# Patient Record
Sex: Male | Born: 1945 | Race: White | Hispanic: No | Marital: Married | Smoking: Former smoker
Health system: Southern US, Community
[De-identification: ages and names within clinical notes are randomized; demographics above are authoritative.]

## PROBLEM LIST (undated history)

## (undated) DIAGNOSIS — Z86010 Personal history of colonic polyps: Secondary | ICD-10-CM

## (undated) DIAGNOSIS — N289 Disorder of kidney and ureter, unspecified: Secondary | ICD-10-CM

## (undated) DIAGNOSIS — E785 Hyperlipidemia, unspecified: Secondary | ICD-10-CM

## (undated) DIAGNOSIS — Z992 Dependence on renal dialysis: Secondary | ICD-10-CM

## (undated) DIAGNOSIS — Z87442 Personal history of urinary calculi: Secondary | ICD-10-CM

## (undated) DIAGNOSIS — M1990 Unspecified osteoarthritis, unspecified site: Secondary | ICD-10-CM

## (undated) DIAGNOSIS — M14671 Charcot's joint, right ankle and foot: Secondary | ICD-10-CM

## (undated) DIAGNOSIS — N186 End stage renal disease: Secondary | ICD-10-CM

## (undated) DIAGNOSIS — H3581 Retinal edema: Secondary | ICD-10-CM

## (undated) DIAGNOSIS — Z9989 Dependence on other enabling machines and devices: Secondary | ICD-10-CM

## (undated) DIAGNOSIS — H3530 Unspecified macular degeneration: Secondary | ICD-10-CM

## (undated) DIAGNOSIS — G4733 Obstructive sleep apnea (adult) (pediatric): Secondary | ICD-10-CM

## (undated) DIAGNOSIS — H269 Unspecified cataract: Secondary | ICD-10-CM

## (undated) DIAGNOSIS — I480 Paroxysmal atrial fibrillation: Secondary | ICD-10-CM

## (undated) DIAGNOSIS — I509 Heart failure, unspecified: Secondary | ICD-10-CM

## (undated) DIAGNOSIS — IMO0001 Reserved for inherently not codable concepts without codable children: Secondary | ICD-10-CM

## (undated) DIAGNOSIS — R55 Syncope and collapse: Secondary | ICD-10-CM

## (undated) DIAGNOSIS — I5030 Unspecified diastolic (congestive) heart failure: Secondary | ICD-10-CM

## (undated) DIAGNOSIS — Q780 Osteogenesis imperfecta: Secondary | ICD-10-CM

## (undated) DIAGNOSIS — I2510 Atherosclerotic heart disease of native coronary artery without angina pectoris: Secondary | ICD-10-CM

## (undated) DIAGNOSIS — I10 Essential (primary) hypertension: Secondary | ICD-10-CM

## (undated) DIAGNOSIS — C801 Malignant (primary) neoplasm, unspecified: Secondary | ICD-10-CM

## (undated) HISTORY — DX: Heart failure, unspecified: I50.9

## (undated) HISTORY — PX: COLONOSCOPY: SHX174

## (undated) HISTORY — DX: Essential (primary) hypertension: I10

## (undated) HISTORY — DX: Personal history of colonic polyps: Z86.010

## (undated) HISTORY — DX: Osteogenesis imperfecta: Q78.0

## (undated) HISTORY — DX: Disorder of kidney and ureter, unspecified: N28.9

## (undated) HISTORY — DX: Charcot's joint, right ankle and foot: M14.671

## (undated) HISTORY — DX: Obstructive sleep apnea (adult) (pediatric): G47.33

## (undated) HISTORY — DX: Unspecified cataract: H26.9

## (undated) HISTORY — DX: Dependence on other enabling machines and devices: Z99.89

## (undated) HISTORY — PX: PILONIDAL CYST EXCISION: SHX744

## (undated) HISTORY — DX: Retinal edema: H35.81

## (undated) HISTORY — PX: CARPAL TUNNEL RELEASE: SHX101

## (undated) HISTORY — DX: Atherosclerotic heart disease of native coronary artery without angina pectoris: I25.10

## (undated) HISTORY — DX: Hyperlipidemia, unspecified: E78.5

## (undated) HISTORY — DX: Paroxysmal atrial fibrillation: I48.0

---

## 2003-01-28 ENCOUNTER — Encounter: Admission: RE | Admit: 2003-01-28 | Discharge: 2003-04-28 | Payer: Self-pay | Admitting: *Deleted

## 2005-08-29 ENCOUNTER — Ambulatory Visit: Payer: Self-pay | Admitting: Family Medicine

## 2005-10-16 ENCOUNTER — Ambulatory Visit: Payer: Self-pay | Admitting: Family Medicine

## 2005-11-23 ENCOUNTER — Encounter: Payer: Self-pay | Admitting: Family Medicine

## 2005-11-24 ENCOUNTER — Encounter: Payer: Self-pay | Admitting: Family Medicine

## 2005-11-24 LAB — CONVERTED CEMR LAB
HDL: 41 mg/dL
LDL Cholesterol: 48 mg/dL

## 2005-12-05 ENCOUNTER — Ambulatory Visit: Payer: Self-pay | Admitting: Family Medicine

## 2006-01-16 ENCOUNTER — Ambulatory Visit: Payer: Self-pay | Admitting: Family Medicine

## 2006-01-23 ENCOUNTER — Encounter: Payer: Self-pay | Admitting: Family Medicine

## 2006-01-23 LAB — CONVERTED CEMR LAB
RBC count: 4.89 10*6/uL
WBC, blood: 6.2 10*3/uL

## 2006-03-08 ENCOUNTER — Ambulatory Visit: Payer: Self-pay | Admitting: Family Medicine

## 2006-05-17 ENCOUNTER — Ambulatory Visit: Payer: Self-pay | Admitting: Family Medicine

## 2006-08-10 ENCOUNTER — Ambulatory Visit: Payer: Self-pay | Admitting: Family Medicine

## 2006-08-28 DIAGNOSIS — N184 Chronic kidney disease, stage 4 (severe): Secondary | ICD-10-CM

## 2006-08-28 DIAGNOSIS — I10 Essential (primary) hypertension: Secondary | ICD-10-CM | POA: Insufficient documentation

## 2006-08-28 DIAGNOSIS — E1122 Type 2 diabetes mellitus with diabetic chronic kidney disease: Secondary | ICD-10-CM | POA: Insufficient documentation

## 2006-09-13 ENCOUNTER — Ambulatory Visit: Payer: Self-pay | Admitting: Family Medicine

## 2006-09-19 ENCOUNTER — Encounter: Payer: Self-pay | Admitting: Family Medicine

## 2006-09-19 LAB — CONVERTED CEMR LAB
Creatinine, Ser: 1.58 mg/dL — ABNORMAL HIGH (ref 0.40–1.50)
Sodium, Ur: 104 meq/L
Sodium: 134 meq/L — ABNORMAL LOW (ref 135–145)

## 2006-10-02 ENCOUNTER — Encounter: Payer: Self-pay | Admitting: Family Medicine

## 2006-10-05 ENCOUNTER — Encounter: Payer: Self-pay | Admitting: Family Medicine

## 2006-10-05 DIAGNOSIS — G4733 Obstructive sleep apnea (adult) (pediatric): Secondary | ICD-10-CM | POA: Insufficient documentation

## 2006-10-08 ENCOUNTER — Encounter: Payer: Self-pay | Admitting: Family Medicine

## 2006-10-09 ENCOUNTER — Telehealth: Payer: Self-pay | Admitting: Family Medicine

## 2006-10-22 ENCOUNTER — Telehealth: Payer: Self-pay | Admitting: Family Medicine

## 2006-10-23 ENCOUNTER — Encounter: Payer: Self-pay | Admitting: Family Medicine

## 2006-10-23 DIAGNOSIS — R809 Proteinuria, unspecified: Secondary | ICD-10-CM | POA: Insufficient documentation

## 2006-10-23 DIAGNOSIS — N186 End stage renal disease: Secondary | ICD-10-CM | POA: Insufficient documentation

## 2007-02-20 ENCOUNTER — Ambulatory Visit: Payer: Self-pay | Admitting: Family Medicine

## 2007-02-20 LAB — CONVERTED CEMR LAB

## 2007-02-22 ENCOUNTER — Encounter: Payer: Self-pay | Admitting: Family Medicine

## 2007-02-22 LAB — CONVERTED CEMR LAB
CO2: 18 meq/L — ABNORMAL LOW (ref 19–32)
Calcium: 9.4 mg/dL (ref 8.4–10.5)
Cholesterol: 107 mg/dL (ref 0–200)
Glucose, Bld: 106 mg/dL — ABNORMAL HIGH (ref 70–99)
Sodium: 140 meq/L (ref 135–145)
Total Bilirubin: 2 mg/dL — ABNORMAL HIGH (ref 0.3–1.2)
Total Protein: 7.2 g/dL (ref 6.0–8.3)
Triglycerides: 81 mg/dL (ref <150)
VLDL: 16 mg/dL (ref 0–40)

## 2007-02-25 ENCOUNTER — Encounter: Payer: Self-pay | Admitting: Family Medicine

## 2007-03-28 ENCOUNTER — Encounter: Payer: Self-pay | Admitting: Family Medicine

## 2007-03-28 ENCOUNTER — Encounter: Admission: RE | Admit: 2007-03-28 | Discharge: 2007-06-26 | Payer: Self-pay | Admitting: Family Medicine

## 2007-04-30 ENCOUNTER — Telehealth: Payer: Self-pay | Admitting: Family Medicine

## 2007-05-28 ENCOUNTER — Encounter: Payer: Self-pay | Admitting: Family Medicine

## 2007-06-06 ENCOUNTER — Ambulatory Visit: Payer: Self-pay | Admitting: Family Medicine

## 2007-06-06 LAB — CONVERTED CEMR LAB: Hgb A1c MFr Bld: 8.4 %

## 2007-07-18 ENCOUNTER — Ambulatory Visit: Payer: Self-pay | Admitting: Family Medicine

## 2007-07-18 LAB — CONVERTED CEMR LAB: Blood Glucose, Fingerstick: 119

## 2007-09-19 ENCOUNTER — Ambulatory Visit: Payer: Self-pay | Admitting: Family Medicine

## 2007-09-19 LAB — CONVERTED CEMR LAB: Hgb A1c MFr Bld: 9 %

## 2007-11-25 ENCOUNTER — Telehealth: Payer: Self-pay | Admitting: Family Medicine

## 2007-11-29 ENCOUNTER — Telehealth: Payer: Self-pay | Admitting: Family Medicine

## 2008-04-23 ENCOUNTER — Ambulatory Visit: Payer: Self-pay | Admitting: Family Medicine

## 2008-04-23 LAB — CONVERTED CEMR LAB
Glucose, Bld: 186 mg/dL
Hgb A1c MFr Bld: 8.9 %

## 2008-05-21 ENCOUNTER — Ambulatory Visit: Payer: Self-pay | Admitting: Family Medicine

## 2008-05-25 LAB — CONVERTED CEMR LAB
BUN: 39 mg/dL — ABNORMAL HIGH (ref 6–23)
CO2: 21 meq/L (ref 19–32)
Cholesterol: 129 mg/dL (ref 0–200)
Creatinine, Ser: 1.69 mg/dL — ABNORMAL HIGH (ref 0.40–1.50)
Glucose, Bld: 138 mg/dL — ABNORMAL HIGH (ref 70–99)
Total Bilirubin: 1.1 mg/dL (ref 0.3–1.2)
Total CHOL/HDL Ratio: 3.7
Triglycerides: 114 mg/dL (ref <150)
VLDL: 23 mg/dL (ref 0–40)

## 2008-05-28 ENCOUNTER — Encounter: Payer: Self-pay | Admitting: Family Medicine

## 2008-06-08 ENCOUNTER — Telehealth: Payer: Self-pay | Admitting: Family Medicine

## 2008-06-12 ENCOUNTER — Encounter: Payer: Self-pay | Admitting: Family Medicine

## 2008-06-30 ENCOUNTER — Encounter: Payer: Self-pay | Admitting: Family Medicine

## 2008-12-28 ENCOUNTER — Telehealth: Payer: Self-pay | Admitting: Family Medicine

## 2009-01-05 ENCOUNTER — Telehealth: Payer: Self-pay | Admitting: Family Medicine

## 2009-01-22 ENCOUNTER — Ambulatory Visit: Payer: Self-pay | Admitting: Family Medicine

## 2009-10-20 ENCOUNTER — Ambulatory Visit: Payer: Self-pay | Admitting: Family Medicine

## 2009-10-20 LAB — CONVERTED CEMR LAB
Creatinine,U: 100 mg/dL
Hgb A1c MFr Bld: 8.5 %

## 2009-10-21 LAB — CONVERTED CEMR LAB
ALT: 13 units/L (ref 0–53)
AST: 16 units/L (ref 0–37)
Albumin: 4.1 g/dL (ref 3.5–5.2)
Alkaline Phosphatase: 69 units/L (ref 39–117)
Calcium: 9.1 mg/dL (ref 8.4–10.5)
Chloride: 105 meq/L (ref 96–112)
Creatinine, Ser: 1.45 mg/dL (ref 0.40–1.50)
LDL Cholesterol: 117 mg/dL — ABNORMAL HIGH (ref 0–99)
Potassium: 4.1 meq/L (ref 3.5–5.3)
Total CHOL/HDL Ratio: 5.6

## 2009-10-27 ENCOUNTER — Encounter: Payer: Self-pay | Admitting: Family Medicine

## 2009-12-01 ENCOUNTER — Ambulatory Visit: Payer: Self-pay | Admitting: Family Medicine

## 2010-02-16 ENCOUNTER — Ambulatory Visit: Payer: Self-pay | Admitting: Family Medicine

## 2010-02-16 LAB — CONVERTED CEMR LAB: Hgb A1c MFr Bld: 8.6 %

## 2010-03-18 ENCOUNTER — Ambulatory Visit: Payer: Self-pay | Admitting: Family Medicine

## 2010-08-24 ENCOUNTER — Ambulatory Visit: Payer: Self-pay | Admitting: Family Medicine

## 2010-08-24 LAB — CONVERTED CEMR LAB: Hgb A1c MFr Bld: 8.8 %

## 2010-12-02 ENCOUNTER — Telehealth: Payer: Self-pay | Admitting: Family Medicine
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
## 2010-12-22 NOTE — Progress Notes (Signed)
Summary: Pharmacy CHANGE and Need Novolog called in- jr  Phone Note Refill Request Message from:  Patient on December 02, 2010 10:34 AM  Refills Requested: Medication #1:  NOVOLOG 100 UNIT/ML SOLN 10 units Subcutaneously with each meal   Dosage confirmed as above?Dosage Confirmed Patient has "switched" his pharmacy to [facility] here in [place] and needs his Novolog called in today because he is out.   Initial call taken by: Otho Ket,  December 02, 2010 10:35 AM  Follow-up for Phone Call        called pt and per his wife needs to [facility] Follow-up by: Isaias Cowman CMA, Deborra Medina),  December 05, 2010 9:08 AM    Prescriptions: NOVOLOG 100 UNIT/ML SOLN (INSULIN ASPART) 10 units Subcutaneously with each meal  #30 x 1   Entered by:   Isaias Cowman CMA, ([place])   Authorized by:   [name] MD   Signed by:   Isaias Cowman CMA, Deborra Medina) on 12/06/2010   Method used:   Electronically to        [facility] (retail)       [address] [place], [place]  [address]       Ph: [phone]       Fax: [phone]   RxID:   [id number]
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
## 2010-12-30 ENCOUNTER — Telehealth: Payer: Self-pay | Admitting: Family Medicine

## 2011-01-06 ENCOUNTER — Telehealth: Payer: Self-pay | Admitting: Family Medicine

## 2011-01-11 NOTE — Progress Notes (Signed)
Summary: refill request  Phone Note Refill Request Message from:  Patient on December 30, 2010 9:28 AM  Refills Requested: Medication #1:  LEVEMIR 100 UNIT/ML  SOLN 40  units SQ  nightly.   Dosage confirmed as above?Dosage Confirmed Patient has SWITCHED his pharmacy to [facility] [address] in [place] and needs this med called in.Jennifer Rich  December 30, 2010 9:29 AM   Initial call taken by: Otho Ket,  December 30, 2010 9:29 AM    Prescriptions: LEVEMIR 100 UNIT/ML  SOLN (INSULIN DETEMIR) 40  units SQ  nightly.  #20 [place] x 1   Entered by:   Isaias Cowman CMA, ([place])   Authorized by:   [name] MD   Signed by:   Isaias Cowman CMA, ([place]) on 01/03/2011   Method used:   Electronically to        [facility] [phone]* (retail)       [place], [place]  [address]       Ph: [phone] or [phone]       Fax: [phone]   RxID:   [id number]

## 2011-01-17 NOTE — Progress Notes (Signed)
Summary: Referral request  Phone Note Call from Patient Call back at Home Phone [phone]   Caller: Spouse Reason for Call: Referral Summary of Call: Pt would like referral with Endrocronolgist Dr Lonna Duval [phone], needs appt for a Wednesday or Friday  Initial call taken by: Titus Dubin,  January 06, 2011 9:35 AM

## 2011-02-01 ENCOUNTER — Ambulatory Visit (INDEPENDENT_AMBULATORY_CARE_PROVIDER_SITE_OTHER): Payer: Medicare Other | Admitting: Family Medicine

## 2011-02-01 ENCOUNTER — Encounter: Payer: Self-pay | Admitting: Family Medicine

## 2011-02-01 DIAGNOSIS — I4510 Unspecified right bundle-branch block: Secondary | ICD-10-CM | POA: Insufficient documentation

## 2011-02-01 DIAGNOSIS — Z0000 Encounter for general adult medical examination without abnormal findings: Secondary | ICD-10-CM

## 2011-02-01 DIAGNOSIS — I10 Essential (primary) hypertension: Secondary | ICD-10-CM

## 2011-02-01 DIAGNOSIS — E119 Type 2 diabetes mellitus without complications: Secondary | ICD-10-CM

## 2011-02-02 LAB — CONVERTED CEMR LAB
Albumin: 4.2 g/dL (ref 3.5–5.2)
BUN: 36 mg/dL — ABNORMAL HIGH (ref 6–23)
CO2: 18 meq/L — ABNORMAL LOW (ref 19–32)
Calcium: 9.3 mg/dL (ref 8.4–10.5)
Chloride: 106 meq/L (ref 96–112)
Cholesterol: 241 mg/dL — ABNORMAL HIGH (ref 0–200)
Glucose, Bld: 74 mg/dL (ref 70–99)
HDL: 43 mg/dL (ref >39)
Potassium: 4.4 meq/L (ref 3.5–5.3)
TSH: 1.558 microintl units/mL (ref 0.350–4.500)
Triglycerides: 176 mg/dL — ABNORMAL HIGH (ref <150)

## 2011-02-07 ENCOUNTER — Encounter: Payer: Self-pay | Admitting: Cardiology
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
## 2011-02-08 ENCOUNTER — Encounter: Payer: Self-pay | Admitting: Cardiology

## 2011-02-08 ENCOUNTER — Ambulatory Visit: Payer: Medicare Other | Admitting: Cardiology

## 2011-02-08 ENCOUNTER — Ambulatory Visit (INDEPENDENT_AMBULATORY_CARE_PROVIDER_SITE_OTHER): Payer: Medicare Other | Admitting: Cardiology

## 2011-02-08 ENCOUNTER — Ambulatory Visit (INDEPENDENT_AMBULATORY_CARE_PROVIDER_SITE_OTHER): Payer: Medicare Other | Admitting: Family Medicine

## 2011-02-08 VITALS — BP 114/63 | HR 50

## 2011-02-08 DIAGNOSIS — M79606 Pain in leg, unspecified: Secondary | ICD-10-CM

## 2011-02-08 DIAGNOSIS — R9431 Abnormal electrocardiogram [ECG] [EKG]: Secondary | ICD-10-CM | POA: Insufficient documentation

## 2011-02-08 DIAGNOSIS — M79609 Pain in unspecified limb: Secondary | ICD-10-CM

## 2011-02-08 DIAGNOSIS — I498 Other specified cardiac arrhythmias: Secondary | ICD-10-CM

## 2011-02-08 DIAGNOSIS — M25561 Pain in right knee: Secondary | ICD-10-CM | POA: Insufficient documentation

## 2011-02-08 DIAGNOSIS — I10 Essential (primary) hypertension: Secondary | ICD-10-CM

## 2011-02-08 DIAGNOSIS — R001 Bradycardia, unspecified: Secondary | ICD-10-CM | POA: Insufficient documentation

## 2011-02-08 DIAGNOSIS — E785 Hyperlipidemia, unspecified: Secondary | ICD-10-CM | POA: Insufficient documentation

## 2011-02-08 DIAGNOSIS — E119 Type 2 diabetes mellitus without complications: Secondary | ICD-10-CM | POA: Insufficient documentation

## 2011-02-08 MED ORDER — METOPROLOL SUCCINATE ER 100 MG PO TB24: ORAL_TABLET | ORAL | Status: DC

## 2011-02-08 NOTE — Progress Notes (Signed)
HPI: 65 year old male with no prior cardiac history for evaluation of abnormal electrocardiogram. Recently noted to have an abnormal electrocardiogram during routine physical. The patient denies dyspnea on exertion, orthopnea, PND, palpitations, history of syncope or chest pain. He does have mild pedal edema which is a chronic issue. There is no family history of sudden death. He does note fatigue.  Current Outpatient Prescriptions  Medication Sig Dispense Refill  . aspirin 81 MG tablet Take 81 mg by mouth daily.        . cloNIDine (CATAPRES) 0.3 MG tablet Take 0.3 mg by mouth 2 (two) times daily.        . insulin aspart (NOVOLOG) 100 UNIT/ML injection Inject into the skin 3 (three) times daily before meals.        . insulin detemir (LEVEMIR) 100 UNIT/ML injection Inject into the skin at bedtime.        . metFORMIN (GLUCOPHAGE) 1000 MG tablet Take 1,000 mg by mouth 2 (two) times daily with a meal.        . metoprolol (TOPROL-XL) 200 MG 24 hr tablet Take 200 mg by mouth daily.        . Olmesartan-Amlodipine-HCTZ (TRIBENZOR) 40-5-25 MG TABS Take by mouth.        . quinapril (ACCUPRIL) 40 MG tablet Take 40 mg by mouth at bedtime.        . rosuvastatin (CRESTOR) 40 MG tablet Take 40 mg by mouth daily.        . vardenafil (LEVITRA) 10 MG tablet Take 10 mg by mouth daily as needed.        Marland Kitchen DISCONTD: rosuvastatin (CRESTOR) 40 MG tablet Take 40 mg by mouth daily.          No Known Allergies  Past Medical History  Diagnosis Date  . Diabetes mellitus   . Hypertension   . Hyperlipidemia   . OSA on CPAP   . Renal insufficiency     Past Surgical History  Procedure Date  . Pilonidal cyst excision     History   Social History  . Marital Status: Married    Spouse Name: N/A    Number of Children: 92  . Years of Education: N/A   Occupational History  . Warehouse    Social History Main Topics  . Smoking status: Former Smoker -- 1.0 packs/day for 20 years    Types: Cigarettes  . Smokeless  tobacco: Not on file  . Alcohol Use: No  . Drug Use: No  . Sexually Active: Not on file   Other Topics Concern  . Not on file   Social History Narrative  . No narrative on file    Family History  Problem Relation Age of Onset  . Diabetes Mother   . Coronary artery disease Father 25    CABG  . Diabetes Father     ROS: Fatigue and arthralgias but no fevers or chills, productive cough, hemoptysis, dysphasia, odynophagia, melena, hematochezia, dysuria, hematuria, rash, seizure activity, orthopnea, PND, claudication. Remaining systems are negative.  Physical Exam: General:  Well developed/obese in NAD Skin warm/dry Patient not depressed No peripheral clubbing Back-normal HEENT-normal/normal eyelids Neck supple/normal carotid upstroke bilaterally; no bruits; no JVD; no thyromegaly chest - CTA/ normal expansion CV - RRR/normal S1 and S2; no murmurs, rubs or gallops;  PMI nondisplaced Abdomen -NT/ND, no HSM, no mass, + bowel sounds, no bruit 2+ femoral pulses, no bruits Ext-1+ edema, no chords, 1+ DP Neuro-grossly nonfocal  ECG Marked sinus bradycardia at  a rate of 48. Inferolateral T-wave inversion.

## 2011-02-08 NOTE — Assessment & Plan Note (Signed)
Heart rate mildly decreased. Decreased Toprol to 150 mg daily.

## 2011-02-08 NOTE — Assessment & Plan Note (Signed)
Continue statin. Lipids and liver monitored by primary care. 

## 2011-02-08 NOTE — Assessment & Plan Note (Signed)
Patient has marked sinus bradycardia with marked inferior lateral T-wave inversion. He is not having significant symptoms other than fatigue. However he has multiple risk factors including 25 years of diabetes mellitus. Plan stress Myoview to exclude ischemia. His electrocardiogram could also be consistent with hypertrophic cardiomyopathy. I will proceed with an echocardiogram to further evaluate.

## 2011-02-08 NOTE — Patient Instructions (Signed)
Your physician has requested that you have a lower extremity arterial duplex. This test is an ultrasound of the arteries in the legs or arms. It looks at arterial blood flow in the legs and arms. Allow one hour for Lower and Upper Arterial scans. There are no restrictions or special instructions Your physician has requested that you have an echocardiogram. Echocardiography is a painless test that uses sound waves to create images of your heart. It provides your doctor with information about the size and shape of your heart and how well your heart's chambers and valves are working. This procedure takes approximately one hour. There are no restrictions for this procedure.  Your physician has requested that you have a lexiscan myoview. For further information please visit [email or web address]. Please follow instruction sheet, as given.  Your physician has recommended you make the following change in your medication: DECREASE METOPROLOL TO 100MG  TAKE ONE AND ONE HALF TABLETS ONCE DAILY Your physician recommends that you schedule a follow-up appointment in: 6 WEEKS

## 2011-02-08 NOTE — Assessment & Plan Note (Signed)
Blood pressure controlled. Continue present medications. 

## 2011-02-08 NOTE — Assessment & Plan Note (Signed)
Sounds to be arthralgias. However risk factors for vascular disease and diminished pulses distally. Schedule ABI.

## 2011-02-14 ENCOUNTER — Other Ambulatory Visit: Payer: Self-pay | Admitting: Family Medicine

## 2011-02-16 NOTE — Progress Notes (Signed)
  Subjective:    Patient ID: Louis [name], male    DOB: [birth year], 65 y.o.   MRN: [id number]  HPI Here for BP check.    Review of Systems     Objective:   Physical Exam        Assessment & Plan:  HTN- BP looks great today. No changes in the regimen. F/U in 3 months.  Notified pt's wife and she will let pt know to f/u in 3 months

## 2011-02-17 ENCOUNTER — Telehealth: Payer: Self-pay | Admitting: Family Medicine

## 2011-02-17 ENCOUNTER — Encounter: Payer: Self-pay | Admitting: Family Medicine

## 2011-02-17 MED ORDER — INSULIN ASPART 100 UNIT/ML ~~LOC~~ SOLN: SUBCUTANEOUS | Status: DC

## 2011-02-17 MED ORDER — QUINAPRIL HCL 40 MG PO TABS: 40.0000 mg | ORAL_TABLET | Freq: Every day | ORAL | Status: DC

## 2011-02-17 NOTE — Telephone Encounter (Signed)
Pt would like all refills to be sent to [facility], pt needs quinapril and novolog filled now

## 2011-02-22 ENCOUNTER — Ambulatory Visit (HOSPITAL_BASED_OUTPATIENT_CLINIC_OR_DEPARTMENT_OTHER): Payer: Medicare Other | Admitting: Radiology

## 2011-02-22 ENCOUNTER — Ambulatory Visit (HOSPITAL_COMMUNITY): Payer: Medicare Other | Attending: Cardiology | Admitting: Radiology

## 2011-02-22 ENCOUNTER — Encounter (INDEPENDENT_AMBULATORY_CARE_PROVIDER_SITE_OTHER): Payer: Medicare Other | Admitting: Cardiology

## 2011-02-22 DIAGNOSIS — R9431 Abnormal electrocardiogram [ECG] [EKG]: Secondary | ICD-10-CM

## 2011-02-22 DIAGNOSIS — E1159 Type 2 diabetes mellitus with other circulatory complications: Secondary | ICD-10-CM

## 2011-02-22 DIAGNOSIS — M79606 Pain in leg, unspecified: Secondary | ICD-10-CM

## 2011-02-22 DIAGNOSIS — E109 Type 1 diabetes mellitus without complications: Secondary | ICD-10-CM

## 2011-02-22 MED ORDER — TECHNETIUM TC 99M TETROFOSMIN IV KIT
10.9000 | PACK | Freq: Once | INTRAVENOUS | Status: AC | PRN
  Administered 2011-02-22: 10.9 via INTRAVENOUS

## 2011-02-22 MED ORDER — REGADENOSON 0.4 MG/5ML IV SOLN
0.4000 mg | Freq: Once | INTRAVENOUS | Status: AC
  Administered 2011-02-22: 0.4 mg via INTRAVENOUS

## 2011-02-22 MED ORDER — TECHNETIUM TC 99M TETROFOSMIN IV KIT
33.0000 | PACK | Freq: Once | INTRAVENOUS | Status: AC | PRN
  Administered 2011-02-22: 33 via INTRAVENOUS

## 2011-02-22 NOTE — Progress Notes (Signed)
ABNORMAL NUCLEAR REPORT ROUTED TO DR. CRENSHAW AND DEBRA MATHIS. Breianna Delfino, Tarri Glenn

## 2011-02-22 NOTE — Progress Notes (Signed)
[place] [address] [phone]  Cardiology Nuclear Med Study  Louis [name] is a 65 y.o. male [id number] 1946/05/11   Nuclear Med Background Indication for Stress Test:  Evaluation for Ischemia and Abnormal EKG History:  15-20 yrs. Ago GXT:OK per patient Cardiac Risk Factors: Family History - CAD, History of Smoking, Hypertension, IDDM Type 2, Lipids and Obesity  Symptoms:  Fatigue   Nuclear Pre-Procedure Caffeine/Decaff Intake:  None NPO After: 10:30pm   Lungs:  Clear.  O2 sat 98% on RA IV 0.9% NS with Angio Cath:  20g  IV Site: R Antecubital  IV Started by:  Eliezer Lofts, EMT-P  Chest Size (in):  50 Cup Size: n/a  BMI:  Body mass index is 38.01 kg/(m^2). Tech Comments:  CBG= 197 @ 7 am, Metoprolol held x 24 hours, per patient.    Nuclear Med Study 1 or 2 day study: 1 day  Stress Test Type:  Lexiscan  Reading MD: Dola Argyle, MD  Order Authorizing Provider:  [name], MD  Resting Radionuclide: Technetium 73m Tetrofosmin  Resting Radionuclide Dose: 10.9 mCi   Stress Radionuclide:  Technetium 87m Tetrofosmin  Stress Radionuclide Dose: 33 mCi           Stress Protocol Rest HR: 61 Stress HR: 85  Rest BP: 198/115 Stress BP: 198/99  Exercise Time (min): n/a METS: n/a   Predicted Max HR: 155 bpm % Max HR: 54.84 bpm Rate Pressure Product: 16830   Dose of Adenosine (mg):  n/a Dose of Lexiscan: 0.4 mg  Dose of Atropine (mg): n/a Dose of Dobutamine: n/a mcg/kg/min (at max HR)  Stress Test Technologist: Valetta Fuller, CMA-N  Nuclear Technologist:  Charlton Amor, CNMT     Rest Procedure:  Myocardial perfusion imaging was performed at rest 45 minutes following the intravenous administration of Technetium 53m Tetrofosmin. Rest ECG: Nonspecific T-wave changes.  Stress Procedure:  The patient received IV Lexiscan 0.4 mg over 15-seconds.  Technetium 59m Tetrofosmin injected at 30-seconds.  There were no  significant changes with Lexiscan, other than occasional PVC's and rare PAC's.  Quantitative spect images were obtained after a 45 minute delay. Stress ECG: No significant change from baseline ECG  QPS Raw Data Images:  Normal; no motion artifact; normal heart/lung ratio. Stress Images:  Moderate decreased activity at the apex Rest Images:  Normal homogeneous uptake in all areas of the myocardium. Subtraction (SDS):  There is complete reversibility at the apex Transient Ischemic Dilatation (Normal <1.22):  1.14 Lung/Heart Ratio (Normal <0.45):  0.20  Quantitative Gated Spect Images QGS EDV:  118 ml QGS ESV:  48 ml QGS cine images:  hypokinesis at the apex QGS EF: 59%  Impression Exercise Capacity:  Lexiscan with no exercise. BP Response:  Hypertensive blood pressure response. Clinical Symptoms:  Felt  "juiced up" ECG Impression:  No significant ST segment change suggestive of ischemia. Comparison with Prior Nuclear Study: No previous nuclear study performed  Overall Impression:  There is decrease activity at the apex that is more marked than we see with apical thinining. This area reverses. There is some hypokinesis at the apex. This data suggests ischemia at the apex.      Dola Argyle

## 2011-02-23 ENCOUNTER — Encounter (HOSPITAL_COMMUNITY): Payer: Self-pay | Admitting: Cardiology

## 2011-02-23 ENCOUNTER — Telehealth: Payer: Self-pay | Admitting: *Deleted

## 2011-02-23 ENCOUNTER — Encounter: Payer: Self-pay | Admitting: Cardiology

## 2011-02-23 NOTE — Telephone Encounter (Signed)
Message copied by Fredia Beets on Thu Feb 23, 2011 11:30 AM ------      Message from: [name]      Created: Wed Feb 22, 2011  8:04 PM       ok

## 2011-02-23 NOTE — Telephone Encounter (Signed)
pt aware of echo results Louis [name]

## 2011-02-23 NOTE — Progress Notes (Signed)
pt aware of results, appt scheduled Fredia Beets

## 2011-02-27 ENCOUNTER — Encounter: Payer: Self-pay | Admitting: Cardiology

## 2011-03-01 ENCOUNTER — Encounter: Payer: Self-pay | Admitting: Cardiology

## 2011-03-01 ENCOUNTER — Ambulatory Visit (INDEPENDENT_AMBULATORY_CARE_PROVIDER_SITE_OTHER): Payer: Medicare Other | Admitting: Cardiology

## 2011-03-01 DIAGNOSIS — Z6832 Body mass index (BMI) 32.0-32.9, adult: Secondary | ICD-10-CM | POA: Insufficient documentation

## 2011-03-01 DIAGNOSIS — E785 Hyperlipidemia, unspecified: Secondary | ICD-10-CM

## 2011-03-01 DIAGNOSIS — N183 Chronic kidney disease, stage 3 unspecified: Secondary | ICD-10-CM

## 2011-03-01 DIAGNOSIS — R9430 Abnormal result of cardiovascular function study, unspecified: Secondary | ICD-10-CM

## 2011-03-01 DIAGNOSIS — E119 Type 2 diabetes mellitus without complications: Secondary | ICD-10-CM

## 2011-03-01 DIAGNOSIS — I10 Essential (primary) hypertension: Secondary | ICD-10-CM

## 2011-03-01 NOTE — Assessment & Plan Note (Signed)
Patient will most likely need to be followed by nephrology in the future.

## 2011-03-01 NOTE — Assessment & Plan Note (Signed)
Management per primary care. 

## 2011-03-01 NOTE — Progress Notes (Signed)
HPI: 65 year old male I saw him recently for an abnormal electrocardiogram. Myoview performed in April 2012 revealed apical ischemia and an ejection fraction of 56%. Echocardiogram in April of 2012 showed normal LV function and mild left ventricular hypertrophy. ABIs were normal. Since I last saw him, he has dyspnea with more extreme activities but not with routine activities. There is no orthopnea or PND but there is occasional pedal edema. There is no chest pain or syncope.  Current Outpatient Prescriptions  Medication Sig Dispense Refill  . aspirin 81 MG tablet Take 81 mg by mouth daily.        . cloNIDine (CATAPRES) 0.3 MG tablet Take 0.3 mg by mouth 2 (two) times daily.        . insulin aspart (NOVOLOG) 100 UNIT/ML injection Inject per SSI with each meal.  20 mL  6  . insulin detemir (LEVEMIR) 100 UNIT/ML injection Inject into the skin at bedtime.        . metFORMIN (GLUCOPHAGE) 1000 MG tablet Take 1,000 mg by mouth 2 (two) times daily with a meal.        . metoprolol (TOPROL-XL) 100 MG 24 hr tablet Take one and one half tablet once daily  45 tablet  12  . Olmesartan-Amlodipine-HCTZ (TRIBENZOR) 40-5-25 MG TABS Take by mouth.        . quinapril (ACCUPRIL) 40 MG tablet Take 1 tablet (40 mg total) by mouth daily.  30 tablet  6  . rosuvastatin (CRESTOR) 40 MG tablet Take 40 mg by mouth daily.        . vardenafil (LEVITRA) 10 MG tablet Take 10 mg by mouth daily as needed.           Past Medical History  Diagnosis Date  . Diabetes mellitus   . Hypertension   . Hyperlipidemia   . OSA on CPAP   . Renal insufficiency     Past Surgical History  Procedure Date  . Pilonidal cyst excision     History   Social History  . Marital Status: Married    Spouse Name: N/A    Number of Children: 32  . Years of Education: N/A   Occupational History  . Warehouse    Social History Main Topics  . Smoking status: Former Smoker -- 1.0 packs/day for 20 years    Types: Cigarettes  . Smokeless  tobacco: Not on file  . Alcohol Use: No  . Drug Use: No  . Sexually Active: Not on file   Other Topics Concern  . Not on file   Social History Narrative  . No narrative on file    ROS: Arthralgias but no fevers or chills, productive cough, hemoptysis, dysphasia, odynophagia, melena, hematochezia, dysuria, hematuria, rash, seizure activity, orthopnea, PND, claudication. Remaining systems are negative.  Physical Exam: Well-developed obese in no acute distress.  Skin is warm and dry.  HEENT is normal.  Neck is supple. No thyromegaly.  Chest is clear to auscultation with normal expansion.  Cardiovascular exam is regular rate and rhythm.  Abdominal exam nontender or distended. No masses palpated. Extremities show trace edema. neuro grossly intact

## 2011-03-01 NOTE — Assessment & Plan Note (Signed)
Statin recently initiated.

## 2011-03-01 NOTE — Assessment & Plan Note (Signed)
Blood pressure controlled. Continue present medications. 

## 2011-03-01 NOTE — Assessment & Plan Note (Signed)
Patient's Myoview shows apical ischemia. He has multiple risk factors including 25 years of diabetes mellitus. He has some dyspnea on exertion. I think definitive evaluation is warranted. We will proceed with a cardiac catheterization. The risks and benefits have been discussed and the patient agrees to proceed. He does have renal insufficiency. I will admit him tonight prior to the procedure and hydrate with saline. We will plan coronaries only and no V. Gram. He will need close followup of his renal insufficiency after the procedure.

## 2011-03-06 ENCOUNTER — Telehealth: Payer: Self-pay | Admitting: Cardiology

## 2011-03-06 NOTE — Telephone Encounter (Signed)
Pt Signed ROI,faxed to me I faxed Cardiac records over to [facility] @ [id number] 03/06/11/KM

## 2011-03-06 NOTE — Telephone Encounter (Signed)
[name], needs to see if pt allergic to dye prior to cath on Wednesday

## 2011-03-07 ENCOUNTER — Inpatient Hospital Stay (HOSPITAL_COMMUNITY)
Admission: AD | Admit: 2011-03-07 | Discharge: 2011-03-09 | DRG: 287 | Disposition: A | Discharge status: Home / Self Care | Payer: Medicare Other | Source: Ambulatory Visit | Attending: Cardiology | Admitting: Cardiology

## 2011-03-07 DIAGNOSIS — I2582 Chronic total occlusion of coronary artery: Secondary | ICD-10-CM | POA: Diagnosis present

## 2011-03-07 DIAGNOSIS — I5032 Chronic diastolic (congestive) heart failure: Secondary | ICD-10-CM | POA: Diagnosis present

## 2011-03-07 DIAGNOSIS — I509 Heart failure, unspecified: Secondary | ICD-10-CM | POA: Diagnosis present

## 2011-03-07 DIAGNOSIS — E119 Type 2 diabetes mellitus without complications: Secondary | ICD-10-CM | POA: Diagnosis present

## 2011-03-07 DIAGNOSIS — I2510 Atherosclerotic heart disease of native coronary artery without angina pectoris: Principal | ICD-10-CM | POA: Diagnosis present

## 2011-03-07 DIAGNOSIS — G4733 Obstructive sleep apnea (adult) (pediatric): Secondary | ICD-10-CM | POA: Diagnosis present

## 2011-03-07 DIAGNOSIS — N183 Chronic kidney disease, stage 3 unspecified: Secondary | ICD-10-CM | POA: Diagnosis present

## 2011-03-07 DIAGNOSIS — N289 Disorder of kidney and ureter, unspecified: Secondary | ICD-10-CM | POA: Diagnosis present

## 2011-03-07 DIAGNOSIS — E785 Hyperlipidemia, unspecified: Secondary | ICD-10-CM | POA: Diagnosis present

## 2011-03-07 DIAGNOSIS — Z7982 Long term (current) use of aspirin: Secondary | ICD-10-CM

## 2011-03-07 DIAGNOSIS — I129 Hypertensive chronic kidney disease with stage 1 through stage 4 chronic kidney disease, or unspecified chronic kidney disease: Secondary | ICD-10-CM | POA: Diagnosis present

## 2011-03-07 DIAGNOSIS — Z794 Long term (current) use of insulin: Secondary | ICD-10-CM

## 2011-03-07 LAB — GLUCOSE, CAPILLARY

## 2011-03-07 LAB — BASIC METABOLIC PANEL
BUN: 34 mg/dL — ABNORMAL HIGH (ref 6–23)
CO2: 25 mEq/L (ref 19–32)
GFR calc Af Amer: 46 mL/min — ABNORMAL LOW (ref >60)
Glucose, Bld: 150 mg/dL — ABNORMAL HIGH (ref 70–99)
Potassium: 4.4 mEq/L (ref 3.5–5.1)
Sodium: 134 mEq/L — ABNORMAL LOW (ref 135–145)

## 2011-03-07 LAB — PROTIME-INR
INR: 0.92 (ref 0.00–1.49)
Prothrombin Time: 12.6 seconds (ref 11.6–15.2)

## 2011-03-07 LAB — CBC
HCT: 41 % (ref 39.0–52.0)
Platelets: 217 10*3/uL (ref 150–400)
RDW: 13.1 % (ref 11.5–15.5)
WBC: 8.1 10*3/uL (ref 4.0–10.5)

## 2011-03-08 ENCOUNTER — Ambulatory Visit (HOSPITAL_COMMUNITY): Admission: RE | Admit: 2011-03-08 | Payer: Medicare Other | Source: Ambulatory Visit | Admitting: Cardiology

## 2011-03-08 DIAGNOSIS — N289 Disorder of kidney and ureter, unspecified: Secondary | ICD-10-CM

## 2011-03-08 DIAGNOSIS — I2510 Atherosclerotic heart disease of native coronary artery without angina pectoris: Secondary | ICD-10-CM

## 2011-03-08 LAB — BASIC METABOLIC PANEL
BUN: 32 mg/dL — ABNORMAL HIGH (ref 6–23)
CO2: 27 mEq/L (ref 19–32)
Chloride: 104 mEq/L (ref 96–112)
Creatinine, Ser: 1.84 mg/dL — ABNORMAL HIGH (ref 0.4–1.5)
Glucose, Bld: 184 mg/dL — ABNORMAL HIGH (ref 70–99)
Potassium: 4.5 mEq/L (ref 3.5–5.1)

## 2011-03-08 LAB — GLUCOSE, CAPILLARY
Glucose-Capillary: 230 mg/dL — ABNORMAL HIGH (ref 70–99)
Glucose-Capillary: 186 mg/dL — ABNORMAL HIGH (ref 70–99)

## 2011-03-09 LAB — GLUCOSE, CAPILLARY
Glucose-Capillary: 207 mg/dL — ABNORMAL HIGH (ref 70–99)
Glucose-Capillary: 133 mg/dL — ABNORMAL HIGH (ref 70–99)

## 2011-03-09 LAB — BASIC METABOLIC PANEL
BUN: 30 mg/dL — ABNORMAL HIGH (ref 6–23)
CO2: 25 mEq/L (ref 19–32)
Chloride: 105 mEq/L (ref 96–112)
Glucose, Bld: 212 mg/dL — ABNORMAL HIGH (ref 70–99)
Potassium: 4.3 mEq/L (ref 3.5–5.1)

## 2011-03-10 ENCOUNTER — Other Ambulatory Visit (INDEPENDENT_AMBULATORY_CARE_PROVIDER_SITE_OTHER): Payer: Medicare Other | Admitting: *Deleted

## 2011-03-10 DIAGNOSIS — N183 Chronic kidney disease, stage 3 unspecified: Secondary | ICD-10-CM

## 2011-03-10 LAB — BASIC METABOLIC PANEL
Chloride: 102 mEq/L (ref 96–112)
GFR: 38.9 mL/min — ABNORMAL LOW (ref >60.00)
Glucose, Bld: 170 mg/dL — ABNORMAL HIGH (ref 70–99)
Potassium: 4.8 mEq/L (ref 3.5–5.1)
Sodium: 135 mEq/L (ref 135–145)

## 2011-03-14 ENCOUNTER — Telehealth: Payer: Self-pay | Admitting: Cardiology

## 2011-03-14 NOTE — Telephone Encounter (Signed)
Referral paperwork faxed to [place] [facility], pt wife aware Louis [name]

## 2011-03-14 NOTE — Telephone Encounter (Signed)
Pt rtn call 

## 2011-03-20 NOTE — Discharge Summary (Signed)
NAME:  Louis [name], Louis [name] NO.:  [id number]  MEDICAL RECORD NO.:  [id number]           PATIENT TYPE:  I  LOCATION:  [id number]                         FACILITY:  [facility]  PHYSICIAN:  Denice Bors. Stanford Breed, MD, FACCDATE OF BIRTH:  05-13-1946  DATE OF ADMISSION:  03/07/2011 DATE OF DISCHARGE:  03/09/2011                              DISCHARGE SUMMARY   DISCHARGE DIAGNOSES: 1. Coronary artery disease.     a.     Cardiac catheterization March 08, 2011:  Severe single-      vessel coronary artery disease with 100% occlusion of mid left      anterior descending which fills through very faint collaterals.      Moderate left main disease.  Severely elevated left ventricular      end-diastolic pressures.  Not suitable for percutaneous coronary      intervention or coronary artery bypass graft.  Medical therapy      recommended. 2. Acute-on-chronic renal insufficiency (CKD stage III).     a.     Creatinine on admission 1.79, peak creatinine 1.84, last      check BUN 30, creatinine 1.70.  BMET scheduled for April 20 at      [facility].     b.     [employer].  Phone number given to the      patient with instructions to call for an appointment in next 1-4      weeks.  SECONDARY DIAGNOSES: 1. Insulin-dependent diabetes mellitus. 2. Hypertension. 3. Hyperlipidemia. 4. Obstructive sleep apnea, on CPAP.  ALLERGIES:  NKDA.  PROCEDURES:  Cardiac catheterization March 08, 2011:  Obtuse marginal normal in size with mild calcification and 50% distal stenosis. left anterior descending, normal in size, moderately calcified throughout its course with mid diffuse 20% disease proximally and 100% occlusion in mid segment after second diagonal and septal branch, fills through very faint collaterals, appears to be diffusely diseased.  D1 small size branch was 60% ostial stenosis, D2 relatively large size branch with 40%- 50% proximal disease, D3 to have 2  branches and subtotally occluded. Left circumflex artery normal in size, nondominant, diffuse 40% disease in mid segment for take off of OM-3.  OM-1 and 2 are very small branches overall.  OM-3 free of significant disease.  RCA large dominant vessel with 30% proximal discrete stenosis, 20% mid stenosis and 30% distal stenosis before bifurcation.  PDA normal in size, free of significant disease.  Large posterolateral branch also free of significant disease. No LV gram secondary to renal insufficiency.  HISTORY OF PRESENT ILLNESS:  Louis [name] is a 65 year old gentleman with the above-noted complex medical history who had an abnormal nuclear stress test showing evidence of apical ischemia and set up for diagnostic cardiac catheterization.  The patient was admitted on day prior to procedure for IV hydration secondary to known chronic kidney disease, stage III.  HOSPITAL COURSE:  The patient was admitted and underwent procedures as described above.  He tolerated them well without any significant complications.  The patient was kept overnight after his procedure and seen by his primary cardiologist.  He was deemed stable for discharge with meds as outlined in the discharge med section and followup as outlined in the followup section.  At the time of his discharge, the patient received his new medication list, prescriptions, followup instructions, and postcath instructions.  All questions and concerns were addressed prior to leaving the hospital..  DISCHARGE LABORATORY FINDINGS:  WBC is 8.1, HGB 14.4, HCT 41.0, PLT count is 217.  Protime 12.6, INR 0.2.  Sodium 134, potassium 4.3, chloride 105, bicarb 25, BUN 30, creatinine 1.70, calcium 9.0.  FOLLOWUP PLANS AND APPOINTMENTS: 1. Basic metabolic panel [facility] March 10, 2011, at 10:30 a.m. 2. [place] [facility] Apr 12, 2011, at 2:15 p.m. 3. [facility] 1-4 weeks (the patient to contact to     setup appointment).  DISCHARGE MEDICATIONS: 1. Amlodipine 5 mg p.o. daily. 2. Sublingual nitroglycerin 0.4 mg every 5 minutes up to 3 doses     p.r.n. for chest discomfort. 3. Metoprolol succinate 100 mg 1.5 tablets p.o. daily. 4. Enteric-coated aspirin 81 mg p.o. daily. 5. Clonidine 0.3 mg 1 tablet p.o. b.i.d. 6. Crestor 40 mg 1 tablet p.o. daily. 7. CoQ10 1 capsule p.o. daily. 8. Fish oil 1 capsule daily. 9. Glucosamine 1 capsule daily. 10.Levemir 50 units subcu injection nightly. 11.Metformin 1 g p.o. b.i.d. (resume on March 11, 2011). 12.Multivitamin 1 tablet daily. 13.NovoLog 15 units subcu injection t.i.d. with meals. 14.Quinapril 40 mg 1 tablet p.o. daily. 15.Tribenzor 40/5/25 mg 1 tablet p.o. daily.  DURATION OF DISCHARGE ENCOUNTER INCLUDING PHYSICIAN TIME:  35 minutes.     Guss Bunde, PAC   ______________________________ Denice Bors. Stanford Breed, MD, [facility]    MS/MEDQ  D:  03/09/2011  T:  03/10/2011  Job:  [id number]  Electronically Signed by Guss Bunde PAC on 03/17/2011 02:14:00 PM Electronically Signed by [name] MD [facility] on 03/20/2011 07:24:44 PM

## 2011-03-22 ENCOUNTER — Telehealth: Payer: Self-pay | Admitting: Cardiology

## 2011-03-22 NOTE — Cardiovascular Report (Signed)
NAME:  LEITH, SEARCH NO.:  [id number]  MEDICAL RECORD NO.:  [id number]           PATIENT TYPE:  I  LOCATION:  [id number]                         FACILITY:  [facility]  PHYSICIAN:  Kathlyn Sacramento, MD     DATE OF BIRTH:  [birth year]  DATE OF PROCEDURE: DATE OF DISCHARGE:                           CARDIAC CATHETERIZATION   PROCEDURES PERFORMED: 1. Left heart catheterization. 2. Coronary angiography.  INDICATIONS AND CLINICAL HISTORY:  This is a 65 year old gentleman with no previous cardiac history.  He has risk factors include hypertension, type 2 diabetes, and chronic kidney disease.  He was evaluated for dyspnea.  He had a nuclear stress test which showed evidence of apical ischemia with normal ejection fraction.  Due to his symptoms and abnormal stress test, cardiac catheterization was recommended.  Due to his significant risk of contrast-induced nephropathy, the patient was admitted for IV hydration.  Left ventricular angiography was not planned due to this reason.  ACCESS:  Right femoral artery.  CONTRAST USED:  40 mL.  STUDY DETAILS:  Standard informed consent was obtained.  The right femoral area was prepped in a sterile fashion.  It was anesthetized with 1% lidocaine.  A 5-French sheath was placed in the right femoral artery after an anterior puncture.  Coronary angiography was performed with a JL-4, JR-4, and pigtail catheter.  All catheter exchanges were done over the wire.  STUDY FINDINGS:  Hemodynamic findings:  Left ventricular pressure was 187/21 with left ventricular end-diastolic pressure of 34 mmHg.  Aortic pressure was 180/86 with a mean pressure of 119 mmHg.  No significant gradient was noted across the aortic valve.  Left ventricular angiography was not performed due to chronic kidney disease.  CORONARY ANGIOGRAPHY:  Left main coronary artery:  The vessel was normal in size and mildly calcified.  There is a 50% distal stenosis. Left anterior  descending artery:  The vessel was normal in size and moderately calcified throughout its course.  It has mild diffuse 20% disease proximally.  The LAD is occluded in the mid segment after the second diagonal and septal branch.  It fills through the very faint collaterals and overall appears to be diffusely diseased.  First diagonal is a small-sized branch with a 60% ostial stenosis.  Second diagonal is a relatively large size branch with 40-50% proximal disease. The third diagonal appears to have 2 branches and subtotally occluded. Left circumflex artery:  The vessel was normal in size and nondominant. There is a diffuse 40% disease in the mid segment before the takeoff of OM-3.  OM-1 and OM-2 are very small branches overall.  OM-3 is free of significant disease. Right coronary artery:  The vessel was large size and dominant.  There is a 30% proximal discrete stenosis.  There is also 20% mid stenosis. There is a 30% distal stenosis before the bifurcation.  The right PDA is normal in size and free of significant disease.  There is a large posterolateral branch which is also free of significant disease.  CATH CONCLUSIONS: 1. Severe single-vessel coronary artery disease with an occluded mid     left anterior descending artery.  2. Moderate left main stenosis. 3. Severely elevated left ventricular end-diastolic pressure.  RECOMMENDATIONS:  The LAD appears to be diffusely diseased and does not seem to be a good target for grafting.  The left main stenosis does not seem to be hemodynamically significant yet.  Thus, the benefits of coronary artery bypass graft surgery would probably be limited.  Thus, I recommend aggressive medical therapy for coronary artery disease as well as what seems to be diastolic heart failure with significantly elevated left ventricular end-diastolic pressure.     Kathlyn Sacramento, MD     MA/MEDQ  D:  03/08/2011  T:  03/08/2011  Job:  [id number]  Electronically  Signed by Kathlyn Sacramento MD on 03/22/2011 03:37:13 PM

## 2011-03-22 NOTE — Telephone Encounter (Signed)
Status of appt with kidney specialist.

## 2011-03-23 ENCOUNTER — Telehealth: Payer: Self-pay | Admitting: *Deleted

## 2011-03-23 NOTE — Telephone Encounter (Signed)
Forwarded to [employer]

## 2011-03-24 ENCOUNTER — Telehealth: Payer: Self-pay | Admitting: Cardiology

## 2011-03-24 NOTE — Telephone Encounter (Signed)
Pt trying to get an appt with [place] [facility] and has been unable to make an appt, a person doesn't answer the phone so she has to leave a message, wants to know if anything we can do to help?

## 2011-03-24 NOTE — Telephone Encounter (Signed)
Spoke with pt wife, i have spoke with [facility] and they have the pts paperwork and have determined it is not urgent that he be seen and will call the pt in the next couple weeks to schedule Louis [name]

## 2011-03-29 ENCOUNTER — Ambulatory Visit: Payer: Medicare Other | Admitting: Cardiology

## 2011-04-07 NOTE — Telephone Encounter (Signed)
[name] AWARE HAS APPT WITH DR MATTINGLY ON 04/10/11 AT 10:30 AM./CY

## 2011-04-12 ENCOUNTER — Encounter: Payer: Medicare Other | Admitting: Cardiology

## 2011-04-27 ENCOUNTER — Other Ambulatory Visit: Payer: Self-pay | Admitting: Family Medicine

## 2011-05-04 ENCOUNTER — Other Ambulatory Visit: Payer: Self-pay | Admitting: Family Medicine

## 2011-05-10 ENCOUNTER — Encounter: Payer: Medicare Other | Admitting: Cardiology

## 2011-05-17 ENCOUNTER — Other Ambulatory Visit: Payer: Self-pay | Admitting: *Deleted

## 2011-05-17 ENCOUNTER — Ambulatory Visit (INDEPENDENT_AMBULATORY_CARE_PROVIDER_SITE_OTHER): Payer: Medicare Other | Admitting: Cardiology

## 2011-05-17 ENCOUNTER — Encounter: Payer: Self-pay | Admitting: Cardiology

## 2011-05-17 DIAGNOSIS — I25119 Atherosclerotic heart disease of native coronary artery with unspecified angina pectoris: Secondary | ICD-10-CM | POA: Insufficient documentation

## 2011-05-17 DIAGNOSIS — I2510 Atherosclerotic heart disease of native coronary artery without angina pectoris: Secondary | ICD-10-CM

## 2011-05-17 DIAGNOSIS — E7800 Pure hypercholesterolemia, unspecified: Secondary | ICD-10-CM

## 2011-05-17 DIAGNOSIS — Z79899 Other long term (current) drug therapy: Secondary | ICD-10-CM

## 2011-05-17 DIAGNOSIS — I10 Essential (primary) hypertension: Secondary | ICD-10-CM

## 2011-05-17 NOTE — Patient Instructions (Signed)
Your physician wants you to follow-up in: [place] will receive a reminder letter in the mail two months in advance. If you don't receive a letter, please call our office to schedule the follow-up appointment.   Your physician recommends that you return for lab work in: [place]

## 2011-05-17 NOTE — Assessment & Plan Note (Signed)
Now followed by nephrology.

## 2011-05-17 NOTE — Assessment & Plan Note (Signed)
Continue aspirin, statin, beta blocker and ACE inhibitor. Discussed risk factor modification. Refer to nutritionist.

## 2011-05-17 NOTE — Assessment & Plan Note (Signed)
Management per primary care. 

## 2011-05-17 NOTE — Assessment & Plan Note (Signed)
Blood pressure controlled. Continue present medications. 

## 2011-05-17 NOTE — Assessment & Plan Note (Signed)
Continue statin. Check lipids and liver. 

## 2011-05-17 NOTE — Progress Notes (Signed)
HPI:65 year old male I saw him previously for an abnormal electrocardiogram. Myoview performed in April 2012 revealed apical ischemia and an ejection fraction of 56%. Echocardiogram in April of 2012 showed normal LV function and mild left ventricular hypertrophy. ABIs were normal. Patient had cardiac catheterization in April of 2012 that revealed 50% distal LM stenosis. LAD - mild diffuse 20% disease proximally. The LAD is occluded in the mid segment after the second diagonal and septal branch.  It fills through the very faint collaterals and overall appears to be diffusely diseased. First diagonal is a small-sized branch with a 60% ostial stenosis.  Second diagonal is a relatively large size branch with 40-50% proximal disease. The third diagonal appears to have 2 branches and subtotally occluded. LCX with diffuse 40% disease in the mid segment before the takeoff of OM-3. RCA with 30% proximal discrete stenosis.  There is also 20% mid stenosis. There is a 30% distal stenosis before the bifurcation. Medical therapy recommended. Since he was last seen, the patient denies any dyspnea on exertion, orthopnea, PND, palpitations, syncope or chest pain. He had been seen by nephrology and now on diuretics. Pedal edema has improved.    Current Outpatient Prescriptions  Medication Sig Dispense Refill  . amLODipine (NORVASC) 10 MG tablet Take 10 mg by mouth daily.        Marland Kitchen aspirin 81 MG tablet Take 81 mg by mouth daily.        . cloNIDine (CATAPRES) 0.3 MG tablet TAKE 1 TABLET TWICE A DAY  60 tablet  1  . CRESTOR 40 MG tablet TAKE 1 TABLET BY MOUTH ONCE A DAY AT BEDTIME  30 tablet  1  . furosemide (LASIX) 40 MG tablet Take 40 mg by mouth 2 (two) times daily.        . insulin aspart (NOVOLOG) 100 UNIT/ML injection Inject per SSI with each meal.  20 mL  6  . insulin detemir (LEVEMIR) 100 UNIT/ML injection Inject into the skin at bedtime.        . metoprolol (TOPROL-XL) 100 MG 24 hr tablet Take one and one half  tablet once daily  45 tablet  12  . nitroGLYCERIN (NITROSTAT) 0.4 MG SL tablet Place 0.4 mg under the tongue every 5 (five) minutes as needed.        Marland Kitchen olmesartan (BENICAR) 40 MG tablet Take 40 mg by mouth daily.        . quinapril (ACCUPRIL) 40 MG tablet Take 1 tablet (40 mg total) by mouth daily.  30 tablet  6  . vardenafil (LEVITRA) 10 MG tablet Take 10 mg by mouth daily as needed.        Marland Kitchen DISCONTD: metFORMIN (GLUCOPHAGE) 1000 MG tablet Take 1,000 mg by mouth 2 (two) times daily with a meal.        . DISCONTD: Olmesartan-Amlodipine-HCTZ (TRIBENZOR) 40-5-25 MG TABS Take by mouth.           Past Medical History  Diagnosis Date  . Diabetes mellitus   . Hypertension   . Hyperlipidemia   . OSA on CPAP   . Renal insufficiency   . CAD (coronary artery disease)     Past Surgical History  Procedure Date  . Pilonidal cyst excision     History   Social History  . Marital Status: Married    Spouse Name: N/A    Number of Children: 34  . Years of Education: N/A   Occupational History  . Warehouse    Social History  Main Topics  . Smoking status: Former Smoker -- 1.0 packs/day for 20 years    Types: Cigarettes  . Smokeless tobacco: Not on file  . Alcohol Use: No  . Drug Use: No  . Sexually Active: Not on file   Other Topics Concern  . Not on file   Social History Narrative  . No narrative on file    ROS: no fevers or chills, productive cough, hemoptysis, dysphasia, odynophagia, melena, hematochezia, dysuria, hematuria, rash, seizure activity, orthopnea, PND, pedal edema, claudication. Remaining systems are negative.  Physical Exam: Well-developed obese in no acute distress.  Skin is warm and dry.  HEENT is normal.  Neck is supple. No thyromegaly.  Chest is clear to auscultation with normal expansion.  Cardiovascular exam is regular rate and rhythm.  Abdominal exam nontender or distended. No masses palpated. Extremities show trace edema. neuro grossly intact  ECG  Sinus bradycardia at a rate of 51. Lateral T-wave inversion.

## 2011-05-17 NOTE — Progress Notes (Signed)
Addended by: Cristopher Estimable on: 05/17/2011 02:32 PM   Modules accepted: Orders

## 2011-05-22 ENCOUNTER — Other Ambulatory Visit: Payer: Self-pay | Admitting: Family Medicine

## 2011-05-25 ENCOUNTER — Other Ambulatory Visit: Payer: Self-pay | Admitting: Cardiology

## 2011-05-26 ENCOUNTER — Encounter: Payer: Self-pay | Admitting: Cardiology

## 2011-05-26 LAB — HEPATIC FUNCTION PANEL
ALT: 15 U/L (ref 0–53)
AST: 22 U/L (ref 0–37)
Albumin: 4.1 g/dL (ref 3.5–5.2)
Total Bilirubin: 1 mg/dL (ref 0.3–1.2)

## 2011-05-26 LAB — LIPID PANEL
Cholesterol: 111 mg/dL (ref 0–200)
HDL: 32 mg/dL — ABNORMAL LOW (ref >39)
Total CHOL/HDL Ratio: 3.5 Ratio

## 2011-05-29 ENCOUNTER — Encounter: Payer: Self-pay | Admitting: *Deleted

## 2011-06-22 ENCOUNTER — Other Ambulatory Visit: Payer: Self-pay | Admitting: Family Medicine

## 2011-06-30 ENCOUNTER — Encounter: Payer: Medicare Other | Attending: Cardiology | Admitting: Dietician

## 2011-06-30 DIAGNOSIS — E785 Hyperlipidemia, unspecified: Secondary | ICD-10-CM | POA: Insufficient documentation

## 2011-06-30 DIAGNOSIS — Z713 Dietary counseling and surveillance: Secondary | ICD-10-CM | POA: Insufficient documentation

## 2011-06-30 DIAGNOSIS — I120 Hypertensive chronic kidney disease with stage 5 chronic kidney disease or end stage renal disease: Secondary | ICD-10-CM | POA: Insufficient documentation

## 2011-06-30 DIAGNOSIS — N186 End stage renal disease: Secondary | ICD-10-CM | POA: Insufficient documentation

## 2011-06-30 DIAGNOSIS — E119 Type 2 diabetes mellitus without complications: Secondary | ICD-10-CM | POA: Insufficient documentation

## 2011-06-30 NOTE — Patient Instructions (Signed)
   Get back to the walking, but go low and slow, don't hurt yourself.   Avoid dried beans and peas for potassium content.  Use some home prepared poultry or fish at the lunch the next day.  Use the plain light bread.  USDA Nutrition Levels for potassium content  Try leaching the potatoes, beets, mushrooms.

## 2011-07-02 NOTE — Progress Notes (Signed)
  Medical Nutrition Therapy:  Appt start time: 0830 end time:  0930.   Assessment:  Primary concerns today.  Concerned as what to eat.  Needs to follow a diet for restricting carbohydrates. Needs to eat a diet that limits fat and cholesterol intake.  Needs a diet that will increase fiber and intake of whole grains.  Advised that he needs to limit sodium intake and now his renal physician has limited the vegetables and fruits that contain high potassium levels.  Now he is forced to deal with the diagnosis of ESRD and has had an episode of hyperkalemia that was treated with Lactulose with success. Reports that HgA1C on 06/26/2011 was 6.9%.  He is to fax me the most recent copy of his medications. Wife is present and supportive.  Concerned regarding his fat/cholesterol intake.  Cardiology has recommended 3 oz of animal protein per day. He notes CAD with 100% clogged artery and 50% obstruction of another.  Both are identified as inoperable. His goal is to maintain as much kidney function as possible. Checking blood glucose fasting and before meals.  Currently on Novolog and Levemir.  Weight is staying stable at 253.4 lbs Wants to loose weight for assistance with decreasing his glucose.  MEDICATIONS: To fax his most recent list.   DIETARY INTAKE:  Usual eating pattern includes 3 meals and 2 snacks per day. 24-hr recall:  B (6:30-7:00 AM): Oatmea, with cinnamon and 1/4-1/2 cup of fruit.( 22 gm CHO) Snk ( Mid-morning AM): Package of Lance Crackers  (22 gm CHO) L (12:30-2:00 PM):  Salad with lettuce, carrot, onion and 2 tablespoon of cheese.  Adds 1/2 cup of pinto, great Northern beans, or lima beans.  Use a lite Cesar dressing and drinks water. Snk ( PM): Usually none. D (6:00PM): Country style steak (3-4 oz. Meat, 1/4 cup of gravy), (7 gm CHO)  turnip greens 1/2 cup (5 gm CHO), Fried okra 1/4 cup 7gm CHO), corn bread, 1 small piece (15 gm CHO). Snk ( PM): 1/4 cup unsalted peanuts.  Usual physical  activity: Working in his garden.  Has had limited planned exercise.  Encouraged him to look to establish a regular pattern of exercise on a daily basis.  Estimated energy needs: 1600-1750 calories 185-199 g carbohydrates 125-130 g protein 45-47 g fat  Progress Towards Goal(s):  In progress.   Nutritional Diagnosis:  [place]-2.1 Inpaired nutrition utilization As related to glucose, potasium, fat, and sodium.  As evidenced by diagnosis of diabetes type 2, CAD, ESRD, and episode htperkelemia.    Intervention:  Nutrition Aims to alter nutrient intake to balance glucose levels, lower cholesterol and maintain safe potassium levels.  Review of the foods that are high in potassium.  Provided handout with the levels of potassium.  Review of the need to limit the whole grains as they are higher in potassium. Review and handout for leaching vegetables.   Monitoring/Evaluation:  Dietary intake, exercise, blood glucose monitoring, and body weight to follow -up in 8-12 week.  To call or e-mail with questions or concerns.

## 2011-07-25 ENCOUNTER — Other Ambulatory Visit: Payer: Self-pay | Admitting: Family Medicine

## 2011-09-02 ENCOUNTER — Other Ambulatory Visit: Payer: Self-pay | Admitting: Family Medicine

## 2011-10-08 ENCOUNTER — Other Ambulatory Visit: Payer: Self-pay | Admitting: Family Medicine

## 2011-10-20 ENCOUNTER — Encounter: Payer: Self-pay | Admitting: Family Medicine

## 2011-10-20 ENCOUNTER — Telehealth: Payer: Self-pay | Admitting: Family Medicine

## 2011-10-20 ENCOUNTER — Ambulatory Visit (INDEPENDENT_AMBULATORY_CARE_PROVIDER_SITE_OTHER): Payer: Medicare Other | Admitting: Family Medicine

## 2011-10-20 VITALS — BP 121/61 | HR 53 | Wt 262.0 lb

## 2011-10-20 DIAGNOSIS — L081 Erythrasma: Secondary | ICD-10-CM

## 2011-10-20 DIAGNOSIS — A4289 Other forms of actinomycosis: Secondary | ICD-10-CM

## 2011-10-20 MED ORDER — CEPHALEXIN 500 MG PO CAPS: 500.0000 mg | ORAL_CAPSULE | Freq: Three times a day (TID) | ORAL | Status: AC

## 2011-10-20 MED ORDER — NYSTATIN 100000 UNIT/GM EX CREA: TOPICAL_CREAM | Freq: Two times a day (BID) | CUTANEOUS | Status: DC

## 2011-10-20 NOTE — Patient Instructions (Signed)
Call if not better in 6 weeks.

## 2011-10-20 NOTE — Progress Notes (Signed)
  Subjective:    Patient ID: Louis [name], male    DOB: 1946-08-21, 65 y.o.   MRN: [id number]  HPI RAsh started 3 months ago. Tried bismuth and burbon mixture which is an old home remedy.  Also tried a barrier cream.  Started after went out [place] on a trip.  He's never had this before. His wife is concerned because she thinks the area section getting larger. He's had some weeping of the area as well. Is very tender. No fever. No pus.   Review of Systems     Objective:   Physical Exam Between his buttock cheeks around his rectum he has an erythematous patch that is approximately 12 x 8 cm in size. There is some dry scaling skin superficially but no active drainage or weeping. No nodules or pustules. No satellite lesions. I did examine the rash with a Wood's lamp and felt that I could appreciate some red fluorescence.       Assessment & Plan:    Erythrasma-most likely diagnosis based on exam. I will treat him with keflex and topical nystatin. If not improving in the next 2 weeks then will treat with topical clindamycin cream.

## 2011-10-20 NOTE — Telephone Encounter (Signed)
Please ask him if he got his flu shot this year. If he has not he is welcome to get here. Also I wanted to see if his colonoscopy was up-to-date.

## 2011-10-23 NOTE — Telephone Encounter (Signed)
No answer and No machine

## 2011-10-31 ENCOUNTER — Other Ambulatory Visit: Payer: Self-pay | Admitting: Family Medicine

## 2011-10-31 MED ORDER — AMBULATORY NON FORMULARY MEDICATION: Status: DC

## 2011-11-07 ENCOUNTER — Telehealth: Payer: Self-pay | Admitting: *Deleted

## 2011-11-07 MED ORDER — FLUCONAZOLE 150 MG PO TABS: 150.0000 mg | ORAL_TABLET | Freq: Every day | ORAL | Status: AC

## 2011-11-07 MED ORDER — CLINDAMYCIN PHOSPHATE 1 % EX GEL: CUTANEOUS | Status: DC

## 2011-11-07 NOTE — Telephone Encounter (Signed)
Will send over rx for topical clindmycin. If not some bettetr in one week then call back and will refer to dermatology.

## 2011-11-07 NOTE — Telephone Encounter (Signed)
Pt notifeid. KJ LPN

## 2011-11-07 NOTE — Telephone Encounter (Signed)
Pt wife was wondering about using Diflucan for it and wanted to know if could try this as she is having surgery on Dec. 28 and would like to have this taken care of before then. I believe they are wanting the pill Diflucan

## 2011-11-07 NOTE — Telephone Encounter (Signed)
Pt treated for fungal inf. On buttocks. Wife states its as big as your hand and the antibitoic and topical cream did not help much and wonders what she needs to do next. ? Appointment or different med

## 2011-11-07 NOTE — Telephone Encounter (Signed)
That is OK but this is more than a yeast infection. I want him to do the clinda gel too.

## 2011-11-08 NOTE — Telephone Encounter (Signed)
Pt wife notified of MD instructions and med sent

## 2011-11-18 ENCOUNTER — Other Ambulatory Visit: Payer: Self-pay | Admitting: Family Medicine

## 2011-11-23 ENCOUNTER — Telehealth: Payer: Self-pay | Admitting: Family Medicine

## 2011-11-23 DIAGNOSIS — N2581 Secondary hyperparathyroidism of renal origin: Secondary | ICD-10-CM | POA: Diagnosis not present

## 2011-11-23 DIAGNOSIS — N183 Chronic kidney disease, stage 3 unspecified: Secondary | ICD-10-CM | POA: Diagnosis not present

## 2011-11-23 NOTE — Telephone Encounter (Signed)
Call pt. Is his rash any better?

## 2011-11-24 ENCOUNTER — Telehealth: Payer: Self-pay | Admitting: *Deleted

## 2011-11-24 MED ORDER — CLINDAMYCIN PHOSPHATE 1 % EX GEL: CUTANEOUS | Status: DC

## 2011-11-24 NOTE — Telephone Encounter (Signed)
Will send over refill on clindagel. Will send over larger amt this time.

## 2011-11-24 NOTE — Telephone Encounter (Signed)
LMOM to call if no better

## 2011-11-24 NOTE — Telephone Encounter (Signed)
Pt called and states his rash is not getting any better.states it seemed to start to clear up and then it flared again and he is out of abx but still has cream

## 2011-11-24 NOTE — Telephone Encounter (Signed)
Pt.notified

## 2011-12-07 ENCOUNTER — Other Ambulatory Visit: Payer: Self-pay | Admitting: Family Medicine

## 2011-12-07 DIAGNOSIS — L259 Unspecified contact dermatitis, unspecified cause: Secondary | ICD-10-CM | POA: Diagnosis not present

## 2011-12-18 ENCOUNTER — Telehealth: Payer: Self-pay | Admitting: *Deleted

## 2011-12-18 NOTE — Telephone Encounter (Signed)
Med update

## 2011-12-28 DIAGNOSIS — L259 Unspecified contact dermatitis, unspecified cause: Secondary | ICD-10-CM | POA: Diagnosis not present

## 2012-01-05 DIAGNOSIS — N2581 Secondary hyperparathyroidism of renal origin: Secondary | ICD-10-CM | POA: Diagnosis not present

## 2012-01-05 DIAGNOSIS — N183 Chronic kidney disease, stage 3 unspecified: Secondary | ICD-10-CM | POA: Diagnosis not present

## 2012-01-11 DIAGNOSIS — H524 Presbyopia: Secondary | ICD-10-CM | POA: Insufficient documentation

## 2012-01-11 DIAGNOSIS — H52209 Unspecified astigmatism, unspecified eye: Secondary | ICD-10-CM | POA: Diagnosis not present

## 2012-01-11 DIAGNOSIS — E1139 Type 2 diabetes mellitus with other diabetic ophthalmic complication: Secondary | ICD-10-CM | POA: Diagnosis not present

## 2012-01-11 DIAGNOSIS — E11329 Type 2 diabetes mellitus with mild nonproliferative diabetic retinopathy without macular edema: Secondary | ICD-10-CM | POA: Diagnosis not present

## 2012-01-11 DIAGNOSIS — E119 Type 2 diabetes mellitus without complications: Secondary | ICD-10-CM | POA: Diagnosis not present

## 2012-01-29 DIAGNOSIS — E119 Type 2 diabetes mellitus without complications: Secondary | ICD-10-CM | POA: Diagnosis not present

## 2012-03-04 ENCOUNTER — Encounter: Payer: Self-pay | Admitting: *Deleted

## 2012-03-06 ENCOUNTER — Encounter: Payer: Self-pay | Admitting: Cardiology

## 2012-03-06 ENCOUNTER — Ambulatory Visit (INDEPENDENT_AMBULATORY_CARE_PROVIDER_SITE_OTHER): Payer: Medicare Other | Admitting: Cardiology

## 2012-03-06 ENCOUNTER — Other Ambulatory Visit: Payer: Self-pay | Admitting: Cardiology

## 2012-03-06 VITALS — BP 140/76 | HR 52 | Ht 68.0 in | Wt 260.0 lb

## 2012-03-06 DIAGNOSIS — E7800 Pure hypercholesterolemia, unspecified: Secondary | ICD-10-CM | POA: Diagnosis not present

## 2012-03-06 DIAGNOSIS — E785 Hyperlipidemia, unspecified: Secondary | ICD-10-CM

## 2012-03-06 DIAGNOSIS — R5381 Other malaise: Secondary | ICD-10-CM | POA: Diagnosis not present

## 2012-03-06 DIAGNOSIS — R5383 Other fatigue: Secondary | ICD-10-CM

## 2012-03-06 DIAGNOSIS — I2510 Atherosclerotic heart disease of native coronary artery without angina pectoris: Secondary | ICD-10-CM

## 2012-03-06 DIAGNOSIS — I10 Essential (primary) hypertension: Secondary | ICD-10-CM

## 2012-03-06 DIAGNOSIS — N183 Chronic kidney disease, stage 3 unspecified: Secondary | ICD-10-CM

## 2012-03-06 NOTE — Assessment & Plan Note (Signed)
Followed by nephrology. 

## 2012-03-06 NOTE — Patient Instructions (Signed)
Your physician wants you to follow-up in: [place] will receive a reminder letter in the mail two months in advance. If you don't receive a letter, please call our office to schedule the follow-up appointment.

## 2012-03-06 NOTE — Assessment & Plan Note (Signed)
Check TSH and hemoglobin. There may be a contribution from either clonidine or Toprol. We discussed reducing the doses of these medications but he would prefer to continue at present. We will consider this in the future and adding a medicine such as Norvasc if his symptoms persist. Note he does use CPAP for his sleep apnea. We are so discussed the importance of exercise and diet for weight loss.

## 2012-03-06 NOTE — Assessment & Plan Note (Signed)
Continue present medications. 

## 2012-03-06 NOTE — Assessment & Plan Note (Signed)
Continue aspirin and statin. 

## 2012-03-06 NOTE — Assessment & Plan Note (Signed)
Continue statin. Check lipids and liver. 

## 2012-03-06 NOTE — Progress Notes (Signed)
HPI: Pleasant male for FU of CAD. I saw him previously for an abnormal electrocardiogram. Myoview performed in April 2012 revealed apical ischemia and an ejection fraction of 56%. Echocardiogram in April of 2012 showed normal LV function and mild left ventricular hypertrophy. ABIs were normal. Patient had cardiac catheterization in April of 2012 that revealed 50% distal LM stenosis. LAD - mild diffuse 20% disease proximally. The LAD is occluded in the mid segment after the second diagonal and septal branch. It fills through the very faint collaterals and overall appears to be diffusely diseased. First diagonal is a small-sized branch with a 60% ostial stenosis. Second diagonal is a relatively large size branch with 40-50% proximal disease. The third diagonal appears to have 2 branches and subtotally occluded. LCX with diffuse 40% disease in the mid segment before the takeoff of OM-3. RCA with 30% proximal discrete stenosis. There is also 20% mid stenosis. There is a 30% distal stenosis before the bifurcation. Medical therapy recommended. Since he was last seen in June of 2012, the patient has dyspnea with more extreme activities but not with routine activities. It is relieved with rest. It is not associated with chest pain. There is no orthopnea, PND or pedal edema. There is no syncope or palpitations. There is no exertional chest pain. Patient does complain of fatigue.    Current Outpatient Prescriptions  Medication Sig Dispense Refill  . AMBULATORY NON FORMULARY MEDICATION Medication Name: diabeic shoes. Dx 250.00  1 Units  0  . aspirin 81 MG tablet Take 81 mg by mouth daily.        . Cholecalciferol (VITAMIN D-3 PO) Take 1 tablet by mouth daily.      . cloNIDine (CATAPRES) 0.3 MG tablet Take 0.3 mg by mouth 3 (three) times daily.      . Coenzyme Q10 (COQ10) 50 MG CAPS Take 50 mg by mouth daily.      . CRESTOR 40 MG tablet TAKE 1 TABLET BY MOUTH ONCE A DAY AT BEDTIME  30 tablet  3  . furosemide  (LASIX) 80 MG tablet Take 80 mg by mouth 2 (two) times daily.      Marland Kitchen glucosamine-chondroitin 500-400 MG tablet Take 1 tablet by mouth 3 (three) times daily.      . insulin detemir (LEVEMIR) 100 UNIT/ML injection Inject into the skin at bedtime.        Marland Kitchen LEVITRA 10 MG tablet TAKE 1 TABLET EVERY DAY AS NEEDED  12 tablet  1  . metoprolol succinate (TOPROL-XL) 100 MG 24 hr tablet TAKE 1& 1/2 TABLET BY MOUTH EVERY DAY  45 tablet  3  . Multiple Vitamin (MULTIVITAMIN) tablet Take 1 tablet by mouth daily.      . nitroGLYCERIN (NITROSTAT) 0.4 MG SL tablet Place 0.4 mg under the tongue every 5 (five) minutes as needed.        Marland Kitchen NOVOLOG 100 UNIT/ML injection INJECT PER SSI WITH EACH MEAL.  20 mL  6  . olmesartan (BENICAR) 40 MG tablet Take 40 mg by mouth daily.        Marland Kitchen Specialty Vitamins Products (VARISAN VITALITY PO) Take 2 tablets by mouth daily.      Marland Kitchen Specialty Vitamins Products TABS Take by mouth 2 (two) times daily. Calcium, magnesium, and vit d         Past Medical History  Diagnosis Date  . Diabetes mellitus   . Hypertension   . Hyperlipidemia   . OSA on CPAP   . Renal  insufficiency   . CAD (coronary artery disease)     Past Surgical History  Procedure Date  . Pilonidal cyst excision     History   Social History  . Marital Status: Married    Spouse Name: N/A    Number of Children: 69  . Years of Education: N/A   Occupational History  . Warehouse    Social History Main Topics  . Smoking status: Former Smoker -- 1.0 packs/day for 20 years    Types: Cigarettes    Quit date: 03/06/1977  . Smokeless tobacco: Not on file  . Alcohol Use: No  . Drug Use: No  . Sexually Active: Not on file   Other Topics Concern  . Not on file   Social History Narrative  . No narrative on file    ROS: fatigue but no fevers or chills, productive cough, hemoptysis, dysphasia, odynophagia, melena, hematochezia, dysuria, hematuria, rash, seizure activity, orthopnea, PND, pedal edema,  claudication. Remaining systems are negative.  Physical Exam: Well-developed obese in no acute distress.  Skin is warm and dry.  HEENT is normal.  Neck is supple. No thyromegaly.  Chest is clear to auscultation with normal expansion.  Cardiovascular exam is regular rate and rhythm.  Abdominal exam nontender or distended. No masses palpated. Extremities show trace edema. neuro grossly intact  ECG sinus rhythm at a rate of 52. Anterolateral T-wave changes. No change compared to previous.

## 2012-03-07 LAB — LIPID PANEL
LDL Cholesterol: 57 mg/dL (ref 0–99)
Triglycerides: 195 mg/dL — ABNORMAL HIGH (ref <150)
VLDL: 39 mg/dL (ref 0–40)

## 2012-03-07 LAB — HEPATIC FUNCTION PANEL
ALT: 15 U/L (ref 0–53)
AST: 19 U/L (ref 0–37)
Total Protein: 7.1 g/dL (ref 6.0–8.3)

## 2012-03-07 LAB — TSH: TSH: 1.669 u[IU]/mL (ref 0.350–4.500)

## 2012-03-08 LAB — CBC WITH DIFFERENTIAL/PLATELET
Basophils Absolute: 0 10*3/uL (ref 0.0–0.1)
Basophils Relative: 0 % (ref 0–1)
Eosinophils Absolute: 0.2 10*3/uL (ref 0.0–0.7)
Eosinophils Relative: 4 % (ref 0–5)
HCT: 43.8 % (ref 39.0–52.0)
Hemoglobin: 14.3 g/dL (ref 13.0–17.0)
MCH: 29.3 pg (ref 26.0–34.0)
MCHC: 32.6 g/dL (ref 30.0–36.0)
MCV: 89.8 fL (ref 78.0–100.0)
Monocytes Absolute: 0.6 10*3/uL (ref 0.1–1.0)
Monocytes Relative: 9 % (ref 3–12)
Neutro Abs: 3.9 10*3/uL (ref 1.7–7.7)
RDW: 14 % (ref 11.5–15.5)

## 2012-03-12 ENCOUNTER — Encounter: Payer: Self-pay | Admitting: *Deleted

## 2012-03-21 ENCOUNTER — Telehealth: Payer: Self-pay | Admitting: Cardiology

## 2012-03-21 NOTE — Telephone Encounter (Signed)
New Problem:     Called in needing a diagnosis code that would cover a lipid profile for the patient.  Please call back.

## 2012-03-21 NOTE — Telephone Encounter (Signed)
Spoke with lab, dx 272.0 and 414.01 given.

## 2012-03-25 NOTE — Progress Notes (Signed)
Addended by: Concepcion Living D on: 03/25/2012 03:44 PM   Modules accepted: Orders

## 2012-03-27 ENCOUNTER — Ambulatory Visit (INDEPENDENT_AMBULATORY_CARE_PROVIDER_SITE_OTHER): Payer: Medicare Other | Admitting: Physician Assistant

## 2012-03-27 ENCOUNTER — Encounter: Payer: Self-pay | Admitting: Physician Assistant

## 2012-03-27 VITALS — BP 110/58 | HR 56 | Ht 68.0 in | Wt 256.0 lb

## 2012-03-27 DIAGNOSIS — R200 Anesthesia of skin: Secondary | ICD-10-CM

## 2012-03-27 DIAGNOSIS — Z8639 Personal history of other endocrine, nutritional and metabolic disease: Secondary | ICD-10-CM

## 2012-03-27 DIAGNOSIS — R209 Unspecified disturbances of skin sensation: Secondary | ICD-10-CM | POA: Diagnosis not present

## 2012-03-27 DIAGNOSIS — E119 Type 2 diabetes mellitus without complications: Secondary | ICD-10-CM | POA: Diagnosis not present

## 2012-03-27 DIAGNOSIS — Z862 Personal history of diseases of the blood and blood-forming organs and certain disorders involving the immune mechanism: Secondary | ICD-10-CM | POA: Diagnosis not present

## 2012-03-27 NOTE — Progress Notes (Signed)
  Subjective:    Patient ID: Louis [name], male    DOB: January 08, 1946, 66 y.o.   MRN: [id number]  HPI Patient presents to the clinic today because of numbness and tingling of both hands and feet. Last time he had the symptoms he was hyperkalemic. Patient has been juicing for 10 days in hopes of losing weight and getting help year. He's also noticed does have some muscle cramps in both legs. He denies any chest pain or shortness of breath. He is on Crestor and Benicar for CAD. One of his arteries this plaque 100% and the other one is 50% that he is not a surgical candidate until he gets to 60% the second artery. Patient is a diabetic and he uses long-acting inform as well as short acting insulin at mealtime. He uses the sliding scale. He does check his sugars frequently and has stated that they have been great for the past couple weeks. Endocrinology is managing his diabetes as well as a has a dressing his chronic kidney disease.    Review of Systems     Objective:   Physical Exam  Constitutional: He is oriented to person, place, and time. He appears well-developed and well-nourished.  HENT:  Head: Normocephalic and atraumatic.  Cardiovascular: Normal rate, regular rhythm and normal heart sounds.   Pulmonary/Chest: Effort normal and breath sounds normal. He has no wheezes.  Musculoskeletal:       Bilateral handsand legs bilaterally 5/5 strength. Normal range of motion of both hands and legs.no edema present on the examination today in hands or feet. Pedal pulses palpated 2+ bilaterally, radial pulses palpated 2+ bilaterally.  Neurological: He is alert and oriented to person, place, and time.  Skin: Skin is warm and dry.  Psychiatric: He has a normal mood and affect. His behavior is normal.          Assessment & Plan:  History of hyperkalemia/bilateral numbness and tingling of both hands and legs/diabetes type 2-cannot completely rule out numbness and tingling is from diabetic neuropathy.  I did make patient aware of this. I divided metatarsal medications that can help control this nerve pain; however, we would want to run that by endocrinology before starting him on any other medications. As well as we would need to make sure that this will not worsen his kidney function. A CMP which he FR we obtained today patient will be called with results. Patient was given a handout of foods a lot of potassium in them. He was told to avoid and limit these foods at least until we got lab results back. If he is chest pains, worsening cramps he was to call our office or go to urgent care. Patient was educated that high levels of potassium can cause heart problems.patient did not have short acting insulin and asked for samples today I did give him one vial.  Discussed need for health maintenance issues such as colonoscopy and scheduled that same. Patient wants to wait on these things but states he will schedule appointment to come back to talk about these items.

## 2012-03-27 NOTE — Patient Instructions (Addendum)
These are food you want to limit or avoid. Will get labs and call with results. Make sure you are not taking too much insulin this can also cause your potassium levels to be too high. Call or go to ER for chest pains or palpitations.  Foods Rich in Potassium Food / Potassium (mg)  Apricots, dried,  cup / 378 mg   Apricots, raw, 1 cup halves / 401 mg   Avocado,  / 487 mg   Banana, 1 large / 487 mg   Beef, lean, round, 3 oz / 202 mg   Cantaloupe, 1 cup cubes / 427 mg   Dates, medjool, 5 whole / 835 mg   Ham, cured, 3 oz / 212 mg   Lentils, dried,  cup / 458 mg   Lima beans, frozen,  cup / 258 mg   Orange, 1 large / 333 mg   Orange juice, 1 cup / 443 mg   Peaches, dried,  cup / 398 mg   Peas, split, cooked,  cup / 355 mg   Potato, boiled, 1 medium / 515 mg   Prunes, dried, uncooked,  cup / 318 mg   Raisins,  cup / 309 mg   Salmon, pink, raw, 3 oz / 275 mg   Sardines, canned , 3 oz / 338 mg   Tomato, raw, 1 medium / 292 mg   Tomato juice, 6 oz / 417 mg   [place], 3 oz / 349 mg  Document Released: 11/06/2005 Document Revised: 07/19/2011 Document Reviewed: 03/22/2009 [facility] Patient Information 2012 [place], [place].Hyperkalemia Hyperkalemia is when you have too much potassium in your blood. This can be a life-threatening condition. Potassium is normally removed (excreted) from the body by the kidneys. CAUSES  The potassium level in your body can become too high for the following reasons:  You take in too much potassium. You can do this by:   Using salt substitutes. They contain large amounts of potassium.   Taking potassium supplements from your caregiver. The dose may be too high for you.   Eating foods or taking nutritional products with potassium.   You excrete too little potassium. This can happen if:   Your kidneys are not functioning properly. Kidney (renal) disease is a very common cause of hyperkalemia.   You are taking medicines that lower  your excretion of potassium, such as certain diuretic medicines.   You have an adrenal gland disease called Addison's disease.   You have a urinary tract obstruction, such as kidney stones.   You are on treatment to mechanically clean your blood (dialysis) and you skip a treatment.   You release a high amount of potassium from your cells into your blood. You may have a condition that causes potassium to move from your cells to your bloodstream. This can happen with:   Injury to muscles or other tissues. Most potassium is stored in the muscles.   Severe burns or infections.   Acidic blood plasma (acidosis). Acidosis can result from many diseases, such as uncontrolled diabetes.  SYMPTOMS  Usually, there are no symptoms unless the potassium is dangerously high or has risen very quickly. Symptoms may include:  Irregular or very slow heartbeat.   Feeling sick to your stomach (nauseous).   Tiredness (fatigue).   Nerve problems such as tingling of the skin, numbness of the hands or feet, weakness, or paralysis.  DIAGNOSIS  A simple blood test can measure the amount of potassium in your body. An electrocardiogram test of  the heart can also help make the diagnosis. The heart may beat dangerously fast or slow down and stop beating with severe hyperkalemia.  TREATMENT  Treatment depends on how bad the condition is and on the underlying cause.  If the hyperkalemia is an emergency (causing heart problems or paralysis), many different medicines can be used alone or together to lower the potassium level briefly. This may include an insulin injection even if you are not diabetic. Emergency dialysis may be needed to remove potassium from the body.   If the hyperkalemia is less severe or dangerous, the underlying cause is treated. This can include taking medicines if needed. Your prescription medicines may be changed. You may also need to take a medicine to help your body get rid of potassium. You may  need to eat a diet low in potassium.  HOME CARE INSTRUCTIONS   Take medicines and supplements as directed by your caregiver.   Do not take any over-the-counter medicines, supplements, natural products, herbs, or vitamins without reviewing them with your caregiver. Certain supplements and natural food products can have high amounts of potassium. Other products (such as ibuprofen) can damage weak kidneys and raise your potassium.   You may be asked to do repeat lab tests. Be sure to follow these directions.   If you have kidney disease, you may need to follow a low potassium diet.  SEEK MEDICAL CARE IF:   You notice an irregular or very slow heartbeat.   You feel lightheaded.   You develop weakness that is unusual for you.  SEEK IMMEDIATE MEDICAL CARE IF:   You have shortness of breath.   You have chest discomfort.   You pass out (faint).  MAKE SURE YOU:   Understand these instructions.   Will watch your condition.   Will get help right away if you are not doing well or get worse.  Document Released: 10/27/2002 Document Revised: 10/26/2011 Document Reviewed: 04/13/2011 [facility] Patient Information 2012 [place], [place].

## 2012-03-28 LAB — COMPLETE METABOLIC PANEL WITH GFR
AST: 19 U/L (ref 0–37)
Albumin: 4.2 g/dL (ref 3.5–5.2)
Alkaline Phosphatase: 99 U/L (ref 39–117)
GFR, Est Non African American: 29 mL/min — ABNORMAL LOW
Potassium: 4.9 mEq/L (ref 3.5–5.3)
Sodium: 139 mEq/L (ref 135–145)
Total Bilirubin: 0.9 mg/dL (ref 0.3–1.2)
Total Protein: 7 g/dL (ref 6.0–8.3)

## 2012-04-04 ENCOUNTER — Other Ambulatory Visit: Payer: Self-pay | Admitting: *Deleted

## 2012-04-04 DIAGNOSIS — N183 Chronic kidney disease, stage 3 unspecified: Secondary | ICD-10-CM | POA: Diagnosis not present

## 2012-04-04 DIAGNOSIS — E119 Type 2 diabetes mellitus without complications: Secondary | ICD-10-CM | POA: Diagnosis not present

## 2012-04-04 DIAGNOSIS — I129 Hypertensive chronic kidney disease with stage 1 through stage 4 chronic kidney disease, or unspecified chronic kidney disease: Secondary | ICD-10-CM | POA: Diagnosis not present

## 2012-04-04 DIAGNOSIS — I10 Essential (primary) hypertension: Secondary | ICD-10-CM | POA: Diagnosis not present

## 2012-04-04 MED ORDER — METOPROLOL SUCCINATE ER 100 MG PO TB24: 150.0000 mg | ORAL_TABLET | Freq: Every day | ORAL | Status: DC

## 2012-05-10 DIAGNOSIS — E1139 Type 2 diabetes mellitus with other diabetic ophthalmic complication: Secondary | ICD-10-CM | POA: Diagnosis not present

## 2012-05-10 DIAGNOSIS — E11329 Type 2 diabetes mellitus with mild nonproliferative diabetic retinopathy without macular edema: Secondary | ICD-10-CM | POA: Diagnosis not present

## 2012-05-10 DIAGNOSIS — E119 Type 2 diabetes mellitus without complications: Secondary | ICD-10-CM | POA: Diagnosis not present

## 2012-05-10 DIAGNOSIS — H579 Unspecified disorder of eye and adnexa: Secondary | ICD-10-CM | POA: Diagnosis not present

## 2012-07-11 DIAGNOSIS — N183 Chronic kidney disease, stage 3 unspecified: Secondary | ICD-10-CM | POA: Diagnosis not present

## 2012-07-11 DIAGNOSIS — E119 Type 2 diabetes mellitus without complications: Secondary | ICD-10-CM | POA: Diagnosis not present

## 2012-07-11 LAB — GENERAL HEALTH PANEL
Calcium: 9.8 mg/dL
Chloride: 95 mmol/L
GFR, Est Non African American: 32
Glucose: 133
Potassium: 4.5 mmol/L

## 2012-07-15 ENCOUNTER — Other Ambulatory Visit: Payer: Self-pay | Admitting: Family Medicine

## 2012-07-28 ENCOUNTER — Other Ambulatory Visit: Payer: Self-pay | Admitting: Family Medicine

## 2012-08-17 ENCOUNTER — Other Ambulatory Visit: Payer: Self-pay | Admitting: Family Medicine

## 2012-08-20 ENCOUNTER — Encounter: Payer: Self-pay | Admitting: Family Medicine

## 2012-08-20 ENCOUNTER — Other Ambulatory Visit: Payer: Self-pay | Admitting: Family Medicine

## 2012-08-20 ENCOUNTER — Ambulatory Visit (HOSPITAL_BASED_OUTPATIENT_CLINIC_OR_DEPARTMENT_OTHER): Payer: Medicare Other

## 2012-08-20 ENCOUNTER — Ambulatory Visit (INDEPENDENT_AMBULATORY_CARE_PROVIDER_SITE_OTHER): Payer: Medicare Other | Admitting: Family Medicine

## 2012-08-20 ENCOUNTER — Ambulatory Visit (HOSPITAL_BASED_OUTPATIENT_CLINIC_OR_DEPARTMENT_OTHER)
Admission: RE | Admit: 2012-08-20 | Discharge: 2012-08-20 | Disposition: A | Discharge status: Home / Self Care | Payer: Medicare Other | Source: Ambulatory Visit | Attending: Family Medicine | Admitting: Family Medicine

## 2012-08-20 VITALS — BP 132/71 | HR 52 | Ht 67.0 in | Wt 253.0 lb

## 2012-08-20 DIAGNOSIS — M7989 Other specified soft tissue disorders: Secondary | ICD-10-CM | POA: Diagnosis not present

## 2012-08-20 DIAGNOSIS — I87309 Chronic venous hypertension (idiopathic) without complications of unspecified lower extremity: Secondary | ICD-10-CM | POA: Diagnosis not present

## 2012-08-20 DIAGNOSIS — M79609 Pain in unspecified limb: Secondary | ICD-10-CM | POA: Diagnosis not present

## 2012-08-20 DIAGNOSIS — L988 Other specified disorders of the skin and subcutaneous tissue: Secondary | ICD-10-CM | POA: Insufficient documentation

## 2012-08-20 DIAGNOSIS — K529 Noninfective gastroenteritis and colitis, unspecified: Secondary | ICD-10-CM

## 2012-08-20 DIAGNOSIS — K5289 Other specified noninfective gastroenteritis and colitis: Secondary | ICD-10-CM

## 2012-08-20 DIAGNOSIS — M79606 Pain in leg, unspecified: Secondary | ICD-10-CM

## 2012-08-20 MED ORDER — CEPHALEXIN 500 MG PO CAPS: 500.0000 mg | ORAL_CAPSULE | Freq: Three times a day (TID) | ORAL | Status: DC

## 2012-08-20 NOTE — Progress Notes (Signed)
  Subjective:    Patient ID: Louis [name], male    DOB: 1946-09-02, 66 y.o.   MRN: [id number]  HPI Had gastroenteritis over the weekend with fever to 102.  Noticed right leg redness for 4 days. It has been hot to touch. Last fever was yesterday.  No trauma ot the leg.  He does have a history chronic venous stasis. He's been unable to wear his compression stockings and his wife had breast cancer because she's the one that typically helps him put him on. He has been taking Lasix twice a day per the recommendations of his nephrologist. He's also only about 50 ounces of fluid a day and he says he has been abiding by this. He says normally his left leg is more swollen than the right and so these last 4 days. He is also diabetic.   Review of Systems     Objective:   Physical Exam  Constitutional: He is oriented to person, place, and time. He appears well-developed and well-nourished.  HENT:  Head: Normocephalic and atraumatic.  Cardiovascular: Normal rate, regular rhythm and normal heart sounds.   Pulmonary/Chest: Effort normal and breath sounds normal.  Musculoskeletal:       1+ pitting edema on the left ankle. He has 2+ pitting edema on the right leg from the knee down. I was unable to palpate dorsal pedal or posterior tibial pulses I did get a Doppler ultrasound to confirm that he does have good pulses. He does have significant erythema that is not well demarcated over the right anterior shin. He is very tender with calf squeeze on the right. He's nontender pressing on the shin itself. No actual wounds or open drainage. Increased warmth compared to left leg.  Neurological: He is alert and oriented to person, place, and time.  Skin: Skin is warm and dry.  Psychiatric: He has a normal mood and affect. His behavior is normal.          Assessment & Plan:  Gastroenteritis-resolving. He is feeling better. He's had no more vomiting he has been afebrile for at least the last 12 hours.  Right  lower leg swelling and edema-very concerned he could have a DVT. We will get a stat d-dimer as well as go ahead and schedule him for a venous Doppler. We'll call him results as soon as possible. He does have a history of diabetes and high blood pressure and high cholesterol. He also has a history of coronary artery disease.  Diabetes-given samples of Levemir and NovoLog.  Chronic venous stasis-his wife is asking about pneumatic compression devices. I explained her that these are typically only used a hospital setting and not use in the home. Typically we would use compression stockings. Right now would not recommend using them until we get the venous Doppler but afterwards if everything is normal and negative then I do recommend he start using them on regular basis.

## 2012-08-29 ENCOUNTER — Ambulatory Visit (INDEPENDENT_AMBULATORY_CARE_PROVIDER_SITE_OTHER): Payer: Medicare Other | Admitting: Family Medicine

## 2012-08-29 ENCOUNTER — Encounter: Payer: Self-pay | Admitting: Family Medicine

## 2012-08-29 VITALS — BP 148/71 | HR 56 | Ht 68.0 in | Wt 255.0 lb

## 2012-08-29 DIAGNOSIS — IMO0001 Reserved for inherently not codable concepts without codable children: Secondary | ICD-10-CM

## 2012-08-29 DIAGNOSIS — Z23 Encounter for immunization: Secondary | ICD-10-CM | POA: Diagnosis not present

## 2012-08-29 DIAGNOSIS — R030 Elevated blood-pressure reading, without diagnosis of hypertension: Secondary | ICD-10-CM

## 2012-08-29 DIAGNOSIS — M7989 Other specified soft tissue disorders: Secondary | ICD-10-CM | POA: Diagnosis not present

## 2012-08-29 NOTE — Patient Instructions (Addendum)
Call if not better in 2-3 weeks.

## 2012-08-29 NOTE — Progress Notes (Signed)
  Subjective:    Patient ID: Louis [name], male    DOB: 10/23/1946, 66 y.o.   MRN: [id number]  HPI Follow up swelling and erythema of the right lower leg-it does seem to be improved. His wife reports that she feels it's no longer quite as red. It is still swelling. The he always has some trace to 1+ lower extremity edema chronically.. Takes Lasix 80 mg twice a day. He does have CKD 3. He says he thinks he may have actually been bitten by a black widow spider initially. He said he been chopping wood the day before he got sick with vomiting and diarrhea and had killed several black widow spiders. He said he never actually fell of bike but then when he went online and read about it he thinks that that may have caused it. He acutally denied any wound or abscess or lesion.  His d-dimer was elevated at that time we sent him for a venous ultrasound which was negative for DVT. He says he has been trying to fluid restrict about 62 ounces per day.  He has been wearing his compression stockings   Review of Systems     Objective:   Physical Exam  Constitutional: He appears well-developed and well-nourished.  HENT:  Head: Normocephalic and atraumatic.  Eyes: Conjunctivae normal are normal. Pupils are equal, round, and reactive to light.  Musculoskeletal:       Right leg with 2+ pitting edema from the knee down. Some slight dusky appearance over the lower leg near the ankle. No lesions or wounds or sign of abscess or infection. No cellulitis.   Skin: Skin is warm and dry.  Psychiatric: He has a normal mood and affect.          Assessment & Plan:  LE swelling - No localized reaction suspicous for highly venomous insect bite.  Continue to work on fluid restricting.  Continue to wear his compression stockings. I think this will slowly continue to get better. The erythema started improving. I do not recommend antibiotics at this time.  DVT ruled out.   Elevated BP - Work on fluid restriction. Recheck at  f/u.

## 2012-09-26 DIAGNOSIS — E785 Hyperlipidemia, unspecified: Secondary | ICD-10-CM | POA: Diagnosis not present

## 2012-09-26 DIAGNOSIS — I2510 Atherosclerotic heart disease of native coronary artery without angina pectoris: Secondary | ICD-10-CM | POA: Diagnosis not present

## 2012-09-26 DIAGNOSIS — E1139 Type 2 diabetes mellitus with other diabetic ophthalmic complication: Secondary | ICD-10-CM | POA: Diagnosis not present

## 2012-09-26 LAB — LIPID PANEL
Cholesterol: 175 mg/dL (ref 0–200)
HDL: 24 mg/dL — AB (ref 35–70)

## 2012-10-01 ENCOUNTER — Encounter: Payer: Self-pay | Admitting: *Deleted

## 2012-10-06 ENCOUNTER — Other Ambulatory Visit: Payer: Self-pay | Admitting: Family Medicine

## 2012-10-20 ENCOUNTER — Other Ambulatory Visit: Payer: Self-pay | Admitting: Family Medicine

## 2012-10-31 DIAGNOSIS — I129 Hypertensive chronic kidney disease with stage 1 through stage 4 chronic kidney disease, or unspecified chronic kidney disease: Secondary | ICD-10-CM | POA: Diagnosis not present

## 2012-10-31 DIAGNOSIS — E119 Type 2 diabetes mellitus without complications: Secondary | ICD-10-CM | POA: Diagnosis not present

## 2012-10-31 DIAGNOSIS — N183 Chronic kidney disease, stage 3 unspecified: Secondary | ICD-10-CM | POA: Diagnosis not present

## 2012-11-07 DIAGNOSIS — E11311 Type 2 diabetes mellitus with unspecified diabetic retinopathy with macular edema: Secondary | ICD-10-CM | POA: Diagnosis not present

## 2012-11-07 DIAGNOSIS — E119 Type 2 diabetes mellitus without complications: Secondary | ICD-10-CM | POA: Diagnosis not present

## 2012-11-07 DIAGNOSIS — E11329 Type 2 diabetes mellitus with mild nonproliferative diabetic retinopathy without macular edema: Secondary | ICD-10-CM | POA: Diagnosis not present

## 2012-11-07 DIAGNOSIS — E1139 Type 2 diabetes mellitus with other diabetic ophthalmic complication: Secondary | ICD-10-CM | POA: Diagnosis not present

## 2012-11-20 DIAGNOSIS — H3581 Retinal edema: Secondary | ICD-10-CM

## 2012-11-20 HISTORY — DX: Retinal edema: H35.81

## 2012-12-05 ENCOUNTER — Other Ambulatory Visit: Payer: Self-pay | Admitting: *Deleted

## 2012-12-05 MED ORDER — AMBULATORY NON FORMULARY MEDICATION: Status: DC

## 2012-12-08 ENCOUNTER — Other Ambulatory Visit: Payer: Self-pay | Admitting: Family Medicine

## 2012-12-17 DIAGNOSIS — Z794 Long term (current) use of insulin: Secondary | ICD-10-CM | POA: Diagnosis not present

## 2012-12-17 DIAGNOSIS — E119 Type 2 diabetes mellitus without complications: Secondary | ICD-10-CM | POA: Diagnosis not present

## 2012-12-17 DIAGNOSIS — E11311 Type 2 diabetes mellitus with unspecified diabetic retinopathy with macular edema: Secondary | ICD-10-CM | POA: Diagnosis not present

## 2012-12-17 DIAGNOSIS — E11329 Type 2 diabetes mellitus with mild nonproliferative diabetic retinopathy without macular edema: Secondary | ICD-10-CM | POA: Diagnosis not present

## 2012-12-17 DIAGNOSIS — E11319 Type 2 diabetes mellitus with unspecified diabetic retinopathy without macular edema: Secondary | ICD-10-CM | POA: Diagnosis not present

## 2012-12-17 DIAGNOSIS — E1139 Type 2 diabetes mellitus with other diabetic ophthalmic complication: Secondary | ICD-10-CM | POA: Diagnosis not present

## 2012-12-25 ENCOUNTER — Other Ambulatory Visit: Payer: Self-pay | Admitting: Family Medicine

## 2012-12-25 MED ORDER — LOSARTAN POTASSIUM 50 MG PO TABS: 50.0000 mg | ORAL_TABLET | Freq: Every day | ORAL | Status: DC

## 2012-12-25 NOTE — Progress Notes (Signed)
  Subjective:    Patient ID: Louis [name], male    DOB: Oct 31, 1946, 67 y.o.   MRN: [id number]  HPI Please call patient and let him know received a letter from his insurance company saying that he is not on an ACE or an R. These are very important for diabetics to help protect her kidneys especially since he R. he has chronic kidney disease. Per her records it looks like there was a historical 1 and her but we have never actually filled a prescription for him. Thus I will send over a new prescription for losartan which is taken once a day and his generic. Please keep scheduled followup.   Review of Systems     Objective:   Physical Exam        Assessment & Plan:

## 2012-12-25 NOTE — Progress Notes (Signed)
Called pt and spoke with his wife [name] and she stated that pt is being monitored by Dr. Marcha Solders for CKD and Dr. Vesta Mixer for DM. He has and appt with Dr. Marcha Solders tomorrow and I told her to ask about the current meds and whether or not pt is on losartan for his BP. She stated that she will discuss this with him and call back.Audelia Hives [place]

## 2012-12-26 DIAGNOSIS — N183 Chronic kidney disease, stage 3 unspecified: Secondary | ICD-10-CM | POA: Diagnosis not present

## 2012-12-26 DIAGNOSIS — N2581 Secondary hyperparathyroidism of renal origin: Secondary | ICD-10-CM | POA: Diagnosis not present

## 2012-12-26 NOTE — Progress Notes (Signed)
Spoke with Juanda Crumble wife Edd Fabian and per her had visit with kidney doc today and he is taking the Benicar- kidney doc states that this was the most effective drug for him.

## 2012-12-26 NOTE — Progress Notes (Signed)
Perfect,  i am not sure why got the letter form insurance then. I just wanted to make sure he was on one so this is perfect.

## 2013-01-09 ENCOUNTER — Other Ambulatory Visit: Payer: Self-pay | Admitting: Family Medicine

## 2013-01-09 DIAGNOSIS — L259 Unspecified contact dermatitis, unspecified cause: Secondary | ICD-10-CM | POA: Diagnosis not present

## 2013-02-21 DIAGNOSIS — E1139 Type 2 diabetes mellitus with other diabetic ophthalmic complication: Secondary | ICD-10-CM | POA: Diagnosis not present

## 2013-02-21 DIAGNOSIS — E11311 Type 2 diabetes mellitus with unspecified diabetic retinopathy with macular edema: Secondary | ICD-10-CM | POA: Diagnosis not present

## 2013-02-21 DIAGNOSIS — H5789 Other specified disorders of eye and adnexa: Secondary | ICD-10-CM | POA: Diagnosis not present

## 2013-02-21 DIAGNOSIS — Z87891 Personal history of nicotine dependence: Secondary | ICD-10-CM | POA: Diagnosis not present

## 2013-02-21 DIAGNOSIS — E11329 Type 2 diabetes mellitus with mild nonproliferative diabetic retinopathy without macular edema: Secondary | ICD-10-CM | POA: Diagnosis not present

## 2013-03-01 ENCOUNTER — Other Ambulatory Visit: Payer: Self-pay | Admitting: Family Medicine

## 2013-03-03 NOTE — Telephone Encounter (Signed)
Must make appointment 

## 2013-04-03 DIAGNOSIS — D649 Anemia, unspecified: Secondary | ICD-10-CM | POA: Diagnosis not present

## 2013-04-03 DIAGNOSIS — N183 Chronic kidney disease, stage 3 unspecified: Secondary | ICD-10-CM | POA: Diagnosis not present

## 2013-04-18 ENCOUNTER — Encounter: Payer: Self-pay | Admitting: Family Medicine

## 2013-04-25 DIAGNOSIS — E785 Hyperlipidemia, unspecified: Secondary | ICD-10-CM | POA: Diagnosis not present

## 2013-04-25 DIAGNOSIS — E1142 Type 2 diabetes mellitus with diabetic polyneuropathy: Secondary | ICD-10-CM | POA: Diagnosis not present

## 2013-04-25 DIAGNOSIS — N183 Chronic kidney disease, stage 3 unspecified: Secondary | ICD-10-CM | POA: Diagnosis not present

## 2013-04-25 DIAGNOSIS — E1149 Type 2 diabetes mellitus with other diabetic neurological complication: Secondary | ICD-10-CM | POA: Diagnosis not present

## 2013-04-25 DIAGNOSIS — I2510 Atherosclerotic heart disease of native coronary artery without angina pectoris: Secondary | ICD-10-CM | POA: Diagnosis not present

## 2013-04-25 DIAGNOSIS — E1139 Type 2 diabetes mellitus with other diabetic ophthalmic complication: Secondary | ICD-10-CM | POA: Diagnosis not present

## 2013-04-25 DIAGNOSIS — I10 Essential (primary) hypertension: Secondary | ICD-10-CM | POA: Diagnosis not present

## 2013-04-25 DIAGNOSIS — E11319 Type 2 diabetes mellitus with unspecified diabetic retinopathy without macular edema: Secondary | ICD-10-CM | POA: Diagnosis not present

## 2013-04-25 DIAGNOSIS — E1129 Type 2 diabetes mellitus with other diabetic kidney complication: Secondary | ICD-10-CM | POA: Diagnosis not present

## 2013-04-25 LAB — LIPID PANEL
CHOLESTEROL: 112 mg/dL (ref 0–200)
HDL: 29 mg/dL — AB (ref 35–70)
LDL Cholesterol: 54 mg/dL
Triglycerides: 144 mg/dL (ref 40–160)

## 2013-05-07 ENCOUNTER — Other Ambulatory Visit: Payer: Self-pay | Admitting: Family Medicine

## 2013-05-10 ENCOUNTER — Other Ambulatory Visit: Payer: Self-pay | Admitting: Family Medicine

## 2013-06-18 ENCOUNTER — Ambulatory Visit (INDEPENDENT_AMBULATORY_CARE_PROVIDER_SITE_OTHER): Payer: Medicare Other | Admitting: Cardiology

## 2013-06-18 ENCOUNTER — Encounter: Payer: Self-pay | Admitting: Cardiology

## 2013-06-18 VITALS — BP 140/80 | HR 52 | Wt 259.0 lb

## 2013-06-18 DIAGNOSIS — I2510 Atherosclerotic heart disease of native coronary artery without angina pectoris: Secondary | ICD-10-CM

## 2013-06-18 DIAGNOSIS — E785 Hyperlipidemia, unspecified: Secondary | ICD-10-CM | POA: Diagnosis not present

## 2013-06-18 DIAGNOSIS — I498 Other specified cardiac arrhythmias: Secondary | ICD-10-CM | POA: Diagnosis not present

## 2013-06-18 DIAGNOSIS — R001 Bradycardia, unspecified: Secondary | ICD-10-CM

## 2013-06-18 DIAGNOSIS — I10 Essential (primary) hypertension: Secondary | ICD-10-CM | POA: Diagnosis not present

## 2013-06-18 MED ORDER — ATORVASTATIN CALCIUM 80 MG PO TABS: 80.0000 mg | ORAL_TABLET | Freq: Every day | ORAL | Status: DC

## 2013-06-18 MED ORDER — HYDRALAZINE HCL 25 MG PO TABS: 25.0000 mg | ORAL_TABLET | Freq: Three times a day (TID) | ORAL | Status: DC

## 2013-06-18 MED ORDER — METOPROLOL SUCCINATE ER 25 MG PO TB24: ORAL_TABLET | ORAL | Status: DC

## 2013-06-18 NOTE — Assessment & Plan Note (Signed)
Decreased metoprolol as described. Because of decreased dose his blood pressure will likely increase. I will therefore increase hydralazine to 25 3 times a day. Follow blood pressure and increase medications as needed.

## 2013-06-18 NOTE — Assessment & Plan Note (Signed)
Continue aspirin. Resume statin. Begin Lipitor 80 mg daily. Check lipids and liver in 6 weeks.

## 2013-06-18 NOTE — Progress Notes (Signed)
HPI: Pleasant male for FU of CAD. Myoview performed in April 2012 revealed apical ischemia and an ejection fraction of 56%. Echocardiogram in April of 2012 showed normal LV function and mild left ventricular hypertrophy. ABIs were normal. Patient had cardiac catheterization in April of 2012 that revealed 50% distal LM stenosis. LAD - mild diffuse 20% disease proximally. The LAD is occluded in the mid segment after the second diagonal and septal branch. It fills through the very faint collaterals and overall appears to be diffusely diseased. First diagonal is a small-sized branch with a 60% ostial stenosis. Second diagonal is a relatively large size branch with 40-50% proximal disease. The third diagonal appears to have 2 branches and subtotally occluded. LCX with diffuse 40% disease in the mid segment before the takeoff of OM-3. RCA with 30% proximal discrete stenosis. There is also 20% mid stenosis. There is a 30% distal stenosis before the bifurcation. Medical therapy recommended. Since he was last seen in April 2013, the patient has dyspnea with more extreme activities but not with routine activities. It is relieved with rest. It is not associated with chest pain. There is no orthopnea, PND. There is no syncope or palpitations. There is no exertional chest pain. Some fatigue. Mild pedal edema.   Current Outpatient Prescriptions  Medication Sig Dispense Refill  . AMBULATORY NON FORMULARY MEDICATION Medication Name: diabeic shoes. Dx 250.00  1 Units  0  . AMBULATORY NON FORMULARY MEDICATION BD Ultra-Fine Pen Needles ( Nano)  64mm Gauge 32. Diagnosis: Diabetes.  Pt testing 2 times a day  100 each  5  . aspirin 81 MG tablet Take 81 mg by mouth daily.        Marland Kitchen BENICAR 40 MG tablet TAKE 1 TABLET EVERY DAY  30 tablet  5  . Cholecalciferol (VITAMIN D-3 PO) Take 1 tablet by mouth daily.      . cloNIDine (CATAPRES) 0.3 MG tablet Take 0.3 mg by mouth 3 (three) times daily.      . furosemide (LASIX) 80 MG  tablet Take 80 mg by mouth 2 (two) times daily.      . hydrALAZINE (APRESOLINE) 25 MG tablet 1/2 tab po bid      . insulin detemir (LEVEMIR) 100 UNIT/ML injection Inject into the skin at bedtime.        Marland Kitchen LEVITRA 10 MG tablet TAKE 1 TABLET EVERY DAY AS NEEDED  12 tablet  1  . metoprolol succinate (TOPROL-XL) 100 MG 24 hr tablet TAKE 1 AND 1/2 TABLETS BY MOUTH EVERY DAY  135 tablet  0  . nitroGLYCERIN (NITROSTAT) 0.4 MG SL tablet Place 0.4 mg under the tongue every 5 (five) minutes as needed.        Marland Kitchen NOVOLOG 100 UNIT/ML injection INJECT PER SSI WITH EACH MEAL.  20 mL  0  . NOVOLOG 100 UNIT/ML injection INJECT PER SSI WITH EACH MEAL.  20 mL  6   No current facility-administered medications for this visit.     Past Medical History  Diagnosis Date  . Diabetes mellitus   . Hypertension   . Hyperlipidemia   . OSA on CPAP   . Renal insufficiency   . CAD (coronary artery disease)     Past Surgical History  Procedure Laterality Date  . Pilonidal cyst excision      History   Social History  . Marital Status: Married    Spouse Name: N/A    Number of Children: 40  . Years of Education: N/A  Occupational History  . Warehouse    Social History Main Topics  . Smoking status: Former Smoker -- 1.00 packs/day for 20 years    Types: Cigarettes    Quit date: 03/06/1977  . Smokeless tobacco: Not on file  . Alcohol Use: No  . Drug Use: No  . Sexually Active: Not on file   Other Topics Concern  . Not on file   Social History Narrative  . No narrative on file    ROS: no fevers or chills, productive cough, hemoptysis, dysphasia, odynophagia, melena, hematochezia, dysuria, hematuria, rash, seizure activity, orthopnea, PND, pedal edema, claudication. Remaining systems are negative.  Physical Exam: Well-developed obese in no acute distress.  Skin is warm and dry.  HEENT is normal.  Neck is supple.  Chest is clear to auscultation with normal expansion.  Cardiovascular exam is  regular rate and rhythm.  Abdominal exam nontender or distended. No masses palpated. Extremities show trace edema. neuro grossly intact  ECG sinus bradycardia at a rate of 52. Lateral T-wave inversion. No change compared to 03/29/2012.

## 2013-06-18 NOTE — Assessment & Plan Note (Signed)
Patient was mild bradycardia and some fatigue. Decrease Toprol to 75 mg daily. Hopefully symptoms will improve.

## 2013-06-18 NOTE — Assessment & Plan Note (Signed)
Resume statin as outlined under coronary artery disease.

## 2013-06-18 NOTE — Patient Instructions (Signed)
Your physician wants you to follow-up in: [name] will receive a reminder letter in the mail two months in advance. If you don't receive a letter, please call our office to schedule the follow-up appointment.   CHANGE METOPROLOL TO 25 MG TABLETS THREE TABLETS ONCE DAILY  CHANGE HYDRALAZINE TO 25 MG THREE TIMES DAILY  START LIPITOR 77 MG ONCE DAILY  Your physician recommends that you return for lab work in: 6 WEEKS=DO NOT EAT PRIOR TO LAB WORK.

## 2013-06-20 DIAGNOSIS — Z87891 Personal history of nicotine dependence: Secondary | ICD-10-CM | POA: Diagnosis not present

## 2013-06-20 DIAGNOSIS — E1139 Type 2 diabetes mellitus with other diabetic ophthalmic complication: Secondary | ICD-10-CM | POA: Diagnosis not present

## 2013-06-20 DIAGNOSIS — E11311 Type 2 diabetes mellitus with unspecified diabetic retinopathy with macular edema: Secondary | ICD-10-CM | POA: Diagnosis not present

## 2013-06-20 DIAGNOSIS — E11329 Type 2 diabetes mellitus with mild nonproliferative diabetic retinopathy without macular edema: Secondary | ICD-10-CM | POA: Diagnosis not present

## 2013-07-31 ENCOUNTER — Encounter: Payer: Self-pay | Admitting: Family Medicine

## 2013-07-31 ENCOUNTER — Ambulatory Visit (INDEPENDENT_AMBULATORY_CARE_PROVIDER_SITE_OTHER): Payer: Medicare Other | Admitting: Family Medicine

## 2013-07-31 VITALS — BP 152/60 | HR 59 | Wt 260.0 lb

## 2013-07-31 DIAGNOSIS — N6310 Unspecified lump in the right breast, unspecified quadrant: Secondary | ICD-10-CM

## 2013-07-31 DIAGNOSIS — N63 Unspecified lump in unspecified breast: Secondary | ICD-10-CM

## 2013-07-31 DIAGNOSIS — Z23 Encounter for immunization: Secondary | ICD-10-CM

## 2013-07-31 DIAGNOSIS — N644 Mastodynia: Secondary | ICD-10-CM

## 2013-07-31 DIAGNOSIS — E669 Obesity, unspecified: Secondary | ICD-10-CM | POA: Insufficient documentation

## 2013-07-31 NOTE — Progress Notes (Signed)
  Subjective:    Patient ID: Louis [name], male    DOB: 09-25-1946, 67 y.o.   MRN: [id number]  HPI Here for right-sided chest pain- has noticed a knot under the right nipple. Only painful when hits it or touches it.  Says pain is sharp. Started a month ago.  Seat belt bothers it. No redness or rash.  No other bumps. No breast cancer in the family   Review of Systems     Objective:   Physical Exam  Constitutional: He is oriented to person, place, and time. He appears well-developed and well-nourished.  Neurological: He is alert and oriented to person, place, and time.  Skin: Skin is warm and dry.  Psychiatric: He has a normal mood and affect. His behavior is normal.     Under the left nipple he does have a slight ridge of tissue just at the upper border of the area low. It is tender. It is not mobile. No rash or redness around the area. No other palpable lesions in the breast or axilla. No palpable lesions under the left nipple.     Assessment & Plan:  Knot under nipple, on right- unclear etiology. At first I thought he was describing a breast but. Certain medications can cause this. It is definitely feels irregular. Though I do not feel a discrete mass. I recommend referral to the breast Center for ultrasound and imaging. For now just avoid any trauma to the area and please call me if there is any change in skin color or develops any rash.  Flu vaccine given.

## 2013-08-04 ENCOUNTER — Ambulatory Visit
Admission: RE | Admit: 2013-08-04 | Discharge: 2013-08-04 | Disposition: A | Discharge status: Home / Self Care | Payer: Medicare Other | Source: Ambulatory Visit | Attending: Family Medicine | Admitting: Family Medicine

## 2013-08-04 DIAGNOSIS — N644 Mastodynia: Secondary | ICD-10-CM

## 2013-08-04 DIAGNOSIS — N6310 Unspecified lump in the right breast, unspecified quadrant: Secondary | ICD-10-CM

## 2013-08-04 DIAGNOSIS — N62 Hypertrophy of breast: Secondary | ICD-10-CM | POA: Diagnosis not present

## 2013-08-14 DIAGNOSIS — N2581 Secondary hyperparathyroidism of renal origin: Secondary | ICD-10-CM | POA: Diagnosis not present

## 2013-08-14 DIAGNOSIS — Z8349 Family history of other endocrine, nutritional and metabolic diseases: Secondary | ICD-10-CM | POA: Diagnosis not present

## 2013-08-14 DIAGNOSIS — D649 Anemia, unspecified: Secondary | ICD-10-CM | POA: Diagnosis not present

## 2013-08-14 DIAGNOSIS — R5381 Other malaise: Secondary | ICD-10-CM | POA: Diagnosis not present

## 2013-08-14 DIAGNOSIS — N183 Chronic kidney disease, stage 3 unspecified: Secondary | ICD-10-CM | POA: Diagnosis not present

## 2013-08-22 ENCOUNTER — Encounter: Payer: Self-pay | Admitting: *Deleted

## 2013-08-22 DIAGNOSIS — E785 Hyperlipidemia, unspecified: Secondary | ICD-10-CM | POA: Diagnosis not present

## 2013-08-22 DIAGNOSIS — I10 Essential (primary) hypertension: Secondary | ICD-10-CM | POA: Diagnosis not present

## 2013-08-22 DIAGNOSIS — I2510 Atherosclerotic heart disease of native coronary artery without angina pectoris: Secondary | ICD-10-CM | POA: Diagnosis not present

## 2013-08-22 DIAGNOSIS — I498 Other specified cardiac arrhythmias: Secondary | ICD-10-CM | POA: Diagnosis not present

## 2013-08-22 LAB — LIPID PANEL
Cholesterol: 113 mg/dL (ref 0–200)
LDL Cholesterol: 48 mg/dL (ref 0–99)
Total CHOL/HDL Ratio: 4.3 Ratio
VLDL: 39 mg/dL (ref 0–40)

## 2013-08-22 LAB — HEPATIC FUNCTION PANEL
AST: 12 U/L (ref 0–37)
Albumin: 3.7 g/dL (ref 3.5–5.2)
Alkaline Phosphatase: 105 U/L (ref 39–117)
Total Protein: 6.6 g/dL (ref 6.0–8.3)

## 2013-09-25 ENCOUNTER — Other Ambulatory Visit: Payer: Self-pay

## 2013-10-23 DIAGNOSIS — E11319 Type 2 diabetes mellitus with unspecified diabetic retinopathy without macular edema: Secondary | ICD-10-CM | POA: Diagnosis not present

## 2013-10-23 DIAGNOSIS — I2510 Atherosclerotic heart disease of native coronary artery without angina pectoris: Secondary | ICD-10-CM | POA: Diagnosis not present

## 2013-10-23 DIAGNOSIS — E1149 Type 2 diabetes mellitus with other diabetic neurological complication: Secondary | ICD-10-CM | POA: Diagnosis not present

## 2013-10-23 DIAGNOSIS — E1129 Type 2 diabetes mellitus with other diabetic kidney complication: Secondary | ICD-10-CM | POA: Diagnosis not present

## 2013-10-23 DIAGNOSIS — E1142 Type 2 diabetes mellitus with diabetic polyneuropathy: Secondary | ICD-10-CM | POA: Diagnosis not present

## 2013-10-23 DIAGNOSIS — E1139 Type 2 diabetes mellitus with other diabetic ophthalmic complication: Secondary | ICD-10-CM | POA: Diagnosis not present

## 2013-10-23 DIAGNOSIS — E785 Hyperlipidemia, unspecified: Secondary | ICD-10-CM | POA: Diagnosis not present

## 2013-10-23 DIAGNOSIS — N183 Chronic kidney disease, stage 3 unspecified: Secondary | ICD-10-CM | POA: Diagnosis not present

## 2013-10-23 DIAGNOSIS — I10 Essential (primary) hypertension: Secondary | ICD-10-CM | POA: Diagnosis not present

## 2013-10-23 LAB — HEPATIC FUNCTION PANEL
ALT: 11 U/L (ref 10–40)
AST: 16 U/L (ref 14–40)

## 2013-10-23 LAB — HEMOGLOBIN A1C: HEMOGLOBIN A1C: 7.8 % — AB (ref 4.0–6.0)

## 2013-10-31 ENCOUNTER — Telehealth: Payer: Self-pay | Admitting: Family Medicine

## 2013-10-31 ENCOUNTER — Encounter: Payer: Self-pay | Admitting: Family Medicine

## 2013-10-31 ENCOUNTER — Ambulatory Visit (INDEPENDENT_AMBULATORY_CARE_PROVIDER_SITE_OTHER): Payer: Medicare Other | Admitting: Family Medicine

## 2013-10-31 ENCOUNTER — Other Ambulatory Visit: Payer: Self-pay | Admitting: *Deleted

## 2013-10-31 VITALS — BP 126/55 | HR 74 | Temp 97.0°F | Ht 68.0 in | Wt 266.0 lb

## 2013-10-31 DIAGNOSIS — G5600 Carpal tunnel syndrome, unspecified upper limb: Secondary | ICD-10-CM

## 2013-10-31 DIAGNOSIS — E119 Type 2 diabetes mellitus without complications: Secondary | ICD-10-CM

## 2013-10-31 DIAGNOSIS — Z1211 Encounter for screening for malignant neoplasm of colon: Secondary | ICD-10-CM

## 2013-10-31 DIAGNOSIS — E11311 Type 2 diabetes mellitus with unspecified diabetic retinopathy with macular edema: Secondary | ICD-10-CM | POA: Insufficient documentation

## 2013-10-31 DIAGNOSIS — E11319 Type 2 diabetes mellitus with unspecified diabetic retinopathy without macular edema: Secondary | ICD-10-CM | POA: Insufficient documentation

## 2013-10-31 MED ORDER — MELOXICAM 15 MG PO TABS: 15.0000 mg | ORAL_TABLET | Freq: Every day | ORAL | Status: DC

## 2013-10-31 NOTE — Progress Notes (Signed)
Subjective:    Patient ID: Louis [name], male    DOB: Oct 11, 1946, 66 y.o.   MRN: [id number]  HPI Having bilat hand pain. Thought it was from his DM but spoke with his endocrin and they said maybe carpal tunnel. Hands will fall asleep at night.  Started about 3 years ago.  Finger tips feel like they are on fire except his 5th digit. They feel cold but when touches htem to his face they are warm. Will go numb/tingling when driving.   DM- Sees Dr. Tod Persia. Eye exam is up to date. Just has exam done last week. Says his A1c was down to 7.7 which is fantastic. It was down from 7.9.   Review of Systems  BP 126/55  Pulse 74  Temp(Src) 97 F (36.1 C)  Ht 5\' 8"  (1.727 m)  Wt 266 lb (120.657 kg)  BMI 40.45 kg/m2    No Known Allergies  Past Medical History  Diagnosis Date  . Diabetes mellitus   . Hypertension   . Hyperlipidemia   . OSA on CPAP   . Renal insufficiency   . CAD (coronary artery disease)     Past Surgical History  Procedure Laterality Date  . Pilonidal cyst excision      History   Social History  . Marital Status: Married    Spouse Name: N/A    Number of Children: 59  . Years of Education: N/A   Occupational History  . Warehouse    Social History Main Topics  . Smoking status: Former Smoker -- 1.00 packs/day for 20 years    Types: Cigarettes    Quit date: 03/06/1977  . Smokeless tobacco: Not on file  . Alcohol Use: No  . Drug Use: No  . Sexual Activity: Not on file   Other Topics Concern  . Not on file   Social History Narrative  . No narrative on file    Family History  Problem Relation Age of Onset  . Diabetes Mother   . Coronary artery disease Father 16    CABG  . Diabetes Father     Outpatient Encounter Prescriptions as of 10/31/2013  Medication Sig  . AMBULATORY NON FORMULARY MEDICATION Medication Name: diabeic shoes. Dx 250.00  . AMBULATORY NON FORMULARY MEDICATION BD Ultra-Fine Pen Needles ( Nano)  32mm Gauge 32. Diagnosis:  Diabetes.  Pt testing 2 times a day  . aspirin 81 MG tablet Take 81 mg by mouth daily.    Marland Kitchen atorvastatin (LIPITOR) 80 MG tablet Take 1 tablet (80 mg total) by mouth daily.  Marland Kitchen BENICAR 40 MG tablet TAKE 1 TABLET EVERY DAY  . Cholecalciferol (VITAMIN D-3 PO) Take 1 tablet by mouth daily.  . cloNIDine (CATAPRES) 0.3 MG tablet Take 0.3 mg by mouth 3 (three) times daily.  . furosemide (LASIX) 80 MG tablet Take 80 mg by mouth 2 (two) times daily.  . hydrALAZINE (APRESOLINE) 25 MG tablet Take 1 tablet (25 mg total) by mouth 3 (three) times daily.  . insulin detemir (LEVEMIR) 100 UNIT/ML injection Inject into the skin at bedtime.    Marland Kitchen LEVITRA 10 MG tablet TAKE 1 TABLET EVERY DAY AS NEEDED  . metoprolol succinate (TOPROL-XL) 25 MG 24 hr tablet TAKE THREE TABLETS ONCE DAILY Take with or immediately following a meal.  . nitroGLYCERIN (NITROSTAT) 0.4 MG SL tablet Place 0.4 mg under the tongue every 5 (five) minutes as needed.    Marland Kitchen NOVOLOG 100 UNIT/ML injection INJECT PER SSI WITH EACH MEAL.  Marland Kitchen  meloxicam (MOBIC) 15 MG tablet Take 1 tablet (15 mg total) by mouth daily.          Objective:   Physical Exam  Constitutional: He is oriented to person, place, and time. He appears well-developed and well-nourished.  HENT:  Head: Normocephalic and atraumatic.  Musculoskeletal:  Wrist with fairly normal range of motion though slightly decreased flexion bilaterally. Negative Tinel's and Phalen's sign. Strength at the wrist and fingers is 5 out of 5 bilaterally. Radial pulse 2+ bilaterally.  Neurological: He is alert and oriented to person, place, and time.  Skin: Skin is warm and dry.  Psychiatric: He has a normal mood and affect. His behavior is normal.          Assessment & Plan:  Carpal tunnel syndrome bilaterally-start with bilateral cockup splints at night. Followup in 3-4 weeks if not improving. We'll also put him on anti-inflammatory for the next 2 weeks. Stop immediately if any GI upset or  irritation.  Diabetes-given samples of NovoLog 7030.   Will call [facility] for last colonoscopy report. He believes Dr. Shanda Bumps did it.  We'll also call [facility] for last diabetic eye exam.

## 2013-10-31 NOTE — Telephone Encounter (Signed)
[name] informed and she stated to send pt to [place] to dr.gessner. She will inform him of this.Audelia Hives [place]

## 2013-10-31 NOTE — Patient Instructions (Signed)
Carpal Tunnel Syndrome The carpal tunnel is a narrow area located on the palm side of your wrist. The tunnel is formed by the wrist bones and ligaments. Nerves, blood vessels, and tendons pass through the carpal tunnel. Repeated wrist motion or certain diseases may cause swelling within the tunnel. This swelling pinches the main nerve in the wrist (median nerve) and causes the painful hand and arm condition called carpal tunnel syndrome. CAUSES   Repeated wrist motions.  Wrist injuries.  Certain diseases like arthritis, diabetes, alcoholism, hyperthyroidism, and kidney failure.  Obesity.  Pregnancy. SYMPTOMS   A "pins and needles" feeling in your fingers or hand.  Tingling or numbness in your fingers or hand.  An aching feeling in your entire arm.  Wrist pain that goes up your arm to your shoulder.  Pain that goes down into your palm or fingers.  A weak feeling in your hands. DIAGNOSIS  Your caregiver will take your history and perform a physical exam. An electromyography test may be needed. This test measures electrical signals sent out by the muscles. The electrical signals are usually slowed by carpal tunnel syndrome. You may also need X-rays. TREATMENT  Carpal tunnel syndrome may clear up by itself. Your caregiver may recommend a wrist splint or medicine such as a nonsteroidal anti-inflammatory medicine. Cortisone injections may help. Sometimes, surgery may be needed to free the pinched nerve.  HOME CARE INSTRUCTIONS   Take all medicine as directed by your caregiver. Only take over-the-counter or prescription medicines for pain, discomfort, or fever as directed by your caregiver.  If you were given a splint to keep your wrist from bending, wear it as directed. It is important to wear the splint at night. Wear the splint for as long as you have pain or numbness in your hand, arm, or wrist. This may take 1 to 2 months.  Rest your wrist from any activity that may be causing your  pain. If your symptoms are work-related, you may need to talk to your employer about changing to a job that does not require using your wrist.  Put ice on your wrist after long periods of wrist activity.  Put ice in a plastic bag.  Place a towel between your skin and the bag.  Leave the ice on for 15-20 minutes, 03-04 times a day.  Keep all follow-up visits as directed by your caregiver. This includes any orthopedic referrals, physical therapy, and rehabilitation. Any delay in getting necessary care could result in a delay or failure of your condition to heal. SEEK IMMEDIATE MEDICAL CARE IF:   You have new, unexplained symptoms.  Your symptoms get worse and are not helped or controlled with medicines. MAKE SURE YOU:   Understand these instructions.  Will watch your condition.  Will get help right away if you are not doing well or get worse. Document Released: 11/03/2000 Document Revised: 01/29/2012 Document Reviewed: 09/22/2011 [employer] Patient Information 2014 [employer].  

## 2013-10-31 NOTE — Telephone Encounter (Signed)
Please call patient: We were able to find his last day. It was done in 2004 which means it has been 10 years exactly. He needs an appointment for screening colonoscopy. If he has a preference for where he would like to go then please let me know.

## 2013-11-02 NOTE — Telephone Encounter (Signed)
Order placed. Please let Anderson Malta know to specifically request Dr. Carlean Purl

## 2013-11-03 NOTE — Telephone Encounter (Signed)
Anderson Malta informed. Placed copy of this on her desk.Louis [name]

## 2013-11-20 DIAGNOSIS — I509 Heart failure, unspecified: Secondary | ICD-10-CM

## 2013-11-20 HISTORY — DX: Heart failure, unspecified: I50.9

## 2013-12-04 ENCOUNTER — Other Ambulatory Visit: Payer: Self-pay | Admitting: Family Medicine

## 2013-12-18 DIAGNOSIS — N183 Chronic kidney disease, stage 3 unspecified: Secondary | ICD-10-CM | POA: Diagnosis not present

## 2013-12-18 DIAGNOSIS — E1129 Type 2 diabetes mellitus with other diabetic kidney complication: Secondary | ICD-10-CM | POA: Diagnosis not present

## 2013-12-18 DIAGNOSIS — R809 Proteinuria, unspecified: Secondary | ICD-10-CM | POA: Diagnosis not present

## 2013-12-18 DIAGNOSIS — I129 Hypertensive chronic kidney disease with stage 1 through stage 4 chronic kidney disease, or unspecified chronic kidney disease: Secondary | ICD-10-CM | POA: Diagnosis not present

## 2014-01-08 ENCOUNTER — Ambulatory Visit (AMBULATORY_SURGERY_CENTER): Payer: Self-pay

## 2014-01-08 VITALS — Ht 68.0 in | Wt 260.0 lb

## 2014-01-08 DIAGNOSIS — Z1211 Encounter for screening for malignant neoplasm of colon: Secondary | ICD-10-CM

## 2014-01-08 MED ORDER — SUPREP BOWEL PREP KIT 17.5-3.13-1.6 GM/177ML PO SOLN: 1.0000 | Freq: Once | ORAL | Status: DC

## 2014-01-09 ENCOUNTER — Ambulatory Visit (INDEPENDENT_AMBULATORY_CARE_PROVIDER_SITE_OTHER): Payer: Medicare Other | Admitting: Sports Medicine

## 2014-01-09 ENCOUNTER — Encounter: Payer: Self-pay | Admitting: Sports Medicine

## 2014-01-09 VITALS — BP 136/65 | HR 55 | Ht 68.0 in | Wt 263.0 lb

## 2014-01-09 DIAGNOSIS — G5600 Carpal tunnel syndrome, unspecified upper limb: Secondary | ICD-10-CM | POA: Diagnosis not present

## 2014-01-09 DIAGNOSIS — G5602 Carpal tunnel syndrome, left upper limb: Secondary | ICD-10-CM

## 2014-01-09 DIAGNOSIS — G5603 Carpal tunnel syndrome, bilateral upper limbs: Secondary | ICD-10-CM | POA: Insufficient documentation

## 2014-01-09 NOTE — Progress Notes (Signed)
   Subjective:    I'm seeing this patient as a consultation for: Dr. Madilyn Fireman  CC: Wrist pain  HPI:  Patient is a pleasant 68 yo male with a history of diabetes and chronic kidney disease presents complaining of bilateral wrist pain and numbness and tingling in the 2nd, 3rd and 4th fingers. His symptoms are more prominent on the left than the right. The numbness and tingling has been going on for a few years. He feels his thumbs have not been affected. The wrist pain is described as a intermittent shooting pain that started 3 months ago. 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 He has tried a muscle relaxant that has helped with his pain. He has not tried NSAIDS due to his renal insufficiency. He has also tried wearing a splint at night which has not improved his symptoms. Has never been seen before or had any imaging for these symptoms.  Past medical history, Surgical history, Family history not pertinant except as noted below, Social history, Allergies, and medications have been entered into the medical record, reviewed, and no changes needed.   Review of Systems: No headache, visual changes, nausea, vomiting, diarrhea, constipation, dizziness, abdominal pain, skin rash, fevers, chills, night sweats, weight loss, swollen lymph nodes, body aches, joint swelling, muscle aches, chest pain, shortness of breath, mood changes, visual or auditory hallucinations.   Objective:   General: Well Developed, well nourished, and in no acute distress.  Neuro/Psych: Alert and oriented x3, extra-ocular muscles intact, able to move all 4 extremities, sensation grossly intact. Skin: Warm and dry, no rashes noted.  Respiratory: Not using accessory muscles, speaking in full sentences, trachea midline.  Cardiovascular: Pulses palpable, no extremity edema. Abdomen: Does not appear distended. Wrist/Hand: Decreased range of motion on the left. Pain with  wrist extension and flexion. Positive Phalen and Tinel sign.   Procedure: Real-time Ultrasound Guided hydrodissection of carpal tunnel left. Device: GE Logiq E  Verbal informed consent obtained.  Time-out conducted.  Noted no overlying erythema, induration, or other signs of local infection.  Skin prepped in a sterile fashion.  Local anesthesia: Topical Ethyl chloride.  With sterile technique and under real time ultrasound guidance:  Needle was advanced into the carpal tunnel taking care to avoid the radial artery and its dorsal branch, I then used approximately 1 cc Kenalog 40, 4 cc lidocaine to injected and dissect the median nerve from the surrounding structures. No paresthesias were felt during the procedure, and appropriate numbness in the median nerve distribution was noted afterwards. Completed without difficulty  Pain immediately resolved suggesting accurate placement of the medication.  Advised to call if fevers/chills, erythema, induration, drainage, or persistent bleeding.  Images permanently stored and available for review in the ultrasound unit.  Impression: Technically successful ultrasound guided injection.  Impression and Recommendations:   This case required medical decision making of moderate complexity.

## 2014-01-09 NOTE — Assessment & Plan Note (Signed)
Unfortunately has failed conservative measures in the form of analgesics and nighttime splinting. Median nerve hydrodissection performed today. Return to see me in 3 weeks to reevaluate symptoms, continue nighttime splinting.

## 2014-01-22 ENCOUNTER — Encounter: Payer: Self-pay | Admitting: Internal Medicine

## 2014-01-22 ENCOUNTER — Ambulatory Visit (AMBULATORY_SURGERY_CENTER): Payer: Medicare Other | Admitting: Internal Medicine

## 2014-01-22 VITALS — BP 167/76 | HR 54 | Temp 96.6°F | Resp 28 | Ht 68.0 in | Wt 260.0 lb

## 2014-01-22 DIAGNOSIS — D126 Benign neoplasm of colon, unspecified: Secondary | ICD-10-CM

## 2014-01-22 DIAGNOSIS — E119 Type 2 diabetes mellitus without complications: Secondary | ICD-10-CM | POA: Diagnosis not present

## 2014-01-22 DIAGNOSIS — Z1211 Encounter for screening for malignant neoplasm of colon: Secondary | ICD-10-CM

## 2014-01-22 DIAGNOSIS — K5730 Diverticulosis of large intestine without perforation or abscess without bleeding: Secondary | ICD-10-CM | POA: Diagnosis not present

## 2014-01-22 DIAGNOSIS — G4733 Obstructive sleep apnea (adult) (pediatric): Secondary | ICD-10-CM | POA: Diagnosis not present

## 2014-01-22 DIAGNOSIS — I2510 Atherosclerotic heart disease of native coronary artery without angina pectoris: Secondary | ICD-10-CM | POA: Diagnosis not present

## 2014-01-22 LAB — GLUCOSE, CAPILLARY
GLUCOSE-CAPILLARY: 196 mg/dL — AB (ref 70–99)
Glucose-Capillary: 192 mg/dL — ABNORMAL HIGH (ref 70–99)

## 2014-01-22 MED ORDER — SODIUM CHLORIDE 0.9 % IV SOLN: 500.0000 mL | INTRAVENOUS | Status: DC

## 2014-01-22 NOTE — Progress Notes (Signed)
A/ox3 pleased with MAC, report to Suzanne RN 

## 2014-01-22 NOTE — Progress Notes (Signed)
Called to room to assist during endoscopic procedure.  Patient ID and intended procedure confirmed with present staff. Received instructions for my participation in the procedure from the performing physician.  

## 2014-01-22 NOTE — Patient Instructions (Addendum)
I found and removed 7 polyps - 6 were recovered but 1 tiny polyp was not picked up.  You also have a condition called diverticulosis - common and not usually a problem. Please read the handout provided.  I will let you know pathology results and when to have another routine colonoscopy by mail.  I appreciate the opportunity to care for you. Gatha Mayer, MD, FACG YOU HAD AN ENDOSCOPIC PROCEDURE TODAY AT [facility]: Refer to the procedure report that was given to you for any specific questions about what was found during the examination.  If the procedure report does not answer your questions, please call your gastroenterologist to clarify.  If you requested that your care partner not be given the details of your procedure findings, then the procedure report has been included in a sealed envelope for you to review at your convenience later.  YOU SHOULD EXPECT: Some feelings of bloating in the abdomen. Passage of more gas than usual.  Walking can help get rid of the air that was put into your GI tract during the procedure and reduce the bloating. If you had a lower endoscopy (such as a colonoscopy or flexible sigmoidoscopy) you may notice spotting of blood in your stool or on the toilet paper. If you underwent a bowel prep for your procedure, then you may not have a normal bowel movement for a few days.  DIET: Your first meal following the procedure should be a light meal and then it is ok to progress to your normal diet.  A half-sandwich or bowl of soup is an example of a good first meal.  Heavy or fried foods are harder to digest and may make you feel nauseous or bloated.  Likewise meals heavy in dairy and vegetables can cause extra gas to form and this can also increase the bloating.  Drink plenty of fluids but you should avoid alcoholic beverages for 24 hours.  ACTIVITY: Your care partner should take you home directly after the procedure.  You should plan to take it easy, moving  slowly for the rest of the day.  You can resume normal activity the day after the procedure however you should NOT DRIVE or use heavy machinery for 24 hours (because of the sedation medicines used during the test).    SYMPTOMS TO REPORT IMMEDIATELY: A gastroenterologist can be reached at any hour.  During normal business hours, 8:30 AM to 5:00 PM Monday through Friday, call [phone].  After hours and on weekends, please call the GI answering service at [phone] who will take a message and have the physician on call contact you.   Following lower endoscopy (colonoscopy or flexible sigmoidoscopy):  Excessive amounts of blood in the stool  Significant tenderness or worsening of abdominal pains  Swelling of the abdomen that is new, acute  Fever of 100F or higher  FOLLOW UP: If any biopsies were taken you will be contacted by phone or by letter within the next 1-3 weeks.  Call your gastroenterologist if you have not heard about the biopsies in 3 weeks.  Our staff will call the home number listed on your records the next business day following your procedure to check on you and address any questions or concerns that you may have at that time regarding the information given to you following your procedure. This is a courtesy call and so if there is no answer at the home number and we have not heard from you through  the emergency physician on call, we will assume that you have returned to your regular daily activities without incident.  SIGNATURES/CONFIDENTIALITY: You and/or your care partner have signed paperwork which will be entered into your electronic medical record.  These signatures attest to the fact that that the information above on your After Visit Summary has been reviewed and is understood.  Full responsibility of the confidentiality of this discharge information lies with you and/or your care-partner.

## 2014-01-22 NOTE — Op Note (Addendum)
[facility]  [address] [place], [id number]   COLONOSCOPY PROCEDURE REPORT  PATIENT: Louis [name], Louis [name]  MR#: [id number] BIRTHDATE: 1946/06/19 , 67  yrs. old GENDER: Male ENDOSCOPIST: Gatha Mayer, MD, [facility] REFERRED [id number] Madilyn Fireman, M.D. PROCEDURE DATE:  01/22/2014 PROCEDURE:   Colonoscopy with snare polypectomy First Screening Colonoscopy - Avg.  risk and is 50 yrs.  old or older Yes.  Prior Negative Screening - Now for repeat screening. N/A  History of Adenoma - Now for follow-up colonoscopy & has been > or = to 3 yrs.  N/A  Polyps Removed Today? Yes. ASA CLASS:   Class III INDICATIONS:average risk screening and first colonoscopy. MEDICATIONS: propofol (Diprivan) 250mg  IV, MAC sedation, administered by CRNA, and These medications were titrated to patient response per physician's verbal order  DESCRIPTION OF PROCEDURE:   After the risks benefits and alternatives of the procedure were thoroughly explained, informed consent was obtained.  A digital rectal exam revealed no abnormalities of the rectum, A digital rectal exam revealed no prostatic nodules, and A digital rectal exam revealed the prostate was not enlarged.   The LB [id number]  endoscope was introduced through the anus and advanced to the cecum, which was identified by both the appendix and ileocecal valve. No adverse events experienced.   The quality of the prep was Suprep good  The instrument was then slowly withdrawn as the colon was fully examined.      COLON FINDINGS: Seven sessile polyps measuring 3-8 mm in size were found at the cecum, in the ascending colon, transverse colon, and descending colon.  A polypectomy was performed with a cold snare. The resection was complete and the polyp tissue was partially retrieved.   Moderate diverticulosis was noted in the sigmoid colon.   The colon mucosa was otherwise normal.   A right colon retroflexion was performed.  Retroflexed views  revealed no abnormalities. The time to cecum=3 minutes 42 seconds.  Withdrawal time=15 minutes 08 seconds.  The scope was withdrawn and the procedure completed. COMPLICATIONS: There were no complications.  ENDOSCOPIC IMPRESSION: 1.   Seven sessile polyps measuring 3-8 mm in size were found at the cecum, in the ascending colon, transverse colon, and descending colon; polypectomy was performed with a cold snare 6 of 7 polyps recovered (one 3-4 mm poly not retrieved) 2.   Moderate diverticulosis was noted in the sigmoid colon 3.   The colon mucosa was otherwise normal - good prep  RECOMMENDATIONS: Timing of repeat colonoscopy will be determined by pathology findings.   eSigned:  Gatha Mayer, MD, [facility] 01/22/2014 12:28 PM Revised: 01/22/2014 12:28 PM  cc: [name], MD and The Patient   PATIENT NAME:  Ardarius, Haasch MR#: [id number]

## 2014-01-23 ENCOUNTER — Telehealth: Payer: Self-pay | Admitting: *Deleted

## 2014-01-23 ENCOUNTER — Ambulatory Visit: Payer: Medicare Other | Admitting: Family Medicine

## 2014-01-23 NOTE — Telephone Encounter (Signed)
  Follow up Call-  Call back number 01/22/2014  Post procedure Call Back phone  # [phone]  Permission to leave phone message Yes     Patient questions:  Do you have a fever, pain , or abdominal swelling? no Pain Score  0 *  Have you tolerated food without any problems? yes  Have you been able to return to your normal activities? yes  Do you have any questions about your discharge instructions: Diet   no Medications  no Follow up visit  no  Do you have questions or concerns about your Care? no  Actions: * If pain score is 4 or above: No action needed, pain <4.

## 2014-01-28 ENCOUNTER — Encounter: Payer: Self-pay | Admitting: Internal Medicine

## 2014-01-28 DIAGNOSIS — Z860100 Personal history of colon polyps, unspecified: Secondary | ICD-10-CM

## 2014-01-28 HISTORY — DX: Personal history of colon polyps, unspecified: Z86.0100

## 2014-01-28 HISTORY — DX: Personal history of colonic polyps: Z86.010

## 2014-01-28 NOTE — Progress Notes (Signed)
Quick Note:  5 tubular adenomas - max 8 mm - repeat colonoscopy 2018 ______

## 2014-01-30 ENCOUNTER — Encounter: Payer: Self-pay | Admitting: Sports Medicine

## 2014-01-30 ENCOUNTER — Ambulatory Visit (INDEPENDENT_AMBULATORY_CARE_PROVIDER_SITE_OTHER): Payer: Medicare Other | Admitting: Sports Medicine

## 2014-01-30 VITALS — BP 125/62 | HR 54 | Ht 68.0 in | Wt 261.0 lb

## 2014-01-30 DIAGNOSIS — G5603 Carpal tunnel syndrome, bilateral upper limbs: Secondary | ICD-10-CM

## 2014-01-30 DIAGNOSIS — G5600 Carpal tunnel syndrome, unspecified upper limb: Secondary | ICD-10-CM | POA: Diagnosis not present

## 2014-01-30 MED ORDER — GABAPENTIN 300 MG PO CAPS: ORAL_CAPSULE | ORAL | Status: DC

## 2014-01-30 NOTE — Progress Notes (Signed)
  Subjective:    CC: Recheck carpal tunnel  HPI: This is a pleasant 68 year old male, he has bilateral carpal tunnel symptoms. At the last visit I performed a left-sided median nerve hydrodissection under guidance, he had a transient response for 5 days but unfortunately pain came back. We have not yet treated the right side. Symptoms are moderate, persistent.  Past medical history, Surgical history, Family history not pertinant except as noted below, Social history, Allergies, and medications have been entered into the medical record, reviewed, and no changes needed.   Review of Systems: No fevers, chills, night sweats, weight loss, chest pain, or shortness of breath.   Objective:    General: Well Developed, well nourished, and in no acute distress.  Neuro: Alert and oriented x3, extra-ocular muscles intact, sensation grossly intact.  HEENT: Normocephalic, atraumatic, pupils equal round reactive to light, neck supple, no masses, no lymphadenopathy, thyroid nonpalpable.  Skin: Warm and dry, no rashes. Cardiac: Regular rate and rhythm, no murmurs rubs or gallops, no lower extremity edema.  Respiratory: Clear to auscultation bilaterally. Not using accessory muscles, speaking in full sentences.  Procedure: Real-time Ultrasound Guided Injection of right carpal tunnel Device: GE Logiq E  Verbal informed consent obtained.  Time-out conducted.  Noted no overlying erythema, induration, or other signs of local infection.  Skin prepped in a sterile fashion.  Local anesthesia: Topical Ethyl chloride.  With sterile technique and under real time ultrasound guidance:  Total of 1cc kenalog 40, 4cc lidocaine injected easily into the carpal tunnel, both superficial to, and deep to the median nerve. Completed without difficulty  Pain immediately resolved suggesting accurate placement of the medication.  Advised to call if fevers/chills, erythema, induration, drainage, or persistent bleeding.  Images  permanently stored and available for review in the ultrasound unit.  Impression: Technically successful ultrasound guided injection.  Impression and Recommendations:

## 2014-01-30 NOTE — Assessment & Plan Note (Signed)
Temporary response to a left-sided median nerve hydrodissection. Right-sided median nerve hydrodissection as above. Referral to Dr. Grandville Silos with hand surgery for consideration of the left-sided carpal tunnel release. He does desire to defer surgery until the fall if needed. In the meantime I am going to add gabapentin for symptom relief.

## 2014-02-12 DIAGNOSIS — G5600 Carpal tunnel syndrome, unspecified upper limb: Secondary | ICD-10-CM | POA: Diagnosis not present

## 2014-02-13 DIAGNOSIS — I129 Hypertensive chronic kidney disease with stage 1 through stage 4 chronic kidney disease, or unspecified chronic kidney disease: Secondary | ICD-10-CM | POA: Diagnosis not present

## 2014-02-13 DIAGNOSIS — R809 Proteinuria, unspecified: Secondary | ICD-10-CM | POA: Diagnosis not present

## 2014-02-13 DIAGNOSIS — N183 Chronic kidney disease, stage 3 unspecified: Secondary | ICD-10-CM | POA: Diagnosis not present

## 2014-02-13 DIAGNOSIS — E1129 Type 2 diabetes mellitus with other diabetic kidney complication: Secondary | ICD-10-CM | POA: Diagnosis not present

## 2014-02-13 DIAGNOSIS — G5600 Carpal tunnel syndrome, unspecified upper limb: Secondary | ICD-10-CM | POA: Diagnosis not present

## 2014-02-27 ENCOUNTER — Ambulatory Visit: Payer: Medicare Other | Admitting: Sports Medicine

## 2014-03-06 DIAGNOSIS — M25539 Pain in unspecified wrist: Secondary | ICD-10-CM | POA: Diagnosis not present

## 2014-03-06 DIAGNOSIS — L02519 Cutaneous abscess of unspecified hand: Secondary | ICD-10-CM | POA: Diagnosis not present

## 2014-03-06 DIAGNOSIS — L03119 Cellulitis of unspecified part of limb: Secondary | ICD-10-CM | POA: Diagnosis not present

## 2014-03-27 DIAGNOSIS — I129 Hypertensive chronic kidney disease with stage 1 through stage 4 chronic kidney disease, or unspecified chronic kidney disease: Secondary | ICD-10-CM | POA: Diagnosis not present

## 2014-03-27 DIAGNOSIS — E1129 Type 2 diabetes mellitus with other diabetic kidney complication: Secondary | ICD-10-CM | POA: Diagnosis not present

## 2014-03-27 DIAGNOSIS — R809 Proteinuria, unspecified: Secondary | ICD-10-CM | POA: Diagnosis not present

## 2014-03-27 DIAGNOSIS — N183 Chronic kidney disease, stage 3 unspecified: Secondary | ICD-10-CM | POA: Diagnosis not present

## 2014-05-01 DIAGNOSIS — E1142 Type 2 diabetes mellitus with diabetic polyneuropathy: Secondary | ICD-10-CM | POA: Diagnosis not present

## 2014-05-01 DIAGNOSIS — E1139 Type 2 diabetes mellitus with other diabetic ophthalmic complication: Secondary | ICD-10-CM | POA: Diagnosis not present

## 2014-05-01 DIAGNOSIS — E1129 Type 2 diabetes mellitus with other diabetic kidney complication: Secondary | ICD-10-CM | POA: Diagnosis not present

## 2014-05-01 DIAGNOSIS — E1149 Type 2 diabetes mellitus with other diabetic neurological complication: Secondary | ICD-10-CM | POA: Diagnosis not present

## 2014-05-01 DIAGNOSIS — I2510 Atherosclerotic heart disease of native coronary artery without angina pectoris: Secondary | ICD-10-CM | POA: Diagnosis not present

## 2014-05-01 DIAGNOSIS — I2583 Coronary atherosclerosis due to lipid rich plaque: Secondary | ICD-10-CM | POA: Diagnosis not present

## 2014-05-11 ENCOUNTER — Encounter: Payer: Self-pay | Admitting: Family Medicine

## 2014-05-28 DIAGNOSIS — N183 Chronic kidney disease, stage 3 unspecified: Secondary | ICD-10-CM | POA: Diagnosis not present

## 2014-05-28 DIAGNOSIS — E1129 Type 2 diabetes mellitus with other diabetic kidney complication: Secondary | ICD-10-CM | POA: Diagnosis not present

## 2014-05-28 DIAGNOSIS — I129 Hypertensive chronic kidney disease with stage 1 through stage 4 chronic kidney disease, or unspecified chronic kidney disease: Secondary | ICD-10-CM | POA: Diagnosis not present

## 2014-05-28 DIAGNOSIS — R809 Proteinuria, unspecified: Secondary | ICD-10-CM | POA: Diagnosis not present

## 2014-07-14 DIAGNOSIS — S336XXA Sprain of sacroiliac joint, initial encounter: Secondary | ICD-10-CM | POA: Diagnosis not present

## 2014-07-14 DIAGNOSIS — M999 Biomechanical lesion, unspecified: Secondary | ICD-10-CM | POA: Diagnosis not present

## 2014-07-27 ENCOUNTER — Emergency Department (HOSPITAL_BASED_OUTPATIENT_CLINIC_OR_DEPARTMENT_OTHER): Payer: Medicare Other

## 2014-07-27 ENCOUNTER — Encounter (HOSPITAL_BASED_OUTPATIENT_CLINIC_OR_DEPARTMENT_OTHER): Payer: Self-pay | Admitting: Emergency Medicine

## 2014-07-27 ENCOUNTER — Emergency Department (HOSPITAL_BASED_OUTPATIENT_CLINIC_OR_DEPARTMENT_OTHER)
Admission: EM | Admit: 2014-07-27 | Discharge: 2014-07-27 | Disposition: A | Discharge status: Home / Self Care | Payer: Medicare Other | Attending: Emergency Medicine | Admitting: Emergency Medicine

## 2014-07-27 DIAGNOSIS — Z860100 Personal history of colon polyps, unspecified: Secondary | ICD-10-CM | POA: Insufficient documentation

## 2014-07-27 DIAGNOSIS — I2510 Atherosclerotic heart disease of native coronary artery without angina pectoris: Secondary | ICD-10-CM | POA: Insufficient documentation

## 2014-07-27 DIAGNOSIS — E119 Type 2 diabetes mellitus without complications: Secondary | ICD-10-CM | POA: Diagnosis not present

## 2014-07-27 DIAGNOSIS — Z7982 Long term (current) use of aspirin: Secondary | ICD-10-CM | POA: Diagnosis not present

## 2014-07-27 DIAGNOSIS — I10 Essential (primary) hypertension: Secondary | ICD-10-CM | POA: Insufficient documentation

## 2014-07-27 DIAGNOSIS — Z794 Long term (current) use of insulin: Secondary | ICD-10-CM | POA: Diagnosis not present

## 2014-07-27 DIAGNOSIS — E785 Hyperlipidemia, unspecified: Secondary | ICD-10-CM | POA: Insufficient documentation

## 2014-07-27 DIAGNOSIS — Z79899 Other long term (current) drug therapy: Secondary | ICD-10-CM | POA: Insufficient documentation

## 2014-07-27 DIAGNOSIS — Z87891 Personal history of nicotine dependence: Secondary | ICD-10-CM | POA: Insufficient documentation

## 2014-07-27 DIAGNOSIS — M25519 Pain in unspecified shoulder: Secondary | ICD-10-CM | POA: Diagnosis not present

## 2014-07-27 DIAGNOSIS — G4733 Obstructive sleep apnea (adult) (pediatric): Secondary | ICD-10-CM | POA: Diagnosis not present

## 2014-07-27 DIAGNOSIS — M25512 Pain in left shoulder: Secondary | ICD-10-CM

## 2014-07-27 DIAGNOSIS — M19019 Primary osteoarthritis, unspecified shoulder: Secondary | ICD-10-CM | POA: Diagnosis not present

## 2014-07-27 MED ORDER — OXYCODONE-ACETAMINOPHEN 5-325 MG PO TABS
2.0000 | ORAL_TABLET | ORAL | Status: DC | PRN
Start: 2014-07-27 — End: 2014-10-07

## 2014-07-27 MED ORDER — HYDROMORPHONE HCL PF 1 MG/ML IJ SOLN
2.0000 mg | Freq: Once | INTRAMUSCULAR | Status: AC
  Administered 2014-07-27: 2 mg via INTRAMUSCULAR
  Filled 2014-07-27: qty 2

## 2014-07-27 NOTE — Discharge Instructions (Signed)
Rest.  Where a shoulder sling as applied.  Percocet as prescribed as needed for pain.  Followup with your primary doctor or your orthopedic surgeon in the next week if not improving.   Shoulder Pain The shoulder is the joint that connects your arms to your body. The bones that form the shoulder joint include the upper arm bone (humerus), the shoulder blade (scapula), and the collarbone (clavicle). The top of the humerus is shaped like a ball and fits into a rather flat socket on the scapula (glenoid cavity). A combination of muscles and strong, fibrous tissues that connect muscles to bones (tendons) support your shoulder joint and hold the ball in the socket. Small, fluid-filled sacs (bursae) are located in different areas of the joint. They act as cushions between the bones and the overlying soft tissues and help reduce friction between the gliding tendons and the bone as you move your arm. Your shoulder joint allows a wide range of motion in your arm. This range of motion allows you to do things like scratch your back or throw a ball. However, this range of motion also makes your shoulder more prone to pain from overuse and injury. Causes of shoulder pain can originate from both injury and overuse and usually can be grouped in the following four categories:  Redness, swelling, and pain (inflammation) of the tendon (tendinitis) or the bursae (bursitis).  Instability, such as a dislocation of the joint.  Inflammation of the joint (arthritis).  Broken bone (fracture). HOME CARE INSTRUCTIONS   Apply ice to the sore area.  Put ice in a plastic bag.  Place a towel between your skin and the bag.  Leave the ice on for 15-20 minutes, 3-4 times per day for the first 2 days, or as directed by your health care provider.  Stop using cold packs if they do not help with the pain.  If you have a shoulder sling or immobilizer, wear it as long as your caregiver instructs. Only remove it to shower or  bathe. Move your arm as little as possible, but keep your hand moving to prevent swelling.  Squeeze a soft ball or foam pad as much as possible to help prevent swelling.  Only take over-the-counter or prescription medicines for pain, discomfort, or fever as directed by your caregiver. SEEK MEDICAL CARE IF:   Your shoulder pain increases, or new pain develops in your arm, hand, or fingers.  Your hand or fingers become cold and numb.  Your pain is not relieved with medicines. SEEK IMMEDIATE MEDICAL CARE IF:   Your arm, hand, or fingers are numb or tingling.  Your arm, hand, or fingers are significantly swollen or turn white or blue. MAKE SURE YOU:   Understand these instructions.  Will watch your condition.  Will get help right away if you are not doing well or get worse. Document Released: 08/16/2005 Document Revised: 03/23/2014 Document Reviewed: 10/21/2011 [facility] Patient Information 2015 [place], [place]. This information is not intended to replace advice given to you by your health care provider. Make sure you discuss any questions you have with your health care provider.

## 2014-07-27 NOTE — ED Provider Notes (Signed)
CSN: [id number]     Arrival date & time 07/27/14  [id number] History   First MD Initiated Contact with Patient 07/27/14 [phone]     Chief Complaint  Patient presents with  . Shoulder Pain     (Consider location/radiation/quality/duration/timing/severity/associated sxs/prior Treatment) HPI Comments: Patient is a 68 year old male who presents with complaints of severe left shoulder pain. It started gradually 5 days ago and worsened after he attempted to spread March. His pain is now severe and is located in the top of the shoulder. He has pain with any range of motion. He denies any numbness, tingling, or weakness.  Patient is a 68 y.o. male presenting with shoulder pain. The history is provided by the patient.  Shoulder Pain This is a new problem. Episode onset: 5 days ago. The problem occurs constantly. The problem has been rapidly worsening. Pertinent negatives include no chest pain and no shortness of breath. Exacerbated by: Movement, position, palpation. Nothing relieves the symptoms. Treatments tried: Robaxin. The treatment provided no relief.    Past Medical History  Diagnosis Date  . Diabetes mellitus   . Hypertension   . Hyperlipidemia   . OSA on CPAP   . Renal insufficiency   . CAD (coronary artery disease)   . Personal history of colonic polyps - adenomas 01/28/2014   Past Surgical History  Procedure Laterality Date  . Pilonidal cyst excision    . Carpal tunnel release     Family History  Problem Relation Age of Onset  . Diabetes Mother   . Coronary artery disease Father 38    CABG  . Diabetes Father   . Colon cancer Neg Hx    History  Substance Use Topics  . Smoking status: Former Smoker -- 1.00 packs/day for 20 years    Types: Cigarettes    Quit date: 03/06/1977  . Smokeless tobacco: Never Used  . Alcohol Use: No    Review of Systems  Respiratory: Negative for shortness of breath.   Cardiovascular: Negative for chest pain.  All other systems reviewed and are  negative.     Allergies  Review of patient's allergies indicates no known allergies.  Home Medications   Prior to Admission medications   Medication Sig Start Date End Date Taking? Authorizing Provider  metolazone (ZAROXOLYN) 2.5 MG tablet Take 2.5 mg by mouth daily.   Yes Historical Provider, MD  metoprolol (LOPRESSOR) 100 MG tablet Take 100 mg by mouth 2 (two) times daily.   Yes Historical Provider, MD  AMBULATORY NON FORMULARY MEDICATION Medication Name: diabeic shoes. Dx 250.00 10/31/11   [name], MD  AMBULATORY NON FORMULARY MEDICATION BD Ultra-Fine Pen Needles ( Nano)  9mm Gauge 32. Diagnosis: Diabetes.  Pt testing 2 times a day 12/05/12   [name], MD  aspirin 81 MG tablet Take 81 mg by mouth daily.      Historical Provider, MD  atorvastatin (LIPITOR) 80 MG tablet Take 1 tablet (80 mg total) by mouth daily. 06/18/13   Lelon Perla, MD  BENICAR 40 MG tablet TAKE 1 TABLET EVERY DAY 12/04/13   [name], MD  Cholecalciferol (VITAMIN D-3 PO) Take 1 tablet by mouth daily.    Historical Provider, MD  cloNIDine (CATAPRES) 0.3 MG tablet Take 0.3 mg by mouth 3 (three) times daily.    Historical Provider, MD  furosemide (LASIX) 80 MG tablet Take 80 mg by mouth 2 (two) times daily.    Historical Provider, MD  gabapentin (NEURONTIN) 300 MG capsule  One tab PO qHS for a week, then BID for a week, then TID. May double weekly to a max of 3,600mg /day 01/30/14   Silverio Decamp, MD  hydrALAZINE (APRESOLINE) 25 MG tablet Take 1 tablet (25 mg total) by mouth 3 (three) times daily. 06/18/13   Lelon Perla, MD  insulin detemir (LEVEMIR) 100 UNIT/ML injection Inject into the skin at bedtime.      Historical Provider, MD  LEVITRA 10 MG tablet TAKE 1 TABLET EVERY DAY AS NEEDED 09/02/11   [name], MD  meloxicam (MOBIC) 15 MG tablet Take 1 tablet (15 mg total) by mouth daily. 10/31/13   [name], MD  nitroGLYCERIN (NITROSTAT) 0.4 MG SL  tablet Place 0.4 mg under the tongue every 5 (five) minutes as needed.      Historical Provider, MD  NOVOLOG 100 UNIT/ML injection INJECT PER SSI WITH EACH MEAL. 07/28/12   [name], MD   BP 154/65  Pulse 60  Temp(Src) 97.6 F (36.4 C) (Oral)  Ht 5\' 8"  (1.727 m)  Wt 245 lb (111.131 kg)  BMI 37.26 kg/m2  SpO2 97% Physical Exam  Nursing note and vitals reviewed. Constitutional: He is oriented to person, place, and time. He appears well-developed and well-nourished. No distress.  HENT:  Head: Normocephalic and atraumatic.  Neck: Normal range of motion. Neck supple.  Musculoskeletal:  The left shoulder appears grossly normal. There is tenderness to palpation over the superior aspect in the area of the a.c. joint. There is pain with any range of motion. Distal or and radial pulses are easily palpable. Strength is 5 out of 5 in the fingers and hand. Sensation is intact throughout the hand distally.  Neurological: He is alert and oriented to person, place, and time.  Skin: Skin is warm and dry. He is not diaphoretic.    ED Course  Procedures (including critical care time) Labs Review Labs Reviewed - No data to display  Imaging Review No results found.   EKG Interpretation None      MDM   Final diagnoses:  None    Patient appears to have an a.c. joint sprain. This will be treated with pain medication rest with an arm sling, and time. He has a history of both diabetes and chronic renal insufficiency and I have elected not to treat with anti-inflammatories due to the risk of driving his sugars upward and worsening his renal function.    Veryl Speak, MD 07/27/14 [phone]

## 2014-07-27 NOTE — ED Notes (Signed)
Pt c/o left shoulder pain without injury x several days aggravated by spreading mulch on Sat. Pt took methocarbamol at 4:30 to day without relief.

## 2014-07-28 DIAGNOSIS — M259 Joint disorder, unspecified: Secondary | ICD-10-CM | POA: Diagnosis not present

## 2014-07-30 DIAGNOSIS — D631 Anemia in chronic kidney disease: Secondary | ICD-10-CM | POA: Diagnosis not present

## 2014-07-30 DIAGNOSIS — I129 Hypertensive chronic kidney disease with stage 1 through stage 4 chronic kidney disease, or unspecified chronic kidney disease: Secondary | ICD-10-CM | POA: Diagnosis not present

## 2014-07-30 DIAGNOSIS — N183 Chronic kidney disease, stage 3 unspecified: Secondary | ICD-10-CM | POA: Diagnosis not present

## 2014-07-30 DIAGNOSIS — R809 Proteinuria, unspecified: Secondary | ICD-10-CM | POA: Diagnosis not present

## 2014-07-30 DIAGNOSIS — N039 Chronic nephritic syndrome with unspecified morphologic changes: Secondary | ICD-10-CM | POA: Diagnosis not present

## 2014-08-06 DIAGNOSIS — M25519 Pain in unspecified shoulder: Secondary | ICD-10-CM | POA: Diagnosis not present

## 2014-08-07 DIAGNOSIS — I2510 Atherosclerotic heart disease of native coronary artery without angina pectoris: Secondary | ICD-10-CM | POA: Diagnosis not present

## 2014-08-07 DIAGNOSIS — I10 Essential (primary) hypertension: Secondary | ICD-10-CM | POA: Diagnosis not present

## 2014-08-07 DIAGNOSIS — E1139 Type 2 diabetes mellitus with other diabetic ophthalmic complication: Secondary | ICD-10-CM | POA: Diagnosis not present

## 2014-08-07 DIAGNOSIS — I2583 Coronary atherosclerosis due to lipid rich plaque: Secondary | ICD-10-CM | POA: Diagnosis not present

## 2014-08-07 DIAGNOSIS — E1129 Type 2 diabetes mellitus with other diabetic kidney complication: Secondary | ICD-10-CM | POA: Diagnosis not present

## 2014-08-07 DIAGNOSIS — E11319 Type 2 diabetes mellitus with unspecified diabetic retinopathy without macular edema: Secondary | ICD-10-CM | POA: Diagnosis not present

## 2014-08-07 DIAGNOSIS — E785 Hyperlipidemia, unspecified: Secondary | ICD-10-CM | POA: Diagnosis not present

## 2014-08-11 LAB — HEMOGLOBIN A1C: Hgb A1c MFr Bld: 7.9 % — AB (ref 4.0–6.0)

## 2014-08-18 DIAGNOSIS — M5126 Other intervertebral disc displacement, lumbar region: Secondary | ICD-10-CM | POA: Diagnosis not present

## 2014-08-18 DIAGNOSIS — M999 Biomechanical lesion, unspecified: Secondary | ICD-10-CM | POA: Diagnosis not present

## 2014-08-19 ENCOUNTER — Ambulatory Visit: Payer: Medicare Other

## 2014-08-20 DIAGNOSIS — M9903 Segmental and somatic dysfunction of lumbar region: Secondary | ICD-10-CM | POA: Diagnosis not present

## 2014-08-20 DIAGNOSIS — M5126 Other intervertebral disc displacement, lumbar region: Secondary | ICD-10-CM | POA: Diagnosis not present

## 2014-08-21 DIAGNOSIS — E11321 Type 2 diabetes mellitus with mild nonproliferative diabetic retinopathy with macular edema: Secondary | ICD-10-CM | POA: Diagnosis not present

## 2014-08-21 DIAGNOSIS — Z87891 Personal history of nicotine dependence: Secondary | ICD-10-CM | POA: Diagnosis not present

## 2014-08-21 DIAGNOSIS — Z9889 Other specified postprocedural states: Secondary | ICD-10-CM | POA: Diagnosis not present

## 2014-08-25 DIAGNOSIS — M5126 Other intervertebral disc displacement, lumbar region: Secondary | ICD-10-CM | POA: Diagnosis not present

## 2014-08-25 DIAGNOSIS — M9903 Segmental and somatic dysfunction of lumbar region: Secondary | ICD-10-CM | POA: Diagnosis not present

## 2014-08-27 DIAGNOSIS — M5126 Other intervertebral disc displacement, lumbar region: Secondary | ICD-10-CM | POA: Diagnosis not present

## 2014-08-27 DIAGNOSIS — M9903 Segmental and somatic dysfunction of lumbar region: Secondary | ICD-10-CM | POA: Diagnosis not present

## 2014-08-27 DIAGNOSIS — M19012 Primary osteoarthritis, left shoulder: Secondary | ICD-10-CM | POA: Diagnosis not present

## 2014-08-29 DIAGNOSIS — M9903 Segmental and somatic dysfunction of lumbar region: Secondary | ICD-10-CM | POA: Diagnosis not present

## 2014-08-29 DIAGNOSIS — M5126 Other intervertebral disc displacement, lumbar region: Secondary | ICD-10-CM | POA: Diagnosis not present

## 2014-09-01 DIAGNOSIS — M9903 Segmental and somatic dysfunction of lumbar region: Secondary | ICD-10-CM | POA: Diagnosis not present

## 2014-09-01 DIAGNOSIS — M5126 Other intervertebral disc displacement, lumbar region: Secondary | ICD-10-CM | POA: Diagnosis not present

## 2014-09-03 DIAGNOSIS — M9903 Segmental and somatic dysfunction of lumbar region: Secondary | ICD-10-CM | POA: Diagnosis not present

## 2014-09-03 DIAGNOSIS — M5126 Other intervertebral disc displacement, lumbar region: Secondary | ICD-10-CM | POA: Diagnosis not present

## 2014-09-04 ENCOUNTER — Other Ambulatory Visit: Payer: Self-pay

## 2014-09-08 DIAGNOSIS — M9903 Segmental and somatic dysfunction of lumbar region: Secondary | ICD-10-CM | POA: Diagnosis not present

## 2014-09-08 DIAGNOSIS — M5126 Other intervertebral disc displacement, lumbar region: Secondary | ICD-10-CM | POA: Diagnosis not present

## 2014-09-10 DIAGNOSIS — M5126 Other intervertebral disc displacement, lumbar region: Secondary | ICD-10-CM | POA: Diagnosis not present

## 2014-09-10 DIAGNOSIS — M9903 Segmental and somatic dysfunction of lumbar region: Secondary | ICD-10-CM | POA: Diagnosis not present

## 2014-09-15 DIAGNOSIS — M5126 Other intervertebral disc displacement, lumbar region: Secondary | ICD-10-CM | POA: Diagnosis not present

## 2014-09-15 DIAGNOSIS — M9903 Segmental and somatic dysfunction of lumbar region: Secondary | ICD-10-CM | POA: Diagnosis not present

## 2014-09-17 DIAGNOSIS — M5126 Other intervertebral disc displacement, lumbar region: Secondary | ICD-10-CM | POA: Diagnosis not present

## 2014-09-17 DIAGNOSIS — M9903 Segmental and somatic dysfunction of lumbar region: Secondary | ICD-10-CM | POA: Diagnosis not present

## 2014-09-22 DIAGNOSIS — M5126 Other intervertebral disc displacement, lumbar region: Secondary | ICD-10-CM | POA: Diagnosis not present

## 2014-09-22 DIAGNOSIS — M9903 Segmental and somatic dysfunction of lumbar region: Secondary | ICD-10-CM | POA: Diagnosis not present

## 2014-09-25 DIAGNOSIS — E11329 Type 2 diabetes mellitus with mild nonproliferative diabetic retinopathy without macular edema: Secondary | ICD-10-CM | POA: Diagnosis not present

## 2014-09-25 DIAGNOSIS — E11321 Type 2 diabetes mellitus with mild nonproliferative diabetic retinopathy with macular edema: Secondary | ICD-10-CM | POA: Diagnosis not present

## 2014-09-25 DIAGNOSIS — Z87891 Personal history of nicotine dependence: Secondary | ICD-10-CM | POA: Diagnosis not present

## 2014-09-26 DIAGNOSIS — M9903 Segmental and somatic dysfunction of lumbar region: Secondary | ICD-10-CM | POA: Diagnosis not present

## 2014-09-26 DIAGNOSIS — M5126 Other intervertebral disc displacement, lumbar region: Secondary | ICD-10-CM | POA: Diagnosis not present

## 2014-09-29 DIAGNOSIS — M5126 Other intervertebral disc displacement, lumbar region: Secondary | ICD-10-CM | POA: Diagnosis not present

## 2014-09-29 DIAGNOSIS — M9903 Segmental and somatic dysfunction of lumbar region: Secondary | ICD-10-CM | POA: Diagnosis not present

## 2014-10-03 DIAGNOSIS — M9903 Segmental and somatic dysfunction of lumbar region: Secondary | ICD-10-CM | POA: Diagnosis not present

## 2014-10-03 DIAGNOSIS — M5126 Other intervertebral disc displacement, lumbar region: Secondary | ICD-10-CM | POA: Diagnosis not present

## 2014-10-05 ENCOUNTER — Inpatient Hospital Stay (HOSPITAL_COMMUNITY)
Admission: EM | Admit: 2014-10-05 | Discharge: 2014-10-07 | DRG: 292 | Disposition: A | Discharge status: Home / Self Care | Payer: Medicare Other | Attending: Cardiology | Admitting: Cardiology

## 2014-10-05 ENCOUNTER — Emergency Department (HOSPITAL_COMMUNITY): Payer: Medicare Other

## 2014-10-05 ENCOUNTER — Encounter (HOSPITAL_COMMUNITY): Payer: Self-pay | Admitting: *Deleted

## 2014-10-05 DIAGNOSIS — Z794 Long term (current) use of insulin: Secondary | ICD-10-CM

## 2014-10-05 DIAGNOSIS — Z8249 Family history of ischemic heart disease and other diseases of the circulatory system: Secondary | ICD-10-CM | POA: Diagnosis not present

## 2014-10-05 DIAGNOSIS — D631 Anemia in chronic kidney disease: Secondary | ICD-10-CM | POA: Diagnosis present

## 2014-10-05 DIAGNOSIS — Z7982 Long term (current) use of aspirin: Secondary | ICD-10-CM

## 2014-10-05 DIAGNOSIS — R52 Pain, unspecified: Secondary | ICD-10-CM

## 2014-10-05 DIAGNOSIS — I129 Hypertensive chronic kidney disease with stage 1 through stage 4 chronic kidney disease, or unspecified chronic kidney disease: Secondary | ICD-10-CM | POA: Diagnosis present

## 2014-10-05 DIAGNOSIS — M25619 Stiffness of unspecified shoulder, not elsewhere classified: Secondary | ICD-10-CM | POA: Diagnosis not present

## 2014-10-05 DIAGNOSIS — R293 Abnormal posture: Secondary | ICD-10-CM | POA: Diagnosis not present

## 2014-10-05 DIAGNOSIS — M6281 Muscle weakness (generalized): Secondary | ICD-10-CM | POA: Diagnosis not present

## 2014-10-05 DIAGNOSIS — R001 Bradycardia, unspecified: Secondary | ICD-10-CM | POA: Diagnosis not present

## 2014-10-05 DIAGNOSIS — E11319 Type 2 diabetes mellitus with unspecified diabetic retinopathy without macular edema: Secondary | ICD-10-CM | POA: Diagnosis present

## 2014-10-05 DIAGNOSIS — N184 Chronic kidney disease, stage 4 (severe): Secondary | ICD-10-CM

## 2014-10-05 DIAGNOSIS — E669 Obesity, unspecified: Secondary | ICD-10-CM | POA: Diagnosis present

## 2014-10-05 DIAGNOSIS — E1121 Type 2 diabetes mellitus with diabetic nephropathy: Secondary | ICD-10-CM | POA: Diagnosis present

## 2014-10-05 DIAGNOSIS — I200 Unstable angina: Secondary | ICD-10-CM

## 2014-10-05 DIAGNOSIS — E1322 Other specified diabetes mellitus with diabetic chronic kidney disease: Secondary | ICD-10-CM

## 2014-10-05 DIAGNOSIS — N183 Chronic kidney disease, stage 3 (moderate): Secondary | ICD-10-CM | POA: Diagnosis present

## 2014-10-05 DIAGNOSIS — E1122 Type 2 diabetes mellitus with diabetic chronic kidney disease: Secondary | ICD-10-CM | POA: Diagnosis present

## 2014-10-05 DIAGNOSIS — Z87891 Personal history of nicotine dependence: Secondary | ICD-10-CM | POA: Diagnosis not present

## 2014-10-05 DIAGNOSIS — I5031 Acute diastolic (congestive) heart failure: Secondary | ICD-10-CM | POA: Diagnosis not present

## 2014-10-05 DIAGNOSIS — I059 Rheumatic mitral valve disease, unspecified: Secondary | ICD-10-CM | POA: Diagnosis not present

## 2014-10-05 DIAGNOSIS — I5033 Acute on chronic diastolic (congestive) heart failure: Secondary | ICD-10-CM | POA: Diagnosis not present

## 2014-10-05 DIAGNOSIS — I25119 Atherosclerotic heart disease of native coronary artery with unspecified angina pectoris: Secondary | ICD-10-CM | POA: Diagnosis present

## 2014-10-05 DIAGNOSIS — I509 Heart failure, unspecified: Secondary | ICD-10-CM

## 2014-10-05 DIAGNOSIS — Z9114 Patient's other noncompliance with medication regimen: Secondary | ICD-10-CM | POA: Diagnosis present

## 2014-10-05 DIAGNOSIS — G4733 Obstructive sleep apnea (adult) (pediatric): Secondary | ICD-10-CM

## 2014-10-05 DIAGNOSIS — R0789 Other chest pain: Secondary | ICD-10-CM | POA: Diagnosis not present

## 2014-10-05 DIAGNOSIS — E785 Hyperlipidemia, unspecified: Secondary | ICD-10-CM | POA: Diagnosis present

## 2014-10-05 DIAGNOSIS — M25512 Pain in left shoulder: Secondary | ICD-10-CM | POA: Diagnosis present

## 2014-10-05 DIAGNOSIS — R0602 Shortness of breath: Secondary | ICD-10-CM | POA: Diagnosis not present

## 2014-10-05 DIAGNOSIS — Z833 Family history of diabetes mellitus: Secondary | ICD-10-CM | POA: Diagnosis not present

## 2014-10-05 DIAGNOSIS — D369 Benign neoplasm, unspecified site: Secondary | ICD-10-CM | POA: Diagnosis present

## 2014-10-05 DIAGNOSIS — Z6838 Body mass index (BMI) 38.0-38.9, adult: Secondary | ICD-10-CM | POA: Diagnosis not present

## 2014-10-05 DIAGNOSIS — I25110 Atherosclerotic heart disease of native coronary artery with unstable angina pectoris: Secondary | ICD-10-CM | POA: Diagnosis not present

## 2014-10-05 DIAGNOSIS — N185 Chronic kidney disease, stage 5: Secondary | ICD-10-CM | POA: Diagnosis present

## 2014-10-05 DIAGNOSIS — R809 Proteinuria, unspecified: Secondary | ICD-10-CM | POA: Diagnosis present

## 2014-10-05 DIAGNOSIS — I2510 Atherosclerotic heart disease of native coronary artery without angina pectoris: Secondary | ICD-10-CM | POA: Diagnosis present

## 2014-10-05 DIAGNOSIS — I120 Hypertensive chronic kidney disease with stage 5 chronic kidney disease or end stage renal disease: Secondary | ICD-10-CM | POA: Diagnosis not present

## 2014-10-05 DIAGNOSIS — R079 Chest pain, unspecified: Secondary | ICD-10-CM | POA: Diagnosis not present

## 2014-10-05 HISTORY — DX: Reserved for inherently not codable concepts without codable children: IMO0001

## 2014-10-05 HISTORY — DX: Unspecified diastolic (congestive) heart failure: I50.30

## 2014-10-05 LAB — CBC WITH DIFFERENTIAL/PLATELET
BASOS ABS: 0 10*3/uL (ref 0.0–0.1)
Basophils Relative: 0 % (ref 0–1)
EOS PCT: 3 % (ref 0–5)
Eosinophils Absolute: 0.2 10*3/uL (ref 0.0–0.7)
HEMATOCRIT: 36 % — AB (ref 39.0–52.0)
Hemoglobin: 11.4 g/dL — ABNORMAL LOW (ref 13.0–17.0)
LYMPHS ABS: 1 10*3/uL (ref 0.7–4.0)
LYMPHS PCT: 14 % (ref 12–46)
MCH: 28 pg (ref 26.0–34.0)
MCHC: 31.7 g/dL (ref 30.0–36.0)
MCV: 88.5 fL (ref 78.0–100.0)
Monocytes Absolute: 0.6 10*3/uL (ref 0.1–1.0)
Monocytes Relative: 8 % (ref 3–12)
NEUTROS ABS: 5.4 10*3/uL (ref 1.7–7.7)
Neutrophils Relative %: 75 % (ref 43–77)
Platelets: 210 10*3/uL (ref 150–400)
RBC: 4.07 MIL/uL — AB (ref 4.22–5.81)
RDW: 15.2 % (ref 11.5–15.5)
WBC: 7.1 10*3/uL (ref 4.0–10.5)

## 2014-10-05 LAB — GLUCOSE, CAPILLARY
GLUCOSE-CAPILLARY: 120 mg/dL — AB (ref 70–99)
Glucose-Capillary: 172 mg/dL — ABNORMAL HIGH (ref 70–99)
Glucose-Capillary: 247 mg/dL — ABNORMAL HIGH (ref 70–99)

## 2014-10-05 LAB — TROPONIN I
Troponin I: <0.3 ng/mL (ref <0.30)
Troponin I: <0.3 ng/mL (ref <0.30)
Troponin I: <0.3 ng/mL (ref <0.30)

## 2014-10-05 LAB — I-STAT CHEM 8, ED
BUN: 45 mg/dL — AB (ref 6–23)
CHLORIDE: 102 meq/L (ref 96–112)
CREATININE: 2.4 mg/dL — AB (ref 0.50–1.35)
Calcium, Ion: 1.12 mmol/L — ABNORMAL LOW (ref 1.13–1.30)
Glucose, Bld: 139 mg/dL — ABNORMAL HIGH (ref 70–99)
HCT: 40 % (ref 39.0–52.0)
Hemoglobin: 13.6 g/dL (ref 13.0–17.0)
Potassium: 4.1 mEq/L (ref 3.7–5.3)
Sodium: 137 mEq/L (ref 137–147)
TCO2: 24 mmol/L (ref 0–100)

## 2014-10-05 LAB — PROTEIN / CREATININE RATIO, URINE
Creatinine, Urine: 19.46 mg/dL
Protein Creatinine Ratio: 1.16 — ABNORMAL HIGH (ref 0.00–0.15)
TOTAL PROTEIN, URINE: 22.6 mg/dL

## 2014-10-05 LAB — I-STAT TROPONIN, ED: TROPONIN I, POC: 0 ng/mL (ref 0.00–0.08)

## 2014-10-05 LAB — HEMOGLOBIN A1C
Hgb A1c MFr Bld: 7.1 % — ABNORMAL HIGH (ref <5.7)
Mean Plasma Glucose: 157 mg/dL — ABNORMAL HIGH (ref <117)

## 2014-10-05 LAB — PRO B NATRIURETIC PEPTIDE: Pro B Natriuretic peptide (BNP): 1703 pg/mL — ABNORMAL HIGH (ref 0–125)

## 2014-10-05 MED ORDER — FUROSEMIDE 10 MG/ML IJ SOLN
80.0000 mg | Freq: Two times a day (BID) | INTRAMUSCULAR | Status: DC
  Administered 2014-10-05 – 2014-10-07 (×5): 80 mg via INTRAVENOUS
  Filled 2014-10-05 (×8): qty 8

## 2014-10-05 MED ORDER — ATORVASTATIN CALCIUM 80 MG PO TABS
80.0000 mg | ORAL_TABLET | Freq: Every day | ORAL | Status: DC
  Administered 2014-10-06: 80 mg via ORAL
  Filled 2014-10-05 (×2): qty 1

## 2014-10-05 MED ORDER — IRBESARTAN 300 MG PO TABS
300.0000 mg | ORAL_TABLET | Freq: Every day | ORAL | Status: DC
  Administered 2014-10-06 – 2014-10-07 (×2): 300 mg via ORAL
  Filled 2014-10-05 (×2): qty 1

## 2014-10-05 MED ORDER — ASPIRIN 81 MG PO CHEW
324.0000 mg | CHEWABLE_TABLET | Freq: Once | ORAL | Status: AC
  Administered 2014-10-05: 324 mg via ORAL
  Filled 2014-10-05: qty 4

## 2014-10-05 MED ORDER — HEPARIN SODIUM (PORCINE) 5000 UNIT/ML IJ SOLN
5000.0000 [IU] | Freq: Three times a day (TID) | INTRAMUSCULAR | Status: DC
Start: 2014-10-05 — End: 2014-10-07
  Administered 2014-10-05 – 2014-10-07 (×6): 5000 [IU] via SUBCUTANEOUS
  Filled 2014-10-05 (×9): qty 1

## 2014-10-05 MED ORDER — CLONIDINE HCL 0.2 MG PO TABS
0.2000 mg | ORAL_TABLET | Freq: Three times a day (TID) | ORAL | Status: DC
  Administered 2014-10-05 – 2014-10-07 (×6): 0.2 mg via ORAL
  Filled 2014-10-05 (×8): qty 1

## 2014-10-05 MED ORDER — INSULIN DETEMIR 100 UNIT/ML ~~LOC~~ SOLN
120.0000 [IU] | Freq: Every day | SUBCUTANEOUS | Status: DC
  Administered 2014-10-05 – 2014-10-06 (×2): 120 [IU] via SUBCUTANEOUS
  Filled 2014-10-05 (×3): qty 1.2

## 2014-10-05 MED ORDER — ASPIRIN 81 MG PO TABS: 81.0000 mg | ORAL_TABLET | Freq: Every day | ORAL | Status: DC

## 2014-10-05 MED ORDER — INSULIN ASPART 100 UNIT/ML ~~LOC~~ SOLN
0.0000 [IU] | Freq: Three times a day (TID) | SUBCUTANEOUS | Status: DC
  Administered 2014-10-05 – 2014-10-06 (×2): 4 [IU] via SUBCUTANEOUS
  Administered 2014-10-06: 7 [IU] via SUBCUTANEOUS
  Administered 2014-10-06: 4 [IU] via SUBCUTANEOUS
  Administered 2014-10-07: 3 [IU] via SUBCUTANEOUS
  Administered 2014-10-07: 7 [IU] via SUBCUTANEOUS

## 2014-10-05 MED ORDER — ASPIRIN EC 81 MG PO TBEC
81.0000 mg | DELAYED_RELEASE_TABLET | Freq: Every day | ORAL | Status: DC
  Administered 2014-10-06 – 2014-10-07 (×2): 81 mg via ORAL
  Filled 2014-10-05 (×2): qty 1

## 2014-10-05 MED ORDER — PERFLUTREN LIPID MICROSPHERE
1.0000 mL | INTRAVENOUS | Status: AC | PRN
  Administered 2014-10-05: 2 mL via INTRAVENOUS
  Filled 2014-10-05: qty 10

## 2014-10-05 MED ORDER — METOLAZONE 2.5 MG PO TABS
2.5000 mg | ORAL_TABLET | Freq: Every day | ORAL | Status: DC
  Administered 2014-10-06 – 2014-10-07 (×2): 2.5 mg via ORAL
  Filled 2014-10-05 (×2): qty 1

## 2014-10-05 MED ORDER — ISOSORBIDE DINITRATE 20 MG PO TABS
20.0000 mg | ORAL_TABLET | Freq: Three times a day (TID) | ORAL | Status: DC
  Administered 2014-10-05 – 2014-10-07 (×7): 20 mg via ORAL
  Filled 2014-10-05 (×9): qty 1

## 2014-10-05 MED ORDER — METOPROLOL TARTRATE 100 MG PO TABS
100.0000 mg | ORAL_TABLET | Freq: Two times a day (BID) | ORAL | Status: DC
  Administered 2014-10-05: 100 mg via ORAL
  Filled 2014-10-05: qty 1

## 2014-10-05 MED ORDER — HYDRALAZINE HCL 25 MG PO TABS
25.0000 mg | ORAL_TABLET | Freq: Three times a day (TID) | ORAL | Status: DC
  Administered 2014-10-05 – 2014-10-07 (×6): 25 mg via ORAL
  Filled 2014-10-05 (×8): qty 1

## 2014-10-05 MED ORDER — ACETAMINOPHEN 325 MG PO TABS
650.0000 mg | ORAL_TABLET | ORAL | Status: DC | PRN
  Administered 2014-10-05: 650 mg via ORAL
  Filled 2014-10-05: qty 2

## 2014-10-05 MED ORDER — NITROGLYCERIN 0.4 MG SL SUBL: 0.4000 mg | SUBLINGUAL_TABLET | SUBLINGUAL | Status: DC | PRN

## 2014-10-05 MED ORDER — METOPROLOL TARTRATE 100 MG PO TABS
100.0000 mg | ORAL_TABLET | Freq: Two times a day (BID) | ORAL | Status: DC
  Administered 2014-10-06 (×2): 100 mg via ORAL
  Filled 2014-10-05 (×4): qty 1

## 2014-10-05 NOTE — Consult Note (Signed)
Reason for Consult: Chronic kidney disease stage III-anticipated contrast exposure Referring Physician: Sanda Klein MD (Cardiology)  HPI:  68 year old Caucasian man with past medical history significant for diabetes and hypertension with consequent chronic kidney disease stage III-IV (baseline creatinine between 2.0-2.3). Presents with a 4 month history of gradually worsening shortness of breath and a 1 month history of progressively worsening pedal edema. Overnight, reports some indigestion and some substernal chest tightness earlier this morning that prompted him to present to the emergency room. He has been followed closely to try and optimize his diuretic therapy and reports good compliance with metolazone and furosemide. Denies any recent dietary sodium indiscretion. 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  Given his worsening exertional dyspnea/edema and prior history-there is need for coronary angiography for further management in the near future. Currently admitted with congestive heart failure and undergoing diuresis/optimization of hemodynamics status prior to contrast exposure. Previously on Benicar-switched to Avapro in the hospital and will be discontinued when definitive plans for coronary angiogram in place.  Past Medical History  Diagnosis Date  . Diabetes mellitus   . Hypertension   . Hyperlipidemia   . OSA on CPAP   . Renal insufficiency   . CAD (coronary artery disease)   . Personal history of colonic polyps - adenomas 01/28/2014  . Heart failure, diastolic   . Shortness of breath dyspnea     Past Surgical History  Procedure Laterality Date  . Pilonidal cyst excision    . Carpal tunnel release      Family History  Problem Relation Age of Onset  . Diabetes Mother   . Coronary artery disease Father 32    CABG  . Diabetes Father   . Colon cancer Neg  Hx     Social History:  reports that he quit smoking about 37 years ago. His smoking use included Cigarettes. He has a 20 pack-year smoking history. He has never used smokeless tobacco. He reports that he does not drink alcohol or use illicit drugs.  Allergies: No Known Allergies  Medications:  Scheduled: . [START ON 10/06/2014] aspirin EC  81 mg Oral Daily  . [START ON 10/06/2014] atorvastatin  80 mg Oral q1800  . cloNIDine  0.2 mg Oral TID  . furosemide  80 mg Intravenous Q12H  . heparin subcutaneous  5,000 Units Subcutaneous 3 times per day  . hydrALAZINE  25 mg Oral TID  . insulin aspart  0-20 Units Subcutaneous TID WC  . insulin detemir  120 Units Subcutaneous QHS  . [START ON 10/06/2014] irbesartan  300 mg Oral Daily  . isosorbide dinitrate  20 mg Oral TID  . [START ON 10/06/2014] metolazone  2.5 mg Oral Daily  . metoprolol  100 mg Oral BID    Results for orders placed or performed during the hospital encounter of 10/05/14 (from the past 48 hour(s))  CBC with Differential     Status: Abnormal   Collection Time: 10/05/14  6:50 AM  Result Value Ref Range   WBC 7.1 4.0 - 10.5 K/uL   RBC 4.07 (L) 4.22 - 5.81 MIL/uL   Hemoglobin 11.4 (L) 13.0 - 17.0 g/dL   HCT 36.0 (L) 39.0 - 52.0 %   MCV 88.5 78.0 - 100.0 fL   MCH 28.0 26.0 - 34.0 pg   MCHC 31.7 30.0 - 36.0 g/dL   RDW 15.2 11.5 - 15.5 %   Platelets 210  150 - 400 K/uL   Neutrophils Relative % 75 43 - 77 %   Neutro Abs 5.4 1.7 - 7.7 K/uL   Lymphocytes Relative 14 12 - 46 %   Lymphs Abs 1.0 0.7 - 4.0 K/uL   Monocytes Relative 8 3 - 12 %   Monocytes Absolute 0.6 0.1 - 1.0 K/uL   Eosinophils Relative 3 0 - 5 %   Eosinophils Absolute 0.2 0.0 - 0.7 K/uL   Basophils Relative 0 0 - 1 %   Basophils Absolute 0.0 0.0 - 0.1 K/uL  Pro b natriuretic peptide     Status: Abnormal   Collection Time: 10/05/14  7:30 AM  Result Value Ref Range   Pro B Natriuretic peptide (BNP) 1703.0 (H) 0 - 125 pg/mL  Troponin I     Status: None    Collection Time: 10/05/14  7:30 AM  Result Value Ref Range   Troponin I <0.30 <0.30 ng/mL    Comment:        Due to the release kinetics of cTnI, a negative result within the first hours of the onset of symptoms does not rule out myocardial infarction with certainty. If myocardial infarction is still suspected, repeat the test at appropriate intervals.   I-stat chem 8, ed     Status: Abnormal   Collection Time: 10/05/14  8:10 AM  Result Value Ref Range   Sodium 137 137 - 147 mEq/L   Potassium 4.1 3.7 - 5.3 mEq/L   Chloride 102 96 - 112 mEq/L   BUN 45 (H) 6 - 23 mg/dL   Creatinine, Ser 2.40 (H) 0.50 - 1.35 mg/dL   Glucose, Bld 139 (H) 70 - 99 mg/dL   Calcium, Ion 1.12 (L) 1.13 - 1.30 mmol/L   TCO2 24 0 - 100 mmol/L   Hemoglobin 13.6 13.0 - 17.0 g/dL   HCT 40.0 39.0 - 52.0 %  I-stat troponin, ED     Status: None   Collection Time: 10/05/14  8:20 AM  Result Value Ref Range   Troponin i, poc 0.00 0.00 - 0.08 ng/mL   Comment 3            Comment: Due to the release kinetics of cTnI, a negative result within the first hours of the onset of symptoms does not rule out myocardial infarction with certainty. If myocardial infarction is still suspected, repeat the test at appropriate intervals.   Glucose, capillary     Status: Abnormal   Collection Time: 10/05/14 11:10 AM  Result Value Ref Range   Glucose-Capillary 120 (H) 70 - 99 mg/dL   Comment 1 Notify RN   Troponin I     Status: None   Collection Time: 10/05/14 11:32 AM  Result Value Ref Range   Troponin I <0.30 <0.30 ng/mL    Comment:        Due to the release kinetics of cTnI, a negative result within the first hours of the onset of symptoms does not rule out myocardial infarction with certainty. If myocardial infarction is still suspected, repeat the test at appropriate intervals.     Dg Chest 2 View  10/05/2014   CLINICAL DATA:  Chest pain and short breath  EXAM: CHEST  2 VIEW  COMPARISON:  None.  FINDINGS:  Cardiac silhouette is enlarged. The aorta is ectatic. Mild bronchitic change centrally. No effusion, infiltrate, or pneumothorax. Degenerative osteophytosis of the thoracic spine.  IMPRESSION: Chronic bronchitic markings.  No acute findings.   Electronically Signed   By:  Suzy Bouchard M.D.   On: 10/05/2014 07:25    Review of Systems  Constitutional: Negative for fever, chills, weight loss and diaphoresis.  HENT: Negative.   Eyes: Negative.   Respiratory: Positive for shortness of breath. Negative for cough, sputum production and wheezing.   Cardiovascular: Positive for chest pain, orthopnea and leg swelling. Negative for palpitations, claudication and PND.  Gastrointestinal: Positive for heartburn. Negative for nausea, vomiting, abdominal pain and diarrhea.  Genitourinary: Negative.   Musculoskeletal: Negative.   Skin: Negative.   Neurological: Negative.   Endo/Heme/Allergies: Negative.   Psychiatric/Behavioral: Negative.    Blood pressure 157/70, pulse 51, temperature 97.2 F (36.2 C), temperature source Oral, resp. rate 18, height 5\' 8"  (1.727 m), weight 116.348 kg (256 lb 8 oz), SpO2 98 %. Physical Exam  Nursing note and vitals reviewed. Constitutional: He is oriented to person, place, and time. He appears well-developed. No distress.  Obese middle aged 54 man  HENT:  Head: Normocephalic and atraumatic.  Nose: Nose normal.  Mouth/Throat: No oropharyngeal exudate.  Eyes: Conjunctivae are normal. Pupils are equal, round, and reactive to light. No scleral icterus.  Neck: Normal range of motion. Neck supple. JVD present. No tracheal deviation present.  7-8cm JVD  Cardiovascular: Regular rhythm and normal heart sounds.  Exam reveals no friction rub.   No murmur heard. Respiratory: Effort normal. No respiratory distress. He has no wheezes. He has rales.  Fine bibasal rales  GI: Soft. Bowel sounds are normal. He exhibits distension. There is no tenderness. There is no rebound  and no guarding.  Musculoskeletal: Normal range of motion. He exhibits edema.  3+ LE edema- pitting  Neurological: He is alert and oriented to person, place, and time. No cranial nerve deficit. Coordination normal.  Skin: Skin is warm and dry. No rash noted. No erythema.  Psychiatric: He has a normal mood and affect.    Assessment/Plan: 1. Chronic kidney disease stage III-IV with anticipated contrast exposure (coronary angiogram). Unfortunately, high-risk for acute renal insufficiency (contrast-induced nephropathy) with underlying diabetes, chronic kidney disease and now congestive heart failure. I discussed at length with the patient the possible risk for acute renal insufficiency following contrast exposure that also may entail dialysis if needed for management. I explained to him that optimization of his hemodynamic status and minimization of contrast load will be key in reducing his chances for contrast nephropathy. Using the Mehran prediction model (JACC 2004) he has a 26% chance of contrast nephropathy and a 2-5% risk for dialysis. I agree with holding his ARB when definite plans in place for coronary angiogram. 2. Exacerbation of congestive heart failure: Switch from oral diuretic therapy currently to intravenous diuretic therapy to try and improve volume unloading. Continue to monitor closely for electrolyte wasting/renal dysfunction. 3. Anemia of chronic kidney disease: Hemoglobin greater than 11, no indications for ESA therapy at this time. 4. Hypertension: Blood pressure is fairly controlled at this time, continue to monitor with optimization of diuretic therapy anticipating further improvement as he becomes closer to euvolemic. 5. Unstable angina: Ongoing serial cardiac enzymes and started on long-acting nitrates.  Wafa Martes K. 10/05/2014, 1:39 PM

## 2014-10-05 NOTE — Progress Notes (Signed)
Patient states his wife is bringing his home CPAP unit for him to use QHS while here. Patient also says he does not need any help setting the machine up and he can go on and off the machine as needed. BI[employer] will inspect. Patient is aware to call at any time if he does need any assistance with the machine. RT will continue to assist as needed.

## 2014-10-05 NOTE — Progress Notes (Signed)
UR completed Gayle Martinez K. Deandre Stansel, RN, BSN, MSHL, CCM  10/05/2014 1:28 PM

## 2014-10-05 NOTE — ED Notes (Signed)
Patient presents via EMS.  Stated about 420 he started with chest discomfort.  He got up to do his morning routine and became SOB.  States thi SOB has been getting worse over the past 3 months.  His MD increased his Lasix to 4 times a day.  Has been told he has 100% and 75% blockage in his heart but they told him there were small capillaries that were providing blood to his heart.

## 2014-10-05 NOTE — H&P (Signed)
Cardiologist: Stanford Breed  Chief Complaint:  Chest pain and severe exertional dyspnea  HPI:  This is a 68 y.o. 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  Presents with several months of worsening exertional dyspnea, now class IIIb, progressive edema and (unquantified) weight gain with prolonged "indigestion" all night long, improved after SL NTG this AM. ECG is chronically abnormal (deep inverted T waves laterally), but without new changes and POC troponin was undetectable. ProBNP markedly elevated, no baseline available. XCreatinine 2.4, which he believes is concordant with last labs at Dr. Etheleen Nicks office. Now asymptomatic at rest as long as sitting up.   PMHx:  Past Medical History  Diagnosis Date  . Diabetes mellitus   . Hypertension   . Hyperlipidemia   . OSA on CPAP   . Renal insufficiency   . CAD (coronary artery disease)   . Personal history of colonic polyps - adenomas 01/28/2014    Past Surgical History  Procedure Laterality Date  . Pilonidal cyst excision    . Carpal tunnel release      FAMHx:  Family History  Problem Relation Age of Onset  . Diabetes Mother   . Coronary artery disease Father 72    CABG  . Diabetes Father   . Colon cancer Neg Hx     SOCHx:   reports that he quit smoking about 37 years ago. His smoking use included Cigarettes. He has a 20 pack-year smoking history. He has never used smokeless tobacco. He reports that he does not drink alcohol or use illicit drugs.  ALLERGIES:  No Known Allergies  ROS: Pertinent items are noted in HPI.  The patient specifically denies paroxysmal nocturnal dyspnea, syncope, palpitations, focal neurological  deficits, intermittent claudication, cough, hemoptysis or wheezing.  The patient also denies abdominal pain, nausea, vomiting, dysphagia, diarrhea, constipation, polyuria, polydipsia, dysuria, hematuria, frequency, urgency, abnormal bleeding or bruising, fever, chills, unexpected weight changes, mood swings, change in skin or hair texture, change in voice quality, auditory or visual problems, allergic reactions or rashes, new musculoskeletal complaints other than usual "aches and pains".   HOME MEDS:  No current facility-administered medications on file prior to encounter.   Current Outpatient Prescriptions on File Prior to Encounter  Medication Sig Dispense Refill  . AMBULATORY NON FORMULARY MEDICATION Medication Name: diabeic shoes. Dx 250.00 1 Units 0  . AMBULATORY NON FORMULARY MEDICATION BD Ultra-Fine Pen Needles ( Nano)  60mm Gauge 32. Diagnosis: Diabetes.  Pt testing 2 times a day 100 each 5  . aspirin 81 MG tablet Take 81 mg by mouth daily.      Marland Kitchen atorvastatin (LIPITOR) 80 MG tablet Take 1 tablet (80 mg total) by mouth daily. 30 tablet 12  . BENICAR 40 MG tablet TAKE 1 TABLET EVERY DAY 30 tablet 5  . Cholecalciferol (VITAMIN D-3 PO) Take 1 tablet by mouth daily.    . cloNIDine (CATAPRES) 0.3 MG tablet Take 0.3 mg by mouth 3 (three) times daily.    . furosemide (LASIX) 80 MG tablet Take 80 mg by mouth 2 (two) times daily.    . hydrALAZINE (APRESOLINE) 25 MG tablet Take 1 tablet (25 mg total) by mouth 3 (three) times daily. 270 tablet 4  . insulin detemir (LEVEMIR) 100  UNIT/ML injection Inject 120 Units into the skin at bedtime.     . meloxicam (MOBIC) 15 MG tablet Take 1 tablet (15 mg total) by mouth daily. 30 tablet 1  . metolazone (ZAROXOLYN) 2.5 MG tablet Take 2.5 mg by mouth daily.    . metoprolol (LOPRESSOR) 100 MG tablet Take 100 mg by mouth 2 (two) times daily.    . nitroGLYCERIN (NITROSTAT) 0.4 MG SL tablet Place 0.4 mg under the tongue every 5 (five) minutes as needed for chest  pain.       LABS/IMAGING: Results for orders placed or performed during the hospital encounter of 10/05/14 (from the past 48 hour(s))  CBC with Differential     Status: Abnormal   Collection Time: 10/05/14  6:50 AM  Result Value Ref Range   WBC 7.1 4.0 - 10.5 K/uL   RBC 4.07 (L) 4.22 - 5.81 MIL/uL   Hemoglobin 11.4 (L) 13.0 - 17.0 g/dL   HCT 36.0 (L) 39.0 - 52.0 %   MCV 88.5 78.0 - 100.0 fL   MCH 28.0 26.0 - 34.0 pg   MCHC 31.7 30.0 - 36.0 g/dL   RDW 15.2 11.5 - 15.5 %   Platelets 210 150 - 400 K/uL   Neutrophils Relative % 75 43 - 77 %   Neutro Abs 5.4 1.7 - 7.7 K/uL   Lymphocytes Relative 14 12 - 46 %   Lymphs Abs 1.0 0.7 - 4.0 K/uL   Monocytes Relative 8 3 - 12 %   Monocytes Absolute 0.6 0.1 - 1.0 K/uL   Eosinophils Relative 3 0 - 5 %   Eosinophils Absolute 0.2 0.0 - 0.7 K/uL   Basophils Relative 0 0 - 1 %   Basophils Absolute 0.0 0.0 - 0.1 K/uL  Pro b natriuretic peptide     Status: Abnormal   Collection Time: 10/05/14  7:30 AM  Result Value Ref Range   Pro B Natriuretic peptide (BNP) 1703.0 (H) 0 - 125 pg/mL  Troponin I     Status: None   Collection Time: 10/05/14  7:30 AM  Result Value Ref Range   Troponin I <0.30 <0.30 ng/mL    Comment:        Due to the release kinetics of cTnI, a negative result within the first hours of the onset of symptoms does not rule out myocardial infarction with certainty. If myocardial infarction is still suspected, repeat the test at appropriate intervals.   I-stat chem 8, ed     Status: Abnormal   Collection Time: 10/05/14  8:10 AM  Result Value Ref Range   Sodium 137 137 - 147 mEq/L   Potassium 4.1 3.7 - 5.3 mEq/L   Chloride 102 96 - 112 mEq/L   BUN 45 (H) 6 - 23 mg/dL   Creatinine, Ser 2.40 (H) 0.50 - 1.35 mg/dL   Glucose, Bld 139 (H) 70 - 99 mg/dL   Calcium, Ion 1.12 (L) 1.13 - 1.30 mmol/L   TCO2 24 0 - 100 mmol/L   Hemoglobin 13.6 13.0 - 17.0 g/dL   HCT 40.0 39.0 - 52.0 %  I-stat troponin, ED     Status: None    Collection Time: 10/05/14  8:20 AM  Result Value Ref Range   Troponin i, poc 0.00 0.00 - 0.08 ng/mL   Comment 3            Comment: Due to the release kinetics of cTnI, a negative result within the first hours of the onset of symptoms does  not rule out myocardial infarction with certainty. If myocardial infarction is still suspected, repeat the test at appropriate intervals.    Dg Chest 2 View  10/05/2014   CLINICAL DATA:  Chest pain and short breath  EXAM: CHEST  2 VIEW  COMPARISON:  None.  FINDINGS: Cardiac silhouette is enlarged. The aorta is ectatic. Mild bronchitic change centrally. No effusion, infiltrate, or pneumothorax. Degenerative osteophytosis of the thoracic spine.  IMPRESSION: Chronic bronchitic markings.  No acute findings.   Electronically Signed   By: Suzy Bouchard M.D.   On: 10/05/2014 07:25    VITALS: Blood pressure 130/61, pulse 49, temperature 97.9 F (36.6 C), temperature source Oral, resp. rate 10, height 5\' 8"  (1.727 m), weight 260 lb (117.935 kg), SpO2 98 %.  EXAM:  General: Alert, oriented x3, no distress. Severe obesity limits the exam Head: no evidence of trauma, PERRL, EOMI, no exophtalmos or lid lag, no myxedema, no xanthelasma; normal ears, nose and oropharynx Neck: cannot see jugular venous pulsations and no hepatojugular reflux; brisk carotid pulses without delay and no carotid bruits Chest: clear to auscultation, no signs of consolidation by percussion or palpation, normal fremitus, symmetrical and full respiratory excursions Cardiovascular: normal position and quality of the apical impulse, regular rhythm, normal first heart sound and normal second heart sounds, no rubs or gallops, no murmur Abdomen: no tenderness or distention, no masses by palpation, no abnormal pulsatility or arterial bruits, normal bowel sounds, no hepatosplenomegaly Extremities: no clubbing, cyanosis; 2-3+ soft pitting symmetrical pedal and pretibial edema; 2+ radial, ulnar and  brachial pulses bilaterally; 2+ right femoral, posterior tibial and dorsalis pedis pulses; 2+ left femoral, posterior tibial and dorsalis pedis pulses; no subclavian or femoral bruits Neurological: grossly nonfocal   IMPRESSION: 1. Acute heart failure with both right and left heart failure features, despite home therapy with combined thiazide and loop diuretics.  2. Unstable angina pectoris with symptoms at rest, in a patient with known multivessel CAD including left main stenosis. Unclear if high filling pressures led to decompensation of coronary insufficiency or vice versa. Low threshold to repeat heart cath, but first needs rx of CHF and need careful consideration of renal complications. 3. Moderate to severe diabetes related CKD with proteinuria - may worsen with Rx of acute HF and will be a significant problem with anticipated need for coronary angiography. 4. Obesity and OSA 5. Bradycardia - Dr Stanford Breed had recommended reducing dose of metoprolol to 75 mg BID last year, but he is also receiving high dose clonidine  PLAN: Admit for IV diuretics Echo If EF is even slightly reduced or there are regional abnormalities, strongly recommend cardiac cath via radial approach. Nuclear study is unlikely to be sufficient to guide evaluation. Nephrology consultation - discussed with Dr. Posey Pronto Reduce clonidine dose Add long acting nitrates Need to hold ARB before cath, when we schedule it  Sanda Klein, MD, [facility] [phone] office [phone] pager  10/05/2014, 9:44 AM

## 2014-10-05 NOTE — Plan of Care (Signed)
Problem: Phase I Progression Outcomes Goal: Dyspnea controlled at rest (HF) Outcome: Completed/Met Date Met:  10/05/14 Goal: Pain controlled with appropriate interventions Outcome: Completed/Met Date Met:  10/05/14 Goal: Up in chair, BRP Outcome: Completed/Met Date Met:  10/05/14 Goal: Voiding-avoid urinary catheter unless indicated Outcome: Completed/Met Date Met:  10/05/14

## 2014-10-05 NOTE — ED Notes (Signed)
Patient transported to X-ray 

## 2014-10-05 NOTE — ED Notes (Signed)
EMS reported CBG 272, BP 154/67, adm 324mg  ASA, patient took 1 NTG at home

## 2014-10-05 NOTE — Progress Notes (Signed)
  Echocardiogram 2D Echocardiogram has been performed.  Louis [name] M 10/05/2014, 4:37 PM

## 2014-10-05 NOTE — ED Provider Notes (Signed)
CSN: [id number]     Arrival date & time 10/05/14  [id number] History   First MD Initiated Contact with Patient 10/05/14 [phone]     Chief Complaint  Patient presents with  . Shortness of Breath     (Consider location/radiation/quality/duration/timing/severity/associated sxs/prior Treatment) Patient is a 68 y.o. male presenting with shortness of breath. The history is provided by the patient.  Shortness of Breath Associated symptoms: chest pain   Associated symptoms: no abdominal pain, no fever, no headaches, no neck pain, no rash, no sore throat and no vomiting   pt with hx cad, chf, c/o progressive sob and intermittent chest discomfort in the past 2-3 months. Sob slowly progressive. Now notes increased fatigue and dyspnea w minimal exertion, having to stop several times when going up flight of steps. +orthopnea. No pnd. +increased leg edema, unsure of wt gain. Compliant w normal meds, no recent change in meds except had been advised to take an extra lasix tablet the past couple days.  Also w recent periods indigestion/heartburn sensation. Occurs at rest. Lasts several minutes. This morning also experienced a mid chest discomfort, dull pain, when getting ready for day. Lasted a few minutes, now resolved. No associated nv or diaphoresis. No pleuritic pain.  Pt states a week ago got over uri, where he had upper resp congestion, sinus congestion, non prod cough.      Past Medical History  Diagnosis Date  . Diabetes mellitus   . Hypertension   . Hyperlipidemia   . OSA on CPAP   . Renal insufficiency   . CAD (coronary artery disease)   . Personal history of colonic polyps - adenomas 01/28/2014   Past Surgical History  Procedure Laterality Date  . Pilonidal cyst excision    . Carpal tunnel release     Family History  Problem Relation Age of Onset  . Diabetes Mother   . Coronary artery disease Father 50    CABG  . Diabetes Father   . Colon cancer Neg Hx    History  Substance Use Topics  .  Smoking status: Former Smoker -- 1.00 packs/day for 20 years    Types: Cigarettes    Quit date: 03/06/1977  . Smokeless tobacco: Never Used  . Alcohol Use: No    Review of Systems  Constitutional: Negative for fever and chills.  HENT: Negative for sore throat.   Eyes: Negative for visual disturbance.  Respiratory: Positive for shortness of breath.   Cardiovascular: Positive for chest pain and leg swelling. Negative for palpitations.  Gastrointestinal: Negative for vomiting and abdominal pain.  Genitourinary: Negative for flank pain.  Musculoskeletal: Negative for back pain and neck pain.  Skin: Negative for rash.  Neurological: Negative for headaches.  Hematological: Does not bruise/bleed easily.  Psychiatric/Behavioral: Negative for confusion.      Allergies  Review of patient's allergies indicates no known allergies.  Home Medications   Prior to Admission medications   Medication Sig Start Date End Date Taking? Authorizing Provider  AMBULATORY NON FORMULARY MEDICATION Medication Name: diabeic shoes. Dx 250.00 10/31/11  Yes [name], MD  AMBULATORY NON FORMULARY MEDICATION BD Ultra-Fine Pen Needles ( Nano)  42mm Gauge 32. Diagnosis: Diabetes.  Pt testing 2 times a day 12/05/12  Yes [name], MD  aspirin 81 MG tablet Take 81 mg by mouth daily.     Yes Historical Provider, MD  atorvastatin (LIPITOR) 80 MG tablet Take 1 tablet (80 mg total) by mouth daily. 06/18/13  Yes Denice Bors  Crenshaw, MD  BENICAR 40 MG tablet TAKE 1 TABLET EVERY DAY 12/04/13  Yes [name], MD  Cholecalciferol (VITAMIN D-3 PO) Take 1 tablet by mouth daily.   Yes Historical Provider, MD  cloNIDine (CATAPRES) 0.3 MG tablet Take 0.3 mg by mouth 3 (three) times daily.   Yes Historical Provider, MD  furosemide (LASIX) 80 MG tablet Take 80 mg by mouth 2 (two) times daily.   Yes Historical Provider, MD  hydrALAZINE (APRESOLINE) 25 MG tablet Take 1 tablet (25 mg total) by mouth 3 (three)  times daily. 06/18/13  Yes Lelon Perla, MD  insulin aspart protamine- aspart (NOVOLOG MIX 70/30) (70-30) 100 UNIT/ML injection Inject 15-40 Units into the skin 3 (three) times daily as needed (diabetes).   Yes Historical Provider, MD  insulin detemir (LEVEMIR) 100 UNIT/ML injection Inject 120 Units into the skin at bedtime.    Yes Historical Provider, MD  meloxicam (MOBIC) 15 MG tablet Take 1 tablet (15 mg total) by mouth daily. 10/31/13  Yes [name], MD  metolazone (ZAROXOLYN) 2.5 MG tablet Take 2.5 mg by mouth daily.   Yes Historical Provider, MD  metoprolol (LOPRESSOR) 100 MG tablet Take 100 mg by mouth 2 (two) times daily.   Yes Historical Provider, MD  nitroGLYCERIN (NITROSTAT) 0.4 MG SL tablet Place 0.4 mg under the tongue every 5 (five) minutes as needed for chest pain.    Yes Historical Provider, MD  vardenafil (LEVITRA) 10 MG tablet Take 10 mg by mouth daily as needed for erectile dysfunction.   Yes Historical Provider, MD  gabapentin (NEURONTIN) 300 MG capsule One tab PO qHS for a week, then BID for a week, then TID. May double weekly to a max of 3,600mg /day Patient not taking: Reported on 10/05/2014 01/30/14   Silverio Decamp, MD  LEVITRA 10 MG tablet TAKE 1 TABLET EVERY DAY AS NEEDED Patient not taking: Reported on 10/05/2014 09/02/11   [name], MD  NOVOLOG 100 UNIT/ML injection INJECT PER SSI WITH EACH MEAL. Patient not taking: Reported on 10/05/2014 07/28/12   [name], MD  oxyCODONE-acetaminophen (PERCOCET) 5-325 MG per tablet Take 2 tablets by mouth every 4 (four) hours as needed. Patient not taking: Reported on 10/05/2014 07/27/14   Veryl Speak, MD   BP 143/63 mmHg  Pulse 56  Temp(Src) 97.9 F (36.6 C) (Oral)  Resp 15  Ht 5\' 8"  (1.727 m)  Wt 260 lb (117.935 kg)  BMI 39.54 kg/m2  SpO2 97% Physical Exam  Constitutional: He is oriented to person, place, and time. He appears well-developed and well-nourished. No distress.  HENT:   Head: Atraumatic.  Mouth/Throat: Oropharynx is clear and moist.  Eyes: Conjunctivae are normal. No scleral icterus.  Neck: Neck supple. No JVD present. No tracheal deviation present.  Cardiovascular: Normal rate, regular rhythm, normal heart sounds and intact distal pulses.  Exam reveals no gallop and no friction rub.   No murmur heard. Pulmonary/Chest: Effort normal and breath sounds normal. No accessory muscle usage. No respiratory distress.  Abdominal: Soft. Bowel sounds are normal. He exhibits no distension and no mass. There is no tenderness. There is no rebound and no guarding.  Genitourinary:  No cva tenderness  Musculoskeletal: Normal range of motion. He exhibits edema. He exhibits no tenderness.  Moderate bil lower leg edema, symmetric. No leg or calf pain or tenderness.  Neurological: He is alert and oriented to person, place, and time.  Skin: Skin is warm and dry. No rash noted. He  is not diaphoretic.  Psychiatric: He has a normal mood and affect.  Nursing note and vitals reviewed.   ED Course  Procedures (including critical care time) Labs Review   Results for orders placed or performed during the hospital encounter of 10/05/14  CBC with Differential  Result Value Ref Range   WBC 7.1 4.0 - 10.5 K/uL   RBC 4.07 (L) 4.22 - 5.81 MIL/uL   Hemoglobin 11.4 (L) 13.0 - 17.0 g/dL   HCT 36.0 (L) 39.0 - 52.0 %   MCV 88.5 78.0 - 100.0 fL   MCH 28.0 26.0 - 34.0 pg   MCHC 31.7 30.0 - 36.0 g/dL   RDW 15.2 11.5 - 15.5 %   Platelets 210 150 - 400 K/uL   Neutrophils Relative % 75 43 - 77 %   Neutro Abs 5.4 1.7 - 7.7 K/uL   Lymphocytes Relative 14 12 - 46 %   Lymphs Abs 1.0 0.7 - 4.0 K/uL   Monocytes Relative 8 3 - 12 %   Monocytes Absolute 0.6 0.1 - 1.0 K/uL   Eosinophils Relative 3 0 - 5 %   Eosinophils Absolute 0.2 0.0 - 0.7 K/uL   Basophils Relative 0 0 - 1 %   Basophils Absolute 0.0 0.0 - 0.1 K/uL  Pro b natriuretic peptide  Result Value Ref Range   Pro B Natriuretic  peptide (BNP) 1703.0 (H) 0 - 125 pg/mL  I-stat chem 8, ed  Result Value Ref Range   Sodium 137 137 - 147 mEq/L   Potassium 4.1 3.7 - 5.3 mEq/L   Chloride 102 96 - 112 mEq/L   BUN 45 (H) 6 - 23 mg/dL   Creatinine, Ser 2.40 (H) 0.50 - 1.35 mg/dL   Glucose, Bld 139 (H) 70 - 99 mg/dL   Calcium, Ion 1.12 (L) 1.13 - 1.30 mmol/L   TCO2 24 0 - 100 mmol/L   Hemoglobin 13.6 13.0 - 17.0 g/dL   HCT 40.0 39.0 - 52.0 %  I-stat troponin, ED  Result Value Ref Range   Troponin i, poc 0.00 0.00 - 0.08 ng/mL   Comment 3           Dg Chest 2 View  10/05/2014   CLINICAL DATA:  Chest pain and short breath  EXAM: CHEST  2 VIEW  COMPARISON:  None.  FINDINGS: Cardiac silhouette is enlarged. The aorta is ectatic. Mild bronchitic change centrally. No effusion, infiltrate, or pneumothorax. Degenerative osteophytosis of the thoracic spine.  IMPRESSION: Chronic bronchitic markings.  No acute findings.   Electronically Signed   By: Suzy Bouchard M.D.   On: 10/05/2014 07:25       EKG Interpretation   Date/Time:  Monday October 05 2014 06:18:43 EST Ventricular Rate:  60 PR Interval:  192 QRS Duration: 97 QT Interval:  483 QTC Calculation: 483 R Axis:   1 Text Interpretation:  Sinus rhythm Non-specific ST-t changes unchanged  Confirmed by [facility]  MD, APRIL ([id number]) on 10/05/2014 6:25:04 AM      MDM  Iv ns. Labs. Monitor. Ecg.   Reviewed nursing notes and prior charts for additional history.   Chewable asas po.   No cp.   Hx cath 2012, several mild-mod blockages noted.   Given chest discomfort, and worsening fatigue/dyspnea, pts cardiology service called to see/admit.  Recheck no cp. No new c/o as compared to prior.   Of note, pts hr as low as 45 in ED. No syncope, no cp. ?whether bblocker dose may need to be  decreased, re not only bradycardia but also symptom/fatigue improvement.   Also mild increased creatinine from prior.  Pt did recently increase lasix dose and take mobic prn.   Would d/c all nsaid use.   Cardiology eval pending.  Disposition per cardiology service.          Mirna Mires, MD 10/05/14 [phone]

## 2014-10-06 ENCOUNTER — Encounter (HOSPITAL_COMMUNITY)
Admission: EM | Disposition: A | Discharge status: Home / Self Care | Payer: Self-pay | Source: Home / Self Care | Attending: Cardiology

## 2014-10-06 LAB — GLUCOSE, CAPILLARY
Glucose-Capillary: 201 mg/dL — ABNORMAL HIGH (ref 70–99)
Glucose-Capillary: 172 mg/dL — ABNORMAL HIGH (ref 70–99)
Glucose-Capillary: 188 mg/dL — ABNORMAL HIGH (ref 70–99)
Glucose-Capillary: 265 mg/dL — ABNORMAL HIGH (ref 70–99)

## 2014-10-06 LAB — BASIC METABOLIC PANEL
ANION GAP: 15 (ref 5–15)
BUN: 53 mg/dL — ABNORMAL HIGH (ref 6–23)
CALCIUM: 9 mg/dL (ref 8.4–10.5)
CO2: 26 meq/L (ref 19–32)
CREATININE: 2.38 mg/dL — AB (ref 0.50–1.35)
Chloride: 96 mEq/L (ref 96–112)
GFR calc non Af Amer: 26 mL/min — ABNORMAL LOW (ref >90)
GFR, EST AFRICAN AMERICAN: 31 mL/min — AB (ref >90)
Glucose, Bld: 197 mg/dL — ABNORMAL HIGH (ref 70–99)
Potassium: 4.1 mEq/L (ref 3.7–5.3)
Sodium: 137 mEq/L (ref 137–147)

## 2014-10-06 LAB — PRO B NATRIURETIC PEPTIDE: Pro B Natriuretic peptide (BNP): 1375 pg/mL — ABNORMAL HIGH (ref 0–125)

## 2014-10-06 SURGERY — LEFT HEART CATHETERIZATION WITH CORONARY ANGIOGRAM
Anesthesia: LOCAL

## 2014-10-06 NOTE — Progress Notes (Signed)
Patient ID: Louis [name], male   DOB: 08/12/1946, 68 y.o.   MRN: [id number]   [facility] Progress Note    Assessment/ Plan:   1. Chronic kidney disease stage III-IV with anticipated contrast exposure (coronary angiogram). Negative cardiac enzymes and 2-D echocardiogram did not show any distinct wall motion abnormality, he has preserved ejection fraction with grade 2 diastolic dysfunction. Plans noted to postpone coronary angiogram and to optimize his aggressive medical management/ diuresis with possible discharge tomorrow if still stable. Renal function unchanged overnight, electrolytes stable. 2. Exacerbation of congestive heart failure: Switch from oral diuretic therapy currently to intravenous diuretic therapy to try and improve volume unloading. Good urine output noted overnight with symptomatic improvement-we'll continue to monitor labs 3. Anemia of chronic kidney disease: Hemoglobin greater than 11, no indications for ESA therapy at this time. 4. Hypertension: Blood pressure is fairly controlled at this time, continue to monitor with optimization of diuretic therapy anticipating further improvement as he becomes closer to euvolemic. 5. Unstable angina: negative serial cardiac enzymes-optimizing aggressive medical management per cardiology.  Subjective:   Reports to be feeling fair-no further chest pain overnight, shortness of breath improved.   Objective:   BP 120/60 mmHg  Pulse 47  Temp(Src) 97.8 F (36.6 C) (Oral)  Resp 18  Ht 5\' 8"  (1.727 m)  Wt 115 kg (253 lb 8.5 oz)  BMI 38.56 kg/m2  SpO2 98%  Intake/Output Summary (Last 24 hours) at 10/06/14 0915 Last data filed at 10/06/14 [id number]  Gross per 24 hour  Intake    920 ml  Output   2175 ml  Net  -1255 ml   Weight change: -1.588 kg (-3 lb 8 oz)  Physical Exam: [id number] sitting up in his chair [id number] regular bradycardia, S1 and S2 normal Resp:fine rales left base otherwise clear to  auscultation [id number], obese, nontender, bowel sounds normal Ext:2+-3+ lower extremity edema  Imaging: Dg Chest 2 View  10/05/2014   CLINICAL DATA:  Chest pain and short breath  EXAM: CHEST  2 VIEW  COMPARISON:  None.  FINDINGS: Cardiac silhouette is enlarged. The aorta is ectatic. Mild bronchitic change centrally. No effusion, infiltrate, or pneumothorax. Degenerative osteophytosis of the thoracic spine.  IMPRESSION: Chronic bronchitic markings.  No acute findings.   Electronically Signed   By: Suzy Bouchard M.D.   On: 10/05/2014 07:25    Labs: BMET  Recent Labs Lab 10/05/14 0810 10/06/14 0427  NA 137 137  K 4.1 4.1  CL 102 96  CO2  --  26  GLUCOSE 139* 197*  BUN 45* 53*  CREATININE 2.40* 2.38*  CALCIUM  --  9.0   CBC  Recent Labs Lab 10/05/14 0650 10/05/14 0810  WBC 7.1  --   NEUTROABS 5.4  --   HGB 11.4* 13.6  HCT 36.0* 40.0  MCV 88.5  --   PLT 210  --    Medications:    . aspirin EC  81 mg Oral Daily  . atorvastatin  80 mg Oral q1800  . cloNIDine  0.2 mg Oral TID  . furosemide  80 mg Intravenous Q12H  . heparin subcutaneous  5,000 Units Subcutaneous 3 times per day  . hydrALAZINE  25 mg Oral TID  . insulin aspart  0-20 Units Subcutaneous TID WC  . insulin detemir  120 Units Subcutaneous QHS  . irbesartan  300 mg Oral Daily  . isosorbide dinitrate  20 mg Oral TID  . metolazone  2.5 mg Oral Daily  . metoprolol  100 mg Oral BID   Elmarie Shiley, MD 10/06/2014, 9:15 AM

## 2014-10-06 NOTE — Progress Notes (Signed)
Patient Name: Louis [name] Date of Encounter: 10/06/2014     Principal Problem:   Acute decompensated heart failure Active Problems:   Diabetes mellitus with stage 3 chronic kidney disease   Obstructive sleep apnea   Proteinuria   Bradycardia   CAD (coronary artery disease), native coronary artery   Unstable angina pectoris   Acute diastolic heart failure    SUBJECTIVE  The patient has diuresed well overnight.  He has had no further chest discomfort.  His orthopnea is improved.  Rhythm remains normal sinus rhythm.  CURRENT MEDS . aspirin EC  81 mg Oral Daily  . atorvastatin  80 mg Oral q1800  . cloNIDine  0.2 mg Oral TID  . furosemide  80 mg Intravenous Q12H  . heparin subcutaneous  5,000 Units Subcutaneous 3 times per day  . hydrALAZINE  25 mg Oral TID  . insulin aspart  0-20 Units Subcutaneous TID WC  . insulin detemir  120 Units Subcutaneous QHS  . irbesartan  300 mg Oral Daily  . isosorbide dinitrate  20 mg Oral TID  . metolazone  2.5 mg Oral Daily  . metoprolol  100 mg Oral BID    OBJECTIVE  Filed Vitals:   10/05/14 1106 10/05/14 1509 10/05/14 2049 10/06/14 0542  BP: 157/70 119/58 119/55 120/60  Pulse: 51 52 52 47  Temp: 97.2 F (36.2 C) 97.6 F (36.4 C) 97.8 F (36.6 C) 97.8 F (36.6 C)  TempSrc: Oral Oral Oral Oral  Resp: 18 18 18 18   Height:      Weight:    253 lb 8.5 oz (115 kg)  SpO2: 98% 95% 96% 98%    Intake/Output Summary (Last 24 hours) at 10/06/14 0809 Last data filed at 10/06/14 0547  Gross per 24 hour  Intake    820 ml  Output   2175 ml  Net  -1355 ml   Filed Weights   10/05/14 [id number] 10/05/14 1057 10/06/14 0542  Weight: 260 lb (117.935 kg) 256 lb 8 oz (116.348 kg) 253 lb 8.5 oz (115 kg)    PHYSICAL EXAM  General: Pleasant, NAD. Neuro: Alert and oriented X 3. Moves all extremities spontaneously. Psych: Normal affect. HEENT:  Normal  Neck: Supple without bruits or JVD. Lungs:  Resp regular and unlabored, CTA. Heart: RRR no  s3, s4, or murmurs. Abdomen: Soft, non-tender, non-distended, BS + x 4.  Extremities: No clubbing, cyanosis. 2+ pretibial edema is present. DP/PT/Radials 2+ and equal bilaterally.  Accessory Clinical Findings  CBC  Recent Labs  10/05/14 0650 10/05/14 0810  WBC 7.1  --   NEUTROABS 5.4  --   HGB 11.4* 13.6  HCT 36.0* 40.0  MCV 88.5  --   PLT 210  --    Basic Metabolic Panel  Recent Labs  10/05/14 0810 10/06/14 0427  NA 137 137  K 4.1 4.1  CL 102 96  CO2  --  26  GLUCOSE 139* 197*  BUN 45* 53*  CREATININE 2.40* 2.38*  CALCIUM  --  9.0   Liver Function Tests No results for input(s): AST, ALT, ALKPHOS, BILITOT, PROT, ALBUMIN in the last 72 hours. No results for input(s): LIPASE, AMYLASE in the last 72 hours. Cardiac Enzymes  Recent Labs  10/05/14 1132 10/05/14 1537 10/05/14 2258  TROPONINI <0.30 <0.30 <0.30   BNP Invalid input(s): POCBNP D-Dimer No results for input(s): DDIMER in the last 72 hours. Hemoglobin A1C  Recent Labs  10/05/14 1132  HGBA1C 7.1*   Fasting Lipid Panel  No results for input(s): CHOL, HDL, LDLCALC, TRIG, CHOLHDL, LDLDIRECT in the last 72 hours. Thyroid Function Tests No results for input(s): TSH, T4TOTAL, T3FREE, THYROIDAB in the last 72 hours.  Invalid input(s): FREET3  TELE  Normal sinus rhythm  ECG Chronic T-wave inversions without acute change  2-D echocardiogram: - Left ventricle: The cavity size was normal. There was mild focal basal and mild concentric hypertrophy of the septum. Systolic function was vigorous. The estimated ejection fraction was in the range of 65% to 70%. Wall motion was normal; there were no regional wall motion abnormalities. Features are consistent with a pseudonormal left ventricular filling pattern, with concomitant abnormal relaxation and increased filling pressure (grade 2 diastolic dysfunction). - Mitral valve: There was mild regurgitation. - Left atrium: The atrium was  moderately dilated. - Atrial septum: No defect or patent foramen ovale was identified. Radiology/Studies  Dg Chest 2 View  10/05/2014   CLINICAL DATA:  Chest pain and short breath  EXAM: CHEST  2 VIEW  COMPARISON:  None.  FINDINGS: Cardiac silhouette is enlarged. The aorta is ectatic. Mild bronchitic change centrally. No effusion, infiltrate, or pneumothorax. Degenerative osteophytosis of the thoracic spine.  IMPRESSION: Chronic bronchitic markings.  No acute findings.   Electronically Signed   By: Suzy Bouchard M.D.   On: 10/05/2014 07:25    ASSESSMENT AND PLAN  11. Acute heart failure Improved on IV Lasix.   Acute on chronic diastolic heart failure. Weight is down 7 pounds  2. Unstable angina pectoris with symptoms at rest, in a patient with known multivessel CAD including left main stenosis. Unclear if high filling pressures led to decompensation of coronary insufficiency or vice versa. His echocardiogram shows vigorous left ventricular contraction with no segmental wall motion abnormalities.  Ejection fraction is 65-70%. 3. Moderate to severe diabetes related CKD with proteinuria -serum creatinine stable overnight 4. Obesity and OSA   Plan: Discussed with Dr. Sallyanne Kuster. His cardiac enzymes are normal 3.  His EKG was nonacute.  His echocardiogram shows no wall motion abnormalities or depression of ejection fraction.  Therefore in view of his renal insufficiency it is reasonable to continue with aggressive medical therapy of his diastolic heart failure.  We will not plan cardiac catheterization at this time.  Continue ARB.  Continue IV Lasix today.  Anticipate possible home tomorrow.  Signed, Darlin Coco MD

## 2014-10-07 LAB — BASIC METABOLIC PANEL
ANION GAP: 13 (ref 5–15)
BUN: 59 mg/dL — ABNORMAL HIGH (ref 6–23)
CO2: 26 meq/L (ref 19–32)
CREATININE: 2.48 mg/dL — AB (ref 0.50–1.35)
Calcium: 9.4 mg/dL (ref 8.4–10.5)
Chloride: 96 mEq/L (ref 96–112)
GFR calc Af Amer: 29 mL/min — ABNORMAL LOW (ref >90)
GFR calc non Af Amer: 25 mL/min — ABNORMAL LOW (ref >90)
Glucose, Bld: 192 mg/dL — ABNORMAL HIGH (ref 70–99)
Potassium: 4.1 mEq/L (ref 3.7–5.3)
Sodium: 135 mEq/L — ABNORMAL LOW (ref 137–147)

## 2014-10-07 LAB — GLUCOSE, CAPILLARY
Glucose-Capillary: 228 mg/dL — ABNORMAL HIGH (ref 70–99)
Glucose-Capillary: 143 mg/dL — ABNORMAL HIGH (ref 70–99)

## 2014-10-07 MED ORDER — CLONIDINE HCL 0.2 MG PO TABS: 0.2000 mg | ORAL_TABLET | Freq: Three times a day (TID) | ORAL | Status: DC

## 2014-10-07 MED ORDER — ISOSORBIDE DINITRATE 20 MG PO TABS: 20.0000 mg | ORAL_TABLET | Freq: Three times a day (TID) | ORAL | Status: DC

## 2014-10-07 MED ORDER — FUROSEMIDE 80 MG PO TABS
80.0000 mg | ORAL_TABLET | Freq: Two times a day (BID) | ORAL | Status: DC
  Filled 2014-10-07 (×3): qty 1

## 2014-10-07 MED ORDER — NITROGLYCERIN 0.4 MG SL SUBL: 0.4000 mg | SUBLINGUAL_TABLET | SUBLINGUAL | Status: DC | PRN

## 2014-10-07 NOTE — Plan of Care (Signed)
Problem: Discharge Progression Outcomes Goal: Able to perform self care activities Outcome: Completed/Met Date Met:  10/07/14

## 2014-10-07 NOTE — Plan of Care (Signed)
Problem: Discharge Progression Outcomes Goal: Pain controlled with appropriate interventions Outcome: Not Applicable Date Met:  80/22/33

## 2014-10-07 NOTE — Progress Notes (Signed)
At 1300 pt d/c home after receiving d/c instructions and verbalized understanding.  Ambulated  Off floor with wife at his side.  Karie Kirks, [place].

## 2014-10-07 NOTE — Plan of Care (Signed)
Problem: Discharge Progression Outcomes Goal: Complications resolved/controlled Outcome: Completed/Met Date Met:  10/07/14     

## 2014-10-07 NOTE — Plan of Care (Signed)
Problem: Discharge Progression Outcomes Goal: Barriers To Progression Addressed/Resolved Outcome: Completed/Met Date Met:  10/07/14     

## 2014-10-07 NOTE — Plan of Care (Signed)
Problem: Discharge Progression Outcomes Goal: Tolerating diet Outcome: Completed/Met Date Met:  10/07/14

## 2014-10-07 NOTE — Progress Notes (Signed)
Patient Name: Louis [name] Date of Encounter: 10/07/2014     Principal Problem:   Acute decompensated heart failure Active Problems:   Diabetes mellitus with stage 3 chronic kidney disease   Obstructive sleep apnea   Proteinuria   Bradycardia   CAD (coronary artery disease), native coronary artery   Unstable angina pectoris   Acute diastolic heart failure    SUBJECTIVE  The patient feels better.  He has had no further chest tightness.  He walked in the hall without difficulty yesterday.  His weight is down another 3 pounds today.  CURRENT MEDS . aspirin EC  81 mg Oral Daily  . atorvastatin  80 mg Oral q1800  . cloNIDine  0.2 mg Oral TID  . furosemide  80 mg Intravenous Q12H  . heparin subcutaneous  5,000 Units Subcutaneous 3 times per day  . hydrALAZINE  25 mg Oral TID  . insulin aspart  0-20 Units Subcutaneous TID WC  . insulin detemir  120 Units Subcutaneous QHS  . irbesartan  300 mg Oral Daily  . isosorbide dinitrate  20 mg Oral TID  . metolazone  2.5 mg Oral Daily  . metoprolol  100 mg Oral BID    OBJECTIVE  Filed Vitals:   10/07/14 0000 10/07/14 0129 10/07/14 0518 10/07/14 0845  BP:  130/59 132/64 138/61  Pulse: 60 48 46 51  Temp:  98.3 F (36.8 C) 98 F (36.7 C)   TempSrc:  Oral Oral   Resp: 18 20 18    Height:      Weight:   250 lb 8 oz (113.626 kg)   SpO2: 97% 98% 97%     Intake/Output Summary (Last 24 hours) at 10/07/14 0906 Last data filed at 10/07/14 0840  Gross per 24 hour  Intake   1080 ml  Output   2075 ml  Net   -995 ml   Filed Weights   10/05/14 1057 10/06/14 0542 10/07/14 0518  Weight: 256 lb 8 oz (116.348 kg) 253 lb 8.5 oz (115 kg) 250 lb 8 oz (113.626 kg)    PHYSICAL EXAM  General: Pleasant, NAD. Neuro: Alert and oriented X 3. Moves all extremities spontaneously. Psych: Normal affect. HEENT:  Normal  Neck: Supple without bruits or JVD. Lungs:  Resp regular and unlabored, CTA. Heart: RRR no s3, s4, or murmurs. Abdomen:  Soft, non-tender, non-distended, BS + x 4.  Extremities: No clubbing, cyanosis.there is 1-2+ pretibial edema. DP/PT/Radials 2+ and equal bilaterally.  Accessory Clinical Findings  CBC  Recent Labs  10/05/14 0650 10/05/14 0810  WBC 7.1  --   NEUTROABS 5.4  --   HGB 11.4* 13.6  HCT 36.0* 40.0  MCV 88.5  --   PLT 210  --    Basic Metabolic Panel  Recent Labs  10/06/14 0427 10/07/14 0459  NA 137 135*  K 4.1 4.1  CL 96 96  CO2 26 26  GLUCOSE 197* 192*  BUN 53* 59*  CREATININE 2.38* 2.48*  CALCIUM 9.0 9.4   Liver Function Tests No results for input(s): AST, ALT, ALKPHOS, BILITOT, PROT, ALBUMIN in the last 72 hours. No results for input(s): LIPASE, AMYLASE in the last 72 hours. Cardiac Enzymes  Recent Labs  10/05/14 1132 10/05/14 1537 10/05/14 2258  TROPONINI <0.30 <0.30 <0.30   BNP Invalid input(s): POCBNP D-Dimer No results for input(s): DDIMER in the last 72 hours. Hemoglobin A1C  Recent Labs  10/05/14 1132  HGBA1C 7.1*   Fasting Lipid Panel No results for input(s):  CHOL, HDL, LDLCALC, TRIG, CHOLHDL, LDLDIRECT in the last 72 hours. Thyroid Function Tests No results for input(s): TSH, T4TOTAL, T3FREE, THYROIDAB in the last 72 hours.  Invalid input(s): FREET3  TELE  Sinus bradycardia   ECG    Radiology/Studies  Dg Chest 2 View  10/05/2014   CLINICAL DATA:  Chest pain and short breath  EXAM: CHEST  2 VIEW  COMPARISON:  None.  FINDINGS: Cardiac silhouette is enlarged. The aorta is ectatic. Mild bronchitic change centrally. No effusion, infiltrate, or pneumothorax. Degenerative osteophytosis of the thoracic spine.  IMPRESSION: Chronic bronchitic markings.  No acute findings.   Electronically Signed   By: Suzy Bouchard M.D.   On: 10/05/2014 07:25    ASSESSMENT AND PLAN  1. Acute heart failure Improved on IV Lasix. Acute on chronic diastolic heart failure.  2. Unstable angina pectoris with symptoms at rest, in a patient with known multivessel  CAD including left main stenosis. Unclear if high filling pressures led to decompensation of coronary insufficiency or vice versa. His echocardiogram shows vigorous left ventricular contraction with no segmental wall motion abnormalities. Ejection fraction is 65-70%. 3. Moderate to severe diabetes related CKD with proteinuria -serum creatinine stable overnight 4. Obesity and OSA  Plan: Discussed with Dr. Sallyanne Kuster. His cardiac enzymes are normal 3. His EKG was nonacute. His echocardiogram shows no wall motion abnormalities or depression of ejection fraction. Therefore in view of his renal insufficiency it is reasonable to continue with aggressive medical therapy of his diastolic heart failure. We will not plan cardiac catheterization at this time. Continue ARB. He has not been weighing himself daily at home.  I have encouraged him to do that.   He states that he has not been compliant with taking his metolazone at home. The patient is stable for discharge today.  Follow-up with Dr. Stanford Breed or with nurse practitioner/PA in about one week for transition of care.  Signed, Darlin Coco MD

## 2014-10-07 NOTE — Plan of Care (Signed)
Problem: Discharge Progression Outcomes Goal: Hemodynamically stable Outcome: Completed/Met Date Met:  10/07/14     

## 2014-10-07 NOTE — Discharge Summary (Signed)
Discharge Summary   Patient ID: Louis [name],  MRN: [id number], DOB/AGE: 07/26/1946 68 y.o.  Admit date: 10/05/2014 Discharge date: 10/07/2014  Primary Care Provider: METHENEY,CATHERINE Primary Cardiologist: Dr. Stanford Breed  Discharge Diagnoses Principal Problem:   Acute decompensated heart failure Active Problems:   Diabetes mellitus with stage 3 chronic kidney disease   Obstructive sleep apnea   Proteinuria   Bradycardia   CAD (coronary artery disease), native coronary artery   Unstable angina pectoris   Acute diastolic heart failure   Allergies No Known Allergies  Procedures  Echocardiogram 10/05/2014 LV EF: 65% -  70%  ------------------------------------------------------------------- Indications:   CHF - 428.0.  ------------------------------------------------------------------- History:  PMH: Bradycardia. Right Bundle Branch Block. Coronary artery disease. Risk factors: Hypertension. Diabetes mellitus. Dyslipidemia.  ------------------------------------------------------------------- Study Conclusions  - Left ventricle: The cavity size was normal. There was mild focal basal and mild concentric hypertrophy of the septum. Systolic function was vigorous. The estimated ejection fraction was in the range of 65% to 70%. Wall motion was normal; there were no regional wall motion abnormalities. Features are consistent with a pseudonormal left ventricular filling pattern, with concomitant abnormal relaxation and increased filling pressure (grade 2 diastolic dysfunction). - Mitral valve: There was mild regurgitation. - Left atrium: The atrium was moderately dilated. - Atrial septum: No defect or patent foramen ovale was identified.    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 The patient was seen by Dr.  Sallyanne Kuster on 10/05/2014 with several months of worsening exertional dyspnea, now NYHA class IIIb, progressive edema and weight gain. Patient also complained of prolonged indigestion improved with sublingual nitroglycerin. His EKG is chronically abnormal with deep inverted T waves laterally, however no acute changes. ProBNP was markedly elevated at 1700. The patient was admitted to cardiology service for IV diuresis. Serial troponin were negative. Patient was originally scheduled to undergo cardiac catheterization on 11/17, however cardiac catheterization was canceled given no change in the EKG, flat troponin, and poor renal function. Echocardiogram was obtained on 11/17 which showed EF 65-70%, mild MR, moderately dilated LA, grade 2 diastolic dysfunction. Dr. Mare Ferrari had a discussion with Dr. Sallyanne Kuster and felt the patient should have intensified medical management for now given preserved EF and lack of wall motion abnormality.   Patient was seen in the morning of 10/07/2014, at which time he was ambulating without significant shortness of breath. He has no further chest tightness. His weight is down 3 pounds to 250lbs. It appears the patient has not been compliant with metolazone and daily weight, emphasis has been placed on compliance with medication and daily evaluation of his weight. Patient has been advised to contact cardiology if his weight increased by more than 3 pounds overnight or 5 pounds in a single week. He has also been advised to be compliant with salt and fluid restriction.  Of note, he does have some bradycardia with metoprolol, the dose should be decreased if he is symptomatic with bradycardia. At this time, he is asymptomatic. His clonidine was decreased to allow blood pressure room for addition of long acting nitrate. Patient has been advised not to take Levitra within 24 hours of taking long-acting nitrate as the combination of the two can cause severe hypotension. An transition of care  followup has been scheduled with Dr. Jacalyn Lefevre office.    Discharge Vitals Blood pressure 136/61, pulse 53, temperature 98 F (36.7 C), temperature  source Oral, resp. rate 18, height 5\' 8"  (1.727 m), weight 250 lb 8 oz (113.626 kg), SpO2 97 %.  Filed Weights   10/05/14 1057 10/06/14 0542 10/07/14 0518  Weight: 256 lb 8 oz (116.348 kg) 253 lb 8.5 oz (115 kg) 250 lb 8 oz (113.626 kg)    Labs  CBC  Recent Labs  10/05/14 0650 10/05/14 0810  WBC 7.1  --   NEUTROABS 5.4  --   HGB 11.4* 13.6  HCT 36.0* 40.0  MCV 88.5  --   PLT 210  --    Basic Metabolic Panel  Recent Labs  10/06/14 0427 10/07/14 0459  NA 137 135*  K 4.1 4.1  CL 96 96  CO2 26 26  GLUCOSE 197* 192*  BUN 53* 59*  CREATININE 2.38* 2.48*  CALCIUM 9.0 9.4   Cardiac Enzymes  Recent Labs  10/05/14 1132 10/05/14 1537 10/05/14 2258  TROPONINI <0.30 <0.30 <0.30   Hemoglobin A1C  Recent Labs  10/05/14 1132  HGBA1C 7.1*    Disposition  Pt is being discharged home today in good condition.  Follow-up Plans & Appointments      Follow-up Information    Follow up with [facility], NP On 10/19/2014.   Specialty:  Cardiology   Why:  8am   Contact information:   [address] [place]  [address] [phone]       Discharge Medications    Medication List    STOP taking these medications        oxyCODONE-acetaminophen 5-325 MG per tablet  Commonly known as:  PERCOCET      TAKE these medications        AMBULATORY NON FORMULARY MEDICATION  Medication Name: diabeic shoes. Dx 250.00     AMBULATORY NON FORMULARY MEDICATION  BD Ultra-Fine Pen Needles ( Nano)  25mm Gauge 32. Diagnosis: Diabetes.  Pt testing 2 times a day     aspirin 81 MG tablet  Take 81 mg by mouth daily.     atorvastatin 80 MG tablet  Commonly known as:  LIPITOR  Take 1 tablet (80 mg total) by mouth daily.     BENICAR 40 MG tablet  Generic drug:  olmesartan  TAKE 1 TABLET EVERY DAY      cloNIDine 0.2 MG tablet  Commonly known as:  CATAPRES  Take 1 tablet (0.2 mg total) by mouth 3 (three) times daily.     furosemide 80 MG tablet  Commonly known as:  LASIX  Take 80 mg by mouth 2 (two) times daily.     gabapentin 300 MG capsule  Commonly known as:  NEURONTIN  One tab PO qHS for a week, then BID for a week, then TID. May double weekly to a max of 3,600mg /day     hydrALAZINE 25 MG tablet  Commonly known as:  APRESOLINE  Take 1 tablet (25 mg total) by mouth 3 (three) times daily.     insulin aspart protamine- aspart (70-30) 100 UNIT/ML injection  Commonly known as:  NOVOLOG MIX 70/30  Inject 15-40 Units into the skin 3 (three) times daily as needed (diabetes).     insulin detemir 100 UNIT/ML injection  Commonly known as:  LEVEMIR  Inject 120 Units into the skin at bedtime.     isosorbide dinitrate 20 MG tablet  Commonly known as:  ISORDIL  Take 1 tablet (20 mg total) by mouth 3 (three) times daily.     LEVITRA 10 MG tablet  Generic drug:  vardenafil  TAKE 1 TABLET EVERY DAY AS NEEDED     meloxicam 15 MG tablet  Commonly known as:  MOBIC  Take 1 tablet (15 mg total) by mouth daily.     metolazone 2.5 MG tablet  Commonly known as:  ZAROXOLYN  Take 2.5 mg by mouth daily.     metoprolol 100 MG tablet  Commonly known as:  LOPRESSOR  Take 100 mg by mouth 2 (two) times daily.     nitroGLYCERIN 0.4 MG SL tablet  Commonly known as:  NITROSTAT  Place 1 tablet (0.4 mg total) under the tongue every 5 (five) minutes as needed for chest pain.     NOVOLOG 100 UNIT/ML injection  Generic drug:  insulin aspart  INJECT PER SSI WITH EACH MEAL.     VITAMIN D-3 PO  Take 1 tablet by mouth daily.        Duration of Discharge Encounter   Greater than 30 minutes including physician time.  Hilbert Corrigan PA-C Pager: [phone] 10/07/2014, 10:43 AM

## 2014-10-07 NOTE — Progress Notes (Signed)
Patient ID: Louis [name], male   DOB: 1946-01-26, 68 y.o.   MRN: [id number]  [facility] Progress Note    Assessment/ Plan:   1. Chronic kidney disease stage III-IV with anticipated contrast exposure (coronary angiogram). Coronary angiography postponed after negative cardiac enzymes/2-D echocardiogram findings. Slight rise of creatinine noted with aggressive diuresis-will need to follow closely upon discharge with labs. 2. Exacerbation of congestive heart failure: Clinically better with increased diuretics/intravenous therapy. Anticipated discharge later on today on oral diuretic therapy with education regarding compliance. 3. Anemia of chronic kidney disease: Hemoglobin greater than 11, no indications for ESA therapy at this time. 4. Hypertension: Blood pressure is fairly controlled at this time, anticipating further improvement as diuretic therapy becomes optimized. 5. Unstable angina: negative serial cardiac enzymes-aggressive medical management per cardiology.  Subjective:   Reports continued improvement of shortness of breath-will improve his metolazone compliance   Objective:   BP 138/61 mmHg  Pulse 51  Temp(Src) 98 F (36.7 C) (Oral)  Resp 18  Ht 5\' 8"  (1.727 m)  Wt 113.626 kg (250 lb 8 oz)  BMI 38.10 kg/m2  SpO2 97%  Intake/Output Summary (Last 24 hours) at 10/07/14 0919 Last data filed at 10/07/14 0840  Gross per 24 hour  Intake    600 ml  Output   2075 ml  Net  -1475 ml   Weight change: -2.722 kg (-6 lb)  Physical Exam: [id number] resting up in his recliner, wife at bedside [id number] regular bradycardia, S1 and S2 normal Resp:fine left base rales otherwise clear to auscultation [id number], obese, nontender Ext:2+ lower extremity edema  Imaging: No results found.  Labs: BMET  Recent Labs Lab 10/05/14 0810 10/06/14 0427 10/07/14 0459  NA 137 137 135*  K 4.1 4.1 4.1  CL 102 96 96  CO2  --  26 26  GLUCOSE 139* 197* 192*  BUN 45* 53*  59*  CREATININE 2.40* 2.38* 2.48*  CALCIUM  --  9.0 9.4   CBC  Recent Labs Lab 10/05/14 0650 10/05/14 0810  WBC 7.1  --   NEUTROABS 5.4  --   HGB 11.4* 13.6  HCT 36.0* 40.0  MCV 88.5  --   PLT 210  --     Medications:    . aspirin EC  81 mg Oral Daily  . atorvastatin  80 mg Oral q1800  . cloNIDine  0.2 mg Oral TID  . furosemide  80 mg Intravenous Q12H  . heparin subcutaneous  5,000 Units Subcutaneous 3 times per day  . hydrALAZINE  25 mg Oral TID  . insulin aspart  0-20 Units Subcutaneous TID WC  . insulin detemir  120 Units Subcutaneous QHS  . irbesartan  300 mg Oral Daily  . isosorbide dinitrate  20 mg Oral TID  . metolazone  2.5 mg Oral Daily  . metoprolol  100 mg Oral BID   Elmarie Shiley, MD 10/07/2014, 9:19 AM

## 2014-10-07 NOTE — Discharge Instructions (Signed)

## 2014-10-07 NOTE — Plan of Care (Signed)
Problem: Discharge Progression Outcomes Goal: Activity appropriate for discharge plan Outcome: Completed/Met Date Met:  10/07/14

## 2014-10-07 NOTE — Plan of Care (Signed)
Problem: Discharge Progression Outcomes Goal: Discharge plan in place and appropriate Outcome: Completed/Met Date Met:  10/07/14     

## 2014-10-08 DIAGNOSIS — D631 Anemia in chronic kidney disease: Secondary | ICD-10-CM | POA: Diagnosis not present

## 2014-10-08 DIAGNOSIS — N183 Chronic kidney disease, stage 3 (moderate): Secondary | ICD-10-CM | POA: Diagnosis not present

## 2014-10-08 DIAGNOSIS — R809 Proteinuria, unspecified: Secondary | ICD-10-CM | POA: Diagnosis not present

## 2014-10-08 DIAGNOSIS — I129 Hypertensive chronic kidney disease with stage 1 through stage 4 chronic kidney disease, or unspecified chronic kidney disease: Secondary | ICD-10-CM | POA: Diagnosis not present

## 2014-10-10 DIAGNOSIS — M5126 Other intervertebral disc displacement, lumbar region: Secondary | ICD-10-CM | POA: Diagnosis not present

## 2014-10-10 DIAGNOSIS — M9903 Segmental and somatic dysfunction of lumbar region: Secondary | ICD-10-CM | POA: Diagnosis not present

## 2014-10-12 ENCOUNTER — Telehealth: Payer: Self-pay | Admitting: Cardiology

## 2014-10-12 ENCOUNTER — Other Ambulatory Visit: Payer: Self-pay | Admitting: *Deleted

## 2014-10-12 DIAGNOSIS — E785 Hyperlipidemia, unspecified: Secondary | ICD-10-CM

## 2014-10-12 DIAGNOSIS — R001 Bradycardia, unspecified: Secondary | ICD-10-CM

## 2014-10-12 DIAGNOSIS — I10 Essential (primary) hypertension: Secondary | ICD-10-CM

## 2014-10-12 MED ORDER — ATORVASTATIN CALCIUM 80 MG PO TABS: 80.0000 mg | ORAL_TABLET | Freq: Every day | ORAL | Status: DC

## 2014-10-12 NOTE — Telephone Encounter (Signed)
Received records from [facility] (Dr Mercy Moore) for appointment with Cecilie Kicks, NP on 10/19/14.  Records given to [employer] (medical records) for Laura's schedule on 10/19/14.  lp

## 2014-10-13 ENCOUNTER — Ambulatory Visit (INDEPENDENT_AMBULATORY_CARE_PROVIDER_SITE_OTHER): Payer: Medicare Other | Admitting: Family Medicine

## 2014-10-13 VITALS — Temp 97.8°F

## 2014-10-13 DIAGNOSIS — M5126 Other intervertebral disc displacement, lumbar region: Secondary | ICD-10-CM | POA: Diagnosis not present

## 2014-10-13 DIAGNOSIS — M9903 Segmental and somatic dysfunction of lumbar region: Secondary | ICD-10-CM | POA: Diagnosis not present

## 2014-10-13 DIAGNOSIS — Z23 Encounter for immunization: Secondary | ICD-10-CM

## 2014-10-13 NOTE — Progress Notes (Signed)
   Subjective:    Patient ID: Louis [name], male    DOB: 04/17/1946, 68 y.o.   MRN: [id number]  HPI  Louis [name] reported for his flu shot. He was counseled on immunization and possible side effects. He denies recent illness or fever. Louis [name], CMA    Review of Systems     Objective:   Physical Exam        Assessment & Plan:

## 2014-10-19 ENCOUNTER — Encounter: Payer: Self-pay | Admitting: Cardiology

## 2014-10-19 ENCOUNTER — Ambulatory Visit (INDEPENDENT_AMBULATORY_CARE_PROVIDER_SITE_OTHER): Payer: Medicare Other | Admitting: Cardiology

## 2014-10-19 VITALS — BP 107/58 | HR 64 | Ht 68.0 in | Wt 247.8 lb

## 2014-10-19 DIAGNOSIS — I5031 Acute diastolic (congestive) heart failure: Secondary | ICD-10-CM

## 2014-10-19 DIAGNOSIS — E1322 Other specified diabetes mellitus with diabetic chronic kidney disease: Secondary | ICD-10-CM | POA: Diagnosis not present

## 2014-10-19 DIAGNOSIS — N183 Chronic kidney disease, stage 3 unspecified: Secondary | ICD-10-CM

## 2014-10-19 DIAGNOSIS — R252 Cramp and spasm: Secondary | ICD-10-CM

## 2014-10-19 DIAGNOSIS — I2510 Atherosclerotic heart disease of native coronary artery without angina pectoris: Secondary | ICD-10-CM | POA: Diagnosis not present

## 2014-10-19 DIAGNOSIS — E1122 Type 2 diabetes mellitus with diabetic chronic kidney disease: Secondary | ICD-10-CM

## 2014-10-19 DIAGNOSIS — R001 Bradycardia, unspecified: Secondary | ICD-10-CM | POA: Diagnosis not present

## 2014-10-19 LAB — BASIC METABOLIC PANEL
BUN: 115 mg/dL — AB (ref 6–23)
CALCIUM: 9.8 mg/dL (ref 8.4–10.5)
CO2: 26 mEq/L (ref 19–32)
Chloride: 91 mEq/L — ABNORMAL LOW (ref 96–112)
Creat: 3.79 mg/dL — ABNORMAL HIGH (ref 0.50–1.35)
Glucose, Bld: 131 mg/dL — ABNORMAL HIGH (ref 70–99)
Potassium: 4.5 mEq/L (ref 3.5–5.3)
SODIUM: 131 meq/L — AB (ref 135–145)

## 2014-10-19 LAB — MAGNESIUM: MAGNESIUM: 2.1 mg/dL (ref 1.5–2.5)

## 2014-10-19 NOTE — Assessment & Plan Note (Signed)
Stable diabetes, follow with nephrology for renal disease.

## 2014-10-19 NOTE — Progress Notes (Signed)
10/19/2014   PCP: [name], MD   Chief Complaint  Patient presents with  . Follow-up    post hospital discharge 11/16, dyspnea has improved, no chest pain.     Primary Cardiologist: Dr. Thresa Ross   HPI:  68 year old  Male here today for hospital follow up for diastolic HF.  EF 65-70%.    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 The patient was seen by Dr. Sallyanne Kuster on 10/05/2014 with several months of worsening exertional dyspnea, now NYHA class IIIb, progressive edema and weight gain. Patient also complained of prolonged indigestion improved with sublingual nitroglycerin. His EKG is chronically abnormal with deep inverted T waves laterally, however no acute changes. ProBNP was markedly elevated at 1700. He was diuresed without complications.  His Cr at discharge was 2.38.  Weight down 3 lbs.    Today no chest pain.  He does have leg cramps with the diuresing.  He has seen Dr. Ihor Austin who agreed with diuresis and continue current diuretics unless increased renal insuf.  Pt's energy is decreased but he was able to use saw to trim tree limbs.  He did increase a Lt shoulder injury.  I have asked to follow up with ortho.   His wt is stable at 243.  No SOB.    No Known Allergies  Current Outpatient Prescriptions  Medication Sig Dispense Refill  . aspirin 81 MG tablet Take 81 mg by mouth daily.      Marland Kitchen atorvastatin (LIPITOR) 80 MG tablet Take 1 tablet (80 mg total) by mouth daily. 30 tablet 0  . BENICAR 40 MG tablet TAKE 1 TABLET EVERY DAY 30 tablet 5  . Cholecalciferol (VITAMIN D-3 PO) Take 1 tablet by mouth daily.    . cloNIDine (CATAPRES) 0.2 MG tablet Take 1 tablet (0.2 mg total) by mouth 3 (three) times daily. 90 tablet 3  . furosemide (LASIX) 80 MG tablet Take 80 mg by mouth 2 (two) times daily.    . hydrALAZINE (APRESOLINE) 25 MG tablet Take 1 tablet (25 mg total) by mouth  3 (three) times daily. 270 tablet 4  . insulin aspart protamine- aspart (NOVOLOG MIX 70/30) (70-30) 100 UNIT/ML injection Inject 15-40 Units into the skin 3 (three) times daily as needed (diabetes).    . insulin detemir (LEVEMIR) 100 UNIT/ML injection Inject 120 Units into the skin at bedtime.     . metolazone (ZAROXOLYN) 2.5 MG tablet Take 2.5 mg by mouth daily.    . metoprolol (LOPRESSOR) 100 MG tablet Take 100 mg by mouth 2 (two) times daily.    . nitroGLYCERIN (NITROSTAT) 0.4 MG SL tablet Place 1 tablet (0.4 mg total) under the tongue every 5 (five) minutes as needed for chest pain. 25 tablet 0   No current facility-administered medications for this visit.    Past Medical History  Diagnosis Date  . Diabetes mellitus   . Hypertension   . Hyperlipidemia   . OSA on CPAP   . Renal insufficiency   . CAD (coronary artery disease)   . Personal history of colonic polyps - adenomas 01/28/2014  . Heart failure, diastolic   . Shortness of breath dyspnea     Past Surgical History  Procedure Laterality Date  . Pilonidal cyst excision    . Carpal tunnel release      [id number] colds or fevers, weight Stable Skin:no rashes or ulcers HEENT:no  blurred vision, no congestion CV:see HPI PUL:see HPI GI:no diarrhea constipation or melena, no indigestion GU:no hematuria, no dysuria MS:+ Lt shoulder pain, no claudication Neuro:no syncope, no lightheadedness Endo:+ diabetes stable, no thyroid disease  Wt Readings from Last 3 Encounters:  10/19/14 247 lb 12.8 oz (112.401 kg)  10/07/14 250 lb 8 oz (113.626 kg)  07/27/14 245 lb (111.131 kg)  at home wt is 243  PHYSICAL EXAM BP 107/58 mmHg  Pulse 64  Ht 5\' 8"  (1.727 m)  Wt 247 lb 12.8 oz (112.401 kg)  BMI 37.69 kg/m2 General:Pleasant affect, NAD Skin:Warm and dry, brisk capillary refill HEENT:normocephalic, sclera clear, mucus membranes moist Neck:supple, no JVD, no bruits  Heart:S1S2 RRR without murmur, gallup, rub or  click Lungs:clear without rales, rhonchi, or wheezes [id number], non tender, + BS, do not palpate liver spleen or masses Ext:no lower ext edema, 2+ pedal pulses, 2+ radial pulses, + lt shoulder pain to palpation. Neuro:alert and oriented X 3, MAE, follows commands, + facial symmetry   ASSESSMENT AND PLAN Acute diastolic heart failure Now with diuresing improved, energy is still low.   Weight is stable, no significant SOB  Diabetes mellitus with stage 3 chronic kidney disease Stable diabetes, follow with nephrology for renal disease.  Bradycardia No bradycardia today  CAD (coronary artery disease), native coronary artery No chest pain

## 2014-10-19 NOTE — Patient Instructions (Signed)
Your physician recommends that you return for lab work   Your physician recommends that you schedule a follow-up appointment with Dr.Crenshaw in [place] in [place]

## 2014-10-19 NOTE — Assessment & Plan Note (Signed)
Now with diuresing improved, energy is still low.   Weight is stable, no significant SOB

## 2014-10-19 NOTE — Assessment & Plan Note (Signed)
No chest pain

## 2014-10-19 NOTE — Assessment & Plan Note (Signed)
No bradycardia today.

## 2014-10-20 ENCOUNTER — Other Ambulatory Visit: Payer: Self-pay | Admitting: *Deleted

## 2014-10-20 ENCOUNTER — Ambulatory Visit: Payer: Medicare Other | Admitting: Sports Medicine

## 2014-10-20 DIAGNOSIS — M9903 Segmental and somatic dysfunction of lumbar region: Secondary | ICD-10-CM | POA: Diagnosis not present

## 2014-10-20 DIAGNOSIS — N183 Chronic kidney disease, stage 3 unspecified: Secondary | ICD-10-CM

## 2014-10-20 DIAGNOSIS — M5126 Other intervertebral disc displacement, lumbar region: Secondary | ICD-10-CM | POA: Diagnosis not present

## 2014-10-22 DIAGNOSIS — M9903 Segmental and somatic dysfunction of lumbar region: Secondary | ICD-10-CM | POA: Diagnosis not present

## 2014-10-22 DIAGNOSIS — M5126 Other intervertebral disc displacement, lumbar region: Secondary | ICD-10-CM | POA: Diagnosis not present

## 2014-10-23 DIAGNOSIS — N183 Chronic kidney disease, stage 3 (moderate): Secondary | ICD-10-CM | POA: Diagnosis not present

## 2014-10-24 ENCOUNTER — Telehealth: Payer: Self-pay | Admitting: Physician Assistant

## 2014-10-24 DIAGNOSIS — N189 Chronic kidney disease, unspecified: Secondary | ICD-10-CM

## 2014-10-24 LAB — BASIC METABOLIC PANEL WITH GFR
BUN: 109 mg/dL — ABNORMAL HIGH (ref 6–23)
CALCIUM: 9.5 mg/dL (ref 8.4–10.5)
CHLORIDE: 94 meq/L — AB (ref 96–112)
CO2: 27 mEq/L (ref 19–32)
CREATININE: 3.03 mg/dL — AB (ref 0.50–1.35)
GFR, EST AFRICAN AMERICAN: 23 mL/min — AB
GFR, Est Non African American: 20 mL/min — ABNORMAL LOW
Glucose, Bld: 128 mg/dL — ABNORMAL HIGH (ref 70–99)
Potassium: 4.7 mEq/L (ref 3.5–5.3)
Sodium: 132 mEq/L — ABNORMAL LOW (ref 135–145)

## 2014-10-24 NOTE — Telephone Encounter (Signed)
I spoke to Doristine Section, the patient's wife, She wrote down the instructions for lasix which are:  Hold today and tomorrow.  Start 40mg  BID on Monday.  Take and additional 40mg  daily iof weight increases 2 pounds in 24 hrs of 5 pounds in a week.  BMET Tuesday.  Xadrian Craighead, PAC

## 2014-10-27 ENCOUNTER — Telehealth: Payer: Self-pay | Admitting: Cardiology

## 2014-10-27 ENCOUNTER — Other Ambulatory Visit: Payer: Self-pay | Admitting: *Deleted

## 2014-10-27 DIAGNOSIS — N183 Chronic kidney disease, stage 3 unspecified: Secondary | ICD-10-CM

## 2014-10-27 DIAGNOSIS — M9903 Segmental and somatic dysfunction of lumbar region: Secondary | ICD-10-CM | POA: Diagnosis not present

## 2014-10-27 DIAGNOSIS — N189 Chronic kidney disease, unspecified: Secondary | ICD-10-CM | POA: Diagnosis not present

## 2014-10-27 DIAGNOSIS — M5126 Other intervertebral disc displacement, lumbar region: Secondary | ICD-10-CM | POA: Diagnosis not present

## 2014-10-27 NOTE — Telephone Encounter (Signed)
Pt is at [facility] in [place] talked to [place].

## 2014-10-28 LAB — BASIC METABOLIC PANEL WITH GFR
BUN: 86 mg/dL — ABNORMAL HIGH (ref 6–23)
CALCIUM: 9.7 mg/dL (ref 8.4–10.5)
CO2: 24 mEq/L (ref 19–32)
Chloride: 100 mEq/L (ref 96–112)
Creat: 2.55 mg/dL — ABNORMAL HIGH (ref 0.50–1.35)
GFR, EST AFRICAN AMERICAN: 29 mL/min — AB
GFR, EST NON AFRICAN AMERICAN: 25 mL/min — AB
Glucose, Bld: 179 mg/dL — ABNORMAL HIGH (ref 70–99)
Potassium: 4.8 mEq/L (ref 3.5–5.3)
SODIUM: 133 meq/L — AB (ref 135–145)

## 2014-10-29 ENCOUNTER — Other Ambulatory Visit: Payer: Self-pay | Admitting: *Deleted

## 2014-10-29 ENCOUNTER — Encounter: Payer: Self-pay | Admitting: *Deleted

## 2014-10-29 DIAGNOSIS — N183 Chronic kidney disease, stage 3 unspecified: Secondary | ICD-10-CM

## 2014-10-29 MED ORDER — FUROSEMIDE 40 MG PO TABS: 60.0000 mg | ORAL_TABLET | Freq: Two times a day (BID) | ORAL | Status: DC

## 2014-10-29 NOTE — Progress Notes (Signed)
This encounter was created in error - please disregard.

## 2014-10-31 DIAGNOSIS — M9903 Segmental and somatic dysfunction of lumbar region: Secondary | ICD-10-CM | POA: Diagnosis not present

## 2014-10-31 DIAGNOSIS — M5126 Other intervertebral disc displacement, lumbar region: Secondary | ICD-10-CM | POA: Diagnosis not present

## 2014-11-03 DIAGNOSIS — M9903 Segmental and somatic dysfunction of lumbar region: Secondary | ICD-10-CM | POA: Diagnosis not present

## 2014-11-03 DIAGNOSIS — M5126 Other intervertebral disc displacement, lumbar region: Secondary | ICD-10-CM | POA: Diagnosis not present

## 2014-11-05 DIAGNOSIS — D631 Anemia in chronic kidney disease: Secondary | ICD-10-CM | POA: Diagnosis not present

## 2014-11-05 DIAGNOSIS — N183 Chronic kidney disease, stage 3 (moderate): Secondary | ICD-10-CM | POA: Diagnosis not present

## 2014-11-05 DIAGNOSIS — I129 Hypertensive chronic kidney disease with stage 1 through stage 4 chronic kidney disease, or unspecified chronic kidney disease: Secondary | ICD-10-CM | POA: Diagnosis not present

## 2014-11-05 DIAGNOSIS — N184 Chronic kidney disease, stage 4 (severe): Secondary | ICD-10-CM | POA: Diagnosis not present

## 2014-11-05 DIAGNOSIS — N2581 Secondary hyperparathyroidism of renal origin: Secondary | ICD-10-CM | POA: Diagnosis not present

## 2014-11-05 DIAGNOSIS — R809 Proteinuria, unspecified: Secondary | ICD-10-CM | POA: Diagnosis not present

## 2014-11-06 DIAGNOSIS — E11321 Type 2 diabetes mellitus with mild nonproliferative diabetic retinopathy with macular edema: Secondary | ICD-10-CM | POA: Diagnosis not present

## 2014-11-06 DIAGNOSIS — Z87891 Personal history of nicotine dependence: Secondary | ICD-10-CM | POA: Diagnosis not present

## 2014-11-06 DIAGNOSIS — Z9889 Other specified postprocedural states: Secondary | ICD-10-CM | POA: Diagnosis not present

## 2014-11-09 DIAGNOSIS — M9903 Segmental and somatic dysfunction of lumbar region: Secondary | ICD-10-CM | POA: Diagnosis not present

## 2014-11-09 DIAGNOSIS — M5126 Other intervertebral disc displacement, lumbar region: Secondary | ICD-10-CM | POA: Diagnosis not present

## 2014-11-10 ENCOUNTER — Encounter: Payer: Self-pay | Admitting: Cardiology

## 2014-11-15 ENCOUNTER — Other Ambulatory Visit: Payer: Self-pay | Admitting: Cardiology

## 2014-11-16 DIAGNOSIS — M5126 Other intervertebral disc displacement, lumbar region: Secondary | ICD-10-CM | POA: Diagnosis not present

## 2014-11-16 DIAGNOSIS — M9903 Segmental and somatic dysfunction of lumbar region: Secondary | ICD-10-CM | POA: Diagnosis not present

## 2014-11-16 NOTE — Telephone Encounter (Signed)
Rx(s) sent to pharmacy electronically.  

## 2014-11-17 ENCOUNTER — Ambulatory Visit (INDEPENDENT_AMBULATORY_CARE_PROVIDER_SITE_OTHER): Payer: Medicare Other | Admitting: Sports Medicine

## 2014-11-17 ENCOUNTER — Encounter: Payer: Self-pay | Admitting: Sports Medicine

## 2014-11-17 VITALS — BP 137/58 | HR 93 | Ht 68.0 in | Wt 244.0 lb

## 2014-11-17 DIAGNOSIS — I2510 Atherosclerotic heart disease of native coronary artery without angina pectoris: Secondary | ICD-10-CM | POA: Diagnosis not present

## 2014-11-17 DIAGNOSIS — G5601 Carpal tunnel syndrome, right upper limb: Secondary | ICD-10-CM | POA: Diagnosis not present

## 2014-11-17 DIAGNOSIS — G5602 Carpal tunnel syndrome, left upper limb: Secondary | ICD-10-CM | POA: Diagnosis not present

## 2014-11-17 DIAGNOSIS — G5603 Carpal tunnel syndrome, bilateral upper limbs: Secondary | ICD-10-CM

## 2014-11-17 DIAGNOSIS — M25512 Pain in left shoulder: Secondary | ICD-10-CM | POA: Diagnosis not present

## 2014-11-17 DIAGNOSIS — S42302A Unspecified fracture of shaft of humerus, left arm, initial encounter for closed fracture: Secondary | ICD-10-CM | POA: Insufficient documentation

## 2014-11-17 NOTE — Assessment & Plan Note (Signed)
Right side continues to be pain and numbness free since median nerve hydrodissection in March. He is post-carpal tunnel release on the left side with persistent symptoms, we will do a median nerve hydrodissection on the left side today.

## 2014-11-17 NOTE — Progress Notes (Signed)
  Subjective:    CC: Left shoulder pain  HPI: This is a pleasant 68 year old male, approximately 6 weeks ago he was cutting some trees, afterwards the next day he developed severe pain over the top of his left shoulder, pain was so severe he was unable to move it.  pain is localized over the acromioclavicular joint without radiation, severe, persistent.  Carpal tunnel syndrome: Median nerve hydrodissection on the right side 9 months ago continues to be effective, on the left side he had a carpal tunnel release but unfortunately continues to have numbness and tingling since the surgery without any improvement.  Past medical history, Surgical history, Family history not pertinant except as noted below, Social history, Allergies, and medications have been entered into the medical record, reviewed, and no changes needed.   Review of Systems: No fevers, chills, night sweats, weight loss, chest pain, or shortness of breath.   Objective:    General: Well Developed, well nourished, and in no acute distress.  Neuro: Alert and oriented x3, extra-ocular muscles intact, sensation grossly intact.  HEENT: Normocephalic, atraumatic, pupils equal round reactive to light, neck supple, no masses, no lymphadenopathy, thyroid nonpalpable.  Skin: Warm and dry, no rashes. Cardiac: Regular rate and rhythm, no murmurs rubs or gallops, no lower extremity edema.  Respiratory: Clear to auscultation bilaterally. Not using accessory muscles, speaking in full sentences. Left Shoulder: Inspection reveals no abnormalities, atrophy or asymmetry. Exquisite tenderness to palpation over the acromioclavicular joint. Range of motion is very limited with only 5 of external rotation. Rotator cuff strength normal throughout. No signs of impingement with negative Neer and Hawkin's tests, empty can. Speeds and Yergason's tests normal. No labral pathology noted with negative Obrien's, negative crank, negative clunk, and good  stability. Normal scapular function observed. No painful arc and no drop arm sign. No apprehension sign  Procedure: Real-time Ultrasound Guided Injection of left acromioclavicular joint Device: GE Logiq E  Verbal informed consent obtained.  Time-out conducted.  Noted no overlying erythema, induration, or other signs of local infection.  Skin prepped in a sterile fashion.  Local anesthesia: Topical Ethyl chloride.  With sterile technique and under real time ultrasound guidance:  0.5 mL kenalog 40, 0.5 mL lidocaine injected easily into a clearly effused acromioclavicular joint Completed without difficulty  Pain immediately resolved suggesting accurate placement of the medication.  Advised to call if fevers/chills, erythema, induration, drainage, or persistent bleeding.  Images permanently stored and available for review in the ultrasound unit.  Impression: Technically successful ultrasound guided injection.  Procedure: Real-time Ultrasound Guided Injection of left median nerve hydrodissection Device: GE Logiq E  Verbal informed consent obtained.  Time-out conducted.  Noted no overlying erythema, induration, or other signs of local infection.  Skin prepped in a sterile fashion.  Local anesthesia: Topical Ethyl chloride.  With sterile technique and under real time ultrasound guidance:  Using a 25-gauge needle I injected a total of 1 mL kenalog 40, 4 mL lidocaine both superficial to and deep to the median nerve in the carpal tunnel freeing it from surrounding structures. Completed without difficulty  Pain immediately resolved suggesting accurate placement of the medication.  Advised to call if fevers/chills, erythema, induration, drainage, or persistent bleeding.  Images permanently stored and available for review in the ultrasound unit.  Impression: Technically successful ultrasound guided injection.  Impression and Recommendations:

## 2014-11-17 NOTE — Assessment & Plan Note (Signed)
Pain is referable to the left acromioclavicular joint. He also has an element of adhesive capsulitis. Acromial clavicular injection as above. Formal physical therapy. Return in one month.

## 2014-11-18 DIAGNOSIS — N183 Chronic kidney disease, stage 3 (moderate): Secondary | ICD-10-CM | POA: Diagnosis not present

## 2014-11-18 LAB — BASIC METABOLIC PANEL WITH GFR
BUN: 73 mg/dL — ABNORMAL HIGH (ref 6–23)
CHLORIDE: 89 meq/L — AB (ref 96–112)
CO2: 23 meq/L (ref 19–32)
Calcium: 9.2 mg/dL (ref 8.4–10.5)
Creat: 2.6 mg/dL — ABNORMAL HIGH (ref 0.50–1.35)
GFR, EST NON AFRICAN AMERICAN: 24 mL/min — AB
GFR, Est African American: 28 mL/min — ABNORMAL LOW
Glucose, Bld: 436 mg/dL — ABNORMAL HIGH (ref 70–99)
POTASSIUM: 4.4 meq/L (ref 3.5–5.3)
Sodium: 129 mEq/L — ABNORMAL LOW (ref 135–145)

## 2014-11-23 ENCOUNTER — Other Ambulatory Visit: Payer: Self-pay | Admitting: *Deleted

## 2014-11-23 DIAGNOSIS — N183 Chronic kidney disease, stage 3 unspecified: Secondary | ICD-10-CM

## 2014-11-23 MED ORDER — METOLAZONE 2.5 MG PO TABS: 2.5000 mg | ORAL_TABLET | Freq: Every day | ORAL | Status: DC | PRN

## 2014-11-26 ENCOUNTER — Ambulatory Visit (INDEPENDENT_AMBULATORY_CARE_PROVIDER_SITE_OTHER): Payer: Medicare Other | Admitting: Physical Therapy

## 2014-11-26 DIAGNOSIS — M25512 Pain in left shoulder: Secondary | ICD-10-CM

## 2014-11-26 DIAGNOSIS — R293 Abnormal posture: Secondary | ICD-10-CM

## 2014-11-26 DIAGNOSIS — N183 Chronic kidney disease, stage 3 (moderate): Secondary | ICD-10-CM | POA: Diagnosis not present

## 2014-11-26 DIAGNOSIS — M6281 Muscle weakness (generalized): Secondary | ICD-10-CM

## 2014-11-26 DIAGNOSIS — M25619 Stiffness of unspecified shoulder, not elsewhere classified: Secondary | ICD-10-CM

## 2014-11-27 LAB — BASIC METABOLIC PANEL WITH GFR
BUN: 69 mg/dL — AB (ref 6–23)
CALCIUM: 9.5 mg/dL (ref 8.4–10.5)
CO2: 27 meq/L (ref 19–32)
Chloride: 97 mEq/L (ref 96–112)
Creat: 2.65 mg/dL — ABNORMAL HIGH (ref 0.50–1.35)
GFR, EST AFRICAN AMERICAN: 27 mL/min — AB
GFR, Est Non African American: 24 mL/min — ABNORMAL LOW
Glucose, Bld: 244 mg/dL — ABNORMAL HIGH (ref 70–99)
POTASSIUM: 4.5 meq/L (ref 3.5–5.3)
SODIUM: 135 meq/L (ref 135–145)

## 2014-12-02 ENCOUNTER — Encounter: Payer: Self-pay | Admitting: Cardiology

## 2014-12-02 ENCOUNTER — Ambulatory Visit (INDEPENDENT_AMBULATORY_CARE_PROVIDER_SITE_OTHER): Payer: Medicare Other | Admitting: Cardiology

## 2014-12-02 VITALS — BP 126/68 | HR 74 | Ht 68.0 in | Wt 245.0 lb

## 2014-12-02 DIAGNOSIS — E785 Hyperlipidemia, unspecified: Secondary | ICD-10-CM

## 2014-12-02 DIAGNOSIS — E669 Obesity, unspecified: Secondary | ICD-10-CM

## 2014-12-02 DIAGNOSIS — N183 Chronic kidney disease, stage 3 unspecified: Secondary | ICD-10-CM

## 2014-12-02 DIAGNOSIS — I10 Essential (primary) hypertension: Secondary | ICD-10-CM

## 2014-12-02 DIAGNOSIS — I2510 Atherosclerotic heart disease of native coronary artery without angina pectoris: Secondary | ICD-10-CM

## 2014-12-02 DIAGNOSIS — I5031 Acute diastolic (congestive) heart failure: Secondary | ICD-10-CM | POA: Diagnosis not present

## 2014-12-02 NOTE — Assessment & Plan Note (Signed)
Patient is now euvolemic on examination.continue present dose of diuretics. He takes 80 mg twice daily with an additional 80 mg if he gains 2-3 pounds. We discussed low sodium diet and fluid restriction.

## 2014-12-02 NOTE — Progress Notes (Signed)
HPI: CAD and diastolic CHF. Myoview performed in April 2012 revealed apical ischemia and an ejection fraction of 56%. ABIs were normal. Patient had cardiac catheterization in April of 2012 that revealed 50% distal LM stenosis. LAD - mild diffuse 20% disease proximally. The LAD is occluded in the mid segment after the second diagonal and septal branch. It fills through the very faint collaterals and overall appears to be diffusely diseased. First diagonal is a small-sized branch with a 60% ostial stenosis. Second diagonal is a relatively large size branch with 40-50% proximal disease. The third diagonal appears to have 2 branches and subtotally occluded. LCX with diffuse 40% disease in the mid segment before the takeoff of OM-3. RCA with 30% proximal discrete stenosis. There is also 20% mid stenosis. There is a 30% distal stenosis before the bifurcation. Medical therapy recommended. Admitted in November 2015 with acute diastolic heart failure. Patient was diuresed. Echocardiogram November 2015 showed normal LV function, grade 2 diastolic dysfunction, moderate left atrial enlargement and mild mitral regurgitation. He developed significant renal insufficiency with diuresis. Since last seen, he denies dyspnea, chest pain, palpitations or syncopal.  Current Outpatient Prescriptions  Medication Sig Dispense Refill  . aspirin 81 MG tablet Take 81 mg by mouth daily.      Marland Kitchen atorvastatin (LIPITOR) 80 MG tablet TAKE 1 TABLET (80 MG TOTAL) BY MOUTH DAILY. 30 tablet 5  . BENICAR 40 MG tablet TAKE 1 TABLET EVERY DAY 30 tablet 5  . Cholecalciferol (VITAMIN D-3 PO) Take 2,000 Units by mouth daily.     . cloNIDine (CATAPRES) 0.2 MG tablet Take 1 tablet (0.2 mg total) by mouth 3 (three) times daily. 90 tablet 3  . furosemide (LASIX) 80 MG tablet Take 1 tablet by mouth 2 (two) times daily.     . hydrALAZINE (APRESOLINE) 25 MG tablet Take 1 tablet (25 mg total) by mouth 3 (three) times daily. 270 tablet 4  .  insulin aspart (NOVOLOG) 100 UNIT/ML FlexPen Inject into the skin 3 (three) times daily with meals. Sliding scale    . insulin detemir (LEVEMIR) 100 UNIT/ML injection Inject 110-120 Units into the skin at bedtime.     . isosorbide mononitrate (ISMO,MONOKET) 20 MG tablet Take 20 mg by mouth 3 (three) times daily.    . metoprolol succinate (TOPROL-XL) 100 MG 24 hr tablet Take 100 mg by mouth daily.  6  . nitroGLYCERIN (NITROSTAT) 0.4 MG SL tablet Place 1 tablet (0.4 mg total) under the tongue every 5 (five) minutes as needed for chest pain. 25 tablet 0   No current facility-administered medications for this visit.     Past Medical History  Diagnosis Date  . Diabetes mellitus   . Hypertension   . Hyperlipidemia   . OSA on CPAP   . Renal insufficiency   . CAD (coronary artery disease)   . Personal history of colonic polyps - adenomas 01/28/2014  . Heart failure, diastolic   . Shortness of breath dyspnea     Past Surgical History  Procedure Laterality Date  . Pilonidal cyst excision    . Carpal tunnel release      History   Social History  . Marital Status: Married    Spouse Name: N/A    Number of Children: 63  . Years of Education: N/A   Occupational History  . Warehouse    Social History Main Topics  . Smoking status: Former Smoker -- 1.00 packs/day for 20 years    Types:  Cigarettes    Quit date: 03/06/1977  . Smokeless tobacco: Never Used  . Alcohol Use: No  . Drug Use: No  . Sexual Activity: Not on file   Other Topics Concern  . Not on file   Social History Narrative    ROS: no fevers or chills, productive cough, hemoptysis, dysphasia, odynophagia, melena, hematochezia, dysuria, hematuria, rash, seizure activity, orthopnea, PND, pedal edema, claudication. Remaining systems are negative.  Physical Exam: Well-developed obese in no acute distress.  Skin is warm and dry.  HEENT is normal.  Neck is supple.  Chest is clear to auscultation with normal expansion.    Cardiovascular exam is regular rate and rhythm.  Abdominal exam nontender or distended. No masses palpated. Extremities show trace edema. neuro grossly intact

## 2014-12-02 NOTE — Patient Instructions (Signed)
Your physician recommends that you schedule a follow-up appointment in: 3 MONTHS WITH DR CRENSHAW  

## 2014-12-02 NOTE — Assessment & Plan Note (Signed)
We discussed weight loss.

## 2014-12-02 NOTE — Assessment & Plan Note (Signed)
Continue statin. 

## 2014-12-02 NOTE — Assessment & Plan Note (Signed)
Followed by nephrology. 

## 2014-12-02 NOTE — Assessment & Plan Note (Signed)
Continue aspirin and statin. 

## 2014-12-02 NOTE — Assessment & Plan Note (Signed)
Blood pressure controlled. Continue present medications. 

## 2014-12-03 ENCOUNTER — Encounter: Payer: Medicare Other | Admitting: Physical Therapy

## 2014-12-03 ENCOUNTER — Encounter: Payer: Self-pay | Admitting: Sports Medicine

## 2014-12-03 ENCOUNTER — Ambulatory Visit (INDEPENDENT_AMBULATORY_CARE_PROVIDER_SITE_OTHER): Payer: Medicare Other | Admitting: Sports Medicine

## 2014-12-03 VITALS — BP 145/64 | HR 64 | Wt 249.0 lb

## 2014-12-03 DIAGNOSIS — M19011 Primary osteoarthritis, right shoulder: Secondary | ICD-10-CM | POA: Insufficient documentation

## 2014-12-03 DIAGNOSIS — M19012 Primary osteoarthritis, left shoulder: Secondary | ICD-10-CM | POA: Diagnosis not present

## 2014-12-03 DIAGNOSIS — I2510 Atherosclerotic heart disease of native coronary artery without angina pectoris: Secondary | ICD-10-CM | POA: Diagnosis not present

## 2014-12-03 NOTE — Progress Notes (Signed)
  Subjective:    CC: Right shoulder pain  HPI: I saw Louis [name] not too long ago for severe left shoulder pain, we localized the pain to his acromioclavicular joint, and performed an injection, he did extremely well and is completely pain free with regards to his left shoulder, unfortunately he has started to have worsening pain over the right shoulder, in the exact same distribution, severe, persistent and localized over the acromioclavicular joint, worse with reaching across his chest. No radiation.  Past medical history, Surgical history, Family history not pertinant except as noted below, Social history, Allergies, and medications have been entered into the medical record, reviewed, and no changes needed.   Review of Systems: No fevers, chills, night sweats, weight loss, chest pain, or shortness of breath.   Objective:    General: Well Developed, well nourished, and in no acute distress.  Neuro: Alert and oriented x3, extra-ocular muscles intact, sensation grossly intact.  HEENT: Normocephalic, atraumatic, pupils equal round reactive to light, neck supple, no masses, no lymphadenopathy, thyroid nonpalpable.  Skin: Warm and dry, no rashes. Cardiac: Regular rate and rhythm, no murmurs rubs or gallops, no lower extremity edema.  Respiratory: Clear to auscultation bilaterally. Not using accessory muscles, speaking in full sentences. Right Shoulder: Inspection reveals no abnormalities, atrophy or asymmetry. Exquisite tenderness to palpation over the acromioclavicular joint ROM is full in all planes. Rotator cuff strength normal throughout. No signs of impingement with negative Neer and Hawkin's tests, empty can. Speeds and Yergason's tests normal. No labral pathology noted with negative Obrien's, negative crank, negative clunk, and good stability. Normal scapular function observed. No painful arc and no drop arm sign. No apprehension sign  Procedure: Real-time Ultrasound Guided Injection  of right acromioclavicular joint Device: GE Logiq E  Verbal informed consent obtained.  Time-out conducted.  Noted no overlying erythema, induration, or other signs of local infection.  Skin prepped in a sterile fashion.  Local anesthesia: Topical Ethyl chloride.  With sterile technique and under real time ultrasound guidance:  Noted acromioclavicular effusion, 25-gauge needle advanced into joint, 1 mL kenalog 40, 1 mL lidocaine injected easily. Completed without difficulty  Pain immediately resolved suggesting accurate placement of the medication.  Advised to call if fevers/chills, erythema, induration, drainage, or persistent bleeding.  Images permanently stored and available for review in the ultrasound unit.  Impression: Technically successful ultrasound guided injection.  Impression and Recommendations:

## 2014-12-03 NOTE — Assessment & Plan Note (Signed)
Excellent response to left acromial clavicular joint injection at the last visit, completely pain-free.  Repeat injection on the right side this time. Return in one month.

## 2014-12-08 DIAGNOSIS — E11321 Type 2 diabetes mellitus with mild nonproliferative diabetic retinopathy with macular edema: Secondary | ICD-10-CM | POA: Diagnosis not present

## 2014-12-08 DIAGNOSIS — Z87891 Personal history of nicotine dependence: Secondary | ICD-10-CM | POA: Diagnosis not present

## 2014-12-10 ENCOUNTER — Encounter: Payer: Medicare Other | Admitting: Physical Therapy

## 2014-12-15 DIAGNOSIS — M5126 Other intervertebral disc displacement, lumbar region: Secondary | ICD-10-CM | POA: Diagnosis not present

## 2014-12-15 DIAGNOSIS — M9903 Segmental and somatic dysfunction of lumbar region: Secondary | ICD-10-CM | POA: Diagnosis not present

## 2014-12-17 ENCOUNTER — Encounter: Payer: Medicare Other | Admitting: Physical Therapy

## 2014-12-18 ENCOUNTER — Ambulatory Visit: Payer: Medicare Other | Admitting: Sports Medicine

## 2014-12-19 DIAGNOSIS — M9903 Segmental and somatic dysfunction of lumbar region: Secondary | ICD-10-CM | POA: Diagnosis not present

## 2014-12-19 DIAGNOSIS — M5126 Other intervertebral disc displacement, lumbar region: Secondary | ICD-10-CM | POA: Diagnosis not present

## 2014-12-24 DIAGNOSIS — M5126 Other intervertebral disc displacement, lumbar region: Secondary | ICD-10-CM | POA: Diagnosis not present

## 2014-12-24 DIAGNOSIS — M9903 Segmental and somatic dysfunction of lumbar region: Secondary | ICD-10-CM | POA: Diagnosis not present

## 2014-12-31 ENCOUNTER — Encounter: Payer: Self-pay | Admitting: Sports Medicine

## 2014-12-31 ENCOUNTER — Ambulatory Visit (INDEPENDENT_AMBULATORY_CARE_PROVIDER_SITE_OTHER): Payer: Medicare Other | Admitting: Sports Medicine

## 2014-12-31 VITALS — BP 158/60 | HR 63 | Ht 68.0 in | Wt 249.0 lb

## 2014-12-31 DIAGNOSIS — I2510 Atherosclerotic heart disease of native coronary artery without angina pectoris: Secondary | ICD-10-CM | POA: Diagnosis not present

## 2014-12-31 DIAGNOSIS — M19011 Primary osteoarthritis, right shoulder: Secondary | ICD-10-CM | POA: Diagnosis not present

## 2014-12-31 DIAGNOSIS — M5126 Other intervertebral disc displacement, lumbar region: Secondary | ICD-10-CM | POA: Diagnosis not present

## 2014-12-31 DIAGNOSIS — M19012 Primary osteoarthritis, left shoulder: Secondary | ICD-10-CM

## 2014-12-31 DIAGNOSIS — M9903 Segmental and somatic dysfunction of lumbar region: Secondary | ICD-10-CM | POA: Diagnosis not present

## 2014-12-31 NOTE — Progress Notes (Signed)
  Subjective:    CC: Follow-up  HPI: Acromioclavicular osteoarthritis: Pain is now completely gone in both shoulders after bilateral acromioclavicular injections.  Past medical history, Surgical history, Family history not pertinant except as noted below, Social history, Allergies, and medications have been entered into the medical record, reviewed, and no changes needed.   Review of Systems: No fevers, chills, night sweats, weight loss, chest pain, or shortness of breath.   Objective:    General: Well Developed, well nourished, and in no acute distress.  Neuro: Alert and oriented x3, extra-ocular muscles intact, sensation grossly intact.  HEENT: Normocephalic, atraumatic, pupils equal round reactive to light, neck supple, no masses, no lymphadenopathy, thyroid nonpalpable.  Skin: Warm and dry, no rashes. Cardiac: Regular rate and rhythm, no murmurs rubs or gallops, no lower extremity edema.  Respiratory: Clear to auscultation bilaterally. Not using accessory muscles, speaking in full sentences. Bilateral Shoulder: Inspection reveals no abnormalities, atrophy or asymmetry. Palpation is normal with no tenderness over AC joint or bicipital groove. ROM is full in all planes. Rotator cuff strength normal throughout. No signs of impingement with negative Neer and Hawkin's tests, empty can. Speeds and Yergason's tests normal. No labral pathology noted with negative Obrien's, negative crank, negative clunk, and good stability. Normal scapular function observed. No painful arc and no drop arm sign. No apprehension sign  Impression and Recommendations:

## 2014-12-31 NOTE — Assessment & Plan Note (Signed)
Completely resolved after bilateral injections.

## 2015-01-07 DIAGNOSIS — M5126 Other intervertebral disc displacement, lumbar region: Secondary | ICD-10-CM | POA: Diagnosis not present

## 2015-01-07 DIAGNOSIS — M9903 Segmental and somatic dysfunction of lumbar region: Secondary | ICD-10-CM | POA: Diagnosis not present

## 2015-01-08 DIAGNOSIS — N183 Chronic kidney disease, stage 3 (moderate): Secondary | ICD-10-CM | POA: Diagnosis not present

## 2015-01-08 DIAGNOSIS — R809 Proteinuria, unspecified: Secondary | ICD-10-CM | POA: Diagnosis not present

## 2015-01-08 DIAGNOSIS — D631 Anemia in chronic kidney disease: Secondary | ICD-10-CM | POA: Diagnosis not present

## 2015-01-08 DIAGNOSIS — N184 Chronic kidney disease, stage 4 (severe): Secondary | ICD-10-CM | POA: Diagnosis not present

## 2015-01-08 DIAGNOSIS — I129 Hypertensive chronic kidney disease with stage 1 through stage 4 chronic kidney disease, or unspecified chronic kidney disease: Secondary | ICD-10-CM | POA: Diagnosis not present

## 2015-01-08 DIAGNOSIS — N2581 Secondary hyperparathyroidism of renal origin: Secondary | ICD-10-CM | POA: Diagnosis not present

## 2015-01-13 ENCOUNTER — Telehealth: Payer: Self-pay | Admitting: Cardiology

## 2015-01-13 NOTE — Telephone Encounter (Signed)
Received records from [facility] for appointment with Dr Stanford Breed on 03/03/15 at [facility] in [place].  Records given to [facility] (medical records) for Dr Jacalyn Lefevre schedule on 03/03/15.

## 2015-01-14 DIAGNOSIS — M5126 Other intervertebral disc displacement, lumbar region: Secondary | ICD-10-CM | POA: Diagnosis not present

## 2015-01-14 DIAGNOSIS — M9903 Segmental and somatic dysfunction of lumbar region: Secondary | ICD-10-CM | POA: Diagnosis not present

## 2015-01-21 DIAGNOSIS — M5126 Other intervertebral disc displacement, lumbar region: Secondary | ICD-10-CM | POA: Diagnosis not present

## 2015-01-21 DIAGNOSIS — M9903 Segmental and somatic dysfunction of lumbar region: Secondary | ICD-10-CM | POA: Diagnosis not present

## 2015-01-22 DIAGNOSIS — E11321 Type 2 diabetes mellitus with mild nonproliferative diabetic retinopathy with macular edema: Secondary | ICD-10-CM | POA: Diagnosis not present

## 2015-01-22 DIAGNOSIS — Z87891 Personal history of nicotine dependence: Secondary | ICD-10-CM | POA: Diagnosis not present

## 2015-01-27 ENCOUNTER — Other Ambulatory Visit: Payer: Self-pay

## 2015-01-27 MED ORDER — ATORVASTATIN CALCIUM 80 MG PO TABS: ORAL_TABLET | ORAL | Status: DC

## 2015-01-28 DIAGNOSIS — M9903 Segmental and somatic dysfunction of lumbar region: Secondary | ICD-10-CM | POA: Diagnosis not present

## 2015-01-28 DIAGNOSIS — M5126 Other intervertebral disc displacement, lumbar region: Secondary | ICD-10-CM | POA: Diagnosis not present

## 2015-02-02 ENCOUNTER — Encounter: Payer: Self-pay | Admitting: Family Medicine

## 2015-02-10 ENCOUNTER — Other Ambulatory Visit: Payer: Self-pay | Admitting: Physician Assistant

## 2015-02-10 NOTE — Telephone Encounter (Signed)
Review for refill. 

## 2015-02-11 DIAGNOSIS — E1142 Type 2 diabetes mellitus with diabetic polyneuropathy: Secondary | ICD-10-CM | POA: Diagnosis not present

## 2015-02-11 DIAGNOSIS — E11319 Type 2 diabetes mellitus with unspecified diabetic retinopathy without macular edema: Secondary | ICD-10-CM | POA: Diagnosis not present

## 2015-02-11 DIAGNOSIS — E1122 Type 2 diabetes mellitus with diabetic chronic kidney disease: Secondary | ICD-10-CM | POA: Diagnosis not present

## 2015-02-11 DIAGNOSIS — M9903 Segmental and somatic dysfunction of lumbar region: Secondary | ICD-10-CM | POA: Diagnosis not present

## 2015-02-11 DIAGNOSIS — M5126 Other intervertebral disc displacement, lumbar region: Secondary | ICD-10-CM | POA: Diagnosis not present

## 2015-02-11 DIAGNOSIS — N183 Chronic kidney disease, stage 3 (moderate): Secondary | ICD-10-CM | POA: Diagnosis not present

## 2015-02-11 LAB — LIPID PANEL
Cholesterol: 129 mg/dL (ref 0–200)
HDL: 32 mg/dL — AB (ref 35–70)
LDL CALC: 68 mg/dL
Triglycerides: 145 mg/dL (ref 40–160)

## 2015-02-11 LAB — HEMOGLOBIN A1C: HEMOGLOBIN A1C: 6.6

## 2015-02-15 ENCOUNTER — Other Ambulatory Visit: Payer: Self-pay | Admitting: Family Medicine

## 2015-02-17 ENCOUNTER — Other Ambulatory Visit: Payer: Self-pay | Admitting: Cardiology

## 2015-02-19 DIAGNOSIS — E11321 Type 2 diabetes mellitus with mild nonproliferative diabetic retinopathy with macular edema: Secondary | ICD-10-CM | POA: Diagnosis not present

## 2015-03-02 ENCOUNTER — Encounter: Payer: Self-pay | Admitting: Sports Medicine

## 2015-03-02 ENCOUNTER — Ambulatory Visit (INDEPENDENT_AMBULATORY_CARE_PROVIDER_SITE_OTHER): Payer: Medicare Other | Admitting: Sports Medicine

## 2015-03-02 VITALS — BP 137/64 | HR 60 | Wt 244.0 lb

## 2015-03-02 DIAGNOSIS — M19012 Primary osteoarthritis, left shoulder: Secondary | ICD-10-CM | POA: Diagnosis not present

## 2015-03-02 DIAGNOSIS — M19011 Primary osteoarthritis, right shoulder: Secondary | ICD-10-CM | POA: Diagnosis not present

## 2015-03-02 NOTE — Progress Notes (Signed)
  Subjective:    CC:  Shoulder pain  HPI: Bilateral acromioclavicular osteoarthritis: previous injection, bilateral was in December 2015, we injected his right side again in January of this year, he had 3 months of response and now has a recurrence of pain. Severe, persistent, localized over the acromioclavicular joint bilaterally. Radiates into the neck.  Past medical history, Surgical history, Family history not pertinant except as noted below, Social history, Allergies, and medications have been entered into the medical record, reviewed, and no changes needed.   Review of Systems: No fevers, chills, night sweats, weight loss, chest pain, or shortness of breath.   Objective:    General: Well Developed, well nourished, and in no acute distress.  Neuro: Alert and oriented x3, extra-ocular muscles intact, sensation grossly intact.  HEENT: Normocephalic, atraumatic, pupils equal round reactive to light, neck supple, no masses, no lymphadenopathy, thyroid nonpalpable.  Skin: Warm and dry, no rashes. Cardiac: Regular rate and rhythm, no murmurs rubs or gallops, no lower extremity edema.  Respiratory: Clear to auscultation bilaterally. Not using accessory muscles, speaking in full sentences.  Procedure: Real-time Ultrasound Guided Injection of right acromioclavicular joint Device: GE Logiq E  Verbal informed consent obtained.  Time-out conducted.  Noted no overlying erythema, induration, or other signs of local infection.  Skin prepped in a sterile fashion.  Local anesthesia: Topical Ethyl chloride.  With sterile technique and under real time ultrasound guidance: 1 mL kenalog 40, 1 mL lidocaine injected easily.  Completed without difficulty  Pain immediately resolved suggesting accurate placement of the medication.  Advised to call if fevers/chills, erythema, induration, drainage, or persistent bleeding.  Images permanently stored and available for review in the ultrasound unit.    Impression: Technically successful ultrasound guided injection.   Procedure: Real-time Ultrasound Guided Injection of left acromioclavicular joint Device: GE Logiq E  Verbal informed consent obtained.  Time-out conducted.  Noted no overlying erythema, induration, or other signs of local infection.  Skin prepped in a sterile fashion.  Local anesthesia: Topical Ethyl chloride.  With sterile technique and under real time ultrasound guidance: 1 mL kenalog 40, 1 mL lidocaine injected easily.  Completed without difficulty  Pain immediately resolved suggesting accurate placement of the medication.  Advised to call if fevers/chills, erythema, induration, drainage, or persistent bleeding.  Images permanently stored and available for review in the ultrasound unit.  Impression: Technically successful ultrasound guided injection.  Impression and Recommendations:

## 2015-03-02 NOTE — Assessment & Plan Note (Signed)
Three-month response to injections, repeat today. If pain recurs within 3 months he would be a candidate for distal clavicular excision.

## 2015-03-03 ENCOUNTER — Encounter: Payer: Self-pay | Admitting: Cardiology

## 2015-03-03 ENCOUNTER — Ambulatory Visit (INDEPENDENT_AMBULATORY_CARE_PROVIDER_SITE_OTHER): Payer: Medicare Other | Admitting: Cardiology

## 2015-03-03 ENCOUNTER — Ambulatory Visit: Payer: Medicare Other | Admitting: Sports Medicine

## 2015-03-03 VITALS — BP 156/70 | HR 68 | Ht 68.0 in | Wt 245.1 lb

## 2015-03-03 DIAGNOSIS — R001 Bradycardia, unspecified: Secondary | ICD-10-CM | POA: Diagnosis not present

## 2015-03-03 DIAGNOSIS — N183 Chronic kidney disease, stage 3 unspecified: Secondary | ICD-10-CM

## 2015-03-03 DIAGNOSIS — E785 Hyperlipidemia, unspecified: Secondary | ICD-10-CM

## 2015-03-03 DIAGNOSIS — I2510 Atherosclerotic heart disease of native coronary artery without angina pectoris: Secondary | ICD-10-CM | POA: Diagnosis not present

## 2015-03-03 DIAGNOSIS — I5031 Acute diastolic (congestive) heart failure: Secondary | ICD-10-CM

## 2015-03-03 DIAGNOSIS — I10 Essential (primary) hypertension: Secondary | ICD-10-CM | POA: Diagnosis not present

## 2015-03-03 MED ORDER — HYDRALAZINE HCL 50 MG PO TABS: 50.0000 mg | ORAL_TABLET | Freq: Three times a day (TID) | ORAL | Status: DC

## 2015-03-03 NOTE — Assessment & Plan Note (Signed)
Patient euvolemic on examination. Continue present dose of diuretics.

## 2015-03-03 NOTE — Assessment & Plan Note (Signed)
Continue aspirin and statin. 

## 2015-03-03 NOTE — Patient Instructions (Signed)
Your physician wants you to follow-up in: [place] will receive a reminder letter in the mail two months in advance. If you don't receive a letter, please call our office to schedule the follow-up appointment.   INCREASE HYDRALAZINE TO 50 MG THREE TIMES DAILY= 2 OF THE 25 MG TABLETS THREE TIMES DAILY

## 2015-03-03 NOTE — Assessment & Plan Note (Signed)
Continue statin. 

## 2015-03-03 NOTE — Assessment & Plan Note (Signed)
Blood pressure elevated. Increase hydralazine to 50 mg by mouth 3 times a day.

## 2015-03-03 NOTE — Progress Notes (Signed)
HPI: FU CAD and diastolic CHF. Myoview performed in April 2012 revealed apical ischemia and an ejection fraction of 56%. ABIs were normal. Patient had cardiac catheterization in April of 2012 that revealed 50% distal LM stenosis. LAD - mild diffuse 20% disease proximally. The LAD is occluded in the mid segment after the second diagonal and septal branch. It fills through the very faint collaterals and overall appears to be diffusely diseased. First diagonal is a small-sized branch with a 60% ostial stenosis. Second diagonal is a relatively large size branch with 40-50% proximal disease. The third diagonal appears to have 2 branches and subtotally occluded. LCX with diffuse 40% disease in the mid segment before the takeoff of OM-3. RCA with 30% proximal discrete stenosis. There is also 20% mid stenosis. There is a 30% distal stenosis before the bifurcation. Medical therapy recommended. Admitted in November 2015 with acute diastolic heart failure. Patient was diuresed. Echocardiogram November 2015 showed normal LV function, grade 2 diastolic dysfunction, moderate left atrial enlargement and mild mitral regurgitation. He developed significant renal insufficiency with diuresis. Since last seen, he denies dyspnea, chest pain, palpitations or syncopal.  Current Outpatient Prescriptions  Medication Sig Dispense Refill  . aspirin 81 MG tablet Take 81 mg by mouth daily.      Marland Kitchen atorvastatin (LIPITOR) 80 MG tablet TAKE 1 TABLET (80 MG TOTAL) BY MOUTH DAILY. 90 tablet 1  . BENICAR 40 MG tablet TAKE 1 TABLET EVERY DAY 30 tablet 5  . Cholecalciferol (VITAMIN D-3 PO) Take 2,000 Units by mouth daily.     . cloNIDine (CATAPRES) 0.2 MG tablet TAKE 1 TABLET (0.2 MG TOTAL) BY MOUTH 3 (THREE) TIMES DAILY. 90 tablet 3  . furosemide (LASIX) 80 MG tablet Take 1 tablet by mouth 2 (two) times daily.     . hydrALAZINE (APRESOLINE) 50 MG tablet Take 1 tablet (50 mg total) by mouth 3 (three) times daily. 90 tablet 12  .  insulin aspart (NOVOLOG) 100 UNIT/ML FlexPen Inject into the skin 3 (three) times daily with meals. Sliding scale    . insulin detemir (LEVEMIR) 100 UNIT/ML injection Inject 110-120 Units into the skin at bedtime.     . isosorbide dinitrate (ISORDIL) 20 MG tablet TAKE 1 TABLET (20 MG TOTAL) BY MOUTH 3 (THREE) TIMES DAILY. 90 tablet 3  . isosorbide mononitrate (ISMO,MONOKET) 20 MG tablet Take 20 mg by mouth 3 (three) times daily.    . metoprolol succinate (TOPROL-XL) 100 MG 24 hr tablet Take 100 mg by mouth daily.  6  . nitroGLYCERIN (NITROSTAT) 0.4 MG SL tablet Place 1 tablet (0.4 mg total) under the tongue every 5 (five) minutes as needed for chest pain. 25 tablet 0   No current facility-administered medications for this visit.     Past Medical History  Diagnosis Date  . Diabetes mellitus   . Hypertension   . Hyperlipidemia   . OSA on CPAP   . Renal insufficiency   . CAD (coronary artery disease)   . Personal history of colonic polyps - adenomas 01/28/2014  . Heart failure, diastolic   . Shortness of breath dyspnea     Past Surgical History  Procedure Laterality Date  . Pilonidal cyst excision    . Carpal tunnel release      History   Social History  . Marital Status: Married    Spouse Name: N/A  . Number of Children: 5  . Years of Education: N/A   Occupational History  . Warehouse  Social History Main Topics  . Smoking status: Former Smoker -- 1.00 packs/day for 20 years    Types: Cigarettes    Quit date: 03/06/1977  . Smokeless tobacco: Never Used  . Alcohol Use: No  . Drug Use: No  . Sexual Activity: Not on file   Other Topics Concern  . Not on file   Social History Narrative    ROS: no fevers or chills, productive cough, hemoptysis, dysphasia, odynophagia, melena, hematochezia, dysuria, hematuria, rash, seizure activity, orthopnea, PND, pedal edema, claudication. Remaining systems are negative.  Physical Exam: Well-developed obese in no acute distress.   Skin is warm and dry.  HEENT is normal.  Neck is supple.  Chest is clear to auscultation with normal expansion.  Cardiovascular exam is regular rate and rhythm.  Abdominal exam nontender or distended. No masses palpated. Extremities show no edema. neuro grossly intact  ECG sinus rhythm at a rate of 68. Inferior lateral T-wave inversion. Left ventricular hypertrophy.

## 2015-03-03 NOTE — Assessment & Plan Note (Signed)
Followed by nephrology. 

## 2015-03-11 ENCOUNTER — Telehealth: Payer: Self-pay | Admitting: Cardiology

## 2015-03-11 DIAGNOSIS — I2510 Atherosclerotic heart disease of native coronary artery without angina pectoris: Secondary | ICD-10-CM

## 2015-03-11 DIAGNOSIS — I129 Hypertensive chronic kidney disease with stage 1 through stage 4 chronic kidney disease, or unspecified chronic kidney disease: Secondary | ICD-10-CM | POA: Diagnosis not present

## 2015-03-11 DIAGNOSIS — D631 Anemia in chronic kidney disease: Secondary | ICD-10-CM | POA: Diagnosis not present

## 2015-03-11 DIAGNOSIS — R809 Proteinuria, unspecified: Secondary | ICD-10-CM | POA: Diagnosis not present

## 2015-03-11 DIAGNOSIS — N183 Chronic kidney disease, stage 3 (moderate): Secondary | ICD-10-CM | POA: Diagnosis not present

## 2015-03-11 DIAGNOSIS — N184 Chronic kidney disease, stage 4 (severe): Secondary | ICD-10-CM | POA: Diagnosis not present

## 2015-03-11 DIAGNOSIS — E785 Hyperlipidemia, unspecified: Secondary | ICD-10-CM

## 2015-03-11 DIAGNOSIS — R001 Bradycardia, unspecified: Secondary | ICD-10-CM

## 2015-03-11 DIAGNOSIS — I10 Essential (primary) hypertension: Secondary | ICD-10-CM

## 2015-03-11 NOTE — Telephone Encounter (Signed)
Pt 's wife called in stating that the dosage for the pt's Hydralazine is 100mg  tid per Dr. Lucianne Lei. She asked that you call back if there were any questions.   Thanks

## 2015-03-11 NOTE — Telephone Encounter (Signed)
Patient medication list has been updated

## 2015-03-23 DIAGNOSIS — E11311 Type 2 diabetes mellitus with unspecified diabetic retinopathy with macular edema: Secondary | ICD-10-CM | POA: Diagnosis not present

## 2015-03-23 DIAGNOSIS — M5126 Other intervertebral disc displacement, lumbar region: Secondary | ICD-10-CM | POA: Diagnosis not present

## 2015-03-23 DIAGNOSIS — E11321 Type 2 diabetes mellitus with mild nonproliferative diabetic retinopathy with macular edema: Secondary | ICD-10-CM | POA: Diagnosis not present

## 2015-03-23 DIAGNOSIS — E11329 Type 2 diabetes mellitus with mild nonproliferative diabetic retinopathy without macular edema: Secondary | ICD-10-CM | POA: Diagnosis not present

## 2015-03-23 DIAGNOSIS — M9903 Segmental and somatic dysfunction of lumbar region: Secondary | ICD-10-CM | POA: Diagnosis not present

## 2015-04-20 ENCOUNTER — Ambulatory Visit (INDEPENDENT_AMBULATORY_CARE_PROVIDER_SITE_OTHER): Payer: Medicare Other | Admitting: Sports Medicine

## 2015-04-20 ENCOUNTER — Encounter: Payer: Self-pay | Admitting: Sports Medicine

## 2015-04-20 ENCOUNTER — Ambulatory Visit (INDEPENDENT_AMBULATORY_CARE_PROVIDER_SITE_OTHER): Payer: Medicare Other

## 2015-04-20 VITALS — BP 139/64 | HR 66 | Ht 65.0 in | Wt 246.0 lb

## 2015-04-20 DIAGNOSIS — M7989 Other specified soft tissue disorders: Secondary | ICD-10-CM | POA: Diagnosis not present

## 2015-04-20 DIAGNOSIS — M25432 Effusion, left wrist: Secondary | ICD-10-CM

## 2015-04-20 DIAGNOSIS — M10031 Idiopathic gout, right wrist: Secondary | ICD-10-CM

## 2015-04-20 DIAGNOSIS — M109 Gout, unspecified: Secondary | ICD-10-CM | POA: Insufficient documentation

## 2015-04-20 DIAGNOSIS — M25532 Pain in left wrist: Secondary | ICD-10-CM | POA: Diagnosis not present

## 2015-04-20 DIAGNOSIS — M85832 Other specified disorders of bone density and structure, left forearm: Secondary | ICD-10-CM | POA: Diagnosis not present

## 2015-04-20 MED ORDER — PREDNISONE 50 MG PO TABS: ORAL_TABLET | ORAL | Status: DC

## 2015-04-20 NOTE — Progress Notes (Signed)
  Subjective:    CC: left wrist swelling  HPI: For the past 2 days this pleasant 69 year old male has noted increasing swelling and pain that occurred rapidly without trauma in his left wrist. It is warm, red, no fevers or chills, no night sweats. Pain is severe, persistent. He did have an increasing amount of meat over the weekends but no alcohol. No history of gout. I also don't see if we ever checked his uric acid levels.  Past medical history, Surgical history, Family history not pertinant except as noted below, Social history, Allergies, and medications have been entered into the medical record, reviewed, and no changes needed.   Review of Systems: No fevers, chills, night sweats, weight loss, chest pain, or shortness of breath.   Objective:    General: Well Developed, well nourished, and in no acute distress.  Neuro: Alert and oriented x3, extra-ocular muscles intact, sensation grossly intact.  HEENT: Normocephalic, atraumatic, pupils equal round reactive to light, neck supple, no masses, no lymphadenopathy, thyroid nonpalpable.  Skin: Warm and dry, no rashes. Cardiac: Regular rate and rhythm, no murmurs rubs or gallops, no lower extremity edema.  Respiratory: Clear to auscultation bilaterally. Not using accessory muscles, speaking in full sentences. Left Wrist: Visibly swollen, warm, slightly erythematous with tenderness over the dorsum and the volar aspect of the radiocarpal joint. Range of motion severely limited by pain.  Procedure: Real-time Ultrasound Guided aspiration/Injection of left wrist joint Device: GE Logiq E  Verbal informed consent obtained.  Time-out conducted.  Noted no overlying erythema, induration, or other signs of local infection.  Skin prepped in a sterile fashion.  Local anesthesia: Topical Ethyl chloride.  With sterile technique and under real time ultrasound guidance:  22-gauge needle advanced into the mid carpal joint, I was able to aspirate  approximately 1/2 mL of cloudy, straw-colored fluid, syringe switched and a total of 1 mL kenalog 40, 1 mL lidocaine spread out between the metacarpal joint as well as the radiocarpal joint, I was also able to inject a small amount of medication into the fourth extensor compartment. Completed without difficulty  Pain immediately resolved suggesting accurate placement of the medication.  Advised to call if fevers/chills, erythema, induration, drainage, or persistent bleeding.  Images permanently stored and available for review in the ultrasound unit.  Impression: Technically successful ultrasound guided injection.  I then strapped the left wrist with compressive dressing.  With the exception of soft tissue swelling I do not see any abnormalities on the wrist x-ray with the exception of calcification of some of the volar arteries.  Impression and Recommendations:    I spent 40 minutes with this patient, greater than 50% was face-to-face time counseling regarding the above diagnoses

## 2015-04-20 NOTE — Assessment & Plan Note (Addendum)
With moderate to severe synovitis and wrist joint effusion. I was able to aspirate approximately 1 mL of synovial fluid, I then injected medication both into the midcarpal joints as well as the radiocarpal joint. Fluid will be sent off for crystal analysis. I would also like patient to go downstairs for serum uric acid levels. 5 days of prednisone. X-rays. Wrist was strapped with compressive dressing. We did have to do a ring removal procedure. Return to see me in 2 weeks.  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 Recheck uric acid levels in one month.

## 2015-04-21 ENCOUNTER — Telehealth: Payer: Self-pay

## 2015-04-21 LAB — URIC ACID: Uric Acid, Serum: 13.3 mg/dL — ABNORMAL HIGH (ref 4.0–7.8)

## 2015-04-21 LAB — CBC WITH DIFFERENTIAL/PLATELET
Basophils Absolute: 0 10*3/uL (ref 0.0–0.1)
Basophils Relative: 0 % (ref 0–1)
Eosinophils Absolute: 0.2 K/uL (ref 0.0–0.7)
Eosinophils Relative: 2 % (ref 0–5)
HCT: 37.6 % — ABNORMAL LOW (ref 39.0–52.0)
Hemoglobin: 12.5 g/dL — ABNORMAL LOW (ref 13.0–17.0)
Lymphocytes Relative: 13 % (ref 12–46)
Lymphs Abs: 1.1 10*3/uL (ref 0.7–4.0)
MCH: 27.6 pg (ref 26.0–34.0)
MCHC: 33.2 g/dL (ref 30.0–36.0)
MCV: 83 fL (ref 78.0–100.0)
MPV: 9.9 fL (ref 8.6–12.4)
Monocytes Absolute: 0.7 K/uL (ref 0.1–1.0)
Monocytes Relative: 9 % (ref 3–12)
Neutro Abs: 6.3 K/uL (ref 1.7–7.7)
Neutrophils Relative %: 76 % (ref 43–77)
Platelets: 306 10*3/uL (ref 150–400)
RBC: 4.53 MIL/uL (ref 4.22–5.81)
RDW: 15.9 % — ABNORMAL HIGH (ref 11.5–15.5)
WBC: 8.3 10*3/uL (ref 4.0–10.5)

## 2015-04-21 LAB — COMPREHENSIVE METABOLIC PANEL
ALT: 11 U/L (ref 0–53)
AST: 15 U/L (ref 0–37)
Albumin: 3.9 g/dL (ref 3.5–5.2)
Alkaline Phosphatase: 88 U/L (ref 39–117)
BUN: 42 mg/dL — ABNORMAL HIGH (ref 6–23)
Calcium: 9.2 mg/dL (ref 8.4–10.5)
Creat: 2.2 mg/dL — ABNORMAL HIGH (ref 0.50–1.35)
Potassium: 3.6 mEq/L (ref 3.5–5.3)
Sodium: 137 mEq/L (ref 135–145)
Total Bilirubin: 0.6 mg/dL (ref 0.2–1.2)

## 2015-04-21 LAB — SYNOVIAL CELL COUNT + DIFF, W/ CRYSTALS
Eosinophils-Synovial: 0 % (ref 0–1)
Lymphocytes-Synovial Fld: 21 % — ABNORMAL HIGH (ref 0–20)
Monocyte/Macrophage: 21 % — ABNORMAL LOW (ref 50–90)
Neutrophil, Synovial: 58 % — ABNORMAL HIGH (ref 0–25)
WBC, Synovial: 5250 uL — ABNORMAL HIGH (ref 0–200)

## 2015-04-21 LAB — SEDIMENTATION RATE: Sed Rate: 71 mm/h — ABNORMAL HIGH (ref 0–20)

## 2015-04-21 LAB — COMPREHENSIVE METABOLIC PANEL WITH GFR
CO2: 29 meq/L (ref 19–32)
Chloride: 97 meq/L (ref 96–112)
Glucose, Bld: 61 mg/dL — ABNORMAL LOW (ref 70–99)
Total Protein: 7 g/dL (ref 6.0–8.3)

## 2015-04-21 MED ORDER — ALLOPURINOL 100 MG PO TABS: 100.0000 mg | ORAL_TABLET | Freq: Two times a day (BID) | ORAL | Status: DC

## 2015-04-21 NOTE — Telephone Encounter (Signed)
Noted  

## 2015-04-21 NOTE — Addendum Note (Signed)
Addended by: Silverio Decamp on: 04/21/2015 03:11 PM   Modules accepted: Orders

## 2015-04-21 NOTE — Telephone Encounter (Signed)
Anderson Malta from [place] [facility] called stated that patient uric acid level is 13.3 she stated that the labs were ran twice and verified twice. Dreon Pineda,CMA

## 2015-04-22 ENCOUNTER — Telehealth: Payer: Self-pay | Admitting: *Deleted

## 2015-04-22 DIAGNOSIS — M10031 Idiopathic gout, right wrist: Secondary | ICD-10-CM

## 2015-04-22 MED ORDER — COLCHICINE 0.6 MG PO CAPS: 1.0000 | ORAL_CAPSULE | Freq: Every day | ORAL | Status: DC

## 2015-04-22 NOTE — Telephone Encounter (Signed)
[name] left vm late yesterday afternoon stating that they were in contact with the pt's kidney dr yesterday and he (Dr.) wanted to know if you could rx colcrys for Rontavious instead of the prednisone.  Please advise.

## 2015-04-22 NOTE — Telephone Encounter (Signed)
Colcrys is now too expensive most likely, I am going to use mitigare.

## 2015-04-24 LAB — BODY FLUID CULTURE
Gram Stain: NONE SEEN
Organism ID, Bacteria: NO GROWTH

## 2015-04-27 ENCOUNTER — Encounter: Payer: Self-pay | Admitting: Family Medicine

## 2015-04-27 ENCOUNTER — Other Ambulatory Visit: Payer: Self-pay | Admitting: Family Medicine

## 2015-04-27 DIAGNOSIS — Z23 Encounter for immunization: Secondary | ICD-10-CM

## 2015-04-27 NOTE — Progress Notes (Unsigned)
Pt received an Prevnar 13 injection today during his wife's office visit.

## 2015-04-29 DIAGNOSIS — E11311 Type 2 diabetes mellitus with unspecified diabetic retinopathy with macular edema: Secondary | ICD-10-CM | POA: Diagnosis not present

## 2015-04-29 DIAGNOSIS — E11329 Type 2 diabetes mellitus with mild nonproliferative diabetic retinopathy without macular edema: Secondary | ICD-10-CM | POA: Diagnosis not present

## 2015-05-04 ENCOUNTER — Ambulatory Visit (INDEPENDENT_AMBULATORY_CARE_PROVIDER_SITE_OTHER): Payer: Medicare Other | Admitting: Sports Medicine

## 2015-05-04 ENCOUNTER — Encounter: Payer: Self-pay | Admitting: Sports Medicine

## 2015-05-04 VITALS — BP 136/63 | HR 67 | Wt 248.0 lb

## 2015-05-04 DIAGNOSIS — I2510 Atherosclerotic heart disease of native coronary artery without angina pectoris: Secondary | ICD-10-CM | POA: Diagnosis not present

## 2015-05-04 DIAGNOSIS — M10031 Idiopathic gout, right wrist: Secondary | ICD-10-CM

## 2015-05-04 MED ORDER — COLCHICINE 0.6 MG PO TABS: ORAL_TABLET | ORAL | Status: DC

## 2015-05-04 NOTE — Assessment & Plan Note (Addendum)
Clinical diagnosis of gout is made based on elevated serum uric acid levels and monosodium uric acid crystals in arthrocentesis and polarized microscopy. Uric acid level was 13. Continue allopurinol, rechecking uric acid in 2 weeks. Adding colchicine tablets, switching from capsules for flares. If these prove to be too expensive, and since we cannot use indomethacin due to chronic renal insufficiency, I can certainly simply perform injections for monoarticular gout flares. Ultimately we would like to dose his allopurinol to keep uric acid levels below 5.

## 2015-05-04 NOTE — Progress Notes (Signed)
  Subjective:    CC: Follow-up  HPI: This is a pleasant 69 year old male with renal insufficiency, he came in at a previous visit with severe left wrist swelling, I was able to aspirate some articular fluid which ended up positive for monosodium urate crystals, subsequently his serum uric acid levels were over 13. I injected the joint, and his pain and severe swelling resolved immediately. He returns today extremely happy with how things of gone, he is taking his allopurinol. Colchicine was too expensive.  Past medical history, Surgical history, Family history not pertinant except as noted below, Social history, Allergies, and medications have been entered into the medical record, reviewed, and no changes needed.   Review of Systems: No fevers, chills, night sweats, weight loss, chest pain, or shortness of breath.   Objective:    General: Well Developed, well nourished, and in no acute distress.  Neuro: Alert and oriented x3, extra-ocular muscles intact, sensation grossly intact.  HEENT: Normocephalic, atraumatic, pupils equal round reactive to light, neck supple, no masses, no lymphadenopathy, thyroid nonpalpable.  Skin: Warm and dry, no rashes. Cardiac: Regular rate and rhythm, no murmurs rubs or gallops, no lower extremity edema.  Respiratory: Clear to auscultation bilaterally. Not using accessory muscles, speaking in full sentences. Left Wrist: Inspection normal with no visible erythema or swelling. ROM smooth and normal with good flexion and extension and ulnar/radial deviation that is symmetrical with opposite wrist. Palpation is normal over metacarpals, navicular, lunate, and TFCC; tendons without tenderness/ swelling No snuffbox tenderness. No tenderness over Canal of Guyon. Strength 5/5 in all directions without pain. Negative Finkelstein, tinel's and phalens. Negative Watson's test.  Impression and Recommendations:

## 2015-05-04 NOTE — Patient Instructions (Signed)

## 2015-05-13 ENCOUNTER — Encounter: Payer: Self-pay | Admitting: Sports Medicine

## 2015-05-13 ENCOUNTER — Ambulatory Visit (INDEPENDENT_AMBULATORY_CARE_PROVIDER_SITE_OTHER): Payer: Medicare Other | Admitting: Sports Medicine

## 2015-05-13 DIAGNOSIS — M1000 Idiopathic gout, unspecified site: Secondary | ICD-10-CM | POA: Diagnosis not present

## 2015-05-13 DIAGNOSIS — M109 Gout, unspecified: Secondary | ICD-10-CM | POA: Diagnosis not present

## 2015-05-13 NOTE — Assessment & Plan Note (Signed)
Currently on low-dose allopurinol considering renal insufficiency. Has not yet checked uric acid levels, that will be next week. Currently having a flare, monoarticular in his right elbow, aspiration and injection as above. Crystal now cyst. Return in one month. Unable to obtain colchicine.

## 2015-05-13 NOTE — Progress Notes (Signed)
  Subjective:    CC:   Right elbow pain  HPI:  Louis [name] returns, he has gout diagnosis is visit, we aspirated and injected his wrist, found uric acid crystals, as well as a very high serum level of uric acid, injection resolved all his wrist pain, restarted allopurinol. He is unable to afford colchicine, and we must avoid indomethacin due to his renal insufficiency. Unfortunately over the past several days he is noted increasing pain and swelling in his right elbow, localized at the joint line with greater decreases in his range of motion. He did spend a good amount of time working in the yard recently. Symptoms are severe, persistent.  Past medical history, Surgical history, Family history not pertinant except as noted below, Social history, Allergies, and medications have been entered into the medical record, reviewed, and no changes needed.   Review of Systems: No fevers, chills, night sweats, weight loss, chest pain, or shortness of breath.   Objective:    General: Well Developed, well nourished, and in no acute distress.  Neuro: Alert and oriented x3, extra-ocular muscles intact, sensation grossly intact.  HEENT: Normocephalic, atraumatic, pupils equal round reactive to light, neck supple, no masses, no lymphadenopathy, thyroid nonpalpable.  Skin: Warm and dry, no rashes. Cardiac: Regular rate and rhythm, no murmurs rubs or gallops, no lower extremity edema.  Respiratory: Clear to auscultation bilaterally. Not using accessory muscles, speaking in full sentences. Right Elbow: Visibly swollen, significant loss of range of motion with about 20 of flexion lag and 10 of extension lag Strength is full to all of the above directions Stable to varus, valgus stress. Negative moving valgus stress test. No discrete areas of tenderness to palpation. Ulnar nerve does not sublux. Negative cubital tunnel Tinel's.  Procedure: Real-time Ultrasound Guided aspiration/Injection of right elbow  joint Device: GE Logiq E  Verbal informed consent obtained.  Time-out conducted.  Noted no overlying erythema, induration, or other signs of local infection.  Skin prepped in a sterile fashion.  Local anesthesia: Topical Ethyl chloride.  With sterile technique and under real time ultrasound guidance:  Noted distended joint capsule, 10 mL of straw-colored fluid aspirated, syringe switched and 1 mL kenalog 40, 2 mL lidocaine injected easily. Completed without difficulty  Pain immediately resolved suggesting accurate placement of the medication.  Advised to call if fevers/chills, erythema, induration, drainage, or persistent bleeding.  Images permanently stored and available for review in the ultrasound unit.  Impression: Technically successful ultrasound guided injection.  Impression and Recommendations:

## 2015-05-14 DIAGNOSIS — D631 Anemia in chronic kidney disease: Secondary | ICD-10-CM | POA: Diagnosis not present

## 2015-05-14 DIAGNOSIS — E0822 Diabetes mellitus due to underlying condition with diabetic chronic kidney disease: Secondary | ICD-10-CM | POA: Diagnosis not present

## 2015-05-14 DIAGNOSIS — N184 Chronic kidney disease, stage 4 (severe): Secondary | ICD-10-CM | POA: Diagnosis not present

## 2015-05-14 DIAGNOSIS — I129 Hypertensive chronic kidney disease with stage 1 through stage 4 chronic kidney disease, or unspecified chronic kidney disease: Secondary | ICD-10-CM | POA: Diagnosis not present

## 2015-05-14 LAB — SYNOVIAL CELL COUNT + DIFF, W/ CRYSTALS
Crystals, Fluid: NONE SEEN
Eosinophils-Synovial: 0 % (ref 0–1)
Lymphocytes-Synovial Fld: 39 % — ABNORMAL HIGH (ref 0–20)
Monocyte/Macrophage: 13 % — ABNORMAL LOW (ref 50–90)
Neutrophil, Synovial: 48 % — ABNORMAL HIGH (ref 0–25)
WBC, Synovial: 445 uL — ABNORMAL HIGH (ref 0–200)

## 2015-05-17 ENCOUNTER — Other Ambulatory Visit: Payer: Self-pay

## 2015-05-18 ENCOUNTER — Other Ambulatory Visit: Payer: Self-pay

## 2015-05-18 DIAGNOSIS — M10031 Idiopathic gout, right wrist: Secondary | ICD-10-CM | POA: Diagnosis not present

## 2015-05-18 DIAGNOSIS — M25432 Effusion, left wrist: Secondary | ICD-10-CM

## 2015-05-19 LAB — URIC ACID: Uric Acid, Serum: 7.9 mg/dL — ABNORMAL HIGH (ref 4.0–7.8)

## 2015-05-19 MED ORDER — ALLOPURINOL 300 MG PO TABS: 300.0000 mg | ORAL_TABLET | Freq: Every day | ORAL | Status: DC

## 2015-05-19 NOTE — Assessment & Plan Note (Signed)
Increasing allopurinol to 300 mg once daily, uric acid needs to be below 5, recheck in one month. We are getting close to the max renal dose.

## 2015-06-10 ENCOUNTER — Ambulatory Visit: Payer: Medicare Other | Admitting: Sports Medicine

## 2015-06-10 DIAGNOSIS — E11329 Type 2 diabetes mellitus with mild nonproliferative diabetic retinopathy without macular edema: Secondary | ICD-10-CM | POA: Diagnosis not present

## 2015-06-10 DIAGNOSIS — E11311 Type 2 diabetes mellitus with unspecified diabetic retinopathy with macular edema: Secondary | ICD-10-CM | POA: Diagnosis not present

## 2015-06-10 DIAGNOSIS — E11321 Type 2 diabetes mellitus with mild nonproliferative diabetic retinopathy with macular edema: Secondary | ICD-10-CM | POA: Diagnosis not present

## 2015-06-17 ENCOUNTER — Encounter: Payer: Self-pay | Admitting: Sports Medicine

## 2015-06-17 ENCOUNTER — Ambulatory Visit (INDEPENDENT_AMBULATORY_CARE_PROVIDER_SITE_OTHER): Payer: Medicare Other | Admitting: Sports Medicine

## 2015-06-17 VITALS — BP 121/60 | HR 55 | Wt 250.0 lb

## 2015-06-17 DIAGNOSIS — D0460 Carcinoma in situ of skin of unspecified upper limb, including shoulder: Secondary | ICD-10-CM | POA: Insufficient documentation

## 2015-06-17 DIAGNOSIS — I2510 Atherosclerotic heart disease of native coronary artery without angina pectoris: Secondary | ICD-10-CM | POA: Diagnosis not present

## 2015-06-17 DIAGNOSIS — M1000 Idiopathic gout, unspecified site: Secondary | ICD-10-CM

## 2015-06-17 DIAGNOSIS — M10031 Idiopathic gout, right wrist: Secondary | ICD-10-CM

## 2015-06-17 DIAGNOSIS — L989 Disorder of the skin and subcutaneous tissue, unspecified: Secondary | ICD-10-CM

## 2015-06-17 LAB — URIC ACID: Uric Acid, Serum: 6.9 mg/dL (ref 4.0–7.8)

## 2015-06-17 MED ORDER — TRIAMCINOLONE ACETONIDE 0.5 % EX OINT: 1.0000 "application " | TOPICAL_OINTMENT | Freq: Two times a day (BID) | CUTANEOUS | Status: DC

## 2015-06-17 NOTE — Progress Notes (Signed)
  Subjective:    CC: Follow-up  HPI: Gout: Moderate to high volume aspiration and injection of his right elbow at the last visit, pain-free, we also increased his allopurinol to 300 mg, uric acid level had dropped from 13-7, goal is less than 5. He was able to obtain some colchicine samples from his nephrologist which has also helped with a flare in his left acromioclavicular joint. Overall happy with how things are going.  Skin lesion: Left forearm, circular, pruritic. Present for years  Past medical history, Surgical history, Family history not pertinant except as noted below, Social history, Allergies, and medications have been entered into the medical record, reviewed, and no changes needed.   Review of Systems: No fevers, chills, night sweats, weight loss, chest pain, or shortness of breath.   Objective:    General: Well Developed, well nourished, and in no acute distress.  Neuro: Alert and oriented x3, extra-ocular muscles intact, sensation grossly intact.  HEENT: Normocephalic, atraumatic, pupils equal round reactive to light, neck supple, no masses, no lymphadenopathy, thyroid nonpalpable.  Skin: Warm and dry, there is a small, quarter-sized minimally excoriated scaling lesion on the left dorsal mid forearm. Cardiac: Regular rate and rhythm, no murmurs rubs or gallops, no lower extremity edema.  Respiratory: Clear to auscultation bilaterally. Not using accessory muscles, speaking in full sentences. Right Elbow: Unremarkable to inspection. Range of motion full pronation, supination, flexion, extension. Strength is full to all of the above directions Stable to varus, valgus stress. Negative moving valgus stress test. No discrete areas of tenderness to palpation. Ulnar nerve does not sublux. Negative cubital tunnel Tinel's.  Impression and Recommendations:    I spent 25 minutes with this patient, greater than 50% was face-to-face time counseling regarding the above diagnoses

## 2015-06-17 NOTE — Assessment & Plan Note (Addendum)
Adding triamcinolone, return in one month, excisional biopsy of no better. 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

## 2015-06-17 NOTE — Assessment & Plan Note (Addendum)
Elbow pain is completely resolved after aspiration and injection. We went up on allopurinol to 300 mg, previously the level was in the sevens, goal is less than 5, recheck uric acid today.  Uric acid levels are creeping down however not at goal, we are going to increase to 3 mg twice a day,We have been increasing gingerly considering renal insufficiency. Recheck renal function as well as uric acid in one month.

## 2015-06-18 MED ORDER — ALLOPURINOL 300 MG PO TABS: 300.0000 mg | ORAL_TABLET | Freq: Two times a day (BID) | ORAL | Status: DC

## 2015-06-18 NOTE — Addendum Note (Signed)
Addended by: Silverio Decamp on: 06/18/2015 12:07 PM   Modules accepted: Orders

## 2015-06-22 DIAGNOSIS — E11311 Type 2 diabetes mellitus with unspecified diabetic retinopathy with macular edema: Secondary | ICD-10-CM | POA: Diagnosis not present

## 2015-06-22 DIAGNOSIS — E11321 Type 2 diabetes mellitus with mild nonproliferative diabetic retinopathy with macular edema: Secondary | ICD-10-CM | POA: Diagnosis not present

## 2015-06-27 ENCOUNTER — Other Ambulatory Visit: Payer: Self-pay | Admitting: Cardiology

## 2015-07-13 ENCOUNTER — Other Ambulatory Visit: Payer: Self-pay | Admitting: *Deleted

## 2015-07-13 MED ORDER — ISOSORBIDE MONONITRATE 20 MG PO TABS: 20.0000 mg | ORAL_TABLET | Freq: Three times a day (TID) | ORAL | Status: DC

## 2015-07-15 ENCOUNTER — Encounter: Payer: Self-pay | Admitting: Sports Medicine

## 2015-07-15 ENCOUNTER — Ambulatory Visit (INDEPENDENT_AMBULATORY_CARE_PROVIDER_SITE_OTHER): Payer: Medicare Other | Admitting: Sports Medicine

## 2015-07-15 VITALS — BP 126/63 | HR 57 | Wt 248.0 lb

## 2015-07-15 DIAGNOSIS — D0462 Carcinoma in situ of skin of left upper limb, including shoulder: Secondary | ICD-10-CM | POA: Diagnosis not present

## 2015-07-15 DIAGNOSIS — M1000 Idiopathic gout, unspecified site: Secondary | ICD-10-CM

## 2015-07-15 DIAGNOSIS — I2510 Atherosclerotic heart disease of native coronary artery without angina pectoris: Secondary | ICD-10-CM

## 2015-07-15 DIAGNOSIS — M109 Gout, unspecified: Secondary | ICD-10-CM | POA: Diagnosis not present

## 2015-07-15 DIAGNOSIS — L989 Disorder of the skin and subcutaneous tissue, unspecified: Secondary | ICD-10-CM | POA: Diagnosis not present

## 2015-07-15 MED ORDER — OXYCODONE-ACETAMINOPHEN 5-325 MG PO TABS: 1.0000 | ORAL_TABLET | Freq: Three times a day (TID) | ORAL | Status: DC | PRN

## 2015-07-15 NOTE — Assessment & Plan Note (Signed)
Rechecking uric acid levels today, renal function.

## 2015-07-15 NOTE — Assessment & Plan Note (Addendum)
Surgical excision of 4.1cm skin lesion on the left arm with closure. Return in 10 days for wound inspection and likely suture removal. Oxycodone for pain.  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

## 2015-07-15 NOTE — Progress Notes (Signed)
  Subjective:    CC: Follow-up  HPI: Skin lesion: Left-sided, minimally pruritic, forearm, present for years and starting to grow, we try topical triamcinolone without any improvement, we did discuss surgical excision for dermatopathologic analysis.  Gout: Has been well-controlled recently, we did increase his allopurinol, and he is due for recheck of the uric acid.  Past medical history, Surgical history, Family history not pertinant except as noted below, Social history, Allergies, and medications have been entered into the medical record, reviewed, and no changes needed.   Review of Systems: No fevers, chills, night sweats, weight loss, chest pain, or shortness of breath.   Objective:    General: Well Developed, well nourished, and in no acute distress.  Neuro: Alert and oriented x3, extra-ocular muscles intact, sensation grossly intact.  HEENT: Normocephalic, atraumatic, pupils equal round reactive to light, neck supple, no masses, no lymphadenopathy, thyroid nonpalpable.  Skin: Warm and dry, no rashes. There is a 4.1 cm circular, scaly lesion on the left dorsal forearm. Cardiac: Regular rate and rhythm, no murmurs rubs or gallops, no lower extremity edema.  Respiratory: Clear to auscultation bilaterally. Not using accessory muscles, speaking in full sentences.  Procedure:  Excisional biopsy of left 4.1 cm skin lesion, likely benign Risks, benefits, and alternatives explained and consent obtained. Time out conducted. Surface prepped with alcohol. 5cc lidocaine with epinephine infiltrated in a field block. Adequate anesthesia ensured. Area prepped and draped in a sterile fashion. Excision performed with: Using a #15 blade and made an elliptical incision following langhans skin lines, then using both sharp and blunt dissection I was able to separate the lesion completely and it was sent off for analysis. I then used scissors to undermine the edges of the incision to reduce skin  tension, I then placed two 3-0 Ethilon horizontal mattress sutures to close the incision, and then Steri-Strips over the top to reinforce. The wound was then draped in bandage. Hemostasis achieved. Pt stable. Blood loss was minimal.  Impression and Recommendations:

## 2015-07-15 NOTE — Addendum Note (Signed)
Addended by: Doree Albee on: 07/15/2015 01:34 PM   Modules accepted: Orders

## 2015-07-16 ENCOUNTER — Telehealth: Payer: Self-pay

## 2015-07-16 LAB — BASIC METABOLIC PANEL
CO2: 28 mmol/L (ref 20–31)
Calcium: 9.1 mg/dL (ref 8.6–10.3)
Creat: 2.61 mg/dL — ABNORMAL HIGH (ref 0.70–1.25)
Glucose, Bld: 347 mg/dL — ABNORMAL HIGH (ref 65–99)

## 2015-07-16 LAB — BASIC METABOLIC PANEL WITH GFR
BUN: 67 mg/dL — ABNORMAL HIGH (ref 7–25)
Chloride: 92 mmol/L — ABNORMAL LOW (ref 98–110)
Potassium: 3.7 mmol/L (ref 3.5–5.3)
Sodium: 133 mmol/L — ABNORMAL LOW (ref 135–146)

## 2015-07-16 LAB — URIC ACID: Uric Acid, Serum: 4.2 mg/dL (ref 4.0–7.8)

## 2015-07-16 NOTE — Telephone Encounter (Signed)
Patient's wife called and wanted to confirm with Dr Dianah Field that it is ok to take allopurinol 300 mg bid.

## 2015-07-16 NOTE — Telephone Encounter (Signed)
Yes, loosen the bandage, change the dressing. Itching is normal, post surgery.

## 2015-07-16 NOTE — Telephone Encounter (Signed)
Patient's wife advised

## 2015-07-16 NOTE — Telephone Encounter (Signed)
Louis [name] wife, called. She reports he has itching under the bandage, swelling in his hand and numbness and tingling in his fingers. I advised him his bandage was to tight. I advised the itching under the bandage was normal.

## 2015-07-16 NOTE — Telephone Encounter (Signed)
Left message advising of recommendations.  

## 2015-07-16 NOTE — Telephone Encounter (Signed)
Continue to take it at the current dose, we did dose it for his kidney function previously, but the dose was not high enough, things are going well at the current dose, and renal function is steady.

## 2015-07-19 NOTE — Telephone Encounter (Signed)
Patient's wife advised

## 2015-07-20 NOTE — Progress Notes (Signed)
Ok noted. thanks

## 2015-07-22 DIAGNOSIS — E11321 Type 2 diabetes mellitus with mild nonproliferative diabetic retinopathy with macular edema: Secondary | ICD-10-CM | POA: Diagnosis not present

## 2015-07-27 ENCOUNTER — Ambulatory Visit: Payer: Medicare Other | Admitting: Sports Medicine

## 2015-07-27 ENCOUNTER — Encounter: Payer: Self-pay | Admitting: Sports Medicine

## 2015-07-27 VITALS — BP 129/64 | HR 63 | Ht 65.0 in | Wt 247.0 lb

## 2015-07-27 DIAGNOSIS — D0462 Carcinoma in situ of skin of left upper limb, including shoulder: Secondary | ICD-10-CM

## 2015-07-27 NOTE — Assessment & Plan Note (Signed)
Lesion excision was complete, the wound looks good, return to see me in 2 weeks for a final recheck wound.

## 2015-07-27 NOTE — Progress Notes (Signed)
  Subjective: 10 days postsurgical excision of a left forearm lesion that ended up being squama cell carcinoma. Doing well.   Objective: General: Well-developed, well-nourished, and in no acute distress. Left forearm: Skin is flaking but otherwise looks good, no signs of bacterial superinfection, sutures were removed today. I debrided devitalized tissue and cleaned the wound.  Assessment/plan:

## 2015-08-10 ENCOUNTER — Ambulatory Visit: Payer: Medicare Other | Admitting: Sports Medicine

## 2015-08-12 ENCOUNTER — Ambulatory Visit (INDEPENDENT_AMBULATORY_CARE_PROVIDER_SITE_OTHER): Payer: Medicare Other | Admitting: Sports Medicine

## 2015-08-12 ENCOUNTER — Encounter: Payer: Self-pay | Admitting: Sports Medicine

## 2015-08-12 VITALS — BP 132/61 | HR 60 | Wt 246.0 lb

## 2015-08-12 DIAGNOSIS — Z23 Encounter for immunization: Secondary | ICD-10-CM | POA: Diagnosis not present

## 2015-08-12 DIAGNOSIS — D0462 Carcinoma in situ of skin of left upper limb, including shoulder: Secondary | ICD-10-CM | POA: Diagnosis not present

## 2015-08-12 DIAGNOSIS — I2510 Atherosclerotic heart disease of native coronary artery without angina pectoris: Secondary | ICD-10-CM | POA: Diagnosis not present

## 2015-08-12 NOTE — Progress Notes (Signed)
  Subjective:    CC: Follow-up  HPI: Skin cancer: Continues to do well after surgical excision, clear margins, healing well.  Past medical history, Surgical history, Family history not pertinant except as noted below, Social history, Allergies, and medications have been entered into the medical record, reviewed, and no changes needed.   Review of Systems: No fevers, chills, night sweats, weight loss, chest pain, or shortness of breath.   Objective:    General: Well Developed, well nourished, and in no acute distress.  Neuro: Alert and oriented x3, extra-ocular muscles intact, sensation grossly intact.  HEENT: Normocephalic, atraumatic, pupils equal round reactive to light, neck supple, no masses, no lymphadenopathy, thyroid nonpalpable.  Skin: Warm and dry, no rashes. Incision appears well-healed. Cardiac: Regular rate and rhythm, no murmurs rubs or gallops, no lower extremity edema.  Respiratory: Clear to auscultation bilaterally. Not using accessory muscles, speaking in full sentences.  Impression and Recommendations:

## 2015-08-12 NOTE — Assessment & Plan Note (Signed)
Doing extremely well, return as needed.

## 2015-08-19 DIAGNOSIS — E1129 Type 2 diabetes mellitus with other diabetic kidney complication: Secondary | ICD-10-CM | POA: Diagnosis not present

## 2015-08-19 DIAGNOSIS — E1165 Type 2 diabetes mellitus with hyperglycemia: Secondary | ICD-10-CM | POA: Diagnosis not present

## 2015-08-19 LAB — HEPATIC FUNCTION PANEL
ALK PHOS: 99 U/L (ref 25–125)
ALT: 15 U/L (ref 10–40)
AST: 18 U/L (ref 14–40)
Bilirubin, Total: 0.7 mg/dL

## 2015-08-19 LAB — BASIC METABOLIC PANEL
BUN: 58 mg/dL — AB (ref 4–21)
Creatinine: 2.3 mg/dL — AB (ref <=1.3)
GLUCOSE: 170 mg/dL
Potassium: 3.2 mmol/L — AB (ref 3.4–5.3)
SODIUM: 137 mmol/L (ref 137–147)

## 2015-08-19 LAB — COMPREHENSIVE METABOLIC PANEL
Albumin Serum: 4.1
BUN / CREAT RATIO: 25
CALCIUM: 9.7 mg/dL
CO2: 27 mmol/L
Chloride: 92 mmol/L
EGFR (African American): 32
EGFR (Non-African Amer.): 28
Globulin, Total: 2.8
TOTAL PROTEIN: 6.9 g/dL

## 2015-09-02 DIAGNOSIS — E113213 Type 2 diabetes mellitus with mild nonproliferative diabetic retinopathy with macular edema, bilateral: Secondary | ICD-10-CM | POA: Diagnosis not present

## 2015-09-09 DIAGNOSIS — E113213 Type 2 diabetes mellitus with mild nonproliferative diabetic retinopathy with macular edema, bilateral: Secondary | ICD-10-CM | POA: Diagnosis not present

## 2015-09-28 DIAGNOSIS — D631 Anemia in chronic kidney disease: Secondary | ICD-10-CM | POA: Diagnosis not present

## 2015-09-28 DIAGNOSIS — M109 Gout, unspecified: Secondary | ICD-10-CM | POA: Diagnosis not present

## 2015-09-28 DIAGNOSIS — R200 Anesthesia of skin: Secondary | ICD-10-CM | POA: Diagnosis not present

## 2015-09-28 DIAGNOSIS — N184 Chronic kidney disease, stage 4 (severe): Secondary | ICD-10-CM | POA: Diagnosis not present

## 2015-09-28 DIAGNOSIS — R809 Proteinuria, unspecified: Secondary | ICD-10-CM | POA: Diagnosis not present

## 2015-09-28 DIAGNOSIS — M9901 Segmental and somatic dysfunction of cervical region: Secondary | ICD-10-CM | POA: Diagnosis not present

## 2015-09-28 DIAGNOSIS — E0822 Diabetes mellitus due to underlying condition with diabetic chronic kidney disease: Secondary | ICD-10-CM | POA: Diagnosis not present

## 2015-09-28 DIAGNOSIS — M9904 Segmental and somatic dysfunction of sacral region: Secondary | ICD-10-CM | POA: Diagnosis not present

## 2015-09-28 DIAGNOSIS — I129 Hypertensive chronic kidney disease with stage 1 through stage 4 chronic kidney disease, or unspecified chronic kidney disease: Secondary | ICD-10-CM | POA: Diagnosis not present

## 2015-09-28 DIAGNOSIS — N2581 Secondary hyperparathyroidism of renal origin: Secondary | ICD-10-CM | POA: Diagnosis not present

## 2015-09-28 DIAGNOSIS — M9903 Segmental and somatic dysfunction of lumbar region: Secondary | ICD-10-CM | POA: Diagnosis not present

## 2015-09-30 DIAGNOSIS — M9903 Segmental and somatic dysfunction of lumbar region: Secondary | ICD-10-CM | POA: Diagnosis not present

## 2015-09-30 DIAGNOSIS — M9904 Segmental and somatic dysfunction of sacral region: Secondary | ICD-10-CM | POA: Diagnosis not present

## 2015-09-30 DIAGNOSIS — R200 Anesthesia of skin: Secondary | ICD-10-CM | POA: Diagnosis not present

## 2015-09-30 DIAGNOSIS — M9901 Segmental and somatic dysfunction of cervical region: Secondary | ICD-10-CM | POA: Diagnosis not present

## 2015-10-02 DIAGNOSIS — M9901 Segmental and somatic dysfunction of cervical region: Secondary | ICD-10-CM | POA: Diagnosis not present

## 2015-10-02 DIAGNOSIS — R200 Anesthesia of skin: Secondary | ICD-10-CM | POA: Diagnosis not present

## 2015-10-02 DIAGNOSIS — M9903 Segmental and somatic dysfunction of lumbar region: Secondary | ICD-10-CM | POA: Diagnosis not present

## 2015-10-02 DIAGNOSIS — M9904 Segmental and somatic dysfunction of sacral region: Secondary | ICD-10-CM | POA: Diagnosis not present

## 2015-10-05 DIAGNOSIS — R200 Anesthesia of skin: Secondary | ICD-10-CM | POA: Diagnosis not present

## 2015-10-05 DIAGNOSIS — M9901 Segmental and somatic dysfunction of cervical region: Secondary | ICD-10-CM | POA: Diagnosis not present

## 2015-10-05 DIAGNOSIS — M9903 Segmental and somatic dysfunction of lumbar region: Secondary | ICD-10-CM | POA: Diagnosis not present

## 2015-10-05 DIAGNOSIS — M9904 Segmental and somatic dysfunction of sacral region: Secondary | ICD-10-CM | POA: Diagnosis not present

## 2015-10-06 DIAGNOSIS — M9903 Segmental and somatic dysfunction of lumbar region: Secondary | ICD-10-CM | POA: Diagnosis not present

## 2015-10-06 DIAGNOSIS — R200 Anesthesia of skin: Secondary | ICD-10-CM | POA: Diagnosis not present

## 2015-10-06 DIAGNOSIS — M9904 Segmental and somatic dysfunction of sacral region: Secondary | ICD-10-CM | POA: Diagnosis not present

## 2015-10-06 DIAGNOSIS — M9901 Segmental and somatic dysfunction of cervical region: Secondary | ICD-10-CM | POA: Diagnosis not present

## 2015-10-12 ENCOUNTER — Ambulatory Visit (INDEPENDENT_AMBULATORY_CARE_PROVIDER_SITE_OTHER): Payer: Medicare Other | Admitting: Family Medicine

## 2015-10-12 ENCOUNTER — Encounter: Payer: Self-pay | Admitting: Family Medicine

## 2015-10-12 VITALS — BP 129/48 | HR 78 | Temp 97.9°F | Resp 18 | Wt 249.2 lb

## 2015-10-12 DIAGNOSIS — E876 Hypokalemia: Secondary | ICD-10-CM | POA: Diagnosis not present

## 2015-10-12 DIAGNOSIS — M109 Gout, unspecified: Secondary | ICD-10-CM | POA: Diagnosis not present

## 2015-10-12 DIAGNOSIS — N183 Chronic kidney disease, stage 3 unspecified: Secondary | ICD-10-CM

## 2015-10-12 DIAGNOSIS — I2510 Atherosclerotic heart disease of native coronary artery without angina pectoris: Secondary | ICD-10-CM

## 2015-10-12 DIAGNOSIS — J209 Acute bronchitis, unspecified: Secondary | ICD-10-CM | POA: Diagnosis not present

## 2015-10-12 DIAGNOSIS — E113219 Type 2 diabetes mellitus with mild nonproliferative diabetic retinopathy with macular edema, unspecified eye: Secondary | ICD-10-CM

## 2015-10-12 LAB — HM DIABETES EYE EXAM

## 2015-10-12 LAB — URIC ACID: Uric Acid, Serum: 4.6 mg/dL (ref 4.0–7.8)

## 2015-10-12 NOTE — Progress Notes (Signed)
   Subjective:    Patient ID: Louis [name], male    DOB: June 22, 1946, 69 y.o.   MRN: [id number]  HPI Gout - no recent flares.  Has had some stiffness.  Woke up very stiff this AM.    CKD-3 - Sees Dr. Mercy Moore is monitoring.  Creatinine was 2.5 at last lab.  He is now on 600mg  of allopurinol n  Hypertension- Pt denies chest pain, SOB, dizziness, or heart palpitations.  Taking meds as directed w/o problems.  Denies medication side effects.    DM- last A1C is 8.8.  Follow with endocrinology.    Cough x 3 weeks, productive with green.  No SOB or wheezing.  No fever. No nasal congestion.  Trying to use warm compresses and concentrated oils.  Cough is waking up.  No swelling or extra fluid.   Review of Systems     Objective:   Physical Exam  Constitutional: He is oriented to person, place, and time. He appears well-developed and well-nourished.  HENT:  Head: Normocephalic and atraumatic.  Right Ear: External ear normal.  Left Ear: External ear normal.  Nose: Nose normal.  Mouth/Throat: Oropharynx is clear and moist.  TMs and canals are clear.   Eyes: Conjunctivae and EOM are normal. Pupils are equal, round, and reactive to light.  Neck: Neck supple. No thyromegaly present.  Cardiovascular: Normal rate and normal heart sounds.   Pulmonary/Chest: Effort normal and breath sounds normal.  Lymphadenopathy:    He has no cervical adenopathy.  Neurological: He is alert and oriented to person, place, and time.  Skin: Skin is warm and dry.  Psychiatric: He has a normal mood and affect.          Assessment & Plan:  Gout -  He is now 60 mg of allopurinol. If his uric acid level looks great we might try considering reducing hi dose to 450 mg and hen rechecking his level period a wnt to be cautious especially with his kidney function.  CKD-3 -  Due to recheck kidney function.  DM  With retinopathy-  His last A1c was elevated because he had reeived several steroid injections but normally  his A1c is under 7.  We'll get a copy of latest eye exam. 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  Acute bronchitis -  Likely viral. Continue symptom Medicare. If he is not better after one week or he feels like he suddenly getting worse or become short of breath or develops a fever then call us back sooner. Lung exam was clear today.

## 2015-10-13 ENCOUNTER — Other Ambulatory Visit: Payer: Self-pay | Admitting: Family Medicine

## 2015-10-13 DIAGNOSIS — M10031 Idiopathic gout, right wrist: Secondary | ICD-10-CM

## 2015-10-13 DIAGNOSIS — M1000 Idiopathic gout, unspecified site: Secondary | ICD-10-CM

## 2015-10-13 MED ORDER — COLCHICINE 0.6 MG PO TABS: 0.6000 mg | ORAL_TABLET | Freq: Every day | ORAL | Status: DC | PRN

## 2015-10-13 MED ORDER — ALLOPURINOL 300 MG PO TABS: 450.0000 mg | ORAL_TABLET | Freq: Two times a day (BID) | ORAL | Status: DC

## 2015-10-19 ENCOUNTER — Encounter: Payer: Self-pay | Admitting: Family Medicine

## 2015-10-21 DIAGNOSIS — E113213 Type 2 diabetes mellitus with mild nonproliferative diabetic retinopathy with macular edema, bilateral: Secondary | ICD-10-CM | POA: Diagnosis not present

## 2015-10-28 DIAGNOSIS — H0011 Chalazion right upper eyelid: Secondary | ICD-10-CM | POA: Insufficient documentation

## 2015-10-28 DIAGNOSIS — E113213 Type 2 diabetes mellitus with mild nonproliferative diabetic retinopathy with macular edema, bilateral: Secondary | ICD-10-CM | POA: Diagnosis not present

## 2015-11-02 DIAGNOSIS — L300 Nummular dermatitis: Secondary | ICD-10-CM | POA: Diagnosis not present

## 2015-11-09 DIAGNOSIS — M65871 Other synovitis and tenosynovitis, right ankle and foot: Secondary | ICD-10-CM | POA: Diagnosis not present

## 2015-11-09 DIAGNOSIS — S92331A Displaced fracture of third metatarsal bone, right foot, initial encounter for closed fracture: Secondary | ICD-10-CM | POA: Diagnosis not present

## 2015-11-16 ENCOUNTER — Other Ambulatory Visit: Payer: Self-pay | Admitting: Cardiology

## 2015-11-25 DIAGNOSIS — E113213 Type 2 diabetes mellitus with mild nonproliferative diabetic retinopathy with macular edema, bilateral: Secondary | ICD-10-CM | POA: Diagnosis not present

## 2015-11-30 DIAGNOSIS — I739 Peripheral vascular disease, unspecified: Secondary | ICD-10-CM | POA: Diagnosis not present

## 2015-11-30 DIAGNOSIS — E1151 Type 2 diabetes mellitus with diabetic peripheral angiopathy without gangrene: Secondary | ICD-10-CM | POA: Diagnosis not present

## 2015-11-30 DIAGNOSIS — L603 Nail dystrophy: Secondary | ICD-10-CM | POA: Diagnosis not present

## 2015-11-30 DIAGNOSIS — S92331G Displaced fracture of third metatarsal bone, right foot, subsequent encounter for fracture with delayed healing: Secondary | ICD-10-CM | POA: Diagnosis not present

## 2015-11-30 DIAGNOSIS — S92341G Displaced fracture of fourth metatarsal bone, right foot, subsequent encounter for fracture with delayed healing: Secondary | ICD-10-CM | POA: Diagnosis not present

## 2015-11-30 DIAGNOSIS — S92021A Displaced fracture of anterior process of right calcaneus, initial encounter for closed fracture: Secondary | ICD-10-CM | POA: Diagnosis not present

## 2015-11-30 DIAGNOSIS — S92351G Displaced fracture of fifth metatarsal bone, right foot, subsequent encounter for fracture with delayed healing: Secondary | ICD-10-CM | POA: Diagnosis not present

## 2015-12-02 DIAGNOSIS — E113213 Type 2 diabetes mellitus with mild nonproliferative diabetic retinopathy with macular edema, bilateral: Secondary | ICD-10-CM | POA: Diagnosis not present

## 2015-12-11 ENCOUNTER — Other Ambulatory Visit: Payer: Self-pay | Admitting: Cardiology

## 2015-12-13 NOTE — Telephone Encounter (Signed)
REFILL 

## 2015-12-16 DIAGNOSIS — E1165 Type 2 diabetes mellitus with hyperglycemia: Secondary | ICD-10-CM | POA: Diagnosis not present

## 2015-12-16 DIAGNOSIS — Z794 Long term (current) use of insulin: Secondary | ICD-10-CM | POA: Diagnosis not present

## 2015-12-16 DIAGNOSIS — E1122 Type 2 diabetes mellitus with diabetic chronic kidney disease: Secondary | ICD-10-CM | POA: Diagnosis not present

## 2015-12-16 DIAGNOSIS — N183 Chronic kidney disease, stage 3 (moderate): Secondary | ICD-10-CM | POA: Diagnosis not present

## 2015-12-23 DIAGNOSIS — N2581 Secondary hyperparathyroidism of renal origin: Secondary | ICD-10-CM | POA: Diagnosis not present

## 2015-12-23 DIAGNOSIS — E0822 Diabetes mellitus due to underlying condition with diabetic chronic kidney disease: Secondary | ICD-10-CM | POA: Diagnosis not present

## 2015-12-23 DIAGNOSIS — R809 Proteinuria, unspecified: Secondary | ICD-10-CM | POA: Diagnosis not present

## 2015-12-23 DIAGNOSIS — M109 Gout, unspecified: Secondary | ICD-10-CM | POA: Diagnosis not present

## 2015-12-23 DIAGNOSIS — D631 Anemia in chronic kidney disease: Secondary | ICD-10-CM | POA: Diagnosis not present

## 2015-12-23 DIAGNOSIS — I129 Hypertensive chronic kidney disease with stage 1 through stage 4 chronic kidney disease, or unspecified chronic kidney disease: Secondary | ICD-10-CM | POA: Diagnosis not present

## 2015-12-23 DIAGNOSIS — N184 Chronic kidney disease, stage 4 (severe): Secondary | ICD-10-CM | POA: Diagnosis not present

## 2015-12-29 DIAGNOSIS — N184 Chronic kidney disease, stage 4 (severe): Secondary | ICD-10-CM | POA: Diagnosis not present

## 2016-01-04 DIAGNOSIS — L300 Nummular dermatitis: Secondary | ICD-10-CM | POA: Diagnosis not present

## 2016-01-04 DIAGNOSIS — D485 Neoplasm of uncertain behavior of skin: Secondary | ICD-10-CM | POA: Diagnosis not present

## 2016-01-04 DIAGNOSIS — L308 Other specified dermatitis: Secondary | ICD-10-CM | POA: Diagnosis not present

## 2016-01-05 DIAGNOSIS — N184 Chronic kidney disease, stage 4 (severe): Secondary | ICD-10-CM | POA: Diagnosis not present

## 2016-01-06 DIAGNOSIS — E113213 Type 2 diabetes mellitus with mild nonproliferative diabetic retinopathy with macular edema, bilateral: Secondary | ICD-10-CM | POA: Diagnosis not present

## 2016-01-11 DIAGNOSIS — S92331G Displaced fracture of third metatarsal bone, right foot, subsequent encounter for fracture with delayed healing: Secondary | ICD-10-CM | POA: Diagnosis not present

## 2016-01-11 DIAGNOSIS — S92351G Displaced fracture of fifth metatarsal bone, right foot, subsequent encounter for fracture with delayed healing: Secondary | ICD-10-CM | POA: Diagnosis not present

## 2016-01-11 DIAGNOSIS — S92341G Displaced fracture of fourth metatarsal bone, right foot, subsequent encounter for fracture with delayed healing: Secondary | ICD-10-CM | POA: Diagnosis not present

## 2016-01-11 DIAGNOSIS — S92021G Displaced fracture of anterior process of right calcaneus, subsequent encounter for fracture with delayed healing: Secondary | ICD-10-CM | POA: Diagnosis not present

## 2016-01-14 DIAGNOSIS — N184 Chronic kidney disease, stage 4 (severe): Secondary | ICD-10-CM | POA: Diagnosis not present

## 2016-01-19 ENCOUNTER — Other Ambulatory Visit: Payer: Self-pay | Admitting: Cardiology

## 2016-01-19 NOTE — Telephone Encounter (Signed)
REFILL 

## 2016-01-26 ENCOUNTER — Other Ambulatory Visit: Payer: Self-pay | Admitting: Cardiology

## 2016-01-28 ENCOUNTER — Ambulatory Visit (INDEPENDENT_AMBULATORY_CARE_PROVIDER_SITE_OTHER): Payer: Medicare Other | Admitting: Family Medicine

## 2016-01-28 ENCOUNTER — Encounter: Payer: Self-pay | Admitting: Family Medicine

## 2016-01-28 VITALS — BP 126/52 | HR 58 | Wt 234.0 lb

## 2016-01-28 DIAGNOSIS — R509 Fever, unspecified: Secondary | ICD-10-CM

## 2016-01-28 DIAGNOSIS — E1122 Type 2 diabetes mellitus with diabetic chronic kidney disease: Secondary | ICD-10-CM

## 2016-01-28 DIAGNOSIS — R300 Dysuria: Secondary | ICD-10-CM

## 2016-01-28 DIAGNOSIS — N183 Chronic kidney disease, stage 3 unspecified: Secondary | ICD-10-CM

## 2016-01-28 DIAGNOSIS — E1129 Type 2 diabetes mellitus with other diabetic kidney complication: Secondary | ICD-10-CM | POA: Diagnosis not present

## 2016-01-28 DIAGNOSIS — B370 Candidal stomatitis: Secondary | ICD-10-CM | POA: Diagnosis not present

## 2016-01-28 LAB — CBC WITH DIFFERENTIAL/PLATELET
BASOS ABS: 0 10*3/uL (ref 0.0–0.1)
Basophils Relative: 0 % (ref 0–1)
EOS ABS: 0.2 10*3/uL (ref 0.0–0.7)
EOS PCT: 1 % (ref 0–5)
HEMATOCRIT: 39.2 % (ref 39.0–52.0)
Hemoglobin: 12.9 g/dL — ABNORMAL LOW (ref 13.0–17.0)
LYMPHS ABS: 1.4 10*3/uL (ref 0.7–4.0)
LYMPHS PCT: 9 % — AB (ref 12–46)
MCH: 28.9 pg (ref 26.0–34.0)
MCHC: 32.9 g/dL (ref 30.0–36.0)
MCV: 87.9 fL (ref 78.0–100.0)
MPV: 11.3 fL (ref 8.6–12.4)
Monocytes Absolute: 1.2 10*3/uL — ABNORMAL HIGH (ref 0.1–1.0)
Monocytes Relative: 8 % (ref 3–12)
NEUTROS PCT: 82 % — AB (ref 43–77)
Neutro Abs: 12.6 10*3/uL — ABNORMAL HIGH (ref 1.7–7.7)
PLATELETS: 282 10*3/uL (ref 150–400)
RBC: 4.46 MIL/uL (ref 4.22–5.81)
RDW: 16.3 % — AB (ref 11.5–15.5)
WBC: 15.4 10*3/uL — AB (ref 4.0–10.5)

## 2016-01-28 LAB — POCT URINALYSIS DIPSTICK
Bilirubin, UA: NEGATIVE
Blood, UA: NEGATIVE
GLUCOSE UA: NEGATIVE
KETONES UA: NEGATIVE
LEUKOCYTES UA: NEGATIVE
Nitrite, UA: NEGATIVE
PROTEIN UA: 100
SPEC GRAV UA: 1.015
Urobilinogen, UA: 0.2
pH, UA: 5.5

## 2016-01-28 LAB — POCT UA - MICROALBUMIN
Albumin/Creatinine Ratio, Urine, POC: >300
Creatinine, POC: 200 mg/dL
Microalbumin Ur, POC: 150 mg/L

## 2016-01-28 LAB — GLUCOSE, POCT (MANUAL RESULT ENTRY): POC Glucose: 143 mg/dl — AB (ref 70–99)

## 2016-01-28 LAB — POCT GLYCOSYLATED HEMOGLOBIN (HGB A1C): Hemoglobin A1C: 5.6

## 2016-01-28 MED ORDER — CIPROFLOXACIN HCL 500 MG PO TABS: 500.0000 mg | ORAL_TABLET | Freq: Two times a day (BID) | ORAL | Status: DC

## 2016-01-28 NOTE — Progress Notes (Signed)
Subjective:    Patient ID: Louis [name], male    DOB: 05-Oct-1946, 70 y.o.   MRN: [id number]  HPI 70 year old male with a history of insulin-dependent diabetes comes in today with 4 days of burning with urination. He denies any change in odor. He has had a some increase in urinary frequency. No pelvic pain or back pain with it. He did run a low-grade temperature last night around 100.   Diabetes-normally really well controlled. He is happily blood sugars that have been over 115 the last couple days which is very unusual for him.   Also noticed some white spots on his tongue-he says it's actually little better than it was but is worried that he may have thrush. He denies any worsening or alleviating factors.  Review of Systems  BP 126/52 mmHg  Pulse 58  Wt 234 lb (106.142 kg)  SpO2 97%    Allergies  Allergen Reactions  . Hydrocodone Nausea And Vomiting  . Oxycodone Nausea And Vomiting    Past Medical History  Diagnosis Date  . Diabetes mellitus   . Hypertension   . Hyperlipidemia   . OSA on CPAP   . Renal insufficiency   . CAD (coronary artery disease)   . Personal history of colonic polyps - adenomas 01/28/2014  . Heart failure, diastolic ([place])   . Shortness of breath dyspnea     Past Surgical History  Procedure Laterality Date  . Pilonidal cyst excision    . Carpal tunnel release      Social History   Social History  . Marital Status: Married    Spouse Name: N/A  . Number of Children: 5  . Years of Education: N/A   Occupational History  . Warehouse    Social History Main Topics  . Smoking status: Former Smoker -- 1.00 packs/day for 20 years    Types: Cigarettes    Quit date: 03/06/1977  . Smokeless tobacco: Never Used  . Alcohol Use: No  . Drug Use: No  . Sexual Activity: Not on file   Other Topics Concern  . Not on file   Social History Narrative    Family History  Problem Relation Age of Onset  . Diabetes Mother   . Coronary artery  disease Father 10    CABG  . Diabetes Father   . Colon cancer Neg Hx     Outpatient Encounter Prescriptions as of 01/28/2016  Medication Sig  . allopurinol (ZYLOPRIM) 300 MG tablet Take 1.5 tablets (450 mg total) by mouth 2 (two) times daily.  Marland Kitchen aspirin 81 MG tablet Take 81 mg by mouth daily.    Marland Kitchen BENICAR 40 MG tablet TAKE 1 TABLET EVERY DAY  . Cholecalciferol (VITAMIN D-3 PO) Take 2,000 Units by mouth daily.   . ciprofloxacin (CIPRO) 500 MG tablet Take 1 tablet (500 mg total) by mouth 2 (two) times daily.  . cloNIDine (CATAPRES) 0.2 MG tablet TAKE 1 TABLET (0.2 MG TOTAL) BY MOUTH 3 (THREE) TIMES DAILY.  Marland Kitchen colchicine 0.6 MG tablet Take 1 tablet (0.6 mg total) by mouth daily as needed. For Gout Flare.  Can substitute generic or brand, which ever is cheaper.  . furosemide (LASIX) 80 MG tablet Take 1 tablet by mouth 2 (two) times daily.   . hydrALAZINE (APRESOLINE) 100 MG tablet Take 1 tablet (100 mg total) by mouth 3 (three) times daily.  . insulin aspart (NOVOLOG) 100 UNIT/ML FlexPen Inject into the skin 3 (three) times daily with meals. Sliding  scale  . insulin detemir (LEVEMIR) 100 UNIT/ML injection Inject 110-120 Units into the skin at bedtime.   . isosorbide mononitrate (ISMO,MONOKET) 20 MG tablet Take 1 tablet (20 mg total) by mouth 3 (three) times daily. NEED OV.  . metoprolol succinate (TOPROL-XL) 100 MG 24 hr tablet Take 100 mg by mouth daily.  . nitroGLYCERIN (NITROSTAT) 0.4 MG SL tablet Place 1 tablet (0.4 mg total) under the tongue every 5 (five) minutes as needed for chest pain.  Marland Kitchen triamcinolone ointment (KENALOG) 0.5 % Apply 1 application topically 2 (two) times daily. To affected area, avoid eyes and face  . [DISCONTINUED] atorvastatin (LIPITOR) 80 MG tablet TAKE 1 TABLET (80 MG TOTAL) BY MOUTH DAILY.   No facility-administered encounter medications on file as of 01/28/2016.     '     Objective:   Physical Exam  Physical Exam  Constitutional: He is oriented to person,  place, and time. He appears well-developed and well-nourished.  HENT:  Head: Normocephalic and atraumatic.  Right Ear: External ear normal.  Left Ear: External ear normal.  Nose: Nose normal.  Mouth/Throat: Oropharynx is clear and moist.  TMs and canals are clear. White patches on his tongue.  Eyes: Conjunctivae and EOM are normal. Pupils are equal, round, and reactive to light.  Neck: Neck supple. No thyromegaly present.  Cardiovascular: Normal rate and normal heart sounds.  Pulmonary/Chest: Effort normal and breath sounds normal.  Abdominal: Soft. Bowel sounds are normal. He exhibits no distension and no mass. There is no tenderness. There is no rebound and no guarding.  Lymphadenopathy:  He has no cervical adenopathy.  Neurological: He is alert and oriented to person, place, and time.  Skin: Skin is warm and dry.  Psychiatric: He has a normal mood and affect.        Assessment & Plan:  Dysuria-urinalysis is technically negative but on the go ahead and put him on Cipro for the weekend since her head into the weekend. I definitely think he is battling some type of infection with not feeling well, low-grade temperature and increase in blood sugars. Will call with urine culture once available. Will check CBC today as well.  Diabetes-well-controlled. A1c of 5.6 today which looks absolutely fantastic.  White spots in mouth-KOH specimen collected today. Possible thrush. Will call for results once available.

## 2016-01-29 LAB — COMPLETE METABOLIC PANEL WITH GFR
ALT: 13 U/L (ref 9–46)
AST: 18 U/L (ref 10–35)
Albumin: 3.7 g/dL (ref 3.6–5.1)
Alkaline Phosphatase: 63 U/L (ref 40–115)
BILIRUBIN TOTAL: 1.2 mg/dL (ref 0.2–1.2)
BUN: 87 mg/dL — AB (ref 7–25)
CHLORIDE: 90 mmol/L — AB (ref 98–110)
CO2: 25 mmol/L (ref 20–31)
CREATININE: 2.84 mg/dL — AB (ref 0.70–1.18)
Calcium: 9.5 mg/dL (ref 8.6–10.3)
GFR, EST AFRICAN AMERICAN: 25 mL/min — AB (ref >=60)
GFR, Est Non African American: 21 mL/min — ABNORMAL LOW (ref >=60)
Glucose, Bld: 115 mg/dL — ABNORMAL HIGH (ref 65–99)
Potassium: 3.4 mmol/L — ABNORMAL LOW (ref 3.5–5.3)
Sodium: 133 mmol/L — ABNORMAL LOW (ref 135–146)
TOTAL PROTEIN: 6.8 g/dL (ref 6.1–8.1)

## 2016-01-31 LAB — URINE CULTURE

## 2016-01-31 LAB — FUNGAL STAIN

## 2016-02-09 ENCOUNTER — Other Ambulatory Visit: Payer: Self-pay | Admitting: Cardiology

## 2016-02-09 NOTE — Telephone Encounter (Signed)
Rx request sent to pharmacy.  

## 2016-02-10 DIAGNOSIS — E113212 Type 2 diabetes mellitus with mild nonproliferative diabetic retinopathy with macular edema, left eye: Secondary | ICD-10-CM | POA: Diagnosis not present

## 2016-02-17 ENCOUNTER — Ambulatory Visit (INDEPENDENT_AMBULATORY_CARE_PROVIDER_SITE_OTHER): Payer: Medicare Other | Admitting: Family Medicine

## 2016-02-17 ENCOUNTER — Encounter: Payer: Self-pay | Admitting: Family Medicine

## 2016-02-17 ENCOUNTER — Telehealth: Payer: Self-pay | Admitting: Family Medicine

## 2016-02-17 VITALS — BP 126/59 | HR 51 | Wt 229.0 lb

## 2016-02-17 DIAGNOSIS — M2550 Pain in unspecified joint: Secondary | ICD-10-CM

## 2016-02-17 DIAGNOSIS — M1A39X1 Chronic gout due to renal impairment, multiple sites, with tophus (tophi): Secondary | ICD-10-CM

## 2016-02-17 DIAGNOSIS — M25511 Pain in right shoulder: Secondary | ICD-10-CM | POA: Diagnosis not present

## 2016-02-17 DIAGNOSIS — M25512 Pain in left shoulder: Secondary | ICD-10-CM

## 2016-02-17 DIAGNOSIS — Z23 Encounter for immunization: Secondary | ICD-10-CM | POA: Diagnosis not present

## 2016-02-17 DIAGNOSIS — M25519 Pain in unspecified shoulder: Secondary | ICD-10-CM | POA: Diagnosis not present

## 2016-02-17 LAB — C-REACTIVE PROTEIN: CRP: 7 mg/dL — AB (ref <0.60)

## 2016-02-17 LAB — RHEUMATOID FACTOR: Rhuematoid fact SerPl-aCnc: <10 IU/mL (ref <=14)

## 2016-02-17 LAB — TSH: TSH: 1.66 mIU/L (ref 0.40–4.50)

## 2016-02-17 LAB — URIC ACID: URIC ACID, SERUM: 7 mg/dL (ref 4.0–7.8)

## 2016-02-17 MED ORDER — AMBULATORY NON FORMULARY MEDICATION: Status: DC

## 2016-02-17 NOTE — Progress Notes (Addendum)
Subjective:    Patient ID: Louis [name], male    DOB: Nov 14, 1946, 70 y.o.   MRN: [id number]  HPI 70 year old male patient with a history of gout, hypertension, diabetes, coronary artery disease who comes in today complaining of upper body joint pain.He says the pain is actually been coming and going for about a year. It predominantly affects the shoulders elbows wrists and fingers. He's actually been seeing a chiropractor for his back. Recommend that he get some extra blood work tested as well. His last uric acid level in May was 4.6. So we decided to try decreasing his albuterol to 450 mg.He's says that the left side is worse than his right. And also if he falls asleep on his shoulder he says the whole arm will go numb. He says most the time his hand particularly pain in the PIPs is what bothers him the most it even wakes him up at night. He did try to tumeric. He says it brings him about 3-4 hours of relief. And occasionally will take a Tylenol.     Review of Systems   BP 126/59 mmHg  Pulse 51  Wt 229 lb (103.874 kg)  SpO2 100%    Allergies  Allergen Reactions  . Hydrocodone Nausea And Vomiting  . Oxycodone Nausea And Vomiting    Past Medical History  Diagnosis Date  . Diabetes mellitus   . Hypertension   . Hyperlipidemia   . OSA on CPAP   . Renal insufficiency   . CAD (coronary artery disease)   . Personal history of colonic polyps - adenomas 01/28/2014  . Heart failure, diastolic ([place])   . Shortness of breath dyspnea     Past Surgical History  Procedure Laterality Date  . Pilonidal cyst excision    . Carpal tunnel release      Social History   Social History  . Marital Status: Married    Spouse Name: N/A  . Number of Children: 5  . Years of Education: N/A   Occupational History  . Warehouse    Social History Main Topics  . Smoking status: Former Smoker -- 1.00 packs/day for 20 years    Types: Cigarettes    Quit date: 03/06/1977  . Smokeless tobacco:  Never Used  . Alcohol Use: No  . Drug Use: No  . Sexual Activity: Not on file   Other Topics Concern  . Not on file   Social History Narrative    Family History  Problem Relation Age of Onset  . Diabetes Mother   . Coronary artery disease Father 36    CABG  . Diabetes Father   . Colon cancer Neg Hx     Outpatient Encounter Prescriptions as of 02/17/2016  Medication Sig  . allopurinol (ZYLOPRIM) 300 MG tablet Take 1.5 tablets (450 mg total) by mouth 2 (two) times daily.  . AMBULATORY NON FORMULARY MEDICATION Medication Name: Tdap IM injection x 1  . AMBULATORY NON FORMULARY MEDICATION Take 3 tablets by mouth daily. Medication Name: Curamin  . aspirin 81 MG tablet Take 81 mg by mouth daily.    . ciprofloxacin (CIPRO) 500 MG tablet Take 1 tablet (500 mg total) by mouth 2 (two) times daily.  . cloNIDine (CATAPRES) 0.2 MG tablet Take 1 tablet (0.2 mg total) by mouth daily. Please schedule appointment for refills.  . colchicine 0.6 MG tablet Take 1 tablet (0.6 mg total) by mouth daily as needed. For Gout Flare.  Can substitute generic or brand, which  ever is cheaper.  . furosemide (LASIX) 80 MG tablet Take 1 tablet by mouth 2 (two) times daily.   . hydrALAZINE (APRESOLINE) 100 MG tablet Take 1 tablet (100 mg total) by mouth 3 (three) times daily.  . isosorbide mononitrate (ISMO,MONOKET) 20 MG tablet Take 1 tablet (20 mg total) by mouth 3 (three) times daily. NEED OV.  . metoprolol succinate (TOPROL-XL) 100 MG 24 hr tablet Take 100 mg by mouth daily.  . nitroGLYCERIN (NITROSTAT) 0.4 MG SL tablet Place 1 tablet (0.4 mg total) under the tongue every 5 (five) minutes as needed for chest pain.  . NON FORMULARY   . NOVOLIN N 100 UNIT/ML injection INJECT 70 UNITS EVERY MORNING AND AND 25 UNITS AT EVERY EVENING  . NOVOLIN R 100 UNIT/ML injection INJECT 50 UNITS WITH EACH MEAL  . triamcinolone ointment (KENALOG) 0.5 % Apply 1 application topically 2 (two) times daily. To affected area, avoid  eyes and face  . [DISCONTINUED] BENICAR 40 MG tablet TAKE 1 TABLET EVERY DAY  . [DISCONTINUED] Cholecalciferol (VITAMIN D-3 PO) Take 2,000 Units by mouth daily.   . [DISCONTINUED] insulin aspart (NOVOLOG) 100 UNIT/ML FlexPen Inject into the skin 3 (three) times daily with meals. Sliding scale  . [DISCONTINUED] insulin detemir (LEVEMIR) 100 UNIT/ML injection Inject 110-120 Units into the skin at bedtime.    No facility-administered encounter medications on file as of 02/17/2016.          Objective:   Physical Exam  Constitutional: He is oriented to person, place, and time. He appears well-developed and well-nourished.  HENT:  Head: Normocephalic and atraumatic.  Cardiovascular: Normal rate, regular rhythm and normal heart sounds.   Pulmonary/Chest: Effort normal and breath sounds normal.  Musculoskeletal:  Right shoulder with fairly normal range of motion slight decreased extension compared. He is tender over the before meals joint. Nontender over the right elbow wrist or fingers today. No distinct swelling or erythema of the joints. Left shoulder with significantly decreased internal and external rotation. It caused pain over the anterior shoulder with these motions. He is unable to reach to his low spine with the left arm. Extension is about 175.   Neurological: He is alert and oriented to person, place, and time.  Skin: Skin is warm and dry.  Psychiatric: He has a normal mood and affect. His behavior is normal.       Assessment & Plan:  Gout - Consider that his increase flare in pain could be that his uric at less than levels are back up. We actually decreased his allopurinol down to 450 mg back in November because his levels look so great and I'll trying to minimize exposure to his kidneys because he does have CKD 3. We'll recheck uric acid level today.  Left Shoulder pain - he is definitely starting to get some limited range of motion that left shoulder consistent with possible  early frozen shoulder. Recommend an x-ray for further evaluation. He said he remained pretty had an MRI done maybe about 2 years ago in [place]. I'll try to pull that record first before we move forward with x-ray.  Polyarthralgia-consider that he may also have some type of other underlying autoimmune disorder especially with his history of diabetes. He may have more than just gout so we'll do some additional rheumatologic workup including sedimentation rate and CRP. Interestingly he did have an elevated sedimentation rate last summer.

## 2016-02-17 NOTE — Telephone Encounter (Signed)
Please call [facility] and see if they have a copy of an MRI that was done probably around 2015 for his shoulder and neck. Please see if they can fax it to our office.

## 2016-02-18 LAB — HOMOCYSTEINE: HOMOCYSTEINE: 22.4 umol/L — AB (ref <11.4)

## 2016-02-18 LAB — ANA: Anti Nuclear Antibody(ANA): POSITIVE — AB

## 2016-02-18 LAB — ANTI-NUCLEAR AB-TITER (ANA TITER): ANA Titer 1: 1:160 {titer} — ABNORMAL HIGH

## 2016-02-18 LAB — CYCLIC CITRUL PEPTIDE ANTIBODY, IGG

## 2016-02-18 LAB — VITAMIN D 25 HYDROXY (VIT D DEFICIENCY, FRACTURES): Vit D, 25-Hydroxy: 55 ng/mL (ref 30–100)

## 2016-02-18 LAB — SEDIMENTATION RATE: SED RATE: 81 mm/h — AB (ref 0–20)

## 2016-02-21 ENCOUNTER — Other Ambulatory Visit: Payer: Self-pay | Admitting: Family Medicine

## 2016-02-21 DIAGNOSIS — M1000 Idiopathic gout, unspecified site: Secondary | ICD-10-CM

## 2016-02-21 DIAGNOSIS — M10031 Idiopathic gout, right wrist: Secondary | ICD-10-CM

## 2016-02-21 DIAGNOSIS — R768 Other specified abnormal immunological findings in serum: Secondary | ICD-10-CM

## 2016-02-21 DIAGNOSIS — M2550 Pain in unspecified joint: Secondary | ICD-10-CM

## 2016-02-21 MED ORDER — ALLOPURINOL 300 MG PO TABS: 600.0000 mg | ORAL_TABLET | Freq: Two times a day (BID) | ORAL | Status: DC

## 2016-02-21 NOTE — Telephone Encounter (Signed)
lvm for MR w/information to fax over mri results.Louis [name] Louis [name]

## 2016-02-22 ENCOUNTER — Ambulatory Visit: Payer: Medicare Other | Admitting: Family Medicine

## 2016-02-22 DIAGNOSIS — S92021K Displaced fracture of anterior process of right calcaneus, subsequent encounter for fracture with nonunion: Secondary | ICD-10-CM | POA: Diagnosis not present

## 2016-02-22 DIAGNOSIS — S92344K Nondisplaced fracture of fourth metatarsal bone, right foot, subsequent encounter for fracture with nonunion: Secondary | ICD-10-CM | POA: Diagnosis not present

## 2016-02-22 DIAGNOSIS — S92334K Nondisplaced fracture of third metatarsal bone, right foot, subsequent encounter for fracture with nonunion: Secondary | ICD-10-CM | POA: Diagnosis not present

## 2016-02-22 DIAGNOSIS — S92351K Displaced fracture of fifth metatarsal bone, right foot, subsequent encounter for fracture with nonunion: Secondary | ICD-10-CM | POA: Diagnosis not present

## 2016-02-24 DIAGNOSIS — E113211 Type 2 diabetes mellitus with mild nonproliferative diabetic retinopathy with macular edema, right eye: Secondary | ICD-10-CM | POA: Diagnosis not present

## 2016-02-28 ENCOUNTER — Other Ambulatory Visit: Payer: Self-pay | Admitting: *Deleted

## 2016-02-29 ENCOUNTER — Other Ambulatory Visit: Payer: Self-pay | Admitting: *Deleted

## 2016-03-01 ENCOUNTER — Other Ambulatory Visit: Payer: Self-pay

## 2016-03-01 MED ORDER — ISOSORBIDE MONONITRATE 20 MG PO TABS: 20.0000 mg | ORAL_TABLET | Freq: Three times a day (TID) | ORAL | Status: DC

## 2016-03-02 ENCOUNTER — Telehealth: Payer: Self-pay | Admitting: *Deleted

## 2016-03-02 NOTE — Telephone Encounter (Signed)
Spoke w/pt and he is ok with seeing Dr. Vijay Durflinger Boards.Louis [name] [place]

## 2016-03-02 NOTE — Telephone Encounter (Signed)
Pt called and lvm stating that he wants to be seen by Dr. Franki Monte. . I called pt back and lvm informing him that an appt has already been made for him with Dr. Brittlyn Cloe Boards for 03/14/16 @ 1015 AM Asked that he call back if he would like for this to be changed.Audelia Hives [place]

## 2016-03-14 DIAGNOSIS — M150 Primary generalized (osteo)arthritis: Secondary | ICD-10-CM | POA: Diagnosis not present

## 2016-03-14 DIAGNOSIS — M1009 Idiopathic gout, multiple sites: Secondary | ICD-10-CM | POA: Diagnosis not present

## 2016-03-16 DIAGNOSIS — E1129 Type 2 diabetes mellitus with other diabetic kidney complication: Secondary | ICD-10-CM | POA: Diagnosis not present

## 2016-03-16 DIAGNOSIS — E1142 Type 2 diabetes mellitus with diabetic polyneuropathy: Secondary | ICD-10-CM | POA: Diagnosis not present

## 2016-03-16 DIAGNOSIS — E113299 Type 2 diabetes mellitus with mild nonproliferative diabetic retinopathy without macular edema, unspecified eye: Secondary | ICD-10-CM | POA: Diagnosis not present

## 2016-03-16 LAB — HEPATIC FUNCTION PANEL
ALT: 16 U/L (ref 10–40)
AST: 16 U/L (ref 14–40)
Alkaline Phosphatase: 94 U/L (ref 25–125)
BILIRUBIN, TOTAL: 0.6 mg/dL

## 2016-03-16 LAB — CBC AND DIFFERENTIAL
HEMATOCRIT: 43 % (ref 41–53)
Hemoglobin: 14.2 g/dL (ref 13.5–17.5)
NEUTROS ABS: 6 /uL
PLATELETS: 271 10*3/uL (ref 150–399)
WBC: 8.1 10*3/mL

## 2016-03-16 LAB — BASIC METABOLIC PANEL
BUN: 30 mg/dL — AB (ref 4–21)
Creatinine: 2.6 mg/dL — AB (ref 0.6–1.3)
GLUCOSE: 61 mg/dL
Potassium: 3.5 mmol/L (ref 3.4–5.3)
Sodium: 139 mmol/L (ref 137–147)

## 2016-03-23 DIAGNOSIS — I129 Hypertensive chronic kidney disease with stage 1 through stage 4 chronic kidney disease, or unspecified chronic kidney disease: Secondary | ICD-10-CM | POA: Diagnosis not present

## 2016-03-23 DIAGNOSIS — R809 Proteinuria, unspecified: Secondary | ICD-10-CM | POA: Diagnosis not present

## 2016-03-23 DIAGNOSIS — M109 Gout, unspecified: Secondary | ICD-10-CM | POA: Diagnosis not present

## 2016-03-23 DIAGNOSIS — E0822 Diabetes mellitus due to underlying condition with diabetic chronic kidney disease: Secondary | ICD-10-CM | POA: Diagnosis not present

## 2016-03-23 DIAGNOSIS — D631 Anemia in chronic kidney disease: Secondary | ICD-10-CM | POA: Diagnosis not present

## 2016-03-23 DIAGNOSIS — N2581 Secondary hyperparathyroidism of renal origin: Secondary | ICD-10-CM | POA: Diagnosis not present

## 2016-03-23 DIAGNOSIS — N184 Chronic kidney disease, stage 4 (severe): Secondary | ICD-10-CM | POA: Diagnosis not present

## 2016-03-28 ENCOUNTER — Other Ambulatory Visit: Payer: Self-pay | Admitting: Cardiology

## 2016-03-29 ENCOUNTER — Other Ambulatory Visit: Payer: Self-pay | Admitting: Cardiology

## 2016-03-29 NOTE — Telephone Encounter (Signed)
Rx has been sent to the pharmacy electronically. ° °

## 2016-03-30 DIAGNOSIS — E113211 Type 2 diabetes mellitus with mild nonproliferative diabetic retinopathy with macular edema, right eye: Secondary | ICD-10-CM | POA: Diagnosis not present

## 2016-04-05 ENCOUNTER — Ambulatory Visit (INDEPENDENT_AMBULATORY_CARE_PROVIDER_SITE_OTHER): Payer: Medicare Other | Admitting: Cardiology

## 2016-04-05 ENCOUNTER — Encounter: Payer: Self-pay | Admitting: Cardiology

## 2016-04-05 VITALS — BP 151/80 | HR 68 | Ht 65.0 in | Wt 228.0 lb

## 2016-04-05 DIAGNOSIS — I10 Essential (primary) hypertension: Secondary | ICD-10-CM

## 2016-04-05 DIAGNOSIS — I5031 Acute diastolic (congestive) heart failure: Secondary | ICD-10-CM | POA: Diagnosis not present

## 2016-04-05 DIAGNOSIS — I2510 Atherosclerotic heart disease of native coronary artery without angina pectoris: Secondary | ICD-10-CM | POA: Diagnosis not present

## 2016-04-05 DIAGNOSIS — E785 Hyperlipidemia, unspecified: Secondary | ICD-10-CM

## 2016-04-05 MED ORDER — ATORVASTATIN CALCIUM 80 MG PO TABS: 80.0000 mg | ORAL_TABLET | Freq: Every day | ORAL | Status: DC

## 2016-04-05 NOTE — Assessment & Plan Note (Signed)
Blood pressure is mildly elevated. However he follows this closely at home and it is typically controlled. Continue present medications.

## 2016-04-05 NOTE — Progress Notes (Signed)
HPI: FU CAD and diastolic CHF. Myoview performed in April 2012 revealed apical ischemia and an ejection fraction of 56%. ABIs were normal. Patient had cardiac catheterization in April of 2012 that revealed 50% distal LM stenosis. LAD - mild diffuse 20% disease proximally. The LAD is occluded in the mid segment after the second diagonal and septal branch. It fills through the very faint collaterals and overall appears to be diffusely diseased. First diagonal is a small-sized branch with a 60% ostial stenosis. Second diagonal is a relatively large size branch with 40-50% proximal disease. The third diagonal appears to have 2 branches and subtotally occluded. LCX with diffuse 40% disease in the mid segment before the takeoff of OM-3. RCA with 30% proximal discrete stenosis. There is also 20% mid stenosis. There is a 30% distal stenosis before the bifurcation. Medical therapy recommended. Admitted in November 2015 with acute diastolic heart failure. Patient was diuresed. Echocardiogram November 2015 showed normal LV function, grade 2 diastolic dysfunction, moderate left atrial enlargement and mild mitral regurgitation. He developed significant renal insufficiency with diuresis. Now followed by nephrology. Since last seen, Patient denies dyspnea on exertion, orthopnea, PND, pedal edema, chest pain, palpitations or syncope. He apparently will be considered for renal transplant.  Current Outpatient Prescriptions  Medication Sig Dispense Refill  . allopurinol (ZYLOPRIM) 300 MG tablet Take 2 tablets (600 mg total) by mouth 2 (two) times daily. 60 tablet 11  . AMBULATORY NON FORMULARY MEDICATION Medication Name: Tdap IM injection x 1 1 vial 0  . aspirin 81 MG tablet Take 81 mg by mouth daily.      . ciprofloxacin (CIPRO) 500 MG tablet Take 1 tablet (500 mg total) by mouth 2 (two) times daily. 14 tablet 0  . cloNIDine (CATAPRES) 0.2 MG tablet TAKE 1 TABLET (0.2 MG TOTAL) BY MOUTH DAILY. PLEASE SCHEDULE  APPOINTMENT FOR REFILLS. 30 tablet 0  . colchicine 0.6 MG tablet Take 1 tablet (0.6 mg total) by mouth daily as needed. For Gout Flare.  Can substitute generic or brand, which ever is cheaper. 30 tablet 2  . furosemide (LASIX) 80 MG tablet Take 1 tablet by mouth 2 (two) times daily.     . hydrALAZINE (APRESOLINE) 100 MG tablet Take 1 tablet (100 mg total) by mouth 3 (three) times daily.    . isosorbide mononitrate (ISMO,MONOKET) 20 MG tablet Take 1 tablet (20 mg total) by mouth 3 (three) times daily. NEED OV. 90 tablet 0  . metoprolol succinate (TOPROL-XL) 100 MG 24 hr tablet Take 100 mg by mouth daily.  6  . nitroGLYCERIN (NITROSTAT) 0.4 MG SL tablet Place 1 tablet (0.4 mg total) under the tongue every 5 (five) minutes as needed for chest pain. 25 tablet 0  . NON FORMULARY     . NOVOLIN N 100 UNIT/ML injection INJECT 70 UNITS EVERY MORNING AND AND 80 UNITS AT EVERY EVENING  5  . NOVOLIN R 100 UNIT/ML injection INJECT 50 UNITS WITH EACH MEAL  5  . triamcinolone ointment (KENALOG) 0.5 % Apply 1 application topically 2 (two) times daily. To affected area, avoid eyes and face 30 g 3  . atorvastatin (LIPITOR) 80 MG tablet Take 1 tablet (80 mg total) by mouth daily. 30 tablet 12   No current facility-administered medications for this visit.     Past Medical History  Diagnosis Date  . Diabetes mellitus   . Hypertension   . Hyperlipidemia   . OSA on CPAP   . Renal insufficiency   .  CAD (coronary artery disease)   . Personal history of colonic polyps - adenomas 01/28/2014  . Heart failure, diastolic ([place])   . Shortness of breath dyspnea     Past Surgical History  Procedure Laterality Date  . Pilonidal cyst excision    . Carpal tunnel release      Social History   Social History  . Marital Status: Married    Spouse Name: N/A  . Number of Children: 5  . Years of Education: N/A   Occupational History  . Warehouse    Social History Main Topics  . Smoking status: Former Smoker --  1.00 packs/day for 20 years    Types: Cigarettes    Quit date: 03/06/1977  . Smokeless tobacco: Never Used  . Alcohol Use: No  . Drug Use: No  . Sexual Activity: Not on file   Other Topics Concern  . Not on file   Social History Narrative    Family History  Problem Relation Age of Onset  . Diabetes Mother   . Coronary artery disease Father 26    CABG  . Diabetes Father   . Colon cancer Neg Hx     ROS: no fevers or chills, productive cough, hemoptysis, dysphasia, odynophagia, melena, hematochezia, dysuria, hematuria, rash, seizure activity, orthopnea, PND, pedal edema, claudication. Remaining systems are negative.  Physical Exam: Well-developed obese in no acute distress.  Skin is warm and dry.  HEENT is normal.  Neck is supple.  Chest is clear to auscultation with normal expansion.  Cardiovascular exam is regular rate and rhythm.  Abdominal exam nontender or distended. No masses palpated. Extremities show no edema. neuro grossly intact  ECG Sinus rhythm at a rate of 68. First-degree AV block. Left ventricular hypertrophy with repolarization abnormality.

## 2016-04-05 NOTE — Assessment & Plan Note (Signed)
Patient is euvolemic. Continue present dose of Lasix.Potassium and renal function monitored by primary care.

## 2016-04-05 NOTE — Assessment & Plan Note (Signed)
Resume Lipitor 80 mg daily. Check lipids and liver in 4 weeks.

## 2016-04-05 NOTE — Patient Instructions (Signed)
Medication Instructions:   START ATORVASTATIN 80 MG ONCE DAILY  Labwork:  Your physician recommends that you return for lab work in: 4 WEEKS= DO NOT EAT PRIOR TO LAB WORK  Follow-Up:  Your physician wants you to follow-up in: [place] will receive a reminder letter in the mail two months in advance. If you don't receive a letter, please call our office to schedule the follow-up appointment.   If you need a refill on your cardiac medications before your next appointment, please call your pharmacy.

## 2016-04-05 NOTE — Assessment & Plan Note (Addendum)
Continue aspirin. Resume statin. Patient will be considered for renal transplant. Have asked him to contact us if he requires cardiac testing prior to being listed. We would then arrange an echo and a nuclear study.

## 2016-04-11 ENCOUNTER — Ambulatory Visit (INDEPENDENT_AMBULATORY_CARE_PROVIDER_SITE_OTHER): Payer: Medicare Other | Admitting: Family Medicine

## 2016-04-11 VITALS — BP 126/52 | HR 79 | Wt 234.0 lb

## 2016-04-11 DIAGNOSIS — R768 Other specified abnormal immunological findings in serum: Secondary | ICD-10-CM

## 2016-04-11 DIAGNOSIS — E1122 Type 2 diabetes mellitus with diabetic chronic kidney disease: Secondary | ICD-10-CM | POA: Diagnosis not present

## 2016-04-11 DIAGNOSIS — M1000 Idiopathic gout, unspecified site: Secondary | ICD-10-CM

## 2016-04-11 DIAGNOSIS — N184 Chronic kidney disease, stage 4 (severe): Secondary | ICD-10-CM

## 2016-04-11 DIAGNOSIS — I2510 Atherosclerotic heart disease of native coronary artery without angina pectoris: Secondary | ICD-10-CM | POA: Diagnosis not present

## 2016-04-11 DIAGNOSIS — I10 Essential (primary) hypertension: Secondary | ICD-10-CM

## 2016-04-11 NOTE — Progress Notes (Signed)
Subjective:    Patient ID: Louis [name], male    DOB: October 28, 1946, 70 y.o.   MRN: [id number]  HPI Gout - He is taking Allopurinol.   He is now taking 600 mg twice a day. He is also started some over-the-counter herbal medications from [employer]. He started dose about 3 weeks ago and says he has noticed a big difference in his pain with the addition of the over-the-counter. In the past he was taking 4 ounces of cherry juice a day and that significantly improved his pain levels but it was affecting his blood sugars for his diabetes so he had to stop it. Next  He did go see rheumatology. I referred him because his ANA was elevated. At that time they felt like his pain was mostly from his gout and osteoporosis and rotator cuff issues with his shoulders. They did do some additional blood work.  They're now talking about possible kidney transplant for his renal failure. He did see [name] and was approved for surgery.  Review of Systems  BP 126/52 mmHg  Pulse 79  Wt 234 lb (106.142 kg)  SpO2 97%    Allergies  Allergen Reactions  . Hydrocodone Nausea And Vomiting  . Oxycodone Nausea And Vomiting    Past Medical History  Diagnosis Date  . Diabetes mellitus   . Hypertension   . Hyperlipidemia   . OSA on CPAP   . Renal insufficiency   . CAD (coronary artery disease)   . Personal history of colonic polyps - adenomas 01/28/2014  . Heart failure, diastolic ([place])   . Shortness of breath dyspnea     Past Surgical History  Procedure Laterality Date  . Pilonidal cyst excision    . Carpal tunnel release      Social History   Social History  . Marital Status: Married    Spouse Name: N/A  . Number of Children: 5  . Years of Education: N/A   Occupational History  . Warehouse    Social History Main Topics  . Smoking status: Former Smoker -- 1.00 packs/day for 20 years    Types: Cigarettes    Quit date: 03/06/1977  . Smokeless tobacco: Never Used  . Alcohol Use:  No  . Drug Use: No  . Sexual Activity: Not on file   Other Topics Concern  . Not on file   Social History Narrative    Family History  Problem Relation Age of Onset  . Diabetes Mother   . Coronary artery disease Father 64    CABG  . Diabetes Father   . Colon cancer Neg Hx     Outpatient Encounter Prescriptions as of 04/11/2016  Medication Sig  . allopurinol (ZYLOPRIM) 300 MG tablet Take 2 tablets (600 mg total) by mouth 2 (two) times daily.  Marland Kitchen aspirin 81 MG tablet Take 81 mg by mouth daily.    Marland Kitchen atorvastatin (LIPITOR) 80 MG tablet Take 1 tablet (80 mg total) by mouth daily.  . cloNIDine (CATAPRES) 0.2 MG tablet TAKE 1 TABLET (0.2 MG TOTAL) BY MOUTH DAILY. PLEASE SCHEDULE APPOINTMENT FOR REFILLS.  . furosemide (LASIX) 80 MG tablet Take 1 tablet by mouth 2 (two) times daily.   . hydrALAZINE (APRESOLINE) 100 MG tablet Take 1 tablet (100 mg total) by mouth 3 (three) times daily.  . isosorbide mononitrate (ISMO,MONOKET) 20 MG tablet Take 1 tablet (20 mg total) by mouth 3 (three) times daily. NEED OV.  . metoprolol succinate (TOPROL-XL) 100 MG  24 hr tablet Take 100 mg by mouth daily.  . nitroGLYCERIN (NITROSTAT) 0.4 MG SL tablet Place 1 tablet (0.4 mg total) under the tongue every 5 (five) minutes as needed for chest pain.  Marland Kitchen NOVOLIN N 100 UNIT/ML injection INJECT 70 UNITS EVERY MORNING AND AND 74 UNITS AT EVERY EVENING  . NOVOLIN R 100 UNIT/ML injection INJECT 50 UNITS WITH EACH MEAL  . triamcinolone ointment (KENALOG) 0.5 % Apply 1 application topically 2 (two) times daily. To affected area, avoid eyes and face  . [DISCONTINUED] AMBULATORY NON FORMULARY MEDICATION Medication Name: Tdap IM injection x 1  . [DISCONTINUED] ciprofloxacin (CIPRO) 500 MG tablet Take 1 tablet (500 mg total) by mouth 2 (two) times daily.  . [DISCONTINUED] colchicine 0.6 MG tablet Take 1 tablet (0.6 mg total) by mouth daily as needed. For Gout Flare.  Can substitute generic or brand, which ever is cheaper.  .  [DISCONTINUED] NON FORMULARY    No facility-administered encounter medications on file as of 04/11/2016.          Objective:   Physical Exam  Constitutional: He is oriented to person, place, and time. He appears well-developed and well-nourished.  HENT:  Head: Normocephalic and atraumatic.  Cardiovascular: Normal rate, regular rhythm and normal heart sounds.   Pulmonary/Chest: Effort normal and breath sounds normal.  Neurological: He is alert and oriented to person, place, and time.  Skin: Skin is warm and dry.  Psychiatric: He has a normal mood and affect. His behavior is normal.        Assessment & Plan:  Gout -we discussed several options. He might be a good candidate for the addition of probenecid. There is a new medication on the market called Zurampic.  We could also consider switching to you lower. We had tried this in the past but unfortunately was not covered by his insurance plan but we could consider asking for a tear extension. We will start today just by rechecking his level and then making a decision from there but we certainly discussed several options that would be available for future.  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    Elevated ANA - will await notes from Dr. Barkley Boards. Rheumatology.

## 2016-04-11 NOTE — Patient Instructions (Signed)
Can see if insurance will cover Zurampic or even Uloric.

## 2016-04-12 LAB — LIPID PANEL
CHOLESTEROL: 143 mg/dL (ref 125–200)
HDL: 37 mg/dL — AB (ref >=40)
LDL Cholesterol: 78 mg/dL (ref <130)
TRIGLYCERIDES: 140 mg/dL (ref <150)
Total CHOL/HDL Ratio: 3.9 Ratio (ref <=5.0)
VLDL: 28 mg/dL (ref <30)

## 2016-04-12 LAB — URIC ACID: URIC ACID, SERUM: 6 mg/dL (ref 4.0–7.8)

## 2016-04-13 DIAGNOSIS — E113212 Type 2 diabetes mellitus with mild nonproliferative diabetic retinopathy with macular edema, left eye: Secondary | ICD-10-CM | POA: Diagnosis not present

## 2016-04-21 ENCOUNTER — Encounter: Payer: Self-pay | Admitting: Family Medicine

## 2016-04-25 DIAGNOSIS — M150 Primary generalized (osteo)arthritis: Secondary | ICD-10-CM | POA: Diagnosis not present

## 2016-04-25 DIAGNOSIS — M1009 Idiopathic gout, multiple sites: Secondary | ICD-10-CM | POA: Diagnosis not present

## 2016-05-08 ENCOUNTER — Other Ambulatory Visit: Payer: Self-pay | Admitting: Cardiology

## 2016-05-08 NOTE — Telephone Encounter (Signed)
Rx(s) sent to pharmacy electronically.  

## 2016-05-12 ENCOUNTER — Ambulatory Visit (INDEPENDENT_AMBULATORY_CARE_PROVIDER_SITE_OTHER): Payer: Medicare Other | Admitting: Sports Medicine

## 2016-05-12 ENCOUNTER — Encounter: Payer: Self-pay | Admitting: Sports Medicine

## 2016-05-12 ENCOUNTER — Ambulatory Visit (INDEPENDENT_AMBULATORY_CARE_PROVIDER_SITE_OTHER): Payer: Medicare Other

## 2016-05-12 VITALS — BP 123/68 | HR 59 | Resp 16 | Wt 233.7 lb

## 2016-05-12 DIAGNOSIS — M5032 Other cervical disc degeneration, mid-cervical region, unspecified level: Secondary | ICD-10-CM

## 2016-05-12 DIAGNOSIS — M5412 Radiculopathy, cervical region: Secondary | ICD-10-CM

## 2016-05-12 DIAGNOSIS — I2510 Atherosclerotic heart disease of native coronary artery without angina pectoris: Secondary | ICD-10-CM

## 2016-05-12 DIAGNOSIS — M4802 Spinal stenosis, cervical region: Secondary | ICD-10-CM

## 2016-05-12 DIAGNOSIS — M50322 Other cervical disc degeneration at C5-C6 level: Secondary | ICD-10-CM | POA: Diagnosis not present

## 2016-05-12 MED ORDER — GABAPENTIN 300 MG PO CAPS: 300.0000 mg | ORAL_CAPSULE | Freq: Every day | ORAL | Status: DC

## 2016-05-12 MED ORDER — KETOROLAC TROMETHAMINE 30 MG/ML IJ SOLN
30.0000 mg | Freq: Once | INTRAMUSCULAR | Status: AC
  Administered 2016-05-12: 30 mg via INTRAMUSCULAR

## 2016-05-12 MED ORDER — TRAMADOL HCL 50 MG PO TABS: ORAL_TABLET | ORAL | Status: DC

## 2016-05-12 MED ORDER — PREDNISONE 50 MG PO TABS: ORAL_TABLET | ORAL | Status: DC

## 2016-05-12 NOTE — Progress Notes (Signed)
  Subjective:    CC: Right forearm and hand pain  HPI: For several weeks this pleasant 70 year old male has had severe pain that he localizes along the medial elbow, radiating to the second, third, and fourth fingers. Worse at night with numbing, and a sensation of stabbing. Symptoms are severe, persistent, makes it difficult for him to sleep. Minimal neck pain, does have a history of carpal tunnel syndrome, this feels different.  Past medical history, Surgical history, Family history not pertinant except as noted below, Social history, Allergies, and medications have been entered into the medical record, reviewed, and no changes needed.   Review of Systems: No fevers, chills, night sweats, weight loss, chest pain, or shortness of breath.   Objective:    General: Well Developed, well nourished, and in no acute distress.  Neuro: Alert and oriented x3, extra-ocular muscles intact, sensation grossly intact.  HEENT: Normocephalic, atraumatic, pupils equal round reactive to light, neck supple, no masses, no lymphadenopathy, thyroid nonpalpable.  Skin: Warm and dry, no rashes. Cardiac: Regular rate and rhythm, no murmurs rubs or gallops, no lower extremity edema.  Respiratory: Clear to auscultation bilaterally. Not using accessory muscles, speaking in full sentences. Right Wrist: Inspection normal with no visible erythema or swelling. ROM smooth and normal with good flexion and extension and ulnar/radial deviation that is symmetrical with opposite wrist. Palpation is normal over metacarpals, navicular, lunate, and TFCC; tendons without tenderness/ swelling No snuffbox tenderness. No tenderness over Canal of [place]. Strength 5/5 in all directions without pain. Negative Finkelstein, tinel's and phalens. Negative Watson's test.  Impression and Recommendations:    I spent 25 minutes with this patient, greater than 50% was face-to-face time counseling regarding the above diagnoses

## 2016-05-12 NOTE — Addendum Note (Signed)
Addended by: Elizabeth Sauer on: 05/12/2016 03:24 PM   Modules accepted: Orders

## 2016-05-12 NOTE — Assessment & Plan Note (Signed)
Seems to be right C7 distribution. Continue with chiropractic care, adding prednisone, Toradol 30 intramuscular, x-rays, gabapentin at bedtime. Return to see me in one month, MRI for intervention if no better. Wrist exam was unremarkable, I don't think this is a manifestation of his carpal tunnel syndrome.

## 2016-05-18 DIAGNOSIS — H2511 Age-related nuclear cataract, right eye: Secondary | ICD-10-CM | POA: Diagnosis not present

## 2016-05-18 DIAGNOSIS — E113213 Type 2 diabetes mellitus with mild nonproliferative diabetic retinopathy with macular edema, bilateral: Secondary | ICD-10-CM | POA: Diagnosis not present

## 2016-05-18 DIAGNOSIS — H269 Unspecified cataract: Secondary | ICD-10-CM | POA: Insufficient documentation

## 2016-05-22 ENCOUNTER — Telehealth: Payer: Self-pay

## 2016-05-22 MED ORDER — BLOOD GLUCOSE METER KIT: PACK | Status: DC

## 2016-05-22 NOTE — Telephone Encounter (Signed)
Sent in generic glucometer to [facility] [address]. Patient states he has a One Touch Ultra meter. I wrote on bottom of prescription.

## 2016-05-30 DIAGNOSIS — S92334K Nondisplaced fracture of third metatarsal bone, right foot, subsequent encounter for fracture with nonunion: Secondary | ICD-10-CM | POA: Diagnosis not present

## 2016-05-30 DIAGNOSIS — S92351K Displaced fracture of fifth metatarsal bone, right foot, subsequent encounter for fracture with nonunion: Secondary | ICD-10-CM | POA: Diagnosis not present

## 2016-05-30 DIAGNOSIS — S92021K Displaced fracture of anterior process of right calcaneus, subsequent encounter for fracture with nonunion: Secondary | ICD-10-CM | POA: Diagnosis not present

## 2016-05-30 DIAGNOSIS — S92344K Nondisplaced fracture of fourth metatarsal bone, right foot, subsequent encounter for fracture with nonunion: Secondary | ICD-10-CM | POA: Diagnosis not present

## 2016-06-01 DIAGNOSIS — I129 Hypertensive chronic kidney disease with stage 1 through stage 4 chronic kidney disease, or unspecified chronic kidney disease: Secondary | ICD-10-CM | POA: Diagnosis not present

## 2016-06-01 DIAGNOSIS — M109 Gout, unspecified: Secondary | ICD-10-CM | POA: Diagnosis not present

## 2016-06-01 DIAGNOSIS — D631 Anemia in chronic kidney disease: Secondary | ICD-10-CM | POA: Diagnosis not present

## 2016-06-01 DIAGNOSIS — N2581 Secondary hyperparathyroidism of renal origin: Secondary | ICD-10-CM | POA: Diagnosis not present

## 2016-06-01 DIAGNOSIS — E669 Obesity, unspecified: Secondary | ICD-10-CM | POA: Diagnosis not present

## 2016-06-01 DIAGNOSIS — E0822 Diabetes mellitus due to underlying condition with diabetic chronic kidney disease: Secondary | ICD-10-CM | POA: Diagnosis not present

## 2016-06-01 DIAGNOSIS — R809 Proteinuria, unspecified: Secondary | ICD-10-CM | POA: Diagnosis not present

## 2016-06-01 DIAGNOSIS — N184 Chronic kidney disease, stage 4 (severe): Secondary | ICD-10-CM | POA: Diagnosis not present

## 2016-06-08 ENCOUNTER — Encounter: Payer: Self-pay | Admitting: Family Medicine

## 2016-06-08 ENCOUNTER — Encounter: Payer: Self-pay | Admitting: Sports Medicine

## 2016-06-08 ENCOUNTER — Ambulatory Visit (INDEPENDENT_AMBULATORY_CARE_PROVIDER_SITE_OTHER): Payer: Medicare Other | Admitting: Family Medicine

## 2016-06-08 ENCOUNTER — Ambulatory Visit (INDEPENDENT_AMBULATORY_CARE_PROVIDER_SITE_OTHER): Payer: Medicare Other | Admitting: Sports Medicine

## 2016-06-08 VITALS — BP 113/53 | HR 52 | Wt 231.0 lb

## 2016-06-08 VITALS — BP 113/53 | HR 52 | Resp 18 | Wt 231.0 lb

## 2016-06-08 DIAGNOSIS — E1122 Type 2 diabetes mellitus with diabetic chronic kidney disease: Secondary | ICD-10-CM

## 2016-06-08 DIAGNOSIS — N184 Chronic kidney disease, stage 4 (severe): Secondary | ICD-10-CM

## 2016-06-08 DIAGNOSIS — I2510 Atherosclerotic heart disease of native coronary artery without angina pectoris: Secondary | ICD-10-CM | POA: Diagnosis not present

## 2016-06-08 DIAGNOSIS — M5412 Radiculopathy, cervical region: Secondary | ICD-10-CM | POA: Diagnosis not present

## 2016-06-08 DIAGNOSIS — N5203 Combined arterial insufficiency and corporo-venous occlusive erectile dysfunction: Secondary | ICD-10-CM | POA: Diagnosis not present

## 2016-06-08 DIAGNOSIS — I10 Essential (primary) hypertension: Secondary | ICD-10-CM

## 2016-06-08 DIAGNOSIS — M1A09X0 Idiopathic chronic gout, multiple sites, without tophus (tophi): Secondary | ICD-10-CM | POA: Diagnosis not present

## 2016-06-08 LAB — URIC ACID: URIC ACID, SERUM: 12.9 mg/dL — AB (ref 4.0–8.0)

## 2016-06-08 NOTE — Progress Notes (Signed)
  Subjective:    CC: Follow-up  HPI: Right cervical radiculopathy: Resolved with conservative measures.  Past medical history, Surgical history, Family history not pertinant except as noted below, Social history, Allergies, and medications have been entered into the medical record, reviewed, and no changes needed.   Review of Systems: No fevers, chills, night sweats, weight loss, chest pain, or shortness of breath.   Objective:    General: Well Developed, well nourished, and in no acute distress.  Neuro: Alert and oriented x3, extra-ocular muscles intact, sensation grossly intact.  HEENT: Normocephalic, atraumatic, pupils equal round reactive to light, neck supple, no masses, no lymphadenopathy, thyroid nonpalpable.  Skin: Warm and dry, no rashes. Cardiac: Regular rate and rhythm, no murmurs rubs or gallops, no lower extremity edema.  Respiratory: Clear to auscultation bilaterally. Not using accessory muscles, speaking in full sentences.  Impression and Recommendations:

## 2016-06-08 NOTE — Assessment & Plan Note (Signed)
Resolved with chiropractic manipulation, prednisone, Toradol. Gabapentin. He will continue this, and we can add more prednisone or Toradol if he has a flare. Not hurting enough to consider MRI/epidural.

## 2016-06-08 NOTE — Progress Notes (Addendum)
Subjective:    CC: gout  HPI:  HTN - his Nephrologist stopped hi clonidine bc BPs were running low.    Gout - last uric acid was down to 6.  Had discussed previously possibly starting probenecid. He actually decided on his own to stop his allopurinol and started taking 3 over-the-counter supplements. One is called gout out, the second is saflower, and finally Uvaurisi.  He said he did mention these to his nephrologist. He has not had any flares since I last saw him and feels like his gout is under good control on the supplements.  ED-  He does complain of erectile dysfunction. He is here today with his wife. He wanted to know if he would be a candidate for any of the pills. He said he had mentioned it to his nephrologist who thought that he probably wasn't going to be candidate for those. He did fill out an EDD questionnaire for me today.He has difficulty initiating and maintaining erection.  Diabetes-overall he is actually doing much better and feeling much better. He came to find out that his glucometer was off by about 40-50 points. He tried to get in touch with Dr. Tod Persia who is his endocrinologist but had poor success in getting someone from their office to send in a new meter. He called here and we took care of it for him. He was able to get new supplies and feels like he is back on track.  Past medical history, Surgical history, Family history not pertinant except as noted below, Social history, Allergies, and medications have been entered into the medical record, reviewed, and corrections made.   Review of Systems: No fevers, chills, night sweats, weight loss, chest pain, or shortness of breath.   Objective:    General: Well Developed, well nourished, and in no acute distress.  Neuro: Alert and oriented x3, extra-ocular muscles intact, sensation grossly intact.  HEENT: Normocephalic, atraumatic  Skin: Warm and dry, no rashes. Cardiac: Regular rate and rhythm, no murmurs rubs or  gallops, no lower extremity edema.  Respiratory: Clear to auscultation bilaterally. Not using accessory muscles, speaking in full sentences.   Impression and Recommendations:   Hypertension-blood pressure looks fantastic today. In fact it's a little bit on the lower side.   Gout - will recheck his uric acid level now that he is off of his allopurinol.  Diabetes-follows with Dr. Tod Persia. He does have up-to-date supplies. We do have his last A1c of 5.6 on file from March.  Erectile dysfunction-we did discuss today that he is not a candidate for the viagra type products.  We discussed that this is contraindicated with his isosorbide mononitrate. Explained how this can precipitously drop blood pressure and cause harm. We did discuss a penis home/vacuum erection system versus penile injections. I did give him a handout with some additional information. Certainly if he wants to do the injections were is not satisfied with the palm I would be happy to refer him to urology to discuss further treatment options.ED score of 8 today which is significant. Also had him do a low testosterone questionnaire and he scored positive for 7 out of 10 questions.

## 2016-06-15 ENCOUNTER — Ambulatory Visit (INDEPENDENT_AMBULATORY_CARE_PROVIDER_SITE_OTHER): Payer: Medicare Other | Admitting: Sports Medicine

## 2016-06-15 ENCOUNTER — Encounter: Payer: Self-pay | Admitting: Sports Medicine

## 2016-06-15 DIAGNOSIS — M5412 Radiculopathy, cervical region: Secondary | ICD-10-CM | POA: Diagnosis not present

## 2016-06-15 DIAGNOSIS — M1000 Idiopathic gout, unspecified site: Secondary | ICD-10-CM

## 2016-06-15 DIAGNOSIS — I2510 Atherosclerotic heart disease of native coronary artery without angina pectoris: Secondary | ICD-10-CM

## 2016-06-15 MED ORDER — GABAPENTIN 300 MG PO CAPS: 300.0000 mg | ORAL_CAPSULE | Freq: Two times a day (BID) | ORAL | 3 refills | Status: DC

## 2016-06-15 MED ORDER — COLCHICINE 0.6 MG PO TABS: 0.6000 mg | ORAL_TABLET | Freq: Two times a day (BID) | ORAL | 3 refills | Status: DC

## 2016-06-15 MED ORDER — TRAMADOL HCL 50 MG PO TABS: ORAL_TABLET | ORAL | 0 refills | Status: DC

## 2016-06-15 NOTE — Progress Notes (Signed)
  Subjective:    CC: Gout flare  HPI: [name], he recently went off of his allopurinol and uric acid levels increased to 12, now having of widespread flare, worst in the left wrist. Symptoms are severe, persistent. No constitutional symptoms.  Past medical history, Surgical history, Family history not pertinant except as noted below, Social history, Allergies, and medications have been entered into the medical record, reviewed, and no changes needed.   Review of Systems: No fevers, chills, night sweats, weight loss, chest pain, or shortness of breath.   Objective:    General: Well Developed, well nourished, and in no acute distress.  Neuro: Alert and oriented x3, extra-ocular muscles intact, sensation grossly intact.  HEENT: Normocephalic, atraumatic, pupils equal round reactive to light, neck supple, no masses, no lymphadenopathy, thyroid nonpalpable.  Skin: Warm and dry, no rashes. Cardiac: Regular rate and rhythm, no murmurs rubs or gallops, no lower extremity edema.  Respiratory: Clear to auscultation bilaterally. Not using accessory muscles, speaking in full sentences. Left wrist: Swollen, slightly red, tender to palpation and warm.  Procedure: Real-time Ultrasound Guided Injection of left radiocarpal joint Device: GE Logiq E  Verbal informed consent obtained.  Time-out conducted.  Noted no overlying erythema, induration, or other signs of local infection.  Skin prepped in a sterile fashion.  Local anesthesia: Topical Ethyl chloride.  With sterile technique and under real time ultrasound guidance:  25-gauge needle advanced into joint and 1 mL kenalog 40, 1 mL lidocaine, 1 mL Marcaine injected easily. Completed without difficulty  Pain immediately resolved suggesting accurate placement of the medication.  Advised to call if fevers/chills, erythema, induration, drainage, or persistent bleeding.  Images permanently stored and available for review in the  ultrasound unit.  Impression: Technically successful ultrasound guided injection.  Impression and Recommendations:

## 2016-06-15 NOTE — Assessment & Plan Note (Signed)
Recently came off of allopurinol, uric acid levels rose to 12. He is back on it but in acute flare, predominantly in the left wrist which was injected today. Adding colchicine, return to see me in one month. We will probably discontinue colchicine at the one-month follow-up.

## 2016-06-29 DIAGNOSIS — E113212 Type 2 diabetes mellitus with mild nonproliferative diabetic retinopathy with macular edema, left eye: Secondary | ICD-10-CM | POA: Diagnosis not present

## 2016-07-03 ENCOUNTER — Other Ambulatory Visit: Payer: Self-pay | Admitting: Sports Medicine

## 2016-07-03 DIAGNOSIS — M1000 Idiopathic gout, unspecified site: Secondary | ICD-10-CM | POA: Diagnosis not present

## 2016-07-03 LAB — URIC ACID: Uric Acid, Serum: 2.4 mg/dL — ABNORMAL LOW (ref 4.0–8.0)

## 2016-07-05 ENCOUNTER — Ambulatory Visit (INDEPENDENT_AMBULATORY_CARE_PROVIDER_SITE_OTHER): Payer: Medicare Other | Admitting: Sports Medicine

## 2016-07-05 ENCOUNTER — Ambulatory Visit (INDEPENDENT_AMBULATORY_CARE_PROVIDER_SITE_OTHER): Payer: Medicare Other

## 2016-07-05 ENCOUNTER — Encounter: Payer: Self-pay | Admitting: Sports Medicine

## 2016-07-05 DIAGNOSIS — M1000 Idiopathic gout, unspecified site: Secondary | ICD-10-CM

## 2016-07-05 DIAGNOSIS — I2510 Atherosclerotic heart disease of native coronary artery without angina pectoris: Secondary | ICD-10-CM

## 2016-07-05 DIAGNOSIS — M25561 Pain in right knee: Secondary | ICD-10-CM | POA: Diagnosis not present

## 2016-07-05 DIAGNOSIS — M25562 Pain in left knee: Secondary | ICD-10-CM | POA: Diagnosis not present

## 2016-07-05 DIAGNOSIS — M11261 Other chondrocalcinosis, right knee: Secondary | ICD-10-CM

## 2016-07-05 DIAGNOSIS — M179 Osteoarthritis of knee, unspecified: Secondary | ICD-10-CM | POA: Diagnosis not present

## 2016-07-05 DIAGNOSIS — M11262 Other chondrocalcinosis, left knee: Secondary | ICD-10-CM | POA: Diagnosis not present

## 2016-07-05 NOTE — Assessment & Plan Note (Signed)
Flare in the left wrist has resolved after injection 3 weeks ago. Unfortunately having bilateral knee effusions, unclear as to whether this represents a gout flare not. Aspiration and injection of both knees, return in one month. Fluid sent off for crystal analysis.

## 2016-07-05 NOTE — Progress Notes (Signed)
  Subjective:    CC: Bilateral knee pain  HPI: [name] with gout, we injected his wrist approximately 3 weeks ago needed extremely well, unfortunately for the past several days has had increasing pain and swelling in both knees with warmth. Pain is severe, worsening. Swelling is moderate. No mechanical symptoms, no trauma, no constitutional symptoms.  Past medical history, Surgical history, Family history not pertinant except as noted below, Social history, Allergies, and medications have been entered into the medical record, reviewed, and no changes needed.   Review of Systems: No fevers, chills, night sweats, weight loss, chest pain, or shortness of breath.   Objective:    General: Well Developed, well nourished, and in no acute distress.  Neuro: Alert and oriented x3, extra-ocular muscles intact, sensation grossly intact.  HEENT: Normocephalic, atraumatic, pupils equal round reactive to light, neck supple, no masses, no lymphadenopathy, thyroid nonpalpable.  Skin: Warm and dry, no rashes. Cardiac: Regular rate and rhythm, no murmurs rubs or gallops, no lower extremity edema.  Respiratory: Clear to auscultation bilaterally. Not using accessory muscles, speaking in full sentences. Knees: Tender to palpation at the joint lines, visibly swollen with a palpable effusion and fluid wave, good range of motion but somewhat limited by swelling. Good strength.  Procedure: Real-time Ultrasound Guided aspiration/injection of right knee Device: GE Logiq E  Verbal informed consent obtained.  Time-out conducted.  Noted no overlying erythema, induration, or other signs of local infection.  Skin prepped in a sterile fashion.  Local anesthesia: Topical Ethyl chloride.  With sterile technique and under real time ultrasound guidance:  Aspirated 24 mL of straw-colored cloudy fluid, syringe switched and 1 mL kenalog 40, 2 mL lidocaine, 2 mL Marcaine injected easily. Completed  without difficulty  Pain immediately resolved suggesting accurate placement of the medication.  Advised to call if fevers/chills, erythema, induration, drainage, or persistent bleeding.  Images permanently stored and available for review in the ultrasound unit.  Impression: Technically successful ultrasound guided injection.  Procedure: Real-time Ultrasound Guided aspiration/injection of left knee Device: GE Logiq E  Verbal informed consent obtained.  Time-out conducted.  Noted no overlying erythema, induration, or other signs of local infection.  Skin prepped in a sterile fashion.  Local anesthesia: Topical Ethyl chloride.  With sterile technique and under real time ultrasound guidance:  Aspirated 33 mL of straw-colored cloudy fluid, syringe switched and 1 mL kenalog 40, 2 mL lidocaine, 2 mL Marcaine injected easily. Completed without difficulty  Pain immediately resolved suggesting accurate placement of the medication.  Advised to call if fevers/chills, erythema, induration, drainage, or persistent bleeding.  Images permanently stored and available for review in the ultrasound unit.  Impression: Technically successful ultrasound guided injection.  Impression and Recommendations:    Gout Flare in the left wrist has resolved after injection 3 weeks ago. Unfortunately having bilateral knee effusions, unclear as to whether this represents a gout flare not. Aspiration and injection of both knees, return in one month. Fluid sent off for crystal analysis.

## 2016-07-06 DIAGNOSIS — E113211 Type 2 diabetes mellitus with mild nonproliferative diabetic retinopathy with macular edema, right eye: Secondary | ICD-10-CM | POA: Diagnosis not present

## 2016-07-06 LAB — SYNOVIAL CELL COUNT + DIFF, W/ CRYSTALS
Basophils, %: 0 %
Eosinophils-Synovial: 0 % (ref 0–2)
Lymphocytes-Synovial Fld: 2 % (ref 0–74)
Monocyte/Macrophage: 29 % (ref 0–69)
Neutrophil, Synovial: 68 % — ABNORMAL HIGH (ref 0–24)
Synoviocytes, %: 1 % (ref 0–15)
WBC, Synovial: 2930 cells/uL — ABNORMAL HIGH (ref <150)

## 2016-07-13 ENCOUNTER — Ambulatory Visit: Payer: Medicare Other | Admitting: Sports Medicine

## 2016-07-31 ENCOUNTER — Encounter: Payer: Self-pay | Admitting: Sports Medicine

## 2016-07-31 ENCOUNTER — Ambulatory Visit (INDEPENDENT_AMBULATORY_CARE_PROVIDER_SITE_OTHER): Payer: Medicare Other | Admitting: Sports Medicine

## 2016-07-31 DIAGNOSIS — M25561 Pain in right knee: Secondary | ICD-10-CM | POA: Diagnosis not present

## 2016-07-31 DIAGNOSIS — I2510 Atherosclerotic heart disease of native coronary artery without angina pectoris: Secondary | ICD-10-CM | POA: Diagnosis not present

## 2016-07-31 NOTE — Assessment & Plan Note (Signed)
Known right knee osteoarthritis, persistent pain with mechanical symptoms, failed injection previously, suspect meniscal tear, he will need an arthroscopy at this point since he is failed rehabilitation and injections, ordering an MRI. Also referral to orthopedics After arthroscopy we will consider proceeding with viscosupplementation.

## 2016-07-31 NOTE — Progress Notes (Signed)
  Subjective:    CC: Right knee pain  HPI: [name], we injected his knees about 3 weeks ago, he did well but unfortunately started to have a recurrence of pain along the medial joint line with catching and locking. Minimal effusion today. We did get some uric acid crystals from his arthrocentesis at the last visit. He does have known osteoarthritis on x-rays.  Past medical history, Surgical history, Family history not pertinant except as noted below, Social history, Allergies, and medications have been entered into the medical record, reviewed, and no changes needed.   Review of Systems: No fevers, chills, night sweats, weight loss, chest pain, or shortness of breath.   Objective:    General: Well Developed, well nourished, and in no acute distress.  Neuro: Alert and oriented x3, extra-ocular muscles intact, sensation grossly intact.  HEENT: Normocephalic, atraumatic, pupils equal round reactive to light, neck supple, no masses, no lymphadenopathy, thyroid nonpalpable.  Skin: Warm and dry, no rashes. Cardiac: Regular rate and rhythm, no murmurs rubs or gallops, no lower extremity edema.  Respiratory: Clear to auscultation bilaterally. Not using accessory muscles, speaking in full sentences. Right Knee: Normal to inspection with no erythema or effusion or obvious bony abnormalities. Tender to palpation at the medial joint line with reproduction of pain with flexion past 90. ROM normal in flexion and extension and lower leg rotation. Ligaments with solid consistent endpoints including ACL, PCL, LCL, MCL. Negative Mcmurray's and provocative meniscal tests. Non painful patellar compression. Patellar and quadriceps tendons unremarkable. Hamstring and quadriceps strength is normal.  Impression and Recommendations:    Right knee pain Known right knee osteoarthritis, persistent pain with mechanical symptoms, failed injection previously, suspect meniscal tear, he  will need an arthroscopy at this point since he is failed rehabilitation and injections, ordering an MRI. Also referral to orthopedics After arthroscopy we will consider proceeding with viscosupplementation.  I spent 25 minutes with this patient, greater than 50% was face-to-face time counseling regarding the above diagnoses

## 2016-08-03 DIAGNOSIS — R809 Proteinuria, unspecified: Secondary | ICD-10-CM | POA: Diagnosis not present

## 2016-08-03 DIAGNOSIS — N2581 Secondary hyperparathyroidism of renal origin: Secondary | ICD-10-CM | POA: Diagnosis not present

## 2016-08-03 DIAGNOSIS — I129 Hypertensive chronic kidney disease with stage 1 through stage 4 chronic kidney disease, or unspecified chronic kidney disease: Secondary | ICD-10-CM | POA: Diagnosis not present

## 2016-08-03 DIAGNOSIS — E0822 Diabetes mellitus due to underlying condition with diabetic chronic kidney disease: Secondary | ICD-10-CM | POA: Diagnosis not present

## 2016-08-03 DIAGNOSIS — E669 Obesity, unspecified: Secondary | ICD-10-CM | POA: Diagnosis not present

## 2016-08-03 DIAGNOSIS — M109 Gout, unspecified: Secondary | ICD-10-CM | POA: Diagnosis not present

## 2016-08-03 DIAGNOSIS — N184 Chronic kidney disease, stage 4 (severe): Secondary | ICD-10-CM | POA: Diagnosis not present

## 2016-08-03 DIAGNOSIS — D631 Anemia in chronic kidney disease: Secondary | ICD-10-CM | POA: Diagnosis not present

## 2016-08-04 ENCOUNTER — Ambulatory Visit: Payer: Medicare Other | Admitting: Sports Medicine

## 2016-08-07 ENCOUNTER — Ambulatory Visit (INDEPENDENT_AMBULATORY_CARE_PROVIDER_SITE_OTHER): Payer: Medicare Other

## 2016-08-07 DIAGNOSIS — M25561 Pain in right knee: Secondary | ICD-10-CM

## 2016-08-07 DIAGNOSIS — M23221 Derangement of posterior horn of medial meniscus due to old tear or injury, right knee: Secondary | ICD-10-CM

## 2016-08-07 DIAGNOSIS — M1711 Unilateral primary osteoarthritis, right knee: Secondary | ICD-10-CM | POA: Diagnosis not present

## 2016-08-08 ENCOUNTER — Other Ambulatory Visit: Payer: Self-pay | Admitting: Cardiology

## 2016-08-08 NOTE — Telephone Encounter (Signed)
Rx request sent to pharmacy.  

## 2016-08-10 DIAGNOSIS — E113212 Type 2 diabetes mellitus with mild nonproliferative diabetic retinopathy with macular edema, left eye: Secondary | ICD-10-CM | POA: Diagnosis not present

## 2016-08-17 DIAGNOSIS — S83206A Unspecified tear of unspecified meniscus, current injury, right knee, initial encounter: Secondary | ICD-10-CM | POA: Diagnosis not present

## 2016-08-17 DIAGNOSIS — M25561 Pain in right knee: Secondary | ICD-10-CM | POA: Diagnosis not present

## 2016-08-21 ENCOUNTER — Telehealth: Payer: Self-pay | Admitting: *Deleted

## 2016-08-21 NOTE — Telephone Encounter (Signed)
Okay for right knee arthroscopy and continue present medications pre and post op faxed to the number provided

## 2016-08-29 ENCOUNTER — Encounter: Payer: Self-pay | Admitting: Family Medicine

## 2016-08-29 DIAGNOSIS — B353 Tinea pedis: Secondary | ICD-10-CM | POA: Diagnosis not present

## 2016-08-31 DIAGNOSIS — E113211 Type 2 diabetes mellitus with mild nonproliferative diabetic retinopathy with macular edema, right eye: Secondary | ICD-10-CM | POA: Diagnosis not present

## 2016-09-05 DIAGNOSIS — E1129 Type 2 diabetes mellitus with other diabetic kidney complication: Secondary | ICD-10-CM | POA: Diagnosis not present

## 2016-09-05 DIAGNOSIS — Z794 Long term (current) use of insulin: Secondary | ICD-10-CM | POA: Diagnosis not present

## 2016-09-05 DIAGNOSIS — I10 Essential (primary) hypertension: Secondary | ICD-10-CM | POA: Diagnosis not present

## 2016-09-05 DIAGNOSIS — E1142 Type 2 diabetes mellitus with diabetic polyneuropathy: Secondary | ICD-10-CM | POA: Diagnosis not present

## 2016-09-05 DIAGNOSIS — E113299 Type 2 diabetes mellitus with mild nonproliferative diabetic retinopathy without macular edema, unspecified eye: Secondary | ICD-10-CM | POA: Diagnosis not present

## 2016-09-05 LAB — MICROALBUMIN, URINE: MICROALB UR: 330

## 2016-09-05 LAB — LIPID PANEL
CHOLESTEROL: 204 — AB (ref 0–200)
HDL: 29 — AB (ref 35–70)
LDL CALC: 128
Triglycerides: 235 — AB (ref 40–160)

## 2016-09-13 DIAGNOSIS — M23241 Derangement of anterior horn of lateral meniscus due to old tear or injury, right knee: Secondary | ICD-10-CM | POA: Diagnosis not present

## 2016-09-13 DIAGNOSIS — M2241 Chondromalacia patellae, right knee: Secondary | ICD-10-CM | POA: Diagnosis not present

## 2016-09-13 DIAGNOSIS — M6751 Plica syndrome, right knee: Secondary | ICD-10-CM | POA: Diagnosis not present

## 2016-09-13 DIAGNOSIS — M23221 Derangement of posterior horn of medial meniscus due to old tear or injury, right knee: Secondary | ICD-10-CM | POA: Diagnosis not present

## 2016-09-13 DIAGNOSIS — M94261 Chondromalacia, right knee: Secondary | ICD-10-CM | POA: Diagnosis not present

## 2016-09-13 HISTORY — PX: KNEE ARTHROSCOPY: SUR90

## 2016-09-20 ENCOUNTER — Encounter: Payer: Self-pay | Admitting: Physical Therapy

## 2016-09-20 ENCOUNTER — Ambulatory Visit (INDEPENDENT_AMBULATORY_CARE_PROVIDER_SITE_OTHER): Payer: Medicare Other | Admitting: Physical Therapy

## 2016-09-20 DIAGNOSIS — M25661 Stiffness of right knee, not elsewhere classified: Secondary | ICD-10-CM | POA: Diagnosis present

## 2016-09-20 DIAGNOSIS — R2689 Other abnormalities of gait and mobility: Secondary | ICD-10-CM

## 2016-09-20 DIAGNOSIS — M6281 Muscle weakness (generalized): Secondary | ICD-10-CM

## 2016-09-20 DIAGNOSIS — M25561 Pain in right knee: Secondary | ICD-10-CM | POA: Diagnosis not present

## 2016-09-20 DIAGNOSIS — M25461 Effusion, right knee: Secondary | ICD-10-CM

## 2016-09-20 NOTE — Therapy (Signed)
[facility] [place], [place], [address] Phone: [phone]   Fax:  [phone]  Physical Therapy Evaluation  Patient Details  Name: Louis [name] MRN: [id number] Date of Birth: 1946/02/09 Referring Provider: Dr Dorna Leitz  Encounter Date: 09/20/2016      PT End of Session - 09/20/16 1558    Visit Number 1   Number of Visits 12   Date for PT Re-Evaluation 11/01/16   PT Start Time 1558   PT Stop Time 1701   PT Time Calculation (min) 63 min   Activity Tolerance Patient tolerated treatment well      Past Medical History:  Diagnosis Date  . CAD (coronary artery disease)   . Diabetes mellitus   . Heart failure, diastolic ([place])   . Hyperlipidemia   . Hypertension   . OSA on CPAP   . Personal history of colonic polyps - adenomas 01/28/2014  . Renal insufficiency   . Shortness of breath dyspnea     Past Surgical History:  Procedure Laterality Date  . CARPAL TUNNEL RELEASE    . PILONIDAL CYST EXCISION      There were no vitals filed for this visit.       Subjective Assessment - 09/20/16 1558    Subjective Pt had an elective Rt knee scope a week ago, after 3 yrs of knee getting worse.  He was having falls and decided it was time for surgery.  Was using a walker at home up until today, then weaned himself to a cane and walked into the clinic without any assistive device. He was been walking around the house and trying to bend his knee.    Pertinent History Rt foot surgery last fall from fx. bilat LE neuropathy   How long can you sit comfortably? 3 hrs   How long can you stand comfortably? 5'   How long can you walk comfortably? not limited on level surfaces with assistive device.    Diagnostic tests x-ray,    Patient Stated Goals walk normal, decrease pain, bend the knee better.    Currently in Pain? Yes   Pain Score 3    Pain Location Knee   Pain Orientation Right   Pain Descriptors /  Indicators Dull;Throbbing   Pain Type Surgical pain   Pain Onset 1 to 4 weeks ago   Pain Frequency Constant   Aggravating Factors  trying to squat,             [facility] PT Assessment - 09/20/16 0001      Assessment   Medical Diagnosis Rt knee scope    Referring Provider Dr Dorna Leitz   Onset Date/Surgical Date 09/13/16   Hand Dominance Right   Next MD Visit 09/21/16   Prior Therapy none     Precautions   Precautions None   Required Braces or Orthoses --  none     Balance Screen   Has the patient fallen in the past 6 months Yes   How many times? 4  Rt knee would give out on him   Has the patient had a decrease in activity level because of a fear of falling?  No  only sports   Is the patient reluctant to leave their home because of a fear of falling?  No     Home Environment   Living Environment Private residence   Living Arrangements Spouse/significant other   [place] One level  has basement, 17 steps  [place] - 2 wheels;Cane - single point     Prior Function   Level of Independence --  assist with socks/shoes   Leisure visit grandkids , play carda     Observation/Other Assessments   Focus on Therapeutic Outcomes (FOTO)  70% limited     Observation/Other Assessments-Edema    Edema Circumferential     Circumferential Edema   Circumferential - Right 44.8 cm, knee   Circumferential - Left  43.5 cm, knee     Functional Tests   Functional tests Squat;Single leg stance     Single Leg Stance   Comments Rt unable, Lt 3 sec     Posture/Postural Control   Posture/Postural Control --  bilat LE varus     ROM / Strength   AROM / PROM / Strength AROM;PROM;Strength     AROM   AROM Assessment Site Knee   Right/Left Knee Right  Lt 0-126   Right Knee Extension -13   Right Knee Flexion 92     PROM   PROM Assessment Site Knee   Right/Left Knee Right   Right Knee Extension -8   Right Knee Flexion 98     Strength   Strength Assessment Site  Hip;Knee;Ankle   Right/Left Hip Right  Lt WNL   Right Hip Flexion 4+/5   Right Hip Extension 4+/5   Right Hip ABduction --  5-/5   Right/Left Knee Right  Lt WNL   Right Knee Flexion 4+/5   Right Knee Extension 4/5  with pain   Right/Left Ankle --  bilat WNL     Flexibility   Soft Tissue Assessment /Muscle Length yes   Quadriceps prone knee flexion Lt 115, Rt 83     Palpation   Palpation comment tightness in Rt gastroc, tender and swollen anterior Rt knee, especially around the patellar tendon.      Ambulation/Gait   Ambulation Distance (Feet) --  observed in the gym   Assistive device None   Gait Pattern Decreased stance time - right;Decreased step length - right;Wide base of support;Antalgic  knee varus,                    [facility] Adult PT Treatment/Exercise - 09/20/16 0001      Exercises   Exercises Knee/Hip     Knee/Hip Exercises: Stretches   Quad Stretch Both;30 seconds  passive, prone with strap   Gastroc Stretch Right;30 seconds  at the wall     Knee/Hip Exercises: Seated   Long Arc Quad Strengthening;Right;10 reps   Heel Slides AROM;Right;10 reps     Knee/Hip Exercises: Supine   Quad Sets Strengthening;Right;5 sets  10 sec holds, VC for form   Heel Slides AROM;Right;5 sets  VC for form     Knee/Hip Exercises: Prone   Hamstring Curl 10 reps  Rt     Modalities   Modalities Electrical Stimulation;Vasopneumatic     Electrical Stimulation   Electrical Stimulation Location Rt knee   Electrical Stimulation Action ion repelling   Electrical Stimulation Parameters to tolerance   Electrical Stimulation Goals Pain;Edema     Vasopneumatic   Number Minutes Vasopneumatic  15 minutes   Vasopnuematic Location  Knee  Rt   Vasopneumatic Pressure Medium   Vasopneumatic Temperature  3*                PT Education - 09/20/16 1649    Education provided Yes   Education Details HEP   Person(s) Educated Patient  Methods  Explanation;Demonstration;Handout   Comprehension Returned demonstration;Verbalized understanding             PT Long Term Goals - 09/20/16 1656      PT LONG TERM GOAL #1   Title I with advanced HEP ( 11/01/16)    Time 6   Period Weeks   Status New     PT LONG TERM GOAL #2   Title reports overall reduction in Rt knee pain =/> 75% with walking ( 11/01/16)    Time 6   Period Weeks   Status New     PT LONG TERM GOAL #3   Title improve Rt knee motion -2 extension to 120 flexion to assist with climbing stairs at home( 11/01/16)    Time 6   Period Weeks   Status New     PT LONG TERM GOAL #4   Title improve Rt hip and knee strength =/> 5-/5 to allow safe ambulation on even/uneven surfaces ( 11/01/16)    Time 6   Period Weeks   Status New     PT LONG TERM GOAL #5   Title demo knee edema measurements within 0.50cm of each other ( 11/01/16)    Time 6   Period Weeks   Status New     Additional Long Term Goals   Additional Long Term Goals Yes     PT LONG TERM GOAL #6   Title walk without obvious gait abnormalities ( 11/01/16)    Time 6   Period Weeks   Status New               Plan - 09/20/16 1654    Clinical Impression Statement 70 yo male presents one week s/p Rt knee scope.  He is weaning himself off assistive devices with walking at home.  He presents with decreased knee ROM, decreased Rt LE strength, impaired proprioception, swelling and pain.    Rehab Potential Excellent   PT Frequency 2x / week   PT Duration 6 weeks   PT Treatment/Interventions Moist Heat;Ultrasound;Therapeutic exercise;Dry needling;Taping;Manual techniques;Vasopneumatic Device;Balance training;Neuromuscular re-education;Cryotherapy;Electrical Stimulation;Gait training;Stair training;Passive range of motion;Scar mobilization   PT Next Visit Plan bike for ROM, knee motion and LE strengthening   Consulted and Agree with Plan of Care Patient      Patient will benefit from skilled  therapeutic intervention in order to improve the following deficits and impairments:  Increased edema, Decreased strength, Pain, Impaired flexibility, Obesity, Difficulty walking, Decreased range of motion  Visit Diagnosis: Stiffness of right knee, not elsewhere classified - Plan: PT plan of care cert/re-cert  Acute pain of right knee - Plan: PT plan of care cert/re-cert  Muscle weakness (generalized) - Plan: PT plan of care cert/re-cert  Other abnormalities of gait and mobility - Plan: PT plan of care cert/re-cert  Swelling of joint, knee, right - Plan: PT plan of care cert/re-cert     Problem List Patient Active Problem List   Diagnosis Date Noted  . Right cervical radiculopathy 05/12/2016  . 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 Gout 04/20/2015  . Osteoarthritis of both acromioclavicular joints 12/03/2014  . Left shoulder pain 11/17/2014  . Acute decompensated heart failure ([place]) 10/05/2014  . Unstable angina pectoris ([place]) 10/05/2014  . Acute diastolic heart failure ([place]) 10/05/2014  . Personal history of colonic polyps - adenomas 01/28/2014  . Bilateral carpal tunnel  syndrome 01/09/2014  . Diabetic retinopathy ([place]) 10/31/2013  . Macular edema, diabetic ([place]) 10/31/2013  . Obesity (BMI 30-39.9) 07/31/2013  . Fatigue 03/06/2012  . CAD (coronary artery disease), native coronary artery 05/17/2011  . Nonspecific abnormal unspecified cardiovascular function study 03/01/2011  . Bradycardia 02/08/2011  . Right knee pain 02/08/2011  . Hyperlipidemia   . RBBB 02/01/2011  . Chronic kidney disease, stage 4, severely decreased GFR ([facility]) 10/23/2006  . Proteinuria 10/23/2006  . Obstructive sleep apnea 10/05/2006  . Diabetes mellitus with stage 4 chronic kidney disease ([place]) 08/28/2006  . HYPERTENSION, BENIGN SYSTEMIC 08/28/2006     Jeral Pinch PT 09/20/2016, 5:05 PM  [facility] [place], [place], [address] Phone: [phone]   Fax:  [phone]  Name: CLAUS SILVESTRO MRN: [id number] Date of Birth: 1946/02/22

## 2016-09-20 NOTE — Patient Instructions (Addendum)
Quad Set    With left leg bent, foot flat, slowly tighten muscles on right thigh of straight leg while counting out loud to __10 sec__. Repeat __10__ times. Do _3-4___ sessions per day.  Bracing With Heel Slides (Supine)    With neutral spine, tighten pelvic floor and abdominals and hold. Slide right heel to bottom. Repeat _10__ times. Do 3-4___ times a day.  Chair Knee Flexion    Keeping feet on floor, slide foot of operated leg back, bending knee. Hold __5-10__ seconds. Repeat _10___ times. Do __3-4__ sessions a day.  Prone Knee Flexion    Bend right knee, bringing heel toward buttocks. Hold __1-2__ seconds, then straighten. Can use the non-operated leg to push the operated leg. Repeat _10-20___ times. Do __3-4__ sessions per day.  Long [employer]    Straighten operated leg and try to hold it __5-10__ seconds. Use __0_ lbs on ankle. Repeat ___10_ times. Do _3-4___ sessions a day.   Quads / HF, Prone    Lie face down, knees together. Grasp one ankle with same-side hand. Use towel if needed to reach. Gently pull foot toward buttock. Hold _30__ seconds. Repeat _1__ times per session. Do _3-4__ sessions per day.  Gastroc, Standing    Stand, right foot behind, heel on floor and turned slightly out, leg straight, forward leg bent. Keeping arms straight, push pelvis forward until stretch is felt in calf. Hold _30__ seconds. Repeat _1__ times per session. Do _3-4__ sessions per day.  Copyright  VHI. All rights reserved.

## 2016-09-21 DIAGNOSIS — M25461 Effusion, right knee: Secondary | ICD-10-CM | POA: Diagnosis not present

## 2016-09-25 ENCOUNTER — Ambulatory Visit (INDEPENDENT_AMBULATORY_CARE_PROVIDER_SITE_OTHER): Payer: Medicare Other | Admitting: Physical Therapy

## 2016-09-25 DIAGNOSIS — R2689 Other abnormalities of gait and mobility: Secondary | ICD-10-CM

## 2016-09-25 DIAGNOSIS — M25661 Stiffness of right knee, not elsewhere classified: Secondary | ICD-10-CM

## 2016-09-25 DIAGNOSIS — M6281 Muscle weakness (generalized): Secondary | ICD-10-CM

## 2016-09-25 DIAGNOSIS — M25461 Effusion, right knee: Secondary | ICD-10-CM

## 2016-09-25 DIAGNOSIS — M25561 Pain in right knee: Secondary | ICD-10-CM | POA: Diagnosis not present

## 2016-09-25 NOTE — Therapy (Signed)
[place], [place], [address] Phone: [phone]   Fax:  [phone]  Physical Therapy Treatment  Patient Details  Name: Louis [name] MRN: [id number] Date of Birth: 06/08/1946 Referring Provider: Dr. Dorna Leitz  Encounter Date: 09/25/2016      PT End of Session - 09/25/16 0854    Visit Number 2   Number of Visits 12   Date for PT Re-Evaluation 11/01/16   PT Start Time 0849   PT Stop Time 0945   PT Time Calculation (min) 56 min   Activity Tolerance Patient limited by pain;Patient limited by fatigue      Past Medical History:  Diagnosis Date  . CAD (coronary artery disease)   . Diabetes mellitus   . Heart failure, diastolic ([place])   . Hyperlipidemia   . Hypertension   . OSA on CPAP   . Personal history of colonic polyps - adenomas 01/28/2014  . Renal insufficiency   . Shortness of breath dyspnea     Past Surgical History:  Procedure Laterality Date  . CARPAL TUNNEL RELEASE    . PILONIDAL CYST EXCISION      There were no vitals filed for this visit.      Subjective Assessment - 09/25/16 0855    Subjective Pt sat at football games all day Saturday, so Sunday he had increased Rt knee pain.  Pt ambulates into therapy with walking stick in Rt hand.     Patient Stated Goals walk normal, decrease pain, bend the knee better.  Get back into clogging/ dancing.   Currently in Pain? Yes   Pain Score 5    Pain Location Knee   Pain Orientation Right   Pain Descriptors / Indicators Dull;Throbbing   Aggravating Factors  prolonged sitting    Pain Relieving Factors medicine, resting             [facility] PT Assessment - 09/25/16 0001      Assessment   Medical Diagnosis Rt knee scope    Referring Provider Dr. Dorna Leitz   Onset Date/Surgical Date 09/13/16   Hand Dominance Right   Next MD Visit 10/05/16     AROM   Right Knee Extension -12   Right Knee Flexion 90          [facility]  Adult PT Treatment/Exercise - 09/25/16 0001      Knee/Hip Exercises: Stretches   Gastroc Stretch Right;30 seconds;3 reps  at the wall     Knee/Hip Exercises: Aerobic   Nustep L3: 5.5 min    Other Aerobic 2 laps around gym with cane in Lt hand.       Knee/Hip Exercises: Standing   Other Standing Knee Exercises Rt TKE with red band resistance x 5 sec hold x 8 reps (challenging)      Knee/Hip Exercises: Seated   Heel Slides AROM;Right;10 reps     Knee/Hip Exercises: Supine   Quad Sets Strengthening;Right;1 set;10 reps  5 sec holds   Heel Slides AROM;Right;1 set;5 reps  10 sec hold in flexion    Straight Leg Raises Strengthening;Right;1 set;10 reps     Knee/Hip Exercises: Prone   Hamstring Curl 5 reps;1 set  RLE   Prone Knee Hang --  30 sec x 5 reps     Modalities   Modalities Electrical Stimulation;Vasopneumatic     Electrical Stimulation   Electrical Stimulation Location Rt knee   Electrical Stimulation Action ion repelling    Electrical Stimulation Parameters  to tolerance    Electrical Stimulation Goals Pain;Edema     Vasopneumatic   Number Minutes Vasopneumatic  15 minutes   Vasopnuematic Location  Knee   Vasopneumatic Pressure Medium   Vasopneumatic Temperature  3*           PT Long Term Goals - 09/25/16 1421      PT LONG TERM GOAL #1   Title I with advanced HEP ( 11/01/16)    Time 6   Period Weeks   Status On-going     PT LONG TERM GOAL #2   Title reports overall reduction in Rt knee pain =/> 75% with walking ( 11/01/16)    Time 6   Period Weeks   Status On-going     PT LONG TERM GOAL #3   Title improve Rt knee motion -2 extension to 120 flexion to assist with climbing stairs at home( 11/01/16)    Time 6   Period Weeks   Status On-going     PT LONG TERM GOAL #4   Title improve Rt hip and knee strength =/> 5-/5 to allow safe ambulation on even/uneven surfaces ( 11/01/16)    Time 6   Period Weeks   Status On-going     PT LONG TERM GOAL #5    Title demo knee edema measurements within 0.50cm of each other ( 11/01/16)    Time 6   Period Weeks   Status On-going     PT LONG TERM GOAL #6   Title walk without obvious gait abnormalities ( 11/01/16)    Time 6   Period Weeks   Status On-going               Plan - 09/25/16 1015    Clinical Impression Statement Pt demonstrated Rt knee ROM similar to eval 5 days ago.  Pt had some increased pain in Rt knee with ther ex; reduced with use of vaso/estim at end of session.     Rehab Potential Excellent   PT Frequency 2x / week   PT Duration 6 weeks   PT Treatment/Interventions Moist Heat;Ultrasound;Therapeutic exercise;Dry needling;Taping;Manual techniques;Vasopneumatic Device;Balance training;Neuromuscular re-education;Cryotherapy;Electrical Stimulation;Gait training;Stair training;Passive range of motion;Scar mobilization   PT Next Visit Plan Continue progressive ROM/ strengthening for Rt knee (focus on extension)    PT Home Exercise Plan added prone hang to HEP.    Consulted and Agree with Plan of Care Patient      Patient will benefit from skilled therapeutic intervention in order to improve the following deficits and impairments:  Increased edema, Decreased strength, Pain, Impaired flexibility, Obesity, Difficulty walking, Decreased range of motion  Visit Diagnosis: Stiffness of right knee, not elsewhere classified  Acute pain of right knee  Muscle weakness (generalized)  Other abnormalities of gait and mobility  Swelling of joint, knee, right     Problem List Patient Active Problem List   Diagnosis Date Noted  . Right cervical radiculopathy 05/12/2016  . 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 Gout 04/20/2015  . Osteoarthritis of both acromioclavicular joints 12/03/2014  . Left shoulder pain 11/17/2014  . Acute  decompensated heart failure ([place]) 10/05/2014  . Unstable angina pectoris ([place]) 10/05/2014  . Acute diastolic heart failure ([place]) 10/05/2014  . Personal history of colonic polyps - adenomas 01/28/2014  . Bilateral carpal tunnel syndrome 01/09/2014  . Diabetic retinopathy ([place]) 10/31/2013  . Macular  edema, diabetic ([place]) 10/31/2013  . Obesity (BMI 30-39.9) 07/31/2013  . Fatigue 03/06/2012  . CAD (coronary artery disease), native coronary artery 05/17/2011  . Nonspecific abnormal unspecified cardiovascular function study 03/01/2011  . Bradycardia 02/08/2011  . Right knee pain 02/08/2011  . Hyperlipidemia   . RBBB 02/01/2011  . Chronic kidney disease, stage 4, severely decreased GFR ([facility]) 10/23/2006  . Proteinuria 10/23/2006  . Obstructive sleep apnea 10/05/2006  . Diabetes mellitus with stage 4 chronic kidney disease ([place]) 08/28/2006  . HYPERTENSION, BENIGN SYSTEMIC 08/28/2006   Kerin Perna, PTA 09/25/16 2:23 PM  [place], [place], [address] Phone: [phone]   Fax:  [phone]  Name: Louis [name] MRN: [id number] Date of Birth: Aug 10, 1946

## 2016-09-28 ENCOUNTER — Ambulatory Visit (INDEPENDENT_AMBULATORY_CARE_PROVIDER_SITE_OTHER): Payer: Medicare Other | Admitting: Physical Therapy

## 2016-09-28 ENCOUNTER — Encounter: Payer: Self-pay | Admitting: Physical Therapy

## 2016-09-28 DIAGNOSIS — M6281 Muscle weakness (generalized): Secondary | ICD-10-CM | POA: Diagnosis not present

## 2016-09-28 DIAGNOSIS — M25561 Pain in right knee: Secondary | ICD-10-CM

## 2016-09-28 DIAGNOSIS — E113212 Type 2 diabetes mellitus with mild nonproliferative diabetic retinopathy with macular edema, left eye: Secondary | ICD-10-CM | POA: Diagnosis not present

## 2016-09-28 DIAGNOSIS — M25661 Stiffness of right knee, not elsewhere classified: Secondary | ICD-10-CM | POA: Diagnosis present

## 2016-09-28 NOTE — Therapy (Signed)
[facility] [place] [place], [place], [address] Phone: [phone]   Fax:  [phone]  Physical Therapy Treatment  Patient Details  Name: Louis [name] MRN: [id number] Date of Birth: April 27, 1946 Referring Provider: Dr. Dorna Leitz   Encounter Date: 09/28/2016      PT End of Session - 09/28/16 1541    Visit Number 3   Number of Visits 12   Date for PT Re-Evaluation 11/01/16   PT Start Time 7741   PT Stop Time 2878   PT Time Calculation (min) 56 min   Activity Tolerance Patient tolerated treatment well      Past Medical History:  Diagnosis Date  . CAD (coronary artery disease)   . Diabetes mellitus   . Heart failure, diastolic ([place])   . Hyperlipidemia   . Hypertension   . OSA on CPAP   . Personal history of colonic polyps - adenomas 01/28/2014  . Renal insufficiency   . Shortness of breath dyspnea     Past Surgical History:  Procedure Laterality Date  . CARPAL TUNNEL RELEASE    . PILONIDAL CYST EXCISION      There were no vitals filed for this visit.      Subjective Assessment - 09/28/16 1542    Subjective Pt reports he is recovering from stomach virus. He felt sore after last session. He has been practicing ambulating with cane in Lt hand, but prefers to have it in [place].   Currently in Pain? Yes   Pain Score 5    Pain Location Knee   Pain Orientation Right   Pain Descriptors / Indicators Simonne Martinet PT Assessment - 09/28/16 0001      Assessment   Medical Diagnosis Rt knee scope    Referring Provider Dr. Dorna Leitz    Onset Date/Surgical Date 09/13/16   Hand Dominance Right   Next MD Visit 10/05/16     AROM   Right Knee Flexion 100         [facility] Adult PT Treatment/Exercise - 09/28/16 0001      Knee/Hip Exercises: Stretches   Gastroc Stretch Right;30 seconds;3 reps  at the wall     Knee/Hip Exercises: Aerobic   Nustep L4: 6 min (arms and legs)     Knee/Hip  Exercises: Standing   Forward Step Up Right;1 set;10 reps;Hand Hold: 2  3" step, backwards step down with Lt foot.      Knee/Hip Exercises: Seated   Long Arc Quad Strengthening;Right;1 set;10 reps  5 sec in ext   Other Seated Knee/Hip Exercises seated scoots at edge of bed x 10 reps      Knee/Hip Exercises: Prone   Hamstring Curl 5 reps   Prone Knee Hang 1 minute  2 reps     Modalities   Modalities Electrical Stimulation;Vasopneumatic     Electrical Stimulation   Electrical Stimulation Location Rt knee    Electrical Stimulation Action ion repelling    Electrical Stimulation Parameters to tolerance   Electrical Stimulation Goals Edema;Pain     Vasopneumatic   Number Minutes Vasopneumatic  15 minutes   Vasopnuematic Location  Knee   Vasopneumatic Pressure Medium   Vasopneumatic Temperature  3*           PT Long Term Goals - 09/25/16 1421      PT LONG TERM GOAL #1   Title I with advanced HEP ( 11/01/16)  Time 6   Period Weeks   Status On-going     PT LONG TERM GOAL #2   Title reports overall reduction in Rt knee pain =/> 75% with walking ( 11/01/16)    Time 6   Period Weeks   Status On-going     PT LONG TERM GOAL #3   Title improve Rt knee motion -2 extension to 120 flexion to assist with climbing stairs at home( 11/01/16)    Time 6   Period Weeks   Status On-going     PT LONG TERM GOAL #4   Title improve Rt hip and knee strength =/> 5-/5 to allow safe ambulation on even/uneven surfaces ( 11/01/16)    Time 6   Period Weeks   Status On-going     PT LONG TERM GOAL #5   Title demo knee edema measurements within 0.50cm of each other ( 11/01/16)    Time 6   Period Weeks   Status On-going     PT LONG TERM GOAL #6   Title walk without obvious gait abnormalities ( 11/01/16)    Time 6   Period Weeks   Status On-going               Plan - 09/28/16 1726    Clinical Impression Statement Pt demonstrated improved Rt knee flexion this visit.  He did  have limited exercise tolerance today, most likely due to him recovering from virus earlier in week. Pt reported decreased pain level after use of vaso/ estim.     Rehab Potential Excellent   PT Frequency 2x / week   PT Duration 6 weeks   PT Treatment/Interventions Moist Heat;Ultrasound;Therapeutic exercise;Dry needling;Taping;Manual techniques;Vasopneumatic Device;Balance training;Neuromuscular re-education;Cryotherapy;Electrical Stimulation;Gait training;Stair training;Passive range of motion;Scar mobilization   PT Next Visit Plan Continue progressive ROM/ strengthening for Rt knee (focus on extension)    Consulted and Agree with Plan of Care Patient      Patient will benefit from skilled therapeutic intervention in order to improve the following deficits and impairments:  Increased edema, Decreased strength, Pain, Impaired flexibility, Obesity, Difficulty walking, Decreased range of motion  Visit Diagnosis: Stiffness of right knee, not elsewhere classified  Acute pain of right knee  Muscle weakness (generalized)     Problem List Patient Active Problem List   Diagnosis Date Noted  . Right cervical radiculopathy 05/12/2016  . 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 Gout 04/20/2015  . Osteoarthritis of both acromioclavicular joints 12/03/2014  . Left shoulder pain 11/17/2014  . Acute decompensated heart failure ([place]) 10/05/2014  . Unstable angina pectoris ([place]) 10/05/2014  . Acute diastolic heart failure ([place]) 10/05/2014  . Personal history of colonic polyps - adenomas 01/28/2014  . Bilateral carpal tunnel syndrome 01/09/2014  . Diabetic retinopathy ([place]) 10/31/2013  . Macular edema, diabetic ([place]) 10/31/2013  . Obesity (BMI 30-39.9) 07/31/2013  . Fatigue 03/06/2012  . CAD (coronary artery disease), native coronary artery  05/17/2011  . Nonspecific abnormal unspecified cardiovascular function study 03/01/2011  . Bradycardia 02/08/2011  . Right knee pain 02/08/2011  . Hyperlipidemia   . RBBB 02/01/2011  . Chronic kidney disease, stage 4, severely decreased GFR (HCC) 10/23/2006  . Proteinuria 10/23/2006  . Obstructive sleep apnea 10/05/2006  . Diabetes mellitus with stage 4 chronic kidney disease ([place]) 08/28/2006  . HYPERTENSION, BENIGN SYSTEMIC 08/28/2006   Kerin Perna, PTA 09/28/16 5:37  [facility] [place], [place], [address] Phone: [phone]   Fax:  [phone]  Name: Louis [name] MRN: [id number] Date of Birth: [birth year]

## 2016-10-02 ENCOUNTER — Encounter: Payer: Self-pay | Admitting: Physical Therapy

## 2016-10-02 ENCOUNTER — Ambulatory Visit (INDEPENDENT_AMBULATORY_CARE_PROVIDER_SITE_OTHER): Payer: Medicare Other | Admitting: Physical Therapy

## 2016-10-02 DIAGNOSIS — M25461 Effusion, right knee: Secondary | ICD-10-CM

## 2016-10-02 DIAGNOSIS — R2689 Other abnormalities of gait and mobility: Secondary | ICD-10-CM | POA: Diagnosis not present

## 2016-10-02 DIAGNOSIS — M6281 Muscle weakness (generalized): Secondary | ICD-10-CM | POA: Diagnosis not present

## 2016-10-02 DIAGNOSIS — M25561 Pain in right knee: Secondary | ICD-10-CM | POA: Diagnosis not present

## 2016-10-02 DIAGNOSIS — M25661 Stiffness of right knee, not elsewhere classified: Secondary | ICD-10-CM | POA: Diagnosis present

## 2016-10-02 NOTE — Therapy (Signed)
[facility] [place], [place], [address] Phone: [phone]   Fax:  [phone]  Physical Therapy Treatment  Patient Details  Name: Louis [name] MRN: [id number] Date of Birth: 31-May-1946 Referring Provider: Dr Dorna Leitz  Encounter Date: 10/02/2016      PT End of Session - 10/02/16 1101    Visit Number 4   Number of Visits 12   Date for PT Re-Evaluation 11/01/16   PT Start Time 1018   PT Stop Time 1115   PT Time Calculation (min) 57 min   Activity Tolerance Patient tolerated treatment well      Past Medical History:  Diagnosis Date  . CAD (coronary artery disease)   . Diabetes mellitus   . Heart failure, diastolic ([place])   . Hyperlipidemia   . Hypertension   . OSA on CPAP   . Personal history of colonic polyps - adenomas 01/28/2014  . Renal insufficiency   . Shortness of breath dyspnea     Past Surgical History:  Procedure Laterality Date  . CARPAL TUNNEL RELEASE    . PILONIDAL CYST EXCISION      There were no vitals filed for this visit.      Subjective Assessment - 10/02/16 1021    Subjective Pt reports he has been looking forward to this all weekend.    Patient Stated Goals walk normal, decrease pain, bend the knee better.  Get back into clogging/ dancing.   Currently in Pain? No/denies            [facility] PT Assessment - 10/02/16 0001      Assessment   Medical Diagnosis Rt knee scope    Referring Provider Dr Dorna Leitz   Onset Date/Surgical Date 09/13/16   Hand Dominance Right   Next MD Visit 10/05/16     ROM / Strength   AROM / PROM / Strength AROM;Strength     AROM   Right/Left Knee Right   Right Knee Extension -7   Right Knee Flexion 103                     [facility] Adult PT Treatment/Exercise - 10/02/16 0001      Ambulation/Gait   Ambulation/Gait Yes   Ambulation/Gait Assistance 7: Independent   Assistive device None   Gait Pattern --  VC to keep  toes facing forward.      Knee/Hip Exercises: Stretches   Passive Hamstring Stretch Right;3 reps;30 seconds  with strap   Gastroc Stretch Both;1 rep;30 seconds     Knee/Hip Exercises: Aerobic   Nustep L5x5', arms and legs     Knee/Hip Exercises: Standing   Heel Raises Both;10 reps;2 sets     Knee/Hip Exercises: Seated   Long Arc Quad Strengthening;Right;20 reps;Weights   Long Arc Quad Weight 4 lbs.   Heel Slides Strengthening;20 reps  red band     Knee/Hip Exercises: Supine   Heel Slides AROM;Right  3reps, self mobs     Modalities   Modalities Passenger transport manager Location Rt knee    Building services engineer Parameters to tolerance   Electrical Stimulation Goals Edema;Pain     Vasopneumatic   Number Minutes Vasopneumatic  15 minutes   Vasopnuematic Location  Knee   Vasopneumatic Pressure Medium   Vasopneumatic Temperature  3*     Manual Therapy   Manual Therapy Joint mobilization  Joint Mobilization Rt knee extension, grade III mobs with foot propped on bolster.                      PT Long Term Goals - 10/02/16 1023      PT LONG TERM GOAL #1   Title I with advanced HEP ( 11/01/16)    Status On-going     PT LONG TERM GOAL #2   Title reports overall reduction in Rt knee pain =/> 75% with walking ( 11/01/16)    Status On-going  improving     PT LONG TERM GOAL #3   Title improve Rt knee motion -2 extension to 120 flexion to assist with climbing stairs at home( 11/01/16)    Status On-going     PT LONG TERM GOAL #4   Title improve Rt hip and knee strength =/> 5-/5 to allow safe ambulation on even/uneven surfaces ( 11/01/16)    Status On-going     PT LONG TERM GOAL #5   Title demo knee edema measurements within 0.50cm of each other ( 11/01/16)    Status On-going     PT LONG TERM GOAL #6   Title walk without obvious gait  abnormalities ( 11/01/16)    Status On-going               Plan - 10/02/16 1302    Clinical Impression Statement Louis [name]'s mobility is improving.  He still fatigues quickly and has limited knee ROM and LE strength. Edema in the knee is also decreasing.  No goals met, progressing to them.    Rehab Potential Excellent   PT Frequency 2x / week   PT Duration 6 weeks   PT Treatment/Interventions Moist Heat;Ultrasound;Therapeutic exercise;Dry needling;Taping;Manual techniques;Vasopneumatic Device;Balance training;Neuromuscular re-education;Cryotherapy;Electrical Stimulation;Gait training;Stair training;Passive range of motion;Scar mobilization   PT Next Visit Plan begin stair training , step ups.    Consulted and Agree with Plan of Care Patient      Patient will benefit from skilled therapeutic intervention in order to improve the following deficits and impairments:  Increased edema, Decreased strength, Pain, Impaired flexibility, Obesity, Difficulty walking, Decreased range of motion  Visit Diagnosis: Stiffness of right knee, not elsewhere classified  Acute pain of right knee  Muscle weakness (generalized)  Other abnormalities of gait and mobility  Swelling of joint, knee, right     Problem List Patient Active Problem List   Diagnosis Date Noted  . Right cervical radiculopathy 05/12/2016  . 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 Gout 04/20/2015  . Osteoarthritis of both acromioclavicular joints 12/03/2014  . Left shoulder pain 11/17/2014  . Acute decompensated heart failure ([place]) 10/05/2014  . Unstable angina pectoris ([place]) 10/05/2014  . Acute diastolic heart failure ([place]) 10/05/2014  . Personal history of colonic polyps - adenomas 01/28/2014  . Bilateral carpal tunnel syndrome 01/09/2014  . Diabetic retinopathy ([place])  10/31/2013  . Macular edema, diabetic ([place]) 10/31/2013  . Obesity (BMI 30-39.9) 07/31/2013  . Fatigue 03/06/2012  . CAD (coronary artery disease), native coronary artery 05/17/2011  . Nonspecific abnormal unspecified cardiovascular function study 03/01/2011  . Bradycardia 02/08/2011  . Right knee pain 02/08/2011  . Hyperlipidemia   . RBBB 02/01/2011  . Chronic kidney disease, stage 4, severely decreased GFR (HCC) 10/23/2006  . Proteinuria 10/23/2006  . Obstructive sleep apnea 10/05/2006  . Diabetes mellitus with stage  4 chronic kidney disease ([place]) 08/28/2006  . HYPERTENSION, BENIGN SYSTEMIC 08/28/2006    Boneta Lucks rPT  10/02/2016, 1:05 PM  [facility] [place], [place], [address] Phone: [phone]   Fax:  [phone]  Name: MCCRAE SPECIALE MRN: [id number] Date of Birth: [birth year]

## 2016-10-03 DIAGNOSIS — E0822 Diabetes mellitus due to underlying condition with diabetic chronic kidney disease: Secondary | ICD-10-CM | POA: Diagnosis not present

## 2016-10-03 DIAGNOSIS — I129 Hypertensive chronic kidney disease with stage 1 through stage 4 chronic kidney disease, or unspecified chronic kidney disease: Secondary | ICD-10-CM | POA: Diagnosis not present

## 2016-10-03 DIAGNOSIS — M109 Gout, unspecified: Secondary | ICD-10-CM | POA: Diagnosis not present

## 2016-10-03 DIAGNOSIS — N184 Chronic kidney disease, stage 4 (severe): Secondary | ICD-10-CM | POA: Diagnosis not present

## 2016-10-03 DIAGNOSIS — D631 Anemia in chronic kidney disease: Secondary | ICD-10-CM | POA: Diagnosis not present

## 2016-10-03 DIAGNOSIS — E669 Obesity, unspecified: Secondary | ICD-10-CM | POA: Diagnosis not present

## 2016-10-03 DIAGNOSIS — R809 Proteinuria, unspecified: Secondary | ICD-10-CM | POA: Diagnosis not present

## 2016-10-05 ENCOUNTER — Ambulatory Visit (INDEPENDENT_AMBULATORY_CARE_PROVIDER_SITE_OTHER): Payer: Medicare Other | Admitting: Physical Therapy

## 2016-10-05 DIAGNOSIS — Z9889 Other specified postprocedural states: Secondary | ICD-10-CM | POA: Diagnosis not present

## 2016-10-05 DIAGNOSIS — M6281 Muscle weakness (generalized): Secondary | ICD-10-CM

## 2016-10-05 DIAGNOSIS — M25661 Stiffness of right knee, not elsewhere classified: Secondary | ICD-10-CM | POA: Diagnosis present

## 2016-10-05 DIAGNOSIS — M25561 Pain in right knee: Secondary | ICD-10-CM

## 2016-10-05 NOTE — Therapy (Signed)
Dictation #1 [id number]  [id number] [place] [address] [place], [place], [address] Phone: [phone]   Fax:  [phone]  Physical Therapy Treatment  Patient Details  Name: Louis [name] MRN: [id number] Date of Birth: Mar 21, 1946 Referring Provider: Dr. Dorna Leitz   Encounter Date: 10/05/2016      PT End of Session - 10/05/16 1416    Visit Number 5   Number of Visits 12   Date for PT Re-Evaluation 11/01/16   PT Start Time 2297   PT Stop Time 1509   PT Time Calculation (min) 58 min   Activity Tolerance Patient tolerated treatment well      Past Medical History:  Diagnosis Date  . CAD (coronary artery disease)   . Diabetes mellitus   . Heart failure, diastolic ([place])   . Hyperlipidemia   . Hypertension   . OSA on CPAP   . Personal history of colonic polyps - adenomas 01/28/2014  . Renal insufficiency   . Shortness of breath dyspnea     Past Surgical History:  Procedure Laterality Date  . CARPAL TUNNEL RELEASE    . PILONIDAL CYST EXCISION      There were no vitals filed for this visit.      Subjective Assessment - 10/05/16 1416    Subjective Pt reports he went to MD and he got a good report.  He's feeling pretty good today.  "I think I'm stepping better, not using cane last 2 days"    Patient Stated Goals walk normal, decrease pain, bend the knee better.  Get back into clogging/ dancing.   Currently in Pain? Yes   Pain Score 3    Pain Location Knee   Pain Orientation Right   Pain Descriptors / Indicators Dull;Aching   Aggravating Factors  prolonged sitting    Pain Relieving Factors sleep with pillows supporting leg.             [facility] PT Assessment - 10/05/16 0001      Assessment   Medical Diagnosis Rt knee scope    Referring Provider Dr. Dorna Leitz    Onset Date/Surgical Date 09/13/16   Hand Dominance Right   Next MD Visit 10/26/16     AROM   Right/Left Knee Right   Right  Knee Flexion 107          [facility] Adult PT Treatment/Exercise - 10/05/16 0001      Knee/Hip Exercises: Stretches   Passive Hamstring Stretch 2 reps;30 seconds;Right;Left   Gastroc Stretch Right;5 reps;20 seconds  Lt 3 reps      Knee/Hip Exercises: Aerobic   Nustep L4 x 7', arms and legs     Knee/Hip Exercises: Standing   Heel Raises Both;10 reps;2 sets   Lateral Step Up Right;1 set;10 reps;Hand Hold: 1;Step Height: 2"   Forward Step Up Right;1 set;10 reps;Hand Hold: 2;Step Height: 2"     Knee/Hip Exercises: Seated   Long Arc Quad Strengthening;Right;Weights;10 reps;2 sets   [employer] Weight 4 lbs.   Heel Slides Strengthening;20 reps  red band   Other Seated Knee/Hip Exercises seated scoots at edge of bed x 5 reps      Modalities   Modalities Electrical Stimulation     Electrical Stimulation   Electrical Stimulation Location Rt knee    Electrical Stimulation Action ion repelling    Electrical Stimulation Parameters to tolerance    Electrical Stimulation Goals Edema;Pain     Vasopneumatic   Number Minutes  Vasopneumatic  15 minutes   Vasopnuematic Location  Knee   Vasopneumatic Pressure Medium   Vasopneumatic Temperature  3*      Manual Therapy   Manual Therapy Joint mobilization   Joint Mobilization Rt knee extension, grade III mobs with foot propped on bolster.                      PT Long Term Goals - 10/02/16 1023      PT LONG TERM GOAL #1   Title I with advanced HEP ( 11/01/16)    Status On-going     PT LONG TERM GOAL #2   Title reports overall reduction in Rt knee pain =/> 75% with walking ( 11/01/16)    Status On-going  improving     PT LONG TERM GOAL #3   Title improve Rt knee motion -2 extension to 120 flexion to assist with climbing stairs at home( 11/01/16)    Status On-going     PT LONG TERM GOAL #4   Title improve Rt hip and knee strength =/> 5-/5 to allow safe ambulation on even/uneven surfaces ( 11/01/16)    Status On-going      PT LONG TERM GOAL #5   Title demo knee edema measurements within 0.50cm of each other ( 11/01/16)    Status On-going     PT LONG TERM GOAL #6   Title walk without obvious gait abnormalities ( 11/01/16)    Status On-going               Plan - 10/05/16 1458    Rehab Potential Excellent   PT Frequency 2x / week   PT Duration 6 weeks   PT Treatment/Interventions Moist Heat;Ultrasound;Therapeutic exercise;Dry needling;Taping;Manual techniques;Vasopneumatic Device;Balance training;Neuromuscular re-education;Cryotherapy;Electrical Stimulation;Gait training;Stair training;Passive range of motion;Scar mobilization   PT Next Visit Plan Continue progressive Rt knee ROM/ strengthening.       Patient will benefit from skilled therapeutic intervention in order to improve the following deficits and impairments:  Increased edema, Decreased strength, Pain, Impaired flexibility, Obesity, Difficulty walking, Decreased range of motion  Visit Diagnosis: Stiffness of right knee, not elsewhere classified  Acute pain of right knee  Muscle weakness (generalized)     Problem List Patient Active Problem List   Diagnosis Date Noted  . Right cervical radiculopathy 05/12/2016  . 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 Gout 04/20/2015  . Osteoarthritis of both acromioclavicular joints 12/03/2014  . Left shoulder pain 11/17/2014  . Acute decompensated heart failure ([place]) 10/05/2014  . Unstable angina pectoris ([place]) 10/05/2014  . Acute diastolic heart failure ([place]) 10/05/2014  . Personal history of colonic polyps - adenomas 01/28/2014  . Bilateral carpal tunnel syndrome 01/09/2014  . Diabetic retinopathy ([place]) 10/31/2013  . Macular edema, diabetic ([place]) 10/31/2013  . Obesity (BMI 30-39.9) 07/31/2013  . Fatigue 03/06/2012  . CAD (coronary  artery disease), native coronary artery 05/17/2011  . Nonspecific abnormal unspecified cardiovascular function study 03/01/2011  . Bradycardia 02/08/2011  . Right knee pain 02/08/2011  . Hyperlipidemia   . RBBB 02/01/2011  . Chronic kidney disease, stage 4, severely decreased GFR (HCC) 10/23/2006  . Proteinuria 10/23/2006  . Obstructive sleep apnea 10/05/2006  . Diabetes mellitus with stage 4 chronic kidney disease ([place]) 08/28/2006  . HYPERTENSION, BENIGN SYSTEMIC 08/28/2006   Kerin Perna, PTA 10/05/16 2:59 PM  [facility] [place], [place], [address] Phone: [phone]   Fax:  [phone]  Name: Louis [name] MRN: [id number] Date of Birth: 25-Jan-1946

## 2016-10-09 ENCOUNTER — Ambulatory Visit (INDEPENDENT_AMBULATORY_CARE_PROVIDER_SITE_OTHER): Payer: Medicare Other | Admitting: Physical Therapy

## 2016-10-09 DIAGNOSIS — M25461 Effusion, right knee: Secondary | ICD-10-CM

## 2016-10-09 DIAGNOSIS — R2689 Other abnormalities of gait and mobility: Secondary | ICD-10-CM | POA: Diagnosis not present

## 2016-10-09 DIAGNOSIS — M25661 Stiffness of right knee, not elsewhere classified: Secondary | ICD-10-CM | POA: Diagnosis present

## 2016-10-09 DIAGNOSIS — M6281 Muscle weakness (generalized): Secondary | ICD-10-CM | POA: Diagnosis not present

## 2016-10-09 DIAGNOSIS — M25561 Pain in right knee: Secondary | ICD-10-CM

## 2016-10-09 NOTE — Therapy (Signed)
[place], [place], [address] Phone: [phone]   Fax:  [phone]  Physical Therapy Treatment  Patient Details  Name: Louis [name] MRN: [id number] Date of Birth: 03-22-1946 Referring Provider: Dr. Dorna Leitz   Encounter Date: 10/09/2016      PT End of Session - 10/09/16 1027    Visit Number 6   Number of Visits 12   Date for PT Re-Evaluation 11/01/16   PT Start Time 1019   PT Stop Time 1118   PT Time Calculation (min) 59 min   Activity Tolerance Patient tolerated treatment well   Behavior During Therapy [facility] for tasks assessed/performed      Past Medical History:  Diagnosis Date  . CAD (coronary artery disease)   . Diabetes mellitus   . Heart failure, diastolic ([place])   . Hyperlipidemia   . Hypertension   . OSA on CPAP   . Personal history of colonic polyps - adenomas 01/28/2014  . Renal insufficiency   . Shortness of breath dyspnea     Past Surgical History:  Procedure Laterality Date  . CARPAL TUNNEL RELEASE    . PILONIDAL CYST EXCISION      There were no vitals filed for this visit.      Subjective Assessment - 10/09/16 1027    Subjective Pt reports his Rt calf and shin has been painful for a couple weeks now. He contributes it to walking too much.    Currently in Pain? Yes   Pain Score 2    Pain Location Leg   Pain Orientation Right   Pain Descriptors / Indicators Aching   Aggravating Factors  prolonged sitting and walking    Pain Relieving Factors sleep with pillows supporting leg.             [facility] PT Assessment - 10/09/16 0001      Assessment   Medical Diagnosis Rt knee scope    Referring Provider Dr. Dorna Leitz    Onset Date/Surgical Date 09/13/16   Hand Dominance Right   Next MD Visit 10/26/16     Circumferential Edema   Circumferential - Right 44cm   Circumferential - Left  43cm     AROM   Right/Left Knee Right   Right Knee Extension -6   Right Knee Flexion 115         [facility] Adult PT Treatment/Exercise - 10/09/16 0001      Knee/Hip Exercises: Stretches   Passive Hamstring Stretch 2 reps;30 seconds;Right;Left   Gastroc Stretch Right;5 reps;20 seconds  Lt 3 reps      Knee/Hip Exercises: Aerobic   Tread Mill 2.5 min @ 1.64mph   Nustep L4 x 6', arms and legs     Knee/Hip Exercises: Standing   Heel Raises Both;10 reps;2 sets   Forward Step Up Right;1 set;15 reps;Hand Hold: 2;Step Height: 6"   Other Standing Knee Exercises semi-tandem stance x 15 sec x 2 reps each foot forward. Two step with UE support x 40 ft, with VC.       Knee/Hip Exercises: Seated   Other Seated Knee/Hip Exercises seated scoots at edge of bed x 10 reps;   Rt knee flex/ext with foot on fitter (2 blue cords ) x 20 reps     Vasopneumatic   Number Minutes Vasopneumatic  15 minutes   Vasopnuematic Location  Knee   Vasopneumatic Pressure Low, 32 deg     Manual Therapy   Manual Therapy Joint mobilization  Joint Mobilization Rt knee extension, grade III mobs with foot propped on bolster.            PT Long Term Goals - 10/09/16 1038      PT LONG TERM GOAL #1   Title I with advanced HEP ( 11/01/16)    Time 6   Period Weeks   Status On-going     PT LONG TERM GOAL #2   Title reports overall reduction in Rt knee pain =/> 75% with walking ( 11/01/16)    Time 6   Period Weeks   Status On-going  25% improvement     PT LONG TERM GOAL #3   Title improve Rt knee motion -2 extension to 120 flexion to assist with climbing stairs at home( 11/01/16)    Time 6   Period Weeks   Status On-going     PT LONG TERM GOAL #4   Title improve Rt hip and knee strength =/> 5-/5 to allow safe ambulation on even/uneven surfaces ( 11/01/16)    Time 6   Period Weeks   Status On-going     PT LONG TERM GOAL #5   Title demo knee edema measurements within 0.50cm of each other ( 11/01/16)    Time 6   Period Weeks   Status On-going  improving     PT LONG  TERM GOAL #6   Title walk without obvious gait abnormalities ( 11/01/16)    Time 6   Period Weeks   Status On-going  improving               Plan - 10/09/16 1158    Clinical Impression Statement Pt demonstrated improved Rt knee flexion, nearing LTG #3.  Edema slight reduced.  Exercise tolerance improving each visit.  Pt is progressing towards established goals.    Rehab Potential Excellent   PT Frequency 2x / week   PT Duration 6 weeks   PT Treatment/Interventions Moist Heat;Ultrasound;Therapeutic exercise;Dry needling;Taping;Manual techniques;Vasopneumatic Device;Balance training;Neuromuscular re-education;Cryotherapy;Electrical Stimulation;Gait training;Stair training;Passive range of motion;Scar mobilization   PT Next Visit Plan Continue progressive Rt knee ROM/ strengthening.    Consulted and Agree with Plan of Care Patient      Patient will benefit from skilled therapeutic intervention in order to improve the following deficits and impairments:  Increased edema, Decreased strength, Pain, Impaired flexibility, Obesity, Difficulty walking, Decreased range of motion  Visit Diagnosis: Stiffness of right knee, not elsewhere classified  Acute pain of right knee  Muscle weakness (generalized)  Other abnormalities of gait and mobility  Swelling of joint, knee, right     Problem List Patient Active Problem List   Diagnosis Date Noted  . Right cervical radiculopathy 05/12/2016  . 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 Gout 04/20/2015  . Osteoarthritis of both acromioclavicular joints 12/03/2014  . Left shoulder pain 11/17/2014  . Acute decompensated heart failure ([place]) 10/05/2014  . Unstable angina pectoris ([place]) 10/05/2014  . Acute diastolic heart failure ([place]) 10/05/2014  . Personal history of colonic polyps - adenomas  01/28/2014  . Bilateral carpal tunnel syndrome 01/09/2014  . Diabetic retinopathy ([place]) 10/31/2013  . Macular edema, diabetic ([place]) 10/31/2013  . Obesity (BMI 30-39.9) 07/31/2013  . Fatigue 03/06/2012  . CAD (coronary artery disease), native coronary artery 05/17/2011  . Nonspecific abnormal unspecified cardiovascular function study 03/01/2011  . Bradycardia 02/08/2011  . Right knee  pain 02/08/2011  . Hyperlipidemia   . RBBB 02/01/2011  . Chronic kidney disease, stage 4, severely decreased GFR (HCC) 10/23/2006  . Proteinuria 10/23/2006  . Obstructive sleep apnea 10/05/2006  . Diabetes mellitus with stage 4 chronic kidney disease ([place]) 08/28/2006  . HYPERTENSION, BENIGN SYSTEMIC 08/28/2006   Kerin Perna, PTA 10/09/16 12:03 PM  [place], [place], [address] Phone: [phone]   Fax:  [phone]  Name: Louis [name] MRN: [id number] Date of Birth: [birth year]

## 2016-10-10 ENCOUNTER — Ambulatory Visit (INDEPENDENT_AMBULATORY_CARE_PROVIDER_SITE_OTHER): Payer: Medicare Other | Admitting: Family Medicine

## 2016-10-10 ENCOUNTER — Encounter: Payer: Self-pay | Admitting: Family Medicine

## 2016-10-10 VITALS — BP 118/59 | HR 48 | Temp 97.5°F

## 2016-10-10 DIAGNOSIS — B370 Candidal stomatitis: Secondary | ICD-10-CM | POA: Diagnosis not present

## 2016-10-10 DIAGNOSIS — I2510 Atherosclerotic heart disease of native coronary artery without angina pectoris: Secondary | ICD-10-CM

## 2016-10-10 MED ORDER — NYSTATIN 100000 UNIT/ML MT SUSP: 5.0000 mL | Freq: Four times a day (QID) | OROMUCOSAL | 0 refills | Status: DC

## 2016-10-10 NOTE — Patient Instructions (Signed)
Please return if not better by Monday.

## 2016-10-10 NOTE — Progress Notes (Signed)
Subjective:    Patient ID: Louis [name], male    DOB: Sep 10, 1946, 70 y.o.   MRN: [id number]  HPI 70 year old male who is diabetic comes in today with about 4 days of feeling like his tongue is swollen and sore. Now the back of his throat hurts as well and he went for the headache this morning. No fevers chills or sweats. Last night he had eat soft mushy foods because it was uncomfortable. No GI symptoms. No worsening or alleviating symptoms.  No recent antibiotic yesterday.  Last A1c was 7.8.   Review of Systems  BP (!) 118/59   Pulse (!) 48   Temp 97.5 F (36.4 C)   SpO2 99%     Allergies  Allergen Reactions  . Hydrocodone Nausea And Vomiting  . Oxycodone Nausea And Vomiting    Past Medical History:  Diagnosis Date  . CAD (coronary artery disease)   . Diabetes mellitus   . Heart failure, diastolic ([place])   . Hyperlipidemia   . Hypertension   . OSA on CPAP   . Personal history of colonic polyps - adenomas 01/28/2014  . Renal insufficiency   . Shortness of breath dyspnea     Past Surgical History:  Procedure Laterality Date  . CARPAL TUNNEL RELEASE    . PILONIDAL CYST EXCISION      Social History   Social History  . Marital status: Married    Spouse name: N/A  . Number of children: 5  . Years of education: N/A   Occupational History  . Warehouse    Social History Main Topics  . Smoking status: Former Smoker    Packs/day: 1.00    Years: 20.00    Types: Cigarettes    Quit date: 03/06/1977  . Smokeless tobacco: Never Used  . Alcohol use No  . Drug use: No  . Sexual activity: Not on file   Other Topics Concern  . Not on file   Social History Narrative  . No narrative on file    Family History  Problem Relation Age of Onset  . Diabetes Mother   . Coronary artery disease Father 64    CABG  . Diabetes Father   . Colon cancer Neg Hx     Outpatient Encounter Prescriptions as of 10/10/2016  Medication Sig  . aspirin 81 MG tablet Take 81 mg by  mouth daily.    Marland Kitchen atorvastatin (LIPITOR) 80 MG tablet Take 1 tablet (80 mg total) by mouth daily.  . blood glucose meter kit and supplies Dispense based on patient and insurance preference. Test blood sugar three times daily. Diagnosis Diabetes ICD - 10 E 11.9  . cloNIDine (CATAPRES) 0.2 MG tablet Take 1 tablet (0.2 mg total) by mouth daily.  . colchicine 0.6 MG tablet Take 1 tablet (0.6 mg total) by mouth 2 (two) times daily.  . furosemide (LASIX) 80 MG tablet Take 1 tablet by mouth 2 (two) times daily.   Marland Kitchen gabapentin (NEURONTIN) 300 MG capsule Take 1 capsule (300 mg total) by mouth 2 (two) times daily.  . hydrALAZINE (APRESOLINE) 100 MG tablet Take 1 tablet (100 mg total) by mouth 3 (three) times daily.  . isosorbide mononitrate (ISMO,MONOKET) 20 MG tablet Take 1 tablet (20 mg total) by mouth 2 (two) times daily at 10 AM and 5 PM.  . metolazone (ZAROXOLYN) 5 MG tablet   . metoprolol succinate (TOPROL-XL) 100 MG 24 hr tablet Take 100 mg by mouth daily.  . nitroGLYCERIN (NITROSTAT)  0.4 MG SL tablet Place 1 tablet (0.4 mg total) under the tongue every 5 (five) minutes as needed for chest pain.  Marland Kitchen NOVOLIN R 100 UNIT/ML injection INJECT 50 UNITS WITH EACH MEAL  . nystatin (MYCOSTATIN) 100000 UNIT/ML suspension Take 5 mLs (500,000 Units total) by mouth 4 (four) times daily. X 1 week. Swish and hold in mouth for at least 2 minutes and then swallow.  . traMADol (ULTRAM) 50 MG tablet 1-2 tabs by mouth Q8 hours, maximum 6 tabs per day.  . triamcinolone ointment (KENALOG) 0.5 % Apply 1 application topically 2 (two) times daily. To affected area, avoid eyes and face   No facility-administered encounter medications on file as of 10/10/2016.          Objective:   Physical Exam  Constitutional: He is oriented to person, place, and time. He appears well-developed and well-nourished.  HENT:  Head: Normocephalic and atraumatic.  Right Ear: External ear normal.  Left Ear: External ear normal.  Nose:  Nose normal.  Tongue is mildly swollen with ridges along the edge. Body white coating on the surface. He has had a small vesicle on the roof of the mouth was the right side. Approximately 3 mm in size.  Eyes: Conjunctivae are normal.  Neck: Neck supple. No thyromegaly present.  Cardiovascular: Normal rate, regular rhythm and normal heart sounds.   Pulmonary/Chest: Effort normal and breath sounds normal.  Lymphadenopathy:    He has no cervical adenopathy.  Neurological: He is alert and oriented to person, place, and time.  Skin: Skin is warm and dry.  Psychiatric: He has a normal mood and affect. His behavior is normal.        Assessment & Plan:  Thrush-diagnosis most consistent with thrush. We'll treat with nystatin swish and swallow. KOH sample collected and will send the lab for further review. If not better by Monday then please call us back.

## 2016-10-11 ENCOUNTER — Ambulatory Visit (INDEPENDENT_AMBULATORY_CARE_PROVIDER_SITE_OTHER): Payer: Medicare Other | Admitting: Physical Therapy

## 2016-10-11 DIAGNOSIS — M25461 Effusion, right knee: Secondary | ICD-10-CM

## 2016-10-11 DIAGNOSIS — M6281 Muscle weakness (generalized): Secondary | ICD-10-CM | POA: Diagnosis not present

## 2016-10-11 DIAGNOSIS — M25661 Stiffness of right knee, not elsewhere classified: Secondary | ICD-10-CM

## 2016-10-11 DIAGNOSIS — M25561 Pain in right knee: Secondary | ICD-10-CM

## 2016-10-11 DIAGNOSIS — R2689 Other abnormalities of gait and mobility: Secondary | ICD-10-CM

## 2016-10-11 NOTE — Therapy (Signed)
[place], [place], [address] Phone: [phone]   Fax:  [phone]  Physical Therapy Treatment  Patient Details  Name: Louis [name] MRN: [id number] Date of Birth: [birth year] Referring Provider: Dr. Dorna Leitz   Encounter Date: 10/11/2016      PT End of Session - 10/11/16 1019    Visit Number 7   Number of Visits 12   Date for PT Re-Evaluation 11/01/16   PT Start Time 1019   PT Stop Time 1109   PT Time Calculation (min) 50 min   Activity Tolerance Patient tolerated treatment well      Past Medical History:  Diagnosis Date  . CAD (coronary artery disease)   . Diabetes mellitus   . Heart failure, diastolic ([place])   . Hyperlipidemia   . Hypertension   . OSA on CPAP   . Personal history of colonic polyps - adenomas 01/28/2014  . Renal insufficiency   . Shortness of breath dyspnea     Past Surgical History:  Procedure Laterality Date  . CARPAL TUNNEL RELEASE    . PILONIDAL CYST EXCISION      There were no vitals filed for this visit.      Subjective Assessment - 10/11/16 1022    Subjective Charlie reports his knee feels good today, did a lot of walking yesterday. Also slept well last night   Patient Stated Goals walk normal, decrease pain, bend the knee better.  Get back into clogging/ dancing.   Currently in Pain? No/denies                         [facility] Adult PT Treatment/Exercise - 10/11/16 0001      Knee/Hip Exercises: Aerobic   Nustep L5x6' legs and arms     Knee/Hip Exercises: Standing   Other Standing Knee Exercises TKE's 10x5sec hold with med sized ball.    Other Standing Knee Exercises BWD walking with focus on Rt knee ext, side stepping, marches & butt kicks  length of gym x 2     Modalities   Modalities Ultrasound;Vasopneumatic     Ultrasound   Ultrasound Location posterior Rt knee   Ultrasound Parameters 100%, 1.32mHz, 1.5 w/cm2   Ultrasound  Goals Pain     Vasopneumatic   Number Minutes Vasopneumatic  15 minutes   Vasopnuematic Location  Knee   Vasopneumatic Pressure Low   Vasopneumatic Temperature  3*                      PT Long Term Goals - 10/09/16 1038      PT LONG TERM GOAL #1   Title I with advanced HEP ( 11/01/16)    Time 6   Period Weeks   Status On-going     PT LONG TERM GOAL #2   Title reports overall reduction in Rt knee pain =/> 75% with walking ( 11/01/16)    Time 6   Period Weeks   Status On-going  25% improvement     PT LONG TERM GOAL #3   Title improve Rt knee motion -2 extension to 120 flexion to assist with climbing stairs at home( 11/01/16)    Time 6   Period Weeks   Status On-going     PT LONG TERM GOAL #4   Title improve Rt hip and knee strength =/> 5-/5 to allow safe ambulation on even/uneven surfaces ( 11/01/16)    Time  6   Period Weeks   Status On-going     PT LONG TERM GOAL #5   Title demo knee edema measurements within 0.50cm of each other ( 11/01/16)    Time 6   Period Weeks   Status On-going  improving     PT LONG TERM GOAL #6   Title walk without obvious gait abnormalities ( 11/01/16)    Time 6   Period Weeks   Status On-going  improving               Plan - 10/11/16 1054    Clinical Impression Statement Charlie did well with different ex today. He did  find walking backwards a challenge.  Had some relief. after the ultrasound to decrease posterior knee pain   Rehab Potential Excellent   PT Frequency 2x / week   PT Duration 6 weeks   PT Treatment/Interventions Moist Heat;Ultrasound;Therapeutic exercise;Dry needling;Taping;Manual techniques;Vasopneumatic Device;Balance training;Neuromuscular re-education;Cryotherapy;Electrical Stimulation;Gait training;Stair training;Passive range of motion;Scar mobilization   Consulted and Agree with Plan of Care Patient      Patient will benefit from skilled therapeutic intervention in order to improve the  following deficits and impairments:  Increased edema, Decreased strength, Pain, Impaired flexibility, Obesity, Difficulty walking, Decreased range of motion  Visit Diagnosis: Stiffness of right knee, not elsewhere classified  Acute pain of right knee  Muscle weakness (generalized)  Other abnormalities of gait and mobility  Swelling of joint, knee, right     Problem List Patient Active Problem List   Diagnosis Date Noted  . Right cervical radiculopathy 05/12/2016  . 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 Gout 04/20/2015  . Osteoarthritis of both acromioclavicular joints 12/03/2014  . Left shoulder pain 11/17/2014  . Acute decompensated heart failure ([place]) 10/05/2014  . Unstable angina pectoris ([place]) 10/05/2014  . Acute diastolic heart failure ([place]) 10/05/2014  . Personal history of colonic polyps - adenomas 01/28/2014  . Bilateral carpal tunnel syndrome 01/09/2014  . Diabetic retinopathy ([place]) 10/31/2013  . Macular edema, diabetic ([place]) 10/31/2013  . Obesity (BMI 30-39.9) 07/31/2013  . Fatigue 03/06/2012  . CAD (coronary artery disease), native coronary artery 05/17/2011  . Nonspecific abnormal unspecified cardiovascular function study 03/01/2011  . Bradycardia 02/08/2011  . Right knee pain 02/08/2011  . Hyperlipidemia   . RBBB 02/01/2011  . Chronic kidney disease, stage 4, severely decreased GFR (HCC) 10/23/2006  . Proteinuria 10/23/2006  . Obstructive sleep apnea 10/05/2006  . Diabetes mellitus with stage 4 chronic kidney disease ([place]) 08/28/2006  . HYPERTENSION, BENIGN SYSTEMIC 08/28/2006    Jeral Pinch PT 10/11/2016, 10:57 AM  [facility] [place], [place], [address] Phone: [phone]   Fax:  [phone]  Name: GAUTHAM HEWINS MRN:  [id number] Date of Birth: 26-Nov-1945

## 2016-10-13 LAB — FUNGAL STAIN

## 2016-10-16 ENCOUNTER — Ambulatory Visit (INDEPENDENT_AMBULATORY_CARE_PROVIDER_SITE_OTHER): Payer: Medicare Other | Admitting: Physical Therapy

## 2016-10-16 DIAGNOSIS — M25661 Stiffness of right knee, not elsewhere classified: Secondary | ICD-10-CM

## 2016-10-16 DIAGNOSIS — R2689 Other abnormalities of gait and mobility: Secondary | ICD-10-CM

## 2016-10-16 DIAGNOSIS — M25561 Pain in right knee: Secondary | ICD-10-CM | POA: Diagnosis not present

## 2016-10-16 DIAGNOSIS — M6281 Muscle weakness (generalized): Secondary | ICD-10-CM | POA: Diagnosis not present

## 2016-10-16 NOTE — Therapy (Signed)
[place], [place], [address] Phone: [phone]   Fax:  [phone]  Physical Therapy Treatment  Patient Details  Name: Louis [name] MRN: [id number] Date of Birth: 1946-07-03 Referring Provider: Dr. Dorna Leitz   Encounter Date: 10/16/2016      PT End of Session - 10/16/16 1101    Visit Number 8   Number of Visits 12   Date for PT Re-Evaluation 11/01/16   PT Start Time 1016   PT Stop Time 1101   PT Time Calculation (min) 45 min   Activity Tolerance Patient tolerated treatment well      Past Medical History:  Diagnosis Date  . CAD (coronary artery disease)   . Diabetes mellitus   . Heart failure, diastolic ([place])   . Hyperlipidemia   . Hypertension   . OSA on CPAP   . Personal history of colonic polyps - adenomas 01/28/2014  . Renal insufficiency   . Shortness of breath dyspnea     Past Surgical History:  Procedure Laterality Date  . CARPAL TUNNEL RELEASE    . PILONIDAL CYST EXCISION      There were no vitals filed for this visit.      Subjective Assessment - 10/16/16 1025    Subjective "I feel like my leg and knee is getting stronger.  I haven't walked with a cane in over a week".    Patient Stated Goals walk normal, decrease pain, bend the knee better.  Get back into clogging/ dancing.   Currently in Pain? Yes   Pain Score 1    Pain Location Knee   Pain Orientation Right;Posterior   Pain Descriptors / Indicators Aching   Aggravating Factors  prolonged sitting and walking    Pain Relieving Factors sleep with pillows supporting legs.             [facility] PT Assessment - 10/16/16 0001      Assessment   Medical Diagnosis Rt knee scope    Referring Provider Dr. Dorna Leitz    Onset Date/Surgical Date 09/13/16   Hand Dominance Right   Next MD Visit 10/26/16     AROM   Right/Left Knee Right   Right Knee Extension -3  after passive stretch   Right Knee Flexion 117   during seated scoot          [facility] Adult PT Treatment/Exercise - 10/16/16 0001      Knee/Hip Exercises: Stretches   Passive Hamstring Stretch Right;30 seconds;3 reps  over pressure with opp hand   Gastroc Stretch Right;Left;2 reps;30 seconds     Knee/Hip Exercises: Aerobic   Nustep L5x6' legs and arms     Knee/Hip Exercises: Standing   Heel Raises Both;1 set;10 reps   Lateral Step Up Right;1 set;10 reps;Hand Hold: 2;Step Height: 6"   Forward Step Up Right;1 set;Step Height: 6";10 reps;Hand Hold: 1   Other Standing Knee Exercises Rt TKE's 10x5sec hold with med sized ball.    Other Standing Knee Exercises (All with UE support) BWD walking with focus on Rt knee ext, side stepping 4 lengths of gym; braiding x 40 ft (challenging)      Knee/Hip Exercises: Seated   Other Seated Knee/Hip Exercises seated scoots at edge of bed x 10 reps     Knee/Hip Exercises: Prone   Prone Knee Hang 2 minutes     Modalities   Modalities Ultrasound     Ultrasound   Ultrasound Location posterior Rt knee  Ultrasound Parameters 100%, 1.1 w/cm2, 8 min    Ultrasound Goals Pain     Manual Therapy   Joint Mobilization Rt knee extension, grade III mobs with foot propped on bolster.                      PT Long Term Goals - 10/16/16 1044      PT LONG TERM GOAL #1   Title I with advanced HEP ( 11/01/16)    Period Weeks   Status On-going     PT LONG TERM GOAL #2   Title reports overall reduction in Rt knee pain =/> 75% with walking ( 11/01/16)    Time 6   Period Weeks   Status On-going  50% reduction, reported 10/16/16     PT LONG TERM GOAL #3   Title improve Rt knee motion -2 extension to 120 flexion to assist with climbing stairs at home( 11/01/16)    Time 6   Period Weeks   Status On-going     PT LONG TERM GOAL #4   Title improve Rt hip and knee strength =/> 5-/5 to allow safe ambulation on even/uneven surfaces ( 11/01/16)    Time 6   Period Weeks   Status On-going      PT LONG TERM GOAL #5   Title demo knee edema measurements within 0.50cm of each other ( 11/01/16)    Time 6   Period Weeks   Status On-going     PT LONG TERM GOAL #6   Title walk without obvious gait abnormalities ( 11/01/16)    Time 6   Period Weeks   Status On-going  improving               Plan - 10/16/16 1246    Clinical Impression Statement Pt demonstrating slight improvement with Rt knee ROM.  Walking backwards was not as challenging today, but braiding was very challenging.  Pt progressing towards established goals.    Rehab Potential Excellent   PT Frequency 2x / week   PT Duration 6 weeks   PT Treatment/Interventions Moist Heat;Ultrasound;Therapeutic exercise;Dry needling;Taping;Manual techniques;Vasopneumatic Device;Balance training;Neuromuscular re-education;Cryotherapy;Electrical Stimulation;Gait training;Stair training;Passive range of motion;Scar mobilization   PT Next Visit Plan Continue progressive Rt knee ROM/ strengthening.    Consulted and Agree with Plan of Care Patient      Patient will benefit from skilled therapeutic intervention in order to improve the following deficits and impairments:  Increased edema, Decreased strength, Pain, Impaired flexibility, Obesity, Difficulty walking, Decreased range of motion  Visit Diagnosis: Stiffness of right knee, not elsewhere classified  Acute pain of right knee  Muscle weakness (generalized)  Other abnormalities of gait and mobility     Problem List Patient Active Problem List   Diagnosis Date Noted  . Right cervical radiculopathy 05/12/2016  . 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 Gout 04/20/2015  . Osteoarthritis of both acromioclavicular joints 12/03/2014  . Left shoulder pain 11/17/2014  . Acute decompensated heart failure ([place]) 10/05/2014  .  Unstable angina pectoris ([place]) 10/05/2014  . Acute diastolic heart failure ([place]) 10/05/2014  . Personal history of colonic polyps - adenomas 01/28/2014  . Bilateral carpal tunnel syndrome 01/09/2014  . Diabetic retinopathy ([place]) 10/31/2013  . Macular edema, diabetic ([place]) 10/31/2013  . Obesity (BMI 30-39.9) 07/31/2013  . Fatigue 03/06/2012  . CAD (coronary artery  disease), native coronary artery 05/17/2011  . Nonspecific abnormal unspecified cardiovascular function study 03/01/2011  . Bradycardia 02/08/2011  . Right knee pain 02/08/2011  . Hyperlipidemia   . RBBB 02/01/2011  . Chronic kidney disease, stage 4, severely decreased GFR (HCC) 10/23/2006  . Proteinuria 10/23/2006  . Obstructive sleep apnea 10/05/2006  . Diabetes mellitus with stage 4 chronic kidney disease ([place]) 08/28/2006  . HYPERTENSION, BENIGN SYSTEMIC 08/28/2006   Kerin Perna, PTA 10/16/16 12:52 PM  [place], [place], [address] Phone: [phone]   Fax:  [phone]  Name: Louis [name] MRN: [id number] Date of Birth: 1945-12-01

## 2016-10-19 ENCOUNTER — Ambulatory Visit (INDEPENDENT_AMBULATORY_CARE_PROVIDER_SITE_OTHER): Payer: Medicare Other | Admitting: Physical Therapy

## 2016-10-19 DIAGNOSIS — M25661 Stiffness of right knee, not elsewhere classified: Secondary | ICD-10-CM

## 2016-10-19 DIAGNOSIS — M6281 Muscle weakness (generalized): Secondary | ICD-10-CM | POA: Diagnosis not present

## 2016-10-19 DIAGNOSIS — H25813 Combined forms of age-related cataract, bilateral: Secondary | ICD-10-CM | POA: Diagnosis not present

## 2016-10-19 DIAGNOSIS — M25561 Pain in right knee: Secondary | ICD-10-CM | POA: Diagnosis not present

## 2016-10-19 DIAGNOSIS — E113211 Type 2 diabetes mellitus with mild nonproliferative diabetic retinopathy with macular edema, right eye: Secondary | ICD-10-CM | POA: Diagnosis not present

## 2016-10-19 DIAGNOSIS — R2689 Other abnormalities of gait and mobility: Secondary | ICD-10-CM | POA: Diagnosis not present

## 2016-10-19 NOTE — Therapy (Signed)
[place], [place], [address] Phone: [phone]   Fax:  [phone]  Physical Therapy Treatment  Patient Details  Name: Louis [name] MRN: [id number] Date of Birth: [birth year] Referring Provider: Dr. Dorna Leitz   Encounter Date: 10/19/2016      PT End of Session - 10/19/16 1628    Visit Number 9   Number of Visits 12   Date for PT Re-Evaluation 11/01/16   PT Start Time 9233   PT Stop Time 1718   PT Time Calculation (min) 63 min   Activity Tolerance Patient tolerated treatment well;No increased pain   Behavior During Therapy WFL for tasks assessed/performed      Past Medical History:  Diagnosis Date  . CAD (coronary artery disease)   . Diabetes mellitus   . Heart failure, diastolic ([place])   . Hyperlipidemia   . Hypertension   . OSA on CPAP   . Personal history of colonic polyps - adenomas 01/28/2014  . Renal insufficiency   . Shortness of breath dyspnea     Past Surgical History:  Procedure Laterality Date  . CARPAL TUNNEL RELEASE    . PILONIDAL CYST EXCISION      There were no vitals filed for this visit.      Subjective Assessment - 10/19/16 1628    Subjective "I think my knee is moving really good".   Pt had shot in eyes today,(eyes dialated) so pt requested to do most exercise seated/supine.     Currently in Pain? Yes   Pain Score 1    Pain Location Knee   Pain Orientation Right;Posterior  calf.    Pain Descriptors / Indicators Aching   Aggravating Factors  prolonged sitting and walking    Pain Relieving Factors rest with pillows supporting legs.             [facility] PT Assessment - 10/19/16 0001      Assessment   Medical Diagnosis Rt knee scope    Referring Provider Dr. Dorna Leitz    Onset Date/Surgical Date 09/13/16   Hand Dominance Right   Next MD Visit 10/26/16          [facility] Adult PT Treatment/Exercise - 10/19/16 0001      Knee/Hip Exercises:  Stretches   Passive Hamstring Stretch Right;30 seconds;3 reps  over pressure with opp hand   Quad Stretch Right;3 reps;30 seconds   Gastroc Stretch Right;Left;30 seconds;3 reps     Knee/Hip Exercises: Aerobic   Nustep L5x6' legs and arms     Knee/Hip Exercises: Standing   Heel Raises Both;1 set;15 reps   Forward Step Up Right;1 set;10 reps;Hand Hold: 2;Step Height: 6"     Knee/Hip Exercises: Seated   Long Arc Quad Strengthening;Right;1 set;15 reps   Long Arc Quad Weight 5 lbs.   Other Seated Knee/Hip Exercises seated scoots at edge of bed x 10 reps;  Rt knee ext with foot on fitter with black/blue band x 12 reps   Marching Limitations seated marching x 15    Marching Weights 5 lbs.     Knee/Hip Exercises: Supine   Bridges Limitations 5 reps x 2 sets     Knee/Hip Exercises: Prone   Hamstring Curl 10 reps   Prone Knee Hang 1 minute  2 reps      Vasopneumatic   Number Minutes Vasopneumatic  15 minutes   Vasopnuematic Location  Knee   Vasopneumatic Pressure Low   Vasopneumatic Temperature  3*  PT Long Term Goals - 10/16/16 1044      PT LONG TERM GOAL #1   Title I with advanced HEP ( 11/01/16)    Period Weeks   Status On-going     PT LONG TERM GOAL #2   Title reports overall reduction in Rt knee pain =/> 75% with walking ( 11/01/16)    Time 6   Period Weeks   Status On-going  50% reduction, reported 10/16/16     PT LONG TERM GOAL #3   Title improve Rt knee motion -2 extension to 120 flexion to assist with climbing stairs at home( 11/01/16)    Time 6   Period Weeks   Status On-going     PT LONG TERM GOAL #4   Title improve Rt hip and knee strength =/> 5-/5 to allow safe ambulation on even/uneven surfaces ( 11/01/16)    Time 6   Period Weeks   Status On-going     PT LONG TERM GOAL #5   Title demo knee edema measurements within 0.50cm of each other ( 11/01/16)    Time 6   Period Weeks   Status On-going     PT LONG TERM GOAL  #6   Title walk without obvious gait abnormalities ( 11/01/16)    Time 6   Period Weeks   Status On-going  improving               Plan - 10/19/16 1709    Rehab Potential Excellent   PT Frequency 2x / week   PT Duration 6 weeks   PT Treatment/Interventions Moist Heat;Ultrasound;Therapeutic exercise;Dry needling;Taping;Manual techniques;Vasopneumatic Device;Balance training;Neuromuscular re-education;Cryotherapy;Electrical Stimulation;Gait training;Stair training;Passive range of motion;Scar mobilization   PT Next Visit Plan 10th visit assessment, Gcode.    Consulted and Agree with Plan of Care Patient      Patient will benefit from skilled therapeutic intervention in order to improve the following deficits and impairments:  Increased edema, Decreased strength, Pain, Impaired flexibility, Obesity, Difficulty walking, Decreased range of motion  Visit Diagnosis: Stiffness of right knee, not elsewhere classified  Acute pain of right knee  Muscle weakness (generalized)  Other abnormalities of gait and mobility     Problem List Patient Active Problem List   Diagnosis Date Noted  . Right cervical radiculopathy 05/12/2016  . 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 Gout 04/20/2015  . Osteoarthritis of both acromioclavicular joints 12/03/2014  . Left shoulder pain 11/17/2014  . Acute decompensated heart failure ([place]) 10/05/2014  . Unstable angina pectoris ([place]) 10/05/2014  . Acute diastolic heart failure ([place]) 10/05/2014  . Personal history of colonic polyps - adenomas 01/28/2014  . Bilateral carpal tunnel syndrome 01/09/2014  . Diabetic retinopathy ([place]) 10/31/2013  . Macular edema, diabetic ([place]) 10/31/2013  . Obesity (BMI 30-39.9) 07/31/2013  . Fatigue 03/06/2012  . CAD (coronary artery disease), native coronary artery  05/17/2011  . Nonspecific abnormal unspecified cardiovascular function study 03/01/2011  . Bradycardia 02/08/2011  . Right knee pain 02/08/2011  . Hyperlipidemia   . RBBB 02/01/2011  . Chronic kidney disease, stage 4, severely decreased GFR (HCC) 10/23/2006  . Proteinuria 10/23/2006  . Obstructive sleep apnea 10/05/2006  . Diabetes mellitus with stage 4 chronic kidney disease ([place]) 08/28/2006  . HYPERTENSION, BENIGN SYSTEMIC 08/28/2006   Kerin Perna, PTA 10/19/16 5:13 PM  [place] [address]  [place], [place], [address] Phone: [phone]   Fax:  [phone]  Name: ALIF PETRAK MRN: [id number] Date of Birth: 1946/04/11

## 2016-10-20 ENCOUNTER — Other Ambulatory Visit: Payer: Self-pay

## 2016-10-20 MED ORDER — ISOSORBIDE MONONITRATE 20 MG PO TABS: 20.0000 mg | ORAL_TABLET | Freq: Two times a day (BID) | ORAL | 3 refills | Status: DC

## 2016-10-23 ENCOUNTER — Ambulatory Visit (INDEPENDENT_AMBULATORY_CARE_PROVIDER_SITE_OTHER): Payer: Medicare Other | Admitting: Physical Therapy

## 2016-10-23 DIAGNOSIS — M25561 Pain in right knee: Secondary | ICD-10-CM | POA: Diagnosis not present

## 2016-10-23 DIAGNOSIS — M6281 Muscle weakness (generalized): Secondary | ICD-10-CM | POA: Diagnosis not present

## 2016-10-23 DIAGNOSIS — M25661 Stiffness of right knee, not elsewhere classified: Secondary | ICD-10-CM

## 2016-10-23 NOTE — Therapy (Signed)
[place], [place], [address] Phone: [phone]   Fax:  [phone]  Physical Therapy Treatment  Patient Details  Name: Louis [name] MRN: [id number] Date of Birth: 12-29-1945 Referring Provider: Dr. Dorna Leitz   Encounter Date: 10/23/2016      PT End of Session - 10/23/16 1025    Visit Number 10   Number of Visits 12   Date for PT Re-Evaluation 11/01/16   PT Start Time 1017   PT Stop Time 1107   PT Time Calculation (min) 50 min   Activity Tolerance Patient tolerated treatment well;No increased pain   Behavior During Therapy WFL for tasks assessed/performed      Past Medical History:  Diagnosis Date  . CAD (coronary artery disease)   . Diabetes mellitus   . Heart failure, diastolic ([place])   . Hyperlipidemia   . Hypertension   . OSA on CPAP   . Personal history of colonic polyps - adenomas 01/28/2014  . Renal insufficiency   . Shortness of breath dyspnea     Past Surgical History:  Procedure Laterality Date  . CARPAL TUNNEL RELEASE    . PILONIDAL CYST EXCISION      There were no vitals filed for this visit.          [facility] PT Assessment - 10/23/16 0001      Assessment   Medical Diagnosis Rt knee scope    Referring Provider Dr. Dorna Leitz    Onset Date/Surgical Date 09/13/16   Hand Dominance Right   Next MD Visit 10/26/16     Observation/Other Assessments   Focus on Therapeutic Outcomes (FOTO)  50% limited (70% at intake, goal of 49%)      AROM   Right/Left Knee Right   Right Knee Extension -5   Right Knee Flexion 121     Strength   Strength Assessment Site Hip;Knee   Right Hip Flexion 5/5   Right Hip Extension 4+/5   Right Hip ABduction --  5-/5   Right Knee Flexion --  5-/5   Right Knee Extension 5/5   Right/Left Ankle --  weak PF bilat          [facility] Adult PT Treatment/Exercise - 10/23/16 0001      Knee/Hip Exercises: Aerobic   Nustep L5x6' legs  and arms     Knee/Hip Exercises: Standing   Forward Step Up Right;1 set;15 reps;Hand Hold: 2;Step Height: 6"   Other Standing Knee Exercises reciprocal patterns on 3-6" steps x 4 reps (3- 3", 2- 6")  some buckling of Rt knee with Lt step down.    Other Standing Knee Exercises semi-tandem stance x 15 sec x 2 reps each side.   Toe taps to 6" step with light to no UE support x 8 reps each leg.      Knee/Hip Exercises: Seated   Other Seated Knee/Hip Exercises seated scoots at edge of bed x 10 reps     Knee/Hip Exercises: Supine   Bridges Limitations 5 reps x 2 sets     Knee/Hip Exercises: Prone   Hamstring Curl --  12 reps; 3# at ankle   Prone Knee Hang 1 minute  2 reps      Modalities   Modalities Cryotherapy;Moist Heat     Moist Heat Therapy   Number Minutes Moist Heat 12 Minutes   Moist Heat Location Knee  Rt posterior      Cryotherapy   Number Minutes Cryotherapy  12 Minutes   Cryotherapy Location Knee  Rt anterior   Type of Cryotherapy Ice pack     Manual Therapy   Manual Therapy Joint mobilization   Joint Mobilization Rt knee extension, grade III mobs with foot propped on bolster.                      PT Long Term Goals - 10/23/16 1120      PT LONG TERM GOAL #1   Title I with advanced HEP ( 11/01/16)    Time 6   Period Weeks   Status On-going     PT LONG TERM GOAL #2   Title reports overall reduction in Rt knee pain =/> 75% with walking ( 11/01/16)    Time 6   Status On-going  50% reduction of pain      PT LONG TERM GOAL #3   Title improve Rt knee motion -2 extension to 120 flexion to assist with climbing stairs at home( 11/01/16)    Time 6   Period Weeks   Status Partially Met     PT LONG TERM GOAL #4   Title improve Rt hip and knee strength =/> 5-/5 to allow safe ambulation on even/uneven surfaces ( 11/01/16)    Time 6   Period Weeks   Status Partially Met     PT LONG TERM GOAL #5   Title demo knee edema measurements within 0.50cm of  each other ( 11/01/16)    Time 6   Period Weeks   Status --  not tested     PT LONG TERM GOAL #6   Title walk without obvious gait abnormalities ( 11/01/16)    Time 6   Period Weeks   Status Achieved               Plan - 10/23/16 1124    Clinical Impression Statement Pt demonstrated improved Rt knee flexion ROM, but decreased Rt knee extension ROM; has partially met LTG 3.  RLE strength has improved; has partially met LTG #4.  His gait has improved; has met LTG #6.   Pt continues with decreased functional weakness of Rt quads, affecting his ability to perform reciprocal pattern on stairs.  His balance is also decreased.  Pt will benefit from continued PT intervention to maximize safety and functional mobility.    Rehab Potential Excellent   PT Frequency 2x / week   PT Duration 6 weeks   PT Treatment/Interventions Moist Heat;Ultrasound;Therapeutic exercise;Dry needling;Taping;Manual techniques;Vasopneumatic Device;Balance training;Neuromuscular re-education;Cryotherapy;Electrical Stimulation;Gait training;Stair training;Passive range of motion;Scar mobilization   PT Next Visit Plan Continue progressive ROM/balance/ strengthening for RLE.    Consulted and Agree with Plan of Care Patient      Patient will benefit from skilled therapeutic intervention in order to improve the following deficits and impairments:  Increased edema, Decreased strength, Pain, Impaired flexibility, Obesity, Difficulty walking, Decreased range of motion  Visit Diagnosis: Stiffness of right knee, not elsewhere classified  Acute pain of right knee  Muscle weakness (generalized)     Problem List Patient Active Problem List   Diagnosis Date Noted  . Right cervical radiculopathy 05/12/2016  . 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 Gout 04/20/2015  .  Osteoarthritis of both acromioclavicular joints 12/03/2014  . Left shoulder pain 11/17/2014  . Acute decompensated heart failure ([place]) 10/05/2014  . Unstable  angina pectoris ([place]) 10/05/2014  . Acute diastolic heart failure ([place]) 10/05/2014  . Personal history of colonic polyps - adenomas 01/28/2014  . Bilateral carpal tunnel syndrome 01/09/2014  . Diabetic retinopathy ([place]) 10/31/2013  . Macular edema, diabetic ([place]) 10/31/2013  . Obesity (BMI 30-39.9) 07/31/2013  . Fatigue 03/06/2012  . CAD (coronary artery disease), native coronary artery 05/17/2011  . Nonspecific abnormal unspecified cardiovascular function study 03/01/2011  . Bradycardia 02/08/2011  . Right knee pain 02/08/2011  . Hyperlipidemia   . RBBB 02/01/2011  . Chronic kidney disease, stage 4, severely decreased GFR (HCC) 10/23/2006  . Proteinuria 10/23/2006  . Obstructive sleep apnea 10/05/2006  . Diabetes mellitus with stage 4 chronic kidney disease ([place]) 08/28/2006  . HYPERTENSION, BENIGN SYSTEMIC 08/28/2006   Kerin Perna, PTA 10/23/16 11:28 AM  Chart reviewed and G-codes updated.  Cassell Clement, PT, CSCS  [facility] [place], [place], [address] Phone: [phone]   Fax:  [phone]  Name: Louis [name] MRN: [id number] Date of Birth: Mar 26, 1946

## 2016-10-26 ENCOUNTER — Ambulatory Visit (INDEPENDENT_AMBULATORY_CARE_PROVIDER_SITE_OTHER): Payer: Medicare Other | Admitting: Physical Therapy

## 2016-10-26 DIAGNOSIS — M25561 Pain in right knee: Secondary | ICD-10-CM

## 2016-10-26 DIAGNOSIS — R0781 Pleurodynia: Secondary | ICD-10-CM | POA: Diagnosis not present

## 2016-10-26 DIAGNOSIS — M25661 Stiffness of right knee, not elsewhere classified: Secondary | ICD-10-CM

## 2016-10-26 DIAGNOSIS — M6281 Muscle weakness (generalized): Secondary | ICD-10-CM

## 2016-10-26 NOTE — Therapy (Signed)
[place], [place], [address] Phone: [phone]   Fax:  [phone]  Physical Therapy Treatment  Patient Details  Name: Louis [name] MRN: [id number] Date of Birth: 20-Jan-1946 Referring Provider: Dr. Dorna Leitz   Encounter Date: 10/26/2016      PT End of Session - 10/26/16 1019    Visit Number 11   Number of Visits 12   Date for PT Re-Evaluation 11/01/16   Authorization Time Period G-codes @ 20th visit (10 visit codes completed)    PT Start Time 1015   PT Stop Time 1114   PT Time Calculation (min) 59 min   Activity Tolerance Patient tolerated treatment well;No increased pain      Past Medical History:  Diagnosis Date  . CAD (coronary artery disease)   . Diabetes mellitus   . Heart failure, diastolic ([place])   . Hyperlipidemia   . Hypertension   . OSA on CPAP   . Personal history of colonic polyps - adenomas 01/28/2014  . Renal insufficiency   . Shortness of breath dyspnea     Past Surgical History:  Procedure Laterality Date  . CARPAL TUNNEL RELEASE    . PILONIDAL CYST EXCISION      There were no vitals filed for this visit.      Subjective Assessment - 10/26/16 1019    Subjective "My knee is feeling more limber". I'm getting close to graduating from here.    Currently in Pain? No/denies   Pain Score 0-No pain   Pain Location Knee            [facility] PT Assessment - 10/26/16 0001      Assessment   Medical Diagnosis Rt knee scope    Referring Provider Dr. Dorna Leitz    Onset Date/Surgical Date 09/13/16   Hand Dominance Right   Next MD Visit 10/26/16     Circumferential Edema   Circumferential - Right 43.1   Circumferential - Left  42.8     AROM   Right/Left Knee Right   Right Knee Extension -3   Right Knee Flexion 121                     [facility] Adult PT Treatment/Exercise - 10/26/16 0001      Knee/Hip Exercises: Stretches   Passive Hamstring  Stretch Right;2 reps;30 seconds   Quad Stretch --   Press photographer Right;2 reps;30 seconds     Knee/Hip Exercises: Aerobic   Nustep L4 x 6' legs and arms     Knee/Hip Exercises: Standing   Forward Step Up Right;1 set;15 reps;Hand Hold: 2;Step Height: 6"   Other Standing Knee Exercises reciprocal patterns on 3-6" steps x 6 reps (3- 3", 2- 6")  some buckling of Rt knee with Lt step down.    Other Standing Knee Exercises semi-tandem stance x 15 sec x 2 reps each side.   Toe taps to 6" step with  no UE support x 12 reps each leg.   tandem walk with Light UE support (forward / backward) x 12 ft.  Two-step 30 ft.   Braiding x 8 ft (challenging)      Knee/Hip Exercises: Seated   Sit to Sand 10 reps  VC for increased wt into RLE.      Knee/Hip Exercises: Supine   Heel Slides AROM;Right;5 reps  (for ROM measurement)     Modalities   Modalities Cryotherapy;Moist Heat     Moist  Heat Therapy   Number Minutes Moist Heat 12 Minutes   Moist Heat Location Knee  Rt posterior      Cryotherapy   Number Minutes Cryotherapy 12 Minutes   Cryotherapy Location Knee  Rt anterior   Type of Cryotherapy Ice pack                     PT Long Term Goals - 10/26/16 1053      PT LONG TERM GOAL #1   Title I with advanced HEP ( 11/01/16)    Time 6   Period Weeks   Status On-going     PT LONG TERM GOAL #2   Title reports overall reduction in Rt knee pain =/> 75% with walking ( 11/01/16)    Time 6   Period Weeks   Status Achieved  reported 75%      PT LONG TERM GOAL #3   Title improve Rt knee motion -2 extension to 120 flexion to assist with climbing stairs at home( 11/01/16)    Time 6   Period Weeks   Status Partially Met     PT LONG TERM GOAL #4   Title improve Rt hip and knee strength =/> 5-/5 to allow safe ambulation on even/uneven surfaces ( 11/01/16)    Time 6   Period Weeks   Status Partially Met     PT LONG TERM GOAL #5   Title demo knee edema measurements within  0.50cm of each other ( 11/01/16)    Time 6   Period Weeks   Status Achieved     PT LONG TERM GOAL #6   Title walk without obvious gait abnormalities ( 11/01/16)    Time 6   Period Weeks   Status Achieved               Plan - 10/26/16 1059    Clinical Impression Statement Pt now has minimal swelling in Rt knee; he has met LTG #5. Pain with walking has reduced 75%: met LTG #2.  Pt was able to complete several exercises before needing a seated rest break. His exercise tolerance has improved.  He is near meeting all his goals.    Rehab Potential Excellent   PT Frequency 2x / week   PT Duration 6 weeks   PT Treatment/Interventions Moist Heat;Ultrasound;Therapeutic exercise;Dry needling;Taping;Manual techniques;Vasopneumatic Device;Balance training;Neuromuscular re-education;Cryotherapy;Electrical Stimulation;Gait training;Stair training;Passive range of motion;Scar mobilization   PT Next Visit Plan Continue progressive ROM/balance/ strengthening for RLE.  Assess readiness for d/c to HEP.    Consulted and Agree with Plan of Care Patient      Patient will benefit from skilled therapeutic intervention in order to improve the following deficits and impairments:  Increased edema, Decreased strength, Pain, Impaired flexibility, Obesity, Difficulty walking, Decreased range of motion  Visit Diagnosis: Stiffness of right knee, not elsewhere classified  Acute pain of right knee  Muscle weakness (generalized)     Problem List Patient Active Problem List   Diagnosis Date Noted  . Right cervical radiculopathy 05/12/2016  . 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 Gout 04/20/2015  . Osteoarthritis of both acromioclavicular joints 12/03/2014  . Left shoulder pain 11/17/2014  . Acute decompensated heart failure ([place]) 10/05/2014  . Unstable  angina pectoris ([place]) 10/05/2014  . Acute diastolic heart failure ([place]) 10/05/2014  . Personal history of colonic polyps -  adenomas 01/28/2014  . Bilateral carpal tunnel syndrome 01/09/2014  . Diabetic retinopathy ([place]) 10/31/2013  . Macular edema, diabetic ([place]) 10/31/2013  . Obesity (BMI 30-39.9) 07/31/2013  . Fatigue 03/06/2012  . CAD (coronary artery disease), native coronary artery 05/17/2011  . Nonspecific abnormal unspecified cardiovascular function study 03/01/2011  . Bradycardia 02/08/2011  . Right knee pain 02/08/2011  . Hyperlipidemia   . RBBB 02/01/2011  . Chronic kidney disease, stage 4, severely decreased GFR (HCC) 10/23/2006  . Proteinuria 10/23/2006  . Obstructive sleep apnea 10/05/2006  . Diabetes mellitus with stage 4 chronic kidney disease ([place]) 08/28/2006  . HYPERTENSION, BENIGN SYSTEMIC 08/28/2006   Kerin Perna, PTA 10/26/16 11:08 AM  [place], [place], [address] Phone: [phone]   Fax:  [phone]  Name: Louis [name] MRN: [id number] Date of Birth: 02-17-1946

## 2016-10-30 ENCOUNTER — Ambulatory Visit (INDEPENDENT_AMBULATORY_CARE_PROVIDER_SITE_OTHER): Payer: Medicare Other | Admitting: Physical Therapy

## 2016-10-30 DIAGNOSIS — M25561 Pain in right knee: Secondary | ICD-10-CM

## 2016-10-30 DIAGNOSIS — M25661 Stiffness of right knee, not elsewhere classified: Secondary | ICD-10-CM

## 2016-10-30 DIAGNOSIS — M6281 Muscle weakness (generalized): Secondary | ICD-10-CM | POA: Diagnosis not present

## 2016-10-30 NOTE — Therapy (Signed)
[place], [place], [address] Phone: [phone]   Fax:  [phone]  Physical Therapy Treatment  Patient Details  Name: Louis [name] MRN: [id number] Date of Birth: 1946/09/20 Referring Provider: Dr. Dorna Leitz   Encounter Date: 10/30/2016      PT End of Session - 10/30/16 1014    Visit Number 12   Number of Visits 12   Date for PT Re-Evaluation 11/01/16   PT Start Time 1020   PT Stop Time 1110   PT Time Calculation (min) 50 min   Activity Tolerance Patient tolerated treatment well   Behavior During Therapy [facility] for tasks assessed/performed     Dictation #1 [id number]  [id number]  Past Medical History:  Diagnosis Date  . CAD (coronary artery disease)   . Diabetes mellitus   . Heart failure, diastolic ([place])   . Hyperlipidemia   . Hypertension   . OSA on CPAP   . Personal history of colonic polyps - adenomas 01/28/2014  . Renal insufficiency   . Shortness of breath dyspnea     Past Surgical History:  Procedure Laterality Date  . CARPAL TUNNEL RELEASE    . PILONIDAL CYST EXCISION      There were no vitals filed for this visit.      Subjective Assessment - 10/30/16 1029    Subjective Pt was released by MD during last visit. "My knee is feeling great".    Currently in Pain? No/denies   Pain Score 0-No pain   Pain Location Knee            [facility] PT Assessment - 10/30/16 0001      Assessment   Medical Diagnosis Rt knee scope    Referring Provider Dr. Dorna Leitz    Onset Date/Surgical Date 09/13/16   Hand Dominance Right   Next MD Visit PRN     Observation/Other Assessments   Focus on Therapeutic Outcomes (FOTO)  42% limited      AROM   AROM Assessment Site Knee   Right/Left Knee Right   Right Knee Extension -2   Right Knee Flexion 117     Strength   Strength Assessment Site Hip;Knee   Right Hip Flexion 5/5   Right Hip Extension --  5-/5   Right/Left  Knee Right   Right Knee Flexion --  5-/5   Right Knee Extension 5/5                     [facility] Adult PT Treatment/Exercise - 10/30/16 0001      Knee/Hip Exercises: Stretches   Passive Hamstring Stretch Right;2 reps;30 seconds   Gastroc Stretch Right;2 reps;30 seconds     Knee/Hip Exercises: Aerobic   Recumbent Bike Trial: very difficult to get full revolutions.    Nustep L4 x 5' legs and arms     Knee/Hip Exercises: Standing   Heel Raises Both;2 sets;10 reps   Forward Step Up Right;1 set;15 reps;Hand Hold: 2;Step Height: 6"   Other Standing Knee Exercises reciprocal patterns on 3-6" steps x 8 reps (3- 3", 2- 6")     Knee/Hip Exercises: Seated   Sit to Sand 10 reps  VC for increased wt into RLE.      Knee/Hip Exercises: Supine   Quad Sets 5 reps;Right  (for ROM measurement)   Heel Slides AROM;Right;5 reps  (for ROM measurement)   Bridges Limitations 5 reps x 2 sets     Knee/Hip Exercises:  Prone   Hamstring Curl 2 sets;10 reps   Prone Knee Hang 1 minute  2 reps      Modalities   Modalities Cryotherapy;Moist Heat     Moist Heat Therapy   Number Minutes Moist Heat 12 Minutes   Moist Heat Location Knee  Rt posterior      Cryotherapy   Number Minutes Cryotherapy 12 Minutes   Cryotherapy Location Knee  Rt anterior   Type of Cryotherapy Ice pack                     PT Long Term Goals - 10/30/16 1038      PT LONG TERM GOAL #1   Title I with advanced HEP ( 11/01/16)    Time 6   Period Weeks   Status Achieved     PT LONG TERM GOAL #2   Title reports overall reduction in Rt knee pain =/> 75% with walking ( 11/01/16)    Time 6   Period Weeks   Status Achieved     PT LONG TERM GOAL #3   Title improve Rt knee motion -2 extension to 120 flexion to assist with climbing stairs at home( 11/01/16)    Time 6   Period Weeks   Status Partially Met     PT LONG TERM GOAL #4   Title improve Rt hip and knee strength =/> 5-/5 to allow safe  ambulation on even/uneven surfaces ( 11/01/16)    Time 6   Period Weeks   Status Achieved     PT LONG TERM GOAL #5   Title demo knee edema measurements within 0.50cm of each other ( 11/01/16)    Time 6   Period Weeks   Status Achieved     PT LONG TERM GOAL #6   Title walk without obvious gait abnormalities ( 11/01/16)    Time 6   Period Weeks   Status Achieved               Plan - 10/30/16 1059    Clinical Impression Statement Pt tolerated all exercises well, with minimal pain.  He is now able to perform reciprocal steps without RLE buckling.  He has partially met his goals but reports he is satisfied with current level of function; pt requests to d/c to HEP at this time.    Rehab Potential Excellent   PT Frequency 2x / week   PT Duration 6 weeks   PT Treatment/Interventions Moist Heat;Ultrasound;Therapeutic exercise;Dry needling;Taping;Manual techniques;Vasopneumatic Device;Balance training;Neuromuscular re-education;Cryotherapy;Electrical Stimulation;Gait training;Stair training;Passive range of motion;Scar mobilization   PT Next Visit Plan Spoke to supervising PT; will d/c per pt request.    Consulted and Agree with Plan of Care Patient      Patient will benefit from skilled therapeutic intervention in order to improve the following deficits and impairments:  Increased edema, Decreased strength, Pain, Impaired flexibility, Obesity, Difficulty walking, Decreased range of motion  Visit Diagnosis: Stiffness of right knee, not elsewhere classified  Acute pain of right knee  Muscle weakness (generalized)     Problem List Patient Active Problem List   Diagnosis Date Noted  . Right cervical radiculopathy 05/12/2016  . 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 Gout 04/20/2015  . Osteoarthritis of both acromioclavicular  joints 12/03/2014  . Left shoulder pain 11/17/2014  . Acute decompensated heart failure ([place]) 10/05/2014  .  Unstable angina pectoris ([place]) 10/05/2014  . Acute diastolic heart failure ([place]) 10/05/2014  . Personal history of colonic polyps - adenomas 01/28/2014  . Bilateral carpal tunnel syndrome 01/09/2014  . Diabetic retinopathy ([place]) 10/31/2013  . Macular edema, diabetic ([place]) 10/31/2013  . Obesity (BMI 30-39.9) 07/31/2013  . Fatigue 03/06/2012  . CAD (coronary artery disease), native coronary artery 05/17/2011  . Nonspecific abnormal unspecified cardiovascular function study 03/01/2011  . Bradycardia 02/08/2011  . Right knee pain 02/08/2011  . Hyperlipidemia   . RBBB 02/01/2011  . Chronic kidney disease, stage 4, severely decreased GFR (H[facility]) 10/23/2006  . Proteinuria 10/23/2006  . Obstructive sleep apnea 10/05/2006  . Diabetes mellitus with stage 4 chronic kidney disease ([place]) 08/28/2006  . HYPERTENSION, BENIGN SYSTEMIC 08/28/2006   Kerin Perna, PTA 10/30/16 1:18 PM [address] [place], [place], [address] Phone: [phone]   Fax:  [phone]  Name: Louis [name] MRN: [id number] Date of Birth: 26-Feb-1946  PHYSICAL THERAPY DISCHARGE SUMMARY  Visits from Start of Care: 12  Current functional level related to goals / functional outcomes: See above for current function   Remaining deficits: Slight limitation in knee ROM   Education / Equipment: HEP Plan: Patient agrees to discharge.  Patient goals were partially met. Patient is being discharged due to being pleased with the current functional level.  ?????    Jeral Pinch, PT 10/30/16 1:20 PM

## 2016-10-31 ENCOUNTER — Encounter: Payer: Self-pay | Admitting: Family Medicine

## 2016-11-02 ENCOUNTER — Encounter: Payer: Medicare Other | Admitting: Physical Therapy

## 2016-11-23 ENCOUNTER — Encounter: Payer: Medicare Other | Admitting: Family Medicine

## 2016-11-23 DIAGNOSIS — E113212 Type 2 diabetes mellitus with mild nonproliferative diabetic retinopathy with macular edema, left eye: Secondary | ICD-10-CM | POA: Diagnosis not present

## 2016-11-27 ENCOUNTER — Encounter: Payer: Self-pay | Admitting: Family Medicine

## 2016-11-27 ENCOUNTER — Ambulatory Visit (INDEPENDENT_AMBULATORY_CARE_PROVIDER_SITE_OTHER): Payer: Medicare Other | Admitting: Family Medicine

## 2016-11-27 VITALS — BP 126/56 | HR 57 | Ht 65.0 in | Wt 232.0 lb

## 2016-11-27 DIAGNOSIS — L853 Xerosis cutis: Secondary | ICD-10-CM | POA: Diagnosis not present

## 2016-11-27 DIAGNOSIS — R42 Dizziness and giddiness: Secondary | ICD-10-CM

## 2016-11-27 DIAGNOSIS — K5901 Slow transit constipation: Secondary | ICD-10-CM | POA: Diagnosis not present

## 2016-11-27 DIAGNOSIS — L299 Pruritus, unspecified: Secondary | ICD-10-CM | POA: Diagnosis not present

## 2016-11-27 NOTE — Progress Notes (Signed)
Subjective:    Patient ID: Louis [name], male    DOB: Nov 15, 1946, 71 y.o.   MRN: [id number]  HPI Patient says he is battling with constipation for about 2 weeks. He's tried multiple products including Dulcolax, magnesium liquid, and even an enema with no significant results. Last night he took smooth move tea and did use 1 dose of MiraLAX. He did have a bowel movement about 3 days ago and says that was solid and felt like a baseball. It was brown in color. No sign of blood. Says his abdomen feels tight and bloated  He has also complained of very dry skin. He says it's been itchy to the point that he's actually been bruising himself. He also has some balance issues since he had his knee surgery. He says it comes and goes. He says sometimes more when he stands up and moves quickly. Sometimes it can happen with him just seen there without moving. He says it's not related to head position. He denies any ear pain or problems. He has had some vision problems and has a consultation for possible cataract surgery this week.  Review of Systems  BP (!) 126/56   Pulse (!) 57   Ht 5' 5"  (1.651 m)   Wt 232 lb (105.2 kg)   SpO2 100%   BMI 38.61 kg/m     Allergies  Allergen Reactions  . Hydrocodone Nausea And Vomiting  . Oxycodone Nausea And Vomiting    Past Medical History:  Diagnosis Date  . CAD (coronary artery disease)   . Diabetes mellitus   . Heart failure, diastolic ([place])   . Hyperlipidemia   . Hypertension   . OSA on CPAP   . Personal history of colonic polyps - adenomas 01/28/2014  . Renal insufficiency   . Shortness of breath dyspnea     Past Surgical History:  Procedure Laterality Date  . CARPAL TUNNEL RELEASE    . KNEE ARTHROSCOPY Right 09/13/2016   [facility], Dr. Dorna Leitz  . PILONIDAL CYST EXCISION      Social History   Social History  . Marital status: Married    Spouse name: N/A  . Number of children: 5  . Years of education: N/A   Occupational  History  . Warehouse    Social History Main Topics  . Smoking status: Former Smoker    Packs/day: 1.00    Years: 20.00    Types: Cigarettes    Quit date: 03/06/1977  . Smokeless tobacco: Never Used  . Alcohol use No  . Drug use: No  . Sexual activity: Not on file   Other Topics Concern  . Not on file   Social History Narrative  . No narrative on file    Family History  Problem Relation Age of Onset  . Diabetes Mother   . Coronary artery disease Father 37    CABG  . Diabetes Father   . Colon cancer Neg Hx     Outpatient Encounter Prescriptions as of 11/27/2016  Medication Sig  . aspirin 81 MG tablet Take 81 mg by mouth daily.    Marland Kitchen atorvastatin (LIPITOR) 80 MG tablet Take 1 tablet (80 mg total) by mouth daily.  . blood glucose meter kit and supplies Dispense based on patient and insurance preference. Test blood sugar three times daily. Diagnosis Diabetes ICD - 10 E 11.9  . cloNIDine (CATAPRES) 0.2 MG tablet Take 1 tablet (0.2 mg total) by mouth daily.  . colchicine 0.6 MG  tablet Take 1 tablet (0.6 mg total) by mouth 2 (two) times daily.  . furosemide (LASIX) 80 MG tablet Take 1 tablet by mouth 2 (two) times daily.   Marland Kitchen gabapentin (NEURONTIN) 300 MG capsule Take 1 capsule (300 mg total) by mouth 2 (two) times daily.  . hydrALAZINE (APRESOLINE) 100 MG tablet Take 1 tablet (100 mg total) by mouth 3 (three) times daily.  . isosorbide mononitrate (ISMO,MONOKET) 20 MG tablet Take 1 tablet (20 mg total) by mouth 2 (two) times daily at 10 AM and 5 PM.  . metolazone (ZAROXOLYN) 5 MG tablet   . metoprolol succinate (TOPROL-XL) 100 MG 24 hr tablet Take 100 mg by mouth daily.  . nitroGLYCERIN (NITROSTAT) 0.4 MG SL tablet Place 1 tablet (0.4 mg total) under the tongue every 5 (five) minutes as needed for chest pain.  Marland Kitchen NOVOLIN R 100 UNIT/ML injection INJECT 50 UNITS WITH EACH MEAL  . triamcinolone ointment (KENALOG) 0.5 % Apply 1 application topically 2 (two) times daily. To affected area,  avoid eyes and face  . [DISCONTINUED] nystatin (MYCOSTATIN) 100000 UNIT/ML suspension Take 5 mLs (500,000 Units total) by mouth 4 (four) times daily. X 1 week. Swish and hold in mouth for at least 2 minutes and then swallow.  . [DISCONTINUED] traMADol (ULTRAM) 50 MG tablet 1-2 tabs by mouth Q8 hours, maximum 6 tabs per day.   No facility-administered encounter medications on file as of 11/27/2016.          Objective:   Physical Exam  Constitutional: He is oriented to person, place, and time. He appears well-developed and well-nourished.  HENT:  Head: Normocephalic and atraumatic.  Cardiovascular: Normal rate, regular rhythm and normal heart sounds.   Pulmonary/Chest: Effort normal and breath sounds normal.  Abdominal: Soft. Bowel sounds are normal. He exhibits distension. He exhibits no mass. There is tenderness. There is no rebound and no guarding.  Tender in the left lower quadrant. Increased tympany.  Neurological: He is alert and oriented to person, place, and time.  Skin: Skin is warm and dry.  Psychiatric: He has a normal mood and affect. His behavior is normal.        Assessment & Plan:  Constipation-recommend adding one bottle of MiraLAX to a large bottle of Gatorade. He can drink 8 ounces every 15 minutes until this is completely benign. After that starting tomorrow he can take 1 capful of MiraLAX mixed with 16 ounces of fluid twice a day until he has soft bowel movements.  Recommend that we check the thyroid since he has had recent onset of constipation and very dry skin.  Itching-we'll check liver enzymes.  Cataracts-has consultation for surgery later this week.  Dizziness-unclear etiology. He says he has checked his blood pressure when it happens and his blood pressures are normal. It does not mean seem to be positional per se though it does happen often when he stands up quickly and then tries to move.

## 2016-11-28 ENCOUNTER — Ambulatory Visit: Payer: Medicare Other | Admitting: Family Medicine

## 2016-11-28 LAB — CBC WITH DIFFERENTIAL/PLATELET
Basophils Absolute: 0 cells/uL (ref 0–200)
Basophils Relative: 0 %
EOS PCT: 3 %
Eosinophils Absolute: 288 cells/uL (ref 15–500)
HCT: 38.4 % — ABNORMAL LOW (ref 38.5–50.0)
Hemoglobin: 12.5 g/dL — ABNORMAL LOW (ref 13.2–17.1)
LYMPHS ABS: 1344 {cells}/uL (ref 850–3900)
LYMPHS PCT: 14 %
MCH: 29.5 pg (ref 27.0–33.0)
MCHC: 32.6 g/dL (ref 32.0–36.0)
MCV: 90.6 fL (ref 80.0–100.0)
MPV: 11.2 fL (ref 7.5–12.5)
Monocytes Absolute: 768 cells/uL (ref 200–950)
Monocytes Relative: 8 %
NEUTROS PCT: 75 %
Neutro Abs: 7200 cells/uL (ref 1500–7800)
Platelets: 254 10*3/uL (ref 140–400)
RBC: 4.24 MIL/uL (ref 4.20–5.80)
RDW: 17.2 % — AB (ref 11.0–15.0)
WBC: 9.6 10*3/uL (ref 3.8–10.8)

## 2016-11-28 LAB — COMPLETE METABOLIC PANEL WITH GFR
ALT: 11 U/L (ref 9–46)
AST: 23 U/L (ref 10–35)
Albumin: 3.6 g/dL (ref 3.6–5.1)
Alkaline Phosphatase: 84 U/L (ref 40–115)
BUN: 77 mg/dL — AB (ref 7–25)
CHLORIDE: 93 mmol/L — AB (ref 98–110)
CO2: 27 mmol/L (ref 20–31)
Calcium: 9.3 mg/dL (ref 8.6–10.3)
Creat: 2.65 mg/dL — ABNORMAL HIGH (ref 0.70–1.18)
GFR, Est African American: 27 mL/min — ABNORMAL LOW (ref >=60)
GFR, Est Non African American: 23 mL/min — ABNORMAL LOW (ref >=60)
GLUCOSE: 125 mg/dL — AB (ref 65–99)
POTASSIUM: 3.4 mmol/L — AB (ref 3.5–5.3)
SODIUM: 135 mmol/L (ref 135–146)
Total Bilirubin: 0.6 mg/dL (ref 0.2–1.2)
Total Protein: 6.4 g/dL (ref 6.1–8.1)

## 2016-11-28 LAB — TSH: TSH: 1.85 m[IU]/L (ref 0.40–4.50)

## 2016-11-29 DIAGNOSIS — E113213 Type 2 diabetes mellitus with mild nonproliferative diabetic retinopathy with macular edema, bilateral: Secondary | ICD-10-CM | POA: Diagnosis not present

## 2016-11-29 DIAGNOSIS — H2513 Age-related nuclear cataract, bilateral: Secondary | ICD-10-CM | POA: Diagnosis not present

## 2016-11-30 DIAGNOSIS — E113213 Type 2 diabetes mellitus with mild nonproliferative diabetic retinopathy with macular edema, bilateral: Secondary | ICD-10-CM | POA: Diagnosis not present

## 2016-12-05 ENCOUNTER — Other Ambulatory Visit: Payer: Self-pay | Admitting: Family Medicine

## 2016-12-05 DIAGNOSIS — M1000 Idiopathic gout, unspecified site: Secondary | ICD-10-CM

## 2016-12-05 DIAGNOSIS — M10031 Idiopathic gout, right wrist: Secondary | ICD-10-CM

## 2016-12-08 ENCOUNTER — Other Ambulatory Visit: Payer: Self-pay | Admitting: Family Medicine

## 2016-12-08 DIAGNOSIS — M10031 Idiopathic gout, right wrist: Secondary | ICD-10-CM

## 2016-12-08 DIAGNOSIS — M1000 Idiopathic gout, unspecified site: Secondary | ICD-10-CM

## 2016-12-11 ENCOUNTER — Ambulatory Visit: Payer: Medicare Other | Admitting: Family Medicine

## 2016-12-12 ENCOUNTER — Ambulatory Visit: Payer: Medicare Other | Admitting: Family Medicine

## 2016-12-12 ENCOUNTER — Encounter: Payer: Self-pay | Admitting: Family Medicine

## 2016-12-12 ENCOUNTER — Ambulatory Visit (INDEPENDENT_AMBULATORY_CARE_PROVIDER_SITE_OTHER): Payer: Medicare Other | Admitting: Family Medicine

## 2016-12-12 VITALS — BP 119/61 | HR 48

## 2016-12-12 VITALS — BP 100/70 | HR 46 | Temp 98.7°F | Resp 18 | Ht 68.0 in | Wt 232.1 lb

## 2016-12-12 DIAGNOSIS — Z1331 Encounter for screening for depression: Secondary | ICD-10-CM

## 2016-12-12 DIAGNOSIS — Z23 Encounter for immunization: Secondary | ICD-10-CM

## 2016-12-12 DIAGNOSIS — K5909 Other constipation: Secondary | ICD-10-CM | POA: Diagnosis not present

## 2016-12-12 DIAGNOSIS — M10031 Idiopathic gout, right wrist: Secondary | ICD-10-CM | POA: Diagnosis not present

## 2016-12-12 DIAGNOSIS — Z0001 Encounter for general adult medical examination with abnormal findings: Secondary | ICD-10-CM | POA: Diagnosis not present

## 2016-12-12 DIAGNOSIS — M1000 Idiopathic gout, unspecified site: Secondary | ICD-10-CM | POA: Diagnosis not present

## 2016-12-12 DIAGNOSIS — H259 Unspecified age-related cataract: Secondary | ICD-10-CM | POA: Diagnosis not present

## 2016-12-12 DIAGNOSIS — Z6835 Body mass index (BMI) 35.0-35.9, adult: Secondary | ICD-10-CM

## 2016-12-12 DIAGNOSIS — Z0000 Encounter for general adult medical examination without abnormal findings: Secondary | ICD-10-CM

## 2016-12-12 LAB — HEMOGLOBIN A1C: Hemoglobin A1C: 6.2

## 2016-12-12 LAB — HM DIABETES EYE EXAM

## 2016-12-12 MED ORDER — ALLOPURINOL 300 MG PO TABS: 300.0000 mg | ORAL_TABLET | Freq: Two times a day (BID) | ORAL | 3 refills | Status: DC

## 2016-12-12 NOTE — Progress Notes (Signed)
Subjective:   Louis [name] is a 71 y.o. male who presents for Medicare Annual/Subsequent preventive examination.  Review of Systems:   Cardiac Risk Factors include: advanced age (>48mn, >>42women);diabetes mellitus;dyslipidemia;family history of premature cardiovascular disease;hypertension;male gender;obesity (BMI >30kg/m2);sedentary lifestyle     Objective:    Vitals: BP 100/70   Pulse (!) 46   Temp 98.7 F (37.1 C) (Oral)   Resp 18   Ht _0  (1.727 m)   Wt 232 lb 1.6 oz (105.3 kg)   SpO2 98%   BMI 35.29 kg/m   Body mass index is 35.29 kg/m.  Tobacco History  Smoking Status  . Former Smoker  . Packs/day: 0.50  . Years: 20.00  . Types: Cigarettes  . Quit date: 03/06/1977  Smokeless Tobacco  . Never Used     Counseling given: Not Answered   Past Medical History:  Diagnosis Date  . CAD (coronary artery disease)   . Diabetes mellitus   . Heart failure, diastolic ([place]   . Hyperlipidemia   . Hypertension   . Macular edema 2014  . OSA on CPAP   . Personal history of colonic polyps - adenomas 01/28/2014  . Renal insufficiency   . Shortness of breath dyspnea    Past Surgical History:  Procedure Laterality Date  . CARPAL TUNNEL RELEASE    . KNEE ARTHROSCOPY Right 09/13/2016   [facility], Dr. JDorna Leitz . PILONIDAL CYST EXCISION     Family History  Problem Relation Age of Onset  . Diabetes Mother   . Kidney disease Mother   . Diabetes Father   . Coronary artery disease Father   . Diabetes Brother   . Diabetes Sister   . Cancer Maternal Uncle   . Diabetes Maternal Grandmother   . Cancer Paternal Grandmother   . Heart attack Paternal Grandfather   . Colon cancer Neg Hx    History  Sexual Activity  . Sexual activity: No    Outpatient Encounter Prescriptions as of 12/12/2016  Medication Sig  . allopurinol (ZYLOPRIM) 300 MG tablet Take 1 tablet (300 mg total) by mouth 2 (two) times daily.  .Marland Kitchenaspirin 81 MG tablet Take 81 mg by mouth  daily.    .Marland Kitchenatorvastatin (LIPITOR) 80 MG tablet Take 1 tablet (80 mg total) by mouth daily.  . blood glucose meter kit and supplies Dispense based on patient and insurance preference. Test blood sugar three times daily. Diagnosis Diabetes ICD - 10 E 11.9  . cloNIDine (CATAPRES) 0.2 MG tablet Take 1 tablet (0.2 mg total) by mouth daily.  . colchicine 0.6 MG tablet Take 1 tablet (0.6 mg total) by mouth 2 (two) times daily.  . furosemide (LASIX) 80 MG tablet Take 1 tablet by mouth 2 (two) times daily.   .Marland Kitchengabapentin (NEURONTIN) 300 MG capsule Take 1 capsule (300 mg total) by mouth 2 (two) times daily.  . hydrALAZINE (APRESOLINE) 100 MG tablet Take 1 tablet (100 mg total) by mouth 3 (three) times daily.  . isosorbide mononitrate (ISMO,MONOKET) 20 MG tablet Take 1 tablet (20 mg total) by mouth 2 (two) times daily at 10 AM and 5 PM.  . metolazone (ZAROXOLYN) 5 MG tablet   . metoprolol succinate (TOPROL-XL) 100 MG 24 hr tablet Take 100 mg by mouth daily.  .Marland KitchenNOVOLIN R 100 UNIT/ML injection TID with meals using a sliding scale  . [DISCONTINUED] allopurinol (ZYLOPRIM) 300 MG tablet TAKE 2 TABLETS (600 MG TOTAL) BY MOUTH 2 (TWO)  TIMES DAILY.  . nitroGLYCERIN (NITROSTAT) 0.4 MG SL tablet Place 1 tablet (0.4 mg total) under the tongue every 5 (five) minutes as needed for chest pain.  Marland Kitchen triamcinolone ointment (KENALOG) 0.5 % Apply 1 application topically 2 (two) times daily. To affected area, avoid eyes and face   No facility-administered encounter medications on file as of 12/12/2016.     Activities of Daily Living In your present state of health, do you have any difficulty performing the following activities: 12/12/2016 12/12/2016  Hearing? N N  Vision? Y Y  Difficulty concentrating or making decisions? N N  Walking or climbing stairs? N N  Dressing or bathing? N N  Doing errands, shopping? N N  Preparing Food and eating ? N N  Using the Toilet? N N  In the past six months, have you accidently leaked  urine? Y Y  Do you have problems with loss of bowel control? N N  Managing your Medications? N N  Managing your Finances? N N  Housekeeping or managing your Housekeeping? N N  Some recent data might be hidden    Patient Care Team: [name], MD as PCP - General Lonna Duval, MD as Referring Physician (Endocrinology) Fleet Contras, MD as Consulting Physician (Nephrology) Gerarda Fraction, MD as Referring Physician (Ophthalmology) Lyndee Hensen (Chiropractic Medicine)   Assessment:     Exercise Activities and Dietary recommendations Current Exercise Habits: The patient does not participate in regular exercise at present, Exercise limited by: cardiac condition(s)  Goals      Patient Stated   . Eat more fruits and vegetables (pt-stated)      Fall Risk Fall Risk  12/12/2016 12/12/2016 02/17/2016  Falls in the past year? No No No  Risk for fall due to : Impaired vision - -  Risk for fall due to (comments): Pt wears OTC reading glasses and has bilateral cataracts anduntil he has those removed in March, 2018, he states he will likelly continue to have diff. seeing. - -   Depression Screen PHQ 2/9 Scores 12/12/2016 12/12/2016 02/17/2016  PHQ - 2 Score 0 0 0    Cognitive Function     6CIT Screen 12/12/2016 12/12/2016  What Year? 0 points 0 points  What month? 0 points 0 points  What time? 0 points 0 points  Count back from 20 0 points 0 points  Months in reverse 0 points 0 points  Repeat phrase 0 points 0 points  Total Score 0 0    Immunization History  Administered Date(s) Administered  . Influenza Split 08/29/2012  . Influenza Whole 10/16/2005, 09/19/2007  . Influenza,inj,Quad PF,36+ Mos 07/31/2013, 10/13/2014, 08/12/2015  . Pneumococcal Conjugate-13 04/27/2015  . Pneumococcal Polysaccharide-23 08/10/2006, 12/12/2016  . Td 10/16/2005  . Tdap 02/17/2016   Screening Tests Health Maintenance  Topic Date Due  . Hepatitis C Screening  [birth year]  . ZOSTAVAX   10/27/2005  . PNA vac Low Risk Adult (2 of 2 - PPSV23) 04/26/2016  . HEMOGLOBIN A1C  07/30/2016  . OPHTHALMOLOGY EXAM  10/11/2016  . INFLUENZA VACCINE  02/18/2024 (Originally 06/20/2016)  . COLONOSCOPY  01/22/2017  . URINE MICROALBUMIN  01/27/2017  . FOOT EXAM  04/11/2017  . TETANUS/TDAP  02/16/2026      Plan:    During the course of the visit the patient was educated and counseled about the following appropriate screening and preventive services:   Vaccines to include Pneumoccal, Influenza, Hepatitis B, Td, Zostavax, HCV  Electrocardiogram  Cardiovascular Disease  Colorectal cancer screening  Diabetes screening  Prostate Cancer Screening  Glaucoma screening  Nutrition counseling   Smoking cessation counseling  Patient Instructions (the written plan) was given to the patient.    Nestor Lewandowsky, RN  12/12/2016

## 2016-12-12 NOTE — Patient Instructions (Signed)
Benign Prostatic Hypertrophy The prostate gland is part of the reproductive system of men. A normal prostate is about the size and shape of a walnut. The prostate gland produces a fluid that is mixed with sperm to make semen. This gland surrounds the urethra and is located in front of the rectum and just below the bladder. The bladder is where urine is stored. The urethra is the tube through which urine passes from the bladder to get out of the body. The prostate grows as a man ages. An enlarged prostate not caused by cancer is called benign prostatic hypertrophy (BPH). An enlarged prostate can press on the urethra. This can make it harder to pass urine. In the early stages of enlargement, the bladder can get by with a narrowed urethra by forcing the urine through. If the problem gets worse, medical or surgical treatment may be required. This condition should be followed by your health care provider. The accumulation of urine in the bladder can cause infection. Back pressure and infection can progress to bladder damage and kidney (renal) failure. If needed, your health care provider may refer you to a specialist in kidney and prostate disease (urologist). What are the causes? BPH is a common health problem in men older than 50 years. This condition is a normal part of aging. However, not all men will develop problems from this condition. If the enlargement grows away from the urethra, then there will not be any compression of the urethra and resistance to urine flow.If the growth is toward the urethra and compresses it, you will experience difficulty urinating. What are the signs or symptoms?  Not able to completely empty your bladder.  Getting up often during the night to urinate.  Need to urinate frequently during the day.  Difficultly starting urine flow.  Decrease in size and strength of your urine stream.  Dribbling after urination.  Pain on urination (more common with  infection).  Inability to pass urine. This needs immediate treatment.  The development of a urinary tract infection. How is this diagnosed? These tests will help your health care provider understand your problem:  A thorough history and physical examination.  A urination history, with the number of times you urinate, the amounts of urine, the strength of the urine stream, and the feeling of emptiness or fullness after urinating.  A postvoid bladder scan that measures any amount of urine that may remain in your bladder after you finish urinating.  Digital rectal exam. In a rectal exam, your health care provider checks your prostate by putting a gloved, lubricated finger into your rectum to feel the back of your prostate gland. This exam detects the size of your gland and abnormal lumps or growths.  Exam of your urine (urinalysis).  Prostate specific antigen (PSA) screening. This is a blood test used to screen for prostate cancer.  Rectal ultrasonography. This test uses sound waves to electronically produce a picture of your prostate gland. How is this treated? Once symptoms begin, your health care provider will monitor your condition. Of the men with this condition, one third will have symptoms that stabilize, one third will have symptoms that improve, and one third will have symptoms that progress in the first year. Mild symptoms may not need treatment. Simple observation and yearly exams may be all that is required. Medicines and surgery are options for more severe problems. Your health care provider can help you make an informed decision for what is best. Two classes of medicines  are available for relief of prostate symptoms:  Medicines that shrink the prostate. This helps relieve symptoms. These medicines take time to work, and it may be months before any improvement is seen.  Uncommon side effects include problems with sexual function.  Medicines to relax the muscle of the prostate.  This also relieves the obstruction by reducing any compression on the urethra.This group of medicines work much faster than those that reduce the size of the prostate gland. Usually, one can experience improvement in days to weeks..  Side effects can include dizziness, fatigue, lightheadedness, and retrograde ejaculation (diminished volume of ejaculate). Several types of surgical treatments are available for relief of prostate symptoms:  Transurethral resection of the prostate (TURP)-In this treatment, an instrument is inserted through opening at the tip of the penis. It is used to cut away pieces of the inner core of the prostate. The pieces are removed through the same opening of the penis. This removes the obstruction and helps get rid of the symptoms.  Transurethral incision (TUIP)-In this procedure, small cuts are made in the prostate. This lessens the prostates pressure on the urethra.  Transurethral microwave thermotherapy (TUMT)-This procedure uses microwaves to create heat. The heat destroys and removes a small amount of prostate tissue.  Transurethral needle ablation (TUNA)-This is a procedure that uses radio frequencies to do the same as TUMT.  Interstitial laser coagulation (ILC)-This is a procedure that uses a laser to do the same as TUMT and TUNA.  Transurethral electrovaporization (TUVP)-This is a procedure that uses electrodes to do the same as the procedures listed above. Contact a health care provider if:  You develop a fever.  There is unexplained back pain.  Symptoms are not helped by medicines prescribed.  You develop side effects from the medicine you are taking.  Your urine becomes very dark or has a bad smell.  Your lower abdomen becomes distended and you have difficulty passing your urine. Get help right away if:  You are suddenly unable to urinate. This is an emergency. You should be seen immediately.  There are large amounts of blood or clots in the  urine.  Your urinary problems become unmanageable.  You develop lightheadedness, severe dizziness, or you feel faint.  You develop moderate to severe low back or flank pain.  You develop chills or fever. This information is not intended to replace advice given to you by your health care provider. Make sure you discuss any questions you have with your health care provider. Document Released: 11/06/2005 Document Revised: 04/19/2016 Document Reviewed: 05/22/2013 Elsevier Interactive Patient Education  2017 [employer]  Fat and Cholesterol Restricted Diet High levels of fat and cholesterol in your blood may lead to various health problems, such as diseases of the heart, blood vessels, gallbladder, liver, and pancreas. Fats are concentrated sources of energy that come in various forms. Certain types of fat, including saturated fat, may be harmful in excess. Cholesterol is a substance needed by your body in small amounts. Your body makes all the cholesterol it needs. Excess cholesterol comes from the food you eat. When you have high levels of cholesterol and saturated fat in your blood, health problems can develop because the excess fat and cholesterol will gather along the walls of your blood vessels, causing them to narrow. Choosing the right foods will help you control your intake of fat and cholesterol. This will help keep the levels of these substances in your blood within normal limits and reduce your risk of disease.  What is my plan? Your health care provider recommends that you:  Limit your fat intake to ______% or less of your total calories per day.  Limit the amount of cholesterol in your diet to less than _________mg per day.  Eat 20-30 grams of fiber each day. What types of fat should I choose?  Choose healthy fats more often. Choose monounsaturated and polyunsaturated fats, such as olive and canola oil, flaxseeds, walnuts, almonds, and seeds.  Eat more omega-3 fats. Good  choices include salmon, mackerel, sardines, tuna, flaxseed oil, and ground flaxseeds. Aim to eat fish at least two times a week.  Limit saturated fats. Saturated fats are primarily found in animal products, such as meats, butter, and cream. Plant sources of saturated fats include palm oil, palm kernel oil, and coconut oil.  Avoid foods with partially hydrogenated oils in them. These contain trans fats. Examples of foods that contain trans fats are stick margarine, some tub margarines, cookies, crackers, and other baked goods. What general guidelines do I need to follow? These guidelines for healthy eating will help you control your intake of fat and cholesterol:  Check food labels carefully to identify foods with trans fats or high amounts of saturated fat.  Fill one half of your plate with vegetables and green salads.  Fill one fourth of your plate with whole grains. Look for the word "whole" as the first word in the ingredient list.  Fill one fourth of your plate with lean protein foods.  Limit fruit to two servings a day. Choose fruit instead of juice.  Eat more foods that contain fiber, such as apples, broccoli, carrots, beans, peas, and barley.  Eat more home-cooked food and less restaurant, buffet, and fast food.  Limit or avoid alcohol.  Limit foods high in starch and sugar.  Limit fried foods.  Cook foods using methods other than frying. Baking, boiling, grilling, and broiling are all great options.  Lose weight if you are overweight. Losing just 5-10% of your initial body weight can help your overall health and prevent diseases such as diabetes and heart disease. What foods can I eat? Grains  Whole grains, such as whole wheat or whole grain breads, crackers, cereals, and pasta. Unsweetened oatmeal, bulgur, barley, quinoa, or brown rice. Corn or whole wheat flour tortillas. Vegetables  Fresh or frozen vegetables (raw, steamed, roasted, or grilled). Green  salads. Fruits  All fresh, canned (in natural juice), or frozen fruits. Meats and other protein foods  Ground beef (85% or leaner), grass-fed beef, or beef trimmed of fat. Skinless chicken or [place]. Ground chicken or [place]. Pork trimmed of fat. All fish and seafood. Eggs. Dried beans, peas, or lentils. Unsalted nuts or seeds. Unsalted canned or dry beans. Dairy  Low-fat dairy products, such as skim or 1% milk, 2% or reduced-fat cheeses, low-fat ricotta or cottage cheese, or plain low-fat yo Fats and oils  Tub margarines without trans fats. Light or reduced-fat mayonnaise and salad dressings. Avocado. Olive, canola, sesame, or safflower oils. Natural peanut or almond butter (choose ones without added sugar and oil). The items listed above may not be a complete list of recommended foods or beverages. Contact your dietitian for more options.  Foods to avoid Grains  White bread. White pasta. White rice. Cornbread. Bagels, pastries, and croissants. Crackers that contain trans fat. Vegetables  White potatoes. Corn. Creamed or fried vegetables. Vegetables in a cheese sauce. Fruits  Dried fruits. Canned fruit in light or heavy syrup. Fruit juice. Meats and  other protein foods  Fatty cuts of meat. Ribs, chicken wings, bacon, sausage, bologna, salami, chitterlings, fatback, hot dogs, bratwurst, and packaged luncheon meats. Liver and organ meats. Dairy  Whole or 2% milk, cream, half-and-half, and cream cheese. Whole milk cheeses. Whole-fat or sweetened yogurt. Full-fat cheeses. Nondairy creamers and whipped toppings. Processed cheese, cheese spreads, or cheese curds. Beverages  Alcohol. Sweetened drinks (such as sodas, lemonade, and fruit drinks or punches). Fats and oils  Butter, stick margarine, lard, shortening, ghee, or bacon fat. Coconut, palm kernel, or palm oils. Sweets and desserts  Corn syrup, sugars, honey, and molasses. Candy. Jam and jelly. Syrup. Sweetened cereals. Cookies,  pies, cakes, donuts, muffins, and ice cream. The items listed above may not be a complete list of foods and beverages to avoid. Contact your dietitian for more information.  This information is not intended to replace advice given to you by your health care provider. Make sure you discuss any questions you have with your health care provider. Document Released: 11/06/2005 Document Revised: 11/27/2014 Document Reviewed: 02/04/2014 Elsevier Interactive Patient Education  2017 [place] for Type 2 Diabetes A screening test for type 2 diabetes (type 2 diabetes mellitus) is a blood test to measure your blood sugar (glucose) level. This test is done to check for early signs of diabetes, before you develop symptoms. Type 2 diabetes is a long-term (chronic) disease that occurs when the pancreas does not make enough of a hormone called insulin. This results in high blood glucose levels, which can cause many complications. You may be screened for type 2 diabetes as part of your regular health care, especially if you have a high risk for diabetes. Screening can help identify type 2 diabetes at its early stage (prediabetes). Identifying and treating prediabetes may delay or prevent development of type 2 diabetes. What are the risk factors for type 2 diabetes? The following factors may make you more likely to develop type 2 diabetes:  Having a parent or sibling (first-degree relative) who has diabetes.  Being overweight or obese.  Being of American-Indian, [place], Hispanic, Latino, Asian, or African-American descent.  Not getting enough exercise.  Being older than 87.  Having a history of diabetes during pregnancy (gestational diabetes).  Having low levels of good cholesterol (HDL-C) or high levels of blood fats (triglycerides).  Having high blood glucose in a previous blood test.  Having high blood pressure.  Having certain diseases or conditions,  including:  Acanthosis nigricans. This is a condition that causes dark skin on the neck, armpits, and groin.  Polycystic ovary syndrome (PCOS).  Heart disease.  Having delivered a baby who weighed more than 9 lb (4.1 kg). Who should be screened for type 2 diabetes? Adults  Adults age 5 and older. These adults should be screened at least once every three years.  Adults who are younger than 13, overweight, and have at least one other risk factor. These adults should be screened at least once every three years.  Adults who have normal blood glucose levels and two or more risk factors. These adults may be screened once every year (annually).  Women who have had gestational diabetes in the past. These women should be screened at least once every three years.  Pregnant women who have risk factors. These women should be screened at their first prenatal visit.  Pregnant women with no risk factors. These women should be screened between weeks 24 and 28 of pregnancy. Children and adolescents  Children and  adolescents should be screened for type 2 diabetes if they are overweight and have 2 of the following risk factors:  A family history of type 2 diabetes.  Being a member of a high risk race or ethnic group.  Signs of insulin resistance or conditions associated with insulin resistance.  A mother who had gestational diabetes while pregnant with him or her.  Screening should be done at least once every three years, starting at age 67. Your health care provider or your child's health care provider may recommend having a screening more or less often. What happens during screening? During screening, your health care provider may ask questions about:  Your health and your risk factors, including your activity level and any medical conditions that you have.  The health of your first-degree relatives.  Past pregnancies, if this applies. Your health care provider will also do a physical  exam, including a blood pressure measurement and blood tests. There are four blood tests that can be used to screen for type 2 diabetes. You may have one or more of the following:  A fasting plasma glucose test (FBG). You will not be allowed to eat for at least eight hours before a blood sample is taken.  A random blood glucose test. This test checks your blood glucose at any time of the day regardless of when you ate.  An oral glucose tolerance test (OGTT). This test measures your blood glucose at two times:  After you have not eaten (have fasted) overnight.  Two hours after you drink a glucose-containing beverage. A diagnosis can be made if the level is greater than 200 mg/dL (11.1 mmol/L).  An A1c test. This test provides information about blood glucose control over the previous three months. What do the results mean? Your test results are a measurement of how much glucose is in your blood. Normal blood glucose levels mean that you do not have diabetes or prediabetes. High blood glucose levels may mean that you have prediabetes or diabetes. Depending on the results, other tests may be needed to confirm the diagnosis. This information is not intended to replace advice given to you by your health care provider. Make sure you discuss any questions you have with your health care provider. Document Released: 09/02/2009 Document Revised: 04/13/2016 Document Reviewed: 09/03/2015 Elsevier Interactive Patient Education  2017 [place].   Diabetes and Foot Care Diabetes may cause you to have problems because of poor blood supply (circulation) to your feet and legs. This may cause the skin on your feet to become thinner, break easier, and heal more slowly. Your skin may become dry, and the skin may peel and crack. You may also have nerve damage in your legs and feet causing decreased feeling in them. You may not notice minor injuries to your feet that could lead to infections or more serious  problems. Taking care of your feet is one of the most important things you can do for yourself. Follow these instructions at home:  Wear shoes at all times, even in the house. Do not go barefoot. Bare feet are easily injured.  Check your feet daily for blisters, cuts, and redness. If you cannot see the bottom of your feet, use a mirror or ask someone for help.  Wash your feet with warm water (do not use hot water) and mild soap. Then pat your feet and the areas between your toes until they are completely dry. Do not soak your feet as this can dry your skin.  Apply a moisturizing lotion or petroleum jelly (that does not contain alcohol and is unscented) to the skin on your feet and to dry, brittle toenails. Do not apply lotion between your toes.  Trim your toenails straight across. Do not dig under them or around the cuticle. File the edges of your nails with an emery board or nail file.  Do not cut corns or calluses or try to remove them with medicine.  Wear clean socks or stockings every day. Make sure they are not too tight. Do not wear knee-high stockings since they may decrease blood flow to your legs.  Wear shoes that fit properly and have enough cushioning. To break in new shoes, wear them for just a few hours a day. This prevents you from injuring your feet. Always look in your shoes before you put them on to be sure there are no objects inside.  Do not cross your legs. This may decrease the blood flow to your feet.  If you find a minor scrape, cut, or break in the skin on your feet, keep it and the skin around it clean and dry. These areas may be cleansed with mild soap and water. Do not cleanse the area with peroxide, alcohol, or iodine.  When you remove an adhesive bandage, be sure not to damage the skin around it.  If you have a wound, look at it several times a day to make sure it is healing.  Do not use heating pads or hot water bottles. They may burn your skin. If you have  lost feeling in your feet or legs, you may not know it is happening until it is too late.  Make sure your health care provider performs a complete foot exam at least annually or more often if you have foot problems. Report any cuts, sores, or bruises to your health care provider immediately. Contact a health care provider if:  You have an injury that is not healing.  You have cuts or breaks in the skin.  You have an ingrown nail.  You notice redness on your legs or feet.  You feel burning or tingling in your legs or feet.  You have pain or cramps in your legs and feet.  Your legs or feet are numb.  Your feet always feel cold. Get help right away if:  There is increasing redness, swelling, or pain in or around a wound.  There is a red line that goes up your leg.  Pus is coming from a wound.  You develop a fever or as directed by your health care provider.  You notice a bad smell coming from an ulcer or wound. This information is not intended to replace advice given to you by your health care provider. Make sure you discuss any questions you have with your health care provider. Document Released: 11/03/2000 Document Revised: 04/13/2016 Document Reviewed: 04/15/2013 Elsevier Interactive Patient Education  2017 [place] Maintenance, Male A healthy lifestyle and preventative care can promote health and wellness.  Maintain regular health, dental, and eye exams.  Eat a healthy diet. Foods like vegetables, fruits, whole grains, low-fat dairy products, and lean protein foods contain the nutrients you need and are low in calories. Decrease your intake of foods high in solid fats, added sugars, and salt. Get information about a proper diet from your health care provider, if necessary.  Regular physical exercise is one of the most important things you can do for your health. Most adults should  get at least 150 minutes of moderate-intensity exercise (any activity that  increases your heart rate and causes you to sweat) each week. In addition, most adults need muscle-strengthening exercises on 2 or more days a week.   Maintain a healthy weight. The body mass index (BMI) is a screening tool to identify possible weight problems. It provides an estimate of body fat based on height and weight. Your health care provider can find your BMI and can help you achieve or maintain a healthy weight. For males 20 years and older:  A BMI below 18.5 is considered underweight.  A BMI of 18.5 to 24.9 is normal.  A BMI of 25 to 29.9 is considered overweight.  A BMI of 30 and above is considered obese.  Maintain normal blood lipids and cholesterol by exercising and minimizing your intake of saturated fat. Eat a balanced diet with plenty of fruits and vegetables. Blood tests for lipids and cholesterol should begin at age 38 and be repeated every 5 years. If your lipid or cholesterol levels are high, you are over age 9, or you are at high risk for heart disease, you may need your cholesterol levels checked more frequently.Ongoing high lipid and cholesterol levels should be treated with medicines if diet and exercise are not working.  If you smoke, find out from your health care provider how to quit. If you do not use tobacco, do not start.  Lung cancer screening is recommended for adults aged 17-80 years who are at high risk for developing lung cancer because of a history of smoking. A yearly low-dose CT scan of the lungs is recommended for people who have at least a 30-pack-year history of smoking and are current smokers or have quit within the past 15 years. A pack year of smoking is smoking an average of 1 pack of cigarettes a day for 1 year (for example, a 30-pack-year history of smoking could mean smoking 1 pack a day for 30 years or 2 packs a day for 15 years). Yearly screening should continue until the smoker has stopped smoking for at least 15 years. Yearly screening should be  stopped for people who develop a health problem that would prevent them from having lung cancer treatment.  If you choose to drink alcohol, do not have more than 2 drinks per day. One drink is considered to be 12 oz (360 mL) of beer, 5 oz (150 mL) of wine, or 1.5 oz (45 mL) of liquor.  Avoid the use of street drugs. Do not share needles with anyone. Ask for help if you need support or instructions about stopping the use of drugs.  High blood pressure causes heart disease and increases the risk of stroke. High blood pressure is more likely to develop in:  People who have blood pressure in the end of the normal range (100-139/85-89 mm Hg).  People who are overweight or obese.  People who are African American.  If you are 85-8 years of age, have your blood pressure checked every 3-5 years. If you are 64 years of age or older, have your blood pressure checked every year. You should have your blood pressure measured twice-once when you are at a hospital or clinic, and once when you are not at a hospital or clinic. Record the average of the two measurements. To check your blood pressure when you are not at a hospital or clinic, you can use:  An automated blood pressure machine at a pharmacy.  A home blood  pressure monitor.  If you are 85-58 years old, ask your health care provider if you should take aspirin to prevent heart disease.  Diabetes screening involves taking a blood sample to check your fasting blood sugar level. This should be done once every 3 years after age 38 if you are at a normal weight and without risk factors for diabetes. Testing should be considered at a younger age or be carried out more frequently if you are overweight and have at least 1 risk factor for diabetes.  Colorectal cancer can be detected and often prevented. Most routine colorectal cancer screening begins at the age of 2 and continues through age 43. However, your health care provider may recommend screening at  an earlier age if you have risk factors for colon cancer. On a yearly basis, your health care provider may provide home test kits to check for hidden blood in the stool. A small camera at the end of a tube may be used to directly examine the colon (sigmoidoscopy or colonoscopy) to detect the earliest forms of colorectal cancer. Talk to your health care provider about this at age 35 when routine screening begins. A direct exam of the colon should be repeated every 5-10 years through age 45, unless early forms of precancerous polyps or small growths are found.  People who are at an increased risk for hepatitis B should be screened for this virus. You are considered at high risk for hepatitis B if:  You were born in a country where hepatitis B occurs often. Talk with your health care provider about which countries are considered high risk.  Your parents were born in a high-risk country and you have not received a shot to protect against hepatitis B (hepatitis B vaccine).  You have HIV or AIDS.  You use needles to inject street drugs.  You live with, or have sex with, someone who has hepatitis B.  You are a man who has sex with other men (MSM).  You get hemodialysis treatment.  You take certain medicines for conditions like cancer, organ transplantation, and autoimmune conditions.  Hepatitis C blood testing is recommended for all people born from [birth year] through 1966 and any individual with known risk factors for hepatitis C.  Healthy men should no longer receive prostate-specific antigen (PSA) blood tests as part of routine cancer screening. Talk to your health care provider about prostate cancer screening.  Testicular cancer screening is not recommended for adolescents or adult males who have no symptoms. Screening includes self-exam, a health care provider exam, and other screening tests. Consult with your health care provider about any symptoms you have or any concerns you have about testicular  cancer.  Practice safe sex. Use condoms and avoid high-risk sexual practices to reduce the spread of sexually transmitted infections (STIs).  You should be screened for STIs, including gonorrhea and chlamydia if:  You are sexually active and are younger than 24 years.  You are older than 24 years, and your health care provider tells you that you are at risk for this type of infection.  Your sexual activity has changed since you were last screened, and you are at an increased risk for chlamydia or gonorrhea. Ask your health care provider if you are at risk.  If you are at risk of being infected with HIV, it is recommended that you take a prescription medicine daily to prevent HIV infection. This is called pre-exposure prophylaxis (PrEP). You are considered at risk if:  You are a  man who has sex with other men (MSM).  You are a heterosexual man who is sexually active with multiple partners.  You take drugs by injection.  You are sexually active with a partner who has HIV.  Talk with your health care provider about whether you are at high risk of being infected with HIV. If you choose to begin PrEP, you should first be tested for HIV. You should then be tested every 3 months for as long as you are taking PrEP.  Use sunscreen. Apply sunscreen liberally and repeatedly throughout the day. You should seek shade when your shadow is shorter than you. Protect yourself by wearing long sleeves, pants, a wide-brimmed hat, and sunglasses year round whenever you are outdoors.  Tell your health care provider of new moles or changes in moles, especially if there is a change in shape or color. Also, tell your health care provider if a mole is larger than the size of a pencil eraser.  A one-time screening for abdominal aortic aneurysm (AAA) and surgical repair of large AAAs by ultrasound is recommended for men aged 14-75 years who are current or former smokers.  Stay current with your vaccines  (immunizations). This information is not intended to replace advice given to you by your health care provider. Make sure you discuss any questions you have with your health care provider. Document Released: 05/04/2008 Document Revised: 11/27/2014 Document Reviewed: 08/10/2015 Elsevier Interactive Patient Education  2017 [employer].

## 2016-12-12 NOTE — Progress Notes (Signed)
Subjective:    CC: gout  HPI: Gout- Here to follow-up on gout. He is currently taking allopurinol 300 mg twice a day. He does have some renal impairment and his neurologist is aware of his current dosing. A prescription for colchicine that he uses as needed. He is on Lasix which certainly could exacerbate his symptoms. This is felt to be necessary because of his heart failure.  He actually has not had any flares in about 4-5 months he's been very happy about this.  Constipation-he was able to use the MiraLAX and get some relief but now he feels like he is starting to get more constipated and has not had a bowel movement in 3 days. He is on Lasix daily.  Cataracts-he says that they are severe enough that he actually has not been driving for the last 2-3 months and is actually planning on having surgery in March.  Past medical history, Surgical history, Family history not pertinant except as noted below, Social history, Allergies, and medications have been entered into the medical record, reviewed, and corrections made.    Objective:    General: Well Developed, well nourished, and in no acute distress.  Neuro: Alert and oriented x3, extra-ocular muscles intact, sensation grossly intact.  HEENT: Normocephalic, atraumatic  Skin: Warm and dry, no rashes. Cardiac: Regular rate and rhythm, no murmurs rubs or gallops, no lower extremity edema.  Respiratory: Clear to auscultation bilaterally. Not using accessory muscles, speaking in full sentences.   Impression and Recommendations:    Gout - Stable. Doing well. No flares in 4-5 months. Refilled medication for 1 year today. Next  Constipation, chronic-discussed getting back on the MiraLAX regularly to help keep his bowels moving smoothly. Normally we could consider decreasing the Lasix but his nephrologist actually wants him on this dose. In fact his nephrologist wants him on it 3 times a day instead of just twice a day.  Cataracts-he is  scheduled for surgery in March for both eyes. Will get eye exam are entered into the chart.  Declined flu vaccine today but is willing to go ahead and update his Pneumovax 23 today.

## 2016-12-14 ENCOUNTER — Telehealth: Payer: Self-pay | Admitting: Family Medicine

## 2016-12-14 DIAGNOSIS — E669 Obesity, unspecified: Secondary | ICD-10-CM | POA: Diagnosis not present

## 2016-12-14 DIAGNOSIS — M109 Gout, unspecified: Secondary | ICD-10-CM | POA: Diagnosis not present

## 2016-12-14 DIAGNOSIS — I129 Hypertensive chronic kidney disease with stage 1 through stage 4 chronic kidney disease, or unspecified chronic kidney disease: Secondary | ICD-10-CM | POA: Diagnosis not present

## 2016-12-14 DIAGNOSIS — N184 Chronic kidney disease, stage 4 (severe): Secondary | ICD-10-CM | POA: Diagnosis not present

## 2016-12-14 DIAGNOSIS — D631 Anemia in chronic kidney disease: Secondary | ICD-10-CM | POA: Diagnosis not present

## 2016-12-14 DIAGNOSIS — R809 Proteinuria, unspecified: Secondary | ICD-10-CM | POA: Diagnosis not present

## 2016-12-14 DIAGNOSIS — E0822 Diabetes mellitus due to underlying condition with diabetic chronic kidney disease: Secondary | ICD-10-CM | POA: Diagnosis not present

## 2016-12-14 DIAGNOSIS — N2581 Secondary hyperparathyroidism of renal origin: Secondary | ICD-10-CM | POA: Diagnosis not present

## 2016-12-14 NOTE — Telephone Encounter (Signed)
Please call pt and let him know Dr. Mercy Moore sound we might be able ot reduce his allopurinol. He wants [place] to recheck uric acid and if still looks really good then we can decrease dose.

## 2016-12-14 NOTE — Telephone Encounter (Signed)
-----   Message from Fleet Contras, MD sent at 12/13/2016  9:43 AM EST ----- The last uric acid was 6 months so I would recheck and if still that low then decrease the dose ----- Message ----- From: [name], MD Sent: 12/12/2016  11:29 AM To: Fleet Contras, MD  Hi Dr. Mercy Moore,   Mr Antony' uric acid is amazing! Could we consider decreasing his allopurinol to one a day instead of 2 a day?  He has cut out all beef which I think has made a big difference.    Thank you,  [name] MD

## 2016-12-15 NOTE — Telephone Encounter (Signed)
Called and spoke with Pt, Dr. Mercy Moore drew new uric acid levels yesterday. His office should fax results. Based on labs, PCP will decide if Rx changes are to be made.

## 2016-12-15 NOTE — Progress Notes (Signed)
Note above reviewed personally by me. Will make sure we get up-to-date eye exam and work on getting hepatitis screening at next blood draw.  [name], MD

## 2016-12-20 LAB — URIC ACID: Uric Acid, Serum: 5.9

## 2016-12-21 DIAGNOSIS — M79671 Pain in right foot: Secondary | ICD-10-CM | POA: Diagnosis not present

## 2016-12-25 ENCOUNTER — Encounter (INDEPENDENT_AMBULATORY_CARE_PROVIDER_SITE_OTHER): Payer: Self-pay | Admitting: Orthopedic Surgery

## 2016-12-25 ENCOUNTER — Encounter: Payer: Self-pay | Admitting: Family Medicine

## 2016-12-25 ENCOUNTER — Ambulatory Visit (INDEPENDENT_AMBULATORY_CARE_PROVIDER_SITE_OTHER): Payer: Medicare Other | Admitting: Orthopedic Surgery

## 2016-12-25 VITALS — Ht 68.0 in | Wt 232.0 lb

## 2016-12-25 DIAGNOSIS — M1A09X0 Idiopathic chronic gout, multiple sites, without tophus (tophi): Secondary | ICD-10-CM

## 2016-12-25 DIAGNOSIS — E1142 Type 2 diabetes mellitus with diabetic polyneuropathy: Secondary | ICD-10-CM | POA: Diagnosis not present

## 2016-12-25 DIAGNOSIS — I87323 Chronic venous hypertension (idiopathic) with inflammation of bilateral lower extremity: Secondary | ICD-10-CM | POA: Diagnosis not present

## 2016-12-25 NOTE — Progress Notes (Signed)
Office Visit Note   Patient: Louis [name]           Date of Birth: October 22, 1946           MRN: [id number] Visit Date: 12/25/2016              Requested by: [name], [address] [place], [place] [address] PCP: [name], MD  Chief Complaint  Patient presents with  . Right Ankle - Pain    HPI: Patient is here today for right ankle and foot pain. Pt states that last year he had stubbed his foot on his bed and "foot was broken" he was placed in a fracture boot which he is still wearing now. He is non weight bearing today in a wheelchair. The pt has several disc with x rays of the foot and ankle one from a year ago and one from last week. Patient has pain with weight bearing and complains of sharp shooting pain that comes through the medical side of his ankle  Does complain of swelling and the foot and ankle are quite puffy though he is wearing a single layer compression sock. Autumn L Forrest, RMA  Patient does have a history of type 2 diabetes currently taking insulin and 50 units twice a day as well as a sliding scale insulin. His most recent hemoglobin A1c is 6.2 patient also takes 600 mg of allopurinol a day for his gout. Most recent uric acid 2 weeks ago was 5.9.  Assessment & Plan: Visit Diagnoses:  1. Diabetic polyneuropathy associated with type 2 diabetes mellitus ([place])   2. Idiopathic chronic gout of multiple sites without tophus   3. 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  #5 Charcot collapse midfoot  Plan: We'll obtain a CT scan of the right foot. Follow-up after the CT is obtained. Patient may require internal fixation of the Charcot collapse or internal fixation of the nonunion base of the fifth metatarsal.  Follow-Up Instructions: Return in about 2 weeks (around 01/08/2017).   Ortho Exam Examination patient is alert oriented The  well-dressed normal affect normal respiratory effort is significant venous stasis swelling in the right foot and leg. He has a faintly palpable pulse he has good capillary refill his foot is warm. He is tender to palpation across the midfoot. The ankle is also tender to palpation. Patient is tender to palpation of the base of the fifth metatarsal. Radiographs reviewed which shows destructive changes through the midfoot as well as a nonunion base of fifth metatarsal. There is periarticular cystic changes to the midfoot.  Imaging: No results found.  Orders:  Orders Placed This Encounter  Procedures  . CT FOOT RIGHT WO CONTRAST   No orders of the defined types were placed in this encounter.    Procedures: No procedures performed  Clinical Data: No additional findings.  Subjective: Review of Systems  Objective: Vital Signs: Ht 5\' 8"  (1.727 m)   Wt 232 lb (105.2 kg)   BMI 35.28 kg/m   Specialty Comments:  No specialty comments available.  PMFS History: Patient Active Problem List   Diagnosis Date Noted  . Diabetic polyneuropathy associated with type 2 diabetes mellitus ([place]) 12/25/2016  . Idiopathic chronic venous hypertension of both lower extremities with inflammation 12/25/2016  . Cataract, right 05/18/2016  . Right cervical radiculopathy 05/12/2016  . Mild nonproliferative diabetic retinopathy with macular edema associated  with type 2 diabetes mellitus ([place]) 10/12/2015  . 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 Gout 04/20/2015  . Osteoarthritis of both acromioclavicular joints 12/03/2014  . Left shoulder pain 11/17/2014  . Unstable angina pectoris ([place]) 10/05/2014  . Acute diastolic heart failure ([place]) 10/05/2014  . Personal history of colonic polyps - adenomas 01/28/2014  . Bilateral carpal tunnel syndrome 01/09/2014  . Diabetic retinopathy ([place]) 10/31/2013  . Macular edema, diabetic ([place]) 10/31/2013  . Obesity (BMI 30-39.9) 07/31/2013  .  Fatigue 03/06/2012  . Astigmatism 01/11/2012  . CAD (coronary artery disease), native coronary artery 05/17/2011  . Nonspecific abnormal unspecified cardiovascular function study 03/01/2011  . Bradycardia 02/08/2011  . Right knee pain 02/08/2011  . Hyperlipidemia   . RBBB 02/01/2011  . Chronic kidney disease, stage 4, severely decreased GFR (HCC) 10/23/2006  . Proteinuria 10/23/2006  . Obstructive sleep apnea 10/05/2006  . Diabetes mellitus with stage 4 chronic kidney disease ([place]) 08/28/2006  . HYPERTENSION, BENIGN SYSTEMIC 08/28/2006   Past Medical History:  Diagnosis Date  . CAD (coronary artery disease)   . Diabetes mellitus   . Heart failure, diastolic ([place])   . Hyperlipidemia   . Hypertension   . Macular edema 2014  . OSA on CPAP   . Personal history of colonic polyps - adenomas 01/28/2014  . Renal insufficiency   . Shortness of breath dyspnea     Family History  Problem Relation Age of Onset  . Diabetes Mother   . Kidney disease Mother   . Diabetes Father   . Coronary artery disease Father   . Diabetes Brother   . Diabetes Sister   . Cancer Maternal Uncle   . Diabetes Maternal Grandmother   . Cancer Paternal Grandmother   . Heart attack Paternal Grandfather   . Colon cancer Neg Hx     Past Surgical History:  Procedure Laterality Date  . CARPAL TUNNEL RELEASE    . KNEE ARTHROSCOPY Right 09/13/2016   [facility], Dr. Dorna Leitz  . PILONIDAL CYST EXCISION     Social History   Occupational History  . Warehouse    Social History Main Topics  . Smoking status: Former Smoker    Packs/day: 0.50    Years: 20.00    Types: Cigarettes    Quit date: 03/06/1977  . Smokeless tobacco: Never Used  . Alcohol use No  . Drug use: No  . Sexual activity: No

## 2016-12-26 ENCOUNTER — Telehealth (INDEPENDENT_AMBULATORY_CARE_PROVIDER_SITE_OTHER): Payer: Self-pay | Admitting: Orthopedic Surgery

## 2016-12-26 NOTE — Telephone Encounter (Signed)
PATIENTS WIFE CALLED CONCERNING HER HUSBAND ABOUT A CT SCAN, WHEN HE HAS KIDNEY DISEASE. IF ITS SAFE FOR HIM TO HAVE THE CT SCAN? ALSO CAN HE BEAR ANY WEIGHT ON HIS FOOT. CB # [id number]

## 2016-12-26 NOTE — Telephone Encounter (Signed)
Yes he is okay for a CT scan no contraindications with kidney disease

## 2016-12-26 NOTE — Telephone Encounter (Signed)
You saw this pt yesterday

## 2016-12-27 NOTE — Telephone Encounter (Signed)
I called and left voicemail to advise he is ok to have CT scan, and non weightbearing or weightbear through heel for transfers, minimizing weight as much as possible.

## 2016-12-28 ENCOUNTER — Telehealth (INDEPENDENT_AMBULATORY_CARE_PROVIDER_SITE_OTHER): Payer: Self-pay | Admitting: *Deleted

## 2016-12-28 DIAGNOSIS — Z794 Long term (current) use of insulin: Secondary | ICD-10-CM | POA: Diagnosis not present

## 2016-12-28 DIAGNOSIS — E113213 Type 2 diabetes mellitus with mild nonproliferative diabetic retinopathy with macular edema, bilateral: Secondary | ICD-10-CM | POA: Diagnosis not present

## 2016-12-28 NOTE — Telephone Encounter (Signed)
Call patient we are doing a CT scan without contrast.

## 2016-12-28 NOTE — Telephone Encounter (Signed)
Pt wife came in stating she was soncerned about the contrast for the CT. That he cannot have the contrast because of his kidney disease. Pt wife asking if an MRI would be better.

## 2016-12-28 NOTE — Telephone Encounter (Signed)
I called left voicemail, CT scan was ordered without contrast and should have no problems with kidneys.

## 2017-01-02 ENCOUNTER — Encounter: Payer: Self-pay | Admitting: Family Medicine

## 2017-01-03 ENCOUNTER — Ambulatory Visit (INDEPENDENT_AMBULATORY_CARE_PROVIDER_SITE_OTHER): Payer: Medicare Other

## 2017-01-03 DIAGNOSIS — I87323 Chronic venous hypertension (idiopathic) with inflammation of bilateral lower extremity: Secondary | ICD-10-CM

## 2017-01-03 DIAGNOSIS — M1A09X0 Idiopathic chronic gout, multiple sites, without tophus (tophi): Secondary | ICD-10-CM

## 2017-01-03 DIAGNOSIS — A5216 Charcot's arthropathy (tabetic): Secondary | ICD-10-CM

## 2017-01-03 DIAGNOSIS — M7989 Other specified soft tissue disorders: Secondary | ICD-10-CM | POA: Diagnosis not present

## 2017-01-03 DIAGNOSIS — E1142 Type 2 diabetes mellitus with diabetic polyneuropathy: Secondary | ICD-10-CM

## 2017-01-08 ENCOUNTER — Ambulatory Visit (INDEPENDENT_AMBULATORY_CARE_PROVIDER_SITE_OTHER): Payer: Medicare Other | Admitting: Orthopedic Surgery

## 2017-01-08 ENCOUNTER — Encounter (INDEPENDENT_AMBULATORY_CARE_PROVIDER_SITE_OTHER): Payer: Self-pay | Admitting: Orthopedic Surgery

## 2017-01-08 VITALS — Ht 68.0 in | Wt 232.0 lb

## 2017-01-08 DIAGNOSIS — E1142 Type 2 diabetes mellitus with diabetic polyneuropathy: Secondary | ICD-10-CM

## 2017-01-08 DIAGNOSIS — E11610 Type 2 diabetes mellitus with diabetic neuropathic arthropathy: Secondary | ICD-10-CM | POA: Diagnosis not present

## 2017-01-08 NOTE — Progress Notes (Signed)
Office Visit Note   Patient: Louis [name]           Date of Birth: 01-Dec-1945           MRN: [id number] Visit Date: 01/08/2017              Requested by: [name], MD [place] [address] [place], [place] [address] PCP: [name], MD  Chief Complaint  Patient presents with  . Right Foot - Follow-up     Patient may require internal fixation of the Charcot collapse or internal fixation of the nonunion base of the fifth metatarsal.    HPI: Review  CT scan of right foot.   Patient states that his foot is feeling better with using the fracture boot and he states the swelling has been decreasing with elevation and wearing the medical compression stocking.  Patient states his father had a transtibial amputation due to his diabetes.  Assessment & Plan: Visit Diagnoses:  1. Diabetic polyneuropathy associated with type 2 diabetes mellitus ([place])   2. 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 Discussed risks of internal fixation and since he is getting better we will hold on surgical intervention. Patient will go to the medical supply store [facility] to obtain extra depth shoes and multi-density inserts. Prescription was provided.  Plan for repeat 3 view radiographs of the right foot at follow-up.  Follow-Up Instructions: Return in about 4 weeks (around 02/05/2017).   Ortho Exam Examination patient is alert oriented no adenopathy well-dressed normal affect normal respiratory effort he does have an antalgic gait he has a palpable dorsalis pedis pulse his foot is warm there is no redness no cellulitis. There is no tenderness to palpation his swelling has decreased. Review of the CT scan shows advance degenerative Charcot collapse throughout the [place] region no evidence of a fracture of the base of the fifth metatarsal.  Imaging: No results found.  Orders:  No orders of the defined types  were placed in this encounter.  No orders of the defined types were placed in this encounter.    Procedures: No procedures performed  Clinical Data: No additional findings.  Subjective: Review of Systems  Objective: Vital Signs: Ht 5\' 8"  (1.727 m)   Wt 232 lb (105.2 kg)   BMI 35.28 kg/m   Specialty Comments:  No specialty comments available.  PMFS History: Patient Active Problem List   Diagnosis Date Noted  . 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 Diabetic polyneuropathy associated with type 2 diabetes mellitus ([place]) 12/25/2016  . Idiopathic chronic venous hypertension of both lower extremities with inflammation 12/25/2016  . Cataract, right 05/18/2016  . Right cervical radiculopathy 05/12/2016  . 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 ([place]) 10/12/2015  . 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 Gout 04/20/2015  . Osteoarthritis of both acromioclavicular joints 12/03/2014  . Left shoulder pain 11/17/2014  . Unstable angina pectoris ([place]) 10/05/2014  . Acute diastolic heart failure ([place]) 10/05/2014  . Personal history of colonic polyps - adenomas 01/28/2014  . Bilateral carpal tunnel syndrome 01/09/2014  . Diabetic retinopathy ([place]) 10/31/2013  . Macular edema, diabetic ([place]) 10/31/2013  . Obesity (BMI 30-39.9) 07/31/2013  . Fatigue 03/06/2012  . Astigmatism 01/11/2012  . CAD (coronary artery disease), native coronary artery 05/17/2011  . Nonspecific abnormal unspecified  cardiovascular function study 03/01/2011  . Bradycardia 02/08/2011  . Right knee pain 02/08/2011  . Hyperlipidemia   . RBBB 02/01/2011  . Chronic kidney disease, stage 4, severely decreased GFR (HCC) 10/23/2006  . Proteinuria 10/23/2006  . Obstructive sleep apnea 10/05/2006  . Diabetes mellitus with stage 4 chronic kidney disease ([place]) 08/28/2006  . HYPERTENSION, BENIGN  SYSTEMIC 08/28/2006   Past Medical History:  Diagnosis Date  . CAD (coronary artery disease)   . Diabetes mellitus   . Heart failure, diastolic ([place])   . Hyperlipidemia   . Hypertension   . Macular edema 2014  . OSA on CPAP   . Personal history of colonic polyps - adenomas 01/28/2014  . Renal insufficiency   . Shortness of breath dyspnea     Family History  Problem Relation Age of Onset  . Diabetes Mother   . Kidney disease Mother   . Diabetes Father   . Coronary artery disease Father   . Diabetes Brother   . Diabetes Sister   . Cancer Maternal Uncle   . Diabetes Maternal Grandmother   . Cancer Paternal Grandmother   . Heart attack Paternal Grandfather   . Colon cancer Neg Hx     Past Surgical History:  Procedure Laterality Date  . CARPAL TUNNEL RELEASE    . KNEE ARTHROSCOPY Right 09/13/2016   [facility], Dr. Dorna Leitz  . PILONIDAL CYST EXCISION     Social History   Occupational History  . Warehouse    Social History Main Topics  . Smoking status: Former Smoker    Packs/day: 0.50    Years: 20.00    Types: Cigarettes    Quit date: 03/06/1977  . Smokeless tobacco: Never Used  . Alcohol use No  . Drug use: No  . Sexual activity: No

## 2017-01-09 ENCOUNTER — Ambulatory Visit (AMBULATORY_SURGERY_CENTER): Payer: Self-pay

## 2017-01-09 VITALS — Ht 68.0 in | Wt 236.2 lb

## 2017-01-09 DIAGNOSIS — Z86010 Personal history of colonic polyps: Secondary | ICD-10-CM

## 2017-01-09 NOTE — Progress Notes (Signed)
Per pt, no allergies to soy or egg products.Pt not taking any weight loss meds or using  O2 at home. 

## 2017-01-11 DIAGNOSIS — Z87891 Personal history of nicotine dependence: Secondary | ICD-10-CM | POA: Diagnosis not present

## 2017-01-11 DIAGNOSIS — H2511 Age-related nuclear cataract, right eye: Secondary | ICD-10-CM | POA: Diagnosis not present

## 2017-01-11 DIAGNOSIS — Z885 Allergy status to narcotic agent status: Secondary | ICD-10-CM | POA: Diagnosis not present

## 2017-01-11 DIAGNOSIS — E1136 Type 2 diabetes mellitus with diabetic cataract: Secondary | ICD-10-CM | POA: Diagnosis not present

## 2017-01-11 DIAGNOSIS — I2510 Atherosclerotic heart disease of native coronary artery without angina pectoris: Secondary | ICD-10-CM | POA: Diagnosis not present

## 2017-01-11 DIAGNOSIS — I10 Essential (primary) hypertension: Secondary | ICD-10-CM | POA: Diagnosis not present

## 2017-01-11 DIAGNOSIS — I129 Hypertensive chronic kidney disease with stage 1 through stage 4 chronic kidney disease, or unspecified chronic kidney disease: Secondary | ICD-10-CM | POA: Diagnosis not present

## 2017-01-11 DIAGNOSIS — Z794 Long term (current) use of insulin: Secondary | ICD-10-CM | POA: Diagnosis not present

## 2017-01-11 DIAGNOSIS — E113213 Type 2 diabetes mellitus with mild nonproliferative diabetic retinopathy with macular edema, bilateral: Secondary | ICD-10-CM | POA: Diagnosis not present

## 2017-01-11 DIAGNOSIS — N184 Chronic kidney disease, stage 4 (severe): Secondary | ICD-10-CM | POA: Diagnosis not present

## 2017-01-11 DIAGNOSIS — Z79899 Other long term (current) drug therapy: Secondary | ICD-10-CM | POA: Diagnosis not present

## 2017-01-11 DIAGNOSIS — H2513 Age-related nuclear cataract, bilateral: Secondary | ICD-10-CM | POA: Diagnosis not present

## 2017-01-11 DIAGNOSIS — Z7982 Long term (current) use of aspirin: Secondary | ICD-10-CM | POA: Diagnosis not present

## 2017-01-11 DIAGNOSIS — E113211 Type 2 diabetes mellitus with mild nonproliferative diabetic retinopathy with macular edema, right eye: Secondary | ICD-10-CM | POA: Diagnosis not present

## 2017-01-18 DIAGNOSIS — Z87891 Personal history of nicotine dependence: Secondary | ICD-10-CM | POA: Diagnosis not present

## 2017-01-18 DIAGNOSIS — H25813 Combined forms of age-related cataract, bilateral: Secondary | ICD-10-CM | POA: Diagnosis not present

## 2017-01-18 DIAGNOSIS — E785 Hyperlipidemia, unspecified: Secondary | ICD-10-CM | POA: Diagnosis not present

## 2017-01-18 DIAGNOSIS — Z794 Long term (current) use of insulin: Secondary | ICD-10-CM | POA: Diagnosis not present

## 2017-01-18 DIAGNOSIS — H25811 Combined forms of age-related cataract, right eye: Secondary | ICD-10-CM | POA: Diagnosis not present

## 2017-01-18 DIAGNOSIS — I2510 Atherosclerotic heart disease of native coronary artery without angina pectoris: Secondary | ICD-10-CM | POA: Diagnosis not present

## 2017-01-18 DIAGNOSIS — I509 Heart failure, unspecified: Secondary | ICD-10-CM | POA: Diagnosis not present

## 2017-01-18 DIAGNOSIS — N184 Chronic kidney disease, stage 4 (severe): Secondary | ICD-10-CM | POA: Diagnosis not present

## 2017-01-18 DIAGNOSIS — G4733 Obstructive sleep apnea (adult) (pediatric): Secondary | ICD-10-CM | POA: Diagnosis not present

## 2017-01-18 DIAGNOSIS — I130 Hypertensive heart and chronic kidney disease with heart failure and stage 1 through stage 4 chronic kidney disease, or unspecified chronic kidney disease: Secondary | ICD-10-CM | POA: Diagnosis not present

## 2017-01-18 DIAGNOSIS — E1142 Type 2 diabetes mellitus with diabetic polyneuropathy: Secondary | ICD-10-CM | POA: Diagnosis not present

## 2017-01-22 ENCOUNTER — Other Ambulatory Visit: Payer: Self-pay

## 2017-01-22 MED ORDER — ISOSORBIDE MONONITRATE 20 MG PO TABS: 20.0000 mg | ORAL_TABLET | Freq: Two times a day (BID) | ORAL | 3 refills | Status: DC

## 2017-01-25 ENCOUNTER — Ambulatory Visit (AMBULATORY_SURGERY_CENTER): Payer: Medicare Other | Admitting: Internal Medicine

## 2017-01-25 ENCOUNTER — Encounter: Payer: Self-pay | Admitting: Internal Medicine

## 2017-01-25 VITALS — BP 115/61 | HR 49 | Temp 96.2°F | Resp 12 | Ht 68.0 in | Wt 236.0 lb

## 2017-01-25 DIAGNOSIS — D122 Benign neoplasm of ascending colon: Secondary | ICD-10-CM | POA: Diagnosis not present

## 2017-01-25 DIAGNOSIS — D128 Benign neoplasm of rectum: Secondary | ICD-10-CM | POA: Diagnosis not present

## 2017-01-25 DIAGNOSIS — D124 Benign neoplasm of descending colon: Secondary | ICD-10-CM

## 2017-01-25 DIAGNOSIS — I509 Heart failure, unspecified: Secondary | ICD-10-CM | POA: Diagnosis not present

## 2017-01-25 DIAGNOSIS — I10 Essential (primary) hypertension: Secondary | ICD-10-CM | POA: Diagnosis not present

## 2017-01-25 DIAGNOSIS — E119 Type 2 diabetes mellitus without complications: Secondary | ICD-10-CM | POA: Diagnosis not present

## 2017-01-25 DIAGNOSIS — D129 Benign neoplasm of anus and anal canal: Secondary | ICD-10-CM | POA: Diagnosis not present

## 2017-01-25 DIAGNOSIS — D123 Benign neoplasm of transverse colon: Secondary | ICD-10-CM | POA: Diagnosis not present

## 2017-01-25 DIAGNOSIS — G4733 Obstructive sleep apnea (adult) (pediatric): Secondary | ICD-10-CM | POA: Diagnosis not present

## 2017-01-25 DIAGNOSIS — Z86010 Personal history of colonic polyps: Secondary | ICD-10-CM

## 2017-01-25 MED ORDER — SODIUM CHLORIDE 0.9 % IV SOLN: 500.0000 mL | INTRAVENOUS | Status: DC

## 2017-01-25 NOTE — Progress Notes (Signed)
Report given to PACU, vss 

## 2017-01-25 NOTE — Progress Notes (Signed)
Patient reports no change in his health history except for cataract surgery on 01/18/17. SM Patient reports stool is cloudy and brown. Dr. Carlean Purl made aware and will proceed with procedure. SM

## 2017-01-25 NOTE — Patient Instructions (Addendum)
   I found and removed 3 polyps today - all look benign. I will let you know pathology results and when to have another routine colonoscopy by mail.  You also have a condition called diverticulosis - common and not usually a problem. Please read the handout provided.  I appreciate the opportunity to care for you. Gatha Mayer, MD, [facility]  Handouts given on Diverticulosis and Polyps  YOU HAD AN ENDOSCOPIC PROCEDURE TODAY: Refer to the procedure report and other information in the discharge instructions given to you for any specific questions about what was found during the examination. If this information does not answer your questions, please call [place] office at [phone] to clarify.   YOU SHOULD EXPECT: Some feelings of bloating in the abdomen. Passage of more gas than usual. Walking can help get rid of the air that was put into your GI tract during the procedure and reduce the bloating. If you had a lower endoscopy (such as a colonoscopy or flexible sigmoidoscopy) you may notice spotting of blood in your stool or on the toilet paper. Some abdominal soreness may be present for a day or two, also.  DIET: Your first meal following the procedure should be a light meal and then it is ok to progress to your normal diet. A half-sandwich or bowl of soup is an example of a good first meal. Heavy or fried foods are harder to digest and may make you feel nauseous or bloated. Drink plenty of fluids but you should avoid alcoholic beverages for 24 hours. If you had a esophageal dilation, please see attached instructions for diet.    ACTIVITY: Your care partner should take you home directly after the procedure. You should plan to take it easy, moving slowly for the rest of the day. You can resume normal activity the day after the procedure however YOU SHOULD NOT DRIVE, use power tools, machinery or perform tasks that involve climbing or major physical exertion for 24 hours (because of the sedation  medicines used during the test).   SYMPTOMS TO REPORT IMMEDIATELY: A gastroenterologist can be reached at any hour. Please call [phone]  for any of the following symptoms:  Following lower endoscopy (colonoscopy, flexible sigmoidoscopy) Excessive amounts of blood in the stool  Significant tenderness, worsening of abdominal pains  Swelling of the abdomen that is new, acute  Fever of 100 or higher    FOLLOW UP:  If any biopsies were taken you will be contacted by phone or by letter within the next 1-3 weeks. Call [phone]  if you have not heard about the biopsies in 3 weeks.  Please also call with any specific questions about appointments or follow up tests.

## 2017-01-25 NOTE — Progress Notes (Signed)
Called to room to assist during endoscopic procedure.  Patient ID and intended procedure confirmed with present staff. Received instructions for my participation in the procedure from the performing physician.  

## 2017-01-25 NOTE — Op Note (Signed)
[facility] Patient Name: Louis [name] Procedure Date: 01/25/2017 8:40 AM MRN: [id number] Endoscopist: Gatha Mayer , MD Age: 71 Referring MD:  Date of Birth: [birth year] Gender: Male Account #: [id number] Procedure:                Colonoscopy Indications:              Surveillance: Personal history of adenomatous                            polyps on last colonoscopy 3 years ago Medicines:                Propofol per Anesthesia, Monitored Anesthesia Care Procedure:                Pre-Anesthesia Assessment:                           - Prior to the procedure, a History and Physical                            was performed, and patient medications and                            allergies were reviewed. The patient's tolerance of                            previous anesthesia was also reviewed. The risks                            and benefits of the procedure and the sedation                            options and risks were discussed with the patient.                            All questions were answered, and informed consent                            was obtained. Prior Anticoagulants: The patient                            last took aspirin 1 day prior to the procedure. ASA                            Grade Assessment: III - A patient with severe                            systemic disease. After reviewing the risks and                            benefits, the patient was deemed in satisfactory                            condition to undergo the procedure.  After obtaining informed consent, the colonoscope                            was passed under direct vision. Throughout the                            procedure, the patient's blood pressure, pulse, and                            oxygen saturations were monitored continuously. The                            Colonoscope was introduced through the anus and                            advanced to  the the cecum, identified by                            appendiceal orifice and ileocecal valve. The                            colonoscopy was performed without difficulty. The                            patient tolerated the procedure well. The quality                            of the bowel preparation was good. The bowel                            preparation used was Miralax. The ileocecal valve,                            appendiceal orifice, and rectum were photographed. Scope In: 8:54:55 AM Scope Out: 9:11:54 AM Scope Withdrawal Time: 0 hours 13 minutes 9 seconds  Total Procedure Duration: 0 hours 16 minutes 59 seconds  Findings:                 The perianal and digital rectal examinations were                            normal. Pertinent negatives include normal prostate                            (size, shape, and consistency).                           Two sessile polyps were found in the descending                            colon, transverse colon and distal transverse                            colon. The polyps were 5 to 6 mm in size. These  polyps were removed with a cold snare. Resection                            and retrieval were complete. Verification of                            patient identification for the specimen was done.                            Estimated blood loss was minimal.                           A 10 mm polyp was found in the rectum. The polyp                            was sessile. The polyp was removed with a hot                            snare. Resection and retrieval were complete.                            Verification of patient identification for the                            specimen was done. Estimated blood loss: none.                           Multiple small and large-mouthed diverticula were                            found in the sigmoid colon.                           The exam was otherwise without  abnormality on                            direct and retroflexion views. Complications:            No immediate complications. Estimated Blood Loss:     Estimated blood loss was minimal. Impression:               - Two 5 to 6 mm polyps in the descending colon, in                            the transverse colon and in the distal transverse                            colon, removed with a cold snare. Resected and                            retrieved.                           - One 10 mm polyp in the rectum, removed with a hot  snare. Resected and retrieved.                           - Diverticulosis in the sigmoid colon.                           - The examination was otherwise normal on direct                            and retroflexion views. 3 POLYPS TOTAL MAX 10 MM                            SIZE                           - Personal history of colonic polyps. 5 ADENOMAS                            2015 Recommendation:           - Patient has a contact number available for                            emergencies. The signs and symptoms of potential                            delayed complications were discussed with the                            patient. Return to normal activities tomorrow.                            Written discharge instructions were provided to the                            patient.                           - Resume previous diet.                           - Continue present medications.                           - No aspirin, ibuprofen, naproxen, or other                            non-steroidal anti-inflammatory drugs for 2 weeks                            after polyp removal.                           - Repeat colonoscopy is recommended for                            surveillance. The colonoscopy date will be  determined after pathology results from today's                            exam become available for  review. Gatha Mayer, MD 01/25/2017 9:18:13 AM This report has been signed electronically.

## 2017-01-26 ENCOUNTER — Telehealth: Payer: Self-pay

## 2017-01-26 NOTE — Telephone Encounter (Signed)
  Follow up Call-  Call back number 01/25/2017  Post procedure Call Back phone  # [phone]  Permission to leave phone message Yes  Some recent data might be hidden     Patient questions:  Do you have a fever, pain , or abdominal swelling? No. Pain Score  0 *  Have you tolerated food without any problems? Yes.    Have you been able to return to your normal activities? Yes.    Do you have any questions about your discharge instructions: Diet   No. Medications  No. Follow up visit  No.  Do you have questions or concerns about your Care? No.  Actions: * If pain score is 4 or above: No action needed, pain <4.

## 2017-01-31 ENCOUNTER — Encounter: Payer: Self-pay | Admitting: Internal Medicine

## 2017-01-31 NOTE — Progress Notes (Signed)
3 adenomas max 10 mm Recall 2021 My Chart letter

## 2017-02-01 ENCOUNTER — Telehealth (INDEPENDENT_AMBULATORY_CARE_PROVIDER_SITE_OTHER): Payer: Self-pay | Admitting: Orthopedic Surgery

## 2017-02-01 NOTE — Telephone Encounter (Signed)
Called and lm on vm to advise that he needs to continue with his fracture boot. He can minimize his activities and use whichever OTC anti inflam that he would use normally and we can evaluate his hip at his appt on Monday. To call back with questions.

## 2017-02-01 NOTE — Telephone Encounter (Signed)
Pt wife called to say that pt is in a boot and pt hip is beginning to pain him from walking with the boot heavily, she wanted to see what you would advise about the pain.  [name]- [phone]

## 2017-02-02 ENCOUNTER — Telehealth: Payer: Self-pay

## 2017-02-02 ENCOUNTER — Emergency Department (HOSPITAL_COMMUNITY): Payer: Medicare Other

## 2017-02-02 ENCOUNTER — Observation Stay (HOSPITAL_COMMUNITY)
Admission: EM | Admit: 2017-02-02 | Discharge: 2017-02-03 | Disposition: A | Discharge status: Home / Self Care | Payer: Medicare Other | Attending: Internal Medicine | Admitting: Internal Medicine

## 2017-02-02 ENCOUNTER — Encounter (HOSPITAL_COMMUNITY): Payer: Self-pay

## 2017-02-02 DIAGNOSIS — E11319 Type 2 diabetes mellitus with unspecified diabetic retinopathy without macular edema: Secondary | ICD-10-CM | POA: Diagnosis not present

## 2017-02-02 DIAGNOSIS — Z87891 Personal history of nicotine dependence: Secondary | ICD-10-CM | POA: Insufficient documentation

## 2017-02-02 DIAGNOSIS — E1142 Type 2 diabetes mellitus with diabetic polyneuropathy: Secondary | ICD-10-CM | POA: Insufficient documentation

## 2017-02-02 DIAGNOSIS — Z79899 Other long term (current) drug therapy: Secondary | ICD-10-CM | POA: Diagnosis not present

## 2017-02-02 DIAGNOSIS — I517 Cardiomegaly: Secondary | ICD-10-CM | POA: Diagnosis not present

## 2017-02-02 DIAGNOSIS — R51 Headache: Secondary | ICD-10-CM | POA: Diagnosis not present

## 2017-02-02 DIAGNOSIS — R55 Syncope and collapse: Secondary | ICD-10-CM | POA: Diagnosis not present

## 2017-02-02 DIAGNOSIS — M109 Gout, unspecified: Secondary | ICD-10-CM | POA: Diagnosis present

## 2017-02-02 DIAGNOSIS — M1460 Charcot's joint, unspecified site: Secondary | ICD-10-CM | POA: Diagnosis not present

## 2017-02-02 DIAGNOSIS — E669 Obesity, unspecified: Secondary | ICD-10-CM | POA: Diagnosis present

## 2017-02-02 DIAGNOSIS — I5031 Acute diastolic (congestive) heart failure: Secondary | ICD-10-CM | POA: Insufficient documentation

## 2017-02-02 DIAGNOSIS — E11610 Type 2 diabetes mellitus with diabetic neuropathic arthropathy: Secondary | ICD-10-CM | POA: Diagnosis present

## 2017-02-02 DIAGNOSIS — R260 Ataxic gait: Secondary | ICD-10-CM | POA: Insufficient documentation

## 2017-02-02 DIAGNOSIS — I25119 Atherosclerotic heart disease of native coronary artery with unspecified angina pectoris: Secondary | ICD-10-CM | POA: Diagnosis present

## 2017-02-02 DIAGNOSIS — R42 Dizziness and giddiness: Secondary | ICD-10-CM | POA: Diagnosis not present

## 2017-02-02 DIAGNOSIS — I2510 Atherosclerotic heart disease of native coronary artery without angina pectoris: Secondary | ICD-10-CM | POA: Diagnosis not present

## 2017-02-02 DIAGNOSIS — N184 Chronic kidney disease, stage 4 (severe): Secondary | ICD-10-CM | POA: Insufficient documentation

## 2017-02-02 DIAGNOSIS — I130 Hypertensive heart and chronic kidney disease with heart failure and stage 1 through stage 4 chronic kidney disease, or unspecified chronic kidney disease: Secondary | ICD-10-CM | POA: Insufficient documentation

## 2017-02-02 DIAGNOSIS — Z7982 Long term (current) use of aspirin: Secondary | ICD-10-CM | POA: Diagnosis not present

## 2017-02-02 DIAGNOSIS — I509 Heart failure, unspecified: Secondary | ICD-10-CM

## 2017-02-02 DIAGNOSIS — I10 Essential (primary) hypertension: Secondary | ICD-10-CM | POA: Diagnosis present

## 2017-02-02 DIAGNOSIS — R9431 Abnormal electrocardiogram [ECG] [EKG]: Secondary | ICD-10-CM | POA: Diagnosis present

## 2017-02-02 DIAGNOSIS — Z794 Long term (current) use of insulin: Secondary | ICD-10-CM | POA: Insufficient documentation

## 2017-02-02 DIAGNOSIS — I4510 Unspecified right bundle-branch block: Secondary | ICD-10-CM | POA: Diagnosis present

## 2017-02-02 DIAGNOSIS — R404 Transient alteration of awareness: Secondary | ICD-10-CM | POA: Diagnosis not present

## 2017-02-02 DIAGNOSIS — R001 Bradycardia, unspecified: Secondary | ICD-10-CM | POA: Diagnosis present

## 2017-02-02 DIAGNOSIS — Z86010 Personal history of colonic polyps: Secondary | ICD-10-CM

## 2017-02-02 DIAGNOSIS — E785 Hyperlipidemia, unspecified: Secondary | ICD-10-CM | POA: Diagnosis present

## 2017-02-02 DIAGNOSIS — G4733 Obstructive sleep apnea (adult) (pediatric): Secondary | ICD-10-CM | POA: Diagnosis present

## 2017-02-02 DIAGNOSIS — N186 End stage renal disease: Secondary | ICD-10-CM | POA: Diagnosis present

## 2017-02-02 DIAGNOSIS — I5030 Unspecified diastolic (congestive) heart failure: Secondary | ICD-10-CM

## 2017-02-02 DIAGNOSIS — E11311 Type 2 diabetes mellitus with unspecified diabetic retinopathy with macular edema: Secondary | ICD-10-CM | POA: Diagnosis present

## 2017-02-02 HISTORY — DX: Syncope and collapse: R55

## 2017-02-02 LAB — TROPONIN I: TROPONIN I: 0.04 ng/mL — AB (ref <0.03)

## 2017-02-02 LAB — CBC
HCT: 40.6 % (ref 39.0–52.0)
HEMOGLOBIN: 13 g/dL (ref 13.0–17.0)
MCH: 29.3 pg (ref 26.0–34.0)
MCHC: 32 g/dL (ref 30.0–36.0)
MCV: 91.4 fL (ref 78.0–100.0)
PLATELETS: 226 10*3/uL (ref 150–400)
RBC: 4.44 MIL/uL (ref 4.22–5.81)
RDW: 16 % — AB (ref 11.5–15.5)
WBC: 13.5 10*3/uL — ABNORMAL HIGH (ref 4.0–10.5)

## 2017-02-02 LAB — PHOSPHORUS: Phosphorus: 3.8 mg/dL (ref 2.5–4.6)

## 2017-02-02 LAB — I-STAT TROPONIN, ED: TROPONIN I, POC: 0.07 ng/mL (ref 0.00–0.08)

## 2017-02-02 LAB — URINALYSIS, ROUTINE W REFLEX MICROSCOPIC
Bacteria, UA: NONE SEEN
Bilirubin Urine: NEGATIVE
GLUCOSE, UA: NEGATIVE mg/dL
Hgb urine dipstick: NEGATIVE
Ketones, ur: NEGATIVE mg/dL
Leukocytes, UA: NEGATIVE
Nitrite: NEGATIVE
PH: 5 (ref 5.0–8.0)
Protein, ur: 100 mg/dL — AB
SQUAMOUS EPITHELIAL / LPF: NONE SEEN
Specific Gravity, Urine: 1.011 (ref 1.005–1.030)

## 2017-02-02 LAB — BRAIN NATRIURETIC PEPTIDE: B NATRIURETIC PEPTIDE 5: 249 pg/mL — AB (ref 0.0–100.0)

## 2017-02-02 LAB — BASIC METABOLIC PANEL
Anion gap: 14 (ref 5–15)
BUN: 80 mg/dL — ABNORMAL HIGH (ref 6–20)
CHLORIDE: 96 mmol/L — AB (ref 101–111)
CO2: 25 mmol/L (ref 22–32)
Calcium: 9.5 mg/dL (ref 8.9–10.3)
Creatinine, Ser: 2.52 mg/dL — ABNORMAL HIGH (ref 0.61–1.24)
GFR calc Af Amer: 28 mL/min — ABNORMAL LOW (ref >60)
GFR, EST NON AFRICAN AMERICAN: 24 mL/min — AB (ref >60)
GLUCOSE: 192 mg/dL — AB (ref 65–99)
POTASSIUM: 3.1 mmol/L — AB (ref 3.5–5.1)
SODIUM: 135 mmol/L (ref 135–145)

## 2017-02-02 LAB — MAGNESIUM: MAGNESIUM: 2 mg/dL (ref 1.7–2.4)

## 2017-02-02 LAB — GLUCOSE, CAPILLARY
GLUCOSE-CAPILLARY: 406 mg/dL — AB (ref 65–99)
Glucose-Capillary: 245 mg/dL — ABNORMAL HIGH (ref 65–99)

## 2017-02-02 LAB — CBG MONITORING, ED: Glucose-Capillary: 191 mg/dL — ABNORMAL HIGH (ref 65–99)

## 2017-02-02 LAB — TSH: TSH: 2.391 u[IU]/mL (ref 0.350–4.500)

## 2017-02-02 MED ORDER — SODIUM CHLORIDE 0.9 % IV SOLN
INTRAVENOUS | Status: DC
Start: 2017-02-02 — End: 2017-02-02
  Administered 2017-02-02: 13:00:00 via INTRAVENOUS

## 2017-02-02 MED ORDER — ENOXAPARIN SODIUM 40 MG/0.4ML ~~LOC~~ SOLN
40.0000 mg | SUBCUTANEOUS | Status: DC
  Administered 2017-02-02: 40 mg via SUBCUTANEOUS
  Filled 2017-02-02: qty 0.4

## 2017-02-02 MED ORDER — POTASSIUM CHLORIDE CRYS ER 20 MEQ PO TBCR
40.0000 meq | EXTENDED_RELEASE_TABLET | Freq: Two times a day (BID) | ORAL | Status: AC
  Administered 2017-02-02 (×2): 40 meq via ORAL
  Filled 2017-02-02 (×2): qty 2

## 2017-02-02 MED ORDER — INSULIN NPH (HUMAN) (ISOPHANE) 100 UNIT/ML ~~LOC~~ SUSP
50.0000 [IU] | Freq: Two times a day (BID) | SUBCUTANEOUS | Status: DC
  Administered 2017-02-02 – 2017-02-03 (×2): 50 [IU] via SUBCUTANEOUS
  Filled 2017-02-02: qty 10

## 2017-02-02 MED ORDER — HYDRALAZINE HCL 25 MG PO TABS
100.0000 mg | ORAL_TABLET | Freq: Two times a day (BID) | ORAL | Status: DC
  Administered 2017-02-02 – 2017-02-03 (×2): 100 mg via ORAL
  Filled 2017-02-02 (×2): qty 4

## 2017-02-02 MED ORDER — SODIUM CHLORIDE 0.9% FLUSH
3.0000 mL | Freq: Two times a day (BID) | INTRAVENOUS | Status: DC
  Administered 2017-02-02 – 2017-02-03 (×2): 3 mL via INTRAVENOUS

## 2017-02-02 MED ORDER — ACETAMINOPHEN 650 MG RE SUPP: 650.0000 mg | Freq: Four times a day (QID) | RECTAL | Status: DC | PRN

## 2017-02-02 MED ORDER — INSULIN ASPART 100 UNIT/ML ~~LOC~~ SOLN
8.0000 [IU] | Freq: Once | SUBCUTANEOUS | Status: AC
  Administered 2017-02-02: 8 [IU] via SUBCUTANEOUS

## 2017-02-02 MED ORDER — ONDANSETRON HCL 4 MG PO TABS: 4.0000 mg | ORAL_TABLET | Freq: Four times a day (QID) | ORAL | Status: DC | PRN

## 2017-02-02 MED ORDER — ALLOPURINOL 300 MG PO TABS
300.0000 mg | ORAL_TABLET | Freq: Two times a day (BID) | ORAL | Status: DC
  Administered 2017-02-02 – 2017-02-03 (×2): 300 mg via ORAL
  Filled 2017-02-02 (×2): qty 1

## 2017-02-02 MED ORDER — ATORVASTATIN CALCIUM 80 MG PO TABS
80.0000 mg | ORAL_TABLET | Freq: Every day | ORAL | Status: DC
  Administered 2017-02-02 – 2017-02-03 (×2): 80 mg via ORAL
  Filled 2017-02-02 (×2): qty 1

## 2017-02-02 MED ORDER — METOCLOPRAMIDE HCL 5 MG/ML IJ SOLN
10.0000 mg | Freq: Two times a day (BID) | INTRAMUSCULAR | Status: AC
  Filled 2017-02-02: qty 2

## 2017-02-02 MED ORDER — MORPHINE SULFATE (PF) 2 MG/ML IV SOLN: 1.0000 mg | Freq: Once | INTRAVENOUS | Status: DC

## 2017-02-02 MED ORDER — ASPIRIN EC 325 MG PO TBEC
325.0000 mg | DELAYED_RELEASE_TABLET | Freq: Every day | ORAL | Status: DC
  Administered 2017-02-03: 325 mg via ORAL
  Filled 2017-02-02: qty 1

## 2017-02-02 MED ORDER — INSULIN ASPART 100 UNIT/ML ~~LOC~~ SOLN
0.0000 [IU] | Freq: Three times a day (TID) | SUBCUTANEOUS | Status: DC
  Administered 2017-02-02 – 2017-02-03 (×3): 3 [IU] via SUBCUTANEOUS

## 2017-02-02 MED ORDER — ACETAMINOPHEN 325 MG PO TABS: 650.0000 mg | ORAL_TABLET | Freq: Four times a day (QID) | ORAL | Status: DC | PRN

## 2017-02-02 MED ORDER — ONDANSETRON HCL 4 MG/2ML IJ SOLN: 4.0000 mg | Freq: Four times a day (QID) | INTRAMUSCULAR | Status: DC | PRN

## 2017-02-02 MED ORDER — ASPIRIN 81 MG PO CHEW
243.0000 mg | CHEWABLE_TABLET | Freq: Once | ORAL | Status: DC
Start: 2017-02-02 — End: 2017-02-03

## 2017-02-02 MED ORDER — ONDANSETRON HCL 4 MG/2ML IJ SOLN
4.0000 mg | Freq: Once | INTRAMUSCULAR | Status: AC
Start: 2017-02-02 — End: 2017-02-02
  Administered 2017-02-02: 4 mg via INTRAVENOUS
  Filled 2017-02-02: qty 2

## 2017-02-02 MED ORDER — SENNOSIDES-DOCUSATE SODIUM 8.6-50 MG PO TABS
1.0000 | ORAL_TABLET | Freq: Two times a day (BID) | ORAL | Status: DC
  Administered 2017-02-02 – 2017-02-03 (×2): 1 via ORAL
  Filled 2017-02-02 (×2): qty 1

## 2017-02-02 MED ORDER — INSULIN ASPART 100 UNIT/ML ~~LOC~~ SOLN: 0.0000 [IU] | Freq: Every day | SUBCUTANEOUS | Status: DC

## 2017-02-02 MED ORDER — KETOROLAC TROMETHAMINE 0.5 % OP SOLN: 1.0000 [drp] | Freq: Four times a day (QID) | OPHTHALMIC | Status: DC

## 2017-02-02 MED ORDER — SODIUM CHLORIDE 0.9 % IV SOLN: INTRAVENOUS | Status: AC

## 2017-02-02 MED ORDER — ISOSORBIDE MONONITRATE 20 MG PO TABS
20.0000 mg | ORAL_TABLET | Freq: Two times a day (BID) | ORAL | Status: DC
  Administered 2017-02-02 – 2017-02-03 (×2): 20 mg via ORAL
  Filled 2017-02-02 (×3): qty 1

## 2017-02-02 MED ORDER — COLCHICINE 0.6 MG PO TABS: 0.3000 mg | ORAL_TABLET | Freq: Every day | ORAL | Status: DC

## 2017-02-02 MED ORDER — GABAPENTIN 300 MG PO CAPS
300.0000 mg | ORAL_CAPSULE | Freq: Two times a day (BID) | ORAL | Status: DC
  Administered 2017-02-02 – 2017-02-03 (×2): 300 mg via ORAL
  Filled 2017-02-02 (×2): qty 1

## 2017-02-02 MED ORDER — CLONIDINE HCL 0.2 MG PO TABS
0.2000 mg | ORAL_TABLET | Freq: Every day | ORAL | Status: DC
  Administered 2017-02-03: 0.2 mg via ORAL
  Filled 2017-02-02: qty 1

## 2017-02-02 MED ORDER — TRIAMCINOLONE ACETONIDE 0.5 % EX OINT
1.0000 "application " | TOPICAL_OINTMENT | Freq: Two times a day (BID) | CUTANEOUS | Status: DC
  Administered 2017-02-02 – 2017-02-03 (×2): 1 via TOPICAL
  Filled 2017-02-02: qty 15

## 2017-02-02 MED ORDER — ASPIRIN EC 81 MG PO TBEC: 81.0000 mg | DELAYED_RELEASE_TABLET | Freq: Every day | ORAL | Status: DC

## 2017-02-02 MED ORDER — FUROSEMIDE 20 MG PO TABS
80.0000 mg | ORAL_TABLET | Freq: Two times a day (BID) | ORAL | Status: DC
  Administered 2017-02-03: 80 mg via ORAL
  Filled 2017-02-02: qty 4

## 2017-02-02 MED ORDER — SODIUM CHLORIDE 0.9 % IV SOLN
500.0000 mL | INTRAVENOUS | Status: DC
Start: 2017-02-02 — End: 2017-02-03

## 2017-02-02 NOTE — Telephone Encounter (Signed)
[name] called and stated that husband passed out this morning on the commode.  BG was 150.  Now is having a headache, nauseated, and weak.  Bp 78/48.  [name] told to take to the ER for care.

## 2017-02-02 NOTE — Progress Notes (Signed)
CRITICAL VALUE ALERT  Critical value received:  Troponin 0.04  Date of notification:  02/02/2017  Time of notification:  4436  Critical value read back:Yes.    Nurse who received alert:  Edward Qualia RN  MD notified (1st page):  Dr Aggie Moats  Time of first page:  67  Responding MD:  Dr Aggie Moats  Time MD responded: 1630

## 2017-02-02 NOTE — H&P (Signed)
History and Physical    LISLE SKILLMAN KDX:[id number] DOB: November 06, 1946 DOA: 02/02/2017  PCP: [name], MD Patient coming from: home  Chief Complaint: syncope and collapse  HPI: EZIAH NEGRO is a very pleasant 71 y.o. male with medical history significant of diabetes, hypertension, chronic kidney disease, diastolic heart failure, CAD, chronic diastolic heart failure, diabetic retinopathy presents to the emergency Department chief complaint of syncope and collapse. Initial evaluation reveals mild bradycardia otherwise unremarkable.  Information is obtained from the patient and his wife who is at the bedside. Patient reports he was in his usual state of health until this morning he got up began his morning routine of brushing his teeth taking his medicines have a bowel movement when after his bowel movement he had a syncopal event fell off the commode. Wife states she heard him fall off the commode and that he made a loud "moaning noise" and then she heard him fall onto the floor. He denies headache dizziness chest pain palpitations shortness of breath diaphoresis nausea vomiting. The wife states she immediately.perhaps his blood sugar was too low she checked it it was 156. She reports she tried to arouse him for "a little while" and he would not wake up. He was breathing and she felt a pulse she called EMS and by the time they arrived he was alert pale diaphoretic and complaining of a headache. He was transported to the emergency department when he arrived he had one episode of non-coffee ground emesis. She denies any abdominal pain constipation diarrhea melena bright red blood per rectum. He denies any fever chills cough lower extremity edema dysuria hematuria frequency or urgency. He reports he weighs every day and if his weight goes up 2 pounds a 5 day. He'll take an extra Lasix or when necessary ZAROXOLYN wife reports his weight has been steady of late.  ED Course: In the emergency  department he is afebrile hemodynamically stable with a heart rate at the low end of normal he is not hypoxic.  Review of Systems: As per HPI otherwise 10 point review of systems negative.   Ambulatory Status: He ambulates with a cane wears a boot on his right foot for charcot's joint. no Recent falls  Past Medical History:  Diagnosis Date  . Brittle bones    per pt, has soft bones in right foot/wears boot cast!  . CAD (coronary artery disease)   . Cataract    Bil/ surg scheduled for right eye 01/18/17/ left eye 02/08/17  . CHF (congestive heart failure) ([place]) 2015  . Diabetes mellitus   . Heart failure, diastolic ([place])   . Hyperlipidemia   . Hypertension   . Macular edema 2014  . OSA on CPAP   . Personal history of colonic polyps - adenomas 01/28/2014  . Renal insufficiency   . Shortness of breath dyspnea   . Syncope and collapse     Past Surgical History:  Procedure Laterality Date  . CARPAL TUNNEL RELEASE     left hand  . COLONOSCOPY    . KNEE ARTHROSCOPY Right 09/13/2016   [facility], Dr. Dorna Leitz  . PILONIDAL CYST EXCISION      Social History   Social History  . Marital status: Married    Spouse name: N/A  . Number of children: 5  . Years of education: N/A   Occupational History  . Warehouse    Social History Main Topics  . Smoking status: Former Smoker    Packs/day: 0.50  Years: 20.00    Types: Cigarettes    Quit date: 03/06/1977  . Smokeless tobacco: Never Used  . Alcohol use No  . Drug use: No  . Sexual activity: No   Other Topics Concern  . Not on file   Social History Narrative  . No narrative on file    Allergies  Allergen Reactions  . Hydrocodone Nausea And Vomiting  . Oxycodone Nausea And Vomiting    Family History  Problem Relation Age of Onset  . Diabetes Mother   . Kidney disease Mother   . Diabetes Father   . Coronary artery disease Father   . Diabetes Brother   . Diabetes Sister   . Cancer Maternal Uncle   .  Diabetes Maternal Grandmother   . Cancer Paternal Grandmother   . Heart attack Paternal Grandfather   . Colon cancer Neg Hx     Prior to Admission medications   Medication Sig Start Date End Date Taking? Authorizing Provider  allopurinol (ZYLOPRIM) 300 MG tablet Take 1 tablet (300 mg total) by mouth 2 (two) times daily. 12/12/16  Yes [name], MD  aspirin 81 MG tablet Take 81 mg by mouth daily.     Yes Historical Provider, MD  atorvastatin (LIPITOR) 80 MG tablet Take 1 tablet (80 mg total) by mouth daily. 04/05/16  Yes Lelon Perla, MD  cloNIDine (CATAPRES) 0.2 MG tablet Take 1 tablet (0.2 mg total) by mouth daily. 08/08/16  Yes Lelon Perla, MD  colchicine 0.6 MG tablet Take 1 tablet (0.6 mg total) by mouth 2 (two) times daily. 06/15/16  Yes Silverio Decamp, MD  furosemide (LASIX) 80 MG tablet Take 1 tablet by mouth 2 (two) times daily.  10/08/14  Yes Historical Provider, MD  gabapentin (NEURONTIN) 300 MG capsule Take 1 capsule (300 mg total) by mouth 2 (two) times daily. 06/15/16  Yes Silverio Decamp, MD  hydrALAZINE (APRESOLINE) 100 MG tablet Take 100 mg by mouth 2 (two) times daily.  03/11/15  Yes Lelon Perla, MD  isosorbide mononitrate (ISMO,MONOKET) 20 MG tablet Take 1 tablet (20 mg total) by mouth 2 (two) times daily at 10 AM and 5 PM. 01/22/17  Yes Lelon Perla, MD  metolazone (ZAROXOLYN) 5 MG tablet Take 5 mg by mouth daily as needed (swelling).  03/24/16  Yes Historical Provider, MD  metoprolol succinate (TOPROL-XL) 100 MG 24 hr tablet Take 100 mg by mouth daily. 11/04/14  Yes Historical Provider, MD  nitroGLYCERIN (NITROSTAT) 0.4 MG SL tablet Place 1 tablet (0.4 mg total) under the tongue every 5 (five) minutes as needed for chest pain. 10/07/14  Yes Almyra Deforest, PA  NON FORMULARY Pt is on two types of eye drops that were given to him by Dr. Does not know the names. Family brining from home.   Yes Historical Provider, MD  NOVOLIN N 100 UNIT/ML injection  Novolin 50 units twice a day 12/05/16  Yes Historical Provider, MD  NOVOLIN R 100 UNIT/ML injection 15-20 units TID with meals using a sliding scale 02/10/16  Yes Historical Provider, MD  triamcinolone ointment (KENALOG) 0.5 % Apply 1 application topically 2 (two) times daily. To affected area, avoid eyes and face 06/17/15  Yes Silverio Decamp, MD  blood glucose meter kit and supplies Dispense based on patient and insurance preference. Test blood sugar three times daily. Diagnosis Diabetes ICD - 10 E 11.9 05/22/16   [name], MD  ketorolac (ACULAR) 0.5 % ophthalmic solution Place  1 drop into the right eye 4 (four) times daily. For 10 days 01/19/17   Historical Provider, MD    Physical Exam: Vitals:   02/02/17 1400 02/02/17 1415 02/02/17 1430 02/02/17 1445  BP: (!) 152/72 132/62 (!) 135/58 (!) 143/70  Pulse: 63 61    Resp: (!) _0 Temp:      TempSrc:      SpO2: 97% 99%    Weight:      Height:         General:  Appears calm and comfortable, slightly lethargic and flushed Eyes:  PERRL, EOMI, normal lids, iris ENT:  grossly normal hearing, lips & tongue, Mucous membranes of his mouth are pink but dry Neck:  no LAD, masses or thyromegaly Cardiovascular:  RRR, no m/r/g. No LE edema.  Respiratory:  CTA bilaterally, no w/r/r. Normal respiratory effort. Abdomen:  soft, ntnd, obese. Sluggish bowel sounds no guarding or rebounding Skin:  no rash or induration seen on limited exam flushed face  Musculoskeletal:  grossly normal tone BUE/BLE, good ROM, no bony abnormality right foot with swelling/tenderness. No erythema or heat PPP Psychiatric:  grossly normal mood and affect, speech fluent and appropriate, AOx3 Neurologic:  CN 2-12 grossly intact, moves all extremities in coordinated fashion, sensation intact  Labs on Admission: I have personally reviewed following labs and imaging studies  CBC:  Recent Labs Lab 02/02/17 1145  WBC 13.5*  HGB 13.0  HCT 40.6  MCV 91.4    PLT 426   Basic Metabolic Panel:  Recent Labs Lab 02/02/17 1145  NA 135  K 3.1*  CL 96*  CO2 25  GLUCOSE 192*  BUN 80*  CREATININE 2.52*  CALCIUM 9.5   GFR: Estimated Creatinine Clearance: 31.1 mL/min (A) (by C-G formula based on SCr of 2.52 mg/dL (H)). Liver Function Tests: No results for input(s): AST, ALT, ALKPHOS, BILITOT, PROT, ALBUMIN in the last 168 hours. No results for input(s): LIPASE, AMYLASE in the last 168 hours. No results for input(s): AMMONIA in the last 168 hours. Coagulation Profile: No results for input(s): INR, PROTIME in the last 168 hours. Cardiac Enzymes: No results for input(s): CKTOTAL, CKMB, CKMBINDEX, TROPONINI in the last 168 hours. BNP (last 3 results) No results for input(s): PROBNP in the last 8760 hours. HbA1C: No results for input(s): HGBA1C in the last 72 hours. CBG:  Recent Labs Lab 02/02/17 1204  GLUCAP 191*   Lipid Profile: No results for input(s): CHOL, HDL, LDLCALC, TRIG, CHOLHDL, LDLDIRECT in the last 72 hours. Thyroid Function Tests: No results for input(s): TSH, T4TOTAL, FREET4, T3FREE, THYROIDAB in the last 72 hours. Anemia Panel: No results for input(s): VITAMINB12, FOLATE, FERRITIN, TIBC, IRON, RETICCTPCT in the last 72 hours. Urine analysis:    Component Value Date/Time   COLORURINE YELLOW 02/02/2017 1146   APPEARANCEUR CLEAR 02/02/2017 1146   LABSPEC 1.011 02/02/2017 1146   PHURINE 5.0 02/02/2017 1146   GLUCOSEU NEGATIVE 02/02/2017 1146   HGBUR NEGATIVE 02/02/2017 1146   BILIRUBINUR NEGATIVE 02/02/2017 1146   BILIRUBINUR neg 01/28/2016 [place] 02/02/2017 1146   PROTEINUR 100 (A) 02/02/2017 1146   UROBILINOGEN 0.2 01/28/2016 1159   NITRITE NEGATIVE 02/02/2017 1146   LEUKOCYTESUR NEGATIVE 02/02/2017 1146    Creatinine Clearance: Estimated Creatinine Clearance: 31.1 mL/min (A) (by C-G formula based on SCr of 2.52 mg/dL (H)).  Sepsis Labs: _1 (procalcitonin:4,lacticidven:4) )No  results found for this or any previous visit (from the past 240 hour(s)).   Radiological Exams on  Admission: Dg Chest 2 View  Result Date: 02/02/2017 CLINICAL DATA:  Patient with syncopal episode. Headache. Nausea and vomiting. EXAM: CHEST  2 VIEW COMPARISON:  Chest radiograph 10/05/2014. FINDINGS: Cardiomegaly. Tortuosity of the thoracic aorta. No consolidative pulmonary opacities. No pleural effusion or pneumothorax. Mid thoracic spine degenerative changes. IMPRESSION: Cardiomegaly.  No acute cardiopulmonary process. Electronically Signed   By: Lovey Newcomer M.D.   On: 02/02/2017 12:21   Ct Head Wo Contrast  Result Date: 02/02/2017 CLINICAL DATA:  Headache and syncope.  Nausea.  Dizziness. EXAM: CT HEAD WITHOUT CONTRAST TECHNIQUE: Contiguous axial images were obtained from the base of the skull through the vertex without intravenous contrast. COMPARISON:  None. FINDINGS: Brain: There is moderate diffuse atrophy. There is no intracranial mass, hemorrhage, extra-axial fluid collection, midline shift. There is patchy small vessel disease throughout the centra semiovale bilaterally. There is evidence of a prior infarct in the anterior left corona radiata. There is evidence of a prior small lacunar infarct in each thalamus. There is no acute appearing infarct evident. Vascular: There is no hyperdense vessel. There is calcification in each carotid siphon as well as in the distal left vertebral artery. Skull: The bony calvarium appears intact. Sinuses/Orbits: There is extensive mucosal thickening in the left maxillary antrum. There is opacification in several ethmoid sinuses bilaterally. Visualized paranasal sinuses elsewhere clear. Orbits appear symmetric bilaterally except for prior removal of cataract on the right. Other: Mastoid air cells which are visualized are clear. IMPRESSION: Atrophy with supratentorial small vessel disease. Prior small infarcts as noted above. No acute infarct. No mass, hemorrhage, or  extra-axial fluid collection. Foci of arterial vascular calcification noted. Paranasal sinus disease noted most severe in the left maxillary antrum. Electronically Signed   By: Lowella Grip III M.D.   On: 02/02/2017 13:07    EKG: Independently reviewed. Sinus rhythm Prolonged PR interval Nonspecific intraventricular conduction delay Inferior infarct, old  Assessment/Plan Principal Problem:   Syncope and collapse Active Problems:   Obstructive sleep apnea   HYPERTENSION, BENIGN SYSTEMIC   Chronic kidney disease, stage 4, severely decreased GFR (HCC)   RBBB   Hyperlipidemia   Bradycardia   CAD (coronary artery disease), native coronary artery   Obesity (BMI 30-39.9)   Diabetic retinopathy (HCC)   Macular edema, diabetic (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 Syncope and collapse. Shortly after having bowel movement. hx  Bradycardia in the setting of beta blocker. CT of the head without acute abnormalities. EKG as noted above. Initial troponin negative. No s/sx infection. No metabolic derangement. Recent echo with 60-65%. Of note patient with tendency for constipation -Admit to telemetry -Obtain orthostatic vital signs -Beta blocker for now -Gentle IV fluids -hold lasix for now -colace  #2. bradycardia. Patient with history of same. Home medications include metoprolol. EKG as noted above initial troponin negative. No chest pain recent echo with an EF of 60-65%.  -Hold beta blocker for now -obtain tsh -colace  3. Hypertension. Fair control in the emergency department. Home meds include clonidine, Lasix, hydralazine, imdur, Toprol -continue clonidine, apresaline, imdur. -hold BB -resume lasix tomorrow  #4. CAD. No chest pain. EKG as noted above. Initial troponin negative. Chart review indicates IV and April 2012 revealing apical ischemia and an ejection fraction of  56%. Underwent cardiac cath. Medical therapy recommended. 2015 hospitalized with acute diastolic heart failure he was diuresed. Echo  at that time revealed EF of 55% and grade 2 diastolic dysfunction -Continue aspirin -Continue statin -Cycle troponin -continue imdur  #5. Chronic diastolic heart failure. Echo 2015 reveals normal LV function grade 2 diastolic dysfunction. Home meds incluide BB and lasix and prn zaroxolyn. -Echocardiogram -Hold beta blocker for now -Hold Lasix for now -Daily weights -Monitor intake and output  #6. Chronic kidney disease stage IV. Chart review indicates patient developed significant renal insufficiency with diuresis required November 2015. Followed by nephrology. Was being considered for transplant. Creatinine 2.52 on admission chart review indicates this is close to baseline -Hold nephrotoxins -Gentle IV fluids -Monitor urine output -Recheck in the morning  #7. Diabetes. Serum glucose and 192 on admission -Hold oral agents for now -Obtain hemoglobin A1c -sliding scale insulin for optimal control  #8. Hypokalemia. Potassium 3.1. Likely related to Lasix -replete -recheck   DVT prophylaxis: heparin  Code Status: full  Family Communication: wife and daughters at baseline  Disposition Plan: home  Consults called: none  Admission status: obs    Radene Gunning MD [facility]  If 7PM-7AM, please contact night-coverage [email or web address] Password [facility]  02/02/2017, 3:13 PM

## 2017-02-02 NOTE — ED Notes (Signed)
Pt reporting HA and nausea. Dr. Rogene Houston informed.

## 2017-02-02 NOTE — ED Notes (Signed)
Dr. Zackowski at bedside  

## 2017-02-02 NOTE — ED Provider Notes (Signed)
[facility] DEPT Provider Note   CSN: [id number] Arrival date & time: 02/02/17  1134     History   Chief Complaint Chief Complaint  Patient presents with  . Abnormal ECG    HPI Louis [name] is a 71 y.o. male.  Patient brought in by EMS. Patient had a syncopal episode that occurred at home witnessed by his wife. Patient was found on the floor the bedroom. He was unconscious at least for 2 minutes. No report of any seizure activity. When he woke up he was pale and diaphoretic and complaining of a headache. EKG was done by EMS and they were concerned about a possible inferior wall STEMI. That EKG was referred on to cardiology. That was unchanged from his previous one so STEMI was canceled. EMS gave patient Zofran as well as 324 mg of aspirin. Blood pressure was 141/68. Patient was started on 4 L nasal cannula oxygen. And continued on that. Patient denied any chest pain or any shortness of breath. Patient without a history of similar problems. Patient does have a history of diastolic heart failure hyperlipidemia hypertension coronary artery disease.      Past Medical History:  Diagnosis Date  . Brittle bones    per pt, has soft bones in right foot/wears boot cast!  . CAD (coronary artery disease)   . Cataract    Bil/ surg scheduled for right eye 01/18/17/ left eye 02/08/17  . CHF (congestive heart failure) ([place]) 2015  . Diabetes mellitus   . Heart failure, diastolic ([place])   . Hyperlipidemia   . Hypertension   . Macular edema 2014  . OSA on CPAP   . Personal history of colonic polyps - adenomas 01/28/2014  . Renal insufficiency   . Shortness of breath dyspnea     Patient Active Problem List   Diagnosis Date Noted  . 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 Diabetic polyneuropathy associated with type 2 diabetes mellitus ([place]) 12/25/2016  . Idiopathic chronic venous hypertension of both lower extremities with inflammation 12/25/2016    . Cataract, right 05/18/2016  . Right cervical radiculopathy 05/12/2016  . 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 ([place]) 10/12/2015  . 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 Gout 04/20/2015  . Osteoarthritis of both acromioclavicular joints 12/03/2014  . Left shoulder pain 11/17/2014  . Unstable angina pectoris ([place]) 10/05/2014  . Acute diastolic heart failure ([place]) 10/05/2014  . History of colonic polyps 01/28/2014  . Bilateral carpal tunnel syndrome 01/09/2014  . Diabetic retinopathy ([place]) 10/31/2013  . Macular edema, diabetic ([place]) 10/31/2013  . Obesity (BMI 30-39.9) 07/31/2013  . Fatigue 03/06/2012  . Astigmatism 01/11/2012  . CAD (coronary artery disease), native coronary artery 05/17/2011  . Nonspecific abnormal unspecified cardiovascular function study 03/01/2011  . Bradycardia 02/08/2011  . Right knee pain 02/08/2011  . Hyperlipidemia   . RBBB 02/01/2011  . Chronic kidney disease, stage 4, severely decreased GFR (HCC) 10/23/2006  . Proteinuria 10/23/2006  . Obstructive sleep apnea 10/05/2006  . Diabetes mellitus with stage 4 chronic kidney disease ([place]) 08/28/2006  . HYPERTENSION, BENIGN SYSTEMIC 08/28/2006    Past Surgical History:  Procedure Laterality Date  . CARPAL TUNNEL RELEASE     left hand  . COLONOSCOPY    . KNEE ARTHROSCOPY Right 09/13/2016   [facility], Dr. Dorna Leitz  . [place]  Medications    Prior to Admission medications   Medication Sig Start Date End Date Taking? Authorizing Provider  allopurinol (ZYLOPRIM) 300 MG tablet Take 1 tablet (300 mg total) by mouth 2 (two) times daily. 12/12/16  Yes [name], MD  aspirin 81 MG tablet Take 81 mg by mouth daily.     Yes Historical Provider, MD  atorvastatin (LIPITOR) 80 MG tablet Take 1 tablet (80 mg total) by mouth daily. 04/05/16  Yes Lelon Perla, MD   cloNIDine (CATAPRES) 0.2 MG tablet Take 1 tablet (0.2 mg total) by mouth daily. 08/08/16  Yes Lelon Perla, MD  colchicine 0.6 MG tablet Take 1 tablet (0.6 mg total) by mouth 2 (two) times daily. 06/15/16  Yes Silverio Decamp, MD  furosemide (LASIX) 80 MG tablet Take 1 tablet by mouth 2 (two) times daily.  10/08/14  Yes Historical Provider, MD  gabapentin (NEURONTIN) 300 MG capsule Take 1 capsule (300 mg total) by mouth 2 (two) times daily. 06/15/16  Yes Silverio Decamp, MD  hydrALAZINE (APRESOLINE) 100 MG tablet Take 100 mg by mouth 2 (two) times daily.  03/11/15  Yes Lelon Perla, MD  isosorbide mononitrate (ISMO,MONOKET) 20 MG tablet Take 1 tablet (20 mg total) by mouth 2 (two) times daily at 10 AM and 5 PM. 01/22/17  Yes Lelon Perla, MD  metolazone (ZAROXOLYN) 5 MG tablet Take 5 mg by mouth daily as needed (swelling).  03/24/16  Yes Historical Provider, MD  metoprolol succinate (TOPROL-XL) 100 MG 24 hr tablet Take 100 mg by mouth daily. 11/04/14  Yes Historical Provider, MD  nitroGLYCERIN (NITROSTAT) 0.4 MG SL tablet Place 1 tablet (0.4 mg total) under the tongue every 5 (five) minutes as needed for chest pain. 10/07/14  Yes Almyra Deforest, PA  NON FORMULARY Pt is on two types of eye drops that were given to him by Dr. Does not know the names. Family brining from home.   Yes Historical Provider, MD  NOVOLIN N 100 UNIT/ML injection Novolin 50 units twice a day 12/05/16  Yes Historical Provider, MD  NOVOLIN R 100 UNIT/ML injection 15-20 units TID with meals using a sliding scale 02/10/16  Yes Historical Provider, MD  triamcinolone ointment (KENALOG) 0.5 % Apply 1 application topically 2 (two) times daily. To affected area, avoid eyes and face 06/17/15  Yes Silverio Decamp, MD  blood glucose meter kit and supplies Dispense based on patient and insurance preference. Test blood sugar three times daily. Diagnosis Diabetes ICD - 10 E 11.9 05/22/16   [name], MD  ketorolac  (ACULAR) 0.5 % ophthalmic solution Place 1 drop into the right eye 4 (four) times daily. For 10 days 01/19/17   Historical Provider, MD    Family History Family History  Problem Relation Age of Onset  . Diabetes Mother   . Kidney disease Mother   . Diabetes Father   . Coronary artery disease Father   . Diabetes Brother   . Diabetes Sister   . Cancer Maternal Uncle   . Diabetes Maternal Grandmother   . Cancer Paternal Grandmother   . Heart attack Paternal Grandfather   . Colon cancer Neg Hx     Social History Social History  Substance Use Topics  . Smoking status: Former Smoker    Packs/day: 0.50    Years: 20.00    Types: Cigarettes    Quit date: 03/06/1977  . Smokeless tobacco: Never Used  . Alcohol use No  Allergies   Hydrocodone and Oxycodone   Review of Systems Review of Systems  Constitutional: Positive for diaphoresis. Negative for fever.  HENT: Negative for congestion.   Eyes: Negative for visual disturbance.  Respiratory: Negative for shortness of breath.   Cardiovascular: Negative for chest pain.  Gastrointestinal: Negative for abdominal pain, nausea and vomiting.  Genitourinary: Negative for dysuria.  Musculoskeletal: Negative for back pain and neck pain.  Neurological: Positive for syncope and headaches.  Hematological: Does not bruise/bleed easily.  Psychiatric/Behavioral: Negative for confusion.     Physical Exam Updated Vital Signs BP (!) 128/57   Pulse (!) 55   Temp 98.5 F (36.9 C) (Oral)   Resp 11   Ht 5' 8"  (1.727 m)   Wt 102.1 kg   SpO2 97%   BMI 34.21 kg/m   Physical Exam  Constitutional: He is oriented to person, place, and time. He appears well-developed and well-nourished. No distress.  HENT:  Head: Normocephalic and atraumatic.  Mouth/Throat: Oropharynx is clear and moist.  Eyes: EOM are normal. Pupils are equal, round, and reactive to light.  Neck: Normal range of motion. Neck supple.  Cardiovascular: Normal rate, regular  rhythm and intact distal pulses.   Pulmonary/Chest: Effort normal and breath sounds normal. No respiratory distress.  Abdominal: Soft. Bowel sounds are normal. There is no tenderness.  Musculoskeletal: Normal range of motion.  Neurological: He is alert and oriented to person, place, and time. No cranial nerve deficit or sensory deficit. He exhibits normal muscle tone. Coordination normal.  Skin: Skin is warm.  Nursing note and vitals reviewed.    ED Treatments / Results  Labs (all labs ordered are listed, but only abnormal results are displayed) Labs Reviewed  BASIC METABOLIC PANEL - Abnormal; Notable for the following:       Result Value   Potassium 3.1 (*)    Chloride 96 (*)    Glucose, Bld 192 (*)    BUN 80 (*)    Creatinine, Ser 2.52 (*)    GFR calc non Af Amer 24 (*)    GFR calc Af Amer 28 (*)    All other components within normal limits  CBC - Abnormal; Notable for the following:    WBC 13.5 (*)    RDW 16.0 (*)    All other components within normal limits  URINALYSIS, ROUTINE W REFLEX MICROSCOPIC - Abnormal; Notable for the following:    Protein, ur 100 (*)    All other components within normal limits  CBG MONITORING, ED - Abnormal; Notable for the following:    Glucose-Capillary 191 (*)    All other components within normal limits  BRAIN NATRIURETIC PEPTIDE  I-STAT TROPOININ, ED    EKG  EKG Interpretation  Date/Time:  Friday February 02 2017 11:37:16 EDT Ventricular Rate:  58 PR Interval:    QRS Duration: 118 QT Interval:  500 QTC Calculation: 492 R Axis:   -20 Text Interpretation:  Sinus rhythm Prolonged PR interval Nonspecific intraventricular conduction delay Inferior infarct, old Lateral leads are also involved Confirmed by Rogene Houston  MD, Edita Weyenberg [phone]) on 02/02/2017 11:42:32 AM Also confirmed by Rogene Houston  MD, Khyron Garno [phone]), editor Drema Pry [phone])  on 02/02/2017 11:53:35 AM       Radiology Dg Chest 2 View  Result Date: 02/02/2017 CLINICAL  DATA:  Patient with syncopal episode. Headache. Nausea and vomiting. EXAM: CHEST  2 VIEW COMPARISON:  Chest radiograph 10/05/2014. FINDINGS: Cardiomegaly. Tortuosity of the thoracic aorta. No consolidative pulmonary opacities. No pleural effusion  or pneumothorax. Mid thoracic spine degenerative changes. IMPRESSION: Cardiomegaly.  No acute cardiopulmonary process. Electronically Signed   By: Lovey Newcomer M.D.   On: 02/02/2017 12:21   Ct Head Wo Contrast  Result Date: 02/02/2017 CLINICAL DATA:  Headache and syncope.  Nausea.  Dizziness. EXAM: CT HEAD WITHOUT CONTRAST TECHNIQUE: Contiguous axial images were obtained from the base of the skull through the vertex without intravenous contrast. COMPARISON:  None. FINDINGS: Brain: There is moderate diffuse atrophy. There is no intracranial mass, hemorrhage, extra-axial fluid collection, midline shift. There is patchy small vessel disease throughout the centra semiovale bilaterally. There is evidence of a prior infarct in the anterior left corona radiata. There is evidence of a prior small lacunar infarct in each thalamus. There is no acute appearing infarct evident. Vascular: There is no hyperdense vessel. There is calcification in each carotid siphon as well as in the distal left vertebral artery. Skull: The bony calvarium appears intact. Sinuses/Orbits: There is extensive mucosal thickening in the left maxillary antrum. There is opacification in several ethmoid sinuses bilaterally. Visualized paranasal sinuses elsewhere clear. Orbits appear symmetric bilaterally except for prior removal of cataract on the right. Other: Mastoid air cells which are visualized are clear. IMPRESSION: Atrophy with supratentorial small vessel disease. Prior small infarcts as noted above. No acute infarct. No mass, hemorrhage, or extra-axial fluid collection. Foci of arterial vascular calcification noted. Paranasal sinus disease noted most severe in the left maxillary antrum. Electronically  Signed   By: Lowella Grip III M.D.   On: 02/02/2017 13:07    Procedures Procedures (including critical care time)  Medications Ordered in ED Medications  0.9 %  sodium chloride infusion ( Intravenous New Bag/Given 02/02/17 1324)  ondansetron (ZOFRAN) injection 4 mg (4 mg Intravenous Given 02/02/17 1310)     Initial Impression / Assessment and Plan / ED Course  I have reviewed the triage vital signs and the nursing notes.  Pertinent labs & imaging results that were available during my care of the patient were reviewed by me and considered in my medical decision making (see chart for details).    Patient was syncopal episode at home unexplained. Witnessed by wife. Patient was unconscious for 2 minutes. No evidence of any seizure type disorder. GERD on the bedroom floor. Patient was diaphoretic when EMS arrived. EMS was concerned about possible inferior wall STEMI. EKG was referred in but patient wasn't having any chest pain. Reviewed by cardiology and STEMI was canceled. Patient asymptomatic here no complaints. Workup without any acute findings.  Troponin was negative. CT head was negative chest x-ray negative other labs without significant abnormalities.  Due to the syncopal episode patient requires cardiac monitoring. Contacted admitting service and they will admit to telemetry.   Final Clinical Impressions(s) / ED Diagnoses   Final diagnoses:  Syncope and collapse    New Prescriptions New Prescriptions   No medications on file     Fredia Sorrow, MD 02/02/17 1615

## 2017-02-02 NOTE — Progress Notes (Signed)
Office Visit Note   Patient: Louis [name]           Date of Birth: 10/25/1946           MRN: [id number] Visit Date: 02/05/2017              Requested by: [name], [address] [place], [place] [address] PCP: [name], MD  Chief Complaint  Patient presents with  . Right Foot - Follow-up    HPI: Patient is a 71 y.o male who presents today for right foot follow up charcot collapse. He is pending brace wear fabrication. He is also wearing a Vive compression sock. He states that wearing the fracture boot makes his foot feel better but is "causing me hip pain" the pt sates that it is lateral side hip pain not groin. He states that he is not able to take anti inflammatories due to his kidney disease. Pamella Pert, RMA      Assessment & Plan: Visit Diagnoses:  1. 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  Follow-Up Instructions: Return in about 4 weeks (around 03/05/2017).   Ortho Exam Patient is alert oriented no adenopathy well-dressed normal affect normal respiratory effort he does have an antalgic gait. Examination is good dorsalis pedis pulse he does have venous stasis swelling in the leg as well as Charcot swelling in the foot there is no redness cellulitis there is no ulceration no callus. ROS: Complete review of systems negative except as mentioned in history of present illness. Imaging: No results found.  Labs: Lab Results  Component Value Date   HGBA1C 6.9 (H) 02/02/2017   HGBA1C 6.2 09/05/2016   HGBA1C 5.6 01/28/2016   ESRSEDRATE 81 (H) 02/17/2016   ESRSEDRATE 71 (H) 04/20/2015   CRP 7.0 (H) 02/17/2016   LABURIC 5.9 12/20/2016   LABURIC 2.4 (L) 07/03/2016   LABURIC 12.9 (H) 06/08/2016   GRAMSTAIN Few 04/20/2015   GRAMSTAIN WBC present-both PMN and  Mononuclear 04/20/2015   GRAMSTAIN No Organisms Seen 04/20/2015   LABORGA ENTEROCOCCUS SPECIES 01/28/2016    Orders:  No orders of the defined types were placed in this encounter.  No orders of the defined types were placed in this encounter.    Procedures: No procedures performed  Clinical Data: No additional findings.  Subjective: Review of Systems  Objective: Vital Signs: Ht 5\' 8"  (1.727 m)   Wt 225 lb (102.1 kg)   BMI 34.21 kg/m   Specialty Comments:  No specialty comments available.  PMFS History: Patient Active Problem List   Diagnosis Date Noted  . Syncope and collapse 02/02/2017  . Charcot gait 02/02/2017  . Charcot's joint 02/02/2017  . Heart failure, diastolic ([place])   . 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 Diabetic polyneuropathy associated with type 2 diabetes mellitus ([place]) 12/25/2016  . Idiopathic chronic venous hypertension of both lower extremities with inflammation 12/25/2016  . Cataract, right 05/18/2016  . Right cervical radiculopathy 05/12/2016  . 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 ([place]) 10/12/2015  . 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 Gout 04/20/2015  . Osteoarthritis of both acromioclavicular joints 12/03/2014  . Left shoulder pain 11/17/2014  . Chronic CHF (congestive heart failure) (  [place]) 10/05/2014  . Unstable angina pectoris ([place]) 10/05/2014  . Acute diastolic heart failure ([place]) 10/05/2014  . History of colonic polyps 01/28/2014  . Bilateral carpal tunnel syndrome 01/09/2014  . Diabetic retinopathy ([place]) 10/31/2013  . Macular edema, diabetic ([place]) 10/31/2013  . Obesity (BMI 30-39.9) 07/31/2013  . Fatigue 03/06/2012  . Astigmatism 01/11/2012  . CAD (coronary artery disease), native coronary artery 05/17/2011  . Nonspecific abnormal unspecified cardiovascular function study 03/01/2011  . Bradycardia  02/08/2011  . Right knee pain 02/08/2011  . Hyperlipidemia   . RBBB 02/01/2011  . Chronic kidney disease, stage 4, severely decreased GFR (HCC) 10/23/2006  . Proteinuria 10/23/2006  . Obstructive sleep apnea 10/05/2006  . Diabetes mellitus with stage 4 chronic kidney disease ([place]) 08/28/2006  . HYPERTENSION, BENIGN SYSTEMIC 08/28/2006   Past Medical History:  Diagnosis Date  . Brittle bones    per pt, has soft bones in right foot/wears boot cast!  . CAD (coronary artery disease)   . Cataract    Bil/ surg scheduled for right eye 01/18/17/ left eye 02/08/17  . CHF (congestive heart failure) ([place]) 2015  . Diabetes mellitus   . Heart failure, diastolic ([place])   . Hyperlipidemia   . Hypertension   . Macular edema 2014  . OSA on CPAP   . Personal history of colonic polyps - adenomas 01/28/2014  . Renal insufficiency   . Shortness of breath dyspnea   . Syncope and collapse     Family History  Problem Relation Age of Onset  . Diabetes Mother   . Kidney disease Mother   . Diabetes Father   . Coronary artery disease Father   . Diabetes Brother   . Diabetes Sister   . Cancer Maternal Uncle   . Diabetes Maternal Grandmother   . Cancer Paternal Grandmother   . Heart attack Paternal Grandfather   . Colon cancer Neg Hx     Past Surgical History:  Procedure Laterality Date  . CARPAL TUNNEL RELEASE     left hand  . COLONOSCOPY    . KNEE ARTHROSCOPY Right 09/13/2016   [facility], Dr. Dorna Leitz  . PILONIDAL CYST EXCISION     Social History   Occupational History  . Warehouse    Social History Main Topics  . Smoking status: Former Smoker    Packs/day: 0.50    Years: 20.00    Types: Cigarettes    Quit date: 03/06/1977  . Smokeless tobacco: Never Used  . Alcohol use No  . Drug use: No  . Sexual activity: No

## 2017-02-02 NOTE — ED Notes (Signed)
Pt transported to xray at this time

## 2017-02-02 NOTE — ED Triage Notes (Signed)
PER [facility]: pt had 2 min unwitnessed syncopal episode, his wife heard pt fall to the ground and found him unconscious on bedroom floor. EMS arrived to find alert, pale, diaphoretic and complaining of HA. EKG done and showed possible inferior wall STEMI. Pt became nauseous on arrival and EMS adm 4mg  of Zofran as well as 324 aspirin. BP- 141/68, HR-52, 98% 4L [place]. Pt denies CP/SOB.

## 2017-02-03 ENCOUNTER — Ambulatory Visit (HOSPITAL_BASED_OUTPATIENT_CLINIC_OR_DEPARTMENT_OTHER): Payer: Medicare Other

## 2017-02-03 DIAGNOSIS — R55 Syncope and collapse: Secondary | ICD-10-CM

## 2017-02-03 LAB — HEMOGLOBIN A1C
Hgb A1c MFr Bld: 6.9 % — ABNORMAL HIGH (ref 4.8–5.6)
Mean Plasma Glucose: 151 mg/dL

## 2017-02-03 LAB — ECHOCARDIOGRAM COMPLETE
CHL CUP DOP CALC LVOT VTI: 35.3 cm
E/e' ratio: 13.09
FS: 46 % — AB (ref 28–44)
Height: 68 in
IVS/LV PW RATIO, ED: 1
LA diam end sys: 50 mm
LA diam index: 2.33 cm/m2
LA vol A4C: 86.4 ml
LA vol index: 32.9 mL/m2
LA vol: 70.7 mL
LASIZE: 50 mm
LV E/e'average: 13.09
LV PW d: 12 mm — AB (ref 0.6–1.1)
LV TDI E'LATERAL: 7.29
LV e' LATERAL: 7.29 cm/s
LVEEMED: 13.09
LVOT area: 2.84 cm2
LVOT diameter: 19 mm
LVOT peak grad rest: 8 mmHg
LVOT peak vel: 142 cm/s
LVOTSV: 100 mL
Lateral S' vel: 12.9 cm/s
MV pk E vel: 95.4 m/s
MVPG: 4 mmHg
MVPKAVEL: 80.5 m/s
RV TAPSE: 23.4 mm
TDI e' medial: 5.55
WEIGHTICAEL: 3600 [oz_av]

## 2017-02-03 LAB — BASIC METABOLIC PANEL
ANION GAP: 12 (ref 5–15)
BUN: 77 mg/dL — ABNORMAL HIGH (ref 6–20)
CO2: 30 mmol/L (ref 22–32)
Calcium: 9.4 mg/dL (ref 8.9–10.3)
Chloride: 96 mmol/L — ABNORMAL LOW (ref 101–111)
Creatinine, Ser: 2.52 mg/dL — ABNORMAL HIGH (ref 0.61–1.24)
GFR calc Af Amer: 28 mL/min — ABNORMAL LOW (ref >60)
GFR calc non Af Amer: 24 mL/min — ABNORMAL LOW (ref >60)
GLUCOSE: 254 mg/dL — AB (ref 65–99)
POTASSIUM: 3.5 mmol/L (ref 3.5–5.1)
Sodium: 138 mmol/L (ref 135–145)

## 2017-02-03 LAB — TROPONIN I: Troponin I: 0.04 ng/mL (ref <0.03)

## 2017-02-03 LAB — GLUCOSE, CAPILLARY
Glucose-Capillary: 230 mg/dL — ABNORMAL HIGH (ref 65–99)
Glucose-Capillary: 221 mg/dL — ABNORMAL HIGH (ref 65–99)

## 2017-02-03 MED ORDER — METOPROLOL SUCCINATE ER 50 MG PO TB24: 50.0000 mg | ORAL_TABLET | Freq: Every day | ORAL | 0 refills | Status: DC

## 2017-02-03 NOTE — Discharge Summary (Signed)
Physician Discharge Summary  Louis [name]:[id number] DOB: [birth year] DOA: 02/02/2017  PCP: [name], MD  Admit date: 02/02/2017 Discharge date: 02/03/2017  Admitted From: Home Disposition:  Home  Recommendations for Outpatient Follow-up:  1. Follow up with PCP in 1 week 2. Follow up with Dr. Stanford Breed in 1 week  3. Please obtain BMP/CBC in 1 week   Home Health: No  Equipment/Devices: None   Discharge Condition: Stable CODE STATUS: Full  Diet recommendation: Heart healthy   Brief/Interim Summary: From H&P: Louis [name] is a very pleasant 71 y.o. male with medical history significant of diabetes, hypertension, chronic kidney disease, diastolic heart failure, CAD,  diabetic retinopathy presents to the emergency department chief complaint of syncope and collapse. Initial evaluation reveals mild bradycardia otherwise unremarkable.  Information is obtained from the patient and his wife who is at the bedside. Patient reports he was in his usual state of health until this morning he got up began his morning routine of brushing his teeth taking his medicines have a bowel movement when after his bowel movement he had a syncopal event. Wife states she heard him fall off the commode and that he made a loud "moaning noise" and then she heard him fall onto the floor. He denies headache dizziness chest pain palpitations shortness of breath diaphoresis nausea vomiting prior to the episode. The wife states she immediately checked his blood sugar; it was 156. She reports she tried to arouse him for "a little while" and he would not wake up. He was breathing and she felt a pulse she called EMS and by the time they arrived he was alert pale diaphoretic and complaining of a headache. He was transported to the emergency department when he arrived he had one episode of non-coffee ground emesis. He denies any abdominal pain constipation diarrhea melena bright red blood per rectum. He denies any  fever chills cough lower extremity edema dysuria hematuria frequency or urgency. He reports he weighs every day and if his weight goes up 2 pounds a 5 day. He'll take an extra Lasix or when necessary zaroxolyn. Wife reports his weight has been steady of late.  Interim: Work up in the ED was largely unremarkable. Cr was stable, troponin minimally elevated at 0.04, CT head negative, CXR unremarkable, EKG without acute change from previous. Orthostatic vital sign was obtained which was normal. Toprol was held due to bradycardia and patient underwent echocardiogram with EF 60-65%, no wall motion abnormalities or aortic stenosis. Patient had no further episodes during hospitalization. Toprol was restarted at a lower dose.    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)   Charcot's joint  Vasovagal syncope -Syncopal episode described during/shortly after bowel movement -CT head without acute abnormalities -Echo showed normal EF, no wall motion abnormalities, no aortic stenosis -Troponin flat 0.04, 0.04  -Orthostatic VS negative   Bradycardia -TSH normal  -Cut toprol dose in half to 50mg  daily  -Follow up with cardiology  HTN -Contine catapres, imdur, hydralazine, lasix, toprol at lower dose -  Stable while in hospital  CAD -No chest pain -Continue aspirin, statin, imdur   Chronic diastolic heart failure -Continue lasix   CKD stage 4 -Cr stable   DM -Ha1c 6.9   Discharge Instructions  Discharge Instructions    Call MD  for:  difficulty breathing, headache or visual disturbances    Complete by:  As directed    Call MD for:  extreme fatigue    Complete by:  As directed    Call MD for:  persistant dizziness or light-headedness    Complete by:  As directed    Diet - low sodium heart healthy    Complete by:  As directed    Increase activity slowly    Complete by:  As directed      Allergies as of 02/03/2017      Reactions   Hydrocodone Nausea And Vomiting   Oxycodone Nausea And Vomiting      Medication List    TAKE these medications   allopurinol 300 MG tablet Commonly known as:  ZYLOPRIM Take 1 tablet (300 mg total) by mouth 2 (two) times daily.   aspirin 81 MG tablet Take 81 mg by mouth daily.   atorvastatin 80 MG tablet Commonly known as:  LIPITOR Take 1 tablet (80 mg total) by mouth daily.   cloNIDine 0.2 MG tablet Commonly known as:  CATAPRES Take 1 tablet (0.2 mg total) by mouth daily.   colchicine 0.6 MG tablet Take 1 tablet (0.6 mg total) by mouth 2 (two) times daily. What changed:  when to take this  reasons to take this   furosemide 80 MG tablet Commonly known as:  LASIX Take 80 mg by mouth 2 (two) times daily.   gabapentin 300 MG capsule Commonly known as:  NEURONTIN Take 1 capsule (300 mg total) by mouth 2 (two) times daily.   hydrALAZINE 100 MG tablet Commonly known as:  APRESOLINE Take 100 mg by mouth 2 (two) times daily.   isosorbide mononitrate 20 MG tablet Commonly known as:  ISMO,MONOKET Take 1 tablet (20 mg total) by mouth 2 (two) times daily at 10 AM and 5 PM.   ketorolac 0.5 % ophthalmic solution Commonly known as:  ACULAR Place 1 drop into the right eye 4 (four) times daily. For 10 days   metolazone 5 MG tablet Commonly known as:  ZAROXOLYN Take 5 mg by mouth daily as needed (swelling).   metoprolol succinate 50 MG 24 hr tablet Commonly known as:  TOPROL-XL Take 1 tablet (50 mg total) by mouth daily. What changed:  medication strength  how  much to take   nitroGLYCERIN 0.4 MG SL tablet Commonly known as:  NITROSTAT Place 1 tablet (0.4 mg total) under the tongue every 5 (five) minutes as needed for chest pain.   NOVOLIN N 100 UNIT/ML injection Generic drug:  insulin NPH Human Inject 50 Units into the skin 2 (two) times daily before a meal. Novolin 50 units twice a day   NOVOLIN R 250 units/2.32mL (100 units/mL) injection Generic drug:  insulin regular Injects 15-20 units TID with meals using a sliding scale   triamcinolone ointment 0.5 % Commonly known as:  KENALOG Apply 1 application topically 2 (two) times daily. To affected area, avoid eyes and face      Follow-up Information    METHENEY,CATHERINE, MD. Schedule an appointment as soon as possible for a visit in 1 week(s).   Specialty:  Family Medicine Contact information: [place]  [place] [address] [phone]        [name], MD. Schedule an appointment as soon as possible for a visit in 1 week(s).   Specialty:  Cardiology Contact information: [address] [place]  [address] [phone]          Allergies  Allergen Reactions  . Hydrocodone Nausea And Vomiting  . Oxycodone Nausea And Vomiting    Consultations:  None    Procedures/Studies: Dg Chest 2 View  Result Date: 02/02/2017 CLINICAL DATA:  Patient with syncopal episode. Headache. Nausea and vomiting. EXAM: CHEST  2 VIEW COMPARISON:  Chest radiograph 10/05/2014. FINDINGS: Cardiomegaly. Tortuosity of the thoracic aorta. No consolidative pulmonary opacities. No pleural effusion or pneumothorax. Mid thoracic spine degenerative changes. IMPRESSION: Cardiomegaly.  No acute cardiopulmonary process. Electronically Signed   By: Lovey Newcomer M.D.   On: 02/02/2017 12:21   Ct Head Wo Contrast  Result Date: 02/02/2017 CLINICAL DATA:  Headache and syncope.  Nausea.  Dizziness. EXAM: CT HEAD WITHOUT CONTRAST TECHNIQUE: Contiguous axial images were  obtained from the base of the skull through the vertex without intravenous contrast. COMPARISON:  None. FINDINGS: Brain: There is moderate diffuse atrophy. There is no intracranial mass, hemorrhage, extra-axial fluid collection, midline shift. There is patchy small vessel disease throughout the centra semiovale bilaterally. There is evidence of a prior infarct in the anterior left corona radiata. There is evidence of a prior small lacunar infarct in each thalamus. There is no acute appearing infarct evident. Vascular: There is no hyperdense vessel. There is calcification in each carotid siphon as well as in the distal left vertebral artery. Skull: The bony calvarium appears intact. Sinuses/Orbits: There is extensive mucosal thickening in the left maxillary antrum. There is opacification in several ethmoid sinuses bilaterally. Visualized paranasal sinuses elsewhere clear. Orbits appear symmetric bilaterally except for prior removal of cataract on the right. Other: Mastoid air cells which are visualized are clear. IMPRESSION: Atrophy with supratentorial small vessel disease. Prior small infarcts as noted above. No acute infarct. No mass, hemorrhage, or extra-axial fluid collection. Foci of arterial vascular calcification noted. Paranasal sinus disease noted most severe in the left maxillary antrum. Electronically Signed   By: Lowella Grip III M.D.   On: 02/02/2017 13:07    Echo Study Conclusions  - Left ventricle: The cavity size was normal. There was moderate   concentric hypertrophy. Systolic function was normal. The   estimated ejection fraction was in the range of 60% to 65%. Wall   motion was normal; there were no regional wall motion   abnormalities. Features are consistent with a pseudonormal left   ventricular filling pattern, with concomitant abnormal relaxation   and increased filling pressure (grade 2 diastolic dysfunction).   Doppler parameters are consistent with high ventricular  filling   pressure. - Aortic valve: Trileaflet; mildly thickened, mildly calcified   leaflets. - Mitral valve: There was mild regurgitation. - Left atrium: Anterior-posterior dimension: 50 mm. - Atrial septum: There was increased thickness of the septum,   consistent with lipomatous hypertrophy. - Pulmonic valve: There was trivial regurgitation. - Pulmonary arteries: Systolic pressure could not be accurately   estimated.   Discharge Exam: Vitals:   02/03/17 0847 02/03/17 1240  BP: (!) 139/53 (!) 127/55  Pulse:    Resp:    Temp: 97.5 F (36.4 C) 97.3 F (36.3 C)   Vitals:   02/02/17 2100 02/03/17 0552 02/03/17 0847 02/03/17 1240  BP:  (!) 141/62 (!) 139/53 (!) 127/55  Pulse: 62 Marland Kitchen)  59    Resp: 15 12    Temp:  97.9 F (36.6 C) 97.5 F (36.4 C) 97.3 F (36.3 C)  TempSrc:  Oral Oral Oral  SpO2: 96% 100%    Weight:      Height:         General: Pt is alert, awake, not in acute distress Cardiovascular: bradycardia, rate 58-60, S1/S2 +, no rubs, no gallops Respiratory: CTA bilaterally, no wheezing, no rhonchi Abdominal: Soft, NT, ND, bowel sounds + Extremities: no edema, no cyanosis    The results of significant diagnostics from this hospitalization (including imaging, microbiology, ancillary and laboratory) are listed below for reference.     Microbiology: No results found for this or any previous visit (from the past 240 hour(s)).   Labs: BNP (last 3 results)  Recent Labs  02/02/17 1353  BNP 494.4*   Basic Metabolic Panel:  Recent Labs Lab 02/02/17 1145 02/02/17 1521 02/03/17 0722  NA 135  --  138  K 3.1*  --  3.5  CL 96*  --  96*  CO2 25  --  30  GLUCOSE 192*  --  254*  BUN 80*  --  77*  CREATININE 2.52*  --  2.52*  CALCIUM 9.5  --  9.4  MG  --  2.0  --   PHOS  --  3.8  --    Liver Function Tests: No results for input(s): AST, ALT, ALKPHOS, BILITOT, PROT, ALBUMIN in the last 168 hours. No results for input(s): LIPASE, AMYLASE in the last  168 hours. No results for input(s): AMMONIA in the last 168 hours. CBC:  Recent Labs Lab 02/02/17 1145  WBC 13.5*  HGB 13.0  HCT 40.6  MCV 91.4  PLT 226   Cardiac Enzymes:  Recent Labs Lab 02/02/17 1521 02/03/17 0722  TROPONINI 0.04* 0.04*   BNP: Invalid input(s): POCBNP CBG:  Recent Labs Lab 02/02/17 1204 02/02/17 1620 02/02/17 2102 02/03/17 0753 02/03/17 1221  GLUCAP 191* 245* 406* 230* 221*   D-Dimer No results for input(s): DDIMER in the last 72 hours. Hgb A1c  Recent Labs  02/02/17 1521  HGBA1C 6.9*   Lipid Profile No results for input(s): CHOL, HDL, LDLCALC, TRIG, CHOLHDL, LDLDIRECT in the last 72 hours. Thyroid function studies  Recent Labs  02/02/17 1521  TSH 2.391   Anemia work up No results for input(s): VITAMINB12, FOLATE, FERRITIN, TIBC, IRON, RETICCTPCT in the last 72 hours. Urinalysis    Component Value Date/Time   COLORURINE YELLOW 02/02/2017 1146   APPEARANCEUR CLEAR 02/02/2017 1146   LABSPEC 1.011 02/02/2017 1146   PHURINE 5.0 02/02/2017 1146   GLUCOSEU NEGATIVE 02/02/2017 1146   HGBUR NEGATIVE 02/02/2017 [place] 02/02/2017 1146   BILIRUBINUR neg 01/28/2016 [place] 02/02/2017 1146   PROTEINUR 100 (A) 02/02/2017 1146   UROBILINOGEN 0.2 01/28/2016 1159   NITRITE NEGATIVE 02/02/2017 1146   LEUKOCYTESUR NEGATIVE 02/02/2017 1146   Sepsis Labs Invalid input(s): PROCALCITONIN,  WBC,  LACTICIDVEN Microbiology No results found for this or any previous visit (from the past 240 hour(s)).   Time coordinating discharge: 45 minutes  SIGNED:  Dessa Phi, DO [facility] Pager [phone]  If 7PM-7AM, please contact night-coverage [email or web address] Password [email or web address] 02/03/2017, 2:03 PM

## 2017-02-03 NOTE — Progress Notes (Signed)
  Echocardiogram 2D Echocardiogram has been performed.  Louis [name] 02/03/2017, 11:25 AM

## 2017-02-03 NOTE — Discharge Instructions (Signed)
Syncope Syncope is when you lose temporarily pass out (faint). Signs that you may be about to pass out include:  Feeling dizzy or light-headed.  Feeling sick to your stomach (nauseous).  Seeing all white or all black.  Having cold, clammy skin. If you passed out, get help right away. Call your local emergency services (911 in the [place] Do not drive yourself to the hospital. Follow these instructions at home: Pay attention to any changes in your symptoms. Take these actions to help with your condition:  Have someone stay with you until you feel stable.  Do not drive, use machinery, or play sports until your doctor says it is okay.  Keep all follow-up visits as told by your doctor. This is important.  If you start to feel like you might pass out, lie down right away and raise (elevate) your feet above the level of your heart. Breathe deeply and steadily. Wait until all of the symptoms are gone.  Drink enough fluid to keep your pee (urine) clear or pale yellow.  If you are taking blood pressure or heart medicine, get up slowly and spend many minutes getting ready to sit and then stand. This can help with dizziness.  Take over-the-counter and prescription medicines only as told by your doctor. Get help right away if:  You have a very bad headache.  You have unusual pain in your chest, tummy, or back.  You are bleeding from your mouth or rectum.  You have black or tarry poop (stool).  You have a very fast or uneven heartbeat (palpitations).  It hurts to breathe.  You pass out once or more than once.  You have jerky movements that you cannot control (seizure).  You are confused.  You have trouble walking.  You are very weak.  You have vision problems. These symptoms may be an emergency. Do not wait to see if the symptoms will go away. Get medical help right away. Call your local emergency services (911 in the [place] Do not drive yourself to the hospital. This  information is not intended to replace advice given to you by your health care provider. Make sure you discuss any questions you have with your health care provider. Document Released: 04/24/2008 Document Revised: 04/13/2016 Document Reviewed: 07/21/2015 Elsevier Interactive Patient Education  2017 [employer].

## 2017-02-05 ENCOUNTER — Ambulatory Visit (INDEPENDENT_AMBULATORY_CARE_PROVIDER_SITE_OTHER): Payer: Self-pay

## 2017-02-05 ENCOUNTER — Encounter (INDEPENDENT_AMBULATORY_CARE_PROVIDER_SITE_OTHER): Payer: Self-pay | Admitting: Orthopedic Surgery

## 2017-02-05 ENCOUNTER — Ambulatory Visit (INDEPENDENT_AMBULATORY_CARE_PROVIDER_SITE_OTHER): Payer: Medicare Other | Admitting: Orthopedic Surgery

## 2017-02-05 VITALS — Ht 68.0 in | Wt 225.0 lb

## 2017-02-05 DIAGNOSIS — E11610 Type 2 diabetes mellitus with diabetic neuropathic arthropathy: Secondary | ICD-10-CM | POA: Diagnosis not present

## 2017-02-05 NOTE — Addendum Note (Signed)
Addended byLaurann Montana on: 02/05/2017 10:03 AM   Modules accepted: Orders

## 2017-02-07 DIAGNOSIS — E0822 Diabetes mellitus due to underlying condition with diabetic chronic kidney disease: Secondary | ICD-10-CM | POA: Diagnosis not present

## 2017-02-07 DIAGNOSIS — N184 Chronic kidney disease, stage 4 (severe): Secondary | ICD-10-CM | POA: Diagnosis not present

## 2017-02-07 DIAGNOSIS — M109 Gout, unspecified: Secondary | ICD-10-CM | POA: Diagnosis not present

## 2017-02-07 DIAGNOSIS — D631 Anemia in chronic kidney disease: Secondary | ICD-10-CM | POA: Diagnosis not present

## 2017-02-07 DIAGNOSIS — E669 Obesity, unspecified: Secondary | ICD-10-CM | POA: Diagnosis not present

## 2017-02-07 DIAGNOSIS — I129 Hypertensive chronic kidney disease with stage 1 through stage 4 chronic kidney disease, or unspecified chronic kidney disease: Secondary | ICD-10-CM | POA: Diagnosis not present

## 2017-02-07 DIAGNOSIS — R809 Proteinuria, unspecified: Secondary | ICD-10-CM | POA: Diagnosis not present

## 2017-02-08 ENCOUNTER — Telehealth: Payer: Self-pay | Admitting: Cardiology

## 2017-02-08 DIAGNOSIS — G4733 Obstructive sleep apnea (adult) (pediatric): Secondary | ICD-10-CM | POA: Diagnosis not present

## 2017-02-08 DIAGNOSIS — E11311 Type 2 diabetes mellitus with unspecified diabetic retinopathy with macular edema: Secondary | ICD-10-CM | POA: Diagnosis not present

## 2017-02-08 DIAGNOSIS — I509 Heart failure, unspecified: Secondary | ICD-10-CM | POA: Diagnosis not present

## 2017-02-08 DIAGNOSIS — E785 Hyperlipidemia, unspecified: Secondary | ICD-10-CM | POA: Diagnosis not present

## 2017-02-08 DIAGNOSIS — I2510 Atherosclerotic heart disease of native coronary artery without angina pectoris: Secondary | ICD-10-CM | POA: Diagnosis not present

## 2017-02-08 DIAGNOSIS — N184 Chronic kidney disease, stage 4 (severe): Secondary | ICD-10-CM | POA: Diagnosis not present

## 2017-02-08 DIAGNOSIS — H25812 Combined forms of age-related cataract, left eye: Secondary | ICD-10-CM | POA: Diagnosis not present

## 2017-02-08 DIAGNOSIS — Z87891 Personal history of nicotine dependence: Secondary | ICD-10-CM | POA: Diagnosis not present

## 2017-02-08 DIAGNOSIS — I130 Hypertensive heart and chronic kidney disease with heart failure and stage 1 through stage 4 chronic kidney disease, or unspecified chronic kidney disease: Secondary | ICD-10-CM | POA: Diagnosis not present

## 2017-02-08 DIAGNOSIS — Z794 Long term (current) use of insulin: Secondary | ICD-10-CM | POA: Diagnosis not present

## 2017-02-08 NOTE — Telephone Encounter (Signed)
Received records from [facility] for appointment on 02/12/17 with Dr Stanford Breed.  Records put with Dr Jacalyn Lefevre schedule for 02/12/17. lp

## 2017-02-09 NOTE — Progress Notes (Signed)
HPI: FU CAD and diastolic CHF. Myoview performed in April 2012 revealed apical ischemia and an ejection fraction of 56%. ABIs were normal. Patient had cardiac catheterization in April of 2012 that revealed 50% distal LM stenosis. LAD - mild diffuse 20% disease proximally. The LAD is occluded in the mid segment after the second diagonal and septal branch. It fills through the very faint collaterals and overall appears to be diffusely diseased. First diagonal is a small-sized branch with a 60% ostial stenosis. Second diagonal is a relatively large size branch with 40-50% proximal disease. The third diagonal appears to have 2 branches and subtotally occluded. LCX with diffuse 40% disease in the mid segment before the takeoff of OM-3. RCA with 30% proximal discrete stenosis. There is also 20% mid stenosis. There is a 30% distal stenosis before the bifurcation. Medical therapy recommended. Echocardiogram March 2018 showed normal LV systolic function, grade 2 diastolic dysfunction, mild mitral regurgitation. Patient admitted March 2018 with syncope. This apparently was felt to be defecation syncope. Toprol decreased due to bradycardia. Since last seen, he has had no recurrent syncope. He denies dyspnea, chest pain or palpitations.  Current Outpatient Prescriptions  Medication Sig Dispense Refill  . allopurinol (ZYLOPRIM) 300 MG tablet Take 1 tablet (300 mg total) by mouth 2 (two) times daily. 180 tablet 3  . aspirin 81 MG tablet Take 81 mg by mouth daily.      Marland Kitchen atorvastatin (LIPITOR) 80 MG tablet Take 1 tablet (80 mg total) by mouth daily. 30 tablet 12  . cloNIDine (CATAPRES) 0.2 MG tablet Take 1 tablet (0.2 mg total) by mouth daily. 30 tablet 11  . colchicine 0.6 MG tablet Take 1 tablet (0.6 mg total) by mouth 2 (two) times daily. (Patient taking differently: Take 0.6 mg by mouth daily as needed (flareups). ) 60 tablet 3  . furosemide (LASIX) 80 MG tablet Take 80 mg by mouth 2 (two) times daily.       Marland Kitchen gabapentin (NEURONTIN) 300 MG capsule Take 1 capsule (300 mg total) by mouth 2 (two) times daily. 180 capsule 3  . hydrALAZINE (APRESOLINE) 100 MG tablet Take 100 mg by mouth 2 (two) times daily.     . isosorbide mononitrate (ISMO,MONOKET) 20 MG tablet Take 1 tablet (20 mg total) by mouth 2 (two) times daily at 10 AM and 5 PM. 180 tablet 3  . ketorolac (ACULAR) 0.5 % ophthalmic solution Place 1 drop into the right eye 4 (four) times daily. For 10 days  2  . metolazone (ZAROXOLYN) 5 MG tablet Take 5 mg by mouth daily as needed (swelling).     . metoprolol succinate (TOPROL-XL) 50 MG 24 hr tablet Take 1 tablet (50 mg total) by mouth daily. 30 tablet 0  . nitroGLYCERIN (NITROSTAT) 0.4 MG SL tablet Place 1 tablet (0.4 mg total) under the tongue every 5 (five) minutes as needed for chest pain. 25 tablet 0  . NOVOLIN N 100 UNIT/ML injection Inject 50 Units into the skin 2 (two) times daily before a meal. Novolin 50 units twice a day    . NOVOLIN R 100 UNIT/ML injection Injects 15-20 units TID with meals using a sliding scale  5  . triamcinolone ointment (KENALOG) 0.5 % Apply 1 application topically 2 (two) times daily. To affected area, avoid eyes and face 30 g 3   Current Facility-Administered Medications  Medication Dose Route Frequency Provider Last Rate Last Dose  . 0.9 %  sodium chloride infusion  500  mL Intravenous Continuous Gatha Mayer, MD         Past Medical History:  Diagnosis Date  . Brittle bones    per pt, has soft bones in right foot/wears boot cast!  . CAD (coronary artery disease)   . Cataract    Bil/ surg scheduled for right eye 01/18/17/ left eye 02/08/17  . CHF (congestive heart failure) ([place]) 2015  . Diabetes mellitus   . Heart failure, diastolic ([place])   . Hyperlipidemia   . Hypertension   . Macular edema 2014  . OSA on CPAP   . Personal history of colonic polyps - adenomas 01/28/2014  . Renal insufficiency   . Shortness of breath dyspnea   . Syncope and collapse      Past Surgical History:  Procedure Laterality Date  . CARPAL TUNNEL RELEASE     left hand  . COLONOSCOPY    . KNEE ARTHROSCOPY Right 09/13/2016   [facility], Dr. Dorna Leitz  . PILONIDAL CYST EXCISION      Social History   Social History  . Marital status: Married    Spouse name: N/A  . Number of children: 5  . Years of education: N/A   Occupational History  . Warehouse    Social History Main Topics  . Smoking status: Former Smoker    Packs/day: 0.50    Years: 20.00    Types: Cigarettes    Quit date: 03/06/1977  . Smokeless tobacco: Never Used  . Alcohol use No  . Drug use: No  . Sexual activity: No   Other Topics Concern  . Not on file   Social History Narrative  . No narrative on file    Family History  Problem Relation Age of Onset  . Diabetes Mother   . Kidney disease Mother   . Diabetes Father   . Coronary artery disease Father   . Diabetes Brother   . Diabetes Sister   . Cancer Maternal Uncle   . Diabetes Maternal Grandmother   . Cancer Paternal Grandmother   . Heart attack Paternal Grandfather   . Colon cancer Neg Hx     ROS: no fevers or chills, productive cough, hemoptysis, dysphasia, odynophagia, melena, hematochezia, dysuria, hematuria, rash, seizure activity, orthopnea, PND, pedal edema, claudication. Remaining systems are negative.  Physical Exam: Well-developed well-nourished in no acute distress.  Skin is warm and dry.  HEENT is normal.  Neck is supple. No bruits Chest is clear to auscultation with normal expansion.  Cardiovascular exam is regular rate and rhythm.  Abdominal exam nontender or distended. No masses palpated. Extremities show no edema. neuro grossly intact  Electrocardiogram 02/02/2017 showed sinus rhythm, inferior infarct, lateral T-wave inversion.  A/P  1 coronary artery disease-continue aspirin and statin.  2 recent syncope-appears to have been defecation syncope. No recurrent episodes.  3 chronic  diastolic congestive heart failure-continue present dose of diuretics. Potassium and renal function monitored by nephrology.  4 hyperlipidemia-continue statin.  5 hypertension-blood pressure controlled. Continue present medications.  [name], MD

## 2017-02-12 ENCOUNTER — Encounter: Payer: Self-pay | Admitting: Cardiology

## 2017-02-12 ENCOUNTER — Ambulatory Visit (INDEPENDENT_AMBULATORY_CARE_PROVIDER_SITE_OTHER): Payer: Medicare Other | Admitting: Cardiology

## 2017-02-12 VITALS — BP 122/60 | HR 60 | Ht 68.0 in | Wt 235.0 lb

## 2017-02-12 DIAGNOSIS — R55 Syncope and collapse: Secondary | ICD-10-CM | POA: Diagnosis not present

## 2017-02-12 DIAGNOSIS — E7800 Pure hypercholesterolemia, unspecified: Secondary | ICD-10-CM | POA: Diagnosis not present

## 2017-02-12 DIAGNOSIS — I2510 Atherosclerotic heart disease of native coronary artery without angina pectoris: Secondary | ICD-10-CM | POA: Diagnosis not present

## 2017-02-12 DIAGNOSIS — I10 Essential (primary) hypertension: Secondary | ICD-10-CM

## 2017-02-12 NOTE — Patient Instructions (Signed)
Your physician recommends that you schedule a follow-up appointment in: [place]  If you need a refill on your cardiac medications before your next appointment, please call your pharmacy.

## 2017-02-13 ENCOUNTER — Telehealth (INDEPENDENT_AMBULATORY_CARE_PROVIDER_SITE_OTHER): Payer: Self-pay | Admitting: *Deleted

## 2017-02-13 ENCOUNTER — Other Ambulatory Visit (INDEPENDENT_AMBULATORY_CARE_PROVIDER_SITE_OTHER): Payer: Self-pay

## 2017-02-13 NOTE — Telephone Encounter (Signed)
Patient's wife called this morning in regards to needing another prescription for her husband to have orthotics from [facility] please. She stated she would be in this morning to pick it up. Thank you

## 2017-02-15 DIAGNOSIS — E113213 Type 2 diabetes mellitus with mild nonproliferative diabetic retinopathy with macular edema, bilateral: Secondary | ICD-10-CM | POA: Diagnosis not present

## 2017-02-22 DIAGNOSIS — Z794 Long term (current) use of insulin: Secondary | ICD-10-CM | POA: Diagnosis not present

## 2017-02-22 DIAGNOSIS — E113213 Type 2 diabetes mellitus with mild nonproliferative diabetic retinopathy with macular edema, bilateral: Secondary | ICD-10-CM | POA: Diagnosis not present

## 2017-03-05 ENCOUNTER — Ambulatory Visit (INDEPENDENT_AMBULATORY_CARE_PROVIDER_SITE_OTHER): Payer: Medicare Other | Admitting: Orthopedic Surgery

## 2017-03-08 ENCOUNTER — Encounter (INDEPENDENT_AMBULATORY_CARE_PROVIDER_SITE_OTHER): Payer: Self-pay | Admitting: Orthopedic Surgery

## 2017-03-08 ENCOUNTER — Ambulatory Visit (INDEPENDENT_AMBULATORY_CARE_PROVIDER_SITE_OTHER): Payer: Medicare Other | Admitting: Orthopedic Surgery

## 2017-03-08 VITALS — Ht 68.0 in | Wt 235.0 lb

## 2017-03-08 DIAGNOSIS — E11610 Type 2 diabetes mellitus with diabetic neuropathic arthropathy: Secondary | ICD-10-CM

## 2017-03-08 DIAGNOSIS — I2510 Atherosclerotic heart disease of native coronary artery without angina pectoris: Secondary | ICD-10-CM

## 2017-03-08 DIAGNOSIS — M1460 Charcot's joint, unspecified site: Secondary | ICD-10-CM | POA: Diagnosis not present

## 2017-03-08 NOTE — Progress Notes (Signed)
Office Visit Note   Patient: Louis [name]           Date of Birth: 12/15/1945           MRN: [id number] Visit Date: 03/08/2017              Requested by: [name], [address], [place] [address] PCP: [name], MD  Chief Complaint  Patient presents with  . Right Foot - Follow-up    4 week ROV charcot collapse      HPI: The patient is a 71 year old gentleman who presents today in follow-up for Charcot collapse right foot. He has had orthotics fabricated for the right foot does have some extra-depth shoes. States that has had his shoe split Right biotech however it continues to be too tight across the dorsum of his foot. He has wife have found some new shoes and are working on getting them covered by his insurance. Today is in 5 compression stockings bilaterally for lower extremity edema.  Assessment & Plan: Visit Diagnoses:  1. Charcot's joint   2. 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 We will proceed with new extra-depth shoe wear. We'll follow-up in office in 6 months. May call or return for any questions or concerns. Daily foot checks.   Follow-Up Instructions: Return in about 6 months (around 09/07/2017).   Ortho Exam  Patient is alert, oriented, no adenopathy, well-dressed, normal affect, normal respiratory effort. Charcot collapse right foot. There is no erythema no warmth no tenderness no plantar ulceration no impending breakdown does have pitting edema despite 2030 compression  Imaging: No results found.  Labs: Lab Results  Component Value Date   HGBA1C 6.9 (H) 02/02/2017   HGBA1C 6.2 09/05/2016   HGBA1C 5.6 01/28/2016   ESRSEDRATE 81 (H) 02/17/2016   ESRSEDRATE 71 (H) 04/20/2015   CRP 7.0 (H) 02/17/2016   LABURIC 5.9 12/20/2016   LABURIC 2.4 (L) 07/03/2016   LABURIC 12.9 (H) 06/08/2016   GRAMSTAIN Few 04/20/2015    GRAMSTAIN WBC present-both PMN and Mononuclear 04/20/2015   GRAMSTAIN No Organisms Seen 04/20/2015   LABORGA ENTEROCOCCUS SPECIES 01/28/2016    Orders:  No orders of the defined types were placed in this encounter.  No orders of the defined types were placed in this encounter.    Procedures: No procedures performed  Clinical Data: No additional findings.  ROS:  All other systems negative, except as noted in the HPI. Review of Systems  Constitutional: Negative for chills and fever.  Cardiovascular: Positive for leg swelling.  Musculoskeletal: Positive for arthralgias.    Objective: Vital Signs: Ht 5\' 8"  (1.727 m)   Wt 235 lb (106.6 kg)   BMI 35.73 kg/m   Specialty Comments:  No specialty comments available.  PMFS History: Patient Active Problem List   Diagnosis Date Noted  . Syncope and collapse 02/02/2017  . Charcot gait 02/02/2017  . Charcot's joint 02/02/2017  . Heart failure, diastolic ([place])   . 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 Diabetic polyneuropathy associated with type 2 diabetes mellitus ([place] [facility]) 12/25/2016  . Idiopathic chronic venous hypertension of both lower extremities with inflammation 12/25/2016  . Cataract, right 05/18/2016  . Right cervical radiculopathy 05/12/2016  . 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 ([place]) 10/12/2015  . 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 Gout 04/20/2015  . Osteoarthritis of both acromioclavicular joints 12/03/2014  . Left shoulder pain 11/17/2014  . Chronic CHF (congestive heart failure) ([facility]) 10/05/2014  . Unstable angina pectoris ([place]) 10/05/2014  . Acute diastolic heart failure ([place] [facility]) 10/05/2014  . History of colonic polyps 01/28/2014  . Bilateral carpal tunnel syndrome 01/09/2014  . Diabetic retinopathy ([place]) 10/31/2013  . Macular edema, diabetic ([place]) 10/31/2013  . Obesity (BMI  30-39.9) 07/31/2013  . Fatigue 03/06/2012  . Astigmatism 01/11/2012  . CAD (coronary artery disease), native coronary artery 05/17/2011  . Nonspecific abnormal unspecified cardiovascular function study 03/01/2011  . Bradycardia 02/08/2011  . Right knee pain 02/08/2011  . Hyperlipidemia   . RBBB 02/01/2011  . Chronic kidney disease, stage 4, severely decreased GFR (HCC) 10/23/2006  . Proteinuria 10/23/2006  . Obstructive sleep apnea 10/05/2006  . Diabetes mellitus with stage 4 chronic kidney disease ([facility]) 08/28/2006  . HYPERTENSION, BENIGN SYSTEMIC 08/28/2006   Past Medical History:  Diagnosis Date  . Brittle bones    per pt, has soft bones in right foot/wears boot cast!  . CAD (coronary artery disease)   . Cataract    Bil/ surg scheduled for right eye 01/18/17/ left eye 02/08/17  . CHF (congestive heart failure) ([facility]) 2015  . Diabetes mellitus   . Heart failure, diastolic ([facility])   . Hyperlipidemia   . Hypertension   . Macular edema 2014  . OSA on CPAP   . Personal history of colonic polyps - adenomas 01/28/2014  . Renal insufficiency   . Shortness of breath dyspnea   . Syncope and collapse     Family History  Problem Relation Age of Onset  . Diabetes Mother   . Kidney disease Mother   . Diabetes Father   . Coronary artery disease Father   . Diabetes Brother   . Diabetes Sister   . Cancer Maternal Uncle   . Diabetes Maternal Grandmother   . Cancer Paternal Grandmother   . Heart attack Paternal Grandfather   . Colon cancer Neg Hx     Past Surgical History:  Procedure Laterality Date  . CARPAL TUNNEL RELEASE     left hand  . COLONOSCOPY    . KNEE ARTHROSCOPY Right 09/13/2016   [facility], Dr. Dorna Leitz  . PILONIDAL CYST EXCISION     Social History   Occupational History  . Warehouse    Social History Main Topics  . Smoking status: Former Smoker    Packs/day: 0.50    Years: 20.00    Types: Cigarettes    Quit date: 03/06/1977  . Smokeless tobacco:  Never Used  . Alcohol use No  . Drug use: No  . Sexual activity: No

## 2017-03-13 DIAGNOSIS — Z794 Long term (current) use of insulin: Secondary | ICD-10-CM | POA: Diagnosis not present

## 2017-03-13 DIAGNOSIS — E113519 Type 2 diabetes mellitus with proliferative diabetic retinopathy with macular edema, unspecified eye: Secondary | ICD-10-CM | POA: Diagnosis not present

## 2017-03-13 DIAGNOSIS — N184 Chronic kidney disease, stage 4 (severe): Secondary | ICD-10-CM | POA: Diagnosis not present

## 2017-03-13 DIAGNOSIS — E11610 Type 2 diabetes mellitus with diabetic neuropathic arthropathy: Secondary | ICD-10-CM | POA: Diagnosis not present

## 2017-03-13 DIAGNOSIS — E1122 Type 2 diabetes mellitus with diabetic chronic kidney disease: Secondary | ICD-10-CM | POA: Diagnosis not present

## 2017-03-13 LAB — HEMOGLOBIN A1C: Hemoglobin A1C: 6.4

## 2017-03-14 ENCOUNTER — Encounter: Payer: Self-pay | Admitting: Cardiology

## 2017-03-14 ENCOUNTER — Ambulatory Visit (INDEPENDENT_AMBULATORY_CARE_PROVIDER_SITE_OTHER): Payer: Medicare Other | Admitting: Cardiology

## 2017-03-14 VITALS — BP 127/62 | HR 55 | Ht 68.0 in | Wt 233.1 lb

## 2017-03-14 DIAGNOSIS — I679 Cerebrovascular disease, unspecified: Secondary | ICD-10-CM

## 2017-03-14 DIAGNOSIS — I2510 Atherosclerotic heart disease of native coronary artery without angina pectoris: Secondary | ICD-10-CM

## 2017-03-14 DIAGNOSIS — I5032 Chronic diastolic (congestive) heart failure: Secondary | ICD-10-CM

## 2017-03-14 NOTE — Patient Instructions (Signed)
Medication Instructions:   NO CHANGE  Testing/Procedures:  Your physician has requested that you have a carotid duplex. This test is an ultrasound of the carotid arteries in your neck. It looks at blood flow through these arteries that supply the brain with blood. Allow one hour for this exam. There are no restrictions or special instructions.    Follow-Up:  Your physician wants you to follow-up in: 6 MONTHS WITH DR CRENSHAW You will receive a reminder letter in the mail two months in advance. If you don't receive a letter, please call our office to schedule the follow-up appointment.   If you need a refill on your cardiac medications before your next appointment, please call your pharmacy.    

## 2017-03-14 NOTE — Progress Notes (Signed)
HPI: FU CAD and diastolic CHF. Myoview performed in April 2012 revealed apical ischemia and an ejection fraction of 56%. ABIs were normal. Patient had cardiac catheterization in April of 2012 that revealed 50% distal LM stenosis. LAD - mild diffuse 20% disease proximally. The LAD is occluded in the mid segment after the second diagonal and septal branch. It fills through the very faint collaterals and overall appears to be diffusely diseased. First diagonal is a small-sized branch with a 60% ostial stenosis. Second diagonal is a relatively large size branch with 40-50% proximal disease. The third diagonal appears to have 2 branches and subtotally occluded. LCX with diffuse 40% disease in the mid segment before the takeoff of OM-3. RCA with 30% proximal discrete stenosis. There is also 20% mid stenosis. There is a 30% distal stenosis before the bifurcation. Medical therapy recommended. Echocardiogram March 2018 showed normal LV systolic function, grade 2 diastolic dysfunction, mild mitral regurgitation. Patient admitted March 2018 with syncope. This apparently was felt to be defecation syncope. Toprol decreased due to bradycardia. Since last seen, patient denies dyspnea, chest pain, palpitations or recurrent syncope.  Current Outpatient Prescriptions  Medication Sig Dispense Refill  . allopurinol (ZYLOPRIM) 300 MG tablet Take 1 tablet (300 mg total) by mouth 2 (two) times daily. 180 tablet 3  . aspirin 81 MG tablet Take 81 mg by mouth daily.      Marland Kitchen atorvastatin (LIPITOR) 80 MG tablet Take 1 tablet (80 mg total) by mouth daily. 30 tablet 12  . B-D INS SYR ULTRAFINE 1CC/31G 31G X 5/16" 1 ML MISC     . cloNIDine (CATAPRES) 0.2 MG tablet Take 1 tablet (0.2 mg total) by mouth daily. 30 tablet 11  . furosemide (LASIX) 80 MG tablet Take 80 mg by mouth 2 (two) times daily.     Marland Kitchen gabapentin (NEURONTIN) 300 MG capsule Take 1 capsule (300 mg total) by mouth 2 (two) times daily. 180 capsule 3  . hydrALAZINE  (APRESOLINE) 100 MG tablet Take 100 mg by mouth 2 (two) times daily.     . isosorbide mononitrate (ISMO,MONOKET) 20 MG tablet Take 1 tablet (20 mg total) by mouth 2 (two) times daily at 10 AM and 5 PM. 180 tablet 3  . ketorolac (ACULAR) 0.5 % ophthalmic solution Place 1 drop into the right eye 4 (four) times daily. For 10 days  2  . metolazone (ZAROXOLYN) 5 MG tablet Take 5 mg by mouth daily as needed (swelling).     . metoprolol succinate (TOPROL-XL) 50 MG 24 hr tablet Take 1 tablet (50 mg total) by mouth daily. 30 tablet 0  . nitroGLYCERIN (NITROSTAT) 0.4 MG SL tablet Place 1 tablet (0.4 mg total) under the tongue every 5 (five) minutes as needed for chest pain. 25 tablet 0  . NOVOLIN N 100 UNIT/ML injection Inject 50 Units into the skin 2 (two) times daily before a meal. Novolin 50 units twice a day    . NOVOLIN R 100 UNIT/ML injection Injects 15-20 units TID with meals using a sliding scale  5  . ONE TOUCH ULTRA TEST test strip USE TO CHECK BLOOD SUGAR 4 TIMES A DAY DX E11.65  1  . prednisoLONE acetate (PRED FORTE) 1 % ophthalmic suspension Place 1 drop into the left eye 3 times daily. QD OD, Qid OS    . triamcinolone ointment (KENALOG) 0.5 % Apply 1 application topically 2 (two) times daily. To affected area, avoid eyes and face 30 g 3  Current Facility-Administered Medications  Medication Dose Route Frequency Provider Last Rate Last Dose  . 0.9 %  sodium chloride infusion  500 mL Intravenous Continuous Gatha Mayer, MD         Past Medical History:  Diagnosis Date  . Brittle bones    per pt, has soft bones in right foot/wears boot cast!  . CAD (coronary artery disease)   . Cataract    Bil/ surg scheduled for right eye 01/18/17/ left eye 02/08/17  . CHF (congestive heart failure) ([place]) 2015  . Diabetes mellitus   . Heart failure, diastolic ([facility])   . Hyperlipidemia   . Hypertension   . Macular edema 2014  . OSA on CPAP   . Personal history of colonic polyps - adenomas 01/28/2014    . Renal insufficiency   . Shortness of breath dyspnea   . Syncope and collapse     Past Surgical History:  Procedure Laterality Date  . CARPAL TUNNEL RELEASE     left hand  . COLONOSCOPY    . KNEE ARTHROSCOPY Right 09/13/2016   [facility], Dr. Dorna Leitz  . PILONIDAL CYST EXCISION      Social History   Social History  . Marital status: Married    Spouse name: N/A  . Number of children: 5  . Years of education: N/A   Occupational History  . Warehouse    Social History Main Topics  . Smoking status: Former Smoker    Packs/day: 0.50    Years: 20.00    Types: Cigarettes    Quit date: 03/06/1977  . Smokeless tobacco: Never Used  . Alcohol use No  . Drug use: No  . Sexual activity: No   Other Topics Concern  . Not on file   Social History Narrative  . No narrative on file    Family History  Problem Relation Age of Onset  . Diabetes Mother   . Kidney disease Mother   . Diabetes Father   . Coronary artery disease Father   . Diabetes Brother   . Diabetes Sister   . Cancer Maternal Uncle   . Diabetes Maternal Grandmother   . Cancer Paternal Grandmother   . Heart attack Paternal Grandfather   . Colon cancer Neg Hx     ROS: no fevers or chills, productive cough, hemoptysis, dysphasia, odynophagia, melena, hematochezia, dysuria, hematuria, rash, seizure activity, orthopnea, PND, pedal edema, claudication. Remaining systems are negative.  Physical Exam: Well-developed obese in no acute distress.  Skin is warm and dry.  HEENT is normal.  Neck is supple. Right carotid bruit Chest is clear to auscultation with normal expansion.  Cardiovascular exam is regular rate and rhythm.  Abdominal exam nontender or distended. No masses palpated. Extremities show no edema. neuro grossly intact    A/P  1 Coronary artery disease-continue aspirin and statin.  2 syncope-he has had no further episodes. Previous episode felt to have been defecation syncope.  3  chronic diastolic congestive heart failure-continue present dose of diuretics. Patient is euvolemic.  4 hyperlipidemia-continue statin.  5 hypertension-blood pressure controlled. Continue present medications.  6 schedule -carotid Dopplers.  [name], MD

## 2017-03-15 ENCOUNTER — Ambulatory Visit (HOSPITAL_BASED_OUTPATIENT_CLINIC_OR_DEPARTMENT_OTHER)
Admission: RE | Admit: 2017-03-15 | Discharge: 2017-03-15 | Disposition: A | Discharge status: Home / Self Care | Payer: Medicare Other | Source: Ambulatory Visit | Attending: Cardiology | Admitting: Cardiology

## 2017-03-15 DIAGNOSIS — I679 Cerebrovascular disease, unspecified: Secondary | ICD-10-CM | POA: Insufficient documentation

## 2017-03-15 DIAGNOSIS — I6523 Occlusion and stenosis of bilateral carotid arteries: Secondary | ICD-10-CM | POA: Diagnosis not present

## 2017-03-16 ENCOUNTER — Other Ambulatory Visit (HOSPITAL_BASED_OUTPATIENT_CLINIC_OR_DEPARTMENT_OTHER): Payer: Medicare Other

## 2017-03-21 ENCOUNTER — Other Ambulatory Visit: Payer: Self-pay | Admitting: Internal Medicine

## 2017-03-29 DIAGNOSIS — E113213 Type 2 diabetes mellitus with mild nonproliferative diabetic retinopathy with macular edema, bilateral: Secondary | ICD-10-CM | POA: Diagnosis not present

## 2017-03-30 ENCOUNTER — Telehealth: Payer: Self-pay | Admitting: Cardiology

## 2017-03-30 NOTE — Telephone Encounter (Signed)
°  New Prob   Calling regarding most recent ultrasound results. Please call.

## 2017-03-30 NOTE — Telephone Encounter (Signed)
Spoke with the patient to inform him of his results. He verbalized his understanding.  Notes recorded by Lelon Perla, MD on 03/16/2017 at 9:03 AM EDT No significant obstruction Louis [name]

## 2017-04-04 DIAGNOSIS — Z0389 Encounter for observation for other suspected diseases and conditions ruled out: Secondary | ICD-10-CM | POA: Diagnosis not present

## 2017-04-05 DIAGNOSIS — E113213 Type 2 diabetes mellitus with mild nonproliferative diabetic retinopathy with macular edema, bilateral: Secondary | ICD-10-CM | POA: Diagnosis not present

## 2017-04-11 DIAGNOSIS — R809 Proteinuria, unspecified: Secondary | ICD-10-CM | POA: Diagnosis not present

## 2017-04-11 DIAGNOSIS — E0822 Diabetes mellitus due to underlying condition with diabetic chronic kidney disease: Secondary | ICD-10-CM | POA: Diagnosis not present

## 2017-04-11 DIAGNOSIS — D631 Anemia in chronic kidney disease: Secondary | ICD-10-CM | POA: Diagnosis not present

## 2017-04-11 DIAGNOSIS — N2581 Secondary hyperparathyroidism of renal origin: Secondary | ICD-10-CM | POA: Diagnosis not present

## 2017-04-11 DIAGNOSIS — N184 Chronic kidney disease, stage 4 (severe): Secondary | ICD-10-CM | POA: Diagnosis not present

## 2017-04-11 DIAGNOSIS — I129 Hypertensive chronic kidney disease with stage 1 through stage 4 chronic kidney disease, or unspecified chronic kidney disease: Secondary | ICD-10-CM | POA: Diagnosis not present

## 2017-04-11 DIAGNOSIS — M109 Gout, unspecified: Secondary | ICD-10-CM | POA: Diagnosis not present

## 2017-04-11 DIAGNOSIS — E669 Obesity, unspecified: Secondary | ICD-10-CM | POA: Diagnosis not present

## 2017-05-01 ENCOUNTER — Telehealth (INDEPENDENT_AMBULATORY_CARE_PROVIDER_SITE_OTHER): Payer: Self-pay | Admitting: Orthopedic Surgery

## 2017-05-01 NOTE — Telephone Encounter (Signed)
Patient's wife [name] ) called asked if her husband can get a ankle brace that will fit in his shoe. [name] said they were able to find some shoes online thru Dr. Gibson Ramp.The number to contact patient is [phone]

## 2017-05-01 NOTE — Telephone Encounter (Signed)
I called and left voicemail advised that we can either give patient ASO lace up brace for ankle.

## 2017-05-03 ENCOUNTER — Telehealth (INDEPENDENT_AMBULATORY_CARE_PROVIDER_SITE_OTHER): Payer: Self-pay | Admitting: Orthopedic Surgery

## 2017-05-03 DIAGNOSIS — E113212 Type 2 diabetes mellitus with mild nonproliferative diabetic retinopathy with macular edema, left eye: Secondary | ICD-10-CM | POA: Diagnosis not present

## 2017-05-03 NOTE — Telephone Encounter (Signed)
Patient request to get fitted for a knee brace.

## 2017-05-04 NOTE — Telephone Encounter (Signed)
I called and left voicemail for patient advised that he needs to be seen in our office for his knee first. Otherwise insurance would most likely not cover. There is nothing I can see. Dr. Sharol Given has seen the office several times for patients foot. Advised they call back and schedule an appointment for knee evaluation, if I am mistaken they can come in the office for a nurse only appointment for knee brace fitting but this too must be a schedule appointment, so we can make sure we have staff to help them.

## 2017-05-10 DIAGNOSIS — E113211 Type 2 diabetes mellitus with mild nonproliferative diabetic retinopathy with macular edema, right eye: Secondary | ICD-10-CM | POA: Diagnosis not present

## 2017-05-11 ENCOUNTER — Ambulatory Visit (INDEPENDENT_AMBULATORY_CARE_PROVIDER_SITE_OTHER): Payer: Medicare Other

## 2017-05-11 ENCOUNTER — Ambulatory Visit (INDEPENDENT_AMBULATORY_CARE_PROVIDER_SITE_OTHER): Payer: Medicare Other | Admitting: Orthopedic Surgery

## 2017-05-11 ENCOUNTER — Encounter (INDEPENDENT_AMBULATORY_CARE_PROVIDER_SITE_OTHER): Payer: Self-pay | Admitting: Orthopedic Surgery

## 2017-05-11 VITALS — Ht 68.0 in | Wt 233.0 lb

## 2017-05-11 DIAGNOSIS — M25571 Pain in right ankle and joints of right foot: Secondary | ICD-10-CM | POA: Diagnosis not present

## 2017-05-11 DIAGNOSIS — I2510 Atherosclerotic heart disease of native coronary artery without angina pectoris: Secondary | ICD-10-CM | POA: Diagnosis not present

## 2017-05-11 DIAGNOSIS — E11610 Type 2 diabetes mellitus with diabetic neuropathic arthropathy: Secondary | ICD-10-CM | POA: Diagnosis not present

## 2017-05-11 NOTE — Progress Notes (Signed)
Office Visit Note   Patient: Louis [name]           Date of Birth: 1946/06/11           MRN: [id number] Visit Date: 05/11/2017              Requested by: [name], [address], [place] [address] PCP: [name], MD  Chief Complaint  Patient presents with  . Right Foot - Pain    Charcot foot  . Right Ankle - Pain      HPI: Patient is a 71 year old gentleman diabetic insensate neuropathy with instability the right ankle. He has a Charcot collapse of the midfoot on the right he states that his ankle continues to roll over and he has episodes of instability due to the lateral ankle laxity.  Assessment & Plan: Visit Diagnoses:  1. Pain in right ankle and joints of right foot   2. 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 He does have custom orthotics for about 2 months old. Discussed that he needs the braces for safe ambulation.  Follow-Up Instructions: Return if symptoms worsen or fail to improve.   Ortho Exam  Patient is alert, oriented, no adenopathy, well-dressed, normal affect, normal respiratory effort. Examination patient has an antalgic gait he is a palpable dorsalis pedis pulses radiograph shows calcification of the blood vessels down to the calcaneus. There is no redness no cellulitis no evidence of any active Charcot process. Patient does have a slight rocker-bottom deformity there is no plantar calluses or ulcers. Patient does have lateral laxity with varus stress.  Imaging: Xr Ankle 2 Views Right  Result Date: 05/11/2017 Two-view radiographs of the right ankle shows a congruent joint there are Charcot arthropathy through the talonavicular and metatarsal joints. There is large osteophytic bone spurs with calcification at the insertion of the Achilles and plantar fascia.   Xr Foot 2  Views Right  Result Date: 05/11/2017 Two-view radiographs of the right foot shows Charcot arthropathy collapse through the midfoot with stable alignment without significant rocker-bottom deformity. There are no fractures through the metatarsals and mid foot Charcot collapse has consolidated well.   Labs: Lab Results  Component Value Date   HGBA1C 6.9 (H) 02/02/2017   HGBA1C 6.2 09/05/2016   HGBA1C 5.6 01/28/2016   ESRSEDRATE 81 (H) 02/17/2016   ESRSEDRATE 71 (H) 04/20/2015   CRP 7.0 (H) 02/17/2016   LABURIC 5.9 12/20/2016   LABURIC 2.4 (L) 07/03/2016   LABURIC 12.9 (H) 06/08/2016   GRAMSTAIN Few 04/20/2015   GRAMSTAIN WBC present-both PMN and Mononuclear 04/20/2015   GRAMSTAIN No Organisms Seen 04/20/2015   LABORGA ENTEROCOCCUS SPECIES 01/28/2016    Orders:  Orders Placed This Encounter  Procedures  . XR Foot 2 Views Right  . XR Ankle 2 Views Right   No orders of the defined types were placed in this encounter.    Procedures: No procedures performed  Clinical Data: No additional findings.  ROS:  All other systems negative, except as noted in the HPI. Review of Systems  Objective: Vital Signs: Ht 5\' 8"  (1.727 m)   Wt 233 lb (105.7 kg)   BMI 35.43 kg/m   Specialty Comments:  No specialty comments available.  PMFS History: Patient Active Problem List   Diagnosis Date Noted  . Syncope and collapse  02/02/2017  . Charcot gait 02/02/2017  . Charcot's joint 02/02/2017  . Heart failure, diastolic ([place])   . 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 Diabetic polyneuropathy associated with type 2 diabetes mellitus ([place]) 12/25/2016  . Idiopathic chronic venous hypertension of both lower extremities with inflammation 12/25/2016  . Cataract, right 05/18/2016  . Right cervical radiculopathy 05/12/2016  . 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 ([facility]) 10/12/2015  .  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 Gout 04/20/2015  . Osteoarthritis of both acromioclavicular joints 12/03/2014  . Left shoulder pain 11/17/2014  . Chronic CHF (congestive heart failure) ([facility] [facility]) 10/05/2014  . Unstable angina pectoris ([place]) 10/05/2014  . Acute diastolic heart failure ([facility]) 10/05/2014  . History of colonic polyps 01/28/2014  . Bilateral carpal tunnel syndrome 01/09/2014  . Diabetic retinopathy ([place]) 10/31/2013  . Macular edema, diabetic ([facility]) 10/31/2013  . Obesity (BMI 30-39.9) 07/31/2013  . Fatigue 03/06/2012  . Astigmatism 01/11/2012  . CAD (coronary artery disease), native coronary artery 05/17/2011  . Nonspecific abnormal unspecified cardiovascular function study 03/01/2011  . Bradycardia 02/08/2011  . Right knee pain 02/08/2011  . Hyperlipidemia   . RBBB 02/01/2011  . Chronic kidney disease, stage 4, severely decreased GFR (HCC) 10/23/2006  . Proteinuria 10/23/2006  . Obstructive sleep apnea 10/05/2006  . Diabetes mellitus with stage 4 chronic kidney disease ([place]) 08/28/2006  . HYPERTENSION, BENIGN SYSTEMIC 08/28/2006   Past Medical History:  Diagnosis Date  . Brittle bones    per pt, has soft bones in right foot/wears boot cast!  . CAD (coronary artery disease)   . Cataract    Bil/ surg scheduled for right eye 01/18/17/ left eye 02/08/17  . CHF (congestive heart failure) ([facility]) 2015  . Diabetes mellitus   . Heart failure, diastolic ([facility])   . Hyperlipidemia   . Hypertension   . Macular edema 2014  . OSA on CPAP   . Personal history of colonic polyps - adenomas 01/28/2014  . Renal insufficiency   . Shortness of breath dyspnea   . Syncope and collapse     Family History  Problem Relation Age of Onset  . Diabetes Mother   . Kidney disease Mother   . Diabetes Father   . Coronary artery disease Father   . Diabetes Brother   . Diabetes Sister   . Cancer Maternal Uncle   . Diabetes Maternal Grandmother   . Cancer Paternal  Grandmother   . Heart attack Paternal Grandfather   . Colon cancer Neg Hx     Past Surgical History:  Procedure Laterality Date  . CARPAL TUNNEL RELEASE     left hand  . COLONOSCOPY    . KNEE ARTHROSCOPY Right 09/13/2016   [facility], Dr. Dorna Leitz  . PILONIDAL CYST EXCISION     Social History   Occupational History  . Warehouse    Social History Main Topics  . Smoking status: Former Smoker    Packs/day: 0.50    Years: 20.00    Types: Cigarettes    Quit date: 03/06/1977  . Smokeless tobacco: Never Used  . Alcohol use No  . Drug use: No  . Sexual activity: No

## 2017-05-16 ENCOUNTER — Ambulatory Visit: Payer: Medicare Other | Admitting: Cardiology

## 2017-05-22 ENCOUNTER — Telehealth (INDEPENDENT_AMBULATORY_CARE_PROVIDER_SITE_OTHER): Payer: Self-pay | Admitting: Orthopedic Surgery

## 2017-05-22 NOTE — Telephone Encounter (Signed)
05/11/2017 OV NOTE FAXED TO Alison Stalling

## 2017-06-01 DIAGNOSIS — H26491 Other secondary cataract, right eye: Secondary | ICD-10-CM | POA: Diagnosis not present

## 2017-06-12 ENCOUNTER — Ambulatory Visit (INDEPENDENT_AMBULATORY_CARE_PROVIDER_SITE_OTHER): Payer: Medicare Other | Admitting: Family Medicine

## 2017-06-12 ENCOUNTER — Encounter: Payer: Self-pay | Admitting: Family Medicine

## 2017-06-12 VITALS — BP 107/52 | HR 45 | Ht 68.0 in | Wt 235.0 lb

## 2017-06-12 DIAGNOSIS — I10 Essential (primary) hypertension: Secondary | ICD-10-CM

## 2017-06-12 DIAGNOSIS — M1000 Idiopathic gout, unspecified site: Secondary | ICD-10-CM

## 2017-06-12 DIAGNOSIS — E1122 Type 2 diabetes mellitus with diabetic chronic kidney disease: Secondary | ICD-10-CM

## 2017-06-12 DIAGNOSIS — K5909 Other constipation: Secondary | ICD-10-CM | POA: Diagnosis not present

## 2017-06-12 DIAGNOSIS — E1142 Type 2 diabetes mellitus with diabetic polyneuropathy: Secondary | ICD-10-CM | POA: Diagnosis not present

## 2017-06-12 DIAGNOSIS — I2510 Atherosclerotic heart disease of native coronary artery without angina pectoris: Secondary | ICD-10-CM | POA: Diagnosis not present

## 2017-06-12 DIAGNOSIS — M1460 Charcot's joint, unspecified site: Secondary | ICD-10-CM

## 2017-06-12 DIAGNOSIS — N184 Chronic kidney disease, stage 4 (severe): Secondary | ICD-10-CM

## 2017-06-12 NOTE — Progress Notes (Signed)
Subjective:    CC: HTN, Gout  HPI: Hypertension- Pt denies chest pain, SOB, dizziness, or heart palpitations.  Taking meds as directed w/o problems.  Denies medication side effects.    Gout - NO recent gout flares.  They are hoping to back off on his medication.  He is on 300mg  BID of allopurinol.   DM- last A1C is 6.4. See endocrinology in [place].  E was also recently diagnosed with Charcot foot on the right. He is now wearing a special shoe with the brace. He does not have any skin breakdown.  He also has some questions about his gabapentin. He read it was actually for seizure disorder and wanted to make sure that he was actually supposed to be on this medication or not.  He also wanted to discuss his chronic constipation. He has used MiraLAX every day but does keep things regulated. He is wondering if it could be from his diabetes. He never had palms with this in the past.  Past medical history, Surgical history, Family history not pertinant except as noted below, Social history, Allergies, and medications have been entered into the medical record, reviewed, and corrections made.   Review of Systems: No fevers, chills, night sweats, weight loss, chest pain, or shortness of breath.   Objective:    General: Well Developed, well nourished, and in no acute distress.  Neuro: Alert and oriented x3, extra-ocular muscles intact, sensation grossly intact.  HEENT: Normocephalic, atraumatic  Skin: Warm and dry, no rashes. Cardiac: Regular rate and rhythm, no murmurs rubs or gallops, no lower extremity edema.  Respiratory: Clear to auscultation bilaterally. Not using accessory muscles, speaking in full sentences.   Impression and Recommendations:    HTN - Well controlled if anything blood pressures actually a little bit low today.  Gout - will recheck uric acid. If it looks good then we can always try decreasing his dose which I would prefer anyway because of his renal function.  Right  Charcot foot-unfortunately has developed discomfort location from his diabetes. Though his diabetes currently is well controlled. He is seeing Dr. Sharol Given for this.  Diabetes-A1c looks fantastic with endocrinology. The follow-up appointment this fall.  Chronic constipation-gave reassurance. Okay to continue to use MiraLAX long-term. He will not become dependent on it and it is safe to use.  Peripheral neuropathy-explained this gabapentin is actually helping with the nerve pain. He is not on it for seizure disorder. We discussed possibly reducing his dose to see if he would do just as well on a lower strength but he wants to continue it for now.

## 2017-06-13 DIAGNOSIS — D631 Anemia in chronic kidney disease: Secondary | ICD-10-CM | POA: Diagnosis not present

## 2017-06-13 DIAGNOSIS — R809 Proteinuria, unspecified: Secondary | ICD-10-CM | POA: Diagnosis not present

## 2017-06-13 DIAGNOSIS — Z6837 Body mass index (BMI) 37.0-37.9, adult: Secondary | ICD-10-CM | POA: Diagnosis not present

## 2017-06-13 DIAGNOSIS — N184 Chronic kidney disease, stage 4 (severe): Secondary | ICD-10-CM | POA: Diagnosis not present

## 2017-06-13 DIAGNOSIS — E669 Obesity, unspecified: Secondary | ICD-10-CM | POA: Diagnosis not present

## 2017-06-13 DIAGNOSIS — E0822 Diabetes mellitus due to underlying condition with diabetic chronic kidney disease: Secondary | ICD-10-CM | POA: Diagnosis not present

## 2017-06-13 DIAGNOSIS — I129 Hypertensive chronic kidney disease with stage 1 through stage 4 chronic kidney disease, or unspecified chronic kidney disease: Secondary | ICD-10-CM | POA: Diagnosis not present

## 2017-06-13 DIAGNOSIS — M109 Gout, unspecified: Secondary | ICD-10-CM | POA: Diagnosis not present

## 2017-06-13 DIAGNOSIS — N2581 Secondary hyperparathyroidism of renal origin: Secondary | ICD-10-CM | POA: Diagnosis not present

## 2017-06-13 LAB — URIC ACID: Uric Acid, Serum: 4.6 mg/dL (ref 4.0–8.0)

## 2017-06-21 DIAGNOSIS — E113213 Type 2 diabetes mellitus with mild nonproliferative diabetic retinopathy with macular edema, bilateral: Secondary | ICD-10-CM | POA: Diagnosis not present

## 2017-06-21 DIAGNOSIS — E113212 Type 2 diabetes mellitus with mild nonproliferative diabetic retinopathy with macular edema, left eye: Secondary | ICD-10-CM | POA: Diagnosis not present

## 2017-06-28 DIAGNOSIS — E113211 Type 2 diabetes mellitus with mild nonproliferative diabetic retinopathy with macular edema, right eye: Secondary | ICD-10-CM | POA: Diagnosis not present

## 2017-07-12 ENCOUNTER — Other Ambulatory Visit: Payer: Self-pay | Admitting: Sports Medicine

## 2017-07-13 DIAGNOSIS — H26492 Other secondary cataract, left eye: Secondary | ICD-10-CM | POA: Diagnosis not present

## 2017-07-16 ENCOUNTER — Other Ambulatory Visit: Payer: Self-pay | Admitting: Cardiology

## 2017-07-16 NOTE — Telephone Encounter (Signed)
Rx request sent to pharmacy.  

## 2017-07-25 ENCOUNTER — Ambulatory Visit: Payer: Medicare Other | Admitting: Family Medicine

## 2017-08-01 ENCOUNTER — Ambulatory Visit: Payer: Medicare Other | Admitting: Family Medicine

## 2017-08-09 DIAGNOSIS — E113213 Type 2 diabetes mellitus with mild nonproliferative diabetic retinopathy with macular edema, bilateral: Secondary | ICD-10-CM | POA: Diagnosis not present

## 2017-08-09 DIAGNOSIS — E113212 Type 2 diabetes mellitus with mild nonproliferative diabetic retinopathy with macular edema, left eye: Secondary | ICD-10-CM | POA: Diagnosis not present

## 2017-08-13 DIAGNOSIS — E669 Obesity, unspecified: Secondary | ICD-10-CM | POA: Diagnosis not present

## 2017-08-13 DIAGNOSIS — Z6837 Body mass index (BMI) 37.0-37.9, adult: Secondary | ICD-10-CM | POA: Diagnosis not present

## 2017-08-13 DIAGNOSIS — D631 Anemia in chronic kidney disease: Secondary | ICD-10-CM | POA: Diagnosis not present

## 2017-08-13 DIAGNOSIS — E0822 Diabetes mellitus due to underlying condition with diabetic chronic kidney disease: Secondary | ICD-10-CM | POA: Diagnosis not present

## 2017-08-13 DIAGNOSIS — M109 Gout, unspecified: Secondary | ICD-10-CM | POA: Diagnosis not present

## 2017-08-13 DIAGNOSIS — N184 Chronic kidney disease, stage 4 (severe): Secondary | ICD-10-CM | POA: Diagnosis not present

## 2017-08-13 DIAGNOSIS — N2581 Secondary hyperparathyroidism of renal origin: Secondary | ICD-10-CM | POA: Diagnosis not present

## 2017-08-13 DIAGNOSIS — R809 Proteinuria, unspecified: Secondary | ICD-10-CM | POA: Diagnosis not present

## 2017-08-13 DIAGNOSIS — I129 Hypertensive chronic kidney disease with stage 1 through stage 4 chronic kidney disease, or unspecified chronic kidney disease: Secondary | ICD-10-CM | POA: Diagnosis not present

## 2017-08-23 DIAGNOSIS — E113211 Type 2 diabetes mellitus with mild nonproliferative diabetic retinopathy with macular edema, right eye: Secondary | ICD-10-CM | POA: Diagnosis not present

## 2017-08-27 DIAGNOSIS — N184 Chronic kidney disease, stage 4 (severe): Secondary | ICD-10-CM | POA: Diagnosis not present

## 2017-09-06 ENCOUNTER — Ambulatory Visit (INDEPENDENT_AMBULATORY_CARE_PROVIDER_SITE_OTHER): Payer: Medicare Other | Admitting: Orthopedic Surgery

## 2017-09-06 DIAGNOSIS — E11610 Type 2 diabetes mellitus with diabetic neuropathic arthropathy: Secondary | ICD-10-CM | POA: Diagnosis not present

## 2017-09-06 DIAGNOSIS — I872 Venous insufficiency (chronic) (peripheral): Secondary | ICD-10-CM

## 2017-09-06 DIAGNOSIS — I2510 Atherosclerotic heart disease of native coronary artery without angina pectoris: Secondary | ICD-10-CM

## 2017-09-06 NOTE — Progress Notes (Signed)
Office Visit Note   Patient: Louis [name]           Date of Birth: [birth year]           MRN: [id number] Visit Date: 09/06/2017              Requested by: [name], [address] [place], [place] [id number] PCP: [name], MD  No chief complaint on file.     HPI: Patient is a 71 year old gentleman diabetic insensate neuropathy Charcot collapse in the right with extra-depth shoes custom orthotics and a double upright brace.patient is concern for possible Charcot complications.  Assessment & Plan: Visit Diagnoses:  1. 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 Venous insufficiency (chronic) (peripheral)     Plan: patient will continue with his diabetic shoe wear continue with the brace.  Follow-Up Instructions: Return in about 6 months (around 03/07/2018).   Ortho Exam  Patient is alert, oriented, no adenopathy, well-dressed, normal affect, normal respiratory effort. Examination patient has a stable Charcot rocker-bottom deformity to the right foot there is no callus no ulcers no blisters no redness no tenderness to palpation there is no signs of any active Charcot arthropathy. His foot is stable. He does have brawny skin color changes with venous edema in the right lower extremity is currently wearing 15-20 mm compression stockings. There are no open ulcers there is no drainage. His foot is plantar grade.  Imaging: No results found. No images are attached to the encounter.  Labs: Lab Results  Component Value Date   HGBA1C 6.4 03/13/2017   HGBA1C 6.9 (H) 02/02/2017   HGBA1C 6.2 09/05/2016   ESRSEDRATE 81 (H) 02/17/2016   ESRSEDRATE 71 (H) 04/20/2015   CRP 7.0 (H) 02/17/2016   LABURIC 4.6 06/12/2017   LABURIC 5.9 12/20/2016   LABURIC 2.4 (L) 07/03/2016   GRAMSTAIN Few 04/20/2015   GRAMSTAIN WBC present-both PMN and Mononuclear 04/20/2015   GRAMSTAIN No Organisms Seen 04/20/2015   LABORGA  ENTEROCOCCUS SPECIES 01/28/2016    Orders:  No orders of the defined types were placed in this encounter.  No orders of the defined types were placed in this encounter.    Procedures: No procedures performed  Clinical Data: No additional findings.  ROS:  All other systems negative, except as noted in the HPI. Review of Systems  Objective: Vital Signs: There were no vitals taken for this visit.  Specialty Comments:  No specialty comments available.  PMFS History: Patient Active Problem List   Diagnosis Date Noted  . Venous insufficiency (chronic) (peripheral) 09/06/2017  . Diabetic peripheral neuropathy ([place]) 06/12/2017  . Syncope and collapse 02/02/2017  . Charcot gait 02/02/2017  . Charcot's joint 02/02/2017  . Heart failure, diastolic ([place])   . 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 Diabetic polyneuropathy associated with type 2 diabetes mellitus ([place]) 12/25/2016  . Idiopathic chronic venous hypertension of both lower extremities with inflammation 12/25/2016  . Cataract, right 05/18/2016  . Right cervical radiculopathy 05/12/2016  . 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 ([place]) 10/12/2015  . 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 Gout 04/20/2015  . Osteoarthritis of both acromioclavicular joints 12/03/2014  . Left shoulder pain 11/17/2014  . Chronic CHF (congestive heart failure) ([place]) 10/05/2014  . Unstable angina pectoris ([place]) 10/05/2014  . Acute diastolic heart failure ([place]) 10/05/2014  .  History of colonic polyps 01/28/2014  . Bilateral carpal tunnel syndrome 01/09/2014  . Diabetic retinopathy ([place]) 10/31/2013  . Macular edema, diabetic ([place]) 10/31/2013  . Obesity (BMI 30-39.9) 07/31/2013  . Fatigue 03/06/2012  . Astigmatism 01/11/2012  . CAD (coronary artery disease), native coronary artery 05/17/2011  . Nonspecific abnormal  unspecified cardiovascular function study 03/01/2011  . Bradycardia 02/08/2011  . Right knee pain 02/08/2011  . Hyperlipidemia   . RBBB 02/01/2011  . Chronic kidney disease, stage 4, severely decreased GFR (HCC) 10/23/2006  . Proteinuria 10/23/2006  . Obstructive sleep apnea 10/05/2006  . Diabetes mellitus with stage 4 chronic kidney disease ([place]) 08/28/2006  . HYPERTENSION, BENIGN SYSTEMIC 08/28/2006   Past Medical History:  Diagnosis Date  . Brittle bones    per pt, has soft bones in right foot/wears boot cast!  . CAD (coronary artery disease)   . Cataract    Bil/ surg scheduled for right eye 01/18/17/ left eye 02/08/17  . CHF (congestive heart failure) ([place]) 2015  . Diabetes mellitus   . Heart failure, diastolic ([place])   . Hyperlipidemia   . Hypertension   . Macular edema 2014  . OSA on CPAP   . Personal history of colonic polyps - adenomas 01/28/2014  . Renal insufficiency   . Shortness of breath dyspnea   . Syncope and collapse     Family History  Problem Relation Age of Onset  . Diabetes Mother   . Kidney disease Mother   . Diabetes Father   . Coronary artery disease Father   . Diabetes Brother   . Diabetes Sister   . Cancer Maternal Uncle   . Diabetes Maternal Grandmother   . Cancer Paternal Grandmother   . Heart attack Paternal Grandfather   . Colon cancer Neg Hx     Past Surgical History:  Procedure Laterality Date  . CARPAL TUNNEL RELEASE     left hand  . COLONOSCOPY    . KNEE ARTHROSCOPY Right 09/13/2016   [facility], Dr. Dorna Leitz  . PILONIDAL CYST EXCISION     Social History   Occupational History  . Warehouse    Social History Main Topics  . Smoking status: Former Smoker    Packs/day: 0.50    Years: 20.00    Types: Cigarettes    Quit date: 03/06/1977  . Smokeless tobacco: Never Used  . Alcohol use No  . Drug use: No  . Sexual activity: No

## 2017-09-13 DIAGNOSIS — E1165 Type 2 diabetes mellitus with hyperglycemia: Secondary | ICD-10-CM | POA: Diagnosis not present

## 2017-09-13 DIAGNOSIS — Z794 Long term (current) use of insulin: Secondary | ICD-10-CM | POA: Diagnosis not present

## 2017-09-13 DIAGNOSIS — I10 Essential (primary) hypertension: Secondary | ICD-10-CM | POA: Diagnosis not present

## 2017-09-13 DIAGNOSIS — N183 Chronic kidney disease, stage 3 (moderate): Secondary | ICD-10-CM | POA: Diagnosis not present

## 2017-09-13 DIAGNOSIS — E113519 Type 2 diabetes mellitus with proliferative diabetic retinopathy with macular edema, unspecified eye: Secondary | ICD-10-CM | POA: Diagnosis not present

## 2017-09-13 DIAGNOSIS — E11610 Type 2 diabetes mellitus with diabetic neuropathic arthropathy: Secondary | ICD-10-CM | POA: Diagnosis not present

## 2017-09-13 DIAGNOSIS — E1122 Type 2 diabetes mellitus with diabetic chronic kidney disease: Secondary | ICD-10-CM | POA: Diagnosis not present

## 2017-09-20 DIAGNOSIS — E113212 Type 2 diabetes mellitus with mild nonproliferative diabetic retinopathy with macular edema, left eye: Secondary | ICD-10-CM | POA: Diagnosis not present

## 2017-09-20 DIAGNOSIS — E113211 Type 2 diabetes mellitus with mild nonproliferative diabetic retinopathy with macular edema, right eye: Secondary | ICD-10-CM | POA: Diagnosis not present

## 2017-09-25 DIAGNOSIS — D631 Anemia in chronic kidney disease: Secondary | ICD-10-CM | POA: Diagnosis not present

## 2017-09-25 DIAGNOSIS — I129 Hypertensive chronic kidney disease with stage 1 through stage 4 chronic kidney disease, or unspecified chronic kidney disease: Secondary | ICD-10-CM | POA: Diagnosis not present

## 2017-09-25 DIAGNOSIS — N2581 Secondary hyperparathyroidism of renal origin: Secondary | ICD-10-CM | POA: Diagnosis not present

## 2017-09-25 DIAGNOSIS — N184 Chronic kidney disease, stage 4 (severe): Secondary | ICD-10-CM | POA: Diagnosis not present

## 2017-09-25 DIAGNOSIS — E0822 Diabetes mellitus due to underlying condition with diabetic chronic kidney disease: Secondary | ICD-10-CM | POA: Diagnosis not present

## 2017-09-25 DIAGNOSIS — E669 Obesity, unspecified: Secondary | ICD-10-CM | POA: Diagnosis not present

## 2017-09-25 DIAGNOSIS — M109 Gout, unspecified: Secondary | ICD-10-CM | POA: Diagnosis not present

## 2017-09-25 DIAGNOSIS — Z6837 Body mass index (BMI) 37.0-37.9, adult: Secondary | ICD-10-CM | POA: Diagnosis not present

## 2017-09-25 DIAGNOSIS — R809 Proteinuria, unspecified: Secondary | ICD-10-CM | POA: Diagnosis not present

## 2017-10-25 DIAGNOSIS — E113213 Type 2 diabetes mellitus with mild nonproliferative diabetic retinopathy with macular edema, bilateral: Secondary | ICD-10-CM | POA: Diagnosis not present

## 2017-10-25 DIAGNOSIS — E113212 Type 2 diabetes mellitus with mild nonproliferative diabetic retinopathy with macular edema, left eye: Secondary | ICD-10-CM | POA: Diagnosis not present

## 2017-11-01 DIAGNOSIS — E113211 Type 2 diabetes mellitus with mild nonproliferative diabetic retinopathy with macular edema, right eye: Secondary | ICD-10-CM | POA: Diagnosis not present

## 2017-11-16 ENCOUNTER — Other Ambulatory Visit: Payer: Self-pay | Admitting: Family Medicine

## 2017-11-16 DIAGNOSIS — Z1159 Encounter for screening for other viral diseases: Secondary | ICD-10-CM

## 2017-11-16 NOTE — Progress Notes (Signed)
Pt is beginning the process to be placed on the kidney transplant list. They need proof that he has gotten the Hep B shots, or he will need to start the series. Lab order placed. Pt advised we would contact him with results and next steps.

## 2017-11-17 LAB — HEPATITIS B SURFACE ANTIBODY,QUALITATIVE: Hep B S Ab: NONREACTIVE

## 2017-11-20 DIAGNOSIS — M14671 Charcot's joint, right ankle and foot: Secondary | ICD-10-CM

## 2017-11-20 HISTORY — DX: Charcot's joint, right ankle and foot: M14.671

## 2017-11-21 ENCOUNTER — Ambulatory Visit (INDEPENDENT_AMBULATORY_CARE_PROVIDER_SITE_OTHER): Payer: Medicare Other | Admitting: Family Medicine

## 2017-11-21 VITALS — BP 125/57 | HR 57 | Temp 98.3°F | Resp 16 | Wt 237.0 lb

## 2017-11-21 DIAGNOSIS — Z7682 Awaiting organ transplant status: Secondary | ICD-10-CM | POA: Diagnosis not present

## 2017-11-21 DIAGNOSIS — N184 Chronic kidney disease, stage 4 (severe): Secondary | ICD-10-CM

## 2017-11-21 DIAGNOSIS — Z23 Encounter for immunization: Secondary | ICD-10-CM | POA: Diagnosis not present

## 2017-11-21 NOTE — Progress Notes (Signed)
Agree with documentation as above.   Catherine Metheney, MD  

## 2017-11-21 NOTE — Progress Notes (Signed)
Pt is here for his [employer] Hepatitis A/B immunization.   Pt tolerated vaccination well. Administered vaccine: Left deltoid  Pt advised next vaccination: 12/22/17

## 2017-11-21 NOTE — Patient Instructions (Signed)
Hepatitis B Vaccine: What You Need to Know  1. Why get vaccinated? Hepatitis B is a serious disease that affects the liver. It is caused by the hepatitis B virus. Hepatitis B can cause mild illness lasting a few weeks, or it can lead to a serious, lifelong illness. Hepatitis B virus infection can be either acute or chronic. Acute hepatitis B virus infection is a short-term illness that occurs within the first 6 months after someone is exposed to the hepatitis B virus. This can lead to:  fever, fatigue, loss of appetite, nausea, and/or vomiting  jaundice (yellow skin or eyes, dark urine, clay-colored bowel movements)  pain in muscles, joints, and stomach  Chronic hepatitis B virus infection is a long-term illness that occurs when the hepatitis B virus remains in a person's body. Most people who go on to develop chronic hepatitis B do not have symptoms, but it is still very serious and can lead to:  liver damage (cirrhosis)  liver cancer  death  Chronically-infected people can spread hepatitis B virus to others, even if they do not feel or look sick themselves. Up to 1.4 million people in the [place] may have chronic hepatitis B infection. About 90% of infants who get hepatitis B become chronically infected and about 1 out of 4 of them dies. Hepatitis B is spread when blood, semen, or other body fluid infected with the Hepatitis B virus enters the body of a person who is not infected. People can become infected with the virus through:  Birth (a baby whose mother is infected can be infected at or after birth)  Sharing items such as razors or toothbrushes with an infected person  Contact with the blood or open sores of an infected person  Sex with an infected partner  Sharing needles, syringes, or other drug-injection equipment  Exposure to blood from needlesticks or other sharp instruments  Each year about 2,000 people in the [place] die from hepatitis B-related liver  disease. Hepatitis B vaccine can prevent hepatitis B and its consequences, including liver cancer and cirrhosis. 2. Hepatitis B vaccine Hepatitis B vaccine is made from parts of the hepatitis B virus. It cannot cause hepatitis B infection. The vaccine is usually given as 3 or 4 shots over a 64-month period. Infants should get their first dose of hepatitis B vaccine at birth and will usually complete the series at 88 months of age. All children and adolescents younger than 73 years of age who have not yet gotten the vaccine should also be vaccinated. Hepatitis B vaccine is recommended for unvaccinated adults who are at risk for hepatitis B virus infection, including:  People whose sex partners have hepatitis B  Sexually active persons who are not in a long-term monogamous relationship  Persons seeking evaluation or treatment for a sexually transmitted disease  Men who have sexual contact with other men  People who share needles, syringes, or other drug-injection equipment  People who have household contact with someone infected with the hepatitis B virus  Health care and public safety workers at risk for exposure to blood or body fluids  Residents and staff of facilities for developmentally disabled persons  Persons in correctional facilities  Victims of sexual assault or abuse  Travelers to regions with increased rates of hepatitis B  People with chronic liver disease, kidney disease, HIV infection, or diabetes  Anyone who wants to be protected from hepatitis B  There are no known risks to getting hepatitis B vaccine at  the same time as other vaccines. 3. Some people should not get this vaccine Tell the person who is giving the vaccine:  If the person getting the vaccine has any severe, life-threatening allergies.  If you ever had a life-threatening allergic reaction after a dose of hepatitis B vaccine, or have a severe allergy to any part of this vaccine, you may be advised not  to get vaccinated. Ask your health care provider if you want information about vaccine components.  If the person getting the vaccine is not feeling well.  If you have a mild illness, such as a cold, you can probably get the vaccine today. If you are moderately or severely ill, you should probably wait until you recover. Your doctor can advise you.  4. Risks of a vaccine reaction With any medicine, including vaccines, there is a chance of side effects. These are usually mild and go away on their own, but serious reactions are also possible. Most people who get hepatitis B vaccine do not have any problems with it. Minor problems following hepatitis B vaccine include:  soreness where the shot was given  temperature of 99.61F or higher  If these problems occur, they usually begin soon after the shot and last 1 or 2 days. Your doctor can tell you more about these reactions. Other problems that could happen after this vaccine:  People sometimes faint after a medical procedure, including vaccination. Sitting or lying down for about 15 minutes can help prevent fainting and injuries caused by a fall. Tell your provider if you feel dizzy, or have vision changes or ringing in the ears.  Some people get shoulder pain that can be more severe and longer-lasting than the more routine soreness that can follow injections. This happens very rarely.  Any medication can cause a severe allergic reaction. Such reactions from a vaccine are very rare, estimated at about 1 in a million doses, and would happen within a few minutes to a few hours after the vaccination. As with any medicine, there is a very remote chance of a vaccine causing a serious injury or death. The safety of vaccines is always being monitored. For more information, visit: http://www.aguilar.org/ 5. What if there is a serious problem? What should I look for? Look for anything that concerns you, such as signs of a severe allergic reaction,  very high fever, or unusual behavior. Signs of a severe allergic reaction can include hives, swelling of the face and throat, difficulty breathing, a fast heartbeat, dizziness, and weakness. These would start a few minutes to a few hours after the vaccination. What should I do?  If you think it is a severe allergic reaction or other emergency that can't wait, call 9-1-1 or get to the nearest hospital. Otherwise, call your clinic.  Afterward, the reaction should be reported to the Vaccine Adverse Event Reporting System (VAERS). Your doctor should file this report, or you can do it yourself through the VAERS web site at www.vaers.SamedayNews.es, or by calling 8562312205. ? VAERS does not give medical advice. 6. The National Vaccine Injury Compensation Program The [employer] Vaccine Injury Compensation Program (VICP) is a federal program that was created to compensate people who may have been injured by certain vaccines. Persons who believe they may have been injured by a vaccine can learn about the program and about filing a claim by calling 951-509-0120 or visiting the [place] website at GoldCloset.com.ee. There is a time limit to file a claim for compensation. 7. How can I  learn more?  Ask your healthcare provider. He or she can give you the vaccine package insert or suggest other sources of information.  Call your local or state health department.  Contact the Centers for Disease Control and Prevention (CDC): ? Call 4695567081 (1-800-CDC-INFO) or ? Visit CDC's website at http://hunter.com/ CDC Hepatitis B VIS (06/09/2015) This information is not intended to replace advice given to you by your health care provider. Make sure you discuss any questions you have with your health care provider. Document Released: 08/31/2006 Document Revised: 07/27/2016 Document Reviewed: 07/27/2016 Elsevier Interactive Patient Education  2017 [place].                                                                         Hepatitis A Vaccine: What You Need to Know  1. Why get vaccinated? Hepatitis A is a serious liver disease. It is caused by the hepatitis A virus (HAV). HAV is spread from person to person through contact with the feces (stool) of people who are infected, which can easily happen if someone does not wash his or her hands properly. You can also get hepatitis A from food, water, or objects contaminated with HAV. Symptoms of hepatitis A can include:  fever, fatigue, loss of appetite, nausea, vomiting, and/or joint pain  severe stomach pains and diarrhea (mainly in children), or  jaundice (yellow skin or eyes, dark urine, clay-colored bowel movements).  These symptoms usually appear 2 to 6 weeks after exposure and usually last less than 2 months, although some people can be ill for as long as 6 months. If you have hepatitis A you may be too ill to work. Children often do not have symptoms, but most adults do. You can spread HAV without having symptoms. Hepatitis A can cause liver failure and death, although this is rare and occurs more commonly in persons 52 years of age or older and persons with other liver diseases, such as hepatitis B or C. Hepatitis A vaccine can prevent hepatitis A. Hepatitis A vaccines were recommended in the [place] beginning in 1996. Since then, the number of cases reported each year in the [place] has dropped from around 31,000 cases to fewer than 1,500 cases. 2. Hepatitis A vaccine Hepatitis A vaccine is an inactivated (killed) vaccine. You will need 2 doses for long-lasting protection. These doses should be given at least 6 months apart. Children are routinely vaccinated between their first and second birthdays (2 through 23 months of age). Older children and adolescents can get the vaccine after 23 months. Adults who have not been vaccinated previously and want to be protected against hepatitis A can also get the vaccine. You should get  hepatitis A vaccine if you:  are traveling to countries where hepatitis A is common,  are a man who has sex with other men,  use illegal drugs,  have a chronic liver disease such as hepatitis B or hepatitis C,  are being treated with clotting-factor concentrates,  work with hepatitis A-infected animals or in a hepatitis A research laboratory, or  expect to have close personal contact with an international adoptee from a country where hepatitis A is common  Ask your healthcare provider if you want more information about any  of these groups. There are no known risks to getting hepatitis A vaccine at the same time as other vaccines. 3. Some people should not get this vaccine Tell the person who is giving you the vaccine:  If you have any severe, life-threatening allergies. If you ever had a life-threatening allergic reaction after a dose of hepatitis A vaccine, or have a severe allergy to any part of this vaccine, you may be advised not to get vaccinated. Ask your health care provider if you want information about vaccine components.  If you are not feeling well. If you have a mild illness, such as a cold, you can probably get the vaccine today. If you are moderately or severely ill, you should probably wait until you recover. Your doctor can advise you.  4. Risks of a vaccine reaction With any medicine, including vaccines, there is a chance of side effects. These are usually mild and go away on their own, but serious reactions are also possible. Most people who get hepatitis A vaccine do not have any problems with it. Minor problems following hepatitis A vaccine include:  soreness or redness where the shot was given  low-grade fever  headache  tiredness  If these problems occur, they usually begin soon after the shot and last 1 or 2 days. Your doctor can tell you more about these reactions. Other problems that could happen after this vaccine:  People sometimes faint after a  medical procedure, including vaccination. Sitting or lying down for about 15 minutes can help prevent fainting, and injuries caused by a fall. Tell your provider if you feel dizzy, or have vision changes or ringing in the ears.  Some people get shoulder pain that can be more severe and longer lasting than the more routine soreness that can follow injections. This happens very rarely.  Any medication can cause a severe allergic reaction. Such reactions from a vaccine are very rare, estimated at about 1 in a million doses, and would happen within a few minutes to a few hours after the vaccination. As with any medicine, there is a very remote chance of a vaccine causing a serious injury or death. The safety of vaccines is always being monitored. For more information, visit: http://www.aguilar.org/ 5. What if there is a serious problem? What should I look for? Look for anything that concerns you, such as signs of a severe allergic reaction, very high fever, or unusual behavior. Signs of a severe allergic reaction can include hives, swelling of the face and throat, difficulty breathing, a fast heartbeat, dizziness, and weakness. These would start a few minutes to a few hours after the vaccination. What should I do?  If you think it is a severe allergic reaction or other emergency that can't wait, call 9-1-1 or get to the nearest hospital. Otherwise, call your clinic.  Afterward, the reaction should be reported to the Vaccine Adverse Event Reporting System (VAERS). Your doctor should file this report, or you can do it yourself through the VAERS web site at www.vaers.SamedayNews.es, or by calling 818 836 0490. ? VAERS does not give medical advice. 6. The National Vaccine Injury Compensation Program The [employer] Vaccine Injury Compensation Program (VICP) is a federal program that was created to compensate people who may have been injured by certain vaccines. Persons who believe they may have been injured by  a vaccine can learn about the program and about filing a claim by calling 219-708-3386 or visiting the [place] website at GoldCloset.com.ee. There is a time limit to  file a claim for compensation. 7. How can I learn more?  Ask your healthcare provider. He or she can give you the vaccine package insert or suggest other sources of information.  Call your local or state health department.  Contact the Centers for Disease Control and Prevention (CDC): ? Call (580) 498-0962 (1-800-CDC-INFO) or ? Visit CDC's website at http://hunter.com/ CDC Hepatitis A Vaccine VIS (06/09/2015) This information is not intended to replace advice given to you by your health care provider. Make sure you discuss any questions you have with your health care provider. Document Released: 08/31/2006 Document Revised: 07/27/2016 Document Reviewed: 07/27/2016 Elsevier Interactive Patient Education  2017 [employer].

## 2017-11-27 DIAGNOSIS — Z01818 Encounter for other preprocedural examination: Secondary | ICD-10-CM | POA: Insufficient documentation

## 2017-11-27 LAB — BASIC METABOLIC PANEL
BUN: 105 — AB (ref 4–21)
CO2: 26
Calcium: 10
Chloride: 95
Creatinine: 3 — AB (ref 0.6–1.3)
GLUCOSE: 48
PHOSPHORUS: 4.6 (ref 2.5–4.9)
POTASSIUM: 2.9 — AB (ref 3.4–5.3)
Sodium: 137 (ref 137–147)
URIC ACID: 6.5

## 2017-11-27 LAB — CBC AND DIFFERENTIAL
HCT: 40 — AB (ref 41–53)
Hemoglobin: 12.9 — AB (ref 13.5–17.5)
NEUTROS ABS: 6
PLATELETS: 216 (ref 150–399)
WBC: 8.3

## 2017-11-27 LAB — HEPATIC FUNCTION PANEL
ALT: 14 (ref 10–40)
AST: 25 (ref 14–40)
Alkaline Phosphatase: 98 (ref 25–125)
Bilirubin, Total: 1

## 2017-11-27 LAB — LIPID PANEL
CHOLESTEROL: 116 (ref 0–200)
HDL: 31 — AB (ref 35–70)
LDL Cholesterol: 66
Triglycerides: 135 (ref 40–160)

## 2017-11-27 LAB — HEMOGLOBIN A1C: Hemoglobin A1C: 6.5

## 2017-12-06 DIAGNOSIS — E113212 Type 2 diabetes mellitus with mild nonproliferative diabetic retinopathy with macular edema, left eye: Secondary | ICD-10-CM | POA: Diagnosis not present

## 2017-12-06 DIAGNOSIS — E113211 Type 2 diabetes mellitus with mild nonproliferative diabetic retinopathy with macular edema, right eye: Secondary | ICD-10-CM | POA: Diagnosis not present

## 2017-12-06 DIAGNOSIS — Z794 Long term (current) use of insulin: Secondary | ICD-10-CM | POA: Diagnosis not present

## 2017-12-07 DIAGNOSIS — E0822 Diabetes mellitus due to underlying condition with diabetic chronic kidney disease: Secondary | ICD-10-CM | POA: Diagnosis not present

## 2017-12-07 DIAGNOSIS — M109 Gout, unspecified: Secondary | ICD-10-CM | POA: Diagnosis not present

## 2017-12-07 DIAGNOSIS — R809 Proteinuria, unspecified: Secondary | ICD-10-CM | POA: Diagnosis not present

## 2017-12-07 DIAGNOSIS — N184 Chronic kidney disease, stage 4 (severe): Secondary | ICD-10-CM | POA: Diagnosis not present

## 2017-12-07 DIAGNOSIS — D631 Anemia in chronic kidney disease: Secondary | ICD-10-CM | POA: Diagnosis not present

## 2017-12-07 DIAGNOSIS — I129 Hypertensive chronic kidney disease with stage 1 through stage 4 chronic kidney disease, or unspecified chronic kidney disease: Secondary | ICD-10-CM | POA: Diagnosis not present

## 2017-12-07 DIAGNOSIS — N2581 Secondary hyperparathyroidism of renal origin: Secondary | ICD-10-CM | POA: Diagnosis not present

## 2017-12-10 ENCOUNTER — Encounter: Payer: Self-pay | Admitting: Family Medicine

## 2017-12-10 ENCOUNTER — Ambulatory Visit (INDEPENDENT_AMBULATORY_CARE_PROVIDER_SITE_OTHER): Payer: Medicare Other | Admitting: Family Medicine

## 2017-12-10 VITALS — BP 123/47 | HR 61 | Ht 68.0 in | Wt 228.0 lb

## 2017-12-10 DIAGNOSIS — M1000 Idiopathic gout, unspecified site: Secondary | ICD-10-CM | POA: Diagnosis not present

## 2017-12-10 DIAGNOSIS — E1122 Type 2 diabetes mellitus with diabetic chronic kidney disease: Secondary | ICD-10-CM

## 2017-12-10 DIAGNOSIS — Z1159 Encounter for screening for other viral diseases: Secondary | ICD-10-CM

## 2017-12-10 DIAGNOSIS — E113213 Type 2 diabetes mellitus with mild nonproliferative diabetic retinopathy with macular edema, bilateral: Secondary | ICD-10-CM | POA: Diagnosis not present

## 2017-12-10 DIAGNOSIS — I2510 Atherosclerotic heart disease of native coronary artery without angina pectoris: Secondary | ICD-10-CM | POA: Diagnosis not present

## 2017-12-10 DIAGNOSIS — I10 Essential (primary) hypertension: Secondary | ICD-10-CM | POA: Diagnosis not present

## 2017-12-10 DIAGNOSIS — N184 Chronic kidney disease, stage 4 (severe): Secondary | ICD-10-CM

## 2017-12-10 LAB — HEMOGLOBIN A1C: HEMOGLOBIN A1C: 6.4

## 2017-12-10 MED ORDER — ATORVASTATIN CALCIUM 80 MG PO TABS: 80.0000 mg | ORAL_TABLET | Freq: Every day | ORAL | 3 refills | Status: DC

## 2017-12-10 MED ORDER — METOPROLOL SUCCINATE ER 50 MG PO TB24: 50.0000 mg | ORAL_TABLET | Freq: Every day | ORAL | 3 refills | Status: DC

## 2017-12-10 NOTE — Progress Notes (Addendum)
Subjective:    CC: Gout ck  HPI:  Gout -his last uric acid was 4.6.  We discussed possibly decreasing his dose and seeing if his uric acid is still well controlled on less medication.  Hypertension- Pt denies chest pain, SOB, dizziness, or heart palpitations.  Taking meds as directed w/o problems.  Denies medication side effects.    OSA - he is using his CPAP nightly. He will be getting a new machine soon.  He is doing well on his CPAP and says he feel better when he uses it.   DM - Last eye exam for his retinopathy and macular degeneration as in December.   CKD 4 - he is being evaluated for possible transplant.  He actually has someone from his church who is going to be donated a kidney for him.  Right now he is CKD 4 so they are going to just continue to monitor him but when he gets to the point of CKD 5 and they will do a transplant.  Past medical history, Surgical history, Family history not pertinant except as noted below, Social history, Allergies, and medications have been entered into the medical record, reviewed, and corrections made.   Review of Systems: No fevers, chills, night sweats, weight loss, chest pain, or shortness of breath.   Objective:    General: Well Developed, well nourished, and in no acute distress.  Neuro: Alert and oriented x3, extra-ocular muscles intact, sensation grossly intact.  HEENT: Normocephalic, atraumatic  Skin: Warm and dry, no rashes. Cardiac: Regular rate and rhythm, no murmurs rubs or gallops, no lower extremity edema.  Respiratory: Clear to auscultation bilaterally. Not using accessory muscles, speaking in full sentences.   Impression and Recommendations:    Gout -   Doing well on allopurinol once a day.  Last uric acid level was 6.5 on December 07, 2017.  HTN - Well controlled. Continue current regimen. Follow up in  6 months.   OSA - will wait till he gets a new machine to get a download. He is using it consistantly and it is effective  in controlling his symptoms.    Diabetic retinopathy -following regularly with the eye doctor. A1C well controlled.    CKD 4  - evaluated for renal trasplant.  He has a living donor designated through his church.  DM- last A1C with endocrine was 6.4 in December.  He is not on ACE bc of renal function and he is not on statin.

## 2017-12-11 ENCOUNTER — Encounter: Payer: Self-pay | Admitting: Family Medicine

## 2017-12-11 LAB — BRAIN NATRIURETIC PEPTIDE: BRAIN NATRIURETIC PEPTIDE: 137

## 2017-12-11 LAB — RPR: RPR: REACTIVE

## 2017-12-11 LAB — LIPASE: LIPASE: 17

## 2017-12-11 LAB — HEPATITIS C ANTIBODY: Hep C Virus Ab: NONREACTIVE

## 2017-12-11 LAB — VARICELLA ZOSTER ABS, IGG/IGM: Varicella Zoster Ab IgM: POSITIVE

## 2017-12-11 LAB — HIV ANTIBODY: HIV 1 AB: NONREACTIVE

## 2017-12-20 DIAGNOSIS — E113213 Type 2 diabetes mellitus with mild nonproliferative diabetic retinopathy with macular edema, bilateral: Secondary | ICD-10-CM | POA: Diagnosis not present

## 2017-12-24 ENCOUNTER — Ambulatory Visit (INDEPENDENT_AMBULATORY_CARE_PROVIDER_SITE_OTHER): Payer: Medicare Other | Admitting: Physician Assistant

## 2017-12-24 VITALS — BP 111/51 | HR 55 | Temp 98.0°F | Resp 16 | Wt 231.8 lb

## 2017-12-24 DIAGNOSIS — Z23 Encounter for immunization: Secondary | ICD-10-CM

## 2017-12-24 DIAGNOSIS — N184 Chronic kidney disease, stage 4 (severe): Secondary | ICD-10-CM | POA: Diagnosis not present

## 2017-12-24 DIAGNOSIS — E1122 Type 2 diabetes mellitus with diabetic chronic kidney disease: Secondary | ICD-10-CM

## 2017-12-24 NOTE — Progress Notes (Signed)
HPI:  Patient is here for his 2nd Hepatitis A/B vaccination. First vaccination was 11/21/2017 - Left Deltoid.  Assessment and Plan: Patient tolerated 2nd vaccination - Right deltoid well without complications.

## 2018-01-08 ENCOUNTER — Other Ambulatory Visit: Payer: Self-pay

## 2018-01-08 MED ORDER — CLONIDINE HCL 0.2 MG PO TABS: 0.2000 mg | ORAL_TABLET | Freq: Every day | ORAL | 2 refills | Status: DC

## 2018-01-10 DIAGNOSIS — E113211 Type 2 diabetes mellitus with mild nonproliferative diabetic retinopathy with macular edema, right eye: Secondary | ICD-10-CM | POA: Diagnosis not present

## 2018-01-10 DIAGNOSIS — Z794 Long term (current) use of insulin: Secondary | ICD-10-CM | POA: Diagnosis not present

## 2018-01-10 DIAGNOSIS — E113212 Type 2 diabetes mellitus with mild nonproliferative diabetic retinopathy with macular edema, left eye: Secondary | ICD-10-CM | POA: Diagnosis not present

## 2018-01-13 ENCOUNTER — Other Ambulatory Visit: Payer: Self-pay | Admitting: Family Medicine

## 2018-01-13 DIAGNOSIS — M10031 Idiopathic gout, right wrist: Secondary | ICD-10-CM

## 2018-01-13 DIAGNOSIS — M1000 Idiopathic gout, unspecified site: Secondary | ICD-10-CM

## 2018-01-17 ENCOUNTER — Telehealth: Payer: Self-pay

## 2018-01-17 NOTE — Telephone Encounter (Signed)
Patient's wife, [name], called in requesting copy of sleep study report from 2007 for patient now that [facility] is no longer in business. She stated Medicare has approved patient for new cpap machine. Advised spouse copy will be up front for her to pick up. Advised to contact office if a more current sleep study is needed so one can be Rx. She stated she would.

## 2018-01-24 DIAGNOSIS — E113211 Type 2 diabetes mellitus with mild nonproliferative diabetic retinopathy with macular edema, right eye: Secondary | ICD-10-CM | POA: Diagnosis not present

## 2018-01-28 DIAGNOSIS — R972 Elevated prostate specific antigen [PSA]: Secondary | ICD-10-CM | POA: Diagnosis not present

## 2018-01-28 DIAGNOSIS — Z01818 Encounter for other preprocedural examination: Secondary | ICD-10-CM | POA: Diagnosis not present

## 2018-02-07 DIAGNOSIS — R972 Elevated prostate specific antigen [PSA]: Secondary | ICD-10-CM | POA: Diagnosis not present

## 2018-02-12 DIAGNOSIS — E113213 Type 2 diabetes mellitus with mild nonproliferative diabetic retinopathy with macular edema, bilateral: Secondary | ICD-10-CM | POA: Diagnosis not present

## 2018-02-12 DIAGNOSIS — E113212 Type 2 diabetes mellitus with mild nonproliferative diabetic retinopathy with macular edema, left eye: Secondary | ICD-10-CM | POA: Diagnosis not present

## 2018-02-18 ENCOUNTER — Telehealth: Payer: Self-pay | Admitting: Cardiology

## 2018-02-18 DIAGNOSIS — R809 Proteinuria, unspecified: Secondary | ICD-10-CM | POA: Diagnosis not present

## 2018-02-18 DIAGNOSIS — M109 Gout, unspecified: Secondary | ICD-10-CM | POA: Diagnosis not present

## 2018-02-18 DIAGNOSIS — E0822 Diabetes mellitus due to underlying condition with diabetic chronic kidney disease: Secondary | ICD-10-CM | POA: Diagnosis not present

## 2018-02-18 DIAGNOSIS — N2581 Secondary hyperparathyroidism of renal origin: Secondary | ICD-10-CM | POA: Diagnosis not present

## 2018-02-18 DIAGNOSIS — E669 Obesity, unspecified: Secondary | ICD-10-CM | POA: Diagnosis not present

## 2018-02-18 DIAGNOSIS — I129 Hypertensive chronic kidney disease with stage 1 through stage 4 chronic kidney disease, or unspecified chronic kidney disease: Secondary | ICD-10-CM | POA: Diagnosis not present

## 2018-02-18 DIAGNOSIS — D631 Anemia in chronic kidney disease: Secondary | ICD-10-CM | POA: Diagnosis not present

## 2018-02-18 DIAGNOSIS — N184 Chronic kidney disease, stage 4 (severe): Secondary | ICD-10-CM | POA: Diagnosis not present

## 2018-02-18 NOTE — Telephone Encounter (Signed)
New message    Spouse requesting call from nurse. Has questions about pending transplant. Spouse also advised to have surgical office to send/ call clearance form. Patient and spouse declined next appointment with APP.  Please call

## 2018-02-18 NOTE — Telephone Encounter (Signed)
Returned call to [name] patient's wife concerning patient of Dr. Stanford Breed. They are concerned that PA cannot clear patient for surgery. Explained that PA can clear patient pending his appointment and any workup/testing that needs to be completed just as the MD can. He is scheduled to see [place], [place] on 4/2 @ 0930  Per wife, stress test is to be faxed for appointment.

## 2018-02-19 ENCOUNTER — Ambulatory Visit (INDEPENDENT_AMBULATORY_CARE_PROVIDER_SITE_OTHER): Payer: Medicare Other | Admitting: Physician Assistant

## 2018-02-19 ENCOUNTER — Encounter: Payer: Self-pay | Admitting: Physician Assistant

## 2018-02-19 VITALS — BP 118/62 | HR 53 | Ht 68.0 in | Wt 233.4 lb

## 2018-02-19 DIAGNOSIS — G4733 Obstructive sleep apnea (adult) (pediatric): Secondary | ICD-10-CM

## 2018-02-19 DIAGNOSIS — Z01810 Encounter for preprocedural cardiovascular examination: Secondary | ICD-10-CM

## 2018-02-19 DIAGNOSIS — Z9989 Dependence on other enabling machines and devices: Secondary | ICD-10-CM

## 2018-02-19 DIAGNOSIS — E119 Type 2 diabetes mellitus without complications: Secondary | ICD-10-CM | POA: Diagnosis not present

## 2018-02-19 DIAGNOSIS — I5032 Chronic diastolic (congestive) heart failure: Secondary | ICD-10-CM

## 2018-02-19 DIAGNOSIS — I2510 Atherosclerotic heart disease of native coronary artery without angina pectoris: Secondary | ICD-10-CM

## 2018-02-19 DIAGNOSIS — I10 Essential (primary) hypertension: Secondary | ICD-10-CM

## 2018-02-19 DIAGNOSIS — I480 Paroxysmal atrial fibrillation: Secondary | ICD-10-CM

## 2018-02-19 DIAGNOSIS — E785 Hyperlipidemia, unspecified: Secondary | ICD-10-CM

## 2018-02-19 DIAGNOSIS — Z794 Long term (current) use of insulin: Secondary | ICD-10-CM

## 2018-02-19 NOTE — Progress Notes (Signed)
Cardiology Office Note    Date:  02/21/2018   ID:  Louis [name], DOB May 01, 1946, MRN [id number]  PCP:  [name], MD  Cardiologist:  Dr. Stanford Breed  Chief Complaint  Patient presents with  . Pre-op Exam    seen for Dr. Stanford Breed, requested by [facility] renal transplant service    History of Present Illness:  Louis [name] is a 72 y.o. male with PMH of HTN, HLD, DM II, OSA on CPAP, h/o syncope, diastolic CHF and CAD.  Myoview in 02/2011 showed apical ischemia, EF 56%.  Cardiac catheterization in 02/2011 showed 50% distal left main stenosis, mild diffuse 20% proximal LAD lesion.  LAD was occluded in the midsegment after second diagonal and septal branch.  There were very faint collaterals, however overall appears to be diffusely diseased.  60% proximal D1 stenosis, 40-50% proximal D2 stenosis, subtotally occluded D3, 40% disease in the mid left circumflex, 30% proximal RCA, 20% mid RCA segment.  Medical therapy was recommended.  Echocardiogram in March 2018 showed normal LV function grade 2 DD, mild MR.  Patient was admitted in March 2018 with syncope.  This was related to defecation at the time.  Metoprolol was decreased due to bradycardia.  Patient was last seen by Dr. Stanford Breed on 03/14/2017, which time he was doing well.  Last carotid ultrasound on 03/15/2017 showed less than 50% bilateral ICA stenosis.  Patient presents today for preoperative clearance requested by [facility] renal transplant service.  He denies any recent chest pain or shortness of breath.  His EKG shows chronic ST depression in lead I and aVL and chronic ST elevation in lead III, this is actually unchanged when compared to the previous EKG in 2018.  We have made a copy of the EKG for the patient to keep in his back pocket.  I recommended a 30-day event monitor to see if he has a recurrent atrial fibrillation before deciding on systemic anticoagulation.  I think his recent onset of atrial fibrillation is caused by  dobutamine during stress test.  It is unclear why he had no wall motion abnormality on stress echo when he had occluded mid LAD on previous cardiac catheterization.  I have discussed the case with DOD Dr. Ellyn Hack, we do not recommend any future dobutamine stress echo in this patient who has a known moderate left main disease since 2012.  In the future, if he does need a repeat stress test, we would recommend a Lexiscan Myoview.  Otherwise given the lack of symptom, he is cleared to proceed with surgery.  He is a moderate to high risk patient for a moderate risk surgery.  I do not think this is risk prohibitive for him to go through with the surgery.  He has no lower extremity edema, orthopnea or PND.  No additional study is needed prior to renal transplant surgery.   Past Medical History:  Diagnosis Date  . Brittle bones    per pt, has soft bones in right foot/wears boot cast!  . CAD (coronary artery disease)   . Cataract    Bil/ surg scheduled for right eye 01/18/17/ left eye 02/08/17  . CHF (congestive heart failure) ([place]) 2015  . Diabetes mellitus   . Heart failure, diastolic ([place])   . Hyperlipidemia   . Hypertension   . Macular edema 2014  . OSA on CPAP   . Personal history of colonic polyps - adenomas 01/28/2014  . Renal insufficiency   . Shortness of breath  dyspnea   . Syncope and collapse     Past Surgical History:  Procedure Laterality Date  . CARPAL TUNNEL RELEASE     left hand  . COLONOSCOPY    . KNEE ARTHROSCOPY Right 09/13/2016   [facility], Dr. Dorna Leitz  . PILONIDAL CYST EXCISION      Current Medications: Outpatient Medications Prior to Visit  Medication Sig Dispense Refill  . allopurinol (ZYLOPRIM) 100 MG tablet TAKE 3 TABLETS BY MOUTH 2 TIMES A DAY 540 tablet 1  . aspirin 81 MG tablet 81 mg daily.    Marland Kitchen atorvastatin (LIPITOR) 80 MG tablet Take 1 tablet (80 mg total) by mouth daily. 90 tablet 3  . B-D INS SYR ULTRAFINE 1CC/31G 31G X 5/16" 1 ML MISC     .  cloNIDine (CATAPRES) 0.2 MG tablet Take 1 tablet (0.2 mg total) by mouth daily. 90 tablet 2  . furosemide (LASIX) 80 MG tablet Take 80 mg by mouth 2 (two) times daily.     Marland Kitchen gabapentin (NEURONTIN) 300 MG capsule TAKE 1 CAPSULE (300 MG TOTAL) BY MOUTH 2 (TWO) TIMES DAILY. 180 capsule 3  . hydrALAZINE (APRESOLINE) 100 MG tablet Take 100 mg by mouth 2 (two) times daily.     . isosorbide mononitrate (ISMO,MONOKET) 20 MG tablet Take 1 tablet (20 mg total) by mouth 2 (two) times daily at 10 AM and 5 PM. 180 tablet 3  . metolazone (ZAROXOLYN) 5 MG tablet Take 5 mg by mouth daily as needed (swelling).     . metoprolol succinate (TOPROL-XL) 50 MG 24 hr tablet Take 1 tablet (50 mg total) by mouth daily. 90 tablet 3  . nitroGLYCERIN (NITROSTAT) 0.4 MG SL tablet Place 1 tablet (0.4 mg total) under the tongue every 5 (five) minutes as needed for chest pain. 25 tablet 0  . NOVOLIN N 100 UNIT/ML injection Inject 50 Units into the skin 2 (two) times daily before a meal. Novolin 50 units twice a day    . NOVOLIN R 100 UNIT/ML injection Injects 15-20 units TID with meals using a sliding scale  5  . ONE TOUCH ULTRA TEST test strip USE TO CHECK BLOOD SUGAR 4 TIMES A DAY DX E11.65  1  . prednisoLONE acetate (PRED FORTE) 1 % ophthalmic suspension Place 1 drop into the left eye 3 times daily. QD OD, Qid OS    . triamcinolone ointment (KENALOG) 0.5 % Apply 1 application topically 2 (two) times daily. To affected area, avoid eyes and face 30 g 3   Facility-Administered Medications Prior to Visit  Medication Dose Route Frequency Provider Last Rate Last Dose  . 0.9 %  sodium chloride infusion  500 mL Intravenous Continuous Gatha Mayer, MD         Allergies:   Hydrocodone and Oxycodone   Social History   Socioeconomic History  . Marital status: Married    Spouse name: Not on file  . Number of children: 5  . Years of education: Not on file  . Highest education level: Not on file  Occupational History  .  Occupation: Office manager  . Financial resource strain: Not on file  . Food insecurity:    Worry: Not on file    Inability: Not on file  . Transportation needs:    Medical: Not on file    Non-medical: Not on file  Tobacco Use  . Smoking status: Former Smoker    Packs/day: 0.50    Years: 20.00  Pack years: 10.00    Types: Cigarettes    Last attempt to quit: 03/06/1977    Years since quitting: 40.9  . Smokeless tobacco: Never Used  Substance and Sexual Activity  . Alcohol use: No  . Drug use: No  . Sexual activity: Never  Lifestyle  . Physical activity:    Days per week: Not on file    Minutes per session: Not on file  . Stress: Not on file  Relationships  . Social connections:    Talks on phone: Not on file    Gets together: Not on file    Attends religious service: Not on file    Active member of club or organization: Not on file    Attends meetings of clubs or organizations: Not on file    Relationship status: Not on file  Other Topics Concern  . Not on file  Social History Narrative  . Not on file     Family History:  The patient's family history includes Cancer in his maternal uncle and paternal grandmother; Coronary artery disease in his father; Diabetes in his brother, father, maternal grandmother, mother, and sister; Heart attack in his paternal grandfather; Kidney disease in his mother.   ROS:   Please see the history of present illness.    ROS All other systems reviewed and are negative.   PHYSICAL EXAM:   VS:  BP 118/62   Pulse (!) 53   Ht 5\' 8"  (1.727 m)   Wt 233 lb 6.4 oz (105.9 kg)   BMI 35.49 kg/m    GEN: Well nourished, well developed, in no acute distress  HEENT: normal  Neck: no JVD, carotid bruits, or masses Cardiac: RRR; no murmurs, rubs, or gallops,no edema  Respiratory:  clear to auscultation bilaterally, normal work of breathing GI: soft, nontender, nondistended, + BS MS: no deformity or atrophy  Skin: warm and dry, no  rash Neuro:  Alert and Oriented x 3, Strength and sensation are intact Psych: euthymic mood, full affect  Wt Readings from Last 3 Encounters:  02/19/18 233 lb 6.4 oz (105.9 kg)  12/24/17 231 lb 12.8 oz (105.1 kg)  12/10/17 228 lb (103.4 kg)      Studies/Labs Reviewed:   EKG:  EKG is ordered today.  The ekg ordered today demonstrates sinus rhythm, first-degree AV block, chronic ST depression in lead I and aVL, chronic ST elevation in lead III.  This is the same EKG compared to 2018.  Recent Labs: 11/27/2017: ALT 14; Brain Natriuretic Peptide 137; BUN 105; Creatinine 3.0; Hemoglobin 12.9; Platelets 216; Potassium 2.9; Sodium 137   Lipid Panel    Component Value Date/Time   CHOL 116 11/27/2017   TRIG 135 11/27/2017   HDL 31 (A) 11/27/2017   CHOLHDL 3.9 04/11/2016 1048   VLDL 28 04/11/2016 1048   LDLCALC 66 11/27/2017    Additional studies/ records that were reviewed today include:   Echo 02/03/2017 LV EF: 60% -   65%  Study Conclusions  - Left ventricle: The cavity size was normal. There was moderate   concentric hypertrophy. Systolic function was normal. The   estimated ejection fraction was in the range of 60% to 65%. Wall   motion was normal; there were no regional wall motion   abnormalities. Features are consistent with a pseudonormal left   ventricular filling pattern, with concomitant abnormal relaxation   and increased filling pressure (grade 2 diastolic dysfunction).   Doppler parameters are consistent with high ventricular filling  pressure. - Aortic valve: Trileaflet; mildly thickened, mildly calcified   leaflets. - Mitral valve: There was mild regurgitation. - Left atrium: Anterior-posterior dimension: 50 mm. - Atrial septum: There was increased thickness of the septum,   consistent with lipomatous hypertrophy. - Pulmonic valve: There was trivial regurgitation. - Pulmonary arteries: Systolic pressure could not be accurately   estimated.     Echo  01/07/2018 SUMMARY  The left ventricular size is normal.  Moderate left ventricular hypertrophy  LV ejection fraction = >70%.  The left ventricle is hyperdynamic.    Left ventricular filling pattern is prolonged relaxation.  The right ventricle is normal size.  The right ventricular systolic function is normal.  The left atrium is mildly dilated.  No significant stenosis or regurgitation seen  There was insufficient TR detected to calculate RV systolic pressure.  Estimated right atrial pressure is 5 mmHg.Marland Kitchen  There is no pericardial effusion.  There is no comparison study available.     Stress echo 01/07/2018 SUMMARY The patient had no chest pain during stress The patient achieved 98 % of maximum predicted heart rate. Negative stress ECG for inducible ischemia at target heart rate. Negative dobutamine echocardiography for inducible ischemia at target heart  rate. Patient's rhythm went into atrial fibrillation in recovery. - FINDINGS: - ECG REST Sinus Bradycardia. PACs. The baseline ECG displays diffuse abnormal ST  segments. The baseline ECG displays diffuse abnormal T waves. The patient was  in first degree heart block. - ECG STRESS No diagnostic ST segment changes were seen. Negative stress ECG for inducible  ischemia at target heart rate. The stress protocol, dobutamine perfusion  time, ECG changes, blood pressure and heart rate responses are shown in the  Cardiology Scan portion of this report in EPIC. PVC's. PAC's. The patient was  in atrial fibrillation. Triplets, Runs of NSVT. - REST ECHO Normal left ventricular function at rest. There was normal left ventricular  wall motion at rest. The estimated LV ejection fraction is 60-65% . - LOW DOSE There was normal augmentation of all left ventricular wall segments with  dobutamine. There were no segmental wall motion abnormalities with low dose  of dobutamine. - PEAK DOSE Normal left ventricular function  and global wall motion with stress. There  was normal augmentation of all left ventricular wall segments with  dobutamine. There were no segmental wall motion abnormalities with  dobutamine. The estimated LV ejection fraction is >70% with stress. Negative  dobutamine echocardiography for inducible ischemia at target heart rate. - - WALL MOTION - Stress Results Protocol: DobutamineMaximum Predicted HR: 148 bpm Target HR: 126 bpm% Maximum Predicted HR: 99 % StageDuration (mm:ss) Heart Rate (bpm) BPDose Comment 13:00 50 136/6510.00 23:00 61 183/7720.00 33:41 146 149/6830.00 1 mg Atropine 1R2:00 130 149/80 pvcs 22:00 114 146/77 310:40 74 137/65 triplet Stress Duration: 9:53 mm:ss *Recovery Time: 2:00 mm:ss Maximum Stress HR: 146 bpm * MMode/2D Measurements & Calculations IVSd: 1.5 cm LVIDd: 4.6 cm LVPWd: 0.87 cm LVIDs: 2.5 cm EDV(MOD-sp4): 114.0 ml ESV(MOD-sp4): 31.2 ml EDV(MOD-sp2): 81.6 ml ESV(MOD-sp2): 31.5 ml   ASSESSMENT:    1. Preop cardiovascular exam   2. PAF (paroxysmal atrial fibrillation) ([place])   3. Coronary artery disease involving native coronary artery of native heart without angina pectoris   4. Essential hypertension   5. Hyperlipidemia, unspecified hyperlipidemia type   6. Controlled type 2 diabetes mellitus without complication, with long-term current use of insulin ([place])   7. OSA on CPAP   8. Chronic diastolic heart failure ([place])  PLAN:  In order of problems listed above:  1. Preoperative clearance: Patient already had a dobutamine stress echocardiogram done at [facility] which was negative.  I have reviewed his stress echo with DOD Dr. Ellyn Hack, it is unclear to [place] why he had no wall motion abnormality on stress echo despite known occluded mid LAD since 2012.  Also we would not recommend any future dobutamine stress echo given his moderate left main disease.  In the future if any further stress test is needed, would consider  Lexiscan Myoview instead.  Otherwise given the negative test and the lack of symptom, no further workup is needed.  He would be a moderate to moderately high risk patient to proceed with a moderate risk surgery.  Although his risk is not prohibitive to proceed with surgery.  2. PAF: He had a brief episode of atrial fibrillation during the dobutamine stress echo, although I think it is likely his atrial fibrillation was related to the dobutamine.  I would recommend a 30-day event monitor and hold off on initiation of systemic anticoagulation therapy. CHA2DS2-Vasc score 5 (age, HTN, DM II, CHF and CAD)  3. Hypertension: Blood pressure stable.  4. Hyperlipidemia: Continue Lipitor 80 mg daily.  Last lipid panel obtained  the in January 2019 showed triglycerides 135, total cholesterol 116, HDL 31, LDL 66.  Continue on current therapy.  5. DM 2: Managed by primary care provider, on insulin  6. Obstructive sleep apnea on CPAP: Continue with CPAP machine.  7. Chronic diastolic heart failure: Euvolemic on physical exam.  Continue with Lasix 80 mg twice daily    Medication Adjustments/Labs and Tests Ordered: Current medicines are reviewed at length with the patient today.  Concerns regarding medicines are outlined above.  Medication changes, Labs and Tests ordered today are listed in the Patient Instructions below. Patient Instructions  Isaac Laud PA has recommended that you wear an event monitor for 30 days. This is placed @ [address]. Event monitors are medical devices that record the heart's electrical activity. Doctors most often [place] these monitors to diagnose arrhythmias. Arrhythmias are problems with the speed or rhythm of the heartbeat. The monitor is a small, portable device. You can wear one while you do your normal daily activities. This is usually used to diagnose what is causing palpitations/syncope (passing out).  Isaac Laud PA recommends that you schedule a follow-up appointment in:  [place] with Dr. Natividad Brood PA will clear you for your procedure     Signed, Almyra Deforest, [place]  02/21/2018 10:39 PM    [address], [place], [place]  [address] Phone: [phone]; Fax: [phone]

## 2018-02-19 NOTE — Patient Instructions (Signed)
Isaac Laud PA has recommended that you wear an event monitor for 30 days. This is placed @ [address]. Event monitors are medical devices that record the heart's electrical activity. Doctors most often [place] these monitors to diagnose arrhythmias. Arrhythmias are problems with the speed or rhythm of the heartbeat. The monitor is a small, portable device. You can wear one while you do your normal daily activities. This is usually used to diagnose what is causing palpitations/syncope (passing out).  Isaac Laud PA recommends that you schedule a follow-up appointment in: [place] with Dr. Natividad Brood PA will clear you for your procedure

## 2018-02-21 ENCOUNTER — Encounter: Payer: Self-pay | Admitting: Physician Assistant

## 2018-02-27 DIAGNOSIS — Z961 Presence of intraocular lens: Secondary | ICD-10-CM | POA: Insufficient documentation

## 2018-02-28 DIAGNOSIS — E113211 Type 2 diabetes mellitus with mild nonproliferative diabetic retinopathy with macular edema, right eye: Secondary | ICD-10-CM | POA: Diagnosis not present

## 2018-03-04 ENCOUNTER — Ambulatory Visit (INDEPENDENT_AMBULATORY_CARE_PROVIDER_SITE_OTHER): Payer: Medicare Other

## 2018-03-04 DIAGNOSIS — I480 Paroxysmal atrial fibrillation: Secondary | ICD-10-CM | POA: Diagnosis not present

## 2018-03-04 DIAGNOSIS — Z01810 Encounter for preprocedural cardiovascular examination: Secondary | ICD-10-CM

## 2018-03-07 ENCOUNTER — Ambulatory Visit (INDEPENDENT_AMBULATORY_CARE_PROVIDER_SITE_OTHER): Payer: Medicare Other | Admitting: Orthopedic Surgery

## 2018-03-07 ENCOUNTER — Encounter (INDEPENDENT_AMBULATORY_CARE_PROVIDER_SITE_OTHER): Payer: Self-pay | Admitting: Orthopedic Surgery

## 2018-03-07 DIAGNOSIS — E11610 Type 2 diabetes mellitus with diabetic neuropathic arthropathy: Secondary | ICD-10-CM | POA: Diagnosis not present

## 2018-03-07 DIAGNOSIS — I87323 Chronic venous hypertension (idiopathic) with inflammation of bilateral lower extremity: Secondary | ICD-10-CM | POA: Diagnosis not present

## 2018-03-07 DIAGNOSIS — I2510 Atherosclerotic heart disease of native coronary artery without angina pectoris: Secondary | ICD-10-CM

## 2018-03-07 NOTE — Progress Notes (Signed)
Office Visit Note   Patient: Louis [name]           Date of Birth: Oct 08, 1946           MRN: [id number] Visit Date: 03/07/2018              Requested by: [name], [place] [place], [place] [address] PCP: [name], MD  Chief Complaint  Patient presents with  . 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  Patient has end-stage renal disease as well and is being considered for renal transplant.  Assessment & Plan: Visit Diagnoses:  1. 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 Idiopathic chronic venous hypertension of both lower extremities with inflammation     Plan: Continue with the double upright braces continue with the compression stockings for the venous insufficiency do not use Mercurochrome or other toxic materials on his tibial wound.  Follow-up if there is any acute redness swelling or any changes in his lower extremities.  Follow-Up Instructions: Return if symptoms worsen or fail to improve.   Ortho Exam  Patient is alert, oriented, no adenopathy, well-dressed, normal affect, normal respiratory effort. Examination patient has pitting edema from venous insufficiency he has a very superficial venous ulcer on the right leg that is approximately 15 mm in diameter 0.1 mm deep there is no CureChrome over this discussed the importance of not using toxic material on these open wounds.  Recommend he continue with his compression stockings.  Patient's foot is plantigrade he does have a little bit of swelling but there is no redness no cellulitis no ulcers his foot is plantigrade there is no rocker-bottom deformity no signs of infection or active Charcot process.  Imaging: No results found. No images are attached to the  encounter.  Labs: Lab Results  Component Value Date   HGBA1C 6.5 11/27/2017   HGBA1C 6.4 09/12/2017   HGBA1C 6.4 03/13/2017   ESRSEDRATE 81 (H) 02/17/2016   ESRSEDRATE 71 (H) 04/20/2015   CRP 7.0 (H) 02/17/2016   LABURIC 4.6 06/12/2017   LABURIC 5.9 12/20/2016   LABURIC 2.4 (L) 07/03/2016   GRAMSTAIN Few 04/20/2015   GRAMSTAIN WBC present-both PMN and Mononuclear 04/20/2015   GRAMSTAIN No Organisms Seen 04/20/2015   LABORGA ENTEROCOCCUS SPECIES 01/28/2016    @LABSALLVALUES (HGBA1)@  There is no height or weight on file to calculate BMI.  Orders:  No orders of the defined types were placed in this encounter.  No orders of the defined types were placed in this encounter.    Procedures: No procedures performed  Clinical Data: No additional findings.  ROS:  All other systems negative, except as noted in the HPI. Review of Systems  Objective: Vital Signs: There were no vitals taken for this visit.  Specialty Comments:  No specialty comments available.  PMFS History: Patient Active Problem List   Diagnosis Date Noted  . Venous insufficiency (chronic) (peripheral) 09/06/2017  . Diabetic peripheral neuropathy ([place]) 06/12/2017  . Syncope and collapse 02/02/2017  . Charcot gait 02/02/2017  . Charcot's joint 02/02/2017  . Heart failure, diastolic ([place])   . 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 Diabetic polyneuropathy associated with type 2 diabetes mellitus ([place]) 12/25/2016  . Idiopathic  chronic venous hypertension of both lower extremities with inflammation 12/25/2016  . Cataract, right 05/18/2016  . Right cervical radiculopathy 05/12/2016  . 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 ([place]) 10/12/2015  . 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 Gout 04/20/2015  . Osteoarthritis of both acromioclavicular joints 12/03/2014  . Left shoulder  pain 11/17/2014  . Chronic CHF (congestive heart failure) ([place]) 10/05/2014  . Unstable angina pectoris ([place]) 10/05/2014  . Acute diastolic heart failure ([place]) 10/05/2014  . History of colonic polyps 01/28/2014  . Bilateral carpal tunnel syndrome 01/09/2014  . Macular edema, diabetic ([place]) 10/31/2013  . Obesity (BMI 30-39.9) 07/31/2013  . Fatigue 03/06/2012  . Astigmatism 01/11/2012  . CAD (coronary artery disease), native coronary artery 05/17/2011  . Nonspecific abnormal unspecified cardiovascular function study 03/01/2011  . Bradycardia 02/08/2011  . Right knee pain 02/08/2011  . Hyperlipidemia   . RBBB 02/01/2011  . Chronic kidney disease, stage 4, severely decreased GFR (H[facility]) 10/23/2006  . Proteinuria 10/23/2006  . Obstructive sleep apnea 10/05/2006  . Diabetes mellitus with stage 4 chronic kidney disease ([place]) 08/28/2006  . HYPERTENSION, BENIGN SYSTEMIC 08/28/2006   Past Medical History:  Diagnosis Date  . Brittle bones    per pt, has soft bones in right foot/wears boot cast!  . CAD (coronary artery disease)   . Cataract    Bil/ surg scheduled for right eye 01/18/17/ left eye 02/08/17  . CHF (congestive heart failure) ([place]) 2015  . Diabetes mellitus   . Heart failure, diastolic ([place])   . Hyperlipidemia   . Hypertension   . Macular edema 2014  . OSA on CPAP   . Personal history of colonic polyps - adenomas 01/28/2014  . Renal insufficiency   . Shortness of breath dyspnea   . Syncope and collapse     Family History  Problem Relation Age of Onset  . Diabetes Mother   . Kidney disease Mother   . Diabetes Father   . Coronary artery disease Father   . Diabetes Brother   . Diabetes Sister   . Cancer Maternal Uncle   . Diabetes Maternal Grandmother   . Cancer Paternal Grandmother   . Heart attack Paternal Grandfather   . Colon cancer Neg Hx     Past Surgical History:  Procedure Laterality Date  . CARPAL TUNNEL RELEASE     left hand  . COLONOSCOPY    . KNEE  ARTHROSCOPY Right 09/13/2016   [facility], Dr. Dorna Leitz  . PILONIDAL CYST EXCISION     Social History   Occupational History  . Occupation: Warehouse  Tobacco Use  . Smoking status: Former Smoker    Packs/day: 0.50    Years: 20.00    Pack years: 10.00    Types: Cigarettes    Last attempt to quit: 03/06/1977    Years since quitting: 41.0  . Smokeless tobacco: Never Used  Substance and Sexual Activity  . Alcohol use: No  . Drug use: No  . Sexual activity: Never

## 2018-03-14 DIAGNOSIS — E1122 Type 2 diabetes mellitus with diabetic chronic kidney disease: Secondary | ICD-10-CM | POA: Diagnosis not present

## 2018-03-14 DIAGNOSIS — E11610 Type 2 diabetes mellitus with diabetic neuropathic arthropathy: Secondary | ICD-10-CM | POA: Diagnosis not present

## 2018-03-14 DIAGNOSIS — I10 Essential (primary) hypertension: Secondary | ICD-10-CM | POA: Diagnosis not present

## 2018-03-14 DIAGNOSIS — E1165 Type 2 diabetes mellitus with hyperglycemia: Secondary | ICD-10-CM | POA: Diagnosis not present

## 2018-03-14 DIAGNOSIS — N184 Chronic kidney disease, stage 4 (severe): Secondary | ICD-10-CM | POA: Diagnosis not present

## 2018-03-14 DIAGNOSIS — E113519 Type 2 diabetes mellitus with proliferative diabetic retinopathy with macular edema, unspecified eye: Secondary | ICD-10-CM | POA: Diagnosis not present

## 2018-03-14 DIAGNOSIS — Z794 Long term (current) use of insulin: Secondary | ICD-10-CM | POA: Diagnosis not present

## 2018-03-21 DIAGNOSIS — E113212 Type 2 diabetes mellitus with mild nonproliferative diabetic retinopathy with macular edema, left eye: Secondary | ICD-10-CM | POA: Diagnosis not present

## 2018-03-21 DIAGNOSIS — E113211 Type 2 diabetes mellitus with mild nonproliferative diabetic retinopathy with macular edema, right eye: Secondary | ICD-10-CM | POA: Diagnosis not present

## 2018-03-24 ENCOUNTER — Telehealth: Payer: Self-pay | Admitting: Physician Assistant

## 2018-03-24 NOTE — Telephone Encounter (Signed)
[facility] called stating first occurrence of Afib recorded at 7:42 AM today with HR in the 70s. Pt not answering his phone. Given his kidney issues and upcoming renal transplant, will defer decisions about anticoagulation to Dr. Stanford Breed.

## 2018-03-25 ENCOUNTER — Telehealth: Payer: Self-pay | Admitting: *Deleted

## 2018-03-25 NOTE — Telephone Encounter (Signed)
Call placed to the patient. [employer] sent a EKG reading of atrial fibrillation from 5/5. The patient was not aware that he had this run of atrial fibrillation. The patient is asympotmatic. He is concerned that this may affect his upcoming kidney transplant. The readings have been placed in the provider's box. Message routed to the provider.

## 2018-03-26 ENCOUNTER — Encounter: Payer: Self-pay | Admitting: Cardiology

## 2018-03-26 NOTE — Progress Notes (Signed)
HPI: FU CAD and diastolic CHF and evaluate new atrial fibrillation. 4/12 ABIs were normal. Patient had cardiac catheterization in April of 2012 that revealed 50% distal LM stenosis. LAD - mild diffuse 20% disease proximally. The LAD is occluded in the mid segment after the second diagonal and septal branch. It fills through very faint collaterals and overall appears to be diffusely diseased. First diagonal is a small-sized branch with a 60% ostial stenosis. Second diagonal is a relatively large size branch with 40-50% proximal disease. The third diagonal appears to have 2 branches and subtotally occluded. LCX with diffuse 40% disease in the mid segment before the takeoff of OM-3. RCA with 30% proximal discrete stenosis. There is also 20% mid stenosis. There is a 30% distal stenosis before the bifurcation. Medical therapy recommended. Patient admitted March 2018 with syncope. This apparently was felt to be defecation syncope. Toprol decreased due to bradycardia. Carotid dopplers 4/18 showed less than 50 stenosis bilaterally. Echo 2/19 showed hyperdynamic LV function, moderate LVH, mild diastolic dysfunction, mild LAE. Pt had dobutamine echo 2/19 prior to renal transplant; no ischemia; developed atrial fibrillation in recovery. Then had recent outpt monitor that showed recurrent atrial fibrillation. Now for fu. Since last seen, the patient denies any dyspnea on exertion, orthopnea, PND, pedal edema, palpitations, syncope or chest pain.   Current Outpatient Medications  Medication Sig Dispense Refill  . allopurinol (ZYLOPRIM) 100 MG tablet TAKE 3 TABLETS BY MOUTH 2 TIMES A DAY 540 tablet 1  . aspirin 81 MG tablet 81 mg daily.    Marland Kitchen atorvastatin (LIPITOR) 80 MG tablet Take 1 tablet (80 mg total) by mouth daily. 90 tablet 3  . B-D INS SYR ULTRAFINE 1CC/31G 31G X 5/16" 1 ML MISC     . cloNIDine (CATAPRES) 0.2 MG tablet Take 1 tablet (0.2 mg total) by mouth daily. 90 tablet 2  . furosemide (LASIX) 80 MG  tablet Take 80 mg by mouth 2 (two) times daily.     Marland Kitchen gabapentin (NEURONTIN) 300 MG capsule TAKE 1 CAPSULE (300 MG TOTAL) BY MOUTH 2 (TWO) TIMES DAILY. 180 capsule 3  . hydrALAZINE (APRESOLINE) 100 MG tablet Take 100 mg by mouth 2 (two) times daily.     . isosorbide mononitrate (ISMO,MONOKET) 20 MG tablet Take 1 tablet (20 mg total) by mouth 2 (two) times daily at 10 AM and 5 PM. 180 tablet 3  . metolazone (ZAROXOLYN) 5 MG tablet Take 5 mg by mouth daily as needed (swelling).     . metoprolol succinate (TOPROL-XL) 50 MG 24 hr tablet Take 1 tablet (50 mg total) by mouth daily. 90 tablet 3  . nitroGLYCERIN (NITROSTAT) 0.4 MG SL tablet Place 1 tablet (0.4 mg total) under the tongue every 5 (five) minutes as needed for chest pain. 25 tablet 0  . NOVOLIN N 100 UNIT/ML injection Inject 50 Units into the skin 2 (two) times daily before a meal. Novolin 50 units twice a day    . NOVOLIN R 100 UNIT/ML injection Injects 15-20 units TID with meals using a sliding scale  5  . ONE TOUCH ULTRA TEST test strip USE TO CHECK BLOOD SUGAR 4 TIMES A DAY DX E11.65  1   Current Facility-Administered Medications  Medication Dose Route Frequency Provider Last Rate Last Dose  . 0.9 %  sodium chloride infusion  500 mL Intravenous Continuous Gatha Mayer, MD         Past Medical History:  Diagnosis Date  . Brittle  bones    per pt, has soft bones in right foot/wears boot cast!  . CAD (coronary artery disease)   . Cataract    Bil/ surg scheduled for right eye 01/18/17/ left eye 02/08/17  . CHF (congestive heart failure) ([place]) 2015  . Diabetes mellitus   . Heart failure, diastolic ([place])   . Hyperlipidemia   . Hypertension   . Macular edema 2014  . OSA on CPAP   . Personal history of colonic polyps - adenomas 01/28/2014  . Renal insufficiency   . Shortness of breath dyspnea   . Syncope and collapse     Past Surgical History:  Procedure Laterality Date  . CARPAL TUNNEL RELEASE     left hand  . COLONOSCOPY     . KNEE ARTHROSCOPY Right 09/13/2016   [facility], Dr. Dorna Leitz  . PILONIDAL CYST EXCISION      Social History   Socioeconomic History  . Marital status: Married    Spouse name: Not on file  . Number of children: 5  . Years of education: Not on file  . Highest education level: Not on file  Occupational History  . Occupation: Office manager  . Financial resource strain: Not on file  . Food insecurity:    Worry: Not on file    Inability: Not on file  . Transportation needs:    Medical: Not on file    Non-medical: Not on file  Tobacco Use  . Smoking status: Former Smoker    Packs/day: 0.50    Years: 20.00    Pack years: 10.00    Types: Cigarettes    Last attempt to quit: 03/06/1977    Years since quitting: 41.0  . Smokeless tobacco: Never Used  Substance and Sexual Activity  . Alcohol use: No  . Drug use: No  . Sexual activity: Never  Lifestyle  . Physical activity:    Days per week: Not on file    Minutes per session: Not on file  . Stress: Not on file  Relationships  . Social connections:    Talks on phone: Not on file    Gets together: Not on file    Attends religious service: Not on file    Active member of club or organization: Not on file    Attends meetings of clubs or organizations: Not on file    Relationship status: Not on file  . Intimate partner violence:    Fear of current or ex partner: Not on file    Emotionally abused: Not on file    Physically abused: Not on file    Forced sexual activity: Not on file  Other Topics Concern  . Not on file  Social History Narrative  . Not on file    Family History  Problem Relation Age of Onset  . Diabetes Mother   . Kidney disease Mother   . Diabetes Father   . Coronary artery disease Father   . Diabetes Brother   . Diabetes Sister   . Cancer Maternal Uncle   . Diabetes Maternal Grandmother   . Cancer Paternal Grandmother   . Heart attack Paternal Grandfather   . Colon cancer  Neg Hx     ROS: no fevers or chills, productive cough, hemoptysis, dysphasia, odynophagia, melena, hematochezia, dysuria, hematuria, rash, seizure activity, orthopnea, PND, pedal edema, claudication. Remaining systems are negative.  Physical Exam: Well-developed well-nourished in no acute distress.  Skin is warm and dry.  HEENT is normal.  Neck is supple.  Chest is clear to auscultation with normal expansion.  Cardiovascular exam is regular rate and rhythm.  Abdominal exam nontender or distended. No masses palpated. Extremities show trace edema. neuro grossly intact  ECG-sinus rhythm with first-degree AV block.  Lateral T wave inversion.  Personally reviewed  A/P  1 newly diagnosed atrial fibrillation-Check TSH. CHADSvasc 5.  Begin apixaban 5 mg BID. Continue Toprol for rate control.  No symptoms with his atrial fibrillation and we will therefore plan rate control and anticoagulation long term.  2 coronary artery disease-recent dobutamine echocardiogram negative.  Given need for anticoagulation will discontinue aspirin.  Continue statin.  3 chronic diastolic congestive heart failure-continue present dose of diuretics.  4 hypertension-blood pressure is controlled.  Continue present medications.  5 hyperlipidemia-continue statin.  [name], MD

## 2018-03-26 NOTE — Telephone Encounter (Signed)
Follow up scheduled

## 2018-03-27 ENCOUNTER — Ambulatory Visit (INDEPENDENT_AMBULATORY_CARE_PROVIDER_SITE_OTHER): Payer: Medicare Other | Admitting: Cardiology

## 2018-03-27 ENCOUNTER — Encounter: Payer: Self-pay | Admitting: Cardiology

## 2018-03-27 VITALS — BP 130/70 | HR 65 | Ht 68.0 in | Wt 232.8 lb

## 2018-03-27 DIAGNOSIS — E7800 Pure hypercholesterolemia, unspecified: Secondary | ICD-10-CM | POA: Diagnosis not present

## 2018-03-27 DIAGNOSIS — I2510 Atherosclerotic heart disease of native coronary artery without angina pectoris: Secondary | ICD-10-CM | POA: Diagnosis not present

## 2018-03-27 DIAGNOSIS — I10 Essential (primary) hypertension: Secondary | ICD-10-CM | POA: Diagnosis not present

## 2018-03-27 DIAGNOSIS — I480 Paroxysmal atrial fibrillation: Secondary | ICD-10-CM

## 2018-03-27 MED ORDER — APIXABAN 5 MG PO TABS: 5.0000 mg | ORAL_TABLET | Freq: Two times a day (BID) | ORAL | 6 refills | Status: DC

## 2018-03-27 NOTE — Patient Instructions (Signed)
Medication Instructions:   STOP ASPIRIN  START ELIQUIS 5 MG TWICE DAILY  Labwork:  Your physician recommends that you return for lab work in: [place]:  Your physician recommends that you schedule a follow-up appointment in: [place]   If you need a refill on your cardiac medications before your next appointment, please call your pharmacy.

## 2018-03-28 DIAGNOSIS — E113211 Type 2 diabetes mellitus with mild nonproliferative diabetic retinopathy with macular edema, right eye: Secondary | ICD-10-CM | POA: Diagnosis not present

## 2018-04-11 ENCOUNTER — Other Ambulatory Visit: Payer: Self-pay | Admitting: Cardiology

## 2018-04-11 ENCOUNTER — Telehealth: Payer: Self-pay | Admitting: Cardiology

## 2018-04-11 NOTE — Telephone Encounter (Signed)
Spoke with pt wife, Will forward for dr Stanford Breed review

## 2018-04-11 NOTE — Telephone Encounter (Signed)
Ok for surgery; monitor results show sinus with PAF and atrial flutter [name]

## 2018-04-11 NOTE — Telephone Encounter (Signed)
Spoke with pt wife, Aware of dr Jacalyn Lefevre recommendations. Spoke with priscilla jackson-holmes at the transplant office. I will fax this note to her @ [phone].

## 2018-04-11 NOTE — Telephone Encounter (Signed)
New Message:    [name] called and said pt pt I scheduled to have a kidney transplant. He needs from Dr Stanford Breed, a statement saying that he is okay to have this surgery and also the results of his event monitor.

## 2018-04-16 DIAGNOSIS — N184 Chronic kidney disease, stage 4 (severe): Secondary | ICD-10-CM | POA: Diagnosis not present

## 2018-04-16 DIAGNOSIS — D631 Anemia in chronic kidney disease: Secondary | ICD-10-CM | POA: Diagnosis not present

## 2018-04-16 DIAGNOSIS — I129 Hypertensive chronic kidney disease with stage 1 through stage 4 chronic kidney disease, or unspecified chronic kidney disease: Secondary | ICD-10-CM | POA: Diagnosis not present

## 2018-04-16 DIAGNOSIS — E669 Obesity, unspecified: Secondary | ICD-10-CM | POA: Diagnosis not present

## 2018-04-16 DIAGNOSIS — N2581 Secondary hyperparathyroidism of renal origin: Secondary | ICD-10-CM | POA: Diagnosis not present

## 2018-04-16 DIAGNOSIS — M109 Gout, unspecified: Secondary | ICD-10-CM | POA: Diagnosis not present

## 2018-04-16 DIAGNOSIS — R809 Proteinuria, unspecified: Secondary | ICD-10-CM | POA: Diagnosis not present

## 2018-04-16 DIAGNOSIS — E0822 Diabetes mellitus due to underlying condition with diabetic chronic kidney disease: Secondary | ICD-10-CM | POA: Diagnosis not present

## 2018-04-28 ENCOUNTER — Encounter: Payer: Self-pay | Admitting: Cardiology

## 2018-04-29 ENCOUNTER — Encounter: Payer: Self-pay | Admitting: Cardiology

## 2018-04-29 ENCOUNTER — Telehealth: Payer: Self-pay | Admitting: Cardiology

## 2018-04-29 DIAGNOSIS — I480 Paroxysmal atrial fibrillation: Secondary | ICD-10-CM | POA: Diagnosis not present

## 2018-04-29 NOTE — Telephone Encounter (Signed)
New message    Pt c/o medication issue:  1. Name of Medication: apixaban (ELIQUIS) 5 MG TABS tablet  2. How are you currently taking this medication (dosage and times per day)?   3. Are you having a reaction (difficulty breathing--STAT)? No  4. What is your medication issue? Too costly, $395 after insurance/ 30 days. Also advised spouse that a MyChart message had been sent

## 2018-04-29 NOTE — Telephone Encounter (Signed)
Left message for wife, I sent through my chart the patient assistance number for them to see if he would qualify. If they can not get assistance from the company they need to let me know.

## 2018-04-30 ENCOUNTER — Telehealth: Payer: Self-pay | Admitting: *Deleted

## 2018-04-30 DIAGNOSIS — I480 Paroxysmal atrial fibrillation: Secondary | ICD-10-CM

## 2018-04-30 DIAGNOSIS — I5032 Chronic diastolic (congestive) heart failure: Secondary | ICD-10-CM

## 2018-04-30 LAB — BASIC METABOLIC PANEL
BUN/Creatinine Ratio: 28 (calc) — ABNORMAL HIGH (ref 6–22)
BUN: 88 mg/dL — ABNORMAL HIGH (ref 7–25)
CALCIUM: 9.4 mg/dL (ref 8.6–10.3)
CHLORIDE: 94 mmol/L — AB (ref 98–110)
CO2: 29 mmol/L (ref 20–32)
CREATININE: 3.17 mg/dL — AB (ref 0.70–1.18)
Glucose, Bld: 166 mg/dL — ABNORMAL HIGH (ref 65–99)
POTASSIUM: 3.3 mmol/L — AB (ref 3.5–5.3)
Sodium: 136 mmol/L (ref 135–146)

## 2018-04-30 LAB — CBC
HEMATOCRIT: 37.1 % — AB (ref 38.5–50.0)
Hemoglobin: 13 g/dL — ABNORMAL LOW (ref 13.2–17.1)
MCH: 30.3 pg (ref 27.0–33.0)
MCHC: 35 g/dL (ref 32.0–36.0)
MCV: 86.5 fL (ref 80.0–100.0)
MPV: 11.8 fL (ref 7.5–12.5)
Platelets: 222 10*3/uL (ref 140–400)
RBC: 4.29 10*6/uL (ref 4.20–5.80)
RDW: 15.3 % — AB (ref 11.0–15.0)
WBC: 8.2 10*3/uL (ref 3.8–10.8)

## 2018-04-30 MED ORDER — APIXABAN 2.5 MG PO TABS: 2.5000 mg | ORAL_TABLET | Freq: Two times a day (BID) | ORAL | Status: DC

## 2018-04-30 MED ORDER — POTASSIUM CHLORIDE CRYS ER 20 MEQ PO TBCR: 20.0000 meq | EXTENDED_RELEASE_TABLET | Freq: Every day | ORAL | 3 refills | Status: DC

## 2018-04-30 NOTE — Telephone Encounter (Signed)
Labs reviewed with dr berry and pharm md, due to elevated renal function the patient was instructed to decrease eliquis to 2.5 mg twice daily, he will start potassium chloride 20 meq once daily with a bmp in one week. When talking to the patient he reports he take the metolazone about every other day due to weight gain of 1.5 lbs. Advised the patient to take the metolazone for weight gain of 3 lbs instead. Patient denies any edema or SOB.

## 2018-05-02 DIAGNOSIS — E113213 Type 2 diabetes mellitus with mild nonproliferative diabetic retinopathy with macular edema, bilateral: Secondary | ICD-10-CM | POA: Diagnosis not present

## 2018-05-02 DIAGNOSIS — E113211 Type 2 diabetes mellitus with mild nonproliferative diabetic retinopathy with macular edema, right eye: Secondary | ICD-10-CM | POA: Diagnosis not present

## 2018-05-07 ENCOUNTER — Telehealth: Payer: Self-pay

## 2018-05-07 ENCOUNTER — Other Ambulatory Visit: Payer: Self-pay | Admitting: Cardiology

## 2018-05-07 DIAGNOSIS — I5032 Chronic diastolic (congestive) heart failure: Secondary | ICD-10-CM | POA: Diagnosis not present

## 2018-05-07 NOTE — Telephone Encounter (Signed)
[name] called and states Louis [name] needs a letter to get out of jury duty. Date is 05/20/18 and juror # [phone]. Please advise.

## 2018-05-07 NOTE — Telephone Encounter (Signed)
Okay for jury duty letter.

## 2018-05-08 LAB — BASIC METABOLIC PANEL WITH GFR
BUN/Creatinine Ratio: 29 (calc) — ABNORMAL HIGH (ref 6–22)
BUN: 75 mg/dL — AB (ref 7–25)
CALCIUM: 9.4 mg/dL (ref 8.6–10.3)
CHLORIDE: 101 mmol/L (ref 98–110)
CO2: 29 mmol/L (ref 20–32)
CREATININE: 2.58 mg/dL — AB (ref 0.70–1.18)
GFR, EST AFRICAN AMERICAN: 28 mL/min/{1.73_m2} — AB (ref >=60)
GFR, Est Non African American: 24 mL/min/{1.73_m2} — ABNORMAL LOW (ref >=60)
Glucose, Bld: 120 mg/dL — ABNORMAL HIGH (ref 65–99)
Potassium: 3.8 mmol/L (ref 3.5–5.3)
Sodium: 139 mmol/L (ref 135–146)

## 2018-05-08 NOTE — Telephone Encounter (Signed)
Patient's wife advised

## 2018-05-09 DIAGNOSIS — E113212 Type 2 diabetes mellitus with mild nonproliferative diabetic retinopathy with macular edema, left eye: Secondary | ICD-10-CM | POA: Diagnosis not present

## 2018-05-10 ENCOUNTER — Telehealth: Payer: Self-pay | Admitting: Cardiology

## 2018-05-10 NOTE — Telephone Encounter (Signed)
New message:       [name] is calling to see if lab results are back from [place] for his eliquis and creatinine.

## 2018-05-10 NOTE — Telephone Encounter (Signed)
Spoke with Pt 's wife and advised of result and Dr. Jacalyn Lefevre recommendation. Verbalized understanding with no further questions.      Result Notes for BASIC METABOLIC PANEL WITH GFR   Notes recorded by Cristopher Estimable, RN on 05/08/2018 at 2:45 PM EDT Results released to my chart ------  Notes recorded by Lelon Perla, MD on 05/08/2018 at 9:13 AM EDT No change in meds [name]

## 2018-05-13 NOTE — Progress Notes (Signed)
HPI: FU CAD and diastolic CHF and evaluate new atrial fibrillation. 4/12 ABIs were normal. Patient had cardiac catheterization in April of 2012 that revealed 50% distal LM stenosis. LAD - mild diffuse 20% disease proximally. The LAD is occluded in the mid segment after the second diagonal and septal branch. It fills through very faint collaterals and overall appears to be diffusely diseased. First diagonal is a small-sized branch with a 60% ostial stenosis. Second diagonal is a relatively large size branch with 40-50% proximal disease. The third diagonal appears to have 2 branches and subtotally occluded. LCX with diffuse 40% disease in the mid segment before the takeoff of OM-3. RCA with 30% proximal discrete stenosis. There is also 20% mid stenosis. There is a 30% distal stenosis before the bifurcation. Medical therapy recommended. Patient admitted March 2018 with syncope. This apparently was felt to be defecation syncope. Toprol decreased due to bradycardia. Carotid dopplers 4/18 showed less than 50 stenosis bilaterally. Echo 2/19 showed hyperdynamic LV function, moderate LVH, mild diastolic dysfunction, mild LAE. Pt had dobutamine echo 2/19 prior to renal transplant; no ischemia; developed atrial fibrillation in recovery. Monitor 4/19 showed recurrent atrial fibrillation. Since last seen,the patient denies any dyspnea on exertion, orthopnea, PND, pedal edema, palpitations, syncope or chest pain.   Current Outpatient Medications  Medication Sig Dispense Refill  . allopurinol (ZYLOPRIM) 100 MG tablet TAKE 3 TABLETS BY MOUTH 2 TIMES A DAY 540 tablet 1  . AMBULATORY NON FORMULARY MEDICATION Rollator walker with seat. Dx: M25.561, M54.12, M10.00 1 each 0  . apixaban (ELIQUIS) 5 MG TABS tablet Take 5 mg by mouth 2 (two) times daily.    Marland Kitchen atorvastatin (LIPITOR) 80 MG tablet Take 1 tablet (80 mg total) by mouth daily. 90 tablet 3  . B-D INS SYR ULTRAFINE 1CC/31G 31G X 5/16" 1 ML MISC     . cloNIDine  (CATAPRES) 0.2 MG tablet Take 1 tablet (0.2 mg total) by mouth daily. 90 tablet 2  . furosemide (LASIX) 80 MG tablet Take 80 mg by mouth 2 (two) times daily.     Marland Kitchen gabapentin (NEURONTIN) 300 MG capsule TAKE 1 CAPSULE (300 MG TOTAL) BY MOUTH 2 (TWO) TIMES DAILY. 180 capsule 3  . hydrALAZINE (APRESOLINE) 100 MG tablet Take 100 mg by mouth 2 (two) times daily.     . isosorbide mononitrate (ISMO,MONOKET) 20 MG tablet TAKE 1 TABLET (20 MG TOTAL) BY MOUTH 2 (TWO) TIMES DAILY AT 10 AM AND 5 PM. 180 tablet 2  . metolazone (ZAROXOLYN) 5 MG tablet Take 5 mg by mouth daily as needed (swelling).     . metoprolol succinate (TOPROL-XL) 50 MG 24 hr tablet Take 1 tablet (50 mg total) by mouth daily. 90 tablet 3  . nitroGLYCERIN (NITROSTAT) 0.4 MG SL tablet Place 1 tablet (0.4 mg total) under the tongue every 5 (five) minutes as needed for chest pain. 25 tablet 0  . NOVOLIN N 100 UNIT/ML injection Inject 50 Units into the skin 2 (two) times daily before a meal. Novolin 50 units twice a day    . NOVOLIN R 100 UNIT/ML injection Injects 15-20 units TID with meals using a sliding scale  5  . ONE TOUCH ULTRA TEST test strip USE TO CHECK BLOOD SUGAR 4 TIMES A DAY DX E11.65  1  . potassium chloride SA (K-DUR,KLOR-CON) 20 MEQ tablet Take 1 tablet (20 mEq total) by mouth daily. 90 tablet 3   Current Facility-Administered Medications  Medication Dose Route Frequency Provider  Last Rate Last Dose  . 0.9 %  sodium chloride infusion  500 mL Intravenous Continuous Gatha Mayer, MD         Past Medical History:  Diagnosis Date  . Brittle bones    per pt, has soft bones in right foot/wears boot cast!  . CAD (coronary artery disease)   . Cataract    Bil/ surg scheduled for right eye 01/18/17/ left eye 02/08/17  . CHF (congestive heart failure) ([place]) 2015  . Diabetes mellitus   . Heart failure, diastolic ([place])   . Hyperlipidemia   . Hypertension   . Macular edema 2014  . OSA on CPAP   . Personal history of colonic  polyps - adenomas 01/28/2014  . Renal insufficiency   . Shortness of breath dyspnea   . Syncope and collapse     Past Surgical History:  Procedure Laterality Date  . CARPAL TUNNEL RELEASE     left hand  . COLONOSCOPY    . KNEE ARTHROSCOPY Right 09/13/2016   [facility], Dr. Dorna Leitz  . PILONIDAL CYST EXCISION      Social History   Socioeconomic History  . Marital status: Married    Spouse name: Not on file  . Number of children: 5  . Years of education: Not on file  . Highest education level: Not on file  Occupational History  . Occupation: Office manager  . Financial resource strain: Not on file  . Food insecurity:    Worry: Not on file    Inability: Not on file  . Transportation needs:    Medical: Not on file    Non-medical: Not on file  Tobacco Use  . Smoking status: Former Smoker    Packs/day: 0.50    Years: 20.00    Pack years: 10.00    Types: Cigarettes    Last attempt to quit: 03/06/1977    Years since quitting: 41.2  . Smokeless tobacco: Never Used  Substance and Sexual Activity  . Alcohol use: No  . Drug use: No  . Sexual activity: Never  Lifestyle  . Physical activity:    Days per week: Not on file    Minutes per session: Not on file  . Stress: Not on file  Relationships  . Social connections:    Talks on phone: Not on file    Gets together: Not on file    Attends religious service: Not on file    Active member of club or organization: Not on file    Attends meetings of clubs or organizations: Not on file    Relationship status: Not on file  . Intimate partner violence:    Fear of current or ex partner: Not on file    Emotionally abused: Not on file    Physically abused: Not on file    Forced sexual activity: Not on file  Other Topics Concern  . Not on file  Social History Narrative  . Not on file    Family History  Problem Relation Age of Onset  . Diabetes Mother   . Kidney disease Mother   . Diabetes Father   .  Coronary artery disease Father   . Diabetes Brother   . Diabetes Sister   . Cancer Maternal Uncle   . Diabetes Maternal Grandmother   . Cancer Paternal Grandmother   . Heart attack Paternal Grandfather   . Colon cancer Neg Hx     ROS: no fevers or chills, productive cough, hemoptysis,  dysphasia, odynophagia, melena, hematochezia, dysuria, hematuria, rash, seizure activity, orthopnea, PND, pedal edema, claudication. Remaining systems are negative.  Physical Exam: Well-developed well-nourished in no acute distress.  Skin is warm and dry.  HEENT is normal.  Neck is supple.  Chest is clear to auscultation with normal expansion.  Cardiovascular exam is regular rate and rhythm.  Abdominal exam nontender or distended. No masses palpated. Extremities show no edema. neuro grossly intact  A/P  1 paroxysmal atrial fibrillation-patient appears to be in sinus rhythm on examination today.  Continue Toprol for rate control if atrial fibrillation recurs.  Continue apixaban 5 mg twice daily.  2 chronic diastolic congestive heart failure-patient appears euvolemic today.  Continue present dose of diuretic.  We discussed fluid restriction and low-sodium diet.  3 Coronary artery disease-patient doing well with no chest pain.  Continue medical therapy with statin.  He is not on aspirin given need for anticoagulation.  4 hypertension-blood pressure is controlled today.  Continue present medications.  5 hyperlipidemia-continue statin.  [name], MD

## 2018-05-17 ENCOUNTER — Telehealth: Payer: Self-pay | Admitting: Family Medicine

## 2018-05-17 DIAGNOSIS — M1000 Idiopathic gout, unspecified site: Secondary | ICD-10-CM

## 2018-05-17 DIAGNOSIS — M25561 Pain in right knee: Secondary | ICD-10-CM

## 2018-05-17 DIAGNOSIS — M5412 Radiculopathy, cervical region: Secondary | ICD-10-CM

## 2018-05-17 MED ORDER — AMBULATORY NON FORMULARY MEDICATION: 0 refills | Status: DC

## 2018-05-17 NOTE — Telephone Encounter (Signed)
Wife advised. Rx upfront.

## 2018-05-17 NOTE — Telephone Encounter (Signed)
Printed and signed.  

## 2018-05-17 NOTE — Telephone Encounter (Signed)
[name] called to request an Rx for Rollator walker. [place] feel [place] will be [place]. Rx pended for PCP approval.

## 2018-05-20 ENCOUNTER — Telehealth: Payer: Self-pay | Admitting: Cardiology

## 2018-05-20 NOTE — Telephone Encounter (Signed)
Spoke with pt, they will not qualify for any assistance program. Warfarin explained to the patient, they have an appointment to see [place] Wednesday and will discuss with dr Stanford Breed then. Samples provided.

## 2018-05-20 NOTE — Telephone Encounter (Signed)
New Message:    Pt needs another medicine in the place of Eliquis. Pt can not afford to take it, he could not get any help with his Eliquis.

## 2018-05-22 ENCOUNTER — Ambulatory Visit (INDEPENDENT_AMBULATORY_CARE_PROVIDER_SITE_OTHER): Payer: Medicare Other | Admitting: Cardiology

## 2018-05-22 ENCOUNTER — Encounter: Payer: Self-pay | Admitting: Cardiology

## 2018-05-22 VITALS — BP 127/60 | HR 60 | Ht 68.0 in | Wt 238.8 lb

## 2018-05-22 DIAGNOSIS — I2510 Atherosclerotic heart disease of native coronary artery without angina pectoris: Secondary | ICD-10-CM | POA: Diagnosis not present

## 2018-05-22 DIAGNOSIS — E7800 Pure hypercholesterolemia, unspecified: Secondary | ICD-10-CM | POA: Diagnosis not present

## 2018-05-22 DIAGNOSIS — I480 Paroxysmal atrial fibrillation: Secondary | ICD-10-CM | POA: Diagnosis not present

## 2018-05-22 DIAGNOSIS — I10 Essential (primary) hypertension: Secondary | ICD-10-CM | POA: Diagnosis not present

## 2018-05-22 NOTE — Addendum Note (Signed)
Addended by: Cristopher Estimable on: 05/22/2018 02:53 PM   Modules accepted: Orders

## 2018-05-22 NOTE — Patient Instructions (Signed)
Your physician wants you to follow-up in: 6 MONTHS WITH DR CRENSHAW You will receive a reminder letter in the mail two months in advance. If you don't receive a letter, please call our office to schedule the follow-up appointment.   If you need a refill on your cardiac medications before your next appointment, please call your pharmacy.  

## 2018-05-28 DIAGNOSIS — S0501XA Injury of conjunctiva and corneal abrasion without foreign body, right eye, initial encounter: Secondary | ICD-10-CM | POA: Diagnosis not present

## 2018-05-30 ENCOUNTER — Telehealth: Payer: Self-pay

## 2018-05-30 MED ORDER — AMBULATORY NON FORMULARY MEDICATION: 1 refills | Status: DC

## 2018-05-30 NOTE — Telephone Encounter (Signed)
Prescription signed and placed in fax box I looked through the chart and did not find a sleep study report

## 2018-05-30 NOTE — Telephone Encounter (Signed)
Need a CPAP order faxed to [phone]. Please advise.

## 2018-05-31 NOTE — Telephone Encounter (Signed)
Faxed order. His wife has already sent the sleep study.

## 2018-06-06 DIAGNOSIS — E113211 Type 2 diabetes mellitus with mild nonproliferative diabetic retinopathy with macular edema, right eye: Secondary | ICD-10-CM | POA: Diagnosis not present

## 2018-06-06 DIAGNOSIS — E113213 Type 2 diabetes mellitus with mild nonproliferative diabetic retinopathy with macular edema, bilateral: Secondary | ICD-10-CM | POA: Diagnosis not present

## 2018-06-10 ENCOUNTER — Encounter: Payer: Self-pay | Admitting: Family Medicine

## 2018-06-10 ENCOUNTER — Ambulatory Visit (INDEPENDENT_AMBULATORY_CARE_PROVIDER_SITE_OTHER): Payer: Medicare Other | Admitting: Family Medicine

## 2018-06-10 VITALS — BP 119/48 | HR 56 | Ht 68.0 in | Wt 236.0 lb

## 2018-06-10 DIAGNOSIS — I2510 Atherosclerotic heart disease of native coronary artery without angina pectoris: Secondary | ICD-10-CM

## 2018-06-10 DIAGNOSIS — Z7682 Awaiting organ transplant status: Secondary | ICD-10-CM | POA: Diagnosis not present

## 2018-06-10 DIAGNOSIS — E113213 Type 2 diabetes mellitus with mild nonproliferative diabetic retinopathy with macular edema, bilateral: Secondary | ICD-10-CM

## 2018-06-10 DIAGNOSIS — M1000 Idiopathic gout, unspecified site: Secondary | ICD-10-CM

## 2018-06-10 DIAGNOSIS — I10 Essential (primary) hypertension: Secondary | ICD-10-CM | POA: Diagnosis not present

## 2018-06-10 DIAGNOSIS — N184 Chronic kidney disease, stage 4 (severe): Secondary | ICD-10-CM

## 2018-06-10 NOTE — Progress Notes (Signed)
Subjective:    CC: BP and gout  HPI:  Hypertension- Pt denies chest pain, SOB, dizziness, or heart palpitations.  Taking meds as directed w/o problems.  Denies medication side effects.   Lab Results  Component Value Date   CHOL 116 11/27/2017   HDL 31 (A) 11/27/2017   LDLCALC 66 11/27/2017   TRIG 135 11/27/2017   CHOLHDL 3.9 04/11/2016       Gout -last years episode a year ago looked great at 4.6.  He is done really well overall except for may be at the flare in his right thumb.  Started a few weeks ago he got painful red and swollen.  He is been trying to get a little bit more red meat that he is a cut out significantly previously. Lab Results  Component Value Date   LABURIC 4.6 06/12/2017   CKD 4-they are hoping to get him a transplant before he starts dialysis.  They have a donor who is starting work-up.  Past medical history, Surgical history, Family history not pertinant except as noted below, Social history, Allergies, and medications have been entered into the medical record, reviewed, and corrections made.   Review of Systems: No fevers, chills, night sweats, weight loss, chest pain, or shortness of breath.   Objective:    General: Well Developed, well nourished, and in no acute distress.  Neuro: Alert and oriented x3, extra-ocular muscles intact, sensation grossly intact.  HEENT: Normocephalic, atraumatic  Skin: Warm and dry, no rashes. Cardiac: Regular rate and rhythm, no murmurs rubs or gallops, no lower extremity edema.  Respiratory: Clear to auscultation bilaterally. Not using accessory muscles, speaking in full sentences.   Impression and Recommendations:    Gout -only one flare in the last year.  Plan to recheck uric acid.  He can take the lab slip with him to his nephrology appointment later this week.  HTN - Well controlled. Continue current regimen. Follow up in  6 months.   CKD 4 -planning for transplant likely in the next 12 months.  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  Please see care everywhere.  He did have some questions about the watchman.  He is currently on Eliquis for atrial fibrillation.  That he is in rhythm on exam today.  I will get him some information and send it to him.  Discussed need for shingles vaccine.  Information provided he will need to get this through his Medicare part D at the pharmacy.

## 2018-06-11 ENCOUNTER — Encounter: Payer: Self-pay | Admitting: Cardiology

## 2018-06-12 ENCOUNTER — Encounter: Payer: Self-pay | Admitting: Family Medicine

## 2018-06-12 ENCOUNTER — Encounter: Payer: Self-pay | Admitting: Cardiology

## 2018-06-12 DIAGNOSIS — I129 Hypertensive chronic kidney disease with stage 1 through stage 4 chronic kidney disease, or unspecified chronic kidney disease: Secondary | ICD-10-CM | POA: Diagnosis not present

## 2018-06-12 DIAGNOSIS — N2581 Secondary hyperparathyroidism of renal origin: Secondary | ICD-10-CM | POA: Diagnosis not present

## 2018-06-12 DIAGNOSIS — N184 Chronic kidney disease, stage 4 (severe): Secondary | ICD-10-CM | POA: Diagnosis not present

## 2018-06-12 DIAGNOSIS — E669 Obesity, unspecified: Secondary | ICD-10-CM | POA: Diagnosis not present

## 2018-06-12 DIAGNOSIS — R809 Proteinuria, unspecified: Secondary | ICD-10-CM | POA: Diagnosis not present

## 2018-06-12 DIAGNOSIS — D631 Anemia in chronic kidney disease: Secondary | ICD-10-CM | POA: Diagnosis not present

## 2018-06-12 DIAGNOSIS — M109 Gout, unspecified: Secondary | ICD-10-CM | POA: Diagnosis not present

## 2018-06-12 DIAGNOSIS — E0822 Diabetes mellitus due to underlying condition with diabetic chronic kidney disease: Secondary | ICD-10-CM | POA: Diagnosis not present

## 2018-06-21 DIAGNOSIS — E1122 Type 2 diabetes mellitus with diabetic chronic kidney disease: Secondary | ICD-10-CM | POA: Diagnosis not present

## 2018-06-21 DIAGNOSIS — E1165 Type 2 diabetes mellitus with hyperglycemia: Secondary | ICD-10-CM | POA: Diagnosis not present

## 2018-06-21 DIAGNOSIS — N184 Chronic kidney disease, stage 4 (severe): Secondary | ICD-10-CM | POA: Diagnosis not present

## 2018-06-21 DIAGNOSIS — Z794 Long term (current) use of insulin: Secondary | ICD-10-CM | POA: Diagnosis not present

## 2018-06-21 DIAGNOSIS — E11610 Type 2 diabetes mellitus with diabetic neuropathic arthropathy: Secondary | ICD-10-CM | POA: Diagnosis not present

## 2018-06-21 DIAGNOSIS — E113519 Type 2 diabetes mellitus with proliferative diabetic retinopathy with macular edema, unspecified eye: Secondary | ICD-10-CM | POA: Diagnosis not present

## 2018-06-27 DIAGNOSIS — H35353 Cystoid macular degeneration, bilateral: Secondary | ICD-10-CM | POA: Diagnosis not present

## 2018-06-27 DIAGNOSIS — E113212 Type 2 diabetes mellitus with mild nonproliferative diabetic retinopathy with macular edema, left eye: Secondary | ICD-10-CM | POA: Diagnosis not present

## 2018-06-27 DIAGNOSIS — E113213 Type 2 diabetes mellitus with mild nonproliferative diabetic retinopathy with macular edema, bilateral: Secondary | ICD-10-CM | POA: Diagnosis not present

## 2018-06-29 ENCOUNTER — Other Ambulatory Visit: Payer: Self-pay | Admitting: Sports Medicine

## 2018-07-04 DIAGNOSIS — E113211 Type 2 diabetes mellitus with mild nonproliferative diabetic retinopathy with macular edema, right eye: Secondary | ICD-10-CM | POA: Diagnosis not present

## 2018-08-01 DIAGNOSIS — E113212 Type 2 diabetes mellitus with mild nonproliferative diabetic retinopathy with macular edema, left eye: Secondary | ICD-10-CM | POA: Diagnosis not present

## 2018-08-01 DIAGNOSIS — E113213 Type 2 diabetes mellitus with mild nonproliferative diabetic retinopathy with macular edema, bilateral: Secondary | ICD-10-CM | POA: Diagnosis not present

## 2018-08-14 DIAGNOSIS — D631 Anemia in chronic kidney disease: Secondary | ICD-10-CM | POA: Diagnosis not present

## 2018-08-14 DIAGNOSIS — I129 Hypertensive chronic kidney disease with stage 1 through stage 4 chronic kidney disease, or unspecified chronic kidney disease: Secondary | ICD-10-CM | POA: Diagnosis not present

## 2018-08-14 DIAGNOSIS — E669 Obesity, unspecified: Secondary | ICD-10-CM | POA: Diagnosis not present

## 2018-08-14 DIAGNOSIS — N2581 Secondary hyperparathyroidism of renal origin: Secondary | ICD-10-CM | POA: Diagnosis not present

## 2018-08-14 DIAGNOSIS — N184 Chronic kidney disease, stage 4 (severe): Secondary | ICD-10-CM | POA: Diagnosis not present

## 2018-08-14 DIAGNOSIS — M109 Gout, unspecified: Secondary | ICD-10-CM | POA: Diagnosis not present

## 2018-08-14 DIAGNOSIS — R809 Proteinuria, unspecified: Secondary | ICD-10-CM | POA: Diagnosis not present

## 2018-08-14 DIAGNOSIS — E0822 Diabetes mellitus due to underlying condition with diabetic chronic kidney disease: Secondary | ICD-10-CM | POA: Diagnosis not present

## 2018-08-15 DIAGNOSIS — N186 End stage renal disease: Secondary | ICD-10-CM | POA: Diagnosis not present

## 2018-08-15 DIAGNOSIS — R351 Nocturia: Secondary | ICD-10-CM | POA: Diagnosis not present

## 2018-08-15 DIAGNOSIS — R972 Elevated prostate specific antigen [PSA]: Secondary | ICD-10-CM | POA: Diagnosis not present

## 2018-08-15 DIAGNOSIS — Z01818 Encounter for other preprocedural examination: Secondary | ICD-10-CM | POA: Diagnosis not present

## 2018-08-16 ENCOUNTER — Telehealth: Payer: Self-pay | Admitting: Cardiology

## 2018-08-16 NOTE — Telephone Encounter (Signed)
Received records from [facility] on 08/16/18, Appt 10/23/18 in [place] @ 2:40PM. [place]

## 2018-08-22 DIAGNOSIS — E113211 Type 2 diabetes mellitus with mild nonproliferative diabetic retinopathy with macular edema, right eye: Secondary | ICD-10-CM | POA: Diagnosis not present

## 2018-08-28 ENCOUNTER — Encounter: Payer: Self-pay | Admitting: Cardiology

## 2018-08-28 ENCOUNTER — Ambulatory Visit (INDEPENDENT_AMBULATORY_CARE_PROVIDER_SITE_OTHER): Payer: Medicare Other | Admitting: Cardiology

## 2018-08-28 VITALS — BP 126/56 | HR 67 | Ht 68.0 in | Wt 242.1 lb

## 2018-08-28 DIAGNOSIS — I2510 Atherosclerotic heart disease of native coronary artery without angina pectoris: Secondary | ICD-10-CM

## 2018-08-28 DIAGNOSIS — Z01810 Encounter for preprocedural cardiovascular examination: Secondary | ICD-10-CM | POA: Diagnosis not present

## 2018-08-28 DIAGNOSIS — E7800 Pure hypercholesterolemia, unspecified: Secondary | ICD-10-CM | POA: Diagnosis not present

## 2018-08-28 DIAGNOSIS — I10 Essential (primary) hypertension: Secondary | ICD-10-CM | POA: Diagnosis not present

## 2018-08-28 NOTE — Progress Notes (Addendum)
HPI: FU CAD and diastolic CHFand evaluate new atrial fibrillation. 4/12ABIs were normal. Patient had cardiac catheterization in April of 2012 that revealed 50% distal LM stenosis. LAD - mild diffuse 20% disease proximally. The LAD is occluded in the mid segment after the second diagonal and septal branch. It fills through very faint collaterals and overall appears to be diffusely diseased. First diagonal is a small-sized branch with a 60% ostial stenosis. Second diagonal is a relatively large size branch with 40-50% proximal disease. The third diagonal appears to have 2 branches and subtotally occluded. LCX with diffuse 40% disease in the mid segment before the takeoff of OM-3. RCA with 30% proximal discrete stenosis. There is also 20% mid stenosis. There is a 30% distal stenosis before the bifurcation. Medical therapy recommended. Patient admitted March 2018 with syncope. This apparently was felt to be defecation syncope. Toprol decreased due to bradycardia.Carotid dopplers 4/18 showed less than 50 stenosis bilaterally. Echo 2/19 showed hyperdynamic LV function, moderate LVH, mild diastolic dysfunction, mild LAE. Pt had dobutamine echo 2/19 prior to renal transplant; no ischemia; developed atrial fibrillation in recovery. Monitor 4/19 showed recurrent atrial fibrillation. Since lastseen,patient denies dyspnea, chest pain, palpitations or syncope.  Mild pedal edema.  Current Outpatient Medications  Medication Sig Dispense Refill  . allopurinol (ZYLOPRIM) 100 MG tablet TAKE 3 TABLETS BY MOUTH 2 TIMES A DAY 540 tablet 1  . AMBULATORY NON FORMULARY MEDICATION Rollator walker with seat. Dx: M25.561, M54.12, M10.00 1 each 0  . AMBULATORY NON FORMULARY MEDICATION Continuous positive airway pressure (CPAP) device: Auto titrate minimum 4 cm H20 to maximum of 20 cm H2O with pressure. Please provide all supplemental supplies as needed. Fax to: [phone] 1 Units 1  . apixaban (ELIQUIS) 5 MG TABS tablet  Take 5 mg by mouth 2 (two) times daily.    Marland Kitchen atorvastatin (LIPITOR) 80 MG tablet Take 1 tablet (80 mg total) by mouth daily. 90 tablet 3  . B-D INS SYR ULTRAFINE 1CC/31G 31G X 5/16" 1 ML MISC     . Cholecalciferol (VITAMIN D3) 5000 units CAPS Take 5,000 Units by mouth daily.    . cloNIDine (CATAPRES) 0.2 MG tablet Take 1 tablet (0.2 mg total) by mouth daily. (Patient taking differently: Take 0.2 mg by mouth daily. Pt taking 1/2 tablet daily) 90 tablet 2  . furosemide (LASIX) 80 MG tablet Take 80 mg by mouth 2 (two) times daily.     Marland Kitchen gabapentin (NEURONTIN) 300 MG capsule TAKE 1 CAPSULE (300 MG TOTAL) BY MOUTH 2 (TWO) TIMES DAILY. 180 capsule 3  . hydrALAZINE (APRESOLINE) 100 MG tablet Take 100 mg by mouth 2 (two) times daily.     . isosorbide mononitrate (ISMO,MONOKET) 20 MG tablet TAKE 1 TABLET (20 MG TOTAL) BY MOUTH 2 (TWO) TIMES DAILY AT 10 AM AND 5 PM. 180 tablet 2  . metolazone (ZAROXOLYN) 5 MG tablet Take 5 mg by mouth daily as needed (swelling).     . metoprolol succinate (TOPROL-XL) 50 MG 24 hr tablet Take 1 tablet (50 mg total) by mouth daily. 90 tablet 3  . nitroGLYCERIN (NITROSTAT) 0.4 MG SL tablet Place 1 tablet (0.4 mg total) under the tongue every 5 (five) minutes as needed for chest pain. 25 tablet 0  . NOVOLIN N 100 UNIT/ML injection Inject 50 Units into the skin 2 (two) times daily before a meal. Novolin 50 units twice a day    . NOVOLIN R 100 UNIT/ML injection Injects 15-20 units TID with  meals using a sliding scale  5  . ONE TOUCH ULTRA TEST test strip USE TO CHECK BLOOD SUGAR 4 TIMES A DAY DX E11.65  1  . potassium chloride SA (K-DUR,KLOR-CON) 20 MEQ tablet Take 1 tablet (20 mEq total) by mouth daily. 90 tablet 3   Current Facility-Administered Medications  Medication Dose Route Frequency Provider Last Rate Last Dose  . 0.9 %  sodium chloride infusion  500 mL Intravenous Continuous Gatha Mayer, MD         Past Medical History:  Diagnosis Date  . Brittle bones    per  pt, has soft bones in right foot/wears boot cast!  . CAD (coronary artery disease)   . Cataract    Bil/ surg scheduled for right eye 01/18/17/ left eye 02/08/17  . CHF (congestive heart failure) ([place]) 2015  . Diabetes mellitus   . Heart failure, diastolic ([place])   . Hyperlipidemia   . Hypertension   . Macular edema 2014  . OSA on CPAP   . Personal history of colonic polyps - adenomas 01/28/2014  . Renal insufficiency   . Shortness of breath dyspnea   . Syncope and collapse     Past Surgical History:  Procedure Laterality Date  . CARPAL TUNNEL RELEASE     left hand  . COLONOSCOPY    . KNEE ARTHROSCOPY Right 09/13/2016   [facility], Dr. Dorna Leitz  . PILONIDAL CYST EXCISION      Social History   Socioeconomic History  . Marital status: Married    Spouse name: Not on file  . Number of children: 5  . Years of education: Not on file  . Highest education level: Not on file  Occupational History  . Occupation: Office manager  . Financial resource strain: Not on file  . Food insecurity:    Worry: Not on file    Inability: Not on file  . Transportation needs:    Medical: Not on file    Non-medical: Not on file  Tobacco Use  . Smoking status: Former Smoker    Packs/day: 0.50    Years: 20.00    Pack years: 10.00    Types: Cigarettes    Last attempt to quit: 03/06/1977    Years since quitting: 41.5  . Smokeless tobacco: Never Used  Substance and Sexual Activity  . Alcohol use: No  . Drug use: No  . Sexual activity: Never  Lifestyle  . Physical activity:    Days per week: Not on file    Minutes per session: Not on file  . Stress: Not on file  Relationships  . Social connections:    Talks on phone: Not on file    Gets together: Not on file    Attends religious service: Not on file    Active member of club or organization: Not on file    Attends meetings of clubs or organizations: Not on file    Relationship status: Not on file  . Intimate  partner violence:    Fear of current or ex partner: Not on file    Emotionally abused: Not on file    Physically abused: Not on file    Forced sexual activity: Not on file  Other Topics Concern  . Not on file  Social History Narrative  . Not on file    Family History  Problem Relation Age of Onset  . Diabetes Mother   . Kidney disease Mother   . Diabetes Father   .  Coronary artery disease Father   . Diabetes Brother   . Diabetes Sister   . Cancer Maternal Uncle   . Diabetes Maternal Grandmother   . Cancer Paternal Grandmother   . Heart attack Paternal Grandfather   . Colon cancer Neg Hx     ROS: no fevers or chills, productive cough, hemoptysis, dysphasia, odynophagia, melena, hematochezia, dysuria, hematuria, rash, seizure activity, orthopnea, PND, pedal edema, claudication. Remaining systems are negative.  Physical Exam: Well-developed well-nourished in no acute distress.  Skin is warm and dry.  HEENT is normal.  Neck is supple.  Chest is clear to auscultation with normal expansion.  Cardiovascular exam is regular rate and rhythm. 2/6 systolic murmur Abdominal exam nontender or distended. No masses palpated. Extremities show 1+ edema. neuro grossly intact   A/P  1 preoperative evaluation prior to renal transplant-previous dobutamine echocardiogram at [facility] February 2019 neg.  However given his history of coronary disease and multiple risk factors they would like a cardiac catheterization prior to consideration for transplant.  I will need to discuss further with the transplant team at [facility].  I would be concerned about the development of contrast nephropathy and potential need for dialysis following catheterization.  Which would also be prior to transplant.  We will need to coordinate things and he may need catheterization at [facility].  Note I did discussed the risks and benefits of catheterization including myocardial infarction, CVA and  death.  I also discussed the risk of contrast nephropathy including dialysis.  2 paroxysmal atrial fibrillation-patient is in sinus rhythm on examination today.  We will continue with present dose of Toprol for rate control if atrial fibrillation recurs.  Continue apixaban at present dose.  Hold for 3 days prior to catheterization.  3 chronic diastolic congestive heart failure-continue present dose of diuretics.  He is not symptomatic at present.  Not significantly volume overloaded.  4 coronary artery disease-continue statin.  He is not on aspirin given need for anticoagulation.  5 hypertension-blood pressure is controlled.  Continue present medications.  6 hyperlipidemia-continue statin.  [name], MD

## 2018-08-28 NOTE — Patient Instructions (Signed)
DR CRENSHAW WOULD LIKE TO SEE YOU BACK IN 6 MONTHS.

## 2018-08-29 ENCOUNTER — Telehealth: Payer: Self-pay | Admitting: *Deleted

## 2018-08-29 NOTE — Telephone Encounter (Signed)
Dr Stanford Breed spoke with the transplant team at [facility]. The surgeon is going to give dr Stanford Breed a call to discuss the plan regarding getting the patient a cath and other services that are needed prior to transplant.

## 2018-09-02 NOTE — Progress Notes (Signed)
Subjective:   Louis [name] is a 72 y.o. male who presents for Medicare Annual/Subsequent preventive examination.  Review of Systems:  No ROS.  Medicare Wellness Visit. Additional risk factors are reflected in the social history.  Cardiac Risk Factors include: dyslipidemia;diabetes mellitus;advanced age (>52men, >96 women);hypertension;sedentary lifestyle Sleep patterns: Sleeps 7-8 hours. Feels sluggish when awakes. Going to get IV iron infusion tomorrow. Wakes up 1-2 times a night to go to bathroom   Home Safety/Smoke Alarms: Feels safe in home. Smoke alarms in place.  Living environment; Lives with wife in 1 story home. Stairs have handrails in place. Shower is walk in shower with grab bars in place as well as shower chair.    Male:   CCS- utd    PSA- 2 weeks ago by Dr. Diamantina Providence it ws 6.6 per patient Eye Exam- utd Lab Results  Component Value Date   PSA 1.82 02/01/2011       Objective:    Vitals: BP (!) 122/50   Pulse 68   Ht 5\' 8"  (1.727 m)   Wt 243 lb (110.2 kg)   SpO2 99%   BMI 36.95 kg/m   Body mass index is 36.95 kg/m.  Advanced Directives 09/03/2018 02/02/2017 01/25/2017 12/12/2016 12/12/2016 09/20/2016 10/05/2014  Does Patient Have a Medical Advance Directive? No No No No No Yes Yes  Type of Advance Directive - - - - - - Living will  Would patient like information on creating a medical advance directive? Yes (MAU/Ambulatory/Procedural Areas - Information given) No - Patient declined - Yes (MAU/Ambulatory/Procedural Areas - Information given) No - Patient declined No - patient declined information -    Tobacco Social History   Tobacco Use  Smoking Status Former Smoker  . Packs/day: 0.50  . Years: 20.00  . Pack years: 10.00  . Types: Cigarettes  . Last attempt to quit: 03/06/1977  . Years since quitting: 41.5  Smokeless Tobacco Never Used     Counseling given: Not Answered   Clinical Intake:  Pre-visit preparation completed: Yes  Pain : No/denies  pain     Nutritional Status: BMI 25 -29 Overweight Nutritional Risks: None Diabetes: Yes CBG done?: No Did pt. bring in CBG monitor from home?: No(done at home last night and blood surgar was 99)  How often do you need to have someone help you when you read instructions, pamphlets, or other written materials from your doctor or pharmacy?: 1 - Never What is the last grade level you completed in school?: 12  Interpreter Needed?: No  Information entered by :: Orlie Dakin LPN  Past Medical History:  Diagnosis Date  . Brittle bones    per pt, has soft bones in right foot/wears boot cast!  . CAD (coronary artery disease)   . Cataract    Bil/ surg scheduled for right eye 01/18/17/ left eye 02/08/17  . Charcot ankle, right 2019  . CHF (congestive heart failure) ([place]) 2015  . Diabetes mellitus   . Heart failure, diastolic ([place])   . Hyperlipidemia   . Hypertension   . Macular edema 2014  . OSA on CPAP   . Personal history of colonic polyps - adenomas 01/28/2014  . Renal insufficiency   . Shortness of breath dyspnea   . Syncope and collapse    Past Surgical History:  Procedure Laterality Date  . CARPAL TUNNEL RELEASE     left hand  . COLONOSCOPY    . KNEE ARTHROSCOPY Right 09/13/2016   [facility], Dr. Dorna Leitz  .  PILONIDAL CYST EXCISION     Family History  Problem Relation Age of Onset  . Diabetes Mother   . Kidney disease Mother   . Diabetes Father   . Coronary artery disease Father   . Diabetes Brother   . Diabetes Sister   . Cancer Maternal Uncle   . Diabetes Maternal Grandmother   . Cancer Paternal Grandmother   . Heart attack Paternal Grandfather   . Colon cancer Neg Hx    Social History   Socioeconomic History  . Marital status: Married    Spouse name: [name]  . Number of children: 5  . Years of education: 68  . Highest education level: 12th grade  Occupational History  . Occupation: Warehouse    Comment: retired  Scientific laboratory technician  . Financial  resource strain: Not hard at all  . Food insecurity:    Worry: Never true    Inability: Never true  . Transportation needs:    Medical: No    Non-medical: No  Tobacco Use  . Smoking status: Former Smoker    Packs/day: 0.50    Years: 20.00    Pack years: 10.00    Types: Cigarettes    Last attempt to quit: 03/06/1977    Years since quitting: 41.5  . Smokeless tobacco: Never Used  Substance and Sexual Activity  . Alcohol use: No  . Drug use: No  . Sexual activity: Not Currently  Lifestyle  . Physical activity:    Days per week: 0 days    Minutes per session: 0 min  . Stress: Not at all  Relationships  . Social connections:    Talks on phone: Twice a week    Gets together: Twice a week    Attends religious service: More than 4 times per year    Active member of club or organization: No    Attends meetings of clubs or organizations: Never    Relationship status: Married  Other Topics Concern  . Not on file  Social History Narrative   Drinks a cup of coffee daily. Drinks a lot of water daily. Goes out with church members during the week    Outpatient Encounter Medications as of 09/03/2018  Medication Sig  . allopurinol (ZYLOPRIM) 100 MG tablet TAKE 3 TABLETS BY MOUTH 2 TIMES A DAY  . AMBULATORY NON FORMULARY MEDICATION Continuous positive airway pressure (CPAP) device: Auto titrate minimum 4 cm H20 to maximum of 20 cm H2O with pressure. Please provide all supplemental supplies as needed. Fax to: [phone]  . amLODipine (NORVASC) 5 MG tablet Take 5 mg by mouth at bedtime.  Marland Kitchen apixaban (ELIQUIS) 5 MG TABS tablet Take 5 mg by mouth 2 (two) times daily.  Marland Kitchen atorvastatin (LIPITOR) 80 MG tablet Take 1 tablet (80 mg total) by mouth daily.  . B-D INS SYR ULTRAFINE 1CC/31G 31G X 5/16" 1 ML MISC   . Cholecalciferol (VITAMIN D3) 5000 units CAPS Take 5,000 Units by mouth daily.  . furosemide (LASIX) 80 MG tablet Take 80 mg by mouth 2 (two) times daily.   Marland Kitchen gabapentin (NEURONTIN) 300 MG  capsule TAKE 1 CAPSULE (300 MG TOTAL) BY MOUTH 2 (TWO) TIMES DAILY.  . hydrALAZINE (APRESOLINE) 100 MG tablet Take 100 mg by mouth 2 (two) times daily.   . isosorbide mononitrate (ISMO,MONOKET) 20 MG tablet TAKE 1 TABLET (20 MG TOTAL) BY MOUTH 2 (TWO) TIMES DAILY AT 10 AM AND 5 PM.  . metolazone (ZAROXOLYN) 5 MG tablet Take 5 mg by mouth  daily as needed (swelling).   . metoprolol succinate (TOPROL-XL) 50 MG 24 hr tablet Take 1 tablet (50 mg total) by mouth daily.  . nitroGLYCERIN (NITROSTAT) 0.4 MG SL tablet Place 1 tablet (0.4 mg total) under the tongue every 5 (five) minutes as needed for chest pain.  Marland Kitchen NOVOLIN N 100 UNIT/ML injection Inject 50 Units into the skin 2 (two) times daily before a meal. Novolin 50 units twice a day  . NOVOLIN R 100 UNIT/ML injection Injects 15-20 units TID with meals using a sliding scale  . ONE TOUCH ULTRA TEST test strip USE TO CHECK BLOOD SUGAR 4 TIMES A DAY DX E11.65  . potassium chloride SA (K-DUR,KLOR-CON) 20 MEQ tablet Take 1 tablet (20 mEq total) by mouth daily.  Marland Kitchen saccharomyces boulardii (FLORASTOR) 250 MG capsule Take 250 mg by mouth daily.  . AMBULATORY NON FORMULARY MEDICATION Rollator walker with seat. Dx: M25.561, M54.12, M10.00  . [DISCONTINUED] cloNIDine (CATAPRES) 0.2 MG tablet Take 1 tablet (0.2 mg total) by mouth daily. (Patient taking differently: Take 0.2 mg by mouth daily. Pt taking 1/2 tablet daily)   Facility-Administered Encounter Medications as of 09/03/2018  Medication  . 0.9 %  sodium chloride infusion    Activities of Daily Living In your present state of health, do you have any difficulty performing the following activities: 09/03/2018  Hearing? N  Vision? N  Difficulty concentrating or making decisions? N  Walking or climbing stairs? Y  Comment due to chracot foot  Dressing or bathing? N  Doing errands, shopping? N  Preparing Food and eating ? N  Using the Toilet? N  In the past six months, have you accidently leaked urine? Y   Comment dialy. Waiting in kidney transplant  Do you have problems with loss of bowel control? N  Managing your Medications? N  Managing your Finances? N  Housekeeping or managing your Housekeeping? N  Some recent data might be hidden    Patient Care Team: [name], MD as PCP - General Lonna Duval, MD as Referring Physician (Endocrinology) Fleet Contras, MD as Consulting Physician (Nephrology) Gerarda Fraction, MD as Referring Physician (Ophthalmology) Lyndee Hensen (Chiropractic Medicine)   Assessment:   This is a routine wellness examination for Kimball. Physical assessment deferred to PCP.   Exercise Activities and Dietary recommendations Current Exercise Habits: The patient does not participate in regular exercise at present, Exercise limited by: cardiac condition(s);orthopedic condition(s);Other - see comments(charcot foot and ankle) Diet Healthy diet with fruits and vegetables, cheese and almond milk. Breakfast:farmers omelet with sliced tomato no bread Lunch: tomato sandwich Dinner:  Baked flounder, tomatoes cole slaw.     Goals    . Eat more fruits and vegetables (pt-stated)       Fall Risk Fall Risk  09/03/2018 12/12/2016 12/12/2016 02/17/2016  Falls in the past year? No No No No  Risk for fall due to : Impaired balance/gait Impaired vision - -  Risk for fall due to: Comment - Pt wears OTC reading glasses and has bilateral cataracts anduntil he has those removed in March, 2018, he states he will likelly continue to have diff. seeing. - -   Is the patient's home free of loose throw rugs in walkways, pet beds, electrical cords, etc?   yes      Grab bars in the bathroom? yes      Handrails on the stairs?   yes      Adequate lighting?   yes   Depression Screen PHQ 2/9 Scores  09/03/2018 06/10/2018 12/10/2017 06/12/2017  PHQ - 2 Score 0 0 2 0  PHQ- 9 Score - - 4 -  Exception Documentation - - Medical reason -    Cognitive Function     6CIT Screen  09/03/2018 12/12/2016 12/12/2016  What Year? 0 points 0 points 0 points  What month? 0 points 0 points 0 points  What time? 0 points 0 points 0 points  Count back from 20 0 points 0 points 0 points  Months in reverse 0 points 0 points 0 points  Repeat phrase 2 points 0 points 0 points  Total Score 2 0 0    Immunization History  Administered Date(s) Administered  . Hep A / Hep B 11/21/2017, 12/24/2017  . Influenza Split 08/29/2012  . Influenza Whole 10/16/2005, 09/19/2007  . Influenza,inj,Quad PF,6+ Mos 07/31/2013, 10/13/2014, 08/12/2015  . Pneumococcal Conjugate-13 04/27/2015  . Pneumococcal Polysaccharide-23 08/10/2006, 12/12/2016  . Td 10/16/2005  . Tdap 02/17/2016    Screening Tests Health Maintenance  Topic Date Due  . OPHTHALMOLOGY EXAM  12/12/2017  . HEMOGLOBIN A1C  05/27/2018  . INFLUENZA VACCINE  05/10/2019 (Originally 06/20/2018)  . COLONOSCOPY  01/26/2020  . TETANUS/TDAP  02/16/2026  . Hepatitis C Screening  Completed  . PNA vac Low Risk Adult  Completed        Plan:    Please schedule your next medicare wellness visit with me in 1 yr.  Mr. Matassa , Thank you for taking time to come for your Medicare Wellness Visit. I appreciate your ongoing commitment to your health goals. Please review the following plan we discussed and let me know if I can assist you in the future.  Bring a copy of your living will and/or healthcare power of attorney to your next office visit. Continue doing brain stimulating activities (puzzles, reading, adult coloring books, staying active) to keep memory sharp.   on your health journey and I hope soon that you get your kidney transplant. Continue These are the goals we discussed: Goals    . Eat more fruits and vegetables (pt-stated)       This is a list of the screening recommended for you and due dates:  Health Maintenance  Topic Date Due  . Eye exam for diabetics  12/12/2017  . Hemoglobin A1C  05/27/2018  . Flu Shot  05/10/2019*  .  Colon Cancer Screening  01/26/2020  . Tetanus Vaccine  02/16/2026  .  Hepatitis C: One time screening is recommended by Center for Disease Control  (CDC) for  adults born from [birth year] through 1966.   Completed  . Pneumonia vaccines  Completed  *Topic was postponed. The date shown is not the original due date.     I have personally reviewed and noted the following in the patient's chart:   . Medical and social history . Use of alcohol, tobacco or illicit drugs  . Current medications and supplements . Functional ability and status . Nutritional status . Physical activity . Advanced directives . List of other physicians . Hospitalizations, surgeries, and ER visits in previous 12 months . Vitals . Screenings to include cognitive, depression, and falls . Referrals and appointments  In addition, I have reviewed and discussed with patient certain preventive protocols, quality metrics, and best practice recommendations. A written personalized care plan for preventive services as well as general preventive health recommendations were provided to patient.     Joanne Chars, LPN  [id number]

## 2018-09-03 ENCOUNTER — Ambulatory Visit (INDEPENDENT_AMBULATORY_CARE_PROVIDER_SITE_OTHER): Payer: Medicare Other | Admitting: *Deleted

## 2018-09-03 ENCOUNTER — Other Ambulatory Visit (HOSPITAL_COMMUNITY): Payer: Self-pay | Admitting: *Deleted

## 2018-09-03 VITALS — BP 122/50 | HR 68 | Ht 68.0 in | Wt 243.0 lb

## 2018-09-03 DIAGNOSIS — Z7682 Awaiting organ transplant status: Secondary | ICD-10-CM | POA: Diagnosis not present

## 2018-09-03 DIAGNOSIS — N184 Chronic kidney disease, stage 4 (severe): Secondary | ICD-10-CM | POA: Diagnosis not present

## 2018-09-03 DIAGNOSIS — Z0000 Encounter for general adult medical examination without abnormal findings: Secondary | ICD-10-CM | POA: Diagnosis not present

## 2018-09-03 NOTE — Patient Instructions (Addendum)
Please schedule your next medicare wellness visit with me in 1 yr.  Louis [name] , Thank you for taking time to come for your Medicare Wellness Visit. I appreciate your ongoing commitment to your health goals. Please review the following plan we discussed and let me know if I can assist you in the future.  Bring a copy of your living will and/or healthcare power of attorney to your next office visit. Continue doing brain stimulating activities (puzzles, reading, adult coloring books, staying active) to keep memory sharp.   on your health journey and I hope soon that you get your kidney transplant. Continue These are the goals we discussed: Goals    . Eat more fruits and vegetables (pt-stated)       This is a list of the screening recommended for you and due dates:  Health Maintenance  Topic Date Due  . Eye exam for diabetics  12/12/2017  . Hemoglobin A1C  05/27/2018  . Flu Shot  06/20/2018  . Colon Cancer Screening  01/26/2020  . Tetanus Vaccine  02/16/2026  .  Hepatitis C: One time screening is recommended by Center for Disease Control  (CDC) for  adults born from [birth year] through 1966.   Completed  . Pneumonia vaccines  Completed

## 2018-09-04 ENCOUNTER — Encounter (HOSPITAL_COMMUNITY)
Admission: RE | Admit: 2018-09-04 | Discharge: 2018-09-04 | Disposition: A | Discharge status: Home / Self Care | Payer: Medicare Other | Source: Ambulatory Visit | Attending: Nephrology | Admitting: Nephrology

## 2018-09-04 DIAGNOSIS — D631 Anemia in chronic kidney disease: Secondary | ICD-10-CM | POA: Insufficient documentation

## 2018-09-04 DIAGNOSIS — N189 Chronic kidney disease, unspecified: Secondary | ICD-10-CM | POA: Diagnosis not present

## 2018-09-04 LAB — COMPREHENSIVE METABOLIC PANEL
AG RATIO: 1.5 (calc) (ref 1.0–2.5)
ALKALINE PHOSPHATASE (APISO): 110 U/L (ref 40–115)
ALT: 12 U/L (ref 9–46)
AST: 17 U/L (ref 10–35)
Albumin: 3.8 g/dL (ref 3.6–5.1)
BUN / CREAT RATIO: 20 (calc) (ref 6–22)
BUN: 48 mg/dL — ABNORMAL HIGH (ref 7–25)
CO2: 28 mmol/L (ref 20–32)
Calcium: 9.2 mg/dL (ref 8.6–10.3)
Chloride: 100 mmol/L (ref 98–110)
Creat: 2.37 mg/dL — ABNORMAL HIGH (ref 0.70–1.18)
GLOBULIN: 2.6 g/dL (ref 1.9–3.7)
Glucose, Bld: 112 mg/dL — ABNORMAL HIGH (ref 65–99)
Potassium: 4.3 mmol/L (ref 3.5–5.3)
Sodium: 137 mmol/L (ref 135–146)
Total Bilirubin: 1 mg/dL (ref 0.2–1.2)
Total Protein: 6.4 g/dL (ref 6.1–8.1)

## 2018-09-04 MED ORDER — FERUMOXYTOL INJECTION 510 MG/17 ML
510.0000 mg | INTRAVENOUS | Status: DC
  Administered 2018-09-04: 510 mg via INTRAVENOUS
  Filled 2018-09-04: qty 17

## 2018-09-04 NOTE — Discharge Instructions (Signed)

## 2018-09-10 ENCOUNTER — Ambulatory Visit (HOSPITAL_COMMUNITY)
Admission: RE | Admit: 2018-09-10 | Discharge: 2018-09-10 | Disposition: A | Discharge status: Home / Self Care | Payer: Medicare Other | Source: Ambulatory Visit | Attending: Nephrology | Admitting: Nephrology

## 2018-09-10 DIAGNOSIS — D631 Anemia in chronic kidney disease: Secondary | ICD-10-CM | POA: Insufficient documentation

## 2018-09-10 DIAGNOSIS — N189 Chronic kidney disease, unspecified: Secondary | ICD-10-CM | POA: Diagnosis not present

## 2018-09-10 MED ORDER — SODIUM CHLORIDE 0.9 % IV SOLN
510.0000 mg | INTRAVENOUS | Status: AC
  Administered 2018-09-10: 510 mg via INTRAVENOUS
  Filled 2018-09-10: qty 17

## 2018-09-11 ENCOUNTER — Encounter (HOSPITAL_COMMUNITY): Payer: Medicare Other

## 2018-09-19 DIAGNOSIS — E113213 Type 2 diabetes mellitus with mild nonproliferative diabetic retinopathy with macular edema, bilateral: Secondary | ICD-10-CM | POA: Diagnosis not present

## 2018-09-19 DIAGNOSIS — E113212 Type 2 diabetes mellitus with mild nonproliferative diabetic retinopathy with macular edema, left eye: Secondary | ICD-10-CM | POA: Diagnosis not present

## 2018-09-24 DIAGNOSIS — N184 Chronic kidney disease, stage 4 (severe): Secondary | ICD-10-CM | POA: Diagnosis not present

## 2018-09-24 DIAGNOSIS — E113299 Type 2 diabetes mellitus with mild nonproliferative diabetic retinopathy without macular edema, unspecified eye: Secondary | ICD-10-CM | POA: Diagnosis not present

## 2018-09-24 DIAGNOSIS — E11610 Type 2 diabetes mellitus with diabetic neuropathic arthropathy: Secondary | ICD-10-CM | POA: Diagnosis not present

## 2018-09-24 DIAGNOSIS — E113519 Type 2 diabetes mellitus with proliferative diabetic retinopathy with macular edema, unspecified eye: Secondary | ICD-10-CM | POA: Diagnosis not present

## 2018-09-24 DIAGNOSIS — E1165 Type 2 diabetes mellitus with hyperglycemia: Secondary | ICD-10-CM | POA: Diagnosis not present

## 2018-09-24 DIAGNOSIS — Z794 Long term (current) use of insulin: Secondary | ICD-10-CM | POA: Diagnosis not present

## 2018-09-24 DIAGNOSIS — E1122 Type 2 diabetes mellitus with diabetic chronic kidney disease: Secondary | ICD-10-CM | POA: Diagnosis not present

## 2018-09-26 DIAGNOSIS — E113211 Type 2 diabetes mellitus with mild nonproliferative diabetic retinopathy with macular edema, right eye: Secondary | ICD-10-CM | POA: Diagnosis not present

## 2018-10-01 ENCOUNTER — Other Ambulatory Visit: Payer: Self-pay | Admitting: Cardiology

## 2018-10-04 ENCOUNTER — Ambulatory Visit (INDEPENDENT_AMBULATORY_CARE_PROVIDER_SITE_OTHER): Payer: Medicare Other | Admitting: Family Medicine

## 2018-10-04 ENCOUNTER — Encounter: Payer: Self-pay | Admitting: Family Medicine

## 2018-10-04 VITALS — BP 151/70 | HR 51 | Ht 68.0 in | Wt 245.0 lb

## 2018-10-04 DIAGNOSIS — I2510 Atherosclerotic heart disease of native coronary artery without angina pectoris: Secondary | ICD-10-CM | POA: Diagnosis not present

## 2018-10-04 DIAGNOSIS — R0781 Pleurodynia: Secondary | ICD-10-CM

## 2018-10-04 DIAGNOSIS — W19XXXA Unspecified fall, initial encounter: Secondary | ICD-10-CM

## 2018-10-04 DIAGNOSIS — S0990XA Unspecified injury of head, initial encounter: Secondary | ICD-10-CM

## 2018-10-04 DIAGNOSIS — R519 Headache, unspecified: Secondary | ICD-10-CM

## 2018-10-04 DIAGNOSIS — R110 Nausea: Secondary | ICD-10-CM

## 2018-10-04 DIAGNOSIS — R51 Headache: Secondary | ICD-10-CM

## 2018-10-04 DIAGNOSIS — J019 Acute sinusitis, unspecified: Secondary | ICD-10-CM

## 2018-10-04 MED ORDER — AZITHROMYCIN 250 MG PO TABS: ORAL_TABLET | ORAL | 0 refills | Status: AC

## 2018-10-04 NOTE — Patient Instructions (Signed)
Call if you are not feeling better after the weekend as far as the ear pressure and congestion and goes. If at any point you feel like your headache is getting worse or you start getting nauseated or throwing up then you need to let us know immediately or come to urgent care to be evaluated.

## 2018-10-04 NOTE — Progress Notes (Signed)
Est OV   Subjective:  Patient ID: Louis [name], male    DOB: 11/27/1945  Age: 72 y.o. MRN: [id number]  CC:  Chief Complaint  Patient presents with  . Fall    pt fell around 4 am this morning. he reports that he was on the toilet and was leaning forward and his feet slide from under him and he grabbed the rail with his L hand and fell forward towrds his R side he hit his R elbow and side, and front of head   . Headache    pt reports having a headache w/light sensitivity and nausea he stated that its behind his L eye and he said that he blew out a blood clot on the L side yesterday and this morning before arriving for his visit.     HPI CASTON COOPERSMITH presents for fall today  Past Medical History:  Diagnosis Date  . Brittle bones    per pt, has soft bones in right foot/wears boot cast!  . CAD (coronary artery disease)   . Cataract    Bil/ surg scheduled for right eye 01/18/17/ left eye 02/08/17  . Charcot ankle, right 2019  . CHF (congestive heart failure) ([place]) 2015  . Diabetes mellitus   . Heart failure, diastolic ([place])   . Hyperlipidemia   . Hypertension   . Macular edema 2014  . OSA on CPAP   . Personal history of colonic polyps - adenomas 01/28/2014  . Renal insufficiency   . Shortness of breath dyspnea   . Syncope and collapse     Past Surgical History:  Procedure Laterality Date  . CARPAL TUNNEL RELEASE     left hand  . COLONOSCOPY    . KNEE ARTHROSCOPY Right 09/13/2016   [facility], Dr. Dorna Leitz  . PILONIDAL CYST EXCISION      Family History  Problem Relation Age of Onset  . Diabetes Mother   . Kidney disease Mother   . Diabetes Father   . Coronary artery disease Father   . Diabetes Brother   . Diabetes Sister   . Cancer Maternal Uncle   . Diabetes Maternal Grandmother   . Cancer Paternal Grandmother   . Heart attack Paternal Grandfather   . Colon cancer Neg Hx     Social History   Socioeconomic History  . Marital status:  Married    Spouse name: [name]  . Number of children: 5  . Years of education: 91  . Highest education level: 12th grade  Occupational History  . Occupation: Warehouse    Comment: retired  Scientific laboratory technician  . Financial resource strain: Not hard at all  . Food insecurity:    Worry: Never true    Inability: Never true  . Transportation needs:    Medical: No    Non-medical: No  Tobacco Use  . Smoking status: Former Smoker    Packs/day: 0.50    Years: 20.00    Pack years: 10.00    Types: Cigarettes    Last attempt to quit: 03/06/1977    Years since quitting: 41.6  . Smokeless tobacco: Never Used  Substance and Sexual Activity  . Alcohol use: No  . Drug use: No  . Sexual activity: Not Currently  Lifestyle  . Physical activity:    Days per week: 0 days    Minutes per session: 0 min  . Stress: Not at all  Relationships  . Social connections:    Talks on phone: Twice  a week    Gets together: Twice a week    Attends religious service: More than 4 times per year    Active member of club or organization: No    Attends meetings of clubs or organizations: Never    Relationship status: Married  . Intimate partner violence:    Fear of current or ex partner: No    Emotionally abused: No    Physically abused: No    Forced sexual activity: No  Other Topics Concern  . Not on file  Social History Narrative   Drinks a cup of coffee daily. Drinks a lot of water daily. Goes out with church members during the week    ROS Review of Systems  Objective:   Today's Vitals: BP (!) 151/70   Pulse (!) 51   Ht 5\' 8"  (1.727 m)   Wt 245 lb (111.1 kg)   SpO2 98%   BMI 37.25 kg/m   Physical Exam  Constitutional: He is oriented to person, place, and time. He appears well-developed and well-nourished.  HENT:  Head: Normocephalic and atraumatic.  Right Ear: External ear normal.  Left Ear: External ear normal.  Nose: Nose normal.  Mouth/Throat: Oropharynx is clear and moist.  TMs and canals  are clear.   Eyes: Pupils are equal, round, and reactive to light. Conjunctivae and EOM are normal.  Neck: Neck supple. No thyromegaly present.  Cardiovascular: Normal rate and normal heart sounds.  Pulmonary/Chest: Effort normal and breath sounds normal.  Lymphadenopathy:    He has no cervical adenopathy.  Neurological: He is alert and oriented to person, place, and time. He has normal strength. He is not disoriented. No cranial nerve deficit.  Cranial nerves II through XII intact.  Upper extremity strength is 5 out of 5.  Hip strength is 5 out of 5.  He does have peripheral neuropathy and wears a brace on his right foot.  He has slow coordination of the fingers but is able to do it but he also has neuropathy in his hands which makes this more difficult and this is not new.  Skin: Skin is warm and dry.  Psychiatric: He has a normal mood and affect.    Assessment & Plan:   Problem List Items Addressed This Visit    None    Visit Diagnoses    Injury of head, initial encounter    -  Primary   We discussed option of head imaging or not.  Right now his neurologic exam is normal and the headache dizziness and ear pressure actually started yesterday befo   Fall from standing, initial encounter       Rib pain on right side       Acute nonintractable headache, unspecified headache type       Relevant Medications   gabapentin (NEURONTIN) 600 MG tablet   Nausea       Acute non-recurrent sinusitis, unspecified location       Relevant Medications   azithromycin (ZITHROMAX) 250 MG tablet      We discussed option of head imaging or not.  Preferred that we hold off especially since he currently has a normal neurologic exam or at least at his baseline.  Right now his neurologic exam is normal and the headache dizziness and ear pressure actually started yesterday before he actually fell and hit his head.  So I do not think at this point he has any worsening of his symptoms since he hit his head.  I  did warn that if it got worse or if he develop nausea or vomiting that he needs to go to the emergency department or urgent care immediately.  K to use Tylenol for pain control.  Acute sinusitis-recommend treatment with azithromycin because of his renal function.  If he is not better after a week and then please give [place] call back.  I do feel like this likely contributed to his fall forward.  Right rib injury just below the axilla-no evidence of fracture or bruising on exam it is sore to touch.  Just encouraged him to keep an eye on it.  Is not symptomatic or short of breath.  I do not think x-ray is warranted at this time but if it is does not improve or worsens then we can always get a plain film x-ray to look at the ribs.  Outpatient Encounter Medications as of 10/04/2018  Medication Sig  . allopurinol (ZYLOPRIM) 100 MG tablet TAKE 3 TABLETS BY MOUTH 2 TIMES A DAY  . AMBULATORY NON FORMULARY MEDICATION Rollator walker with seat. Dx: M25.561, M54.12, M10.00  . AMBULATORY NON FORMULARY MEDICATION Continuous positive airway pressure (CPAP) device: Auto titrate minimum 4 cm H20 to maximum of 20 cm H2O with pressure. Please provide all supplemental supplies as needed. Fax to: [phone]  . amLODipine (NORVASC) 5 MG tablet Take 5 mg by mouth at bedtime.  Marland Kitchen apixaban (ELIQUIS) 5 MG TABS tablet Take 5 mg by mouth 2 (two) times daily.  Marland Kitchen atorvastatin (LIPITOR) 80 MG tablet Take 1 tablet (80 mg total) by mouth daily.  . B-D INS SYR ULTRAFINE 1CC/31G 31G X 5/16" 1 ML MISC   . Cholecalciferol (VITAMIN D3) 5000 units CAPS Take 5,000 Units by mouth daily.  . furosemide (LASIX) 80 MG tablet Take 80 mg by mouth 2 (two) times daily.   Marland Kitchen gabapentin (NEURONTIN) 600 MG tablet Take 600 mg by mouth 2 (two) times daily.  . hydrALAZINE (APRESOLINE) 100 MG tablet Take 100 mg by mouth 2 (two) times daily.   . isosorbide mononitrate (ISMO,MONOKET) 20 MG tablet TAKE 1 TABLET (20 MG TOTAL) BY MOUTH 2 (TWO) TIMES DAILY AT  10 AM AND 5 PM.  . metolazone (ZAROXOLYN) 5 MG tablet Take 5 mg by mouth daily as needed (swelling).   . metoprolol succinate (TOPROL-XL) 50 MG 24 hr tablet Take 1 tablet (50 mg total) by mouth daily.  . nitroGLYCERIN (NITROSTAT) 0.4 MG SL tablet Place 1 tablet (0.4 mg total) under the tongue every 5 (five) minutes as needed for chest pain.  Marland Kitchen NOVOLIN N 100 UNIT/ML injection Inject 50 Units into the skin 2 (two) times daily before a meal. Novolin 50 units twice a day  . NOVOLIN R 100 UNIT/ML injection Injects 15-20 units TID with meals using a sliding scale  . ONE TOUCH ULTRA TEST test strip USE TO CHECK BLOOD SUGAR 4 TIMES A DAY DX E11.65  . potassium chloride SA (K-DUR,KLOR-CON) 20 MEQ tablet Take 1 tablet (20 mEq total) by mouth daily.  Marland Kitchen saccharomyces boulardii (FLORASTOR) 250 MG capsule Take 250 mg by mouth daily.  Marland Kitchen azithromycin (ZITHROMAX) 250 MG tablet 2 Ttabs PO on Day 1, then one a day x 4 days.  . [DISCONTINUED] gabapentin (NEURONTIN) 300 MG capsule TAKE 1 CAPSULE (300 MG TOTAL) BY MOUTH 2 (TWO) TIMES DAILY.   Facility-Administered Encounter Medications as of 10/04/2018  Medication  . 0.9 %  sodium chloride infusion    Follow-up: Return if symptoms worsen or fail to improve.  [name], MD

## 2018-10-07 DIAGNOSIS — N189 Chronic kidney disease, unspecified: Secondary | ICD-10-CM | POA: Diagnosis not present

## 2018-10-07 DIAGNOSIS — N184 Chronic kidney disease, stage 4 (severe): Secondary | ICD-10-CM | POA: Diagnosis not present

## 2018-10-08 ENCOUNTER — Other Ambulatory Visit: Payer: Self-pay

## 2018-10-14 DIAGNOSIS — E0822 Diabetes mellitus due to underlying condition with diabetic chronic kidney disease: Secondary | ICD-10-CM | POA: Diagnosis not present

## 2018-10-14 DIAGNOSIS — E669 Obesity, unspecified: Secondary | ICD-10-CM | POA: Diagnosis not present

## 2018-10-14 DIAGNOSIS — N2581 Secondary hyperparathyroidism of renal origin: Secondary | ICD-10-CM | POA: Diagnosis not present

## 2018-10-14 DIAGNOSIS — R809 Proteinuria, unspecified: Secondary | ICD-10-CM | POA: Diagnosis not present

## 2018-10-14 DIAGNOSIS — I129 Hypertensive chronic kidney disease with stage 1 through stage 4 chronic kidney disease, or unspecified chronic kidney disease: Secondary | ICD-10-CM | POA: Diagnosis not present

## 2018-10-14 DIAGNOSIS — M109 Gout, unspecified: Secondary | ICD-10-CM | POA: Diagnosis not present

## 2018-10-14 DIAGNOSIS — N184 Chronic kidney disease, stage 4 (severe): Secondary | ICD-10-CM | POA: Diagnosis not present

## 2018-10-15 NOTE — Progress Notes (Signed)
HPI: FU CAD and diastolic CHFand evaluate new atrial fibrillation. 4/12ABIs were normal. Patient had cardiac catheterization in April of 2012 that revealed 50% distal LM stenosis. LAD - mild diffuse 20% disease proximally. The LAD is occluded in the mid segment after the second diagonal and septal branch. It fills through very faint collaterals and overall appears to be diffusely diseased. First diagonal is a small-sized branch with a 60% ostial stenosis. Second diagonal is a relatively large size branch with 40-50% proximal disease. The third diagonal appears to have 2 branches and subtotally occluded. LCX with diffuse 40% disease in the mid segment before the takeoff of OM-3. RCA with 30% proximal discrete stenosis. There is also 20% mid stenosis. There is a 30% distal stenosis before the bifurcation. Medical therapy recommended. Patient admitted March 2018 with syncope. This apparently was felt to be defecation syncope. Toprol decreased due to bradycardia.Carotid dopplers 4/18 showed less than 50 stenosis bilaterally. Echo 2/19 showed hyperdynamic LV function, moderate LVH, mild diastolic dysfunction, mild LAE. Pt had dobutamine echo 2/19 prior to renal transplant; no ischemia; developed atrial fibrillation in recovery.Monitor4/19showed recurrent atrial fibrillation. At last ov, need for cardiac cath prior to renal transplant raised.  However renal function stabilized and this is now on hold.  Since lastseen,the patient has dyspnea with more extreme activities but not with routine activities. It is relieved with rest. It is not associated with chest pain. There is no orthopnea, PND or pedal edema. There is no syncope or palpitations. There is no exertional chest pain.   Current Outpatient Medications  Medication Sig Dispense Refill  . allopurinol (ZYLOPRIM) 100 MG tablet TAKE 3 TABLETS BY MOUTH 2 TIMES A DAY 540 tablet 1  . AMBULATORY NON FORMULARY MEDICATION Rollator walker with seat. Dx:  M25.561, M54.12, M10.00 1 each 0  . AMBULATORY NON FORMULARY MEDICATION Continuous positive airway pressure (CPAP) device: Auto titrate minimum 4 cm H20 to maximum of 20 cm H2O with pressure. Please provide all supplemental supplies as needed. Fax to: [phone] 1 Units 1  . amLODipine (NORVASC) 5 MG tablet Take 5 mg by mouth at bedtime.    Marland Kitchen apixaban (ELIQUIS) 5 MG TABS tablet Take 5 mg by mouth 2 (two) times daily.    . B-D INS SYR ULTRAFINE 1CC/31G 31G X 5/16" 1 ML MISC     . Cholecalciferol (VITAMIN D3) 5000 units CAPS Take 5,000 Units by mouth daily.    . Coenzyme Q10 (CO Q 10 PO) Take 1 tablet by mouth daily.    . furosemide (LASIX) 80 MG tablet Take 80 mg by mouth 2 (two) times daily.     Marland Kitchen gabapentin (NEURONTIN) 600 MG tablet Take 600 mg by mouth 2 (two) times daily.    . hydrALAZINE (APRESOLINE) 100 MG tablet Take 100 mg by mouth 2 (two) times daily.     . isosorbide mononitrate (ISMO,MONOKET) 20 MG tablet TAKE 1 TABLET (20 MG TOTAL) BY MOUTH 2 (TWO) TIMES DAILY AT 10 AM AND 5 PM. 180 tablet 2  . metolazone (ZAROXOLYN) 5 MG tablet Take 5 mg by mouth daily as needed (swelling).     . metoprolol succinate (TOPROL-XL) 50 MG 24 hr tablet Take 1 tablet (50 mg total) by mouth daily. 90 tablet 3  . nitroGLYCERIN (NITROSTAT) 0.4 MG SL tablet Place 1 tablet (0.4 mg total) under the tongue every 5 (five) minutes as needed for chest pain. 25 tablet 0  . NOVOLIN N 100 UNIT/ML injection Inject 50  Units into the skin 2 (two) times daily before a meal. Novolin 50 units twice a day    . NOVOLIN R 100 UNIT/ML injection Injects 15-20 units TID with meals using a sliding scale  5  . ONE TOUCH ULTRA TEST test strip USE TO CHECK BLOOD SUGAR 4 TIMES A DAY DX E11.65  1  . potassium chloride SA (K-DUR,KLOR-CON) 20 MEQ tablet Take 1 tablet (20 mEq total) by mouth daily. 90 tablet 3  . Red Yeast Rice Extract (RED YEAST RICE PO) Take 1 tablet by mouth 2 (two) times daily.    Marland Kitchen saccharomyces boulardii (FLORASTOR)  250 MG capsule Take 250 mg by mouth daily.     Current Facility-Administered Medications  Medication Dose Route Frequency Provider Last Rate Last Dose  . 0.9 %  sodium chloride infusion  500 mL Intravenous Continuous Gatha Mayer, MD         Past Medical History:  Diagnosis Date  . Brittle bones    per pt, has soft bones in right foot/wears boot cast!  . CAD (coronary artery disease)   . Cataract    Bil/ surg scheduled for right eye 01/18/17/ left eye 02/08/17  . Charcot ankle, right 2019  . CHF (congestive heart failure) ([place]) 2015  . Diabetes mellitus   . Heart failure, diastolic ([place])   . Hyperlipidemia   . Hypertension   . Macular edema 2014  . OSA on CPAP   . Personal history of colonic polyps - adenomas 01/28/2014  . Renal insufficiency   . Shortness of breath dyspnea   . Syncope and collapse     Past Surgical History:  Procedure Laterality Date  . CARPAL TUNNEL RELEASE     left hand  . COLONOSCOPY    . KNEE ARTHROSCOPY Right 09/13/2016   [facility], Dr. Dorna Leitz  . PILONIDAL CYST EXCISION      Social History   Socioeconomic History  . Marital status: Married    Spouse name: [name]  . Number of children: 5  . Years of education: 70  . Highest education level: 12th grade  Occupational History  . Occupation: Warehouse    Comment: retired  Scientific laboratory technician  . Financial resource strain: Not hard at all  . Food insecurity:    Worry: Never true    Inability: Never true  . Transportation needs:    Medical: No    Non-medical: No  Tobacco Use  . Smoking status: Former Smoker    Packs/day: 0.50    Years: 20.00    Pack years: 10.00    Types: Cigarettes    Last attempt to quit: 03/06/1977    Years since quitting: 41.6  . Smokeless tobacco: Never Used  Substance and Sexual Activity  . Alcohol use: No  . Drug use: No  . Sexual activity: Not Currently  Lifestyle  . Physical activity:    Days per week: 0 days    Minutes per session: 0 min  .  Stress: Not at all  Relationships  . Social connections:    Talks on phone: Twice a week    Gets together: Twice a week    Attends religious service: More than 4 times per year    Active member of club or organization: No    Attends meetings of clubs or organizations: Never    Relationship status: Married  . Intimate partner violence:    Fear of current or ex partner: No    Emotionally abused: No  Physically abused: No    Forced sexual activity: No  Other Topics Concern  . Not on file  Social History Narrative   Drinks a cup of coffee daily. Drinks a lot of water daily. Goes out with church members during the week    Family History  Problem Relation Age of Onset  . Diabetes Mother   . Kidney disease Mother   . Diabetes Father   . Coronary artery disease Father   . Diabetes Brother   . Diabetes Sister   . Cancer Maternal Uncle   . Diabetes Maternal Grandmother   . Cancer Paternal Grandmother   . Heart attack Paternal Grandfather   . Colon cancer Neg Hx     ROS: no fevers or chills, productive cough, hemoptysis, dysphasia, odynophagia, melena, hematochezia, dysuria, hematuria, rash, seizure activity, orthopnea, PND, pedal edema, claudication. Remaining systems are negative.  Physical Exam: Well-developed well-nourished in no acute distress.  Skin is warm and dry.  HEENT is normal.  Neck is supple.  Chest is clear to auscultation with normal expansion.  Cardiovascular exam is regular rate and rhythm.  Abdominal exam nontender or distended. No masses palpated. Extremities show no edema. neuro grossly intact   A/P  1 coronary artery disease-patient denies chest pain.  Plan to continue medical therapy.  No aspirin given need for anticoagulation. Resume statin.  2 chronic diastolic congestive heart failure-patient appears to be doing well from a volume standpoint.  Continue fluid restriction and low-sodium diet.  Continue present dose of Lasix.  3 paroxysmal atrial  fibrillation-patient remains in sinus rhythm on examination today.  Plan to continue Toprol for rate control if atrial fibrillation recurs.  Continue apixaban.  4 hypertension-blood pressure is elevated; increase norvasc to 10 mg daily and follow.  5 hyperlipidemia-Resume lipitor 80 mg daily; check lipids and liver 4 weeks  6 chronic renal insufficiency-followed by nephrology.  Patient was being considered for transplant but apparently his renal function has stabilized.  [name], MD

## 2018-10-23 ENCOUNTER — Ambulatory Visit (INDEPENDENT_AMBULATORY_CARE_PROVIDER_SITE_OTHER): Payer: Medicare Other | Admitting: Cardiology

## 2018-10-23 ENCOUNTER — Encounter: Payer: Self-pay | Admitting: Cardiology

## 2018-10-23 VITALS — BP 147/76 | HR 59 | Ht 68.0 in | Wt 245.1 lb

## 2018-10-23 DIAGNOSIS — E7800 Pure hypercholesterolemia, unspecified: Secondary | ICD-10-CM

## 2018-10-23 DIAGNOSIS — I2510 Atherosclerotic heart disease of native coronary artery without angina pectoris: Secondary | ICD-10-CM

## 2018-10-23 DIAGNOSIS — I10 Essential (primary) hypertension: Secondary | ICD-10-CM | POA: Diagnosis not present

## 2018-10-23 MED ORDER — AMLODIPINE BESYLATE 10 MG PO TABS: 10.0000 mg | ORAL_TABLET | Freq: Every day | ORAL | 3 refills | Status: DC

## 2018-10-23 MED ORDER — ATORVASTATIN CALCIUM 80 MG PO TABS: 80.0000 mg | ORAL_TABLET | Freq: Every day | ORAL | 3 refills | Status: DC

## 2018-10-23 NOTE — Patient Instructions (Addendum)
Medication Instructions:   INCREASE AMLODIPINE TO 10 MG ONCE DAILY= 2 OF THE 5 MG TABLETS ONCE DAILY  START ATORVASTATIN 80 MG ONCE DAILY  LAB WORK: Your physician recommends that you return for lab work in: [place]:  Your physician recommends that you schedule a follow-up appointment in: [place] 2 MONTHS PRIOR TO THAT APPOINTMENT TIME TO SCHEDULE    CALL ABOUT April TO SCHEDULE FOR June 2020

## 2018-10-24 DIAGNOSIS — E113213 Type 2 diabetes mellitus with mild nonproliferative diabetic retinopathy with macular edema, bilateral: Secondary | ICD-10-CM | POA: Diagnosis not present

## 2018-10-24 DIAGNOSIS — Z794 Long term (current) use of insulin: Secondary | ICD-10-CM | POA: Diagnosis not present

## 2018-10-29 ENCOUNTER — Encounter (INDEPENDENT_AMBULATORY_CARE_PROVIDER_SITE_OTHER): Payer: Self-pay | Admitting: Orthopedic Surgery

## 2018-10-29 ENCOUNTER — Ambulatory Visit (INDEPENDENT_AMBULATORY_CARE_PROVIDER_SITE_OTHER): Payer: Medicare Other | Admitting: Orthopedic Surgery

## 2018-10-29 VITALS — Ht 68.0 in | Wt 245.1 lb

## 2018-10-29 DIAGNOSIS — E11610 Type 2 diabetes mellitus with diabetic neuropathic arthropathy: Secondary | ICD-10-CM | POA: Diagnosis not present

## 2018-10-29 DIAGNOSIS — I87323 Chronic venous hypertension (idiopathic) with inflammation of bilateral lower extremity: Secondary | ICD-10-CM | POA: Diagnosis not present

## 2018-10-29 DIAGNOSIS — I2510 Atherosclerotic heart disease of native coronary artery without angina pectoris: Secondary | ICD-10-CM

## 2018-10-29 NOTE — Progress Notes (Signed)
Office Visit Note   Patient: Louis [name]           Date of Birth: 1946-04-25           MRN: [id number] Visit Date: 10/29/2018              Requested by: [name], [address] [place], [place] [address] PCP: [name], MD  Chief Complaint  Patient presents with  . Right Foot - Pain    New shoes       HPI: Patient is a 72 year old gentleman who presents for evaluation of both feet he has venous insufficiency has new extra-depth shoes with double upright braces on the right with custom orthotics.  Patient states the orthotics are worn out and the shoewear needs modification.   Assessment & Plan: Visit Diagnoses:  1. 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 Idiopathic chronic venous hypertension of both lower extremities with inflammation     Plan: Patient was given a prescription for biotech for extra-depth shoes custom orthotics supplies materials double upright brace and shoe support for the bracing.  Follow-Up Instructions: Return if symptoms worsen or fail to improve.   Ortho Exam  Patient is alert, oriented, no adenopathy, well-dressed, normal affect, normal respiratory effort. Examination patient does have venous stasis swelling there are no plantar ulcers there is no acute Charcot arthropathy no rocker-bottom deformity no redness no cellulitis no signs of infection.  Imaging: No results found. No images are attached to the encounter.  Labs: Lab Results  Component Value Date   HGBA1C 6.5 11/27/2017   HGBA1C 6.4 09/12/2017   HGBA1C 6.4 03/13/2017   ESRSEDRATE 81 (H) 02/17/2016   ESRSEDRATE 71 (H) 04/20/2015   CRP 7.0 (H) 02/17/2016   LABURIC 4.6 06/12/2017   LABURIC 5.9 12/20/2016   LABURIC 2.4 (L) 07/03/2016   GRAMSTAIN Few 04/20/2015   GRAMSTAIN WBC present-both PMN and Mononuclear 04/20/2015   GRAMSTAIN No Organisms Seen 04/20/2015   LABORGA ENTEROCOCCUS SPECIES 01/28/2016      Lab Results  Component Value Date   ALBUMIN 3.6 11/27/2016   ALBUMIN 3.7 01/28/2016   ALBUMIN 4.1 08/19/2015   LABURIC 4.6 06/12/2017   LABURIC 5.9 12/20/2016   LABURIC 2.4 (L) 07/03/2016    Body mass index is 37.27 kg/m.  Orders:  No orders of the defined types were placed in this encounter.  No orders of the defined types were placed in this encounter.    Procedures: No procedures performed  Clinical Data: No additional findings.  ROS:  All other systems negative, except as noted in the HPI. Review of Systems  Objective: Vital Signs: Ht 5\' 8"  (1.727 m)   Wt 245 lb 1.9 oz (111.2 kg)   BMI 37.27 kg/m   Specialty Comments:  No specialty comments available.  PMFS History: Patient Active Problem List   Diagnosis Date Noted  . Venous insufficiency (chronic) (peripheral) 09/06/2017  . Diabetic peripheral neuropathy ([place]) 06/12/2017  . Charcot gait 02/02/2017  . Charcot's joint 02/02/2017  . Heart failure, diastolic ([place])   . 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 Diabetic polyneuropathy associated with type 2 diabetes mellitus ([place]) 12/25/2016  . Idiopathic chronic venous hypertension of both lower extremities with inflammation 12/25/2016  . Cataract, right 05/18/2016  . Right cervical radiculopathy 05/12/2016  . 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 ([place]) 10/12/2015  .  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 Gout 04/20/2015  . Osteoarthritis of both acromioclavicular joints 12/03/2014  . Left shoulder pain 11/17/2014  . Chronic CHF (congestive heart failure) ([place]) 10/05/2014  . Unstable angina pectoris ([place]) 10/05/2014  . History of colonic polyps 01/28/2014  . Bilateral carpal tunnel syndrome 01/09/2014  . Macular edema, diabetic ([place]) 10/31/2013  . Obesity (BMI 30-39.9) 07/31/2013  . Fatigue 03/06/2012  . Astigmatism  01/11/2012  . CAD (coronary artery disease), native coronary artery 05/17/2011  . Nonspecific abnormal unspecified cardiovascular function study 03/01/2011  . Bradycardia 02/08/2011  . Right knee pain 02/08/2011  . Hyperlipidemia   . RBBB 02/01/2011  . Chronic kidney disease, stage 4, severely decreased GFR ([facility]) 10/23/2006  . Proteinuria 10/23/2006  . Obstructive sleep apnea 10/05/2006  . Diabetes mellitus with stage 4 chronic kidney disease ([place]) 08/28/2006  . HYPERTENSION, BENIGN SYSTEMIC 08/28/2006   Past Medical History:  Diagnosis Date  . Brittle bones    per pt, has soft bones in right foot/wears boot cast!  . CAD (coronary artery disease)   . Cataract    Bil/ surg scheduled for right eye 01/18/17/ left eye 02/08/17  . Charcot ankle, right 2019  . CHF (congestive heart failure) ([place]) 2015  . Diabetes mellitus   . Heart failure, diastolic ([place])   . Hyperlipidemia   . Hypertension   . Macular edema 2014  . OSA on CPAP   . Personal history of colonic polyps - adenomas 01/28/2014  . Renal insufficiency   . Shortness of breath dyspnea   . Syncope and collapse     Family History  Problem Relation Age of Onset  . Diabetes Mother   . Kidney disease Mother   . Diabetes Father   . Coronary artery disease Father   . Diabetes Brother   . Diabetes Sister   . Cancer Maternal Uncle   . Diabetes Maternal Grandmother   . Cancer Paternal Grandmother   . Heart attack Paternal Grandfather   . Colon cancer Neg Hx     Past Surgical History:  Procedure Laterality Date  . CARPAL TUNNEL RELEASE     left hand  . COLONOSCOPY    . KNEE ARTHROSCOPY Right 09/13/2016   [facility], Dr. Dorna Leitz  . PILONIDAL CYST EXCISION     Social History   Occupational History  . Occupation: Warehouse    Comment: retired  Tobacco Use  . Smoking status: Former Smoker    Packs/day: 0.50    Years: 20.00    Pack years: 10.00    Types: Cigarettes    Last attempt to quit: 03/06/1977     Years since quitting: 41.6  . Smokeless tobacco: Never Used  Substance and Sexual Activity  . Alcohol use: No  . Drug use: No  . Sexual activity: Not Currently

## 2018-10-31 DIAGNOSIS — Z794 Long term (current) use of insulin: Secondary | ICD-10-CM | POA: Diagnosis not present

## 2018-10-31 DIAGNOSIS — E113213 Type 2 diabetes mellitus with mild nonproliferative diabetic retinopathy with macular edema, bilateral: Secondary | ICD-10-CM | POA: Diagnosis not present

## 2018-11-02 DIAGNOSIS — Z885 Allergy status to narcotic agent status: Secondary | ICD-10-CM | POA: Diagnosis not present

## 2018-11-02 DIAGNOSIS — I509 Heart failure, unspecified: Secondary | ICD-10-CM | POA: Diagnosis not present

## 2018-11-02 DIAGNOSIS — R42 Dizziness and giddiness: Secondary | ICD-10-CM | POA: Diagnosis not present

## 2018-11-02 DIAGNOSIS — Z79899 Other long term (current) drug therapy: Secondary | ICD-10-CM | POA: Diagnosis not present

## 2018-11-02 DIAGNOSIS — E785 Hyperlipidemia, unspecified: Secondary | ICD-10-CM | POA: Diagnosis not present

## 2018-11-02 DIAGNOSIS — E11649 Type 2 diabetes mellitus with hypoglycemia without coma: Secondary | ICD-10-CM | POA: Diagnosis not present

## 2018-11-02 DIAGNOSIS — R600 Localized edema: Secondary | ICD-10-CM | POA: Diagnosis not present

## 2018-11-02 DIAGNOSIS — Z7901 Long term (current) use of anticoagulants: Secondary | ICD-10-CM | POA: Diagnosis not present

## 2018-11-02 DIAGNOSIS — I110 Hypertensive heart disease with heart failure: Secondary | ICD-10-CM | POA: Diagnosis not present

## 2018-11-02 DIAGNOSIS — Z794 Long term (current) use of insulin: Secondary | ICD-10-CM | POA: Diagnosis not present

## 2018-11-15 ENCOUNTER — Other Ambulatory Visit: Payer: Self-pay | Admitting: *Deleted

## 2018-11-15 DIAGNOSIS — R001 Bradycardia, unspecified: Secondary | ICD-10-CM

## 2018-11-15 DIAGNOSIS — I10 Essential (primary) hypertension: Secondary | ICD-10-CM

## 2018-11-15 DIAGNOSIS — I2510 Atherosclerotic heart disease of native coronary artery without angina pectoris: Secondary | ICD-10-CM

## 2018-11-15 DIAGNOSIS — E785 Hyperlipidemia, unspecified: Secondary | ICD-10-CM

## 2018-11-15 MED ORDER — HYDRALAZINE HCL 100 MG PO TABS: 100.0000 mg | ORAL_TABLET | Freq: Three times a day (TID) | ORAL | 1 refills | Status: DC

## 2018-11-28 DIAGNOSIS — Z794 Long term (current) use of insulin: Secondary | ICD-10-CM | POA: Diagnosis not present

## 2018-11-28 DIAGNOSIS — E113213 Type 2 diabetes mellitus with mild nonproliferative diabetic retinopathy with macular edema, bilateral: Secondary | ICD-10-CM | POA: Diagnosis not present

## 2018-12-05 DIAGNOSIS — E7800 Pure hypercholesterolemia, unspecified: Secondary | ICD-10-CM | POA: Diagnosis not present

## 2018-12-05 DIAGNOSIS — Z794 Long term (current) use of insulin: Secondary | ICD-10-CM | POA: Diagnosis not present

## 2018-12-05 DIAGNOSIS — E113213 Type 2 diabetes mellitus with mild nonproliferative diabetic retinopathy with macular edema, bilateral: Secondary | ICD-10-CM | POA: Diagnosis not present

## 2018-12-05 LAB — LIPID PANEL
CHOLESTEROL: 99 mg/dL (ref <200)
HDL: 29 mg/dL — AB (ref >40)
LDL Cholesterol (Calc): 51 mg/dL (calc)
Non-HDL Cholesterol (Calc): 70 mg/dL (calc) (ref <130)
Total CHOL/HDL Ratio: 3.4 (calc) (ref <5.0)
Triglycerides: 102 mg/dL (ref <150)

## 2018-12-05 LAB — HEPATIC FUNCTION PANEL
AG Ratio: 1.6 (calc) (ref 1.0–2.5)
ALBUMIN MSPROF: 4 g/dL (ref 3.6–5.1)
ALT: 13 U/L (ref 9–46)
AST: 18 U/L (ref 10–35)
Alkaline phosphatase (APISO): 116 U/L — ABNORMAL HIGH (ref 40–115)
BILIRUBIN DIRECT: 0.4 mg/dL — AB (ref 0.0–0.2)
BILIRUBIN TOTAL: 1.5 mg/dL — AB (ref 0.2–1.2)
GLOBULIN: 2.5 g/dL (ref 1.9–3.7)
Indirect Bilirubin: 1.1 mg/dL (calc) (ref 0.2–1.2)
Total Protein: 6.5 g/dL (ref 6.1–8.1)

## 2018-12-05 LAB — HM DIABETES EYE EXAM

## 2018-12-09 DIAGNOSIS — N189 Chronic kidney disease, unspecified: Secondary | ICD-10-CM | POA: Diagnosis not present

## 2018-12-09 DIAGNOSIS — N184 Chronic kidney disease, stage 4 (severe): Secondary | ICD-10-CM | POA: Diagnosis not present

## 2018-12-11 ENCOUNTER — Ambulatory Visit: Payer: Medicare Other | Admitting: Family Medicine

## 2018-12-12 ENCOUNTER — Ambulatory Visit (INDEPENDENT_AMBULATORY_CARE_PROVIDER_SITE_OTHER): Payer: Medicare Other

## 2018-12-12 ENCOUNTER — Telehealth: Payer: Self-pay | Admitting: *Deleted

## 2018-12-12 ENCOUNTER — Encounter: Payer: Self-pay | Admitting: Family Medicine

## 2018-12-12 ENCOUNTER — Ambulatory Visit (INDEPENDENT_AMBULATORY_CARE_PROVIDER_SITE_OTHER): Payer: Medicare Other | Admitting: Family Medicine

## 2018-12-12 VITALS — BP 138/61 | HR 63 | Ht 69.0 in | Wt 258.0 lb

## 2018-12-12 DIAGNOSIS — E113213 Type 2 diabetes mellitus with mild nonproliferative diabetic retinopathy with macular edema, bilateral: Secondary | ICD-10-CM

## 2018-12-12 DIAGNOSIS — M1A9XX1 Chronic gout, unspecified, with tophus (tophi): Secondary | ICD-10-CM

## 2018-12-12 DIAGNOSIS — N184 Chronic kidney disease, stage 4 (severe): Secondary | ICD-10-CM | POA: Diagnosis not present

## 2018-12-12 DIAGNOSIS — E1122 Type 2 diabetes mellitus with diabetic chronic kidney disease: Secondary | ICD-10-CM | POA: Diagnosis not present

## 2018-12-12 DIAGNOSIS — I10 Essential (primary) hypertension: Secondary | ICD-10-CM

## 2018-12-12 DIAGNOSIS — J90 Pleural effusion, not elsewhere classified: Secondary | ICD-10-CM

## 2018-12-12 DIAGNOSIS — E1142 Type 2 diabetes mellitus with diabetic polyneuropathy: Secondary | ICD-10-CM | POA: Diagnosis not present

## 2018-12-12 DIAGNOSIS — I509 Heart failure, unspecified: Secondary | ICD-10-CM

## 2018-12-12 DIAGNOSIS — E11311 Type 2 diabetes mellitus with unspecified diabetic retinopathy with macular edema: Secondary | ICD-10-CM | POA: Diagnosis not present

## 2018-12-12 DIAGNOSIS — R0602 Shortness of breath: Secondary | ICD-10-CM

## 2018-12-12 DIAGNOSIS — I5032 Chronic diastolic (congestive) heart failure: Secondary | ICD-10-CM | POA: Diagnosis not present

## 2018-12-12 DIAGNOSIS — E7800 Pure hypercholesterolemia, unspecified: Secondary | ICD-10-CM | POA: Diagnosis not present

## 2018-12-12 DIAGNOSIS — E11610 Type 2 diabetes mellitus with diabetic neuropathic arthropathy: Secondary | ICD-10-CM

## 2018-12-12 LAB — POCT GLYCOSYLATED HEMOGLOBIN (HGB A1C): Hemoglobin A1C: 5.7 % — AB (ref 4.0–5.6)

## 2018-12-12 LAB — URIC ACID: Uric Acid, Serum: 5.7 mg/dL (ref 4.0–8.0)

## 2018-12-12 MED ORDER — DOXYCYCLINE HYCLATE 100 MG PO TABS: 100.0000 mg | ORAL_TABLET | Freq: Two times a day (BID) | ORAL | 0 refills | Status: DC

## 2018-12-12 NOTE — Assessment & Plan Note (Signed)
On gabapentin for pain control.  Has complication of charcot foot.

## 2018-12-12 NOTE — Telephone Encounter (Signed)
Spoke w/pt's wife and advised her of the following results of his CXR he does not have Pneumonia however, he does have fluid that is causing him to have SOB. Dr. Madilyn Fireman is recommending that she has him to weigh before taking the Zaroxolyn today, and he will do the same again for the next two (2) mornings of weighing before taking the medication. Also told her that Dr. Madilyn Fireman called in an antibiotic for him to take.  She then informed me that when she weighed him this morning prior to his appointment his weight was 238 lbs and this was without clothing or shoes. And yesterday his weight was 238.4 lbs. I told her that his weight today was 258 lbs and she stated that his shoes, and brace are very heavy and she is unsure of how much these weigh alone. Also told her that when he was here in December his weight was 245 lbs. She informed me that she has been diligently tracking his weights.   She is also asking if Dr. Madilyn Fireman would consider taking over his Diabetes management due to not feeling connected with Dr. Steffanie Dunn. She said that he comes into the room and he doesn't check his feet or really seem interested.She feels that since his diabetes is under control maybe she would think about this.  I informed her that I would fwd this to Dr. Madilyn Fireman and get back to her .Elouise Munroe, [place]

## 2018-12-12 NOTE — Assessment & Plan Note (Signed)
He does feel short of breath today.  Unclear if that is related to the respiratory infection that he currently has even though he says he actually feels a little better today versus heart failure.  His wife reports that he has had increase in lower extremity swelling recently.  Lasix daily and then uses his Zaroxolyn usually a couple times a week.  Will get chest x-ray as patient felt short of breath walking to the scale in the office here today.

## 2018-12-12 NOTE — Assessment & Plan Note (Signed)
Follows with ortho and has a custom fit boot.

## 2018-12-12 NOTE — Assessment & Plan Note (Signed)
Will call for up to date eye report.

## 2018-12-12 NOTE — Assessment & Plan Note (Signed)
Pressure borderline today but under 140.  We will continue to monitor.  He recently saw cardiology.

## 2018-12-12 NOTE — Progress Notes (Signed)
Established Patient Office Visit  Subjective:  Patient ID: Louis [name], male    DOB: Mar 02, 1946  Age: 73 y.o. MRN: [id number]  CC:  Chief Complaint  Patient presents with  . Diabetes  . Hypertension    HPI [name] presents for F/u   Diabetes - no hypoglycemic events. No wounds or sores that are not healing well. No increased thirst or urination. Checking glucose at home. Taking medications as prescribed without any side effects.  He reduced his long-acting and short acting insulin because he was having some frequent hypoglycemic events.  He reports that those blood sugars seem to be under better control now.  He has decreased his long-acting insulin to 35 units twice a day and has decreased his Novolin R to between 5 to 10 units for each meal.  F/U gout -is actually doing well and has noted no flares or exacerbations since I last saw him.  Hypertension- Pt denies chest pain, SOB, dizziness, or heart palpitations.  Taking meds as directed w/o problems.  Denies medication side effects.    CKD 4 -have an appointment next week with nephrology.  They can a follow-up.  He has someone who is willing to donate a kidney and has had a full work-up.  They are just waiting to see if his creatinine is going to bump to 3 or higher.  His wife reports that he has had increase in lower extremity swelling recently.  Lasix daily and then uses his Zaroxolyn usually a couple times a week.  He also reports that he is had upper respiratory symptoms for about the last 5 days with some cough and nasal congestion and some shortness of breath.  His wife has noticed some intermittent wheezing.  He has had some chills but says he has checked his temperature and has not actually had a fever.   Past Medical History:  Diagnosis Date  . Brittle bones    per pt, has soft bones in right foot/wears boot cast!  . CAD (coronary artery disease)   . Cataract    Bil/ surg scheduled for right eye 01/18/17/  left eye 02/08/17  . Charcot ankle, right 2019  . CHF (congestive heart failure) ([place]) 2015  . Diabetes mellitus   . Heart failure, diastolic ([place])   . Hyperlipidemia   . Hypertension   . Macular edema 2014  . OSA on CPAP   . Personal history of colonic polyps - adenomas 01/28/2014  . Renal insufficiency   . Shortness of breath dyspnea   . Syncope and collapse     Past Surgical History:  Procedure Laterality Date  . CARPAL TUNNEL RELEASE     left hand  . COLONOSCOPY    . KNEE ARTHROSCOPY Right 09/13/2016   [facility], Dr. Dorna Leitz  . PILONIDAL CYST EXCISION      Family History  Problem Relation Age of Onset  . Diabetes Mother   . Kidney disease Mother   . Diabetes Father   . Coronary artery disease Father   . Diabetes Brother   . Diabetes Sister   . Cancer Maternal Uncle   . Diabetes Maternal Grandmother   . Cancer Paternal Grandmother   . Heart attack Paternal Grandfather   . Colon cancer Neg Hx     Social History   Socioeconomic History  . Marital status: Married    Spouse name: [name]  . Number of children: 5  . Years of education: 61  . Highest education  level: 12th grade  Occupational History  . Occupation: Warehouse    Comment: retired  Scientific laboratory technician  . Financial resource strain: Not hard at all  . Food insecurity:    Worry: Never true    Inability: Never true  . Transportation needs:    Medical: No    Non-medical: No  Tobacco Use  . Smoking status: Former Smoker    Packs/day: 0.50    Years: 20.00    Pack years: 10.00    Types: Cigarettes    Last attempt to quit: 03/06/1977    Years since quitting: 41.7  . Smokeless tobacco: Never Used  Substance and Sexual Activity  . Alcohol use: No  . Drug use: No  . Sexual activity: Not Currently  Lifestyle  . Physical activity:    Days per week: 0 days    Minutes per session: 0 min  . Stress: Not at all  Relationships  . Social connections:    Talks on phone: Twice a week    Gets  together: Twice a week    Attends religious service: More than 4 times per year    Active member of club or organization: No    Attends meetings of clubs or organizations: Never    Relationship status: Married  . Intimate partner violence:    Fear of current or ex partner: No    Emotionally abused: No    Physically abused: No    Forced sexual activity: No  Other Topics Concern  . Not on file  Social History Narrative   Drinks a cup of coffee daily. Drinks a lot of water daily. Goes out with church members during the week    Outpatient Medications Prior to Visit  Medication Sig Dispense Refill  . allopurinol (ZYLOPRIM) 100 MG tablet TAKE 3 TABLETS BY MOUTH 2 TIMES A DAY 540 tablet 1  . AMBULATORY NON FORMULARY MEDICATION Rollator walker with seat. Dx: M25.561, M54.12, M10.00 1 each 0  . AMBULATORY NON FORMULARY MEDICATION Continuous positive airway pressure (CPAP) device: Auto titrate minimum 4 cm H20 to maximum of 20 cm H2O with pressure. Please provide all supplemental supplies as needed. Fax to: [phone] 1 Units 1  . amLODipine (NORVASC) 10 MG tablet Take 1 tablet (10 mg total) by mouth at bedtime. 90 tablet 3  . apixaban (ELIQUIS) 5 MG TABS tablet Take 5 mg by mouth 2 (two) times daily.    Marland Kitchen atorvastatin (LIPITOR) 80 MG tablet Take 1 tablet (80 mg total) by mouth daily. 90 tablet 3  . B-D INS SYR ULTRAFINE 1CC/31G 31G X 5/16" 1 ML MISC     . Cholecalciferol (VITAMIN D3) 5000 units CAPS Take 5,000 Units by mouth daily.    . Coenzyme Q10 (CO Q 10 PO) Take 1 tablet by mouth daily.    . furosemide (LASIX) 80 MG tablet Take 80 mg by mouth 2 (two) times daily.     Marland Kitchen gabapentin (NEURONTIN) 600 MG tablet Take 600 mg by mouth 2 (two) times daily.    . hydrALAZINE (APRESOLINE) 100 MG tablet Take 1 tablet (100 mg total) by mouth 3 (three) times daily. 270 tablet 1  . isosorbide mononitrate (ISMO,MONOKET) 20 MG tablet TAKE 1 TABLET (20 MG TOTAL) BY MOUTH 2 (TWO) TIMES DAILY AT 10 AM AND 5  PM. 180 tablet 2  . metolazone (ZAROXOLYN) 5 MG tablet Take 5 mg by mouth daily as needed (swelling).     . metoprolol succinate (TOPROL-XL) 50 MG 24 hr tablet  Take 1 tablet (50 mg total) by mouth daily. 90 tablet 3  . nitroGLYCERIN (NITROSTAT) 0.4 MG SL tablet Place 1 tablet (0.4 mg total) under the tongue every 5 (five) minutes as needed for chest pain. 25 tablet 0  . NOVOLIN N 100 UNIT/ML injection Inject 50 Units into the skin 2 (two) times daily before a meal. Novolin 50 units twice a day    . NOVOLIN R 100 UNIT/ML injection Injects 15-20 units TID with meals using a sliding scale  5  . ONE TOUCH ULTRA TEST test strip USE TO CHECK BLOOD SUGAR 4 TIMES A DAY DX E11.65  1  . potassium chloride SA (K-DUR,KLOR-CON) 20 MEQ tablet Take 1 tablet (20 mEq total) by mouth daily. 90 tablet 3  . Red Yeast Rice Extract (RED YEAST RICE PO) Take 1 tablet by mouth 2 (two) times daily.    Marland Kitchen saccharomyces boulardii (FLORASTOR) 250 MG capsule Take 250 mg by mouth daily.     Facility-Administered Medications Prior to Visit  Medication Dose Route Frequency Provider Last Rate Last Dose  . 0.9 %  sodium chloride infusion  500 mL Intravenous Continuous Gatha Mayer, MD        Allergies  Allergen Reactions  . Hydrocodone Nausea And Vomiting    Other reaction(s): GI Upset (intolerance) Projectile vomiting  . Oxycodone Nausea And Vomiting    Other reaction(s): GI Upset (intolerance), Vomiting (intolerance) Projectile vomiting    ROS Review of Systems    Objective:    Physical Exam  Constitutional: He is oriented to person, place, and time. He appears well-developed and well-nourished.  HENT:  Head: Normocephalic and atraumatic.  Right Ear: External ear normal.  Left Ear: External ear normal.  Nose: Nose normal.  Mouth/Throat: Oropharynx is clear and moist.  TMs and canals are clear.   Eyes: Pupils are equal, round, and reactive to light. Conjunctivae and EOM are normal.  Neck: Neck supple. No  thyromegaly present.  Cardiovascular: Normal rate and normal heart sounds.  Pulmonary/Chest: Effort normal and breath sounds normal.  Lymphadenopathy:    He has no cervical adenopathy.  Neurological: He is alert and oriented to person, place, and time.  Skin: Skin is warm and dry.  Psychiatric: He has a normal mood and affect.    BP 138/61   Pulse 63   Ht 5\' 9"  (1.753 m)   SpO2 95%   BMI 36.20 kg/m  Wt Readings from Last 3 Encounters:  10/29/18 245 lb 1.9 oz (111.2 kg)  10/23/18 245 lb 1.9 oz (111.2 kg)  10/04/18 245 lb (111.1 kg)     Health Maintenance Due  Topic Date Due  . OPHTHALMOLOGY EXAM  12/12/2017  . HEMOGLOBIN A1C  05/27/2018    There are no preventive care reminders to display for this patient.  Lab Results  Component Value Date   TSH 2.391 02/02/2017   Lab Results  Component Value Date   WBC 8.2 04/29/2018   HGB 13.0 (L) 04/29/2018   HCT 37.1 (L) 04/29/2018   MCV 86.5 04/29/2018   PLT 222 04/29/2018   Lab Results  Component Value Date   NA 137 09/03/2018   K 4.3 09/03/2018   CO2 28 09/03/2018   GLUCOSE 112 (H) 09/03/2018   BUN 48 (H) 09/03/2018   CREATININE 2.37 (H) 09/03/2018   BILITOT 1.5 (H) 12/05/2018   ALKPHOS 98 11/27/2017   AST 18 12/05/2018   ALT 13 12/05/2018   PROT 6.5 12/05/2018   ALBUMIN 3.6  11/27/2016   CALCIUM 9.2 09/03/2018   ANIONGAP 12 02/03/2017   GFR 38.90 (L) 03/10/2011   Lab Results  Component Value Date   CHOL 99 12/05/2018   Lab Results  Component Value Date   HDL 29 (L) 12/05/2018   Lab Results  Component Value Date   LDLCALC 51 12/05/2018   Lab Results  Component Value Date   TRIG 102 12/05/2018   Lab Results  Component Value Date   CHOLHDL 3.4 12/05/2018   Lab Results  Component Value Date   HGBA1C 5.7 (A) 12/12/2018      Assessment & Plan:   Problem List Items Addressed This Visit      Cardiovascular and Mediastinum   HYPERTENSION, BENIGN SYSTEMIC    Pressure borderline today but under  140.  We will continue to monitor.  He recently saw cardiology.      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 ([place])    Will call for up to date eye report.       Macular edema, diabetic ([place])    Will call for up to date eye report.       Diabetic polyneuropathy associated with type 2 diabetes mellitus (HCC)    On gabapentin.       Diabetic peripheral neuropathy (HCC)    On gabapentin for pain control.  Has complication of charcot foot.       Diabetes mellitus with stage 4 chronic kidney disease (HCC) - Primary    A1c really well controlled today at 5.7.  That this may be in part due to some hypoglycemic episodes.  But recent adjustment to insulin has corrected this and he is actually doing really well in that regard now.      Relevant Orders   POCT glycosylated hemoglobin (Hb A1C) (Completed)     Other   SOB (shortness of breath) on exertion    He does feel short of breath today.  Unclear if that is related to the respiratory infection that he currently has even though he says he actually feels a little better today versus heart failure.  His wife reports that he has had increase in lower extremity swelling recently.  Lasix daily and then uses his Zaroxolyn usually a couple times a week.  Will get chest x-ray as patient felt short of breath walking to the scale in the office here today.      Relevant Orders   DG Chest 2 View (Completed)   Hyperlipidemia    Back on lipitor 80. Following with Cards.   Lab Results  Component Value Date   LDLCALC 51 12/05/2018         Gout    Symptoms well controlled.  Due to repeat uric acid today.  On allopurinol.      Relevant Orders   Uric acid      X-ray today showed evidence of heart failure.  We  will have him take his Zaroxolyn today and tomorrow to pull off extra fluid.  If at any point he feels like he is getting worse or becoming more short of breath and he will need to go to the emergency department immediately.  Meds ordered this encounter  Medications  . doxycycline (VIBRA-TABS) 100 MG  tablet    Sig: Take 1 tablet (100 mg total) by mouth 2 (two) times daily.    Dispense:  20 tablet    Refill:  0    Follow-up: Return in about 4 months (around 04/12/2019) for bp/DM.    [name], MD

## 2018-12-12 NOTE — Assessment & Plan Note (Signed)
Back on lipitor 80. Following with Cards.   Lab Results  Component Value Date   LDLCALC 51 12/05/2018

## 2018-12-12 NOTE — Telephone Encounter (Signed)
I would be happy to take back over his diabetes.

## 2018-12-12 NOTE — Assessment & Plan Note (Signed)
A1c really well controlled today at 5.7.  That this may be in part due to some hypoglycemic episodes.  But recent adjustment to insulin has corrected this and he is actually doing really well in that regard now.

## 2018-12-12 NOTE — Assessment & Plan Note (Addendum)
On gabapentin.  

## 2018-12-12 NOTE — Assessment & Plan Note (Signed)
Symptoms well controlled.  Due to repeat uric acid today.  On allopurinol.

## 2018-12-13 ENCOUNTER — Encounter: Payer: Self-pay | Admitting: Family Medicine

## 2018-12-13 NOTE — Telephone Encounter (Signed)
Spoke with pt's wife she informed me that she weighed pt and his weight was 250.2 lbs W/O clothes.  She had spoken w/his Nephrologist yesterday around 330 PM and informed them of what Dr. Madilyn Fireman had advised regarding his CXR and taking the Zaroxolyn and having him to weigh. And was told to DOUBLE his Lasix 80 (total 160 mg) and Zaroxolyn 5 mg (total 10 mg). She said that she was only able to give him the [facility]

## 2018-12-13 NOTE — Telephone Encounter (Signed)
Phone note close inadvertently. Please see previous.Louis [name], [name], CMA

## 2018-12-13 NOTE — Telephone Encounter (Signed)
Ok, to follow the Nephrologist recommendation of double lasix and the zaroxylin.

## 2018-12-13 NOTE — Telephone Encounter (Signed)
[name] was only able to give the 160 mg of lasix yesterday. She informed me that this morning she did give both doses of Lasix and zaroxolyn. She wanted to know if she should continue to follow the directions of the nephrologist or does Dr. Madilyn Fireman want her to do something different? I advised that I would send this to Dr. Madilyn Fireman however, I believe that the Nephrologist would be correct considering they have already assessed his kidney function and said that his creatinine was the same. She said that he has a f/u appt on Tuesday and that he had not had any SOB and appears not to be in any distress. I also advised that should anything change that she should take him to the ER IMMEDIATELY.!!! She voiced understanding and agreed. Will fwd to pcp for review and advice.Maryruth Eve, [name], CMA

## 2018-12-13 NOTE — Telephone Encounter (Signed)
Called and advised pt's wife to stick w/nephrologist recommendations. And keep f/u with them on Tuesday.Louis [name], [name]

## 2018-12-13 NOTE — Telephone Encounter (Signed)
lvm letting pt's wife know that Dr. Madilyn Fireman will take over his DM care.Louis [name], Louis [name]

## 2018-12-14 ENCOUNTER — Other Ambulatory Visit: Payer: Self-pay

## 2018-12-14 ENCOUNTER — Inpatient Hospital Stay (HOSPITAL_COMMUNITY)
Admission: AD | Admit: 2018-12-14 | Discharge: 2018-12-20 | DRG: 286 | Disposition: A | Discharge status: Home / Self Care | Payer: Medicare Other | Source: Other Acute Inpatient Hospital | Attending: Internal Medicine | Admitting: Internal Medicine

## 2018-12-14 ENCOUNTER — Encounter (HOSPITAL_COMMUNITY): Payer: Self-pay

## 2018-12-14 DIAGNOSIS — R0902 Hypoxemia: Secondary | ICD-10-CM | POA: Diagnosis present

## 2018-12-14 DIAGNOSIS — Z841 Family history of disorders of kidney and ureter: Secondary | ICD-10-CM

## 2018-12-14 DIAGNOSIS — E871 Hypo-osmolality and hyponatremia: Secondary | ICD-10-CM | POA: Diagnosis not present

## 2018-12-14 DIAGNOSIS — Z794 Long term (current) use of insulin: Secondary | ICD-10-CM

## 2018-12-14 DIAGNOSIS — G4733 Obstructive sleep apnea (adult) (pediatric): Secondary | ICD-10-CM | POA: Diagnosis present

## 2018-12-14 DIAGNOSIS — Z79899 Other long term (current) drug therapy: Secondary | ICD-10-CM | POA: Diagnosis not present

## 2018-12-14 DIAGNOSIS — Z885 Allergy status to narcotic agent status: Secondary | ICD-10-CM

## 2018-12-14 DIAGNOSIS — M109 Gout, unspecified: Secondary | ICD-10-CM | POA: Diagnosis present

## 2018-12-14 DIAGNOSIS — I4581 Long QT syndrome: Secondary | ICD-10-CM | POA: Diagnosis not present

## 2018-12-14 DIAGNOSIS — E1122 Type 2 diabetes mellitus with diabetic chronic kidney disease: Secondary | ICD-10-CM | POA: Diagnosis not present

## 2018-12-14 DIAGNOSIS — N19 Unspecified kidney failure: Secondary | ICD-10-CM | POA: Diagnosis not present

## 2018-12-14 DIAGNOSIS — I2510 Atherosclerotic heart disease of native coronary artery without angina pectoris: Secondary | ICD-10-CM | POA: Diagnosis not present

## 2018-12-14 DIAGNOSIS — E1142 Type 2 diabetes mellitus with diabetic polyneuropathy: Secondary | ICD-10-CM | POA: Diagnosis present

## 2018-12-14 DIAGNOSIS — Z9841 Cataract extraction status, right eye: Secondary | ICD-10-CM | POA: Diagnosis not present

## 2018-12-14 DIAGNOSIS — I25119 Atherosclerotic heart disease of native coronary artery with unspecified angina pectoris: Secondary | ICD-10-CM | POA: Diagnosis not present

## 2018-12-14 DIAGNOSIS — N186 End stage renal disease: Secondary | ICD-10-CM | POA: Diagnosis present

## 2018-12-14 DIAGNOSIS — E876 Hypokalemia: Secondary | ICD-10-CM | POA: Diagnosis present

## 2018-12-14 DIAGNOSIS — Z8719 Personal history of other diseases of the digestive system: Secondary | ICD-10-CM | POA: Diagnosis not present

## 2018-12-14 DIAGNOSIS — R0602 Shortness of breath: Secondary | ICD-10-CM | POA: Diagnosis not present

## 2018-12-14 DIAGNOSIS — I4891 Unspecified atrial fibrillation: Secondary | ICD-10-CM | POA: Diagnosis not present

## 2018-12-14 DIAGNOSIS — E782 Mixed hyperlipidemia: Secondary | ICD-10-CM

## 2018-12-14 DIAGNOSIS — Z7902 Long term (current) use of antithrombotics/antiplatelets: Secondary | ICD-10-CM | POA: Diagnosis not present

## 2018-12-14 DIAGNOSIS — E11610 Type 2 diabetes mellitus with diabetic neuropathic arthropathy: Secondary | ICD-10-CM | POA: Diagnosis not present

## 2018-12-14 DIAGNOSIS — E785 Hyperlipidemia, unspecified: Secondary | ICD-10-CM | POA: Diagnosis present

## 2018-12-14 DIAGNOSIS — I5031 Acute diastolic (congestive) heart failure: Secondary | ICD-10-CM | POA: Diagnosis not present

## 2018-12-14 DIAGNOSIS — I5033 Acute on chronic diastolic (congestive) heart failure: Secondary | ICD-10-CM | POA: Diagnosis not present

## 2018-12-14 DIAGNOSIS — I5030 Unspecified diastolic (congestive) heart failure: Secondary | ICD-10-CM | POA: Diagnosis present

## 2018-12-14 DIAGNOSIS — Z992 Dependence on renal dialysis: Secondary | ICD-10-CM

## 2018-12-14 DIAGNOSIS — Z9842 Cataract extraction status, left eye: Secondary | ICD-10-CM | POA: Diagnosis not present

## 2018-12-14 DIAGNOSIS — N185 Chronic kidney disease, stage 5: Secondary | ICD-10-CM | POA: Diagnosis not present

## 2018-12-14 DIAGNOSIS — Z87891 Personal history of nicotine dependence: Secondary | ICD-10-CM

## 2018-12-14 DIAGNOSIS — Z4901 Encounter for fitting and adjustment of extracorporeal dialysis catheter: Secondary | ICD-10-CM | POA: Diagnosis not present

## 2018-12-14 DIAGNOSIS — E11319 Type 2 diabetes mellitus with unspecified diabetic retinopathy without macular edema: Secondary | ICD-10-CM | POA: Diagnosis not present

## 2018-12-14 DIAGNOSIS — I361 Nonrheumatic tricuspid (valve) insufficiency: Secondary | ICD-10-CM | POA: Diagnosis not present

## 2018-12-14 DIAGNOSIS — I509 Heart failure, unspecified: Secondary | ICD-10-CM | POA: Diagnosis not present

## 2018-12-14 DIAGNOSIS — N2581 Secondary hyperparathyroidism of renal origin: Secondary | ICD-10-CM | POA: Diagnosis present

## 2018-12-14 DIAGNOSIS — Z833 Family history of diabetes mellitus: Secondary | ICD-10-CM | POA: Diagnosis not present

## 2018-12-14 DIAGNOSIS — N184 Chronic kidney disease, stage 4 (severe): Secondary | ICD-10-CM

## 2018-12-14 DIAGNOSIS — T502X5A Adverse effect of carbonic-anhydrase inhibitors, benzothiadiazides and other diuretics, initial encounter: Secondary | ICD-10-CM | POA: Diagnosis present

## 2018-12-14 DIAGNOSIS — I480 Paroxysmal atrial fibrillation: Secondary | ICD-10-CM | POA: Diagnosis present

## 2018-12-14 DIAGNOSIS — Q780 Osteogenesis imperfecta: Secondary | ICD-10-CM | POA: Diagnosis not present

## 2018-12-14 DIAGNOSIS — R601 Generalized edema: Secondary | ICD-10-CM | POA: Diagnosis present

## 2018-12-14 DIAGNOSIS — N179 Acute kidney failure, unspecified: Secondary | ICD-10-CM | POA: Diagnosis not present

## 2018-12-14 DIAGNOSIS — Z7901 Long term (current) use of anticoagulants: Secondary | ICD-10-CM

## 2018-12-14 DIAGNOSIS — R001 Bradycardia, unspecified: Secondary | ICD-10-CM | POA: Diagnosis not present

## 2018-12-14 DIAGNOSIS — E1169 Type 2 diabetes mellitus with other specified complication: Secondary | ICD-10-CM | POA: Diagnosis not present

## 2018-12-14 DIAGNOSIS — Z8249 Family history of ischemic heart disease and other diseases of the circulatory system: Secondary | ICD-10-CM

## 2018-12-14 DIAGNOSIS — N189 Chronic kidney disease, unspecified: Secondary | ICD-10-CM | POA: Diagnosis not present

## 2018-12-14 DIAGNOSIS — I130 Hypertensive heart and chronic kidney disease with heart failure and stage 1 through stage 4 chronic kidney disease, or unspecified chronic kidney disease: Secondary | ICD-10-CM | POA: Diagnosis not present

## 2018-12-14 LAB — BASIC METABOLIC PANEL
Anion gap: 17 — ABNORMAL HIGH (ref 5–15)
BUN: 112 mg/dL — ABNORMAL HIGH (ref 8–23)
CALCIUM: 9.4 mg/dL (ref 8.9–10.3)
CO2: 27 mmol/L (ref 22–32)
Chloride: 92 mmol/L — ABNORMAL LOW (ref 98–111)
Creatinine, Ser: 3.8 mg/dL — ABNORMAL HIGH (ref 0.61–1.24)
GFR calc Af Amer: 17 mL/min — ABNORMAL LOW (ref >60)
GFR calc non Af Amer: 15 mL/min — ABNORMAL LOW (ref >60)
Glucose, Bld: 115 mg/dL — ABNORMAL HIGH (ref 70–99)
Potassium: 3.4 mmol/L — ABNORMAL LOW (ref 3.5–5.1)
Sodium: 136 mmol/L (ref 135–145)

## 2018-12-14 LAB — GLUCOSE, CAPILLARY
Glucose-Capillary: 105 mg/dL — ABNORMAL HIGH (ref 70–99)
Glucose-Capillary: 180 mg/dL — ABNORMAL HIGH (ref 70–99)
Glucose-Capillary: 204 mg/dL — ABNORMAL HIGH (ref 70–99)

## 2018-12-14 MED ORDER — SODIUM CHLORIDE 0.9% FLUSH: 3.0000 mL | INTRAVENOUS | Status: DC | PRN

## 2018-12-14 MED ORDER — APIXABAN 2.5 MG PO TABS
2.5000 mg | ORAL_TABLET | Freq: Two times a day (BID) | ORAL | Status: DC
  Administered 2018-12-14 – 2018-12-15 (×3): 2.5 mg via ORAL
  Filled 2018-12-14 (×4): qty 1

## 2018-12-14 MED ORDER — AMLODIPINE BESYLATE 10 MG PO TABS
10.0000 mg | ORAL_TABLET | Freq: Every day | ORAL | Status: DC
  Administered 2018-12-15 – 2018-12-20 (×6): 10 mg via ORAL
  Filled 2018-12-14 (×6): qty 1

## 2018-12-14 MED ORDER — SODIUM CHLORIDE 0.9 % IV SOLN: 250.0000 mL | INTRAVENOUS | Status: DC | PRN

## 2018-12-14 MED ORDER — ACETAMINOPHEN 325 MG PO TABS
650.0000 mg | ORAL_TABLET | ORAL | Status: DC | PRN
  Administered 2018-12-15 – 2018-12-19 (×5): 650 mg via ORAL
  Filled 2018-12-14 (×6): qty 2

## 2018-12-14 MED ORDER — SODIUM CHLORIDE 0.9 % IV SOLN
10.00 | INTRAVENOUS | Status: DC
Start: ? — End: 2018-12-14

## 2018-12-14 MED ORDER — DEXTROSE 10 % IV SOLN
750.00 | INTRAVENOUS | Status: DC
Start: ? — End: 2018-12-14

## 2018-12-14 MED ORDER — HYDRALAZINE HCL 50 MG PO TABS
100.0000 mg | ORAL_TABLET | Freq: Three times a day (TID) | ORAL | Status: DC
  Administered 2018-12-14 – 2018-12-17 (×8): 100 mg via ORAL
  Filled 2018-12-14 (×8): qty 2

## 2018-12-14 MED ORDER — INSULIN ASPART 100 UNIT/ML ~~LOC~~ SOLN: 0.0000 [IU] | Freq: Every day | SUBCUTANEOUS | Status: DC

## 2018-12-14 MED ORDER — CO Q 10 60 MG PO CAPS: ORAL_CAPSULE | Freq: Every day | ORAL | Status: DC

## 2018-12-14 MED ORDER — SODIUM CHLORIDE 0.9% FLUSH
3.0000 mL | Freq: Two times a day (BID) | INTRAVENOUS | Status: DC
  Administered 2018-12-14 – 2018-12-18 (×6): 3 mL via INTRAVENOUS

## 2018-12-14 MED ORDER — ONDANSETRON HCL 4 MG/2ML IJ SOLN: 4.0000 mg | Freq: Four times a day (QID) | INTRAMUSCULAR | Status: DC | PRN

## 2018-12-14 MED ORDER — INSULIN NPH (HUMAN) (ISOPHANE) 100 UNIT/ML ~~LOC~~ SUSP
35.0000 [IU] | Freq: Two times a day (BID) | SUBCUTANEOUS | Status: DC
  Administered 2018-12-14 – 2018-12-20 (×12): 35 [IU] via SUBCUTANEOUS
  Filled 2018-12-14 (×2): qty 10

## 2018-12-14 MED ORDER — METOLAZONE 5 MG PO TABS: 5.0000 mg | ORAL_TABLET | Freq: Every day | ORAL | Status: DC | PRN

## 2018-12-14 MED ORDER — SACCHAROMYCES BOULARDII 250 MG PO CAPS
250.0000 mg | ORAL_CAPSULE | Freq: Every day | ORAL | Status: DC
  Administered 2018-12-15 – 2018-12-20 (×6): 250 mg via ORAL
  Filled 2018-12-14 (×6): qty 1

## 2018-12-14 MED ORDER — GENERIC EXTERNAL MEDICATION
25.00 | Status: DC
Start: ? — End: 2018-12-14

## 2018-12-14 MED ORDER — NITROGLYCERIN 0.4 MG SL SUBL: 0.4000 mg | SUBLINGUAL_TABLET | SUBLINGUAL | Status: DC | PRN

## 2018-12-14 MED ORDER — METOLAZONE 5 MG PO TABS
5.0000 mg | ORAL_TABLET | Freq: Two times a day (BID) | ORAL | Status: DC
  Administered 2018-12-14 – 2018-12-17 (×5): 5 mg via ORAL
  Filled 2018-12-14 (×5): qty 1

## 2018-12-14 MED ORDER — ATORVASTATIN CALCIUM 80 MG PO TABS
80.0000 mg | ORAL_TABLET | Freq: Every day | ORAL | Status: DC
  Administered 2018-12-15 – 2018-12-20 (×6): 80 mg via ORAL
  Filled 2018-12-14 (×6): qty 1

## 2018-12-14 MED ORDER — INSULIN ASPART 100 UNIT/ML ~~LOC~~ SOLN
0.0000 [IU] | Freq: Three times a day (TID) | SUBCUTANEOUS | Status: DC
  Administered 2018-12-15: 2 [IU] via SUBCUTANEOUS
  Administered 2018-12-15: 7 [IU] via SUBCUTANEOUS
  Administered 2018-12-15: 3 [IU] via SUBCUTANEOUS
  Administered 2018-12-16: 2 [IU] via SUBCUTANEOUS
  Administered 2018-12-17 – 2018-12-18 (×4): 3 [IU] via SUBCUTANEOUS

## 2018-12-14 MED ORDER — ISOSORBIDE MONONITRATE 20 MG PO TABS
20.0000 mg | ORAL_TABLET | Freq: Two times a day (BID) | ORAL | Status: DC
  Administered 2018-12-14 – 2018-12-20 (×11): 20 mg via ORAL
  Filled 2018-12-14 (×12): qty 1

## 2018-12-14 MED ORDER — INSULIN ASPART 100 UNIT/ML ~~LOC~~ SOLN
4.0000 [IU] | Freq: Three times a day (TID) | SUBCUTANEOUS | Status: DC
  Administered 2018-12-15 – 2018-12-20 (×9): 4 [IU] via SUBCUTANEOUS

## 2018-12-14 MED ORDER — VITAMIN D 25 MCG (1000 UNIT) PO TABS
5000.0000 [IU] | ORAL_TABLET | Freq: Every day | ORAL | Status: DC
  Administered 2018-12-15 – 2018-12-20 (×6): 5000 [IU] via ORAL
  Filled 2018-12-14 (×6): qty 5

## 2018-12-14 MED ORDER — METOPROLOL SUCCINATE ER 50 MG PO TB24
50.0000 mg | ORAL_TABLET | Freq: Every day | ORAL | Status: DC
  Filled 2018-12-14: qty 1

## 2018-12-14 MED ORDER — GENERIC EXTERNAL MEDICATION
10.00 | Status: DC
Start: ? — End: 2018-12-14

## 2018-12-14 MED ORDER — GENERIC EXTERNAL MEDICATION
15.00 | Status: DC
Start: ? — End: 2018-12-14

## 2018-12-14 MED ORDER — FUROSEMIDE 10 MG/ML IJ SOLN
10.0000 mg/h | INTRAVENOUS | Status: DC
  Administered 2018-12-14: 8 mg/h via INTRAVENOUS
  Administered 2018-12-15 – 2018-12-16 (×2): 10 mg/h via INTRAVENOUS
  Filled 2018-12-14: qty 25
  Filled 2018-12-14: qty 20
  Filled 2018-12-14: qty 25

## 2018-12-14 NOTE — Progress Notes (Signed)
RT entered pt room to assess pt and apply CPAP pt has brought his home machine. RT added an O2 adapter d/t pt requiring O2 to maintain his sats above 90%. Pt on home equipment with home setting of 4 cm H2O with 5lpm bled into system. RT will continue to monitor.

## 2018-12-14 NOTE — Consult Note (Addendum)
Cardiology Consultation:   Patient ID: DRESDEN LOZITO MRN: [id number]; DOB: 1946-03-10  Admit date: 12/14/2018 Date of Consult: 12/14/2018  Primary Care Provider: [name], MD Primary Cardiologist: Dr. Stanford Breed   Patient Profile:   Louis [name] is a 73 y.o. male with a hx of CAD, HFpEF, DM2, HTN, HLD, advanced CKD who is admitted with volume overload who is being seen today for the evaluation of HF and CAD at the request of Dr. Evangeline Gula.  History of Present Illness:   Louis [name] is a 73 y.o. male with a hx of CAD, HFpEF, AF, DM2, HTN, HLD, advanced CKD who is admitted with volume overload who is being seen today for the evaluation of HF and CAD.  The patient has a history of CAD and HFpEF and follows with Dr. Stanford Breed. He was recently seen in clinic in 10/2018, at which time he reported increased dyspnea without chest pain or orthopnea or edema. He is also undergoing evaluation for renal transplant (has a donor), and LHC was discussed, but the decision was made to defer this test until his need for transplant was more imminent.  Since his last cardiology appointment he was seen by his PCP, who increased his furosemide and metolazone for increased shortness of breath.   The patient presented to the [facility] ED today with worsening dyspnea. In the ED, pt was hypoxic to 80s. Cr 3.9 (baseline ~2.5). ProBNP 7378. Troponin 0.055. ECG reportedly showed AF with HR in 50s and unchanged from prior (not available for my review). CXR showed pulmonary edema. He was transferred to [facility] and admitted to the hospitalist service and started on a furosemide infusion. Cardiology was consulted for further recommendations.  On my evaluation, the patient is resting comfortably in bed. He reports progressive dyspnea and edema prior to admission despite increasing his diuretic. He denies chest pain or other symptoms.   Past Medical History:  Diagnosis Date  . Brittle bones    per  pt, has soft bones in right foot/wears boot cast!  . CAD (coronary artery disease)   . Cataract    Bil/ surg scheduled for right eye 01/18/17/ left eye 02/08/17  . Charcot ankle, right 2019  . CHF (congestive heart failure) ([place]) 2015  . Diabetes mellitus   . Heart failure, diastolic ([place])   . Hyperlipidemia   . Hypertension   . Macular edema 2014  . OSA on CPAP   . Personal history of colonic polyps - adenomas 01/28/2014  . Renal insufficiency   . Shortness of breath dyspnea   . Syncope and collapse     Past Surgical History:  Procedure Laterality Date  . CARPAL TUNNEL RELEASE     left hand  . COLONOSCOPY    . KNEE ARTHROSCOPY Right 09/13/2016   [facility], Dr. Dorna Leitz  . PILONIDAL CYST EXCISION       Home Medications:  Prior to Admission medications   Medication Sig Start Date End Date Taking? Authorizing Provider  allopurinol (ZYLOPRIM) 100 MG tablet TAKE 3 TABLETS BY MOUTH 2 TIMES A DAY Patient taking differently: Take 150 mg by mouth 2 (two) times daily.  01/14/18  Yes [name], MD  amLODipine (NORVASC) 10 MG tablet Take 1 tablet (10 mg total) by mouth at bedtime. Patient taking differently: Take 10 mg by mouth daily.  10/23/18  Yes Lelon Perla, MD  apixaban (ELIQUIS) 5 MG TABS tablet Take 5 mg by mouth 2 (two) times daily.  Yes [provider]  atorvastatin (LIPITOR) 80 MG tablet Take 1 tablet (80 mg total) by mouth daily. 10/23/18 01/21/19 Yes Lelon Perla, MD  Cholecalciferol (VITAMIN D3) 5000 units CAPS Take 5,000 Units by mouth daily.   Yes [provider]  Coenzyme Q10 (CO Q 10 PO) Take 1 tablet by mouth daily.   Yes [provider]  doxycycline (VIBRA-TABS) 100 MG tablet Take 1 tablet (100 mg total) by mouth 2 (two) times daily. 12/12/18  Yes [name], MD  furosemide (LASIX) 80 MG tablet Take 80 mg by mouth 2 (two) times daily.  10/08/14  Yes [provider]  gabapentin (NEURONTIN) 600  MG tablet Take 600 mg by mouth 2 (two) times daily. 09/24/18  Yes [provider]  hydrALAZINE (APRESOLINE) 100 MG tablet Take 1 tablet (100 mg total) by mouth 3 (three) times daily. 11/15/18  Yes [name], MD  isosorbide mononitrate (ISMO,MONOKET) 20 MG tablet TAKE 1 TABLET (20 MG TOTAL) BY MOUTH 2 (TWO) TIMES DAILY AT 10 AM AND 5 PM. 04/11/18  Yes Crenshaw, Denice Bors, MD  metolazone (ZAROXOLYN) 5 MG tablet Take 5 mg by mouth daily as needed (swelling).  03/24/16  Yes [provider]  metoprolol succinate (TOPROL-XL) 50 MG 24 hr tablet Take 1 tablet (50 mg total) by mouth daily. 12/10/17  Yes [name], MD  nitroGLYCERIN (NITROSTAT) 0.4 MG SL tablet Place 1 tablet (0.4 mg total) under the tongue every 5 (five) minutes as needed for chest pain. 10/07/14  Yes Almyra Deforest, PA  NOVOLIN N 100 UNIT/ML injection Inject 35 Units into the skin 2 (two) times daily before a meal.  12/05/16  Yes [provider]  NOVOLIN R 100 UNIT/ML injection Inject 15-20 Units into the skin 3 (three) times daily with meals.  02/10/16  Yes [provider]  potassium chloride SA (K-DUR,KLOR-CON) 20 MEQ tablet Take 1 tablet (20 mEq total) by mouth daily. 04/30/18  Yes Crenshaw, Denice Bors, MD  Red Yeast Rice Extract (RED YEAST RICE PO) Take 1 tablet by mouth 2 (two) times daily.   Yes [provider]  saccharomyces boulardii (FLORASTOR) 250 MG capsule Take 250 mg by mouth daily.   Yes [provider]  AMBULATORY NON FORMULARY MEDICATION Rollator walker with seat. Dx: I95.188, M54.12, M10.00 05/17/18   [name], MD  AMBULATORY NON FORMULARY MEDICATION Continuous positive airway pressure (CPAP) device: Auto titrate minimum 4 cm H20 to maximum of 20 cm H2O with pressure. Please provide all supplemental supplies as needed. Fax to: [phone] 05/30/18   Emeterio Reeve, DO  B-D INS SYR ULTRAFINE 1CC/31G 31G X 5/16" 1 ML MISC  02/06/17   [provider]  ONE TOUCH ULTRA TEST test strip USE TO CHECK BLOOD SUGAR 4 TIMES A DAY DX E11.65 02/25/17   [provider]    Inpatient Medications: Scheduled Meds: . [START ON 12/15/2018] amLODipine  10 mg Oral Daily  . [START ON 12/15/2018] atorvastatin  80 mg Oral Daily  . [START ON 12/15/2018] cholecalciferol  5,000 Units Oral Daily  . hydrALAZINE  100 mg Oral TID  . [START ON 12/15/2018] insulin aspart  0-15 Units Subcutaneous TID WC  . insulin aspart  0-5 Units Subcutaneous QHS  . [START ON 12/15/2018] insulin aspart  4 Units Subcutaneous TID WC  . insulin NPH Human  35 Units Subcutaneous BID AC  . isosorbide mononitrate  20 mg Oral BID  . metolazone  5 mg Oral  BID  . [START ON 12/15/2018] metoprolol succinate  50 mg Oral Daily  . [START ON 12/15/2018] saccharomyces boulardii  250 mg Oral Daily  . sodium chloride flush  3 mL Intravenous Q12H   Continuous Infusions: . sodium chloride    . furosemide (LASIX) infusion     PRN Meds: sodium chloride, acetaminophen, [START ON 12/15/2018] metolazone, nitroGLYCERIN, ondansetron (ZOFRAN) IV, sodium chloride flush  Allergies:    Allergies  Allergen Reactions  . Hydrocodone Nausea And Vomiting    Other reaction(s): GI Upset (intolerance) Projectile vomiting  . Oxycodone Nausea And Vomiting    Other reaction(s): GI Upset (intolerance), Vomiting (intolerance) Projectile vomiting    Social History:   Social History   Socioeconomic History  . Marital status: Married    Spouse name: [name]  . Number of children: 5  . Years of education: 45  . Highest education level: 12th grade  Occupational History  . Occupation: Warehouse    Comment: retired  Scientific laboratory technician  . Financial resource strain: Not hard at all  . Food insecurity:    Worry: Never true    Inability: Never true  . Transportation needs:    Medical: No    Non-medical: No  Tobacco Use  . Smoking status: Former Smoker    Packs/day: 0.50    Years: 20.00    Pack years: 10.00     Types: Cigarettes    Last attempt to quit: 03/06/1977    Years since quitting: 41.8  . Smokeless tobacco: Never Used  Substance and Sexual Activity  . Alcohol use: No  . Drug use: No  . Sexual activity: Not Currently  Lifestyle  . Physical activity:    Days per week: 0 days    Minutes per session: 0 min  . Stress: Not at all  Relationships  . Social connections:    Talks on phone: Twice a week    Gets together: Twice a week    Attends religious service: More than 4 times per year    Active member of club or organization: No    Attends meetings of clubs or organizations: Never    Relationship status: Married  . Intimate partner violence:    Fear of current or ex partner: No    Emotionally abused: No    Physically abused: No    Forced sexual activity: No  Other Topics Concern  . Not on file  Social History Narrative   Drinks a cup of coffee daily. Drinks a lot of water daily. Goes out with church members during the week    Family History:     Family History  Problem Relation Age of Onset  . Diabetes Mother   . Kidney disease Mother   . Diabetes Father   . Coronary artery disease Father   . Diabetes Brother   . Diabetes Sister   . Cancer Maternal Uncle   . Diabetes Maternal Grandmother   . Cancer Paternal Grandmother   . Heart attack Paternal Grandfather   . Colon cancer Neg Hx      ROS:  Please see the history of present illness.  All other ROS reviewed and negative.     Physical Exam/Data:   Vitals:   12/14/18 1500  BP: 132/61  Pulse: (!) 47  Resp: 18  Temp: (!) 97.4 F (36.3 C)  TempSrc: Oral  SpO2: 100%  Weight: 114.4 kg  Height: 5\' 9"  (1.753 m)   No intake or output data in the 24 hours ending  12/14/18 1857 Last 3 Weights 12/14/2018 12/12/2018 10/29/2018  Weight (lbs) 252 lb 1.6 oz 258 lb 245 lb 1.9 oz  Weight (kg) 114.352 kg 117.028 kg 111.186 kg     Body mass index is 37.23 kg/m.  General:  Well nourished, well developed, in no acute  distress HEENT: normal Neck: JVD at upper neck Cardiac:  Slow and irregular. No significant murmur   Lungs:  Normal WOB. Crackles at bases Abd: soft, obese Ext: 2+ pitting LE edema Musculoskeletal:  No deformities, BUE and BLE strength normal and equal Skin: warm and dry  Neuro:  No focal abnormalities noted Psych:  Normal affect   EKG:  pending Telemetry:  Telemetry was personally reviewed and demonstrates:  Slow AF  Relevant CV Studies:  Echocardiogram 12/2017 [facility]): The left ventricular size is normal. Moderate left ventricular hypertrophy LV ejection fraction = >70%. The left ventricle is hyperdynamic.  Left ventricular filling pattern is prolonged relaxation. The right ventricle is normal size. The right ventricular systolic function is normal. The left atrium is mildly dilated. No significant stenosis or regurgitation seen There was insufficient TR detected to calculate RV systolic pressure. Estimated right atrial pressure is 5 mmHg.Marland Kitchen There is no pericardial effusion. There is no comparison study available.  DSE 12/2017: No ischemia; developed atrial fibrillation in recovery.  LHC 02/2011: 50% distal LM stenosis. LAD - mild diffuse 20% disease proximally. The LAD is occluded in the mid segment after the second diagonal and septal branch. It fills through very faint collaterals and overall appears to be diffusely diseased. First diagonal is a small-sized branch with a 60% ostial stenosis. Second diagonal is a relatively large size branch with 40-50% proximal disease. The third diagonal appears to have 2 branches and subtotally occluded. LCX with diffuse 40% disease in the mid segment before the takeoff of OM-3. RCA with 30% proximal discrete stenosis. There is also 20% mid stenosis. There is a 30% distal stenosis before the bifurcation. Medical therapy recommended.  Laboratory Data:  ChemistryNo results for input(s): NA, K, CL, CO2, GLUCOSE, BUN, CREATININE, CALCIUM,  GFRNONAA, GFRAA, ANIONGAP in the last 168 hours.  No results for input(s): PROT, ALBUMIN, AST, ALT, ALKPHOS, BILITOT in the last 168 hours. HematologyNo results for input(s): WBC, RBC, HGB, HCT, MCV, MCH, MCHC, RDW, PLT in the last 168 hours. Cardiac EnzymesNo results for input(s): TROPONINI in the last 168 hours. No results for input(s): TROPIPOC in the last 168 hours.  BNPNo results for input(s): BNP, PROBNP in the last 168 hours.  DDimer No results for input(s): DDIMER in the last 168 hours.  Radiology/Studies:  Dg Chest 2 View  Result Date: 12/12/2018 CLINICAL DATA:  73 year old male with a history of shortness of breath EXAM: CHEST - 2 VIEW COMPARISON:  02/02/2017 FINDINGS: Cardiomediastinal silhouette unchanged in size and contour. Interlobular septal thickening with fullness in the central vasculature. Blunting of the bilateral costophrenic angles. Meniscus within the costophrenic sulcus on the lateral view. Degenerative changes with no acute displaced fracture IMPRESSION: Congestive heart failure with small pleural effusions. Electronically Signed   By: Corrie Mckusick D.O.   On: 12/12/2018 09:34    Assessment and Plan:   HFpEF with volume overload CKD Elevated troponin The patient has known HFpEF and advanced CKD. He is admitted with volume overload and dyspnea despite increase in oral diuretic. His presentation is consistent with HF exacerbation, likely with hypervolemia that is compounded by his CKD. He has no chest pain or reported ECG changes (repeat here pending) to  suggest ACS. He would benefit from diuresis. -Agree with IV furosemide infusion and metolazone -Repeat echocardiogram has been ordered  CAD As above, pt chest pain free with no reported ECG changes. His mildly elevated troponin is likely due to HF exacerbation. -ECG ordered -Continue atorvastatin  -Consider planned LHC for kidney transplant workup once euvolemic prior to discharge.  PAF Currently in slow  AF -Continue home BB -Apixaban transitioned to heparin   HTN BP elevated to 170s at OSH but improved here -Continue home amlodipine, metoprolol  HLD -Continue statin        For questions or updates, please contact [place] Please consult [email or web address] for contact info under     Signed, Nila Nephew, MD  12/14/2018 6:57 PM

## 2018-12-14 NOTE — H&P (Signed)
History and Physical    Louis [name] ZDG:[id number] DOB: [birth year] DOA: 12/14/2018  PCP: [name], MD  Patient coming from: Emergency department in [place]  I have personally briefly reviewed patient's old medical records in [place]  Chief Complaint: Shortness of breath  HPI: Louis [name] is a 73 y.o. male with medical history significant of kidney disease stage IV, coronary artery disease with plan for medical management, diabetes mellitus with Charcot foot, congestive heart failure diastolic, hypertension, obstructive sleep apnea on CPAP, diabetic peripheral neuropathy who presented to the ER in [place] with complaints of worsening shortness of breath.  Review of our records shows that the patient saw his primary care physician on the 23rd and was short of breath.  She recommended he increase his Lasix and check his weights daily.  Patient's wife called back on the 24th stating that he still had not had any good urine output.  At that point his Lasix was doubled to 160 twice daily with doubling of his metolazone.  The patient uses CPAP at home but is not on any home oxygen.  He reports that he is awaiting a kidney transplant and is followed by [facility] for the transplant but his nephrologist is Dr. Vanetta Mulders.  Presented to the emergency department in respiratory distress with O2 sats in the 80s.  A blood gas was obtained after he was started on 2 L of oxygen and his PO2 was 60.4.  His creatinine was measured at 3.92 and his BNP greater than 2000.  His CPK was 299 his troponin was indeterminate at 0.055.  His BUN was 111.  A chest x-ray obtained at [facility] showed pulmonary edema.  He had poor urine output while there.  Once started on oxygen and given IV Lasix and nitrates patient's symptoms improved significantly.  And he was able to be more comfortable on 2 L of oxygen.  Family requested transfer here so that he could to be seen by his  cardiologist because they believe he requires a cardiac catheterization so that he can get his kidney transplanted. Review of records shows patient was seen by Dr. Stanford Breed in October who was concerned about doing a cardiac catheterization on him as he was not yet on hemodialysis and did not want to inject dye thereby causing him to require dialysis.  He had spoken to Dr. Moshe Cipro and they were going to discuss the case with nephrology at [facility].  Has been some question as to whether the patient is in need of an urgent knee transplant as he has been fairly stable.  She reports to me that he has had poor urine output over the past few weeks.  He states that he is gained 8 to 15 pounds.   Review of Systems: As per HPI otherwise all other systems reviewed and  negative.    Past Medical History:  Diagnosis Date  . Brittle bones    per pt, has soft bones in right foot/wears boot cast!  . CAD (coronary artery disease)   . Cataract    Bil/ surg scheduled for right eye 01/18/17/ left eye 02/08/17  . Charcot ankle, right 2019  . CHF (congestive heart failure) ([place]) 2015  . Diabetes mellitus   . Heart failure, diastolic ([place])   . Hyperlipidemia   . Hypertension   . Macular edema 2014  . OSA on CPAP   . Personal history of colonic polyps - adenomas 01/28/2014  . Renal insufficiency   .  Shortness of breath dyspnea   . Syncope and collapse     Past Surgical History:  Procedure Laterality Date  . CARPAL TUNNEL RELEASE     left hand  . COLONOSCOPY    . KNEE ARTHROSCOPY Right 09/13/2016   [facility], Dr. Dorna Leitz  . PILONIDAL CYST EXCISION      Social History   Social History Narrative   Drinks a cup of coffee daily. Drinks a lot of water daily. Goes out with church members during the week     reports that he quit smoking about 41 years ago. His smoking use included cigarettes. He has a 10.00 pack-year smoking history. He has never used smokeless tobacco. He reports that  he does not drink alcohol or use drugs.  Allergies  Allergen Reactions  . Hydrocodone Nausea And Vomiting    Other reaction(s): GI Upset (intolerance) Projectile vomiting  . Oxycodone Nausea And Vomiting    Other reaction(s): GI Upset (intolerance), Vomiting (intolerance) Projectile vomiting    Family History  Problem Relation Age of Onset  . Diabetes Mother   . Kidney disease Mother   . Diabetes Father   . Coronary artery disease Father   . Diabetes Brother   . Diabetes Sister   . Cancer Maternal Uncle   . Diabetes Maternal Grandmother   . Cancer Paternal Grandmother   . Heart attack Paternal Grandfather   . Colon cancer Neg Hx      Prior to Admission medications   Medication Sig Start Date End Date Taking? Authorizing Provider  allopurinol (ZYLOPRIM) 100 MG tablet TAKE 3 TABLETS BY MOUTH 2 TIMES A DAY Patient taking differently: Take 150 mg by mouth 2 (two) times daily.  01/14/18  Yes [name], MD  amLODipine (NORVASC) 10 MG tablet Take 1 tablet (10 mg total) by mouth at bedtime. Patient taking differently: Take 10 mg by mouth daily.  10/23/18  Yes Lelon Perla, MD  apixaban (ELIQUIS) 5 MG TABS tablet Take 5 mg by mouth 2 (two) times daily.   Yes [provider]  atorvastatin (LIPITOR) 80 MG tablet Take 1 tablet (80 mg total) by mouth daily. 10/23/18 01/21/19 Yes Lelon Perla, MD  Cholecalciferol (VITAMIN D3) 5000 units CAPS Take 5,000 Units by mouth daily.   Yes [provider]  Coenzyme Q10 (CO Q 10 PO) Take 1 tablet by mouth daily.   Yes [provider]  doxycycline (VIBRA-TABS) 100 MG tablet Take 1 tablet (100 mg total) by mouth 2 (two) times daily. 12/12/18  Yes [name], MD  furosemide (LASIX) 80 MG tablet Take 80 mg by mouth 2 (two) times daily.  10/08/14  Yes [provider]  gabapentin (NEURONTIN) 600 MG tablet Take 600 mg by mouth 2 (two) times daily. 09/24/18  Yes [provider]    hydrALAZINE (APRESOLINE) 100 MG tablet Take 1 tablet (100 mg total) by mouth 3 (three) times daily. 11/15/18  Yes [name], MD  isosorbide mononitrate (ISMO,MONOKET) 20 MG tablet TAKE 1 TABLET (20 MG TOTAL) BY MOUTH 2 (TWO) TIMES DAILY AT 10 AM AND 5 PM. 04/11/18  Yes Crenshaw, Denice Bors, MD  metolazone (ZAROXOLYN) 5 MG tablet Take 5 mg by mouth daily as needed (swelling).  03/24/16  Yes [provider]  metoprolol succinate (TOPROL-XL) 50 MG 24 hr tablet Take 1 tablet (50 mg total) by mouth daily. 12/10/17  Yes [name], MD  nitroGLYCERIN (NITROSTAT) 0.4 MG SL tablet Place 1 tablet (  0.4 mg total) under the tongue every 5 (five) minutes as needed for chest pain. 10/07/14  Yes Almyra Deforest, PA  NOVOLIN N 100 UNIT/ML injection Inject 35 Units into the skin 2 (two) times daily before a meal.  12/05/16  Yes [provider]  NOVOLIN R 100 UNIT/ML injection Inject 15-20 Units into the skin 3 (three) times daily with meals.  02/10/16  Yes [provider]  potassium chloride SA (K-DUR,KLOR-CON) 20 MEQ tablet Take 1 tablet (20 mEq total) by mouth daily. 04/30/18  Yes Crenshaw, Denice Bors, MD  Red Yeast Rice Extract (RED YEAST RICE PO) Take 1 tablet by mouth 2 (two) times daily.   Yes [provider]  saccharomyces boulardii (FLORASTOR) 250 MG capsule Take 250 mg by mouth daily.   Yes [provider]  AMBULATORY NON FORMULARY MEDICATION Rollator walker with seat. Dx: O96.295, M54.12, M10.00 05/17/18   [name], MD  AMBULATORY NON FORMULARY MEDICATION Continuous positive airway pressure (CPAP) device: Auto titrate minimum 4 cm H20 to maximum of 20 cm H2O with pressure. Please provide all supplemental supplies as needed. Fax to: [phone] 05/30/18   Emeterio Reeve, DO  B-D INS SYR ULTRAFINE 1CC/31G 31G X 5/16" 1 ML MISC  02/06/17   [provider]  ONE TOUCH ULTRA TEST test strip USE TO CHECK BLOOD SUGAR 4 TIMES A DAY DX E11.65  02/25/17   [provider]    Physical Exam:  Constitutional: NAD, calm, comfortable Vitals:   12/14/18 1500  BP: 132/61  Pulse: (!) 47  Resp: 18  Temp: (!) 97.4 F (36.3 C)  TempSrc: Oral  SpO2: 100%  Weight: 114.4 kg  Height: 5\' 9"  (1.753 m)   Eyes: PERRL, lids and conjunctivae normal ENMT: Mucous membranes are moist. Posterior pharynx clear of any exudate or lesions.Normal dentition.  Neck: normal, supple, no masses, no thyromegaly Respiratory: clear to auscultation bilaterally, end expiratory wheezing consistent with cardiac asthma, no crackles.  Increased respiratory effort.  Mild accessory muscle use.  Cardiovascular: Regular rate and rhythm, no murmurs / rubs / gallops.  Extremity edema to the hips consistent with anasarca. 2+ pedal pulses. No carotid bruits.  Abdomen: no tenderness, no masses palpated. No hepatosplenomegaly. Bowel sounds positive.  Musculoskeletal: no clubbing / cyanosis. No joint deformity upper and lower extremities. Good ROM, no contractures. Normal muscle tone.  Skin: no rashes, lesions, ulcers. No induration Neurologic: CN 2-12 grossly intact. Sensation intact, DTR normal. Strength 5/5 in all 4.  Psychiatric: Normal judgment and insight. Alert and oriented x 3. Normal mood.    Labs on Admission: I have personally reviewed following labs and imaging studies  CBC: No results for input(s): WBC, NEUTROABS, HGB, HCT, MCV, PLT in the last 168 hours. Basic Metabolic Panel: No results for input(s): NA, K, CL, CO2, GLUCOSE, BUN, CREATININE, CALCIUM, MG, PHOS in the last 168 hours. GFR: CrCl cannot be calculated (Patient's most recent lab result is older than the maximum 21 days allowed.). Liver Function Tests: No results for input(s): AST, ALT, ALKPHOS, BILITOT, PROT, ALBUMIN in the last 168 hours. No results for input(s): LIPASE, AMYLASE in the last 168 hours. No results for input(s): AMMONIA in the last 168 hours. Coagulation Profile: No  results for input(s): INR, PROTIME in the last 168 hours. Cardiac Enzymes: No results for input(s): CKTOTAL, CKMB, CKMBINDEX, TROPONINI in the last 168 hours. BNP (last 3 results) No results for input(s): PROBNP in the last 8760 hours. HbA1C: Recent Labs    12/12/18  0846  HGBA1C 5.7*   CBG: No results for input(s): GLUCAP in the last 168 hours. Lipid Profile: No results for input(s): CHOL, HDL, LDLCALC, TRIG, CHOLHDL, LDLDIRECT in the last 72 hours. Thyroid Function Tests: No results for input(s): TSH, T4TOTAL, FREET4, T3FREE, THYROIDAB in the last 72 hours. Anemia Panel: No results for input(s): VITAMINB12, FOLATE, FERRITIN, TIBC, IRON, RETICCTPCT in the last 72 hours. Urine analysis:    Component Value Date/Time   COLORURINE YELLOW 02/02/2017 1146   APPEARANCEUR CLEAR 02/02/2017 1146   LABSPEC 1.011 02/02/2017 1146   PHURINE 5.0 02/02/2017 1146   GLUCOSEU NEGATIVE 02/02/2017 1146   HGBUR NEGATIVE 02/02/2017 [place] 02/02/2017 1146   BILIRUBINUR neg 01/28/2016 [place] 02/02/2017 1146   PROTEINUR 100 (A) 02/02/2017 1146   UROBILINOGEN 0.2 01/28/2016 1159   NITRITE NEGATIVE 02/02/2017 [place] 02/02/2017 1146    Radiological Exams on Admission: No results found.  EKG: Independently reviewed atrial fibrillation at 55 bpm not changed from prior  Assessment/Plan Principal Problem:   Anasarca Active Problems:   Heart failure, diastolic (HCC)   Diabetes mellitus with stage 4 chronic kidney disease (HCC)   Chronic kidney disease, stage 4, severely decreased GFR (HCC)   Diabetic polyneuropathy associated with type 2 diabetes mellitus (HCC)   Diabetic peripheral neuropathy (HCC)   Acute diastolic CHF (congestive heart failure) ([place])  1.  Anasarca: Patient with worsening renal function and poor urine output.  He did not respond to higher doses of Lasix and metolazone.  I have started him on an 8 mg/h Lasix drip.  We  will also add metolazone orally.  Will follow renal function closely with repeat in a.m.  2.  Heart failure diastolic: Continue diuresis suspect that this is all related to hypertension and diabetes causing renal disease causing heart failure.  3.  Diabetes mellitus with stage IV chronic kidney disease: We will start the patient on his home insulin dose as well as sliding scale insulin coverage  4.  Chronic kidney disease stage IV with severely decreased GFR: I have consulted nephrology for further assistance.  If the patient is not able to mobilize his fluids he may require dialysis although he does not appear to be unresponsive to diuretics yet will monitor closely.  5.  Diabetic peripheral neuropathy: Continue gabapentin.   DVT prophylaxis: Patient anticoagulated on Eliquis Code Status: Code Family Communication: Family present on admission.  Patient retains capacity. Disposition Plan: Likely home in 4 to 5 days once diuresed  consults called: Dr. Stanford Breed cardiology, Dr. Soyla Murphy nephrology Admission status: Inpatient   Lady Deutscher MD [facility] Pager [phone]  If 7PM-7AM, please contact night-coverage [email or web address] Password [facility]  12/14/2018, 4:38 PM

## 2018-12-14 NOTE — Progress Notes (Signed)
ANTICOAGULATION CONSULT NOTE - Initial Consult  Pharmacy Consult for Apixaban Indication: atrial fibrillation  Allergies  Allergen Reactions  . Hydrocodone Nausea And Vomiting    Other reaction(s): GI Upset (intolerance) Projectile vomiting  . Oxycodone Nausea And Vomiting    Other reaction(s): GI Upset (intolerance), Vomiting (intolerance) Projectile vomiting    Patient Measurements: Height: 5\' 9"  (175.3 cm) Weight: 252 lb 1.6 oz (114.4 kg) IBW/kg (Calculated) : 70.7  Vital Signs: Temp: 97.5 F (36.4 C) (01/25 1925) Temp Source: Oral (01/25 1925) BP: 148/75 (01/25 1925) Pulse Rate: 59 (01/25 1925)  Labs: Recent Labs    12/14/18 1848  CREATININE 3.80*    Estimated Creatinine Clearance: 21.6 mL/min (A) (by C-G formula based on SCr of 3.8 mg/dL (H)).   Medical History: Past Medical History:  Diagnosis Date  . Brittle bones    per pt, has soft bones in right foot/wears boot cast!  . CAD (coronary artery disease)   . Cataract    Bil/ surg scheduled for right eye 01/18/17/ left eye 02/08/17  . Charcot ankle, right 2019  . CHF (congestive heart failure) ([place]) 2015  . Diabetes mellitus   . Heart failure, diastolic ([place])   . Hyperlipidemia   . Hypertension   . Macular edema 2014  . OSA on CPAP   . Personal history of colonic polyps - adenomas 01/28/2014  . Renal insufficiency   . Shortness of breath dyspnea   . Syncope and collapse      Assessment: 73 yo male with atrial fibrillation. Pharmacy consulted to dose apixaban.  Patient on apixaban 5 mg po bid at home however, serum creatinine 3.8 (CrCl about 20 ml/min).    Plan:  Apixaban 2.5 mg po bid (adjusted due to renal dysfunction)  Alanda Slim, PharmD, [facility] Clinical Pharmacist Please see AMION for all Pharmacists' Contact Phone Numbers 12/14/2018, 8:31 PM

## 2018-12-15 ENCOUNTER — Encounter (HOSPITAL_COMMUNITY): Payer: Self-pay | Admitting: Nephrology

## 2018-12-15 ENCOUNTER — Inpatient Hospital Stay (HOSPITAL_COMMUNITY): Payer: Medicare Other

## 2018-12-15 ENCOUNTER — Other Ambulatory Visit: Payer: Self-pay

## 2018-12-15 DIAGNOSIS — I361 Nonrheumatic tricuspid (valve) insufficiency: Secondary | ICD-10-CM

## 2018-12-15 DIAGNOSIS — R601 Generalized edema: Secondary | ICD-10-CM

## 2018-12-15 DIAGNOSIS — E1122 Type 2 diabetes mellitus with diabetic chronic kidney disease: Secondary | ICD-10-CM

## 2018-12-15 DIAGNOSIS — E1142 Type 2 diabetes mellitus with diabetic polyneuropathy: Secondary | ICD-10-CM

## 2018-12-15 LAB — ECHOCARDIOGRAM COMPLETE
Height: 69 in
Weight: 3987.2 oz

## 2018-12-15 LAB — BASIC METABOLIC PANEL
Anion gap: 15 (ref 5–15)
BUN: 115 mg/dL — ABNORMAL HIGH (ref 8–23)
CO2: 27 mmol/L (ref 22–32)
Calcium: 9.1 mg/dL (ref 8.9–10.3)
Chloride: 93 mmol/L — ABNORMAL LOW (ref 98–111)
Creatinine, Ser: 3.95 mg/dL — ABNORMAL HIGH (ref 0.61–1.24)
GFR calc Af Amer: 16 mL/min — ABNORMAL LOW (ref >60)
GFR calc non Af Amer: 14 mL/min — ABNORMAL LOW (ref >60)
Glucose, Bld: 183 mg/dL — ABNORMAL HIGH (ref 70–99)
Potassium: 3.2 mmol/L — ABNORMAL LOW (ref 3.5–5.1)
Sodium: 135 mmol/L (ref 135–145)

## 2018-12-15 LAB — GLUCOSE, CAPILLARY
GLUCOSE-CAPILLARY: 238 mg/dL — AB (ref 70–99)
Glucose-Capillary: 185 mg/dL — ABNORMAL HIGH (ref 70–99)
Glucose-Capillary: 156 mg/dL — ABNORMAL HIGH (ref 70–99)
Glucose-Capillary: 137 mg/dL — ABNORMAL HIGH (ref 70–99)
Glucose-Capillary: 136 mg/dL — ABNORMAL HIGH (ref 70–99)

## 2018-12-15 MED ORDER — METOPROLOL SUCCINATE ER 25 MG PO TB24
25.0000 mg | ORAL_TABLET | Freq: Every day | ORAL | Status: DC
  Filled 2018-12-15: qty 1

## 2018-12-15 MED ORDER — POTASSIUM CHLORIDE CRYS ER 20 MEQ PO TBCR
20.0000 meq | EXTENDED_RELEASE_TABLET | Freq: Once | ORAL | Status: AC
  Administered 2018-12-15: 20 meq via ORAL
  Filled 2018-12-15: qty 1

## 2018-12-15 MED ORDER — POLYETHYLENE GLYCOL 3350 17 G PO PACK
17.0000 g | PACK | Freq: Every day | ORAL | Status: DC
  Administered 2018-12-16 – 2018-12-20 (×5): 17 g via ORAL
  Filled 2018-12-15 (×6): qty 1

## 2018-12-15 MED ORDER — GABAPENTIN 600 MG PO TABS
300.0000 mg | ORAL_TABLET | Freq: Every day | ORAL | Status: DC
  Administered 2018-12-15 – 2018-12-20 (×6): 300 mg via ORAL
  Filled 2018-12-15 (×6): qty 1

## 2018-12-15 MED ORDER — METOPROLOL SUCCINATE ER 25 MG PO TB24
25.0000 mg | ORAL_TABLET | Freq: Every day | ORAL | Status: DC
  Administered 2018-12-15: 25 mg via ORAL
  Filled 2018-12-15: qty 1

## 2018-12-15 NOTE — Progress Notes (Addendum)
Page to Cards PA, upon medication review pt with HR of 51, wonders if metoprolol should/could be decreased or held given pauses last evening/today > 2.0 seconds and HR at 33.   Pt also has eliquis ordered; staff requesting confirmation of proceeding with eliquis, given the complicated circumstances of patients renal/cardiac health.   Per B.Strader,PA pt ok to receive Eliquis today; usually switch to heparin 48hr prior to cath (so likely tomorrow). Change also made to reduce the metoprolol dose to 25, per previously noted HR/Rhythm.

## 2018-12-15 NOTE — Progress Notes (Signed)
PROGRESS NOTE    Louis [name]  QPY:[id number] DOB: [birth year] DOA: 12/14/2018 PCP: [name], MD   Brief Narrative: Louis [name] is a 73 y.o. male with medical history significant of kidney disease stage IV, coronary artery disease with plan for medical management, diabetes mellitus with Charcot foot, congestive heart failure diastolic, hypertension, obstructive sleep apnea on CPAP, diabetic peripheral neuropathy. Patient presented secondary to shortness of breath and started on lasix drip for acute heart failure.   Assessment & Plan:   Principal Problem:   Anasarca Active Problems:   Diabetes mellitus with stage 4 chronic kidney disease (HCC)   Chronic kidney disease, stage 4, severely decreased GFR (HCC)   Diabetic polyneuropathy associated with type 2 diabetes mellitus (HCC)   Heart failure, diastolic (HCC)   Diabetic peripheral neuropathy (HCC)   Acute diastolic CHF (congestive heart failure) ([place])   Anasarca Secondary to heart failure exacerbation. -Cardiology management  Acute on chronic diastolic heart failure EF of 65-70% -Cardiology management  Diabetes mellitus Patient is on [place] N/R as an outpatient -Continue NPH 35 units and SSI  AKI on CKD stage IV Baseline creatinine of 2.4-2.6. up to 3.95 today -Nephrology recommendations; considering possible HD vs aggressive diuresis vs both  Diabetic peripheral neuropathy On gabapentin as an outpatient. -Renally dose gabapentin  Hyperlipidemia -Continue Lipitor  Hypertension -Continue isosorbide mononitrate, metoprolol, hydralazine, amlodipine  Atrial fibrillation Rate control. -Continue Eliquis and metoprolol   DVT prophylaxis: Eliquis Code Status:   Code Status: Full Code Family Communication: Wife at bedside Disposition Plan: Discharge pending cardiology recommendations   Consultants:   Cardiology  Nephrology  Procedures:   None  Antimicrobials:  None     Subjective: Breathing is getting better.  Objective: Vitals:   12/15/18 0630 12/15/18 0837 12/15/18 1047 12/15/18 1139  BP: (!) 147/67 (!) 149/76 136/86 (!) 142/72  Pulse: (!) 45 (!) 49  (!) 54  Resp: 18 18  20   Temp: 97.6 F (36.4 C) 98.7 F (37.1 C)    TempSrc: Oral Oral    SpO2: 96% 100%  95%  Weight: 113 kg     Height:        Intake/Output Summary (Last 24 hours) at 12/15/2018 1401 Last data filed at 12/15/2018 1347 Gross per 24 hour  Intake 670.49 ml  Output 2450 ml  Net -1779.51 ml   Filed Weights   12/14/18 1500 12/15/18 0630  Weight: 114.4 kg 113 kg    Examination:  General exam: Appears calm and comfortable Respiratory system: Clear to auscultation. Respiratory effort normal. Cardiovascular system: S1 & S2 heard, RRR. No murmurs, rubs, gallops or clicks. Gastrointestinal system: Abdomen is nondistended, soft and nontender. No organomegaly or masses felt. Normal bowel sounds heard. Central nervous system: Alert and oriented. No focal neurological deficits. Extremities: LE edema. No calf tenderness Skin: No cyanosis. No rashes Psychiatry: Judgement and insight appear normal. Mood & affect appropriate.     Data Reviewed: I have personally reviewed following labs and imaging studies  CBC: No results for input(s): WBC, NEUTROABS, HGB, HCT, MCV, PLT in the last 168 hours. Basic Metabolic Panel: Recent Labs  Lab 12/14/18 1848 12/15/18 0445  NA 136 135  K 3.4* 3.2*  CL 92* 93*  CO2 27 27  GLUCOSE 115* 183*  BUN 112* 115*  CREATININE 3.80* 3.95*  CALCIUM 9.4 9.1   GFR: Estimated Creatinine Clearance: 20.6 mL/min (A) (by C-G formula based on SCr of 3.95 mg/dL (H)). Liver Function Tests: No results for input(s):  AST, ALT, ALKPHOS, BILITOT, PROT, ALBUMIN in the last 168 hours. No results for input(s): LIPASE, AMYLASE in the last 168 hours. No results for input(s): AMMONIA in the last 168 hours. Coagulation Profile: No results for input(s): INR,  PROTIME in the last 168 hours. Cardiac Enzymes: No results for input(s): CKTOTAL, CKMB, CKMBINDEX, TROPONINI in the last 168 hours. BNP (last 3 results) No results for input(s): PROBNP in the last 8760 hours. HbA1C: No results for input(s): HGBA1C in the last 72 hours. CBG: Recent Labs  Lab 12/14/18 2011 12/14/18 2139 12/15/18 0739 12/15/18 1137 12/15/18 1330  GLUCAP 180* 204* 185* 238* 156*   Lipid Profile: No results for input(s): CHOL, HDL, LDLCALC, TRIG, CHOLHDL, LDLDIRECT in the last 72 hours. Thyroid Function Tests: No results for input(s): TSH, T4TOTAL, FREET4, T3FREE, THYROIDAB in the last 72 hours. Anemia Panel: No results for input(s): VITAMINB12, FOLATE, FERRITIN, TIBC, IRON, RETICCTPCT in the last 72 hours. Sepsis Labs: No results for input(s): PROCALCITON, LATICACIDVEN in the last 168 hours.  No results found for this or any previous visit (from the past 240 hour(s)).       Radiology Studies: No results found.      Scheduled Meds: . amLODipine  10 mg Oral Daily  . apixaban  2.5 mg Oral BID  . atorvastatin  80 mg Oral Daily  . cholecalciferol  5,000 Units Oral Daily  . gabapentin  300 mg Oral Daily  . hydrALAZINE  100 mg Oral TID  . insulin aspart  0-15 Units Subcutaneous TID WC  . insulin aspart  0-5 Units Subcutaneous QHS  . insulin aspart  4 Units Subcutaneous TID WC  . insulin NPH Human  35 Units Subcutaneous BID AC  . isosorbide mononitrate  20 mg Oral BID  . metolazone  5 mg Oral BID  . metoprolol succinate  25 mg Oral Daily  . saccharomyces boulardii  250 mg Oral Daily  . sodium chloride flush  3 mL Intravenous Q12H   Continuous Infusions: . sodium chloride    . furosemide (LASIX) infusion 8 mg/hr (12/14/18 1956)     LOS: 1 day     Cordelia Poche, MD [facility] 12/15/2018, 2:01 PM  If 7PM-7AM, please contact night-coverage [email or web address]

## 2018-12-15 NOTE — Progress Notes (Signed)
   Vital Signs MEWS/VS Documentation      12/15/2018 1037 12/15/2018 1047 12/15/2018 1139 12/15/2018 1222   MEWS Score:  2  2  0  2   MEWS Score Color:  Yellow  Yellow  Green  Yellow   Resp:  -  -  20  -   Pulse:  -  -  (!) 54  -   BP:  -  136/86  (!) 142/72  -   O2 Device:  -  -  CPAP  -   O2 Flow Rate (L/min):  -  -  15 L/min  -           Janaia Kozel N Tobias-Diakun 12/15/2018,4:18 PM

## 2018-12-15 NOTE — Progress Notes (Signed)
Patient resting comfortably during shift report. Denies complaints.  

## 2018-12-15 NOTE — Progress Notes (Signed)
Progress Note  Patient Name: Louis [name] Date of Encounter: 12/15/2018  Primary Cardiologist: Stanford Breed  Subjective   Still feels dyspneic and legs swollen  Inpatient Medications    Scheduled Meds: . amLODipine  10 mg Oral Daily  . apixaban  2.5 mg Oral BID  . atorvastatin  80 mg Oral Daily  . cholecalciferol  5,000 Units Oral Daily  . hydrALAZINE  100 mg Oral TID  . insulin aspart  0-15 Units Subcutaneous TID WC  . insulin aspart  0-5 Units Subcutaneous QHS  . insulin aspart  4 Units Subcutaneous TID WC  . insulin NPH Human  35 Units Subcutaneous BID AC  . isosorbide mononitrate  20 mg Oral BID  . metolazone  5 mg Oral BID  . metoprolol succinate  50 mg Oral Daily  . saccharomyces boulardii  250 mg Oral Daily  . sodium chloride flush  3 mL Intravenous Q12H   Continuous Infusions: . sodium chloride    . furosemide (LASIX) infusion 8 mg/hr (12/14/18 1956)   PRN Meds: sodium chloride, acetaminophen, metolazone, nitroGLYCERIN, ondansetron (ZOFRAN) IV, sodium chloride flush   Vital Signs    Vitals:   12/14/18 1500 12/14/18 1925 12/15/18 0011 12/15/18 0630  BP: 132/61 (!) 148/75 (!) 148/79 (!) 147/67  Pulse: (!) 47 (!) 59 64 (!) 45  Resp: 18 20 18 18   Temp: (!) 97.4 F (36.3 C) (!) 97.5 F (36.4 C) 97.6 F (36.4 C) 97.6 F (36.4 C)  TempSrc: Oral Oral Oral Oral  SpO2: 100% 94% 98% 96%  Weight: 114.4 kg   113 kg  Height: 5\' 9"  (1.753 m)       Intake/Output Summary (Last 24 hours) at 12/15/2018 0812 Last data filed at 12/15/2018 0715 Gross per 24 hour  Intake 550.49 ml  Output 1350 ml  Net -799.51 ml   Last 3 Weights 12/15/2018 12/14/2018 12/12/2018  Weight (lbs) 249 lb 3.2 oz 252 lb 1.6 oz 258 lb  Weight (kg) 113.036 kg 114.352 kg 117.028 kg      Telemetry    NSR  - Personally Reviewed  ECG    SR no acute ST changes  - Personally Reviewed  Physical Exam  Overweight white male  GEN: No acute distress.   Neck: No JVD Cardiac: RRR, no murmurs,  rubs, or gallops.  Respiratory: basilar atelectasis GI: Soft, nontender, non-distended  MS: No edema; No deformity. Neuro:  Nonfocal  Psych: Normal affect  Plust 2 LE edema  Labs    Chemistry Recent Labs  Lab 12/14/18 1848 12/15/18 0445  NA 136 135  K 3.4* 3.2*  CL 92* 93*  CO2 27 27  GLUCOSE 115* 183*  BUN 112* 115*  CREATININE 3.80* 3.95*  CALCIUM 9.4 9.1  GFRNONAA 15* 14*  GFRAA 17* 16*  ANIONGAP 17* 15     HematologyNo results for input(s): WBC, RBC, HGB, HCT, MCV, MCH, MCHC, RDW, PLT in the last 168 hours.  Cardiac EnzymesNo results for input(s): TROPONINI in the last 168 hours. No results for input(s): TROPIPOC in the last 168 hours.   BNPNo results for input(s): BNP, PROBNP in the last 168 hours.   DDimer No results for input(s): DDIMER in the last 168 hours.   Radiology    No results found.  Cardiac Studies   None  Patient Profile     73 y.o. male with end stage renal failure admitted with volume overload History of HTN, DM2, HLD   Assessment & Plan  CHF:  Volume overload from renal failure Diuresing fairly well with lasix drip. He has a kidney donor and has been evaluated at [facility] for renal transplant. Cannot have transplant with out heart cath to clear coronaries. Since he has donor does not necessarily need fistula. Would diurese for 48 hours on iv lasix and plan right and left cath on Tuesday or Wends day. Need Dr Clover Mealy and renal to see Monday and they may need to place a temporary dialysis catheter post cath. EF normal on TTE 2018 with mild MR Repeat TTE ordered       For questions or updates, please contact [facility] Please consult [email or web address] for contact info under        Signed, Jenkins Rouge, MD  12/15/2018, 8:12 AM

## 2018-12-15 NOTE — Consult Note (Addendum)
Renal Service Consult Note [name]  Louis [name] 12/15/2018 Louis [name] Requesting Physician:  Louis Louis [name], R.   Reason for Consult:  Renal failure, CHF HPI: The patient is a 73 y.o. year-old with hx of CKD stage IV, HTN, macular edema, HL, diast HF, DM2, charcot ankle R, CAD who presented w/ SOB.  Saw PCP recently who ^'d the po lasix dose to 160 mg bid due to CHF on CXR she ordered. Pt did not improve. In ED was in redp distress w/ SpO2 in the 80's. CXR showed pulm edema, creat 3.92 (baseline in the 2's). He was given IV lasix and nitrates and his symptoms improved. Reportedly he needs a heart cath to get on the transplant list. Pt noted recent wt gain of 10-15 lbs.  We are asked to see for CKD IV.    Pt was admitted and started on IV lasix.  Yest had 1.75L out and today 1000 cc so far UOP.  His SOB has pretty much resolved since admission yesterday. He is lying flat now. No cough.  Per wife still sig swollen in the face and legs.    Baseline creat in 2018 was 2.0- 2.8.   In 2019 creat was 2.4- 3.2.  Here creat is 3.95 today.  He is f/b Louis [name] at [name].     CXR done 1/23 showed CHF w/ IS edema, this was OP CXR that PCP ordered a few days ago.   Pt sees Louis [name] for cardiology.  Hx of afib. Hx HFpEF.  Pt has a kidney donor and has been evaluated at [name] for renal transplant but needs heart cath to clear coronaries.  Cardiology has seen here and recommends IV diuresis for 2-3 days then heart cath.     ROS  denies CP  no joint pain   no HA  no blurry vision  no rash  no diarrhea  no nausea/ vomiting   Past Medical History  Past Medical History:  Diagnosis Date  . Brittle bones    per pt, has soft bones in right foot/wears boot cast!  . CAD (coronary artery disease)   . Cataract    Bil/ surg scheduled for right eye 01/18/17/ left eye 02/08/17  . Charcot ankle, right 2019  . CHF (congestive heart failure) ([place]) 2015  . Diabetes mellitus   .  Heart failure, diastolic ([place])   . Hyperlipidemia   . Hypertension   . Macular edema 2014  . OSA on CPAP   . Personal history of colonic polyps - adenomas 01/28/2014  . Renal insufficiency   . Shortness of breath dyspnea   . Syncope and collapse    Past Surgical History  Past Surgical History:  Procedure Laterality Date  . CARPAL TUNNEL RELEASE     left hand  . COLONOSCOPY    . KNEE ARTHROSCOPY Right 09/13/2016   [facility], Louis. Dorna Leitz  . PILONIDAL CYST EXCISION     Family History  Family History  Problem Relation Age of Onset  . Diabetes Mother   . Kidney disease Mother   . Diabetes Father   . Coronary artery disease Father   . Diabetes Brother   . Diabetes Sister   . Cancer Maternal Uncle   . Diabetes Maternal Grandmother   . Cancer Paternal Grandmother   . Heart attack Paternal Grandfather   . Colon cancer Neg Hx    Social History  reports that he quit smoking about 41 years ago. His  smoking use included cigarettes. He has a 10.00 pack-year smoking history. He has never used smokeless tobacco. He reports that he does not drink alcohol or use drugs. Allergies  Allergies  Allergen Reactions  . Hydrocodone Nausea And Vomiting    Other reaction(s): GI Upset (intolerance) Projectile vomiting  . Oxycodone Nausea And Vomiting    Other reaction(s): GI Upset (intolerance), Vomiting (intolerance) Projectile vomiting   Home medications Prior to Admission medications   Medication Sig Start Date End Date Taking? Authorizing Provider  allopurinol (ZYLOPRIM) 100 MG tablet TAKE 3 TABLETS BY MOUTH 2 TIMES A DAY Patient taking differently: Take 150 mg by mouth 2 (two) times daily.  01/14/18  Yes Louis [name]  amLODipine (NORVASC) 10 MG tablet Take 1 tablet (10 mg total) by mouth at bedtime. Patient taking differently: Take 10 mg by mouth daily.  10/23/18  Yes Louis [name]  apixaban (ELIQUIS) 5 MG TABS tablet Take 5 mg by mouth 2 (two) times  daily.   Yes Provider, Historical, [name]  atorvastatin (LIPITOR) 80 MG tablet Take 1 tablet (80 mg total) by mouth daily. 10/23/18 01/21/19 Yes Louis [name]  Cholecalciferol (VITAMIN D3) 5000 units CAPS Take 5,000 Units by mouth daily.   Yes Provider, Historical, [name]  Coenzyme Q10 (CO Q 10 PO) Take 1 tablet by mouth daily.   Yes Provider, Historical, [name]  doxycycline (VIBRA-TABS) 100 MG tablet Take 1 tablet (100 mg total) by mouth 2 (two) times daily. 12/12/18  Yes Louis [name]  furosemide (LASIX) 80 MG tablet Take 80 mg by mouth 2 (two) times daily.  10/08/14  Yes Provider, Historical, [name]  gabapentin (NEURONTIN) 600 MG tablet Take 600 mg by mouth 2 (two) times daily. 09/24/18  Yes Provider, Historical, [name]  hydrALAZINE (APRESOLINE) 100 MG tablet Take 1 tablet (100 mg total) by mouth 3 (three) times daily. 11/15/18  Yes Louis [name]  isosorbide mononitrate (ISMO,MONOKET) 20 MG tablet TAKE 1 TABLET (20 MG TOTAL) BY MOUTH 2 (TWO) TIMES DAILY AT 10 AM AND 5 PM. 04/11/18  Yes Louis [name]  metolazone (ZAROXOLYN) 5 MG tablet Take 5 mg by mouth daily as needed (swelling).  03/24/16  Yes Provider, Historical, [name]  metoprolol succinate (TOPROL-XL) 50 MG 24 hr tablet Take 1 tablet (50 mg total) by mouth daily. 12/10/17  Yes Louis [name]  nitroGLYCERIN (NITROSTAT) 0.4 MG SL tablet Place 1 tablet (0.4 mg total) under the tongue every 5 (five) minutes as needed for chest pain. 10/07/14  Yes Louis [name]  NOVOLIN N 100 UNIT/ML injection Inject 35 Units into the skin 2 (two) times daily before a meal.  12/05/16  Yes Provider, Historical, [name]  NOVOLIN R 100 UNIT/ML injection Inject 15-20 Units into the skin 3 (three) times daily with meals.  02/10/16  Yes Provider, Historical, [name]  potassium chloride SA (K-DUR,KLOR-CON) 20 MEQ tablet Take 1 tablet (20 mEq total) by mouth daily. 04/30/18  Yes Louis [name]  Red Yeast Rice Extract (RED YEAST RICE PO) Take 1 tablet by mouth  2 (two) times daily.   Yes Provider, Historical, [name]  saccharomyces boulardii (FLORASTOR) 250 MG capsule Take 250 mg by mouth daily.   Yes Provider, Historical, [name]  AMBULATORY NON FORMULARY MEDICATION Rollator walker with seat. Dx: I96.789, M54.12, M10.00 05/17/18   Louis [name]  AMBULATORY NON FORMULARY MEDICATION Continuous positive airway pressure (CPAP) device: Auto titrate minimum 4 cm H20 to maximum of  20 cm H2O with pressure. Please provide all supplemental supplies as needed. Fax to: [phone] 05/30/18   Emeterio Reeve, DO  B-D INS SYR ULTRAFINE 1CC/31G 31G X 5/16" 1 ML MISC  02/06/17   Provider, Historical, [name]  ONE TOUCH ULTRA TEST test strip USE TO CHECK BLOOD SUGAR 4 TIMES A DAY DX E11.65 02/25/17   Provider, Historical, [name]   Liver Function Tests No results for input(s): AST, ALT, ALKPHOS, BILITOT, PROT, ALBUMIN in the last 168 hours. No results for input(s): LIPASE, AMYLASE in the last 168 hours. CBC No results for input(s): WBC, NEUTROABS, HGB, HCT, MCV, PLT in the last 168 hours. Basic Metabolic Panel Recent Labs  Lab 12/14/18 1848 12/15/18 0445  NA 136 135  K 3.4* 3.2*  CL 92* 93*  CO2 27 27  GLUCOSE 115* 183*  BUN 112* 115*  CREATININE 3.80* 3.95*  CALCIUM 9.4 9.1   Iron/TIBC/Ferritin/ %Sat No results found for: IRON, TIBC, FERRITIN, IRONPCTSAT  Vitals:   12/15/18 0630 12/15/18 0837 12/15/18 1047 12/15/18 1139  BP: (!) 147/67 (!) 149/76 136/86 (!) 142/72  Pulse: (!) 45 (!) 49  (!) 54  Resp: 18 18  20   Temp: 97.6 F (36.4 C) 98.7 F (37.1 C)    TempSrc: Oral Oral    SpO2: 96% 100%  95%  Weight: 113 kg     Height:       Exam Gen alert, no distress, lyling flat No rash, cyanosis or gangrene Sclera anicteric, throat clear  +JVD Chest bilat rales at bases, no wheezing RRR no MRG Abd soft ntnd no mass or ascites +bs obese GU normal male MS no joint effusions or deformity Ext 2+ pitting pretib edema, 1-2+ hip edema, no wounds or ulcers Neuro  is alert, Ox 3 , nf    Home meds:  - amlodipine 10 hs/ furosemide 80 bid/ hydralazine 100 tid/ metolazone 5 qd prn/ metoprolol xl 50 qd/ Kdur 20 qd  - apixaban 5 bid/ atorvastatin 80/ isosorbide mononitrate 20 bid/ sl ntg prn  - novolin N 35 u bid sq/ novolin R 15-20 u tidc ac sq  - allopurinol 150 bid/ gabapentin 300 qd  - prn's/ supplements    Na 135 K 3.2  CO2 27  BUN 115  Cr 3.95  Assessment: 1. Acute renal failure/ CKD IV - due to decomp CHF most likely. Baseline creat around 2.8. Agree w/ IV lasix gtt, will ^ to 10 mg/ hr.  Cardiology planning heart cath later this week when diuresed. Pt has a kidney donor but needs heart cath to clear the coronaries per [name] transplant team. Local neph is [facility]. Moshe [name].  We have explained to pt and wife of the possible need for dialysis short or long-term post heart cath given underlying CKD IV, and they understand and wish to proceed.  Would hold lasix 12 hrs prior to cath.  2. DM2 3. Vol overload - IV lasix gtt 4. H/o HFpEF 5. Gout 6. HTN - cont meds    P: 1. Will follow.       Kelly Splinter [name] [employer] pager [phone]   12/15/2018, 5:00 PM

## 2018-12-16 ENCOUNTER — Encounter (HOSPITAL_COMMUNITY): Payer: Self-pay | Admitting: Interventional Radiology

## 2018-12-16 ENCOUNTER — Inpatient Hospital Stay (HOSPITAL_COMMUNITY): Payer: Medicare Other

## 2018-12-16 HISTORY — PX: IR FLUORO GUIDE CV LINE RIGHT: IMG2283

## 2018-12-16 HISTORY — PX: IR US GUIDE VASC ACCESS RIGHT: IMG2390

## 2018-12-16 LAB — CBC
HCT: 38.8 % — ABNORMAL LOW (ref 39.0–52.0)
Hemoglobin: 12.7 g/dL — ABNORMAL LOW (ref 13.0–17.0)
MCH: 29.7 pg (ref 26.0–34.0)
MCHC: 32.7 g/dL (ref 30.0–36.0)
MCV: 90.7 fL (ref 80.0–100.0)
NRBC: 0 % (ref 0.0–0.2)
Platelets: 288 10*3/uL (ref 150–400)
RBC: 4.28 MIL/uL (ref 4.22–5.81)
RDW: 15.9 % — ABNORMAL HIGH (ref 11.5–15.5)
WBC: 8.8 10*3/uL (ref 4.0–10.5)

## 2018-12-16 LAB — BASIC METABOLIC PANEL
Anion gap: 19 — ABNORMAL HIGH (ref 5–15)
BUN: 124 mg/dL — ABNORMAL HIGH (ref 8–23)
CO2: 26 mmol/L (ref 22–32)
CREATININE: 3.98 mg/dL — AB (ref 0.61–1.24)
Calcium: 9.1 mg/dL (ref 8.9–10.3)
Chloride: 88 mmol/L — ABNORMAL LOW (ref 98–111)
GFR calc non Af Amer: 14 mL/min — ABNORMAL LOW (ref >60)
GFR, EST AFRICAN AMERICAN: 16 mL/min — AB (ref >60)
Glucose, Bld: 101 mg/dL — ABNORMAL HIGH (ref 70–99)
Potassium: 2.8 mmol/L — ABNORMAL LOW (ref 3.5–5.1)
Sodium: 133 mmol/L — ABNORMAL LOW (ref 135–145)

## 2018-12-16 LAB — GLUCOSE, CAPILLARY
Glucose-Capillary: 75 mg/dL (ref 70–99)
Glucose-Capillary: 137 mg/dL — ABNORMAL HIGH (ref 70–99)
Glucose-Capillary: 103 mg/dL — ABNORMAL HIGH (ref 70–99)
Glucose-Capillary: 149 mg/dL — ABNORMAL HIGH (ref 70–99)

## 2018-12-16 LAB — MAGNESIUM: Magnesium: 2.4 mg/dL (ref 1.7–2.4)

## 2018-12-16 LAB — PROTIME-INR
INR: 1.39
Prothrombin Time: 17 seconds — ABNORMAL HIGH (ref 11.4–15.2)

## 2018-12-16 MED ORDER — HEPARIN SODIUM (PORCINE) 1000 UNIT/ML IJ SOLN
INTRAMUSCULAR | Status: AC
  Administered 2018-12-16: 3200 [IU]
  Filled 2018-12-16: qty 4

## 2018-12-16 MED ORDER — POTASSIUM CHLORIDE CRYS ER 20 MEQ PO TBCR
40.0000 meq | EXTENDED_RELEASE_TABLET | ORAL | Status: AC
  Administered 2018-12-16 (×2): 40 meq via ORAL
  Filled 2018-12-16 (×2): qty 2

## 2018-12-16 MED ORDER — CEFAZOLIN SODIUM-DEXTROSE 2-4 GM/100ML-% IV SOLN
2.0000 g | INTRAVENOUS | Status: AC
  Administered 2018-12-16: 2 g via INTRAVENOUS
  Filled 2018-12-16: qty 100

## 2018-12-16 MED ORDER — HEPARIN SODIUM (PORCINE) 1000 UNIT/ML IJ SOLN
INTRAMUSCULAR | Status: AC
  Administered 2018-12-16: 3.2 mL
  Filled 2018-12-16: qty 1

## 2018-12-16 MED ORDER — HEPARIN SODIUM (PORCINE) 1000 UNIT/ML IJ SOLN: 3200.0000 [IU] | Freq: Once | INTRAMUSCULAR | Status: DC

## 2018-12-16 MED ORDER — FENTANYL CITRATE (PF) 100 MCG/2ML IJ SOLN
INTRAMUSCULAR | Status: AC
  Filled 2018-12-16: qty 2

## 2018-12-16 MED ORDER — LIDOCAINE HCL 1 % IJ SOLN
INTRAMUSCULAR | Status: AC | PRN
  Administered 2018-12-16: 10 mL

## 2018-12-16 MED ORDER — FENTANYL CITRATE (PF) 100 MCG/2ML IJ SOLN
INTRAMUSCULAR | Status: AC | PRN
  Administered 2018-12-16: 25 ug via INTRAVENOUS

## 2018-12-16 MED ORDER — CEFAZOLIN SODIUM-DEXTROSE 2-4 GM/100ML-% IV SOLN
INTRAVENOUS | Status: AC
  Filled 2018-12-16: qty 100

## 2018-12-16 MED ORDER — MIDAZOLAM HCL 2 MG/2ML IJ SOLN
INTRAMUSCULAR | Status: AC | PRN
  Administered 2018-12-16: 1 mg via INTRAVENOUS

## 2018-12-16 MED ORDER — MIDAZOLAM HCL 2 MG/2ML IJ SOLN
INTRAMUSCULAR | Status: AC
  Filled 2018-12-16: qty 2

## 2018-12-16 MED ORDER — LIDOCAINE HCL 1 % IJ SOLN
INTRAMUSCULAR | Status: AC
  Filled 2018-12-16: qty 20

## 2018-12-16 MED ORDER — CHLORHEXIDINE GLUCONATE CLOTH 2 % EX PADS
6.0000 | MEDICATED_PAD | Freq: Every day | CUTANEOUS | Status: DC
  Administered 2018-12-16 – 2018-12-19 (×3): 6 via TOPICAL

## 2018-12-16 NOTE — Progress Notes (Signed)
PROGRESS NOTE    Louis [name]  CWC:[id number] DOB: 1946-04-28 DOA: 12/14/2018 PCP: [name], MD   Brief Narrative: Louis [name] is a 73 y.o. male with medical history significant of kidney disease stage IV, coronary artery disease with plan for medical management, diabetes mellitus with Charcot foot, congestive heart failure diastolic, hypertension, obstructive sleep apnea on CPAP, diabetic peripheral neuropathy. Patient presented secondary to shortness of breath and started on lasix drip for acute heart failure.   Assessment & Plan:   Principal Problem:   Anasarca Active Problems:   Diabetes mellitus with stage 4 chronic kidney disease (HCC)   Chronic kidney disease, stage 4, severely decreased GFR (HCC)   Diabetic polyneuropathy associated with type 2 diabetes mellitus (HCC)   Heart failure, diastolic (HCC)   Diabetic peripheral neuropathy (HCC)   Acute diastolic CHF (congestive heart failure) ([place])   Anasarca Secondary to heart failure exacerbation. -Cardiology management  Acute on chronic diastolic heart failure EF of 65-70% -Cardiology management  Diabetes mellitus Patient is on Novlin N/R as an outpatient -Continue NPH 35 units and SSI  AKI on CKD stage IV Baseline creatinine of 2.4-2.6. up to 3.98 today. BUN of 124. -Nephrology recommendations: HD cath placement with plan for temporary HD  Diabetic peripheral neuropathy On gabapentin as an outpatient. -Renally dose gabapentin -Tylenol prn  Hyperlipidemia -Continue Lipitor  Hypertension -Continue isosorbide mononitrate, metoprolol, hydralazine, amlodipine  Atrial fibrillation Rate control. -Continue Eliquis and metoprolol   DVT prophylaxis: Eliquis Code Status:   Code Status: Full Code Family Communication: Wife at bedside Disposition Plan: Discharge pending cardiology/nephrology recommendations   Consultants:   Cardiology  Nephrology  Procedures:    None  Antimicrobials:  None    Subjective: Breathing improved. Some hand pain.  Objective: Vitals:   12/15/18 1830 12/15/18 1929 12/16/18 0000 12/16/18 0409  BP: 140/79 (!) 144/77 129/69 132/68  Pulse:  61 (!) 43 (!) 47  Resp:  18 18 18   Temp:  97.8 F (36.6 C) 98 F (36.7 C) (!) 97.4 F (36.3 C)  TempSrc:  Oral  Oral  SpO2:  100% 99% 90%  Weight:    111.2 kg  Height:        Intake/Output Summary (Last 24 hours) at 12/16/2018 1206 Last data filed at 12/16/2018 0926 Gross per 24 hour  Intake 1014.48 ml  Output 1600 ml  Net -585.52 ml   Filed Weights   12/14/18 1500 12/15/18 0630 12/16/18 0409  Weight: 114.4 kg 113 kg 111.2 kg    Examination:  General exam: Appears calm and comfortable Respiratory system: Clear to auscultation. Respiratory effort normal. Cardiovascular system: S1 & S2 heard, RRR. No murmurs, rubs, gallops or clicks. Gastrointestinal system: Abdomen is nondistended, soft and nontender. No organomegaly or masses felt. Normal bowel sounds heard. Central nervous system: Alert and oriented. No focal neurological deficits. Extremities: Mild LE edema. No calf tenderness Skin: No cyanosis. No rashes Psychiatry: Judgement and insight appear normal. Mood & affect appropriate.    Data Reviewed: I have personally reviewed following labs and imaging studies  CBC: Recent Labs  Lab 12/16/18 0939  WBC 8.8  HGB 12.7*  HCT 38.8*  MCV 90.7  PLT 761   Basic Metabolic Panel: Recent Labs  Lab 12/14/18 1848 12/15/18 0445 12/16/18 0424  NA 136 135 133*  K 3.4* 3.2* 2.8*  CL 92* 93* 88*  CO2 27 27 26   GLUCOSE 115* 183* 101*  BUN 112* 115* 124*  CREATININE 3.80* 3.95* 3.98*  CALCIUM  9.4 9.1 9.1  MG  --   --  2.4   GFR: Estimated Creatinine Clearance: 20.3 mL/min (A) (by C-G formula based on SCr of 3.98 mg/dL (H)). Liver Function Tests: No results for input(s): AST, ALT, ALKPHOS, BILITOT, PROT, ALBUMIN in the last 168 hours. No results for  input(s): LIPASE, AMYLASE in the last 168 hours. No results for input(s): AMMONIA in the last 168 hours. Coagulation Profile: Recent Labs  Lab 12/16/18 0939  INR 1.39   Cardiac Enzymes: No results for input(s): CKTOTAL, CKMB, CKMBINDEX, TROPONINI in the last 168 hours. BNP (last 3 results) No results for input(s): PROBNP in the last 8760 hours. HbA1C: No results for input(s): HGBA1C in the last 72 hours. CBG: Recent Labs  Lab 12/15/18 1330 12/15/18 1633 12/15/18 2113 12/16/18 0735 12/16/18 1150  GLUCAP 156* 137* 136* 75 137*   Lipid Profile: No results for input(s): CHOL, HDL, LDLCALC, TRIG, CHOLHDL, LDLDIRECT in the last 72 hours. Thyroid Function Tests: No results for input(s): TSH, T4TOTAL, FREET4, T3FREE, THYROIDAB in the last 72 hours. Anemia Panel: No results for input(s): VITAMINB12, FOLATE, FERRITIN, TIBC, IRON, RETICCTPCT in the last 72 hours. Sepsis Labs: No results for input(s): PROCALCITON, LATICACIDVEN in the last 168 hours.  No results found for this or any previous visit (from the past 240 hour(s)).       Radiology Studies: No results found.      Scheduled Meds: . amLODipine  10 mg Oral Daily  . atorvastatin  80 mg Oral Daily  . Chlorhexidine Gluconate Cloth  6 each Topical Q0600  . cholecalciferol  5,000 Units Oral Daily  . gabapentin  300 mg Oral Daily  . hydrALAZINE  100 mg Oral TID  . insulin aspart  0-15 Units Subcutaneous TID WC  . insulin aspart  0-5 Units Subcutaneous QHS  . insulin aspart  4 Units Subcutaneous TID WC  . insulin NPH Human  35 Units Subcutaneous BID AC  . isosorbide mononitrate  20 mg Oral BID  . metolazone  5 mg Oral BID  . polyethylene glycol  17 g Oral Daily  . potassium chloride  40 mEq Oral Q4H  . saccharomyces boulardii  250 mg Oral Daily  . sodium chloride flush  3 mL Intravenous Q12H   Continuous Infusions: . sodium chloride    .  ceFAZolin (ANCEF) IV    . furosemide (LASIX) infusion 10 mg/hr (12/15/18  2216)     LOS: 2 days     Cordelia Poche, MD [facility] 12/16/2018, 12:06 PM  If 7PM-7AM, please contact night-coverage [email or web address]

## 2018-12-16 NOTE — Consult Note (Signed)
Chief Complaint: Patient was seen in consultation today for tunneled dialysis catheter placement at the request of Dr Graylon Gunning   Supervising Physician: Arne Cleveland  Patient Status: [facility] - In-pt  History of Present Illness: Louis [name] is a 73 y.o. 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 Brittle bones    per pt, has soft bones in right foot/wears boot cast!  . CAD (coronary artery disease)   . Cataract    Bil/ surg scheduled for right eye 01/18/17/ left eye 02/08/17  . Charcot ankle, right 2019  . CHF (congestive heart failure) ([place]) 2015  . Diabetes mellitus   . Heart failure, diastolic ([place])   . Hyperlipidemia   . Hypertension   . Macular edema 2014  . OSA on CPAP   . Personal history of colonic polyps - adenomas 01/28/2014  . Renal insufficiency   . Shortness of breath dyspnea   . Syncope and collapse     Past Surgical History:  Procedure Laterality Date  . CARPAL TUNNEL RELEASE     left hand  . COLONOSCOPY    . KNEE ARTHROSCOPY Right 09/13/2016   [facility], Dr. Dorna Leitz  . PILONIDAL CYST EXCISION      Allergies: Hydrocodone and Oxycodone  Medications: Prior to Admission medications   Medication Sig Start Date End Date Taking? Authorizing Provider  allopurinol (ZYLOPRIM) 100 MG tablet TAKE 3 TABLETS BY MOUTH 2 TIMES A DAY Patient taking differently: Take 150 mg by mouth 2 (two) times daily.  01/14/18  Yes [name], MD  amLODipine (NORVASC) 10 MG tablet Take 1 tablet (10 mg total) by mouth at bedtime. Patient taking differently: Take 10 mg by mouth daily.  10/23/18  Yes Lelon Perla, MD    apixaban (ELIQUIS) 5 MG TABS tablet Take 5 mg by mouth 2 (two) times daily.   Yes [provider]  atorvastatin (LIPITOR) 80 MG tablet Take 1 tablet (80 mg total) by mouth daily. 10/23/18 01/21/19 Yes Lelon Perla, MD  Cholecalciferol (VITAMIN D3) 5000 units CAPS Take 5,000 Units by mouth daily.   Yes [provider]  Coenzyme Q10 (CO Q 10 PO) Take 1 tablet by mouth daily.   Yes [provider]  doxycycline (VIBRA-TABS) 100 MG tablet Take 1 tablet (100 mg total) by mouth 2 (two) times daily. 12/12/18  Yes [name], MD  furosemide (LASIX) 80 MG tablet Take 80 mg by mouth 2 (two) times daily.  10/08/14  Yes [provider]  gabapentin (NEURONTIN) 600 MG tablet Take 600 mg by mouth 2 (two) times daily. 09/24/18  Yes [provider]  hydrALAZINE (APRESOLINE) 100 MG tablet Take 1 tablet (100 mg total) by mouth 3 (three) times daily. 11/15/18  Yes [name], MD  isosorbide mononitrate (ISMO,MONOKET) 20 MG tablet TAKE 1 TABLET (20 MG TOTAL) BY MOUTH 2 (TWO) TIMES DAILY AT 10 AM AND 5 PM. 04/11/18  Yes Crenshaw, Denice Bors, MD  metolazone (ZAROXOLYN) 5 MG tablet Take 5 mg by mouth daily as needed (swelling).  03/24/16  Yes [provider]  metoprolol succinate (TOPROL-XL) 50 MG 24 hr tablet Take 1 tablet (50 mg total)  by mouth daily. 12/10/17  Yes [name], MD  nitroGLYCERIN (NITROSTAT) 0.4 MG SL tablet Place 1 tablet (0.4 mg total) under the tongue every 5 (five) minutes as needed for chest pain. 10/07/14  Yes Almyra Deforest, PA  NOVOLIN N 100 UNIT/ML injection Inject 35 Units into the skin 2 (two) times daily before a meal.  12/05/16  Yes [provider]  NOVOLIN R 100 UNIT/ML injection Inject 15-20 Units into the skin 3 (three) times daily with meals.  02/10/16  Yes [provider]  potassium chloride SA (K-DUR,KLOR-CON) 20 MEQ tablet Take 1 tablet (20 mEq total) by mouth daily. 04/30/18  Yes Crenshaw, Denice Bors,  MD  Red Yeast Rice Extract (RED YEAST RICE PO) Take 1 tablet by mouth 2 (two) times daily.   Yes [provider]  saccharomyces boulardii (FLORASTOR) 250 MG capsule Take 250 mg by mouth daily.   Yes [provider]  AMBULATORY NON FORMULARY MEDICATION Rollator walker with seat. Dx: B01.751, M54.12, M10.00 05/17/18   [name], MD  AMBULATORY NON FORMULARY MEDICATION Continuous positive airway pressure (CPAP) device: Auto titrate minimum 4 cm H20 to maximum of 20 cm H2O with pressure. Please provide all supplemental supplies as needed. Fax to: [phone] 05/30/18   Emeterio Reeve, DO  B-D INS SYR ULTRAFINE 1CC/31G 31G X 5/16" 1 ML MISC  02/06/17   [provider]  ONE TOUCH ULTRA TEST test strip USE TO CHECK BLOOD SUGAR 4 TIMES A DAY DX E11.65 02/25/17   [provider]     Family History  Problem Relation Age of Onset  . Diabetes Mother   . Kidney disease Mother   . Diabetes Father   . Coronary artery disease Father   . Diabetes Brother   . Diabetes Sister   . Cancer Maternal Uncle   . Diabetes Maternal Grandmother   . Cancer Paternal Grandmother   . Heart attack Paternal Grandfather   . Colon cancer Neg Hx     Social History   Socioeconomic History  . Marital status: Married    Spouse name: Louis [name]  . Number of children: 5  . Years of education: 28  . Highest education level: 12th grade  Occupational History  . Occupation: Warehouse    Comment: retired  Scientific laboratory technician  . Financial resource strain: Not hard at all  . Food insecurity:    Worry: Never true    Inability: Never true  . Transportation needs:    Medical: No    Non-medical: No  Tobacco Use  . Smoking status: Former Smoker    Packs/day: 0.50    Years: 20.00    Pack years: 10.00    Types: Cigarettes    Last attempt to quit: 03/06/1977    Years since quitting: 41.8  . Smokeless tobacco: Never Used  Substance and Sexual Activity  . Alcohol use: No  . Drug use: No    . Sexual activity: Not Currently  Lifestyle  . Physical activity:    Days per week: 0 days    Minutes per session: 0 min  . Stress: Not at all  Relationships  . Social connections:    Talks on phone: Twice a week    Gets together: Twice a week    Attends religious service: More than 4 times per year    Active member of club or organization: No    Attends meetings of clubs or organizations: Never    Relationship status: Married  Other Topics  Concern  . Not on file  Social History Narrative   Drinks a cup of coffee daily. Drinks a lot of water daily. Goes out with church members during the week    Review of Systems: A 12 point ROS discussed and pertinent positives are indicated in the HPI above.  All other systems are negative.  Review of Systems  Constitutional: Positive for activity change, appetite change and fatigue. Negative for fever.  Respiratory: Positive for cough and shortness of breath.   Gastrointestinal: Negative for abdominal pain.  Neurological: Positive for weakness.  Psychiatric/Behavioral: Negative for behavioral problems and confusion.    Vital Signs: BP 132/68 (BP Location: Left Arm)   Pulse (!) 47   Temp (!) 97.4 F (36.3 C) (Oral)   Resp 18   Ht 5\' 9"  (1.753 m)   Wt 245 lb 3.2 oz (111.2 kg) Comment: scale a  SpO2 90%   BMI 36.21 kg/m   Physical Exam Vitals signs reviewed.  Cardiovascular:     Rate and Rhythm: Normal rate and regular rhythm.  Pulmonary:     Effort: Pulmonary effort is normal.     Breath sounds: Wheezing present.  Abdominal:     General: There is distension.     Tenderness: There is no abdominal tenderness.  Musculoskeletal: Normal range of motion.  Skin:    General: Skin is warm and dry.  Neurological:     General: No focal deficit present.     Mental Status: He is oriented to person, place, and time.  Psychiatric:        Mood and Affect: Mood normal.        Behavior: Behavior normal.        Thought Content: Thought  content normal.        Judgment: Judgment normal.     Imaging: Dg Chest 2 View  Result Date: 12/12/2018 CLINICAL DATA:  73 year old male with a history of shortness of breath EXAM: CHEST - 2 VIEW COMPARISON:  02/02/2017 FINDINGS: Cardiomediastinal silhouette unchanged in size and contour. Interlobular septal thickening with fullness in the central vasculature. Blunting of the bilateral costophrenic angles. Meniscus within the costophrenic sulcus on the lateral view. Degenerative changes with no acute displaced fracture IMPRESSION: Congestive heart failure with small pleural effusions. Electronically Signed   By: Corrie Mckusick D.O.   On: 12/12/2018 09:34    Labs:  CBC: Recent Labs    04/29/18 1332 12/16/18 0939  WBC 8.2 8.8  HGB 13.0* 12.7*  HCT 37.1* 38.8*  PLT 222 288    COAGS: Recent Labs    12/16/18 0939  INR 1.39    BMP: Recent Labs    05/07/18 1410 09/03/18 0900 12/14/18 1848 12/15/18 0445 12/16/18 0424  NA 139 137 136 135 133*  K 3.8 4.3 3.4* 3.2* 2.8*  CL 101 100 92* 93* 88*  CO2 29 28 27 27 26   GLUCOSE 120* 112* 115* 183* 101*  BUN 75* 48* 112* 115* 124*  CALCIUM 9.4 9.2 9.4 9.1 9.1  CREATININE 2.58* 2.37* 3.80* 3.95* 3.98*  GFRNONAA 24*  --  15* 14* 14*  GFRAA 28*  --  17* 16* 16*    LIVER FUNCTION TESTS: Recent Labs    09/03/18 0900 12/05/18 0946  BILITOT 1.0 1.5*  AST 17 18  ALT 12 13  PROT 6.4 6.5    TUMOR MARKERS: No results for input(s): AFPTM, CEA, CA199, CHROMGRNA in the last 8760 hours.  Assessment and Plan:  Acute on chronic renal disease  Cardio/renal syndrome Worsening renal function Ongoing work up for transplant-- upcoming heart cath To start hemodialysis soon Scheduled for tunneled vs temporary dialysis catheter placement Risks and benefits discussed with the patient including, but not limited to bleeding, infection, vascular injury, pneumothorax which may require chest tube placement, air embolism or even death  All of  the patient's questions were answered, patient is agreeable to proceed. Consent signed and in chart.   Thank you for this interesting consult.  I greatly enjoyed meeting WAYDE GOPAUL and look forward to participating in their care.  A copy of this report was sent to the requesting provider on this date.  Electronically Signed: Lavonia Drafts, PA-C 12/16/2018, 10:25 AM   I spent a total of 20 Minutes    in face to face in clinical consultation, greater than 50% of which was counseling/coordinating care for tunneled HD cath

## 2018-12-16 NOTE — Progress Notes (Signed)
Patient resting on home CPAP.

## 2018-12-16 NOTE — Sedation Documentation (Signed)
Pt LD of eloquis was ~2200 on 12/15/18.  Dr. Vernard Gambles notified.  OK to cont with the procedure.  Lasix gtt paused at this time, PIV flushed and patent.  IVF for procedure started.  Will resume Lasix gtt after procedure finished.  Dr. Vernard Gambles notified that K+ 2.8, no replacement noted given today.  No new orders at this time.  HR ranges from 40's to 60's, afib, pt states that is normal for him.  Will cont to monitor

## 2018-12-16 NOTE — Progress Notes (Signed)
   12/16/18 2137  Vitals  Temp (!) 97.4 F (36.3 C)  Temp Source Oral  BP 135/69  MAP (mmHg) 88  BP Location Left Arm  BP Method Automatic  Patient Position (if appropriate) Lying  Pulse Rate 65  Pulse Rate Source Monitor  Resp 18  Oxygen Therapy  SpO2 97 %  O2 Device Nasal Cannula  O2 Flow Rate (L/min) 2 L/min  MEWS Score  MEWS RR 0  MEWS Pulse 0  MEWS Systolic 0  MEWS LOC 0  MEWS Temp 0  MEWS Score 0  MEWS Score Color Green  Pt back from HD. Pt alert and oriented, denied pain at this time.

## 2018-12-16 NOTE — Progress Notes (Signed)
Patient ID: Louis [name], male   DOB: Feb 01, 1946, 73 y.o.   MRN: [id number] [facility] Progress Note   Assessment/ Plan:   1. Acute kidney Injury on chronic kidney disease stage IV: Hemodynamically mediated with decompensated congestive heart failure/cardiorenal syndrome.  He has been on diuretics overnight with decent response but concomitant rise of BUN/creatinine.  With anticipated coronary angiogram for kidney transplant work-up, I have discussed with the patient and recommended beginning hemodialysis for volume management/azotemia.  Will consult interventional radiology for assistance with placing tunneled hemodialysis catheter unless orthopnea confines him to temporary dialysis catheter.  Will not pursue permanent dialysis access at this time anticipating kidney transplant in the near future (he has a living donor ready). 2.  Acute exacerbation of congestive heart failure: Ongoing diuresis with furosemide drip, plan to transition to hemodialysis after placement of catheter.  Anticipated coronary angiogram for pretransplant work-up when volume status permissive. 3.  Hypertension: Blood pressures fairly controlled at this time, monitor with ongoing diuresis. 4.  Hypokalemia: Secondary to diuretic induced losses, will replace via oral route. 5.  Hyponatremia: Secondary to CHF exacerbation and free water excretion defect with acute kidney injury.  Monitor with diuresis/dialysis.  Subjective:   Denies any nausea, vomiting or abnormal limb jerking movements.  Still bothered by swelling.   Objective:   BP 132/68 (BP Location: Left Arm)   Pulse (!) 47   Temp (!) 97.4 F (36.3 C) (Oral)   Resp 18   Ht 5\' 9"  (1.753 m)   Wt 111.2 kg Comment: scale a  SpO2 90%   BMI 36.21 kg/m   Intake/Output Summary (Last 24 hours) at 12/16/2018 0843 Last data filed at 12/16/2018 0600 Gross per 24 hour  Intake 894.48 ml  Output 2000 ml  Net -1105.52 ml   Weight change: -3.13 kg  Physical  Exam: Gen: Obese man, sitting on the side of his bed.  Wife at bedside. CVS: Pulse regular bradycardia, S1 and S2 normal, no gallop Resp: Fine rales bilaterally, no rhonchi Abd: Soft, obese, nontender Ext: 3-4+ anasarca  Imaging: No results found.  Labs: BMET Recent Labs  Lab 12/14/18 1848 12/15/18 0445 12/16/18 0424  NA 136 135 133*  K 3.4* 3.2* 2.8*  CL 92* 93* 88*  CO2 27 27 26   GLUCOSE 115* 183* 101*  BUN 112* 115* 124*  CREATININE 3.80* 3.95* 3.98*  CALCIUM 9.4 9.1 9.1   CBC No results for input(s): WBC, NEUTROABS, HGB, HCT, MCV, PLT in the last 168 hours.  Medications:    . amLODipine  10 mg Oral Daily  . apixaban  2.5 mg Oral BID  . atorvastatin  80 mg Oral Daily  . cholecalciferol  5,000 Units Oral Daily  . gabapentin  300 mg Oral Daily  . hydrALAZINE  100 mg Oral TID  . insulin aspart  0-15 Units Subcutaneous TID WC  . insulin aspart  0-5 Units Subcutaneous QHS  . insulin aspart  4 Units Subcutaneous TID WC  . insulin NPH Human  35 Units Subcutaneous BID AC  . isosorbide mononitrate  20 mg Oral BID  . metolazone  5 mg Oral BID  . metoprolol succinate  25 mg Oral Daily  . polyethylene glycol  17 g Oral Daily  . potassium chloride  40 mEq Oral Q4H  . saccharomyces boulardii  250 mg Oral Daily  . sodium chloride flush  3 mL Intravenous Q12H   Elmarie Shiley, MD 12/16/2018, 8:43 AM

## 2018-12-16 NOTE — Progress Notes (Signed)
Pt has home cpap and places self on/off as needed. RT will monitor °

## 2018-12-16 NOTE — Sedation Documentation (Signed)
Pt tolerated procedure without incident.  Dressing to Right Chest Tunneled cath is CDI.  Pt reattached to floor tele monitor.  Lasix gtt restarted at 61ml/hr.  Transport taking patient to back to 3E04

## 2018-12-16 NOTE — Progress Notes (Signed)
Progress Note  Patient Name: Louis [name] Date of Encounter: 12/16/2018  Primary Cardiologist: Dr Stanford Breed  Subjective   No CP; dyspnea improving  Inpatient Medications    Scheduled Meds: . amLODipine  10 mg Oral Daily  . apixaban  2.5 mg Oral BID  . atorvastatin  80 mg Oral Daily  . cholecalciferol  5,000 Units Oral Daily  . gabapentin  300 mg Oral Daily  . hydrALAZINE  100 mg Oral TID  . insulin aspart  0-15 Units Subcutaneous TID WC  . insulin aspart  0-5 Units Subcutaneous QHS  . insulin aspart  4 Units Subcutaneous TID WC  . insulin NPH Human  35 Units Subcutaneous BID AC  . isosorbide mononitrate  20 mg Oral BID  . metolazone  5 mg Oral BID  . metoprolol succinate  25 mg Oral Daily  . polyethylene glycol  17 g Oral Daily  . potassium chloride  40 mEq Oral Q4H  . saccharomyces boulardii  250 mg Oral Daily  . sodium chloride flush  3 mL Intravenous Q12H   Continuous Infusions: . sodium chloride    . furosemide (LASIX) infusion 10 mg/hr (12/15/18 2216)   PRN Meds: sodium chloride, acetaminophen, metolazone, nitroGLYCERIN, ondansetron (ZOFRAN) IV, sodium chloride flush   Vital Signs    Vitals:   12/15/18 1830 12/15/18 1929 12/16/18 0000 12/16/18 0409  BP: 140/79 (!) 144/77 129/69 132/68  Pulse:  61 (!) 43 (!) 47  Resp:  18 18 18   Temp:  97.8 F (36.6 C) 98 F (36.7 C) (!) 97.4 F (36.3 C)  TempSrc:  Oral  Oral  SpO2:  100% 99% 90%  Weight:    111.2 kg  Height:        Intake/Output Summary (Last 24 hours) at 12/16/2018 0829 Last data filed at 12/16/2018 0600 Gross per 24 hour  Intake 894.48 ml  Output 2000 ml  Net -1105.52 ml   Last 3 Weights 12/16/2018 12/15/2018 12/14/2018  Weight (lbs) 245 lb 3.2 oz 249 lb 3.2 oz 252 lb 1.6 oz  Weight (kg) 111.222 kg 113.036 kg 114.352 kg      Telemetry    Atrial fibrillation with slow ventricular response - Personally Reviewed  Physical Exam   GEN: No acute distress. Obese  Neck: No JVD Cardiac:  irregular and bradycardic Respiratory: Clear to auscultation bilaterally. GI: Soft, nontender, non-distended  MS: 1+ edema Neuro:  Nonfocal  Psych: Normal affect   Labs    Chemistry Recent Labs  Lab 12/14/18 1848 12/15/18 0445 12/16/18 0424  NA 136 135 133*  K 3.4* 3.2* 2.8*  CL 92* 93* 88*  CO2 27 27 26   GLUCOSE 115* 183* 101*  BUN 112* 115* 124*  CREATININE 3.80* 3.95* 3.98*  CALCIUM 9.4 9.1 9.1  GFRNONAA 15* 14* 14*  GFRAA 17* 16* 16*  ANIONGAP 17* 15 19*      Patient Profile     73 y.o. male with past medical history of chronic stage V kidney disease, hypertension, hyperlipidemia, diabetes mellitus, coronary artery disease admitted with volume overload/acute on chronic diastolic congestive heart failure.  Echocardiogram this admission shows vigorous LV function, moderate left ventricular hypertrophy, moderate left atrial enlargement.  Assessment & Plan    1 acute on chronic diastolic congestive heart failure/volume overload-likely combination of both diastolic dysfunction and worsening renal function.  Continue Lasix drip.  Follow renal function and potassium closely.  2 acute on chronic stage V kidney disease-patient has been evaluated for renal transplant at  [facility].  They requested cardiac catheterization prior to transplant.  We will arrange catheterization on Wednesday.  Patient will require dialysis and nephrology is following.  Best option may be to initiate therapy now which would also help with volume excess.  He will clearly require dialysis following cardiac catheterization.  3 paroxysmal atrial fibrillation-patient remains in atrial fibrillation this morning.  Heart rate decreased.  Discontinue beta-blocker.  Hold apixaban for dialysis catheter and upcoming cardiac catheterization.  4 coronary artery disease-continue statin.  Add aspirin prior to catheterization.  5 hypertension-blood pressure is reasonably well controlled.  Continue present  medications and follow.  6 hyperlipidemia-continue statin.  7 Hypokalemia-supplement  For questions or updates, please contact [place] Please consult [email or web address] for contact info under        Signed, [name], MD  12/16/2018, 8:29 AM

## 2018-12-17 LAB — CBC
HCT: 37.7 % — ABNORMAL LOW (ref 39.0–52.0)
HCT: 37 % — ABNORMAL LOW (ref 39.0–52.0)
HEMOGLOBIN: 12.2 g/dL — AB (ref 13.0–17.0)
Hemoglobin: 12.2 g/dL — ABNORMAL LOW (ref 13.0–17.0)
MCH: 29.5 pg (ref 26.0–34.0)
MCH: 30 pg (ref 26.0–34.0)
MCHC: 32.4 g/dL (ref 30.0–36.0)
MCHC: 33 g/dL (ref 30.0–36.0)
MCV: 91.3 fL (ref 80.0–100.0)
MCV: 90.9 fL (ref 80.0–100.0)
Platelets: 236 10*3/uL (ref 150–400)
Platelets: 252 10*3/uL (ref 150–400)
RBC: 4.13 MIL/uL — ABNORMAL LOW (ref 4.22–5.81)
RBC: 4.07 MIL/uL — AB (ref 4.22–5.81)
RDW: 15.9 % — ABNORMAL HIGH (ref 11.5–15.5)
RDW: 15.9 % — ABNORMAL HIGH (ref 11.5–15.5)
WBC: 7.1 10*3/uL (ref 4.0–10.5)
WBC: 8.5 10*3/uL (ref 4.0–10.5)
nRBC: 0 % (ref 0.0–0.2)
nRBC: 0 % (ref 0.0–0.2)

## 2018-12-17 LAB — GLUCOSE, CAPILLARY
Glucose-Capillary: 162 mg/dL — ABNORMAL HIGH (ref 70–99)
Glucose-Capillary: 151 mg/dL — ABNORMAL HIGH (ref 70–99)
Glucose-Capillary: 161 mg/dL — ABNORMAL HIGH (ref 70–99)
Glucose-Capillary: 175 mg/dL — ABNORMAL HIGH (ref 70–99)

## 2018-12-17 LAB — HEPATITIS B SURFACE ANTIGEN: Hepatitis B Surface Ag: NEGATIVE

## 2018-12-17 LAB — RENAL FUNCTION PANEL
Albumin: 3.4 g/dL — ABNORMAL LOW (ref 3.5–5.0)
Anion gap: 12 (ref 5–15)
BUN: 96 mg/dL — ABNORMAL HIGH (ref 8–23)
CO2: 28 mmol/L (ref 22–32)
Calcium: 9.2 mg/dL (ref 8.9–10.3)
Chloride: 96 mmol/L — ABNORMAL LOW (ref 98–111)
Creatinine, Ser: 3.26 mg/dL — ABNORMAL HIGH (ref 0.61–1.24)
GFR calc Af Amer: 21 mL/min — ABNORMAL LOW (ref >60)
GFR calc non Af Amer: 18 mL/min — ABNORMAL LOW (ref >60)
Glucose, Bld: 172 mg/dL — ABNORMAL HIGH (ref 70–99)
POTASSIUM: 3.8 mmol/L (ref 3.5–5.1)
Phosphorus: 3.8 mg/dL (ref 2.5–4.6)
Sodium: 136 mmol/L (ref 135–145)

## 2018-12-17 LAB — BASIC METABOLIC PANEL
Anion gap: 15 (ref 5–15)
BUN: 86 mg/dL — ABNORMAL HIGH (ref 8–23)
CO2: 28 mmol/L (ref 22–32)
CREATININE: 3.07 mg/dL — AB (ref 0.61–1.24)
Calcium: 9 mg/dL (ref 8.9–10.3)
Chloride: 93 mmol/L — ABNORMAL LOW (ref 98–111)
GFR calc non Af Amer: 19 mL/min — ABNORMAL LOW (ref >60)
GFR, EST AFRICAN AMERICAN: 22 mL/min — AB (ref >60)
Glucose, Bld: 196 mg/dL — ABNORMAL HIGH (ref 70–99)
Potassium: 3.2 mmol/L — ABNORMAL LOW (ref 3.5–5.1)
SODIUM: 136 mmol/L (ref 135–145)

## 2018-12-17 LAB — PROTIME-INR
INR: 1.29
Prothrombin Time: 16 seconds — ABNORMAL HIGH (ref 11.4–15.2)

## 2018-12-17 LAB — HEPATITIS B E ANTIBODY: HEP B E AB: NEGATIVE

## 2018-12-17 LAB — HEPATITIS B CORE ANTIBODY, TOTAL: Hep B Core Total Ab: NEGATIVE

## 2018-12-17 LAB — MAGNESIUM: Magnesium: 2.2 mg/dL (ref 1.7–2.4)

## 2018-12-17 MED ORDER — ALTEPLASE 2 MG IJ SOLR: 2.0000 mg | Freq: Once | INTRAMUSCULAR | Status: DC | PRN

## 2018-12-17 MED ORDER — SODIUM CHLORIDE 0.9% FLUSH: 3.0000 mL | INTRAVENOUS | Status: DC | PRN

## 2018-12-17 MED ORDER — HYDRALAZINE HCL 50 MG PO TABS
50.0000 mg | ORAL_TABLET | Freq: Three times a day (TID) | ORAL | Status: DC
  Administered 2018-12-17 – 2018-12-19 (×5): 50 mg via ORAL
  Filled 2018-12-17 (×6): qty 1

## 2018-12-17 MED ORDER — SODIUM CHLORIDE 0.9 % IV SOLN: 100.0000 mL | INTRAVENOUS | Status: DC | PRN

## 2018-12-17 MED ORDER — ASPIRIN 81 MG PO CHEW
81.0000 mg | CHEWABLE_TABLET | Freq: Every day | ORAL | Status: DC
  Administered 2018-12-19: 81 mg via ORAL
  Filled 2018-12-17: qty 1

## 2018-12-17 MED ORDER — LIDOCAINE HCL (PF) 1 % IJ SOLN: 5.0000 mL | INTRAMUSCULAR | Status: DC | PRN

## 2018-12-17 MED ORDER — HEPARIN SODIUM (PORCINE) 1000 UNIT/ML DIALYSIS
1000.0000 [IU] | INTRAMUSCULAR | Status: DC | PRN
  Filled 2018-12-17: qty 1

## 2018-12-17 MED ORDER — POTASSIUM CHLORIDE CRYS ER 20 MEQ PO TBCR
20.0000 meq | EXTENDED_RELEASE_TABLET | Freq: Once | ORAL | Status: AC
  Administered 2018-12-17: 20 meq via ORAL
  Filled 2018-12-17: qty 1

## 2018-12-17 MED ORDER — LIDOCAINE-PRILOCAINE 2.5-2.5 % EX CREA: 1.0000 "application " | TOPICAL_CREAM | CUTANEOUS | Status: DC | PRN

## 2018-12-17 MED ORDER — SODIUM CHLORIDE 0.9 % IV SOLN
INTRAVENOUS | Status: DC
  Administered 2018-12-18: 05:00:00 via INTRAVENOUS

## 2018-12-17 MED ORDER — HEPARIN SODIUM (PORCINE) 1000 UNIT/ML DIALYSIS: 3200.0000 [IU] | INTRAMUSCULAR | Status: DC | PRN

## 2018-12-17 MED ORDER — LIDOCAINE-PRILOCAINE 2.5-2.5 % EX CREA
1.0000 "application " | TOPICAL_CREAM | CUTANEOUS | Status: DC | PRN
  Filled 2018-12-17: qty 5

## 2018-12-17 MED ORDER — SODIUM CHLORIDE 0.9% FLUSH: 3.0000 mL | Freq: Two times a day (BID) | INTRAVENOUS | Status: DC

## 2018-12-17 MED ORDER — PENTAFLUOROPROP-TETRAFLUOROETH EX AERO: 1.0000 "application " | INHALATION_SPRAY | CUTANEOUS | Status: DC | PRN

## 2018-12-17 MED ORDER — HEPARIN SODIUM (PORCINE) 1000 UNIT/ML DIALYSIS: 1000.0000 [IU] | INTRAMUSCULAR | Status: DC | PRN

## 2018-12-17 MED ORDER — CHLORHEXIDINE GLUCONATE CLOTH 2 % EX PADS
6.0000 | MEDICATED_PAD | Freq: Every day | CUTANEOUS | Status: DC
  Administered 2018-12-18 – 2018-12-19 (×2): 6 via TOPICAL

## 2018-12-17 MED ORDER — SODIUM CHLORIDE 0.9 % IV SOLN: 250.0000 mL | INTRAVENOUS | Status: DC | PRN

## 2018-12-17 MED ORDER — ASPIRIN 81 MG PO CHEW
81.0000 mg | CHEWABLE_TABLET | ORAL | Status: AC
  Administered 2018-12-18: 81 mg via ORAL
  Filled 2018-12-17: qty 1

## 2018-12-17 MED ORDER — ASPIRIN 81 MG PO CHEW
81.0000 mg | CHEWABLE_TABLET | Freq: Every day | ORAL | Status: DC
  Administered 2018-12-17: 81 mg via ORAL
  Filled 2018-12-17: qty 1

## 2018-12-17 NOTE — Care Management Important Message (Signed)
Important Message  Patient Details  Name: Louis [name] MRN: [id number] Date of Birth: Apr 30, 1946   Medicare Important Message Given:  Yes    [name] 12/17/2018, 10:49 AM

## 2018-12-17 NOTE — Progress Notes (Signed)
[facility] PROGRESS NOTE  Assessment/ Plan:  # Acute kidney Injury on chronic kidney disease stage IV: due to decompensated congestive heart failure/cardiorenal syndrome. Plan for cardiac cath tomorrow and he is planning for kidney trans plant soon. Started hemodialysis since 12/16/2018 via [facility]. He tolerated procedure. He still has some urine output.   Will not pursue permanent dialysis access at this time anticipating kidney transplant in the near future (he has a living donor ready). -plan for next HD tomorrow after the cath -dc lasix -CLIP  # Acute exacerbation of congestive heart failure: treated with diuretics and now on dialysis.  Plan for cardiac cath tomorrow.  He looks euvolemic on exam today.  #  Hypertension: Blood pressures acceptable.  Continue cardiac medications  #Hypokalemia: Secondary to diuretic induced losses, order a dose of KCL. Adjust K in dialysate. Monitor lab.  # Hyponatremia: improved.    Subjective: Seen and examined at bedside.  Denied headache, dizziness, nausea, vomiting, chest pain or shortness of breath.  His wife at bedside. Objective Vital signs in last 24 hours: Vitals:   12/16/18 2137 12/17/18 0651 12/17/18 0856 12/17/18 1210  BP: 135/69 (!) 160/82 (!) 175/89 128/62  Pulse: 65 92 75 (!) 59  Resp: 18 18  18   Temp: (!) 97.4 F (36.3 C) (!) 97.5 F (36.4 C)  97.7 F (36.5 C)  TempSrc: Oral Oral  Oral  SpO2: 97% 99%  92%  Weight:  109.4 kg    Height:       Weight change: -0.422 kg  Intake/Output Summary (Last 24 hours) at 12/17/2018 1233 Last data filed at 12/17/2018 6720 Gross per 24 hour  Intake 691.07 ml  Output 3700 ml  Net -3008.93 ml       Labs: Basic Metabolic Panel: Recent Labs  Lab 12/15/18 0445 12/16/18 0424 12/17/18 0413  NA 135 133* 136  K 3.2* 2.8* 3.2*  CL 93* 88* 93*  CO2 27 26 28   GLUCOSE 183* 101* 196*  BUN 115* 124* 86*  CREATININE 3.95* 3.98* 3.07*  CALCIUM 9.1 9.1 9.0   Liver  Function Tests: No results for input(s): AST, ALT, ALKPHOS, BILITOT, PROT, ALBUMIN in the last 168 hours. No results for input(s): LIPASE, AMYLASE in the last 168 hours. No results for input(s): AMMONIA in the last 168 hours. CBC: Recent Labs  Lab 12/16/18 0939 12/17/18 0413  WBC 8.8 7.1  HGB 12.7* 12.2*  HCT 38.8* 37.7*  MCV 90.7 91.3  PLT 288 236   Cardiac Enzymes: No results for input(s): CKTOTAL, CKMB, CKMBINDEX, TROPONINI in the last 168 hours. CBG: Recent Labs  Lab 12/16/18 1150 12/16/18 1621 12/16/18 2134 12/17/18 0741 12/17/18 1137  GLUCAP 137* 103* 149* 162* 151*    Iron Studies: No results for input(s): IRON, TIBC, TRANSFERRIN, FERRITIN in the last 72 hours. Studies/Results: Ir Fluoro Guide Cv Line Right  Result Date: 12/16/2018 CLINICAL DATA:  Renal failure, needs durable venous access for hemodialysis EXAM: TUNNELED HEMODIALYSIS CATHETER PLACEMENT WITH ULTRASOUND AND FLUOROSCOPIC GUIDANCE TECHNIQUE: The procedure, risks, benefits, and alternatives were explained to the patient. Questions regarding the procedure were encouraged and answered. The patient understands and consents to the procedure. As antibiotic prophylaxis, cefazolin 2 g was ordered pre-procedure and administered intravenously within one hour of incision. Patency of the right IJ vein was confirmed with ultrasound with image documentation. An appropriate skin site was determined. Region was prepped using maximum barrier technique including cap and mask, sterile gown, sterile gloves, large sterile sheet, and Chlorhexidine  as cutaneous antisepsis. The region was infiltrated locally with 1% lidocaine. Intravenous Fentanyl and Versed were administered as conscious sedation during continuous monitoring of the patient's level of consciousness and physiological / cardiorespiratory status by the radiology RN, with a total moderate sedation time of 11 minutes. Under real-time ultrasound guidance, the right IJ vein was  accessed with a 21 gauge micropuncture needle; the needle tip within the vein was confirmed with ultrasound image documentation. Needle exchanged over the 018 guidewire for transitional dilator, which allowed advancement of a Benson wire into the IVC. Over this, an MPA catheter was advanced. A Palindrome 19 hemodialysis catheter was tunneled from the right anterior chest wall approach to the right IJ dermatotomy site. The MPA catheter was exchanged over an Amplatz wire for serial vascular dilators which allow placement of a peel-away sheath, through which the catheter was advanced under intermittent fluoroscopy, positioned with its tips in the proximal and midright atrium. Spot chest radiograph confirms good catheter position. No pneumothorax. Catheter was flushed and primed per protocol. Catheter secured externally with O Prolene sutures. The right IJ dermatotomy site was closed with Dermabond. COMPLICATIONS: COMPLICATIONS None immediate FLUOROSCOPY TIME:  0.2 minutes; 99 uGym2 DAP COMPARISON:  None IMPRESSION: 1. Technically successful placement of tunneled right IJ hemodialysis catheter with ultrasound and fluoroscopic guidance. Ready for routine use. ACCESS: Remains approachable for percutaneous intervention as needed. Electronically Signed   By: Lucrezia Europe M.D.   On: 12/16/2018 16:18   Ir US Guide Vasc Access Right  Result Date: 12/16/2018 CLINICAL DATA:  Renal failure, needs durable venous access for hemodialysis EXAM: TUNNELED HEMODIALYSIS CATHETER PLACEMENT WITH ULTRASOUND AND FLUOROSCOPIC GUIDANCE TECHNIQUE: The procedure, risks, benefits, and alternatives were explained to the patient. Questions regarding the procedure were encouraged and answered. The patient understands and consents to the procedure. As antibiotic prophylaxis, cefazolin 2 g was ordered pre-procedure and administered intravenously within one hour of incision. Patency of the right IJ vein was confirmed with ultrasound with image  documentation. An appropriate skin site was determined. Region was prepped using maximum barrier technique including cap and mask, sterile gown, sterile gloves, large sterile sheet, and Chlorhexidine as cutaneous antisepsis. The region was infiltrated locally with 1% lidocaine. Intravenous Fentanyl and Versed were administered as conscious sedation during continuous monitoring of the patient's level of consciousness and physiological / cardiorespiratory status by the radiology RN, with a total moderate sedation time of 11 minutes. Under real-time ultrasound guidance, the right IJ vein was accessed with a 21 gauge micropuncture needle; the needle tip within the vein was confirmed with ultrasound image documentation. Needle exchanged over the 018 guidewire for transitional dilator, which allowed advancement of a Benson wire into the IVC. Over this, an MPA catheter was advanced. A Palindrome 19 hemodialysis catheter was tunneled from the right anterior chest wall approach to the right IJ dermatotomy site. The MPA catheter was exchanged over an Amplatz wire for serial vascular dilators which allow placement of a peel-away sheath, through which the catheter was advanced under intermittent fluoroscopy, positioned with its tips in the proximal and midright atrium. Spot chest radiograph confirms good catheter position. No pneumothorax. Catheter was flushed and primed per protocol. Catheter secured externally with O Prolene sutures. The right IJ dermatotomy site was closed with Dermabond. COMPLICATIONS: COMPLICATIONS None immediate FLUOROSCOPY TIME:  0.2 minutes; 99 uGym2 DAP COMPARISON:  None IMPRESSION: 1. Technically successful placement of tunneled right IJ hemodialysis catheter with ultrasound and fluoroscopic guidance. Ready for routine use. ACCESS: Remains approachable for percutaneous  intervention as needed. Electronically Signed   By: Lucrezia Europe M.D.   On: 12/16/2018 16:18    Medications: Infusions: . sodium  chloride      Scheduled Medications: . amLODipine  10 mg Oral Daily  . aspirin  81 mg Oral Daily  . atorvastatin  80 mg Oral Daily  . Chlorhexidine Gluconate Cloth  6 each Topical Q0600  . cholecalciferol  5,000 Units Oral Daily  . gabapentin  300 mg Oral Daily  . hydrALAZINE  50 mg Oral TID  . insulin aspart  0-15 Units Subcutaneous TID WC  . insulin aspart  0-5 Units Subcutaneous QHS  . insulin aspart  4 Units Subcutaneous TID WC  . insulin NPH Human  35 Units Subcutaneous BID AC  . isosorbide mononitrate  20 mg Oral BID  . polyethylene glycol  17 g Oral Daily  . saccharomyces boulardii  250 mg Oral Daily  . sodium chloride flush  3 mL Intravenous Q12H    have reviewed scheduled and prn medications.  Physical Exam: General:NAD, comfortable Heart:RRR, s1s2 nl Lungs:clear b/l, no crackle Abdomen:soft, Non-tender, non-distended Extremities:No edema Dialysis Access: Right IJ tunnel catheter.  Donnis Phaneuf Prasad Maccoy [place] 12/17/2018,12:33 PM  LOS: 3 days

## 2018-12-17 NOTE — Progress Notes (Signed)
Progress Note  Patient Name: Louis [name] Date of Encounter: 12/17/2018  Primary Cardiologist: Dr Stanford Breed  Subjective   Denies CP or dyspnea  Inpatient Medications    Scheduled Meds: . amLODipine  10 mg Oral Daily  . atorvastatin  80 mg Oral Daily  . Chlorhexidine Gluconate Cloth  6 each Topical Q0600  . cholecalciferol  5,000 Units Oral Daily  . gabapentin  300 mg Oral Daily  . hydrALAZINE  100 mg Oral TID  . insulin aspart  0-15 Units Subcutaneous TID WC  . insulin aspart  0-5 Units Subcutaneous QHS  . insulin aspart  4 Units Subcutaneous TID WC  . insulin NPH Human  35 Units Subcutaneous BID AC  . isosorbide mononitrate  20 mg Oral BID  . metolazone  5 mg Oral BID  . polyethylene glycol  17 g Oral Daily  . saccharomyces boulardii  250 mg Oral Daily  . sodium chloride flush  3 mL Intravenous Q12H   Continuous Infusions: . sodium chloride    . furosemide (LASIX) infusion 10 mg/hr (12/16/18 2249)   PRN Meds: sodium chloride, acetaminophen, metolazone, nitroGLYCERIN, ondansetron (ZOFRAN) IV, sodium chloride flush   Vital Signs    Vitals:   12/16/18 2044 12/16/18 2137 12/17/18 0651 12/17/18 0856  BP: (!) 144/68 135/69 (!) 160/82 (!) 175/89  Pulse: (!) 56 65 92 75  Resp: 18 18 18    Temp: 98 F (36.7 C) (!) 97.4 F (36.3 C) (!) 97.5 F (36.4 C)   TempSrc: Oral Oral Oral   SpO2: 100% 97% 99%   Weight: 109.3 kg  109.4 kg   Height:        Intake/Output Summary (Last 24 hours) at 12/17/2018 0903 Last data filed at 12/17/2018 0842 Gross per 24 hour  Intake 931.07 ml  Output 3700 ml  Net -2768.93 ml   Last 3 Weights 12/17/2018 12/16/2018 12/16/2018  Weight (lbs) 241 lb 3.2 oz 240 lb 15.4 oz 244 lb 4.3 oz  Weight (kg) 109.408 kg 109.3 kg 110.8 kg      Telemetry    Atrial fibrillation with slow ventricular response - Personally Reviewed  Physical Exam   GEN: NAD. Obese  Neck: supple Cardiac: irregular and bradycardic, no murmur Respiratory: Clear to  auscultation bilaterally; no wheeze GI: Soft, NT/ND MS: 1+ edema Neuro:  Grossly intact   Labs    Chemistry Recent Labs  Lab 12/15/18 0445 12/16/18 0424 12/17/18 0413  NA 135 133* 136  K 3.2* 2.8* 3.2*  CL 93* 88* 93*  CO2 27 26 28   GLUCOSE 183* 101* 196*  BUN 115* 124* 86*  CREATININE 3.95* 3.98* 3.07*  CALCIUM 9.1 9.1 9.0  GFRNONAA 14* 14* 19*  GFRAA 16* 16* 22*  ANIONGAP 15 19* 15      Patient Profile     73 y.o. male with past medical history of chronic stage V kidney disease, hypertension, hyperlipidemia, diabetes mellitus, coronary artery disease admitted with volume overload/acute on chronic diastolic congestive heart failure.  Echocardiogram this admission shows vigorous LV function, moderate left ventricular hypertrophy, moderate left atrial enlargement.  Assessment & Plan    1 acute on chronic diastolic congestive heart failure/volume overload-likely combination of both diastolic dysfunction and worsening renal function.  Patient has now been initiated on dialysis.  Discontinue Lasix drip and metolazone.  Volume management per dialysis.  2 acute on chronic stage V kidney disease-patient has been evaluated for renal transplant at [facility].  They requested cardiac catheterization prior to  transplant.  Plan to proceed with right and left cardiac catheterization tomorrow.  The risks and benefits including myocardial infarction, CVA and death discussed and he agrees to proceed.  He understands that this will likely lead to end-stage renal disease and need for dialysis prior to renal transplant.    3 paroxysmal atrial fibrillation-patient remains in atrial fibrillation this morning.  Heart rate decreased.  Metoprolol has been discontinued.  Follow heart rate.  Hold apixaban for cardiac catheterization tomorrow.  Resume when procedures complete.  4 coronary artery disease-continue statin.  Add aspirin.  5 hypertension-blood pressure is reasonably well controlled.   I am concerned that blood pressure will decrease following initiation of dialysis.  Decrease hydralazine to 50 mg p.o. 3 times daily and follow.  6 hyperlipidemia-continue statin.   For questions or updates, please contact [place] Please consult [email or web address] for contact info under        Signed, [name], MD  12/17/2018, 9:03 AM

## 2018-12-17 NOTE — Progress Notes (Signed)
PROGRESS NOTE    Louis [name]  WUJ:[id number] DOB: 24-May-1946 DOA: 12/14/2018 PCP: [name], MD   Brief Narrative: Louis [name] is a 73 y.o. male with medical history significant of kidney disease stage IV, coronary artery disease with plan for medical management, diabetes mellitus with Charcot foot, congestive heart failure diastolic, hypertension, obstructive sleep apnea on CPAP, diabetic peripheral neuropathy. Patient presented secondary to shortness of breath and started on lasix drip for acute heart failure.   Assessment & Plan:   Principal Problem:   Anasarca Active Problems:   Diabetes mellitus with stage 4 chronic kidney disease (HCC)   Chronic kidney disease, stage 4, severely decreased GFR (HCC)   Diabetic polyneuropathy associated with type 2 diabetes mellitus (HCC)   Heart failure, diastolic (HCC)   Diabetic peripheral neuropathy (HCC)   Acute diastolic CHF (congestive heart failure) ([place])   Anasarca Secondary to heart failure exacerbation. -Cardiology management  Acute on chronic diastolic heart failure EF of 65-70% -Cardiology management  Diabetes mellitus Patient is on Novlin N/R as an outpatient -Continue NPH 35 units and SSI  AKI on CKD stage IV Baseline creatinine of 2.4-2.6. Creatinine up to 3.98 with a BUN of 124. Improved with hemodialysis -Nephrology recommendations: CLIP, no permanent HD access planned at this time  Diabetic peripheral neuropathy On gabapentin as an outpatient. -Renally dose gabapentin -Tylenol prn  Hyperlipidemia -Continue Lipitor  Hypertension Uncontrolled. Metoprolol discontinued. -Continue isosorbide mononitrate, hydralazine, amlodipine  Atrial fibrillation Rate control. Metoprolol discontinued by cardiology secondary to bradycardia -Continue Eliquis  Bradycardia In setting of metoprolol. HR down to 30s. Metoprolol discontinued.   DVT prophylaxis: Eliquis Code Status:   Code Status: Full  Code Family Communication: Wife at bedside Disposition Plan: Discharge pending cardiology/nephrology recommendations; CLIP   Consultants:   Cardiology  Nephrology  Procedures:   None  Antimicrobials:  None    Subjective: No issues overnight. Pain of right fingers.  Objective: Vitals:   12/16/18 2137 12/17/18 0651 12/17/18 0856 12/17/18 1210  BP: 135/69 (!) 160/82 (!) 175/89 128/62  Pulse: 65 92 75 (!) 59  Resp: 18 18  18   Temp: (!) 97.4 F (36.3 C) (!) 97.5 F (36.4 C)  97.7 F (36.5 C)  TempSrc: Oral Oral  Oral  SpO2: 97% 99%  92%  Weight:  109.4 kg    Height:        Intake/Output Summary (Last 24 hours) at 12/17/2018 1318 Last data filed at 12/17/2018 0842 Gross per 24 hour  Intake 691.07 ml  Output 3700 ml  Net -3008.93 ml   Filed Weights   12/16/18 1800 12/16/18 2044 12/17/18 0651  Weight: 110.8 kg 109.3 kg 109.4 kg    Examination:  General exam: Appears calm and comfortable Respiratory system: Clear to auscultation. Respiratory effort normal. Cardiovascular system: S1 & S2 heard, RRR. No murmurs, rubs, gallops or clicks. Gastrointestinal system: Abdomen is nondistended, soft and nontender. No organomegaly or masses felt. Normal bowel sounds heard. Central nervous system: Alert and oriented. No focal neurological deficits. Extremities: LE edema. No calf tenderness Skin: No cyanosis. No rashes Psychiatry: Judgement and insight appear normal. Mood & affect appropriate.    Data Reviewed: I have personally reviewed following labs and imaging studies  CBC: Recent Labs  Lab 12/16/18 0939 12/17/18 0413  WBC 8.8 7.1  HGB 12.7* 12.2*  HCT 38.8* 37.7*  MCV 90.7 91.3  PLT 288 956   Basic Metabolic Panel: Recent Labs  Lab 12/14/18 1848 12/15/18 0445 12/16/18 0424 12/17/18 0413  NA 136 135 133* 136  K 3.4* 3.2* 2.8* 3.2*  CL 92* 93* 88* 93*  CO2 27 27 26 28   GLUCOSE 115* 183* 101* 196*  BUN 112* 115* 124* 86*  CREATININE 3.80* 3.95* 3.98*  3.07*  CALCIUM 9.4 9.1 9.1 9.0  MG  --   --  2.4 2.2   GFR: Estimated Creatinine Clearance: 26.1 mL/min (A) (by C-G formula based on SCr of 3.07 mg/dL (H)). Liver Function Tests: No results for input(s): AST, ALT, ALKPHOS, BILITOT, PROT, ALBUMIN in the last 168 hours. No results for input(s): LIPASE, AMYLASE in the last 168 hours. No results for input(s): AMMONIA in the last 168 hours. Coagulation Profile: Recent Labs  Lab 12/16/18 0939 12/17/18 0413  INR 1.39 1.29   Cardiac Enzymes: No results for input(s): CKTOTAL, CKMB, CKMBINDEX, TROPONINI in the last 168 hours. BNP (last 3 results) No results for input(s): PROBNP in the last 8760 hours. HbA1C: No results for input(s): HGBA1C in the last 72 hours. CBG: Recent Labs  Lab 12/16/18 1150 12/16/18 1621 12/16/18 2134 12/17/18 0741 12/17/18 1137  GLUCAP 137* 103* 149* 162* 151*   Lipid Profile: No results for input(s): CHOL, HDL, LDLCALC, TRIG, CHOLHDL, LDLDIRECT in the last 72 hours. Thyroid Function Tests: No results for input(s): TSH, T4TOTAL, FREET4, T3FREE, THYROIDAB in the last 72 hours. Anemia Panel: No results for input(s): VITAMINB12, FOLATE, FERRITIN, TIBC, IRON, RETICCTPCT in the last 72 hours. Sepsis Labs: No results for input(s): PROCALCITON, LATICACIDVEN in the last 168 hours.  No results found for this or any previous visit (from the past 240 hour(s)).       Radiology Studies: Ir Fluoro Guide Cv Line Right  Result Date: 12/16/2018 CLINICAL DATA:  Renal failure, needs durable venous access for hemodialysis EXAM: TUNNELED HEMODIALYSIS CATHETER PLACEMENT WITH ULTRASOUND AND FLUOROSCOPIC GUIDANCE TECHNIQUE: The procedure, risks, benefits, and alternatives were explained to the patient. Questions regarding the procedure were encouraged and answered. The patient understands and consents to the procedure. As antibiotic prophylaxis, cefazolin 2 g was ordered pre-procedure and administered intravenously within one  hour of incision. Patency of the right IJ vein was confirmed with ultrasound with image documentation. An appropriate skin site was determined. Region was prepped using maximum barrier technique including cap and mask, sterile gown, sterile gloves, large sterile sheet, and Chlorhexidine as cutaneous antisepsis. The region was infiltrated locally with 1% lidocaine. Intravenous Fentanyl and Versed were administered as conscious sedation during continuous monitoring of the patient's level of consciousness and physiological / cardiorespiratory status by the radiology RN, with a total moderate sedation time of 11 minutes. Under real-time ultrasound guidance, the right IJ vein was accessed with a 21 gauge micropuncture needle; the needle tip within the vein was confirmed with ultrasound image documentation. Needle exchanged over the 018 guidewire for transitional dilator, which allowed advancement of a Benson wire into the IVC. Over this, an MPA catheter was advanced. A Palindrome 19 hemodialysis catheter was tunneled from the right anterior chest wall approach to the right IJ dermatotomy site. The MPA catheter was exchanged over an Amplatz wire for serial vascular dilators which allow placement of a peel-away sheath, through which the catheter was advanced under intermittent fluoroscopy, positioned with its tips in the proximal and midright atrium. Spot chest radiograph confirms good catheter position. No pneumothorax. Catheter was flushed and primed per protocol. Catheter secured externally with O Prolene sutures. The right IJ dermatotomy site was closed with Dermabond. COMPLICATIONS: COMPLICATIONS None immediate FLUOROSCOPY TIME:  0.2 minutes;  99 uGym2 DAP COMPARISON:  None IMPRESSION: 1. Technically successful placement of tunneled right IJ hemodialysis catheter with ultrasound and fluoroscopic guidance. Ready for routine use. ACCESS: Remains approachable for percutaneous intervention as needed. Electronically Signed    By: Lucrezia Europe M.D.   On: 12/16/2018 16:18   Ir US Guide Vasc Access Right  Result Date: 12/16/2018 CLINICAL DATA:  Renal failure, needs durable venous access for hemodialysis EXAM: TUNNELED HEMODIALYSIS CATHETER PLACEMENT WITH ULTRASOUND AND FLUOROSCOPIC GUIDANCE TECHNIQUE: The procedure, risks, benefits, and alternatives were explained to the patient. Questions regarding the procedure were encouraged and answered. The patient understands and consents to the procedure. As antibiotic prophylaxis, cefazolin 2 g was ordered pre-procedure and administered intravenously within one hour of incision. Patency of the right IJ vein was confirmed with ultrasound with image documentation. An appropriate skin site was determined. Region was prepped using maximum barrier technique including cap and mask, sterile gown, sterile gloves, large sterile sheet, and Chlorhexidine as cutaneous antisepsis. The region was infiltrated locally with 1% lidocaine. Intravenous Fentanyl and Versed were administered as conscious sedation during continuous monitoring of the patient's level of consciousness and physiological / cardiorespiratory status by the radiology RN, with a total moderate sedation time of 11 minutes. Under real-time ultrasound guidance, the right IJ vein was accessed with a 21 gauge micropuncture needle; the needle tip within the vein was confirmed with ultrasound image documentation. Needle exchanged over the 018 guidewire for transitional dilator, which allowed advancement of a Benson wire into the IVC. Over this, an MPA catheter was advanced. A Palindrome 19 hemodialysis catheter was tunneled from the right anterior chest wall approach to the right IJ dermatotomy site. The MPA catheter was exchanged over an Amplatz wire for serial vascular dilators which allow placement of a peel-away sheath, through which the catheter was advanced under intermittent fluoroscopy, positioned with its tips in the proximal and midright  atrium. Spot chest radiograph confirms good catheter position. No pneumothorax. Catheter was flushed and primed per protocol. Catheter secured externally with O Prolene sutures. The right IJ dermatotomy site was closed with Dermabond. COMPLICATIONS: COMPLICATIONS None immediate FLUOROSCOPY TIME:  0.2 minutes; 99 uGym2 DAP COMPARISON:  None IMPRESSION: 1. Technically successful placement of tunneled right IJ hemodialysis catheter with ultrasound and fluoroscopic guidance. Ready for routine use. ACCESS: Remains approachable for percutaneous intervention as needed. Electronically Signed   By: Lucrezia Europe M.D.   On: 12/16/2018 16:18        Scheduled Meds: . amLODipine  10 mg Oral Daily  . aspirin  81 mg Oral Daily  . atorvastatin  80 mg Oral Daily  . Chlorhexidine Gluconate Cloth  6 each Topical Q0600  . [START ON 12/18/2018] Chlorhexidine Gluconate Cloth  6 each Topical Q0600  . cholecalciferol  5,000 Units Oral Daily  . gabapentin  300 mg Oral Daily  . hydrALAZINE  50 mg Oral TID  . insulin aspart  0-15 Units Subcutaneous TID WC  . insulin aspart  0-5 Units Subcutaneous QHS  . insulin aspart  4 Units Subcutaneous TID WC  . insulin NPH Human  35 Units Subcutaneous BID AC  . isosorbide mononitrate  20 mg Oral BID  . polyethylene glycol  17 g Oral Daily  . potassium chloride  20 mEq Oral Once  . saccharomyces boulardii  250 mg Oral Daily  . sodium chloride flush  3 mL Intravenous Q12H   Continuous Infusions: . sodium chloride       LOS: 3 days  Cordelia Poche, MD [facility] 12/17/2018, 1:18 PM  If 7PM-7AM, please contact night-coverage [email or web address]

## 2018-12-17 NOTE — Progress Notes (Signed)
Responded to PIV consult. Pt in restroom and requests return at a later time.

## 2018-12-18 ENCOUNTER — Encounter (HOSPITAL_COMMUNITY)
Admission: AD | Disposition: A | Discharge status: Home / Self Care | Payer: Self-pay | Source: Other Acute Inpatient Hospital | Attending: Family Medicine

## 2018-12-18 DIAGNOSIS — N185 Chronic kidney disease, stage 5: Secondary | ICD-10-CM

## 2018-12-18 DIAGNOSIS — I25119 Atherosclerotic heart disease of native coronary artery with unspecified angina pectoris: Secondary | ICD-10-CM

## 2018-12-18 DIAGNOSIS — N179 Acute kidney failure, unspecified: Secondary | ICD-10-CM

## 2018-12-18 DIAGNOSIS — I5033 Acute on chronic diastolic (congestive) heart failure: Secondary | ICD-10-CM

## 2018-12-18 DIAGNOSIS — I480 Paroxysmal atrial fibrillation: Secondary | ICD-10-CM

## 2018-12-18 HISTORY — PX: INTRAVASCULAR PRESSURE WIRE/FFR STUDY: CATH118243

## 2018-12-18 HISTORY — PX: RIGHT/LEFT HEART CATH AND CORONARY ANGIOGRAPHY: CATH118266

## 2018-12-18 LAB — POCT I-STAT EG7
Acid-Base Excess: 7 mmol/L — ABNORMAL HIGH (ref 0.0–2.0)
BICARBONATE: 32.8 mmol/L — AB (ref 20.0–28.0)
Calcium, Ion: 1.21 mmol/L (ref 1.15–1.40)
HCT: 37 % — ABNORMAL LOW (ref 39.0–52.0)
Hemoglobin: 12.6 g/dL — ABNORMAL LOW (ref 13.0–17.0)
O2 Saturation: 76 %
Potassium: 3.4 mmol/L — ABNORMAL LOW (ref 3.5–5.1)
Sodium: 140 mmol/L (ref 135–145)
TCO2: 34 mmol/L — AB (ref 22–32)
pCO2, Ven: 48 mmHg (ref 44.0–60.0)
pH, Ven: 7.442 — ABNORMAL HIGH (ref 7.250–7.430)
pO2, Ven: 40 mmHg (ref 32.0–45.0)

## 2018-12-18 LAB — GLUCOSE, CAPILLARY
GLUCOSE-CAPILLARY: 111 mg/dL — AB (ref 70–99)
GLUCOSE-CAPILLARY: 156 mg/dL — AB (ref 70–99)
Glucose-Capillary: 108 mg/dL — ABNORMAL HIGH (ref 70–99)
Glucose-Capillary: 91 mg/dL (ref 70–99)

## 2018-12-18 LAB — BASIC METABOLIC PANEL
Anion gap: 13 (ref 5–15)
BUN: 92 mg/dL — ABNORMAL HIGH (ref 8–23)
CO2: 28 mmol/L (ref 22–32)
Calcium: 9.7 mg/dL (ref 8.9–10.3)
Chloride: 98 mmol/L (ref 98–111)
Creatinine, Ser: 3.01 mg/dL — ABNORMAL HIGH (ref 0.61–1.24)
GFR calc Af Amer: 23 mL/min — ABNORMAL LOW (ref >60)
GFR calc non Af Amer: 20 mL/min — ABNORMAL LOW (ref >60)
Glucose, Bld: 108 mg/dL — ABNORMAL HIGH (ref 70–99)
POTASSIUM: 3.4 mmol/L — AB (ref 3.5–5.1)
Sodium: 139 mmol/L (ref 135–145)

## 2018-12-18 LAB — POCT ACTIVATED CLOTTING TIME: Activated Clotting Time: 274 seconds

## 2018-12-18 SURGERY — RIGHT/LEFT HEART CATH AND CORONARY ANGIOGRAPHY
Anesthesia: LOCAL

## 2018-12-18 MED ORDER — HEPARIN SODIUM (PORCINE) 1000 UNIT/ML IJ SOLN
INTRAMUSCULAR | Status: AC
  Filled 2018-12-18: qty 4

## 2018-12-18 MED ORDER — FENTANYL CITRATE (PF) 100 MCG/2ML IJ SOLN
INTRAMUSCULAR | Status: AC
  Filled 2018-12-18: qty 2

## 2018-12-18 MED ORDER — HEPARIN (PORCINE) IN NACL 1000-0.9 UT/500ML-% IV SOLN
INTRAVENOUS | Status: DC | PRN
  Administered 2018-12-18 (×2): 500 mL

## 2018-12-18 MED ORDER — HEPARIN SODIUM (PORCINE) 1000 UNIT/ML IJ SOLN
3200.0000 [IU] | Freq: Once | INTRAMUSCULAR | Status: AC
  Administered 2018-12-18: 3200 [IU]
  Filled 2018-12-18: qty 3.2

## 2018-12-18 MED ORDER — LIDOCAINE HCL (PF) 1 % IJ SOLN
INTRAMUSCULAR | Status: DC | PRN
  Administered 2018-12-18: 15 mL

## 2018-12-18 MED ORDER — WARFARIN VIDEO
Freq: Once | Status: AC
  Administered 2018-12-18: 18:00:00

## 2018-12-18 MED ORDER — LIDOCAINE HCL (PF) 1 % IJ SOLN
INTRAMUSCULAR | Status: AC
  Filled 2018-12-18: qty 30

## 2018-12-18 MED ORDER — HEPARIN (PORCINE) 25000 UT/250ML-% IV SOLN
1450.0000 [IU]/h | INTRAVENOUS | Status: DC
  Administered 2018-12-18: 1450 [IU]/h via INTRAVENOUS
  Filled 2018-12-18: qty 250

## 2018-12-18 MED ORDER — HEPARIN (PORCINE) IN NACL 1000-0.9 UT/500ML-% IV SOLN
INTRAVENOUS | Status: DC | PRN
  Administered 2018-12-18: 500 mL

## 2018-12-18 MED ORDER — HEPARIN SODIUM (PORCINE) 1000 UNIT/ML DIALYSIS
3200.0000 [IU] | INTRAMUSCULAR | Status: DC | PRN
  Filled 2018-12-18: qty 4

## 2018-12-18 MED ORDER — HEPARIN (PORCINE) IN NACL 1000-0.9 UT/500ML-% IV SOLN
INTRAVENOUS | Status: AC
  Filled 2018-12-18: qty 1000

## 2018-12-18 MED ORDER — SODIUM CHLORIDE 0.9% FLUSH
3.0000 mL | Freq: Two times a day (BID) | INTRAVENOUS | Status: DC
  Administered 2018-12-18 – 2018-12-19 (×4): 3 mL via INTRAVENOUS

## 2018-12-18 MED ORDER — SODIUM CHLORIDE 0.9% FLUSH: 3.0000 mL | INTRAVENOUS | Status: DC | PRN

## 2018-12-18 MED ORDER — WARFARIN SODIUM 5 MG PO TABS
5.0000 mg | ORAL_TABLET | Freq: Once | ORAL | Status: AC
  Administered 2018-12-18: 5 mg via ORAL
  Filled 2018-12-18: qty 1

## 2018-12-18 MED ORDER — SODIUM CHLORIDE 0.9 % IV SOLN: 250.0000 mL | INTRAVENOUS | Status: DC | PRN

## 2018-12-18 MED ORDER — MIDAZOLAM HCL 2 MG/2ML IJ SOLN
INTRAMUSCULAR | Status: DC | PRN
  Administered 2018-12-18: 1 mg via INTRAVENOUS

## 2018-12-18 MED ORDER — MIDAZOLAM HCL 2 MG/2ML IJ SOLN
INTRAMUSCULAR | Status: AC
  Filled 2018-12-18: qty 2

## 2018-12-18 MED ORDER — HEPARIN (PORCINE) IN NACL 1000-0.9 UT/500ML-% IV SOLN
INTRAVENOUS | Status: AC
  Filled 2018-12-18: qty 500

## 2018-12-18 MED ORDER — FENTANYL CITRATE (PF) 100 MCG/2ML IJ SOLN
INTRAMUSCULAR | Status: DC | PRN
  Administered 2018-12-18: 50 ug via INTRAVENOUS

## 2018-12-18 MED ORDER — IOHEXOL 350 MG/ML SOLN
INTRAVENOUS | Status: DC | PRN
  Administered 2018-12-18: 140 mL via INTRA_ARTERIAL

## 2018-12-18 MED ORDER — PATIENT'S GUIDE TO USING COUMADIN BOOK
Freq: Once | Status: AC
  Administered 2018-12-18: 18:00:00
  Filled 2018-12-18: qty 1

## 2018-12-18 MED ORDER — POTASSIUM CHLORIDE CRYS ER 20 MEQ PO TBCR
40.0000 meq | EXTENDED_RELEASE_TABLET | Freq: Once | ORAL | Status: AC
  Administered 2018-12-18: 40 meq via ORAL
  Filled 2018-12-18: qty 2

## 2018-12-18 MED ORDER — HEPARIN SODIUM (PORCINE) 1000 UNIT/ML IJ SOLN
INTRAMUSCULAR | Status: DC | PRN
  Administered 2018-12-18: 8000 [IU] via INTRAVENOUS

## 2018-12-18 MED ORDER — CHLORHEXIDINE GLUCONATE CLOTH 2 % EX PADS
6.0000 | MEDICATED_PAD | Freq: Every day | CUTANEOUS | Status: DC
  Administered 2018-12-19 – 2018-12-20 (×2): 6 via TOPICAL

## 2018-12-18 MED ORDER — WARFARIN - PHARMACIST DOSING INPATIENT
Freq: Every day | Status: DC
  Administered 2018-12-19: 19:00:00

## 2018-12-18 MED ORDER — HEPARIN SODIUM (PORCINE) 1000 UNIT/ML IJ SOLN
INTRAMUSCULAR | Status: AC
  Filled 2018-12-18: qty 1

## 2018-12-18 SURGICAL SUPPLY — 17 items
CATH INFINITI 5 FR 3DRC (CATHETERS) ×1 IMPLANT
CATH INFINITI 5 FR AR2 MOD (CATHETERS) ×1 IMPLANT
CATH INFINITI 5FR MULTPACK ANG (CATHETERS) ×1 IMPLANT
CATH LAUNCHER 5F JL5 (CATHETERS) IMPLANT
CATH SWAN GANZ 7F STRAIGHT (CATHETERS) ×1 IMPLANT
CATHETER LAUNCHER 5F JL5 (CATHETERS) ×2
CLOSURE MYNX CONTROL 5F (Vascular Products) ×1 IMPLANT
GUIDEWIRE PRESSURE COMET II (WIRE) ×1 IMPLANT
KIT ESSENTIALS PG (KITS) ×1 IMPLANT
KIT HEART LEFT (KITS) ×2 IMPLANT
PACK CARDIAC CATHETERIZATION (CUSTOM PROCEDURE TRAY) ×2 IMPLANT
SHEATH PINNACLE 5F 10CM (SHEATH) ×1 IMPLANT
SHEATH PINNACLE 7F 10CM (SHEATH) ×1 IMPLANT
SHEATH PROBE COVER 6X72 (BAG) ×1 IMPLANT
TRANSDUCER W/STOPCOCK (MISCELLANEOUS) ×2 IMPLANT
TUBING CIL FLEX 10 FLL-RA (TUBING) ×2 IMPLANT
WIRE EMERALD 3MM-J .035X150CM (WIRE) ×1 IMPLANT

## 2018-12-18 NOTE — Interval H&P Note (Signed)
History and Physical Interval Note:  12/18/2018 12:09 PM  Louis [name]  has presented today for surgery, with the diagnosis of hf  The various methods of treatment have been discussed with the patient and family. After consideration of risks, benefits and other options for treatment, the patient has consented to  Procedure(s): RIGHT/LEFT HEART CATH AND CORONARY ANGIOGRAPHY (N/A) as a surgical intervention .  The patient's history has been reviewed, patient examined, no change in status, stable for surgery.  I have reviewed the patient's chart and labs.  Questions were answered to the patient's satisfaction.     Kathlyn Sacramento

## 2018-12-18 NOTE — Progress Notes (Signed)
RT Note:  Patient wearing home CPAP this shift.

## 2018-12-18 NOTE — Progress Notes (Signed)
Pt gone to dialysis.  He has his home CPAP in the room.  [name] stated he doesn't need assistance with his CPAP. RT instructed pt's wife to have RN call RT if any issues come up.

## 2018-12-18 NOTE — Progress Notes (Signed)
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  Found to be in acute on chronic CHF and cardiorenal syndrome.  Cardiology and nephrology consulted.  Started on dialysis here.  Plan for cardiac catheterization today.  Assessment & Plan:   Principal Problem:   Anasarca Active Problems:   Diabetes mellitus with stage 4 chronic kidney disease (HCC)   Chronic kidney disease, stage 4, severely decreased GFR (HCC)   Diabetic polyneuropathy associated with type 2 diabetes mellitus (HCC)   Heart failure, diastolic (HCC)   Diabetic peripheral neuropathy (HCC)   Acute diastolic CHF (congestive heart failure) (HCC)  Anasarca: Secondary to heart failure exacerbation.  Currently stable.  Continue current management.  Not on diuretics since he has been started on dialysis.  Acute on chronic diastolic heart failure: EF of  65 to 70%.  Cardiology following.  Continue current management. He will undergo cardiac catheterization by cardiology today as a preparation for renal  transplant at [facility].  AKI on CKD stage IV: Likely cardiorenal syndrome.  Baseline creatinine is 2.4-2.6.  Creatinine up to 3.98 at some point.  Nephrology was consulted and started on dialysis.  He needs to be set up with outpatient dialysis facility.  Nephrology following.  Has a temporary dialysis catheter on his right chest.  He will follow-up with [facility] for kidney transplant.  He has a donor.  Diabetic peripheral neuropathy: Continue gabapentin.  Hyperlipidemia: Continue Lipitor  Hypertension:  Currently stable.  Continue Imdur, hydralazine, amlodipine.  Chronic A. fib: Currently rate is controlled.  Metoprolol discontinued due to bradycardia.  Was on Eliquis which is on hold for  cardiac catheterization.  Cardiology contemplating on starting on warfarin because of his CKD.  Bradycardia: Resolved  Diabetes type 2: On insulin.Hemoglobin A1c of 6.5 as per 11/27/2017  Hypokalemia: Supplemented with potassium  Sleep apnea: Continue CPAP         DVT prophylaxis:Eliquis(on hold) Code Status: Full Family Communication: Wife present at the bedside Disposition Plan: Home after nephrology, cardiology clearance  Consultants: Nephrology, cardiology  Procedures: Dialysis catheter placement ,hemodialysis  Antimicrobials:  Anti-infectives (From admission, onward)   Start     Dose/Rate Route Frequency Ordered Stop   12/16/18 1446  ceFAZolin (ANCEF) 2-4 GM/100ML-% IVPB    Note to Pharmacy:  Luis Abed   : cabinet override      12/16/18 1446 12/17/18 0259   12/16/18 1045  ceFAZolin (ANCEF) IVPB 2g/100 mL premix     2 g 200 mL/hr over 30 Minutes Intravenous To Radiology 12/16/18 1037 12/16/18 1517      Subjective: Patient seen and examined bedside this morning.  Remains comfortable.  Denies any chest pain shortness of breath.  Awaiting cardiac cath today.  Objective: Vitals:   12/17/18 1210 12/17/18 1930 12/17/18 2143 12/18/18 0530  BP: 128/62 133/62 137/75 (!) 164/79  Pulse: (!) 59 (!) 55  62  Resp: 18 18  18   Temp: 97.7 F (36.5 C) (!) 97.4 F (36.3 C)  97.6 F (36.4 C)  TempSrc: Oral Oral  Oral  SpO2: 92% 96%  100%  Weight:    109 kg  Height:  Intake/Output Summary (Last 24 hours) at 12/18/2018 0857 Last data filed at 12/18/2018 1497 Gross per 24 hour  Intake 9.83 ml  Output 1050 ml  Net -1040.17 ml   Filed Weights   12/16/18 2044 12/17/18 0651 12/18/18 0530  Weight: 109.3 kg 109.4 kg 109 kg    Examination:  General exam: Appears calm and  comfortable ,Not in distress,obese HEENT:PERRL,Oral mucosa moist, Ear/Nose normal on gross exam Respiratory system: Bilateral decreased air entry on the bases cardiovascular system: S1 & S2 heard, RRR. No JVD, murmurs, rubs, gallops or clicks. Trace  pedal edema.  Temporary dialysis catheter on the right chest Gastrointestinal system: Abdomen is nondistended, soft and nontender. No organomegaly or masses felt. Normal bowel sounds heard. Central nervous system: Alert and oriented. No focal neurological deficits. Extremities: Trace pedal edema on bilateral lower extremities, no clubbing ,no cyanosis, distal peripheral pulses palpable. Skin: No rashes, lesions or ulcers,no icterus ,no pallor MSK: Normal muscle bulk,tone ,power Psychiatry: Judgement and insight appear normal. Mood & affect appropriate.     Data Reviewed: I have personally reviewed following labs and imaging studies  CBC: Recent Labs  Lab 12/16/18 0939 12/17/18 0413 12/17/18 2215  WBC 8.8 7.1 8.5  HGB 12.7* 12.2* 12.2*  HCT 38.8* 37.7* 37.0*  MCV 90.7 91.3 90.9  PLT 288 236 026   Basic Metabolic Panel: Recent Labs  Lab 12/15/18 0445 12/16/18 0424 12/17/18 0413 12/17/18 2215 12/18/18 0500  NA 135 133* 136 136 139  K 3.2* 2.8* 3.2* 3.8 3.4*  CL 93* 88* 93* 96* 98  CO2 27 26 28 28 28   GLUCOSE 183* 101* 196* 172* 108*  BUN 115* 124* 86* 96* 92*  CREATININE 3.95* 3.98* 3.07* 3.26* 3.01*  CALCIUM 9.1 9.1 9.0 9.2 9.7  MG  --  2.4 2.2  --   --   PHOS  --   --   --  3.8  --    GFR: Estimated Creatinine Clearance: 26.6 mL/min (A) (by C-G formula based on SCr of 3.01 mg/dL (H)). Liver Function Tests: Recent Labs  Lab 12/17/18 2215  ALBUMIN 3.4*   No results for input(s): LIPASE, AMYLASE in the last 168 hours. No results for input(s): AMMONIA in the last 168 hours. Coagulation Profile: Recent Labs  Lab 12/16/18 0939 12/17/18 0413  INR 1.39 1.29   Cardiac Enzymes: No results for input(s): CKTOTAL, CKMB,  CKMBINDEX, TROPONINI in the last 168 hours. BNP (last 3 results) No results for input(s): PROBNP in the last 8760 hours. HbA1C: No results for input(s): HGBA1C in the last 72 hours. CBG: Recent Labs  Lab 12/17/18 0741 12/17/18 1137 12/17/18 1627 12/17/18 2140 12/18/18 0757  GLUCAP 162* 151* 161* 175* 111*   Lipid Profile: No results for input(s): CHOL, HDL, LDLCALC, TRIG, CHOLHDL, LDLDIRECT in the last 72 hours. Thyroid Function Tests: No results for input(s): TSH, T4TOTAL, FREET4, T3FREE, THYROIDAB in the last 72 hours. Anemia Panel: No results for input(s): VITAMINB12, FOLATE, FERRITIN, TIBC, IRON, RETICCTPCT in the last 72 hours. Sepsis Labs: No results for input(s): PROCALCITON, LATICACIDVEN in the last 168 hours.  No results found for this or any previous visit (from the past 240 hour(s)).       Radiology Studies: Ir Fluoro Guide Cv Line Right  Result Date: 12/16/2018 CLINICAL DATA:  Renal failure, needs durable venous access for hemodialysis EXAM: TUNNELED HEMODIALYSIS CATHETER PLACEMENT WITH ULTRASOUND AND FLUOROSCOPIC GUIDANCE TECHNIQUE: The procedure, risks, benefits, and alternatives were explained to the patient. Questions regarding the procedure were  encouraged and answered. The patient understands and consents to the procedure. As antibiotic prophylaxis, cefazolin 2 g was ordered pre-procedure and administered intravenously within one hour of incision. Patency of the right IJ vein was confirmed with ultrasound with image documentation. An appropriate skin site was determined. Region was prepped using maximum barrier technique including cap and mask, sterile gown, sterile gloves, large sterile sheet, and Chlorhexidine as cutaneous antisepsis. The region was infiltrated locally with 1% lidocaine. Intravenous Fentanyl and Versed were administered as conscious sedation during continuous monitoring of the patient's level of consciousness and physiological / cardiorespiratory  status by the radiology RN, with a total moderate sedation time of 11 minutes. Under real-time ultrasound guidance, the right IJ vein was accessed with a 21 gauge micropuncture needle; the needle tip within the vein was confirmed with ultrasound image documentation. Needle exchanged over the 018 guidewire for transitional dilator, which allowed advancement of a Benson wire into the IVC. Over this, an MPA catheter was advanced. A Palindrome 19 hemodialysis catheter was tunneled from the right anterior chest wall approach to the right IJ dermatotomy site. The MPA catheter was exchanged over an Amplatz wire for serial vascular dilators which allow placement of a peel-away sheath, through which the catheter was advanced under intermittent fluoroscopy, positioned with its tips in the proximal and midright atrium. Spot chest radiograph confirms good catheter position. No pneumothorax. Catheter was flushed and primed per protocol. Catheter secured externally with O Prolene sutures. The right IJ dermatotomy site was closed with Dermabond. COMPLICATIONS: COMPLICATIONS None immediate FLUOROSCOPY TIME:  0.2 minutes; 99 uGym2 DAP COMPARISON:  None IMPRESSION: 1. Technically successful placement of tunneled right IJ hemodialysis catheter with ultrasound and fluoroscopic guidance. Ready for routine use. ACCESS: Remains approachable for percutaneous intervention as needed. Electronically Signed   By: Lucrezia Europe M.D.   On: 12/16/2018 16:18   Ir US Guide Vasc Access Right  Result Date: 12/16/2018 CLINICAL DATA:  Renal failure, needs durable venous access for hemodialysis EXAM: TUNNELED HEMODIALYSIS CATHETER PLACEMENT WITH ULTRASOUND AND FLUOROSCOPIC GUIDANCE TECHNIQUE: The procedure, risks, benefits, and alternatives were explained to the patient. Questions regarding the procedure were encouraged and answered. The patient understands and consents to the procedure. As antibiotic prophylaxis, cefazolin 2 g was ordered  pre-procedure and administered intravenously within one hour of incision. Patency of the right IJ vein was confirmed with ultrasound with image documentation. An appropriate skin site was determined. Region was prepped using maximum barrier technique including cap and mask, sterile gown, sterile gloves, large sterile sheet, and Chlorhexidine as cutaneous antisepsis. The region was infiltrated locally with 1% lidocaine. Intravenous Fentanyl and Versed were administered as conscious sedation during continuous monitoring of the patient's level of consciousness and physiological / cardiorespiratory status by the radiology RN, with a total moderate sedation time of 11 minutes. Under real-time ultrasound guidance, the right IJ vein was accessed with a 21 gauge micropuncture needle; the needle tip within the vein was confirmed with ultrasound image documentation. Needle exchanged over the 018 guidewire for transitional dilator, which allowed advancement of a Benson wire into the IVC. Over this, an MPA catheter was advanced. A Palindrome 19 hemodialysis catheter was tunneled from the right anterior chest wall approach to the right IJ dermatotomy site. The MPA catheter was exchanged over an Amplatz wire for serial vascular dilators which allow placement of a peel-away sheath, through which the catheter was advanced under intermittent fluoroscopy, positioned with its tips in the proximal and midright atrium. Spot chest radiograph confirms good  catheter position. No pneumothorax. Catheter was flushed and primed per protocol. Catheter secured externally with O Prolene sutures. The right IJ dermatotomy site was closed with Dermabond. COMPLICATIONS: COMPLICATIONS None immediate FLUOROSCOPY TIME:  0.2 minutes; 99 uGym2 DAP COMPARISON:  None IMPRESSION: 1. Technically successful placement of tunneled right IJ hemodialysis catheter with ultrasound and fluoroscopic guidance. Ready for routine use. ACCESS: Remains approachable for  percutaneous intervention as needed. Electronically Signed   By: Lucrezia Europe M.D.   On: 12/16/2018 16:18        Scheduled Meds: . amLODipine  10 mg Oral Daily  . [START ON 12/19/2018] aspirin  81 mg Oral Daily  . atorvastatin  80 mg Oral Daily  . Chlorhexidine Gluconate Cloth  6 each Topical Q0600  . Chlorhexidine Gluconate Cloth  6 each Topical Q0600  . cholecalciferol  5,000 Units Oral Daily  . gabapentin  300 mg Oral Daily  . hydrALAZINE  50 mg Oral TID  . insulin aspart  0-15 Units Subcutaneous TID WC  . insulin aspart  0-5 Units Subcutaneous QHS  . insulin aspart  4 Units Subcutaneous TID WC  . insulin NPH Human  35 Units Subcutaneous BID AC  . isosorbide mononitrate  20 mg Oral BID  . polyethylene glycol  17 g Oral Daily  . potassium chloride  40 mEq Oral Once  . saccharomyces boulardii  250 mg Oral Daily  . sodium chloride flush  3 mL Intravenous Q12H  . sodium chloride flush  3 mL Intravenous Q12H   Continuous Infusions: . sodium chloride    . sodium chloride    . sodium chloride 10 mL/hr at 12/18/18 0523  . sodium chloride    . sodium chloride       LOS: 4 days    Time spent:35 mins. More than 50% of that time was spent in counseling and/or coordination of care.      Shelly Coss, MD [facility] Pager [phone]  If 7PM-7AM, please contact night-coverage [email or web address] Password [facility] 12/18/2018, 8:58 AM

## 2018-12-18 NOTE — Progress Notes (Signed)
Patient had 10 beats of v-tach, pt is in bed sleeping and is asymptomatic. MD loring with heartcare notified. Will continue to monitor.

## 2018-12-18 NOTE — Progress Notes (Signed)
Progress Note  Patient Name: Louis [name] Date of Encounter: 12/18/2018  Primary Cardiologist: Dr Stanford Breed  Subjective   Denies CP or dyspnea  Inpatient Medications    Scheduled Meds: . amLODipine  10 mg Oral Daily  . [START ON 12/19/2018] aspirin  81 mg Oral Daily  . atorvastatin  80 mg Oral Daily  . Chlorhexidine Gluconate Cloth  6 each Topical Q0600  . Chlorhexidine Gluconate Cloth  6 each Topical Q0600  . cholecalciferol  5,000 Units Oral Daily  . gabapentin  300 mg Oral Daily  . hydrALAZINE  50 mg Oral TID  . insulin aspart  0-15 Units Subcutaneous TID WC  . insulin aspart  0-5 Units Subcutaneous QHS  . insulin aspart  4 Units Subcutaneous TID WC  . insulin NPH Human  35 Units Subcutaneous BID AC  . isosorbide mononitrate  20 mg Oral BID  . polyethylene glycol  17 g Oral Daily  . saccharomyces boulardii  250 mg Oral Daily  . sodium chloride flush  3 mL Intravenous Q12H  . sodium chloride flush  3 mL Intravenous Q12H   Continuous Infusions: . sodium chloride    . sodium chloride    . sodium chloride 10 mL/hr at 12/18/18 0523  . sodium chloride    . sodium chloride     PRN Meds: sodium chloride, sodium chloride, sodium chloride, sodium chloride, acetaminophen, alteplase, heparin, lidocaine (PF), lidocaine-prilocaine, metolazone, nitroGLYCERIN, ondansetron (ZOFRAN) IV, pentafluoroprop-tetrafluoroeth, sodium chloride flush, sodium chloride flush   Vital Signs    Vitals:   12/17/18 1210 12/17/18 1930 12/17/18 2143 12/18/18 0530  BP: 128/62 133/62 137/75 (!) 164/79  Pulse: (!) 59 (!) 55  62  Resp: 18 18  18   Temp: 97.7 F (36.5 C) (!) 97.4 F (36.3 C)  97.6 F (36.4 C)  TempSrc: Oral Oral  Oral  SpO2: 92% 96%  100%  Weight:    109 kg  Height:        Intake/Output Summary (Last 24 hours) at 12/18/2018 0747 Last data filed at 12/18/2018 7414 Gross per 24 hour  Intake 9.83 ml  Output 1250 ml  Net -1240.17 ml   Last 3 Weights 12/18/2018 12/17/2018  12/16/2018  Weight (lbs) 240 lb 3.2 oz 241 lb 3.2 oz 240 lb 15.4 oz  Weight (kg) 108.954 kg 109.408 kg 109.3 kg      Telemetry    Atrial fibrillation with mildly reduced ventricular response - Personally Reviewed  Physical Exam   GEN: WD NAD Obese  Neck: supple, JVP difficult Cardiac: irregular and bradycardic, no gallop Respiratory: CTA GI: Soft, NT/ND, no masses MS: 1+ edema Neuro:  No focal findings   Labs    Chemistry Recent Labs  Lab 12/16/18 0424 12/17/18 0413 12/17/18 2215  NA 133* 136 136  K 2.8* 3.2* 3.8  CL 88* 93* 96*  CO2 26 28 28   GLUCOSE 101* 196* 172*  BUN 124* 86* 96*  CREATININE 3.98* 3.07* 3.26*  CALCIUM 9.1 9.0 9.2  ALBUMIN  --   --  3.4*  GFRNONAA 14* 19* 18*  GFRAA 16* 22* 21*  ANIONGAP 19* 15 12      Patient Profile     73 y.o. male with past medical history of chronic stage V kidney disease, hypertension, hyperlipidemia, diabetes mellitus, coronary artery disease admitted with volume overload/acute on chronic diastolic congestive heart failure.  Echocardiogram this admission shows vigorous LV function, moderate left ventricular hypertrophy, moderate left atrial enlargement.  Assessment & Plan  1 acute on chronic diastolic congestive heart failure/volume overload-this is felt secondary to a combination of diastolic dysfunction and worsening renal function.  Dialysis has been initiated and volume status improving.    2 acute on chronic stage V kidney disease-as outlined in previous notes patient has been evaluated for renal transplant at [facility].  However they would like cardiac catheterization performed prior to proceeding.  We therefore have scheduled the patient for cardiac catheterization today.  The risks and benefits including myocardial infarction, CVA, death and need for dialysis discussed previously and he agreed to proceed.  Nephrology is following and will dialyze patient after procedure today.  Will need to arrange  outpatient dialysis prior to discharge.  Once all procedures complete he will follow-up at [facility] to schedule renal transplant.  3 paroxysmal atrial fibrillation-patient remains in atrial fibrillation.  His rate is controlled on no medications.  Anticoagulation is on hold for catheterization.  Now that he is dialysis dependent will likely need to change apixaban to Coumadin until renal transplant performed.    4 coronary artery disease-continue statin.  Continue aspirin prior to catheterization.  5 hypertension-plan to continue present medications.  May need to reduce blood pressure medications over time given initiation of dialysis.  6 hyperlipidemia-continue statin.   For questions or updates, please contact [place] Please consult [email or web address] for contact info under        Signed, [name], MD  12/18/2018, 7:47 AM

## 2018-12-18 NOTE — Progress Notes (Signed)
[facility] NEPHROLOGY PROGRESS NOTE  Assessment/ Plan:  # Acute kidney Injury on chronic kidney disease stage IV: due to decompensated congestive heart failure/cardiorenal syndrome. Started hemodialysis since 12/16/2018 via [place].  Plan for cardiac cath today and then dialysis.  He still has some urine output.   Will not pursue permanent dialysis access at this time anticipating kidney transplant in the near future (he has a living donor ready). -Third treatment  tomorrow. -CLIP, ongoing  # Acute exacerbation of congestive heart failure: treated with diuretics and now on dialysis.  Plan for cardiac cath today.  He looks euvolemic on exam today.  #  Hypertension: Blood pressures acceptable.  Continue cardiac medications  #Hypokalemia: Secondary to diuretic induced losses. Adjust K in dialysate. Monitor lab.  # Hyponatremia: improved.    Subjective: Seen and examined at bedside.  Denied headache, dizziness, nausea, vomiting, chest pain or shortness of breath. Objective Vital signs in last 24 hours: Vitals:   12/17/18 1210 12/17/18 1930 12/17/18 2143 12/18/18 0530  BP: 128/62 133/62 137/75 (!) 164/79  Pulse: (!) 59 (!) 55  62  Resp: 18 18  18   Temp: 97.7 F (36.5 C) (!) 97.4 F (36.3 C)  97.6 F (36.4 C)  TempSrc: Oral Oral  Oral  SpO2: 92% 96%  100%  Weight:    109 kg  Height:       Weight change: -1.846 kg  Intake/Output Summary (Last 24 hours) at 12/18/2018 0944 Last data filed at 12/18/2018 0920 Gross per 24 hour  Intake 9.83 ml  Output 1050 ml  Net -1040.17 ml       Labs: Basic Metabolic Panel: Recent Labs  Lab 12/17/18 0413 12/17/18 2215 12/18/18 0500  NA 136 136 139  K 3.2* 3.8 3.4*  CL 93* 96* 98  CO2 28 28 28   GLUCOSE 196* 172* 108*  BUN 86* 96* 92*  CREATININE 3.07* 3.26* 3.01*  CALCIUM 9.0 9.2 9.7  PHOS  --  3.8  --    Liver Function Tests: Recent Labs  Lab 12/17/18 2215  ALBUMIN 3.4*   No results for input(s): LIPASE, AMYLASE  in the last 168 hours. No results for input(s): AMMONIA in the last 168 hours. CBC: Recent Labs  Lab 12/16/18 0939 12/17/18 0413 12/17/18 2215  WBC 8.8 7.1 8.5  HGB 12.7* 12.2* 12.2*  HCT 38.8* 37.7* 37.0*  MCV 90.7 91.3 90.9  PLT 288 236 252   Cardiac Enzymes: No results for input(s): CKTOTAL, CKMB, CKMBINDEX, TROPONINI in the last 168 hours. CBG: Recent Labs  Lab 12/17/18 0741 12/17/18 1137 12/17/18 1627 12/17/18 2140 12/18/18 0757  GLUCAP 162* 151* 161* 175* 111*    Iron Studies: No results for input(s): IRON, TIBC, TRANSFERRIN, FERRITIN in the last 72 hours. Studies/Results: Ir Fluoro Guide Cv Line Right  Result Date: 12/16/2018 CLINICAL DATA:  Renal failure, needs durable venous access for hemodialysis EXAM: TUNNELED HEMODIALYSIS CATHETER PLACEMENT WITH ULTRASOUND AND FLUOROSCOPIC GUIDANCE TECHNIQUE: The procedure, risks, benefits, and alternatives were explained to the patient. Questions regarding the procedure were encouraged and answered. The patient understands and consents to the procedure. As antibiotic prophylaxis, cefazolin 2 g was ordered pre-procedure and administered intravenously within one hour of incision. Patency of the right IJ vein was confirmed with ultrasound with image documentation. An appropriate skin site was determined. Region was prepped using maximum barrier technique including cap and mask, sterile gown, sterile gloves, large sterile sheet, and Chlorhexidine as cutaneous antisepsis. The region was infiltrated locally with 1% lidocaine.  Intravenous Fentanyl and Versed were administered as conscious sedation during continuous monitoring of the patient's level of consciousness and physiological / cardiorespiratory status by the radiology RN, with a total moderate sedation time of 11 minutes. Under real-time ultrasound guidance, the right IJ vein was accessed with a 21 gauge micropuncture needle; the needle tip within the vein was confirmed with ultrasound  image documentation. Needle exchanged over the 018 guidewire for transitional dilator, which allowed advancement of a Benson wire into the IVC. Over this, an MPA catheter was advanced. A Palindrome 19 hemodialysis catheter was tunneled from the right anterior chest wall approach to the right IJ dermatotomy site. The MPA catheter was exchanged over an Amplatz wire for serial vascular dilators which allow placement of a peel-away sheath, through which the catheter was advanced under intermittent fluoroscopy, positioned with its tips in the proximal and midright atrium. Spot chest radiograph confirms good catheter position. No pneumothorax. Catheter was flushed and primed per protocol. Catheter secured externally with O Prolene sutures. The right IJ dermatotomy site was closed with Dermabond. COMPLICATIONS: COMPLICATIONS None immediate FLUOROSCOPY TIME:  0.2 minutes; 99 uGym2 DAP COMPARISON:  None IMPRESSION: 1. Technically successful placement of tunneled right IJ hemodialysis catheter with ultrasound and fluoroscopic guidance. Ready for routine use. ACCESS: Remains approachable for percutaneous intervention as needed. Electronically Signed   By: Lucrezia Europe M.D.   On: 12/16/2018 16:18   Ir US Guide Vasc Access Right  Result Date: 12/16/2018 CLINICAL DATA:  Renal failure, needs durable venous access for hemodialysis EXAM: TUNNELED HEMODIALYSIS CATHETER PLACEMENT WITH ULTRASOUND AND FLUOROSCOPIC GUIDANCE TECHNIQUE: The procedure, risks, benefits, and alternatives were explained to the patient. Questions regarding the procedure were encouraged and answered. The patient understands and consents to the procedure. As antibiotic prophylaxis, cefazolin 2 g was ordered pre-procedure and administered intravenously within one hour of incision. Patency of the right IJ vein was confirmed with ultrasound with image documentation. An appropriate skin site was determined. Region was prepped using maximum barrier technique  including cap and mask, sterile gown, sterile gloves, large sterile sheet, and Chlorhexidine as cutaneous antisepsis. The region was infiltrated locally with 1% lidocaine. Intravenous Fentanyl and Versed were administered as conscious sedation during continuous monitoring of the patient's level of consciousness and physiological / cardiorespiratory status by the radiology RN, with a total moderate sedation time of 11 minutes. Under real-time ultrasound guidance, the right IJ vein was accessed with a 21 gauge micropuncture needle; the needle tip within the vein was confirmed with ultrasound image documentation. Needle exchanged over the 018 guidewire for transitional dilator, which allowed advancement of a Benson wire into the IVC. Over this, an MPA catheter was advanced. A Palindrome 19 hemodialysis catheter was tunneled from the right anterior chest wall approach to the right IJ dermatotomy site. The MPA catheter was exchanged over an Amplatz wire for serial vascular dilators which allow placement of a peel-away sheath, through which the catheter was advanced under intermittent fluoroscopy, positioned with its tips in the proximal and midright atrium. Spot chest radiograph confirms good catheter position. No pneumothorax. Catheter was flushed and primed per protocol. Catheter secured externally with O Prolene sutures. The right IJ dermatotomy site was closed with Dermabond. COMPLICATIONS: COMPLICATIONS None immediate FLUOROSCOPY TIME:  0.2 minutes; 99 uGym2 DAP COMPARISON:  None IMPRESSION: 1. Technically successful placement of tunneled right IJ hemodialysis catheter with ultrasound and fluoroscopic guidance. Ready for routine use. ACCESS: Remains approachable for percutaneous intervention as needed. Electronically Signed   By: Lucrezia Europe  M.D.   On: 12/16/2018 16:18    Medications: Infusions: . sodium chloride    . sodium chloride    . sodium chloride 10 mL/hr at 12/18/18 0523  . sodium chloride    .  sodium chloride      Scheduled Medications: . amLODipine  10 mg Oral Daily  . [START ON 12/19/2018] aspirin  81 mg Oral Daily  . atorvastatin  80 mg Oral Daily  . Chlorhexidine Gluconate Cloth  6 each Topical Q0600  . Chlorhexidine Gluconate Cloth  6 each Topical Q0600  . cholecalciferol  5,000 Units Oral Daily  . gabapentin  300 mg Oral Daily  . hydrALAZINE  50 mg Oral TID  . insulin aspart  0-15 Units Subcutaneous TID WC  . insulin aspart  0-5 Units Subcutaneous QHS  . insulin aspart  4 Units Subcutaneous TID WC  . insulin NPH Human  35 Units Subcutaneous BID AC  . isosorbide mononitrate  20 mg Oral BID  . polyethylene glycol  17 g Oral Daily  . potassium chloride  40 mEq Oral Once  . saccharomyces boulardii  250 mg Oral Daily  . sodium chloride flush  3 mL Intravenous Q12H  . sodium chloride flush  3 mL Intravenous Q12H    have reviewed scheduled and prn medications.  Physical Exam: General: Not in distress, comfortable Heart:RRR, s1s2 nl Lungs: Clear bilateral, no crackles Abdomen:soft, Non-tender, non-distended Extremities: No edema Dialysis Access: Right IJ tunnel catheter.  Site looks clean  [place] [employer] 12/18/2018,9:44 AM  LOS: 4 days

## 2018-12-18 NOTE — H&P (View-Only) (Signed)
Progress Note  Patient Name: Louis [name] Date of Encounter: 12/18/2018  Primary Cardiologist: Dr Stanford Breed  Subjective   Denies CP or dyspnea  Inpatient Medications    Scheduled Meds: . amLODipine  10 mg Oral Daily  . [START ON 12/19/2018] aspirin  81 mg Oral Daily  . atorvastatin  80 mg Oral Daily  . Chlorhexidine Gluconate Cloth  6 each Topical Q0600  . Chlorhexidine Gluconate Cloth  6 each Topical Q0600  . cholecalciferol  5,000 Units Oral Daily  . gabapentin  300 mg Oral Daily  . hydrALAZINE  50 mg Oral TID  . insulin aspart  0-15 Units Subcutaneous TID WC  . insulin aspart  0-5 Units Subcutaneous QHS  . insulin aspart  4 Units Subcutaneous TID WC  . insulin NPH Human  35 Units Subcutaneous BID AC  . isosorbide mononitrate  20 mg Oral BID  . polyethylene glycol  17 g Oral Daily  . saccharomyces boulardii  250 mg Oral Daily  . sodium chloride flush  3 mL Intravenous Q12H  . sodium chloride flush  3 mL Intravenous Q12H   Continuous Infusions: . sodium chloride    . sodium chloride    . sodium chloride 10 mL/hr at 12/18/18 0523  . sodium chloride    . sodium chloride     PRN Meds: sodium chloride, sodium chloride, sodium chloride, sodium chloride, acetaminophen, alteplase, heparin, lidocaine (PF), lidocaine-prilocaine, metolazone, nitroGLYCERIN, ondansetron (ZOFRAN) IV, pentafluoroprop-tetrafluoroeth, sodium chloride flush, sodium chloride flush   Vital Signs    Vitals:   12/17/18 1210 12/17/18 1930 12/17/18 2143 12/18/18 0530  BP: 128/62 133/62 137/75 (!) 164/79  Pulse: (!) 59 (!) 55  62  Resp: 18 18  18   Temp: 97.7 F (36.5 C) (!) 97.4 F (36.3 C)  97.6 F (36.4 C)  TempSrc: Oral Oral  Oral  SpO2: 92% 96%  100%  Weight:    109 kg  Height:        Intake/Output Summary (Last 24 hours) at 12/18/2018 0747 Last data filed at 12/18/2018 2836 Gross per 24 hour  Intake 9.83 ml  Output 1250 ml  Net -1240.17 ml   Last 3 Weights 12/18/2018 12/17/2018  12/16/2018  Weight (lbs) 240 lb 3.2 oz 241 lb 3.2 oz 240 lb 15.4 oz  Weight (kg) 108.954 kg 109.408 kg 109.3 kg      Telemetry    Atrial fibrillation with mildly reduced ventricular response - Personally Reviewed  Physical Exam   GEN: WD NAD Obese  Neck: supple, JVP difficult Cardiac: irregular and bradycardic, no gallop Respiratory: CTA GI: Soft, NT/ND, no masses MS: 1+ edema Neuro:  No focal findings   Labs    Chemistry Recent Labs  Lab 12/16/18 0424 12/17/18 0413 12/17/18 2215  NA 133* 136 136  K 2.8* 3.2* 3.8  CL 88* 93* 96*  CO2 26 28 28   GLUCOSE 101* 196* 172*  BUN 124* 86* 96*  CREATININE 3.98* 3.07* 3.26*  CALCIUM 9.1 9.0 9.2  ALBUMIN  --   --  3.4*  GFRNONAA 14* 19* 18*  GFRAA 16* 22* 21*  ANIONGAP 19* 15 12      Patient Profile     73 y.o. male with past medical history of chronic stage V kidney disease, hypertension, hyperlipidemia, diabetes mellitus, coronary artery disease admitted with volume overload/acute on chronic diastolic congestive heart failure.  Echocardiogram this admission shows vigorous LV function, moderate left ventricular hypertrophy, moderate left atrial enlargement.  Assessment & Plan  1 acute on chronic diastolic congestive heart failure/volume overload-this is felt secondary to a combination of diastolic dysfunction and worsening renal function.  Dialysis has been initiated and volume status improving.    2 acute on chronic stage V kidney disease-as outlined in previous notes patient has been evaluated for renal transplant at [facility].  However they would like cardiac catheterization performed prior to proceeding.  We therefore have scheduled the patient for cardiac catheterization today.  The risks and benefits including myocardial infarction, CVA, death and need for dialysis discussed previously and he agreed to proceed.  Nephrology is following and will dialyze patient after procedure today.  Will need to arrange  outpatient dialysis prior to discharge.  Once all procedures complete he will follow-up at [facility] to schedule renal transplant.  3 paroxysmal atrial fibrillation-patient remains in atrial fibrillation.  His rate is controlled on no medications.  Anticoagulation is on hold for catheterization.  Now that he is dialysis dependent will likely need to change apixaban to Coumadin until renal transplant performed.    4 coronary artery disease-continue statin.  Continue aspirin prior to catheterization.  5 hypertension-plan to continue present medications.  May need to reduce blood pressure medications over time given initiation of dialysis.  6 hyperlipidemia-continue statin.   For questions or updates, please contact [place] Please consult [email or web address] for contact info under        Signed, [name], MD  12/18/2018, 7:47 AM

## 2018-12-18 NOTE — Progress Notes (Signed)
ANTICOAGULATION CONSULT NOTE - Initial Consult  Pharmacy Consult for warfarin/heparin Indication: atrial fibrillation  Allergies  Allergen Reactions  . Hydrocodone Nausea And Vomiting    Other reaction(s): GI Upset (intolerance) Projectile vomiting  . Oxycodone Nausea And Vomiting    Other reaction(s): GI Upset (intolerance), Vomiting (intolerance) Projectile vomiting    Patient Measurements: Height: 5\' 9"  (175.3 cm) Weight: 240 lb 3.2 oz (109 kg)(scale a) IBW/kg (Calculated) : 70.7  Heparin dosing weight: 95 kg  Vital Signs: Temp: 97.7 F (36.5 C) (01/29 1352) Temp Source: Oral (01/29 1352) BP: 162/70 (01/29 1352) Pulse Rate: 66 (01/29 1352)  Labs: Recent Labs    12/16/18 0939 12/17/18 0413 12/17/18 2215 12/18/18 0500  HGB 12.7* 12.2* 12.2*  --   HCT 38.8* 37.7* 37.0*  --   PLT 288 236 252  --   LABPROT 17.0* 16.0*  --   --   INR 1.39 1.29  --   --   CREATININE  --  3.07* 3.26* 3.01*    Estimated Creatinine Clearance: 26.6 mL/min (A) (by C-G formula based on SCr of 3.01 mg/dL (H)).   Medical History: Past Medical History:  Diagnosis Date  . Brittle bones    per pt, has soft bones in right foot/wears boot cast!  . CAD (coronary artery disease)   . Cataract    Bil/ surg scheduled for right eye 01/18/17/ left eye 02/08/17  . Charcot ankle, right 2019  . CHF (congestive heart failure) ([place]) 2015  . Diabetes mellitus   . Heart failure, diastolic ([place])   . Hyperlipidemia   . Hypertension   . Macular edema 2014  . OSA on CPAP   . Personal history of colonic polyps - adenomas 01/28/2014  . Renal insufficiency   . Shortness of breath dyspnea   . Syncope and collapse      Assessment: 73 yo male with atrial fibrillation. AC has been on hold d/t plan for cath. Now s/p cath,  pharmacy consulted to start warfarin with heparin bridge (8 hours post sheath removal).  Patient was on apixaban at home. However, now that patient is starting dialysis, changing to  warfarin with a heparin bridge. Baseline INR 1.29 on 1/28 Heparin to start 8 hours post sheath removal.  13:13:52   ___1/29/20 ___Sheath Removal      Plan:  Give warfarin 5 mg po x 1 Monitor daily INR, CBC, clinical course, s/sx of bleed, PO intake, DDI  Start heparin infusion at 1450 units/hr @ 2115 (8 hours post sheath) Check anti-Xa level in 8 hours and daily while on heparin Continue heparin until INR therapeutic x 2 days and at least 5 days of overlap   Thank you for allowing [place] to participate in this patients care.   Jens Som, PharmD Please utilize Amion (under [place]) for appropriate number for your unit pharmacist. 12/18/2018 2:07 PM

## 2018-12-19 ENCOUNTER — Encounter (HOSPITAL_COMMUNITY): Payer: Self-pay | Admitting: Cardiovascular Disease

## 2018-12-19 LAB — CBC
HCT: 39.5 % (ref 39.0–52.0)
Hemoglobin: 12.8 g/dL — ABNORMAL LOW (ref 13.0–17.0)
MCH: 29.6 pg (ref 26.0–34.0)
MCHC: 32.4 g/dL (ref 30.0–36.0)
MCV: 91.4 fL (ref 80.0–100.0)
Platelets: 227 10*3/uL (ref 150–400)
RBC: 4.32 MIL/uL (ref 4.22–5.81)
RDW: 15.9 % — ABNORMAL HIGH (ref 11.5–15.5)
WBC: 9.4 10*3/uL (ref 4.0–10.5)
nRBC: 0 % (ref 0.0–0.2)

## 2018-12-19 LAB — BASIC METABOLIC PANEL
Anion gap: 13 (ref 5–15)
BUN: 52 mg/dL — ABNORMAL HIGH (ref 8–23)
CO2: 27 mmol/L (ref 22–32)
Calcium: 9.1 mg/dL (ref 8.9–10.3)
Chloride: 98 mmol/L (ref 98–111)
Creatinine, Ser: 2.34 mg/dL — ABNORMAL HIGH (ref 0.61–1.24)
GFR calc Af Amer: 31 mL/min — ABNORMAL LOW (ref >60)
GFR calc non Af Amer: 27 mL/min — ABNORMAL LOW (ref >60)
Glucose, Bld: 129 mg/dL — ABNORMAL HIGH (ref 70–99)
Potassium: 4 mmol/L (ref 3.5–5.1)
Sodium: 138 mmol/L (ref 135–145)

## 2018-12-19 LAB — GLUCOSE, CAPILLARY
Glucose-Capillary: 116 mg/dL — ABNORMAL HIGH (ref 70–99)
Glucose-Capillary: 108 mg/dL — ABNORMAL HIGH (ref 70–99)
Glucose-Capillary: 143 mg/dL — ABNORMAL HIGH (ref 70–99)
Glucose-Capillary: 81 mg/dL (ref 70–99)
Glucose-Capillary: 181 mg/dL — ABNORMAL HIGH (ref 70–99)

## 2018-12-19 LAB — POCT I-STAT 7, (LYTES, BLD GAS, ICA,H+H)
Acid-Base Excess: 7 mmol/L — ABNORMAL HIGH (ref 0.0–2.0)
Bicarbonate: 31.7 mmol/L — ABNORMAL HIGH (ref 20.0–28.0)
Calcium, Ion: 1.15 mmol/L (ref 1.15–1.40)
HCT: 38 % — ABNORMAL LOW (ref 39.0–52.0)
Hemoglobin: 12.9 g/dL — ABNORMAL LOW (ref 13.0–17.0)
O2 Saturation: 97 %
Potassium: 3.4 mmol/L — ABNORMAL LOW (ref 3.5–5.1)
Sodium: 141 mmol/L (ref 135–145)
TCO2: 33 mmol/L — ABNORMAL HIGH (ref 22–32)
pCO2 arterial: 44.4 mmHg (ref 32.0–48.0)
pH, Arterial: 7.463 — ABNORMAL HIGH (ref 7.350–7.450)
pO2, Arterial: 82 mmHg — ABNORMAL LOW (ref 83.0–108.0)

## 2018-12-19 LAB — HEPARIN LEVEL (UNFRACTIONATED)
Heparin Unfractionated: 0.57 IU/mL (ref 0.30–0.70)
Heparin Unfractionated: 0.62 IU/mL (ref 0.30–0.70)

## 2018-12-19 LAB — PROTIME-INR
INR: 1.28
PROTHROMBIN TIME: 15.9 s — AB (ref 11.4–15.2)

## 2018-12-19 MED ORDER — SODIUM CHLORIDE 0.9 % IV SOLN: 100.0000 mL | INTRAVENOUS | Status: DC | PRN

## 2018-12-19 MED ORDER — LIDOCAINE HCL (PF) 1 % IJ SOLN: 5.0000 mL | INTRAMUSCULAR | Status: DC | PRN

## 2018-12-19 MED ORDER — GABAPENTIN 600 MG PO TABS: 300.0000 mg | ORAL_TABLET | Freq: Every day | ORAL | 0 refills | Status: DC

## 2018-12-19 MED ORDER — WARFARIN SODIUM 5 MG PO TABS: 5.0000 mg | ORAL_TABLET | Freq: Every day | ORAL | 0 refills | Status: DC

## 2018-12-19 MED ORDER — PENTAFLUOROPROP-TETRAFLUOROETH EX AERO: 1.0000 "application " | INHALATION_SPRAY | CUTANEOUS | Status: DC | PRN

## 2018-12-19 MED ORDER — HEPARIN SODIUM (PORCINE) 1000 UNIT/ML DIALYSIS
1000.0000 [IU] | INTRAMUSCULAR | Status: DC | PRN
  Administered 2018-12-19: 1000 [IU] via INTRAVENOUS_CENTRAL
  Filled 2018-12-19: qty 1

## 2018-12-19 MED ORDER — HEPARIN SODIUM (PORCINE) 1000 UNIT/ML IJ SOLN
INTRAMUSCULAR | Status: AC
  Filled 2018-12-19: qty 4

## 2018-12-19 MED ORDER — HYDRALAZINE HCL 50 MG PO TABS
100.0000 mg | ORAL_TABLET | Freq: Three times a day (TID) | ORAL | Status: DC
  Administered 2018-12-19 – 2018-12-20 (×2): 100 mg via ORAL
  Filled 2018-12-19 (×2): qty 2

## 2018-12-19 MED ORDER — ALTEPLASE 2 MG IJ SOLR: 2.0000 mg | Freq: Once | INTRAMUSCULAR | Status: DC | PRN

## 2018-12-19 MED ORDER — WARFARIN SODIUM 5 MG PO TABS
5.0000 mg | ORAL_TABLET | Freq: Once | ORAL | Status: AC
  Administered 2018-12-19: 5 mg via ORAL
  Filled 2018-12-19: qty 1

## 2018-12-19 MED ORDER — LIDOCAINE-PRILOCAINE 2.5-2.5 % EX CREA: 1.0000 "application " | TOPICAL_CREAM | CUTANEOUS | Status: DC | PRN

## 2018-12-19 MED ORDER — CYCLOBENZAPRINE HCL 5 MG PO TABS: 5.0000 mg | ORAL_TABLET | Freq: Three times a day (TID) | ORAL | Status: DC | PRN

## 2018-12-19 NOTE — Progress Notes (Signed)
Dialysis RN states heparin bag is complete. RN notified pharmacy and pharmacist Helene Kelp) states not to hang another bag since pt is being discharged after dialysis. Pharmacist states to give coumadin prior to discharge. Rip Harbour, RN notified.

## 2018-12-19 NOTE — Plan of Care (Signed)
  Problem: Education: Goal: Knowledge of disease or condition will improve Outcome: Progressing Goal: Understanding of medication regimen will improve Outcome: Progressing Goal: Individualized Educational Video(s) Outcome: Progressing   Problem: Activity: Goal: Ability to tolerate increased activity will improve Outcome: Progressing   Problem: Cardiac: Goal: Ability to achieve and maintain adequate cardiopulmonary perfusion will improve Outcome: Progressing   Problem: Health Behavior/Discharge Planning: Goal: Ability to safely manage health-related needs after discharge will improve Outcome: Progressing   Problem: Education: Goal: Ability to demonstrate management of disease process will improve Outcome: Progressing Goal: Ability to verbalize understanding of medication therapies will improve Outcome: Progressing Goal: Individualized Educational Video(s) Outcome: Progressing   Problem: Activity: Goal: Capacity to carry out activities will improve Outcome: Progressing   Problem: Cardiac: Goal: Ability to achieve and maintain adequate cardiopulmonary perfusion will improve Outcome: Progressing   Problem: Education: Goal: Knowledge of General Education information will improve Description: Including pain rating scale, medication(s)/side effects and non-pharmacologic comfort measures Outcome: Progressing   Problem: Health Behavior/Discharge Planning: Goal: Ability to manage health-related needs will improve Outcome: Progressing   Problem: Clinical Measurements: Goal: Ability to maintain clinical measurements within normal limits will improve Outcome: Progressing Goal: Will remain free from infection Outcome: Progressing Goal: Diagnostic test results will improve Outcome: Progressing Goal: Respiratory complications will improve Outcome: Progressing Goal: Cardiovascular complication will be avoided Outcome: Progressing   Problem: Activity: Goal: Risk for activity  intolerance will decrease Outcome: Progressing   Problem: Nutrition: Goal: Adequate nutrition will be maintained Outcome: Progressing   Problem: Coping: Goal: Level of anxiety will decrease Outcome: Progressing   Problem: Elimination: Goal: Will not experience complications related to bowel motility Outcome: Progressing Goal: Will not experience complications related to urinary retention Outcome: Progressing   Problem: Pain Managment: Goal: General experience of comfort will improve Outcome: Progressing   Problem: Safety: Goal: Ability to remain free from injury will improve Outcome: Progressing   Problem: Skin Integrity: Goal: Risk for impaired skin integrity will decrease Outcome: Progressing   

## 2018-12-19 NOTE — Progress Notes (Signed)
   I have called Mr. Macmaster' PCP office and scheduled for hospital follow up with INR check on Monday 12/23/18 at 8:30 with Dr. Madilyn Fireman.   Daune Perch, AGNP-C [facility] 12/19/2018  12:00 PM Pager: [phone]

## 2018-12-19 NOTE — Progress Notes (Addendum)
Progress Note  Patient Name: Louis [name] Date of Encounter: 12/19/2018  Primary Cardiologist: Dr Stanford Breed  Subjective   Denies CP or dyspnea  Inpatient Medications    Scheduled Meds: . heparin      . amLODipine  10 mg Oral Daily  . aspirin  81 mg Oral Daily  . atorvastatin  80 mg Oral Daily  . Chlorhexidine Gluconate Cloth  6 each Topical Q0600  . Chlorhexidine Gluconate Cloth  6 each Topical Q0600  . Chlorhexidine Gluconate Cloth  6 each Topical Q0600  . cholecalciferol  5,000 Units Oral Daily  . gabapentin  300 mg Oral Daily  . hydrALAZINE  50 mg Oral TID  . insulin aspart  0-15 Units Subcutaneous TID WC  . insulin aspart  0-5 Units Subcutaneous QHS  . insulin aspart  4 Units Subcutaneous TID WC  . insulin NPH Human  35 Units Subcutaneous BID AC  . isosorbide mononitrate  20 mg Oral BID  . polyethylene glycol  17 g Oral Daily  . saccharomyces boulardii  250 mg Oral Daily  . sodium chloride flush  3 mL Intravenous Q12H  . sodium chloride flush  3 mL Intravenous Q12H  . Warfarin - Pharmacist Dosing Inpatient   Does not apply q1800   Continuous Infusions: . sodium chloride    . heparin 1,450 Units/hr (12/18/18 2229)   PRN Meds: sodium chloride, acetaminophen, alteplase, cyclobenzaprine, heparin, lidocaine (PF), lidocaine-prilocaine, nitroGLYCERIN, ondansetron (ZOFRAN) IV, pentafluoroprop-tetrafluoroeth, sodium chloride flush, sodium chloride flush   Vital Signs    Vitals:   12/18/18 2131 12/18/18 2225 12/19/18 0541 12/19/18 0921  BP: (!) 164/74 (!) 170/75 (!) 177/80 (!) 162/80  Pulse: 74 79 65   Resp: 18 18 18    Temp: 98 F (36.7 C) 97.6 F (36.4 C) (!) 97.5 F (36.4 C)   TempSrc: Oral Oral Oral   SpO2: 98% 97% 98%   Weight: 107 kg  106.5 kg   Height:        Intake/Output Summary (Last 24 hours) at 12/19/2018 0957 Last data filed at 12/19/2018 0500 Gross per 24 hour  Intake 243 ml  Output 2900 ml  Net -2657 ml   Last 3 Weights 12/19/2018 12/18/2018  12/18/2018  Weight (lbs) 234 lb 11.2 oz 235 lb 14.3 oz 240 lb 4.8 oz  Weight (kg) 106.459 kg 107 kg 109 kg      Telemetry    Atrial fibrillation with mildly reduced ventricular response - Personally Reviewed  Physical Exam   GEN: NAD Neck: supple Cardiac: irregular and bradycardic Respiratory: CTA; no wheeze GI: Soft, not tender, not distended; right groin with tenderness but no hematoma and no bruit MS: trace edema Neuro:  Grossly intact   Labs    Chemistry Recent Labs  Lab 12/17/18 2215 12/18/18 0500 12/18/18 1233 12/19/18 0452  NA 136 139 141  140 138  K 3.8 3.4* 3.4*  3.4* 4.0  CL 96* 98  --  98  CO2 28 28  --  27  GLUCOSE 172* 108*  --  129*  BUN 96* 92*  --  52*  CREATININE 3.26* 3.01*  --  2.34*  CALCIUM 9.2 9.7  --  9.1  ALBUMIN 3.4*  --   --   --   GFRNONAA 18* 20*  --  27*  GFRAA 21* 23*  --  31*  ANIONGAP 12 13  --  13      Patient Profile     73 y.o. male with past  medical history of chronic stage V kidney disease, hypertension, hyperlipidemia, diabetes mellitus, coronary artery disease admitted with volume overload/acute on chronic diastolic congestive heart failure.  Echocardiogram this admission shows vigorous LV function, moderate left ventricular hypertrophy, moderate left atrial enlargement.  Cardiac catheterization revealed 60% left main with negative FFR, 50% circumflex, chronically occluded LAD, 60% right coronary artery.  Plan is medical therapy.  Assessment & Plan    1 acute on chronic diastolic congestive heart failure/volume overload-volume status is much improved.  Being managed now by dialysis.  2 acute on chronic stage V kidney disease-cardiac catheterization as outlined.  Plan will be medical therapy.  His LV function is preserved.  No further cardiac work-up prior to renal transplant.  As outlined in previous notes patient has been evaluated for renal transplant at [facility].  Nephrology is arranging dialysis as an  outpatient.  3 paroxysmal atrial fibrillation-patient remains in atrial fibrillation.  His rate is controlled on no medications.  He is now dialysis dependent and we therefore have discontinued apixaban and will treat with Coumadin with goal INR 2-3.  We will need to arrange follow-up in Coumadin clinic possibly with his primary care physician in [place].  If that is not an option we will need to arrange at [facility] office.     4 coronary artery disease-continue statin.  Discontinue aspirin given need for Coumadin.  5 hypertension-blood pressure is mildly elevated.  Increase hydralazine to 100 mg p.o. 3 times daily.  Toprol and Lasix have been discontinued.  Blood pressure may decrease now that dialysis is initiated.  Follow blood pressure and adjust regimen after discharge.  6 hyperlipidemia-continue statin.  Patient can be discharged from a cardiac standpoint. [facility] will sign off.   Medication Recommendations: Continue medications as listed in MAR. Other recommendations (labs, testing, etc): We will need INR checked on Monday, February 3.  Needs dialysis arranged.  [facility] for consideration of renal transplant. Follow up as an outpatient: Follow-up with me in [place] 4 to 6 weeks following discharge.   For questions or updates, please contact [place] Please consult [email or web address] for contact info under        Signed, [name], MD  12/19/2018, 9:57 AM

## 2018-12-19 NOTE — Progress Notes (Signed)
[place] for Heparin  Indication: atrial fibrillation  Allergies  Allergen Reactions  . Hydrocodone Nausea And Vomiting    Other reaction(s): GI Upset (intolerance) Projectile vomiting  . Oxycodone Nausea And Vomiting    Other reaction(s): GI Upset (intolerance), Vomiting (intolerance) Projectile vomiting    Patient Measurements: Height: 5\' 9"  (175.3 cm) Weight: 234 lb 11.2 oz (106.5 kg)(scale a) IBW/kg (Calculated) : 70.7  Heparin dosing weight: 95 kg  Vital Signs: Temp: 97.5 F (36.4 C) (01/30 0541) Temp Source: Oral (01/30 0541) BP: 177/80 (01/30 0541) Pulse Rate: 65 (01/30 0541)  Labs: Recent Labs    12/16/18 0939 12/17/18 0413 12/17/18 2215 12/18/18 0500 12/18/18 1233 12/19/18 0452  HGB 12.7* 12.2* 12.2*  --  12.6* 12.8*  HCT 38.8* 37.7* 37.0*  --  37.0* 39.5  PLT 288 236 252  --   --  227  LABPROT 17.0* 16.0*  --   --   --  15.9*  INR 1.39 1.29  --   --   --  1.28  HEPARINUNFRC  --   --   --   --   --  0.57  CREATININE  --  3.07* 3.26* 3.01*  --   --     Estimated Creatinine Clearance: 26.3 mL/min (A) (by C-G formula based on SCr of 3.01 mg/dL (H)).   Medical History: Past Medical History:  Diagnosis Date  . Brittle bones    per pt, has soft bones in right foot/wears boot cast!  . CAD (coronary artery disease)   . Cataract    Bil/ surg scheduled for right eye 01/18/17/ left eye 02/08/17  . Charcot ankle, right 2019  . CHF (congestive heart failure) ([place]) 2015  . Diabetes mellitus   . Heart failure, diastolic ([place])   . Hyperlipidemia   . Hypertension   . Macular edema 2014  . OSA on CPAP   . Personal history of colonic polyps - adenomas 01/28/2014  . Renal insufficiency   . Shortness of breath dyspnea   . Syncope and collapse      Assessment: 73 yo male with atrial fibrillation. AC has been on hold d/t plan for cath. Now s/p cath,  pharmacy consulted to start warfarin with heparin bridge (8 hours post  sheath removal).  Patient was on apixaban at home. However, now that patient is starting dialysis, changing to warfarin with a heparin bridge. Baseline INR 1.29 on 1/28 Heparin to start 8 hours post sheath removal.  13:13:52   ___1/29/20 ___Sheath Removal    1/30 AM update: initial heparin level therapeutic at 0.57, INR 1.28, Hgb stable    Plan:  Cont heparin at 1450 units/hr Confirmatory heparin level at [place], PharmD, [place] Pharmacist Phone: [phone]

## 2018-12-19 NOTE — Discharge Summary (Signed)
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  Found to be in acute on chronic CHF and cardiorenal syndrome.  Cardiology and nephrology consulted.  Started on dialysis here.  He also has a living donor for kidney transplant and he is being planned for kidney transplant at [facility].  He underwent cardiac catheterization here  for preparation for kidney transplant .  Outpatient dialysis facility has been finalized for him.  Eliquis has been stopped and he has been started on Coumadin. He is stable for discharge to home today.  Following problems were addressed during his hospitalization:  Anasarca: Secondary to heart failure exacerbation.  Much improved.   Not on diuretics since he has been started on dialysis.  Acute on chronic diastolic heart failure: EF of  65 to 70%.  Cardiology following.   He underwent cardiac catheterization by cardiology today as a preparation for renal  transplant at [facility]. Cath revealed 60% left main with negative FFR, 50% circumflex, chronically occluded LAD, 60% right coronary artery.  Continue medical therapy.  He will follow-up with cardiology as an outpatient.  AKI on CKD stage IV: Cardiorenal syndrome.  Baseline creatinine is 2.4-2.6.  Creatinine up to 3.98 at some point.  Nephrology was consulted and started on dialysis.  outpatient dialysis facility set up.   Has a temporary dialysis catheter on his right chest.  He will follow-up with [facility] for kidney transplant.  He has a living donor.  Diabetic peripheral neuropathy: Continue gabapentin.  Hyperlipidemia: Continue Lipitor  Hypertension: Currently stable.  Continue Imdur, hydralazine, amlodipine.  Chronic A. fib: Currently rate is controlled.  Metoprolol discontinued due to bradycardia.  Was on Eliquis which will be stopped. Started on coumadin  because of his ESRD.  After renal transplant, he might be switched back to Eliquis.  We will follow-up with his PCP on Monday to check his INR.  Bradycardia: Resolved  Diabetes type 2: On insulin at home.Hemoglobin A1c of 6.5 as per 11/27/2017  Sleep apnea: Continue CPAP    Discharge Diagnoses:  Principal Problem:   Anasarca Active Problems:   Diabetes mellitus with stage 4 chronic kidney disease (HCC)   Chronic kidney disease, stage 4, severely decreased GFR (HCC)   Diabetic polyneuropathy associated with type 2 diabetes mellitus (HCC)   Heart failure, diastolic (HCC)   Diabetic peripheral neuropathy (HCC)   Acute diastolic CHF (congestive heart failure) ([place])    Discharge Instructions  Discharge Instructions    Diet - low sodium heart healthy   Complete by:  As directed    Discharge instructions   Complete by:  As directed    1) Please follow-up with your PCP on February 3, Monday.  Check INR during the visit. 2)Continue to monitor INR by following with your PCP.  INR goal should be between 2-3. 3)Follow with nephrology and cardiology as an outpatient. 3)Follow up with outpatient dialysis center. 5)Follow up with your kidney transplant provider at [facility].   Increase activity slowly   Complete by:  As directed      Allergies as of 12/19/2018      Reactions   Hydrocodone Nausea And Vomiting   Other reaction(s): GI Upset (intolerance) Projectile vomiting   Oxycodone Nausea And Vomiting   Other reaction(s): GI  Upset (intolerance), Vomiting (intolerance) Projectile vomiting      Medication List    STOP taking these medications   doxycycline 100 MG tablet Commonly known as:  VIBRA-TABS   ELIQUIS 5 MG Tabs tablet Generic drug:  apixaban   furosemide 80 MG tablet Commonly known as:  LASIX   metolazone 5 MG tablet Commonly known as:  ZAROXOLYN   metoprolol succinate 50 MG 24 hr tablet Commonly known as:  TOPROL-XL   potassium chloride SA 20 MEQ tablet Commonly known as:  K-DUR,KLOR-CON     TAKE these medications   allopurinol 100 MG tablet Commonly known as:  ZYLOPRIM TAKE 3 TABLETS BY MOUTH 2 TIMES A DAY What changed:  See the new instructions.   AMBULATORY NON FORMULARY MEDICATION Rollator walker with seat. Dx: M25.561, M54.12, M10.00   AMBULATORY NON FORMULARY MEDICATION Continuous positive airway pressure (CPAP) device: Auto titrate minimum 4 cm H20 to maximum of 20 cm H2O with pressure. Please provide all supplemental supplies as needed. Fax to: [phone]   amLODipine 10 MG tablet Commonly known as:  NORVASC Take 1 tablet (10 mg total) by mouth at bedtime. What changed:  when to take this   atorvastatin 80 MG tablet Commonly known as:  LIPITOR Take 1 tablet (80 mg total) by mouth daily.   B-D INS SYR ULTRAFINE 1CC/31G 31G X 5/16" 1 ML Misc Generic drug:  Insulin Syringe-Needle U-100   CO Q 10 PO Take 1 tablet by mouth daily.   gabapentin 600 MG tablet Commonly known as:  NEURONTIN Take 0.5 tablets (300 mg total) by mouth daily. Start taking on:  December 20, 2018 What changed:    how much to take  when to take this   hydrALAZINE 100 MG tablet Commonly known as:  APRESOLINE Take 1 tablet (100 mg total) by mouth 3 (three) times daily.   isosorbide mononitrate 20 MG tablet Commonly known as:  ISMO,MONOKET TAKE 1 TABLET (20 MG TOTAL) BY MOUTH 2 (TWO) TIMES DAILY AT 10 AM AND 5 PM.   nitroGLYCERIN 0.4 MG SL tablet Commonly known as:  NITROSTAT Place 1  tablet (0.4 mg total) under the tongue every 5 (five) minutes as needed for chest pain.   NOVOLIN N 100 UNIT/ML injection Generic drug:  insulin NPH Human Inject 35 Units into the skin 2 (two) times daily before a meal.   NOVOLIN R 100 units/mL injection Generic drug:  insulin regular Inject 15-20 Units into the skin 3 (three) times daily with meals.   ONE TOUCH ULTRA TEST test strip Generic drug:  glucose blood USE TO CHECK BLOOD SUGAR 4 TIMES A DAY DX E11.65   RED YEAST RICE PO Take 1 tablet by mouth 2 (two) times daily.   saccharomyces boulardii 250 MG capsule Commonly known as:  FLORASTOR Take 250 mg by mouth daily.   Vitamin D3 125 MCG (5000 UT) Caps Take 5,000 Units by mouth daily.   warfarin 5 MG tablet Commonly known as:  COUMADIN Take 1 tablet (5 mg total) by mouth daily at 6 PM for 30 days. Monitor INR. INR goal should be between 2-3. Start taking on:  December 20, 2018      Follow-up Information    [name], MD  Follow up in 5 day(s).   Specialty:  Family Medicine Why:  Make appointment on Monday ,Feb3 Contact information: [address] [place] [phone]          Allergies  Allergen Reactions  . Hydrocodone Nausea And Vomiting    Other reaction(s): GI Upset (intolerance) Projectile vomiting  . Oxycodone Nausea And Vomiting    Other reaction(s): GI Upset (intolerance), Vomiting (intolerance) Projectile vomiting    Consultations:  Cardiology, nephrology   Procedures/Studies: Dg Chest 2 View  Result Date: 12/12/2018 CLINICAL DATA:  73 year old male with a history of shortness of breath EXAM: CHEST - 2 VIEW COMPARISON:  02/02/2017 FINDINGS: Cardiomediastinal silhouette unchanged in size and contour. Interlobular septal thickening with fullness in the central vasculature. Blunting of the bilateral costophrenic angles. Meniscus within the costophrenic sulcus on the lateral view. Degenerative changes with  no acute displaced fracture IMPRESSION: Congestive heart failure with small pleural effusions. Electronically Signed   By: Corrie Mckusick D.O.   On: 12/12/2018 09:34   Ir Fluoro Guide Cv Line Right  Result Date: 12/16/2018 CLINICAL DATA:  Renal failure, needs durable venous access for hemodialysis EXAM: TUNNELED HEMODIALYSIS CATHETER PLACEMENT WITH ULTRASOUND AND FLUOROSCOPIC GUIDANCE TECHNIQUE: The procedure, risks, benefits, and alternatives were explained to the patient. Questions regarding the procedure were encouraged and answered. The patient understands and consents to the procedure. As antibiotic prophylaxis, cefazolin 2 g was ordered pre-procedure and administered intravenously within one hour of incision. Patency of the right IJ vein was confirmed with ultrasound with image documentation. An appropriate skin site was determined. Region was prepped using maximum barrier technique including cap and mask, sterile gown, sterile gloves, large sterile sheet, and Chlorhexidine as cutaneous antisepsis. The region was infiltrated locally with 1% lidocaine. Intravenous Fentanyl and Versed were administered as conscious sedation during continuous monitoring of the patient's level of consciousness and physiological / cardiorespiratory status by the radiology RN, with a total moderate sedation time of 11 minutes. Under real-time ultrasound guidance, the right IJ vein was accessed with a 21 gauge micropuncture needle; the needle tip within the vein was confirmed with ultrasound image documentation. Needle exchanged over the 018 guidewire for transitional dilator, which allowed advancement of a Benson wire into the IVC. Over this, an MPA catheter was advanced. A Palindrome 19 hemodialysis catheter was tunneled from the right anterior chest wall approach to the right IJ dermatotomy site. The MPA catheter was exchanged over an Amplatz wire for serial vascular dilators which allow placement of a peel-away sheath, through  which the catheter was advanced under intermittent fluoroscopy, positioned with its tips in the proximal and midright atrium. Spot chest radiograph confirms good catheter position. No pneumothorax. Catheter was flushed and primed per protocol. Catheter secured externally with O Prolene sutures. The right IJ dermatotomy site was closed with Dermabond. COMPLICATIONS: COMPLICATIONS None immediate FLUOROSCOPY TIME:  0.2 minutes; 99 uGym2 DAP COMPARISON:  None IMPRESSION: 1. Technically successful placement of tunneled right IJ hemodialysis catheter with ultrasound and fluoroscopic guidance. Ready for routine use. ACCESS: Remains approachable for percutaneous intervention as needed. Electronically Signed   By: Lucrezia Europe M.D.   On: 12/16/2018 16:18   Ir US Guide Vasc Access Right  Result Date: 12/16/2018 CLINICAL DATA:  Renal failure, needs durable venous access for hemodialysis EXAM: TUNNELED HEMODIALYSIS CATHETER PLACEMENT WITH ULTRASOUND AND FLUOROSCOPIC GUIDANCE TECHNIQUE: The procedure, risks, benefits, and alternatives were explained to the patient. Questions regarding the procedure were encouraged and answered. The patient  understands and consents to the procedure. As antibiotic prophylaxis, cefazolin 2 g was ordered pre-procedure and administered intravenously within one hour of incision. Patency of the right IJ vein was confirmed with ultrasound with image documentation. An appropriate skin site was determined. Region was prepped using maximum barrier technique including cap and mask, sterile gown, sterile gloves, large sterile sheet, and Chlorhexidine as cutaneous antisepsis. The region was infiltrated locally with 1% lidocaine. Intravenous Fentanyl and Versed were administered as conscious sedation during continuous monitoring of the patient's level of consciousness and physiological / cardiorespiratory status by the radiology RN, with a total moderate sedation time of 11 minutes. Under real-time  ultrasound guidance, the right IJ vein was accessed with a 21 gauge micropuncture needle; the needle tip within the vein was confirmed with ultrasound image documentation. Needle exchanged over the 018 guidewire for transitional dilator, which allowed advancement of a Benson wire into the IVC. Over this, an MPA catheter was advanced. A Palindrome 19 hemodialysis catheter was tunneled from the right anterior chest wall approach to the right IJ dermatotomy site. The MPA catheter was exchanged over an Amplatz wire for serial vascular dilators which allow placement of a peel-away sheath, through which the catheter was advanced under intermittent fluoroscopy, positioned with its tips in the proximal and midright atrium. Spot chest radiograph confirms good catheter position. No pneumothorax. Catheter was flushed and primed per protocol. Catheter secured externally with O Prolene sutures. The right IJ dermatotomy site was closed with Dermabond. COMPLICATIONS: COMPLICATIONS None immediate FLUOROSCOPY TIME:  0.2 minutes; 99 uGym2 DAP COMPARISON:  None IMPRESSION: 1. Technically successful placement of tunneled right IJ hemodialysis catheter with ultrasound and fluoroscopic guidance. Ready for routine use. ACCESS: Remains approachable for percutaneous intervention as needed. Electronically Signed   By: Lucrezia Europe M.D.   On: 12/16/2018 16:18       Subjective: Patient seen and examined the bedside this morning.  Hemodynamically stable.  Will be discharged after dialysis today.  Discharge Exam: Vitals:   12/19/18 0541 12/19/18 0921  BP: (!) 177/80 (!) 162/80  Pulse: 65   Resp: 18   Temp: (!) 97.5 F (36.4 C)   SpO2: 98%    Vitals:   12/18/18 2131 12/18/18 2225 12/19/18 0541 12/19/18 0921  BP: (!) 164/74 (!) 170/75 (!) 177/80 (!) 162/80  Pulse: 74 79 65   Resp: 18 18 18    Temp: 98 F (36.7 C) 97.6 F (36.4 C) (!) 97.5 F (36.4 C)   TempSrc: Oral Oral Oral   SpO2: 98% 97% 98%   Weight: 107 kg  106.5  kg   Height:        General: Pt is alert, awake, not in acute distress Cardiovascular: RRR, S1/S2 +, no rubs, no gallops Respiratory: CTA bilaterally, no wheezing, no rhonchi Abdominal: Soft, NT, ND, bowel sounds + Extremities: no edema, no cyanosis    The results of significant diagnostics from this hospitalization (including imaging, microbiology, ancillary and laboratory) are listed below for reference.     Microbiology: No results found for this or any previous visit (from the past 240 hour(s)).   Labs: BNP (last 3 results) No results for input(s): BNP in the last 8760 hours. Basic Metabolic Panel: Recent Labs  Lab 12/16/18 0424 12/17/18 0413 12/17/18 2215 12/18/18 0500 12/18/18 1233 12/19/18 0452  NA 133* 136 136 139 141  140 138  K 2.8* 3.2* 3.8 3.4* 3.4*  3.4* 4.0  CL 88* 93* 96* 98  --  98  CO2 26  28 28 28   --  27  GLUCOSE 101* 196* 172* 108*  --  129*  BUN 124* 86* 96* 92*  --  52*  CREATININE 3.98* 3.07* 3.26* 3.01*  --  2.34*  CALCIUM 9.1 9.0 9.2 9.7  --  9.1  MG 2.4 2.2  --   --   --   --   PHOS  --   --  3.8  --   --   --    Liver Function Tests: Recent Labs  Lab 12/17/18 2215  ALBUMIN 3.4*   No results for input(s): LIPASE, AMYLASE in the last 168 hours. No results for input(s): AMMONIA in the last 168 hours. CBC: Recent Labs  Lab 12/16/18 0939 12/17/18 0413 12/17/18 2215 12/18/18 1233 12/19/18 0452  WBC 8.8 7.1 8.5  --  9.4  HGB 12.7* 12.2* 12.2* 12.9*  12.6* 12.8*  HCT 38.8* 37.7* 37.0* 38.0*  37.0* 39.5  MCV 90.7 91.3 90.9  --  91.4  PLT 288 236 252  --  227   Cardiac Enzymes: No results for input(s): CKTOTAL, CKMB, CKMBINDEX, TROPONINI in the last 168 hours. BNP: Invalid input(s): POCBNP CBG: Recent Labs  Lab 12/18/18 1641 12/18/18 2220 12/19/18 0653 12/19/18 0736 12/19/18 1137  GLUCAP 156* 91 116* 108* 143*   D-Dimer No results for input(s): DDIMER in the last 72 hours. Hgb A1c No results for input(s): HGBA1C in  the last 72 hours. Lipid Profile No results for input(s): CHOL, HDL, LDLCALC, TRIG, CHOLHDL, LDLDIRECT in the last 72 hours. Thyroid function studies No results for input(s): TSH, T4TOTAL, T3FREE, THYROIDAB in the last 72 hours.  Invalid input(s): FREET3 Anemia work up No results for input(s): VITAMINB12, FOLATE, FERRITIN, TIBC, IRON, RETICCTPCT in the last 72 hours. Urinalysis    Component Value Date/Time   COLORURINE YELLOW 02/02/2017 1146   APPEARANCEUR CLEAR 02/02/2017 1146   LABSPEC 1.011 02/02/2017 1146   PHURINE 5.0 02/02/2017 1146   GLUCOSEU NEGATIVE 02/02/2017 1146   HGBUR NEGATIVE 02/02/2017 [place] 02/02/2017 1146   BILIRUBINUR neg 01/28/2016 [place] 02/02/2017 1146   PROTEINUR 100 (A) 02/02/2017 1146   UROBILINOGEN 0.2 01/28/2016 1159   NITRITE NEGATIVE 02/02/2017 1146   LEUKOCYTESUR NEGATIVE 02/02/2017 1146   Sepsis Labs Invalid input(s): PROCALCITONIN,  WBC,  LACTICIDVEN Microbiology No results found for this or any previous visit (from the past 240 hour(s)).  Please note: You were cared for by a hospitalist during your hospital stay. Once you are discharged, your primary care physician will handle any further medical issues. Please note that NO REFILLS for any discharge medications will be authorized once you are discharged, as it is imperative that you return to your primary care physician (or establish a relationship with a primary care physician if you do not have one) for your post hospital discharge needs so that they can reassess your need for medications and monitor your lab values.    Time coordinating discharge: 40 minutes  SIGNED:   Shelly Coss, MD  [facility] 12/19/2018, 11:39 AM Pager [phone]  If 7PM-7AM, please contact night-coverage [email or web address] Password [email or web address]

## 2018-12-19 NOTE — Progress Notes (Signed)
[place] for warfarin Indication: atrial fibrillation  Allergies  Allergen Reactions  . Hydrocodone Nausea And Vomiting    Other reaction(s): GI Upset (intolerance) Projectile vomiting  . Oxycodone Nausea And Vomiting    Other reaction(s): GI Upset (intolerance), Vomiting (intolerance) Projectile vomiting    Patient Measurements: Height: 5\' 9"  (175.3 cm) Weight: 234 lb 11.2 oz (106.5 kg)(scale a) IBW/kg (Calculated) : 70.7  Heparin dosing weight: 95 kg  Vital Signs: Temp: 97.6 F (36.4 C) (01/30 1143) Temp Source: Oral (01/30 1143) BP: 146/52 (01/30 1143) Pulse Rate: 68 (01/30 1143)  Labs: Recent Labs    12/17/18 0413 12/17/18 2215 12/18/18 0500 12/18/18 1233 12/19/18 0452 12/19/18 1112  HGB 12.2* 12.2*  --  12.9*  12.6* 12.8*  --   HCT 37.7* 37.0*  --  38.0*  37.0* 39.5  --   PLT 236 252  --   --  227  --   LABPROT 16.0*  --   --   --  15.9*  --   INR 1.29  --   --   --  1.28  --   HEPARINUNFRC  --   --   --   --  0.57 0.62  CREATININE 3.07* 3.26* 3.01*  --  2.34*  --     Estimated Creatinine Clearance: 33.8 mL/min (A) (by C-G formula based on SCr of 2.34 mg/dL (H)).   Medical History: Past Medical History:  Diagnosis Date  . Brittle bones    per pt, has soft bones in right foot/wears boot cast!  . CAD (coronary artery disease)   . Cataract    Bil/ surg scheduled for right eye 01/18/17/ left eye 02/08/17  . Charcot ankle, right 2019  . CHF (congestive heart failure) ([place]) 2015  . Diabetes mellitus   . Heart failure, diastolic ([place])   . Hyperlipidemia   . Hypertension   . Macular edema 2014  . OSA on CPAP   . Personal history of colonic polyps - adenomas 01/28/2014  . Renal insufficiency   . Shortness of breath dyspnea   . Syncope and collapse      Assessment: 73 yo male with atrial fibrillation. Dr. Tawanna Solo gave verbal orders to stop heparin bridge and give a dose of warfarin today then discharge home on  warfarin with clinic visit on Monday.  CBC has been stable. INR today remains sub-therapeutic at 1.28   Plan:  Give warfarin 5 mg po x 1 again today. Patient to continue on warfarin 5 mg po daily with INR check on Monday per discussion with Dr. Tawanna Solo Monitor daily INR, CBC, clinical course, s/sx of bleed, PO intake, DDI   Thank you for allowing [place] to participate in this patients care.   Sloan Leiter, PharmD, BCPS, BCCCP Clinical Pharmacist Please refer to [facility] for [place] numbers 12/19/2018 12:11 PM

## 2018-12-19 NOTE — Progress Notes (Signed)
Pt had a 2 second pause. MD notified.

## 2018-12-19 NOTE — Progress Notes (Signed)
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  He was history of hypertension hyperlipidemia diabetes mellitus coronary artery disease was admitted with volume overload and acute on chronic diastolic congestive heart failure.  Heart catheterization revealed 60% left main 50% circumflex 60% right coronary artery and recommendations for medical therapy.  He has paroxysmal atrial fibrillation remains in atrial fibrillation and treated with Coumadin.  Patient is anticipating renal transplantation with living donor.  Decision has been made not to proceed with permanent access placement.  He has a dialysis slot open at [facility].  He has a tunneled dialysis catheter placed 12/16/2018.  Blood pressure 160/80 pulse 65 temperature 97.5.  Urine output 700 cc.  He had 2000 cc removed with dialysis 12/18/2018.  Sodium 138 potassium 4.0 chloride 97 CO2 27 glucose 129 BUN 52 creatinine 2.34 calcium 9.1 WBC 9.4 hemoglobin 12.8 platelets 227 INR 1.28  Objective:  Vital signs in last 24 hours:  Temp:  [97.5 F (36.4 C)-98 F (36.7 C)] 97.5 F (36.4 C) (01/30 0541) Pulse Rate:  [57-79] 65 (01/30 0541) Resp:  [18] 18 (01/30 0541) BP: (146-177)/(66-94) 162/80 (01/30 0921) SpO2:  [93 %-98 %] 98 % (01/30 0541) Weight:  [106.5 kg-109 kg] 106.5 kg (01/30 0541)  Weight change: 0.046 kg Filed Weights   12/18/18 1831 12/18/18 2131 12/19/18 0541  Weight: 109 kg 107 kg 106.5 kg    Intake/Output: I/O last 3 completed shifts: In: 252.8 [P.O.:240; I.V.:12.8] Out: 3300 [Urine:1300; Other:2000]   Intake/Output this shift:  No intake/output data recorded. Wearing BiPAP this morning awake alert CVS-irregular rate and rhythm RS- CTA no wheezes or rales ABD- BS present soft non-distended EXT- no edema   Basic Metabolic Panel: Recent Labs  Lab 12/16/18 0424 12/17/18 0413 12/17/18 2215 12/18/18 0500  12/18/18 1233 12/19/18 0452  NA 133* 136 136 139 141  140 138  K 2.8* 3.2* 3.8 3.4* 3.4*  3.4* 4.0  CL 88* 93* 96* 98  --  98  CO2 26 28 28 28   --  27  GLUCOSE 101* 196* 172* 108*  --  129*  BUN 124* 86* 96* 92*  --  52*  CREATININE 3.98* 3.07* 3.26* 3.01*  --  2.34*  CALCIUM 9.1 9.0 9.2 9.7  --  9.1  MG 2.4 2.2  --   --   --   --   PHOS  --   --  3.8  --   --   --     Liver Function Tests: Recent Labs  Lab 12/17/18 2215  ALBUMIN 3.4*   No results for input(s): LIPASE, AMYLASE in the last 168 hours. No results for input(s): AMMONIA in the last 168 hours.  CBC: Recent Labs  Lab 12/16/18 0939 12/17/18 0413 12/17/18 2215 12/18/18 1233 12/19/18 0452  WBC 8.8 7.1 8.5  --  9.4  HGB 12.7* 12.2* 12.2* 12.9*  12.6* 12.8*  HCT 38.8* 37.7* 37.0* 38.0*  37.0* 39.5  MCV 90.7 91.3 90.9  --  91.4  PLT 288 236 252  --  227    Cardiac Enzymes: No results for input(s): CKTOTAL, CKMB, CKMBINDEX, TROPONINI in the last 168 hours.  BNP: Invalid input(s): POCBNP  CBG: Recent Labs  Lab 12/18/18 1123 12/18/18 1641 12/18/18 2220 12/19/18 0653 12/19/18 0736  GLUCAP 108* 156* 91 116* 108*    Microbiology: Results for orders placed or performed in visit on 10/10/16  Fungal stain  Status: None   Collection Time: 10/10/16 10:03 AM  Result Value Ref Range Status   Source: ;  Final   Fungus Smear    Final    Comment:   FUNGAL STAIN       MICRO NUMBER:      [id number]   TEST STATUS:       FINAL   SPECIMEN SOURCE:   NOT GIVEN   SPECIMEN QUALITY:  ADEQUATE   RESULT:            No fungal elements seen.     Coagulation Studies: Recent Labs    12/17/18 0413 12/19/18 0452  LABPROT 16.0* 15.9*  INR 1.29 1.28    Urinalysis: No results for input(s): COLORURINE, LABSPEC, PHURINE, GLUCOSEU, HGBUR, BILIRUBINUR, KETONESUR, PROTEINUR, UROBILINOGEN, NITRITE, LEUKOCYTESUR in the last 72 hours.  Invalid input(s): APPERANCEUR    Imaging: No results found.   Medications:    . sodium chloride    . heparin 1,450 Units/hr (12/18/18 2229)   . amLODipine  10 mg Oral Daily  . atorvastatin  80 mg Oral Daily  . Chlorhexidine Gluconate Cloth  6 each Topical Q0600  . Chlorhexidine Gluconate Cloth  6 each Topical Q0600  . Chlorhexidine Gluconate Cloth  6 each Topical Q0600  . cholecalciferol  5,000 Units Oral Daily  . gabapentin  300 mg Oral Daily  . hydrALAZINE  100 mg Oral TID  . insulin aspart  0-15 Units Subcutaneous TID WC  . insulin aspart  0-5 Units Subcutaneous QHS  . insulin aspart  4 Units Subcutaneous TID WC  . insulin NPH Human  35 Units Subcutaneous BID AC  . isosorbide mononitrate  20 mg Oral BID  . polyethylene glycol  17 g Oral Daily  . saccharomyces boulardii  250 mg Oral Daily  . sodium chloride flush  3 mL Intravenous Q12H  . sodium chloride flush  3 mL Intravenous Q12H  . Warfarin - Pharmacist Dosing Inpatient   Does not apply q1800   sodium chloride, acetaminophen, alteplase, cyclobenzaprine, heparin, lidocaine (PF), lidocaine-prilocaine, nitroGLYCERIN, ondansetron (ZOFRAN) IV, pentafluoroprop-tetrafluoroeth, sodium chloride flush, sodium chloride flush  Assessment/ Plan:   ESRD-new start.  [facility] [employer].  Patient with tunneled dialysis catheter placed 12/16/2018.  No permanent access planned at this present time as patient is to receive living donor transplant.  Dialysis shift Tuesday Thursday Saturday  Anemia does not appear to be an issue at this time  Secondary hyperparathyroidism does not appear to be a recent PTH calcium 9.2 phosphorus 3.8 no phosphorus binders at this time using oral cholecalciferol 5000 units daily  Coronary artery disease three-vessel disease with preserved ejection fraction is been evaluated by cardiology and not thought to be a candidate for CABG at this time.  Medical therapy recommended aspirin and statin  Paroxysmal atrial fibrillation amiodarone added anticoagulated with Coumadin  Diabetes mellitus  per primary team  Hypertension appears adequately controlled we will need to adjust antihypertensives as patient reaches  his dry weight.  Diabetic neuropathy continues gabapentin  Discussed with Freda Munro in dialysis.  Patient needs to be at outpatient center before 2 PM tomorrow to sign paperwork to start on Saturday.   LOS: [place] @TODAY @11 :02 AM

## 2018-12-19 NOTE — Progress Notes (Signed)
Accepted at [address] .1st treatment [id number] February 04,2020  Schedule and chair time is : Tuesday,Thursday,Saturday at 11:30am

## 2018-12-19 NOTE — Discharge Instructions (Signed)
Information on my medicine - Coumadin   (Warfarin)  This medication education was reviewed with me or my healthcare representative as part of my discharge preparation.  The pharmacist that spoke with me during my hospital stay was:  Brain Hilts, [facility]  Why was Coumadin prescribed for you? Coumadin was prescribed for you because you have a blood clot or a medical condition that can cause an increased risk of forming blood clots. Blood clots can cause serious health problems by blocking the flow of blood to the heart, lung, or brain. Coumadin can prevent harmful blood clots from forming. As a reminder your indication for Coumadin is:   Select from menu  What test will check on my response to Coumadin? While on Coumadin (warfarin) you will need to have an INR test regularly to ensure that your dose is keeping you in the desired range. The INR (international normalized ratio) number is calculated from the result of the laboratory test called prothrombin time (PT).  If an INR APPOINTMENT HAS NOT ALREADY BEEN MADE FOR YOU please schedule an appointment to have this lab work done by your health care provider within 7 days. Your INR goal is usually a number between:  2 to 3 or your provider may give you a more narrow range like 2-2.5.  Ask your health care provider during an office visit what your goal INR is.  What  do you need to  know  About  COUMADIN? Take Coumadin (warfarin) exactly as prescribed by your healthcare provider about the same time each day.  DO NOT stop taking without talking to the doctor who prescribed the medication.  Stopping without other blood clot prevention medication to take the place of Coumadin may increase your risk of developing a new clot or stroke.  Get refills before you run out.  What do you do if you miss a dose? If you miss a dose, take it as soon as you remember on the same day then continue your regularly scheduled regimen the next day.  Do not take two doses  of Coumadin at the same time.  Important Safety Information A possible side effect of Coumadin (Warfarin) is an increased risk of bleeding. You should call your healthcare provider right away if you experience any of the following: ? Bleeding from an injury or your nose that does not stop. ? Unusual colored urine (red or dark brown) or unusual colored stools (red or black). ? Unusual bruising for unknown reasons. ? A serious fall or if you hit your head (even if there is no bleeding).  Some foods or medicines interact with Coumadin (warfarin) and might alter your response to warfarin. To help avoid this: ? Eat a balanced diet, maintaining a consistent amount of Vitamin K. ? Notify your provider about major diet changes you plan to make. ? Avoid alcohol or limit your intake to 1 drink for women and 2 drinks for men per day. (1 drink is 5 oz. wine, 12 oz. beer, or 1.5 oz. liquor.)  Make sure that ANY health care provider who prescribes medication for you knows that you are taking Coumadin (warfarin).  Also make sure the healthcare provider who is monitoring your Coumadin knows when you have started a new medication including herbals and non-prescription products.  Coumadin (Warfarin)  Major Drug Interactions  Increased Warfarin Effect Decreased Warfarin Effect  Alcohol (large quantities) Antibiotics (esp. Septra/Bactrim, Flagyl, Cipro) Amiodarone (Cordarone) Aspirin (ASA) Cimetidine (Tagamet) Megestrol (Megace) NSAIDs (ibuprofen, naproxen, etc.) Piroxicam (  Feldene) °Propafenone (Rythmol SR) °Propranolol (Inderal) °Isoniazid (INH) °Posaconazole (Noxafil) Barbiturates (Phenobarbital) °Carbamazepine (Tegretol) °Chlordiazepoxide (Librium) °Cholestyramine (Questran) °Griseofulvin °Oral Contraceptives °Rifampin °Sucralfate (Carafate) °Vitamin K  ° °Coumadin® (Warfarin) Major Herbal Interactions  °Increased Warfarin Effect Decreased Warfarin Effect  °Garlic °Ginseng °Ginkgo biloba Coenzyme  Q10 °Green tea °St. John’s wort   ° °Coumadin® (Warfarin) FOOD Interactions  °Eat a consistent number of servings per week of foods HIGH in Vitamin K °(1 serving = ½ cup)  °Collards (cooked, or boiled & drained) °Kale (cooked, or boiled & drained) °Mustard greens (cooked, or boiled & drained) °Parsley *serving size only = ¼ cup °Spinach (cooked, or boiled & drained) °Swiss chard (cooked, or boiled & drained) °Turnip greens (cooked, or boiled & drained)  °Eat a consistent number of servings per week of foods MEDIUM-HIGH in Vitamin K °(1 serving = 1 cup)  °Asparagus (cooked, or boiled & drained) °Broccoli (cooked, boiled & drained, or raw & chopped) °Brussel sprouts (cooked, or boiled & drained) *serving size only = ½ cup °Lettuce, raw (green leaf, endive, romaine) °Spinach, raw °Turnip greens, raw & chopped  ° °These websites have more information on Coumadin (warfarin):  www.coumadin.com; °www.ahrq.gov/consumer/coumadin.htm; ° ° ° °

## 2018-12-19 NOTE — Progress Notes (Addendum)
RN unable to reach CM regarding if pt has coumadin clinic and outpatient dialysis set up. RN unable to reach on-call ED CM too. MD aware.

## 2018-12-19 NOTE — Consult Note (Signed)
            [facility] CM Primary Care Navigator  12/19/2018  KHALIEL MOREY 1946/05/10 [id number]   Martin Majestic to see patient at the bedside to identify possible discharge needs but he is off the unit in hemodialysis (HD) at the moment per staff.  Will attempt to see patient at another time for any THN-CM needs when available in the room.     AddendumMartin Majestic back to see patient at the bedside to identify possible discharge needs but he was already discharged per staff.  Per MD note, patient was admitted initially for shortness of breath, anasarca and was found to be in acute on chronic congestive HF andcardiorenal syndrome. Cardiology and nephrology consulted, was started on dialysis here. He also has a living donor for kidney transplant and being planned for kidney transplant at [facility]. Patient underwent cardiac catheterization for preparation for kidney transplant. Outpatient dialysis facility and schedule has been finalized for him ([facility]).  Patient has discharge instruction to follow-up with primary care provider on 12/23/18 and outpatient follow-up with cardiology and nephrology post discharge.    For additional questions please contact:  Edwena Felty A. Tykeisha Peer, BSN, RN-BC [facility] PRIMARY CARE Navigator Cell: [phone]

## 2018-12-19 NOTE — Progress Notes (Signed)
Rn went to d/c patient per order. Patient and family refusing d/c tonight. Patient's wife states she is unable to drive at night and has no one else to pick them up. Lovey Newcomer paged and notified. Primary RN made aware. Will continue to monitor patient.

## 2018-12-20 DIAGNOSIS — D631 Anemia in chronic kidney disease: Secondary | ICD-10-CM | POA: Insufficient documentation

## 2018-12-20 DIAGNOSIS — R52 Pain, unspecified: Secondary | ICD-10-CM | POA: Insufficient documentation

## 2018-12-20 DIAGNOSIS — N189 Chronic kidney disease, unspecified: Secondary | ICD-10-CM | POA: Insufficient documentation

## 2018-12-20 LAB — GLUCOSE, CAPILLARY: Glucose-Capillary: 100 mg/dL — ABNORMAL HIGH (ref 70–99)

## 2018-12-20 LAB — PROTIME-INR
INR: 1.27
PROTHROMBIN TIME: 15.7 s — AB (ref 11.4–15.2)

## 2018-12-20 MED ORDER — WARFARIN SODIUM 7.5 MG PO TABS
7.5000 mg | ORAL_TABLET | Freq: Once | ORAL | Status: AC
  Administered 2018-12-20: 7.5 mg via ORAL
  Filled 2018-12-20: qty 1

## 2018-12-20 NOTE — Progress Notes (Signed)
Patient was discharge home. Information regarding dialysis was clarified and pharmacist spoke to family regarding warfarin medication indications.  Patient will be going to [facility] for Dialysis.  discharge packet was given with instructions education given with teach back.  Peripheral IV was removed, clean dry and intact, pressure and dressing applied. Patient and family questions and concerns were answered. No further questions.  Nurse tech transported patient in wheelchair to car, family member with patient.

## 2018-12-20 NOTE — Progress Notes (Signed)
[place] for warfarin Indication: atrial fibrillation  Allergies  Allergen Reactions  . Hydrocodone Nausea And Vomiting    Other reaction(s): GI Upset (intolerance) Projectile vomiting  . Oxycodone Nausea And Vomiting    Other reaction(s): GI Upset (intolerance), Vomiting (intolerance) Projectile vomiting    Patient Measurements: Height: 5\' 9"  (175.3 cm) Weight: 230 lb 11.2 oz (104.6 kg) IBW/kg (Calculated) : 70.7   Vital Signs: Temp: 97.9 F (36.6 C) (01/31 0625) Temp Source: Oral (01/31 0625) BP: 163/74 (01/31 0625) Pulse Rate: 53 (01/31 0625)  Labs: Recent Labs    12/17/18 2215 12/18/18 0500 12/18/18 1233 12/19/18 0452 12/19/18 1112 12/20/18 0601  HGB 12.2*  --  12.9*  12.6* 12.8*  --   --   HCT 37.0*  --  38.0*  37.0* 39.5  --   --   PLT 252  --   --  227  --   --   LABPROT  --   --   --  15.9*  --  15.7*  INR  --   --   --  1.28  --  1.27  HEPARINUNFRC  --   --   --  0.57 0.62  --   CREATININE 3.26* 3.01*  --  2.34*  --   --     Estimated Creatinine Clearance: 33.5 mL/min (A) (by C-G formula based on SCr of 2.34 mg/dL (H)).   Medical History: Past Medical History:  Diagnosis Date  . Brittle bones    per pt, has soft bones in right foot/wears boot cast!  . CAD (coronary artery disease)   . Cataract    Bil/ surg scheduled for right eye 01/18/17/ left eye 02/08/17  . Charcot ankle, right 2019  . CHF (congestive heart failure) ([place]) 2015  . Diabetes mellitus   . Heart failure, diastolic ([place])   . Hyperlipidemia   . Hypertension   . Macular edema 2014  . OSA on CPAP   . Personal history of colonic polyps - adenomas 01/28/2014  . Renal insufficiency   . Shortness of breath dyspnea   . Syncope and collapse      Assessment: 73 yo Louis [name] with atrial fibrillation. Dr. Tawanna Solo gave verbal orders to stop heparin bridge and give a dose of warfarin 1/31 then discharge home on warfarin with clinic visit on Monday.  Patient and family refused discharge due to not being able to drive at night.  CBC has been stable. INR today remains sub-therapeutic at 1.28 >>1.27 after 2 nights of 5mg .    Plan:  Give warfarin 7.5 mg po x 1 today due unchanged INR. Patient to continue on warfarin 5 mg po daily outpatient with INR check on Monday per discussion with Dr. Tawanna Solo Monitor daily INR, CBC, clinical course, s/sx of bleed, PO intake, DDI   Thank you for allowing [place] to participate in this patients care.   Sloan Leiter, PharmD, BCPS, BCCCP Clinical Pharmacist Please refer to [facility] for [place] numbers 12/20/2018 8:59 AM

## 2018-12-20 NOTE — Progress Notes (Signed)
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  He was history of hypertension hyperlipidemia diabetes mellitus coronary artery disease was admitted with volume overload and acute on chronic diastolic congestive heart failure.  Heart catheterization revealed 60% left main 50% circumflex 60% right coronary artery and recommendations for medical therapy.  He has paroxysmal atrial fibrillation remains in atrial fibrillation and treated with Coumadin.  Patient is anticipating renal transplantation with living donor.  Decision has been made not to proceed with permanent access placement.  He has a dialysis slot open at [facility] dialysis to start tomorrow.  He knows he needs to sign paperwork at the [facility] HD unit today before 2 pm.  He has a tunneled dialysis catheter placed 12/16/2018.   Blood pressure 160/80 pulse 65 temperature 97.5  He had 2000 cc removed with dialysis 12/18/2018.    Objective:  Vital signs in last 24 hours:  Temp:  [97.5 F (36.4 C)-98.4 F (36.9 C)] 97.9 F (36.6 C) (01/31 0625) Pulse Rate:  [53-99] 74 (01/31 0918) Resp:  [17-20] 18 (01/30 1930) BP: (110-172)/(43-91) 172/91 (01/31 0918) SpO2:  [96 %-100 %] 99 % (01/31 0918) Weight:  [104.6 kg-107.1 kg] 104.6 kg (01/31 0300)  Weight change: -1.9 kg Filed Weights   12/19/18 1335 12/19/18 1740 12/20/18 0300  Weight: 107.1 kg 105.2 kg 104.6 kg    Intake/Output: I/O last 3 completed shifts: In: 343 [P.O.:340; I.V.:3] Out: 4401 [Urine:400; Other:4000; Stool:1]   Intake/Output this shift:  No intake/output data recorded. No distress alert CVS-irregular rate and rhythm RS- CTA no wheezes or rales ABD- BS present soft non-distended EXT- no edema   Basic Metabolic Panel: Recent Labs  Lab 12/16/18 0424 12/17/18 0413 12/17/18 2215 12/18/18 0500 12/18/18 1233 12/19/18 0452  NA 133* 136 136 139 141  140 138  K 2.8*  3.2* 3.8 3.4* 3.4*  3.4* 4.0  CL 88* 93* 96* 98  --  98  CO2 26 28 28 28   --  27  GLUCOSE 101* 196* 172* 108*  --  129*  BUN 124* 86* 96* 92*  --  52*  CREATININE 3.98* 3.07* 3.26* 3.01*  --  2.34*  CALCIUM 9.1 9.0 9.2 9.7  --  9.1  MG 2.4 2.2  --   --   --   --   PHOS  --   --  3.8  --   --   --     Liver Function Tests: Recent Labs  Lab 12/17/18 2215  ALBUMIN 3.4*   No results for input(s): LIPASE, AMYLASE in the last 168 hours. No results for input(s): AMMONIA in the last 168 hours.  CBC: Recent Labs  Lab 12/16/18 0939 12/17/18 0413 12/17/18 2215 12/18/18 1233 12/19/18 0452  WBC 8.8 7.1 8.5  --  9.4  HGB 12.7* 12.2* 12.2* 12.9*  12.6* 12.8*  HCT 38.8* 37.7* 37.0* 38.0*  37.0* 39.5  MCV 90.7 91.3 90.9  --  91.4  PLT 288 236 252  --  227    Cardiac Enzymes: No results for input(s): CKTOTAL, CKMB, CKMBINDEX, TROPONINI in the last 168 hours.  BNP: Invalid input(s): POCBNP  CBG: Recent Labs  Lab 12/19/18 0736 12/19/18 1137 12/19/18 1819 12/19/18 2047 12/20/18 0741  GLUCAP 108* 143* 81 181* 100*    Microbiology: Results for orders placed or performed in visit on 10/10/16  Fungal stain     Status: None  Collection Time: 10/10/16 10:03 AM  Result Value Ref Range Status   Source: ;  Final   Fungus Smear    Final    Comment:   FUNGAL STAIN       MICRO NUMBER:      [id number]   TEST STATUS:       FINAL   SPECIMEN SOURCE:   NOT GIVEN   SPECIMEN QUALITY:  ADEQUATE   RESULT:            No fungal elements seen.     Coagulation Studies: Recent Labs    12/19/18 0452 12/20/18 0601  LABPROT 15.9* 15.7*  INR 1.28 1.27    Urinalysis: No results for input(s): COLORURINE, LABSPEC, PHURINE, GLUCOSEU, HGBUR, BILIRUBINUR, KETONESUR, PROTEINUR, UROBILINOGEN, NITRITE, LEUKOCYTESUR in the last 72 hours.  Invalid input(s): APPERANCEUR    Imaging: No results found.   Medications:   . sodium chloride     . amLODipine  10 mg Oral Daily  . atorvastatin   80 mg Oral Daily  . Chlorhexidine Gluconate Cloth  6 each Topical Q0600  . Chlorhexidine Gluconate Cloth  6 each Topical Q0600  . Chlorhexidine Gluconate Cloth  6 each Topical Q0600  . cholecalciferol  5,000 Units Oral Daily  . gabapentin  300 mg Oral Daily  . hydrALAZINE  100 mg Oral TID  . insulin aspart  0-15 Units Subcutaneous TID WC  . insulin aspart  0-5 Units Subcutaneous QHS  . insulin aspart  4 Units Subcutaneous TID WC  . insulin NPH Human  35 Units Subcutaneous BID AC  . isosorbide mononitrate  20 mg Oral BID  . polyethylene glycol  17 g Oral Daily  . saccharomyces boulardii  250 mg Oral Daily  . sodium chloride flush  3 mL Intravenous Q12H  . sodium chloride flush  3 mL Intravenous Q12H  . Warfarin - Pharmacist Dosing Inpatient   Does not apply q1800   sodium chloride, acetaminophen, alteplase, cyclobenzaprine, heparin, lidocaine (PF), lidocaine-prilocaine, nitroGLYCERIN, ondansetron (ZOFRAN) IV, pentafluoroprop-tetrafluoroeth, sodium chloride flush, sodium chloride flush  Assessment/ Plan:   ESRD-new start.  [facility] [employer].  Patient with tunneled dialysis catheter placed 12/16/2018.  No permanent access planned at this present time as patient is to receive living donor transplant.  Dialysis shift Tuesday Thursday Saturday at [facility].  Pt to dc this am and will start at OP HD unit tomorrow on TTS schedule.  He knows to go to the same unit today before 2pm to sign paperwork.   Anemia does not appear to be an issue at this time  Secondary hyperparathyroidism does not appear to be a recent PTH calcium 9.2 phosphorus 3.8 no phosphorus binders at this time using oral cholecalciferol 5000 units daily  Coronary artery disease three-vessel disease with preserved ejection fraction is been evaluated by cardiology and not thought to be a candidate for CABG at this time.  Medical therapy recommended aspirin and statin  Paroxysmal atrial fibrillation amiodarone added  anticoagulated with Coumadin  Diabetes mellitus per primary team  Hypertension appears adequately controlled we will need to adjust antihypertensives as patient reaches  his dry weight.  Diabetic neuropathy continues gabapentin    LOS: 6 Tonye Tancredi D Hyland Mollen[place] @TODAY @9 :48 AM

## 2018-12-20 NOTE — Progress Notes (Addendum)
Patient could not be discharged yesterday because he was not aware of the information about the outpatient dialysis facility. Outpatient dialysis facility information provided by nephrology today. He will be given warfarin 7.5 mg today. Patient remains comfortable, hemodynamically stable.  No new issues. Stable for discharge today to home. He will follow-up with his PCP on Monday for checking INR.

## 2018-12-21 DIAGNOSIS — E1122 Type 2 diabetes mellitus with diabetic chronic kidney disease: Secondary | ICD-10-CM | POA: Diagnosis not present

## 2018-12-21 DIAGNOSIS — N186 End stage renal disease: Secondary | ICD-10-CM | POA: Diagnosis not present

## 2018-12-21 DIAGNOSIS — Z992 Dependence on renal dialysis: Secondary | ICD-10-CM | POA: Diagnosis not present

## 2018-12-23 ENCOUNTER — Ambulatory Visit: Payer: Self-pay | Admitting: Family Medicine

## 2018-12-23 ENCOUNTER — Ambulatory Visit (INDEPENDENT_AMBULATORY_CARE_PROVIDER_SITE_OTHER): Payer: Medicare Other | Admitting: Family Medicine

## 2018-12-23 ENCOUNTER — Encounter: Payer: Self-pay | Admitting: Family Medicine

## 2018-12-23 VITALS — BP 148/64 | HR 59 | Ht 69.0 in | Wt 239.0 lb

## 2018-12-23 DIAGNOSIS — D631 Anemia in chronic kidney disease: Secondary | ICD-10-CM | POA: Diagnosis not present

## 2018-12-23 DIAGNOSIS — E7800 Pure hypercholesterolemia, unspecified: Secondary | ICD-10-CM | POA: Diagnosis not present

## 2018-12-23 DIAGNOSIS — I5031 Acute diastolic (congestive) heart failure: Secondary | ICD-10-CM | POA: Diagnosis not present

## 2018-12-23 DIAGNOSIS — M1A9XX1 Chronic gout, unspecified, with tophus (tophi): Secondary | ICD-10-CM

## 2018-12-23 DIAGNOSIS — N184 Chronic kidney disease, stage 4 (severe): Secondary | ICD-10-CM

## 2018-12-23 DIAGNOSIS — N179 Acute kidney failure, unspecified: Secondary | ICD-10-CM

## 2018-12-23 DIAGNOSIS — I4821 Permanent atrial fibrillation: Secondary | ICD-10-CM

## 2018-12-23 DIAGNOSIS — D688 Other specified coagulation defects: Secondary | ICD-10-CM | POA: Insufficient documentation

## 2018-12-23 DIAGNOSIS — I4891 Unspecified atrial fibrillation: Secondary | ICD-10-CM | POA: Insufficient documentation

## 2018-12-23 DIAGNOSIS — I5032 Chronic diastolic (congestive) heart failure: Secondary | ICD-10-CM | POA: Diagnosis not present

## 2018-12-23 DIAGNOSIS — M1000 Idiopathic gout, unspecified site: Secondary | ICD-10-CM | POA: Diagnosis not present

## 2018-12-23 DIAGNOSIS — T82599A Other mechanical complication of unspecified cardiac and vascular devices and implants, initial encounter: Secondary | ICD-10-CM | POA: Insufficient documentation

## 2018-12-23 DIAGNOSIS — E11610 Type 2 diabetes mellitus with diabetic neuropathic arthropathy: Secondary | ICD-10-CM | POA: Diagnosis not present

## 2018-12-23 DIAGNOSIS — R601 Generalized edema: Secondary | ICD-10-CM

## 2018-12-23 DIAGNOSIS — I10 Essential (primary) hypertension: Secondary | ICD-10-CM | POA: Diagnosis not present

## 2018-12-23 DIAGNOSIS — M10031 Idiopathic gout, right wrist: Secondary | ICD-10-CM

## 2018-12-23 DIAGNOSIS — E1142 Type 2 diabetes mellitus with diabetic polyneuropathy: Secondary | ICD-10-CM

## 2018-12-23 DIAGNOSIS — N186 End stage renal disease: Secondary | ICD-10-CM | POA: Diagnosis not present

## 2018-12-23 DIAGNOSIS — E11311 Type 2 diabetes mellitus with unspecified diabetic retinopathy with macular edema: Secondary | ICD-10-CM

## 2018-12-23 DIAGNOSIS — E1122 Type 2 diabetes mellitus with diabetic chronic kidney disease: Secondary | ICD-10-CM | POA: Diagnosis not present

## 2018-12-23 LAB — POCT INR: INR: 3.3 — AB (ref 2.0–3.0)

## 2018-12-23 MED ORDER — ALLOPURINOL 100 MG PO TABS: 150.0000 mg | ORAL_TABLET | Freq: Two times a day (BID) | ORAL | 1 refills | Status: DC

## 2018-12-23 NOTE — Assessment & Plan Note (Signed)
Continue allopurinol 

## 2018-12-23 NOTE — Assessment & Plan Note (Signed)
Back to baseline. Limiting fluids to 32 oz.SOB resolved.

## 2018-12-23 NOTE — Assessment & Plan Note (Signed)
Stable No changes 

## 2018-12-23 NOTE — Assessment & Plan Note (Signed)
Has improved 

## 2018-12-23 NOTE — Assessment & Plan Note (Signed)
BP mildly elevated today ad he is limiting his fluids. Has dialysis today.

## 2018-12-23 NOTE — Patient Instructions (Signed)
Description   Take 1/2 tab on Tuesdays and a whole tab all other days of the week.  Recheck level in 8 days.

## 2018-12-23 NOTE — Assessment & Plan Note (Signed)
Follows with Ophthalmology. DM well controlled.

## 2018-12-23 NOTE — Progress Notes (Signed)
Established Patient Office Visit  Subjective:  Patient ID: Louis [name], male    DOB: Apr 01, 1946  Age: 73 y.o. MRN: [id number]  CC:  Chief Complaint  Patient presents with  . Hospitalization Follow-up    HPI Louis [name] presents for Hospital Follow up.  Louis [name] is a 73 year old male with a past medical history of CKD 4, coronary artery disease, diabetes with Charcot foot, congestive heart failure with preserved EF, hypertension, sleep apnea on CPAP, diabetic peripheral neuropathy who was recently admitted to [facility] health on January 25 and discharged home on January 30.  He went in with increased shortness of breath had actually been seen in our office a couple days prior.  We have tried to increase his diuretics but unfortunately he did not respond.  Upon admission he was found to be in acute on chronic congestive heart failure.  He is currently on the transplant list for kidney transplant at [facility].  He did undergo heart catheterization while at [facility] health in preparation for kidney transplant.  He was placed on dialysis during admission.  His Eliquis was held and he was switched to Coumadin.    Being home he has felt better overall.  He actually had a low blood sugar around 51 this morning.  He denies any shortness of breath and says that has improved significantly he is just upset that he is now having to do dialysis.  Actually removed a most 22 pounds of fluid from him during his hospitalization.  Past Medical History:  Diagnosis Date  . Brittle bones    per pt, has soft bones in right foot/wears boot cast!  . CAD (coronary artery disease)   . Cataract    Bil/ surg scheduled for right eye 01/18/17/ left eye 02/08/17  . Charcot ankle, right 2019  . CHF (congestive heart failure) ([place]) 2015  . Diabetes mellitus   . Heart failure, diastolic ([place])   . Hyperlipidemia   . Hypertension   . Macular edema 2014  . OSA on CPAP   . Personal history of colonic polyps - adenomas  01/28/2014  . Renal insufficiency   . Shortness of breath dyspnea   . Syncope and collapse     Past Surgical History:  Procedure Laterality Date  . CARPAL TUNNEL RELEASE     left hand  . COLONOSCOPY    . INTRAVASCULAR PRESSURE WIRE/FFR STUDY N/A 12/18/2018   Procedure: INTRAVASCULAR PRESSURE WIRE/FFR STUDY;  Surgeon: Wellington Hampshire, MD;  Location: [place] [facility];  Service: Cardiovascular;  Laterality: N/A;  . IR FLUORO GUIDE CV LINE RIGHT  12/16/2018  . IR US GUIDE VASC ACCESS RIGHT  12/16/2018  . KNEE ARTHROSCOPY Right 09/13/2016   [facility], Dr. Dorna Leitz  . PILONIDAL CYST EXCISION    . RIGHT/LEFT HEART CATH AND CORONARY ANGIOGRAPHY N/A 12/18/2018   Procedure: RIGHT/LEFT HEART CATH AND CORONARY ANGIOGRAPHY;  Surgeon: Wellington Hampshire, MD;  Location: [place] [facility];  Service: Cardiovascular;  Laterality: N/A;    Family History  Problem Relation Age of Onset  . Diabetes Mother   . Kidney disease Mother   . Diabetes Father   . Coronary artery disease Father   . Diabetes Brother   . Diabetes Sister   . Cancer Maternal Uncle   . Diabetes Maternal Grandmother   . Cancer Paternal Grandmother   . Heart attack Paternal Grandfather   . Colon cancer Neg Hx     Social History   Socioeconomic  History  . Marital status: Married    Spouse name: Louis [name]  . Number of children: 5  . Years of education: 38  . Highest education level: 12th grade  Occupational History  . Occupation: Warehouse    Comment: retired  Scientific laboratory technician  . Financial resource strain: Not hard at all  . Food insecurity:    Worry: Never true    Inability: Never true  . Transportation needs:    Medical: No    Non-medical: No  Tobacco Use  . Smoking status: Former Smoker    Packs/day: 0.50    Years: 20.00    Pack years: 10.00    Types: Cigarettes    Last attempt to quit: 03/06/1977    Years since quitting: 41.8  . Smokeless tobacco: Never Used  Substance and Sexual Activity  . Alcohol  use: No  . Drug use: No  . Sexual activity: Not Currently  Lifestyle  . Physical activity:    Days per week: 0 days    Minutes per session: 0 min  . Stress: Not at all  Relationships  . Social connections:    Talks on phone: Twice a week    Gets together: Twice a week    Attends religious service: More than 4 times per year    Active member of club or organization: No    Attends meetings of clubs or organizations: Never    Relationship status: Married  . Intimate partner violence:    Fear of current or ex partner: No    Emotionally abused: No    Physically abused: No    Forced sexual activity: No  Other Topics Concern  . Not on file  Social History Narrative   Drinks a cup of coffee daily. Drinks a lot of water daily. Goes out with church members during the week    Outpatient Medications Prior to Visit  Medication Sig Dispense Refill  . allopurinol (ZYLOPRIM) 100 MG tablet TAKE 3 TABLETS BY MOUTH 2 TIMES A DAY (Patient taking differently: Take 150 mg by mouth 2 (two) times daily. ) 540 tablet 1  . AMBULATORY NON FORMULARY MEDICATION Rollator walker with seat. Dx: M25.561, M54.12, M10.00 1 each 0  . AMBULATORY NON FORMULARY MEDICATION Continuous positive airway pressure (CPAP) device: Auto titrate minimum 4 cm H20 to maximum of 20 cm H2O with pressure. Please provide all supplemental supplies as needed. Fax to: [phone] 1 Units 1  . amLODipine (NORVASC) 10 MG tablet Take 1 tablet (10 mg total) by mouth at bedtime. (Patient taking differently: Take 10 mg by mouth daily. ) 90 tablet 3  . atorvastatin (LIPITOR) 80 MG tablet Take 1 tablet (80 mg total) by mouth daily. 90 tablet 3  . B-D INS SYR ULTRAFINE 1CC/31G 31G X 5/16" 1 ML MISC     . Cholecalciferol (VITAMIN D3) 5000 units CAPS Take 5,000 Units by mouth daily.    . Coenzyme Q10 (CO Q 10 PO) Take 1 tablet by mouth daily.    Marland Kitchen gabapentin (NEURONTIN) 600 MG tablet Take 0.5 tablets (300 mg total) by mouth daily. 30 tablet 0  .  hydrALAZINE (APRESOLINE) 100 MG tablet Take 1 tablet (100 mg total) by mouth 3 (three) times daily. 270 tablet 1  . isosorbide mononitrate (ISMO,MONOKET) 20 MG tablet TAKE 1 TABLET (20 MG TOTAL) BY MOUTH 2 (TWO) TIMES DAILY AT 10 AM AND 5 PM. 180 tablet 2  . nitroGLYCERIN (NITROSTAT) 0.4 MG SL tablet Place 1 tablet (0.4 mg total) under  the tongue every 5 (five) minutes as needed for chest pain. 25 tablet 0  . NOVOLIN N 100 UNIT/ML injection Inject 35 Units into the skin 2 (two) times daily before a meal.     . NOVOLIN R 100 UNIT/ML injection Inject 15-20 Units into the skin 3 (three) times daily with meals.   5  . ONE TOUCH ULTRA TEST test strip USE TO CHECK BLOOD SUGAR 4 TIMES A DAY DX E11.65  1  . Red Yeast Rice Extract (RED YEAST RICE PO) Take 1 tablet by mouth 2 (two) times daily.    Marland Kitchen saccharomyces boulardii (FLORASTOR) 250 MG capsule Take 250 mg by mouth daily.    Marland Kitchen warfarin (COUMADIN) 5 MG tablet Take 1 tablet (5 mg total) by mouth daily at 6 PM for 30 days. Monitor INR. INR goal should be between 2-3. 30 tablet 0  . warfarin (COUMADIN) tablet 5 mg      No facility-administered medications prior to visit.     Allergies  Allergen Reactions  . Hydrocodone Nausea And Vomiting    Other reaction(s): GI Upset (intolerance) Projectile vomiting  . Oxycodone Nausea And Vomiting    Other reaction(s): GI Upset (intolerance), Vomiting (intolerance) Projectile vomiting    ROS Review of Systems    Objective:    Physical Exam  BP (!) 148/64   Pulse (!) 59   Ht 5\' 9"  (1.753 m)   Wt 239 lb (108.4 kg) Comment: pt was weighed at home w/o cothing 229lb  SpO2 98%   BMI 35.29 kg/m  Wt Readings from Last 3 Encounters:  12/23/18 239 lb (108.4 kg)  12/20/18 230 lb 11.2 oz (104.6 kg)  12/12/18 258 lb (117 kg)     There are no preventive care reminders to display for this patient.  There are no preventive care reminders to display for this patient.  Lab Results  Component Value Date    TSH 2.391 02/02/2017   Lab Results  Component Value Date   WBC 9.4 12/19/2018   HGB 12.8 (L) 12/19/2018   HCT 39.5 12/19/2018   MCV 91.4 12/19/2018   PLT 227 12/19/2018   Lab Results  Component Value Date   NA 138 12/19/2018   K 4.0 12/19/2018   CO2 27 12/19/2018   GLUCOSE 129 (H) 12/19/2018   BUN 52 (H) 12/19/2018   CREATININE 2.34 (H) 12/19/2018   BILITOT 1.5 (H) 12/05/2018   ALKPHOS 98 11/27/2017   AST 18 12/05/2018   ALT 13 12/05/2018   PROT 6.5 12/05/2018   ALBUMIN 3.4 (L) 12/17/2018   CALCIUM 9.1 12/19/2018   ANIONGAP 13 12/19/2018   GFR 38.90 (L) 03/10/2011   Lab Results  Component Value Date   CHOL 99 12/05/2018   Lab Results  Component Value Date   HDL 29 (L) 12/05/2018   Lab Results  Component Value Date   LDLCALC 51 12/05/2018   Lab Results  Component Value Date   TRIG 102 12/05/2018   Lab Results  Component Value Date   CHOLHDL 3.4 12/05/2018   Lab Results  Component Value Date   HGBA1C 5.7 (A) 12/12/2018      Assessment & Plan:   Problem List Items Addressed This Visit      Cardiovascular and Mediastinum   HYPERTENSION, BENIGN SYSTEMIC    BP mildly elevated today ad he is limiting his fluids. Has dialysis today.       Heart failure, diastolic (HCC)    Back to baseline. Limiting fluids to  32 oz.SOB resolved.       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 No changes      Macular edema, diabetic ([place])    Follows with Ophthalmology. DM well controlled.       Diabetic peripheral neuropathy (HCC)   Diabetes mellitus with stage 4 chronic kidney disease ([place])    Now on dialysis. Will go up to the transplant team for review in 2 weeks.         Other   Hyperlipidemia    Continue statin daily.       Gout    Continue allopurinol      Anasarca    Has improved.          Other Visit Diagnoses    Acute kidney injury ([place])          No orders of the defined types were placed in this encounter.   Follow-up: No follow-ups on file.    [name], MD

## 2018-12-23 NOTE — Assessment & Plan Note (Signed)
Now on dialysis. Will go up to the transplant team for review in 2 weeks.

## 2018-12-23 NOTE — Assessment & Plan Note (Signed)
Continue statin daily.  

## 2018-12-25 DIAGNOSIS — D631 Anemia in chronic kidney disease: Secondary | ICD-10-CM | POA: Diagnosis not present

## 2018-12-25 DIAGNOSIS — N186 End stage renal disease: Secondary | ICD-10-CM | POA: Diagnosis not present

## 2018-12-25 DIAGNOSIS — E1122 Type 2 diabetes mellitus with diabetic chronic kidney disease: Secondary | ICD-10-CM | POA: Diagnosis not present

## 2018-12-27 DIAGNOSIS — E1122 Type 2 diabetes mellitus with diabetic chronic kidney disease: Secondary | ICD-10-CM | POA: Diagnosis not present

## 2018-12-27 DIAGNOSIS — N186 End stage renal disease: Secondary | ICD-10-CM | POA: Diagnosis not present

## 2018-12-27 DIAGNOSIS — D631 Anemia in chronic kidney disease: Secondary | ICD-10-CM | POA: Diagnosis not present

## 2018-12-30 DIAGNOSIS — N186 End stage renal disease: Secondary | ICD-10-CM | POA: Diagnosis not present

## 2018-12-30 DIAGNOSIS — E1122 Type 2 diabetes mellitus with diabetic chronic kidney disease: Secondary | ICD-10-CM | POA: Diagnosis not present

## 2018-12-30 DIAGNOSIS — D631 Anemia in chronic kidney disease: Secondary | ICD-10-CM | POA: Diagnosis not present

## 2018-12-31 ENCOUNTER — Other Ambulatory Visit: Payer: Self-pay | Admitting: *Deleted

## 2018-12-31 ENCOUNTER — Ambulatory Visit (INDEPENDENT_AMBULATORY_CARE_PROVIDER_SITE_OTHER): Payer: Medicare Other | Admitting: Family Medicine

## 2018-12-31 DIAGNOSIS — I4821 Permanent atrial fibrillation: Secondary | ICD-10-CM | POA: Diagnosis not present

## 2018-12-31 DIAGNOSIS — N184 Chronic kidney disease, stage 4 (severe): Principal | ICD-10-CM

## 2018-12-31 DIAGNOSIS — E1122 Type 2 diabetes mellitus with diabetic chronic kidney disease: Secondary | ICD-10-CM

## 2018-12-31 LAB — POCT INR: INR: 2.8 (ref 2.0–3.0)

## 2018-12-31 MED ORDER — ONETOUCH ULTRA BLUE VI STRP: ORAL_STRIP | 11 refills | Status: DC

## 2018-12-31 NOTE — Progress Notes (Signed)
Pt in today for INR check. Pt reports taking  2.5 mg on Tuesday and 5 mg all other days.  Pt reports no change in diet, no bleeding or bruising, or SOB. Todays INR was 2.8 . Pt advised to continue with current dose and to return in 1 week.

## 2018-12-31 NOTE — Progress Notes (Signed)
INR at goal today so plan to recheck in 2 weeks.  If still within the goal at that point then the plan will be for [place] to go to 3-week f/U.

## 2019-01-01 DIAGNOSIS — N186 End stage renal disease: Secondary | ICD-10-CM | POA: Diagnosis not present

## 2019-01-01 DIAGNOSIS — D631 Anemia in chronic kidney disease: Secondary | ICD-10-CM | POA: Diagnosis not present

## 2019-01-01 DIAGNOSIS — E1122 Type 2 diabetes mellitus with diabetic chronic kidney disease: Secondary | ICD-10-CM | POA: Diagnosis not present

## 2019-01-02 DIAGNOSIS — E113213 Type 2 diabetes mellitus with mild nonproliferative diabetic retinopathy with macular edema, bilateral: Secondary | ICD-10-CM | POA: Diagnosis not present

## 2019-01-03 DIAGNOSIS — N186 End stage renal disease: Secondary | ICD-10-CM | POA: Diagnosis not present

## 2019-01-03 DIAGNOSIS — E1122 Type 2 diabetes mellitus with diabetic chronic kidney disease: Secondary | ICD-10-CM | POA: Diagnosis not present

## 2019-01-03 DIAGNOSIS — D631 Anemia in chronic kidney disease: Secondary | ICD-10-CM | POA: Diagnosis not present

## 2019-01-06 DIAGNOSIS — D631 Anemia in chronic kidney disease: Secondary | ICD-10-CM | POA: Diagnosis not present

## 2019-01-06 DIAGNOSIS — E1122 Type 2 diabetes mellitus with diabetic chronic kidney disease: Secondary | ICD-10-CM | POA: Diagnosis not present

## 2019-01-06 DIAGNOSIS — N186 End stage renal disease: Secondary | ICD-10-CM | POA: Diagnosis not present

## 2019-01-07 ENCOUNTER — Ambulatory Visit: Payer: Medicare Other | Admitting: Physician Assistant

## 2019-01-07 ENCOUNTER — Ambulatory Visit: Payer: Medicare Other | Admitting: Family Medicine

## 2019-01-07 ENCOUNTER — Telehealth: Payer: Self-pay | Admitting: Cardiology

## 2019-01-07 ENCOUNTER — Ambulatory Visit (INDEPENDENT_AMBULATORY_CARE_PROVIDER_SITE_OTHER): Payer: Medicare Other | Admitting: Family Medicine

## 2019-01-07 ENCOUNTER — Encounter: Payer: Self-pay | Admitting: Family Medicine

## 2019-01-07 VITALS — BP 125/58 | HR 58 | Temp 97.6°F | Ht 69.0 in | Wt 226.0 lb

## 2019-01-07 DIAGNOSIS — I4821 Permanent atrial fibrillation: Secondary | ICD-10-CM

## 2019-01-07 DIAGNOSIS — S31801A Laceration without foreign body of unspecified buttock, initial encounter: Secondary | ICD-10-CM

## 2019-01-07 LAB — POCT INR: INR: 2.8 (ref 2.0–3.0)

## 2019-01-07 MED ORDER — MUPIROCIN 2 % EX OINT: TOPICAL_OINTMENT | Freq: Two times a day (BID) | CUTANEOUS | 0 refills | Status: DC

## 2019-01-07 NOTE — Telephone Encounter (Signed)
Wife is calling to speak with Louis [name] to get advice for Louis [name] regarding his upcoming transplant

## 2019-01-07 NOTE — Telephone Encounter (Signed)
Spoke with pt wife, the transplant team got the cath results and they want to make sure the patient is still a good candidate for the transplant. When they look over the cath results they feel like he may need a stent. They are needing to discuss the cath with a physician. Dr Stanford Breed is on vacation and they need to talk with someone before Friday. Will forward to dr Fletcher Anon to see if he will call since he did the cath. Need to call Alinda Dooms- case coordinator- @ [phone].

## 2019-01-07 NOTE — Progress Notes (Signed)
See flow sheet

## 2019-01-07 NOTE — Progress Notes (Signed)
Acute Office Visit  Subjective:    Patient ID: Louis [name], male    DOB: June 10, 1946, 73 y.o.   MRN: [id number]  Chief Complaint  Patient presents with  . Pressure Ulcer    HPI Patient is in today for skin tear of buttock.  Initially had an appointment for a Coumadin check and discussed with the nurse that he felt like he might be getting a pressure ulcer on the buttock area.  He said when he was in his 57s he got a large swollen painful area when he went to the doctor and was told that it was a pilonidal cyst.  He had a procedure/surgery on it and said he has never had problems since then.  But more recently unfortunately because of worsening renal function he was started on dialysis and sitting for long periods of time usually about a 4-hour session several days a week a started to irritate that area and then today his wife noticed that he had some fresh blood.  Was worried that he may be getting either a pressure ulcer or that the pilonidal cyst may have returned.  He denies any fever sweats or chills.  His wife is been using an over-the-counter Neosporin ointment on it.  Fibrillation-he is taking his Coumadin regularly.  Half a tab 1 day a week and a whole tab the other days.  See anticoagulation flowsheet  Past Medical History:  Diagnosis Date  . Brittle bones    per pt, has soft bones in right foot/wears boot cast!  . CAD (coronary artery disease)   . Cataract    Bil/ surg scheduled for right eye 01/18/17/ left eye 02/08/17  . Charcot ankle, right 2019  . CHF (congestive heart failure) ([place]) 2015  . Diabetes mellitus   . Heart failure, diastolic ([place])   . Hyperlipidemia   . Hypertension   . Macular edema 2014  . OSA on CPAP   . Personal history of colonic polyps - adenomas 01/28/2014  . Renal insufficiency   . Shortness of breath dyspnea   . Syncope and collapse     Past Surgical History:  Procedure Laterality Date  . CARPAL TUNNEL RELEASE     left hand  .  COLONOSCOPY    . INTRAVASCULAR PRESSURE WIRE/FFR STUDY N/A 12/18/2018   Procedure: INTRAVASCULAR PRESSURE WIRE/FFR STUDY;  Surgeon: Wellington Hampshire, MD;  Location: [place] [facility];  Service: Cardiovascular;  Laterality: N/A;  . IR FLUORO GUIDE CV LINE RIGHT  12/16/2018  . IR US GUIDE VASC ACCESS RIGHT  12/16/2018  . KNEE ARTHROSCOPY Right 09/13/2016   [facility], Dr. Dorna Leitz  . PILONIDAL CYST EXCISION    . RIGHT/LEFT HEART CATH AND CORONARY ANGIOGRAPHY N/A 12/18/2018   Procedure: RIGHT/LEFT HEART CATH AND CORONARY ANGIOGRAPHY;  Surgeon: Wellington Hampshire, MD;  Location: [place] [facility];  Service: Cardiovascular;  Laterality: N/A;    Family History  Problem Relation Age of Onset  . Diabetes Mother   . Kidney disease Mother   . Diabetes Father   . Coronary artery disease Father   . Diabetes Brother   . Diabetes Sister   . Cancer Maternal Uncle   . Diabetes Maternal Grandmother   . Cancer Paternal Grandmother   . Heart attack Paternal Grandfather   . Colon cancer Neg Hx     Social History   Socioeconomic History  . Marital status: Married    Spouse name: [name]  . Number of children: 5  .  Years of education: 35  . Highest education level: 12th grade  Occupational History  . Occupation: Warehouse    Comment: retired  Scientific laboratory technician  . Financial resource strain: Not hard at all  . Food insecurity:    Worry: Never true    Inability: Never true  . Transportation needs:    Medical: No    Non-medical: No  Tobacco Use  . Smoking status: Former Smoker    Packs/day: 0.50    Years: 20.00    Pack years: 10.00    Types: Cigarettes    Last attempt to quit: 03/06/1977    Years since quitting: 41.8  . Smokeless tobacco: Never Used  Substance and Sexual Activity  . Alcohol use: No  . Drug use: No  . Sexual activity: Not Currently  Lifestyle  . Physical activity:    Days per week: 0 days    Minutes per session: 0 min  . Stress: Not at all  Relationships  .  Social connections:    Talks on phone: Twice a week    Gets together: Twice a week    Attends religious service: More than 4 times per year    Active member of club or organization: No    Attends meetings of clubs or organizations: Never    Relationship status: Married  . Intimate partner violence:    Fear of current or ex partner: No    Emotionally abused: No    Physically abused: No    Forced sexual activity: No  Other Topics Concern  . Not on file  Social History Narrative   Drinks a cup of coffee daily. Drinks a lot of water daily. Goes out with church members during the week    Outpatient Medications Prior to Visit  Medication Sig Dispense Refill  . allopurinol (ZYLOPRIM) 100 MG tablet Take 1.5 tablets (150 mg total) by mouth 2 (two) times daily. 270 tablet 1  . AMBULATORY NON FORMULARY MEDICATION Rollator walker with seat. Dx: M25.561, M54.12, M10.00 1 each 0  . AMBULATORY NON FORMULARY MEDICATION Continuous positive airway pressure (CPAP) device: Auto titrate minimum 4 cm H20 to maximum of 20 cm H2O with pressure. Please provide all supplemental supplies as needed. Fax to: [phone] 1 Units 1  . amLODipine (NORVASC) 10 MG tablet Take 1 tablet (10 mg total) by mouth at bedtime. (Patient taking differently: Take 10 mg by mouth daily. ) 90 tablet 3  . atorvastatin (LIPITOR) 80 MG tablet Take 1 tablet (80 mg total) by mouth daily. 90 tablet 3  . B-D INS SYR ULTRAFINE 1CC/31G 31G X 5/16" 1 ML MISC     . Cholecalciferol (VITAMIN D3) 5000 units CAPS Take 5,000 Units by mouth daily.    . Coenzyme Q10 (CO Q 10 PO) Take 1 tablet by mouth daily.    Marland Kitchen gabapentin (NEURONTIN) 600 MG tablet Take 0.5 tablets (300 mg total) by mouth daily. 30 tablet 0  . hydrALAZINE (APRESOLINE) 100 MG tablet Take 1 tablet (100 mg total) by mouth 3 (three) times daily. 270 tablet 1  . isosorbide mononitrate (ISMO,MONOKET) 20 MG tablet TAKE 1 TABLET (20 MG TOTAL) BY MOUTH 2 (TWO) TIMES DAILY AT 10 AM AND 5 PM.  180 tablet 2  . nitroGLYCERIN (NITROSTAT) 0.4 MG SL tablet Place 1 tablet (0.4 mg total) under the tongue every 5 (five) minutes as needed for chest pain. 25 tablet 0  . NOVOLIN N 100 UNIT/ML injection Inject 35 Units into the skin 2 (two) times  daily before a meal.     . NOVOLIN R 100 UNIT/ML injection Inject 15-20 Units into the skin 3 (three) times daily with meals.   5  . ONE TOUCH ULTRA TEST test strip USE TO CHECK BLOOD SUGAR 4 TIMES A DAY DX E11.65 300 each 11  . Red Yeast Rice Extract (RED YEAST RICE PO) Take 1 tablet by mouth 2 (two) times daily.    Marland Kitchen saccharomyces boulardii (FLORASTOR) 250 MG capsule Take 250 mg by mouth daily.    Marland Kitchen warfarin (COUMADIN) 5 MG tablet Take 1 tablet (5 mg total) by mouth daily at 6 PM for 30 days. Monitor INR. INR goal should be between 2-3. 30 tablet 0   No facility-administered medications prior to visit.     Allergies  Allergen Reactions  . Hydrocodone Nausea And Vomiting    Other reaction(s): GI Upset (intolerance) Projectile vomiting  . Oxycodone Nausea And Vomiting    Other reaction(s): GI Upset (intolerance), Vomiting (intolerance) Projectile vomiting    ROS     Objective:    Physical Exam  Constitutional: He is oriented to person, place, and time. He appears well-developed and well-nourished.  HENT:  Head: Normocephalic and atraumatic.  Eyes: Conjunctivae and EOM are normal.  Cardiovascular: Normal rate.  Pulmonary/Chest: Effort normal.  Neurological: He is alert and oriented to person, place, and time.  Skin: Skin is dry. No pallor.  On the buttock area in the crease just above the anus he does have what looks like an old scar that is well-healed.  I do not notice any induration or swelling or discoloration.  No erythema or rash.  But right at the top he does have a crack in the skin that is actively bleeding.  Just a trace amount.  I did probe the area with a sterile applicator and did not see any tracks or open sinuses.  No sign of  drainage of pus.  I just see mostly some dried blood in the buttock crease.  Psychiatric: He has a normal mood and affect. His behavior is normal.  Vitals reviewed.   BP (!) 125/58   Pulse (!) 58   Temp 97.6 F (36.4 C)   Ht 5\' 9"  (1.753 m)   Wt 226 lb (102.5 kg)   SpO2 98%   BMI 33.37 kg/m  Wt Readings from Last 3 Encounters:  01/07/19 226 lb (102.5 kg)  12/31/18 230 lb (104.3 kg)  12/23/18 239 lb (108.4 kg)    There are no preventive care reminders to display for this patient.  There are no preventive care reminders to display for this patient.   Lab Results  Component Value Date   TSH 2.391 02/02/2017   Lab Results  Component Value Date   WBC 9.4 12/19/2018   HGB 12.8 (L) 12/19/2018   HCT 39.5 12/19/2018   MCV 91.4 12/19/2018   PLT 227 12/19/2018   Lab Results  Component Value Date   NA 138 12/19/2018   K 4.0 12/19/2018   CO2 27 12/19/2018   GLUCOSE 129 (H) 12/19/2018   BUN 52 (H) 12/19/2018   CREATININE 2.34 (H) 12/19/2018   BILITOT 1.5 (H) 12/05/2018   ALKPHOS 98 11/27/2017   AST 18 12/05/2018   ALT 13 12/05/2018   PROT 6.5 12/05/2018   ALBUMIN 3.4 (L) 12/17/2018   CALCIUM 9.1 12/19/2018   ANIONGAP 13 12/19/2018   GFR 38.90 (L) 03/10/2011   Lab Results  Component Value Date   CHOL 99 12/05/2018  Lab Results  Component Value Date   HDL 29 (L) 12/05/2018   Lab Results  Component Value Date   LDLCALC 51 12/05/2018   Lab Results  Component Value Date   TRIG 102 12/05/2018   Lab Results  Component Value Date   CHOLHDL 3.4 12/05/2018   Lab Results  Component Value Date   HGBA1C 5.7 (A) 12/12/2018       Assessment & Plan:   Problem List Items Addressed This Visit    None    Visit Diagnoses    Tear of skin of buttock, unspecified laterality, initial encounter    -  Primary     Tear of the skin.  Probably from excess pressure on the area.  Will treat with mupirocin ointment keep it well moisturized.  Recommended using a doughnut  cushion or U-shaped cushion to take pressure off the area especially while he is sitting for 4 hours for dialysis.  Try to adjust and reposition as much as possible.  If at any point they see any swelling redness increased heat or drainage or pus please let me know immediately.  Especially with a prior history of pilonidal cyst.  Atrial fibrillation-INR is been within therapeutic range today.  We will continue with current regimen and plan to recheck again in 14 days.  Please review anticoagulation flowsheet.  Meds ordered this encounter  Medications  . mupirocin ointment (BACTROBAN) 2 %    Sig: Apply topically 2 (two) times daily. Apply to inside of each nares daily for 10 days then twice a week for maintenance.    Dispense:  30 g    Refill:  0     [name], MD

## 2019-01-08 DIAGNOSIS — N186 End stage renal disease: Secondary | ICD-10-CM | POA: Diagnosis not present

## 2019-01-08 DIAGNOSIS — D631 Anemia in chronic kidney disease: Secondary | ICD-10-CM | POA: Diagnosis not present

## 2019-01-08 DIAGNOSIS — E1122 Type 2 diabetes mellitus with diabetic chronic kidney disease: Secondary | ICD-10-CM | POA: Diagnosis not present

## 2019-01-09 DIAGNOSIS — E113213 Type 2 diabetes mellitus with mild nonproliferative diabetic retinopathy with macular edema, bilateral: Secondary | ICD-10-CM | POA: Diagnosis not present

## 2019-01-09 NOTE — Telephone Encounter (Signed)
I called twice but no answer.  Thus, I left a message the second time and basically explained that no stenting is needed given that the LAD was noted to be chronically occluded with collaterals.  It has been occluded since 2012 and the other lesions did not appear to be obstructive.

## 2019-01-10 DIAGNOSIS — D631 Anemia in chronic kidney disease: Secondary | ICD-10-CM | POA: Diagnosis not present

## 2019-01-10 DIAGNOSIS — N186 End stage renal disease: Secondary | ICD-10-CM | POA: Diagnosis not present

## 2019-01-10 DIAGNOSIS — E1122 Type 2 diabetes mellitus with diabetic chronic kidney disease: Secondary | ICD-10-CM | POA: Diagnosis not present

## 2019-01-13 DIAGNOSIS — E1122 Type 2 diabetes mellitus with diabetic chronic kidney disease: Secondary | ICD-10-CM | POA: Diagnosis not present

## 2019-01-13 DIAGNOSIS — N186 End stage renal disease: Secondary | ICD-10-CM | POA: Diagnosis not present

## 2019-01-13 DIAGNOSIS — D631 Anemia in chronic kidney disease: Secondary | ICD-10-CM | POA: Diagnosis not present

## 2019-01-14 ENCOUNTER — Ambulatory Visit: Payer: Medicare Other

## 2019-01-14 ENCOUNTER — Other Ambulatory Visit: Payer: Self-pay | Admitting: *Deleted

## 2019-01-14 MED ORDER — WARFARIN SODIUM 5 MG PO TABS: 5.0000 mg | ORAL_TABLET | Freq: Every day | ORAL | 1 refills | Status: DC

## 2019-01-15 DIAGNOSIS — E1122 Type 2 diabetes mellitus with diabetic chronic kidney disease: Secondary | ICD-10-CM | POA: Diagnosis not present

## 2019-01-15 DIAGNOSIS — D631 Anemia in chronic kidney disease: Secondary | ICD-10-CM | POA: Diagnosis not present

## 2019-01-15 DIAGNOSIS — N186 End stage renal disease: Secondary | ICD-10-CM | POA: Diagnosis not present

## 2019-01-17 DIAGNOSIS — D631 Anemia in chronic kidney disease: Secondary | ICD-10-CM | POA: Diagnosis not present

## 2019-01-17 DIAGNOSIS — E1122 Type 2 diabetes mellitus with diabetic chronic kidney disease: Secondary | ICD-10-CM | POA: Diagnosis not present

## 2019-01-17 DIAGNOSIS — N186 End stage renal disease: Secondary | ICD-10-CM | POA: Diagnosis not present

## 2019-01-19 DIAGNOSIS — E1122 Type 2 diabetes mellitus with diabetic chronic kidney disease: Secondary | ICD-10-CM | POA: Diagnosis not present

## 2019-01-19 DIAGNOSIS — N186 End stage renal disease: Secondary | ICD-10-CM | POA: Diagnosis not present

## 2019-01-19 DIAGNOSIS — Z992 Dependence on renal dialysis: Secondary | ICD-10-CM | POA: Diagnosis not present

## 2019-01-20 DIAGNOSIS — E1122 Type 2 diabetes mellitus with diabetic chronic kidney disease: Secondary | ICD-10-CM | POA: Diagnosis not present

## 2019-01-20 DIAGNOSIS — N2581 Secondary hyperparathyroidism of renal origin: Secondary | ICD-10-CM | POA: Diagnosis not present

## 2019-01-20 DIAGNOSIS — N186 End stage renal disease: Secondary | ICD-10-CM | POA: Diagnosis not present

## 2019-01-21 ENCOUNTER — Other Ambulatory Visit: Payer: Self-pay

## 2019-01-21 ENCOUNTER — Ambulatory Visit (INDEPENDENT_AMBULATORY_CARE_PROVIDER_SITE_OTHER): Payer: Medicare Other | Admitting: Family Medicine

## 2019-01-21 ENCOUNTER — Encounter: Payer: Self-pay | Admitting: Cardiology

## 2019-01-21 ENCOUNTER — Ambulatory Visit (INDEPENDENT_AMBULATORY_CARE_PROVIDER_SITE_OTHER): Payer: Medicare Other | Admitting: Cardiology

## 2019-01-21 VITALS — BP 132/60 | HR 72 | Ht 69.0 in | Wt 234.0 lb

## 2019-01-21 DIAGNOSIS — I2510 Atherosclerotic heart disease of native coronary artery without angina pectoris: Secondary | ICD-10-CM | POA: Diagnosis not present

## 2019-01-21 DIAGNOSIS — N186 End stage renal disease: Secondary | ICD-10-CM

## 2019-01-21 DIAGNOSIS — E11610 Type 2 diabetes mellitus with diabetic neuropathic arthropathy: Secondary | ICD-10-CM

## 2019-01-21 DIAGNOSIS — I4891 Unspecified atrial fibrillation: Secondary | ICD-10-CM

## 2019-01-21 DIAGNOSIS — G4733 Obstructive sleep apnea (adult) (pediatric): Secondary | ICD-10-CM

## 2019-01-21 DIAGNOSIS — I480 Paroxysmal atrial fibrillation: Secondary | ICD-10-CM | POA: Diagnosis not present

## 2019-01-21 DIAGNOSIS — I5031 Acute diastolic (congestive) heart failure: Secondary | ICD-10-CM

## 2019-01-21 LAB — POCT INR: INR: 3.5 — AB (ref 2.0–3.0)

## 2019-01-21 NOTE — Patient Instructions (Signed)
Take 1/2 tab on Tuesdays and Fridays. A whole tab all other days of the week.  Recheck level in 10 days.

## 2019-01-21 NOTE — Progress Notes (Signed)
01/21/2019 Louis [name]   1946/04/05  [id number]  Primary Physician [name], MD Primary Cardiologist: Dr Stanford Breed  HPI:  73 y.o. male with past medical history of chronic stage V kidney disease, now on HD MWF in [facility], awaiting transplant. Other medical problems include hypertension, hyperlipidemia, diabetes mellitus, and sleep apnea on C-pap. He was admitted 12/14/2018 with volume overload/acute on chronic diastolic congestive heart failure.  Echocardiogram showed vigorous LV function, moderate left ventricular hypertrophy, moderate left atrial enlargement.  Cardiac catheterization 12/18/2018 revealed 60% left main with negative FFR, 50% circumflex, chronically occluded LAD, and 60% right coronary artery.  Plan is medical therapy.  He is in the office today for follow up. He is tolerating dialysis. He is hopefull he will get a transplant soon, his wife is bringing his cath films to [facility] later today.  The patient denies chest pain or SOB.     Current Outpatient Medications  Medication Sig Dispense Refill  . allopurinol (ZYLOPRIM) 100 MG tablet Take 1.5 tablets (150 mg total) by mouth 2 (two) times daily. 270 tablet 1  . AMBULATORY NON FORMULARY MEDICATION Rollator walker with seat. Dx: M25.561, M54.12, M10.00 1 each 0  . AMBULATORY NON FORMULARY MEDICATION Continuous positive airway pressure (CPAP) device: Auto titrate minimum 4 cm H20 to maximum of 20 cm H2O with pressure. Please provide all supplemental supplies as needed. Fax to: [phone] 1 Units 1  . amLODipine (NORVASC) 10 MG tablet Take 1 tablet (10 mg total) by mouth at bedtime. (Patient taking differently: Take 10 mg by mouth daily. ) 90 tablet 3  . atorvastatin (LIPITOR) 80 MG tablet Take 1 tablet (80 mg total) by mouth daily. 90 tablet 3  . B-D INS SYR ULTRAFINE 1CC/31G 31G X 5/16" 1 ML MISC     . Cholecalciferol (VITAMIN D3) 5000 units CAPS Take 5,000 Units by mouth daily.    . Coenzyme Q10 (CO Q 10 PO)  Take 1 tablet by mouth daily.    Marland Kitchen gabapentin (NEURONTIN) 600 MG tablet Take 0.5 tablets (300 mg total) by mouth daily. 30 tablet 0  . hydrALAZINE (APRESOLINE) 100 MG tablet Take 1 tablet (100 mg total) by mouth 3 (three) times daily. 270 tablet 1  . isosorbide mononitrate (ISMO,MONOKET) 20 MG tablet TAKE 1 TABLET (20 MG TOTAL) BY MOUTH 2 (TWO) TIMES DAILY AT 10 AM AND 5 PM. 180 tablet 2  . mupirocin ointment (BACTROBAN) 2 % Apply topically 2 (two) times daily. Apply to inside of each nares daily for 10 days then twice a week for maintenance. 30 g 0  . nitroGLYCERIN (NITROSTAT) 0.4 MG SL tablet Place 1 tablet (0.4 mg total) under the tongue every 5 (five) minutes as needed for chest pain. 25 tablet 0  . NOVOLIN N 100 UNIT/ML injection Inject 25 Units into the skin 2 (two) times daily before a meal.     . NOVOLIN R 100 UNIT/ML injection Inject 15-20 Units into the skin 3 (three) times daily with meals.   5  . ONE TOUCH ULTRA TEST test strip USE TO CHECK BLOOD SUGAR 4 TIMES A DAY DX E11.65 300 each 11  . warfarin (COUMADIN) 5 MG tablet Take 1 tablet (5 mg total) by mouth daily at 6 PM. Monitor INR. INR goal should be between 2-3. 90 tablet 1   No current facility-administered medications for this visit.     Allergies  Allergen Reactions  . Hydrocodone Nausea And Vomiting    Other reaction(s): GI  Upset (intolerance) Projectile vomiting  . Oxycodone Nausea And Vomiting    Other reaction(s): GI Upset (intolerance), Vomiting (intolerance) Projectile vomiting    Past Medical History:  Diagnosis Date  . Brittle bones    per pt, has soft bones in right foot/wears boot cast!  . CAD (coronary artery disease)   . Cataract    Bil/ surg scheduled for right eye 01/18/17/ left eye 02/08/17  . Charcot ankle, right 2019  . CHF (congestive heart failure) ([place]) 2015  . Diabetes mellitus   . Heart failure, diastolic ([place])   . Hyperlipidemia   . Hypertension   . Macular edema 2014  . OSA on CPAP   .  Personal history of colonic polyps - adenomas 01/28/2014  . Renal insufficiency   . Shortness of breath dyspnea   . Syncope and collapse     Social History   Socioeconomic History  . Marital status: Married    Spouse name: [name]  . Number of children: 5  . Years of education: 51  . Highest education level: 12th grade  Occupational History  . Occupation: Warehouse    Comment: retired  Scientific laboratory technician  . Financial resource strain: Not hard at all  . Food insecurity:    Worry: Never true    Inability: Never true  . Transportation needs:    Medical: No    Non-medical: No  Tobacco Use  . Smoking status: Former Smoker    Packs/day: 0.50    Years: 20.00    Pack years: 10.00    Types: Cigarettes    Last attempt to quit: 03/06/1977    Years since quitting: 41.9  . Smokeless tobacco: Never Used  Substance and Sexual Activity  . Alcohol use: No  . Drug use: No  . Sexual activity: Not Currently  Lifestyle  . Physical activity:    Days per week: 0 days    Minutes per session: 0 min  . Stress: Not at all  Relationships  . Social connections:    Talks on phone: Twice a week    Gets together: Twice a week    Attends religious service: More than 4 times per year    Active member of club or organization: No    Attends meetings of clubs or organizations: Never    Relationship status: Married  . Intimate partner violence:    Fear of current or ex partner: No    Emotionally abused: No    Physically abused: No    Forced sexual activity: No  Other Topics Concern  . Not on file  Social History Narrative   Drinks a cup of coffee daily. Drinks a lot of water daily. Goes out with church members during the week     Family History  Problem Relation Age of Onset  . Diabetes Mother   . Kidney disease Mother   . Diabetes Father   . Coronary artery disease Father   . Diabetes Brother   . Diabetes Sister   . Cancer Maternal Uncle   . Diabetes Maternal Grandmother   . Cancer Paternal  Grandmother   . Heart attack Paternal Grandfather   . Colon cancer Neg Hx      Review of Systems: General: negative for chills, fever, night sweats or weight changes.  Cardiovascular: negative for chest pain, dyspnea on exertion, edema, orthopnea, palpitations, paroxysmal nocturnal dyspnea or shortness of breath Dermatological: negative for rash Respiratory: negative for cough or wheezing Urologic: negative for hematuria Abdominal: negative for nausea,  vomiting, diarrhea, bright red blood per rectum, melena, or hematemesis Neurologic: negative for visual changes, syncope, or dizziness All other systems reviewed and are otherwise negative except as noted above.    Blood pressure 132/60, pulse 72, height 5\' 9"  (1.753 m), weight 234 lb (106.1 kg), SpO2 98 %.  General appearance: alert, cooperative, no distress and moderately obese Chest: dialysis cath noted Rt chest Neck: no carotid bruit and no JVD Lungs: clear to auscultation bilaterally Heart: irregularly irregular rhythm Extremities: no edema Skin: Skin color, texture, turgor normal. No rashes or lesions Neurologic: Grossly normal   ASSESSMENT AND PLAN:   1. acute on chronic diastolic congestive heart failure Discharged 12/19/2018- much improved.  Being managed now by dialysis.  2. ESRD cardiac catheterization as outlined.  Plan will be medical therapy.  His LV function is preserved.  No further cardiac work-up prior to renal transplant.  As outlined in previous notes patient has been evaluated for renal transplant at [facility].  Currently on HD in [facility]-MWF  3 paroxysmal atrial fibrillation- patient remains in atrial fibrillation.  His rate is controlled on no medications.  He is now dialysis dependent and we therefore have discontinued apixaban and will treat with Coumadin with goal INR 2-3.    4 coronary artery disease Medical rx-continue statin, Amlodipine, and Isordil.  Off aspirin given need for Coumadin.  5  hypertension-  blood pressure is stable  6 hyperlipidemia-  continue statin.  7. Sleep apnea- Continue C-pap  8. IDDM- Per PCP  PLAN  No change in medical Rx. Keep f/u with Dr Stanford Breed as scheduled.  He knows to contact us if he develops chest pain.   Kerin Ransom PA-C 01/21/2019 11:05 AM

## 2019-01-21 NOTE — Patient Instructions (Signed)
Medication Instructions:  Your physician recommends that you continue on your current medications as directed. Please refer to the Current Medication list given to you today. If you need a refill on your cardiac medications before your next appointment, please call your pharmacy.   Lab work: None  If you have labs (blood work) drawn today and your tests are completely normal, you will receive your results only by: Marland Kitchen MyChart Message (if you have MyChart) OR . A paper copy in the mail If you have any lab test that is abnormal or we need to change your treatment, we will call you to review the results.  Testing/Procedures: None   Follow-Up: At [facility], you and your health needs are our priority.  As part of our continuing mission to provide you with exceptional heart care, we have created designated Provider Care Teams.  These Care Teams include your primary Cardiologist (physician) and Advanced Practice Providers (APPs -  Physician Assistants and Nurse Practitioners) who all work together to provide you with the care you need, when you need it. Marland Kitchen LUKE RECOMMENDS YOU FOLLOW UP AS SCHEDULED  Any Other Special Instructions Will Be Listed Below (If Applicable).

## 2019-01-22 DIAGNOSIS — E1122 Type 2 diabetes mellitus with diabetic chronic kidney disease: Secondary | ICD-10-CM | POA: Diagnosis not present

## 2019-01-22 DIAGNOSIS — N2581 Secondary hyperparathyroidism of renal origin: Secondary | ICD-10-CM | POA: Diagnosis not present

## 2019-01-22 DIAGNOSIS — N186 End stage renal disease: Secondary | ICD-10-CM | POA: Diagnosis not present

## 2019-01-23 ENCOUNTER — Other Ambulatory Visit: Payer: Self-pay

## 2019-01-23 ENCOUNTER — Encounter: Payer: Self-pay | Admitting: Rehabilitative and Restorative Service Providers"

## 2019-01-23 ENCOUNTER — Ambulatory Visit (INDEPENDENT_AMBULATORY_CARE_PROVIDER_SITE_OTHER): Payer: Medicare Other | Admitting: Rehabilitative and Restorative Service Providers"

## 2019-01-23 DIAGNOSIS — M6281 Muscle weakness (generalized): Secondary | ICD-10-CM | POA: Diagnosis not present

## 2019-01-23 DIAGNOSIS — R2689 Other abnormalities of gait and mobility: Secondary | ICD-10-CM

## 2019-01-23 NOTE — Therapy (Addendum)
[place], [place], [address] Phone: [phone]   Fax:  [phone]  Physical Therapy Evaluation  Patient Details  Name: Louis [name] MRN: [id number] Date of Birth: Oct 06, 1946 Referring Provider (PT): Dr Otelia Santee    Encounter Date: 01/23/2019     01/23/19 1503  PT Visits / Re-Eval  Visit Number 1  Number of Visits 12  Date for PT Re-Evaluation 03/06/19  PT Time Calculation  PT Start Time 1532  PT Stop Time 1616  PT Time Calculation (min) 44 min     Past Medical History:  Diagnosis Date  . Brittle bones    per pt, has soft bones in right foot/wears boot cast!  . CAD (coronary artery disease)   . Cataract    Bil/ surg scheduled for right eye 01/18/17/ left eye 02/08/17  . Charcot ankle, right 2019  . CHF (congestive heart failure) ([place]) 2015  . Diabetes mellitus   . Heart failure, diastolic ([place])   . Hyperlipidemia   . Hypertension   . Macular edema 2014  . OSA on CPAP   . Personal history of colonic polyps - adenomas 01/28/2014  . Renal insufficiency   . Shortness of breath dyspnea   . Syncope and collapse     Past Surgical History:  Procedure Laterality Date  . CARPAL TUNNEL RELEASE     left hand  . COLONOSCOPY    . INTRAVASCULAR PRESSURE WIRE/FFR STUDY N/A 12/18/2018   Procedure: INTRAVASCULAR PRESSURE WIRE/FFR STUDY;  Surgeon: Wellington Hampshire, MD;  Location: [facility];  Service: Cardiovascular;  Laterality: N/A;  . IR FLUORO GUIDE CV LINE RIGHT  12/16/2018  . IR US GUIDE VASC ACCESS RIGHT  12/16/2018  . KNEE ARTHROSCOPY Right 09/13/2016   [facility], Dr. Dorna Leitz  . PILONIDAL CYST EXCISION    . RIGHT/LEFT HEART CATH AND CORONARY ANGIOGRAPHY N/A 12/18/2018   Procedure: RIGHT/LEFT HEART CATH AND CORONARY ANGIOGRAPHY;  Surgeon: Wellington Hampshire, MD;  Location: [facility];  Service: Cardiovascular;  Laterality: N/A;    There were no vitals filed  for this visit.   Subjective Assessment - 01/23/19 1537    Subjective  Patient reports that he has weakness in bilat LE's primarily on the Rt. He has a brace on the Rt ankle/LE for the past 3 years. He is scheduled for a kdney transplant and needs to get more stable before surgery.     Pertinent History  ESRD currently on dyalisis 3 days/wk; AODM; Rt knee scope ~ 3 yrs ago; [place] foot Rt requiring bracing; HTN; CAD; arthritis    Patient Stated Goals  stable on his feet with better balance - wants to be stable when he walks     Currently in Pain?  No/denies         [facility] PT Assessment - 01/23/19 0001      Assessment   Medical Diagnosis  dibility    Referring Provider (PT)  Dr Otelia Santee     Onset Date/Surgical Date  04/20/17   decreased strength and stability over the past 3 years    Hand Dominance  Right    Next MD Visit  sees MD weekly     Prior Therapy  yes 11/17-12/17 following scope for Rt knee and before for torn meniscus       Precautions   Precautions  Other (comment)    Precaution Comments  medical problems  Restrictions   Weight Bearing Restrictions  No      Balance Screen   Has the patient fallen in the past 6 months  No    Has the patient had a decrease in activity level because of a fear of falling?   No    Is the patient reluctant to leave their home because of a fear of falling?   No      Home Film/video editor residence    Living Arrangements  Spouse/significant other    Type of [place] to enter    Entrance Stairs-Number of Steps  1    Entrance Stairs-Rails  Can reach both    [place]  One level    Additional Comments  home equipment       Prior Function   Level of Independence  Independent    Vocation  Retired    Biomedical scientist  worked as a Administrator, arts - retired ~ 5 yrs ago     Leisure  mows yard; Publishing copy lives [place] a [place] farm; TV; light household chores       Sensation    Additional Comments  WFL's       Posture/Postural Control   Posture Comments  head forward; flexed forward at hips       AROM   Right Hip Extension  -19    Right Hip Flexion  88    Left Hip Extension  -16    Left Hip Flexion  114    Right Knee Extension  -16    Right Knee Flexion  109    Left Knee Extension  -10    Left Knee Flexion  117      Strength   Right Hip Flexion  4/5    Right Hip Extension  3/5    Right Hip ABduction  3/5    Left Hip Flexion  4+/5    Left Hip Extension  3+/5    Left Hip ABduction  4/5    Right Knee Flexion  4-/5    Right Knee Extension  4/5    Left Knee Flexion  4-/5    Left Knee Extension  4+/5      Flexibility   Hamstrings  R 62 deg; Lt 74 deg     Quadriceps  Rt 87 deg' Lt 90 deg     ITB  tight bilat Rt > Lt     Piriformis  tight bilat Rt > Lt       Transfers   Comments  difficulty with transitional movements and all transfers                 Objective measurements completed on examination: See above findings.      [facility] Adult PT Treatment/Exercise - 01/23/19 0001      Knee/Hip Exercises: Standing   Other Standing Knee Exercises  standing with equal wt bearing and iproved upright posture 1-2 min x 2 reps       Knee/Hip Exercises: Supine   Bridges  Strengthening;Both;2 sets;5 reps   5 sec hold      Knee/Hip Exercises: Prone   Other Prone Exercises  prone lying (to improve hip extension) ~ 2 min              PT Education - 01/23/19 1610    Education Details  HEP     Person(s) Educated  Patient  Methods  Explanation;Demonstration;Tactile cues;Verbal cues;Handout    Comprehension  Verbalized understanding;Returned demonstration;Verbal cues required;Tactile cues required          PT Long Term Goals - 01/23/19 1729      PT LONG TERM GOAL #1   Title  I with advanced HEP 03/06/2019    Time  6    Period  Weeks    Status  New      PT LONG TERM GOAL #2   Title  reports overall improved stability with walking  community distances 03/06/2019    Time  6    Period  Weeks    Status  New      PT LONG TERM GOAL #3   Title  increase LE strength to 4/5 to 5-/5 throughtout 03/06/2019    Time  6    Period  Weeks    Status  New      PT LONG TERM GOAL #4   Title  improve endurance with patient to participate in 30-40 minutes of exercise with only 2-3 short breaks 03/06/2019    Time  6    Period  Weeks    Status  New      PT LONG TERM GOAL #5   Title  Patient demonstrated ability to transfer from seated position to stand without use of UE's 03/06/2019    Time  6    Period  Weeks    Status  New             Plan - 01/23/19 1610    Clinical Impression Statement  Louis [name] presents with decreasing physical abilities related to deconditioning. he has poor posture and alignment; limited trunk and LE mobility/ROM; decreased core and LE strength; decreased balance; abnormal gait pattern; decreased endurance. Patient will benefit from PT to address limitations identified.     Personal Factors and Comorbidities  Age;Fitness;Comorbidity 1;Comorbidity 2;Comorbidity 3+    Comorbidities  ESRD; AODM; HTN; CAD     Examination-Activity Limitations  Bed Mobility;Bend;Locomotion Level;Squat;Stairs;Stand;Transfers    Examination-Participation Restrictions  Community Activity;Yard Work;Driving;Other   retired on disability    Stability/Clinical Decision Making  Evolving/Moderate complexity    Clinical Decision Making  Moderate    Rehab Potential  Good    PT Frequency  2x / week    PT Duration  6 weeks    PT Treatment/Interventions  Patient/family education;ADLs/Self Care Home Management;Cryotherapy;Moist Heat;Functional mobility training;Therapeutic activities;Therapeutic exercise;Balance training;Neuromuscular re-education;Dry needling    PT Next Visit Plan  review and progress with HEP     PT Home Exercise Plan  Access Code: [email or web address]     Consulted and Agree with Plan of Care  Patient       Patient will benefit  from skilled therapeutic intervention in order to improve the following deficits and impairments:  Abnormal gait, Decreased activity tolerance, Decreased balance, Decreased endurance, Decreased mobility, Decreased range of motion, Decreased strength, Impaired flexibility, Postural dysfunction, Improper body mechanics, Obesity  Visit Diagnosis: Muscle weakness (generalized) - Plan: PT plan of care cert/re-cert  Other abnormalities of gait and mobility - Plan: PT plan of care cert/re-cert     Problem List Patient Active Problem List   Diagnosis Date Noted  . Atrial fibrillation ([place]) 12/23/2018  . Anasarca 12/14/2018  . Acute diastolic CHF (congestive heart failure) ([place]) 12/14/2018  . SOB (shortness of breath) on exertion 12/12/2018  . Bilateral pseudophakia 02/27/2018  . Pre-transplant evaluation for kidney transplant 11/27/2017  . Venous insufficiency (chronic) (peripheral) 09/06/2017  . Diabetic  peripheral neuropathy ([place]) 06/12/2017  . Charcot gait 02/02/2017  . Charcot's joint 02/02/2017  . Heart failure, diastolic ([place])   . 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 Diabetic polyneuropathy associated with type 2 diabetes mellitus ([place]) 12/25/2016  . Idiopathic chronic venous hypertension of both lower extremities with inflammation 12/25/2016  . Combined forms of age-related cataract of both eyes 10/19/2016  . Cataract, right 05/18/2016  . Right cervical radiculopathy 05/12/2016  . Chalazion of right upper eyelid 10/28/2015  . 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 ([place]) 10/12/2015  . 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 Gout 04/20/2015  . Osteoarthritis of both acromioclavicular joints 12/03/2014  . Left shoulder pain 11/17/2014  . Unstable angina pectoris ([place]) 10/05/2014  . History of colonic polyps 01/28/2014  . Bilateral carpal tunnel syndrome  01/09/2014  . Macular edema, diabetic ([place]) 10/31/2013  . Obesity (BMI 30-39.9) 07/31/2013  . Fatigue 03/06/2012  . Astigmatism 01/11/2012  . Presbyopia 01/11/2012  . CAD (coronary artery disease), native coronary artery 05/17/2011  . Nonspecific abnormal unspecified cardiovascular function study 03/01/2011  . Bradycardia 02/08/2011  . Right knee pain 02/08/2011  . Hyperlipidemia   . RBBB 02/01/2011  . ESRD (end stage renal disease) ([place]) 10/23/2006  . Proteinuria 10/23/2006  . Obstructive sleep apnea 10/05/2006  . Diabetes mellitus with stage 4 chronic kidney disease ([place]) 08/28/2006  . HYPERTENSION, BENIGN SYSTEMIC 08/28/2006    Patrina Andreas Nilda Simmer PT, MPH  01/23/2019, 5:35 PM  [facility] [place], [place], [address] Phone: [phone]   Fax:  [phone]  Name: Louis [name] MRN: [id number] Date of Birth: 11/09/1946

## 2019-01-23 NOTE — Patient Instructions (Addendum)
Standing tall and straight - straighten hips and knees  Stand during commercials of 30 min TV show  2-3 times a day    Access Code: [email or web address]  URL: [email or web address]  Date: 01/23/2019  Prepared by: Gillermo Murdoch   Exercises  Supine Bridge - 10 reps - 2 sets - 5 sec hold - 2x daily - 7x weekly  Lying Prone - 2 reps - 2-5 min hold - 2x daily - 7x weekly

## 2019-01-24 DIAGNOSIS — N186 End stage renal disease: Secondary | ICD-10-CM | POA: Diagnosis not present

## 2019-01-24 DIAGNOSIS — E1122 Type 2 diabetes mellitus with diabetic chronic kidney disease: Secondary | ICD-10-CM | POA: Diagnosis not present

## 2019-01-24 DIAGNOSIS — N2581 Secondary hyperparathyroidism of renal origin: Secondary | ICD-10-CM | POA: Diagnosis not present

## 2019-01-25 DIAGNOSIS — N2581 Secondary hyperparathyroidism of renal origin: Secondary | ICD-10-CM | POA: Insufficient documentation

## 2019-01-27 DIAGNOSIS — N186 End stage renal disease: Secondary | ICD-10-CM | POA: Diagnosis not present

## 2019-01-27 DIAGNOSIS — N2581 Secondary hyperparathyroidism of renal origin: Secondary | ICD-10-CM | POA: Diagnosis not present

## 2019-01-27 DIAGNOSIS — E1122 Type 2 diabetes mellitus with diabetic chronic kidney disease: Secondary | ICD-10-CM | POA: Diagnosis not present

## 2019-01-29 DIAGNOSIS — E1122 Type 2 diabetes mellitus with diabetic chronic kidney disease: Secondary | ICD-10-CM | POA: Diagnosis not present

## 2019-01-29 DIAGNOSIS — N2581 Secondary hyperparathyroidism of renal origin: Secondary | ICD-10-CM | POA: Diagnosis not present

## 2019-01-29 DIAGNOSIS — N186 End stage renal disease: Secondary | ICD-10-CM | POA: Diagnosis not present

## 2019-01-30 ENCOUNTER — Other Ambulatory Visit: Payer: Self-pay

## 2019-01-30 ENCOUNTER — Ambulatory Visit (INDEPENDENT_AMBULATORY_CARE_PROVIDER_SITE_OTHER): Payer: Medicare Other | Admitting: Physical Therapy

## 2019-01-30 DIAGNOSIS — R2689 Other abnormalities of gait and mobility: Secondary | ICD-10-CM | POA: Diagnosis not present

## 2019-01-30 DIAGNOSIS — M6281 Muscle weakness (generalized): Secondary | ICD-10-CM | POA: Diagnosis not present

## 2019-01-30 NOTE — Therapy (Signed)
[place], [place], [address] Phone: [phone]   Fax:  [phone]  Physical Therapy Treatment  Patient Details  Name: Louis [name] MRN: [id number] Date of Birth: 02-09-1946 Referring Provider (PT): Dr Otelia Santee    Encounter Date: 01/30/2019  PT End of Session - 01/30/19 1453    Visit Number  2    Number of Visits  12    Date for PT Re-Evaluation  03/06/19    PT Start Time  6378    PT Stop Time  1524    PT Time Calculation (min)  31 min    Behavior During Therapy  [facility] for tasks assessed/performed       Past Medical History:  Diagnosis Date  . Brittle bones    per pt, has soft bones in right foot/wears boot cast!  . CAD (coronary artery disease)   . Cataract    Bil/ surg scheduled for right eye 01/18/17/ left eye 02/08/17  . Charcot ankle, right 2019  . CHF (congestive heart failure) ([place]) 2015  . Diabetes mellitus   . Heart failure, diastolic ([place])   . Hyperlipidemia   . Hypertension   . Macular edema 2014  . OSA on CPAP   . Personal history of colonic polyps - adenomas 01/28/2014  . Renal insufficiency   . Shortness of breath dyspnea   . Syncope and collapse     Past Surgical History:  Procedure Laterality Date  . CARPAL TUNNEL RELEASE     left hand  . COLONOSCOPY    . INTRAVASCULAR PRESSURE WIRE/FFR STUDY N/A 12/18/2018   Procedure: INTRAVASCULAR PRESSURE WIRE/FFR STUDY;  Surgeon: Wellington Hampshire, MD;  Location: [place];  Service: Cardiovascular;  Laterality: N/A;  . IR FLUORO GUIDE CV LINE RIGHT  12/16/2018  . IR US GUIDE VASC ACCESS RIGHT  12/16/2018  . KNEE ARTHROSCOPY Right 09/13/2016   [facility], Dr. Dorna Leitz  . PILONIDAL CYST EXCISION    . RIGHT/LEFT HEART CATH AND CORONARY ANGIOGRAPHY N/A 12/18/2018   Procedure: RIGHT/LEFT HEART CATH AND CORONARY ANGIOGRAPHY;  Surgeon: Wellington Hampshire, MD;  Location: [facility];  Service:  Cardiovascular;  Laterality: N/A;    There were no vitals filed for this visit.  Subjective Assessment - 01/30/19 1511    Subjective  Pt reports he has been doing the HEP as able.  He reports no new changes since last visit.     Patient Stated Goals  stable on his feet with better balance - wants to be stable when he walks     Currently in Pain?  Yes    Pain Score  1     Pain Location  Knee    Pain Orientation  Right;Posterior    Pain Descriptors / Indicators  Dull    Aggravating Factors   walking    Pain Relieving Factors  sitting          [facility] PT Assessment - 01/30/19 0001      Assessment   Medical Diagnosis  dibility    Referring Provider (PT)  Dr Otelia Santee     Onset Date/Surgical Date  04/20/17   decreased strength and stability over the past 3 years    Hand Dominance  Right    Next MD Visit  sees MD weekly     Prior Therapy  yes 11/17-12/17 following scope for Rt knee and before for torn meniscus  [facility] Adult PT Treatment/Exercise - 01/30/19 0001      Knee/Hip Exercises: Stretches   Passive Hamstring Stretch  Right;Left;2 reps;30 seconds   seated   Gastroc Stretch  Left;Right;2 reps;30 seconds   runners stretch     Knee/Hip Exercises: Aerobic   Nustep  L4: arms/legs x 6 min       Knee/Hip Exercises: Seated   Long Arc Quad  Strengthening;Right;Left;1 set;5 reps   5 sec hold    Marching  Both;10 reps    Sit to [employer]  1 set;5 reps;without UE support;with UE support   black mat with 3" pad.      Knee/Hip Exercises: Supine   Bridges  Strengthening;Both;2 sets;5 reps   5 sec hold      Knee/Hip Exercises: Sidelying   Hip ABduction  Strengthening;Right;Left;1 set;10 reps      Knee/Hip Exercises: Prone   Other Prone Exercises  prone on elbows x 20 sec x 2 reps    to assist with hip ext            PT Education - 01/30/19 1536    Education Details  HEP     Person(s) Educated  Patient    Methods  Explanation;Verbal cues;Handout    Comprehension   Verbalized understanding;Returned demonstration          PT Long Term Goals - 01/23/19 1729      PT LONG TERM GOAL #1   Title  I with advanced HEP 03/06/2019    Time  6    Period  Weeks    Status  New      PT LONG TERM GOAL #2   Title  reports overall improved stability with walking community distances 03/06/2019    Time  6    Period  Weeks    Status  New      PT LONG TERM GOAL #3   Title  increase LE strength to 4/5 to 5-/5 throughtout 03/06/2019    Time  6    Period  Weeks    Status  New      PT LONG TERM GOAL #4   Title  improve endurance with patient to participate in 30-40 minutes of exercise with only 2-3 short breaks 03/06/2019    Time  6    Period  Weeks    Status  New      PT LONG TERM GOAL #5   Title  Patient demonstrated ability to transfer from seated position to stand without use of UE's 03/06/2019    Time  6    Period  Weeks    Status  New            Plan - 01/30/19 1536    Clinical Impression Statement  Pt tolerated short sets of exercises for strengthening LE.  Added these exercises to HEP.  Pt tolerated all without increase in knee pain.  Goals are ongoing at this time. Pt will benefit from continued PT intervention to max functional mobility.     Rehab Potential  Good    PT Frequency  2x / week    PT Duration  6 weeks    PT Treatment/Interventions  Patient/family education;ADLs/Self Care Home Management;Cryotherapy;Moist Heat;Functional mobility training;Therapeutic activities;Therapeutic exercise;Balance training;Neuromuscular re-education;Dry needling    PT Next Visit Plan  review and progress with HEP as tolerated; balance.     PT Home Exercise Plan  Access Code: [email or web address]     Consulted and Agree with Plan of Care  Patient  Patient will benefit from skilled therapeutic intervention in order to improve the following deficits and impairments:  Abnormal gait, Decreased activity tolerance, Decreased balance, Decreased endurance, Decreased  mobility, Decreased range of motion, Decreased strength, Impaired flexibility, Postural dysfunction, Improper body mechanics, Obesity  Visit Diagnosis: Muscle weakness (generalized)  Other abnormalities of gait and mobility     Problem List Patient Active Problem List   Diagnosis Date Noted  . Atrial fibrillation ([place]) 12/23/2018  . Anasarca 12/14/2018  . Acute diastolic CHF (congestive heart failure) ([place]) 12/14/2018  . SOB (shortness of breath) on exertion 12/12/2018  . Bilateral pseudophakia 02/27/2018  . Pre-transplant evaluation for kidney transplant 11/27/2017  . Venous insufficiency (chronic) (peripheral) 09/06/2017  . Diabetic peripheral neuropathy ([place]) 06/12/2017  . Charcot gait 02/02/2017  . Charcot's joint 02/02/2017  . Heart failure, diastolic ([place])   . 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 Diabetic polyneuropathy associated with type 2 diabetes mellitus ([place]) 12/25/2016  . Idiopathic chronic venous hypertension of both lower extremities with inflammation 12/25/2016  . Combined forms of age-related cataract of both eyes 10/19/2016  . Cataract, right 05/18/2016  . Right cervical radiculopathy 05/12/2016  . Chalazion of right upper eyelid 10/28/2015  . 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 ([place]) 10/12/2015  . 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 Gout 04/20/2015  . Osteoarthritis of both acromioclavicular joints 12/03/2014  . Left shoulder pain 11/17/2014  . Unstable angina pectoris ([place]) 10/05/2014  . History of colonic polyps 01/28/2014  . Bilateral carpal tunnel syndrome 01/09/2014  . Macular edema, diabetic ([place]) 10/31/2013  . Obesity (BMI 30-39.9) 07/31/2013  . Fatigue 03/06/2012  . Astigmatism 01/11/2012  . Presbyopia 01/11/2012  . CAD (coronary artery disease), native coronary artery 05/17/2011  . Nonspecific abnormal  unspecified cardiovascular function study 03/01/2011  . Bradycardia 02/08/2011  . Right knee pain 02/08/2011  . Hyperlipidemia   . RBBB 02/01/2011  . ESRD (end stage renal disease) ([place]) 10/23/2006  . Proteinuria 10/23/2006  . Obstructive sleep apnea 10/05/2006  . Diabetes mellitus with stage 4 chronic kidney disease ([place]) 08/28/2006  . HYPERTENSION, BENIGN SYSTEMIC 08/28/2006   Kerin Perna, PTA 01/30/19 3:46 PM  [place], [place], [address] Phone: [phone]   Fax:  [phone]  Name: Louis [name] MRN: [id number] Date of Birth: 1946/07/18

## 2019-01-30 NOTE — Patient Instructions (Addendum)
HIP: Abduction - Side-Lying    Lie on side, legs straight and in line with trunk. Squeeze glutes. Raise top leg up and slightly back. Point toes forward. _10__ reps per set, _1__ sets per day, _4__ days per week Bend bottom leg to stabilize pelvis.  KNEE: Extension, Long Arc Quads - Sitting    Raise leg until knee is straight. Hold for 5 seconds __5-10_ reps per set, _1__ sets per day, _4__ days per week  Marching    Alternate lifting knees as high as is comfortable, as if marching. Repeat _10__ times each leg. Do _3__ sessions per day. Note: If possible place feet on floor. SIT TO STAND: No Device    Sit with feet shoulder-width apart, on floor. Lean chest forward, raise hips up from surface. Straighten hips and knees. Weight bear equally on left and right sides. _5__ reps per set, _2__ sets per day, __4_ days per week Place left leg closer to sitting surface.  HIP: Hamstrings - Short Sitting    Rest leg on raised surface. Keep knee straight. Lift chest. Hold _20__ seconds. _2__ reps per set, __1-2_ sets per day, _7__ days per week  Calf Stretch    Place hands on wall at shoulder height. Keeping back leg straight, bend front leg, feet pointing forward, heels flat on floor. Lean forward slightly until stretch is felt in calf of back leg. Hold stretch __20_ seconds, breathing slowly in and out. Repeat stretch with other leg back. Do _1-2__ sessions per day.

## 2019-01-31 ENCOUNTER — Ambulatory Visit (INDEPENDENT_AMBULATORY_CARE_PROVIDER_SITE_OTHER): Payer: Medicare Other | Admitting: Family Medicine

## 2019-01-31 DIAGNOSIS — N186 End stage renal disease: Secondary | ICD-10-CM | POA: Diagnosis not present

## 2019-01-31 DIAGNOSIS — I4891 Unspecified atrial fibrillation: Secondary | ICD-10-CM

## 2019-01-31 DIAGNOSIS — E1122 Type 2 diabetes mellitus with diabetic chronic kidney disease: Secondary | ICD-10-CM | POA: Diagnosis not present

## 2019-01-31 DIAGNOSIS — N2581 Secondary hyperparathyroidism of renal origin: Secondary | ICD-10-CM | POA: Diagnosis not present

## 2019-01-31 LAB — POCT INR: INR: 2 (ref 2.0–3.0)

## 2019-01-31 NOTE — Addendum Note (Signed)
Addended by: [name] D on: 01/31/2019 10:40 AM   Modules accepted: Orders

## 2019-01-31 NOTE — Progress Notes (Signed)
[name] advised. She will call back when they get home from dialysis appointment to schedule follow up.

## 2019-02-03 DIAGNOSIS — N2581 Secondary hyperparathyroidism of renal origin: Secondary | ICD-10-CM | POA: Diagnosis not present

## 2019-02-03 DIAGNOSIS — N186 End stage renal disease: Secondary | ICD-10-CM | POA: Diagnosis not present

## 2019-02-03 DIAGNOSIS — E1122 Type 2 diabetes mellitus with diabetic chronic kidney disease: Secondary | ICD-10-CM | POA: Diagnosis not present

## 2019-02-04 ENCOUNTER — Other Ambulatory Visit: Payer: Self-pay

## 2019-02-04 ENCOUNTER — Encounter: Payer: Self-pay | Admitting: Rehabilitative and Restorative Service Providers"

## 2019-02-04 ENCOUNTER — Ambulatory Visit (INDEPENDENT_AMBULATORY_CARE_PROVIDER_SITE_OTHER): Payer: Medicare Other | Admitting: Rehabilitative and Restorative Service Providers"

## 2019-02-04 DIAGNOSIS — M6281 Muscle weakness (generalized): Secondary | ICD-10-CM

## 2019-02-04 DIAGNOSIS — R2689 Other abnormalities of gait and mobility: Secondary | ICD-10-CM

## 2019-02-04 NOTE — Patient Instructions (Signed)
Access Code: [email or web address]  URL: [email or web address]  Date: 02/04/2019  Prepared by: Gillermo Murdoch   Exercises  Supine Bridge - 10 reps - 2 sets - 5 sec hold - 2x daily - 7x weekly  Lying Prone - 2 reps - 2-5 min hold - 2x daily - 7x weekly    Sit to Stand - 10 reps - 1-2 sets - 2sec hold - 2x daily - 7x weekly  Standing Hip Extension - 10 reps - 1-2 sets - 2 sec hold - 2x daily - 7x weekly  Standing Hip Abduction - 10 reps - 1-2 sets - 2 sec hold - 2x daily - 7x weekly  Wide Tandem Stance with Eyes Open - 5 reps - 1-2 sets - 20-30 sec hold - 2x daily - 7x weekly  Step Up - 10 reps - 1-2 sets - sec hold - 2x daily - 7x weekly  Sideways Walking - sec hold - 2x daily - 7x weekly  Backwards Walking - sec hold - 2x daily - 7x weekly

## 2019-02-04 NOTE — Therapy (Addendum)
[place], [place], [address] Phone: [phone]   Fax:  [phone]  Physical Therapy Treatment  Patient Details  Name: Louis [name] MRN: [id number] Date of Birth: 1946-08-30 Referring Provider (PT): Dr Otelia Santee    Encounter Date: 02/04/2019  PT End of Session - 02/04/19 0937    Visit Number  3    Number of Visits  12    Date for PT Re-Evaluation  03/06/19    PT Start Time  0932    PT Stop Time  1013    PT Time Calculation (min)  41 min    Activity Tolerance  Patient tolerated treatment well       Past Medical History:  Diagnosis Date  . Brittle bones    per pt, has soft bones in right foot/wears boot cast!  . CAD (coronary artery disease)   . Cataract    Bil/ surg scheduled for right eye 01/18/17/ left eye 02/08/17  . Charcot ankle, right 2019  . CHF (congestive heart failure) ([place]) 2015  . Diabetes mellitus   . Heart failure, diastolic ([place])   . Hyperlipidemia   . Hypertension   . Macular edema 2014  . OSA on CPAP   . Personal history of colonic polyps - adenomas 01/28/2014  . Renal insufficiency   . Shortness of breath dyspnea   . Syncope and collapse     Past Surgical History:  Procedure Laterality Date  . CARPAL TUNNEL RELEASE     left hand  . COLONOSCOPY    . INTRAVASCULAR PRESSURE WIRE/FFR STUDY N/A 12/18/2018   Procedure: INTRAVASCULAR PRESSURE WIRE/FFR STUDY;  Surgeon: Wellington Hampshire, MD;  Location: [facility];  Service: Cardiovascular;  Laterality: N/A;  . IR FLUORO GUIDE CV LINE RIGHT  12/16/2018  . IR US GUIDE VASC ACCESS RIGHT  12/16/2018  . KNEE ARTHROSCOPY Right 09/13/2016   [facility], Dr. Dorna Leitz  . PILONIDAL CYST EXCISION    . RIGHT/LEFT HEART CATH AND CORONARY ANGIOGRAPHY N/A 12/18/2018   Procedure: RIGHT/LEFT HEART CATH AND CORONARY ANGIOGRAPHY;  Surgeon: Wellington Hampshire, MD;  Location: [facility];  Service: Cardiovascular;   Laterality: N/A;    There were no vitals filed for this visit.  Subjective Assessment - 02/04/19 0938    Subjective  Had dialysis yesterday and is tired today. Got up with his blood sugar at 55 - did eat toast and drank some juice - feels better now.     Currently in Pain?  No/denies                       [facility] Adult PT Treatment/Exercise - 02/04/19 0001      Knee/Hip Exercises: Aerobic   Nustep  L4: arms/legs x 6 min       Knee/Hip Exercises: Standing   Hip Abduction  AROM;Right;Left;1 set;10 reps   VC to lead with heel and keep knee straight    Hip Extension  Stengthening;Right;Left;1 set;10 reps;Knee straight   VC for kenee straight and moving slowly with control    Forward Step Up  Right;Left;10 reps;Hand Hold: 2;Hand Hold: 1;Step Height: 4"    Wall Squat  10 reps;3 seconds   shallow bend to avoid knee pain    Gait Training  tandum stance w/ UE support as needed - 10-20 sec x 5 each foot forward     Other Standing Knee Exercises  side steps at counter  toward each side 12 feet x 3 reps each direction; backward walking x 12 feet x 2 reps       Knee/Hip Exercises: Seated   Sit to Sand  2 sets;5 reps;without UE support   high low table             PT Education - 02/04/19 1005    Education Details  HEP     Person(s) Educated  Patient    Methods  Explanation;Demonstration;Tactile cues;Verbal cues;Handout    Comprehension  Verbalized understanding;Returned demonstration;Verbal cues required;Tactile cues required          PT Long Term Goals - 01/23/19 1729      PT LONG TERM GOAL #1   Title  I with advanced HEP 03/06/2019    Time  6    Period  Weeks    Status  New      PT LONG TERM GOAL #2   Title  reports overall improved stability with walking community distances 03/06/2019    Time  6    Period  Weeks    Status  New      PT LONG TERM GOAL #3   Title  increase LE strength to 4/5 to 5-/5 throughtout 03/06/2019    Time  6    Period  Weeks     Status  New      PT LONG TERM GOAL #4   Title  improve endurance with patient to participate in 30-40 minutes of exercise with only 2-3 short breaks 03/06/2019    Time  6    Period  Weeks    Status  New      PT LONG TERM GOAL #5   Title  Patient demonstrated ability to transfer from seated position to stand without use of UE's 03/06/2019    Time  6    Period  Weeks    Status  New            Plan - 02/04/19 0160    Clinical Impression Statement  Patient worked on ther exercise in wt bearing. Patient tolerated exercise with some breaks for rest. Encouraged patient to use wt bearing exercises when he could and exercises lying down when he had less energy. Discussed importance of protecting himself from [place] 19 virus with his health conditions.     Rehab Potential  Good    PT Frequency  2x / week    PT Duration  6 weeks    PT Treatment/Interventions  Patient/family education;ADLs/Self Care Home Management;Cryotherapy;Moist Heat;Functional mobility training;Therapeutic activities;Therapeutic exercise;Balance training;Neuromuscular re-education;Dry needling    PT Next Visit Plan  review and progress with HEP as tolerated; balance.     PT Home Exercise Plan  Access Code: [email or web address]     Consulted and Agree with Plan of Care  Patient       Patient will benefit from skilled therapeutic intervention in order to improve the following deficits and impairments:  Abnormal gait, Decreased activity tolerance, Decreased balance, Decreased endurance, Decreased mobility, Decreased range of motion, Decreased strength, Impaired flexibility, Postural dysfunction, Improper body mechanics, Obesity  Visit Diagnosis: Muscle weakness (generalized)  Other abnormalities of gait and mobility     Problem List Patient Active Problem List   Diagnosis Date Noted  . Atrial fibrillation ([place]) 12/23/2018  . Anasarca 12/14/2018  . Acute diastolic CHF (congestive heart failure) ([place]) 12/14/2018  . SOB  (shortness of breath) on exertion 12/12/2018  . Bilateral pseudophakia 02/27/2018  . Pre-transplant evaluation for kidney transplant  11/27/2017  . Venous insufficiency (chronic) (peripheral) 09/06/2017  . Diabetic peripheral neuropathy ([place]) 06/12/2017  . Charcot gait 02/02/2017  . Charcot's joint 02/02/2017  . Heart failure, diastolic ([place])   . 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 Diabetic polyneuropathy associated with type 2 diabetes mellitus ([place]) 12/25/2016  . Idiopathic chronic venous hypertension of both lower extremities with inflammation 12/25/2016  . Combined forms of age-related cataract of both eyes 10/19/2016  . Cataract, right 05/18/2016  . Right cervical radiculopathy 05/12/2016  . Chalazion of right upper eyelid 10/28/2015  . 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 ([place]) 10/12/2015  . 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 Gout 04/20/2015  . Osteoarthritis of both acromioclavicular joints 12/03/2014  . Left shoulder pain 11/17/2014  . Unstable angina pectoris ([place]) 10/05/2014  . History of colonic polyps 01/28/2014  . Bilateral carpal tunnel syndrome 01/09/2014  . Macular edema, diabetic ([place]) 10/31/2013  . Obesity (BMI 30-39.9) 07/31/2013  . Fatigue 03/06/2012  . Astigmatism 01/11/2012  . Presbyopia 01/11/2012  . CAD (coronary artery disease), native coronary artery 05/17/2011  . Nonspecific abnormal unspecified cardiovascular function study 03/01/2011  . Bradycardia 02/08/2011  . Right knee pain 02/08/2011  . Hyperlipidemia   . RBBB 02/01/2011  . ESRD (end stage renal disease) ([place]) 10/23/2006  . Proteinuria 10/23/2006  . Obstructive sleep apnea 10/05/2006  . Diabetes mellitus with stage 4 chronic kidney disease ([place]) 08/28/2006  . HYPERTENSION, BENIGN SYSTEMIC 08/28/2006    Elayjah Chaney Nilda Simmer PT, MPH  02/04/2019, 10:11 AM  [facility] [place], [place], [address] Phone: [phone]   Fax:  [phone]  Name: Louis [name] MRN: [id number] Date of Birth: [birth year]  PHYSICAL THERAPY DISCHARGE SUMMARY  Visits from Start of Care: 3  Current functional level related to goals / functional outcomes: See last progress note for discharge status.   Remaining deficits: Unknown    Education / Equipment: HEP  Plan: Patient agrees to discharge.  Patient goals were not met. Patient is being discharged due to                                                     ?????    Patient and wife determined that he is not comfortable coming in for therapy during [place] quarantine due to multiple medical problems. He will be discharged to independent HEP at this time.   Arleth Mccullar P. Helene Kelp PT, MPH 03/17/19 8:26 AM

## 2019-02-05 DIAGNOSIS — E1122 Type 2 diabetes mellitus with diabetic chronic kidney disease: Secondary | ICD-10-CM | POA: Diagnosis not present

## 2019-02-05 DIAGNOSIS — N2581 Secondary hyperparathyroidism of renal origin: Secondary | ICD-10-CM | POA: Diagnosis not present

## 2019-02-05 DIAGNOSIS — N186 End stage renal disease: Secondary | ICD-10-CM | POA: Diagnosis not present

## 2019-02-06 DIAGNOSIS — E113213 Type 2 diabetes mellitus with mild nonproliferative diabetic retinopathy with macular edema, bilateral: Secondary | ICD-10-CM | POA: Diagnosis not present

## 2019-02-07 ENCOUNTER — Telehealth: Payer: Self-pay | Admitting: Physical Therapy

## 2019-02-07 DIAGNOSIS — N2581 Secondary hyperparathyroidism of renal origin: Secondary | ICD-10-CM | POA: Diagnosis not present

## 2019-02-07 DIAGNOSIS — E1122 Type 2 diabetes mellitus with diabetic chronic kidney disease: Secondary | ICD-10-CM | POA: Diagnosis not present

## 2019-02-07 DIAGNOSIS — N186 End stage renal disease: Secondary | ICD-10-CM | POA: Diagnosis not present

## 2019-02-07 NOTE — Telephone Encounter (Signed)
Called patient to inform  that [facility] will be closed until 4/6 due to COVID-19. Patient not home, so spoke to his wife, [name].   She stated her husband has HEP and they will continue to work on these exercises until clinic reopens. [name] encouraged to call clinic with any concerns or questions.   Appts rescheduled for after April 6, and these appts were confirmed.   Kerin Perna, PTA 02/07/19 3:16 PM

## 2019-02-10 DIAGNOSIS — E1122 Type 2 diabetes mellitus with diabetic chronic kidney disease: Secondary | ICD-10-CM | POA: Diagnosis not present

## 2019-02-10 DIAGNOSIS — N186 End stage renal disease: Secondary | ICD-10-CM | POA: Diagnosis not present

## 2019-02-10 DIAGNOSIS — N2581 Secondary hyperparathyroidism of renal origin: Secondary | ICD-10-CM | POA: Diagnosis not present

## 2019-02-11 ENCOUNTER — Other Ambulatory Visit: Payer: Self-pay

## 2019-02-11 ENCOUNTER — Ambulatory Visit (INDEPENDENT_AMBULATORY_CARE_PROVIDER_SITE_OTHER): Payer: Medicare Other | Admitting: Family Medicine

## 2019-02-11 ENCOUNTER — Encounter: Payer: Medicare Other | Admitting: Rehabilitative and Restorative Service Providers"

## 2019-02-11 DIAGNOSIS — I4891 Unspecified atrial fibrillation: Secondary | ICD-10-CM | POA: Diagnosis not present

## 2019-02-11 LAB — POCT INR: INR: 2.5 (ref 2.0–3.0)

## 2019-02-12 DIAGNOSIS — N186 End stage renal disease: Secondary | ICD-10-CM | POA: Diagnosis not present

## 2019-02-12 DIAGNOSIS — N2581 Secondary hyperparathyroidism of renal origin: Secondary | ICD-10-CM | POA: Diagnosis not present

## 2019-02-12 DIAGNOSIS — E1122 Type 2 diabetes mellitus with diabetic chronic kidney disease: Secondary | ICD-10-CM | POA: Diagnosis not present

## 2019-02-12 NOTE — Progress Notes (Signed)
[name] advised. Follow up scheduled.

## 2019-02-13 ENCOUNTER — Encounter: Payer: Medicare Other | Admitting: Physical Therapy

## 2019-02-14 DIAGNOSIS — N2581 Secondary hyperparathyroidism of renal origin: Secondary | ICD-10-CM | POA: Diagnosis not present

## 2019-02-14 DIAGNOSIS — E1122 Type 2 diabetes mellitus with diabetic chronic kidney disease: Secondary | ICD-10-CM | POA: Diagnosis not present

## 2019-02-14 DIAGNOSIS — N186 End stage renal disease: Secondary | ICD-10-CM | POA: Diagnosis not present

## 2019-02-17 DIAGNOSIS — E1122 Type 2 diabetes mellitus with diabetic chronic kidney disease: Secondary | ICD-10-CM | POA: Diagnosis not present

## 2019-02-17 DIAGNOSIS — N186 End stage renal disease: Secondary | ICD-10-CM | POA: Diagnosis not present

## 2019-02-17 DIAGNOSIS — N2581 Secondary hyperparathyroidism of renal origin: Secondary | ICD-10-CM | POA: Diagnosis not present

## 2019-02-19 DIAGNOSIS — Z992 Dependence on renal dialysis: Secondary | ICD-10-CM | POA: Diagnosis not present

## 2019-02-19 DIAGNOSIS — N186 End stage renal disease: Secondary | ICD-10-CM | POA: Diagnosis not present

## 2019-02-19 DIAGNOSIS — N2581 Secondary hyperparathyroidism of renal origin: Secondary | ICD-10-CM | POA: Diagnosis not present

## 2019-02-19 DIAGNOSIS — E1122 Type 2 diabetes mellitus with diabetic chronic kidney disease: Secondary | ICD-10-CM | POA: Diagnosis not present

## 2019-02-19 DIAGNOSIS — Z23 Encounter for immunization: Secondary | ICD-10-CM | POA: Diagnosis not present

## 2019-02-20 DIAGNOSIS — E113213 Type 2 diabetes mellitus with mild nonproliferative diabetic retinopathy with macular edema, bilateral: Secondary | ICD-10-CM | POA: Diagnosis not present

## 2019-02-21 DIAGNOSIS — Z23 Encounter for immunization: Secondary | ICD-10-CM | POA: Diagnosis not present

## 2019-02-21 DIAGNOSIS — E1122 Type 2 diabetes mellitus with diabetic chronic kidney disease: Secondary | ICD-10-CM | POA: Diagnosis not present

## 2019-02-21 DIAGNOSIS — N2581 Secondary hyperparathyroidism of renal origin: Secondary | ICD-10-CM | POA: Diagnosis not present

## 2019-02-21 DIAGNOSIS — N186 End stage renal disease: Secondary | ICD-10-CM | POA: Diagnosis not present

## 2019-02-24 DIAGNOSIS — N2581 Secondary hyperparathyroidism of renal origin: Secondary | ICD-10-CM | POA: Diagnosis not present

## 2019-02-24 DIAGNOSIS — Z23 Encounter for immunization: Secondary | ICD-10-CM | POA: Diagnosis not present

## 2019-02-24 DIAGNOSIS — N186 End stage renal disease: Secondary | ICD-10-CM | POA: Diagnosis not present

## 2019-02-24 DIAGNOSIS — E1122 Type 2 diabetes mellitus with diabetic chronic kidney disease: Secondary | ICD-10-CM | POA: Diagnosis not present

## 2019-02-25 ENCOUNTER — Encounter: Payer: Self-pay | Admitting: Physical Therapy

## 2019-02-26 DIAGNOSIS — Z23 Encounter for immunization: Secondary | ICD-10-CM | POA: Diagnosis not present

## 2019-02-26 DIAGNOSIS — N2581 Secondary hyperparathyroidism of renal origin: Secondary | ICD-10-CM | POA: Diagnosis not present

## 2019-02-26 DIAGNOSIS — E1122 Type 2 diabetes mellitus with diabetic chronic kidney disease: Secondary | ICD-10-CM | POA: Diagnosis not present

## 2019-02-26 DIAGNOSIS — N186 End stage renal disease: Secondary | ICD-10-CM | POA: Diagnosis not present

## 2019-02-27 ENCOUNTER — Encounter: Payer: Self-pay | Admitting: Physical Therapy

## 2019-02-28 DIAGNOSIS — Z23 Encounter for immunization: Secondary | ICD-10-CM | POA: Diagnosis not present

## 2019-02-28 DIAGNOSIS — N2581 Secondary hyperparathyroidism of renal origin: Secondary | ICD-10-CM | POA: Diagnosis not present

## 2019-02-28 DIAGNOSIS — N186 End stage renal disease: Secondary | ICD-10-CM | POA: Diagnosis not present

## 2019-02-28 DIAGNOSIS — E1122 Type 2 diabetes mellitus with diabetic chronic kidney disease: Secondary | ICD-10-CM | POA: Diagnosis not present

## 2019-03-03 DIAGNOSIS — Z23 Encounter for immunization: Secondary | ICD-10-CM | POA: Diagnosis not present

## 2019-03-03 DIAGNOSIS — E1122 Type 2 diabetes mellitus with diabetic chronic kidney disease: Secondary | ICD-10-CM | POA: Diagnosis not present

## 2019-03-03 DIAGNOSIS — N186 End stage renal disease: Secondary | ICD-10-CM | POA: Diagnosis not present

## 2019-03-03 DIAGNOSIS — N2581 Secondary hyperparathyroidism of renal origin: Secondary | ICD-10-CM | POA: Diagnosis not present

## 2019-03-04 ENCOUNTER — Ambulatory Visit (INDEPENDENT_AMBULATORY_CARE_PROVIDER_SITE_OTHER): Payer: Medicare Other | Admitting: Family Medicine

## 2019-03-04 ENCOUNTER — Other Ambulatory Visit: Payer: Self-pay

## 2019-03-04 DIAGNOSIS — I4891 Unspecified atrial fibrillation: Secondary | ICD-10-CM

## 2019-03-04 LAB — POCT INR: INR: 2.1 (ref 2.0–3.0)

## 2019-03-04 NOTE — Progress Notes (Signed)
Recheck INR 3 weeks. Same dose.

## 2019-03-05 DIAGNOSIS — E1122 Type 2 diabetes mellitus with diabetic chronic kidney disease: Secondary | ICD-10-CM | POA: Diagnosis not present

## 2019-03-05 DIAGNOSIS — N2581 Secondary hyperparathyroidism of renal origin: Secondary | ICD-10-CM | POA: Diagnosis not present

## 2019-03-05 DIAGNOSIS — Z23 Encounter for immunization: Secondary | ICD-10-CM | POA: Diagnosis not present

## 2019-03-05 DIAGNOSIS — N186 End stage renal disease: Secondary | ICD-10-CM | POA: Diagnosis not present

## 2019-03-07 DIAGNOSIS — N186 End stage renal disease: Secondary | ICD-10-CM | POA: Diagnosis not present

## 2019-03-07 DIAGNOSIS — E1122 Type 2 diabetes mellitus with diabetic chronic kidney disease: Secondary | ICD-10-CM | POA: Diagnosis not present

## 2019-03-07 DIAGNOSIS — Z23 Encounter for immunization: Secondary | ICD-10-CM | POA: Diagnosis not present

## 2019-03-07 DIAGNOSIS — N2581 Secondary hyperparathyroidism of renal origin: Secondary | ICD-10-CM | POA: Diagnosis not present

## 2019-03-10 DIAGNOSIS — N2581 Secondary hyperparathyroidism of renal origin: Secondary | ICD-10-CM | POA: Diagnosis not present

## 2019-03-10 DIAGNOSIS — N186 End stage renal disease: Secondary | ICD-10-CM | POA: Diagnosis not present

## 2019-03-10 DIAGNOSIS — E1122 Type 2 diabetes mellitus with diabetic chronic kidney disease: Secondary | ICD-10-CM | POA: Diagnosis not present

## 2019-03-10 DIAGNOSIS — Z23 Encounter for immunization: Secondary | ICD-10-CM | POA: Diagnosis not present

## 2019-03-12 DIAGNOSIS — N2581 Secondary hyperparathyroidism of renal origin: Secondary | ICD-10-CM | POA: Diagnosis not present

## 2019-03-12 DIAGNOSIS — Z23 Encounter for immunization: Secondary | ICD-10-CM | POA: Diagnosis not present

## 2019-03-12 DIAGNOSIS — E1122 Type 2 diabetes mellitus with diabetic chronic kidney disease: Secondary | ICD-10-CM | POA: Diagnosis not present

## 2019-03-12 DIAGNOSIS — N186 End stage renal disease: Secondary | ICD-10-CM | POA: Diagnosis not present

## 2019-03-13 ENCOUNTER — Encounter: Payer: Self-pay | Admitting: Family Medicine

## 2019-03-14 DIAGNOSIS — N186 End stage renal disease: Secondary | ICD-10-CM | POA: Diagnosis not present

## 2019-03-14 DIAGNOSIS — Z23 Encounter for immunization: Secondary | ICD-10-CM | POA: Diagnosis not present

## 2019-03-14 DIAGNOSIS — E1122 Type 2 diabetes mellitus with diabetic chronic kidney disease: Secondary | ICD-10-CM | POA: Diagnosis not present

## 2019-03-14 DIAGNOSIS — N2581 Secondary hyperparathyroidism of renal origin: Secondary | ICD-10-CM | POA: Diagnosis not present

## 2019-03-17 ENCOUNTER — Ambulatory Visit (INDEPENDENT_AMBULATORY_CARE_PROVIDER_SITE_OTHER): Payer: Medicare Other | Admitting: Family Medicine

## 2019-03-17 ENCOUNTER — Encounter: Payer: Self-pay | Admitting: Family Medicine

## 2019-03-17 DIAGNOSIS — N186 End stage renal disease: Secondary | ICD-10-CM | POA: Diagnosis not present

## 2019-03-17 DIAGNOSIS — E1122 Type 2 diabetes mellitus with diabetic chronic kidney disease: Secondary | ICD-10-CM | POA: Diagnosis not present

## 2019-03-17 DIAGNOSIS — I4891 Unspecified atrial fibrillation: Secondary | ICD-10-CM

## 2019-03-17 DIAGNOSIS — N2581 Secondary hyperparathyroidism of renal origin: Secondary | ICD-10-CM | POA: Diagnosis not present

## 2019-03-17 DIAGNOSIS — Z23 Encounter for immunization: Secondary | ICD-10-CM | POA: Diagnosis not present

## 2019-03-17 LAB — POCT INR: INR: 2.3 (ref 2.0–3.0)

## 2019-03-17 NOTE — Progress Notes (Signed)
Patient to come in a little bit early because he was having some easy bruising.  His wife was concerned that his INR might actually be too high but it actually looks perfect at 2.5 so we will continue with current regimen and plan to follow-up in 3 weeks.

## 2019-03-17 NOTE — Progress Notes (Signed)
Pt and his wife advised. Follow up scheduled.

## 2019-03-19 DIAGNOSIS — Z23 Encounter for immunization: Secondary | ICD-10-CM | POA: Diagnosis not present

## 2019-03-19 DIAGNOSIS — N186 End stage renal disease: Secondary | ICD-10-CM | POA: Diagnosis not present

## 2019-03-19 DIAGNOSIS — N2581 Secondary hyperparathyroidism of renal origin: Secondary | ICD-10-CM | POA: Diagnosis not present

## 2019-03-19 DIAGNOSIS — E1122 Type 2 diabetes mellitus with diabetic chronic kidney disease: Secondary | ICD-10-CM | POA: Diagnosis not present

## 2019-03-20 DIAGNOSIS — H35353 Cystoid macular degeneration, bilateral: Secondary | ICD-10-CM | POA: Diagnosis not present

## 2019-03-20 DIAGNOSIS — E113212 Type 2 diabetes mellitus with mild nonproliferative diabetic retinopathy with macular edema, left eye: Secondary | ICD-10-CM | POA: Diagnosis not present

## 2019-03-20 DIAGNOSIS — E113213 Type 2 diabetes mellitus with mild nonproliferative diabetic retinopathy with macular edema, bilateral: Secondary | ICD-10-CM | POA: Diagnosis not present

## 2019-03-21 DIAGNOSIS — E1122 Type 2 diabetes mellitus with diabetic chronic kidney disease: Secondary | ICD-10-CM | POA: Diagnosis not present

## 2019-03-21 DIAGNOSIS — N186 End stage renal disease: Secondary | ICD-10-CM | POA: Diagnosis not present

## 2019-03-21 DIAGNOSIS — Z992 Dependence on renal dialysis: Secondary | ICD-10-CM | POA: Diagnosis not present

## 2019-03-21 DIAGNOSIS — N2581 Secondary hyperparathyroidism of renal origin: Secondary | ICD-10-CM | POA: Diagnosis not present

## 2019-03-24 DIAGNOSIS — E1122 Type 2 diabetes mellitus with diabetic chronic kidney disease: Secondary | ICD-10-CM | POA: Diagnosis not present

## 2019-03-24 DIAGNOSIS — N186 End stage renal disease: Secondary | ICD-10-CM | POA: Diagnosis not present

## 2019-03-24 DIAGNOSIS — N2581 Secondary hyperparathyroidism of renal origin: Secondary | ICD-10-CM | POA: Diagnosis not present

## 2019-03-25 ENCOUNTER — Ambulatory Visit: Payer: Medicare Other

## 2019-03-26 ENCOUNTER — Telehealth: Payer: Self-pay

## 2019-03-26 DIAGNOSIS — N186 End stage renal disease: Secondary | ICD-10-CM | POA: Diagnosis not present

## 2019-03-26 DIAGNOSIS — N2581 Secondary hyperparathyroidism of renal origin: Secondary | ICD-10-CM | POA: Diagnosis not present

## 2019-03-26 DIAGNOSIS — E1122 Type 2 diabetes mellitus with diabetic chronic kidney disease: Secondary | ICD-10-CM | POA: Diagnosis not present

## 2019-03-26 NOTE — Telephone Encounter (Signed)
Left a detailed message for the patient about switching his upcoming appointment to a virtual visit and that he could call our office back and someone will be glad to assist with switching his appointment

## 2019-03-27 DIAGNOSIS — E113211 Type 2 diabetes mellitus with mild nonproliferative diabetic retinopathy with macular edema, right eye: Secondary | ICD-10-CM | POA: Diagnosis not present

## 2019-03-28 DIAGNOSIS — N2581 Secondary hyperparathyroidism of renal origin: Secondary | ICD-10-CM | POA: Diagnosis not present

## 2019-03-28 DIAGNOSIS — N186 End stage renal disease: Secondary | ICD-10-CM | POA: Diagnosis not present

## 2019-03-28 DIAGNOSIS — E1122 Type 2 diabetes mellitus with diabetic chronic kidney disease: Secondary | ICD-10-CM | POA: Diagnosis not present

## 2019-03-31 DIAGNOSIS — E1122 Type 2 diabetes mellitus with diabetic chronic kidney disease: Secondary | ICD-10-CM | POA: Diagnosis not present

## 2019-03-31 DIAGNOSIS — N2581 Secondary hyperparathyroidism of renal origin: Secondary | ICD-10-CM | POA: Diagnosis not present

## 2019-03-31 DIAGNOSIS — N186 End stage renal disease: Secondary | ICD-10-CM | POA: Diagnosis not present

## 2019-04-01 ENCOUNTER — Encounter: Payer: Self-pay | Admitting: Cardiology

## 2019-04-01 ENCOUNTER — Telehealth (INDEPENDENT_AMBULATORY_CARE_PROVIDER_SITE_OTHER): Payer: Medicare Other | Admitting: Cardiology

## 2019-04-01 VITALS — BP 146/77 | HR 65 | Ht 67.0 in | Wt 222.0 lb

## 2019-04-01 DIAGNOSIS — R001 Bradycardia, unspecified: Secondary | ICD-10-CM

## 2019-04-01 DIAGNOSIS — I5032 Chronic diastolic (congestive) heart failure: Secondary | ICD-10-CM | POA: Diagnosis not present

## 2019-04-01 DIAGNOSIS — E785 Hyperlipidemia, unspecified: Secondary | ICD-10-CM

## 2019-04-01 DIAGNOSIS — I2510 Atherosclerotic heart disease of native coronary artery without angina pectoris: Secondary | ICD-10-CM | POA: Diagnosis not present

## 2019-04-01 DIAGNOSIS — E7800 Pure hypercholesterolemia, unspecified: Secondary | ICD-10-CM | POA: Diagnosis not present

## 2019-04-01 DIAGNOSIS — I480 Paroxysmal atrial fibrillation: Secondary | ICD-10-CM | POA: Diagnosis not present

## 2019-04-01 DIAGNOSIS — I10 Essential (primary) hypertension: Secondary | ICD-10-CM | POA: Diagnosis not present

## 2019-04-01 MED ORDER — HYDRALAZINE HCL 100 MG PO TABS: 100.0000 mg | ORAL_TABLET | Freq: Three times a day (TID) | ORAL | 3 refills | Status: DC

## 2019-04-01 MED ORDER — ROSUVASTATIN CALCIUM 20 MG PO TABS: 20.0000 mg | ORAL_TABLET | Freq: Every day | ORAL | 3 refills | Status: DC

## 2019-04-01 NOTE — Progress Notes (Signed)
Virtual Visit via Video Note   This visit type was conducted due to national recommendations for restrictions regarding the COVID-19 Pandemic (e.g. social distancing) in an effort to limit this patient's exposure and mitigate transmission in our community.  Due to his co-morbid illnesses, this patient is at least at moderate risk for complications without adequate follow up.  This format is felt to be most appropriate for this patient at this time.  All issues noted in this document were discussed and addressed.  A limited physical exam was performed with this format.  Please refer to the patient's chart for his consent to telehealth for [facility].   Date:  04/01/2019   ID:  Louis [name], DOB 03-24-1946, MRN [id number]  Patient Location: Home Provider Location: Home  PCP:  [name], MD  Cardiologist:  [name], MD   Evaluation Performed:  Follow-Up Visit  Chief Complaint:  FU CAD  History of Present Illness:    FU CAD, diastolic CHF and PAF. 0/93OIZT were normal. Patient admitted March 2018 with syncope. This apparently was felt to be defecation syncope. Toprol decreased due to bradycardia.Carotid dopplers 4/18 showed less than 50% bilaterally.  Pt had dobutamine echo 2/19 prior to renal transplant; no ischemia; developed atrial fibrillation in recovery. Monitor4/19showed recurrent atrial fibrillation. Last echocardiogram January 2020 showed vigorous LV function, moderate left ventricular hypertrophy, moderate left atrial enlargement.  Cardiac catheterization January 2020 showed 60% left main, 50% circumflex, occluded LAD, 50% first diagonal, 60% mid and distal right coronary artery and 60% posterior lateral branch.  FFR of left main 0.95.  Pulmonary capillary wedge pressure 24 with PA pressure 68/23.  Medical therapy recommended.  Patient is now on dialysis.  Renal transplant is being considered.  Apparently he has been evaluated by cardiology there and they feel  intervention will be required prior to renal transplant, either PCI or coronary artery bypass and graft.  Since lastseen patient denies dyspnea, chest pain, palpitations or syncope.  Occasional mild pedal edema.  Past Medical History:  Diagnosis Date  . Brittle bones    per pt, has soft bones in right foot/wears boot cast!  . CAD (coronary artery disease)   . Cataract    Bil/ surg scheduled for right eye 01/18/17/ left eye 02/08/17  . Charcot ankle, right 2019  . CHF (congestive heart failure) ([place]) 2015  . Diabetes mellitus   . Heart failure, diastolic ([place])   . Hyperlipidemia   . Hypertension   . Macular edema 2014  . OSA on CPAP   . Personal history of colonic polyps - adenomas 01/28/2014  . Renal insufficiency   . Shortness of breath dyspnea   . Syncope and collapse    Past Surgical History:  Procedure Laterality Date  . CARPAL TUNNEL RELEASE     left hand  . COLONOSCOPY    . INTRAVASCULAR PRESSURE WIRE/FFR STUDY N/A 12/18/2018   Procedure: INTRAVASCULAR PRESSURE WIRE/FFR STUDY;  Surgeon: Wellington Hampshire, MD;  Location: [place] [facility];  Service: Cardiovascular;  Laterality: N/A;  . IR FLUORO GUIDE CV LINE RIGHT  12/16/2018  . IR US GUIDE VASC ACCESS RIGHT  12/16/2018  . KNEE ARTHROSCOPY Right 09/13/2016   [facility], Dr. Dorna Leitz  . PILONIDAL CYST EXCISION    . RIGHT/LEFT HEART CATH AND CORONARY ANGIOGRAPHY N/A 12/18/2018   Procedure: RIGHT/LEFT HEART CATH AND CORONARY ANGIOGRAPHY;  Surgeon: Wellington Hampshire, MD;  Location: [place] [facility];  Service: Cardiovascular;  Laterality: N/A;  Current Meds  Medication Sig  . allopurinol (ZYLOPRIM) 100 MG tablet Take 1.5 tablets (150 mg total) by mouth 2 (two) times daily.  . AMBULATORY NON FORMULARY MEDICATION Rollator walker with seat. Dx: M25.561, M54.12, M10.00  . AMBULATORY NON FORMULARY MEDICATION Continuous positive airway pressure (CPAP) device: Auto titrate minimum 4 cm H20 to maximum of 20 cm H2O with  pressure. Please provide all supplemental supplies as needed. Fax to: [phone]  . amLODipine (NORVASC) 10 MG tablet Take 1 tablet (10 mg total) by mouth at bedtime. (Patient taking differently: Take 10 mg by mouth daily. )  . B-D INS SYR ULTRAFINE 1CC/31G 31G X 5/16" 1 ML MISC   . Cholecalciferol (VITAMIN D3) 5000 units CAPS Take 5,000 Units by mouth daily.  . Coenzyme Q10 (CO Q 10 PO) Take 1 tablet by mouth daily.  Marland Kitchen gabapentin (NEURONTIN) 600 MG tablet Take 0.5 tablets (300 mg total) by mouth daily.  . hydrALAZINE (APRESOLINE) 100 MG tablet Take 1 tablet (100 mg total) by mouth 3 (three) times daily.  . isosorbide mononitrate (ISMO,MONOKET) 20 MG tablet TAKE 1 TABLET (20 MG TOTAL) BY MOUTH 2 (TWO) TIMES DAILY AT 10 AM AND 5 PM.  . mupirocin ointment (BACTROBAN) 2 % Apply topically 2 (two) times daily. Apply to inside of each nares daily for 10 days then twice a week for maintenance.  . nitroGLYCERIN (NITROSTAT) 0.4 MG SL tablet Place 1 tablet (0.4 mg total) under the tongue every 5 (five) minutes as needed for chest pain.  Marland Kitchen NOVOLIN N 100 UNIT/ML injection Inject 25 Units into the skin 2 (two) times daily before a meal.   . NOVOLIN R 100 UNIT/ML injection Inject 15-20 Units into the skin 3 (three) times daily with meals.   . ONE TOUCH ULTRA TEST test strip USE TO CHECK BLOOD SUGAR 4 TIMES A DAY DX E11.65  . warfarin (COUMADIN) 5 MG tablet Take 1 tablet (5 mg total) by mouth daily at 6 PM. Monitor INR. INR goal should be between 2-3.     Allergies:   Hydrocodone and Oxycodone   Social History   Tobacco Use  . Smoking status: Former Smoker    Packs/day: 0.50    Years: 20.00    Pack years: 10.00    Types: Cigarettes    Last attempt to quit: 03/06/1977    Years since quitting: 42.0  . Smokeless tobacco: Never Used  Substance Use Topics  . Alcohol use: No  . Drug use: No     Family Hx: The patient's family history includes Cancer in his maternal uncle and paternal grandmother;  Coronary artery disease in his father; Diabetes in his brother, father, maternal grandmother, mother, and sister; Heart attack in his paternal grandfather; Kidney disease in his mother. There is no history of Colon cancer.  ROS:   Please see the history of present illness.    No fevers, chills or productive cough. All other systems reviewed and are negative.  Recent Labs: 12/05/2018: ALT 13 12/17/2018: Magnesium 2.2 12/19/2018: BUN 52; Creatinine, Ser 2.34; Hemoglobin 12.8; Platelets 227; Potassium 4.0; Sodium 138   Recent Lipid Panel Lab Results  Component Value Date/Time   CHOL 99 12/05/2018 09:46 AM   TRIG 102 12/05/2018 09:46 AM   HDL 29 (L) 12/05/2018 09:46 AM   CHOLHDL 3.4 12/05/2018 09:46 AM   LDLCALC 51 12/05/2018 09:46 AM    Wt Readings from Last 3 Encounters:  04/01/19 222 lb (100.7 kg)  01/21/19 234 lb (106.1 kg)  01/21/19 234 lb (106.1 kg)     Objective:    Vital Signs:  BP (!) 146/77   Pulse 65   Ht 5\' 7"  (1.702 m)   Wt 222 lb (100.7 kg)   BMI 34.77 kg/m    VITAL SIGNS:  reviewed  No acute distress Normal affect Answers questions appropriately Remainder physical examination not performed (telehealth visit; coronavirus pandemic)  ASSESSMENT & PLAN:    1. Coronary artery disease-patient denies chest pain.  Plan to continue medical therapy.  He is not on aspirin given need for anticoagulation.  Resume statin. 2. Paroxysmal atrial fibrillation-beta-blocker discontinued previously due to bradycardia.  Continue coumadin; goal INR 2-3. 3. Hypertension-patient's blood pressure is elevated; he is only taking hydralazine twice daily.  I have asked him to increase this to 3 times daily.  Follow blood pressure and we will advance regimen as needed. 4. Hyperlipidemia-patient had myalgias with Lipitor.  We will try Crestor 20 mg daily.  Check lipids and liver in 8 weeks. 5. Chronic diastolic congestive heart failure-volume managed by dialysis. 6. ESRD-now on dialysis;  managed by nephrology.  Patient is being evaluated at [facility] for transplant.  He apparently will need coronary intervention prior to transplant.  This will be managed by the cardiologist at their facility.  COVID-19 Education: The importance of social distancing was discussed today.  Time:   Today, I have spent 15 minutes with the patient with telehealth technology discussing the above problems.     Medication Adjustments/Labs and Tests Ordered: Current medicines are reviewed at length with the patient today.  Concerns regarding medicines are outlined above.   Tests Ordered: No orders of the defined types were placed in this encounter.   Medication Changes: No orders of the defined types were placed in this encounter.   Disposition:  Follow up in 4 month(s)  Signed, [name], MD  04/01/2019 8:55 AM    [place]

## 2019-04-01 NOTE — Patient Instructions (Signed)
Medication Instructions:  INCREASE HYDRALAZINE TO 100 MG THREE TIMES DAILY  START ROSUVASTATIN 20 MG ONCE DAILY If you need a refill on your cardiac medications before your next appointment, please call your pharmacy.   Lab work: Your physician recommends that you return for lab work in: [place] If you have labs (blood work) drawn today and your tests are completely normal, you will receive your results only by: Marland Kitchen MyChart Message (if you have MyChart) OR . A paper copy in the mail If you have any lab test that is abnormal or we need to change your treatment, we will call you to review the results.  Follow-Up: Your physician recommends that you schedule a follow-up appointment in: [place]

## 2019-04-02 DIAGNOSIS — N186 End stage renal disease: Secondary | ICD-10-CM | POA: Diagnosis not present

## 2019-04-02 DIAGNOSIS — E1122 Type 2 diabetes mellitus with diabetic chronic kidney disease: Secondary | ICD-10-CM | POA: Diagnosis not present

## 2019-04-02 DIAGNOSIS — N2581 Secondary hyperparathyroidism of renal origin: Secondary | ICD-10-CM | POA: Diagnosis not present

## 2019-04-04 DIAGNOSIS — E1122 Type 2 diabetes mellitus with diabetic chronic kidney disease: Secondary | ICD-10-CM | POA: Diagnosis not present

## 2019-04-04 DIAGNOSIS — N186 End stage renal disease: Secondary | ICD-10-CM | POA: Diagnosis not present

## 2019-04-04 DIAGNOSIS — N2581 Secondary hyperparathyroidism of renal origin: Secondary | ICD-10-CM | POA: Diagnosis not present

## 2019-04-07 DIAGNOSIS — E1122 Type 2 diabetes mellitus with diabetic chronic kidney disease: Secondary | ICD-10-CM | POA: Diagnosis not present

## 2019-04-07 DIAGNOSIS — N186 End stage renal disease: Secondary | ICD-10-CM | POA: Diagnosis not present

## 2019-04-07 DIAGNOSIS — N2581 Secondary hyperparathyroidism of renal origin: Secondary | ICD-10-CM | POA: Diagnosis not present

## 2019-04-08 ENCOUNTER — Encounter: Payer: Self-pay | Admitting: Family Medicine

## 2019-04-08 ENCOUNTER — Ambulatory Visit (INDEPENDENT_AMBULATORY_CARE_PROVIDER_SITE_OTHER): Payer: Medicare Other | Admitting: Family Medicine

## 2019-04-08 DIAGNOSIS — I4891 Unspecified atrial fibrillation: Secondary | ICD-10-CM

## 2019-04-08 LAB — POCT INR: INR: 2.5 (ref 2.0–3.0)

## 2019-04-08 NOTE — Progress Notes (Signed)
Left VM with recommendation. Callback provided for questions and scheduling.

## 2019-04-08 NOTE — Progress Notes (Signed)
No change. Repeat in 4 weeks.

## 2019-04-09 DIAGNOSIS — N2581 Secondary hyperparathyroidism of renal origin: Secondary | ICD-10-CM | POA: Diagnosis not present

## 2019-04-09 DIAGNOSIS — N186 End stage renal disease: Secondary | ICD-10-CM | POA: Diagnosis not present

## 2019-04-09 DIAGNOSIS — E1122 Type 2 diabetes mellitus with diabetic chronic kidney disease: Secondary | ICD-10-CM | POA: Diagnosis not present

## 2019-04-11 DIAGNOSIS — N2581 Secondary hyperparathyroidism of renal origin: Secondary | ICD-10-CM | POA: Diagnosis not present

## 2019-04-11 DIAGNOSIS — E1122 Type 2 diabetes mellitus with diabetic chronic kidney disease: Secondary | ICD-10-CM | POA: Diagnosis not present

## 2019-04-11 DIAGNOSIS — N186 End stage renal disease: Secondary | ICD-10-CM | POA: Diagnosis not present

## 2019-04-14 DIAGNOSIS — E1122 Type 2 diabetes mellitus with diabetic chronic kidney disease: Secondary | ICD-10-CM | POA: Diagnosis not present

## 2019-04-14 DIAGNOSIS — N2581 Secondary hyperparathyroidism of renal origin: Secondary | ICD-10-CM | POA: Diagnosis not present

## 2019-04-14 DIAGNOSIS — N186 End stage renal disease: Secondary | ICD-10-CM | POA: Diagnosis not present

## 2019-04-15 ENCOUNTER — Encounter: Payer: Self-pay | Admitting: Family Medicine

## 2019-04-15 ENCOUNTER — Ambulatory Visit (INDEPENDENT_AMBULATORY_CARE_PROVIDER_SITE_OTHER): Payer: Medicare Other | Admitting: Family Medicine

## 2019-04-15 VITALS — BP 133/64 | HR 65 | Temp 96.8°F | Ht 69.0 in | Wt 221.8 lb

## 2019-04-15 DIAGNOSIS — N184 Chronic kidney disease, stage 4 (severe): Secondary | ICD-10-CM | POA: Diagnosis not present

## 2019-04-15 DIAGNOSIS — N186 End stage renal disease: Secondary | ICD-10-CM | POA: Diagnosis not present

## 2019-04-15 DIAGNOSIS — I2510 Atherosclerotic heart disease of native coronary artery without angina pectoris: Secondary | ICD-10-CM

## 2019-04-15 DIAGNOSIS — I10 Essential (primary) hypertension: Secondary | ICD-10-CM

## 2019-04-15 DIAGNOSIS — E1122 Type 2 diabetes mellitus with diabetic chronic kidney disease: Secondary | ICD-10-CM | POA: Diagnosis not present

## 2019-04-15 DIAGNOSIS — M1A9XX1 Chronic gout, unspecified, with tophus (tophi): Secondary | ICD-10-CM | POA: Diagnosis not present

## 2019-04-15 NOTE — Progress Notes (Signed)
Virtual Visit via Video Note  I connected with Louis [name] on 04/15/19 at  9:10 AM EDT by a video enabled telemedicine application and verified that I am speaking with the correct person using two identifiers.   I discussed the limitations of evaluation and management by telemedicine and the availability of in person appointments. The patient expressed understanding and agreed to proceed.  Pt was at home and I was in my office for the virtual visit.     Subjective:    CC: 4 mo f/u DM  HPI: Diabetes - no hypoglycemic events. No wounds or sores that are not healing well. No increased thirst or urination. Checking glucose at home. Taking medications as prescribed without any side effects. BS:99  CKD 5 awaiting renal transplant-Wanted to know about taking the Hep B immunizations. I informed that he has had the Twinrix last year in January and February. Has appt 6/5 about getting his transplant surgery.  Hypertension- Pt denies chest pain, SOB, dizziness, or heart palpitations.  Taking meds as directed w/o problems.  Denies medication side effects.    F/U Gout - No recent flares or exacerbations.    Past medical history, Surgical history, Family history not pertinant except as noted below, Social history, Allergies, and medications have been entered into the medical record, reviewed, and corrections made.   Review of Systems: No fevers, chills, night sweats, weight loss, chest pain, or shortness of breath.   Objective:    General: Speaking clearly in complete sentences without any shortness of breath.  Alert and oriented x3.  Normal judgment. No apparent acute distress. Well groomed.     Impression and Recommendations:    DM - last a1C was 5.5.  Doing well.   HTN - Well controlled. Continue current regimen. Follow up in  6 months.   ESRD  -currently on dialysis.  Hopefully scheduled for transplant sometime this year. Needs to finish Twinrix series.  Get him scheduled for  drive-by vaccination with our nurse visits.  Gout -due to recheck uric acid level. No recent flares.       I discussed the assessment and treatment plan with the patient. The patient was provided an opportunity to ask questions and all were answered. The patient agreed with the plan and demonstrated an understanding of the instructions.   The patient was advised to call back or seek an in-person evaluation if the symptoms worsen or if the condition fails to improve as anticipated.   [name], MD

## 2019-04-15 NOTE — Progress Notes (Signed)
BS:99 Wanted to know about taking the Hep B immunizations. I informed that he has had the Twinrix last year in January and February.  Has appt 6/5 about getting his transplant surgery.

## 2019-04-16 DIAGNOSIS — E1122 Type 2 diabetes mellitus with diabetic chronic kidney disease: Secondary | ICD-10-CM | POA: Diagnosis not present

## 2019-04-16 DIAGNOSIS — N186 End stage renal disease: Secondary | ICD-10-CM | POA: Diagnosis not present

## 2019-04-16 DIAGNOSIS — N2581 Secondary hyperparathyroidism of renal origin: Secondary | ICD-10-CM | POA: Diagnosis not present

## 2019-04-17 ENCOUNTER — Encounter: Payer: Self-pay | Admitting: Family Medicine

## 2019-04-18 DIAGNOSIS — E1122 Type 2 diabetes mellitus with diabetic chronic kidney disease: Secondary | ICD-10-CM | POA: Diagnosis not present

## 2019-04-18 DIAGNOSIS — N2581 Secondary hyperparathyroidism of renal origin: Secondary | ICD-10-CM | POA: Diagnosis not present

## 2019-04-18 DIAGNOSIS — N186 End stage renal disease: Secondary | ICD-10-CM | POA: Diagnosis not present

## 2019-04-21 DIAGNOSIS — N186 End stage renal disease: Secondary | ICD-10-CM | POA: Diagnosis not present

## 2019-04-21 DIAGNOSIS — N2581 Secondary hyperparathyroidism of renal origin: Secondary | ICD-10-CM | POA: Diagnosis not present

## 2019-04-21 DIAGNOSIS — E1122 Type 2 diabetes mellitus with diabetic chronic kidney disease: Secondary | ICD-10-CM | POA: Diagnosis not present

## 2019-04-21 DIAGNOSIS — Z992 Dependence on renal dialysis: Secondary | ICD-10-CM | POA: Diagnosis not present

## 2019-04-22 NOTE — Telephone Encounter (Signed)
Left pt msg to call back and get scheduled

## 2019-04-23 DIAGNOSIS — E1122 Type 2 diabetes mellitus with diabetic chronic kidney disease: Secondary | ICD-10-CM | POA: Diagnosis not present

## 2019-04-23 DIAGNOSIS — N2581 Secondary hyperparathyroidism of renal origin: Secondary | ICD-10-CM | POA: Diagnosis not present

## 2019-04-23 DIAGNOSIS — N186 End stage renal disease: Secondary | ICD-10-CM | POA: Diagnosis not present

## 2019-04-24 DIAGNOSIS — T82898A Other specified complication of vascular prosthetic devices, implants and grafts, initial encounter: Secondary | ICD-10-CM | POA: Diagnosis not present

## 2019-04-24 DIAGNOSIS — E113213 Type 2 diabetes mellitus with mild nonproliferative diabetic retinopathy with macular edema, bilateral: Secondary | ICD-10-CM | POA: Diagnosis not present

## 2019-04-24 DIAGNOSIS — N186 End stage renal disease: Secondary | ICD-10-CM | POA: Diagnosis not present

## 2019-04-24 DIAGNOSIS — Z992 Dependence on renal dialysis: Secondary | ICD-10-CM | POA: Diagnosis not present

## 2019-04-25 DIAGNOSIS — E785 Hyperlipidemia, unspecified: Secondary | ICD-10-CM | POA: Diagnosis not present

## 2019-04-25 DIAGNOSIS — N2581 Secondary hyperparathyroidism of renal origin: Secondary | ICD-10-CM | POA: Diagnosis not present

## 2019-04-25 DIAGNOSIS — E1122 Type 2 diabetes mellitus with diabetic chronic kidney disease: Secondary | ICD-10-CM | POA: Diagnosis not present

## 2019-04-25 DIAGNOSIS — I10 Essential (primary) hypertension: Secondary | ICD-10-CM | POA: Diagnosis not present

## 2019-04-25 DIAGNOSIS — I2510 Atherosclerotic heart disease of native coronary artery without angina pectoris: Secondary | ICD-10-CM | POA: Diagnosis not present

## 2019-04-25 DIAGNOSIS — I120 Hypertensive chronic kidney disease with stage 5 chronic kidney disease or end stage renal disease: Secondary | ICD-10-CM | POA: Diagnosis not present

## 2019-04-25 DIAGNOSIS — I480 Paroxysmal atrial fibrillation: Secondary | ICD-10-CM | POA: Diagnosis not present

## 2019-04-25 DIAGNOSIS — N186 End stage renal disease: Secondary | ICD-10-CM | POA: Diagnosis not present

## 2019-04-26 DIAGNOSIS — I480 Paroxysmal atrial fibrillation: Secondary | ICD-10-CM | POA: Diagnosis not present

## 2019-04-26 DIAGNOSIS — I2510 Atherosclerotic heart disease of native coronary artery without angina pectoris: Secondary | ICD-10-CM | POA: Diagnosis not present

## 2019-04-26 DIAGNOSIS — I120 Hypertensive chronic kidney disease with stage 5 chronic kidney disease or end stage renal disease: Secondary | ICD-10-CM | POA: Diagnosis not present

## 2019-04-26 DIAGNOSIS — N186 End stage renal disease: Secondary | ICD-10-CM | POA: Diagnosis not present

## 2019-04-26 DIAGNOSIS — E1122 Type 2 diabetes mellitus with diabetic chronic kidney disease: Secondary | ICD-10-CM | POA: Diagnosis not present

## 2019-04-28 DIAGNOSIS — N2581 Secondary hyperparathyroidism of renal origin: Secondary | ICD-10-CM | POA: Diagnosis not present

## 2019-04-28 DIAGNOSIS — E1122 Type 2 diabetes mellitus with diabetic chronic kidney disease: Secondary | ICD-10-CM | POA: Diagnosis not present

## 2019-04-28 DIAGNOSIS — N186 End stage renal disease: Secondary | ICD-10-CM | POA: Diagnosis not present

## 2019-04-29 ENCOUNTER — Other Ambulatory Visit: Payer: Self-pay

## 2019-04-29 ENCOUNTER — Ambulatory Visit (INDEPENDENT_AMBULATORY_CARE_PROVIDER_SITE_OTHER): Payer: Medicare Other | Admitting: Family Medicine

## 2019-04-29 VITALS — Temp 98.0°F

## 2019-04-29 DIAGNOSIS — I4891 Unspecified atrial fibrillation: Secondary | ICD-10-CM | POA: Diagnosis not present

## 2019-04-29 DIAGNOSIS — M1A9XX1 Chronic gout, unspecified, with tophus (tophi): Secondary | ICD-10-CM | POA: Diagnosis not present

## 2019-04-29 DIAGNOSIS — Z23 Encounter for immunization: Secondary | ICD-10-CM

## 2019-04-29 DIAGNOSIS — E1122 Type 2 diabetes mellitus with diabetic chronic kidney disease: Secondary | ICD-10-CM | POA: Diagnosis not present

## 2019-04-29 DIAGNOSIS — N184 Chronic kidney disease, stage 4 (severe): Secondary | ICD-10-CM | POA: Diagnosis not present

## 2019-04-29 LAB — SAR COV2 SEROLOGY (COVID19)AB(IGG),IA: SARS CoV2 AB IGG: NEGATIVE

## 2019-04-29 NOTE — Progress Notes (Signed)
Agree with documentation as above.   Louis Puls, MD  

## 2019-04-29 NOTE — Progress Notes (Signed)
Established Patient Office Visit  Subjective:  Patient ID: Louis [name], male    DOB: 06/04/1946  Age: 73 y.o. MRN: [id number]  CC:  Chief Complaint  Patient presents with  . Immunizations    HPI Louis [name] presents for the last Hepatitis A and B vaccine.   Past Medical History:  Diagnosis Date  . Brittle bones    per pt, has soft bones in right foot/wears boot cast!  . CAD (coronary artery disease)   . Cataract    Bil/ surg scheduled for right eye 01/18/17/ left eye 02/08/17  . Charcot ankle, right 2019  . CHF (congestive heart failure) ([place]) 2015  . Diabetes mellitus   . Heart failure, diastolic ([place])   . Hyperlipidemia   . Hypertension   . Macular edema 2014  . OSA on CPAP   . Personal history of colonic polyps - adenomas 01/28/2014  . Renal insufficiency   . Shortness of breath dyspnea   . Syncope and collapse     Past Surgical History:  Procedure Laterality Date  . CARPAL TUNNEL RELEASE     left hand  . COLONOSCOPY    . INTRAVASCULAR PRESSURE WIRE/FFR STUDY N/A 12/18/2018   Procedure: INTRAVASCULAR PRESSURE WIRE/FFR STUDY;  Surgeon: Wellington Hampshire, MD;  Location: [place] [facility];  Service: Cardiovascular;  Laterality: N/A;  . IR FLUORO GUIDE CV LINE RIGHT  12/16/2018  . IR US GUIDE VASC ACCESS RIGHT  12/16/2018  . KNEE ARTHROSCOPY Right 09/13/2016   [facility], Dr. Dorna Leitz  . PILONIDAL CYST EXCISION    . RIGHT/LEFT HEART CATH AND CORONARY ANGIOGRAPHY N/A 12/18/2018   Procedure: RIGHT/LEFT HEART CATH AND CORONARY ANGIOGRAPHY;  Surgeon: Wellington Hampshire, MD;  Location: [place] [facility];  Service: Cardiovascular;  Laterality: N/A;    Family History  Problem Relation Age of Onset  . Diabetes Mother   . Kidney disease Mother   . Diabetes Father   . Coronary artery disease Father   . Diabetes Brother   . Diabetes Sister   . Cancer Maternal Uncle   . Diabetes Maternal Grandmother   . Cancer Paternal Grandmother   . Heart attack  Paternal Grandfather   . Colon cancer Neg Hx     Social History   Socioeconomic History  . Marital status: Married    Spouse name: [name]  . Number of children: 5  . Years of education: 46  . Highest education level: 12th grade  Occupational History  . Occupation: Warehouse    Comment: retired  Scientific laboratory technician  . Financial resource strain: Not hard at all  . Food insecurity:    Worry: Never true    Inability: Never true  . Transportation needs:    Medical: No    Non-medical: No  Tobacco Use  . Smoking status: Former Smoker    Packs/day: 0.50    Years: 20.00    Pack years: 10.00    Types: Cigarettes    Last attempt to quit: 03/06/1977    Years since quitting: 42.1  . Smokeless tobacco: Never Used  Substance and Sexual Activity  . Alcohol use: No  . Drug use: No  . Sexual activity: Not Currently  Lifestyle  . Physical activity:    Days per week: 0 days    Minutes per session: 0 min  . Stress: Not at all  Relationships  . Social connections:    Talks on phone: Twice a week    Gets together:  Twice a week    Attends religious service: More than 4 times per year    Active member of club or organization: No    Attends meetings of clubs or organizations: Never    Relationship status: Married  . Intimate partner violence:    Fear of current or ex partner: No    Emotionally abused: No    Physically abused: No    Forced sexual activity: No  Other Topics Concern  . Not on file  Social History Narrative   Drinks a cup of coffee daily. Drinks a lot of water daily. Goes out with church members during the week    Outpatient Medications Prior to Visit  Medication Sig Dispense Refill  . allopurinol (ZYLOPRIM) 100 MG tablet Take 1.5 tablets (150 mg total) by mouth 2 (two) times daily. 270 tablet 1  . AMBULATORY NON FORMULARY MEDICATION Rollator walker with seat. Dx: M25.561, M54.12, M10.00 1 each 0  . AMBULATORY NON FORMULARY MEDICATION Continuous positive airway pressure  (CPAP) device: Auto titrate minimum 4 cm H20 to maximum of 20 cm H2O with pressure. Please provide all supplemental supplies as needed. Fax to: [phone] 1 Units 1  . amLODipine (NORVASC) 10 MG tablet Take 1 tablet (10 mg total) by mouth at bedtime. (Patient taking differently: Take 10 mg by mouth daily. ) 90 tablet 3  . B-D INS SYR ULTRAFINE 1CC/31G 31G X 5/16" 1 ML MISC     . Cholecalciferol (VITAMIN D3) 5000 units CAPS Take 5,000 Units by mouth daily.    . Coenzyme Q10 (CO Q 10 PO) Take 1 tablet by mouth daily.    Marland Kitchen gabapentin (NEURONTIN) 600 MG tablet Take 0.5 tablets (300 mg total) by mouth daily. 30 tablet 0  . hydrALAZINE (APRESOLINE) 100 MG tablet Take 1 tablet (100 mg total) by mouth 3 (three) times daily. 270 tablet 3  . isosorbide mononitrate (ISMO,MONOKET) 20 MG tablet TAKE 1 TABLET (20 MG TOTAL) BY MOUTH 2 (TWO) TIMES DAILY AT 10 AM AND 5 PM. 180 tablet 2  . mupirocin ointment (BACTROBAN) 2 % Apply topically 2 (two) times daily. Apply to inside of each nares daily for 10 days then twice a week for maintenance. 30 g 0  . nitroGLYCERIN (NITROSTAT) 0.4 MG SL tablet Place 1 tablet (0.4 mg total) under the tongue every 5 (five) minutes as needed for chest pain. 25 tablet 0  . NOVOLIN N 100 UNIT/ML injection Inject 25 Units into the skin 2 (two) times daily before a meal.     . NOVOLIN R 100 UNIT/ML injection Inject 15-20 Units into the skin 3 (three) times daily with meals.   5  . ONE TOUCH ULTRA TEST test strip USE TO CHECK BLOOD SUGAR 4 TIMES A DAY DX E11.65 300 each 11  . rosuvastatin (CRESTOR) 20 MG tablet Take 1 tablet (20 mg total) by mouth daily. 90 tablet 3  . warfarin (COUMADIN) 5 MG tablet Take 1 tablet (5 mg total) by mouth daily at 6 PM. Monitor INR. INR goal should be between 2-3. 90 tablet 1   No facility-administered medications prior to visit.     Allergies  Allergen Reactions  . Hydrocodone Nausea And Vomiting    Other reaction(s): GI Upset (intolerance) Projectile  vomiting  . Oxycodone Nausea And Vomiting    Other reaction(s): GI Upset (intolerance), Vomiting (intolerance) Projectile vomiting    ROS Review of Systems    Objective:    Physical Exam  Temp 98 F (36.7 C) (  Oral)  Wt Readings from Last 3 Encounters:  04/15/19 221 lb 12.8 oz (100.6 kg)  04/01/19 222 lb (100.7 kg)  01/21/19 234 lb (106.1 kg)     There are no preventive care reminders to display for this patient.  There are no preventive care reminders to display for this patient.  Lab Results  Component Value Date   TSH 2.391 02/02/2017   Lab Results  Component Value Date   WBC 9.4 12/19/2018   HGB 12.8 (L) 12/19/2018   HCT 39.5 12/19/2018   MCV 91.4 12/19/2018   PLT 227 12/19/2018   Lab Results  Component Value Date   NA 138 12/19/2018   K 4.0 12/19/2018   CO2 27 12/19/2018   GLUCOSE 129 (H) 12/19/2018   BUN 52 (H) 12/19/2018   CREATININE 2.34 (H) 12/19/2018   BILITOT 1.5 (H) 12/05/2018   ALKPHOS 98 11/27/2017   AST 18 12/05/2018   ALT 13 12/05/2018   PROT 6.5 12/05/2018   ALBUMIN 3.4 (L) 12/17/2018   CALCIUM 9.1 12/19/2018   ANIONGAP 13 12/19/2018   GFR 38.90 (L) 03/10/2011   Lab Results  Component Value Date   CHOL 99 12/05/2018   Lab Results  Component Value Date   HDL 29 (L) 12/05/2018   Lab Results  Component Value Date   LDLCALC 51 12/05/2018   Lab Results  Component Value Date   TRIG 102 12/05/2018   Lab Results  Component Value Date   CHOLHDL 3.4 12/05/2018   Lab Results  Component Value Date   HGBA1C 5.7 (A) 12/12/2018      Assessment & Plan:  Vaccine - Patient tolerated injection well without complications.    Problem List Items Addressed This Visit    None    Visit Diagnoses    Need for prophylactic vaccination against hepatitis A and hepatitis B in adult    -  Primary   Relevant Orders   Hepatitis A hepatitis B combined vaccine IM (Completed)      No orders of the defined types were placed in this  encounter.   Follow-up: No follow-ups on file.    Lavell Luster, [place]

## 2019-04-30 DIAGNOSIS — N186 End stage renal disease: Secondary | ICD-10-CM | POA: Diagnosis not present

## 2019-04-30 DIAGNOSIS — E1122 Type 2 diabetes mellitus with diabetic chronic kidney disease: Secondary | ICD-10-CM | POA: Diagnosis not present

## 2019-04-30 DIAGNOSIS — N2581 Secondary hyperparathyroidism of renal origin: Secondary | ICD-10-CM | POA: Diagnosis not present

## 2019-04-30 LAB — HEMOGLOBIN A1C
Hgb A1c MFr Bld: 5.5 % of total Hgb (ref <5.7)
Mean Plasma Glucose: 111 (calc)
eAG (mmol/L): 6.2 (calc)

## 2019-04-30 LAB — URIC ACID: Uric Acid, Serum: 2.9 mg/dL — ABNORMAL LOW (ref 4.0–8.0)

## 2019-05-01 DIAGNOSIS — E113213 Type 2 diabetes mellitus with mild nonproliferative diabetic retinopathy with macular edema, bilateral: Secondary | ICD-10-CM | POA: Diagnosis not present

## 2019-05-02 DIAGNOSIS — Z7409 Other reduced mobility: Secondary | ICD-10-CM | POA: Insufficient documentation

## 2019-05-02 DIAGNOSIS — N186 End stage renal disease: Secondary | ICD-10-CM | POA: Diagnosis not present

## 2019-05-02 DIAGNOSIS — I119 Hypertensive heart disease without heart failure: Secondary | ICD-10-CM | POA: Insufficient documentation

## 2019-05-02 DIAGNOSIS — E1122 Type 2 diabetes mellitus with diabetic chronic kidney disease: Secondary | ICD-10-CM | POA: Diagnosis not present

## 2019-05-02 DIAGNOSIS — N2581 Secondary hyperparathyroidism of renal origin: Secondary | ICD-10-CM | POA: Diagnosis not present

## 2019-05-05 DIAGNOSIS — N186 End stage renal disease: Secondary | ICD-10-CM | POA: Diagnosis not present

## 2019-05-05 DIAGNOSIS — N2581 Secondary hyperparathyroidism of renal origin: Secondary | ICD-10-CM | POA: Diagnosis not present

## 2019-05-05 DIAGNOSIS — E1122 Type 2 diabetes mellitus with diabetic chronic kidney disease: Secondary | ICD-10-CM | POA: Diagnosis not present

## 2019-05-06 ENCOUNTER — Other Ambulatory Visit: Payer: Self-pay

## 2019-05-06 ENCOUNTER — Ambulatory Visit (INDEPENDENT_AMBULATORY_CARE_PROVIDER_SITE_OTHER): Payer: Medicare Other | Admitting: Family Medicine

## 2019-05-06 ENCOUNTER — Other Ambulatory Visit: Payer: Self-pay | Admitting: Cardiology

## 2019-05-06 DIAGNOSIS — I4891 Unspecified atrial fibrillation: Secondary | ICD-10-CM

## 2019-05-06 LAB — POCT INR: INR: 2.5 (ref 2.0–3.0)

## 2019-05-06 NOTE — Progress Notes (Signed)
Left a message with recommendations.  

## 2019-05-07 DIAGNOSIS — E1122 Type 2 diabetes mellitus with diabetic chronic kidney disease: Secondary | ICD-10-CM | POA: Diagnosis not present

## 2019-05-07 DIAGNOSIS — N2581 Secondary hyperparathyroidism of renal origin: Secondary | ICD-10-CM | POA: Diagnosis not present

## 2019-05-07 DIAGNOSIS — N186 End stage renal disease: Secondary | ICD-10-CM | POA: Diagnosis not present

## 2019-05-09 DIAGNOSIS — Z794 Long term (current) use of insulin: Secondary | ICD-10-CM | POA: Diagnosis not present

## 2019-05-09 DIAGNOSIS — R001 Bradycardia, unspecified: Secondary | ICD-10-CM | POA: Diagnosis not present

## 2019-05-09 DIAGNOSIS — E1122 Type 2 diabetes mellitus with diabetic chronic kidney disease: Secondary | ICD-10-CM | POA: Diagnosis not present

## 2019-05-09 DIAGNOSIS — N186 End stage renal disease: Secondary | ICD-10-CM | POA: Diagnosis not present

## 2019-05-09 DIAGNOSIS — I120 Hypertensive chronic kidney disease with stage 5 chronic kidney disease or end stage renal disease: Secondary | ICD-10-CM | POA: Diagnosis not present

## 2019-05-09 DIAGNOSIS — N2581 Secondary hyperparathyroidism of renal origin: Secondary | ICD-10-CM | POA: Diagnosis not present

## 2019-05-09 DIAGNOSIS — Z87891 Personal history of nicotine dependence: Secondary | ICD-10-CM | POA: Diagnosis not present

## 2019-05-09 DIAGNOSIS — Z992 Dependence on renal dialysis: Secondary | ICD-10-CM | POA: Diagnosis not present

## 2019-05-09 DIAGNOSIS — I517 Cardiomegaly: Secondary | ICD-10-CM | POA: Diagnosis not present

## 2019-05-09 DIAGNOSIS — Z6836 Body mass index (BMI) 36.0-36.9, adult: Secondary | ICD-10-CM | POA: Diagnosis not present

## 2019-05-09 DIAGNOSIS — I2510 Atherosclerotic heart disease of native coronary artery without angina pectoris: Secondary | ICD-10-CM | POA: Diagnosis not present

## 2019-05-09 DIAGNOSIS — I480 Paroxysmal atrial fibrillation: Secondary | ICD-10-CM | POA: Diagnosis not present

## 2019-05-12 DIAGNOSIS — N186 End stage renal disease: Secondary | ICD-10-CM | POA: Diagnosis not present

## 2019-05-12 DIAGNOSIS — E1122 Type 2 diabetes mellitus with diabetic chronic kidney disease: Secondary | ICD-10-CM | POA: Diagnosis not present

## 2019-05-12 DIAGNOSIS — N2581 Secondary hyperparathyroidism of renal origin: Secondary | ICD-10-CM | POA: Diagnosis not present

## 2019-05-14 DIAGNOSIS — N2581 Secondary hyperparathyroidism of renal origin: Secondary | ICD-10-CM | POA: Diagnosis not present

## 2019-05-14 DIAGNOSIS — N186 End stage renal disease: Secondary | ICD-10-CM | POA: Diagnosis not present

## 2019-05-14 DIAGNOSIS — E1122 Type 2 diabetes mellitus with diabetic chronic kidney disease: Secondary | ICD-10-CM | POA: Diagnosis not present

## 2019-05-16 DIAGNOSIS — N2581 Secondary hyperparathyroidism of renal origin: Secondary | ICD-10-CM | POA: Diagnosis not present

## 2019-05-16 DIAGNOSIS — E1122 Type 2 diabetes mellitus with diabetic chronic kidney disease: Secondary | ICD-10-CM | POA: Diagnosis not present

## 2019-05-16 DIAGNOSIS — N186 End stage renal disease: Secondary | ICD-10-CM | POA: Diagnosis not present

## 2019-05-19 DIAGNOSIS — N2581 Secondary hyperparathyroidism of renal origin: Secondary | ICD-10-CM | POA: Diagnosis not present

## 2019-05-19 DIAGNOSIS — N186 End stage renal disease: Secondary | ICD-10-CM | POA: Diagnosis not present

## 2019-05-19 DIAGNOSIS — E1122 Type 2 diabetes mellitus with diabetic chronic kidney disease: Secondary | ICD-10-CM | POA: Diagnosis not present

## 2019-05-21 DIAGNOSIS — N2581 Secondary hyperparathyroidism of renal origin: Secondary | ICD-10-CM | POA: Diagnosis not present

## 2019-05-21 DIAGNOSIS — I2510 Atherosclerotic heart disease of native coronary artery without angina pectoris: Secondary | ICD-10-CM | POA: Diagnosis not present

## 2019-05-21 DIAGNOSIS — Z992 Dependence on renal dialysis: Secondary | ICD-10-CM | POA: Diagnosis not present

## 2019-05-21 DIAGNOSIS — E119 Type 2 diabetes mellitus without complications: Secondary | ICD-10-CM | POA: Diagnosis not present

## 2019-05-21 DIAGNOSIS — N186 End stage renal disease: Secondary | ICD-10-CM | POA: Diagnosis not present

## 2019-05-21 DIAGNOSIS — I132 Hypertensive heart and chronic kidney disease with heart failure and with stage 5 chronic kidney disease, or end stage renal disease: Secondary | ICD-10-CM | POA: Diagnosis not present

## 2019-05-21 DIAGNOSIS — E1122 Type 2 diabetes mellitus with diabetic chronic kidney disease: Secondary | ICD-10-CM | POA: Diagnosis not present

## 2019-05-21 DIAGNOSIS — I5032 Chronic diastolic (congestive) heart failure: Secondary | ICD-10-CM | POA: Diagnosis not present

## 2019-05-21 DIAGNOSIS — Z23 Encounter for immunization: Secondary | ICD-10-CM | POA: Diagnosis not present

## 2019-05-21 DIAGNOSIS — I2721 Secondary pulmonary arterial hypertension: Secondary | ICD-10-CM | POA: Diagnosis not present

## 2019-05-21 DIAGNOSIS — Z794 Long term (current) use of insulin: Secondary | ICD-10-CM | POA: Diagnosis not present

## 2019-05-21 DIAGNOSIS — I272 Pulmonary hypertension, unspecified: Secondary | ICD-10-CM | POA: Diagnosis not present

## 2019-05-22 DIAGNOSIS — I272 Pulmonary hypertension, unspecified: Secondary | ICD-10-CM | POA: Diagnosis not present

## 2019-05-22 DIAGNOSIS — E119 Type 2 diabetes mellitus without complications: Secondary | ICD-10-CM | POA: Diagnosis not present

## 2019-05-22 DIAGNOSIS — I132 Hypertensive heart and chronic kidney disease with heart failure and with stage 5 chronic kidney disease, or end stage renal disease: Secondary | ICD-10-CM | POA: Diagnosis not present

## 2019-05-22 DIAGNOSIS — I5032 Chronic diastolic (congestive) heart failure: Secondary | ICD-10-CM | POA: Diagnosis not present

## 2019-05-22 DIAGNOSIS — Z794 Long term (current) use of insulin: Secondary | ICD-10-CM | POA: Diagnosis not present

## 2019-05-22 DIAGNOSIS — I2510 Atherosclerotic heart disease of native coronary artery without angina pectoris: Secondary | ICD-10-CM | POA: Diagnosis not present

## 2019-05-22 DIAGNOSIS — I2721 Secondary pulmonary arterial hypertension: Secondary | ICD-10-CM | POA: Diagnosis not present

## 2019-05-22 DIAGNOSIS — N186 End stage renal disease: Secondary | ICD-10-CM | POA: Diagnosis not present

## 2019-05-23 DIAGNOSIS — E1122 Type 2 diabetes mellitus with diabetic chronic kidney disease: Secondary | ICD-10-CM | POA: Diagnosis not present

## 2019-05-23 DIAGNOSIS — N186 End stage renal disease: Secondary | ICD-10-CM | POA: Diagnosis not present

## 2019-05-23 DIAGNOSIS — Z23 Encounter for immunization: Secondary | ICD-10-CM | POA: Diagnosis not present

## 2019-05-23 DIAGNOSIS — N2581 Secondary hyperparathyroidism of renal origin: Secondary | ICD-10-CM | POA: Diagnosis not present

## 2019-05-26 ENCOUNTER — Ambulatory Visit: Payer: Medicare Other | Admitting: Orthopedic Surgery

## 2019-05-26 DIAGNOSIS — N2581 Secondary hyperparathyroidism of renal origin: Secondary | ICD-10-CM | POA: Diagnosis not present

## 2019-05-26 DIAGNOSIS — N186 End stage renal disease: Secondary | ICD-10-CM | POA: Diagnosis not present

## 2019-05-26 DIAGNOSIS — E1122 Type 2 diabetes mellitus with diabetic chronic kidney disease: Secondary | ICD-10-CM | POA: Diagnosis not present

## 2019-05-26 DIAGNOSIS — Z23 Encounter for immunization: Secondary | ICD-10-CM | POA: Diagnosis not present

## 2019-05-27 ENCOUNTER — Encounter: Payer: Self-pay | Admitting: Orthopedic Surgery

## 2019-05-27 ENCOUNTER — Other Ambulatory Visit: Payer: Self-pay

## 2019-05-27 ENCOUNTER — Ambulatory Visit (INDEPENDENT_AMBULATORY_CARE_PROVIDER_SITE_OTHER): Payer: Medicare Other | Admitting: Orthopedic Surgery

## 2019-05-27 ENCOUNTER — Ambulatory Visit (INDEPENDENT_AMBULATORY_CARE_PROVIDER_SITE_OTHER): Payer: Medicare Other

## 2019-05-27 VITALS — Ht 69.0 in | Wt 228.0 lb

## 2019-05-27 DIAGNOSIS — M25562 Pain in left knee: Secondary | ICD-10-CM

## 2019-05-27 DIAGNOSIS — I2510 Atherosclerotic heart disease of native coronary artery without angina pectoris: Secondary | ICD-10-CM

## 2019-05-27 NOTE — Progress Notes (Signed)
Office Visit Note   Patient: Louis [name]           Date of Birth: 04/17/1946           MRN: [id number] Visit Date: 05/27/2019              Requested by: [name], [address] [place],  [place] [id number] PCP: [name], MD  Chief Complaint  Patient presents with  . Left Knee - Pain      HPI: Patient is a 73 year old gentleman who states he fell on his left knee 10 days ago sustained a rug burn abrasion over the patella.  Patient states he has no pain today does have chronic venous swelling.  Patient states the pain has been resolving since the initial injury.  Assessment & Plan: Visit Diagnoses:  1. Left knee pain, unspecified chronicity     Plan: Recommended strengthening to decrease the stress across the knee joint.  Continue using his walker for support.  Follow-Up Instructions: Return if symptoms worsen or fail to improve.   Ortho Exam  Patient is alert, oriented, no adenopathy, well-dressed, normal affect, normal respiratory effort. Examination patient does have venous stasis swelling he is wearing compression stockings.  There is a scab over the 2 abrasions over the knee.  The patella tracks midline the patella is nontender to palpation no signs of injury to the bipartite patella.  The medial and lateral joint line are nontender to palpation the MCL is nontender to palpation.  Varus and valgus stress as well as anterior drawer are stable.  Medial lateral joint line are nontender to palpation.  There is some calcification of the MCL and this is asymptomatic.  Imaging: Xr Knee 1-2 Views Left  Result Date: 05/27/2019 2 view radiographs of the left knee shows mild joint space narrowing medially.  There is some changes over the medial tibial plateau which appear consistent with some calcification of the MCL.  There is some calcification of the medial and lateral meniscus.  There is a very small bipartite patella without  displacement.  No tibial plateau fracture or distal femur fracture.  No images are attached to the encounter.  Labs: Lab Results  Component Value Date   HGBA1C 5.5 04/29/2019   HGBA1C 5.7 (A) 12/12/2018   HGBA1C 6.5 11/27/2017   ESRSEDRATE 81 (H) 02/17/2016   ESRSEDRATE 71 (H) 04/20/2015   CRP 7.0 (H) 02/17/2016   LABURIC 2.9 (L) 04/29/2019   LABURIC 5.7 12/12/2018   LABURIC 4.6 06/12/2017   GRAMSTAIN Few 04/20/2015   GRAMSTAIN WBC present-both PMN and Mononuclear 04/20/2015   GRAMSTAIN No Organisms Seen 04/20/2015   LABORGA ENTEROCOCCUS SPECIES 01/28/2016     Lab Results  Component Value Date   ALBUMIN 3.4 (L) 12/17/2018   ALBUMIN 3.6 11/27/2016   ALBUMIN 3.7 01/28/2016   LABURIC 2.9 (L) 04/29/2019   LABURIC 5.7 12/12/2018   LABURIC 4.6 06/12/2017    Lab Results  Component Value Date   MG 2.2 12/17/2018   MG 2.4 12/16/2018   MG 2.0 02/02/2017   Lab Results  Component Value Date   VD25OH 55 02/17/2016    No results found for: PREALBUMIN CBC EXTENDED Latest Ref Rng & Units 12/19/2018 12/18/2018 12/18/2018  WBC 4.0 - 10.5 K/uL 9.4 - -  RBC 4.22 - 5.81 MIL/uL 4.32 - -  HGB 13.0 - 17.0 g/dL 12.8(L) 12.9(L) 12.6(L)  HCT 39.0 - 52.0 % 39.5 38.0(L) 37.0(L)  PLT  150 - 400 K/uL 227 - -  NEUTROABS - - - -  LYMPHSABS 850 - 3,900 cells/uL - - -     Body mass index is 33.67 kg/m.  Orders:  Orders Placed This Encounter  Procedures  . XR Knee 1-2 Views Left   No orders of the defined types were placed in this encounter.    Procedures: No procedures performed  Clinical Data: No additional findings.  ROS:  All other systems negative, except as noted in the HPI. Review of Systems  Objective: Vital Signs: Ht 5\' 9"  (1.753 m)   Wt 228 lb (103.4 kg)   BMI 33.67 kg/m   Specialty Comments:  No specialty comments available.  PMFS History: Patient Active Problem List   Diagnosis Date Noted  . Atrial fibrillation ([place]) 12/23/2018  . Anasarca 12/14/2018  .  Acute diastolic CHF (congestive heart failure) ([place]) 12/14/2018  . SOB (shortness of breath) on exertion 12/12/2018  . Bilateral pseudophakia 02/27/2018  . Pre-transplant evaluation for kidney transplant 11/27/2017  . Venous insufficiency (chronic) (peripheral) 09/06/2017  . Diabetic peripheral neuropathy ([place]) 06/12/2017  . Charcot gait 02/02/2017  . Charcot's joint 02/02/2017  . Heart failure, diastolic ([place])   . 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 Diabetic polyneuropathy associated with type 2 diabetes mellitus ([place]) 12/25/2016  . Idiopathic chronic venous hypertension of both lower extremities with inflammation 12/25/2016  . Combined forms of age-related cataract of both eyes 10/19/2016  . Cataract, right 05/18/2016  . Right cervical radiculopathy 05/12/2016  . 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 ([place]) 10/12/2015  . 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 Gout 04/20/2015  . Osteoarthritis of both acromioclavicular joints 12/03/2014  . Left shoulder pain 11/17/2014  . Unstable angina pectoris ([place]) 10/05/2014  . History of colonic polyps 01/28/2014  . Bilateral carpal tunnel syndrome 01/09/2014  . Macular edema, diabetic ([place]) 10/31/2013  . Obesity (BMI 30-39.9) 07/31/2013  . Fatigue 03/06/2012  . Astigmatism 01/11/2012  . Presbyopia 01/11/2012  . CAD (coronary artery disease), native coronary artery 05/17/2011  . Nonspecific abnormal unspecified cardiovascular function study 03/01/2011  . Bradycardia 02/08/2011  . Right knee pain 02/08/2011  . Hyperlipidemia   . RBBB 02/01/2011  . ESRD (end stage renal disease) ([place]) 10/23/2006  . Proteinuria 10/23/2006  . Obstructive sleep apnea 10/05/2006  . Diabetes mellitus with stage 4 chronic kidney disease ([facility]) 08/28/2006  . HYPERTENSION, BENIGN SYSTEMIC 08/28/2006   Past Medical History:  Diagnosis  Date  . Brittle bones    per pt, has soft bones in right foot/wears boot cast!  . CAD (coronary artery disease)   . Cataract    Bil/ surg scheduled for right eye 01/18/17/ left eye 02/08/17  . Charcot ankle, right 2019  . CHF (congestive heart failure) ([place]) 2015  . Diabetes mellitus   . Heart failure, diastolic ([place])   . Hyperlipidemia   . Hypertension   . Macular edema 2014  . OSA on CPAP   . Personal history of colonic polyps - adenomas 01/28/2014  . Renal insufficiency   . Shortness of breath dyspnea   . Syncope and collapse     Family History  Problem Relation Age of Onset  . Diabetes Mother   . Kidney disease Mother   . Diabetes Father   . Coronary artery disease Father   . Diabetes Brother   . Diabetes Sister   .  Cancer Maternal Uncle   . Diabetes Maternal Grandmother   . Cancer Paternal Grandmother   . Heart attack Paternal Grandfather   . Colon cancer Neg Hx     Past Surgical History:  Procedure Laterality Date  . CARPAL TUNNEL RELEASE     left hand  . COLONOSCOPY    . INTRAVASCULAR PRESSURE WIRE/FFR STUDY N/A 12/18/2018   Procedure: INTRAVASCULAR PRESSURE WIRE/FFR STUDY;  Surgeon: Wellington Hampshire, MD;  Location: [facility];  Service: Cardiovascular;  Laterality: N/A;  . IR FLUORO GUIDE CV LINE RIGHT  12/16/2018  . IR US GUIDE VASC ACCESS RIGHT  12/16/2018  . KNEE ARTHROSCOPY Right 09/13/2016   [facility], Dr. Dorna Leitz  . PILONIDAL CYST EXCISION    . RIGHT/LEFT HEART CATH AND CORONARY ANGIOGRAPHY N/A 12/18/2018   Procedure: RIGHT/LEFT HEART CATH AND CORONARY ANGIOGRAPHY;  Surgeon: Wellington Hampshire, MD;  Location: [facility];  Service: Cardiovascular;  Laterality: N/A;   Social History   Occupational History  . Occupation: Warehouse    Comment: retired  Tobacco Use  . Smoking status: Former Smoker    Packs/day: 0.50    Years: 20.00    Pack years: 10.00    Types: Cigarettes    Quit date: 03/06/1977    Years since quitting:  42.2  . Smokeless tobacco: Never Used  Substance and Sexual Activity  . Alcohol use: No  . Drug use: No  . Sexual activity: Not Currently

## 2019-05-28 ENCOUNTER — Encounter: Payer: Self-pay | Admitting: Family Medicine

## 2019-05-28 DIAGNOSIS — Z23 Encounter for immunization: Secondary | ICD-10-CM | POA: Diagnosis not present

## 2019-05-28 DIAGNOSIS — N186 End stage renal disease: Secondary | ICD-10-CM | POA: Diagnosis not present

## 2019-05-28 DIAGNOSIS — N2581 Secondary hyperparathyroidism of renal origin: Secondary | ICD-10-CM | POA: Diagnosis not present

## 2019-05-28 DIAGNOSIS — E1122 Type 2 diabetes mellitus with diabetic chronic kidney disease: Secondary | ICD-10-CM | POA: Diagnosis not present

## 2019-05-29 ENCOUNTER — Other Ambulatory Visit: Payer: Self-pay

## 2019-05-29 ENCOUNTER — Encounter: Payer: Self-pay | Admitting: Family Medicine

## 2019-05-29 ENCOUNTER — Ambulatory Visit (INDEPENDENT_AMBULATORY_CARE_PROVIDER_SITE_OTHER): Payer: Medicare Other | Admitting: Family Medicine

## 2019-05-29 VITALS — BP 142/67 | HR 82 | Ht 69.0 in | Wt 229.0 lb

## 2019-05-29 DIAGNOSIS — I4891 Unspecified atrial fibrillation: Secondary | ICD-10-CM

## 2019-05-29 DIAGNOSIS — K921 Melena: Secondary | ICD-10-CM

## 2019-05-29 DIAGNOSIS — I2510 Atherosclerotic heart disease of native coronary artery without angina pectoris: Secondary | ICD-10-CM

## 2019-05-29 DIAGNOSIS — K625 Hemorrhage of anus and rectum: Secondary | ICD-10-CM

## 2019-05-29 DIAGNOSIS — N186 End stage renal disease: Secondary | ICD-10-CM | POA: Diagnosis not present

## 2019-05-29 DIAGNOSIS — M1460 Charcot's joint, unspecified site: Secondary | ICD-10-CM | POA: Diagnosis not present

## 2019-05-29 DIAGNOSIS — T45515A Adverse effect of anticoagulants, initial encounter: Secondary | ICD-10-CM

## 2019-05-29 DIAGNOSIS — E113213 Type 2 diabetes mellitus with mild nonproliferative diabetic retinopathy with macular edema, bilateral: Secondary | ICD-10-CM | POA: Diagnosis not present

## 2019-05-29 LAB — POCT INR: INR: 1.8 — AB (ref 2.0–3.0)

## 2019-05-29 NOTE — Progress Notes (Signed)
Acute Office Visit  Subjective:    Patient ID: Louis [name], male    DOB: May 23, 1946, 73 y.o.   MRN: [id number]  Chief Complaint  Patient presents with  . Blood In Stools      HPI Patient is in today for blood in his stool.  He noticed it yesterday morning and again this morning.  He said it was bright red and look like it was maybe mixed in with the stool.  His wife actually tried to take a wipe afterwards to wipe the area to see if she could see any active bleeding and she did not see anything.  He has no prior history of hemorrhoids.Marland Kitchen  His wife says she thinks she may have accidentally given him a whole tab of his Coumadin the night before instead of a half a tab which is what he was due for.  Actually called yesterday and we had him hold his Coumadin dose last night until he can come in today just in case he was supratherapeutic.  Note he has been eating a lot of beets over the last week to week and a half.  He denies having to strain with bowel movements or having any hard stools.  GI doc is Dr. [name].  Last colonoscopy was March 2018.  Due for 3-year recall which would be March 2021.  He denies any rectal pain or discomfort pressure or rash.  He denies any abdominal pain or cramping.  No nausea vomiting or recent change in stools.  He also wanted to let me know in regards to his chronic kidney disease that he is not going to be able to get his kidney transplant.  He had a catheterization about a week ago and after that and looking at his coronary artery disease they felt like he would not be a safe candidate for transplant so they are now considering peritoneal dialysis.  This is been a big blow to him emotionally and has been really difficult.  Wife is here with him today and is very supportive.  Past Medical History:  Diagnosis Date  . Brittle bones    per pt, has soft bones in right foot/wears boot cast!  . CAD (coronary artery disease)   . Cataract    Bil/ surg  scheduled for right eye 01/18/17/ left eye 02/08/17  . Charcot ankle, right 2019  . CHF (congestive heart failure) ([facility]) 2015  . Diabetes mellitus   . Heart failure, diastolic ([facility] [facility])   . Hyperlipidemia   . Hypertension   . Macular edema 2014  . OSA on CPAP   . Personal history of colonic polyps - adenomas 01/28/2014  . Renal insufficiency   . Shortness of breath dyspnea   . Syncope and collapse     Past Surgical History:  Procedure Laterality Date  . CARPAL TUNNEL RELEASE     left hand  . COLONOSCOPY    . INTRAVASCULAR PRESSURE WIRE/FFR STUDY N/A 12/18/2018   Procedure: INTRAVASCULAR PRESSURE WIRE/FFR STUDY;  Surgeon: Wellington Hampshire, MD;  Location: [facility];  Service: Cardiovascular;  Laterality: N/A;  . IR FLUORO GUIDE CV LINE RIGHT  12/16/2018  . IR US GUIDE VASC ACCESS RIGHT  12/16/2018  . KNEE ARTHROSCOPY Right 09/13/2016   [facility], Dr. Dorna Leitz  . PILONIDAL CYST EXCISION    . RIGHT/LEFT HEART CATH AND CORONARY ANGIOGRAPHY N/A 12/18/2018   Procedure: RIGHT/LEFT HEART CATH AND CORONARY ANGIOGRAPHY;  Surgeon: Wellington Hampshire, MD;  Location: [facility];  Service: Cardiovascular;  Laterality: N/A;    Family History  Problem Relation Age of Onset  . Diabetes Mother   . Kidney disease Mother   . Diabetes Father   . Coronary artery disease Father   . Diabetes Brother   . Diabetes Sister   . Cancer Maternal Uncle   . Diabetes Maternal Grandmother   . Cancer Paternal Grandmother   . Heart attack Paternal Grandfather   . Colon cancer Neg Hx     Social History   Socioeconomic History  . Marital status: Married    Spouse name: [name]  . Number of children: 5  . Years of education: 3  . Highest education level: 12th grade  Occupational History  . Occupation: Warehouse    Comment: retired  Scientific laboratory technician  . Financial resource strain: Not hard at all  . Food insecurity    Worry: Never true    Inability: Never true  . Transportation needs     Medical: No    Non-medical: No  Tobacco Use  . Smoking status: Former Smoker    Packs/day: 0.50    Years: 20.00    Pack years: 10.00    Types: Cigarettes    Quit date: 03/06/1977    Years since quitting: 42.2  . Smokeless tobacco: Never Used  Substance and Sexual Activity  . Alcohol use: No  . Drug use: No  . Sexual activity: Not Currently  Lifestyle  . Physical activity    Days per week: 0 days    Minutes per session: 0 min  . Stress: Not at all  Relationships  . Social Herbalist on phone: Twice a week    Gets together: Twice a week    Attends religious service: More than 4 times per year    Active member of club or organization: No    Attends meetings of clubs or organizations: Never    Relationship status: Married  . Intimate partner violence    Fear of current or ex partner: No    Emotionally abused: No    Physically abused: No    Forced sexual activity: No  Other Topics Concern  . Not on file  Social History Narrative   Drinks a cup of coffee daily. Drinks a lot of water daily. Goes out with church members during the week    Outpatient Medications Prior to Visit  Medication Sig Dispense Refill  . allopurinol (ZYLOPRIM) 100 MG tablet Take 1.5 tablets (150 mg total) by mouth 2 (two) times daily. 270 tablet 1  . AMBULATORY NON FORMULARY MEDICATION Rollator walker with seat. Dx: M25.561, M54.12, M10.00 1 each 0  . AMBULATORY NON FORMULARY MEDICATION Continuous positive airway pressure (CPAP) device: Auto titrate minimum 4 cm H20 to maximum of 20 cm H2O with pressure. Please provide all supplemental supplies as needed. Fax to: [phone] 1 Units 1  . amLODipine (NORVASC) 10 MG tablet Take 1 tablet (10 mg total) by mouth at bedtime. (Patient taking differently: Take 10 mg by mouth daily. ) 90 tablet 3  . B-D INS SYR ULTRAFINE 1CC/31G 31G X 5/16" 1 ML MISC     . Cholecalciferol (VITAMIN D3) 5000 units CAPS Take 5,000 Units by mouth daily.    . Coenzyme Q10  (CO Q 10 PO) Take 1 tablet by mouth daily.    Marland Kitchen gabapentin (NEURONTIN) 600 MG tablet Take 0.5 tablets (300 mg total) by mouth daily. 30 tablet 0  .  hydrALAZINE (APRESOLINE) 100 MG tablet Take 1 tablet (100 mg total) by mouth 3 (three) times daily. 270 tablet 3  . isosorbide mononitrate (ISMO,MONOKET) 20 MG tablet TAKE 1 TABLET (20 MG TOTAL) BY MOUTH 2 (TWO) TIMES DAILY AT 10 AM AND 5 PM. 180 tablet 2  . mupirocin ointment (BACTROBAN) 2 % Apply topically 2 (two) times daily. Apply to inside of each nares daily for 10 days then twice a week for maintenance. 30 g 0  . nitroGLYCERIN (NITROSTAT) 0.4 MG SL tablet Place 1 tablet (0.4 mg total) under the tongue every 5 (five) minutes as needed for chest pain. 25 tablet 0  . NOVOLIN N 100 UNIT/ML injection Inject 25 Units into the skin 2 (two) times daily before a meal.     . NOVOLIN R 100 UNIT/ML injection Inject 15-20 Units into the skin 3 (three) times daily with meals.   5  . ONE TOUCH ULTRA TEST test strip USE TO CHECK BLOOD SUGAR 4 TIMES A DAY DX E11.65 300 each 11  . rosuvastatin (CRESTOR) 20 MG tablet Take 1 tablet (20 mg total) by mouth daily. 90 tablet 3  . warfarin (COUMADIN) 5 MG tablet Take 1 tablet (5 mg total) by mouth daily at 6 PM. Monitor INR. INR goal should be between 2-3. 90 tablet 1   No facility-administered medications prior to visit.     Allergies  Allergen Reactions  . Hydrocodone Nausea And Vomiting    Other reaction(s): GI Upset (intolerance) Projectile vomiting  . Oxycodone Nausea And Vomiting    Other reaction(s): GI Upset (intolerance), Vomiting (intolerance) Projectile vomiting    ROS     Objective:    Physical Exam  Constitutional: He is oriented to person, place, and time. He appears well-developed and well-nourished.  HENT:  Head: Normocephalic and atraumatic.  Cardiovascular: Normal rate, regular rhythm and normal heart sounds.  Pulmonary/Chest: Effort normal and breath sounds normal.  Abdominal: Soft.  Bowel sounds are normal. He exhibits no distension and no mass. There is no abdominal tenderness. There is no rebound and no guarding.  Neurological: He is alert and oriented to person, place, and time.  Skin: Skin is warm and dry.  Psychiatric: He has a normal mood and affect. His behavior is normal.    BP (!) 142/67   Pulse 82   Ht 5\' 9"  (1.753 m)   Wt 229 lb (103.9 kg)   SpO2 100%   BMI 33.82 kg/m  Wt Readings from Last 3 Encounters:  05/29/19 229 lb (103.9 kg)  05/27/19 228 lb (103.4 kg)  05/06/19 228 lb (103.4 kg)    There are no preventive care reminders to display for this patient.  There are no preventive care reminders to display for this patient.   Lab Results  Component Value Date   TSH 2.391 02/02/2017   Lab Results  Component Value Date   WBC 9.4 12/19/2018   HGB 12.8 (L) 12/19/2018   HCT 39.5 12/19/2018   MCV 91.4 12/19/2018   PLT 227 12/19/2018   Lab Results  Component Value Date   NA 138 12/19/2018   K 4.0 12/19/2018   CO2 27 12/19/2018   GLUCOSE 129 (H) 12/19/2018   BUN 52 (H) 12/19/2018   CREATININE 2.34 (H) 12/19/2018   BILITOT 1.5 (H) 12/05/2018   ALKPHOS 98 11/27/2017   AST 18 12/05/2018   ALT 13 12/05/2018   PROT 6.5 12/05/2018   ALBUMIN 3.4 (L) 12/17/2018   CALCIUM 9.1 12/19/2018  ANIONGAP 13 12/19/2018   GFR 38.90 (L) 03/10/2011   Lab Results  Component Value Date   CHOL 99 12/05/2018   Lab Results  Component Value Date   HDL 29 (L) 12/05/2018   Lab Results  Component Value Date   LDLCALC 51 12/05/2018   Lab Results  Component Value Date   TRIG 102 12/05/2018   Lab Results  Component Value Date   CHOLHDL 3.4 12/05/2018   Lab Results  Component Value Date   HGBA1C 5.5 04/29/2019       Assessment & Plan:   Problem List Items Addressed This Visit      Cardiovascular and Mediastinum   Atrial fibrillation ([place])   Relevant Orders   POCT INR (Completed)     Musculoskeletal and Integument   Charcot's joint      Genitourinary   ESRD (end stage renal disease) ([place])    I know he is quite disappointed that he is not currently a candidate for renal transplant.  He is considering peritoneal dialysis.  Could also consider second opinion if he would like.       Other Visit Diagnoses    Blood in stool    -  Primary   Anticoagulant adverse reaction, initial encounter       Rectal bleed       Relevant Orders   Ambulatory referral to Gastroenterology     Blood in stool-unclear etiology could be as simple as a bleeding hemorrhoid or could be indicative of something more concerning though it was more bright red which is a little reassuring.  He is not having any abdominal tenderness and is not having any pain or rectal discomfort.  INR was actually subtherapeutic so it should not be the cause of his bleeding.  Will refer back to Dr. Carlean Purl for rectal exam.  Patient would prefer to have this done by him.  Anticoagulation-INR is actually subtherapeutic.  Some to have him increase the Coumadin to a whole tab daily and then recheck level next week.  No orders of the defined types were placed in this encounter.    [name], MD

## 2019-05-29 NOTE — Assessment & Plan Note (Signed)
I know he is quite disappointed that he is not currently a candidate for renal transplant.  He is considering peritoneal dialysis.  Could also consider second opinion if he would like.

## 2019-05-29 NOTE — Patient Instructions (Signed)
Increase coumadin to one a day. Recheck next week.

## 2019-05-30 ENCOUNTER — Telehealth: Payer: Self-pay

## 2019-05-30 DIAGNOSIS — N2581 Secondary hyperparathyroidism of renal origin: Secondary | ICD-10-CM | POA: Diagnosis not present

## 2019-05-30 DIAGNOSIS — N186 End stage renal disease: Secondary | ICD-10-CM | POA: Diagnosis not present

## 2019-05-30 DIAGNOSIS — Z23 Encounter for immunization: Secondary | ICD-10-CM | POA: Diagnosis not present

## 2019-05-30 DIAGNOSIS — E1122 Type 2 diabetes mellitus with diabetic chronic kidney disease: Secondary | ICD-10-CM | POA: Diagnosis not present

## 2019-05-30 NOTE — Telephone Encounter (Signed)
Pt wife called and wanted to let Dr Madilyn Fireman know that GI is not able to get pt in until August 6th. Wife wanted to know if OK to wait that long?  She also notes that patient had a small BM this morning and there was some blood "seeping", but no fresh blood visible on wipe

## 2019-06-02 DIAGNOSIS — N2581 Secondary hyperparathyroidism of renal origin: Secondary | ICD-10-CM | POA: Diagnosis not present

## 2019-06-02 DIAGNOSIS — N186 End stage renal disease: Secondary | ICD-10-CM | POA: Diagnosis not present

## 2019-06-02 DIAGNOSIS — E1122 Type 2 diabetes mellitus with diabetic chronic kidney disease: Secondary | ICD-10-CM | POA: Diagnosis not present

## 2019-06-02 DIAGNOSIS — Z23 Encounter for immunization: Secondary | ICD-10-CM | POA: Diagnosis not present

## 2019-06-03 ENCOUNTER — Other Ambulatory Visit: Payer: Self-pay

## 2019-06-03 ENCOUNTER — Ambulatory Visit (INDEPENDENT_AMBULATORY_CARE_PROVIDER_SITE_OTHER): Payer: Medicare Other | Admitting: Family Medicine

## 2019-06-03 ENCOUNTER — Telehealth: Payer: Self-pay | Admitting: Internal Medicine

## 2019-06-03 DIAGNOSIS — I4891 Unspecified atrial fibrillation: Secondary | ICD-10-CM | POA: Diagnosis not present

## 2019-06-03 LAB — POCT INR: INR: 2 (ref 2.0–3.0)

## 2019-06-03 MED ORDER — GABAPENTIN 300 MG PO CAPS: 300.0000 mg | ORAL_CAPSULE | Freq: Every day | ORAL | 1 refills | Status: DC

## 2019-06-03 NOTE — Progress Notes (Signed)
Patient reports that there were only about 2 instances of blood in stool after visit with Dr Madilyn Fireman. They note that they are on the cancellation list for GI, next opening is in August.  Instructed to keep this appt.   Pt wife also asks for a refill on Gabapentin. Reports she is currently cutting the 600 mg tablets in half for patient, wanting RX sent for the 300 mg tablets. I have pended this RX.

## 2019-06-03 NOTE — Telephone Encounter (Signed)
[facility] called and Louis [name] is scheduled on 7/16 at 38 I let his wife know. - CF

## 2019-06-03 NOTE — Telephone Encounter (Signed)
Cindy calling from PCP called requesting sooner appt for pt. He is currently scheduled for 8/6 with Dr. Carlean Purl, nothing sooner available. Pt is on dialysis and having rectal bleeding. Pls call Cindy at [phone]

## 2019-06-03 NOTE — Telephone Encounter (Signed)
Louis [name], can you call and see if we could get him in just a little sooner.  It sounds like he still having some blood in his bowel movements and he is on dialysis I am a bit concerned.

## 2019-06-03 NOTE — Telephone Encounter (Signed)
I called  and they sent a message to triage and will have nurse call me to see if they can get him in sooner. - CF

## 2019-06-03 NOTE — Telephone Encounter (Signed)
Patient has been rescheduled to tomorrow at 3:30.  Cindy notified

## 2019-06-03 NOTE — Patient Instructions (Signed)
Discussed results with PCP. Pt has been advised to keep on same dose of 5 mg QD and return for nurse visit to re-check INR in 2 weeks.

## 2019-06-04 ENCOUNTER — Telehealth: Payer: Self-pay

## 2019-06-04 ENCOUNTER — Encounter

## 2019-06-04 ENCOUNTER — Ambulatory Visit: Payer: Medicare Other | Admitting: Gastroenterology

## 2019-06-04 DIAGNOSIS — N2581 Secondary hyperparathyroidism of renal origin: Secondary | ICD-10-CM | POA: Diagnosis not present

## 2019-06-04 DIAGNOSIS — N186 End stage renal disease: Secondary | ICD-10-CM | POA: Diagnosis not present

## 2019-06-04 DIAGNOSIS — E1122 Type 2 diabetes mellitus with diabetic chronic kidney disease: Secondary | ICD-10-CM | POA: Diagnosis not present

## 2019-06-04 DIAGNOSIS — Z23 Encounter for immunization: Secondary | ICD-10-CM | POA: Diagnosis not present

## 2019-06-04 MED ORDER — ISOSORBIDE MONONITRATE 20 MG PO TABS: 20.0000 mg | ORAL_TABLET | Freq: Two times a day (BID) | ORAL | 2 refills | Status: DC

## 2019-06-04 NOTE — Telephone Encounter (Signed)
Covid-19 screening questions   Do you now or have you had a fever in the last 14 days? No  Do you have any respiratory symptoms of shortness of breath or cough now or in the last 14 days? No  Do you have any family members or close contacts with diagnosed or suspected Covid-19 in the past 14 days? No  Have you been tested for Covid-19 and found to be positive? Was tested by primary for antibodies and came back negative.   Questions were answered by the the wife.

## 2019-06-05 ENCOUNTER — Ambulatory Visit (INDEPENDENT_AMBULATORY_CARE_PROVIDER_SITE_OTHER): Payer: Medicare Other | Admitting: Gastroenterology

## 2019-06-05 ENCOUNTER — Encounter: Payer: Self-pay | Admitting: Gastroenterology

## 2019-06-05 DIAGNOSIS — K648 Other hemorrhoids: Secondary | ICD-10-CM | POA: Diagnosis not present

## 2019-06-05 DIAGNOSIS — K625 Hemorrhage of anus and rectum: Secondary | ICD-10-CM

## 2019-06-05 DIAGNOSIS — I2510 Atherosclerotic heart disease of native coronary artery without angina pectoris: Secondary | ICD-10-CM

## 2019-06-05 MED ORDER — HYDROCORTISONE ACETATE 25 MG RE SUPP: 25.0000 mg | Freq: Every day | RECTAL | 0 refills | Status: DC

## 2019-06-05 MED ORDER — HYDROCORTISONE ACETATE 25 MG RE SUPP: 25.0000 mg | Freq: Every day | RECTAL | 1 refills | Status: DC

## 2019-06-05 NOTE — Patient Instructions (Signed)
We have printed off the following medications to your for you to have on hand for your convenience: Hydrocortisone suppository 25 mg  Please follow up with [place] if your issue reoccurs  Normal BMI (Body Mass Index- based on height and weight) is between 23 and 30. Your BMI today is Body mass index is 36.7 kg/m. Marland Kitchen Please consider follow up  regarding your BMI with your Primary Care Provider.

## 2019-06-05 NOTE — Progress Notes (Addendum)
06/05/2019 [name] [id number] [birth year]   HISTORY OF PRESENT ILLNESS: This is a 73 year old male who is a patient of Dr. Celesta [name].  He has multiple medical problems including coronary artery disease, paroxysmal atrial fibrillation on Coumadin, end-stage renal disease on hemodialysis rejected for transplant listing.  He presents to our office today with his wife for complaints of rectal bleeding.  This occurred about 1.5 to 2 weeks ago and was described as what sounds like small-volume bright red blood in the toilet bowl around the stool.  This obvioulsy occurred with bowel movement and occurred for 2 or 3 more days with bowel movements, but then resolved.  Has not recurred since then.  Denies abdominal pain or rectal pain.  Has not been straining to pass his stools.  Last colonoscopy was March 2018 at which time he was found to have the following:  - Two 5 to 6 mm polyps in the descending colon, in the transverse colon and in the distal transverse colon, removed with a cold snare. Resected and retrieved. - One 10 mm polyp in the rectum, removed with a hot snare. Resected and retrieved. - Diverticulosis in the sigmoid colon. - The examination was otherwise normal on direct and retroflexion views. 3 POLYPS TOTAL MAX 10 MM SIZE - Personal history of colonic polyps. 5 ADENOMAS 2015.  Repeat colonoscopy recommended at 3 year interval.  Past Medical History:  Diagnosis Date  . Brittle bones    per pt, has soft bones in right foot/wears boot cast!  . CAD (coronary artery disease)   . Cataract    Bil/ surg scheduled for right eye 01/18/17/ left eye 02/08/17  . Charcot ankle, right 2019  . CHF (congestive heart failure) ([place]) 2015  . Diabetes mellitus   . Heart failure, diastolic ([place])   . Hyperlipidemia   . Hypertension   . Macular edema 2014  . OSA on CPAP   . Personal history of colonic polyps - adenomas 01/28/2014  . Renal insufficiency    being examined for dialysis for  home dialysis [facility]  . Shortness of breath dyspnea   . Syncope and collapse    Past Surgical History:  Procedure Laterality Date  . CARPAL TUNNEL RELEASE     left hand  . COLONOSCOPY    . INTRAVASCULAR PRESSURE WIRE/FFR STUDY N/A 12/18/2018   Procedure: INTRAVASCULAR PRESSURE WIRE/FFR STUDY;  Surgeon: Wellington Hampshire, MD;  Location: [facility];  Service: Cardiovascular;  Laterality: N/A;  . IR FLUORO GUIDE CV LINE RIGHT  12/16/2018  . IR US GUIDE VASC ACCESS RIGHT  12/16/2018  . KNEE ARTHROSCOPY Right 09/13/2016   [facility], Dr. Dorna Leitz  . PILONIDAL CYST EXCISION    . RIGHT/LEFT HEART CATH AND CORONARY ANGIOGRAPHY N/A 12/18/2018   Procedure: RIGHT/LEFT HEART CATH AND CORONARY ANGIOGRAPHY;  Surgeon: Wellington Hampshire, MD;  Location: [facility];  Service: Cardiovascular;  Laterality: N/A;    reports that he quit smoking about 42 years ago. His smoking use included cigarettes. He has a 10.00 pack-year smoking history. He has never used smokeless tobacco. He reports that he does not drink alcohol or use drugs. family history includes Cancer in his paternal grandmother; Coronary artery disease in his father; Diabetes in his brother, father, maternal grandmother, mother, and sister; Heart attack in his paternal grandfather; Kidney disease in his mother; Prostate cancer in his maternal uncle. Allergies  Allergen Reactions  . Hydrocodone Nausea And Vomiting  Other reaction(s): GI Upset (intolerance) Projectile vomiting  . Oxycodone Nausea And Vomiting    Other reaction(s): GI Upset (intolerance), Vomiting (intolerance) Projectile vomiting      Outpatient Encounter Medications as of 06/05/2019  Medication Sig  . allopurinol (ZYLOPRIM) 100 MG tablet Take 1.5 tablets (150 mg total) by mouth 2 (two) times daily.  . AMBULATORY NON FORMULARY MEDICATION Rollator walker with seat. Dx: M25.561, M54.12, M10.00  . AMBULATORY NON FORMULARY MEDICATION Continuous  positive airway pressure (CPAP) device: Auto titrate minimum 4 cm H20 to maximum of 20 cm H2O with pressure. Please provide all supplemental supplies as needed. Fax to: [phone]  . amLODipine (NORVASC) 10 MG tablet Take 1 tablet (10 mg total) by mouth at bedtime. (Patient taking differently: Take 10 mg by mouth daily. )  . B-D INS SYR ULTRAFINE 1CC/31G 31G X 5/16" 1 ML MISC   . Cholecalciferol (VITAMIN D3) 5000 units CAPS Take 5,000 Units by mouth daily.  . Coenzyme Q10 (CO Q 10 PO) Take 1 tablet by mouth daily.  Marland Kitchen gabapentin (NEURONTIN) 300 MG capsule Take 1 capsule (300 mg total) by mouth daily.  . hydrALAZINE (APRESOLINE) 100 MG tablet Take 1 tablet (100 mg total) by mouth 3 (three) times daily.  . isosorbide mononitrate (ISMO) 20 MG tablet Take 1 tablet (20 mg total) by mouth 2 (two) times daily at 10 AM and 5 PM.  . mupirocin ointment (BACTROBAN) 2 % Apply topically 2 (two) times daily. Apply to inside of each nares daily for 10 days then twice a week for maintenance.  . nitroGLYCERIN (NITROSTAT) 0.4 MG SL tablet Place 1 tablet (0.4 mg total) under the tongue every 5 (five) minutes as needed for chest pain.  Marland Kitchen NOVOLIN N 100 UNIT/ML injection Inject 25 Units into the skin 2 (two) times daily before a meal.   . NOVOLIN R 100 UNIT/ML injection Inject 15-20 Units into the skin 3 (three) times daily with meals.   . ONE TOUCH ULTRA TEST test strip USE TO CHECK BLOOD SUGAR 4 TIMES A DAY DX E11.65  . rosuvastatin (CRESTOR) 40 MG tablet Take 40 mg by mouth daily.  Marland Kitchen warfarin (COUMADIN) 5 MG tablet Take 1 tablet (5 mg total) by mouth daily at 6 PM. Monitor INR. INR goal should be between 2-3.  . [DISCONTINUED] isosorbide dinitrate (ISORDIL) 20 MG tablet TAKE 1 TABLET BY MOUTH TWICE DAILY.TAKE 10:00 IN THE MORNING AND 5:00 IN THE EVENING  . [DISCONTINUED] rosuvastatin (CRESTOR) 20 MG tablet Take 1 tablet (20 mg total) by mouth daily. (Patient not taking: Reported on 06/05/2019)   No  facility-administered encounter medications on file as of 06/05/2019.      REVIEW OF SYSTEMS  : All other systems reviewed and negative except where noted in the History of Present Illness.   PHYSICAL EXAM: BP (!) 124/54   Pulse (!) 59   Temp 97.7 F (36.5 C)   Ht 5\' 6"  (1.676 m)   Wt 227 lb 6 oz (103.1 kg)   BMI 36.70 kg/m  General: Well developed white male in no acute distress Head: Normocephalic and atraumatic Eyes:  Sclerae anicteric, conjunctiva pink. Ears: Normal auditory acuity Lungs: Clear throughout to auscultation; no increased WOB. Heart: Regular rate and rhythm; no M/R/G. Abdomen: Soft, non-distended.  BS present.  Non-tender. Rectal:  No external abnormalities noted.  No masses noted on DRE.  Light brown stool on exam glove, heme negative.  Anoscopy revealed internal hemorrhoids but no stigmata of recent bleeding. Musculoskeletal: Symmetrical  with no gross deformities  Skin: No lesions on visible extremities Extremities: No edema  Neurological: Alert oriented x 4, grossly non-focal Psychological:  Alert and cooperative. Normal mood and affect  ASSESSMENT AND PLAN: *Rectal bleeding: 3 to 4 days of small-volume bright red rectal bleeding with bowel movements about 1.5 to 2 weeks ago.  Resolved and has not recurred.  I suspect that this is likely hemorrhoidal source or outlet bleeding in the setting of anticoagulation.  Last colonoscopy 2.5 years ago.  After thorough exam today I discussed with the patient and his wife.  We have agreed to monitor the situation for now.  If it recurs and becomes an ongoing issue then could reconsider repeating colonoscopy at a later time.  Did give prescription for some hydrocortisone suppositories to use at nighttime for recurrence to see if this shortens duration of any bleeding. *Multiple medical problems including coronary artery disease, paroxysmal atrial fibrillation on Coumadin, end-stage renal disease on hemodialysis.   CC:   [name], *  Agree with Ms. Magic Mohler's management.  Gatha Mayer, MD, [facility]

## 2019-06-06 DIAGNOSIS — Z23 Encounter for immunization: Secondary | ICD-10-CM | POA: Diagnosis not present

## 2019-06-06 DIAGNOSIS — N186 End stage renal disease: Secondary | ICD-10-CM | POA: Diagnosis not present

## 2019-06-06 DIAGNOSIS — N2581 Secondary hyperparathyroidism of renal origin: Secondary | ICD-10-CM | POA: Diagnosis not present

## 2019-06-06 DIAGNOSIS — E1122 Type 2 diabetes mellitus with diabetic chronic kidney disease: Secondary | ICD-10-CM | POA: Diagnosis not present

## 2019-06-09 DIAGNOSIS — Z23 Encounter for immunization: Secondary | ICD-10-CM | POA: Diagnosis not present

## 2019-06-09 DIAGNOSIS — N2581 Secondary hyperparathyroidism of renal origin: Secondary | ICD-10-CM | POA: Diagnosis not present

## 2019-06-09 DIAGNOSIS — E1122 Type 2 diabetes mellitus with diabetic chronic kidney disease: Secondary | ICD-10-CM | POA: Diagnosis not present

## 2019-06-09 DIAGNOSIS — N186 End stage renal disease: Secondary | ICD-10-CM | POA: Diagnosis not present

## 2019-06-11 DIAGNOSIS — E1122 Type 2 diabetes mellitus with diabetic chronic kidney disease: Secondary | ICD-10-CM | POA: Diagnosis not present

## 2019-06-11 DIAGNOSIS — N186 End stage renal disease: Secondary | ICD-10-CM | POA: Diagnosis not present

## 2019-06-11 DIAGNOSIS — Z23 Encounter for immunization: Secondary | ICD-10-CM | POA: Diagnosis not present

## 2019-06-11 DIAGNOSIS — N2581 Secondary hyperparathyroidism of renal origin: Secondary | ICD-10-CM | POA: Diagnosis not present

## 2019-06-13 DIAGNOSIS — N2581 Secondary hyperparathyroidism of renal origin: Secondary | ICD-10-CM | POA: Diagnosis not present

## 2019-06-13 DIAGNOSIS — N186 End stage renal disease: Secondary | ICD-10-CM | POA: Diagnosis not present

## 2019-06-13 DIAGNOSIS — E1122 Type 2 diabetes mellitus with diabetic chronic kidney disease: Secondary | ICD-10-CM | POA: Diagnosis not present

## 2019-06-13 DIAGNOSIS — Z23 Encounter for immunization: Secondary | ICD-10-CM | POA: Diagnosis not present

## 2019-06-16 DIAGNOSIS — N186 End stage renal disease: Secondary | ICD-10-CM | POA: Diagnosis not present

## 2019-06-16 DIAGNOSIS — N2581 Secondary hyperparathyroidism of renal origin: Secondary | ICD-10-CM | POA: Diagnosis not present

## 2019-06-16 DIAGNOSIS — Z23 Encounter for immunization: Secondary | ICD-10-CM | POA: Diagnosis not present

## 2019-06-16 DIAGNOSIS — E1122 Type 2 diabetes mellitus with diabetic chronic kidney disease: Secondary | ICD-10-CM | POA: Diagnosis not present

## 2019-06-17 ENCOUNTER — Other Ambulatory Visit: Payer: Self-pay

## 2019-06-17 ENCOUNTER — Ambulatory Visit (INDEPENDENT_AMBULATORY_CARE_PROVIDER_SITE_OTHER): Payer: Medicare Other | Admitting: Family Medicine

## 2019-06-17 DIAGNOSIS — I4891 Unspecified atrial fibrillation: Secondary | ICD-10-CM

## 2019-06-17 LAB — POCT INR: INR: 3.6 — AB (ref 2.0–3.0)

## 2019-06-17 NOTE — Progress Notes (Signed)
Patient advised of recommendations.  

## 2019-06-17 NOTE — Progress Notes (Signed)
Recheck 2 weeks. See sheet for change in dose

## 2019-06-18 DIAGNOSIS — E1122 Type 2 diabetes mellitus with diabetic chronic kidney disease: Secondary | ICD-10-CM | POA: Diagnosis not present

## 2019-06-18 DIAGNOSIS — Z23 Encounter for immunization: Secondary | ICD-10-CM | POA: Diagnosis not present

## 2019-06-18 DIAGNOSIS — N2581 Secondary hyperparathyroidism of renal origin: Secondary | ICD-10-CM | POA: Diagnosis not present

## 2019-06-18 DIAGNOSIS — N186 End stage renal disease: Secondary | ICD-10-CM | POA: Diagnosis not present

## 2019-06-19 DIAGNOSIS — E113213 Type 2 diabetes mellitus with mild nonproliferative diabetic retinopathy with macular edema, bilateral: Secondary | ICD-10-CM | POA: Diagnosis not present

## 2019-06-20 DIAGNOSIS — N2581 Secondary hyperparathyroidism of renal origin: Secondary | ICD-10-CM | POA: Diagnosis not present

## 2019-06-20 DIAGNOSIS — Z23 Encounter for immunization: Secondary | ICD-10-CM | POA: Diagnosis not present

## 2019-06-20 DIAGNOSIS — E1122 Type 2 diabetes mellitus with diabetic chronic kidney disease: Secondary | ICD-10-CM | POA: Diagnosis not present

## 2019-06-20 DIAGNOSIS — N186 End stage renal disease: Secondary | ICD-10-CM | POA: Diagnosis not present

## 2019-06-21 DIAGNOSIS — N186 End stage renal disease: Secondary | ICD-10-CM | POA: Diagnosis not present

## 2019-06-21 DIAGNOSIS — E1122 Type 2 diabetes mellitus with diabetic chronic kidney disease: Secondary | ICD-10-CM | POA: Diagnosis not present

## 2019-06-21 DIAGNOSIS — Z992 Dependence on renal dialysis: Secondary | ICD-10-CM | POA: Diagnosis not present

## 2019-06-23 DIAGNOSIS — Z992 Dependence on renal dialysis: Secondary | ICD-10-CM | POA: Diagnosis not present

## 2019-06-23 DIAGNOSIS — N186 End stage renal disease: Secondary | ICD-10-CM | POA: Diagnosis not present

## 2019-06-23 DIAGNOSIS — A499 Bacterial infection, unspecified: Secondary | ICD-10-CM | POA: Diagnosis not present

## 2019-06-23 DIAGNOSIS — N2581 Secondary hyperparathyroidism of renal origin: Secondary | ICD-10-CM | POA: Diagnosis not present

## 2019-06-23 DIAGNOSIS — E1122 Type 2 diabetes mellitus with diabetic chronic kidney disease: Secondary | ICD-10-CM | POA: Diagnosis not present

## 2019-06-24 ENCOUNTER — Other Ambulatory Visit: Payer: Self-pay

## 2019-06-24 ENCOUNTER — Ambulatory Visit (INDEPENDENT_AMBULATORY_CARE_PROVIDER_SITE_OTHER): Payer: Medicare Other | Admitting: Family Medicine

## 2019-06-24 DIAGNOSIS — I482 Chronic atrial fibrillation, unspecified: Secondary | ICD-10-CM | POA: Diagnosis not present

## 2019-06-24 LAB — POCT INR: INR: 3 (ref 2.0–3.0)

## 2019-06-24 NOTE — Progress Notes (Signed)
Lab Results  Component Value Date   INR 3.0 06/24/2019   INR 3.6 (A) 06/17/2019   INR 2.0 06/03/2019   Continue current tx

## 2019-06-25 DIAGNOSIS — N186 End stage renal disease: Secondary | ICD-10-CM | POA: Diagnosis not present

## 2019-06-25 DIAGNOSIS — Z992 Dependence on renal dialysis: Secondary | ICD-10-CM | POA: Diagnosis not present

## 2019-06-25 DIAGNOSIS — A499 Bacterial infection, unspecified: Secondary | ICD-10-CM | POA: Diagnosis not present

## 2019-06-25 DIAGNOSIS — E1122 Type 2 diabetes mellitus with diabetic chronic kidney disease: Secondary | ICD-10-CM | POA: Diagnosis not present

## 2019-06-25 DIAGNOSIS — N2581 Secondary hyperparathyroidism of renal origin: Secondary | ICD-10-CM | POA: Diagnosis not present

## 2019-06-26 ENCOUNTER — Telehealth: Payer: Self-pay | Admitting: *Deleted

## 2019-06-26 ENCOUNTER — Ambulatory Visit: Payer: Medicare Other | Admitting: Internal Medicine

## 2019-06-26 MED ORDER — ISOSORBIDE DINITRATE 20 MG PO TABS: 20.0000 mg | ORAL_TABLET | Freq: Two times a day (BID) | ORAL | 3 refills | Status: DC

## 2019-06-26 NOTE — Telephone Encounter (Signed)
-----   Message from Lelon Perla, MD sent at 06/24/2019  5:11 PM EDT ----- Regarding: FW: Epic error  ----- Message ----- From: Willy Eddy, RN Sent: 06/24/2019   4:05 PM EDT To: Lelon Perla, MD Subject: Epic error                                     Good afternoon C[place],   Do to an issue with Epic the refill order did not go through or was auto cancelled.  We are working with Epic and hope to have this issue resolved by 07/10/19.  We apologize for any inconvenience this may cause.  Please place a new order for the medication.  05/06/2019 ISOSORBIDE DINITRATE 20 MG PO TABS   Thank you,  Zebedee Iba, RN, BSN Ambulatory Analyst

## 2019-06-26 NOTE — Telephone Encounter (Signed)
Refill sent to the pharmacy electronically.  

## 2019-06-27 DIAGNOSIS — Z992 Dependence on renal dialysis: Secondary | ICD-10-CM | POA: Diagnosis not present

## 2019-06-27 DIAGNOSIS — E1122 Type 2 diabetes mellitus with diabetic chronic kidney disease: Secondary | ICD-10-CM | POA: Diagnosis not present

## 2019-06-27 DIAGNOSIS — N186 End stage renal disease: Secondary | ICD-10-CM | POA: Diagnosis not present

## 2019-06-27 DIAGNOSIS — N2581 Secondary hyperparathyroidism of renal origin: Secondary | ICD-10-CM | POA: Diagnosis not present

## 2019-06-27 DIAGNOSIS — A499 Bacterial infection, unspecified: Secondary | ICD-10-CM | POA: Diagnosis not present

## 2019-06-30 ENCOUNTER — Encounter: Payer: Self-pay | Admitting: Family Medicine

## 2019-06-30 DIAGNOSIS — A499 Bacterial infection, unspecified: Secondary | ICD-10-CM | POA: Diagnosis not present

## 2019-06-30 DIAGNOSIS — N186 End stage renal disease: Secondary | ICD-10-CM | POA: Diagnosis not present

## 2019-06-30 DIAGNOSIS — Z992 Dependence on renal dialysis: Secondary | ICD-10-CM | POA: Diagnosis not present

## 2019-06-30 DIAGNOSIS — E1122 Type 2 diabetes mellitus with diabetic chronic kidney disease: Secondary | ICD-10-CM | POA: Diagnosis not present

## 2019-06-30 DIAGNOSIS — N2581 Secondary hyperparathyroidism of renal origin: Secondary | ICD-10-CM | POA: Diagnosis not present

## 2019-07-02 DIAGNOSIS — N2581 Secondary hyperparathyroidism of renal origin: Secondary | ICD-10-CM | POA: Diagnosis not present

## 2019-07-02 DIAGNOSIS — N186 End stage renal disease: Secondary | ICD-10-CM | POA: Diagnosis not present

## 2019-07-02 DIAGNOSIS — E1122 Type 2 diabetes mellitus with diabetic chronic kidney disease: Secondary | ICD-10-CM | POA: Diagnosis not present

## 2019-07-02 DIAGNOSIS — A499 Bacterial infection, unspecified: Secondary | ICD-10-CM | POA: Diagnosis not present

## 2019-07-02 DIAGNOSIS — Z992 Dependence on renal dialysis: Secondary | ICD-10-CM | POA: Diagnosis not present

## 2019-07-03 DIAGNOSIS — E113213 Type 2 diabetes mellitus with mild nonproliferative diabetic retinopathy with macular edema, bilateral: Secondary | ICD-10-CM | POA: Diagnosis not present

## 2019-07-03 NOTE — Telephone Encounter (Signed)
Wife states that patient has dialysis Monday, wednesday and Friday. August the 18th is a Tuesday but nothing open or available. Wife would like to come that day in the morning. Please contact and advise with what can be done.

## 2019-07-03 NOTE — Telephone Encounter (Signed)
OK to use HOLD visit on nurse schedule on Tuesday.

## 2019-07-04 DIAGNOSIS — A499 Bacterial infection, unspecified: Secondary | ICD-10-CM | POA: Diagnosis not present

## 2019-07-04 DIAGNOSIS — N2581 Secondary hyperparathyroidism of renal origin: Secondary | ICD-10-CM | POA: Diagnosis not present

## 2019-07-04 DIAGNOSIS — E1122 Type 2 diabetes mellitus with diabetic chronic kidney disease: Secondary | ICD-10-CM | POA: Diagnosis not present

## 2019-07-04 DIAGNOSIS — N186 End stage renal disease: Secondary | ICD-10-CM | POA: Diagnosis not present

## 2019-07-04 DIAGNOSIS — Z992 Dependence on renal dialysis: Secondary | ICD-10-CM | POA: Diagnosis not present

## 2019-07-07 DIAGNOSIS — A499 Bacterial infection, unspecified: Secondary | ICD-10-CM | POA: Diagnosis not present

## 2019-07-07 DIAGNOSIS — Z992 Dependence on renal dialysis: Secondary | ICD-10-CM | POA: Diagnosis not present

## 2019-07-07 DIAGNOSIS — N2581 Secondary hyperparathyroidism of renal origin: Secondary | ICD-10-CM | POA: Diagnosis not present

## 2019-07-07 DIAGNOSIS — N186 End stage renal disease: Secondary | ICD-10-CM | POA: Diagnosis not present

## 2019-07-07 DIAGNOSIS — E1122 Type 2 diabetes mellitus with diabetic chronic kidney disease: Secondary | ICD-10-CM | POA: Diagnosis not present

## 2019-07-08 ENCOUNTER — Ambulatory Visit: Payer: Medicare Other | Admitting: Family Medicine

## 2019-07-08 ENCOUNTER — Other Ambulatory Visit: Payer: Self-pay

## 2019-07-08 DIAGNOSIS — I482 Chronic atrial fibrillation, unspecified: Secondary | ICD-10-CM | POA: Diagnosis not present

## 2019-07-08 LAB — POCT INR: INR: 3.1 — AB (ref 2.0–3.0)

## 2019-07-08 NOTE — Telephone Encounter (Signed)
Apolonio Schneiders, can you take a look at this and see if we can accommodate it at all since he does have dialysis 3 days/week.

## 2019-07-08 NOTE — Progress Notes (Signed)
See Coumadin flow sheet

## 2019-07-08 NOTE — Patient Instructions (Signed)
Take 2.5 mg on Tuesday and Friday and 5 mg on Monday, Wednesday, Thursday, Saturday and Sunday. Follow up in 2 weeks for blood pressure and Coumadin check.

## 2019-07-09 DIAGNOSIS — E1122 Type 2 diabetes mellitus with diabetic chronic kidney disease: Secondary | ICD-10-CM | POA: Diagnosis not present

## 2019-07-09 DIAGNOSIS — N186 End stage renal disease: Secondary | ICD-10-CM | POA: Diagnosis not present

## 2019-07-09 DIAGNOSIS — N2581 Secondary hyperparathyroidism of renal origin: Secondary | ICD-10-CM | POA: Diagnosis not present

## 2019-07-09 DIAGNOSIS — A499 Bacterial infection, unspecified: Secondary | ICD-10-CM | POA: Diagnosis not present

## 2019-07-09 DIAGNOSIS — Z992 Dependence on renal dialysis: Secondary | ICD-10-CM | POA: Diagnosis not present

## 2019-07-11 DIAGNOSIS — Z6835 Body mass index (BMI) 35.0-35.9, adult: Secondary | ICD-10-CM | POA: Diagnosis not present

## 2019-07-11 DIAGNOSIS — Z992 Dependence on renal dialysis: Secondary | ICD-10-CM | POA: Diagnosis not present

## 2019-07-11 DIAGNOSIS — Z4902 Encounter for fitting and adjustment of peritoneal dialysis catheter: Secondary | ICD-10-CM | POA: Diagnosis not present

## 2019-07-11 DIAGNOSIS — N186 End stage renal disease: Secondary | ICD-10-CM | POA: Diagnosis not present

## 2019-07-11 DIAGNOSIS — E1122 Type 2 diabetes mellitus with diabetic chronic kidney disease: Secondary | ICD-10-CM | POA: Diagnosis not present

## 2019-07-11 DIAGNOSIS — N2581 Secondary hyperparathyroidism of renal origin: Secondary | ICD-10-CM | POA: Diagnosis not present

## 2019-07-11 DIAGNOSIS — A499 Bacterial infection, unspecified: Secondary | ICD-10-CM | POA: Diagnosis not present

## 2019-07-14 DIAGNOSIS — N2581 Secondary hyperparathyroidism of renal origin: Secondary | ICD-10-CM | POA: Diagnosis not present

## 2019-07-14 DIAGNOSIS — N186 End stage renal disease: Secondary | ICD-10-CM | POA: Diagnosis not present

## 2019-07-14 DIAGNOSIS — A499 Bacterial infection, unspecified: Secondary | ICD-10-CM | POA: Diagnosis not present

## 2019-07-14 DIAGNOSIS — Z992 Dependence on renal dialysis: Secondary | ICD-10-CM | POA: Diagnosis not present

## 2019-07-14 DIAGNOSIS — E1122 Type 2 diabetes mellitus with diabetic chronic kidney disease: Secondary | ICD-10-CM | POA: Diagnosis not present

## 2019-07-16 ENCOUNTER — Other Ambulatory Visit: Payer: Self-pay | Admitting: Family Medicine

## 2019-07-16 DIAGNOSIS — M10031 Idiopathic gout, right wrist: Secondary | ICD-10-CM

## 2019-07-16 DIAGNOSIS — A499 Bacterial infection, unspecified: Secondary | ICD-10-CM | POA: Diagnosis not present

## 2019-07-16 DIAGNOSIS — E1122 Type 2 diabetes mellitus with diabetic chronic kidney disease: Secondary | ICD-10-CM | POA: Diagnosis not present

## 2019-07-16 DIAGNOSIS — N2581 Secondary hyperparathyroidism of renal origin: Secondary | ICD-10-CM | POA: Diagnosis not present

## 2019-07-16 DIAGNOSIS — N186 End stage renal disease: Secondary | ICD-10-CM | POA: Diagnosis not present

## 2019-07-16 DIAGNOSIS — M1000 Idiopathic gout, unspecified site: Secondary | ICD-10-CM

## 2019-07-16 DIAGNOSIS — Z992 Dependence on renal dialysis: Secondary | ICD-10-CM | POA: Diagnosis not present

## 2019-07-17 ENCOUNTER — Other Ambulatory Visit: Payer: Self-pay | Admitting: Family Medicine

## 2019-07-18 DIAGNOSIS — A499 Bacterial infection, unspecified: Secondary | ICD-10-CM | POA: Diagnosis not present

## 2019-07-18 DIAGNOSIS — N186 End stage renal disease: Secondary | ICD-10-CM | POA: Diagnosis not present

## 2019-07-18 DIAGNOSIS — E1122 Type 2 diabetes mellitus with diabetic chronic kidney disease: Secondary | ICD-10-CM | POA: Diagnosis not present

## 2019-07-18 DIAGNOSIS — Z992 Dependence on renal dialysis: Secondary | ICD-10-CM | POA: Diagnosis not present

## 2019-07-18 DIAGNOSIS — N2581 Secondary hyperparathyroidism of renal origin: Secondary | ICD-10-CM | POA: Diagnosis not present

## 2019-07-21 ENCOUNTER — Telehealth: Payer: Self-pay

## 2019-07-21 DIAGNOSIS — N2581 Secondary hyperparathyroidism of renal origin: Secondary | ICD-10-CM | POA: Diagnosis not present

## 2019-07-21 DIAGNOSIS — E1122 Type 2 diabetes mellitus with diabetic chronic kidney disease: Secondary | ICD-10-CM | POA: Diagnosis not present

## 2019-07-21 DIAGNOSIS — A499 Bacterial infection, unspecified: Secondary | ICD-10-CM | POA: Diagnosis not present

## 2019-07-21 DIAGNOSIS — N186 End stage renal disease: Secondary | ICD-10-CM | POA: Diagnosis not present

## 2019-07-21 DIAGNOSIS — Z992 Dependence on renal dialysis: Secondary | ICD-10-CM | POA: Diagnosis not present

## 2019-07-21 NOTE — Telephone Encounter (Signed)
Added to appointment notes.

## 2019-07-21 NOTE — Telephone Encounter (Signed)
Yes ok for self swab

## 2019-07-21 NOTE — Telephone Encounter (Signed)
Tremar wife called and states he is in need of a COVID-19 due to a procedure in September. She was wanting to know if he could have a self swab during his nurse visit tomorrow. No symptoms or exposure.

## 2019-07-22 ENCOUNTER — Other Ambulatory Visit: Payer: Self-pay

## 2019-07-22 ENCOUNTER — Ambulatory Visit (INDEPENDENT_AMBULATORY_CARE_PROVIDER_SITE_OTHER): Payer: Medicare Other | Admitting: Family Medicine

## 2019-07-22 DIAGNOSIS — N186 End stage renal disease: Secondary | ICD-10-CM | POA: Diagnosis not present

## 2019-07-22 DIAGNOSIS — Z01812 Encounter for preprocedural laboratory examination: Secondary | ICD-10-CM | POA: Diagnosis not present

## 2019-07-22 DIAGNOSIS — E1122 Type 2 diabetes mellitus with diabetic chronic kidney disease: Secondary | ICD-10-CM | POA: Diagnosis not present

## 2019-07-22 DIAGNOSIS — Z992 Dependence on renal dialysis: Secondary | ICD-10-CM | POA: Diagnosis not present

## 2019-07-22 DIAGNOSIS — I482 Chronic atrial fibrillation, unspecified: Secondary | ICD-10-CM | POA: Diagnosis not present

## 2019-07-22 DIAGNOSIS — Z20828 Contact with and (suspected) exposure to other viral communicable diseases: Secondary | ICD-10-CM | POA: Diagnosis not present

## 2019-07-22 DIAGNOSIS — T82898A Other specified complication of vascular prosthetic devices, implants and grafts, initial encounter: Secondary | ICD-10-CM | POA: Diagnosis not present

## 2019-07-22 LAB — POCT INR: INR: 2.1 (ref 2.0–3.0)

## 2019-07-22 NOTE — Progress Notes (Signed)
Patient came into have INR checked prior to his surgery on September 8th, 2020. Last dose of Coumadin was 07/20/2019. INR was 2.1

## 2019-07-22 NOTE — Progress Notes (Signed)
Atrial fibrillation-recheck Coumadin in 3 weeks.  COVID swab pending.  Patient is asymptomatic and just getting tested before a procedure.

## 2019-07-22 NOTE — Progress Notes (Signed)
Patient wife notified of MD instructions. KG LPN

## 2019-07-23 DIAGNOSIS — Z992 Dependence on renal dialysis: Secondary | ICD-10-CM | POA: Diagnosis not present

## 2019-07-23 DIAGNOSIS — N186 End stage renal disease: Secondary | ICD-10-CM | POA: Diagnosis not present

## 2019-07-23 DIAGNOSIS — E1122 Type 2 diabetes mellitus with diabetic chronic kidney disease: Secondary | ICD-10-CM | POA: Diagnosis not present

## 2019-07-23 DIAGNOSIS — N2581 Secondary hyperparathyroidism of renal origin: Secondary | ICD-10-CM | POA: Diagnosis not present

## 2019-07-24 DIAGNOSIS — E113213 Type 2 diabetes mellitus with mild nonproliferative diabetic retinopathy with macular edema, bilateral: Secondary | ICD-10-CM | POA: Diagnosis not present

## 2019-07-25 DIAGNOSIS — N186 End stage renal disease: Secondary | ICD-10-CM | POA: Diagnosis not present

## 2019-07-25 DIAGNOSIS — N2581 Secondary hyperparathyroidism of renal origin: Secondary | ICD-10-CM | POA: Diagnosis not present

## 2019-07-25 DIAGNOSIS — Z992 Dependence on renal dialysis: Secondary | ICD-10-CM | POA: Diagnosis not present

## 2019-07-25 DIAGNOSIS — E1122 Type 2 diabetes mellitus with diabetic chronic kidney disease: Secondary | ICD-10-CM | POA: Diagnosis not present

## 2019-07-28 DIAGNOSIS — Z992 Dependence on renal dialysis: Secondary | ICD-10-CM | POA: Diagnosis not present

## 2019-07-28 DIAGNOSIS — N2581 Secondary hyperparathyroidism of renal origin: Secondary | ICD-10-CM | POA: Diagnosis not present

## 2019-07-28 DIAGNOSIS — E1122 Type 2 diabetes mellitus with diabetic chronic kidney disease: Secondary | ICD-10-CM | POA: Diagnosis not present

## 2019-07-28 DIAGNOSIS — N186 End stage renal disease: Secondary | ICD-10-CM | POA: Diagnosis not present

## 2019-07-29 ENCOUNTER — Telehealth: Payer: Self-pay

## 2019-07-29 DIAGNOSIS — N186 End stage renal disease: Secondary | ICD-10-CM | POA: Diagnosis not present

## 2019-07-29 DIAGNOSIS — E1122 Type 2 diabetes mellitus with diabetic chronic kidney disease: Secondary | ICD-10-CM | POA: Diagnosis not present

## 2019-07-29 DIAGNOSIS — Z4902 Encounter for fitting and adjustment of peritoneal dialysis catheter: Secondary | ICD-10-CM | POA: Diagnosis not present

## 2019-07-29 DIAGNOSIS — Z6835 Body mass index (BMI) 35.0-35.9, adult: Secondary | ICD-10-CM | POA: Diagnosis not present

## 2019-07-29 DIAGNOSIS — Z992 Dependence on renal dialysis: Secondary | ICD-10-CM | POA: Diagnosis not present

## 2019-07-29 NOTE — Telephone Encounter (Signed)
Louis [name]'s wife called and states he has had the surgery and will need instructions to go back on the coumadin.

## 2019-07-29 NOTE — Telephone Encounter (Signed)
Patient's wife advised and patient scheduled.  

## 2019-07-29 NOTE — Telephone Encounter (Signed)
OK to restart Coumadin today.  Take 1-1/2 tabs today and tomorrow  and then continue with previous regimen like he was taking before he held it.  And then plan to recheck INR in one week.

## 2019-07-30 DIAGNOSIS — N2581 Secondary hyperparathyroidism of renal origin: Secondary | ICD-10-CM | POA: Diagnosis not present

## 2019-07-30 DIAGNOSIS — Z992 Dependence on renal dialysis: Secondary | ICD-10-CM | POA: Diagnosis not present

## 2019-07-30 DIAGNOSIS — N186 End stage renal disease: Secondary | ICD-10-CM | POA: Diagnosis not present

## 2019-07-30 DIAGNOSIS — E1122 Type 2 diabetes mellitus with diabetic chronic kidney disease: Secondary | ICD-10-CM | POA: Diagnosis not present

## 2019-08-01 DIAGNOSIS — Z992 Dependence on renal dialysis: Secondary | ICD-10-CM | POA: Diagnosis not present

## 2019-08-01 DIAGNOSIS — N186 End stage renal disease: Secondary | ICD-10-CM | POA: Diagnosis not present

## 2019-08-01 DIAGNOSIS — E1122 Type 2 diabetes mellitus with diabetic chronic kidney disease: Secondary | ICD-10-CM | POA: Diagnosis not present

## 2019-08-01 DIAGNOSIS — N2581 Secondary hyperparathyroidism of renal origin: Secondary | ICD-10-CM | POA: Diagnosis not present

## 2019-08-04 DIAGNOSIS — N186 End stage renal disease: Secondary | ICD-10-CM | POA: Diagnosis not present

## 2019-08-04 DIAGNOSIS — Z992 Dependence on renal dialysis: Secondary | ICD-10-CM | POA: Diagnosis not present

## 2019-08-04 DIAGNOSIS — N2581 Secondary hyperparathyroidism of renal origin: Secondary | ICD-10-CM | POA: Diagnosis not present

## 2019-08-04 DIAGNOSIS — E1122 Type 2 diabetes mellitus with diabetic chronic kidney disease: Secondary | ICD-10-CM | POA: Diagnosis not present

## 2019-08-05 ENCOUNTER — Ambulatory Visit: Payer: Medicare Other

## 2019-08-05 ENCOUNTER — Other Ambulatory Visit: Payer: Self-pay | Admitting: Family Medicine

## 2019-08-05 ENCOUNTER — Other Ambulatory Visit: Payer: Self-pay

## 2019-08-05 DIAGNOSIS — I482 Chronic atrial fibrillation, unspecified: Secondary | ICD-10-CM

## 2019-08-05 NOTE — Progress Notes (Signed)
INR order placed because the test strips in office were expired. Pt reports taking 2.5mg  on Tues/Fri and 5mg  all other.

## 2019-08-06 DIAGNOSIS — E1122 Type 2 diabetes mellitus with diabetic chronic kidney disease: Secondary | ICD-10-CM | POA: Diagnosis not present

## 2019-08-06 DIAGNOSIS — N186 End stage renal disease: Secondary | ICD-10-CM | POA: Diagnosis not present

## 2019-08-06 DIAGNOSIS — N2581 Secondary hyperparathyroidism of renal origin: Secondary | ICD-10-CM | POA: Diagnosis not present

## 2019-08-06 DIAGNOSIS — Z992 Dependence on renal dialysis: Secondary | ICD-10-CM | POA: Diagnosis not present

## 2019-08-06 LAB — PROTIME-INR
INR: 2.1 — ABNORMAL HIGH
Prothrombin Time: 21 s — ABNORMAL HIGH (ref 9.0–11.5)

## 2019-08-08 DIAGNOSIS — E1122 Type 2 diabetes mellitus with diabetic chronic kidney disease: Secondary | ICD-10-CM | POA: Diagnosis not present

## 2019-08-08 DIAGNOSIS — N186 End stage renal disease: Secondary | ICD-10-CM | POA: Diagnosis not present

## 2019-08-08 DIAGNOSIS — Z992 Dependence on renal dialysis: Secondary | ICD-10-CM | POA: Diagnosis not present

## 2019-08-08 DIAGNOSIS — N2581 Secondary hyperparathyroidism of renal origin: Secondary | ICD-10-CM | POA: Diagnosis not present

## 2019-08-10 NOTE — Progress Notes (Signed)
HPI: FU CAD, diastolic CHF and PAF. 4/19FXTK were normal. Carotid dopplers 4/18 showed less than 50% bilaterally. Monitor4/19showed recurrent atrial fibrillation. Cardiac catheterization January 2020 showed 60% left main, 50% circumflex, occluded LAD, 50% first diagonal, 60% mid and distal right coronary artery and 60% posterior lateral branch.  FFR of left main 0.95.  Pulmonary capillary wedge pressure 24 with PA pressure 68/23.  Medical therapy recommended.  Patient is now on dialysis.  Echocardiogram June 2020 performed at [facility] showed normal LV function, moderate left ventricular hypertrophy, moderate left atrial enlargement and mild mitral regurgitation.  Right heart catheterization [facility] July 2020 showed right atrial pressure of 11, pulmonary artery pressure of 58/20, pulmonary capillary wedge pressure of 24.  Patient turned down for dialysis.  Since lastseen  he has some dyspnea on exertion but no orthopnea, PND, chest pain or syncope.  Current Outpatient Medications  Medication Sig Dispense Refill  . allopurinol (ZYLOPRIM) 100 MG tablet TAKE 1 & 1/2 (ONE & ONE-HALF) TABLETS BY MOUTH TWICE DAILY 270 tablet 0  . AMBULATORY NON FORMULARY MEDICATION Rollator walker with seat. Dx: M25.561, M54.12, M10.00 1 each 0  . AMBULATORY NON FORMULARY MEDICATION Continuous positive airway pressure (CPAP) device: Auto titrate minimum 4 cm H20 to maximum of 20 cm H2O with pressure. Please provide all supplemental supplies as needed. Fax to: [phone] 1 Units 1  . amLODipine (NORVASC) 10 MG tablet Take 1 tablet (10 mg total) by mouth at bedtime. (Patient taking differently: Take 10 mg by mouth daily. ) 90 tablet 3  . B-D INS SYR ULTRAFINE 1CC/31G 31G X 5/16" 1 ML MISC     . Cholecalciferol (VITAMIN D3) 5000 units CAPS Take 5,000 Units by mouth daily.    . Coenzyme Q10 (CO Q 10 PO) Take 1 tablet by mouth daily.    Marland Kitchen gabapentin (NEURONTIN) 300 MG capsule Take 1 capsule (300 mg total) by  mouth daily. 90 capsule 1  . hydrALAZINE (APRESOLINE) 100 MG tablet Take 1 tablet (100 mg total) by mouth 3 (three) times daily. 270 tablet 3  . isosorbide dinitrate (ISORDIL) 20 MG tablet Take 1 tablet (20 mg total) by mouth 2 (two) times daily. 180 tablet 3  . nitroGLYCERIN (NITROSTAT) 0.4 MG SL tablet Place 1 tablet (0.4 mg total) under the tongue every 5 (five) minutes as needed for chest pain. 25 tablet 0  . NOVOLIN N 100 UNIT/ML injection Inject 25 Units into the skin 2 (two) times daily before a meal.     . NOVOLIN R 100 UNIT/ML injection Inject 15-20 Units into the skin 3 (three) times daily with meals.   5  . ONE TOUCH ULTRA TEST test strip USE TO CHECK BLOOD SUGAR 4 TIMES A DAY DX E11.65 300 each 11  . rosuvastatin (CRESTOR) 40 MG tablet Take 40 mg by mouth daily.    Marland Kitchen warfarin (COUMADIN) 5 MG tablet TAKE 1 TABLET BY MOUTH ONCE DAILY AT 6 PM. MONITOR INR. INR GOAL SHOULD BE BETWEEN 2-3. 90 tablet 0   No current facility-administered medications for this visit.      Past Medical History:  Diagnosis Date  . Brittle bones    per pt, has soft bones in right foot/wears boot cast!  . CAD (coronary artery disease)   . Cataract    Bil/ surg scheduled for right eye 01/18/17/ left eye 02/08/17  . Charcot ankle, right 2019  . CHF (congestive heart failure) ([place]) 2015  . Diabetes mellitus   .  Heart failure, diastolic ([place])   . Hyperlipidemia   . Hypertension   . Macular edema 2014  . OSA on CPAP   . Personal history of colonic polyps - adenomas 01/28/2014  . Renal insufficiency    being examined for dialysis for home dialysis [facility]  . Shortness of breath dyspnea   . Syncope and collapse     Past Surgical History:  Procedure Laterality Date  . CARPAL TUNNEL RELEASE     left hand  . COLONOSCOPY    . INTRAVASCULAR PRESSURE WIRE/FFR STUDY N/A 12/18/2018   Procedure: INTRAVASCULAR PRESSURE WIRE/FFR STUDY;  Surgeon: Wellington Hampshire, MD;  Location: [facility];  Service:  Cardiovascular;  Laterality: N/A;  . IR FLUORO GUIDE CV LINE RIGHT  12/16/2018  . IR US GUIDE VASC ACCESS RIGHT  12/16/2018  . KNEE ARTHROSCOPY Right 09/13/2016   [facility], Dr. Dorna Leitz  . PILONIDAL CYST EXCISION    . RIGHT/LEFT HEART CATH AND CORONARY ANGIOGRAPHY N/A 12/18/2018   Procedure: RIGHT/LEFT HEART CATH AND CORONARY ANGIOGRAPHY;  Surgeon: Wellington Hampshire, MD;  Location: [place] [facility];  Service: Cardiovascular;  Laterality: N/A;    Social History   Socioeconomic History  . Marital status: Married    Spouse name: [name]  . Number of children: 5  . Years of education: 37  . Highest education level: 12th grade  Occupational History  . Occupation: Warehouse    Comment: retired  Scientific laboratory technician  . Financial resource strain: Not hard at all  . Food insecurity    Worry: Never true    Inability: Never true  . Transportation needs    Medical: No    Non-medical: No  Tobacco Use  . Smoking status: Former Smoker    Packs/day: 0.50    Years: 20.00    Pack years: 10.00    Types: Cigarettes    Quit date: 03/06/1977    Years since quitting: 42.4  . Smokeless tobacco: Never Used  Substance and Sexual Activity  . Alcohol use: No  . Drug use: No  . Sexual activity: Not Currently  Lifestyle  . Physical activity    Days per week: 0 days    Minutes per session: 0 min  . Stress: Not at all  Relationships  . Social Herbalist on phone: Twice a week    Gets together: Twice a week    Attends religious service: More than 4 times per year    Active member of club or organization: No    Attends meetings of clubs or organizations: Never    Relationship status: Married  . Intimate partner violence    Fear of current or ex partner: No    Emotionally abused: No    Physically abused: No    Forced sexual activity: No  Other Topics Concern  . Not on file  Social History Narrative   Drinks a cup of coffee daily. Drinks a lot of water daily. Goes out with  church members during the week    Family History  Problem Relation Age of Onset  . Diabetes Mother   . Kidney disease Mother   . Diabetes Father   . Coronary artery disease Father   . Diabetes Brother   . Diabetes Sister   . Prostate cancer Maternal Uncle   . Diabetes Maternal Grandmother   . Cancer Paternal Grandmother        unknown  . Heart attack Paternal Grandfather   .  Colon cancer Neg Hx     ROS: no fevers or chills, productive cough, hemoptysis, dysphasia, odynophagia, melena, hematochezia, dysuria, hematuria, rash, seizure activity, orthopnea, PND, pedal edema, claudication. Remaining systems are negative.  Physical Exam: Well-developed well-nourished in no acute distress.  Skin is warm and dry.  HEENT is normal.  Neck is supple.  Chest is clear to auscultation with normal expansion.  Cardiovascular exam is irregular Abdominal exam nontender or distended. No masses palpated. Extremities show trace edema. neuro grossly intact  ECG-atrial fibrillation at a rate of 60, lateral T wave inversion.  Personally reviewed  A/P  1 coronary artery disease-patient is not having chest pain.  Plan to continue medical therapy.  Continue statin.  No aspirin given need for anticoagulation.  2 paroxysmal atrial fibrillation-patient's beta-blocker was discontinued previously because of bradycardia.  Continue Coumadin at present dose with goal INR 2-3.  3 hypertension-blood pressure is controlled.  Continue present medications and follow.  4 hyperlipidemia-continue statin.  Check lipids and liver.  5 chronic diastolic congestive heart failure-volume is now managed by dialysis.  6 end-stage renal disease-now on dialysis.  Managed by nephrology.  [name], MD

## 2019-08-11 DIAGNOSIS — E1122 Type 2 diabetes mellitus with diabetic chronic kidney disease: Secondary | ICD-10-CM | POA: Diagnosis not present

## 2019-08-11 DIAGNOSIS — Z992 Dependence on renal dialysis: Secondary | ICD-10-CM | POA: Diagnosis not present

## 2019-08-11 DIAGNOSIS — N2581 Secondary hyperparathyroidism of renal origin: Secondary | ICD-10-CM | POA: Diagnosis not present

## 2019-08-11 DIAGNOSIS — N186 End stage renal disease: Secondary | ICD-10-CM | POA: Diagnosis not present

## 2019-08-13 ENCOUNTER — Ambulatory Visit (INDEPENDENT_AMBULATORY_CARE_PROVIDER_SITE_OTHER): Payer: Medicare Other | Admitting: Cardiology

## 2019-08-13 ENCOUNTER — Encounter: Payer: Self-pay | Admitting: Cardiology

## 2019-08-13 ENCOUNTER — Other Ambulatory Visit: Payer: Self-pay

## 2019-08-13 VITALS — BP 142/65 | HR 57 | Ht 66.5 in | Wt 223.1 lb

## 2019-08-13 DIAGNOSIS — N186 End stage renal disease: Secondary | ICD-10-CM | POA: Diagnosis not present

## 2019-08-13 DIAGNOSIS — Z992 Dependence on renal dialysis: Secondary | ICD-10-CM | POA: Diagnosis not present

## 2019-08-13 DIAGNOSIS — E1122 Type 2 diabetes mellitus with diabetic chronic kidney disease: Secondary | ICD-10-CM | POA: Diagnosis not present

## 2019-08-13 DIAGNOSIS — E7800 Pure hypercholesterolemia, unspecified: Secondary | ICD-10-CM | POA: Diagnosis not present

## 2019-08-13 DIAGNOSIS — N2581 Secondary hyperparathyroidism of renal origin: Secondary | ICD-10-CM | POA: Diagnosis not present

## 2019-08-13 DIAGNOSIS — I2510 Atherosclerotic heart disease of native coronary artery without angina pectoris: Secondary | ICD-10-CM | POA: Diagnosis not present

## 2019-08-13 DIAGNOSIS — I480 Paroxysmal atrial fibrillation: Secondary | ICD-10-CM | POA: Diagnosis not present

## 2019-08-13 DIAGNOSIS — I10 Essential (primary) hypertension: Secondary | ICD-10-CM

## 2019-08-13 NOTE — Patient Instructions (Signed)
Medication Instructions:  NO CHANGE If you need a refill on your cardiac medications before your next appointment, please call your pharmacy.   Lab work: Your physician recommends that you return for lab work PRIOR TO EATING  If you have labs (blood work) drawn today and your tests are completely normal, you will receive your results only by: Marland Kitchen MyChart Message (if you have MyChart) OR . A paper copy in the mail If you have any lab test that is abnormal or we need to change your treatment, we will call you to review the results.  Follow-Up: At [facility], you and your health needs are our priority.  As part of our continuing mission to provide you with exceptional heart care, we have created designated Provider Care Teams.  These Care Teams include your primary Cardiologist (physician) and Advanced Practice Providers (APPs -  Physician Assistants and Nurse Practitioners) who all work together to provide you with the care you need, when you need it. Your physician wants you to follow-up in: [place] will receive a reminder letter in the mail two months in advance. If you don't receive a letter, please call our office to schedule the follow-up appointment.

## 2019-08-15 DIAGNOSIS — N2581 Secondary hyperparathyroidism of renal origin: Secondary | ICD-10-CM | POA: Diagnosis not present

## 2019-08-15 DIAGNOSIS — Z992 Dependence on renal dialysis: Secondary | ICD-10-CM | POA: Diagnosis not present

## 2019-08-15 DIAGNOSIS — N186 End stage renal disease: Secondary | ICD-10-CM | POA: Diagnosis not present

## 2019-08-15 DIAGNOSIS — E1122 Type 2 diabetes mellitus with diabetic chronic kidney disease: Secondary | ICD-10-CM | POA: Diagnosis not present

## 2019-08-18 DIAGNOSIS — N2581 Secondary hyperparathyroidism of renal origin: Secondary | ICD-10-CM | POA: Diagnosis not present

## 2019-08-18 DIAGNOSIS — N186 End stage renal disease: Secondary | ICD-10-CM | POA: Diagnosis not present

## 2019-08-18 DIAGNOSIS — E1122 Type 2 diabetes mellitus with diabetic chronic kidney disease: Secondary | ICD-10-CM | POA: Diagnosis not present

## 2019-08-18 DIAGNOSIS — Z992 Dependence on renal dialysis: Secondary | ICD-10-CM | POA: Diagnosis not present

## 2019-08-19 ENCOUNTER — Telehealth: Payer: Self-pay

## 2019-08-19 DIAGNOSIS — I2510 Atherosclerotic heart disease of native coronary artery without angina pectoris: Secondary | ICD-10-CM | POA: Diagnosis not present

## 2019-08-19 MED ORDER — ROSUVASTATIN CALCIUM 40 MG PO TABS: 40.0000 mg | ORAL_TABLET | Freq: Every day | ORAL | 3 refills | Status: DC

## 2019-08-19 NOTE — Telephone Encounter (Signed)
Refill sent to the pharmacy electronically.  

## 2019-08-20 DIAGNOSIS — N2581 Secondary hyperparathyroidism of renal origin: Secondary | ICD-10-CM | POA: Diagnosis not present

## 2019-08-20 DIAGNOSIS — N186 End stage renal disease: Secondary | ICD-10-CM | POA: Diagnosis not present

## 2019-08-20 DIAGNOSIS — E1122 Type 2 diabetes mellitus with diabetic chronic kidney disease: Secondary | ICD-10-CM | POA: Diagnosis not present

## 2019-08-20 DIAGNOSIS — Z992 Dependence on renal dialysis: Secondary | ICD-10-CM | POA: Diagnosis not present

## 2019-08-20 LAB — HEPATIC FUNCTION PANEL
AG Ratio: 1.4 (calc) (ref 1.0–2.5)
ALT: 11 U/L (ref 9–46)
AST: 19 U/L (ref 10–35)
Albumin: 3.8 g/dL (ref 3.6–5.1)
Alkaline phosphatase (APISO): 129 U/L (ref 35–144)
Bilirubin, Direct: 0.2 mg/dL (ref 0.0–0.2)
Globulin: 2.8 g/dL (calc) (ref 1.9–3.7)
Indirect Bilirubin: 0.8 mg/dL (calc) (ref 0.2–1.2)
Total Bilirubin: 1 mg/dL (ref 0.2–1.2)
Total Protein: 6.6 g/dL (ref 6.1–8.1)

## 2019-08-20 LAB — LIPID PANEL
Cholesterol: 103 mg/dL (ref <200)
HDL: 37 mg/dL — ABNORMAL LOW (ref >=40)
LDL Cholesterol (Calc): 48 mg/dL (calc)
Non-HDL Cholesterol (Calc): 66 mg/dL (calc) (ref <130)
Total CHOL/HDL Ratio: 2.8 (calc) (ref <5.0)
Triglycerides: 97 mg/dL (ref <150)

## 2019-08-21 DIAGNOSIS — E113213 Type 2 diabetes mellitus with mild nonproliferative diabetic retinopathy with macular edema, bilateral: Secondary | ICD-10-CM | POA: Diagnosis not present

## 2019-08-22 DIAGNOSIS — N2581 Secondary hyperparathyroidism of renal origin: Secondary | ICD-10-CM | POA: Diagnosis not present

## 2019-08-22 DIAGNOSIS — N186 End stage renal disease: Secondary | ICD-10-CM | POA: Diagnosis not present

## 2019-08-22 DIAGNOSIS — Z992 Dependence on renal dialysis: Secondary | ICD-10-CM | POA: Diagnosis not present

## 2019-08-25 DIAGNOSIS — N186 End stage renal disease: Secondary | ICD-10-CM | POA: Diagnosis not present

## 2019-08-25 DIAGNOSIS — Z992 Dependence on renal dialysis: Secondary | ICD-10-CM | POA: Diagnosis not present

## 2019-08-25 DIAGNOSIS — N2581 Secondary hyperparathyroidism of renal origin: Secondary | ICD-10-CM | POA: Diagnosis not present

## 2019-08-26 ENCOUNTER — Ambulatory Visit (INDEPENDENT_AMBULATORY_CARE_PROVIDER_SITE_OTHER): Payer: Medicare Other | Admitting: Family Medicine

## 2019-08-26 ENCOUNTER — Other Ambulatory Visit: Payer: Self-pay

## 2019-08-26 DIAGNOSIS — I4891 Unspecified atrial fibrillation: Secondary | ICD-10-CM | POA: Diagnosis not present

## 2019-08-26 LAB — POCT INR: INR: 2.6 (ref 2.0–3.0)

## 2019-08-26 NOTE — Progress Notes (Signed)
Repeat in 4 weeks.

## 2019-08-26 NOTE — Progress Notes (Signed)
Pt advised and scheduled.

## 2019-08-27 DIAGNOSIS — N2581 Secondary hyperparathyroidism of renal origin: Secondary | ICD-10-CM | POA: Diagnosis not present

## 2019-08-27 DIAGNOSIS — Z79899 Other long term (current) drug therapy: Secondary | ICD-10-CM | POA: Insufficient documentation

## 2019-08-27 DIAGNOSIS — Z992 Dependence on renal dialysis: Secondary | ICD-10-CM | POA: Diagnosis not present

## 2019-08-27 DIAGNOSIS — D631 Anemia in chronic kidney disease: Secondary | ICD-10-CM | POA: Diagnosis not present

## 2019-08-27 DIAGNOSIS — R17 Unspecified jaundice: Secondary | ICD-10-CM | POA: Insufficient documentation

## 2019-08-27 DIAGNOSIS — K769 Liver disease, unspecified: Secondary | ICD-10-CM | POA: Insufficient documentation

## 2019-08-27 DIAGNOSIS — E113213 Type 2 diabetes mellitus with mild nonproliferative diabetic retinopathy with macular edema, bilateral: Secondary | ICD-10-CM | POA: Diagnosis not present

## 2019-08-27 DIAGNOSIS — R82998 Other abnormal findings in urine: Secondary | ICD-10-CM | POA: Diagnosis not present

## 2019-08-27 DIAGNOSIS — E1122 Type 2 diabetes mellitus with diabetic chronic kidney disease: Secondary | ICD-10-CM | POA: Diagnosis not present

## 2019-08-27 DIAGNOSIS — N186 End stage renal disease: Secondary | ICD-10-CM | POA: Diagnosis not present

## 2019-08-28 DIAGNOSIS — Z79899 Other long term (current) drug therapy: Secondary | ICD-10-CM | POA: Diagnosis not present

## 2019-08-28 DIAGNOSIS — E113213 Type 2 diabetes mellitus with mild nonproliferative diabetic retinopathy with macular edema, bilateral: Secondary | ICD-10-CM | POA: Diagnosis not present

## 2019-08-28 DIAGNOSIS — Z992 Dependence on renal dialysis: Secondary | ICD-10-CM | POA: Diagnosis not present

## 2019-08-28 DIAGNOSIS — R17 Unspecified jaundice: Secondary | ICD-10-CM | POA: Diagnosis not present

## 2019-08-28 DIAGNOSIS — N2581 Secondary hyperparathyroidism of renal origin: Secondary | ICD-10-CM | POA: Diagnosis not present

## 2019-08-28 DIAGNOSIS — N186 End stage renal disease: Secondary | ICD-10-CM | POA: Diagnosis not present

## 2019-08-28 DIAGNOSIS — D631 Anemia in chronic kidney disease: Secondary | ICD-10-CM | POA: Diagnosis not present

## 2019-08-29 DIAGNOSIS — D631 Anemia in chronic kidney disease: Secondary | ICD-10-CM | POA: Diagnosis not present

## 2019-08-29 DIAGNOSIS — Z79899 Other long term (current) drug therapy: Secondary | ICD-10-CM | POA: Diagnosis not present

## 2019-08-29 DIAGNOSIS — R17 Unspecified jaundice: Secondary | ICD-10-CM | POA: Diagnosis not present

## 2019-08-29 DIAGNOSIS — N186 End stage renal disease: Secondary | ICD-10-CM | POA: Diagnosis not present

## 2019-08-29 DIAGNOSIS — Z992 Dependence on renal dialysis: Secondary | ICD-10-CM | POA: Diagnosis not present

## 2019-08-29 DIAGNOSIS — N2581 Secondary hyperparathyroidism of renal origin: Secondary | ICD-10-CM | POA: Diagnosis not present

## 2019-09-01 DIAGNOSIS — D509 Iron deficiency anemia, unspecified: Secondary | ICD-10-CM | POA: Insufficient documentation

## 2019-09-01 DIAGNOSIS — N186 End stage renal disease: Secondary | ICD-10-CM | POA: Diagnosis not present

## 2019-09-01 DIAGNOSIS — N2581 Secondary hyperparathyroidism of renal origin: Secondary | ICD-10-CM | POA: Diagnosis not present

## 2019-09-01 DIAGNOSIS — Z992 Dependence on renal dialysis: Secondary | ICD-10-CM | POA: Diagnosis not present

## 2019-09-01 DIAGNOSIS — D631 Anemia in chronic kidney disease: Secondary | ICD-10-CM | POA: Diagnosis not present

## 2019-09-02 DIAGNOSIS — N2581 Secondary hyperparathyroidism of renal origin: Secondary | ICD-10-CM | POA: Diagnosis not present

## 2019-09-02 DIAGNOSIS — D509 Iron deficiency anemia, unspecified: Secondary | ICD-10-CM | POA: Diagnosis not present

## 2019-09-02 DIAGNOSIS — Z992 Dependence on renal dialysis: Secondary | ICD-10-CM | POA: Diagnosis not present

## 2019-09-02 DIAGNOSIS — N186 End stage renal disease: Secondary | ICD-10-CM | POA: Diagnosis not present

## 2019-09-02 DIAGNOSIS — D631 Anemia in chronic kidney disease: Secondary | ICD-10-CM | POA: Diagnosis not present

## 2019-09-03 DIAGNOSIS — N186 End stage renal disease: Secondary | ICD-10-CM | POA: Diagnosis not present

## 2019-09-03 DIAGNOSIS — N2581 Secondary hyperparathyroidism of renal origin: Secondary | ICD-10-CM | POA: Diagnosis not present

## 2019-09-03 DIAGNOSIS — D509 Iron deficiency anemia, unspecified: Secondary | ICD-10-CM | POA: Diagnosis not present

## 2019-09-03 DIAGNOSIS — D631 Anemia in chronic kidney disease: Secondary | ICD-10-CM | POA: Diagnosis not present

## 2019-09-03 DIAGNOSIS — Z992 Dependence on renal dialysis: Secondary | ICD-10-CM | POA: Diagnosis not present

## 2019-09-04 ENCOUNTER — Telehealth: Payer: Self-pay

## 2019-09-04 DIAGNOSIS — D631 Anemia in chronic kidney disease: Secondary | ICD-10-CM | POA: Diagnosis not present

## 2019-09-04 DIAGNOSIS — N186 End stage renal disease: Secondary | ICD-10-CM | POA: Diagnosis not present

## 2019-09-04 DIAGNOSIS — Z992 Dependence on renal dialysis: Secondary | ICD-10-CM | POA: Diagnosis not present

## 2019-09-04 DIAGNOSIS — N2581 Secondary hyperparathyroidism of renal origin: Secondary | ICD-10-CM | POA: Diagnosis not present

## 2019-09-04 DIAGNOSIS — D509 Iron deficiency anemia, unspecified: Secondary | ICD-10-CM | POA: Diagnosis not present

## 2019-09-04 NOTE — Telephone Encounter (Signed)
Louis [name] states the solution he has for his dialysis has glucose and it is causing his blood sugar to elevated. He has been scheduled for Monday. His blood glucose is going up and down. She is going to ask today if there is a different solution.

## 2019-09-05 DIAGNOSIS — N2581 Secondary hyperparathyroidism of renal origin: Secondary | ICD-10-CM | POA: Diagnosis not present

## 2019-09-05 DIAGNOSIS — N186 End stage renal disease: Secondary | ICD-10-CM | POA: Diagnosis not present

## 2019-09-05 DIAGNOSIS — D631 Anemia in chronic kidney disease: Secondary | ICD-10-CM | POA: Diagnosis not present

## 2019-09-05 DIAGNOSIS — D509 Iron deficiency anemia, unspecified: Secondary | ICD-10-CM | POA: Diagnosis not present

## 2019-09-05 DIAGNOSIS — Z992 Dependence on renal dialysis: Secondary | ICD-10-CM | POA: Diagnosis not present

## 2019-09-08 ENCOUNTER — Ambulatory Visit (INDEPENDENT_AMBULATORY_CARE_PROVIDER_SITE_OTHER): Payer: Medicare Other | Admitting: Family Medicine

## 2019-09-08 ENCOUNTER — Ambulatory Visit: Payer: Medicare Other

## 2019-09-08 ENCOUNTER — Encounter: Payer: Self-pay | Admitting: Family Medicine

## 2019-09-08 ENCOUNTER — Other Ambulatory Visit: Payer: Self-pay

## 2019-09-08 VITALS — BP 140/70 | HR 80 | Ht 67.0 in | Wt 235.0 lb

## 2019-09-08 DIAGNOSIS — E1122 Type 2 diabetes mellitus with diabetic chronic kidney disease: Secondary | ICD-10-CM

## 2019-09-08 DIAGNOSIS — I2510 Atherosclerotic heart disease of native coronary artery without angina pectoris: Secondary | ICD-10-CM

## 2019-09-08 DIAGNOSIS — I10 Essential (primary) hypertension: Secondary | ICD-10-CM | POA: Diagnosis not present

## 2019-09-08 DIAGNOSIS — Z6836 Body mass index (BMI) 36.0-36.9, adult: Secondary | ICD-10-CM

## 2019-09-08 DIAGNOSIS — N184 Chronic kidney disease, stage 4 (severe): Secondary | ICD-10-CM | POA: Diagnosis not present

## 2019-09-08 LAB — POCT GLYCOSYLATED HEMOGLOBIN (HGB A1C): Hemoglobin A1C: 5.9 % — AB (ref 4.0–5.6)

## 2019-09-08 NOTE — Assessment & Plan Note (Signed)
*  To send me the blood pressures over my chart so that we can make some adjustments if needed.  Clonidine was recently discontinued.

## 2019-09-08 NOTE — Patient Instructions (Signed)
Try increasing the morning insulin in to 25 units.

## 2019-09-08 NOTE — Progress Notes (Signed)
Established Patient Office Visit  Subjective:  Patient ID: Louis [name], male    DOB: [birth year]  Age: 72 y.o. MRN: [id number]  CC:  Chief Complaint  Patient presents with  . Diabetes    HPI Louis [name] presents for elevated glucose levels.  Recently he has been getting training to peritoneal dialysis with a solution that he uses in his abdomen has glucose and it has been really driving his blood sugar levels.  His wife brought in his glucose log.  Mostly the glucoses have looked great in the morning that there have been a couple lows but the blood sugar goes up to the 200s and even 300s by the evening meal or either at bedtime.  She has been trying to adjust his short acting insulin and long-acting insulin to get a good balance to control it.  And at one point she gave 16 units of R at bedtime and he actually had a hypoglycemic event in the middle of the night.  This was about 2 days ago.  And then this morning he woke up with a glucose of 65 but did not have hypoglycemia in the middle of the night.  She says this time when he woke up he at least was not disoriented as he has been previously with hypoglycemia.  His A1c previously was absolutely phenomenal and he had done a great job and really regulating his glucose.  Dr. Stanford Breed, Cards, stopped his clonidine.  BPs have been high the last 2-3 weeks.  He forgot to bring his home blood pressures.  He has not been as active.    Past Medical History:  Diagnosis Date  . Brittle bones    per pt, has soft bones in right foot/wears boot cast!  . CAD (coronary artery disease)   . Cataract    Bil/ surg scheduled for right eye 01/18/17/ left eye 02/08/17  . Charcot ankle, right 2019  . CHF (congestive heart failure) ([place]) 2015  . Diabetes mellitus   . Heart failure, diastolic ([place])   . Hyperlipidemia   . Hypertension   . Macular edema 2014  . OSA on CPAP   . Personal history of colonic polyps - adenomas 01/28/2014  . Renal  insufficiency    being examined for dialysis for home dialysis [facility]  . Shortness of breath dyspnea   . Syncope and collapse     Past Surgical History:  Procedure Laterality Date  . CARPAL TUNNEL RELEASE     left hand  . COLONOSCOPY    . INTRAVASCULAR PRESSURE WIRE/FFR STUDY N/A 12/18/2018   Procedure: INTRAVASCULAR PRESSURE WIRE/FFR STUDY;  Surgeon: Wellington Hampshire, MD;  Location: [facility];  Service: Cardiovascular;  Laterality: N/A;  . IR FLUORO GUIDE CV LINE RIGHT  12/16/2018  . IR US GUIDE VASC ACCESS RIGHT  12/16/2018  . KNEE ARTHROSCOPY Right 09/13/2016   [facility], Dr. Dorna Leitz  . PILONIDAL CYST EXCISION    . RIGHT/LEFT HEART CATH AND CORONARY ANGIOGRAPHY N/A 12/18/2018   Procedure: RIGHT/LEFT HEART CATH AND CORONARY ANGIOGRAPHY;  Surgeon: Wellington Hampshire, MD;  Location: [facility];  Service: Cardiovascular;  Laterality: N/A;    Family History  Problem Relation Age of Onset  . Diabetes Mother   . Kidney disease Mother   . Diabetes Father   . Coronary artery disease Father   . Diabetes Brother   . Diabetes Sister   . Prostate cancer Maternal Uncle   . Diabetes  Maternal Grandmother   . Cancer Paternal Grandmother        unknown  . Heart attack Paternal Grandfather   . Colon cancer Neg Hx     Social History   Socioeconomic History  . Marital status: Married    Spouse name: [name]  . Number of children: 5  . Years of education: 25  . Highest education level: 12th grade  Occupational History  . Occupation: Warehouse    Comment: retired  Scientific laboratory technician  . Financial resource strain: Not hard at all  . Food insecurity    Worry: Never true    Inability: Never true  . Transportation needs    Medical: No    Non-medical: No  Tobacco Use  . Smoking status: Former Smoker    Packs/day: 0.50    Years: 20.00    Pack years: 10.00    Types: Cigarettes    Quit date: 03/06/1977    Years since quitting: 42.5  . Smokeless tobacco: Never  Used  Substance and Sexual Activity  . Alcohol use: No  . Drug use: No  . Sexual activity: Not Currently  Lifestyle  . Physical activity    Days per week: 0 days    Minutes per session: 0 min  . Stress: Not at all  Relationships  . Social Herbalist on phone: Twice a week    Gets together: Twice a week    Attends religious service: More than 4 times per year    Active member of club or organization: No    Attends meetings of clubs or organizations: Never    Relationship status: Married  . Intimate partner violence    Fear of current or ex partner: No    Emotionally abused: No    Physically abused: No    Forced sexual activity: No  Other Topics Concern  . Not on file  Social History Narrative   Drinks a cup of coffee daily. Drinks a lot of water daily. Goes out with church members during the week    Outpatient Medications Prior to Visit  Medication Sig Dispense Refill  . allopurinol (ZYLOPRIM) 100 MG tablet TAKE 1 & 1/2 (ONE & ONE-HALF) TABLETS BY MOUTH TWICE DAILY 270 tablet 0  . AMBULATORY NON FORMULARY MEDICATION Rollator walker with seat. Dx: M25.561, M54.12, M10.00 1 each 0  . AMBULATORY NON FORMULARY MEDICATION Continuous positive airway pressure (CPAP) device: Auto titrate minimum 4 cm H20 to maximum of 20 cm H2O with pressure. Please provide all supplemental supplies as needed. Fax to: [phone] 1 Units 1  . amLODipine (NORVASC) 10 MG tablet Take 1 tablet (10 mg total) by mouth at bedtime. (Patient taking differently: Take 10 mg by mouth daily. Pt is taking this in the morning) 90 tablet 3  . B-D INS SYR ULTRAFINE 1CC/31G 31G X 5/16" 1 ML MISC     . Cholecalciferol (VITAMIN D3) 5000 units CAPS Take 5,000 Units by mouth daily.    . Coenzyme Q10 (CO Q 10 PO) Take 1 tablet by mouth daily.    Marland Kitchen gabapentin (NEURONTIN) 300 MG capsule Take 1 capsule (300 mg total) by mouth daily. 90 capsule 1  . hydrALAZINE (APRESOLINE) 100 MG tablet Take 1 tablet (100 mg total) by  mouth 3 (three) times daily. 270 tablet 3  . isosorbide dinitrate (ISORDIL) 20 MG tablet Take 1 tablet (20 mg total) by mouth 2 (two) times daily. 180 tablet 3  . nitroGLYCERIN (NITROSTAT) 0.4 MG SL tablet  Place 1 tablet (0.4 mg total) under the tongue every 5 (five) minutes as needed for chest pain. 25 tablet 0  . NOVOLIN N 100 UNIT/ML injection Inject 25 Units into the skin 2 (two) times daily before a meal.     . NOVOLIN R 100 UNIT/ML injection Inject 15-20 Units into the skin 3 (three) times daily with meals.   5  . ONE TOUCH ULTRA TEST test strip USE TO CHECK BLOOD SUGAR 4 TIMES A DAY DX E11.65 300 each 11  . rosuvastatin (CRESTOR) 40 MG tablet Take 1 tablet (40 mg total) by mouth daily. 90 tablet 3  . warfarin (COUMADIN) 5 MG tablet TAKE 1 TABLET BY MOUTH ONCE DAILY AT 6 PM. MONITOR INR. INR GOAL SHOULD BE BETWEEN 2-3. 90 tablet 0   No facility-administered medications prior to visit.     Allergies  Allergen Reactions  . Hydrocodone Nausea And Vomiting    Other reaction(s): GI Upset (intolerance) Projectile vomiting Projectile vomiting  . Oxycodone Nausea And Vomiting    Other reaction(s): GI Upset (intolerance), Vomiting (intolerance) Projectile vomiting Projectile vomiting  . Dacarbazine     Other reaction(s): Unknown  . Tape Dermatitis, Itching and Rash    Patch used at dialysis    ROS Review of Systems    Objective:    Physical Exam  Constitutional: He is oriented to person, place, and time. He appears well-developed and well-nourished.  HENT:  Head: Normocephalic and atraumatic.  Cardiovascular: Normal rate, regular rhythm and normal heart sounds.  Pulmonary/Chest: Effort normal and breath sounds normal.  Neurological: He is alert and oriented to person, place, and time.  Skin: Skin is warm and dry.  Psychiatric: He has a normal mood and affect. His behavior is normal.    BP 140/70   Pulse 80   Ht 5\' 7"  (1.702 m)   Wt 235 lb (106.6 kg)   SpO2 99%   BMI  36.81 kg/m  Wt Readings from Last 3 Encounters:  09/08/19 235 lb (106.6 kg)  08/13/19 223 lb 1.9 oz (101.2 kg)  07/08/19 230 lb (104.3 kg)     There are no preventive care reminders to display for this patient.  There are no preventive care reminders to display for this patient.  Lab Results  Component Value Date   TSH 2.391 02/02/2017   Lab Results  Component Value Date   WBC 9.4 12/19/2018   HGB 12.8 (L) 12/19/2018   HCT 39.5 12/19/2018   MCV 91.4 12/19/2018   PLT 227 12/19/2018   Lab Results  Component Value Date   NA 138 12/19/2018   K 4.0 12/19/2018   CO2 27 12/19/2018   GLUCOSE 129 (H) 12/19/2018   BUN 52 (H) 12/19/2018   CREATININE 2.34 (H) 12/19/2018   BILITOT 1.0 08/19/2019   ALKPHOS 98 11/27/2017   AST 19 08/19/2019   ALT 11 08/19/2019   PROT 6.6 08/19/2019   ALBUMIN 3.4 (L) 12/17/2018   CALCIUM 9.1 12/19/2018   ANIONGAP 13 12/19/2018   GFR 38.90 (L) 03/10/2011   Lab Results  Component Value Date   CHOL 103 08/19/2019   Lab Results  Component Value Date   HDL 37 (L) 08/19/2019   Lab Results  Component Value Date   LDLCALC 48 08/19/2019   Lab Results  Component Value Date   TRIG 97 08/19/2019   Lab Results  Component Value Date   CHOLHDL 2.8 08/19/2019   Lab Results  Component Value Date   HGBA1C 5.9 (  A) 09/08/2019      Assessment & Plan:   Problem List Items Addressed This Visit      Cardiovascular and Mediastinum   HYPERTENSION, BENIGN SYSTEMIC - Primary    *To send me the blood pressures over my chart so that we can make some adjustments if needed.  Clonidine was recently discontinued.        Endocrine   Diabetes mellitus with stage 4 chronic kidney disease ([place])    Repeat addition of dextrose infusions into the abdomen have caused a spike in his blood sugar levels.  We discussed may be increasing his insulin in in the mornings to 25 units since usually the infusions are in the middle the afternoon.  He may have to end up  adjusting his nighttime in as well once he actually starts doing the nighttime peritoneal dialysis.  It will likely start sometime this week.  The nurses supposed to come out for reassessment today.  That way he should not have to give [place] much regular insulin close to bedtime.  Certainly they can call and let me know what is going on with the blood sugars and we can make that small adjustments if needed.      Relevant Orders   POCT glycosylated hemoglobin (Hb A1C) (Completed)     Other   Severe obesity (BMI 35.0-39.9) with comorbidity ([place])    Discussed starting to do some chair exercises encouraged him to come to YouTube and look at different free videos to do chair exercises at home he needs to work on lower extremity strengthening as well as trying to lose some weight.       Other Visit Diagnoses    BMI 36.0-36.9,adult          No orders of the defined types were placed in this encounter.   Follow-up: Return in about 3 months (around 12/09/2019) for Diabetes follow-up.    [name], MD

## 2019-09-08 NOTE — Assessment & Plan Note (Signed)
Discussed starting to do some chair exercises encouraged him to come to YouTube and look at different free videos to do chair exercises at home he needs to work on lower extremity strengthening as well as trying to lose some weight.

## 2019-09-08 NOTE — Assessment & Plan Note (Signed)
Repeat addition of dextrose infusions into the abdomen have caused a spike in his blood sugar levels.  We discussed may be increasing his insulin in in the mornings to 25 units since usually the infusions are in the middle the afternoon.  He may have to end up adjusting his nighttime in as well once he actually starts doing the nighttime peritoneal dialysis.  It will likely start sometime this week.  The nurses supposed to come out for reassessment today.  That way he should not have to give [place] much regular insulin close to bedtime.  Certainly they can call and let me know what is going on with the blood sugars and we can make that small adjustments if needed.

## 2019-09-09 DIAGNOSIS — D631 Anemia in chronic kidney disease: Secondary | ICD-10-CM | POA: Diagnosis not present

## 2019-09-09 DIAGNOSIS — N186 End stage renal disease: Secondary | ICD-10-CM | POA: Diagnosis not present

## 2019-09-09 DIAGNOSIS — N2581 Secondary hyperparathyroidism of renal origin: Secondary | ICD-10-CM | POA: Diagnosis not present

## 2019-09-09 DIAGNOSIS — Z992 Dependence on renal dialysis: Secondary | ICD-10-CM | POA: Diagnosis not present

## 2019-09-09 DIAGNOSIS — D509 Iron deficiency anemia, unspecified: Secondary | ICD-10-CM | POA: Diagnosis not present

## 2019-09-10 DIAGNOSIS — D509 Iron deficiency anemia, unspecified: Secondary | ICD-10-CM | POA: Diagnosis not present

## 2019-09-10 DIAGNOSIS — N186 End stage renal disease: Secondary | ICD-10-CM | POA: Diagnosis not present

## 2019-09-10 DIAGNOSIS — E1122 Type 2 diabetes mellitus with diabetic chronic kidney disease: Secondary | ICD-10-CM | POA: Diagnosis not present

## 2019-09-10 DIAGNOSIS — Z992 Dependence on renal dialysis: Secondary | ICD-10-CM | POA: Diagnosis not present

## 2019-09-10 DIAGNOSIS — Z4932 Encounter for adequacy testing for peritoneal dialysis: Secondary | ICD-10-CM | POA: Diagnosis not present

## 2019-09-10 DIAGNOSIS — N2581 Secondary hyperparathyroidism of renal origin: Secondary | ICD-10-CM | POA: Diagnosis not present

## 2019-09-11 DIAGNOSIS — D509 Iron deficiency anemia, unspecified: Secondary | ICD-10-CM | POA: Diagnosis not present

## 2019-09-11 DIAGNOSIS — N2581 Secondary hyperparathyroidism of renal origin: Secondary | ICD-10-CM | POA: Diagnosis not present

## 2019-09-11 DIAGNOSIS — Z4932 Encounter for adequacy testing for peritoneal dialysis: Secondary | ICD-10-CM | POA: Diagnosis not present

## 2019-09-11 DIAGNOSIS — N186 End stage renal disease: Secondary | ICD-10-CM | POA: Diagnosis not present

## 2019-09-11 DIAGNOSIS — Z992 Dependence on renal dialysis: Secondary | ICD-10-CM | POA: Diagnosis not present

## 2019-09-12 DIAGNOSIS — Z4932 Encounter for adequacy testing for peritoneal dialysis: Secondary | ICD-10-CM | POA: Diagnosis not present

## 2019-09-12 DIAGNOSIS — N186 End stage renal disease: Secondary | ICD-10-CM | POA: Diagnosis not present

## 2019-09-12 DIAGNOSIS — D509 Iron deficiency anemia, unspecified: Secondary | ICD-10-CM | POA: Diagnosis not present

## 2019-09-12 DIAGNOSIS — N2581 Secondary hyperparathyroidism of renal origin: Secondary | ICD-10-CM | POA: Diagnosis not present

## 2019-09-12 DIAGNOSIS — Z992 Dependence on renal dialysis: Secondary | ICD-10-CM | POA: Diagnosis not present

## 2019-09-13 DIAGNOSIS — N2581 Secondary hyperparathyroidism of renal origin: Secondary | ICD-10-CM | POA: Diagnosis not present

## 2019-09-13 DIAGNOSIS — N186 End stage renal disease: Secondary | ICD-10-CM | POA: Diagnosis not present

## 2019-09-13 DIAGNOSIS — D509 Iron deficiency anemia, unspecified: Secondary | ICD-10-CM | POA: Diagnosis not present

## 2019-09-13 DIAGNOSIS — Z4932 Encounter for adequacy testing for peritoneal dialysis: Secondary | ICD-10-CM | POA: Diagnosis not present

## 2019-09-13 DIAGNOSIS — Z992 Dependence on renal dialysis: Secondary | ICD-10-CM | POA: Diagnosis not present

## 2019-09-14 DIAGNOSIS — Z4932 Encounter for adequacy testing for peritoneal dialysis: Secondary | ICD-10-CM | POA: Diagnosis not present

## 2019-09-14 DIAGNOSIS — Z992 Dependence on renal dialysis: Secondary | ICD-10-CM | POA: Diagnosis not present

## 2019-09-14 DIAGNOSIS — N2581 Secondary hyperparathyroidism of renal origin: Secondary | ICD-10-CM | POA: Diagnosis not present

## 2019-09-14 DIAGNOSIS — N186 End stage renal disease: Secondary | ICD-10-CM | POA: Diagnosis not present

## 2019-09-14 DIAGNOSIS — D509 Iron deficiency anemia, unspecified: Secondary | ICD-10-CM | POA: Diagnosis not present

## 2019-09-15 DIAGNOSIS — N186 End stage renal disease: Secondary | ICD-10-CM | POA: Diagnosis not present

## 2019-09-15 DIAGNOSIS — D509 Iron deficiency anemia, unspecified: Secondary | ICD-10-CM | POA: Diagnosis not present

## 2019-09-15 DIAGNOSIS — N2581 Secondary hyperparathyroidism of renal origin: Secondary | ICD-10-CM | POA: Diagnosis not present

## 2019-09-15 DIAGNOSIS — Z4932 Encounter for adequacy testing for peritoneal dialysis: Secondary | ICD-10-CM | POA: Diagnosis not present

## 2019-09-15 DIAGNOSIS — Z992 Dependence on renal dialysis: Secondary | ICD-10-CM | POA: Diagnosis not present

## 2019-09-16 DIAGNOSIS — N2581 Secondary hyperparathyroidism of renal origin: Secondary | ICD-10-CM | POA: Diagnosis not present

## 2019-09-16 DIAGNOSIS — D509 Iron deficiency anemia, unspecified: Secondary | ICD-10-CM | POA: Diagnosis not present

## 2019-09-16 DIAGNOSIS — N186 End stage renal disease: Secondary | ICD-10-CM | POA: Diagnosis not present

## 2019-09-16 DIAGNOSIS — Z992 Dependence on renal dialysis: Secondary | ICD-10-CM | POA: Diagnosis not present

## 2019-09-16 DIAGNOSIS — Z4932 Encounter for adequacy testing for peritoneal dialysis: Secondary | ICD-10-CM | POA: Diagnosis not present

## 2019-09-17 DIAGNOSIS — D509 Iron deficiency anemia, unspecified: Secondary | ICD-10-CM | POA: Diagnosis not present

## 2019-09-17 DIAGNOSIS — N186 End stage renal disease: Secondary | ICD-10-CM | POA: Diagnosis not present

## 2019-09-17 DIAGNOSIS — Z4932 Encounter for adequacy testing for peritoneal dialysis: Secondary | ICD-10-CM | POA: Diagnosis not present

## 2019-09-17 DIAGNOSIS — N2581 Secondary hyperparathyroidism of renal origin: Secondary | ICD-10-CM | POA: Diagnosis not present

## 2019-09-17 DIAGNOSIS — Z992 Dependence on renal dialysis: Secondary | ICD-10-CM | POA: Diagnosis not present

## 2019-09-18 DIAGNOSIS — N186 End stage renal disease: Secondary | ICD-10-CM | POA: Diagnosis not present

## 2019-09-18 DIAGNOSIS — Z4932 Encounter for adequacy testing for peritoneal dialysis: Secondary | ICD-10-CM | POA: Diagnosis not present

## 2019-09-18 DIAGNOSIS — D509 Iron deficiency anemia, unspecified: Secondary | ICD-10-CM | POA: Diagnosis not present

## 2019-09-18 DIAGNOSIS — N2581 Secondary hyperparathyroidism of renal origin: Secondary | ICD-10-CM | POA: Diagnosis not present

## 2019-09-18 DIAGNOSIS — Z992 Dependence on renal dialysis: Secondary | ICD-10-CM | POA: Diagnosis not present

## 2019-09-19 DIAGNOSIS — Z4932 Encounter for adequacy testing for peritoneal dialysis: Secondary | ICD-10-CM | POA: Diagnosis not present

## 2019-09-19 DIAGNOSIS — N2581 Secondary hyperparathyroidism of renal origin: Secondary | ICD-10-CM | POA: Diagnosis not present

## 2019-09-19 DIAGNOSIS — N186 End stage renal disease: Secondary | ICD-10-CM | POA: Diagnosis not present

## 2019-09-19 DIAGNOSIS — D509 Iron deficiency anemia, unspecified: Secondary | ICD-10-CM | POA: Diagnosis not present

## 2019-09-19 DIAGNOSIS — Z992 Dependence on renal dialysis: Secondary | ICD-10-CM | POA: Diagnosis not present

## 2019-09-20 DIAGNOSIS — D509 Iron deficiency anemia, unspecified: Secondary | ICD-10-CM | POA: Diagnosis not present

## 2019-09-20 DIAGNOSIS — N186 End stage renal disease: Secondary | ICD-10-CM | POA: Diagnosis not present

## 2019-09-20 DIAGNOSIS — Z4932 Encounter for adequacy testing for peritoneal dialysis: Secondary | ICD-10-CM | POA: Diagnosis not present

## 2019-09-20 DIAGNOSIS — Z992 Dependence on renal dialysis: Secondary | ICD-10-CM | POA: Diagnosis not present

## 2019-09-20 DIAGNOSIS — N2581 Secondary hyperparathyroidism of renal origin: Secondary | ICD-10-CM | POA: Diagnosis not present

## 2019-09-21 DIAGNOSIS — Z992 Dependence on renal dialysis: Secondary | ICD-10-CM | POA: Diagnosis not present

## 2019-09-21 DIAGNOSIS — N186 End stage renal disease: Secondary | ICD-10-CM | POA: Diagnosis not present

## 2019-09-21 DIAGNOSIS — R17 Unspecified jaundice: Secondary | ICD-10-CM | POA: Diagnosis not present

## 2019-09-21 DIAGNOSIS — Z4932 Encounter for adequacy testing for peritoneal dialysis: Secondary | ICD-10-CM | POA: Diagnosis not present

## 2019-09-21 DIAGNOSIS — D631 Anemia in chronic kidney disease: Secondary | ICD-10-CM | POA: Diagnosis not present

## 2019-09-21 DIAGNOSIS — E876 Hypokalemia: Secondary | ICD-10-CM | POA: Diagnosis not present

## 2019-09-21 DIAGNOSIS — E1122 Type 2 diabetes mellitus with diabetic chronic kidney disease: Secondary | ICD-10-CM | POA: Diagnosis not present

## 2019-09-21 DIAGNOSIS — N2581 Secondary hyperparathyroidism of renal origin: Secondary | ICD-10-CM | POA: Diagnosis not present

## 2019-09-21 DIAGNOSIS — D509 Iron deficiency anemia, unspecified: Secondary | ICD-10-CM | POA: Diagnosis not present

## 2019-09-21 DIAGNOSIS — Z79899 Other long term (current) drug therapy: Secondary | ICD-10-CM | POA: Diagnosis not present

## 2019-09-23 ENCOUNTER — Ambulatory Visit: Payer: Medicare Other

## 2019-09-23 DIAGNOSIS — E876 Hypokalemia: Secondary | ICD-10-CM | POA: Diagnosis not present

## 2019-09-23 DIAGNOSIS — Z79899 Other long term (current) drug therapy: Secondary | ICD-10-CM | POA: Diagnosis not present

## 2019-09-23 DIAGNOSIS — Z4932 Encounter for adequacy testing for peritoneal dialysis: Secondary | ICD-10-CM | POA: Diagnosis not present

## 2019-09-23 DIAGNOSIS — R82998 Other abnormal findings in urine: Secondary | ICD-10-CM | POA: Diagnosis not present

## 2019-09-23 DIAGNOSIS — Z992 Dependence on renal dialysis: Secondary | ICD-10-CM | POA: Diagnosis not present

## 2019-09-23 DIAGNOSIS — D509 Iron deficiency anemia, unspecified: Secondary | ICD-10-CM | POA: Diagnosis not present

## 2019-09-23 DIAGNOSIS — N186 End stage renal disease: Secondary | ICD-10-CM | POA: Diagnosis not present

## 2019-09-24 ENCOUNTER — Telehealth: Payer: Self-pay | Admitting: *Deleted

## 2019-09-24 ENCOUNTER — Ambulatory Visit (INDEPENDENT_AMBULATORY_CARE_PROVIDER_SITE_OTHER): Payer: Medicare Other | Admitting: Family Medicine

## 2019-09-24 ENCOUNTER — Other Ambulatory Visit: Payer: Self-pay

## 2019-09-24 DIAGNOSIS — D509 Iron deficiency anemia, unspecified: Secondary | ICD-10-CM | POA: Diagnosis not present

## 2019-09-24 DIAGNOSIS — Z79899 Other long term (current) drug therapy: Secondary | ICD-10-CM | POA: Diagnosis not present

## 2019-09-24 DIAGNOSIS — I4891 Unspecified atrial fibrillation: Secondary | ICD-10-CM

## 2019-09-24 DIAGNOSIS — Z992 Dependence on renal dialysis: Secondary | ICD-10-CM | POA: Diagnosis not present

## 2019-09-24 DIAGNOSIS — Z4932 Encounter for adequacy testing for peritoneal dialysis: Secondary | ICD-10-CM | POA: Diagnosis not present

## 2019-09-24 DIAGNOSIS — E876 Hypokalemia: Secondary | ICD-10-CM | POA: Diagnosis not present

## 2019-09-24 DIAGNOSIS — N186 End stage renal disease: Secondary | ICD-10-CM | POA: Diagnosis not present

## 2019-09-24 LAB — POCT INR
INR: 4 — AB (ref 2.0–3.0)
INR: 4 — AB (ref 2.0–3.0)

## 2019-09-24 NOTE — Telephone Encounter (Signed)
Form completed,faxed,confirmation received and scanned into patient's chart.Maryruth Eve, [name], CMA

## 2019-09-24 NOTE — Progress Notes (Signed)
Pt in today for INR check. Pt reports taking    2. 5 mg on Tuesday and Fridays and 5 mg all others. Pt reports no change in diet, no recent hospitalizations, no bleeding or bruising, or SOB.  Pt did have dialasys todays this AM. INR was 4.0 . Results discussed with provider.Pt is to take 2.5 mg on Wednesday, Friday and Monday.% mg all others. Pt to return in 10 days for next INR. Pt notified.

## 2019-09-24 NOTE — Progress Notes (Signed)
Agree with documentation as above.   Catherine Metheney, MD  

## 2019-09-25 ENCOUNTER — Telehealth: Payer: Self-pay | Admitting: Family Medicine

## 2019-09-25 DIAGNOSIS — Z79899 Other long term (current) drug therapy: Secondary | ICD-10-CM | POA: Diagnosis not present

## 2019-09-25 DIAGNOSIS — E876 Hypokalemia: Secondary | ICD-10-CM | POA: Diagnosis not present

## 2019-09-25 DIAGNOSIS — Z4932 Encounter for adequacy testing for peritoneal dialysis: Secondary | ICD-10-CM | POA: Diagnosis not present

## 2019-09-25 DIAGNOSIS — D509 Iron deficiency anemia, unspecified: Secondary | ICD-10-CM | POA: Diagnosis not present

## 2019-09-25 DIAGNOSIS — N186 End stage renal disease: Secondary | ICD-10-CM | POA: Diagnosis not present

## 2019-09-25 DIAGNOSIS — E113213 Type 2 diabetes mellitus with mild nonproliferative diabetic retinopathy with macular edema, bilateral: Secondary | ICD-10-CM | POA: Diagnosis not present

## 2019-09-25 DIAGNOSIS — Z992 Dependence on renal dialysis: Secondary | ICD-10-CM | POA: Diagnosis not present

## 2019-09-25 NOTE — Telephone Encounter (Signed)
Patients daughter was around father and step mother on Sunday. They both tested positive. Patient saw their daughter yesterday and wanted to let PCP know. Patient is high risk.

## 2019-09-25 NOTE — Telephone Encounter (Signed)
Patient did not have direct exposure. Patient's step-daughter was around her real father on Sunday and that is who was positive. Step father saw step daughter yesterday. That is when he was informed of the real father being positive.  Daughter had rapid test, which was negative.   Worried about possible exposure and being high risk. I advised Dom to have patient and wife schedule virtual visit to discuss.

## 2019-09-25 NOTE — Telephone Encounter (Signed)
Please call patient and clarify.  I am not sure if this means that they were around the patient on Sunday but they actually tested positive yesterday.Marland Kitchen

## 2019-09-26 ENCOUNTER — Encounter: Payer: Self-pay | Admitting: Family Medicine

## 2019-09-26 ENCOUNTER — Ambulatory Visit (INDEPENDENT_AMBULATORY_CARE_PROVIDER_SITE_OTHER): Payer: Medicare Other | Admitting: Family Medicine

## 2019-09-26 VITALS — BP 144/65 | HR 76 | Temp 94.6°F

## 2019-09-26 DIAGNOSIS — Z20822 Contact with and (suspected) exposure to covid-19: Secondary | ICD-10-CM

## 2019-09-26 DIAGNOSIS — Z992 Dependence on renal dialysis: Secondary | ICD-10-CM | POA: Diagnosis not present

## 2019-09-26 DIAGNOSIS — Z20828 Contact with and (suspected) exposure to other viral communicable diseases: Secondary | ICD-10-CM | POA: Diagnosis not present

## 2019-09-26 DIAGNOSIS — I2510 Atherosclerotic heart disease of native coronary artery without angina pectoris: Secondary | ICD-10-CM | POA: Diagnosis not present

## 2019-09-26 DIAGNOSIS — N186 End stage renal disease: Secondary | ICD-10-CM | POA: Diagnosis not present

## 2019-09-26 DIAGNOSIS — D509 Iron deficiency anemia, unspecified: Secondary | ICD-10-CM | POA: Diagnosis not present

## 2019-09-26 DIAGNOSIS — E876 Hypokalemia: Secondary | ICD-10-CM | POA: Diagnosis not present

## 2019-09-26 DIAGNOSIS — Z79899 Other long term (current) drug therapy: Secondary | ICD-10-CM | POA: Diagnosis not present

## 2019-09-26 DIAGNOSIS — Z4932 Encounter for adequacy testing for peritoneal dialysis: Secondary | ICD-10-CM | POA: Diagnosis not present

## 2019-09-26 NOTE — Progress Notes (Signed)
Virtual Visit via Telephone Note  I connected with Louis [name] on 09/26/19 at  1:40 PM EST by telephone and verified that I am speaking with the correct person using two identifiers.   I discussed the limitations, risks, security and privacy concerns of performing an evaluation and management service by telephone and the availability of in person appointments. I also discussed with the patient that there may be a patient responsible charge related to this service. The patient expressed understanding and agreed to proceed.   Subjective:    CC: Possible COVID exposure  HPI: Pt reports that his step -daughter was at a "home church" service on Sunday with her dad and his wife.  She stated that his step- daughter called her and informed her that her father tested positive on Wednesday and his wife tested positive on yesterday.  They have been symptomatic.  He informed me that he was around her daughter (testing pending),grand daughter ( positive COVID test)  and grandson ( test pending).  He is on peritoneal dialysis and is due to go back into the dialysis center next week for an injection to boost his iron/hgb. He says he has to test negative before he can go for an appt.    He reports he is asymptomatic.  No cough, SOB, fever, HA or ST  Past medical history, Surgical history, Family history not pertinant except as noted below, Social history, Allergies, and medications have been entered into the medical record, reviewed, and corrections made.   Review of Systems: No fevers, chills, night sweats, weight loss, chest pain, or shortness of breath.   Objective:    General: Speaking clearly in complete sentences without any shortness of breath.  Alert and oriented x3.  Normal judgment. No apparent acute distress.    Impression and Recommendations:    Possible COVID exposure - Right now too early to get tested. Exposure was less than 48 hours ago. Encouraged to wait until Monday and  push out injection at dialysis until has results back.  Encouraged them to quarantine until can get tested. He is currently asymptomatic. Call back if develops any sxs.  Given infor for where to get tested on Monday.   He is very high risk for COVID complications.      I discussed the assessment and treatment plan with the patient. The patient was provided an opportunity to ask questions and all were answered. The patient agreed with the plan and demonstrated an understanding of the instructions.   The patient was advised to call back or seek an in-person evaluation if the symptoms worsen or if the condition fails to improve as anticipated.  I provided 15 minutes of non-face-to-face time during this encounter.   [name], MD

## 2019-09-26 NOTE — Progress Notes (Signed)
[name] reports that her daughter was at a "home church" service on Sunday with her dad and his wife.  She informed me that her daughter called her and informed her that her father tested positive on Wednesday and his wife tested positive on yesterday.  She also told me that she was concerned because she was around her daughter,granddaughter and grandson before she knew about the positive tests and that she had hugged both her daughter and granddaughter and that Latonya was outside sitting in the car and had only hugged the grandson.   She found out that her granddaughter had been tested and her results came back yesterday and she is positive. She hasn't heard whether or not her daughter has gotten tested or her status. And it is unknown if the grandson has been tested.   She states that he doesn't have any sxs however he is scheduled to have an iron infusion done on 11/11 and that when she informed the nurse of this she was advised that they get tested because he cannot come into the facility due to his possible exposure and he will need to be tested also.  Maryruth Eve, [name], CMA

## 2019-09-27 DIAGNOSIS — E876 Hypokalemia: Secondary | ICD-10-CM | POA: Diagnosis not present

## 2019-09-27 DIAGNOSIS — Z992 Dependence on renal dialysis: Secondary | ICD-10-CM | POA: Diagnosis not present

## 2019-09-27 DIAGNOSIS — N186 End stage renal disease: Secondary | ICD-10-CM | POA: Diagnosis not present

## 2019-09-27 DIAGNOSIS — D509 Iron deficiency anemia, unspecified: Secondary | ICD-10-CM | POA: Diagnosis not present

## 2019-09-27 DIAGNOSIS — Z4932 Encounter for adequacy testing for peritoneal dialysis: Secondary | ICD-10-CM | POA: Diagnosis not present

## 2019-09-27 DIAGNOSIS — Z79899 Other long term (current) drug therapy: Secondary | ICD-10-CM | POA: Diagnosis not present

## 2019-09-28 DIAGNOSIS — D509 Iron deficiency anemia, unspecified: Secondary | ICD-10-CM | POA: Diagnosis not present

## 2019-09-28 DIAGNOSIS — Z79899 Other long term (current) drug therapy: Secondary | ICD-10-CM | POA: Diagnosis not present

## 2019-09-28 DIAGNOSIS — N186 End stage renal disease: Secondary | ICD-10-CM | POA: Diagnosis not present

## 2019-09-28 DIAGNOSIS — Z992 Dependence on renal dialysis: Secondary | ICD-10-CM | POA: Diagnosis not present

## 2019-09-28 DIAGNOSIS — Z4932 Encounter for adequacy testing for peritoneal dialysis: Secondary | ICD-10-CM | POA: Diagnosis not present

## 2019-09-28 DIAGNOSIS — E876 Hypokalemia: Secondary | ICD-10-CM | POA: Diagnosis not present

## 2019-09-29 ENCOUNTER — Other Ambulatory Visit: Payer: Self-pay

## 2019-09-29 DIAGNOSIS — Z992 Dependence on renal dialysis: Secondary | ICD-10-CM | POA: Diagnosis not present

## 2019-09-29 DIAGNOSIS — E876 Hypokalemia: Secondary | ICD-10-CM | POA: Diagnosis not present

## 2019-09-29 DIAGNOSIS — N186 End stage renal disease: Secondary | ICD-10-CM | POA: Diagnosis not present

## 2019-09-29 DIAGNOSIS — D509 Iron deficiency anemia, unspecified: Secondary | ICD-10-CM | POA: Diagnosis not present

## 2019-09-29 DIAGNOSIS — Z20828 Contact with and (suspected) exposure to other viral communicable diseases: Secondary | ICD-10-CM | POA: Diagnosis not present

## 2019-09-29 DIAGNOSIS — Z4932 Encounter for adequacy testing for peritoneal dialysis: Secondary | ICD-10-CM | POA: Diagnosis not present

## 2019-09-29 DIAGNOSIS — Z79899 Other long term (current) drug therapy: Secondary | ICD-10-CM | POA: Diagnosis not present

## 2019-09-29 DIAGNOSIS — Z20822 Contact with and (suspected) exposure to covid-19: Secondary | ICD-10-CM

## 2019-09-30 DIAGNOSIS — N186 End stage renal disease: Secondary | ICD-10-CM | POA: Diagnosis not present

## 2019-09-30 DIAGNOSIS — Z79899 Other long term (current) drug therapy: Secondary | ICD-10-CM | POA: Diagnosis not present

## 2019-09-30 DIAGNOSIS — E876 Hypokalemia: Secondary | ICD-10-CM | POA: Diagnosis not present

## 2019-09-30 DIAGNOSIS — Z4932 Encounter for adequacy testing for peritoneal dialysis: Secondary | ICD-10-CM | POA: Diagnosis not present

## 2019-09-30 DIAGNOSIS — D509 Iron deficiency anemia, unspecified: Secondary | ICD-10-CM | POA: Diagnosis not present

## 2019-09-30 DIAGNOSIS — Z992 Dependence on renal dialysis: Secondary | ICD-10-CM | POA: Diagnosis not present

## 2019-09-30 LAB — NOVEL CORONAVIRUS, NAA: SARS-CoV-2, NAA: NOT DETECTED

## 2019-10-01 DIAGNOSIS — D509 Iron deficiency anemia, unspecified: Secondary | ICD-10-CM | POA: Diagnosis not present

## 2019-10-01 DIAGNOSIS — E876 Hypokalemia: Secondary | ICD-10-CM | POA: Diagnosis not present

## 2019-10-01 DIAGNOSIS — Z4932 Encounter for adequacy testing for peritoneal dialysis: Secondary | ICD-10-CM | POA: Diagnosis not present

## 2019-10-01 DIAGNOSIS — Z79899 Other long term (current) drug therapy: Secondary | ICD-10-CM | POA: Diagnosis not present

## 2019-10-01 DIAGNOSIS — Z992 Dependence on renal dialysis: Secondary | ICD-10-CM | POA: Diagnosis not present

## 2019-10-01 DIAGNOSIS — N186 End stage renal disease: Secondary | ICD-10-CM | POA: Diagnosis not present

## 2019-10-02 DIAGNOSIS — Z4932 Encounter for adequacy testing for peritoneal dialysis: Secondary | ICD-10-CM | POA: Diagnosis not present

## 2019-10-02 DIAGNOSIS — Z992 Dependence on renal dialysis: Secondary | ICD-10-CM | POA: Diagnosis not present

## 2019-10-02 DIAGNOSIS — D509 Iron deficiency anemia, unspecified: Secondary | ICD-10-CM | POA: Diagnosis not present

## 2019-10-02 DIAGNOSIS — Z79899 Other long term (current) drug therapy: Secondary | ICD-10-CM | POA: Diagnosis not present

## 2019-10-02 DIAGNOSIS — E876 Hypokalemia: Secondary | ICD-10-CM | POA: Diagnosis not present

## 2019-10-02 DIAGNOSIS — N186 End stage renal disease: Secondary | ICD-10-CM | POA: Diagnosis not present

## 2019-10-03 DIAGNOSIS — N186 End stage renal disease: Secondary | ICD-10-CM | POA: Diagnosis not present

## 2019-10-03 DIAGNOSIS — Z992 Dependence on renal dialysis: Secondary | ICD-10-CM | POA: Diagnosis not present

## 2019-10-03 DIAGNOSIS — Z79899 Other long term (current) drug therapy: Secondary | ICD-10-CM | POA: Diagnosis not present

## 2019-10-03 DIAGNOSIS — D509 Iron deficiency anemia, unspecified: Secondary | ICD-10-CM | POA: Diagnosis not present

## 2019-10-03 DIAGNOSIS — Z4932 Encounter for adequacy testing for peritoneal dialysis: Secondary | ICD-10-CM | POA: Diagnosis not present

## 2019-10-03 DIAGNOSIS — E876 Hypokalemia: Secondary | ICD-10-CM | POA: Diagnosis not present

## 2019-10-04 DIAGNOSIS — Z992 Dependence on renal dialysis: Secondary | ICD-10-CM | POA: Diagnosis not present

## 2019-10-04 DIAGNOSIS — N186 End stage renal disease: Secondary | ICD-10-CM | POA: Diagnosis not present

## 2019-10-04 DIAGNOSIS — Z4932 Encounter for adequacy testing for peritoneal dialysis: Secondary | ICD-10-CM | POA: Diagnosis not present

## 2019-10-04 DIAGNOSIS — D509 Iron deficiency anemia, unspecified: Secondary | ICD-10-CM | POA: Diagnosis not present

## 2019-10-04 DIAGNOSIS — E876 Hypokalemia: Secondary | ICD-10-CM | POA: Diagnosis not present

## 2019-10-04 DIAGNOSIS — Z79899 Other long term (current) drug therapy: Secondary | ICD-10-CM | POA: Diagnosis not present

## 2019-10-05 DIAGNOSIS — E876 Hypokalemia: Secondary | ICD-10-CM | POA: Diagnosis not present

## 2019-10-05 DIAGNOSIS — Z79899 Other long term (current) drug therapy: Secondary | ICD-10-CM | POA: Diagnosis not present

## 2019-10-05 DIAGNOSIS — Z992 Dependence on renal dialysis: Secondary | ICD-10-CM | POA: Diagnosis not present

## 2019-10-05 DIAGNOSIS — Z4932 Encounter for adequacy testing for peritoneal dialysis: Secondary | ICD-10-CM | POA: Diagnosis not present

## 2019-10-05 DIAGNOSIS — N186 End stage renal disease: Secondary | ICD-10-CM | POA: Diagnosis not present

## 2019-10-05 DIAGNOSIS — D509 Iron deficiency anemia, unspecified: Secondary | ICD-10-CM | POA: Diagnosis not present

## 2019-10-06 ENCOUNTER — Other Ambulatory Visit: Payer: Self-pay

## 2019-10-06 ENCOUNTER — Ambulatory Visit (INDEPENDENT_AMBULATORY_CARE_PROVIDER_SITE_OTHER): Payer: Medicare Other | Admitting: Family Medicine

## 2019-10-06 DIAGNOSIS — N186 End stage renal disease: Secondary | ICD-10-CM | POA: Diagnosis not present

## 2019-10-06 DIAGNOSIS — I4891 Unspecified atrial fibrillation: Secondary | ICD-10-CM | POA: Diagnosis not present

## 2019-10-06 DIAGNOSIS — Z4932 Encounter for adequacy testing for peritoneal dialysis: Secondary | ICD-10-CM | POA: Diagnosis not present

## 2019-10-06 DIAGNOSIS — Z79899 Other long term (current) drug therapy: Secondary | ICD-10-CM | POA: Diagnosis not present

## 2019-10-06 DIAGNOSIS — Z992 Dependence on renal dialysis: Secondary | ICD-10-CM | POA: Diagnosis not present

## 2019-10-06 DIAGNOSIS — D509 Iron deficiency anemia, unspecified: Secondary | ICD-10-CM | POA: Diagnosis not present

## 2019-10-06 DIAGNOSIS — E876 Hypokalemia: Secondary | ICD-10-CM | POA: Diagnosis not present

## 2019-10-06 LAB — POCT INR: INR: 4.2 — AB (ref 2.0–3.0)

## 2019-10-06 NOTE — Progress Notes (Signed)
Agree with documentation as above.   Glendi Mohiuddin, MD  

## 2019-10-06 NOTE — Progress Notes (Signed)
Pt in office today for PT/INR. Pt states he takes a whole tab daily except 2.5 on Wednesday, Friday and Monday. His last INR was 4.0 and was told to recheck in 10 days. Verify correct dosage and PT/INR was obtained, todays reading was 4.2. Spoke with provider and patient is to take 2.5 mg daily except 5 mg on Wednesday and Sunday. Pt is to recheck in 10 days.

## 2019-10-07 DIAGNOSIS — N186 End stage renal disease: Secondary | ICD-10-CM | POA: Diagnosis not present

## 2019-10-07 DIAGNOSIS — Z4932 Encounter for adequacy testing for peritoneal dialysis: Secondary | ICD-10-CM | POA: Diagnosis not present

## 2019-10-07 DIAGNOSIS — D509 Iron deficiency anemia, unspecified: Secondary | ICD-10-CM | POA: Diagnosis not present

## 2019-10-07 DIAGNOSIS — Z992 Dependence on renal dialysis: Secondary | ICD-10-CM | POA: Diagnosis not present

## 2019-10-07 DIAGNOSIS — E876 Hypokalemia: Secondary | ICD-10-CM | POA: Diagnosis not present

## 2019-10-07 DIAGNOSIS — Z79899 Other long term (current) drug therapy: Secondary | ICD-10-CM | POA: Diagnosis not present

## 2019-10-08 DIAGNOSIS — Z992 Dependence on renal dialysis: Secondary | ICD-10-CM | POA: Diagnosis not present

## 2019-10-08 DIAGNOSIS — N186 End stage renal disease: Secondary | ICD-10-CM | POA: Diagnosis not present

## 2019-10-08 DIAGNOSIS — D509 Iron deficiency anemia, unspecified: Secondary | ICD-10-CM | POA: Diagnosis not present

## 2019-10-08 DIAGNOSIS — Z4932 Encounter for adequacy testing for peritoneal dialysis: Secondary | ICD-10-CM | POA: Diagnosis not present

## 2019-10-08 DIAGNOSIS — E876 Hypokalemia: Secondary | ICD-10-CM | POA: Diagnosis not present

## 2019-10-08 DIAGNOSIS — Z79899 Other long term (current) drug therapy: Secondary | ICD-10-CM | POA: Diagnosis not present

## 2019-10-09 DIAGNOSIS — Z4932 Encounter for adequacy testing for peritoneal dialysis: Secondary | ICD-10-CM | POA: Diagnosis not present

## 2019-10-09 DIAGNOSIS — Z452 Encounter for adjustment and management of vascular access device: Secondary | ICD-10-CM | POA: Diagnosis not present

## 2019-10-09 DIAGNOSIS — Z79899 Other long term (current) drug therapy: Secondary | ICD-10-CM | POA: Diagnosis not present

## 2019-10-09 DIAGNOSIS — N186 End stage renal disease: Secondary | ICD-10-CM | POA: Diagnosis not present

## 2019-10-09 DIAGNOSIS — E113213 Type 2 diabetes mellitus with mild nonproliferative diabetic retinopathy with macular edema, bilateral: Secondary | ICD-10-CM | POA: Diagnosis not present

## 2019-10-09 DIAGNOSIS — D509 Iron deficiency anemia, unspecified: Secondary | ICD-10-CM | POA: Diagnosis not present

## 2019-10-09 DIAGNOSIS — Z992 Dependence on renal dialysis: Secondary | ICD-10-CM | POA: Diagnosis not present

## 2019-10-09 DIAGNOSIS — E876 Hypokalemia: Secondary | ICD-10-CM | POA: Diagnosis not present

## 2019-10-10 DIAGNOSIS — Z4932 Encounter for adequacy testing for peritoneal dialysis: Secondary | ICD-10-CM | POA: Diagnosis not present

## 2019-10-10 DIAGNOSIS — E876 Hypokalemia: Secondary | ICD-10-CM | POA: Diagnosis not present

## 2019-10-10 DIAGNOSIS — D509 Iron deficiency anemia, unspecified: Secondary | ICD-10-CM | POA: Diagnosis not present

## 2019-10-10 DIAGNOSIS — Z79899 Other long term (current) drug therapy: Secondary | ICD-10-CM | POA: Diagnosis not present

## 2019-10-10 DIAGNOSIS — N186 End stage renal disease: Secondary | ICD-10-CM | POA: Diagnosis not present

## 2019-10-10 DIAGNOSIS — Z992 Dependence on renal dialysis: Secondary | ICD-10-CM | POA: Diagnosis not present

## 2019-10-11 DIAGNOSIS — Z79899 Other long term (current) drug therapy: Secondary | ICD-10-CM | POA: Diagnosis not present

## 2019-10-11 DIAGNOSIS — Z992 Dependence on renal dialysis: Secondary | ICD-10-CM | POA: Diagnosis not present

## 2019-10-11 DIAGNOSIS — Z4932 Encounter for adequacy testing for peritoneal dialysis: Secondary | ICD-10-CM | POA: Diagnosis not present

## 2019-10-11 DIAGNOSIS — D509 Iron deficiency anemia, unspecified: Secondary | ICD-10-CM | POA: Diagnosis not present

## 2019-10-11 DIAGNOSIS — N186 End stage renal disease: Secondary | ICD-10-CM | POA: Diagnosis not present

## 2019-10-11 DIAGNOSIS — E876 Hypokalemia: Secondary | ICD-10-CM | POA: Diagnosis not present

## 2019-10-12 DIAGNOSIS — Z79899 Other long term (current) drug therapy: Secondary | ICD-10-CM | POA: Diagnosis not present

## 2019-10-12 DIAGNOSIS — E876 Hypokalemia: Secondary | ICD-10-CM | POA: Diagnosis not present

## 2019-10-12 DIAGNOSIS — D509 Iron deficiency anemia, unspecified: Secondary | ICD-10-CM | POA: Diagnosis not present

## 2019-10-12 DIAGNOSIS — Z4932 Encounter for adequacy testing for peritoneal dialysis: Secondary | ICD-10-CM | POA: Diagnosis not present

## 2019-10-12 DIAGNOSIS — N186 End stage renal disease: Secondary | ICD-10-CM | POA: Diagnosis not present

## 2019-10-12 DIAGNOSIS — Z992 Dependence on renal dialysis: Secondary | ICD-10-CM | POA: Diagnosis not present

## 2019-10-13 ENCOUNTER — Telehealth: Payer: Self-pay

## 2019-10-13 ENCOUNTER — Telehealth: Payer: Self-pay | Admitting: Orthopedic Surgery

## 2019-10-13 DIAGNOSIS — E876 Hypokalemia: Secondary | ICD-10-CM | POA: Diagnosis not present

## 2019-10-13 DIAGNOSIS — Z992 Dependence on renal dialysis: Secondary | ICD-10-CM | POA: Diagnosis not present

## 2019-10-13 DIAGNOSIS — Z4932 Encounter for adequacy testing for peritoneal dialysis: Secondary | ICD-10-CM | POA: Diagnosis not present

## 2019-10-13 DIAGNOSIS — N186 End stage renal disease: Secondary | ICD-10-CM | POA: Diagnosis not present

## 2019-10-13 DIAGNOSIS — Z79899 Other long term (current) drug therapy: Secondary | ICD-10-CM | POA: Diagnosis not present

## 2019-10-13 DIAGNOSIS — D509 Iron deficiency anemia, unspecified: Secondary | ICD-10-CM | POA: Diagnosis not present

## 2019-10-13 NOTE — Telephone Encounter (Signed)
Please refer to notes pertaining to patient's appt in our office for follow-up.

## 2019-10-13 NOTE — Telephone Encounter (Signed)
Patient was called and lvm stating that patient needs to make an appt to be seen in our office for his Rx with [facility].

## 2019-10-13 NOTE — Telephone Encounter (Signed)
Patient's wife called and left a message on the voicemail. She would like for someone to call her. [facility] needs a new RX for his shoe and she would like to know if they should make an appointment with [facility] or if they can just have the RX faxed. Her call back number is [phone]

## 2019-10-14 DIAGNOSIS — E876 Hypokalemia: Secondary | ICD-10-CM | POA: Diagnosis not present

## 2019-10-14 DIAGNOSIS — D509 Iron deficiency anemia, unspecified: Secondary | ICD-10-CM | POA: Diagnosis not present

## 2019-10-14 DIAGNOSIS — N186 End stage renal disease: Secondary | ICD-10-CM | POA: Diagnosis not present

## 2019-10-14 DIAGNOSIS — Z79899 Other long term (current) drug therapy: Secondary | ICD-10-CM | POA: Diagnosis not present

## 2019-10-14 DIAGNOSIS — Z4932 Encounter for adequacy testing for peritoneal dialysis: Secondary | ICD-10-CM | POA: Diagnosis not present

## 2019-10-14 DIAGNOSIS — Z992 Dependence on renal dialysis: Secondary | ICD-10-CM | POA: Diagnosis not present

## 2019-10-15 ENCOUNTER — Other Ambulatory Visit: Payer: Self-pay

## 2019-10-15 ENCOUNTER — Ambulatory Visit (INDEPENDENT_AMBULATORY_CARE_PROVIDER_SITE_OTHER): Payer: Medicare Other | Admitting: Family Medicine

## 2019-10-15 DIAGNOSIS — D509 Iron deficiency anemia, unspecified: Secondary | ICD-10-CM | POA: Diagnosis not present

## 2019-10-15 DIAGNOSIS — I4891 Unspecified atrial fibrillation: Secondary | ICD-10-CM | POA: Diagnosis not present

## 2019-10-15 DIAGNOSIS — Z79899 Other long term (current) drug therapy: Secondary | ICD-10-CM | POA: Diagnosis not present

## 2019-10-15 DIAGNOSIS — E876 Hypokalemia: Secondary | ICD-10-CM | POA: Diagnosis not present

## 2019-10-15 DIAGNOSIS — Z4932 Encounter for adequacy testing for peritoneal dialysis: Secondary | ICD-10-CM | POA: Diagnosis not present

## 2019-10-15 DIAGNOSIS — N186 End stage renal disease: Secondary | ICD-10-CM | POA: Diagnosis not present

## 2019-10-15 DIAGNOSIS — Z992 Dependence on renal dialysis: Secondary | ICD-10-CM | POA: Diagnosis not present

## 2019-10-15 LAB — POCT INR: INR: 3 (ref 2.0–3.0)

## 2019-10-15 NOTE — Progress Notes (Signed)
Take a whole tab Wednesdays and Sundays and a half a tab the other 5 days a week. PT/INR today was 3.0 .Pt is to continue with current regimen and recheck in 2 weeks.Pt also brought his glucose readings for provider to review, copy was made and given to provider.

## 2019-10-15 NOTE — Progress Notes (Signed)
See flowsheet.  No changes.  Repeat 2 weeks.

## 2019-10-16 DIAGNOSIS — Z4932 Encounter for adequacy testing for peritoneal dialysis: Secondary | ICD-10-CM | POA: Diagnosis not present

## 2019-10-16 DIAGNOSIS — E876 Hypokalemia: Secondary | ICD-10-CM | POA: Diagnosis not present

## 2019-10-16 DIAGNOSIS — D509 Iron deficiency anemia, unspecified: Secondary | ICD-10-CM | POA: Diagnosis not present

## 2019-10-16 DIAGNOSIS — Z992 Dependence on renal dialysis: Secondary | ICD-10-CM | POA: Diagnosis not present

## 2019-10-16 DIAGNOSIS — Z79899 Other long term (current) drug therapy: Secondary | ICD-10-CM | POA: Diagnosis not present

## 2019-10-16 DIAGNOSIS — N186 End stage renal disease: Secondary | ICD-10-CM | POA: Diagnosis not present

## 2019-10-17 DIAGNOSIS — Z79899 Other long term (current) drug therapy: Secondary | ICD-10-CM | POA: Diagnosis not present

## 2019-10-17 DIAGNOSIS — Z4932 Encounter for adequacy testing for peritoneal dialysis: Secondary | ICD-10-CM | POA: Diagnosis not present

## 2019-10-17 DIAGNOSIS — D509 Iron deficiency anemia, unspecified: Secondary | ICD-10-CM | POA: Diagnosis not present

## 2019-10-17 DIAGNOSIS — N186 End stage renal disease: Secondary | ICD-10-CM | POA: Diagnosis not present

## 2019-10-17 DIAGNOSIS — E876 Hypokalemia: Secondary | ICD-10-CM | POA: Diagnosis not present

## 2019-10-17 DIAGNOSIS — Z992 Dependence on renal dialysis: Secondary | ICD-10-CM | POA: Diagnosis not present

## 2019-10-18 DIAGNOSIS — Z4932 Encounter for adequacy testing for peritoneal dialysis: Secondary | ICD-10-CM | POA: Diagnosis not present

## 2019-10-18 DIAGNOSIS — E876 Hypokalemia: Secondary | ICD-10-CM | POA: Diagnosis not present

## 2019-10-18 DIAGNOSIS — N186 End stage renal disease: Secondary | ICD-10-CM | POA: Diagnosis not present

## 2019-10-18 DIAGNOSIS — Z992 Dependence on renal dialysis: Secondary | ICD-10-CM | POA: Diagnosis not present

## 2019-10-18 DIAGNOSIS — Z79899 Other long term (current) drug therapy: Secondary | ICD-10-CM | POA: Diagnosis not present

## 2019-10-18 DIAGNOSIS — D509 Iron deficiency anemia, unspecified: Secondary | ICD-10-CM | POA: Diagnosis not present

## 2019-10-19 DIAGNOSIS — Z79899 Other long term (current) drug therapy: Secondary | ICD-10-CM | POA: Diagnosis not present

## 2019-10-19 DIAGNOSIS — N186 End stage renal disease: Secondary | ICD-10-CM | POA: Diagnosis not present

## 2019-10-19 DIAGNOSIS — Z992 Dependence on renal dialysis: Secondary | ICD-10-CM | POA: Diagnosis not present

## 2019-10-19 DIAGNOSIS — Z4932 Encounter for adequacy testing for peritoneal dialysis: Secondary | ICD-10-CM | POA: Diagnosis not present

## 2019-10-19 DIAGNOSIS — E876 Hypokalemia: Secondary | ICD-10-CM | POA: Diagnosis not present

## 2019-10-19 DIAGNOSIS — D509 Iron deficiency anemia, unspecified: Secondary | ICD-10-CM | POA: Diagnosis not present

## 2019-10-20 DIAGNOSIS — E876 Hypokalemia: Secondary | ICD-10-CM | POA: Diagnosis not present

## 2019-10-20 DIAGNOSIS — D509 Iron deficiency anemia, unspecified: Secondary | ICD-10-CM | POA: Diagnosis not present

## 2019-10-20 DIAGNOSIS — Z992 Dependence on renal dialysis: Secondary | ICD-10-CM | POA: Diagnosis not present

## 2019-10-20 DIAGNOSIS — Z4932 Encounter for adequacy testing for peritoneal dialysis: Secondary | ICD-10-CM | POA: Diagnosis not present

## 2019-10-20 DIAGNOSIS — Z79899 Other long term (current) drug therapy: Secondary | ICD-10-CM | POA: Diagnosis not present

## 2019-10-20 DIAGNOSIS — N186 End stage renal disease: Secondary | ICD-10-CM | POA: Diagnosis not present

## 2019-10-21 DIAGNOSIS — R82998 Other abnormal findings in urine: Secondary | ICD-10-CM | POA: Diagnosis not present

## 2019-10-27 ENCOUNTER — Emergency Department (HOSPITAL_COMMUNITY): Payer: Medicare Other

## 2019-10-27 ENCOUNTER — Encounter (HOSPITAL_COMMUNITY): Payer: Self-pay | Admitting: Emergency Medicine

## 2019-10-27 ENCOUNTER — Encounter: Payer: Self-pay | Admitting: Physician Assistant

## 2019-10-27 ENCOUNTER — Ambulatory Visit (INDEPENDENT_AMBULATORY_CARE_PROVIDER_SITE_OTHER): Payer: Medicare Other | Admitting: Physician Assistant

## 2019-10-27 ENCOUNTER — Other Ambulatory Visit: Payer: Self-pay

## 2019-10-27 ENCOUNTER — Emergency Department (HOSPITAL_COMMUNITY)
Admission: EM | Admit: 2019-10-27 | Discharge: 2019-10-27 | Disposition: A | Discharge status: Home / Self Care | Payer: Medicare Other | Attending: Emergency Medicine | Admitting: Emergency Medicine

## 2019-10-27 VITALS — BP 143/71 | HR 81 | Temp 101.8°F | Wt 220.2 lb

## 2019-10-27 DIAGNOSIS — E1122 Type 2 diabetes mellitus with diabetic chronic kidney disease: Secondary | ICD-10-CM | POA: Diagnosis not present

## 2019-10-27 DIAGNOSIS — E785 Hyperlipidemia, unspecified: Secondary | ICD-10-CM | POA: Diagnosis not present

## 2019-10-27 DIAGNOSIS — Z8249 Family history of ischemic heart disease and other diseases of the circulatory system: Secondary | ICD-10-CM | POA: Diagnosis not present

## 2019-10-27 DIAGNOSIS — Z20828 Contact with and (suspected) exposure to other viral communicable diseases: Secondary | ICD-10-CM | POA: Diagnosis not present

## 2019-10-27 DIAGNOSIS — U071 COVID-19: Secondary | ICD-10-CM | POA: Diagnosis not present

## 2019-10-27 DIAGNOSIS — I4891 Unspecified atrial fibrillation: Secondary | ICD-10-CM | POA: Insufficient documentation

## 2019-10-27 DIAGNOSIS — I2510 Atherosclerotic heart disease of native coronary artery without angina pectoris: Secondary | ICD-10-CM | POA: Diagnosis not present

## 2019-10-27 DIAGNOSIS — I509 Heart failure, unspecified: Secondary | ICD-10-CM | POA: Insufficient documentation

## 2019-10-27 DIAGNOSIS — R509 Fever, unspecified: Secondary | ICD-10-CM

## 2019-10-27 DIAGNOSIS — R188 Other ascites: Secondary | ICD-10-CM | POA: Diagnosis not present

## 2019-10-27 DIAGNOSIS — G4733 Obstructive sleep apnea (adult) (pediatric): Secondary | ICD-10-CM | POA: Diagnosis not present

## 2019-10-27 DIAGNOSIS — Z79899 Other long term (current) drug therapy: Secondary | ICD-10-CM | POA: Insufficient documentation

## 2019-10-27 DIAGNOSIS — I132 Hypertensive heart and chronic kidney disease with heart failure and with stage 5 chronic kidney disease, or end stage renal disease: Secondary | ICD-10-CM | POA: Diagnosis not present

## 2019-10-27 DIAGNOSIS — Z7901 Long term (current) use of anticoagulants: Secondary | ICD-10-CM | POA: Diagnosis not present

## 2019-10-27 DIAGNOSIS — N186 End stage renal disease: Secondary | ICD-10-CM | POA: Insufficient documentation

## 2019-10-27 DIAGNOSIS — R531 Weakness: Secondary | ICD-10-CM

## 2019-10-27 DIAGNOSIS — Z992 Dependence on renal dialysis: Secondary | ICD-10-CM | POA: Insufficient documentation

## 2019-10-27 DIAGNOSIS — R05 Cough: Secondary | ICD-10-CM | POA: Diagnosis not present

## 2019-10-27 DIAGNOSIS — Z20822 Contact with and (suspected) exposure to covid-19: Secondary | ICD-10-CM

## 2019-10-27 DIAGNOSIS — Z87891 Personal history of nicotine dependence: Secondary | ICD-10-CM | POA: Diagnosis not present

## 2019-10-27 LAB — CBC WITH DIFFERENTIAL/PLATELET
Abs Immature Granulocytes: 0.02 10*3/uL (ref 0.00–0.07)
Basophils Absolute: 0 10*3/uL (ref 0.0–0.1)
Basophils Relative: 0 %
Eosinophils Absolute: 0 10*3/uL (ref 0.0–0.5)
Eosinophils Relative: 0 %
HCT: 38.7 % — ABNORMAL LOW (ref 39.0–52.0)
Hemoglobin: 12.2 g/dL — ABNORMAL LOW (ref 13.0–17.0)
Immature Granulocytes: 0 %
Lymphocytes Relative: 8 %
Lymphs Abs: 0.6 10*3/uL — ABNORMAL LOW (ref 0.7–4.0)
MCH: 29 pg (ref 26.0–34.0)
MCHC: 31.5 g/dL (ref 30.0–36.0)
MCV: 91.9 fL (ref 80.0–100.0)
Monocytes Absolute: 0.9 10*3/uL (ref 0.1–1.0)
Monocytes Relative: 13 %
Neutro Abs: 5.5 10*3/uL (ref 1.7–7.7)
Neutrophils Relative %: 79 %
Platelets: 190 10*3/uL (ref 150–400)
RBC: 4.21 MIL/uL — ABNORMAL LOW (ref 4.22–5.81)
RDW: 17 % — ABNORMAL HIGH (ref 11.5–15.5)
WBC: 7 10*3/uL (ref 4.0–10.5)
nRBC: 0 % (ref 0.0–0.2)

## 2019-10-27 LAB — COMPREHENSIVE METABOLIC PANEL
ALT: 19 U/L (ref 0–44)
AST: 31 U/L (ref 15–41)
Albumin: 3.1 g/dL — ABNORMAL LOW (ref 3.5–5.0)
Alkaline Phosphatase: 105 U/L (ref 38–126)
Anion gap: 13 (ref 5–15)
BUN: 43 mg/dL — ABNORMAL HIGH (ref 8–23)
CO2: 27 mmol/L (ref 22–32)
Calcium: 9.2 mg/dL (ref 8.9–10.3)
Chloride: 99 mmol/L (ref 98–111)
Creatinine, Ser: 3.66 mg/dL — ABNORMAL HIGH (ref 0.61–1.24)
GFR calc Af Amer: 18 mL/min — ABNORMAL LOW (ref >60)
GFR calc non Af Amer: 15 mL/min — ABNORMAL LOW (ref >60)
Glucose, Bld: 79 mg/dL (ref 70–99)
Potassium: 3 mmol/L — ABNORMAL LOW (ref 3.5–5.1)
Sodium: 139 mmol/L (ref 135–145)
Total Bilirubin: 1.1 mg/dL (ref 0.3–1.2)
Total Protein: 6.7 g/dL (ref 6.5–8.1)

## 2019-10-27 LAB — LACTIC ACID, PLASMA
Lactic Acid, Venous: 1.3 mmol/L (ref 0.5–1.9)
Lactic Acid, Venous: 1.2 mmol/L (ref 0.5–1.9)

## 2019-10-27 LAB — BODY FLUID CELL COUNT WITH DIFFERENTIAL
Lymphs, Fluid: 26 %
Monocyte-Macrophage-Serous Fluid: 72 % (ref 50–90)
Neutrophil Count, Fluid: 1 % (ref 0–25)
Total Nucleated Cell Count, Fluid: 29 cu mm (ref 0–1000)

## 2019-10-27 LAB — PROTIME-INR
INR: 1.6 — ABNORMAL HIGH (ref 0.8–1.2)
Prothrombin Time: 19.3 seconds — ABNORMAL HIGH (ref 11.4–15.2)

## 2019-10-27 LAB — POC SARS CORONAVIRUS 2 AG -  ED: SARS Coronavirus 2 Ag: NEGATIVE

## 2019-10-27 MED ORDER — SODIUM CHLORIDE 0.9% FLUSH
3.0000 mL | Freq: Once | INTRAVENOUS | Status: AC
  Administered 2019-10-27: 3 mL via INTRAVENOUS

## 2019-10-27 MED ORDER — SODIUM CHLORIDE 0.9 % IV SOLN
1.0000 g | Freq: Once | INTRAVENOUS | Status: AC
  Administered 2019-10-27: 1 g via INTRAVENOUS
  Filled 2019-10-27: qty 1

## 2019-10-27 MED ORDER — VANCOMYCIN HCL 10 G IV SOLR
2000.0000 mg | Freq: Once | INTRAVENOUS | Status: DC
  Filled 2019-10-27 (×2): qty 2000

## 2019-10-27 NOTE — ED Provider Notes (Signed)
[facility] EMERGENCY DEPARTMENT Provider Note   CSN: [id number] Arrival date & time: 10/27/19  1108     History   Chief Complaint Chief Complaint  Patient presents with  . Cough  . Emesis  . Fever    HPI Louis [name] is a 73 y.o. male.     The history is provided by the patient and medical records. No language interpreter was used.  Cough Associated symptoms: fever   Emesis Associated symptoms: cough and fever   Fever Associated symptoms: cough and vomiting    Louis [name] is a 73 y.o. male who presents to the Emergency Department complaining of fever and cough. He presents to the emergency department from home for evaluation of fever, cough and malaise since Wednesday of last week. He reports fevers to 101.8 at home. He has nausea. Yesterday he vomited a small amount of sputum. No vomiting since then. He complains of malaise with poor oral intake. He denies any chest pain, shortness of breath, wounds or source. He is anuric and is on home peritoneal dialysis every day. He denies any change in his dialysate quality. He also takes Coumadin. He does go to the dialysis center once weekly for testing. No known COVID 19 exposures. Past Medical History:  Diagnosis Date  . Brittle bones    per pt, has soft bones in right foot/wears boot cast!  . CAD (coronary artery disease)   . Cataract    Bil/ surg scheduled for right eye 01/18/17/ left eye 02/08/17  . Charcot ankle, right 2019  . CHF (congestive heart failure) ([place]) 2015  . Diabetes mellitus   . Heart failure, diastolic ([place])   . Hyperlipidemia   . Hypertension   . Macular edema 2014  . OSA on CPAP   . Personal history of colonic polyps - adenomas 01/28/2014  . Renal insufficiency    being examined for dialysis for home dialysis [facility]  . Shortness of breath dyspnea   . Syncope and collapse     Patient Active Problem List   Diagnosis Date Noted  . Severe obesity (BMI 35.0-39.9) with  comorbidity ([place]) 09/08/2019  . Rectal bleeding 06/05/2019  . Internal hemorrhoids 06/05/2019  . Atrial fibrillation ([place]) 12/23/2018  . Anasarca 12/14/2018  . SOB (shortness of breath) on exertion 12/12/2018  . Bilateral pseudophakia 02/27/2018  . Pre-transplant evaluation for kidney transplant 11/27/2017  . Venous insufficiency (chronic) (peripheral) 09/06/2017  . Diabetic peripheral neuropathy ([place]) 06/12/2017  . Charcot gait 02/02/2017  . Charcot's joint 02/02/2017  . Heart failure, diastolic ([place])   . 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 Diabetic polyneuropathy associated with type 2 diabetes mellitus ([place]) 12/25/2016  . Idiopathic chronic venous hypertension of both lower extremities with inflammation 12/25/2016  . Combined forms of age-related cataract of both eyes 10/19/2016  . Cataract, right 05/18/2016  . Right cervical radiculopathy 05/12/2016  . 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 ([place]) 10/12/2015  . 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 Gout 04/20/2015  . Osteoarthritis of both acromioclavicular joints 12/03/2014  . Left shoulder pain 11/17/2014  . Unstable angina pectoris ([place]) 10/05/2014  . History of colonic polyps 01/28/2014  . Bilateral carpal tunnel syndrome 01/09/2014  . Macular edema, diabetic ([place]) 10/31/2013  . Obesity (BMI 30-39.9) 07/31/2013  . Fatigue 03/06/2012  . Astigmatism 01/11/2012  . Presbyopia 01/11/2012  . CAD (coronary artery disease),  native coronary artery 05/17/2011  . Nonspecific abnormal unspecified cardiovascular function study 03/01/2011  . Bradycardia 02/08/2011  . Right knee pain 02/08/2011  . Hyperlipidemia   . RBBB 02/01/2011  . ESRD (end stage renal disease) ([place]) 10/23/2006  . Proteinuria 10/23/2006  . Obstructive sleep apnea 10/05/2006  . Diabetes mellitus with stage 4 chronic kidney disease  ([place]) 08/28/2006  . HYPERTENSION, BENIGN SYSTEMIC 08/28/2006    Past Surgical History:  Procedure Laterality Date  . CARPAL TUNNEL RELEASE     left hand  . COLONOSCOPY    . INTRAVASCULAR PRESSURE WIRE/FFR STUDY N/A 12/18/2018   Procedure: INTRAVASCULAR PRESSURE WIRE/FFR STUDY;  Surgeon: Wellington Hampshire, MD;  Location: [facility];  Service: Cardiovascular;  Laterality: N/A;  . IR FLUORO GUIDE CV LINE RIGHT  12/16/2018  . IR US GUIDE VASC ACCESS RIGHT  12/16/2018  . KNEE ARTHROSCOPY Right 09/13/2016   [facility], Dr. Dorna Leitz  . PILONIDAL CYST EXCISION    . RIGHT/LEFT HEART CATH AND CORONARY ANGIOGRAPHY N/A 12/18/2018   Procedure: RIGHT/LEFT HEART CATH AND CORONARY ANGIOGRAPHY;  Surgeon: Wellington Hampshire, MD;  Location: [facility];  Service: Cardiovascular;  Laterality: N/A;        Home Medications    Prior to Admission medications   Medication Sig Start Date End Date Taking? Authorizing Provider  allopurinol (ZYLOPRIM) 100 MG tablet TAKE 1 & 1/2 (ONE & ONE-HALF) TABLETS BY MOUTH TWICE DAILY Patient taking differently: Take 150 mg by mouth 2 (two) times daily.  07/16/19  Yes [name], MD  amLODipine (NORVASC) 10 MG tablet Take 1 tablet (10 mg total) by mouth at bedtime. Patient taking differently: Take 10 mg by mouth daily. Pt is taking this in the morning 10/23/18  Yes Crenshaw, Denice Bors, MD  b complex vitamins tablet Take 1 tablet by mouth daily.   Yes [provider]  Cholecalciferol (VITAMIN D3) 5000 units CAPS Take 5,000 Units by mouth daily.   Yes [provider]  Coenzyme Q10 (CO Q 10 PO) Take 1 tablet by mouth daily.   Yes [provider]  furosemide (LASIX) 80 MG tablet Take 80 mg by mouth daily. 10/24/19  Yes [provider]  gabapentin (NEURONTIN) 300 MG capsule Take 1 capsule (300 mg total) by mouth daily. 06/03/19  Yes [name], MD  hydrALAZINE (APRESOLINE) 100 MG tablet Take 1 tablet  (100 mg total) by mouth 3 (three) times daily. 04/01/19  Yes Lelon Perla, MD  isosorbide dinitrate (ISORDIL) 20 MG tablet Take 1 tablet (20 mg total) by mouth 2 (two) times daily. 06/26/19  Yes Lelon Perla, MD  nitroGLYCERIN (NITROSTAT) 0.4 MG SL tablet Place 1 tablet (0.4 mg total) under the tongue every 5 (five) minutes as needed for chest pain. 10/07/14  Yes Almyra Deforest, PA  NOVOLIN N 100 UNIT/ML injection Inject 30 Units into the skin 2 (two) times daily before a meal.  12/05/16  Yes [provider]  NOVOLIN R 100 UNIT/ML injection Inject 15-20 Units into the skin 3 (three) times daily with meals.  02/10/16  Yes [provider]  Potassium Chloride ER 20 MEQ TBCR Take 20 mEq by mouth daily. 10/24/19  Yes [provider]  rosuvastatin (CRESTOR) 40 MG tablet Take 1 tablet (40 mg total) by mouth daily. 08/19/19  Yes Lelon Perla, MD  VITAMIN E PO Take 1 capsule by mouth daily.   Yes [provider]  warfarin (COUMADIN) 5 MG  tablet TAKE 1 TABLET BY MOUTH ONCE DAILY AT 6 PM. MONITOR INR. INR GOAL SHOULD BE BETWEEN 2-3. Patient taking differently: Take 5 mg by mouth daily at 6 PM.  07/17/19  Yes Metheney, Rene Kocher, MD  zinc sulfate 220 (50 Zn) MG capsule Take 220 mg by mouth daily.   Yes [provider]  AMBULATORY NON FORMULARY MEDICATION Rollator walker with seat. Dx: R11.657, M54.12, M10.00 05/17/18   [name], MD  AMBULATORY NON FORMULARY MEDICATION Continuous positive airway pressure (CPAP) device: Auto titrate minimum 4 cm H20 to maximum of 20 cm H2O with pressure. Please provide all supplemental supplies as needed. Fax to: [phone] 05/30/18   Emeterio Reeve, DO  B-D INS SYR ULTRAFINE 1CC/31G 31G X 5/16" 1 ML MISC  02/06/17   [provider]  ONE TOUCH ULTRA TEST test strip USE TO CHECK BLOOD SUGAR 4 TIMES A DAY DX E11.65 12/31/18   [name], MD    Family History Family History  Problem Relation Age of  Onset  . Diabetes Mother   . Kidney disease Mother   . Diabetes Father   . Coronary artery disease Father   . Diabetes Brother   . Diabetes Sister   . Prostate cancer Maternal Uncle   . Diabetes Maternal Grandmother   . Cancer Paternal Grandmother        unknown  . Heart attack Paternal Grandfather   . Colon cancer Neg Hx     Social History Social History   Tobacco Use  . Smoking status: Former Smoker    Packs/day: 0.50    Years: 20.00    Pack years: 10.00    Types: Cigarettes    Quit date: 03/06/1977    Years since quitting: 42.6  . Smokeless tobacco: Never Used  Substance Use Topics  . Alcohol use: No  . Drug use: No     Allergies   Hydrocodone, Oxycodone, Dacarbazine, and Tape   Review of Systems Review of Systems  Constitutional: Positive for fever.  Respiratory: Positive for cough.   Gastrointestinal: Positive for vomiting.  All other systems reviewed and are negative.    Physical Exam Updated Vital Signs BP (!) 141/63 (BP Location: Right Arm)   Pulse 88   Temp 99.4 F (37.4 C) Comment: had tylenol around 10 pta  Resp 16   Ht _0  (1.702 m)   Wt 99.8 kg   SpO2 96%   BMI 34.46 kg/m   Physical Exam Vitals signs and nursing note reviewed.  Constitutional:      Appearance: He is well-developed.  HENT:     Head: Normocephalic and atraumatic.  Cardiovascular:     Rate and Rhythm: Normal rate and regular rhythm.     Heart sounds: No murmur.  Pulmonary:     Effort: Pulmonary effort is normal. No respiratory distress.     Breath sounds: Normal breath sounds.  Abdominal:     Palpations: Abdomen is soft.     Tenderness: There is no guarding or rebound.     Comments: PD catheter in place in the abdominal wall with no surrounding erythema or induration. Minimal generalized abdominal tenderness  Musculoskeletal:        General: No tenderness.     Comments: One plus pitting edema to bilateral lower extremities  Skin:    General: Skin is warm and  dry.  Neurological:     Mental Status: He is alert and oriented to person, place, and time.  Psychiatric:  Behavior: Behavior normal.      ED Treatments / Results  Labs (all labs ordered are listed, but only abnormal results are displayed) Labs Reviewed  COMPREHENSIVE METABOLIC PANEL - Abnormal; Notable for the following components:      Result Value   Potassium 3.0 (*)    BUN 43 (*)    Creatinine, Ser 3.66 (*)    Albumin 3.1 (*)    GFR calc non Af Amer 15 (*)    GFR calc Af Amer 18 (*)    All other components within normal limits  CBC WITH DIFFERENTIAL/PLATELET - Abnormal; Notable for the following components:   RBC 4.21 (*)    Hemoglobin 12.2 (*)    HCT 38.7 (*)    RDW 17.0 (*)    Lymphs Abs 0.6 (*)    All other components within normal limits  PROTIME-INR - Abnormal; Notable for the following components:   Prothrombin Time 19.3 (*)    INR 1.6 (*)    All other components within normal limits  BODY FLUID CELL COUNT WITH DIFFERENTIAL - Abnormal; Notable for the following components:   Color, Fluid COLORLESS (*)    Appearance, Fluid HAZY (*)    All other components within normal limits  BODY FLUID CULTURE  CULTURE, BLOOD (ROUTINE X 2)  CULTURE, BLOOD (ROUTINE X 2)  SARS CORONAVIRUS 2 (TAT 6-24 HRS)  LACTIC ACID, PLASMA  LACTIC ACID, PLASMA  URINALYSIS, ROUTINE W REFLEX MICROSCOPIC  POC SARS CORONAVIRUS 2 AG -  ED  CYTOLOGY - NON PAP    EKG None  Radiology Dg Chest Portable 1 View  Result Date: 10/27/2019 CLINICAL DATA:  Cough and fever since Wednesday. EXAM: PORTABLE CHEST 1 VIEW COMPARISON:  Two-view chest x-ray 12/12/2018 FINDINGS: Heart is enlarged. Lung volumes are low. Previously seen pleural effusions are not present. No focal airspace disease is present. There is no edema. Visualized soft tissues and bony thorax are unremarkable. IMPRESSION: 1. Cardiomegaly without failure. 2. No focal airspace disease. 3. Previously seen pleural effusions have  resolved. Electronically Signed   By: San Morelle M.D.   On: 10/27/2019 17:07    Procedures Procedures (including critical care time)  Medications Ordered in ED Medications  vancomycin (VANCOCIN) 2,000 mg in sodium chloride 0.9 % 500 mL IVPB (2,000 mg Intravenous Not Given 10/27/19 2232)  sodium chloride flush (NS) 0.9 % injection 3 mL (3 mLs Intravenous Given 10/27/19 1708)  cefTAZidime (FORTAZ) 1 g in sodium chloride 0.9 % 100 mL IVPB (0 g Intravenous Stopped 10/27/19 2327)     Initial Impression / Assessment and Plan / ED Course  I have reviewed the triage vital signs and the nursing notes.  Pertinent labs & imaging results that were available during my care of the patient were reviewed by me and considered in my medical decision making (see chart for details).       Patient with ESR D on peritoneal dialysis here for evaluation of fever, cough, vomiting. He is chronically ill appearing on evaluation but in no acute distress. He does have mild abdominal tenderness without peritoneal findings. Labs demonstrate stable renal insufficiency. Chest x-ray without evidence of pneumonia. Rapid COVID is negative. Given his PD and mild abdominal tenderness dialysate was withdrawn. He was treated with one-time dose of ceftaz pending PD results. Discussed with patient recommendation for observation given his febrile state and relative health risks, unclear source. Patient declines admission. Discussed possible COVID-19 infection. Discussed home isolation, outpatient follow-up and return precautions.  Louis Pare  [name] was evaluated in Emergency Department on 10/28/2019 for the symptoms described in the history of present illness. He was evaluated in the context of the global COVID-19 pandemic, which necessitated consideration that the patient might be at risk for infection with the SARS-CoV-2 virus that causes COVID-19. Institutional protocols and algorithms that pertain to the evaluation of patients  at risk for COVID-19 are in a state of rapid change based on information released by regulatory bodies including the CDC and federal and state organizations. These policies and algorithms were followed during the patient's care in the ED.  Final Clinical Impressions(s) / ED Diagnoses   Final diagnoses:  Fever, unspecified fever cause  Suspected 2019 novel coronavirus infection    ED Discharge Orders    None       Quintella Reichert, MD 10/28/19 0013

## 2019-10-27 NOTE — ED Notes (Signed)
Loden Laurent (wife ) [phone] call for update.

## 2019-10-27 NOTE — Progress Notes (Signed)
Virtual Visit via Video (App used: Telephone) Note  I connected with      [name] on 10/27/19 at 9:36 AM  by a telemedicine application and verified that I am speaking with the correct person using two identifiers.  Patient is at his residence in [place], [place] I am in office   I discussed the limitations of evaluation and management by telemedicine and the availability of in person appointments. The patient expressed understanding and agreed to proceed.  History of Present Illness: Louis [name] is a 73 y.o. male with PMH of CAD, Afib, HFpEF, OSA, DM2, ESRD on PD at home who would like to discuss fever   Wife [name]) is also present on the telephone call and assists in providing history.  Wife reports Eduard Clos started feeling bad on Wednesday last week with headache, dizziness, nausea, malaise, and low-grade fevers 99.20F He developed a fever of 101F yesterday 12/06 and had vomiting x 1 that contained mucus. Tmax 101.10F today. Wife just gave him 500 mg of Tylenol at 10 am. Patient feels too weak to get dressed or step on the scale today. He has a chronic cough that is unchanged. Denies SOB or chest pain.  Wife reports he has swelling in his ankles that is unchanged Wife states they removed 1700 ml overnight with PD (typical is 1500-1700 ml) He has a history of CHF exacerbation, last exacerbation was January 2020 Wife states dry weight is 220 lb and he is at his dry weight as of yesterday Dr. Jannifer Hick is his nephrologist  Patient denies any sick contacts. Denies known COVID-19 exposure. Granddaughters were over the house earlier in the week and reportedly "had the sniffles" and wore masks while visiting inside.    Observations/Objective: BP (!) 143/71   Pulse 81   Temp (!) 101.8 F (38.8 C) (Oral)   Wt 220 lb 3.2 oz (99.9 kg)   BMI 34.49 kg/m  BP Readings from Last 3 Encounters:  10/27/19 (!) 143/71  09/26/19 (!) 144/65  09/24/19 129/74   Wt Readings from  Last 3 Encounters:  10/27/19 220 lb 3.2 oz (99.9 kg)  09/24/19 238 lb (108 kg)  09/08/19 235 lb (106.6 kg)    Exam Limited by Telephone Home VS reviewed. Weight is from 12/06 Normal Speech   Lab and Radiology Results No results found for this or any previous visit (from the past 32 hour(s)). No results found.    Assessment and Plan: 73 y.o. male with There were no encounter diagnoses.  .Diagnoses and all orders for this visit:  Acute febrile illness  Weakness generalized   Patient referred to [facility] ED for further evaluation due to significant co-morbidities Wife will transport patient via private vehicle Called and gave report to charge RN at [facility] at 10:14 am [facility] Nephrology office and advised on disposition  PDMP not reviewed this encounter. No orders of the defined types were placed in this encounter.  No orders of the defined types were placed in this encounter.  There are no Patient Instructions on file for this visit.  Instructions sent via MyChart. If MyChart not available, pt was given option for info via personal e-mail w/ no guarantee of protected health info over unsecured e-mail communication, and MyChart sign-up instructions were sent to patient.   Follow Up Instructions: No follow-ups on file.    I discussed the assessment and treatment plan with the patient. The patient was provided an opportunity to ask questions  and all were answered. The patient agreed with the plan and demonstrated an understanding of the instructions.   The patient was advised to call back or seek an in-person evaluation if any new concerns, if symptoms worsen or if the condition fails to improve as anticipated.  20 minutes of non-face-to-face time was provided during this encounter.      . . . . . . . . . . . . . Marland Kitchen                   Historical information moved to improve visibility of documentation.  Past Medical History:   Diagnosis Date  . Brittle bones    per pt, has soft bones in right foot/wears boot cast!  . CAD (coronary artery disease)   . Cataract    Bil/ surg scheduled for right eye 01/18/17/ left eye 02/08/17  . Charcot ankle, right 2019  . CHF (congestive heart failure) ([place]) 2015  . Diabetes mellitus   . Heart failure, diastolic ([place])   . Hyperlipidemia   . Hypertension   . Macular edema 2014  . OSA on CPAP   . Personal history of colonic polyps - adenomas 01/28/2014  . Renal insufficiency    being examined for dialysis for home dialysis [facility]  . Shortness of breath dyspnea   . Syncope and collapse    Past Surgical History:  Procedure Laterality Date  . CARPAL TUNNEL RELEASE     left hand  . COLONOSCOPY    . INTRAVASCULAR PRESSURE WIRE/FFR STUDY N/A 12/18/2018   Procedure: INTRAVASCULAR PRESSURE WIRE/FFR STUDY;  Surgeon: Wellington Hampshire, MD;  Location: [facility];  Service: Cardiovascular;  Laterality: N/A;  . IR FLUORO GUIDE CV LINE RIGHT  12/16/2018  . IR US GUIDE VASC ACCESS RIGHT  12/16/2018  . KNEE ARTHROSCOPY Right 09/13/2016   [facility], Dr. Dorna Leitz  . PILONIDAL CYST EXCISION    . RIGHT/LEFT HEART CATH AND CORONARY ANGIOGRAPHY N/A 12/18/2018   Procedure: RIGHT/LEFT HEART CATH AND CORONARY ANGIOGRAPHY;  Surgeon: Wellington Hampshire, MD;  Location: [facility];  Service: Cardiovascular;  Laterality: N/A;   Social History   Tobacco Use  . Smoking status: Former Smoker    Packs/day: 0.50    Years: 20.00    Pack years: 10.00    Types: Cigarettes    Quit date: 03/06/1977    Years since quitting: 42.6  . Smokeless tobacco: Never Used  Substance Use Topics  . Alcohol use: No   family history includes Cancer in his paternal grandmother; Coronary artery disease in his father; Diabetes in his brother, father, maternal grandmother, mother, and sister; Heart attack in his paternal grandfather; Kidney disease in his mother; Prostate cancer in his  maternal uncle.  Medications: Current Outpatient Medications  Medication Sig Dispense Refill  . allopurinol (ZYLOPRIM) 100 MG tablet TAKE 1 & 1/2 (ONE & ONE-HALF) TABLETS BY MOUTH TWICE DAILY 270 tablet 0  . AMBULATORY NON FORMULARY MEDICATION Rollator walker with seat. Dx: M25.561, M54.12, M10.00 1 each 0  . AMBULATORY NON FORMULARY MEDICATION Continuous positive airway pressure (CPAP) device: Auto titrate minimum 4 cm H20 to maximum of 20 cm H2O with pressure. Please provide all supplemental supplies as needed. Fax to: [phone] 1 Units 1  . amLODipine (NORVASC) 10 MG tablet Take 1 tablet (10 mg total) by mouth at bedtime. (Patient taking differently: Take 10 mg by mouth daily. Pt is taking this in the morning) 90 tablet 3  .  B-D INS SYR ULTRAFINE 1CC/31G 31G X 5/16" 1 ML MISC     . Cholecalciferol (VITAMIN D3) 5000 units CAPS Take 5,000 Units by mouth daily.    . Coenzyme Q10 (CO Q 10 PO) Take 1 tablet by mouth daily.    Marland Kitchen gabapentin (NEURONTIN) 300 MG capsule Take 1 capsule (300 mg total) by mouth daily. 90 capsule 1  . hydrALAZINE (APRESOLINE) 100 MG tablet Take 1 tablet (100 mg total) by mouth 3 (three) times daily. 270 tablet 3  . isosorbide dinitrate (ISORDIL) 20 MG tablet Take 1 tablet (20 mg total) by mouth 2 (two) times daily. 180 tablet 3  . nitroGLYCERIN (NITROSTAT) 0.4 MG SL tablet Place 1 tablet (0.4 mg total) under the tongue every 5 (five) minutes as needed for chest pain. 25 tablet 0  . NOVOLIN N 100 UNIT/ML injection Inject 25 Units into the skin 2 (two) times daily before a meal.     . NOVOLIN R 100 UNIT/ML injection Inject 15-20 Units into the skin 3 (three) times daily with meals.   5  . ONE TOUCH ULTRA TEST test strip USE TO CHECK BLOOD SUGAR 4 TIMES A DAY DX E11.65 300 each 11  . rosuvastatin (CRESTOR) 40 MG tablet Take 1 tablet (40 mg total) by mouth daily. 90 tablet 3  . warfarin (COUMADIN) 5 MG tablet TAKE 1 TABLET BY MOUTH ONCE DAILY AT 6 PM. MONITOR INR. INR GOAL  SHOULD BE BETWEEN 2-3. 90 tablet 0  . furosemide (LASIX) 80 MG tablet Take 80 mg by mouth daily.     No current facility-administered medications for this visit.    Allergies  Allergen Reactions  . Hydrocodone Nausea And Vomiting    Other reaction(s): GI Upset (intolerance) Projectile vomiting Projectile vomiting  . Oxycodone Nausea And Vomiting    Other reaction(s): GI Upset (intolerance), Vomiting (intolerance) Projectile vomiting Projectile vomiting  . Dacarbazine     Other reaction(s): Unknown  . Tape Dermatitis, Itching and Rash    Patch used at dialysis

## 2019-10-27 NOTE — ED Notes (Signed)
Update given to Wife

## 2019-10-27 NOTE — ED Triage Notes (Signed)
Pt states fever, chills, cough and vomiting since Wednesday. Denies any recent sick contacts.

## 2019-10-27 NOTE — Progress Notes (Signed)
Pharmacy Antibiotic Note  Louis [name] is a 73 y.o. male admitted on 10/27/2019 with SBP.  Pharmacy has been consulted for Vancomycin dosing.  Plan: - PD patient with concerns for PD. PD fluid has been collected and cultured. Spoke with ED MD and the patient does not wish to stay. At this time will give one time doses and patient will follow up with Neph in the AM.  - Vancomycin 2000mg  IV x 1 dose   Height: 5\' 7"  (170.2 cm) Weight: 220 lb (99.8 kg) IBW/kg (Calculated) : 66.1  Temp (24hrs), Avg:100.6 F (38.1 C), Min:99.4 F (37.4 C), Max:101.8 F (38.8 C)  Recent Labs  Lab 10/27/19 1211 10/27/19 1658  WBC 7.0  --   CREATININE 3.66*  --   LATICACIDVEN 1.3 1.2    Estimated Creatinine Clearance: 20.2 mL/min (A) (by C-G formula based on SCr of 3.66 mg/dL (H)).    Allergies  Allergen Reactions  . Hydrocodone Nausea And Vomiting    Other reaction(s): GI Upset (intolerance) Projectile vomiting Projectile vomiting  . Oxycodone Nausea And Vomiting    Other reaction(s): GI Upset (intolerance), Vomiting (intolerance) Projectile vomiting Projectile vomiting  . Dacarbazine Other (See Comments)    Other reaction(s): Unknown  . Tape Dermatitis, Itching and Rash    Patch used at dialysis    Antimicrobials this admission: 12/7 Ceftazidime >>  12/7 Vancomycin  >>  Dose adjustments this admission:   Microbiology results:  Thank you for allowing pharmacy to be a part of this patient's care.  Duanne Limerick PharmD. BCPS  10/27/2019 9:17 PM

## 2019-10-28 ENCOUNTER — Telehealth: Payer: Self-pay | Admitting: Family Medicine

## 2019-10-28 LAB — SARS CORONAVIRUS 2 (TAT 6-24 HRS): SARS Coronavirus 2: POSITIVE — AB

## 2019-10-28 NOTE — Telephone Encounter (Signed)
Added: Patient also has a Virtual follow up appt with PCP set for 10/29/2019.

## 2019-10-28 NOTE — Telephone Encounter (Signed)
Patient's wife called and stated that patient was seen at [facility] ER and tested positive for COVID on 10/27/2019, had to cancel the INR check on Friday, 10/31/2019 and patient's wife she stated she was concerned about this, due to patient has a bruise on buttock. I spoke with Apolonio Schneiders in Triage and she advised me to tell patient he should be evaluated at Urgent Care due to the bruising on his buttock and having to cancel Friday due to testing positive for COVID. Patient's wife stated that they didn't want to go and wait for a long period of time and I let them know again that the triage nurse strongly advised to be checked out.

## 2019-10-29 ENCOUNTER — Encounter: Payer: Self-pay | Admitting: Family Medicine

## 2019-10-29 ENCOUNTER — Ambulatory Visit (INDEPENDENT_AMBULATORY_CARE_PROVIDER_SITE_OTHER): Payer: Medicare Other | Admitting: Family Medicine

## 2019-10-29 ENCOUNTER — Telehealth: Payer: Self-pay | Admitting: Unknown Physician Specialty

## 2019-10-29 ENCOUNTER — Ambulatory Visit: Payer: Medicare Other

## 2019-10-29 VITALS — BP 147/74 | HR 79 | Temp 100.8°F | Ht 67.0 in | Wt 222.7 lb

## 2019-10-29 DIAGNOSIS — E1122 Type 2 diabetes mellitus with diabetic chronic kidney disease: Secondary | ICD-10-CM | POA: Diagnosis not present

## 2019-10-29 DIAGNOSIS — U071 COVID-19: Secondary | ICD-10-CM | POA: Diagnosis not present

## 2019-10-29 DIAGNOSIS — N185 Chronic kidney disease, stage 5: Secondary | ICD-10-CM | POA: Diagnosis not present

## 2019-10-29 DIAGNOSIS — E11610 Type 2 diabetes mellitus with diabetic neuropathic arthropathy: Secondary | ICD-10-CM | POA: Diagnosis not present

## 2019-10-29 DIAGNOSIS — I2510 Atherosclerotic heart disease of native coronary artery without angina pectoris: Secondary | ICD-10-CM | POA: Diagnosis not present

## 2019-10-29 LAB — CYTOLOGY - NON PAP

## 2019-10-29 NOTE — Assessment & Plan Note (Signed)
On peritoneal dialysis.  Peritoneal culture was negative which is very reassuring.

## 2019-10-29 NOTE — Telephone Encounter (Signed)
7 days of symptoms of fever and fatigue.  Discussed with patient's wife about Covid symptoms and the use of bamlanivimab, a monoclonal antibody infusion for those with mild to moderate Covid symptoms and at a high risk of hospitalization.  Pt is qualified for this infusion at the [facility] infusion center due to Age > 65     After discussing the infusion's costs, potential benefits and side effects, the patient has decided to think about treatment with monoclonal antibodies.

## 2019-10-29 NOTE — Assessment & Plan Note (Signed)
Discussed plan. She is already increased the into 35 units twice a day. She had been giving regular at bedtime pretty consistently without food so encouraged her to stop doing that a second then cause hypoglycemia in the middle the night which can cause a rebound phenomenon in the morning. Lunch blood sugars overall look pretty good.

## 2019-10-29 NOTE — Progress Notes (Signed)
Spoke w/pt's wife and she reports that he still has a slight cough more so in the mornings after he gets up she believes that this mostly comes from him wearing the CPAP and having some drainage along with it.   She stated that he is weak and has little to no energy. He has a loss of appetite but hasn't lost his sense of smell or taste. The dizziness,headache and nausea have all subsided.   He is still running a fever. Last night his temperature was 99.0. she said that he usually runs between 94-96 degrees.   She informed me that he takes 35 U of the Novolin N due to the dialysis that causes a spike in his BS and since she has increased his dose his BS have been much better.   She is concerned about his coumadin he takes 5 mg M,W,Sun and 2.5 mg all other days. He is due for his next INR check 10/31/2019. I advised that she continue to give him his scheduled doses and to look for any changes not associated with how he is feeling now and that Dr. Madilyn Fireman will advised her to how to proceed.

## 2019-10-29 NOTE — Progress Notes (Signed)
Virtual Visit via Video Note  I connected with Louis [name] on 10/29/19 at  9:10 AM EST by a video enabled telemedicine application and verified that I am speaking with the correct person using two identifiers.  Patient was unable to get the video component to connect.  So converted to telephone call.   I discussed the limitations of evaluation and management by telemedicine and the availability of in person appointments. The patient expressed understanding and agreed to proceed.    Established Patient Office Visit  Subjective:  Patient ID: Louis [name], male    DOB: [birth year]  Age: 73 y.o. MRN: [id number]  CC:  Chief Complaint  Patient presents with  . Hospitalization Follow-up    HPI Louis [name] presents for follow-up emergency department visit.  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  He also complained of headache, dizziness, nausea, malaise and with fever to 101.8.  He has a chronic cough that was unchanged and denied any shortness of breath or chest pain at the time.  He was referred to the emergency department the same day for further work-up.  He was given IV vancomycin as well as ceftazidime and tested for Covid which came back positive.  White blood cell count was normal.  Peritoneal fluid and blood cultures sent.  Fluid culture negative thus far.  Blood cultures negative thus far.  He is following up by phone today.   Spoke w/pt's wife and she reports that he still has a slight cough more so in the mornings after he gets up she believes that this mostly comes from him wearing the CPAP and having some drainage along with it.   She stated that he is weak and has little to no energy. He has a loss of appetite but hasn't lost his sense of smell or taste. The dizziness,headache and nausea have all subsided. Limited to 32 oz a day.    He is still running a fever. Last night his temperature  was 99.0. she said that he usually runs between 94-96 degrees.  His wife had also dropped off his glucose log for me to take a look at.  His wife says she is already increased his long-acting to 35 twice a day which is up from 30 units.  Past Medical History:  Diagnosis Date  . Brittle bones    per pt, has soft bones in right foot/wears boot cast!  . CAD (coronary artery disease)   . Cataract    Bil/ surg scheduled for right eye 01/18/17/ left eye 02/08/17  . Charcot ankle, right 2019  . CHF (congestive heart failure) ([place]) 2015  . Diabetes mellitus   . Heart failure, diastolic ([place])   . Hyperlipidemia   . Hypertension   . Macular edema 2014  . OSA on CPAP   . Personal history of colonic polyps - adenomas 01/28/2014  . Renal insufficiency    being examined for dialysis for home dialysis [facility]  . Shortness of breath dyspnea   . Syncope and collapse     Past Surgical History:  Procedure Laterality Date  . CARPAL TUNNEL RELEASE     left hand  . COLONOSCOPY    . INTRAVASCULAR PRESSURE WIRE/FFR STUDY N/A 12/18/2018   Procedure: INTRAVASCULAR PRESSURE WIRE/FFR STUDY;  Surgeon: Wellington Hampshire, MD;  Location: [facility];  Service: Cardiovascular;  Laterality: N/A;  . IR FLUORO GUIDE CV LINE RIGHT  12/16/2018  . IR US GUIDE VASC ACCESS RIGHT  12/16/2018  . KNEE ARTHROSCOPY Right 09/13/2016   [facility], Dr. Dorna Leitz  . PILONIDAL CYST EXCISION    . RIGHT/LEFT HEART CATH AND CORONARY ANGIOGRAPHY N/A 12/18/2018   Procedure: RIGHT/LEFT HEART CATH AND CORONARY ANGIOGRAPHY;  Surgeon: Wellington Hampshire, MD;  Location: [place];  Service: Cardiovascular;  Laterality: N/A;    Family History  Problem Relation Age of Onset  . Diabetes Mother   . Kidney disease Mother   . Diabetes Father   . Coronary artery disease Father   . Diabetes Brother   . Diabetes Sister   . Prostate cancer Maternal Uncle   . Diabetes Maternal Grandmother   . Cancer Paternal  Grandmother        unknown  . Heart attack Paternal Grandfather   . Colon cancer Neg Hx     Social History   Socioeconomic History  . Marital status: Married    Spouse name: [name]  . Number of children: 5  . Years of education: 84  . Highest education level: 12th grade  Occupational History  . Occupation: Warehouse    Comment: retired  Scientific laboratory technician  . Financial resource strain: Not hard at all  . Food insecurity    Worry: Never true    Inability: Never true  . Transportation needs    Medical: No    Non-medical: No  Tobacco Use  . Smoking status: Former Smoker    Packs/day: 0.50    Years: 20.00    Pack years: 10.00    Types: Cigarettes    Quit date: 03/06/1977    Years since quitting: 42.6  . Smokeless tobacco: Never Used  Substance and Sexual Activity  . Alcohol use: No  . Drug use: No  . Sexual activity: Not Currently  Lifestyle  . Physical activity    Days per week: 0 days    Minutes per session: 0 min  . Stress: Not at all  Relationships  . Social Herbalist on phone: Twice a week    Gets together: Twice a week    Attends religious service: More than 4 times per year    Active member of club or organization: No    Attends meetings of clubs or organizations: Never    Relationship status: Married  . Intimate partner violence    Fear of current or ex partner: No    Emotionally abused: No    Physically abused: No    Forced sexual activity: No  Other Topics Concern  . Not on file  Social History Narrative   Drinks a cup of coffee daily. Drinks a lot of water daily. Goes out with church members during the week    Outpatient Medications Prior to Visit  Medication Sig Dispense Refill  . allopurinol (ZYLOPRIM) 100 MG tablet TAKE 1 & 1/2 (ONE & ONE-HALF) TABLETS BY MOUTH TWICE DAILY (Patient taking differently: Take 150 mg by mouth 2 (two) times daily. ) 270 tablet 0  . AMBULATORY NON FORMULARY MEDICATION Rollator walker with seat. Dx: M25.561,  M54.12, M10.00 1 each 0  . AMBULATORY NON FORMULARY MEDICATION Continuous positive airway pressure (CPAP) device: Auto titrate minimum 4 cm H20 to maximum of 20 cm H2O with pressure. Please provide all supplemental supplies as needed. Fax to: [phone] 1 Units 1  . amLODipine (NORVASC) 10 MG tablet Take 1 tablet (10 mg total) by mouth at bedtime. (Patient taking differently: Take  10 mg by mouth daily. Pt is taking this in the morning) 90 tablet 3  . B-D INS SYR ULTRAFINE 1CC/31G 31G X 5/16" 1 ML MISC     . blood glucose meter kit and supplies Use to check blood sugars daily E11.65    . Cholecalciferol (VITAMIN D3) 5000 units CAPS Take 5,000 Units by mouth daily.    . Coenzyme Q10 (CO Q 10 PO) Take 1 tablet by mouth daily.    . furosemide (LASIX) 80 MG tablet Take 80 mg by mouth daily.    Marland Kitchen gabapentin (NEURONTIN) 300 MG capsule Take 1 capsule (300 mg total) by mouth daily. 90 capsule 1  . hydrALAZINE (APRESOLINE) 100 MG tablet Take 1 tablet (100 mg total) by mouth 3 (three) times daily. 270 tablet 3  . isosorbide dinitrate (ISORDIL) 20 MG tablet Take 1 tablet (20 mg total) by mouth 2 (two) times daily. 180 tablet 3  . nitroGLYCERIN (NITROSTAT) 0.4 MG SL tablet Place 1 tablet (0.4 mg total) under the tongue every 5 (five) minutes as needed for chest pain. 25 tablet 0  . NOVOLIN N 100 UNIT/ML injection Inject 30 Units into the skin 2 (two) times daily before a meal.     . NOVOLIN R 100 UNIT/ML injection Inject 15-20 Units into the skin 3 (three) times daily with meals.   5  . ONE TOUCH ULTRA TEST test strip USE TO CHECK BLOOD SUGAR 4 TIMES A DAY DX E11.65 300 each 11  . Potassium Chloride ER 20 MEQ TBCR Take 20 mEq by mouth daily.    . rosuvastatin (CRESTOR) 40 MG tablet Take 1 tablet (40 mg total) by mouth daily. 90 tablet 3  . VITAMIN E PO Take 1 capsule by mouth daily.    Marland Kitchen warfarin (COUMADIN) 5 MG tablet TAKE 1 TABLET BY MOUTH ONCE DAILY AT 6 PM. MONITOR INR. INR GOAL SHOULD BE BETWEEN 2-3.  (Patient taking differently: Take 5 mg by mouth daily at 6 PM. ) 90 tablet 0  . zinc sulfate 220 (50 Zn) MG capsule Take 220 mg by mouth daily.    . Insulin Syringe-Needle U-100 31G X 5/16" 1 ML MISC one each by Other route 4 (four) times daily.    Marland Kitchen b complex vitamins tablet Take 1 tablet by mouth daily.     No facility-administered medications prior to visit.     Allergies  Allergen Reactions  . Hydrocodone Nausea And Vomiting    Other reaction(s): GI Upset (intolerance) Projectile vomiting Projectile vomiting  . Oxycodone Nausea And Vomiting    Other reaction(s): GI Upset (intolerance), Vomiting (intolerance) Projectile vomiting Projectile vomiting  . Dacarbazine Other (See Comments)    Other reaction(s): Unknown  . Tape Dermatitis, Itching and Rash    Patch used at dialysis    ROS Review of Systems    Objective:    Physical Exam  BP (!) 147/74   Pulse 79   Temp (!) 100.8 F (38.2 C)   Ht _0  (1.702 m)   Wt 222 lb 10.6 oz (101 kg)   BMI 34.87 kg/m  Wt Readings from Last 3 Encounters:  10/29/19 222 lb 10.6 oz (101 kg)  10/27/19 220 lb (99.8 kg)  10/27/19 220 lb 3.2 oz (99.9 kg)     There are no preventive care reminders to display for this patient.  There are no preventive care reminders to display for this patient.  Lab Results  Component Value Date   TSH 2.391 02/02/2017  Lab Results  Component Value Date   WBC 7.0 10/27/2019   HGB 12.2 (L) 10/27/2019   HCT 38.7 (L) 10/27/2019   MCV 91.9 10/27/2019   PLT 190 10/27/2019   Lab Results  Component Value Date   NA 139 10/27/2019   K 3.0 (L) 10/27/2019   CO2 27 10/27/2019   GLUCOSE 79 10/27/2019   BUN 43 (H) 10/27/2019   CREATININE 3.66 (H) 10/27/2019   BILITOT 1.1 10/27/2019   ALKPHOS 105 10/27/2019   AST 31 10/27/2019   ALT 19 10/27/2019   PROT 6.7 10/27/2019   ALBUMIN 3.1 (L) 10/27/2019   CALCIUM 9.2 10/27/2019   ANIONGAP 13 10/27/2019   GFR 38.90 (L) 03/10/2011   Lab Results   Component Value Date   CHOL 103 08/19/2019   Lab Results  Component Value Date   HDL 37 (L) 08/19/2019   Lab Results  Component Value Date   LDLCALC 48 08/19/2019   Lab Results  Component Value Date   TRIG 97 08/19/2019   Lab Results  Component Value Date   CHOLHDL 2.8 08/19/2019   Lab Results  Component Value Date   HGBA1C 5.9 (A) 09/08/2019      Assessment & Plan:   Problem List Items Addressed This Visit      Endocrine   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 She is already increased the into 35 units twice a day. She had been giving regular at bedtime pretty consistently without food so encouraged her to stop doing that a second then cause hypoglycemia in the middle the night which can cause a rebound phenomenon in the morning. Lunch blood sugars overall look pretty good.        CKD stage 5 due to type 2 diabetes mellitus ([place])    On peritoneal dialysis.  Peritoneal culture was negative which is very reassuring.       Other Visit Diagnoses    COVID-19    -  Primary      COVID-19 all cultures from the ED were negative.  He is still symptomatic but it does not sound like he has been getting worse.  Gave signs and symptoms for when to return to ED.  Recommend continue to quarantine.  His wife is actually going to get tested today though she is asymptomatic thus far.    No orders of the defined types were placed in this encounter.   Follow-up: Return if symptoms worsen or fail to improve.    I discussed the assessment and treatment plan with the patient. The patient was provided an opportunity to ask questions and all were answered. The patient agreed with the plan and demonstrated an understanding of the instructions.   The patient was advised to call back or seek an in-person evaluation if the symptoms worsen or if the condition fails to improve as anticipated.  Time spent 25 minutes in nonface-to-face  encounter.   [name], MD

## 2019-10-31 ENCOUNTER — Ambulatory Visit: Payer: Medicare Other

## 2019-10-31 ENCOUNTER — Other Ambulatory Visit: Payer: Self-pay | Admitting: Cardiology

## 2019-10-31 ENCOUNTER — Other Ambulatory Visit: Payer: Self-pay | Admitting: Family Medicine

## 2019-10-31 DIAGNOSIS — M10031 Idiopathic gout, right wrist: Secondary | ICD-10-CM

## 2019-10-31 DIAGNOSIS — M1000 Idiopathic gout, unspecified site: Secondary | ICD-10-CM

## 2019-10-31 LAB — BODY FLUID CULTURE: Culture: NO GROWTH

## 2019-10-31 MED ORDER — AMLODIPINE BESYLATE 10 MG PO TABS: 10.0000 mg | ORAL_TABLET | Freq: Every day | ORAL | 6 refills | Status: DC

## 2019-11-01 LAB — CULTURE, BLOOD (ROUTINE X 2)
Culture: NO GROWTH
Culture: NO GROWTH
Special Requests: ADEQUATE
Special Requests: ADEQUATE

## 2019-11-04 ENCOUNTER — Telehealth: Payer: Self-pay | Admitting: Family Medicine

## 2019-11-04 NOTE — Telephone Encounter (Signed)
Mrs. Tan called this afternoon to schedule an INR check for [name]. He tested positive on12/05/2019 for Covid. Mrs.Scorsone was wanting to schedule a nurse visit for the 18th. Is it okay to schedule an in office visit? Mrs. Jorge also said that he has been without fever for 2 days now.

## 2019-11-05 NOTE — Telephone Encounter (Signed)
Pt scheduled.Louis [name], [name], CMA

## 2019-11-05 NOTE — Telephone Encounter (Signed)
Okay okay to schedule for drive-by INR on the [address].

## 2019-11-07 ENCOUNTER — Ambulatory Visit (INDEPENDENT_AMBULATORY_CARE_PROVIDER_SITE_OTHER): Payer: Medicare Other | Admitting: Osteopathic Medicine

## 2019-11-07 ENCOUNTER — Other Ambulatory Visit: Payer: Self-pay

## 2019-11-07 DIAGNOSIS — I4891 Unspecified atrial fibrillation: Secondary | ICD-10-CM | POA: Diagnosis not present

## 2019-11-07 LAB — POCT INR: INR: 3.3 — AB (ref 2.0–3.0)

## 2019-11-07 MED ORDER — WARFARIN SODIUM 5 MG PO TABS: ORAL_TABLET | ORAL | 0 refills | Status: DC

## 2019-11-07 NOTE — Progress Notes (Signed)
Patient presents to clinic for INR check. He reports he was taking 5 mg on Sundays and Wednesdays. He was taking half a tablet the rest of the week. He denies any recent diet changes, no bruising, bleeding and no hospitalizations. Institutions printed for patient on how to take and when to follow up. No other questions at this time.

## 2019-11-09 ENCOUNTER — Other Ambulatory Visit: Payer: Self-pay | Admitting: Family Medicine

## 2019-11-09 DIAGNOSIS — I4891 Unspecified atrial fibrillation: Secondary | ICD-10-CM

## 2019-11-10 ENCOUNTER — Other Ambulatory Visit: Payer: Self-pay

## 2019-11-10 ENCOUNTER — Ambulatory Visit (INDEPENDENT_AMBULATORY_CARE_PROVIDER_SITE_OTHER): Payer: Medicare Other | Admitting: Orthopedic Surgery

## 2019-11-10 ENCOUNTER — Encounter: Payer: Self-pay | Admitting: Orthopedic Surgery

## 2019-11-10 VITALS — Ht 67.0 in | Wt 223.0 lb

## 2019-11-10 DIAGNOSIS — M25562 Pain in left knee: Secondary | ICD-10-CM

## 2019-11-10 DIAGNOSIS — I2510 Atherosclerotic heart disease of native coronary artery without angina pectoris: Secondary | ICD-10-CM

## 2019-11-10 NOTE — Progress Notes (Signed)
Office Visit Note   Patient: Louis [name]           Date of Birth: Nov 24, 1945           MRN: [id number] Visit Date: 11/10/2019              Requested by: [name], [address] [place],  [place] [address] PCP: [name], MD  Chief Complaint  Patient presents with  . Right Foot - Follow-up      HPI: Patient presents in follow-up today he has a history of Charcot on his right foot.  He also had some right knee pain and swelling which is completely resolved and was short-lived he basic he needs a prescription for new shoes and plates for his brace attachment on the right  Assessment & Plan: Visit Diagnoses: No diagnosis found.  Plan: A new prescription was provided to him.  As far as his knee is completely asymptomatic now and only swelled for 1 evening and responded well with some ice if he has any more episodes or if it becomes painful he should follow-up and have x-rays of that knee  Follow-Up Instructions: No follow-ups on file.   Ortho Exam  Patient is alert, oriented, no adenopathy, well-dressed, normal affect, normal respiratory effort. Right foot Charcot arthropathy skin is intact no active flare noted Right knee no cellulitis no effusion there is no tenderness around the joint line he does have varus alignment  Imaging: No results found. No images are attached to the encounter.  Labs: Lab Results  Component Value Date   HGBA1C 5.9 (A) 09/08/2019   HGBA1C 5.5 04/29/2019   HGBA1C 5.7 (A) 12/12/2018   ESRSEDRATE 81 (H) 02/17/2016   ESRSEDRATE 71 (H) 04/20/2015   CRP 7.0 (H) 02/17/2016   LABURIC 2.9 (L) 04/29/2019   LABURIC 5.7 12/12/2018   LABURIC 4.6 06/12/2017   REPTSTATUS 10/31/2019 FINAL 10/27/2019   GRAMSTAIN  10/27/2019    FEW WBC PRESENT, PREDOMINANTLY MONONUCLEAR NO ORGANISMS SEEN    CULT  10/27/2019    NO GROWTH 3 DAYS Performed at [facility], [place] [address] [place], [place] [address]    LABORGA ENTEROCOCCUS SPECIES 01/28/2016     Lab Results  Component Value Date   ALBUMIN 3.1 (L) 10/27/2019   ALBUMIN 3.4 (L) 12/17/2018   ALBUMIN 3.6 11/27/2016   LABURIC 2.9 (L) 04/29/2019   LABURIC 5.7 12/12/2018   LABURIC 4.6 06/12/2017    Lab Results  Component Value Date   MG 2.2 12/17/2018   MG 2.4 12/16/2018   MG 2.0 02/02/2017   Lab Results  Component Value Date   VD25OH 55 02/17/2016    No results found for: PREALBUMIN CBC EXTENDED Latest Ref Rng & Units 10/27/2019 12/19/2018 12/18/2018  WBC 4.0 - 10.5 K/uL 7.0 9.4 -  RBC 4.22 - 5.81 MIL/uL 4.21(L) 4.32 -  HGB 13.0 - 17.0 g/dL 12.2(L) 12.8(L) 12.9(L)  HCT 39.0 - 52.0 % 38.7(L) 39.5 38.0(L)  PLT 150 - 400 K/uL 190 227 -  NEUTROABS 1.7 - 7.7 K/uL 5.5 - -  LYMPHSABS 0.7 - 4.0 K/uL 0.6(L) - -     Body mass index is 34.93 kg/m.  Orders:  No orders of the defined types were placed in this encounter.  No orders of the defined types were placed in this encounter.    Procedures: No procedures performed  Clinical Data: No additional findings.  ROS:  All other systems negative,  except as noted in the HPI. Review of Systems  Objective: Vital Signs: Ht 5\' 7"  (1.702 m)   Wt 223 lb (101.2 kg)   BMI 34.93 kg/m   Specialty Comments:  No specialty comments available.  PMFS History: Patient Active Problem List   Diagnosis Date Noted  . Severe obesity (BMI 35.0-39.9) with comorbidity ([place]) 09/08/2019  . Iron deficiency anemia, unspecified 09/01/2019  . Unspecified jaundice 08/27/2019  . Other long term (current) drug therapy 08/27/2019  . Liver disease, unspecified 08/27/2019  . Bacterial infection, unspecified 07/16/2019  . Rectal bleeding 06/05/2019  . Internal hemorrhoids 06/05/2019  . Poor mobility 05/02/2019  . Hypertensive left ventricular hypertrophy, without heart failure 05/02/2019  . Secondary hyperparathyroidism of renal origin ([place]) 01/25/2019  . Atrial fibrillation ([place]) 12/23/2018  .  Other specified coagulation defects ([place]) 12/23/2018  . Mechanical complication of cardiovascular device 12/23/2018  . Pain, unspecified 12/20/2018  . Anemia in chronic kidney disease 12/20/2018  . Anasarca 12/14/2018  . SOB (shortness of breath) on exertion 12/12/2018  . Bilateral pseudophakia 02/27/2018  . Pre-transplant evaluation for kidney transplant 11/27/2017  . Venous insufficiency (chronic) (peripheral) 09/06/2017  . Diabetic peripheral neuropathy ([place]) 06/12/2017  . Charcot gait 02/02/2017  . Charcot's joint 02/02/2017  . Heart failure, diastolic ([place])   . 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 Diabetic polyneuropathy associated with type 2 diabetes mellitus ([place]) 12/25/2016  . Idiopathic chronic venous hypertension of both lower extremities with inflammation 12/25/2016  . Combined forms of age-related cataract of both eyes 10/19/2016  . Cataract, right 05/18/2016  . Right cervical radiculopathy 05/12/2016  . 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 Gout 04/20/2015  . Osteoarthritis of both acromioclavicular joints 12/03/2014  . Left shoulder pain 11/17/2014  . Unstable angina pectoris ([place]) 10/05/2014  . History of colonic polyps 01/28/2014  . Bilateral carpal tunnel syndrome 01/09/2014  . Macular edema, diabetic ([place]) 10/31/2013  . Obesity (BMI 30-39.9) 07/31/2013  . Fatigue 03/06/2012  . Astigmatism 01/11/2012  . Presbyopia 01/11/2012  . CAD (coronary artery disease), native coronary artery 05/17/2011  . Nonspecific abnormal unspecified cardiovascular function study 03/01/2011  . Bradycardia 02/08/2011  . Right knee pain 02/08/2011  . Hyperlipidemia   . RBBB 02/01/2011  . ESRD (end stage renal disease) ([place]) 10/23/2006  . Proteinuria 10/23/2006  . Obstructive sleep apnea 10/05/2006   . CKD stage 5 due to type 2 diabetes mellitus ([name]) 08/28/2006  . HYPERTENSION, BENIGN SYSTEMIC 08/28/2006   Past Medical History:  Diagnosis Date  . Brittle bones    per pt, has soft bones in right foot/wears boot cast!  . CAD (coronary artery disease)   . Cataract    Bil/ surg scheduled for right eye 01/18/17/ left eye 02/08/17  . Charcot ankle, right 2019  . CHF (congestive heart failure) ([place]) 2015  . Diabetes mellitus   . Heart failure, diastolic ([place])   . Hyperlipidemia   . Hypertension   . Macular edema 2014  . OSA on CPAP   . Personal history of colonic polyps - adenomas 01/28/2014  . Renal insufficiency    being examined for dialysis for home dialysis [facility]  . Shortness of breath dyspnea   . Syncope and collapse     Family History  Problem Relation Age of Onset  . Diabetes Mother   . Kidney disease  Mother   . Diabetes Father   . Coronary artery disease Father   . Diabetes Brother   . Diabetes Sister   . Prostate cancer Maternal Uncle   . Diabetes Maternal Grandmother   . Cancer Paternal Grandmother        unknown  . Heart attack Paternal Grandfather   . Colon cancer Neg Hx     Past Surgical History:  Procedure Laterality Date  . CARPAL TUNNEL RELEASE     left hand  . COLONOSCOPY    . INTRAVASCULAR PRESSURE WIRE/FFR STUDY N/A 12/18/2018   Procedure: INTRAVASCULAR PRESSURE WIRE/FFR STUDY;  Surgeon: Wellington Hampshire, MD;  Location: [facility];  Service: Cardiovascular;  Laterality: N/A;  . IR FLUORO GUIDE CV LINE RIGHT  12/16/2018  . IR US GUIDE VASC ACCESS RIGHT  12/16/2018  . KNEE ARTHROSCOPY Right 09/13/2016   [facility], Dr. Dorna Leitz  . PILONIDAL CYST EXCISION    . RIGHT/LEFT HEART CATH AND CORONARY ANGIOGRAPHY N/A 12/18/2018   Procedure: RIGHT/LEFT HEART CATH AND CORONARY ANGIOGRAPHY;  Surgeon: Wellington Hampshire, MD;  Location: [facility];  Service: Cardiovascular;  Laterality: N/A;   Social History   Occupational  History  . Occupation: Warehouse    Comment: retired  Tobacco Use  . Smoking status: Former Smoker    Packs/day: 0.50    Years: 20.00    Pack years: 10.00    Types: Cigarettes    Quit date: 03/06/1977    Years since quitting: 42.7  . Smokeless tobacco: Never Used  Substance and Sexual Activity  . Alcohol use: No  . Drug use: No  . Sexual activity: Not Currently

## 2019-11-17 ENCOUNTER — Other Ambulatory Visit: Payer: Self-pay

## 2019-11-17 ENCOUNTER — Ambulatory Visit (INDEPENDENT_AMBULATORY_CARE_PROVIDER_SITE_OTHER): Payer: Medicare Other | Admitting: Family Medicine

## 2019-11-17 DIAGNOSIS — U071 COVID-19: Secondary | ICD-10-CM

## 2019-11-17 DIAGNOSIS — I4891 Unspecified atrial fibrillation: Secondary | ICD-10-CM | POA: Diagnosis not present

## 2019-11-17 LAB — POCT INR: INR: 2.6 (ref 2.0–3.0)

## 2019-11-17 NOTE — Progress Notes (Signed)
Pt in today for INR check. Pt reports taking    5 mg on Wednesday and Sundays.2.5 mg  on all others.       Pt reports no change in diet, no recenthospitalizations, no bleeding or bruising, or SOB. Todays INR was  . Results discussed with provider and no changes made. Pt to return in 4 weeks for next INR. Pt notified. Covid swab also done. Pt needs neg covid to return to dialysis.

## 2019-11-18 NOTE — Progress Notes (Signed)
Agree with documentation as above. Recheck in 3 weeks.    [name], MD

## 2019-11-19 LAB — NOVEL CORONAVIRUS, NAA: SARS-CoV-2, NAA: NOT DETECTED

## 2019-11-21 DIAGNOSIS — Z992 Dependence on renal dialysis: Secondary | ICD-10-CM | POA: Diagnosis not present

## 2019-11-21 DIAGNOSIS — N2581 Secondary hyperparathyroidism of renal origin: Secondary | ICD-10-CM | POA: Diagnosis not present

## 2019-11-21 DIAGNOSIS — D509 Iron deficiency anemia, unspecified: Secondary | ICD-10-CM | POA: Diagnosis not present

## 2019-11-21 DIAGNOSIS — E876 Hypokalemia: Secondary | ICD-10-CM | POA: Diagnosis not present

## 2019-11-21 DIAGNOSIS — N186 End stage renal disease: Secondary | ICD-10-CM | POA: Diagnosis not present

## 2019-11-21 DIAGNOSIS — E1122 Type 2 diabetes mellitus with diabetic chronic kidney disease: Secondary | ICD-10-CM | POA: Diagnosis not present

## 2019-11-21 DIAGNOSIS — D631 Anemia in chronic kidney disease: Secondary | ICD-10-CM | POA: Diagnosis not present

## 2019-11-21 DIAGNOSIS — Z4932 Encounter for adequacy testing for peritoneal dialysis: Secondary | ICD-10-CM | POA: Diagnosis not present

## 2019-11-22 DIAGNOSIS — D509 Iron deficiency anemia, unspecified: Secondary | ICD-10-CM | POA: Diagnosis not present

## 2019-11-22 DIAGNOSIS — N186 End stage renal disease: Secondary | ICD-10-CM | POA: Diagnosis not present

## 2019-11-22 DIAGNOSIS — E876 Hypokalemia: Secondary | ICD-10-CM | POA: Diagnosis not present

## 2019-11-22 DIAGNOSIS — Z992 Dependence on renal dialysis: Secondary | ICD-10-CM | POA: Diagnosis not present

## 2019-11-22 DIAGNOSIS — D631 Anemia in chronic kidney disease: Secondary | ICD-10-CM | POA: Diagnosis not present

## 2019-11-22 DIAGNOSIS — Z4932 Encounter for adequacy testing for peritoneal dialysis: Secondary | ICD-10-CM | POA: Diagnosis not present

## 2019-11-23 DIAGNOSIS — Z4932 Encounter for adequacy testing for peritoneal dialysis: Secondary | ICD-10-CM | POA: Diagnosis not present

## 2019-11-23 DIAGNOSIS — D509 Iron deficiency anemia, unspecified: Secondary | ICD-10-CM | POA: Diagnosis not present

## 2019-11-23 DIAGNOSIS — E876 Hypokalemia: Secondary | ICD-10-CM | POA: Diagnosis not present

## 2019-11-23 DIAGNOSIS — Z992 Dependence on renal dialysis: Secondary | ICD-10-CM | POA: Diagnosis not present

## 2019-11-23 DIAGNOSIS — D631 Anemia in chronic kidney disease: Secondary | ICD-10-CM | POA: Diagnosis not present

## 2019-11-23 DIAGNOSIS — N186 End stage renal disease: Secondary | ICD-10-CM | POA: Diagnosis not present

## 2019-11-24 DIAGNOSIS — Z992 Dependence on renal dialysis: Secondary | ICD-10-CM | POA: Diagnosis not present

## 2019-11-24 DIAGNOSIS — D631 Anemia in chronic kidney disease: Secondary | ICD-10-CM | POA: Diagnosis not present

## 2019-11-24 DIAGNOSIS — Z4932 Encounter for adequacy testing for peritoneal dialysis: Secondary | ICD-10-CM | POA: Diagnosis not present

## 2019-11-24 DIAGNOSIS — D509 Iron deficiency anemia, unspecified: Secondary | ICD-10-CM | POA: Diagnosis not present

## 2019-11-24 DIAGNOSIS — E876 Hypokalemia: Secondary | ICD-10-CM | POA: Diagnosis not present

## 2019-11-24 DIAGNOSIS — N186 End stage renal disease: Secondary | ICD-10-CM | POA: Diagnosis not present

## 2019-11-25 DIAGNOSIS — E876 Hypokalemia: Secondary | ICD-10-CM | POA: Diagnosis not present

## 2019-11-25 DIAGNOSIS — E1122 Type 2 diabetes mellitus with diabetic chronic kidney disease: Secondary | ICD-10-CM | POA: Diagnosis not present

## 2019-11-25 DIAGNOSIS — E789 Disorder of lipoprotein metabolism, unspecified: Secondary | ICD-10-CM | POA: Diagnosis not present

## 2019-11-25 DIAGNOSIS — R82998 Other abnormal findings in urine: Secondary | ICD-10-CM | POA: Diagnosis not present

## 2019-11-25 DIAGNOSIS — N186 End stage renal disease: Secondary | ICD-10-CM | POA: Diagnosis not present

## 2019-11-25 DIAGNOSIS — D631 Anemia in chronic kidney disease: Secondary | ICD-10-CM | POA: Diagnosis not present

## 2019-11-25 DIAGNOSIS — Z4932 Encounter for adequacy testing for peritoneal dialysis: Secondary | ICD-10-CM | POA: Diagnosis not present

## 2019-11-25 DIAGNOSIS — Z992 Dependence on renal dialysis: Secondary | ICD-10-CM | POA: Diagnosis not present

## 2019-11-25 DIAGNOSIS — D509 Iron deficiency anemia, unspecified: Secondary | ICD-10-CM | POA: Diagnosis not present

## 2019-11-26 DIAGNOSIS — D631 Anemia in chronic kidney disease: Secondary | ICD-10-CM | POA: Diagnosis not present

## 2019-11-26 DIAGNOSIS — Z992 Dependence on renal dialysis: Secondary | ICD-10-CM | POA: Diagnosis not present

## 2019-11-26 DIAGNOSIS — D509 Iron deficiency anemia, unspecified: Secondary | ICD-10-CM | POA: Diagnosis not present

## 2019-11-26 DIAGNOSIS — E876 Hypokalemia: Secondary | ICD-10-CM | POA: Diagnosis not present

## 2019-11-26 DIAGNOSIS — Z4932 Encounter for adequacy testing for peritoneal dialysis: Secondary | ICD-10-CM | POA: Diagnosis not present

## 2019-11-26 DIAGNOSIS — N186 End stage renal disease: Secondary | ICD-10-CM | POA: Diagnosis not present

## 2019-11-27 DIAGNOSIS — E876 Hypokalemia: Secondary | ICD-10-CM | POA: Diagnosis not present

## 2019-11-27 DIAGNOSIS — Z4932 Encounter for adequacy testing for peritoneal dialysis: Secondary | ICD-10-CM | POA: Diagnosis not present

## 2019-11-27 DIAGNOSIS — N186 End stage renal disease: Secondary | ICD-10-CM | POA: Diagnosis not present

## 2019-11-27 DIAGNOSIS — Z992 Dependence on renal dialysis: Secondary | ICD-10-CM | POA: Diagnosis not present

## 2019-11-27 DIAGNOSIS — D509 Iron deficiency anemia, unspecified: Secondary | ICD-10-CM | POA: Diagnosis not present

## 2019-11-27 DIAGNOSIS — D631 Anemia in chronic kidney disease: Secondary | ICD-10-CM | POA: Diagnosis not present

## 2019-11-28 DIAGNOSIS — D509 Iron deficiency anemia, unspecified: Secondary | ICD-10-CM | POA: Diagnosis not present

## 2019-11-28 DIAGNOSIS — E876 Hypokalemia: Secondary | ICD-10-CM | POA: Diagnosis not present

## 2019-11-28 DIAGNOSIS — Z4932 Encounter for adequacy testing for peritoneal dialysis: Secondary | ICD-10-CM | POA: Diagnosis not present

## 2019-11-28 DIAGNOSIS — D631 Anemia in chronic kidney disease: Secondary | ICD-10-CM | POA: Diagnosis not present

## 2019-11-28 DIAGNOSIS — N186 End stage renal disease: Secondary | ICD-10-CM | POA: Diagnosis not present

## 2019-11-28 DIAGNOSIS — Z992 Dependence on renal dialysis: Secondary | ICD-10-CM | POA: Diagnosis not present

## 2019-11-29 ENCOUNTER — Encounter: Payer: Self-pay | Admitting: Family Medicine

## 2019-11-29 DIAGNOSIS — D509 Iron deficiency anemia, unspecified: Secondary | ICD-10-CM | POA: Diagnosis not present

## 2019-11-29 DIAGNOSIS — E876 Hypokalemia: Secondary | ICD-10-CM | POA: Diagnosis not present

## 2019-11-29 DIAGNOSIS — D631 Anemia in chronic kidney disease: Secondary | ICD-10-CM | POA: Diagnosis not present

## 2019-11-29 DIAGNOSIS — Z4932 Encounter for adequacy testing for peritoneal dialysis: Secondary | ICD-10-CM | POA: Diagnosis not present

## 2019-11-29 DIAGNOSIS — Z992 Dependence on renal dialysis: Secondary | ICD-10-CM | POA: Diagnosis not present

## 2019-11-29 DIAGNOSIS — N186 End stage renal disease: Secondary | ICD-10-CM | POA: Diagnosis not present

## 2019-11-30 DIAGNOSIS — E876 Hypokalemia: Secondary | ICD-10-CM | POA: Diagnosis not present

## 2019-11-30 DIAGNOSIS — Z4932 Encounter for adequacy testing for peritoneal dialysis: Secondary | ICD-10-CM | POA: Diagnosis not present

## 2019-11-30 DIAGNOSIS — D509 Iron deficiency anemia, unspecified: Secondary | ICD-10-CM | POA: Diagnosis not present

## 2019-11-30 DIAGNOSIS — N186 End stage renal disease: Secondary | ICD-10-CM | POA: Diagnosis not present

## 2019-11-30 DIAGNOSIS — D631 Anemia in chronic kidney disease: Secondary | ICD-10-CM | POA: Diagnosis not present

## 2019-11-30 DIAGNOSIS — Z992 Dependence on renal dialysis: Secondary | ICD-10-CM | POA: Diagnosis not present

## 2019-12-01 DIAGNOSIS — D631 Anemia in chronic kidney disease: Secondary | ICD-10-CM | POA: Diagnosis not present

## 2019-12-01 DIAGNOSIS — N186 End stage renal disease: Secondary | ICD-10-CM | POA: Diagnosis not present

## 2019-12-01 DIAGNOSIS — D509 Iron deficiency anemia, unspecified: Secondary | ICD-10-CM | POA: Diagnosis not present

## 2019-12-01 DIAGNOSIS — Z992 Dependence on renal dialysis: Secondary | ICD-10-CM | POA: Diagnosis not present

## 2019-12-01 DIAGNOSIS — E876 Hypokalemia: Secondary | ICD-10-CM | POA: Diagnosis not present

## 2019-12-01 DIAGNOSIS — Z4932 Encounter for adequacy testing for peritoneal dialysis: Secondary | ICD-10-CM | POA: Diagnosis not present

## 2019-12-02 DIAGNOSIS — N186 End stage renal disease: Secondary | ICD-10-CM | POA: Diagnosis not present

## 2019-12-02 DIAGNOSIS — Z4932 Encounter for adequacy testing for peritoneal dialysis: Secondary | ICD-10-CM | POA: Diagnosis not present

## 2019-12-02 DIAGNOSIS — E876 Hypokalemia: Secondary | ICD-10-CM | POA: Diagnosis not present

## 2019-12-02 DIAGNOSIS — D631 Anemia in chronic kidney disease: Secondary | ICD-10-CM | POA: Diagnosis not present

## 2019-12-02 DIAGNOSIS — D509 Iron deficiency anemia, unspecified: Secondary | ICD-10-CM | POA: Diagnosis not present

## 2019-12-02 DIAGNOSIS — Z992 Dependence on renal dialysis: Secondary | ICD-10-CM | POA: Diagnosis not present

## 2019-12-03 DIAGNOSIS — N186 End stage renal disease: Secondary | ICD-10-CM | POA: Diagnosis not present

## 2019-12-03 DIAGNOSIS — E876 Hypokalemia: Secondary | ICD-10-CM | POA: Diagnosis not present

## 2019-12-03 DIAGNOSIS — D509 Iron deficiency anemia, unspecified: Secondary | ICD-10-CM | POA: Diagnosis not present

## 2019-12-03 DIAGNOSIS — Z992 Dependence on renal dialysis: Secondary | ICD-10-CM | POA: Diagnosis not present

## 2019-12-03 DIAGNOSIS — D631 Anemia in chronic kidney disease: Secondary | ICD-10-CM | POA: Diagnosis not present

## 2019-12-03 DIAGNOSIS — Z4932 Encounter for adequacy testing for peritoneal dialysis: Secondary | ICD-10-CM | POA: Diagnosis not present

## 2019-12-04 DIAGNOSIS — E876 Hypokalemia: Secondary | ICD-10-CM | POA: Diagnosis not present

## 2019-12-04 DIAGNOSIS — Z992 Dependence on renal dialysis: Secondary | ICD-10-CM | POA: Diagnosis not present

## 2019-12-04 DIAGNOSIS — D631 Anemia in chronic kidney disease: Secondary | ICD-10-CM | POA: Diagnosis not present

## 2019-12-04 DIAGNOSIS — D509 Iron deficiency anemia, unspecified: Secondary | ICD-10-CM | POA: Diagnosis not present

## 2019-12-04 DIAGNOSIS — Z4932 Encounter for adequacy testing for peritoneal dialysis: Secondary | ICD-10-CM | POA: Diagnosis not present

## 2019-12-04 DIAGNOSIS — N186 End stage renal disease: Secondary | ICD-10-CM | POA: Diagnosis not present

## 2019-12-05 DIAGNOSIS — E876 Hypokalemia: Secondary | ICD-10-CM | POA: Diagnosis not present

## 2019-12-05 DIAGNOSIS — D631 Anemia in chronic kidney disease: Secondary | ICD-10-CM | POA: Diagnosis not present

## 2019-12-05 DIAGNOSIS — Z4932 Encounter for adequacy testing for peritoneal dialysis: Secondary | ICD-10-CM | POA: Diagnosis not present

## 2019-12-05 DIAGNOSIS — Z992 Dependence on renal dialysis: Secondary | ICD-10-CM | POA: Diagnosis not present

## 2019-12-05 DIAGNOSIS — D509 Iron deficiency anemia, unspecified: Secondary | ICD-10-CM | POA: Diagnosis not present

## 2019-12-05 DIAGNOSIS — N186 End stage renal disease: Secondary | ICD-10-CM | POA: Diagnosis not present

## 2019-12-06 DIAGNOSIS — E876 Hypokalemia: Secondary | ICD-10-CM | POA: Diagnosis not present

## 2019-12-06 DIAGNOSIS — D509 Iron deficiency anemia, unspecified: Secondary | ICD-10-CM | POA: Diagnosis not present

## 2019-12-06 DIAGNOSIS — N186 End stage renal disease: Secondary | ICD-10-CM | POA: Diagnosis not present

## 2019-12-06 DIAGNOSIS — Z992 Dependence on renal dialysis: Secondary | ICD-10-CM | POA: Diagnosis not present

## 2019-12-06 DIAGNOSIS — Z4932 Encounter for adequacy testing for peritoneal dialysis: Secondary | ICD-10-CM | POA: Diagnosis not present

## 2019-12-06 DIAGNOSIS — D631 Anemia in chronic kidney disease: Secondary | ICD-10-CM | POA: Diagnosis not present

## 2019-12-07 DIAGNOSIS — Z4932 Encounter for adequacy testing for peritoneal dialysis: Secondary | ICD-10-CM | POA: Diagnosis not present

## 2019-12-07 DIAGNOSIS — E876 Hypokalemia: Secondary | ICD-10-CM | POA: Diagnosis not present

## 2019-12-07 DIAGNOSIS — Z992 Dependence on renal dialysis: Secondary | ICD-10-CM | POA: Diagnosis not present

## 2019-12-07 DIAGNOSIS — D509 Iron deficiency anemia, unspecified: Secondary | ICD-10-CM | POA: Diagnosis not present

## 2019-12-07 DIAGNOSIS — D631 Anemia in chronic kidney disease: Secondary | ICD-10-CM | POA: Diagnosis not present

## 2019-12-07 DIAGNOSIS — N186 End stage renal disease: Secondary | ICD-10-CM | POA: Diagnosis not present

## 2019-12-08 DIAGNOSIS — E876 Hypokalemia: Secondary | ICD-10-CM | POA: Diagnosis not present

## 2019-12-08 DIAGNOSIS — N186 End stage renal disease: Secondary | ICD-10-CM | POA: Diagnosis not present

## 2019-12-08 DIAGNOSIS — D509 Iron deficiency anemia, unspecified: Secondary | ICD-10-CM | POA: Diagnosis not present

## 2019-12-08 DIAGNOSIS — Z4932 Encounter for adequacy testing for peritoneal dialysis: Secondary | ICD-10-CM | POA: Diagnosis not present

## 2019-12-08 DIAGNOSIS — D631 Anemia in chronic kidney disease: Secondary | ICD-10-CM | POA: Diagnosis not present

## 2019-12-08 DIAGNOSIS — Z992 Dependence on renal dialysis: Secondary | ICD-10-CM | POA: Diagnosis not present

## 2019-12-09 ENCOUNTER — Other Ambulatory Visit: Payer: Self-pay

## 2019-12-09 ENCOUNTER — Ambulatory Visit: Payer: Medicare Other | Admitting: Family Medicine

## 2019-12-09 ENCOUNTER — Encounter: Payer: Self-pay | Admitting: Family Medicine

## 2019-12-09 ENCOUNTER — Ambulatory Visit (INDEPENDENT_AMBULATORY_CARE_PROVIDER_SITE_OTHER): Payer: Medicare Other | Admitting: Family Medicine

## 2019-12-09 ENCOUNTER — Ambulatory Visit: Payer: Self-pay | Admitting: Family Medicine

## 2019-12-09 VITALS — BP 143/46 | HR 76 | Ht 67.0 in | Wt 245.0 lb

## 2019-12-09 DIAGNOSIS — E1122 Type 2 diabetes mellitus with diabetic chronic kidney disease: Secondary | ICD-10-CM

## 2019-12-09 DIAGNOSIS — N185 Chronic kidney disease, stage 5: Secondary | ICD-10-CM | POA: Diagnosis not present

## 2019-12-09 DIAGNOSIS — E11610 Type 2 diabetes mellitus with diabetic neuropathic arthropathy: Secondary | ICD-10-CM

## 2019-12-09 DIAGNOSIS — Z4932 Encounter for adequacy testing for peritoneal dialysis: Secondary | ICD-10-CM | POA: Diagnosis not present

## 2019-12-09 DIAGNOSIS — E8770 Fluid overload, unspecified: Secondary | ICD-10-CM

## 2019-12-09 DIAGNOSIS — E876 Hypokalemia: Secondary | ICD-10-CM | POA: Diagnosis not present

## 2019-12-09 DIAGNOSIS — D631 Anemia in chronic kidney disease: Secondary | ICD-10-CM | POA: Diagnosis not present

## 2019-12-09 DIAGNOSIS — D509 Iron deficiency anemia, unspecified: Secondary | ICD-10-CM | POA: Diagnosis not present

## 2019-12-09 DIAGNOSIS — I4891 Unspecified atrial fibrillation: Secondary | ICD-10-CM | POA: Diagnosis not present

## 2019-12-09 DIAGNOSIS — Z992 Dependence on renal dialysis: Secondary | ICD-10-CM | POA: Diagnosis not present

## 2019-12-09 DIAGNOSIS — N186 End stage renal disease: Secondary | ICD-10-CM | POA: Diagnosis not present

## 2019-12-09 LAB — POCT GLYCOSYLATED HEMOGLOBIN (HGB A1C): Hemoglobin A1C: 6.4 % — AB (ref 4.0–5.6)

## 2019-12-09 LAB — POCT INR: INR: 2.3 (ref 2.0–3.0)

## 2019-12-09 LAB — GLUCOSE, POCT (MANUAL RESULT ENTRY): POC Glucose: 61 mg/dl — AB (ref 70–99)

## 2019-12-09 MED ORDER — FUROSEMIDE 10 MG/ML IJ SOLN
40.0000 mg | Freq: Once | INTRAMUSCULAR | Status: AC
  Administered 2019-12-09: 12:00:00 80 mg via INTRAMUSCULAR

## 2019-12-09 NOTE — Patient Instructions (Signed)
Please decrease the Novolin N to 32 units in the morning.

## 2019-12-09 NOTE — Assessment & Plan Note (Signed)
See coumadin flow sheet

## 2019-12-09 NOTE — Assessment & Plan Note (Signed)
Will see if can get Tegaderm covered under insurance. Has f/U with Nephrology coming up

## 2019-12-09 NOTE — Progress Notes (Signed)
Established Patient Office Visit  Subjective:  Patient ID: Louis [name], male    DOB: [birth year]  Age: 74 y.o. MRN: [id number]  CC:  Chief Complaint  Patient presents with  . Diabetes    HPI Louis [name] presents for   Diabetes-we recently increased his Novolin and at bedtime to 45 units because his peritoneal dialysis does complain glucose and it was causing his morning blood sugars to be in the 200s and 300s.  Since going up on the Novolin in fasting sugars in the morning have been in the upper 100s but he has been having some low lunch and dinner blood sugars.  He is also using regular insulin for mealtime.  In the last week he is actually been retaining more fluid and swelling.  His weight is also up significantly.  His dry weight is right around 220 pounds and he is now weighing 234 pounds.  He is starting to feel more short of breath.  He did consult with neurology yesterday and they recommended increasing his furosemide to 160 mg twice a day and Zaroxolyn was added..  They have not gone to pick up the new prescription yet, though did increase his home furosemide dose last night and this morning.  Noticed maybe only a mild increase in urination..  Follow-up atrial fibrillation-overall doing well.  He is due for a Coumadin check.  He also wanted to know if he might be able to get Tegaderm covered through his insurance.  He uses it to cover up his port on his abdomen and keep the tubing in place. It is quite expensive out of pocket.    Past Medical History:  Diagnosis Date  . Brittle bones    per pt, has soft bones in right foot/wears boot cast!  . CAD (coronary artery disease)   . Cataract    Bil/ surg scheduled for right eye 01/18/17/ left eye 02/08/17  . Charcot ankle, right 2019  . CHF (congestive heart failure) ([place]) 2015  . Diabetes mellitus   . Heart failure, diastolic ([place])   . Hyperlipidemia   . Hypertension   . Macular edema 2014  . OSA on CPAP   . Personal  history of colonic polyps - adenomas 01/28/2014  . Renal insufficiency    being examined for dialysis for home dialysis [facility]  . Shortness of breath dyspnea   . Syncope and collapse     Past Surgical History:  Procedure Laterality Date  . CARPAL TUNNEL RELEASE     left hand  . COLONOSCOPY    . INTRAVASCULAR PRESSURE WIRE/FFR STUDY N/A 12/18/2018   Procedure: INTRAVASCULAR PRESSURE WIRE/FFR STUDY;  Surgeon: Wellington Hampshire, MD;  Location: [facility];  Service: Cardiovascular;  Laterality: N/A;  . IR FLUORO GUIDE CV LINE RIGHT  12/16/2018  . IR US GUIDE VASC ACCESS RIGHT  12/16/2018  . KNEE ARTHROSCOPY Right 09/13/2016   [facility], Dr. Dorna Leitz  . PILONIDAL CYST EXCISION    . RIGHT/LEFT HEART CATH AND CORONARY ANGIOGRAPHY N/A 12/18/2018   Procedure: RIGHT/LEFT HEART CATH AND CORONARY ANGIOGRAPHY;  Surgeon: Wellington Hampshire, MD;  Location: [facility];  Service: Cardiovascular;  Laterality: N/A;    Family History  Problem Relation Age of Onset  . Diabetes Mother   . Kidney disease Mother   . Diabetes Father   . Coronary artery disease Father   . Diabetes Brother   . Diabetes Sister   . Prostate cancer Maternal Uncle   .  Diabetes Maternal Grandmother   . Cancer Paternal Grandmother        unknown  . Heart attack Paternal Grandfather   . Colon cancer Neg Hx     Social History   Socioeconomic History  . Marital status: Married    Spouse name: [name]  . Number of children: 5  . Years of education: 92  . Highest education level: 12th grade  Occupational History  . Occupation: Warehouse    Comment: retired  Tobacco Use  . Smoking status: Former Smoker    Packs/day: 0.50    Years: 20.00    Pack years: 10.00    Types: Cigarettes    Quit date: 03/06/1977    Years since quitting: 42.7  . Smokeless tobacco: Never Used  Substance and Sexual Activity  . Alcohol use: No  . Drug use: No  . Sexual activity: Not Currently  Other Topics Concern   . Not on file  Social History Narrative   Drinks a cup of coffee daily. Drinks a lot of water daily. Goes out with church members during the week   Social Determinants of Health   Financial Resource Strain:   . Difficulty of Paying Living Expenses: Not on file  Food Insecurity:   . Worried About Charity fundraiser in the Last Year: Not on file  . Ran Out of Food in the Last Year: Not on file  Transportation Needs:   . Lack of Transportation (Medical): Not on file  . Lack of Transportation (Non-Medical): Not on file  Physical Activity:   . Days of Exercise per Week: Not on file  . Minutes of Exercise per Session: Not on file  Stress:   . Feeling of Stress : Not on file  Social Connections:   . Frequency of Communication with Friends and Family: Not on file  . Frequency of Social Gatherings with Friends and Family: Not on file  . Attends Religious Services: Not on file  . Active Member of Clubs or Organizations: Not on file  . Attends Archivist Meetings: Not on file  . Marital Status: Not on file  Intimate Partner Violence:   . Fear of Current or Ex-Partner: Not on file  . Emotionally Abused: Not on file  . Physically Abused: Not on file  . Sexually Abused: Not on file    Outpatient Medications Prior to Visit  Medication Sig Dispense Refill  . allopurinol (ZYLOPRIM) 100 MG tablet TAKE 1 & 1/2 (ONE & ONE-HALF) TABLETS BY MOUTH TWICE DAILY 270 tablet 0  . AMBULATORY NON FORMULARY MEDICATION Rollator walker with seat. Dx: M25.561, M54.12, M10.00 1 each 0  . AMBULATORY NON FORMULARY MEDICATION Continuous positive airway pressure (CPAP) device: Auto titrate minimum 4 cm H20 to maximum of 20 cm H2O with pressure. Please provide all supplemental supplies as needed. Fax to: [phone] 1 Units 1  . amLODipine (NORVASC) 10 MG tablet Take 1 tablet (10 mg total) by mouth daily. Pt is taking this in the morning 90 tablet 0  . amLODipine (NORVASC) 10 MG tablet Take 1 tablet (10  mg total) by mouth daily. Pt is taking this in the morning 30 tablet 6  . B-D INS SYR ULTRAFINE 1CC/31G 31G X 5/16" 1 ML MISC     . blood glucose meter kit and supplies Use to check blood sugars daily E11.65    . Cholecalciferol (VITAMIN D3) 5000 units CAPS Take 5,000 Units by mouth daily.    . Coenzyme Q10 (CO Q  10 PO) Take 1 tablet by mouth daily.    . furosemide (LASIX) 80 MG tablet Take 80 mg by mouth daily.    Marland Kitchen gabapentin (NEURONTIN) 300 MG capsule Take 1 capsule by mouth once daily 90 capsule 0  . heparin 1000 UNIT/ML injection heparin (porcine) 1,000 unit/mL    . hydrALAZINE (APRESOLINE) 100 MG tablet Take 1 tablet (100 mg total) by mouth 3 (three) times daily. 270 tablet 3  . isosorbide dinitrate (ISORDIL) 20 MG tablet Take 1 tablet (20 mg total) by mouth 2 (two) times daily. 180 tablet 3  . metolazone (ZAROXOLYN) 5 MG tablet Take 5 mg by mouth as needed.    . nitroGLYCERIN (NITROSTAT) 0.4 MG SL tablet Place 1 tablet (0.4 mg total) under the tongue every 5 (five) minutes as needed for chest pain. 25 tablet 0  . NOVOLIN N 100 UNIT/ML injection Inject 30 Units into the skin 2 (two) times daily before a meal.     . NOVOLIN R 100 UNIT/ML injection Inject 15-20 Units into the skin 3 (three) times daily with meals.   5  . ONE TOUCH ULTRA TEST test strip USE TO CHECK BLOOD SUGAR 4 TIMES A DAY DX E11.65 300 each 11  . Potassium Chloride ER 20 MEQ TBCR Take 20 mEq by mouth daily.    . rosuvastatin (CRESTOR) 40 MG tablet Take 1 tablet (40 mg total) by mouth daily. 90 tablet 3  . VITAMIN E PO Take 1 capsule by mouth daily.    Marland Kitchen warfarin (COUMADIN) 5 MG tablet TAKE 1 TABLET BY MOUTH ONCE DAILY AT  6  PM  MONITOR  INR  GOAL  SHOULD  BE  BETWEEN  2-3. 90 tablet 0  . zinc sulfate 220 (50 Zn) MG capsule Take 220 mg by mouth daily.     No facility-administered medications prior to visit.    Allergies  Allergen Reactions  . Hydrocodone Nausea And Vomiting    Other reaction(s): GI Upset  (intolerance) Projectile vomiting Projectile vomiting  . Oxycodone Nausea And Vomiting    Other reaction(s): GI Upset (intolerance), Vomiting (intolerance) Projectile vomiting Projectile vomiting  . Dacarbazine Other (See Comments)    Other reaction(s): Unknown  . Tape Dermatitis, Itching and Rash    Patch used at dialysis    ROS Review of Systems    Objective:    Physical Exam  Constitutional: He is oriented to person, place, and time. He appears well-developed and well-nourished.  HENT:  Head: Normocephalic and atraumatic.  Cardiovascular: Normal rate, regular rhythm and normal heart sounds.  Pulmonary/Chest: Effort normal and breath sounds normal.  No crackles  Neurological: He is alert and oriented to person, place, and time.  Skin: Skin is warm and dry.  Psychiatric: He has a normal mood and affect. His behavior is normal.    BP (!) 143/46   Pulse 76   Ht '5\' 7"'$  (1.702 m)   Wt 245 lb (111.1 kg)   SpO2 98%   BMI 38.37 kg/m  Wt Readings from Last 3 Encounters:  12/09/19 245 lb (111.1 kg)  11/10/19 223 lb (101.2 kg)  11/07/19 223 lb (101.2 kg)     Health Maintenance Due  Topic Date Due  . OPHTHALMOLOGY EXAM  12/06/2019    There are no preventive care reminders to display for this patient.  Lab Results  Component Value Date   TSH 2.391 02/02/2017   Lab Results  Component Value Date   WBC 7.0 10/27/2019   HGB  12.2 (L) 10/27/2019   HCT 38.7 (L) 10/27/2019   MCV 91.9 10/27/2019   PLT 190 10/27/2019   Lab Results  Component Value Date   NA 139 10/27/2019   K 3.0 (L) 10/27/2019   CO2 27 10/27/2019   GLUCOSE 79 10/27/2019   BUN 43 (H) 10/27/2019   CREATININE 3.66 (H) 10/27/2019   BILITOT 1.1 10/27/2019   ALKPHOS 105 10/27/2019   AST 31 10/27/2019   ALT 19 10/27/2019   PROT 6.7 10/27/2019   ALBUMIN 3.1 (L) 10/27/2019   CALCIUM 9.2 10/27/2019   ANIONGAP 13 10/27/2019   GFR 38.90 (L) 03/10/2011   Lab Results  Component Value Date   CHOL 103  08/19/2019   Lab Results  Component Value Date   HDL 37 (L) 08/19/2019   Lab Results  Component Value Date   LDLCALC 48 08/19/2019   Lab Results  Component Value Date   TRIG 97 08/19/2019   Lab Results  Component Value Date   CHOLHDL 2.8 08/19/2019   Lab Results  Component Value Date   HGBA1C 6.4 (A) 12/09/2019      Assessment & Plan:   Problem List Items Addressed This Visit      Cardiovascular and Mediastinum   Atrial fibrillation ([place])    See coumadin flow sheet.       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  Lunch and dinner consistently low since increasing evening N.Please decrease the Novolin N to 32 units in the morning.      Relevant Orders   POCT glycosylated hemoglobin (Hb A1C) (Completed)   POCT glucose (manual entry) (Completed)   CKD stage 5 due to type 2 diabetes mellitus ([place])    Will see if can get Tegaderm covered under insurance. Has f/U with Nephrology coming up       Other Visit Diagnoses    Hypervolemia, unspecified hypervolemia type       Relevant Medications   furosemide (LASIX) injection 40 mg (Completed)      Hypervolemia-starting to feel symptomatic with increased shortness of breath.  We will treat with IM Lasix to bypass the gut and see if this is helpful.  If he is not noticing some increase in urination this afternoon then recommend go for chest x-ray..  Wound care for peritoneal dialysis.  We will see if we can get the Tegaderm covered through his insurance as a DME supply.  Otherwise it will get quite expensive over the year.  His wife will send [place] a note back through my chart with the exact measurements and type.  Meds ordered this encounter  Medications  . furosemide (LASIX) injection 40 mg    Follow-up:  Return in about 4 weeks (around 01/06/2020).    [name], MD

## 2019-12-09 NOTE — Progress Notes (Signed)
b;oppocINR checked today. He is taking 5 mg on Wednesday/Sunday, and  2.5 mg all other days. No missed doses.  He has had some SOB due to fluid retention. He has recently had some changes to his medications due to this. His lasix 80 mg now taking 160 BID and metolazone 5 mg QDPRN.

## 2019-12-09 NOTE — Assessment & Plan Note (Signed)
Reviewed home log.  Lunch and dinner consistently low since increasing evening N.Please decrease the Novolin N to 32 units in the morning.

## 2019-12-10 ENCOUNTER — Other Ambulatory Visit: Payer: Self-pay | Admitting: *Deleted

## 2019-12-10 DIAGNOSIS — Z992 Dependence on renal dialysis: Secondary | ICD-10-CM

## 2019-12-10 DIAGNOSIS — N186 End stage renal disease: Secondary | ICD-10-CM | POA: Diagnosis not present

## 2019-12-10 DIAGNOSIS — D631 Anemia in chronic kidney disease: Secondary | ICD-10-CM | POA: Diagnosis not present

## 2019-12-10 DIAGNOSIS — Z4932 Encounter for adequacy testing for peritoneal dialysis: Secondary | ICD-10-CM | POA: Diagnosis not present

## 2019-12-10 DIAGNOSIS — D509 Iron deficiency anemia, unspecified: Secondary | ICD-10-CM | POA: Diagnosis not present

## 2019-12-10 DIAGNOSIS — E876 Hypokalemia: Secondary | ICD-10-CM | POA: Diagnosis not present

## 2019-12-10 MED ORDER — "TEGADERM FILM 4""X4-3/4"" MISC": 99 refills | Status: DC

## 2019-12-11 DIAGNOSIS — N186 End stage renal disease: Secondary | ICD-10-CM | POA: Diagnosis not present

## 2019-12-11 DIAGNOSIS — Z992 Dependence on renal dialysis: Secondary | ICD-10-CM | POA: Diagnosis not present

## 2019-12-11 DIAGNOSIS — Z4932 Encounter for adequacy testing for peritoneal dialysis: Secondary | ICD-10-CM | POA: Diagnosis not present

## 2019-12-11 DIAGNOSIS — D631 Anemia in chronic kidney disease: Secondary | ICD-10-CM | POA: Diagnosis not present

## 2019-12-11 DIAGNOSIS — D509 Iron deficiency anemia, unspecified: Secondary | ICD-10-CM | POA: Diagnosis not present

## 2019-12-11 DIAGNOSIS — E876 Hypokalemia: Secondary | ICD-10-CM | POA: Diagnosis not present

## 2019-12-12 DIAGNOSIS — N186 End stage renal disease: Secondary | ICD-10-CM | POA: Diagnosis not present

## 2019-12-12 DIAGNOSIS — Z4932 Encounter for adequacy testing for peritoneal dialysis: Secondary | ICD-10-CM | POA: Diagnosis not present

## 2019-12-12 DIAGNOSIS — D509 Iron deficiency anemia, unspecified: Secondary | ICD-10-CM | POA: Diagnosis not present

## 2019-12-12 DIAGNOSIS — Z992 Dependence on renal dialysis: Secondary | ICD-10-CM | POA: Diagnosis not present

## 2019-12-12 DIAGNOSIS — E876 Hypokalemia: Secondary | ICD-10-CM | POA: Diagnosis not present

## 2019-12-12 DIAGNOSIS — D631 Anemia in chronic kidney disease: Secondary | ICD-10-CM | POA: Diagnosis not present

## 2019-12-13 DIAGNOSIS — D631 Anemia in chronic kidney disease: Secondary | ICD-10-CM | POA: Diagnosis not present

## 2019-12-13 DIAGNOSIS — N186 End stage renal disease: Secondary | ICD-10-CM | POA: Diagnosis not present

## 2019-12-13 DIAGNOSIS — E876 Hypokalemia: Secondary | ICD-10-CM | POA: Diagnosis not present

## 2019-12-13 DIAGNOSIS — Z4932 Encounter for adequacy testing for peritoneal dialysis: Secondary | ICD-10-CM | POA: Diagnosis not present

## 2019-12-13 DIAGNOSIS — Z992 Dependence on renal dialysis: Secondary | ICD-10-CM | POA: Diagnosis not present

## 2019-12-13 DIAGNOSIS — D509 Iron deficiency anemia, unspecified: Secondary | ICD-10-CM | POA: Diagnosis not present

## 2019-12-14 DIAGNOSIS — Z992 Dependence on renal dialysis: Secondary | ICD-10-CM | POA: Diagnosis not present

## 2019-12-14 DIAGNOSIS — E876 Hypokalemia: Secondary | ICD-10-CM | POA: Diagnosis not present

## 2019-12-14 DIAGNOSIS — D631 Anemia in chronic kidney disease: Secondary | ICD-10-CM | POA: Diagnosis not present

## 2019-12-14 DIAGNOSIS — D509 Iron deficiency anemia, unspecified: Secondary | ICD-10-CM | POA: Diagnosis not present

## 2019-12-14 DIAGNOSIS — Z4932 Encounter for adequacy testing for peritoneal dialysis: Secondary | ICD-10-CM | POA: Diagnosis not present

## 2019-12-14 DIAGNOSIS — N186 End stage renal disease: Secondary | ICD-10-CM | POA: Diagnosis not present

## 2019-12-15 ENCOUNTER — Ambulatory Visit: Payer: Medicare Other

## 2019-12-15 DIAGNOSIS — E876 Hypokalemia: Secondary | ICD-10-CM | POA: Diagnosis not present

## 2019-12-15 DIAGNOSIS — Z4932 Encounter for adequacy testing for peritoneal dialysis: Secondary | ICD-10-CM | POA: Diagnosis not present

## 2019-12-15 DIAGNOSIS — Z992 Dependence on renal dialysis: Secondary | ICD-10-CM | POA: Diagnosis not present

## 2019-12-15 DIAGNOSIS — D509 Iron deficiency anemia, unspecified: Secondary | ICD-10-CM | POA: Diagnosis not present

## 2019-12-15 DIAGNOSIS — D631 Anemia in chronic kidney disease: Secondary | ICD-10-CM | POA: Diagnosis not present

## 2019-12-15 DIAGNOSIS — N186 End stage renal disease: Secondary | ICD-10-CM | POA: Diagnosis not present

## 2019-12-16 DIAGNOSIS — Z4932 Encounter for adequacy testing for peritoneal dialysis: Secondary | ICD-10-CM | POA: Diagnosis not present

## 2019-12-16 DIAGNOSIS — D509 Iron deficiency anemia, unspecified: Secondary | ICD-10-CM | POA: Diagnosis not present

## 2019-12-16 DIAGNOSIS — Z992 Dependence on renal dialysis: Secondary | ICD-10-CM | POA: Diagnosis not present

## 2019-12-16 DIAGNOSIS — D631 Anemia in chronic kidney disease: Secondary | ICD-10-CM | POA: Diagnosis not present

## 2019-12-16 DIAGNOSIS — E876 Hypokalemia: Secondary | ICD-10-CM | POA: Diagnosis not present

## 2019-12-16 DIAGNOSIS — N186 End stage renal disease: Secondary | ICD-10-CM | POA: Diagnosis not present

## 2019-12-17 DIAGNOSIS — D509 Iron deficiency anemia, unspecified: Secondary | ICD-10-CM | POA: Diagnosis not present

## 2019-12-17 DIAGNOSIS — N186 End stage renal disease: Secondary | ICD-10-CM | POA: Diagnosis not present

## 2019-12-17 DIAGNOSIS — Z992 Dependence on renal dialysis: Secondary | ICD-10-CM | POA: Diagnosis not present

## 2019-12-17 DIAGNOSIS — Z4932 Encounter for adequacy testing for peritoneal dialysis: Secondary | ICD-10-CM | POA: Diagnosis not present

## 2019-12-17 DIAGNOSIS — E876 Hypokalemia: Secondary | ICD-10-CM | POA: Diagnosis not present

## 2019-12-17 DIAGNOSIS — D631 Anemia in chronic kidney disease: Secondary | ICD-10-CM | POA: Diagnosis not present

## 2019-12-18 DIAGNOSIS — D631 Anemia in chronic kidney disease: Secondary | ICD-10-CM | POA: Diagnosis not present

## 2019-12-18 DIAGNOSIS — E876 Hypokalemia: Secondary | ICD-10-CM | POA: Diagnosis not present

## 2019-12-18 DIAGNOSIS — Z992 Dependence on renal dialysis: Secondary | ICD-10-CM | POA: Diagnosis not present

## 2019-12-18 DIAGNOSIS — Z4932 Encounter for adequacy testing for peritoneal dialysis: Secondary | ICD-10-CM | POA: Diagnosis not present

## 2019-12-18 DIAGNOSIS — N186 End stage renal disease: Secondary | ICD-10-CM | POA: Diagnosis not present

## 2019-12-18 DIAGNOSIS — D509 Iron deficiency anemia, unspecified: Secondary | ICD-10-CM | POA: Diagnosis not present

## 2019-12-19 DIAGNOSIS — D509 Iron deficiency anemia, unspecified: Secondary | ICD-10-CM | POA: Diagnosis not present

## 2019-12-19 DIAGNOSIS — Z992 Dependence on renal dialysis: Secondary | ICD-10-CM | POA: Diagnosis not present

## 2019-12-19 DIAGNOSIS — Z4932 Encounter for adequacy testing for peritoneal dialysis: Secondary | ICD-10-CM | POA: Diagnosis not present

## 2019-12-19 DIAGNOSIS — N186 End stage renal disease: Secondary | ICD-10-CM | POA: Diagnosis not present

## 2019-12-19 DIAGNOSIS — E876 Hypokalemia: Secondary | ICD-10-CM | POA: Diagnosis not present

## 2019-12-19 DIAGNOSIS — D631 Anemia in chronic kidney disease: Secondary | ICD-10-CM | POA: Diagnosis not present

## 2019-12-20 DIAGNOSIS — Z992 Dependence on renal dialysis: Secondary | ICD-10-CM | POA: Diagnosis not present

## 2019-12-20 DIAGNOSIS — D509 Iron deficiency anemia, unspecified: Secondary | ICD-10-CM | POA: Diagnosis not present

## 2019-12-20 DIAGNOSIS — Z4932 Encounter for adequacy testing for peritoneal dialysis: Secondary | ICD-10-CM | POA: Diagnosis not present

## 2019-12-20 DIAGNOSIS — D631 Anemia in chronic kidney disease: Secondary | ICD-10-CM | POA: Diagnosis not present

## 2019-12-20 DIAGNOSIS — N186 End stage renal disease: Secondary | ICD-10-CM | POA: Diagnosis not present

## 2019-12-20 DIAGNOSIS — E876 Hypokalemia: Secondary | ICD-10-CM | POA: Diagnosis not present

## 2019-12-21 DIAGNOSIS — D509 Iron deficiency anemia, unspecified: Secondary | ICD-10-CM | POA: Diagnosis not present

## 2019-12-21 DIAGNOSIS — N186 End stage renal disease: Secondary | ICD-10-CM | POA: Diagnosis not present

## 2019-12-21 DIAGNOSIS — D631 Anemia in chronic kidney disease: Secondary | ICD-10-CM | POA: Diagnosis not present

## 2019-12-21 DIAGNOSIS — Z992 Dependence on renal dialysis: Secondary | ICD-10-CM | POA: Diagnosis not present

## 2019-12-21 DIAGNOSIS — Z4932 Encounter for adequacy testing for peritoneal dialysis: Secondary | ICD-10-CM | POA: Diagnosis not present

## 2019-12-21 DIAGNOSIS — E876 Hypokalemia: Secondary | ICD-10-CM | POA: Diagnosis not present

## 2019-12-22 DIAGNOSIS — N186 End stage renal disease: Secondary | ICD-10-CM | POA: Diagnosis not present

## 2019-12-22 DIAGNOSIS — D631 Anemia in chronic kidney disease: Secondary | ICD-10-CM | POA: Diagnosis not present

## 2019-12-22 DIAGNOSIS — E875 Hyperkalemia: Secondary | ICD-10-CM | POA: Diagnosis not present

## 2019-12-22 DIAGNOSIS — Z992 Dependence on renal dialysis: Secondary | ICD-10-CM | POA: Diagnosis not present

## 2019-12-22 DIAGNOSIS — E1122 Type 2 diabetes mellitus with diabetic chronic kidney disease: Secondary | ICD-10-CM | POA: Diagnosis not present

## 2019-12-22 DIAGNOSIS — N2581 Secondary hyperparathyroidism of renal origin: Secondary | ICD-10-CM | POA: Diagnosis not present

## 2019-12-22 DIAGNOSIS — R82998 Other abnormal findings in urine: Secondary | ICD-10-CM | POA: Diagnosis not present

## 2019-12-22 DIAGNOSIS — R17 Unspecified jaundice: Secondary | ICD-10-CM | POA: Diagnosis not present

## 2019-12-22 DIAGNOSIS — Z79899 Other long term (current) drug therapy: Secondary | ICD-10-CM | POA: Diagnosis not present

## 2019-12-23 DIAGNOSIS — Z79899 Other long term (current) drug therapy: Secondary | ICD-10-CM | POA: Diagnosis not present

## 2019-12-23 DIAGNOSIS — Z992 Dependence on renal dialysis: Secondary | ICD-10-CM | POA: Diagnosis not present

## 2019-12-23 DIAGNOSIS — N186 End stage renal disease: Secondary | ICD-10-CM | POA: Diagnosis not present

## 2019-12-23 DIAGNOSIS — D631 Anemia in chronic kidney disease: Secondary | ICD-10-CM | POA: Diagnosis not present

## 2019-12-23 DIAGNOSIS — R17 Unspecified jaundice: Secondary | ICD-10-CM | POA: Diagnosis not present

## 2019-12-23 DIAGNOSIS — E875 Hyperkalemia: Secondary | ICD-10-CM | POA: Diagnosis not present

## 2019-12-24 DIAGNOSIS — D631 Anemia in chronic kidney disease: Secondary | ICD-10-CM | POA: Diagnosis not present

## 2019-12-24 DIAGNOSIS — Z992 Dependence on renal dialysis: Secondary | ICD-10-CM | POA: Diagnosis not present

## 2019-12-24 DIAGNOSIS — E875 Hyperkalemia: Secondary | ICD-10-CM | POA: Diagnosis not present

## 2019-12-24 DIAGNOSIS — Z79899 Other long term (current) drug therapy: Secondary | ICD-10-CM | POA: Diagnosis not present

## 2019-12-24 DIAGNOSIS — R17 Unspecified jaundice: Secondary | ICD-10-CM | POA: Diagnosis not present

## 2019-12-24 DIAGNOSIS — N186 End stage renal disease: Secondary | ICD-10-CM | POA: Diagnosis not present

## 2019-12-25 DIAGNOSIS — Z79899 Other long term (current) drug therapy: Secondary | ICD-10-CM | POA: Diagnosis not present

## 2019-12-25 DIAGNOSIS — Z992 Dependence on renal dialysis: Secondary | ICD-10-CM | POA: Diagnosis not present

## 2019-12-25 DIAGNOSIS — N186 End stage renal disease: Secondary | ICD-10-CM | POA: Diagnosis not present

## 2019-12-25 DIAGNOSIS — E875 Hyperkalemia: Secondary | ICD-10-CM | POA: Diagnosis not present

## 2019-12-25 DIAGNOSIS — R17 Unspecified jaundice: Secondary | ICD-10-CM | POA: Diagnosis not present

## 2019-12-25 DIAGNOSIS — D631 Anemia in chronic kidney disease: Secondary | ICD-10-CM | POA: Diagnosis not present

## 2019-12-26 DIAGNOSIS — Z992 Dependence on renal dialysis: Secondary | ICD-10-CM | POA: Diagnosis not present

## 2019-12-26 DIAGNOSIS — Z79899 Other long term (current) drug therapy: Secondary | ICD-10-CM | POA: Diagnosis not present

## 2019-12-26 DIAGNOSIS — E875 Hyperkalemia: Secondary | ICD-10-CM | POA: Diagnosis not present

## 2019-12-26 DIAGNOSIS — N186 End stage renal disease: Secondary | ICD-10-CM | POA: Diagnosis not present

## 2019-12-26 DIAGNOSIS — D631 Anemia in chronic kidney disease: Secondary | ICD-10-CM | POA: Diagnosis not present

## 2019-12-26 DIAGNOSIS — R17 Unspecified jaundice: Secondary | ICD-10-CM | POA: Diagnosis not present

## 2019-12-27 DIAGNOSIS — Z992 Dependence on renal dialysis: Secondary | ICD-10-CM | POA: Diagnosis not present

## 2019-12-27 DIAGNOSIS — N186 End stage renal disease: Secondary | ICD-10-CM | POA: Diagnosis not present

## 2019-12-27 DIAGNOSIS — D631 Anemia in chronic kidney disease: Secondary | ICD-10-CM | POA: Diagnosis not present

## 2019-12-27 DIAGNOSIS — Z79899 Other long term (current) drug therapy: Secondary | ICD-10-CM | POA: Diagnosis not present

## 2019-12-27 DIAGNOSIS — R17 Unspecified jaundice: Secondary | ICD-10-CM | POA: Diagnosis not present

## 2019-12-27 DIAGNOSIS — E875 Hyperkalemia: Secondary | ICD-10-CM | POA: Diagnosis not present

## 2019-12-28 DIAGNOSIS — Z79899 Other long term (current) drug therapy: Secondary | ICD-10-CM | POA: Diagnosis not present

## 2019-12-28 DIAGNOSIS — R17 Unspecified jaundice: Secondary | ICD-10-CM | POA: Diagnosis not present

## 2019-12-28 DIAGNOSIS — Z992 Dependence on renal dialysis: Secondary | ICD-10-CM | POA: Diagnosis not present

## 2019-12-28 DIAGNOSIS — D631 Anemia in chronic kidney disease: Secondary | ICD-10-CM | POA: Diagnosis not present

## 2019-12-28 DIAGNOSIS — N186 End stage renal disease: Secondary | ICD-10-CM | POA: Diagnosis not present

## 2019-12-28 DIAGNOSIS — E875 Hyperkalemia: Secondary | ICD-10-CM | POA: Diagnosis not present

## 2019-12-29 ENCOUNTER — Encounter: Payer: Self-pay | Admitting: Family Medicine

## 2019-12-29 DIAGNOSIS — R17 Unspecified jaundice: Secondary | ICD-10-CM | POA: Diagnosis not present

## 2019-12-29 DIAGNOSIS — Z992 Dependence on renal dialysis: Secondary | ICD-10-CM | POA: Diagnosis not present

## 2019-12-29 DIAGNOSIS — N186 End stage renal disease: Secondary | ICD-10-CM | POA: Diagnosis not present

## 2019-12-29 DIAGNOSIS — E875 Hyperkalemia: Secondary | ICD-10-CM | POA: Diagnosis not present

## 2019-12-29 DIAGNOSIS — D631 Anemia in chronic kidney disease: Secondary | ICD-10-CM | POA: Diagnosis not present

## 2019-12-29 DIAGNOSIS — Z79899 Other long term (current) drug therapy: Secondary | ICD-10-CM | POA: Diagnosis not present

## 2019-12-30 ENCOUNTER — Other Ambulatory Visit: Payer: Self-pay | Admitting: Family Medicine

## 2019-12-30 DIAGNOSIS — Z992 Dependence on renal dialysis: Secondary | ICD-10-CM | POA: Diagnosis not present

## 2019-12-30 DIAGNOSIS — Z79899 Other long term (current) drug therapy: Secondary | ICD-10-CM | POA: Diagnosis not present

## 2019-12-30 DIAGNOSIS — N186 End stage renal disease: Secondary | ICD-10-CM | POA: Diagnosis not present

## 2019-12-30 DIAGNOSIS — E875 Hyperkalemia: Secondary | ICD-10-CM | POA: Diagnosis not present

## 2019-12-30 DIAGNOSIS — D631 Anemia in chronic kidney disease: Secondary | ICD-10-CM | POA: Diagnosis not present

## 2019-12-30 DIAGNOSIS — R17 Unspecified jaundice: Secondary | ICD-10-CM | POA: Diagnosis not present

## 2019-12-30 NOTE — Telephone Encounter (Signed)
I have not seen this patient since 2017, I think Dr. Sheppard Coil is covering [facility] today, and I see you already routed to patient's PCP.

## 2019-12-31 DIAGNOSIS — R17 Unspecified jaundice: Secondary | ICD-10-CM | POA: Diagnosis not present

## 2019-12-31 DIAGNOSIS — D631 Anemia in chronic kidney disease: Secondary | ICD-10-CM | POA: Diagnosis not present

## 2019-12-31 DIAGNOSIS — N186 End stage renal disease: Secondary | ICD-10-CM | POA: Diagnosis not present

## 2019-12-31 DIAGNOSIS — Z992 Dependence on renal dialysis: Secondary | ICD-10-CM | POA: Diagnosis not present

## 2019-12-31 DIAGNOSIS — Z79899 Other long term (current) drug therapy: Secondary | ICD-10-CM | POA: Diagnosis not present

## 2019-12-31 DIAGNOSIS — E875 Hyperkalemia: Secondary | ICD-10-CM | POA: Diagnosis not present

## 2020-01-01 DIAGNOSIS — Z8616 Personal history of COVID-19: Secondary | ICD-10-CM | POA: Diagnosis not present

## 2020-01-01 DIAGNOSIS — R17 Unspecified jaundice: Secondary | ICD-10-CM | POA: Diagnosis not present

## 2020-01-01 DIAGNOSIS — D631 Anemia in chronic kidney disease: Secondary | ICD-10-CM | POA: Diagnosis not present

## 2020-01-01 DIAGNOSIS — Z992 Dependence on renal dialysis: Secondary | ICD-10-CM | POA: Diagnosis not present

## 2020-01-01 DIAGNOSIS — Z961 Presence of intraocular lens: Secondary | ICD-10-CM | POA: Diagnosis not present

## 2020-01-01 DIAGNOSIS — E113213 Type 2 diabetes mellitus with mild nonproliferative diabetic retinopathy with macular edema, bilateral: Secondary | ICD-10-CM | POA: Diagnosis not present

## 2020-01-01 DIAGNOSIS — Z79899 Other long term (current) drug therapy: Secondary | ICD-10-CM | POA: Diagnosis not present

## 2020-01-01 DIAGNOSIS — N186 End stage renal disease: Secondary | ICD-10-CM | POA: Diagnosis not present

## 2020-01-01 DIAGNOSIS — E875 Hyperkalemia: Secondary | ICD-10-CM | POA: Diagnosis not present

## 2020-01-02 DIAGNOSIS — Z79899 Other long term (current) drug therapy: Secondary | ICD-10-CM | POA: Diagnosis not present

## 2020-01-02 DIAGNOSIS — R17 Unspecified jaundice: Secondary | ICD-10-CM | POA: Diagnosis not present

## 2020-01-02 DIAGNOSIS — D631 Anemia in chronic kidney disease: Secondary | ICD-10-CM | POA: Diagnosis not present

## 2020-01-02 DIAGNOSIS — N186 End stage renal disease: Secondary | ICD-10-CM | POA: Diagnosis not present

## 2020-01-02 DIAGNOSIS — Z992 Dependence on renal dialysis: Secondary | ICD-10-CM | POA: Diagnosis not present

## 2020-01-02 DIAGNOSIS — E875 Hyperkalemia: Secondary | ICD-10-CM | POA: Diagnosis not present

## 2020-01-03 DIAGNOSIS — D631 Anemia in chronic kidney disease: Secondary | ICD-10-CM | POA: Diagnosis not present

## 2020-01-03 DIAGNOSIS — E875 Hyperkalemia: Secondary | ICD-10-CM | POA: Diagnosis not present

## 2020-01-03 DIAGNOSIS — Z79899 Other long term (current) drug therapy: Secondary | ICD-10-CM | POA: Diagnosis not present

## 2020-01-03 DIAGNOSIS — Z992 Dependence on renal dialysis: Secondary | ICD-10-CM | POA: Diagnosis not present

## 2020-01-03 DIAGNOSIS — N186 End stage renal disease: Secondary | ICD-10-CM | POA: Diagnosis not present

## 2020-01-03 DIAGNOSIS — R17 Unspecified jaundice: Secondary | ICD-10-CM | POA: Diagnosis not present

## 2020-01-04 DIAGNOSIS — N186 End stage renal disease: Secondary | ICD-10-CM | POA: Diagnosis not present

## 2020-01-04 DIAGNOSIS — E875 Hyperkalemia: Secondary | ICD-10-CM | POA: Diagnosis not present

## 2020-01-04 DIAGNOSIS — D631 Anemia in chronic kidney disease: Secondary | ICD-10-CM | POA: Diagnosis not present

## 2020-01-04 DIAGNOSIS — Z992 Dependence on renal dialysis: Secondary | ICD-10-CM | POA: Diagnosis not present

## 2020-01-04 DIAGNOSIS — Z79899 Other long term (current) drug therapy: Secondary | ICD-10-CM | POA: Diagnosis not present

## 2020-01-04 DIAGNOSIS — R17 Unspecified jaundice: Secondary | ICD-10-CM | POA: Diagnosis not present

## 2020-01-05 DIAGNOSIS — R17 Unspecified jaundice: Secondary | ICD-10-CM | POA: Diagnosis not present

## 2020-01-05 DIAGNOSIS — Z79899 Other long term (current) drug therapy: Secondary | ICD-10-CM | POA: Diagnosis not present

## 2020-01-05 DIAGNOSIS — D631 Anemia in chronic kidney disease: Secondary | ICD-10-CM | POA: Diagnosis not present

## 2020-01-05 DIAGNOSIS — N186 End stage renal disease: Secondary | ICD-10-CM | POA: Diagnosis not present

## 2020-01-05 DIAGNOSIS — E875 Hyperkalemia: Secondary | ICD-10-CM | POA: Diagnosis not present

## 2020-01-05 DIAGNOSIS — Z992 Dependence on renal dialysis: Secondary | ICD-10-CM | POA: Diagnosis not present

## 2020-01-05 MED ORDER — AMITRIPTYLINE HCL 25 MG PO TABS: 25.0000 mg | ORAL_TABLET | Freq: Every day | ORAL | 1 refills | Status: DC

## 2020-01-05 NOTE — Addendum Note (Signed)
Addended by: [name] D on: 01/05/2020 12:37 PM   Modules accepted: Orders

## 2020-01-06 ENCOUNTER — Encounter: Payer: Self-pay | Admitting: Family Medicine

## 2020-01-06 ENCOUNTER — Ambulatory Visit (INDEPENDENT_AMBULATORY_CARE_PROVIDER_SITE_OTHER): Payer: Medicare Other | Admitting: Family Medicine

## 2020-01-06 ENCOUNTER — Other Ambulatory Visit: Payer: Self-pay

## 2020-01-06 VITALS — BP 145/49 | HR 60 | Ht 67.0 in | Wt 245.0 lb

## 2020-01-06 DIAGNOSIS — E875 Hyperkalemia: Secondary | ICD-10-CM | POA: Diagnosis not present

## 2020-01-06 DIAGNOSIS — M79641 Pain in right hand: Secondary | ICD-10-CM | POA: Diagnosis not present

## 2020-01-06 DIAGNOSIS — I10 Essential (primary) hypertension: Secondary | ICD-10-CM

## 2020-01-06 DIAGNOSIS — E11610 Type 2 diabetes mellitus with diabetic neuropathic arthropathy: Secondary | ICD-10-CM

## 2020-01-06 DIAGNOSIS — Z992 Dependence on renal dialysis: Secondary | ICD-10-CM | POA: Diagnosis not present

## 2020-01-06 DIAGNOSIS — Z7901 Long term (current) use of anticoagulants: Secondary | ICD-10-CM

## 2020-01-06 DIAGNOSIS — R17 Unspecified jaundice: Secondary | ICD-10-CM | POA: Diagnosis not present

## 2020-01-06 DIAGNOSIS — D631 Anemia in chronic kidney disease: Secondary | ICD-10-CM | POA: Diagnosis not present

## 2020-01-06 DIAGNOSIS — M79642 Pain in left hand: Secondary | ICD-10-CM

## 2020-01-06 DIAGNOSIS — I4891 Unspecified atrial fibrillation: Secondary | ICD-10-CM | POA: Diagnosis not present

## 2020-01-06 DIAGNOSIS — N186 End stage renal disease: Secondary | ICD-10-CM | POA: Diagnosis not present

## 2020-01-06 DIAGNOSIS — Z79899 Other long term (current) drug therapy: Secondary | ICD-10-CM | POA: Diagnosis not present

## 2020-01-06 LAB — POCT INR: INR: 2.2 (ref 2.0–3.0)

## 2020-01-06 NOTE — Assessment & Plan Note (Signed)
Continue current dose of whole tab on Wed and Sun and half a tab all other days. Recheck 4 wks.

## 2020-01-06 NOTE — Progress Notes (Signed)
Pt here for INR check no missed doses, diet changes,bruising,bleeding,CP,SOB. He takes 5mg  on wed and Sunday and 2.5 mg all other days.Louis [name]

## 2020-01-06 NOTE — Assessment & Plan Note (Signed)
BP not well controlled today. Check at home this week and see if looks better.

## 2020-01-06 NOTE — Assessment & Plan Note (Signed)
Move NPH  To about 20 min before dialysis at night and increase to 55 units.  AM sugars still running very high in the upper 200s.  See scanned log.

## 2020-01-06 NOTE — Patient Instructions (Signed)
Send me the sugars in about one week.   Go up to 55 units of Novolin N and move to about 20 minutes before dialysis

## 2020-01-06 NOTE — Progress Notes (Signed)
Established Patient Office Visit  Subjective:  Patient ID: Louis [name], male    DOB: 1946/10/17  Age: 74 y.o. MRN: [id number]  CC:  Chief Complaint  Patient presents with  . Coagulation Disorder    HPI Louis [name] presents for   Elevated blood sugars-he has gone from 45 units gradually up to 50 units at bedtime for his Novolin in and his blood sugars are still running in the 200s, usually upper 200s consistently.  It really has not budged.  Part of it is that he is getting glucose and has overnight dialysis infusion.  He also wanted discussed pain control for his hands.  His wife had reached out about gabapentin but he is max dose for RF.  Sent over a new rx for amitriptyline yesterdays. Hasn't picked up today.  Has been using Tylenol 1 tab up to 4 times a day it does seem to help some but he does not want overtax his liver. The pain often wakes him up at night.    Past Medical History:  Diagnosis Date  . Brittle bones    per pt, has soft bones in right foot/wears boot cast!  . CAD (coronary artery disease)   . Cataract    Bil/ surg scheduled for right eye 01/18/17/ left eye 02/08/17  . Charcot ankle, right 2019  . CHF (congestive heart failure) ([place]) 2015  . Diabetes mellitus   . Heart failure, diastolic ([place])   . Hyperlipidemia   . Hypertension   . Macular edema 2014  . OSA on CPAP   . Personal history of colonic polyps - adenomas 01/28/2014  . Renal insufficiency    being examined for dialysis for home dialysis [facility]  . Shortness of breath dyspnea   . Syncope and collapse     Past Surgical History:  Procedure Laterality Date  . CARPAL TUNNEL RELEASE     left hand  . COLONOSCOPY    . INTRAVASCULAR PRESSURE WIRE/FFR STUDY N/A 12/18/2018   Procedure: INTRAVASCULAR PRESSURE WIRE/FFR STUDY;  Surgeon: Wellington Hampshire, MD;  Location: [facility];  Service: Cardiovascular;  Laterality: N/A;  . IR FLUORO GUIDE CV LINE RIGHT  12/16/2018  . IR US GUIDE VASC  ACCESS RIGHT  12/16/2018  . KNEE ARTHROSCOPY Right 09/13/2016   [facility], Dr. Dorna Leitz  . PILONIDAL CYST EXCISION    . RIGHT/LEFT HEART CATH AND CORONARY ANGIOGRAPHY N/A 12/18/2018   Procedure: RIGHT/LEFT HEART CATH AND CORONARY ANGIOGRAPHY;  Surgeon: Wellington Hampshire, MD;  Location: [facility];  Service: Cardiovascular;  Laterality: N/A;    Family History  Problem Relation Age of Onset  . Diabetes Mother   . Kidney disease Mother   . Diabetes Father   . Coronary artery disease Father   . Diabetes Brother   . Diabetes Sister   . Prostate cancer Maternal Uncle   . Diabetes Maternal Grandmother   . Cancer Paternal Grandmother        unknown  . Heart attack Paternal Grandfather   . Colon cancer Neg Hx     Social History   Socioeconomic History  . Marital status: Married    Spouse name: [name]  . Number of children: 5  . Years of education: 64  . Highest education level: 12th grade  Occupational History  . Occupation: Warehouse    Comment: retired  Tobacco Use  . Smoking status: Former Smoker    Packs/day: 0.50    Years: 20.00  Pack years: 10.00    Types: Cigarettes    Quit date: 03/06/1977    Years since quitting: 42.8  . Smokeless tobacco: Never Used  Substance and Sexual Activity  . Alcohol use: No  . Drug use: No  . Sexual activity: Not Currently  Other Topics Concern  . Not on file  Social History Narrative   Drinks a cup of coffee daily. Drinks a lot of water daily. Goes out with church members during the week   Social Determinants of Health   Financial Resource Strain:   . Difficulty of Paying Living Expenses: Not on file  Food Insecurity:   . Worried About Running Out of Food in the Last Year: Not on file  . Ran Out of Food in the Last Year: Not on file  Transportation Needs:   . Lack of Transportation (Medical): Not on file  . Lack of Transportation (Non-Medical): Not on file  Physical Activity:   . Days of Exercise per Week:  Not on file  . Minutes of Exercise per Session: Not on file  Stress:   . Feeling of Stress : Not on file  Social Connections:   . Frequency of Communication with Friends and Family: Not on file  . Frequency of Social Gatherings with Friends and Family: Not on file  . Attends Religious Services: Not on file  . Active Member of Clubs or Organizations: Not on file  . Attends Club or Organization Meetings: Not on file  . Marital Status: Not on file  Intimate Partner Violence:   . Fear of Current or Ex-Partner: Not on file  . Emotionally Abused: Not on file  . Physically Abused: Not on file  . Sexually Abused: Not on file    Outpatient Medications Prior to Visit  Medication Sig Dispense Refill  . allopurinol (ZYLOPRIM) 100 MG tablet TAKE 1 & 1/2 (ONE & ONE-HALF) TABLETS BY MOUTH TWICE DAILY 270 tablet 0  . AMBULATORY NON FORMULARY MEDICATION Rollator walker with seat. Dx: M25.561, M54.12, M10.00 1 each 0  . AMBULATORY NON FORMULARY MEDICATION Continuous positive airway pressure (CPAP) device: Auto titrate minimum 4 cm H20 to maximum of 20 cm H2O with pressure. Please provide all supplemental supplies as needed. Fax to: [phone] 1 Units 1  . amitriptyline (ELAVIL) 25 MG tablet Take 1-2 tablets (25-50 mg total) by mouth at bedtime. 60 tablet 1  . amLODipine (NORVASC) 10 MG tablet Take 1 tablet (10 mg total) by mouth daily. Pt is taking this in the morning 90 tablet 0  . B-D INS SYR ULTRAFINE 1CC/31G 31G X 5/16" 1 ML MISC     . blood glucose meter kit and supplies Use to check blood sugars daily E11.65    . Cholecalciferol (VITAMIN D3) 5000 units CAPS Take 5,000 Units by mouth daily.    . Coenzyme Q10 (CO Q 10 PO) Take 1 tablet by mouth daily.    . furosemide (LASIX) 80 MG tablet Take 80 mg by mouth daily.    . gabapentin (NEURONTIN) 300 MG capsule Take 1 capsule by mouth once daily 90 capsule 0  . heparin 1000 UNIT/ML injection heparin (porcine) 1,000 unit/mL    . hydrALAZINE  (APRESOLINE) 100 MG tablet Take 1 tablet (100 mg total) by mouth 3 (three) times daily. 270 tablet 3  . isosorbide dinitrate (ISORDIL) 20 MG tablet Take 1 tablet (20 mg total) by mouth 2 (two) times daily. 180 tablet 3  . isosorbide mononitrate (ISMO) 20 MG tablet Take 20 mg   by mouth 2 (two) times daily.    . metolazone (ZAROXOLYN) 5 MG tablet Take 5 mg by mouth as needed.    . nitroGLYCERIN (NITROSTAT) 0.4 MG SL tablet Place 1 tablet (0.4 mg total) under the tongue every 5 (five) minutes as needed for chest pain. 25 tablet 0  . NOVOLIN N 100 UNIT/ML injection Inject 30 Units into the skin 2 (two) times daily before a meal.     . NOVOLIN R 100 UNIT/ML injection Inject 15-20 Units into the skin 3 (three) times daily with meals.   5  . ONE TOUCH ULTRA TEST test strip USE TO CHECK BLOOD SUGAR 4 TIMES A DAY DX E11.65 300 each 11  . Potassium Chloride ER 20 MEQ TBCR Take 20 mEq by mouth daily.    . rosuvastatin (CRESTOR) 40 MG tablet Take 1 tablet (40 mg total) by mouth daily. 90 tablet 3  . Transparent Dressings (TEGADERM FIRST AID STYLE) MISC To be used with changing of peritoneal dialysis. Dx z99.2 [place] [phone] 50 each prn  . VITAMIN E PO Take 1 capsule by mouth daily.    Marland Kitchen warfarin (COUMADIN) 5 MG tablet TAKE 1 TABLET BY MOUTH ONCE DAILY AT  6  PM  MONITOR  INR  GOAL  SHOULD  BE  BETWEEN  2-3. 90 tablet 0  . zinc sulfate 220 (50 Zn) MG capsule Take 220 mg by mouth daily.     No facility-administered medications prior to visit.    Allergies  Allergen Reactions  . Hydrocodone Nausea And Vomiting    Other reaction(s): GI Upset (intolerance) Projectile vomiting Projectile vomiting  . Oxycodone Nausea And Vomiting    Other reaction(s): GI Upset (intolerance), Vomiting (intolerance) Projectile vomiting Projectile vomiting  . Dacarbazine Other (See Comments)    Other reaction(s): Unknown  . Tape Dermatitis, Itching and Rash    Patch used at dialysis    ROS Review of Systems     Objective:    Physical Exam  Constitutional: He is oriented to person, place, and time. He appears well-developed and well-nourished.  HENT:  Head: Normocephalic and atraumatic.  Cardiovascular: Normal rate, regular rhythm and normal heart sounds.  Pulmonary/Chest: Effort normal and breath sounds normal.  Neurological: He is alert and oriented to person, place, and time.  Skin: Skin is warm and dry.  Psychiatric: He has a normal mood and affect. His behavior is normal.    BP (!) 145/49   Pulse 60   Ht 5' 7" (1.702 m)   Wt 245 lb (111.1 kg)   SpO2 98%   BMI 38.37 kg/m  Wt Readings from Last 3 Encounters:  01/06/20 245 lb (111.1 kg)  12/09/19 245 lb (111.1 kg)  11/10/19 223 lb (101.2 kg)     Health Maintenance Due  Topic Date Due  . OPHTHALMOLOGY EXAM  12/06/2019    There are no preventive care reminders to display for this patient.  Lab Results  Component Value Date   TSH 2.391 02/02/2017   Lab Results  Component Value Date   WBC 7.0 10/27/2019   HGB 12.2 (L) 10/27/2019   HCT 38.7 (L) 10/27/2019   MCV 91.9 10/27/2019   PLT 190 10/27/2019   Lab Results  Component Value Date   NA 139 10/27/2019   K 3.0 (L) 10/27/2019   CO2 27 10/27/2019   GLUCOSE 79 10/27/2019   BUN 43 (H) 10/27/2019   CREATININE 3.66 (H) 10/27/2019   BILITOT 1.1 10/27/2019   ALKPHOS 105 10/27/2019  AST 31 10/27/2019   ALT 19 10/27/2019   PROT 6.7 10/27/2019   ALBUMIN 3.1 (L) 10/27/2019   CALCIUM 9.2 10/27/2019   ANIONGAP 13 10/27/2019   GFR 38.90 (L) 03/10/2011   Lab Results  Component Value Date   CHOL 103 08/19/2019   Lab Results  Component Value Date   HDL 37 (L) 08/19/2019   Lab Results  Component Value Date   LDLCALC 48 08/19/2019   Lab Results  Component Value Date   TRIG 97 08/19/2019   Lab Results  Component Value Date   CHOLHDL 2.8 08/19/2019   Lab Results  Component Value Date   HGBA1C 6.4 (A) 12/09/2019      Assessment & Plan:   Problem List Items  Addressed This Visit      Cardiovascular and Mediastinum   HYPERTENSION, BENIGN SYSTEMIC    BP not well controlled today. Check at home this week and see if looks better.        Relevant Medications   isosorbide mononitrate (ISMO) 20 MG tablet   Atrial fibrillation (HCC) - Primary    Continue current dose of whole tab on Wed and Sun and half a tab all other days. Recheck 4 wks.       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  AM sugars still running very high in the upper 200s.  See scanned log.         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 Will try adding Elavil at night since the pain is most bothersome at night. Consider Carpal tunnel with further workup.  F/U in 3 weeks.    Will try to get home health to help with home. His wife gail has injured her shoulder and cannot help him with putting on compression stockings, etc.    No orders of the defined types were placed in this encounter.   Follow-up: Return in about 4 weeks (around 02/03/2020) for Diabetes follow-up.   Time spent 40 min in encounter and coordinating care with home health.    , MD 

## 2020-01-07 DIAGNOSIS — N186 End stage renal disease: Secondary | ICD-10-CM | POA: Diagnosis not present

## 2020-01-07 DIAGNOSIS — R17 Unspecified jaundice: Secondary | ICD-10-CM | POA: Diagnosis not present

## 2020-01-07 DIAGNOSIS — E875 Hyperkalemia: Secondary | ICD-10-CM | POA: Diagnosis not present

## 2020-01-07 DIAGNOSIS — Z992 Dependence on renal dialysis: Secondary | ICD-10-CM | POA: Diagnosis not present

## 2020-01-07 DIAGNOSIS — Z79899 Other long term (current) drug therapy: Secondary | ICD-10-CM | POA: Diagnosis not present

## 2020-01-07 DIAGNOSIS — D631 Anemia in chronic kidney disease: Secondary | ICD-10-CM | POA: Diagnosis not present

## 2020-01-08 ENCOUNTER — Encounter: Payer: Self-pay | Admitting: Family Medicine

## 2020-01-08 DIAGNOSIS — R17 Unspecified jaundice: Secondary | ICD-10-CM | POA: Diagnosis not present

## 2020-01-08 DIAGNOSIS — Z992 Dependence on renal dialysis: Secondary | ICD-10-CM | POA: Diagnosis not present

## 2020-01-08 DIAGNOSIS — N186 End stage renal disease: Secondary | ICD-10-CM | POA: Diagnosis not present

## 2020-01-08 DIAGNOSIS — E875 Hyperkalemia: Secondary | ICD-10-CM | POA: Diagnosis not present

## 2020-01-08 DIAGNOSIS — D631 Anemia in chronic kidney disease: Secondary | ICD-10-CM | POA: Diagnosis not present

## 2020-01-08 DIAGNOSIS — Z79899 Other long term (current) drug therapy: Secondary | ICD-10-CM | POA: Diagnosis not present

## 2020-01-09 ENCOUNTER — Telehealth: Payer: Self-pay

## 2020-01-09 DIAGNOSIS — Z79899 Other long term (current) drug therapy: Secondary | ICD-10-CM | POA: Diagnosis not present

## 2020-01-09 DIAGNOSIS — R17 Unspecified jaundice: Secondary | ICD-10-CM | POA: Diagnosis not present

## 2020-01-09 DIAGNOSIS — N186 End stage renal disease: Secondary | ICD-10-CM | POA: Diagnosis not present

## 2020-01-09 DIAGNOSIS — D631 Anemia in chronic kidney disease: Secondary | ICD-10-CM | POA: Diagnosis not present

## 2020-01-09 DIAGNOSIS — Z992 Dependence on renal dialysis: Secondary | ICD-10-CM | POA: Diagnosis not present

## 2020-01-09 DIAGNOSIS — E875 Hyperkalemia: Secondary | ICD-10-CM | POA: Diagnosis not present

## 2020-01-09 NOTE — Telephone Encounter (Signed)
Can you call home health and see if they would just be able to do nursing care without physical therapy?  Thank you.

## 2020-01-09 NOTE — Telephone Encounter (Signed)
Marica with [facility] called and left a message. She stated she tried to initiate PT evaluation but the caregiver refused services.

## 2020-01-10 DIAGNOSIS — D631 Anemia in chronic kidney disease: Secondary | ICD-10-CM | POA: Diagnosis not present

## 2020-01-10 DIAGNOSIS — Z79899 Other long term (current) drug therapy: Secondary | ICD-10-CM | POA: Diagnosis not present

## 2020-01-10 DIAGNOSIS — R17 Unspecified jaundice: Secondary | ICD-10-CM | POA: Diagnosis not present

## 2020-01-10 DIAGNOSIS — E875 Hyperkalemia: Secondary | ICD-10-CM | POA: Diagnosis not present

## 2020-01-10 DIAGNOSIS — Z992 Dependence on renal dialysis: Secondary | ICD-10-CM | POA: Diagnosis not present

## 2020-01-10 DIAGNOSIS — N186 End stage renal disease: Secondary | ICD-10-CM | POA: Diagnosis not present

## 2020-01-11 DIAGNOSIS — R17 Unspecified jaundice: Secondary | ICD-10-CM | POA: Diagnosis not present

## 2020-01-11 DIAGNOSIS — Z992 Dependence on renal dialysis: Secondary | ICD-10-CM | POA: Diagnosis not present

## 2020-01-11 DIAGNOSIS — N186 End stage renal disease: Secondary | ICD-10-CM | POA: Diagnosis not present

## 2020-01-11 DIAGNOSIS — Z79899 Other long term (current) drug therapy: Secondary | ICD-10-CM | POA: Diagnosis not present

## 2020-01-11 DIAGNOSIS — E875 Hyperkalemia: Secondary | ICD-10-CM | POA: Diagnosis not present

## 2020-01-11 DIAGNOSIS — D631 Anemia in chronic kidney disease: Secondary | ICD-10-CM | POA: Diagnosis not present

## 2020-01-12 DIAGNOSIS — Z992 Dependence on renal dialysis: Secondary | ICD-10-CM | POA: Diagnosis not present

## 2020-01-12 DIAGNOSIS — Z79899 Other long term (current) drug therapy: Secondary | ICD-10-CM | POA: Diagnosis not present

## 2020-01-12 DIAGNOSIS — D631 Anemia in chronic kidney disease: Secondary | ICD-10-CM | POA: Diagnosis not present

## 2020-01-12 DIAGNOSIS — R17 Unspecified jaundice: Secondary | ICD-10-CM | POA: Diagnosis not present

## 2020-01-12 DIAGNOSIS — E875 Hyperkalemia: Secondary | ICD-10-CM | POA: Diagnosis not present

## 2020-01-12 DIAGNOSIS — N186 End stage renal disease: Secondary | ICD-10-CM | POA: Diagnosis not present

## 2020-01-13 DIAGNOSIS — Z79899 Other long term (current) drug therapy: Secondary | ICD-10-CM | POA: Diagnosis not present

## 2020-01-13 DIAGNOSIS — Z992 Dependence on renal dialysis: Secondary | ICD-10-CM | POA: Diagnosis not present

## 2020-01-13 DIAGNOSIS — E875 Hyperkalemia: Secondary | ICD-10-CM | POA: Diagnosis not present

## 2020-01-13 DIAGNOSIS — D631 Anemia in chronic kidney disease: Secondary | ICD-10-CM | POA: Diagnosis not present

## 2020-01-13 DIAGNOSIS — N186 End stage renal disease: Secondary | ICD-10-CM | POA: Diagnosis not present

## 2020-01-13 DIAGNOSIS — R17 Unspecified jaundice: Secondary | ICD-10-CM | POA: Diagnosis not present

## 2020-01-14 ENCOUNTER — Telehealth: Payer: Self-pay | Admitting: *Deleted

## 2020-01-14 DIAGNOSIS — Z992 Dependence on renal dialysis: Secondary | ICD-10-CM | POA: Diagnosis not present

## 2020-01-14 DIAGNOSIS — R17 Unspecified jaundice: Secondary | ICD-10-CM | POA: Diagnosis not present

## 2020-01-14 DIAGNOSIS — D631 Anemia in chronic kidney disease: Secondary | ICD-10-CM | POA: Diagnosis not present

## 2020-01-14 DIAGNOSIS — E875 Hyperkalemia: Secondary | ICD-10-CM | POA: Diagnosis not present

## 2020-01-14 DIAGNOSIS — N186 End stage renal disease: Secondary | ICD-10-CM | POA: Diagnosis not present

## 2020-01-14 DIAGNOSIS — Z79899 Other long term (current) drug therapy: Secondary | ICD-10-CM | POA: Diagnosis not present

## 2020-01-14 NOTE — Telephone Encounter (Signed)
Called and spoke w/Gail and gave her the information that I found out and told her that I sent this back to her in a my chart message so that she can reference if needed. Maryruth Eve, [name], CMA

## 2020-01-14 NOTE — Telephone Encounter (Signed)
Spoke w/Karen at [facility] and she said that they don't provide HHA. She advised me to contact [facility] [phone]. Their company offers PCA's that can help with the type of services that he/wife are seeking.    Called and spoke w/Jackie at [facility]. She informed me that they do offer PCA services however, they do NOT take medicare. She told me that it is hard to staff for shorter periods of time. The rate is $65-75 for 2 hours or less. >4 hours/ and starting with 2 days a week the rate is $24-25. The longer shifts are up to 12 hours.    No referral is needed for their services. The family will just need to call to check for availability that will meet their needs.Elouise Munroe, [place]

## 2020-01-15 DIAGNOSIS — N186 End stage renal disease: Secondary | ICD-10-CM | POA: Diagnosis not present

## 2020-01-15 DIAGNOSIS — E875 Hyperkalemia: Secondary | ICD-10-CM | POA: Diagnosis not present

## 2020-01-15 DIAGNOSIS — D631 Anemia in chronic kidney disease: Secondary | ICD-10-CM | POA: Diagnosis not present

## 2020-01-15 DIAGNOSIS — R17 Unspecified jaundice: Secondary | ICD-10-CM | POA: Diagnosis not present

## 2020-01-15 DIAGNOSIS — Z79899 Other long term (current) drug therapy: Secondary | ICD-10-CM | POA: Diagnosis not present

## 2020-01-15 DIAGNOSIS — Z992 Dependence on renal dialysis: Secondary | ICD-10-CM | POA: Diagnosis not present

## 2020-01-16 DIAGNOSIS — R17 Unspecified jaundice: Secondary | ICD-10-CM | POA: Diagnosis not present

## 2020-01-16 DIAGNOSIS — Z992 Dependence on renal dialysis: Secondary | ICD-10-CM | POA: Diagnosis not present

## 2020-01-16 DIAGNOSIS — D631 Anemia in chronic kidney disease: Secondary | ICD-10-CM | POA: Diagnosis not present

## 2020-01-16 DIAGNOSIS — N186 End stage renal disease: Secondary | ICD-10-CM | POA: Diagnosis not present

## 2020-01-16 DIAGNOSIS — Z79899 Other long term (current) drug therapy: Secondary | ICD-10-CM | POA: Diagnosis not present

## 2020-01-16 DIAGNOSIS — E875 Hyperkalemia: Secondary | ICD-10-CM | POA: Diagnosis not present

## 2020-01-17 DIAGNOSIS — N186 End stage renal disease: Secondary | ICD-10-CM | POA: Diagnosis not present

## 2020-01-17 DIAGNOSIS — E875 Hyperkalemia: Secondary | ICD-10-CM | POA: Diagnosis not present

## 2020-01-17 DIAGNOSIS — Z992 Dependence on renal dialysis: Secondary | ICD-10-CM | POA: Diagnosis not present

## 2020-01-17 DIAGNOSIS — D631 Anemia in chronic kidney disease: Secondary | ICD-10-CM | POA: Diagnosis not present

## 2020-01-17 DIAGNOSIS — Z79899 Other long term (current) drug therapy: Secondary | ICD-10-CM | POA: Diagnosis not present

## 2020-01-17 DIAGNOSIS — R17 Unspecified jaundice: Secondary | ICD-10-CM | POA: Diagnosis not present

## 2020-01-18 DIAGNOSIS — Z992 Dependence on renal dialysis: Secondary | ICD-10-CM | POA: Diagnosis not present

## 2020-01-18 DIAGNOSIS — E875 Hyperkalemia: Secondary | ICD-10-CM | POA: Diagnosis not present

## 2020-01-18 DIAGNOSIS — Z79899 Other long term (current) drug therapy: Secondary | ICD-10-CM | POA: Diagnosis not present

## 2020-01-18 DIAGNOSIS — R17 Unspecified jaundice: Secondary | ICD-10-CM | POA: Diagnosis not present

## 2020-01-18 DIAGNOSIS — D631 Anemia in chronic kidney disease: Secondary | ICD-10-CM | POA: Diagnosis not present

## 2020-01-18 DIAGNOSIS — N186 End stage renal disease: Secondary | ICD-10-CM | POA: Diagnosis not present

## 2020-01-19 DIAGNOSIS — E1122 Type 2 diabetes mellitus with diabetic chronic kidney disease: Secondary | ICD-10-CM | POA: Diagnosis not present

## 2020-01-19 DIAGNOSIS — Z79899 Other long term (current) drug therapy: Secondary | ICD-10-CM | POA: Diagnosis not present

## 2020-01-19 DIAGNOSIS — N2581 Secondary hyperparathyroidism of renal origin: Secondary | ICD-10-CM | POA: Diagnosis not present

## 2020-01-19 DIAGNOSIS — N186 End stage renal disease: Secondary | ICD-10-CM | POA: Diagnosis not present

## 2020-01-19 DIAGNOSIS — R17 Unspecified jaundice: Secondary | ICD-10-CM | POA: Diagnosis not present

## 2020-01-19 DIAGNOSIS — D631 Anemia in chronic kidney disease: Secondary | ICD-10-CM | POA: Diagnosis not present

## 2020-01-19 DIAGNOSIS — R82998 Other abnormal findings in urine: Secondary | ICD-10-CM | POA: Diagnosis not present

## 2020-01-19 DIAGNOSIS — Z992 Dependence on renal dialysis: Secondary | ICD-10-CM | POA: Diagnosis not present

## 2020-01-20 ENCOUNTER — Other Ambulatory Visit: Payer: Self-pay | Admitting: Family Medicine

## 2020-01-20 DIAGNOSIS — E1122 Type 2 diabetes mellitus with diabetic chronic kidney disease: Secondary | ICD-10-CM

## 2020-01-20 DIAGNOSIS — D631 Anemia in chronic kidney disease: Secondary | ICD-10-CM | POA: Diagnosis not present

## 2020-01-20 DIAGNOSIS — N186 End stage renal disease: Secondary | ICD-10-CM | POA: Diagnosis not present

## 2020-01-20 DIAGNOSIS — N2581 Secondary hyperparathyroidism of renal origin: Secondary | ICD-10-CM | POA: Diagnosis not present

## 2020-01-20 DIAGNOSIS — R17 Unspecified jaundice: Secondary | ICD-10-CM | POA: Diagnosis not present

## 2020-01-20 DIAGNOSIS — Z992 Dependence on renal dialysis: Secondary | ICD-10-CM | POA: Diagnosis not present

## 2020-01-20 DIAGNOSIS — Z79899 Other long term (current) drug therapy: Secondary | ICD-10-CM | POA: Diagnosis not present

## 2020-01-20 DIAGNOSIS — N184 Chronic kidney disease, stage 4 (severe): Secondary | ICD-10-CM

## 2020-01-21 ENCOUNTER — Telehealth: Payer: Self-pay

## 2020-01-21 DIAGNOSIS — N186 End stage renal disease: Secondary | ICD-10-CM | POA: Diagnosis not present

## 2020-01-21 DIAGNOSIS — R17 Unspecified jaundice: Secondary | ICD-10-CM | POA: Diagnosis not present

## 2020-01-21 DIAGNOSIS — Z79899 Other long term (current) drug therapy: Secondary | ICD-10-CM | POA: Diagnosis not present

## 2020-01-21 DIAGNOSIS — N2581 Secondary hyperparathyroidism of renal origin: Secondary | ICD-10-CM | POA: Diagnosis not present

## 2020-01-21 DIAGNOSIS — D631 Anemia in chronic kidney disease: Secondary | ICD-10-CM | POA: Diagnosis not present

## 2020-01-21 DIAGNOSIS — Z992 Dependence on renal dialysis: Secondary | ICD-10-CM | POA: Diagnosis not present

## 2020-01-21 NOTE — Telephone Encounter (Signed)
The pharmacy is faxing over the paperwork.

## 2020-01-21 NOTE — Telephone Encounter (Signed)
Louis [name]'s wife called about the test strips. She states he has to test 4 times daily. She also admits she sometimes checks his blood sugar more than 4 times a day. Medicare will only pay for 300 strips for a 90 day supply for patients on insulin. Medicare will require an appeal if greater than 3 times daily is needed. I am not sure where or how to file the appeal. I would need some time to research the steps. Please advise.   I did speak with Medicare DME [phone] but they did not have any appeal information. They did confirm Medicare will only cover 3 times daily.  [phone] Medicare Provider Line Opt-3

## 2020-01-21 NOTE — Telephone Encounter (Signed)
I would say lets just send an update the prescription and send to his pharmacy.  Not sure if he usually gets those locally or through mail order but just redo the prescription for 5 times a day.  It will initially be denied but they may give [place] the correct information for how to move forward with an authorization.

## 2020-01-22 DIAGNOSIS — D631 Anemia in chronic kidney disease: Secondary | ICD-10-CM | POA: Diagnosis not present

## 2020-01-22 DIAGNOSIS — R17 Unspecified jaundice: Secondary | ICD-10-CM | POA: Diagnosis not present

## 2020-01-22 DIAGNOSIS — Z79899 Other long term (current) drug therapy: Secondary | ICD-10-CM | POA: Diagnosis not present

## 2020-01-22 DIAGNOSIS — N186 End stage renal disease: Secondary | ICD-10-CM | POA: Diagnosis not present

## 2020-01-22 DIAGNOSIS — Z961 Presence of intraocular lens: Secondary | ICD-10-CM | POA: Diagnosis not present

## 2020-01-22 DIAGNOSIS — Z992 Dependence on renal dialysis: Secondary | ICD-10-CM | POA: Diagnosis not present

## 2020-01-22 DIAGNOSIS — E113213 Type 2 diabetes mellitus with mild nonproliferative diabetic retinopathy with macular edema, bilateral: Secondary | ICD-10-CM | POA: Diagnosis not present

## 2020-01-22 DIAGNOSIS — N2581 Secondary hyperparathyroidism of renal origin: Secondary | ICD-10-CM | POA: Diagnosis not present

## 2020-01-23 ENCOUNTER — Telehealth: Payer: Self-pay

## 2020-01-23 DIAGNOSIS — N186 End stage renal disease: Secondary | ICD-10-CM | POA: Diagnosis not present

## 2020-01-23 DIAGNOSIS — N2581 Secondary hyperparathyroidism of renal origin: Secondary | ICD-10-CM | POA: Diagnosis not present

## 2020-01-23 DIAGNOSIS — Z992 Dependence on renal dialysis: Secondary | ICD-10-CM | POA: Diagnosis not present

## 2020-01-23 DIAGNOSIS — R17 Unspecified jaundice: Secondary | ICD-10-CM | POA: Diagnosis not present

## 2020-01-23 DIAGNOSIS — Z79899 Other long term (current) drug therapy: Secondary | ICD-10-CM | POA: Diagnosis not present

## 2020-01-23 DIAGNOSIS — D631 Anemia in chronic kidney disease: Secondary | ICD-10-CM | POA: Diagnosis not present

## 2020-01-23 NOTE — Telephone Encounter (Signed)
Gail sent in glucose readings. (log sent to scan) Dr Madilyn Fireman recommends he increase the Novolin N insulin to 58 units at bedtime. [name] advised.

## 2020-01-24 DIAGNOSIS — N2581 Secondary hyperparathyroidism of renal origin: Secondary | ICD-10-CM | POA: Diagnosis not present

## 2020-01-24 DIAGNOSIS — Z992 Dependence on renal dialysis: Secondary | ICD-10-CM | POA: Diagnosis not present

## 2020-01-24 DIAGNOSIS — Z79899 Other long term (current) drug therapy: Secondary | ICD-10-CM | POA: Diagnosis not present

## 2020-01-24 DIAGNOSIS — N186 End stage renal disease: Secondary | ICD-10-CM | POA: Diagnosis not present

## 2020-01-24 DIAGNOSIS — D631 Anemia in chronic kidney disease: Secondary | ICD-10-CM | POA: Diagnosis not present

## 2020-01-24 DIAGNOSIS — R17 Unspecified jaundice: Secondary | ICD-10-CM | POA: Diagnosis not present

## 2020-01-25 DIAGNOSIS — N186 End stage renal disease: Secondary | ICD-10-CM | POA: Diagnosis not present

## 2020-01-25 DIAGNOSIS — Z992 Dependence on renal dialysis: Secondary | ICD-10-CM | POA: Diagnosis not present

## 2020-01-25 DIAGNOSIS — N2581 Secondary hyperparathyroidism of renal origin: Secondary | ICD-10-CM | POA: Diagnosis not present

## 2020-01-25 DIAGNOSIS — Z79899 Other long term (current) drug therapy: Secondary | ICD-10-CM | POA: Diagnosis not present

## 2020-01-25 DIAGNOSIS — R17 Unspecified jaundice: Secondary | ICD-10-CM | POA: Diagnosis not present

## 2020-01-25 DIAGNOSIS — D631 Anemia in chronic kidney disease: Secondary | ICD-10-CM | POA: Diagnosis not present

## 2020-01-26 ENCOUNTER — Telehealth: Payer: Medicare Other | Admitting: Family Medicine

## 2020-01-26 DIAGNOSIS — N186 End stage renal disease: Secondary | ICD-10-CM | POA: Diagnosis not present

## 2020-01-26 DIAGNOSIS — D631 Anemia in chronic kidney disease: Secondary | ICD-10-CM | POA: Diagnosis not present

## 2020-01-26 DIAGNOSIS — R17 Unspecified jaundice: Secondary | ICD-10-CM | POA: Diagnosis not present

## 2020-01-26 DIAGNOSIS — Z992 Dependence on renal dialysis: Secondary | ICD-10-CM | POA: Diagnosis not present

## 2020-01-26 DIAGNOSIS — N2581 Secondary hyperparathyroidism of renal origin: Secondary | ICD-10-CM | POA: Diagnosis not present

## 2020-01-26 DIAGNOSIS — Z79899 Other long term (current) drug therapy: Secondary | ICD-10-CM | POA: Diagnosis not present

## 2020-01-27 DIAGNOSIS — Z79899 Other long term (current) drug therapy: Secondary | ICD-10-CM | POA: Diagnosis not present

## 2020-01-27 DIAGNOSIS — N186 End stage renal disease: Secondary | ICD-10-CM | POA: Diagnosis not present

## 2020-01-27 DIAGNOSIS — Z992 Dependence on renal dialysis: Secondary | ICD-10-CM | POA: Diagnosis not present

## 2020-01-27 DIAGNOSIS — D631 Anemia in chronic kidney disease: Secondary | ICD-10-CM | POA: Diagnosis not present

## 2020-01-27 DIAGNOSIS — R17 Unspecified jaundice: Secondary | ICD-10-CM | POA: Diagnosis not present

## 2020-01-27 DIAGNOSIS — N2581 Secondary hyperparathyroidism of renal origin: Secondary | ICD-10-CM | POA: Diagnosis not present

## 2020-01-28 DIAGNOSIS — R17 Unspecified jaundice: Secondary | ICD-10-CM | POA: Diagnosis not present

## 2020-01-28 DIAGNOSIS — N2581 Secondary hyperparathyroidism of renal origin: Secondary | ICD-10-CM | POA: Diagnosis not present

## 2020-01-28 DIAGNOSIS — Z992 Dependence on renal dialysis: Secondary | ICD-10-CM | POA: Diagnosis not present

## 2020-01-28 DIAGNOSIS — D631 Anemia in chronic kidney disease: Secondary | ICD-10-CM | POA: Diagnosis not present

## 2020-01-28 DIAGNOSIS — Z79899 Other long term (current) drug therapy: Secondary | ICD-10-CM | POA: Diagnosis not present

## 2020-01-28 DIAGNOSIS — N186 End stage renal disease: Secondary | ICD-10-CM | POA: Diagnosis not present

## 2020-01-29 DIAGNOSIS — N2581 Secondary hyperparathyroidism of renal origin: Secondary | ICD-10-CM | POA: Diagnosis not present

## 2020-01-29 DIAGNOSIS — Z992 Dependence on renal dialysis: Secondary | ICD-10-CM | POA: Diagnosis not present

## 2020-01-29 DIAGNOSIS — D631 Anemia in chronic kidney disease: Secondary | ICD-10-CM | POA: Diagnosis not present

## 2020-01-29 DIAGNOSIS — Z79899 Other long term (current) drug therapy: Secondary | ICD-10-CM | POA: Diagnosis not present

## 2020-01-29 DIAGNOSIS — R17 Unspecified jaundice: Secondary | ICD-10-CM | POA: Diagnosis not present

## 2020-01-29 DIAGNOSIS — N186 End stage renal disease: Secondary | ICD-10-CM | POA: Diagnosis not present

## 2020-01-30 ENCOUNTER — Other Ambulatory Visit: Payer: Self-pay

## 2020-01-30 ENCOUNTER — Encounter: Payer: Self-pay | Admitting: Family Medicine

## 2020-01-30 ENCOUNTER — Ambulatory Visit (INDEPENDENT_AMBULATORY_CARE_PROVIDER_SITE_OTHER): Payer: Medicare Other | Admitting: Family Medicine

## 2020-01-30 DIAGNOSIS — D631 Anemia in chronic kidney disease: Secondary | ICD-10-CM | POA: Diagnosis not present

## 2020-01-30 DIAGNOSIS — Z992 Dependence on renal dialysis: Secondary | ICD-10-CM | POA: Diagnosis not present

## 2020-01-30 DIAGNOSIS — S0500XA Injury of conjunctiva and corneal abrasion without foreign body, unspecified eye, initial encounter: Secondary | ICD-10-CM | POA: Diagnosis not present

## 2020-01-30 DIAGNOSIS — N2581 Secondary hyperparathyroidism of renal origin: Secondary | ICD-10-CM | POA: Diagnosis not present

## 2020-01-30 DIAGNOSIS — Z79899 Other long term (current) drug therapy: Secondary | ICD-10-CM | POA: Diagnosis not present

## 2020-01-30 DIAGNOSIS — R17 Unspecified jaundice: Secondary | ICD-10-CM | POA: Diagnosis not present

## 2020-01-30 DIAGNOSIS — N186 End stage renal disease: Secondary | ICD-10-CM | POA: Diagnosis not present

## 2020-01-30 MED ORDER — ERYTHROMYCIN 5 MG/GM OP OINT: 1.0000 "application " | TOPICAL_OINTMENT | Freq: Four times a day (QID) | OPHTHALMIC | 0 refills | Status: DC

## 2020-01-30 NOTE — Assessment & Plan Note (Signed)
Symptoms and exam consistent with traumatic corneal abrasion.  No foreign bodies noted on exam.  No red flags or changes to visual acuity.  Discussed that these will often heal within a few days.   Will cover with erythromycin ointment as well.  He is not having significant pain at this time so I don't think patching or analgesics needed.   Discussed red flags including worsening pain, vision changes or purulent drainage from the eye. He does have a visit with his eye doctor next Thursday as well.

## 2020-01-30 NOTE — Progress Notes (Signed)
Louis [name] - 74 y.o. male MRN [id number]  Date of birth: [birth year]  Subjective Chief Complaint  Patient presents with  . Eye Pain    HPI Louis [name] is a 74 y.o. male here today for acute visit with complaint of L eye pain.  He reports that earlier today he was on his tractor working on his garden when a limb snapped back and hit him in the face.  He has had sensation of foreign body in the eye since that time.  He and his wife have tried saline drops and eye wash, which helped for a few minutes and then sensation returns.  He doesn't have a whole lot of pain with this.  He denies changes to his vision, headache, or dizziness.    ROS:  A comprehensive ROS was completed and negative except as noted per HPI  Allergies  Allergen Reactions  . Hydrocodone Nausea And Vomiting    Other reaction(s): GI Upset (intolerance) Projectile vomiting Projectile vomiting  . Oxycodone Nausea And Vomiting    Other reaction(s): GI Upset (intolerance), Vomiting (intolerance) Projectile vomiting Projectile vomiting  . Dacarbazine Other (See Comments)    Other reaction(s): Unknown  . Tape Dermatitis, Itching and Rash    Patch used at dialysis    Past Medical History:  Diagnosis Date  . Brittle bones    per pt, has soft bones in right foot/wears boot cast!  . CAD (coronary artery disease)   . Cataract    Bil/ surg scheduled for right eye 01/18/17/ left eye 02/08/17  . Charcot ankle, right 2019  . CHF (congestive heart failure) ([place]) 2015  . Diabetes mellitus   . Heart failure, diastolic ([place])   . Hyperlipidemia   . Hypertension   . Macular edema 2014  . OSA on CPAP   . Personal history of colonic polyps - adenomas 01/28/2014  . Renal insufficiency    being examined for dialysis for home dialysis [facility]  . Shortness of breath dyspnea   . Syncope and collapse     Past Surgical History:  Procedure Laterality Date  . CARPAL TUNNEL RELEASE     left hand  . COLONOSCOPY    .  INTRAVASCULAR PRESSURE WIRE/FFR STUDY N/A 12/18/2018   Procedure: INTRAVASCULAR PRESSURE WIRE/FFR STUDY;  Surgeon: Wellington Hampshire, MD;  Location: [facility];  Service: Cardiovascular;  Laterality: N/A;  . IR FLUORO GUIDE CV LINE RIGHT  12/16/2018  . IR US GUIDE VASC ACCESS RIGHT  12/16/2018  . KNEE ARTHROSCOPY Right 09/13/2016   [facility], Dr. Dorna Leitz  . PILONIDAL CYST EXCISION    . RIGHT/LEFT HEART CATH AND CORONARY ANGIOGRAPHY N/A 12/18/2018   Procedure: RIGHT/LEFT HEART CATH AND CORONARY ANGIOGRAPHY;  Surgeon: Wellington Hampshire, MD;  Location: [facility];  Service: Cardiovascular;  Laterality: N/A;    Social History   Socioeconomic History  . Marital status: Married    Spouse name: [name]  . Number of children: 5  . Years of education: 41  . Highest education level: 12th grade  Occupational History  . Occupation: Warehouse    Comment: retired  Tobacco Use  . Smoking status: Former Smoker    Packs/day: 0.50    Years: 20.00    Pack years: 10.00    Types: Cigarettes    Quit date: 03/06/1977    Years since quitting: 42.9  . Smokeless tobacco: Never Used  Substance and Sexual Activity  . Alcohol use: No  . Drug  use: No  . Sexual activity: Not Currently  Other Topics Concern  . Not on file  Social History Narrative   Drinks a cup of coffee daily. Drinks a lot of water daily. Goes out with church members during the week   Social Determinants of Health   Financial Resource Strain:   . Difficulty of Paying Living Expenses:   Food Insecurity:   . Worried About Charity fundraiser in the Last Year:   . Arboriculturist in the Last Year:   Transportation Needs:   . Film/video editor (Medical):   Marland Kitchen Lack of Transportation (Non-Medical):   Physical Activity:   . Days of Exercise per Week:   . Minutes of Exercise per Session:   Stress:   . Feeling of Stress :   Social Connections:   . Frequency of Communication with Friends and Family:   .  Frequency of Social Gatherings with Friends and Family:   . Attends Religious Services:   . Active Member of Clubs or Organizations:   . Attends Archivist Meetings:   Marland Kitchen Marital Status:     Family History  Problem Relation Age of Onset  . Diabetes Mother   . Kidney disease Mother   . Diabetes Father   . Coronary artery disease Father   . Diabetes Brother   . Diabetes Sister   . Prostate cancer Maternal Uncle   . Diabetes Maternal Grandmother   . Cancer Paternal Grandmother        unknown  . Heart attack Paternal Grandfather   . Colon cancer Neg Hx     Health Maintenance  Topic Date Due  . OPHTHALMOLOGY EXAM  12/06/2019  . COLONOSCOPY  01/26/2020  . INFLUENZA VACCINE  02/18/2020 (Originally 06/21/2019)  . HEMOGLOBIN A1C  06/07/2020  . TETANUS/TDAP  02/16/2026  . Hepatitis C Screening  Completed  . PNA vac Low Risk Adult  Completed     ----------------------------------------------------------------------------------------------------------------------------------------------------------------------------------------------------------------- Physical Exam BP (!) 144/75   Pulse (!) 108   Ht 5\' 7"  (1.702 m)   Wt 233 lb (105.7 kg)   BMI 36.49 kg/m   Physical Exam Constitutional:      Appearance: Normal appearance.  HENT:     Head: Atraumatic.  Eyes:     General: Lids are normal. Lids are everted, no foreign bodies appreciated. Vision grossly intact. No visual field deficit or scleral icterus.       Right eye: No foreign body or discharge.        Left eye: No foreign body or discharge.     Extraocular Movements: Extraocular movements intact.     Conjunctiva/sclera:     Right eye: Right conjunctiva is not injected. No chemosis, exudate or hemorrhage.    Left eye: Left conjunctiva is injected. No chemosis, exudate or hemorrhage.    Pupils: Pupils are equal, round, and reactive to light.     Comments: Visual acuity WNL He has relief of symptoms with  tetracaine drops.  Increased fluorescin uptake along middle of eye between iris and pupil (see image)   Neurological:     Mental Status: He is alert.      Media Information   Document Information     ------------------------------------------------------------------------------------------------------------------------------------------------------------------------------------------------------------------- Assessment and Plan  Corneal abrasion, initial encounter Symptoms and exam consistent with traumatic corneal abrasion.  No foreign bodies noted on exam.  No red flags or changes to visual acuity.  Discussed that these will often heal within a few days.  Will cover with erythromycin ointment as well.  He is not having significant pain at this time so I don't think patching or analgesics needed.   Discussed red flags including worsening pain, vision changes or purulent drainage from the eye. He does have a visit with his eye doctor next Thursday as well.     Meds ordered this encounter  Medications  . erythromycin ophthalmic ointment    Sig: Place 1 application into the left eye 4 (four) times daily.    Dispense:  3.5 g    Refill:  0    No follow-ups on file.    This visit occurred during the SARS-CoV-2 public health emergency.  Safety protocols were in place, including screening questions prior to the visit, additional usage of staff PPE, and extensive cleaning of exam room while observing appropriate contact time as indicated for disinfecting solutions.

## 2020-01-30 NOTE — Patient Instructions (Signed)
Corneal Abrasion  A corneal abrasion is a scratch or injury to the clear covering over the front of your eye (cornea). This can be painful. It is important to get treatment for a corneal abrasion. If this problem is not treated, it can affect your eyesight (vision). What are the causes?  A poke in the eye.  An object in the eye.  Too much eye rubbing.  Very dry eyes.  Certain eye infections.  Contact lenses that do not fit right or are worn for too long. You can also injure your cornea when putting contact lenses in your eye or taking them out.  Eye surgery.  Certain cornea problems may increase the chance of a corneal abrasion. Sometimes, the cause is not known. What are the signs or symptoms?  Eye pain. The pain may get worse when you open and close your eye or when you move your eye.  A feeling of something stuck in your eye.  Tearing, redness, and sensitivity to light.  Having trouble keeping your eye open, or not being able to keep it open.  Blurred vision.  Headache. How is this diagnosed? You may work with a health care provider who specializes in conditions of the eye (ophthalmologist). This condition may be diagnosed based on your medical history, symptoms, and an eye exam. How is this treated?  Washing out your eye.  Removing anything that is stuck in your eye.  Using antibiotic drops or ointment to treat or prevent an infection.  Using a dilating drop to decrease irritation, swelling, and pain.  Using steroid drops or ointment to treat redness, irritation, or swelling.  Applying a cold, wet cloth (cold compress) or ice pack to ease the pain  Taking pain medicine by mouth. In some cases, an eye patch or bandage soft contact lens might also be used. An eye patch should not be used if the corneal abrasion was related to contact lens wear as it can increase the chance of infection in these eyes. Follow these instructions at home: Medicines  Use eye drops  or ointments as told by your doctor.  If you were prescribed antibiotic drops or ointment, use them as told by your doctor. Do not stop using the antibiotic even if you start to feel better.  Take over-the-counter and prescription medicines only as told by your doctor.  Ask your doctor if the medicine prescribed to you: ? Requires you to avoid driving or using heavy machinery. ? Can cause trouble pooping (constipation). You may need to take these actions to prevent or treat trouble pooping:  Drink enough fluid to keep your pee (urine) pale yellow.  Take over-the-counter or prescription medicines.  Eat foods that are high in fiber. These include beans, whole grains, and fresh fruits and vegetables.  Limit foods that are high in fat and processed sugars. These include fried or sweet foods. Using an eye patch If you have an eye patch, wear it as told by your doctor.  Do not drive or use machinery while wearing an eye patch.  Follow instructions from your doctor about when to take off the patch. General instructions  Ask your doctor if you can use a cold, wet cloth on your eye to help with pain.  Do not rub or touch your eye. Do not wash out your eye.  Do not wear contact lenses until your doctor says that this is okay.  Avoid bright light.  Avoid straining your eyes.  Keep all follow-up visits as   told by your doctor. Doing this can help to prevent infection and loss of eyesight. Contact a doctor if:  You keep having eye pain and other symptoms for more than 2 days.  You get new symptoms, such as more redness, watery eyes, or discharge.  You have discharge that makes your eyelids stick together in the morning.  Your eye patch becomes so loose that you can blink your eye.  Symptoms come back after your eye heals. Get help right away if:  You have very bad eye pain that does not get better with medicine.  You lose eyesight. Summary  A corneal abrasion is a scratch or  injury to the clear covering over the front of the eye (cornea).  It is important to get treatment for a corneal abrasion. If this problem is not treated, it can affect your eyesight (vision).  Use eye drops or ointments as told by your doctor.  If you have an eye patch, do not drive or use machinery while wearing it.  Let your doctor know if your symptoms last for more than 2 days. This information is not intended to replace advice given to you by your health care provider. Make sure you discuss any questions you have with your health care provider. Document Revised: 03/14/2019 Document Reviewed: 03/14/2019 Elsevier Patient Education  2020 Elsevier Inc.  

## 2020-01-31 DIAGNOSIS — R17 Unspecified jaundice: Secondary | ICD-10-CM | POA: Diagnosis not present

## 2020-01-31 DIAGNOSIS — N186 End stage renal disease: Secondary | ICD-10-CM | POA: Diagnosis not present

## 2020-01-31 DIAGNOSIS — D631 Anemia in chronic kidney disease: Secondary | ICD-10-CM | POA: Diagnosis not present

## 2020-01-31 DIAGNOSIS — N2581 Secondary hyperparathyroidism of renal origin: Secondary | ICD-10-CM | POA: Diagnosis not present

## 2020-01-31 DIAGNOSIS — Z992 Dependence on renal dialysis: Secondary | ICD-10-CM | POA: Diagnosis not present

## 2020-01-31 DIAGNOSIS — Z79899 Other long term (current) drug therapy: Secondary | ICD-10-CM | POA: Diagnosis not present

## 2020-02-01 DIAGNOSIS — Z992 Dependence on renal dialysis: Secondary | ICD-10-CM | POA: Diagnosis not present

## 2020-02-01 DIAGNOSIS — N2581 Secondary hyperparathyroidism of renal origin: Secondary | ICD-10-CM | POA: Diagnosis not present

## 2020-02-01 DIAGNOSIS — D631 Anemia in chronic kidney disease: Secondary | ICD-10-CM | POA: Diagnosis not present

## 2020-02-01 DIAGNOSIS — N186 End stage renal disease: Secondary | ICD-10-CM | POA: Diagnosis not present

## 2020-02-01 DIAGNOSIS — R17 Unspecified jaundice: Secondary | ICD-10-CM | POA: Diagnosis not present

## 2020-02-01 DIAGNOSIS — Z79899 Other long term (current) drug therapy: Secondary | ICD-10-CM | POA: Diagnosis not present

## 2020-02-02 DIAGNOSIS — N186 End stage renal disease: Secondary | ICD-10-CM | POA: Diagnosis not present

## 2020-02-02 DIAGNOSIS — Z79899 Other long term (current) drug therapy: Secondary | ICD-10-CM | POA: Diagnosis not present

## 2020-02-02 DIAGNOSIS — Z992 Dependence on renal dialysis: Secondary | ICD-10-CM | POA: Diagnosis not present

## 2020-02-02 DIAGNOSIS — R17 Unspecified jaundice: Secondary | ICD-10-CM | POA: Diagnosis not present

## 2020-02-02 DIAGNOSIS — D631 Anemia in chronic kidney disease: Secondary | ICD-10-CM | POA: Diagnosis not present

## 2020-02-02 DIAGNOSIS — N2581 Secondary hyperparathyroidism of renal origin: Secondary | ICD-10-CM | POA: Diagnosis not present

## 2020-02-03 ENCOUNTER — Ambulatory Visit (INDEPENDENT_AMBULATORY_CARE_PROVIDER_SITE_OTHER): Payer: Medicare Other | Admitting: Sports Medicine

## 2020-02-03 ENCOUNTER — Ambulatory Visit (INDEPENDENT_AMBULATORY_CARE_PROVIDER_SITE_OTHER): Payer: Medicare Other | Admitting: Family Medicine

## 2020-02-03 ENCOUNTER — Other Ambulatory Visit: Payer: Self-pay

## 2020-02-03 ENCOUNTER — Encounter: Payer: Self-pay | Admitting: Family Medicine

## 2020-02-03 VITALS — BP 133/51 | HR 64 | Ht 67.0 in | Wt 233.0 lb

## 2020-02-03 DIAGNOSIS — M720 Palmar fascial fibromatosis [Dupuytren]: Secondary | ICD-10-CM

## 2020-02-03 DIAGNOSIS — M79642 Pain in left hand: Secondary | ICD-10-CM | POA: Diagnosis not present

## 2020-02-03 DIAGNOSIS — N186 End stage renal disease: Secondary | ICD-10-CM | POA: Diagnosis not present

## 2020-02-03 DIAGNOSIS — M79641 Pain in right hand: Secondary | ICD-10-CM

## 2020-02-03 DIAGNOSIS — R17 Unspecified jaundice: Secondary | ICD-10-CM | POA: Diagnosis not present

## 2020-02-03 DIAGNOSIS — E11610 Type 2 diabetes mellitus with diabetic neuropathic arthropathy: Secondary | ICD-10-CM | POA: Diagnosis not present

## 2020-02-03 DIAGNOSIS — D631 Anemia in chronic kidney disease: Secondary | ICD-10-CM | POA: Diagnosis not present

## 2020-02-03 DIAGNOSIS — M65331 Trigger finger, right middle finger: Secondary | ICD-10-CM | POA: Diagnosis not present

## 2020-02-03 DIAGNOSIS — I10 Essential (primary) hypertension: Secondary | ICD-10-CM | POA: Diagnosis not present

## 2020-02-03 DIAGNOSIS — I4891 Unspecified atrial fibrillation: Secondary | ICD-10-CM | POA: Diagnosis not present

## 2020-02-03 DIAGNOSIS — Z79899 Other long term (current) drug therapy: Secondary | ICD-10-CM | POA: Diagnosis not present

## 2020-02-03 DIAGNOSIS — Z992 Dependence on renal dialysis: Secondary | ICD-10-CM | POA: Diagnosis not present

## 2020-02-03 DIAGNOSIS — N2581 Secondary hyperparathyroidism of renal origin: Secondary | ICD-10-CM | POA: Diagnosis not present

## 2020-02-03 LAB — POCT GLYCOSYLATED HEMOGLOBIN (HGB A1C): Hemoglobin A1C: 6.7 % — AB (ref 4.0–5.6)

## 2020-02-03 LAB — POCT INR: INR: 2.3 (ref 2.0–3.0)

## 2020-02-03 NOTE — Assessment & Plan Note (Signed)
Pressure looks okay today.  Continue to monitor.

## 2020-02-03 NOTE — Patient Instructions (Signed)
Sliding Scale as below.    Glucose level:  Under 140 - no extra 141-180 - give 2 units + 10 for meal 181-120- give 4 units + 10 for meal 221-260 - give 6 units + 10 for meal 261-300 - given 8 units + 10 for meal 301-350 - give 10 units + 10 for meal.   Ok to use less for meal if you are not feeling well and don't eat very much.

## 2020-02-03 NOTE — Assessment & Plan Note (Signed)
INR at goal. No change today.  The anticoagulation flowsheet.

## 2020-02-03 NOTE — Assessment & Plan Note (Addendum)
Continue current dose of Novolin in and will switch to sliding scale for meals to see if this is more helpful for the Novolin R.  Like to try this for a week and see how his blood sugars are doing and then we can always work on adjusting the Novolin in bedtime dose as long as he is not having any more hypoglycemic events around lunchtime. Checking glucose 4-5 times per day bc of his dialysis.  Will try to apply for extra strips.  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

## 2020-02-03 NOTE — Progress Notes (Addendum)
Established Patient Office Visit  Subjective:  Patient ID: Louis [name], male    DOB: 1946-10-13  Age: 74 y.o. MRN: [id number]  CC:  Chief Complaint  Patient presents with  . Diabetes  . Coagulation Disorder    HPI Louis [name] presents for diabetes.  Ever since he switched to peritoneal dialysis he has had significant difficulty in controlling his blood sugars since there is a large amount of glucose in his infusion.  Been making tweaks every 2 to 3 weeks.  Last adjustment was about 2 weeks ago.  We are increased his Novolin in to 58 units at bedtime.  His wife says she struggling a little bit with figuring out how much of the short acting insulin to give with meals.  Right now she is just estimating and giving anywhere between 15 to 20 units with each meal unless the blood sugar is low.  He occasionally has had a couple of hypoglycemic events right before lunchtime.  Please see scanned in home record. He is checking his sugars before each meal and at bedtime and occ before snack. So checking  4-5 times per day.   He was also just seen about 4 days ago for a corneal abrasion by 1 my partners.  He j is almost finished with the ointment and says his eye actually feels much better.  He has a follow-up with his eye doctor next week to just recheck the eye.  He also wanted me to look at his right hand today.  He is getting some triggering of his middle finger and is also having some difficulty straightening some of his fingers.  He says its not causing a lot of pain or discomfort.  Past Medical History:  Diagnosis Date  . Brittle bones    per pt, has soft bones in right foot/wears boot cast!  . CAD (coronary artery disease)   . Cataract    Bil/ surg scheduled for right eye 01/18/17/ left eye 02/08/17  . Charcot ankle, right 2019  . CHF (congestive heart failure) ([place]) 2015  . Diabetes mellitus   . Heart failure, diastolic ([place])   . Hyperlipidemia   . Hypertension   . Macular edema  2014  . OSA on CPAP   . Personal history of colonic polyps - adenomas 01/28/2014  . Renal insufficiency    being examined for dialysis for home dialysis [facility]  . Shortness of breath dyspnea   . Syncope and collapse     Past Surgical History:  Procedure Laterality Date  . CARPAL TUNNEL RELEASE     left hand  . COLONOSCOPY    . INTRAVASCULAR PRESSURE WIRE/FFR STUDY N/A 12/18/2018   Procedure: INTRAVASCULAR PRESSURE WIRE/FFR STUDY;  Surgeon: Wellington Hampshire, MD;  Location: [facility];  Service: Cardiovascular;  Laterality: N/A;  . IR FLUORO GUIDE CV LINE RIGHT  12/16/2018  . IR US GUIDE VASC ACCESS RIGHT  12/16/2018  . KNEE ARTHROSCOPY Right 09/13/2016   [facility], Dr. Dorna Leitz  . PILONIDAL CYST EXCISION    . RIGHT/LEFT HEART CATH AND CORONARY ANGIOGRAPHY N/A 12/18/2018   Procedure: RIGHT/LEFT HEART CATH AND CORONARY ANGIOGRAPHY;  Surgeon: Wellington Hampshire, MD;  Location: [facility];  Service: Cardiovascular;  Laterality: N/A;    Family History  Problem Relation Age of Onset  . Diabetes Mother   . Kidney disease Mother   . Diabetes Father   . Coronary artery disease Father   . Diabetes Brother   .  Diabetes Sister   . Prostate cancer Maternal Uncle   . Diabetes Maternal Grandmother   . Cancer Paternal Grandmother        unknown  . Heart attack Paternal Grandfather   . Colon cancer Neg Hx     Social History   Socioeconomic History  . Marital status: Married    Spouse name: Louis [name]  . Number of children: 5  . Years of education: 84  . Highest education level: 12th grade  Occupational History  . Occupation: Warehouse    Comment: retired  Tobacco Use  . Smoking status: Former Smoker    Packs/day: 0.50    Years: 20.00    Pack years: 10.00    Types: Cigarettes    Quit date: 03/06/1977    Years since quitting: 42.9  . Smokeless tobacco: Never Used  Substance and Sexual Activity  . Alcohol use: No  . Drug use: No  . Sexual activity: Not  Currently  Other Topics Concern  . Not on file  Social History Narrative   Drinks a cup of coffee daily. Drinks a lot of water daily. Goes out with church members during the week   Social Determinants of Health   Financial Resource Strain:   . Difficulty of Paying Living Expenses:   Food Insecurity:   . Worried About Charity fundraiser in the Last Year:   . Arboriculturist in the Last Year:   Transportation Needs:   . Film/video editor (Medical):   Marland Kitchen Lack of Transportation (Non-Medical):   Physical Activity:   . Days of Exercise per Week:   . Minutes of Exercise per Session:   Stress:   . Feeling of Stress :   Social Connections:   . Frequency of Communication with Friends and Family:   . Frequency of Social Gatherings with Friends and Family:   . Attends Religious Services:   . Active Member of Clubs or Organizations:   . Attends Archivist Meetings:   Marland Kitchen Marital Status:   Intimate Partner Violence:   . Fear of Current or Ex-Partner:   . Emotionally Abused:   Marland Kitchen Physically Abused:   . Sexually Abused:     Outpatient Medications Prior to Visit  Medication Sig Dispense Refill  . allopurinol (ZYLOPRIM) 100 MG tablet TAKE 1 & 1/2 (ONE & ONE-HALF) TABLETS BY MOUTH TWICE DAILY 270 tablet 0  . AMBULATORY NON FORMULARY MEDICATION Rollator walker with seat. Dx: M25.561, M54.12, M10.00 1 each 0  . AMBULATORY NON FORMULARY MEDICATION Continuous positive airway pressure (CPAP) device: Auto titrate minimum 4 cm H20 to maximum of 20 cm H2O with pressure. Please provide all supplemental supplies as needed. Fax to: [phone] 1 Units 1  . amitriptyline (ELAVIL) 25 MG tablet Take 1-2 tablets (25-50 mg total) by mouth at bedtime. (Patient taking differently: Take 25-50 mg by mouth at bedtime. Taking 12.5 mg at bedtime) 60 tablet 1  . amLODipine (NORVASC) 10 MG tablet Take 1 tablet (10 mg total) by mouth daily. Pt is taking this in the morning 90 tablet 0  . B-D INS SYR  ULTRAFINE 1CC/31G 31G X 5/16" 1 ML MISC     . blood glucose meter kit and supplies Use to check blood sugars daily E11.65    . Cholecalciferol (VITAMIN D3) 5000 units CAPS Take 5,000 Units by mouth daily.    . Coenzyme Q10 (CO Q 10 PO) Take 1 tablet by mouth daily.    Marland Kitchen erythromycin ophthalmic ointment  Place 1 application into the left eye 4 (four) times daily. 3.5 g 0  . furosemide (LASIX) 80 MG tablet Take 80 mg by mouth daily.    Marland Kitchen gabapentin (NEURONTIN) 300 MG capsule Take 1 capsule by mouth once daily 90 capsule 0  . heparin 1000 UNIT/ML injection heparin (porcine) 1,000 unit/mL    . hydrALAZINE (APRESOLINE) 100 MG tablet Take 1 tablet (100 mg total) by mouth 3 (three) times daily. 270 tablet 3  . isosorbide dinitrate (ISORDIL) 20 MG tablet Take 1 tablet (20 mg total) by mouth 2 (two) times daily. 180 tablet 3  . isosorbide mononitrate (ISMO) 20 MG tablet Take 20 mg by mouth 2 (two) times daily.    . metolazone (ZAROXOLYN) 5 MG tablet Take 5 mg by mouth as needed.    . nitroGLYCERIN (NITROSTAT) 0.4 MG SL tablet Place 1 tablet (0.4 mg total) under the tongue every 5 (five) minutes as needed for chest pain. 25 tablet 0  . NOVOLIN N 100 UNIT/ML injection Inject 30 Units into the skin 2 (two) times daily before a meal.     . NOVOLIN R 100 UNIT/ML injection Inject 15-20 Units into the skin 3 (three) times daily with meals.   5  . ONETOUCH ULTRA test strip USE 1 STRIP TO CHECK GLUCOSE 4 TIMES DAILY 300 each 0  . Potassium Chloride ER 20 MEQ TBCR Take 20 mEq by mouth daily.    . rosuvastatin (CRESTOR) 40 MG tablet Take 1 tablet (40 mg total) by mouth daily. 90 tablet 3  . Transparent Dressings (TEGADERM FIRST AID STYLE) MISC To be used with changing of peritoneal dialysis. Dx z99.2 Dove [phone] 50 each prn  . VITAMIN E PO Take 1 capsule by mouth daily.    Marland Kitchen warfarin (COUMADIN) 5 MG tablet TAKE 1 TABLET BY MOUTH ONCE DAILY AT  6  PM  MONITOR  INR  GOAL  SHOULD  BE  BETWEEN  2-3. 90 tablet 0   . zinc sulfate 220 (50 Zn) MG capsule Take 220 mg by mouth daily.     No facility-administered medications prior to visit.    Allergies  Allergen Reactions  . Hydrocodone Nausea And Vomiting    Other reaction(s): GI Upset (intolerance) Projectile vomiting Projectile vomiting  . Oxycodone Nausea And Vomiting    Other reaction(s): GI Upset (intolerance), Vomiting (intolerance) Projectile vomiting Projectile vomiting  . Dacarbazine Other (See Comments)    Other reaction(s): Unknown  . Tape Dermatitis, Itching and Rash    Patch used at dialysis    ROS Review of Systems    Objective:    Physical Exam  Constitutional: He is oriented to person, place, and time. He appears well-developed and well-nourished.  HENT:  Head: Normocephalic and atraumatic.  Cardiovascular: Normal rate, regular rhythm and normal heart sounds.  Pulmonary/Chest: Effort normal and breath sounds normal.  Musculoskeletal:     Comments: Triggering of middle finger on right hand he also has a contracture of the middle finger on the right hand as well.  Neurological: He is alert and oriented to person, place, and time.  Skin: Skin is warm and dry.  Psychiatric: He has a normal mood and affect. His behavior is normal.    BP (!) 133/51   Pulse 64   Ht 5' 7"  (1.702 m)   Wt 233 lb (105.7 kg)   SpO2 100%   BMI 36.49 kg/m  Wt Readings from Last 3 Encounters:  02/03/20 233 lb (105.7 kg)  01/30/20  233 lb (105.7 kg)  01/06/20 245 lb (111.1 kg)     Health Maintenance Due  Topic Date Due  . OPHTHALMOLOGY EXAM  12/06/2019  . COLONOSCOPY  01/26/2020    There are no preventive care reminders to display for this patient.  Lab Results  Component Value Date   TSH 2.391 02/02/2017   Lab Results  Component Value Date   WBC 7.0 10/27/2019   HGB 12.2 (L) 10/27/2019   HCT 38.7 (L) 10/27/2019   MCV 91.9 10/27/2019   PLT 190 10/27/2019   Lab Results  Component Value Date   NA 139 10/27/2019   K 3.0  (L) 10/27/2019   CO2 27 10/27/2019   GLUCOSE 79 10/27/2019   BUN 43 (H) 10/27/2019   CREATININE 3.66 (H) 10/27/2019   BILITOT 1.1 10/27/2019   ALKPHOS 105 10/27/2019   AST 31 10/27/2019   ALT 19 10/27/2019   PROT 6.7 10/27/2019   ALBUMIN 3.1 (L) 10/27/2019   CALCIUM 9.2 10/27/2019   ANIONGAP 13 10/27/2019   GFR 38.90 (L) 03/10/2011   Lab Results  Component Value Date   CHOL 103 08/19/2019   Lab Results  Component Value Date   HDL 37 (L) 08/19/2019   Lab Results  Component Value Date   LDLCALC 48 08/19/2019   Lab Results  Component Value Date   TRIG 97 08/19/2019   Lab Results  Component Value Date   CHOLHDL 2.8 08/19/2019   Lab Results  Component Value Date   HGBA1C 6.7 (A) 02/03/2020      Assessment & Plan:   Problem List Items Addressed This Visit      Cardiovascular and Mediastinum   HYPERTENSION, BENIGN SYSTEMIC    Pressure looks okay today.  Continue to monitor.      Atrial fibrillation ([place]) - Primary    INR at goal. No change today.  The anticoagulation flowsheet.      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  Like to try this for a week and see how his blood sugars are doing and then we can always work on adjusting the Novolin in bedtime dose as long as he is not having any more hypoglycemic events around lunchtime. Checking glucose 4-5 times per day bc of his dialysis.  Will try to apply for extra strips.  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       Relevant Orders   POCT glycosylated hemoglobin (Hb A1C) (Completed)   POCT INR (Completed)     Other   Pain in both hands    Other Visit Diagnoses    Trigger middle finger of right hand       Dupuytren contracture          Her finger right hand-we will refer to our sports medicine provider for possible injection and further evaluation and treatment options.  Dupuytren's contracture-discussed diagnosis with him and encouraged him to work on extending the fingers daily.  And if it progresses the please let us know.  Bilateral hand pain-we decided to add Elavil at bedtime and says that it was very sedating so he is actually only been taking a half a tab.  He says he  is been tolerating that much better and actually does feel like it is helpful for his hand pain.  We also discussed doing possible further work-up for carpal tunnel.   No orders of the defined types were placed in this encounter.   Follow-up: Return in about 4 weeks (around 03/02/2020) for Diabetes follow-up and INR.    [name], MD

## 2020-02-03 NOTE — Progress Notes (Signed)
Pt here for INR check no missed doses, diet changes,bruising,bleeding,CP,SOB.Louis [name], [name], CMA

## 2020-02-03 NOTE — Progress Notes (Signed)
    Procedures performed today:    Procedure: Real-time Ultrasound Guided injection of the right third flexor tendon sheath Device: Samsung [id number]  Verbal informed consent obtained.  Time-out conducted.  Noted no overlying erythema, induration, or other signs of local infection.  Skin prepped in a sterile fashion.  Local anesthesia: Topical Ethyl chloride.  With sterile technique and under real time ultrasound guidance:  1/2 cc lidocaine, 1/2 cc Kenalog 40 injected easily completed without difficulty  Pain immediately resolved suggesting accurate placement of the medication.  Advised to call if fevers/chills, erythema, induration, drainage, or persistent bleeding.  Images permanently stored and available for review in the ultrasound unit.  Impression: Technically successful ultrasound guided injection.  Independent interpretation of notes and tests performed by another provider:   None.  Impression and Recommendations:    Trigger finger, right middle finger Of the right middle finger, injection placed into the flexor digitorum tendon sheath. Home rehab exercises given. Return to see me in 1 month.    ___________________________________________ Gwen Her. Dianah Field, M.D., ABFM., CAQSM. Primary Care and [place] Instructor of [employer] [facility]

## 2020-02-03 NOTE — Assessment & Plan Note (Signed)
Of the right middle finger, injection placed into the flexor digitorum tendon sheath. Home rehab exercises given. Return to see me in 1 month.

## 2020-02-04 DIAGNOSIS — R17 Unspecified jaundice: Secondary | ICD-10-CM | POA: Diagnosis not present

## 2020-02-04 DIAGNOSIS — N186 End stage renal disease: Secondary | ICD-10-CM | POA: Diagnosis not present

## 2020-02-04 DIAGNOSIS — D631 Anemia in chronic kidney disease: Secondary | ICD-10-CM | POA: Diagnosis not present

## 2020-02-04 DIAGNOSIS — N2581 Secondary hyperparathyroidism of renal origin: Secondary | ICD-10-CM | POA: Diagnosis not present

## 2020-02-04 DIAGNOSIS — Z992 Dependence on renal dialysis: Secondary | ICD-10-CM | POA: Diagnosis not present

## 2020-02-04 DIAGNOSIS — Z79899 Other long term (current) drug therapy: Secondary | ICD-10-CM | POA: Diagnosis not present

## 2020-02-05 DIAGNOSIS — Z961 Presence of intraocular lens: Secondary | ICD-10-CM | POA: Diagnosis not present

## 2020-02-05 DIAGNOSIS — Z992 Dependence on renal dialysis: Secondary | ICD-10-CM | POA: Diagnosis not present

## 2020-02-05 DIAGNOSIS — N2581 Secondary hyperparathyroidism of renal origin: Secondary | ICD-10-CM | POA: Diagnosis not present

## 2020-02-05 DIAGNOSIS — D631 Anemia in chronic kidney disease: Secondary | ICD-10-CM | POA: Diagnosis not present

## 2020-02-05 DIAGNOSIS — E113213 Type 2 diabetes mellitus with mild nonproliferative diabetic retinopathy with macular edema, bilateral: Secondary | ICD-10-CM | POA: Diagnosis not present

## 2020-02-05 DIAGNOSIS — S0502XA Injury of conjunctiva and corneal abrasion without foreign body, left eye, initial encounter: Secondary | ICD-10-CM | POA: Diagnosis not present

## 2020-02-05 DIAGNOSIS — Z79899 Other long term (current) drug therapy: Secondary | ICD-10-CM | POA: Diagnosis not present

## 2020-02-05 DIAGNOSIS — N186 End stage renal disease: Secondary | ICD-10-CM | POA: Diagnosis not present

## 2020-02-05 DIAGNOSIS — R17 Unspecified jaundice: Secondary | ICD-10-CM | POA: Diagnosis not present

## 2020-02-06 DIAGNOSIS — R17 Unspecified jaundice: Secondary | ICD-10-CM | POA: Diagnosis not present

## 2020-02-06 DIAGNOSIS — Z992 Dependence on renal dialysis: Secondary | ICD-10-CM | POA: Diagnosis not present

## 2020-02-06 DIAGNOSIS — N2581 Secondary hyperparathyroidism of renal origin: Secondary | ICD-10-CM | POA: Diagnosis not present

## 2020-02-06 DIAGNOSIS — N186 End stage renal disease: Secondary | ICD-10-CM | POA: Diagnosis not present

## 2020-02-06 DIAGNOSIS — D631 Anemia in chronic kidney disease: Secondary | ICD-10-CM | POA: Diagnosis not present

## 2020-02-06 DIAGNOSIS — Z79899 Other long term (current) drug therapy: Secondary | ICD-10-CM | POA: Diagnosis not present

## 2020-02-07 DIAGNOSIS — D631 Anemia in chronic kidney disease: Secondary | ICD-10-CM | POA: Diagnosis not present

## 2020-02-07 DIAGNOSIS — N2581 Secondary hyperparathyroidism of renal origin: Secondary | ICD-10-CM | POA: Diagnosis not present

## 2020-02-07 DIAGNOSIS — N186 End stage renal disease: Secondary | ICD-10-CM | POA: Diagnosis not present

## 2020-02-07 DIAGNOSIS — R17 Unspecified jaundice: Secondary | ICD-10-CM | POA: Diagnosis not present

## 2020-02-07 DIAGNOSIS — Z992 Dependence on renal dialysis: Secondary | ICD-10-CM | POA: Diagnosis not present

## 2020-02-07 DIAGNOSIS — Z79899 Other long term (current) drug therapy: Secondary | ICD-10-CM | POA: Diagnosis not present

## 2020-02-08 DIAGNOSIS — D631 Anemia in chronic kidney disease: Secondary | ICD-10-CM | POA: Diagnosis not present

## 2020-02-08 DIAGNOSIS — N2581 Secondary hyperparathyroidism of renal origin: Secondary | ICD-10-CM | POA: Diagnosis not present

## 2020-02-08 DIAGNOSIS — N186 End stage renal disease: Secondary | ICD-10-CM | POA: Diagnosis not present

## 2020-02-08 DIAGNOSIS — R17 Unspecified jaundice: Secondary | ICD-10-CM | POA: Diagnosis not present

## 2020-02-08 DIAGNOSIS — Z992 Dependence on renal dialysis: Secondary | ICD-10-CM | POA: Diagnosis not present

## 2020-02-08 DIAGNOSIS — Z79899 Other long term (current) drug therapy: Secondary | ICD-10-CM | POA: Diagnosis not present

## 2020-02-09 ENCOUNTER — Telehealth: Payer: Self-pay

## 2020-02-09 DIAGNOSIS — M1000 Idiopathic gout, unspecified site: Secondary | ICD-10-CM

## 2020-02-09 DIAGNOSIS — N186 End stage renal disease: Secondary | ICD-10-CM | POA: Diagnosis not present

## 2020-02-09 DIAGNOSIS — Z992 Dependence on renal dialysis: Secondary | ICD-10-CM | POA: Diagnosis not present

## 2020-02-09 DIAGNOSIS — R17 Unspecified jaundice: Secondary | ICD-10-CM | POA: Diagnosis not present

## 2020-02-09 DIAGNOSIS — D631 Anemia in chronic kidney disease: Secondary | ICD-10-CM | POA: Diagnosis not present

## 2020-02-09 DIAGNOSIS — M25561 Pain in right knee: Secondary | ICD-10-CM

## 2020-02-09 DIAGNOSIS — Z79899 Other long term (current) drug therapy: Secondary | ICD-10-CM | POA: Diagnosis not present

## 2020-02-09 DIAGNOSIS — N2581 Secondary hyperparathyroidism of renal origin: Secondary | ICD-10-CM | POA: Diagnosis not present

## 2020-02-09 DIAGNOSIS — M5412 Radiculopathy, cervical region: Secondary | ICD-10-CM

## 2020-02-09 NOTE — Telephone Encounter (Signed)
I update last OV note indicated his frequent use of test strips.

## 2020-02-09 NOTE — Telephone Encounter (Signed)
Faxed information to pharmacy

## 2020-02-09 NOTE — Telephone Encounter (Signed)
Charlie's wife called and states he needs a prescription for DME supplies faxed to [phone]. Environmental consultant. Pended orders.   She also wanted to know if we have faxed the information to the pharmacy about the test strips. It has been faxed.

## 2020-02-10 DIAGNOSIS — Z992 Dependence on renal dialysis: Secondary | ICD-10-CM | POA: Diagnosis not present

## 2020-02-10 DIAGNOSIS — Z79899 Other long term (current) drug therapy: Secondary | ICD-10-CM | POA: Diagnosis not present

## 2020-02-10 DIAGNOSIS — N2581 Secondary hyperparathyroidism of renal origin: Secondary | ICD-10-CM | POA: Diagnosis not present

## 2020-02-10 DIAGNOSIS — R17 Unspecified jaundice: Secondary | ICD-10-CM | POA: Diagnosis not present

## 2020-02-10 DIAGNOSIS — N186 End stage renal disease: Secondary | ICD-10-CM | POA: Diagnosis not present

## 2020-02-10 DIAGNOSIS — D631 Anemia in chronic kidney disease: Secondary | ICD-10-CM | POA: Diagnosis not present

## 2020-02-10 MED ORDER — AMBULATORY NON FORMULARY MEDICATION: 0 refills | Status: DC

## 2020-02-10 NOTE — Telephone Encounter (Signed)
rx faxed. Confirmation received.Maryruth Eve, [name], CMA

## 2020-02-11 DIAGNOSIS — R17 Unspecified jaundice: Secondary | ICD-10-CM | POA: Diagnosis not present

## 2020-02-11 DIAGNOSIS — N2581 Secondary hyperparathyroidism of renal origin: Secondary | ICD-10-CM | POA: Diagnosis not present

## 2020-02-11 DIAGNOSIS — D631 Anemia in chronic kidney disease: Secondary | ICD-10-CM | POA: Diagnosis not present

## 2020-02-11 DIAGNOSIS — Z79899 Other long term (current) drug therapy: Secondary | ICD-10-CM | POA: Diagnosis not present

## 2020-02-11 DIAGNOSIS — Z992 Dependence on renal dialysis: Secondary | ICD-10-CM | POA: Diagnosis not present

## 2020-02-11 DIAGNOSIS — N186 End stage renal disease: Secondary | ICD-10-CM | POA: Diagnosis not present

## 2020-02-12 DIAGNOSIS — N2581 Secondary hyperparathyroidism of renal origin: Secondary | ICD-10-CM | POA: Diagnosis not present

## 2020-02-12 DIAGNOSIS — Z79899 Other long term (current) drug therapy: Secondary | ICD-10-CM | POA: Diagnosis not present

## 2020-02-12 DIAGNOSIS — R17 Unspecified jaundice: Secondary | ICD-10-CM | POA: Diagnosis not present

## 2020-02-12 DIAGNOSIS — Z992 Dependence on renal dialysis: Secondary | ICD-10-CM | POA: Diagnosis not present

## 2020-02-12 DIAGNOSIS — N186 End stage renal disease: Secondary | ICD-10-CM | POA: Diagnosis not present

## 2020-02-12 DIAGNOSIS — D631 Anemia in chronic kidney disease: Secondary | ICD-10-CM | POA: Diagnosis not present

## 2020-02-13 DIAGNOSIS — Z79899 Other long term (current) drug therapy: Secondary | ICD-10-CM | POA: Diagnosis not present

## 2020-02-13 DIAGNOSIS — D631 Anemia in chronic kidney disease: Secondary | ICD-10-CM | POA: Diagnosis not present

## 2020-02-13 DIAGNOSIS — R17 Unspecified jaundice: Secondary | ICD-10-CM | POA: Diagnosis not present

## 2020-02-13 DIAGNOSIS — Z992 Dependence on renal dialysis: Secondary | ICD-10-CM | POA: Diagnosis not present

## 2020-02-13 DIAGNOSIS — N186 End stage renal disease: Secondary | ICD-10-CM | POA: Diagnosis not present

## 2020-02-13 DIAGNOSIS — N2581 Secondary hyperparathyroidism of renal origin: Secondary | ICD-10-CM | POA: Diagnosis not present

## 2020-02-14 DIAGNOSIS — R17 Unspecified jaundice: Secondary | ICD-10-CM | POA: Diagnosis not present

## 2020-02-14 DIAGNOSIS — N186 End stage renal disease: Secondary | ICD-10-CM | POA: Diagnosis not present

## 2020-02-14 DIAGNOSIS — N2581 Secondary hyperparathyroidism of renal origin: Secondary | ICD-10-CM | POA: Diagnosis not present

## 2020-02-14 DIAGNOSIS — Z992 Dependence on renal dialysis: Secondary | ICD-10-CM | POA: Diagnosis not present

## 2020-02-14 DIAGNOSIS — Z79899 Other long term (current) drug therapy: Secondary | ICD-10-CM | POA: Diagnosis not present

## 2020-02-14 DIAGNOSIS — D631 Anemia in chronic kidney disease: Secondary | ICD-10-CM | POA: Diagnosis not present

## 2020-02-15 DIAGNOSIS — Z992 Dependence on renal dialysis: Secondary | ICD-10-CM | POA: Diagnosis not present

## 2020-02-15 DIAGNOSIS — N2581 Secondary hyperparathyroidism of renal origin: Secondary | ICD-10-CM | POA: Diagnosis not present

## 2020-02-15 DIAGNOSIS — R17 Unspecified jaundice: Secondary | ICD-10-CM | POA: Diagnosis not present

## 2020-02-15 DIAGNOSIS — N186 End stage renal disease: Secondary | ICD-10-CM | POA: Diagnosis not present

## 2020-02-15 DIAGNOSIS — Z79899 Other long term (current) drug therapy: Secondary | ICD-10-CM | POA: Diagnosis not present

## 2020-02-15 DIAGNOSIS — D631 Anemia in chronic kidney disease: Secondary | ICD-10-CM | POA: Diagnosis not present

## 2020-02-16 ENCOUNTER — Encounter: Payer: Self-pay | Admitting: Family Medicine

## 2020-02-16 DIAGNOSIS — Z992 Dependence on renal dialysis: Secondary | ICD-10-CM | POA: Diagnosis not present

## 2020-02-16 DIAGNOSIS — Z79899 Other long term (current) drug therapy: Secondary | ICD-10-CM | POA: Diagnosis not present

## 2020-02-16 DIAGNOSIS — R17 Unspecified jaundice: Secondary | ICD-10-CM | POA: Diagnosis not present

## 2020-02-16 DIAGNOSIS — N2581 Secondary hyperparathyroidism of renal origin: Secondary | ICD-10-CM | POA: Diagnosis not present

## 2020-02-16 DIAGNOSIS — N186 End stage renal disease: Secondary | ICD-10-CM | POA: Diagnosis not present

## 2020-02-16 DIAGNOSIS — D631 Anemia in chronic kidney disease: Secondary | ICD-10-CM | POA: Diagnosis not present

## 2020-02-17 DIAGNOSIS — N2581 Secondary hyperparathyroidism of renal origin: Secondary | ICD-10-CM | POA: Diagnosis not present

## 2020-02-17 DIAGNOSIS — R17 Unspecified jaundice: Secondary | ICD-10-CM | POA: Diagnosis not present

## 2020-02-17 DIAGNOSIS — Z79899 Other long term (current) drug therapy: Secondary | ICD-10-CM | POA: Diagnosis not present

## 2020-02-17 DIAGNOSIS — Z992 Dependence on renal dialysis: Secondary | ICD-10-CM | POA: Diagnosis not present

## 2020-02-17 DIAGNOSIS — D631 Anemia in chronic kidney disease: Secondary | ICD-10-CM | POA: Diagnosis not present

## 2020-02-17 DIAGNOSIS — N186 End stage renal disease: Secondary | ICD-10-CM | POA: Diagnosis not present

## 2020-02-18 DIAGNOSIS — D631 Anemia in chronic kidney disease: Secondary | ICD-10-CM | POA: Diagnosis not present

## 2020-02-18 DIAGNOSIS — N2581 Secondary hyperparathyroidism of renal origin: Secondary | ICD-10-CM | POA: Diagnosis not present

## 2020-02-18 DIAGNOSIS — Z79899 Other long term (current) drug therapy: Secondary | ICD-10-CM | POA: Diagnosis not present

## 2020-02-18 DIAGNOSIS — N186 End stage renal disease: Secondary | ICD-10-CM | POA: Diagnosis not present

## 2020-02-18 DIAGNOSIS — R17 Unspecified jaundice: Secondary | ICD-10-CM | POA: Diagnosis not present

## 2020-02-18 DIAGNOSIS — Z992 Dependence on renal dialysis: Secondary | ICD-10-CM | POA: Diagnosis not present

## 2020-02-19 ENCOUNTER — Other Ambulatory Visit: Payer: Self-pay

## 2020-02-19 DIAGNOSIS — Z992 Dependence on renal dialysis: Secondary | ICD-10-CM | POA: Diagnosis not present

## 2020-02-19 DIAGNOSIS — N186 End stage renal disease: Secondary | ICD-10-CM | POA: Diagnosis not present

## 2020-02-19 DIAGNOSIS — N2581 Secondary hyperparathyroidism of renal origin: Secondary | ICD-10-CM | POA: Diagnosis not present

## 2020-02-19 DIAGNOSIS — D631 Anemia in chronic kidney disease: Secondary | ICD-10-CM | POA: Diagnosis not present

## 2020-02-19 DIAGNOSIS — Z4932 Encounter for adequacy testing for peritoneal dialysis: Secondary | ICD-10-CM | POA: Diagnosis not present

## 2020-02-19 DIAGNOSIS — E1122 Type 2 diabetes mellitus with diabetic chronic kidney disease: Secondary | ICD-10-CM | POA: Diagnosis not present

## 2020-02-19 MED ORDER — AMLODIPINE BESYLATE 10 MG PO TABS: 10.0000 mg | ORAL_TABLET | Freq: Every day | ORAL | 0 refills | Status: DC

## 2020-02-20 DIAGNOSIS — D631 Anemia in chronic kidney disease: Secondary | ICD-10-CM | POA: Diagnosis not present

## 2020-02-20 DIAGNOSIS — N2581 Secondary hyperparathyroidism of renal origin: Secondary | ICD-10-CM | POA: Diagnosis not present

## 2020-02-20 DIAGNOSIS — Z992 Dependence on renal dialysis: Secondary | ICD-10-CM | POA: Diagnosis not present

## 2020-02-20 DIAGNOSIS — N186 End stage renal disease: Secondary | ICD-10-CM | POA: Diagnosis not present

## 2020-02-20 DIAGNOSIS — Z4932 Encounter for adequacy testing for peritoneal dialysis: Secondary | ICD-10-CM | POA: Diagnosis not present

## 2020-02-21 DIAGNOSIS — N2581 Secondary hyperparathyroidism of renal origin: Secondary | ICD-10-CM | POA: Diagnosis not present

## 2020-02-21 DIAGNOSIS — D631 Anemia in chronic kidney disease: Secondary | ICD-10-CM | POA: Diagnosis not present

## 2020-02-21 DIAGNOSIS — Z992 Dependence on renal dialysis: Secondary | ICD-10-CM | POA: Diagnosis not present

## 2020-02-21 DIAGNOSIS — N186 End stage renal disease: Secondary | ICD-10-CM | POA: Diagnosis not present

## 2020-02-21 DIAGNOSIS — Z4932 Encounter for adequacy testing for peritoneal dialysis: Secondary | ICD-10-CM | POA: Diagnosis not present

## 2020-02-22 DIAGNOSIS — N2581 Secondary hyperparathyroidism of renal origin: Secondary | ICD-10-CM | POA: Diagnosis not present

## 2020-02-22 DIAGNOSIS — N186 End stage renal disease: Secondary | ICD-10-CM | POA: Diagnosis not present

## 2020-02-22 DIAGNOSIS — Z992 Dependence on renal dialysis: Secondary | ICD-10-CM | POA: Diagnosis not present

## 2020-02-22 DIAGNOSIS — Z4932 Encounter for adequacy testing for peritoneal dialysis: Secondary | ICD-10-CM | POA: Diagnosis not present

## 2020-02-22 DIAGNOSIS — D631 Anemia in chronic kidney disease: Secondary | ICD-10-CM | POA: Diagnosis not present

## 2020-02-23 DIAGNOSIS — N2581 Secondary hyperparathyroidism of renal origin: Secondary | ICD-10-CM | POA: Diagnosis not present

## 2020-02-23 DIAGNOSIS — N186 End stage renal disease: Secondary | ICD-10-CM | POA: Diagnosis not present

## 2020-02-23 DIAGNOSIS — Z992 Dependence on renal dialysis: Secondary | ICD-10-CM | POA: Diagnosis not present

## 2020-02-23 DIAGNOSIS — D631 Anemia in chronic kidney disease: Secondary | ICD-10-CM | POA: Diagnosis not present

## 2020-02-23 DIAGNOSIS — Z4932 Encounter for adequacy testing for peritoneal dialysis: Secondary | ICD-10-CM | POA: Diagnosis not present

## 2020-02-24 DIAGNOSIS — N2581 Secondary hyperparathyroidism of renal origin: Secondary | ICD-10-CM | POA: Diagnosis not present

## 2020-02-24 DIAGNOSIS — N186 End stage renal disease: Secondary | ICD-10-CM | POA: Diagnosis not present

## 2020-02-24 DIAGNOSIS — D631 Anemia in chronic kidney disease: Secondary | ICD-10-CM | POA: Diagnosis not present

## 2020-02-24 DIAGNOSIS — Z4932 Encounter for adequacy testing for peritoneal dialysis: Secondary | ICD-10-CM | POA: Diagnosis not present

## 2020-02-24 DIAGNOSIS — Z992 Dependence on renal dialysis: Secondary | ICD-10-CM | POA: Diagnosis not present

## 2020-02-25 DIAGNOSIS — Z4932 Encounter for adequacy testing for peritoneal dialysis: Secondary | ICD-10-CM | POA: Diagnosis not present

## 2020-02-25 DIAGNOSIS — N2581 Secondary hyperparathyroidism of renal origin: Secondary | ICD-10-CM | POA: Diagnosis not present

## 2020-02-25 DIAGNOSIS — R82998 Other abnormal findings in urine: Secondary | ICD-10-CM | POA: Diagnosis not present

## 2020-02-25 DIAGNOSIS — D631 Anemia in chronic kidney disease: Secondary | ICD-10-CM | POA: Diagnosis not present

## 2020-02-25 DIAGNOSIS — E1122 Type 2 diabetes mellitus with diabetic chronic kidney disease: Secondary | ICD-10-CM | POA: Diagnosis not present

## 2020-02-25 DIAGNOSIS — Z992 Dependence on renal dialysis: Secondary | ICD-10-CM | POA: Diagnosis not present

## 2020-02-25 DIAGNOSIS — N186 End stage renal disease: Secondary | ICD-10-CM | POA: Diagnosis not present

## 2020-02-26 DIAGNOSIS — Z992 Dependence on renal dialysis: Secondary | ICD-10-CM | POA: Diagnosis not present

## 2020-02-26 DIAGNOSIS — E113213 Type 2 diabetes mellitus with mild nonproliferative diabetic retinopathy with macular edema, bilateral: Secondary | ICD-10-CM | POA: Diagnosis not present

## 2020-02-26 DIAGNOSIS — Z4932 Encounter for adequacy testing for peritoneal dialysis: Secondary | ICD-10-CM | POA: Diagnosis not present

## 2020-02-26 DIAGNOSIS — N2581 Secondary hyperparathyroidism of renal origin: Secondary | ICD-10-CM | POA: Diagnosis not present

## 2020-02-26 DIAGNOSIS — D631 Anemia in chronic kidney disease: Secondary | ICD-10-CM | POA: Diagnosis not present

## 2020-02-26 DIAGNOSIS — N186 End stage renal disease: Secondary | ICD-10-CM | POA: Diagnosis not present

## 2020-02-26 DIAGNOSIS — Z961 Presence of intraocular lens: Secondary | ICD-10-CM | POA: Diagnosis not present

## 2020-02-27 DIAGNOSIS — N2581 Secondary hyperparathyroidism of renal origin: Secondary | ICD-10-CM | POA: Diagnosis not present

## 2020-02-27 DIAGNOSIS — D631 Anemia in chronic kidney disease: Secondary | ICD-10-CM | POA: Diagnosis not present

## 2020-02-27 DIAGNOSIS — Z4932 Encounter for adequacy testing for peritoneal dialysis: Secondary | ICD-10-CM | POA: Diagnosis not present

## 2020-02-27 DIAGNOSIS — N186 End stage renal disease: Secondary | ICD-10-CM | POA: Diagnosis not present

## 2020-02-27 DIAGNOSIS — Z992 Dependence on renal dialysis: Secondary | ICD-10-CM | POA: Diagnosis not present

## 2020-02-28 DIAGNOSIS — N186 End stage renal disease: Secondary | ICD-10-CM | POA: Diagnosis not present

## 2020-02-28 DIAGNOSIS — Z992 Dependence on renal dialysis: Secondary | ICD-10-CM | POA: Diagnosis not present

## 2020-02-28 DIAGNOSIS — D631 Anemia in chronic kidney disease: Secondary | ICD-10-CM | POA: Diagnosis not present

## 2020-02-28 DIAGNOSIS — Z4932 Encounter for adequacy testing for peritoneal dialysis: Secondary | ICD-10-CM | POA: Diagnosis not present

## 2020-02-28 DIAGNOSIS — N2581 Secondary hyperparathyroidism of renal origin: Secondary | ICD-10-CM | POA: Diagnosis not present

## 2020-02-29 DIAGNOSIS — D631 Anemia in chronic kidney disease: Secondary | ICD-10-CM | POA: Diagnosis not present

## 2020-02-29 DIAGNOSIS — N186 End stage renal disease: Secondary | ICD-10-CM | POA: Diagnosis not present

## 2020-02-29 DIAGNOSIS — N2581 Secondary hyperparathyroidism of renal origin: Secondary | ICD-10-CM | POA: Diagnosis not present

## 2020-02-29 DIAGNOSIS — Z992 Dependence on renal dialysis: Secondary | ICD-10-CM | POA: Diagnosis not present

## 2020-02-29 DIAGNOSIS — Z4932 Encounter for adequacy testing for peritoneal dialysis: Secondary | ICD-10-CM | POA: Diagnosis not present

## 2020-03-01 DIAGNOSIS — D631 Anemia in chronic kidney disease: Secondary | ICD-10-CM | POA: Diagnosis not present

## 2020-03-01 DIAGNOSIS — N186 End stage renal disease: Secondary | ICD-10-CM | POA: Diagnosis not present

## 2020-03-01 DIAGNOSIS — Z992 Dependence on renal dialysis: Secondary | ICD-10-CM | POA: Diagnosis not present

## 2020-03-01 DIAGNOSIS — N2581 Secondary hyperparathyroidism of renal origin: Secondary | ICD-10-CM | POA: Diagnosis not present

## 2020-03-01 DIAGNOSIS — Z4932 Encounter for adequacy testing for peritoneal dialysis: Secondary | ICD-10-CM | POA: Diagnosis not present

## 2020-03-02 ENCOUNTER — Other Ambulatory Visit: Payer: Self-pay

## 2020-03-02 ENCOUNTER — Ambulatory Visit: Payer: Medicare Other | Admitting: Sports Medicine

## 2020-03-02 ENCOUNTER — Ambulatory Visit (INDEPENDENT_AMBULATORY_CARE_PROVIDER_SITE_OTHER): Payer: Medicare Other | Admitting: Family Medicine

## 2020-03-02 ENCOUNTER — Encounter: Payer: Self-pay | Admitting: Family Medicine

## 2020-03-02 ENCOUNTER — Telehealth: Payer: Self-pay | Admitting: *Deleted

## 2020-03-02 VITALS — BP 149/63 | HR 84 | Ht 71.0 in | Wt 227.0 lb

## 2020-03-02 DIAGNOSIS — N2581 Secondary hyperparathyroidism of renal origin: Secondary | ICD-10-CM | POA: Diagnosis not present

## 2020-03-02 DIAGNOSIS — Z4932 Encounter for adequacy testing for peritoneal dialysis: Secondary | ICD-10-CM | POA: Diagnosis not present

## 2020-03-02 DIAGNOSIS — E113213 Type 2 diabetes mellitus with mild nonproliferative diabetic retinopathy with macular edema, bilateral: Secondary | ICD-10-CM

## 2020-03-02 DIAGNOSIS — Z992 Dependence on renal dialysis: Secondary | ICD-10-CM | POA: Diagnosis not present

## 2020-03-02 DIAGNOSIS — N186 End stage renal disease: Secondary | ICD-10-CM | POA: Diagnosis not present

## 2020-03-02 DIAGNOSIS — E11610 Type 2 diabetes mellitus with diabetic neuropathic arthropathy: Secondary | ICD-10-CM

## 2020-03-02 DIAGNOSIS — D631 Anemia in chronic kidney disease: Secondary | ICD-10-CM | POA: Diagnosis not present

## 2020-03-02 DIAGNOSIS — I10 Essential (primary) hypertension: Secondary | ICD-10-CM

## 2020-03-02 DIAGNOSIS — I4891 Unspecified atrial fibrillation: Secondary | ICD-10-CM

## 2020-03-02 LAB — POCT INR: INR: 1.5 — AB (ref 2.0–3.0)

## 2020-03-02 NOTE — Patient Instructions (Addendum)
Increase Coumadin to a whole tab on Mondays, Wednesdays and Saturdays and continue with a half a tab the other 4 days a week on Tuesdays, Thursdays, Fridays, and Sundays.  Check INR in 10 days with nurse visit.

## 2020-03-02 NOTE — Assessment & Plan Note (Signed)
He reports that his eye exam is up-to-date so we will call to get that report.  He is followed for diabetic retinopathy.

## 2020-03-02 NOTE — Assessment & Plan Note (Signed)
INR is subtherapeutic.  No recent changes to medication. Reports taking it significantly.

## 2020-03-02 NOTE — Telephone Encounter (Signed)
Spoke with Estill Bamberg and she stated that they had tried yesterday to resend Louis [name]' information for his testing supplies and still have yet to receive a response regarding this.   I verified the fax number that I had for the request for additional documentation with her and she stated that it was correct.

## 2020-03-02 NOTE — Telephone Encounter (Signed)
refaxed the pt's information along with his readings that he brought in today.   Confirmation received

## 2020-03-02 NOTE — Progress Notes (Signed)
Established Patient Office Visit  Subjective:  Patient ID: Louis [name], male    DOB: Jul 01, 1946  Age: 74 y.o. MRN: [id number]  CC:  Chief Complaint  Patient presents with  . Atrial Fibrillation    HPI Louis [name] presents for   F/u Diabetes -she brought in home glucose log.  Checking glucose 5 times a day.  Morning sugars have been mostly under 200s though he did have one morning where it was 301.  He has been giving 32 units of Novolin and in the morning and 58 units of Novolin and in the evening.  Using a sliding scale for breakfast lunch and dinnertime.  Overall this has been working pretty well.  He did have 2 hypoglycemic events at lunch.  One was 58 and 1 was 71.  Afib - reports that he is taking his medication regularly did not miss any doses.  Has been eating a few less vegetables and grains has been eating a diarrhea quite frequently.  In fact has been eating at dairy a little bit more frequently.   Past Medical History:  Diagnosis Date  . Brittle bones    per pt, has soft bones in right foot/wears boot cast!  . CAD (coronary artery disease)   . Cataract    Bil/ surg scheduled for right eye 01/18/17/ left eye 02/08/17  . Charcot ankle, right 2019  . CHF (congestive heart failure) ([place]) 2015  . Diabetes mellitus   . Heart failure, diastolic ([place])   . Hyperlipidemia   . Hypertension   . Macular edema 2014  . OSA on CPAP   . Personal history of colonic polyps - adenomas 01/28/2014  . Renal insufficiency    being examined for dialysis for home dialysis [facility]  . Shortness of breath dyspnea   . Syncope and collapse     Past Surgical History:  Procedure Laterality Date  . CARPAL TUNNEL RELEASE     left hand  . COLONOSCOPY    . INTRAVASCULAR PRESSURE WIRE/FFR STUDY N/A 12/18/2018   Procedure: INTRAVASCULAR PRESSURE WIRE/FFR STUDY;  Surgeon: Wellington Hampshire, MD;  Location: [facility];  Service: Cardiovascular;  Laterality: N/A;  . IR FLUORO GUIDE  CV LINE RIGHT  12/16/2018  . IR US GUIDE VASC ACCESS RIGHT  12/16/2018  . KNEE ARTHROSCOPY Right 09/13/2016   [facility], Dr. Dorna Leitz  . PILONIDAL CYST EXCISION    . RIGHT/LEFT HEART CATH AND CORONARY ANGIOGRAPHY N/A 12/18/2018   Procedure: RIGHT/LEFT HEART CATH AND CORONARY ANGIOGRAPHY;  Surgeon: Wellington Hampshire, MD;  Location: [facility];  Service: Cardiovascular;  Laterality: N/A;    Family History  Problem Relation Age of Onset  . Diabetes Mother   . Kidney disease Mother   . Diabetes Father   . Coronary artery disease Father   . Diabetes Brother   . Diabetes Sister   . Prostate cancer Maternal Uncle   . Diabetes Maternal Grandmother   . Cancer Paternal Grandmother        unknown  . Heart attack Paternal Grandfather   . Colon cancer Neg Hx     Social History   Socioeconomic History  . Marital status: Married    Spouse name: [name]  . Number of children: 5  . Years of education: 90  . Highest education level: 12th grade  Occupational History  . Occupation: Warehouse    Comment: retired  Tobacco Use  . Smoking status: Former Smoker  Packs/day: 0.50    Years: 20.00    Pack years: 10.00    Types: Cigarettes    Quit date: 03/06/1977    Years since quitting: 43.0  . Smokeless tobacco: Never Used  Substance and Sexual Activity  . Alcohol use: No  . Drug use: No  . Sexual activity: Not Currently  Other Topics Concern  . Not on file  Social History Narrative   Drinks a cup of coffee daily. Drinks a lot of water daily. Goes out with church members during the week   Social Determinants of Health   Financial Resource Strain:   . Difficulty of Paying Living Expenses:   Food Insecurity:   . Worried About Charity fundraiser in the Last Year:   . Arboriculturist in the Last Year:   Transportation Needs:   . Film/video editor (Medical):   Marland Kitchen Lack of Transportation (Non-Medical):   Physical Activity:   . Days of Exercise per Week:   .  Minutes of Exercise per Session:   Stress:   . Feeling of Stress :   Social Connections:   . Frequency of Communication with Friends and Family:   . Frequency of Social Gatherings with Friends and Family:   . Attends Religious Services:   . Active Member of Clubs or Organizations:   . Attends Archivist Meetings:   Marland Kitchen Marital Status:   Intimate Partner Violence:   . Fear of Current or Ex-Partner:   . Emotionally Abused:   Marland Kitchen Physically Abused:   . Sexually Abused:     Outpatient Medications Prior to Visit  Medication Sig Dispense Refill  . allopurinol (ZYLOPRIM) 100 MG tablet TAKE 1 & 1/2 (ONE & ONE-HALF) TABLETS BY MOUTH TWICE DAILY 270 tablet 0  . AMBULATORY NON FORMULARY MEDICATION Continuous positive airway pressure (CPAP) device: Auto titrate minimum 4 cm H20 to maximum of 20 cm H2O with pressure. Please provide all supplemental supplies as needed. Fax to: [phone] Units 1  . AMBULATORY NON FORMULARY MEDICATION Shower chair - Dx: M25.561, M54.12, M10.00 - Fax to - [phone] 1 each 0  . AMBULATORY NON FORMULARY MEDICATION Rollator walker with seat. Dx: M25.561, M54.12, M10.00 - Fax to - [phone] 1 each 0  . amitriptyline (ELAVIL) 25 MG tablet Take 1-2 tablets (25-50 mg total) by mouth at bedtime. (Patient taking differently: Take 25-50 mg by mouth at bedtime. Taking 12.5 mg at bedtime) 60 tablet 1  . amLODipine (NORVASC) 10 MG tablet Take 1 tablet (10 mg total) by mouth daily. Pt is taking this in the morning 90 tablet 0  . B-D INS SYR ULTRAFINE 1CC/31G 31G X 5/16" 1 ML MISC     . blood glucose meter kit and supplies Use to check blood sugars daily E11.65    . Cholecalciferol (VITAMIN D3) 5000 units CAPS Take 5,000 Units by mouth daily.    . Coenzyme Q10 (CO Q 10 PO) Take 1 tablet by mouth daily.    . furosemide (LASIX) 80 MG tablet Take 160 mg by mouth 2 (two) times daily.     Marland Kitchen gabapentin (NEURONTIN) 300 MG capsule Take 1 capsule by mouth once daily 90 capsule  0  . heparin 1000 UNIT/ML injection heparin (porcine) 1,000 unit/mL    . hydrALAZINE (APRESOLINE) 100 MG tablet Take 1 tablet (100 mg total) by mouth 3 (three) times daily. 270 tablet 3  . isosorbide dinitrate (ISORDIL) 20 MG tablet Take 1 tablet (20 mg total) by  mouth 2 (two) times daily. 180 tablet 3  . metolazone (ZAROXOLYN) 5 MG tablet Take 5 mg by mouth as needed.    . nitroGLYCERIN (NITROSTAT) 0.4 MG SL tablet Place 1 tablet (0.4 mg total) under the tongue every 5 (five) minutes as needed for chest pain. 25 tablet 0  . NOVOLIN N 100 UNIT/ML injection Inject 30 Units into the skin 2 (two) times daily before a meal.     . NOVOLIN R 100 UNIT/ML injection Inject 15-20 Units into the skin 3 (three) times daily with meals.   5  . ONETOUCH ULTRA test strip USE 1 STRIP TO CHECK GLUCOSE 4 TIMES DAILY 300 each 0  . Potassium Chloride ER 20 MEQ TBCR Take 20 mEq by mouth daily.    . rosuvastatin (CRESTOR) 40 MG tablet Take 1 tablet (40 mg total) by mouth daily. 90 tablet 3  . Transparent Dressings (TEGADERM FIRST AID STYLE) MISC To be used with changing of peritoneal dialysis. Dx z99.2 [place] [phone] each prn  . VITAMIN E PO Take 1 capsule by mouth daily.    Marland Kitchen warfarin (COUMADIN) 5 MG tablet TAKE 1 TABLET BY MOUTH ONCE DAILY AT  6  PM  MONITOR  INR  GOAL  SHOULD  BE  BETWEEN  2-3. 90 tablet 0  . zinc sulfate 220 (50 Zn) MG capsule Take 220 mg by mouth daily.    Marland Kitchen erythromycin ophthalmic ointment Place 1 application into the left eye 4 (four) times daily. 3.5 g 0  . isosorbide mononitrate (ISMO) 20 MG tablet Take 20 mg by mouth 2 (two) times daily.     No facility-administered medications prior to visit.    Allergies  Allergen Reactions  . Hydrocodone Nausea And Vomiting    Other reaction(s): GI Upset (intolerance) Projectile vomiting Projectile vomiting  . Oxycodone Nausea And Vomiting    Other reaction(s): GI Upset (intolerance), Vomiting (intolerance) Projectile vomiting Projectile  vomiting  . Dacarbazine Other (See Comments)    Other reaction(s): Unknown  . Tape Dermatitis, Itching and Rash    Patch used at dialysis    ROS Review of Systems    Objective:    Physical Exam  Constitutional: He is oriented to person, place, and time. He appears well-developed and well-nourished.  HENT:  Head: Normocephalic and atraumatic.  Cardiovascular: Normal rate, regular rhythm and normal heart sounds.  Pulmonary/Chest: Effort normal and breath sounds normal.  Neurological: He is alert and oriented to person, place, and time.  Skin: Skin is warm and dry.  Psychiatric: He has a normal mood and affect. His behavior is normal.    BP (!) 149/63   Pulse 84   Ht 5' 11"  (1.803 m)   Wt 227 lb (103 kg)   SpO2 100%   BMI 31.66 kg/m  Wt Readings from Last 3 Encounters:  03/02/20 227 lb (103 kg)  02/03/20 233 lb (105.7 kg)  01/30/20 233 lb (105.7 kg)     Health Maintenance Due  Topic Date Due  . OPHTHALMOLOGY EXAM  12/06/2019  . COLONOSCOPY  01/26/2020    There are no preventive care reminders to display for this patient.  Lab Results  Component Value Date   TSH 2.391 02/02/2017   Lab Results  Component Value Date   WBC 7.0 10/27/2019   HGB 12.2 (L) 10/27/2019   HCT 38.7 (L) 10/27/2019   MCV 91.9 10/27/2019   PLT 190 10/27/2019   Lab Results  Component Value Date   NA 139  10/27/2019   K 3.0 (L) 10/27/2019   CO2 27 10/27/2019   GLUCOSE 79 10/27/2019   BUN 43 (H) 10/27/2019   CREATININE 3.66 (H) 10/27/2019   BILITOT 1.1 10/27/2019   ALKPHOS 105 10/27/2019   AST 31 10/27/2019   ALT 19 10/27/2019   PROT 6.7 10/27/2019   ALBUMIN 3.1 (L) 10/27/2019   CALCIUM 9.2 10/27/2019   ANIONGAP 13 10/27/2019   GFR 38.90 (L) 03/10/2011   Lab Results  Component Value Date   CHOL 103 08/19/2019   Lab Results  Component Value Date   HDL 37 (L) 08/19/2019   Lab Results  Component Value Date   LDLCALC 48 08/19/2019   Lab Results  Component Value Date    TRIG 97 08/19/2019   Lab Results  Component Value Date   CHOLHDL 2.8 08/19/2019   Lab Results  Component Value Date   HGBA1C 6.7 (A) 02/03/2020      Assessment & Plan:   Problem List Items Addressed This Visit      Cardiovascular and Mediastinum   HYPERTENSION, BENIGN SYSTEMIC    Pressure is up a little bit today.  Normally it looks a little better than this continue to work on low-salt diet we will recheck at next office visit.      Atrial fibrillation ([place]) - Primary    INR is subtherapeutic.  No recent changes to medication. Reports taking it significantly.       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  He still has a few highs and a few lows but I think this is about as close is working to be able to get.  His wife reports that the sliding scale that we switch to has actually been really helpful and she feels like it is a little bit more effective and helps her stay on track.  Just continue to work on good dietary choices.  Unfortunately I do think his overnight dialysis is a big contributor to his blood sugars being a little bit more elevated in the mornings.  We will just continue to follow and I will plan to see him back in about 2 months.      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  He is followed for diabetic retinopathy.        Reminded him he is due for repeat colonoscopy last one was in March 2018 and he is due for 3-year recall with Dr. [name].    No orders of the defined types were placed in this encounter.   Follow-up: Return in about 6 weeks (around 04/13/2020) for Diabetes follow-up.    [name], MD

## 2020-03-03 ENCOUNTER — Encounter: Payer: Self-pay | Admitting: Family Medicine

## 2020-03-03 DIAGNOSIS — N186 End stage renal disease: Secondary | ICD-10-CM | POA: Diagnosis not present

## 2020-03-03 DIAGNOSIS — D631 Anemia in chronic kidney disease: Secondary | ICD-10-CM | POA: Diagnosis not present

## 2020-03-03 DIAGNOSIS — N2581 Secondary hyperparathyroidism of renal origin: Secondary | ICD-10-CM | POA: Diagnosis not present

## 2020-03-03 DIAGNOSIS — Z992 Dependence on renal dialysis: Secondary | ICD-10-CM | POA: Diagnosis not present

## 2020-03-03 DIAGNOSIS — Z4932 Encounter for adequacy testing for peritoneal dialysis: Secondary | ICD-10-CM | POA: Diagnosis not present

## 2020-03-03 NOTE — Assessment & Plan Note (Signed)
Pressure is up a little bit today.  Normally it looks a little better than this continue to work on low-salt diet we will recheck at next office visit.

## 2020-03-03 NOTE — Assessment & Plan Note (Signed)
I think based on his blood sugars overall they look good and his last A1c looked good.  He still has a few highs and a few lows but I think this is about as close is working to be able to get.  His wife reports that the sliding scale that we switch to has actually been really helpful and she feels like it is a little bit more effective and helps her stay on track.  Just continue to work on good dietary choices.  Unfortunately I do think his overnight dialysis is a big contributor to his blood sugars being a little bit more elevated in the mornings.  We will just continue to follow and I will plan to see him back in about 2 months.

## 2020-03-04 ENCOUNTER — Other Ambulatory Visit: Payer: Self-pay | Admitting: Family Medicine

## 2020-03-04 DIAGNOSIS — Z992 Dependence on renal dialysis: Secondary | ICD-10-CM | POA: Diagnosis not present

## 2020-03-04 DIAGNOSIS — M1000 Idiopathic gout, unspecified site: Secondary | ICD-10-CM

## 2020-03-04 DIAGNOSIS — Z4932 Encounter for adequacy testing for peritoneal dialysis: Secondary | ICD-10-CM | POA: Diagnosis not present

## 2020-03-04 DIAGNOSIS — N2581 Secondary hyperparathyroidism of renal origin: Secondary | ICD-10-CM | POA: Diagnosis not present

## 2020-03-04 DIAGNOSIS — M10031 Idiopathic gout, right wrist: Secondary | ICD-10-CM

## 2020-03-04 DIAGNOSIS — D631 Anemia in chronic kidney disease: Secondary | ICD-10-CM | POA: Diagnosis not present

## 2020-03-04 DIAGNOSIS — N186 End stage renal disease: Secondary | ICD-10-CM | POA: Diagnosis not present

## 2020-03-05 DIAGNOSIS — Z992 Dependence on renal dialysis: Secondary | ICD-10-CM | POA: Diagnosis not present

## 2020-03-05 DIAGNOSIS — N2581 Secondary hyperparathyroidism of renal origin: Secondary | ICD-10-CM | POA: Diagnosis not present

## 2020-03-05 DIAGNOSIS — N186 End stage renal disease: Secondary | ICD-10-CM | POA: Diagnosis not present

## 2020-03-05 DIAGNOSIS — D631 Anemia in chronic kidney disease: Secondary | ICD-10-CM | POA: Diagnosis not present

## 2020-03-05 DIAGNOSIS — Z4932 Encounter for adequacy testing for peritoneal dialysis: Secondary | ICD-10-CM | POA: Diagnosis not present

## 2020-03-06 DIAGNOSIS — N186 End stage renal disease: Secondary | ICD-10-CM | POA: Diagnosis not present

## 2020-03-06 DIAGNOSIS — N2581 Secondary hyperparathyroidism of renal origin: Secondary | ICD-10-CM | POA: Diagnosis not present

## 2020-03-06 DIAGNOSIS — Z992 Dependence on renal dialysis: Secondary | ICD-10-CM | POA: Diagnosis not present

## 2020-03-06 DIAGNOSIS — D631 Anemia in chronic kidney disease: Secondary | ICD-10-CM | POA: Diagnosis not present

## 2020-03-06 DIAGNOSIS — Z4932 Encounter for adequacy testing for peritoneal dialysis: Secondary | ICD-10-CM | POA: Diagnosis not present

## 2020-03-07 DIAGNOSIS — N186 End stage renal disease: Secondary | ICD-10-CM | POA: Diagnosis not present

## 2020-03-07 DIAGNOSIS — D631 Anemia in chronic kidney disease: Secondary | ICD-10-CM | POA: Diagnosis not present

## 2020-03-07 DIAGNOSIS — N2581 Secondary hyperparathyroidism of renal origin: Secondary | ICD-10-CM | POA: Diagnosis not present

## 2020-03-07 DIAGNOSIS — Z4932 Encounter for adequacy testing for peritoneal dialysis: Secondary | ICD-10-CM | POA: Diagnosis not present

## 2020-03-07 DIAGNOSIS — Z992 Dependence on renal dialysis: Secondary | ICD-10-CM | POA: Diagnosis not present

## 2020-03-08 ENCOUNTER — Encounter: Payer: Self-pay | Admitting: Family Medicine

## 2020-03-08 ENCOUNTER — Other Ambulatory Visit: Payer: Self-pay

## 2020-03-08 ENCOUNTER — Ambulatory Visit (INDEPENDENT_AMBULATORY_CARE_PROVIDER_SITE_OTHER): Payer: Medicare Other | Admitting: Family Medicine

## 2020-03-08 VITALS — BP 127/72 | HR 74 | Ht 71.0 in | Wt 229.0 lb

## 2020-03-08 DIAGNOSIS — Z4932 Encounter for adequacy testing for peritoneal dialysis: Secondary | ICD-10-CM | POA: Diagnosis not present

## 2020-03-08 DIAGNOSIS — N186 End stage renal disease: Secondary | ICD-10-CM | POA: Diagnosis not present

## 2020-03-08 DIAGNOSIS — D631 Anemia in chronic kidney disease: Secondary | ICD-10-CM | POA: Diagnosis not present

## 2020-03-08 DIAGNOSIS — T148XXA Other injury of unspecified body region, initial encounter: Secondary | ICD-10-CM | POA: Insufficient documentation

## 2020-03-08 DIAGNOSIS — I4891 Unspecified atrial fibrillation: Secondary | ICD-10-CM

## 2020-03-08 DIAGNOSIS — Z992 Dependence on renal dialysis: Secondary | ICD-10-CM | POA: Diagnosis not present

## 2020-03-08 DIAGNOSIS — N2581 Secondary hyperparathyroidism of renal origin: Secondary | ICD-10-CM | POA: Diagnosis not present

## 2020-03-08 LAB — CBC
HCT: 46.8 % (ref 38.5–50.0)
Hemoglobin: 15.2 g/dL (ref 13.2–17.1)
MCH: 29.7 pg (ref 27.0–33.0)
MCHC: 32.5 g/dL (ref 32.0–36.0)
MCV: 91.4 fL (ref 80.0–100.0)
MPV: 11.6 fL (ref 7.5–12.5)
Platelets: 234 10*3/uL (ref 140–400)
RBC: 5.12 10*6/uL (ref 4.20–5.80)
RDW: 14.5 % (ref 11.0–15.0)
WBC: 7.5 10*3/uL (ref 3.8–10.8)

## 2020-03-08 LAB — POCT INR: INR: 1.7 — AB (ref 2.0–3.0)

## 2020-03-08 NOTE — Progress Notes (Signed)
Louis [name] - 74 y.o. male MRN [id number]  Date of birth: 1946-03-23  Subjective Chief Complaint  Patient presents with  . Bleeding/Bruising    HPI Louis [name] is a 74 y.o. male with history of A. Fib and ESRD here today with complaint of bruising and swelling of L hand.  He is anticoagulated with warfarin.  He does not recall any injury to the hand or arm.  He denies pain in hand but it feels tight when trying to make a fist.  He denies increased bruising elsewhere and has not had any blood in stool or dark stools.  INR last week 1.5 and warfarin adjusted.  He has only just started new dosing over the weekend.    ROS:  A comprehensive ROS was completed and negative except as noted per HPI  Allergies  Allergen Reactions  . Hydrocodone Nausea And Vomiting    Other reaction(s): GI Upset (intolerance) Projectile vomiting Projectile vomiting  . Oxycodone Nausea And Vomiting    Other reaction(s): GI Upset (intolerance), Vomiting (intolerance) Projectile vomiting Projectile vomiting  . Dacarbazine Other (See Comments)    Other reaction(s): Unknown  . Tape Dermatitis, Itching and Rash    Patch used at dialysis    Past Medical History:  Diagnosis Date  . Brittle bones    per pt, has soft bones in right foot/wears boot cast!  . CAD (coronary artery disease)   . Cataract    Bil/ surg scheduled for right eye 01/18/17/ left eye 02/08/17  . Charcot ankle, right 2019  . CHF (congestive heart failure) ([place]) 2015  . Diabetes mellitus   . Heart failure, diastolic ([place])   . Hyperlipidemia   . Hypertension   . Macular edema 2014  . OSA on CPAP   . Personal history of colonic polyps - adenomas 01/28/2014  . Renal insufficiency    being examined for dialysis for home dialysis [facility]  . Shortness of breath dyspnea   . Syncope and collapse     Past Surgical History:  Procedure Laterality Date  . CARPAL TUNNEL RELEASE     left hand  . COLONOSCOPY    . INTRAVASCULAR PRESSURE  WIRE/FFR STUDY N/A 12/18/2018   Procedure: INTRAVASCULAR PRESSURE WIRE/FFR STUDY;  Surgeon: Wellington Hampshire, MD;  Location: [facility];  Service: Cardiovascular;  Laterality: N/A;  . IR FLUORO GUIDE CV LINE RIGHT  12/16/2018  . IR US GUIDE VASC ACCESS RIGHT  12/16/2018  . KNEE ARTHROSCOPY Right 09/13/2016   [facility], Dr. Dorna Leitz  . PILONIDAL CYST EXCISION    . RIGHT/LEFT HEART CATH AND CORONARY ANGIOGRAPHY N/A 12/18/2018   Procedure: RIGHT/LEFT HEART CATH AND CORONARY ANGIOGRAPHY;  Surgeon: Wellington Hampshire, MD;  Location: [facility];  Service: Cardiovascular;  Laterality: N/A;    Social History   Socioeconomic History  . Marital status: Married    Spouse name: [name]  . Number of children: 5  . Years of education: 26  . Highest education level: 12th grade  Occupational History  . Occupation: Warehouse    Comment: retired  Tobacco Use  . Smoking status: Former Smoker    Packs/day: 0.50    Years: 20.00    Pack years: 10.00    Types: Cigarettes    Quit date: 03/06/1977    Years since quitting: 43.0  . Smokeless tobacco: Never Used  Substance and Sexual Activity  . Alcohol use: No  . Drug use: No  . Sexual activity: Not  Currently  Other Topics Concern  . Not on file  Social History Narrative   Drinks a cup of coffee daily. Drinks a lot of water daily. Goes out with church members during the week   Social Determinants of Health   Financial Resource Strain:   . Difficulty of Paying Living Expenses:   Food Insecurity:   . Worried About Charity fundraiser in the Last Year:   . Arboriculturist in the Last Year:   Transportation Needs:   . Film/video editor (Medical):   Marland Kitchen Lack of Transportation (Non-Medical):   Physical Activity:   . Days of Exercise per Week:   . Minutes of Exercise per Session:   Stress:   . Feeling of Stress :   Social Connections:   . Frequency of Communication with Friends and Family:   . Frequency of Social  Gatherings with Friends and Family:   . Attends Religious Services:   . Active Member of Clubs or Organizations:   . Attends Archivist Meetings:   Marland Kitchen Marital Status:     Family History  Problem Relation Age of Onset  . Diabetes Mother   . Kidney disease Mother   . Diabetes Father   . Coronary artery disease Father   . Diabetes Brother   . Diabetes Sister   . Prostate cancer Maternal Uncle   . Diabetes Maternal Grandmother   . Cancer Paternal Grandmother        unknown  . Heart attack Paternal Grandfather   . Colon cancer Neg Hx     Health Maintenance  Topic Date Due  . COVID-19 Vaccine (1) Never done  . OPHTHALMOLOGY EXAM  12/06/2019  . COLONOSCOPY  01/26/2020  . INFLUENZA VACCINE  06/20/2020  . HEMOGLOBIN A1C  08/05/2020  . TETANUS/TDAP  02/16/2026  . Hepatitis C Screening  Completed  . PNA vac Low Risk Adult  Completed     ----------------------------------------------------------------------------------------------------------------------------------------------------------------------------------------------------------------- Physical Exam BP 127/72   Pulse 74   Ht 5\' 11"  (1.803 m)   Wt 229 lb (103.9 kg)   BMI 31.94 kg/m   Physical Exam Constitutional:      Appearance: Normal appearance.  HENT:     Head: Normocephalic and atraumatic.  Skin:    Findings: Bruising (Small hematoma along proximal, lateral palm.  Bruising extending to distal fingers.  ) present.  Neurological:     General: No focal deficit present.     Mental Status: He is alert.  Psychiatric:        Mood and Affect: Mood normal.        Behavior: Behavior normal.     ------------------------------------------------------------------------------------------------------------------------------------------------------------------------------------------------------------------- Assessment and Plan  Bruising Small hematoma of palm with bruising extending to fingers. Does not  seem to be expanding as he has not noticed any worsening since initial onset.  INR today 1.7 but has just started new dosing.  He will continue current dosing and recheck in 1 week.   Check CBC today.  I don't think additional coag studies are needed at this time.  He may use ice to palm for the next day or two and then switch to heat.  Discussed using caution with these due to neuropathy.  He will contact the clinic for development of new or worsening symptoms.    No orders of the defined types were placed in this encounter.   Return in about 1 week (around 03/15/2020) for PT/INR recheck.    This visit occurred during the SARS-CoV-2  public health emergency.  Safety protocols were in place, including screening questions prior to the visit, additional usage of staff PPE, and extensive cleaning of exam room while observing appropriate contact time as indicated for disinfecting solutions.

## 2020-03-08 NOTE — Assessment & Plan Note (Signed)
Small hematoma of palm with bruising extending to fingers. Does not seem to be expanding as he has not noticed any worsening since initial onset.  INR today 1.7 but has just started new dosing.  He will continue current dosing and recheck in 1 week.   Check CBC today.  I don't think additional coag studies are needed at this time.  He may use ice to palm for the next day or two and then switch to heat.  Discussed using caution with these due to neuropathy.  He will contact the clinic for development of new or worsening symptoms.

## 2020-03-08 NOTE — Patient Instructions (Addendum)
Nice to meet you today! Continue warfarin at current dose. Have labs completed today.  Return in 1 week for recheck of warfarin.   Hematoma A hematoma is a collection of blood. A hematoma can happen:  Under the skin.  In an organ.  In a body space.  In a joint space.  In other tissues. The blood can thicken (clot) to form a lump that you can see and feel. The lump is often hard and may become sore and tender. The lump can be very small or very big. Most hematomas get better in a few days to weeks. However, some hematomas may be serious and need medical care. What are the causes? This condition is caused by:  An injury.  Blood that leaks under the skin.  Problems from surgeries.  Medical conditions that cause bleeding or bruising. What increases the risk? You are more likely to develop this condition if:  You are an older adult.  You use medicines that thin your blood. What are the signs or symptoms? Symptoms depend on where the hematoma is in your body.  If the hematoma is under the skin, there is: ? A firm lump on the body. ? Pain and tenderness in the area. ? Bruising. The skin above the lump may be blue, dark blue, purple-red, or yellowish.  If the hematoma is deep in the tissues or body spaces, there may be: ? Blood in the stomach. This may cause pain in the belly (abdomen), weakness, passing out (fainting), and shortness of breath. ? Blood in the head. This may cause a headache, weakness, trouble speaking or understanding speech, or passing out. How is this diagnosed? This condition is diagnosed based on:  Your medical history.  A physical exam.  Imaging tests, such as ultrasound or CT scan.  Blood tests. How is this treated? Treatment depends on the cause, size, and location of the hematoma. Treatment may include:  Doing nothing. Many hematomas go away on their own without treatment.  Surgery or close monitoring. This may be needed for large  hematomas or hematomas that affect the body's organs.  Medicines. These may be given if a medical condition caused the hematoma. Follow these instructions at home: Managing pain, stiffness, and swelling   If told, put ice on the area. ? Put ice in a plastic bag. ? Place a towel between your skin and the bag. ? Leave the ice on for 20 minutes, 2-3 times a day for the first two days.  If told, put heat on the affected area after putting ice on the area for two days. Use the heat source that your doctor tells you to use. This could be a moist heat pack or a heating pad. To do this: ? Place a towel between your skin and the heat source. ? Leave the heat on for 20-30 minutes. ? Remove the heat if your skin turns bright red. This is very important if you are unable to feel pain, heat, or cold. You may have a greater risk of getting burned.  Raise (elevate) the affected area above the level of your heart while you are sitting or lying down.  Wrap the affected area with an elastic bandage, if told by your doctor. Do not wrap the bandage too tightly.  If your hematoma is on a leg or foot and is painful, your doctor may give you crutches. Use them as told by your doctor. General instructions  Take over-the-counter and prescription medicines only as told  by your doctor.  Keep all follow-up visits as told by your doctor. This is important. Contact a doctor if:  You have a fever.  The swelling or bruising gets worse.  You start to get more hematomas. Get help right away if:  Your pain gets worse.  Your pain is not getting better with medicine.  Your skin over the hematoma breaks or starts to bleed.  Your hematoma is in your chest or belly and you: ? Pass out. ? Feel weak. ? Become short of breath.  You have a hematoma on your scalp that is caused by a fall or injury, and you: ? Have a headache that gets worse. ? Have trouble speaking or understanding speech. ? Become less alert  or you pass out. Summary  A hematoma is a collection of blood in any part of your body.  Most hematomas get better on their own in a few days to weeks. Some may need medical care.  Follow instructions from your doctor about how to care for your hematoma.  Contact a doctor if the swelling or bruising gets worse, or if you are short of breath. This information is not intended to replace advice given to you by your health care provider. Make sure you discuss any questions you have with your health care provider. Document Revised: 04/11/2018 Document Reviewed: 04/11/2018 Elsevier Patient Education  2020 [employer].

## 2020-03-09 DIAGNOSIS — Z4932 Encounter for adequacy testing for peritoneal dialysis: Secondary | ICD-10-CM | POA: Diagnosis not present

## 2020-03-09 DIAGNOSIS — Z992 Dependence on renal dialysis: Secondary | ICD-10-CM | POA: Diagnosis not present

## 2020-03-09 DIAGNOSIS — N186 End stage renal disease: Secondary | ICD-10-CM | POA: Diagnosis not present

## 2020-03-09 DIAGNOSIS — D631 Anemia in chronic kidney disease: Secondary | ICD-10-CM | POA: Diagnosis not present

## 2020-03-09 DIAGNOSIS — N2581 Secondary hyperparathyroidism of renal origin: Secondary | ICD-10-CM | POA: Diagnosis not present

## 2020-03-10 ENCOUNTER — Other Ambulatory Visit: Payer: Self-pay

## 2020-03-10 DIAGNOSIS — N186 End stage renal disease: Secondary | ICD-10-CM | POA: Diagnosis not present

## 2020-03-10 DIAGNOSIS — Z992 Dependence on renal dialysis: Secondary | ICD-10-CM | POA: Diagnosis not present

## 2020-03-10 DIAGNOSIS — N2581 Secondary hyperparathyroidism of renal origin: Secondary | ICD-10-CM | POA: Diagnosis not present

## 2020-03-10 DIAGNOSIS — M1000 Idiopathic gout, unspecified site: Secondary | ICD-10-CM

## 2020-03-10 DIAGNOSIS — Z4932 Encounter for adequacy testing for peritoneal dialysis: Secondary | ICD-10-CM | POA: Diagnosis not present

## 2020-03-10 DIAGNOSIS — D631 Anemia in chronic kidney disease: Secondary | ICD-10-CM | POA: Diagnosis not present

## 2020-03-10 DIAGNOSIS — M10031 Idiopathic gout, right wrist: Secondary | ICD-10-CM

## 2020-03-10 MED ORDER — ALLOPURINOL 100 MG PO TABS: 100.0000 mg | ORAL_TABLET | Freq: Two times a day (BID) | ORAL | 0 refills | Status: DC

## 2020-03-11 ENCOUNTER — Other Ambulatory Visit: Payer: Self-pay

## 2020-03-11 DIAGNOSIS — D631 Anemia in chronic kidney disease: Secondary | ICD-10-CM | POA: Diagnosis not present

## 2020-03-11 DIAGNOSIS — E1122 Type 2 diabetes mellitus with diabetic chronic kidney disease: Secondary | ICD-10-CM

## 2020-03-11 DIAGNOSIS — Z794 Long term (current) use of insulin: Secondary | ICD-10-CM

## 2020-03-11 DIAGNOSIS — N2581 Secondary hyperparathyroidism of renal origin: Secondary | ICD-10-CM | POA: Diagnosis not present

## 2020-03-11 DIAGNOSIS — N186 End stage renal disease: Secondary | ICD-10-CM | POA: Diagnosis not present

## 2020-03-11 DIAGNOSIS — Z4932 Encounter for adequacy testing for peritoneal dialysis: Secondary | ICD-10-CM | POA: Diagnosis not present

## 2020-03-11 DIAGNOSIS — Z992 Dependence on renal dialysis: Secondary | ICD-10-CM | POA: Diagnosis not present

## 2020-03-11 DIAGNOSIS — E119 Type 2 diabetes mellitus without complications: Secondary | ICD-10-CM

## 2020-03-11 MED ORDER — ONETOUCH ULTRA VI STRP: ORAL_STRIP | 0 refills | Status: DC

## 2020-03-11 MED ORDER — FREESTYLE LIBRE 14 DAY SENSOR MISC: 99 refills | Status: DC

## 2020-03-11 MED ORDER — FREESTYLE LIBRE 14 DAY READER DEVI: 99 refills | Status: DC

## 2020-03-12 ENCOUNTER — Ambulatory Visit: Payer: Medicare Other

## 2020-03-12 DIAGNOSIS — Z992 Dependence on renal dialysis: Secondary | ICD-10-CM | POA: Diagnosis not present

## 2020-03-12 DIAGNOSIS — N186 End stage renal disease: Secondary | ICD-10-CM | POA: Diagnosis not present

## 2020-03-12 DIAGNOSIS — D631 Anemia in chronic kidney disease: Secondary | ICD-10-CM | POA: Diagnosis not present

## 2020-03-12 DIAGNOSIS — N2581 Secondary hyperparathyroidism of renal origin: Secondary | ICD-10-CM | POA: Diagnosis not present

## 2020-03-12 DIAGNOSIS — Z4932 Encounter for adequacy testing for peritoneal dialysis: Secondary | ICD-10-CM | POA: Diagnosis not present

## 2020-03-13 DIAGNOSIS — Z4932 Encounter for adequacy testing for peritoneal dialysis: Secondary | ICD-10-CM | POA: Diagnosis not present

## 2020-03-13 DIAGNOSIS — Z992 Dependence on renal dialysis: Secondary | ICD-10-CM | POA: Diagnosis not present

## 2020-03-13 DIAGNOSIS — N2581 Secondary hyperparathyroidism of renal origin: Secondary | ICD-10-CM | POA: Diagnosis not present

## 2020-03-13 DIAGNOSIS — D631 Anemia in chronic kidney disease: Secondary | ICD-10-CM | POA: Diagnosis not present

## 2020-03-13 DIAGNOSIS — N186 End stage renal disease: Secondary | ICD-10-CM | POA: Diagnosis not present

## 2020-03-14 DIAGNOSIS — D631 Anemia in chronic kidney disease: Secondary | ICD-10-CM | POA: Diagnosis not present

## 2020-03-14 DIAGNOSIS — N2581 Secondary hyperparathyroidism of renal origin: Secondary | ICD-10-CM | POA: Diagnosis not present

## 2020-03-14 DIAGNOSIS — Z4932 Encounter for adequacy testing for peritoneal dialysis: Secondary | ICD-10-CM | POA: Diagnosis not present

## 2020-03-14 DIAGNOSIS — Z992 Dependence on renal dialysis: Secondary | ICD-10-CM | POA: Diagnosis not present

## 2020-03-14 DIAGNOSIS — N186 End stage renal disease: Secondary | ICD-10-CM | POA: Diagnosis not present

## 2020-03-15 ENCOUNTER — Ambulatory Visit (INDEPENDENT_AMBULATORY_CARE_PROVIDER_SITE_OTHER): Payer: Medicare Other | Admitting: Family Medicine

## 2020-03-15 ENCOUNTER — Other Ambulatory Visit: Payer: Self-pay

## 2020-03-15 DIAGNOSIS — N186 End stage renal disease: Secondary | ICD-10-CM | POA: Diagnosis not present

## 2020-03-15 DIAGNOSIS — N2581 Secondary hyperparathyroidism of renal origin: Secondary | ICD-10-CM | POA: Diagnosis not present

## 2020-03-15 DIAGNOSIS — I4891 Unspecified atrial fibrillation: Secondary | ICD-10-CM | POA: Diagnosis not present

## 2020-03-15 DIAGNOSIS — D631 Anemia in chronic kidney disease: Secondary | ICD-10-CM | POA: Diagnosis not present

## 2020-03-15 DIAGNOSIS — Z992 Dependence on renal dialysis: Secondary | ICD-10-CM | POA: Diagnosis not present

## 2020-03-15 DIAGNOSIS — Z4932 Encounter for adequacy testing for peritoneal dialysis: Secondary | ICD-10-CM | POA: Diagnosis not present

## 2020-03-15 LAB — POCT INR: INR: 1.8 — AB (ref 2.0–3.0)

## 2020-03-15 MED ORDER — WARFARIN SODIUM 5 MG PO TABS: ORAL_TABLET | ORAL | 0 refills | Status: DC

## 2020-03-15 NOTE — Progress Notes (Signed)
Increase Coumadin.  See flow sheet for adjustments.

## 2020-03-15 NOTE — Patient Instructions (Signed)
Discussed results with Dr Madilyn Fireman. Patient advised to switch to 5 mg on M, W, Th, Sat and 2.5 mg all other days. Medication list updated. Return in 10 days for re-check.

## 2020-03-16 ENCOUNTER — Telehealth: Payer: Self-pay | Admitting: Family Medicine

## 2020-03-16 DIAGNOSIS — D631 Anemia in chronic kidney disease: Secondary | ICD-10-CM | POA: Diagnosis not present

## 2020-03-16 DIAGNOSIS — Z4932 Encounter for adequacy testing for peritoneal dialysis: Secondary | ICD-10-CM | POA: Diagnosis not present

## 2020-03-16 DIAGNOSIS — Z992 Dependence on renal dialysis: Secondary | ICD-10-CM | POA: Diagnosis not present

## 2020-03-16 DIAGNOSIS — N186 End stage renal disease: Secondary | ICD-10-CM | POA: Diagnosis not present

## 2020-03-16 DIAGNOSIS — N2581 Secondary hyperparathyroidism of renal origin: Secondary | ICD-10-CM | POA: Diagnosis not present

## 2020-03-16 NOTE — Telephone Encounter (Signed)
Received fax for PA on [facility] Sensor sent through cover my meds waiting on determination. - CF

## 2020-03-17 DIAGNOSIS — N2581 Secondary hyperparathyroidism of renal origin: Secondary | ICD-10-CM | POA: Diagnosis not present

## 2020-03-17 DIAGNOSIS — Z992 Dependence on renal dialysis: Secondary | ICD-10-CM | POA: Diagnosis not present

## 2020-03-17 DIAGNOSIS — Z4932 Encounter for adequacy testing for peritoneal dialysis: Secondary | ICD-10-CM | POA: Diagnosis not present

## 2020-03-17 DIAGNOSIS — D631 Anemia in chronic kidney disease: Secondary | ICD-10-CM | POA: Diagnosis not present

## 2020-03-17 DIAGNOSIS — N186 End stage renal disease: Secondary | ICD-10-CM | POA: Diagnosis not present

## 2020-03-18 DIAGNOSIS — Z4932 Encounter for adequacy testing for peritoneal dialysis: Secondary | ICD-10-CM | POA: Diagnosis not present

## 2020-03-18 DIAGNOSIS — E113213 Type 2 diabetes mellitus with mild nonproliferative diabetic retinopathy with macular edema, bilateral: Secondary | ICD-10-CM | POA: Diagnosis not present

## 2020-03-18 DIAGNOSIS — N2581 Secondary hyperparathyroidism of renal origin: Secondary | ICD-10-CM | POA: Diagnosis not present

## 2020-03-18 DIAGNOSIS — Z992 Dependence on renal dialysis: Secondary | ICD-10-CM | POA: Diagnosis not present

## 2020-03-18 DIAGNOSIS — N186 End stage renal disease: Secondary | ICD-10-CM | POA: Diagnosis not present

## 2020-03-18 DIAGNOSIS — Z961 Presence of intraocular lens: Secondary | ICD-10-CM | POA: Diagnosis not present

## 2020-03-18 DIAGNOSIS — D631 Anemia in chronic kidney disease: Secondary | ICD-10-CM | POA: Diagnosis not present

## 2020-03-19 DIAGNOSIS — Z4932 Encounter for adequacy testing for peritoneal dialysis: Secondary | ICD-10-CM | POA: Diagnosis not present

## 2020-03-19 DIAGNOSIS — N186 End stage renal disease: Secondary | ICD-10-CM | POA: Diagnosis not present

## 2020-03-19 DIAGNOSIS — N2581 Secondary hyperparathyroidism of renal origin: Secondary | ICD-10-CM | POA: Diagnosis not present

## 2020-03-19 DIAGNOSIS — Z992 Dependence on renal dialysis: Secondary | ICD-10-CM | POA: Diagnosis not present

## 2020-03-19 DIAGNOSIS — D631 Anemia in chronic kidney disease: Secondary | ICD-10-CM | POA: Diagnosis not present

## 2020-03-20 DIAGNOSIS — D631 Anemia in chronic kidney disease: Secondary | ICD-10-CM | POA: Diagnosis not present

## 2020-03-20 DIAGNOSIS — R17 Unspecified jaundice: Secondary | ICD-10-CM | POA: Diagnosis not present

## 2020-03-20 DIAGNOSIS — E1122 Type 2 diabetes mellitus with diabetic chronic kidney disease: Secondary | ICD-10-CM | POA: Diagnosis not present

## 2020-03-20 DIAGNOSIS — N186 End stage renal disease: Secondary | ICD-10-CM | POA: Diagnosis not present

## 2020-03-20 DIAGNOSIS — Z79899 Other long term (current) drug therapy: Secondary | ICD-10-CM | POA: Diagnosis not present

## 2020-03-20 DIAGNOSIS — N2581 Secondary hyperparathyroidism of renal origin: Secondary | ICD-10-CM | POA: Diagnosis not present

## 2020-03-20 DIAGNOSIS — Z992 Dependence on renal dialysis: Secondary | ICD-10-CM | POA: Diagnosis not present

## 2020-03-21 DIAGNOSIS — N186 End stage renal disease: Secondary | ICD-10-CM | POA: Diagnosis not present

## 2020-03-21 DIAGNOSIS — R17 Unspecified jaundice: Secondary | ICD-10-CM | POA: Diagnosis not present

## 2020-03-21 DIAGNOSIS — D631 Anemia in chronic kidney disease: Secondary | ICD-10-CM | POA: Diagnosis not present

## 2020-03-21 DIAGNOSIS — Z79899 Other long term (current) drug therapy: Secondary | ICD-10-CM | POA: Diagnosis not present

## 2020-03-21 DIAGNOSIS — Z992 Dependence on renal dialysis: Secondary | ICD-10-CM | POA: Diagnosis not present

## 2020-03-21 DIAGNOSIS — N2581 Secondary hyperparathyroidism of renal origin: Secondary | ICD-10-CM | POA: Diagnosis not present

## 2020-03-22 DIAGNOSIS — N186 End stage renal disease: Secondary | ICD-10-CM | POA: Diagnosis not present

## 2020-03-22 DIAGNOSIS — D631 Anemia in chronic kidney disease: Secondary | ICD-10-CM | POA: Diagnosis not present

## 2020-03-22 DIAGNOSIS — R17 Unspecified jaundice: Secondary | ICD-10-CM | POA: Diagnosis not present

## 2020-03-22 DIAGNOSIS — Z79899 Other long term (current) drug therapy: Secondary | ICD-10-CM | POA: Diagnosis not present

## 2020-03-22 DIAGNOSIS — R82998 Other abnormal findings in urine: Secondary | ICD-10-CM | POA: Diagnosis not present

## 2020-03-22 DIAGNOSIS — Z992 Dependence on renal dialysis: Secondary | ICD-10-CM | POA: Diagnosis not present

## 2020-03-22 DIAGNOSIS — N2581 Secondary hyperparathyroidism of renal origin: Secondary | ICD-10-CM | POA: Diagnosis not present

## 2020-03-23 DIAGNOSIS — R17 Unspecified jaundice: Secondary | ICD-10-CM | POA: Diagnosis not present

## 2020-03-23 DIAGNOSIS — N2581 Secondary hyperparathyroidism of renal origin: Secondary | ICD-10-CM | POA: Diagnosis not present

## 2020-03-23 DIAGNOSIS — Z992 Dependence on renal dialysis: Secondary | ICD-10-CM | POA: Diagnosis not present

## 2020-03-23 DIAGNOSIS — N186 End stage renal disease: Secondary | ICD-10-CM | POA: Diagnosis not present

## 2020-03-23 DIAGNOSIS — D631 Anemia in chronic kidney disease: Secondary | ICD-10-CM | POA: Diagnosis not present

## 2020-03-23 DIAGNOSIS — Z79899 Other long term (current) drug therapy: Secondary | ICD-10-CM | POA: Diagnosis not present

## 2020-03-24 DIAGNOSIS — D631 Anemia in chronic kidney disease: Secondary | ICD-10-CM | POA: Diagnosis not present

## 2020-03-24 DIAGNOSIS — R17 Unspecified jaundice: Secondary | ICD-10-CM | POA: Diagnosis not present

## 2020-03-24 DIAGNOSIS — Z79899 Other long term (current) drug therapy: Secondary | ICD-10-CM | POA: Diagnosis not present

## 2020-03-24 DIAGNOSIS — Z992 Dependence on renal dialysis: Secondary | ICD-10-CM | POA: Diagnosis not present

## 2020-03-24 DIAGNOSIS — N186 End stage renal disease: Secondary | ICD-10-CM | POA: Diagnosis not present

## 2020-03-24 DIAGNOSIS — N2581 Secondary hyperparathyroidism of renal origin: Secondary | ICD-10-CM | POA: Diagnosis not present

## 2020-03-25 ENCOUNTER — Ambulatory Visit (INDEPENDENT_AMBULATORY_CARE_PROVIDER_SITE_OTHER): Payer: Medicare Other | Admitting: Family Medicine

## 2020-03-25 ENCOUNTER — Other Ambulatory Visit: Payer: Self-pay

## 2020-03-25 DIAGNOSIS — Z992 Dependence on renal dialysis: Secondary | ICD-10-CM | POA: Diagnosis not present

## 2020-03-25 DIAGNOSIS — I4891 Unspecified atrial fibrillation: Secondary | ICD-10-CM

## 2020-03-25 DIAGNOSIS — Z79899 Other long term (current) drug therapy: Secondary | ICD-10-CM | POA: Diagnosis not present

## 2020-03-25 DIAGNOSIS — R17 Unspecified jaundice: Secondary | ICD-10-CM | POA: Diagnosis not present

## 2020-03-25 DIAGNOSIS — N186 End stage renal disease: Secondary | ICD-10-CM | POA: Diagnosis not present

## 2020-03-25 DIAGNOSIS — N2581 Secondary hyperparathyroidism of renal origin: Secondary | ICD-10-CM | POA: Diagnosis not present

## 2020-03-25 DIAGNOSIS — D631 Anemia in chronic kidney disease: Secondary | ICD-10-CM | POA: Diagnosis not present

## 2020-03-25 LAB — POCT INR: INR: 2.1 (ref 2.0–3.0)

## 2020-03-25 NOTE — Progress Notes (Signed)
Agree with documentation as above.   Kacyn Souder, MD  

## 2020-03-25 NOTE — Progress Notes (Signed)
Pt notified of instructions and transferred to scheduling for 2 week recheck.

## 2020-03-26 DIAGNOSIS — N186 End stage renal disease: Secondary | ICD-10-CM | POA: Diagnosis not present

## 2020-03-26 DIAGNOSIS — Z79899 Other long term (current) drug therapy: Secondary | ICD-10-CM | POA: Diagnosis not present

## 2020-03-26 DIAGNOSIS — N2581 Secondary hyperparathyroidism of renal origin: Secondary | ICD-10-CM | POA: Diagnosis not present

## 2020-03-26 DIAGNOSIS — D631 Anemia in chronic kidney disease: Secondary | ICD-10-CM | POA: Diagnosis not present

## 2020-03-26 DIAGNOSIS — Z992 Dependence on renal dialysis: Secondary | ICD-10-CM | POA: Diagnosis not present

## 2020-03-26 DIAGNOSIS — R17 Unspecified jaundice: Secondary | ICD-10-CM | POA: Diagnosis not present

## 2020-03-27 DIAGNOSIS — Z992 Dependence on renal dialysis: Secondary | ICD-10-CM | POA: Diagnosis not present

## 2020-03-27 DIAGNOSIS — R17 Unspecified jaundice: Secondary | ICD-10-CM | POA: Diagnosis not present

## 2020-03-27 DIAGNOSIS — Z79899 Other long term (current) drug therapy: Secondary | ICD-10-CM | POA: Diagnosis not present

## 2020-03-27 DIAGNOSIS — N2581 Secondary hyperparathyroidism of renal origin: Secondary | ICD-10-CM | POA: Diagnosis not present

## 2020-03-27 DIAGNOSIS — N186 End stage renal disease: Secondary | ICD-10-CM | POA: Diagnosis not present

## 2020-03-27 DIAGNOSIS — D631 Anemia in chronic kidney disease: Secondary | ICD-10-CM | POA: Diagnosis not present

## 2020-03-28 DIAGNOSIS — N186 End stage renal disease: Secondary | ICD-10-CM | POA: Diagnosis not present

## 2020-03-28 DIAGNOSIS — Z992 Dependence on renal dialysis: Secondary | ICD-10-CM | POA: Diagnosis not present

## 2020-03-28 DIAGNOSIS — N2581 Secondary hyperparathyroidism of renal origin: Secondary | ICD-10-CM | POA: Diagnosis not present

## 2020-03-28 DIAGNOSIS — D631 Anemia in chronic kidney disease: Secondary | ICD-10-CM | POA: Diagnosis not present

## 2020-03-28 DIAGNOSIS — R17 Unspecified jaundice: Secondary | ICD-10-CM | POA: Diagnosis not present

## 2020-03-28 DIAGNOSIS — Z79899 Other long term (current) drug therapy: Secondary | ICD-10-CM | POA: Diagnosis not present

## 2020-03-29 DIAGNOSIS — N186 End stage renal disease: Secondary | ICD-10-CM | POA: Diagnosis not present

## 2020-03-29 DIAGNOSIS — N2581 Secondary hyperparathyroidism of renal origin: Secondary | ICD-10-CM | POA: Diagnosis not present

## 2020-03-29 DIAGNOSIS — Z992 Dependence on renal dialysis: Secondary | ICD-10-CM | POA: Diagnosis not present

## 2020-03-29 DIAGNOSIS — D631 Anemia in chronic kidney disease: Secondary | ICD-10-CM | POA: Diagnosis not present

## 2020-03-29 DIAGNOSIS — Z79899 Other long term (current) drug therapy: Secondary | ICD-10-CM | POA: Diagnosis not present

## 2020-03-29 DIAGNOSIS — R17 Unspecified jaundice: Secondary | ICD-10-CM | POA: Diagnosis not present

## 2020-03-30 DIAGNOSIS — Z79899 Other long term (current) drug therapy: Secondary | ICD-10-CM | POA: Diagnosis not present

## 2020-03-30 DIAGNOSIS — N2581 Secondary hyperparathyroidism of renal origin: Secondary | ICD-10-CM | POA: Diagnosis not present

## 2020-03-30 DIAGNOSIS — D631 Anemia in chronic kidney disease: Secondary | ICD-10-CM | POA: Diagnosis not present

## 2020-03-30 DIAGNOSIS — N186 End stage renal disease: Secondary | ICD-10-CM | POA: Diagnosis not present

## 2020-03-30 DIAGNOSIS — R17 Unspecified jaundice: Secondary | ICD-10-CM | POA: Diagnosis not present

## 2020-03-30 DIAGNOSIS — Z992 Dependence on renal dialysis: Secondary | ICD-10-CM | POA: Diagnosis not present

## 2020-03-31 DIAGNOSIS — R17 Unspecified jaundice: Secondary | ICD-10-CM | POA: Diagnosis not present

## 2020-03-31 DIAGNOSIS — Z992 Dependence on renal dialysis: Secondary | ICD-10-CM | POA: Diagnosis not present

## 2020-03-31 DIAGNOSIS — N186 End stage renal disease: Secondary | ICD-10-CM | POA: Diagnosis not present

## 2020-03-31 DIAGNOSIS — Z79899 Other long term (current) drug therapy: Secondary | ICD-10-CM | POA: Diagnosis not present

## 2020-03-31 DIAGNOSIS — N2581 Secondary hyperparathyroidism of renal origin: Secondary | ICD-10-CM | POA: Diagnosis not present

## 2020-03-31 DIAGNOSIS — D631 Anemia in chronic kidney disease: Secondary | ICD-10-CM | POA: Diagnosis not present

## 2020-04-01 DIAGNOSIS — Z992 Dependence on renal dialysis: Secondary | ICD-10-CM | POA: Diagnosis not present

## 2020-04-01 DIAGNOSIS — Z79899 Other long term (current) drug therapy: Secondary | ICD-10-CM | POA: Diagnosis not present

## 2020-04-01 DIAGNOSIS — Z961 Presence of intraocular lens: Secondary | ICD-10-CM | POA: Diagnosis not present

## 2020-04-01 DIAGNOSIS — N186 End stage renal disease: Secondary | ICD-10-CM | POA: Diagnosis not present

## 2020-04-01 DIAGNOSIS — E113213 Type 2 diabetes mellitus with mild nonproliferative diabetic retinopathy with macular edema, bilateral: Secondary | ICD-10-CM | POA: Diagnosis not present

## 2020-04-01 DIAGNOSIS — N2581 Secondary hyperparathyroidism of renal origin: Secondary | ICD-10-CM | POA: Diagnosis not present

## 2020-04-01 DIAGNOSIS — R17 Unspecified jaundice: Secondary | ICD-10-CM | POA: Diagnosis not present

## 2020-04-01 DIAGNOSIS — D631 Anemia in chronic kidney disease: Secondary | ICD-10-CM | POA: Diagnosis not present

## 2020-04-02 DIAGNOSIS — D631 Anemia in chronic kidney disease: Secondary | ICD-10-CM | POA: Diagnosis not present

## 2020-04-02 DIAGNOSIS — Z79899 Other long term (current) drug therapy: Secondary | ICD-10-CM | POA: Diagnosis not present

## 2020-04-02 DIAGNOSIS — N186 End stage renal disease: Secondary | ICD-10-CM | POA: Diagnosis not present

## 2020-04-02 DIAGNOSIS — R17 Unspecified jaundice: Secondary | ICD-10-CM | POA: Diagnosis not present

## 2020-04-02 DIAGNOSIS — N2581 Secondary hyperparathyroidism of renal origin: Secondary | ICD-10-CM | POA: Diagnosis not present

## 2020-04-02 DIAGNOSIS — Z992 Dependence on renal dialysis: Secondary | ICD-10-CM | POA: Diagnosis not present

## 2020-04-03 DIAGNOSIS — Z992 Dependence on renal dialysis: Secondary | ICD-10-CM | POA: Diagnosis not present

## 2020-04-03 DIAGNOSIS — D631 Anemia in chronic kidney disease: Secondary | ICD-10-CM | POA: Diagnosis not present

## 2020-04-03 DIAGNOSIS — N2581 Secondary hyperparathyroidism of renal origin: Secondary | ICD-10-CM | POA: Diagnosis not present

## 2020-04-03 DIAGNOSIS — R17 Unspecified jaundice: Secondary | ICD-10-CM | POA: Diagnosis not present

## 2020-04-03 DIAGNOSIS — N186 End stage renal disease: Secondary | ICD-10-CM | POA: Diagnosis not present

## 2020-04-03 DIAGNOSIS — Z79899 Other long term (current) drug therapy: Secondary | ICD-10-CM | POA: Diagnosis not present

## 2020-04-04 DIAGNOSIS — N186 End stage renal disease: Secondary | ICD-10-CM | POA: Diagnosis not present

## 2020-04-04 DIAGNOSIS — R17 Unspecified jaundice: Secondary | ICD-10-CM | POA: Diagnosis not present

## 2020-04-04 DIAGNOSIS — D631 Anemia in chronic kidney disease: Secondary | ICD-10-CM | POA: Diagnosis not present

## 2020-04-04 DIAGNOSIS — Z79899 Other long term (current) drug therapy: Secondary | ICD-10-CM | POA: Diagnosis not present

## 2020-04-04 DIAGNOSIS — N2581 Secondary hyperparathyroidism of renal origin: Secondary | ICD-10-CM | POA: Diagnosis not present

## 2020-04-04 DIAGNOSIS — Z992 Dependence on renal dialysis: Secondary | ICD-10-CM | POA: Diagnosis not present

## 2020-04-05 DIAGNOSIS — D631 Anemia in chronic kidney disease: Secondary | ICD-10-CM | POA: Diagnosis not present

## 2020-04-05 DIAGNOSIS — Z992 Dependence on renal dialysis: Secondary | ICD-10-CM | POA: Diagnosis not present

## 2020-04-05 DIAGNOSIS — Z79899 Other long term (current) drug therapy: Secondary | ICD-10-CM | POA: Diagnosis not present

## 2020-04-05 DIAGNOSIS — R17 Unspecified jaundice: Secondary | ICD-10-CM | POA: Diagnosis not present

## 2020-04-05 DIAGNOSIS — N2581 Secondary hyperparathyroidism of renal origin: Secondary | ICD-10-CM | POA: Diagnosis not present

## 2020-04-05 DIAGNOSIS — N186 End stage renal disease: Secondary | ICD-10-CM | POA: Diagnosis not present

## 2020-04-06 DIAGNOSIS — D631 Anemia in chronic kidney disease: Secondary | ICD-10-CM | POA: Diagnosis not present

## 2020-04-06 DIAGNOSIS — N186 End stage renal disease: Secondary | ICD-10-CM | POA: Diagnosis not present

## 2020-04-06 DIAGNOSIS — R17 Unspecified jaundice: Secondary | ICD-10-CM | POA: Diagnosis not present

## 2020-04-06 DIAGNOSIS — Z79899 Other long term (current) drug therapy: Secondary | ICD-10-CM | POA: Diagnosis not present

## 2020-04-06 DIAGNOSIS — N2581 Secondary hyperparathyroidism of renal origin: Secondary | ICD-10-CM | POA: Diagnosis not present

## 2020-04-06 DIAGNOSIS — Z992 Dependence on renal dialysis: Secondary | ICD-10-CM | POA: Diagnosis not present

## 2020-04-07 DIAGNOSIS — Z992 Dependence on renal dialysis: Secondary | ICD-10-CM | POA: Diagnosis not present

## 2020-04-07 DIAGNOSIS — N2581 Secondary hyperparathyroidism of renal origin: Secondary | ICD-10-CM | POA: Diagnosis not present

## 2020-04-07 DIAGNOSIS — N186 End stage renal disease: Secondary | ICD-10-CM | POA: Diagnosis not present

## 2020-04-07 DIAGNOSIS — D631 Anemia in chronic kidney disease: Secondary | ICD-10-CM | POA: Diagnosis not present

## 2020-04-07 DIAGNOSIS — Z79899 Other long term (current) drug therapy: Secondary | ICD-10-CM | POA: Diagnosis not present

## 2020-04-07 DIAGNOSIS — R17 Unspecified jaundice: Secondary | ICD-10-CM | POA: Diagnosis not present

## 2020-04-08 DIAGNOSIS — Z79899 Other long term (current) drug therapy: Secondary | ICD-10-CM | POA: Diagnosis not present

## 2020-04-08 DIAGNOSIS — D631 Anemia in chronic kidney disease: Secondary | ICD-10-CM | POA: Diagnosis not present

## 2020-04-08 DIAGNOSIS — Z992 Dependence on renal dialysis: Secondary | ICD-10-CM | POA: Diagnosis not present

## 2020-04-08 DIAGNOSIS — N2581 Secondary hyperparathyroidism of renal origin: Secondary | ICD-10-CM | POA: Diagnosis not present

## 2020-04-08 DIAGNOSIS — N186 End stage renal disease: Secondary | ICD-10-CM | POA: Diagnosis not present

## 2020-04-08 DIAGNOSIS — R17 Unspecified jaundice: Secondary | ICD-10-CM | POA: Diagnosis not present

## 2020-04-09 DIAGNOSIS — D631 Anemia in chronic kidney disease: Secondary | ICD-10-CM | POA: Diagnosis not present

## 2020-04-09 DIAGNOSIS — R17 Unspecified jaundice: Secondary | ICD-10-CM | POA: Diagnosis not present

## 2020-04-09 DIAGNOSIS — N2581 Secondary hyperparathyroidism of renal origin: Secondary | ICD-10-CM | POA: Diagnosis not present

## 2020-04-09 DIAGNOSIS — N186 End stage renal disease: Secondary | ICD-10-CM | POA: Diagnosis not present

## 2020-04-09 DIAGNOSIS — Z992 Dependence on renal dialysis: Secondary | ICD-10-CM | POA: Diagnosis not present

## 2020-04-09 DIAGNOSIS — Z79899 Other long term (current) drug therapy: Secondary | ICD-10-CM | POA: Diagnosis not present

## 2020-04-10 DIAGNOSIS — D631 Anemia in chronic kidney disease: Secondary | ICD-10-CM | POA: Diagnosis not present

## 2020-04-10 DIAGNOSIS — N2581 Secondary hyperparathyroidism of renal origin: Secondary | ICD-10-CM | POA: Diagnosis not present

## 2020-04-10 DIAGNOSIS — N186 End stage renal disease: Secondary | ICD-10-CM | POA: Diagnosis not present

## 2020-04-10 DIAGNOSIS — Z992 Dependence on renal dialysis: Secondary | ICD-10-CM | POA: Diagnosis not present

## 2020-04-10 DIAGNOSIS — Z79899 Other long term (current) drug therapy: Secondary | ICD-10-CM | POA: Diagnosis not present

## 2020-04-10 DIAGNOSIS — R17 Unspecified jaundice: Secondary | ICD-10-CM | POA: Diagnosis not present

## 2020-04-11 DIAGNOSIS — Z79899 Other long term (current) drug therapy: Secondary | ICD-10-CM | POA: Diagnosis not present

## 2020-04-11 DIAGNOSIS — N186 End stage renal disease: Secondary | ICD-10-CM | POA: Diagnosis not present

## 2020-04-11 DIAGNOSIS — N2581 Secondary hyperparathyroidism of renal origin: Secondary | ICD-10-CM | POA: Diagnosis not present

## 2020-04-11 DIAGNOSIS — R17 Unspecified jaundice: Secondary | ICD-10-CM | POA: Diagnosis not present

## 2020-04-11 DIAGNOSIS — Z992 Dependence on renal dialysis: Secondary | ICD-10-CM | POA: Diagnosis not present

## 2020-04-11 DIAGNOSIS — D631 Anemia in chronic kidney disease: Secondary | ICD-10-CM | POA: Diagnosis not present

## 2020-04-12 DIAGNOSIS — D631 Anemia in chronic kidney disease: Secondary | ICD-10-CM | POA: Diagnosis not present

## 2020-04-12 DIAGNOSIS — N186 End stage renal disease: Secondary | ICD-10-CM | POA: Diagnosis not present

## 2020-04-12 DIAGNOSIS — Z992 Dependence on renal dialysis: Secondary | ICD-10-CM | POA: Diagnosis not present

## 2020-04-12 DIAGNOSIS — R17 Unspecified jaundice: Secondary | ICD-10-CM | POA: Diagnosis not present

## 2020-04-12 DIAGNOSIS — N2581 Secondary hyperparathyroidism of renal origin: Secondary | ICD-10-CM | POA: Diagnosis not present

## 2020-04-12 DIAGNOSIS — Z79899 Other long term (current) drug therapy: Secondary | ICD-10-CM | POA: Diagnosis not present

## 2020-04-13 ENCOUNTER — Ambulatory Visit (INDEPENDENT_AMBULATORY_CARE_PROVIDER_SITE_OTHER): Payer: Medicare Other | Admitting: Family Medicine

## 2020-04-13 ENCOUNTER — Encounter: Payer: Self-pay | Admitting: Family Medicine

## 2020-04-13 ENCOUNTER — Ambulatory Visit: Payer: Self-pay | Admitting: Family Medicine

## 2020-04-13 VITALS — BP 127/51 | HR 81 | Ht 71.0 in | Wt 228.0 lb

## 2020-04-13 DIAGNOSIS — R17 Unspecified jaundice: Secondary | ICD-10-CM | POA: Diagnosis not present

## 2020-04-13 DIAGNOSIS — I4891 Unspecified atrial fibrillation: Secondary | ICD-10-CM

## 2020-04-13 DIAGNOSIS — D631 Anemia in chronic kidney disease: Secondary | ICD-10-CM | POA: Diagnosis not present

## 2020-04-13 DIAGNOSIS — N2581 Secondary hyperparathyroidism of renal origin: Secondary | ICD-10-CM | POA: Diagnosis not present

## 2020-04-13 DIAGNOSIS — Z79899 Other long term (current) drug therapy: Secondary | ICD-10-CM | POA: Diagnosis not present

## 2020-04-13 DIAGNOSIS — E11610 Type 2 diabetes mellitus with diabetic neuropathic arthropathy: Secondary | ICD-10-CM

## 2020-04-13 DIAGNOSIS — N186 End stage renal disease: Secondary | ICD-10-CM | POA: Diagnosis not present

## 2020-04-13 DIAGNOSIS — I2510 Atherosclerotic heart disease of native coronary artery without angina pectoris: Secondary | ICD-10-CM | POA: Diagnosis not present

## 2020-04-13 DIAGNOSIS — S61219A Laceration without foreign body of unspecified finger without damage to nail, initial encounter: Secondary | ICD-10-CM

## 2020-04-13 DIAGNOSIS — Z992 Dependence on renal dialysis: Secondary | ICD-10-CM | POA: Diagnosis not present

## 2020-04-13 LAB — POCT INR: INR: 3.1 — AB (ref 2.0–3.0)

## 2020-04-13 NOTE — Assessment & Plan Note (Signed)
See anticoag flowsheet

## 2020-04-13 NOTE — Patient Instructions (Signed)
You are due for colonoscopy based on a report.  Let us know if you would like to get that scheduled.

## 2020-04-13 NOTE — Progress Notes (Signed)
Established Patient Office Visit  Subjective:  Patient ID: Louis [name], male    DOB: July 30, 1946  Age: 74 y.o. MRN: [id number]  CC:  Chief Complaint  Patient presents with  . Diabetes  . Atrial Fibrillation    HPI Louis [name] presents for 6 week check up for diabetes. Brought oin home glucose log. Checking suagar QID, before meals and at bedtime before starting peritoneal dialysis.  Sugar are more consistant since we adjusted his sliding scale. Did have a few lows.    Has several lesions on his fingertips that are cracked and not healing. Says he wears gloves but bumps them and it causes an injury taht he can't feel because of his neuropathy.   Afib- due for coumadin check.  Last INR was 2.2. now 3.1. taking 72m 4 days per week and 2.5 mg 3 days per week.    Past Medical History:  Diagnosis Date  . Brittle bones    per pt, has soft bones in right foot/wears boot cast!  . CAD (coronary artery disease)   . Cataract    Bil/ surg scheduled for right eye 01/18/17/ left eye 02/08/17  . Charcot ankle, right 2019  . CH[place]ive heart [place]) [place]  . Diabetes mellitus   . Heart failure, diastolic [place]   . Hyperlipidemia   . Hypertension   . Macular edema 2014  . OSA on CPAP   . Personal history of colonic polyps - adenomas 01/28/2014  . Renal insufficiency    being examined for dialysis for home dialysis [facility]  . Shortness of breath dyspnea   . Syncope and collapse     Past Surgical History:  Procedure Laterality Date  . CARPAL TUNNEL RELEASE     left hand  . COLONOSCOPY    . INTRAVASCULAR PRESSURE WIRE/FFR STUDY N/A 12/18/2018   Procedure: INTRAVASCULAR PRESSURE WIRE/FFR STUDY;  Surgeon: AWellington Hampshire MD;  [facility];  Service: Cardiovascular;  Laterality: N/A;  . IR FLUORO GUIDE CV LINE RIGHT  12/16/2018  . IR [place] VASC ACCESS RIGHT  12/16/2018  . KNEE ARTHROSCOPY Right 09/13/2016   [facility], Dr. JDorna Leitz .  PILONIDAL CYST EXCISION    . RIGHT/LEFT HEART CATH AND CORONARY ANGIOGRAPHY N/A 12/18/2018   Procedure: RIGHT/LEFT HEART CATH AND CORONARY ANGIOGRAPHY;  Surgeon: AWellington Hampshire MD;  [facility];  Service: Cardiovascular;  Laterality: N/A;    Family History  Problem Relation Age of Onset  . Diabetes Mother   . Kidney disease Mother   . Diabetes Father   . Coronary artery disease Father   . Diabetes Brother   . Diabetes Sister   . Prostate cancer Maternal Uncle   . Diabetes Maternal Grandmother   . Cancer Paternal Grandmother        unknown  . Heart attack Paternal Grandfather   . Colon cancer Neg Hx     Social History   Socioeconomic History  . Marital status: Married    Spouse name: GBaker Janus . Number of children: 5  . Years of education: 149 . Highest education level: 12th grade  Occupational History  . Occupation: Warehouse    Comment: retired  Tobacco Use  . Smoking status: Former Smoker    Packs/day: 0.50    Years: 20.00    Pack years: 10.00    Types: Cigarettes    Quit date: 03/06/1977    Years since quitting: 43.1  .  Smokeless tobacco: Never Used  Substance and Sexual Activity  . Alcohol use: No  . Drug use: No  . Sexual activity: Not Currently  Other Topics Concern  . Not on file  Social History Narrative   Drinks a cup of coffee daily. Drinks a lot of water daily. Goes out with church members during the week   Social Determinants of Health   Financial Resource Strain:   . Difficulty of Paying Living Expenses:   Food Insecurity:   . Worried About Charity fundraiser in the Last Year:   . Arboriculturist in the Last Year:   Transportation Needs:   . Film/video editor (Medical):   Marland Kitchen Lack of Transportation (Non-Medical):   Physical Activity:   . Days of Exercise per Week:   . Minutes of Exercise per Session:   Stress:   . Feeling of Stress :   Social Connections:   . Frequency of Communication with Friends and Family:   .  Frequency of Social Gatherings with Friends and Family:   . Attends Religious Services:   . Active Member of Clubs or Organizations:   . Attends Archivist Meetings:   Marland Kitchen Marital Status:   Intimate Partner Violence:   . Fear of Current or Ex-Partner:   . Emotionally Abused:   Marland Kitchen Physically Abused:   . Sexually Abused:     Outpatient Medications Prior to Visit  Medication Sig Dispense Refill  . allopurinol (ZYLOPRIM) 100 MG tablet Take 1-1.5 tablets (100-150 mg total) by mouth 2 (two) times daily. 270 tablet 0  . AMBULATORY NON FORMULARY MEDICATION Continuous positive airway pressure (CPAP) device: Auto titrate minimum 4 cm H20 to maximum of 20 cm H2O with pressure. Please provide all supplemental supplies as needed. Fax to: [phone] 1 Units 1  . AMBULATORY NON FORMULARY MEDICATION Shower chair - Dx: M25.561, M54.12, M10.00 - Fax to - [phone] 1 each 0  . AMBULATORY NON FORMULARY MEDICATION Rollator walker with seat. Dx: M25.561, M54.12, M10.00 - Fax to - [phone] 1 each 0  . amitriptyline (ELAVIL) 25 MG tablet Take 1-2 tablets (25-50 mg total) by mouth at bedtime. (Patient taking differently: Take 25-50 mg by mouth at bedtime. Taking 12.5 mg at bedtime) 60 tablet 1  . amLODipine (NORVASC) 10 MG tablet Take 1 tablet (10 mg total) by mouth daily. Pt is taking this in the morning 90 tablet 0  . B-D INS SYR ULTRAFINE 1CC/31G 31G X 5/16" 1 ML MISC     . blood glucose meter kit and supplies Use to check blood sugars daily E11.65    . Cholecalciferol (VITAMIN D3) 5000 units CAPS Take 5,000 Units by mouth daily.    . Coenzyme Q10 (CO Q 10 PO) Take 1 tablet by mouth daily.    . Continuous Blood Gluc Receiver (FREESTYLE LIBRE 14 DAY READER) DEVI Check blood sugar 4 times daily. 1 each prn  . Continuous Blood Gluc Sensor (FREESTYLE LIBRE 14 DAY SENSOR) MISC Check blood sugar 4 times daily. 1 each prn  . furosemide (LASIX) 80 MG tablet Take 160 mg by mouth 2 (two) times daily.      Marland Kitchen gabapentin (NEURONTIN) 300 MG capsule Take 1 capsule (300 mg total) by mouth daily. 30 capsule 0  . heparin 1000 UNIT/ML injection heparin (porcine) 1,000 unit/mL    . hydrALAZINE (APRESOLINE) 100 MG tablet Take 1 tablet (100 mg total) by mouth 3 (three) times daily. 270 tablet 3  . isosorbide dinitrate (  ISORDIL) 20 MG tablet Take 1 tablet (20 mg total) by mouth 2 (two) times daily. 180 tablet 3  . metolazone (ZAROXOLYN) 5 MG tablet Take 5 mg by mouth as needed.    . nitroGLYCERIN (NITROSTAT) 0.4 MG SL tablet Place 1 tablet (0.4 mg total) under the tongue every 5 (five) minutes as needed for chest pain. 25 tablet 0  . NOVOLIN N 100 UNIT/ML injection Inject 30 Units into the skin 2 (two) times daily before a meal.     . NOVOLIN R 100 UNIT/ML injection Inject 15-20 Units into the skin 3 (three) times daily with meals.   5  . ONETOUCH ULTRA test strip USE 1 STRIP TO CHECK GLUCOSE 3 TIMES DAILY 300 each 0  . Potassium Chloride ER 20 MEQ TBCR Take 20 mEq by mouth daily.    . rosuvastatin (CRESTOR) 40 MG tablet Take 1 tablet (40 mg total) by mouth daily. 90 tablet 3  . Transparent Dressings (TEGADERM FIRST AID STYLE) MISC To be used with changing of peritoneal dialysis. Dx z99.2 D[place] [phone] 50 each prn  . VITAMIN E PO Take 1 capsule by mouth daily.    Marland Kitchen warfarin (COUMADIN) 5 MG tablet TAKE 1 TABLET BY MOUTH ONCE DAILY AT  6  PM  MONITOR  INR  GOAL  SHOULD  BE  BETWEEN  2-3. 5 mg M,W,Th,Sat and 2.5 mg all other days 90 tablet 0  . zinc sulfate 220 (50 Zn) MG capsule Take 220 mg by mouth daily.     No facility-administered medications prior to visit.    Allergies  Allergen Reactions  . Hydrocodone Nausea And Vomiting    Other reaction(s): GI Upset (intolerance) Projectile vomiting Projectile vomiting  . Oxycodone Nausea And Vomiting    Other reaction(s): GI Upset (intolerance), Vomiting (intolerance) Projectile vomiting Projectile vomiting  . Dacarbazine Other (See Comments)     Other reaction(s): Unknown  . Tape Dermatitis, Itching and Rash    Patch used at dialysis    ROS Review of Systems    Objective:    Physical Exam  Constitutional: He is oriented to person, place, and time. He appears well-developed and well-nourished.  HENT:  Head: Normocephalic and atraumatic.  Cardiovascular: Normal rate, regular rhythm and normal heart sounds.  Pulmonary/Chest: Effort normal and breath sounds normal.  Neurological: He is alert and oriented to person, place, and time.  Skin: Skin is warm and dry.  Psychiatric: He has a normal mood and affect. His behavior is normal.    There were no vitals taken for this visit. Wt Readings from Last 3 Encounters:  03/25/20 225 lb (102.1 kg)  03/08/20 229 lb (103.9 kg)  03/02/20 227 lb (103 kg)     Health Maintenance Due  Topic Date Due  . OPHTHALMOLOGY EXAM  12/06/2019  . COLONOSCOPY  01/26/2020    There are no preventive care reminders to display for this patient.  Lab Results  Component Value Date   TSH 2.391 02/02/2017   Lab Results  Component Value Date   WBC 7.5 03/08/2020   HGB 15.2 03/08/2020   HCT 46.8 03/08/2020   MCV 91.4 03/08/2020   PLT 234 03/08/2020   Lab Results  Component Value Date   NA 139 10/27/2019   K 3.0 (L) 10/27/2019   CO2 27 10/27/2019   GLUCOSE 79 10/27/2019   BUN 43 (H) 10/27/2019   CREATININE 3.66 (H) 10/27/2019   BILITOT 1.1 10/27/2019   ALKPHOS 105 10/27/2019   AST 31  10/27/2019   ALT 19 10/27/2019   PROT 6.7 10/27/2019   ALBUMIN 3.1 (L) 10/27/2019   CALCIUM 9.2 10/27/2019   ANIONGAP 13 10/27/2019   GFR 38.90 (L) 03/10/2011   Lab Results  Component Value Date   CHOL 103 08/19/2019   Lab Results  Component Value Date   HDL 37 (L) 08/19/2019   Lab Results  Component Value Date   LDLCALC 48 08/19/2019   Lab Results  Component Value Date   TRIG 97 08/19/2019   Lab Results  Component Value Date   CHOLHDL 2.8 08/19/2019   Lab Results  Component Value  Date   HGBA1C 6.7 (A) 02/03/2020      Assessment & Plan:   Problem List Items Addressed This Visit      Cardiovascular and Mediastinum   Atrial fibrillation ([place])    See anticoag flowsheet.       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  Couple of low but overall sugar look much better will continue with current regimen.        Other Visit Diagnoses    Laceration of finger of right hand without foreign body without damage to nail, unspecified finger, initial encounter         Laceration - area cleaned.  Dermabond applied to wounds on 3 fingers on his right hand. Tolerated well. Avoid immersion in water. Shower ok. Will likely last about 1 week.   No orders of the defined types were placed in this encounter.   Follow-up: Return in about 6 weeks (around 05/25/2020) for Diabetes follow-up.    [name], MD

## 2020-04-13 NOTE — Assessment & Plan Note (Signed)
Sugar are still up and down.  Couple of low but overall sugar look much better will continue with current regimen.

## 2020-04-14 ENCOUNTER — Other Ambulatory Visit: Payer: Self-pay | Admitting: Cardiology

## 2020-04-14 DIAGNOSIS — Z992 Dependence on renal dialysis: Secondary | ICD-10-CM | POA: Diagnosis not present

## 2020-04-14 DIAGNOSIS — I2510 Atherosclerotic heart disease of native coronary artery without angina pectoris: Secondary | ICD-10-CM

## 2020-04-14 DIAGNOSIS — I10 Essential (primary) hypertension: Secondary | ICD-10-CM

## 2020-04-14 DIAGNOSIS — D631 Anemia in chronic kidney disease: Secondary | ICD-10-CM | POA: Diagnosis not present

## 2020-04-14 DIAGNOSIS — N2581 Secondary hyperparathyroidism of renal origin: Secondary | ICD-10-CM | POA: Diagnosis not present

## 2020-04-14 DIAGNOSIS — R17 Unspecified jaundice: Secondary | ICD-10-CM | POA: Diagnosis not present

## 2020-04-14 DIAGNOSIS — R001 Bradycardia, unspecified: Secondary | ICD-10-CM

## 2020-04-14 DIAGNOSIS — Z79899 Other long term (current) drug therapy: Secondary | ICD-10-CM | POA: Diagnosis not present

## 2020-04-14 DIAGNOSIS — N186 End stage renal disease: Secondary | ICD-10-CM | POA: Diagnosis not present

## 2020-04-14 DIAGNOSIS — E785 Hyperlipidemia, unspecified: Secondary | ICD-10-CM

## 2020-04-15 DIAGNOSIS — Z79899 Other long term (current) drug therapy: Secondary | ICD-10-CM | POA: Diagnosis not present

## 2020-04-15 DIAGNOSIS — R17 Unspecified jaundice: Secondary | ICD-10-CM | POA: Diagnosis not present

## 2020-04-15 DIAGNOSIS — N186 End stage renal disease: Secondary | ICD-10-CM | POA: Diagnosis not present

## 2020-04-15 DIAGNOSIS — N2581 Secondary hyperparathyroidism of renal origin: Secondary | ICD-10-CM | POA: Diagnosis not present

## 2020-04-15 DIAGNOSIS — Z992 Dependence on renal dialysis: Secondary | ICD-10-CM | POA: Diagnosis not present

## 2020-04-15 DIAGNOSIS — D631 Anemia in chronic kidney disease: Secondary | ICD-10-CM | POA: Diagnosis not present

## 2020-04-15 NOTE — Telephone Encounter (Signed)
Checked on PA through cover my meds and because this is a medical supply it has to be billed under medicare part B not his pharmacy insurance. - CF

## 2020-04-16 ENCOUNTER — Other Ambulatory Visit: Payer: Self-pay | Admitting: Family Medicine

## 2020-04-16 ENCOUNTER — Encounter: Payer: Self-pay | Admitting: Family Medicine

## 2020-04-16 DIAGNOSIS — R17 Unspecified jaundice: Secondary | ICD-10-CM | POA: Diagnosis not present

## 2020-04-16 DIAGNOSIS — I4891 Unspecified atrial fibrillation: Secondary | ICD-10-CM

## 2020-04-16 DIAGNOSIS — I10 Essential (primary) hypertension: Secondary | ICD-10-CM

## 2020-04-16 DIAGNOSIS — R001 Bradycardia, unspecified: Secondary | ICD-10-CM

## 2020-04-16 DIAGNOSIS — I2510 Atherosclerotic heart disease of native coronary artery without angina pectoris: Secondary | ICD-10-CM

## 2020-04-16 DIAGNOSIS — Z79899 Other long term (current) drug therapy: Secondary | ICD-10-CM | POA: Diagnosis not present

## 2020-04-16 DIAGNOSIS — Z992 Dependence on renal dialysis: Secondary | ICD-10-CM | POA: Diagnosis not present

## 2020-04-16 DIAGNOSIS — E785 Hyperlipidemia, unspecified: Secondary | ICD-10-CM

## 2020-04-16 DIAGNOSIS — D631 Anemia in chronic kidney disease: Secondary | ICD-10-CM | POA: Diagnosis not present

## 2020-04-16 DIAGNOSIS — N2581 Secondary hyperparathyroidism of renal origin: Secondary | ICD-10-CM | POA: Diagnosis not present

## 2020-04-16 DIAGNOSIS — N186 End stage renal disease: Secondary | ICD-10-CM | POA: Diagnosis not present

## 2020-04-16 MED ORDER — HYDRALAZINE HCL 100 MG PO TABS: 100.0000 mg | ORAL_TABLET | Freq: Three times a day (TID) | ORAL | 3 refills | Status: DC

## 2020-04-17 DIAGNOSIS — N186 End stage renal disease: Secondary | ICD-10-CM | POA: Diagnosis not present

## 2020-04-17 DIAGNOSIS — Z79899 Other long term (current) drug therapy: Secondary | ICD-10-CM | POA: Diagnosis not present

## 2020-04-17 DIAGNOSIS — Z992 Dependence on renal dialysis: Secondary | ICD-10-CM | POA: Diagnosis not present

## 2020-04-17 DIAGNOSIS — N2581 Secondary hyperparathyroidism of renal origin: Secondary | ICD-10-CM | POA: Diagnosis not present

## 2020-04-17 DIAGNOSIS — D631 Anemia in chronic kidney disease: Secondary | ICD-10-CM | POA: Diagnosis not present

## 2020-04-17 DIAGNOSIS — R17 Unspecified jaundice: Secondary | ICD-10-CM | POA: Diagnosis not present

## 2020-04-18 DIAGNOSIS — N2581 Secondary hyperparathyroidism of renal origin: Secondary | ICD-10-CM | POA: Diagnosis not present

## 2020-04-18 DIAGNOSIS — Z79899 Other long term (current) drug therapy: Secondary | ICD-10-CM | POA: Diagnosis not present

## 2020-04-18 DIAGNOSIS — N186 End stage renal disease: Secondary | ICD-10-CM | POA: Diagnosis not present

## 2020-04-18 DIAGNOSIS — R17 Unspecified jaundice: Secondary | ICD-10-CM | POA: Diagnosis not present

## 2020-04-18 DIAGNOSIS — D631 Anemia in chronic kidney disease: Secondary | ICD-10-CM | POA: Diagnosis not present

## 2020-04-18 DIAGNOSIS — Z992 Dependence on renal dialysis: Secondary | ICD-10-CM | POA: Diagnosis not present

## 2020-04-19 ENCOUNTER — Other Ambulatory Visit: Payer: Self-pay | Admitting: Family Medicine

## 2020-04-19 DIAGNOSIS — R17 Unspecified jaundice: Secondary | ICD-10-CM | POA: Diagnosis not present

## 2020-04-19 DIAGNOSIS — N2581 Secondary hyperparathyroidism of renal origin: Secondary | ICD-10-CM | POA: Diagnosis not present

## 2020-04-19 DIAGNOSIS — D631 Anemia in chronic kidney disease: Secondary | ICD-10-CM | POA: Diagnosis not present

## 2020-04-19 DIAGNOSIS — N186 End stage renal disease: Secondary | ICD-10-CM | POA: Diagnosis not present

## 2020-04-19 DIAGNOSIS — Z992 Dependence on renal dialysis: Secondary | ICD-10-CM | POA: Diagnosis not present

## 2020-04-19 DIAGNOSIS — Z79899 Other long term (current) drug therapy: Secondary | ICD-10-CM | POA: Diagnosis not present

## 2020-04-21 DIAGNOSIS — R82998 Other abnormal findings in urine: Secondary | ICD-10-CM | POA: Diagnosis not present

## 2020-04-22 DIAGNOSIS — E113213 Type 2 diabetes mellitus with mild nonproliferative diabetic retinopathy with macular edema, bilateral: Secondary | ICD-10-CM | POA: Diagnosis not present

## 2020-04-27 ENCOUNTER — Encounter: Payer: Self-pay | Admitting: Internal Medicine

## 2020-04-27 ENCOUNTER — Ambulatory Visit (INDEPENDENT_AMBULATORY_CARE_PROVIDER_SITE_OTHER): Payer: Medicare Other | Admitting: Internal Medicine

## 2020-04-27 VITALS — BP 108/60 | HR 75 | Ht 66.5 in | Wt 232.0 lb

## 2020-04-27 DIAGNOSIS — I2510 Atherosclerotic heart disease of native coronary artery without angina pectoris: Secondary | ICD-10-CM

## 2020-04-27 DIAGNOSIS — Z86010 Personal history of colonic polyps: Secondary | ICD-10-CM

## 2020-04-27 DIAGNOSIS — N186 End stage renal disease: Secondary | ICD-10-CM | POA: Diagnosis not present

## 2020-04-27 DIAGNOSIS — Z992 Dependence on renal dialysis: Secondary | ICD-10-CM

## 2020-04-27 NOTE — Progress Notes (Signed)
Louis [name] 74 y.o. 11-30-1945 [id number]  Assessment & Plan:   Encounter Diagnoses  Name Primary?   Hx of adenomatous colonic polyps Yes   ESRD on dialysis [facility])     The patient is not interested in pursuing surveillance colonoscopy due to his age and comorbidities.  That is a reasonable choice.  Thus we will investigate signs and symptoms as appropriate.  He is aware of the possibility of the development of colon cancer but we both think that is unlikely to occur in the time frame of his expected life span given all of his comorbidities.  I appreciate the opportunity to care for this patient. CC: [name], MD    Subjective:   Chief Complaint: History of colon polyps  HPI [name] is a 74 year old white man with end-stage renal disease on chronic peritoneal dialysis, diabetes, diastolic heart failure with a history of colon polyps in 2015 and 2018.  He also takes warfarin therapy for paroxysmal atrial fibrillation.  In 2020 he had a telehealth visit with Alonza Bogus, PA-C, he has had some rectal bleeding which was self-limited.  None since.  He does have a history of hemorrhoids.  He is taking MiraLAX daily and that allows for regular bowel movements.  Polyp history is 5 adenomas max 8 mm 2015 and 3 adenomas 10 mm max 2018. Allergies  Allergen Reactions   Hydrocodone Nausea And Vomiting    Other reaction(s): GI Upset (intolerance) Projectile vomiting Projectile vomiting   Oxycodone Nausea And Vomiting    Other reaction(s): GI Upset (intolerance), Vomiting (intolerance) Projectile vomiting Projectile vomiting   Dacarbazine Other (See Comments)    Other reaction(s): Unknown   Tape Dermatitis, Itching and Rash    Patch used at dialysis   Current Meds  Medication Sig   allopurinol (ZYLOPRIM) 100 MG tablet Take 1-1.5 tablets (100-150 mg total) by mouth 2 (two) times daily.   AMBULATORY NON FORMULARY MEDICATION Continuous positive airway  pressure (CPAP) device: Auto titrate minimum 4 cm H20 to maximum of 20 cm H2O with pressure. Please provide all supplemental supplies as needed. Fax to: [phone]   AMBULATORY NON Craig chair - Dx: M25.561, M54.12, M10.00 - Fax to - [phone]   AMBULATORY NON FORMULARY MEDICATION Rollator walker with seat. Dx: M25.561, M54.12, M10.00 - Fax to - [phone]   amitriptyline (ELAVIL) 25 MG tablet Take 1-2 tablets (25-50 mg total) by mouth at bedtime. (Patient taking differently: Take 12.5 mg by mouth at bedtime. Taking 12.5 mg at bedtime)   amLODipine (NORVASC) 10 MG tablet Take 1 tablet (10 mg total) by mouth daily. Pt is taking this in the morning   B-D INS SYR ULTRAFINE 1CC/31G 31G X 5/16" 1 ML MISC    blood glucose meter kit and supplies Use to check blood sugars daily E11.65   Cholecalciferol (VITAMIN D3) 5000 units CAPS Take 5,000 Units by mouth daily.   Coenzyme Q10 (CO Q 10 PO) Take 1 tablet by mouth daily.   Continuous Blood Gluc Receiver (FREESTYLE LIBRE 14 DAY READER) DEVI Check blood sugar 4 times daily.   Continuous Blood Gluc Sensor (FREESTYLE LIBRE 14 DAY SENSOR) MISC Check blood sugar 4 times daily.   furosemide (LASIX) 80 MG tablet Take 160 mg by mouth 2 (two) times daily.    gabapentin (NEURONTIN) 300 MG capsule Take 1 capsule by mouth once daily   heparin 1000 UNIT/ML injection heparin (porcine) 1,000 unit/mL   hydrALAZINE (APRESOLINE) 100 MG tablet Take 1  tablet (100 mg total) by mouth 3 (three) times daily.   isosorbide dinitrate (ISORDIL) 20 MG tablet Take 1 tablet (20 mg total) by mouth 2 (two) times daily.   metolazone (ZAROXOLYN) 5 MG tablet Take 5 mg by mouth as needed.   nitroGLYCERIN (NITROSTAT) 0.4 MG SL tablet Place 1 tablet (0.4 mg total) under the tongue every 5 (five) minutes as needed for chest pain.   NOVOLIN N 100 UNIT/ML injection Inject 32 Units into the skin 2 (two) times daily before a meal.    ONETOUCH ULTRA test  strip USE 1 STRIP TO CHECK GLUCOSE 3 TIMES DAILY   Potassium Chloride ER 20 MEQ TBCR Take 40 mEq by mouth daily.    rosuvastatin (CRESTOR) 40 MG tablet Take 1 tablet (40 mg total) by mouth daily.   Transparent Dressings (TEGADERM FIRST AID STYLE) MISC To be used with changing of peritoneal dialysis. Dx z99.2 Hulan [name] [phone]   VITAMIN E PO Take 1 capsule by mouth daily.   warfarin (COUMADIN) 5 MG tablet TAKE 1 TABLET BY MOUTH ONCE DAILY AT 6 PM MONITOR INR GOAL SHOULD BE BETWEEN 2-3. (Patient taking differently: Take 5 mg by mouth daily. Take 74m Monday, Wednesday, and Saturday.  Take 2.581mon Tuesday, Thursday, Friday, and sunday)   zinc sulfate 220 (50 Zn) MG capsule Take 220 mg by mouth daily.   Past Medical History:  Diagnosis Date   Brittle bones    per pt, has soft bones in right foot/wears boot cast!   CAD (coronary artery disease)    Cataract    Bil/ surg scheduled for right eye 01/18/17/ left eye 02/08/17   Charcot ankle, right 2019   [id number]   Diabetes mellitus    Heart failure, diastolic ([facility])    Hyperlipidemia    Hypertension    Macular edema 2014   OSA on CPAP    Paroxysmal atrial fibrillation ([facility])    Personal history of colonic polyps - adenomas 01/28/2014   Renal insufficiency    being examined for dialysis for home dialysis [facility]   Shortness of breath dyspnea    Syncope and collapse    Past Surgical History:  Procedure Laterality Date   CARPAL TUNNEL RELEASE     left hand   COLONOSCOPY     INTRAVASCULAR PRESSURE WIRE/FFR STUDY N/A 12/18/2018   Procedure: INTRAVASCULAR PRESSURE WIRE/FFR STUDY;  Surgeon: ArWellington HampshireMD;  [facility];  Service: Cardiovascular;  Laterality: N/A;   IR FLUORO GUIDE CV LINE RIGHT  12/16/2018   IR [place] VASC ACCESS RIGHT  12/16/2018   KNEE ARTHROSCOPY Right 09/13/2016   [facility], Dr. JoDorna Leitz PILONIDAL CYST EXCISION      RIGHT/LEFT HEART CATH AND CORONARY ANGIOGRAPHY N/A 12/18/2018   Procedure: RIGHT/LEFT HEART CATH AND CORONARY ANGIOGRAPHY;  Surgeon: ArWellington HampshireMD;  [facility];  Service: Cardiovascular;  Laterality: N/A;   Social History   Social History Narrative   Drinks a cup of coffee daily. Drinks a lot of water daily. Goes out with church members during the week   family history includes Cancer in his paternal grandmother; Coronary artery disease in his father; Diabetes in his brother, father, maternal grandmother, mother, and sister; Heart attack in his paternal grandfather; Kidney disease in his mother; Prostate cancer in his maternal uncle.   Review of Systems Recently turned down for kidney transplant due to poor heart function.  Objective:  Physical Exam BP 108/60    Pulse 75    Ht 5' 6.5" (1.689 m)    Wt 232 lb (105.2 kg)    BMI 36.88 kg/m  Elderly chronically ill white man no acute distress has a brace on his foot from Charcot joint ambulates with a cane. 21 minutes total time

## 2020-04-27 NOTE — Patient Instructions (Signed)
You have decided not to pursue routine colonoscopy testing.  If you have change in bowels, bleeding or any other problems with intestines or stomach please come back to see me.  I appreciate the opportunity to care for you. Louis Mayer, MD, [facility]

## 2020-04-28 ENCOUNTER — Ambulatory Visit (INDEPENDENT_AMBULATORY_CARE_PROVIDER_SITE_OTHER): Payer: Medicare Other | Admitting: Physician Assistant

## 2020-04-28 VITALS — BP 131/61 | HR 79 | Temp 98.0°F | Ht 71.0 in | Wt 232.1 lb

## 2020-04-28 DIAGNOSIS — I4891 Unspecified atrial fibrillation: Secondary | ICD-10-CM

## 2020-04-28 LAB — POCT INR: INR: 2.7 (ref 2.0–3.0)

## 2020-04-28 NOTE — Progress Notes (Signed)
Patient presents today as a nurse visit for an INR check.  Patient states he  is taking her medication as prescribed without any side effects/adverse effects. Medication and allergy list reviewed with patient and the pharmacy has been verified.   Missed doses: No Extra doses: No Diet changes: No Bleeding gums: No Nose bleeds: No Antibiotic use: No Hospitalization: No Blood in urine: No Blood in stool: No Dental procedures: No Medication changes since last visit: No Bruising: No Abnormal bleeding:No HA: No Dizziness/lightheadedness: No Weakness/Fatigue: No ShOB: No  CP: No Palps: No   INR Goal: 2-3 Current regimen: warfarin 5 mg, 1 tablet on Mon, Wed, Sat and 0.5 tablet on Tues, Thurs, Fri, and Sunday.  INR result: 2.7   Spoke with Iran Planas, PA-C who instructed me to advise the patient: continue current dose as currently prescribed, taking 5 mg 3 days a week and 2.5 mg 4 days a week. Recheck INR in 4 weeks.   Patient aware. Patient verbalized understanding. Patient instructed to stop at check out to schedule their next INR check appointment to be done in 4 weeks.

## 2020-04-28 NOTE — Progress Notes (Signed)
All labs are normal. 

## 2020-05-17 DIAGNOSIS — E46 Unspecified protein-calorie malnutrition: Secondary | ICD-10-CM | POA: Insufficient documentation

## 2020-05-18 NOTE — Progress Notes (Signed)
HPI: FU CAD, diastolic CHF and PAF. 2/09OBSJ were normal. Carotid dopplers 4/18 showed less than50% bilaterally. Monitor4/19showed recurrent atrial fibrillation. Cardiac catheterization January 2020 showed 60% left main, 50% circumflex, occluded LAD, 50% first diagonal, 60% mid and distal right coronary artery and 60% posterior lateral branch. FFR of left main 0.95. Pulmonary capillary wedge pressure 24 with PA pressure 68/23. Medical therapy recommended. Patient is now on dialysis. Echocardiogram June 2020 performed at [facility] showed normal LV function, moderate left ventricular hypertrophy, moderate left atrial enlargement and mild mitral regurgitation. Right heart catheterization [place] July 2020 showed right atrial pressure of 11, pulmonary artery pressure of 58/20, pulmonary capillary wedge pressure of 24. Patient turned down for transplant and now on dialysis.  Since lastseen patient denies dyspnea, chest pain, palpitations, syncope or bleeding.  Current Outpatient Medications  Medication Sig Dispense Refill  . allopurinol (ZYLOPRIM) 100 MG tablet Take 1-1.5 tablets (100-150 mg total) by mouth 2 (two) times daily. 270 tablet 0  . AMBULATORY NON FORMULARY MEDICATION Continuous positive airway pressure (CPAP) device: Auto titrate minimum 4 cm H20 to maximum of 20 cm H2O with pressure. Please provide all supplemental supplies as needed. Fax to: [phone] 1 Units 1  . AMBULATORY NON FORMULARY MEDICATION Shower chair - Dx: M25.561, M54.12, M10.00 - Fax to - [phone] 1 each 0  . AMBULATORY NON FORMULARY MEDICATION Rollator walker with seat. Dx: M25.561, M54.12, M10.00 - Fax to - [phone] 1 each 0  . amitriptyline (ELAVIL) 25 MG tablet Take 1-2 tablets (25-50 mg total) by mouth at bedtime. (Patient taking differently: Take 12.5 mg by mouth at bedtime. Taking 12.5 mg at bedtime) 60 tablet 1  . amLODipine (NORVASC) 10 MG tablet Take 1 tablet (10 mg total) by mouth daily. Pt  is taking this in the morning 90 tablet 0  . B-D INS SYR ULTRAFINE 1CC/31G 31G X 5/16" 1 ML MISC     . blood glucose meter kit and supplies Use to check blood sugars daily E11.65    . Cholecalciferol (VITAMIN D3) 5000 units CAPS Take 5,000 Units by mouth daily.    . Coenzyme Q10 (CO Q 10 PO) Take 1 tablet by mouth daily.    . Continuous Blood Gluc Receiver (FREESTYLE LIBRE 14 DAY READER) DEVI Check blood sugar 4 times daily. 1 each prn  . Continuous Blood Gluc Sensor (FREESTYLE LIBRE 14 DAY SENSOR) MISC Check blood sugar 4 times daily. 1 each prn  . furosemide (LASIX) 80 MG tablet Take 160 mg by mouth 2 (two) times daily.     Marland Kitchen gabapentin (NEURONTIN) 300 MG capsule Take 1 capsule by mouth once daily 90 capsule 1  . gentamicin cream (GARAMYCIN) 0.1 % APPLY TO EXIT SITE DAILY    . heparin 1000 UNIT/ML injection 500 Units.     . hydrALAZINE (APRESOLINE) 100 MG tablet Take 1 tablet (100 mg total) by mouth 3 (three) times daily. 270 tablet 3  . isosorbide dinitrate (ISORDIL) 20 MG tablet Take 1 tablet (20 mg total) by mouth 2 (two) times daily. 180 tablet 3  . metolazone (ZAROXOLYN) 5 MG tablet Take 5 mg by mouth as needed.    . nitroGLYCERIN (NITROSTAT) 0.4 MG SL tablet Place 1 tablet (0.4 mg total) under the tongue every 5 (five) minutes as needed for chest pain. 25 tablet 0  . NOVOLIN N 100 UNIT/ML injection Inject 32 Units into the skin 2 (two) times daily before a meal.     .  NOVOLIN R 100 UNIT/ML injection Inject 5-20 Units into the skin 3 (three) times daily with meals.   5  . ONETOUCH ULTRA test strip USE 1 STRIP TO CHECK GLUCOSE 3 TIMES DAILY 300 each 0  . Potassium Chloride ER 20 MEQ TBCR Take 40 mEq by mouth daily.     . rosuvastatin (CRESTOR) 40 MG tablet Take 1 tablet (40 mg total) by mouth daily. 90 tablet 3  . Transparent Dressings (TEGADERM FIRST AID STYLE) MISC To be used with changing of peritoneal dialysis. Dx z99.2 Dove [phone] 50 each prn  . VITAMIN E PO Take 1 capsule by  mouth daily.    Marland Kitchen warfarin (COUMADIN) 5 MG tablet TAKE 1 TABLET BY MOUTH ONCE DAILY AT 6 PM MONITOR INR GOAL SHOULD BE BETWEEN 2-3. (Patient taking differently: Take 5 mg by mouth daily. Take 55m Monday, Wednesday, and Saturday.  Take 2.519mon Tuesday, Thursday, Friday, and sunday) 90 tablet 0  . zinc sulfate 220 (50 Zn) MG capsule Take 220 mg by mouth daily.     No current facility-administered medications for this visit.     Past Medical History:  Diagnosis Date  . Brittle bones    per pt, has soft bones in right foot/wears boot cast!  . CAD (coronary artery disease)   . Cataract    Bil/ surg scheduled for right eye 01/18/17/ left eye 02/08/17  . Charcot ankle, right 2019  . CH[facility] (congestive heart failure) [facility]  . Diabetes mellitus   . Heart failure, diastolic ([facility]  . Hyperlipidemia   . Hypertension   . Macular edema 2014  . OSA on CPAP   . Paroxysmal atrial fibrillation (HCC)   . Personal history of colonic polyps - adenomas 01/28/2014  . Renal insufficiency    being examined for dialysis for home dialysis [facility]  . Shortness of breath dyspnea   . Syncope and collapse     Past Surgical History:  Procedure Laterality Date  . CARPAL TUNNEL RELEASE     left hand  . COLONOSCOPY    . INTRAVASCULAR PRESSURE WIRE/FFR STUDY N/A 12/18/2018   Procedure: INTRAVASCULAR PRESSURE WIRE/FFR STUDY;  Surgeon: ArWellington HampshireMD;  [facility];  Service: Cardiovascular;  Laterality: N/A;  . IR FLUORO GUIDE CV LINE RIGHT  12/16/2018  . IR [place] VASC ACCESS RIGHT  12/16/2018  . KNEE ARTHROSCOPY Right 09/13/2016   [facility], Dr. JoDorna Leitz. PILONIDAL CYST EXCISION    . RIGHT/LEFT HEART CATH AND CORONARY ANGIOGRAPHY N/A 12/18/2018   Procedure: RIGHT/LEFT HEART CATH AND CORONARY ANGIOGRAPHY;  Surgeon: ArWellington HampshireMD;  [facility];  Service: Cardiovascular;  Laterality: N/A;    Social History   Socioeconomic History  .  Marital status: Married    Spouse name: GaBaker Janus. Number of children: 5  . Years of education: 1285. Highest education level: 12th grade  Occupational History  . Occupation: Warehouse    Comment: retired  Tobacco Use  . Smoking status: Former Smoker    Packs/day: 0.50    Years: 20.00    Pack years: 10.00    Types: Cigarettes    Quit date: 03/06/1977    Years since quitting: 43.2  . Smokeless tobacco: Never Used  Vaping Use  . Vaping Use: Never used  Substance and Sexual Activity  . Alcohol use: No  . Drug use: No  . Sexual activity: Not Currently  Other Topics Concern  .  Not on file  Social History Narrative   Drinks a cup of coffee daily. Drinks a lot of water daily. Goes out with church members during the week   Social Determinants of Health   Financial Resource Strain:   . Difficulty of Paying Living Expenses:   Food Insecurity:   . Worried About Charity fundraiser in the Last Year:   . Arboriculturist in the Last Year:   Transportation Needs:   . Film/video editor (Medical):   Marland Kitchen Lack of Transportation (Non-Medical):   Physical Activity:   . Days of Exercise per Week:   . Minutes of Exercise per Session:   Stress:   . Feeling of Stress :   Social Connections:   . Frequency of Communication with Friends and Family:   . Frequency of Social Gatherings with Friends and Family:   . Attends Religious Services:   . Active Member of Clubs or Organizations:   . Attends Archivist Meetings:   Marland Kitchen Marital Status:   Intimate Partner Violence:   . Fear of Current or Ex-Partner:   . Emotionally Abused:   Marland Kitchen Physically Abused:   . Sexually Abused:     Family History  Problem Relation Age of Onset  . Diabetes Mother   . Kidney disease Mother   . Diabetes Father   . Coronary artery disease Father   . Diabetes Brother   . Diabetes Sister   . Prostate cancer Maternal Uncle   . Diabetes Maternal Grandmother   . Cancer Paternal Grandmother        unknown    . Heart attack Paternal Grandfather   . Colon cancer Neg Hx     ROS: no fevers or chills, productive cough, hemoptysis, dysphasia, odynophagia, melena, hematochezia, dysuria, hematuria, rash, seizure activity, orthopnea, PND, pedal edema, claudication. Remaining systems are negative.  Physical Exam: Well-developed well-nourished in no acute distress.  Skin is warm and dry.  HEENT is normal.  Neck is supple.  Chest is clear to auscultation with normal expansion.  Cardiovascular exam is irregular Abdominal exam nontender or distended.  Dialysis catheter in place no masses palpated. Extremities show 1+ edema. neuro grossly intact  ECG-atrial fibrillation at a rate of 78, nonspecific ST changes.  Personally reviewed  A/P  1 coronary artery disease-patient denies chest pain.  Continue medical therapy.  We will continue Crestor.  No aspirin given need for anticoagulation.  2 paroxysmal atrial fibrillation-beta-blocker discontinued previously secondary to bradycardia.  Continue Coumadin with goal INR 2-3.  3 end-stage renal disease-now dialysis dependent.  Continue per nephrology.  4 hyperlipidemia-continue Crestor.  Check lipids and liver.  5 hypertension-patient's blood pressure is controlled.  Continue present medications.  6 chronic diastolic dysfunction/heart failure-volume is managed by dialysis and diuretics.  [name], MD

## 2020-05-20 DIAGNOSIS — D631 Anemia in chronic kidney disease: Secondary | ICD-10-CM | POA: Diagnosis not present

## 2020-05-20 DIAGNOSIS — E1122 Type 2 diabetes mellitus with diabetic chronic kidney disease: Secondary | ICD-10-CM | POA: Diagnosis not present

## 2020-05-20 DIAGNOSIS — Z4932 Encounter for adequacy testing for peritoneal dialysis: Secondary | ICD-10-CM | POA: Diagnosis not present

## 2020-05-20 DIAGNOSIS — N2581 Secondary hyperparathyroidism of renal origin: Secondary | ICD-10-CM | POA: Diagnosis not present

## 2020-05-20 DIAGNOSIS — N186 End stage renal disease: Secondary | ICD-10-CM | POA: Diagnosis not present

## 2020-05-20 DIAGNOSIS — Z992 Dependence on renal dialysis: Secondary | ICD-10-CM | POA: Diagnosis not present

## 2020-05-21 ENCOUNTER — Telehealth: Payer: Self-pay

## 2020-05-21 DIAGNOSIS — N2581 Secondary hyperparathyroidism of renal origin: Secondary | ICD-10-CM | POA: Diagnosis not present

## 2020-05-21 DIAGNOSIS — E1122 Type 2 diabetes mellitus with diabetic chronic kidney disease: Secondary | ICD-10-CM

## 2020-05-21 DIAGNOSIS — M5412 Radiculopathy, cervical region: Secondary | ICD-10-CM

## 2020-05-21 DIAGNOSIS — Z992 Dependence on renal dialysis: Secondary | ICD-10-CM | POA: Diagnosis not present

## 2020-05-21 DIAGNOSIS — D631 Anemia in chronic kidney disease: Secondary | ICD-10-CM | POA: Diagnosis not present

## 2020-05-21 DIAGNOSIS — E11610 Type 2 diabetes mellitus with diabetic neuropathic arthropathy: Secondary | ICD-10-CM

## 2020-05-21 DIAGNOSIS — Z4932 Encounter for adequacy testing for peritoneal dialysis: Secondary | ICD-10-CM | POA: Diagnosis not present

## 2020-05-21 DIAGNOSIS — N186 End stage renal disease: Secondary | ICD-10-CM | POA: Diagnosis not present

## 2020-05-21 NOTE — Telephone Encounter (Signed)
Louis [name] wanted to know if home health could come in to evaluate his needs. He is unable to do basic care for himself. Such as picking up small objects, putting on socks and holding a spoon.    He has an appointment Tuesday afternoon.

## 2020-05-22 DIAGNOSIS — Z4932 Encounter for adequacy testing for peritoneal dialysis: Secondary | ICD-10-CM | POA: Diagnosis not present

## 2020-05-22 DIAGNOSIS — N2581 Secondary hyperparathyroidism of renal origin: Secondary | ICD-10-CM | POA: Diagnosis not present

## 2020-05-22 DIAGNOSIS — D631 Anemia in chronic kidney disease: Secondary | ICD-10-CM | POA: Diagnosis not present

## 2020-05-22 DIAGNOSIS — N186 End stage renal disease: Secondary | ICD-10-CM | POA: Diagnosis not present

## 2020-05-22 DIAGNOSIS — Z992 Dependence on renal dialysis: Secondary | ICD-10-CM | POA: Diagnosis not present

## 2020-05-23 DIAGNOSIS — N2581 Secondary hyperparathyroidism of renal origin: Secondary | ICD-10-CM | POA: Diagnosis not present

## 2020-05-23 DIAGNOSIS — D631 Anemia in chronic kidney disease: Secondary | ICD-10-CM | POA: Diagnosis not present

## 2020-05-23 DIAGNOSIS — Z992 Dependence on renal dialysis: Secondary | ICD-10-CM | POA: Diagnosis not present

## 2020-05-23 DIAGNOSIS — N186 End stage renal disease: Secondary | ICD-10-CM | POA: Diagnosis not present

## 2020-05-23 DIAGNOSIS — Z4932 Encounter for adequacy testing for peritoneal dialysis: Secondary | ICD-10-CM | POA: Diagnosis not present

## 2020-05-24 DIAGNOSIS — Z992 Dependence on renal dialysis: Secondary | ICD-10-CM | POA: Diagnosis not present

## 2020-05-24 DIAGNOSIS — E1122 Type 2 diabetes mellitus with diabetic chronic kidney disease: Secondary | ICD-10-CM | POA: Diagnosis not present

## 2020-05-24 DIAGNOSIS — D631 Anemia in chronic kidney disease: Secondary | ICD-10-CM | POA: Diagnosis not present

## 2020-05-24 DIAGNOSIS — R82998 Other abnormal findings in urine: Secondary | ICD-10-CM | POA: Diagnosis not present

## 2020-05-24 DIAGNOSIS — Z4932 Encounter for adequacy testing for peritoneal dialysis: Secondary | ICD-10-CM | POA: Diagnosis not present

## 2020-05-24 DIAGNOSIS — N186 End stage renal disease: Secondary | ICD-10-CM | POA: Diagnosis not present

## 2020-05-24 DIAGNOSIS — N2581 Secondary hyperparathyroidism of renal origin: Secondary | ICD-10-CM | POA: Diagnosis not present

## 2020-05-25 ENCOUNTER — Ambulatory Visit: Payer: Self-pay | Admitting: *Deleted

## 2020-05-25 ENCOUNTER — Ambulatory Visit (INDEPENDENT_AMBULATORY_CARE_PROVIDER_SITE_OTHER): Payer: Medicare Other | Admitting: Family Medicine

## 2020-05-25 ENCOUNTER — Ambulatory Visit: Payer: Self-pay | Admitting: Family Medicine

## 2020-05-25 ENCOUNTER — Encounter: Payer: Self-pay | Admitting: Family Medicine

## 2020-05-25 VITALS — BP 131/66 | HR 84 | Ht 71.0 in | Wt 231.0 lb

## 2020-05-25 DIAGNOSIS — I10 Essential (primary) hypertension: Secondary | ICD-10-CM

## 2020-05-25 DIAGNOSIS — N185 Chronic kidney disease, stage 5: Secondary | ICD-10-CM

## 2020-05-25 DIAGNOSIS — N186 End stage renal disease: Secondary | ICD-10-CM | POA: Diagnosis not present

## 2020-05-25 DIAGNOSIS — D631 Anemia in chronic kidney disease: Secondary | ICD-10-CM | POA: Diagnosis not present

## 2020-05-25 DIAGNOSIS — I4811 Longstanding persistent atrial fibrillation: Secondary | ICD-10-CM

## 2020-05-25 DIAGNOSIS — E1122 Type 2 diabetes mellitus with diabetic chronic kidney disease: Secondary | ICD-10-CM

## 2020-05-25 DIAGNOSIS — I2510 Atherosclerotic heart disease of native coronary artery without angina pectoris: Secondary | ICD-10-CM | POA: Diagnosis not present

## 2020-05-25 DIAGNOSIS — I4891 Unspecified atrial fibrillation: Secondary | ICD-10-CM

## 2020-05-25 DIAGNOSIS — E11610 Type 2 diabetes mellitus with diabetic neuropathic arthropathy: Secondary | ICD-10-CM | POA: Diagnosis not present

## 2020-05-25 DIAGNOSIS — N2581 Secondary hyperparathyroidism of renal origin: Secondary | ICD-10-CM | POA: Diagnosis not present

## 2020-05-25 DIAGNOSIS — Z992 Dependence on renal dialysis: Secondary | ICD-10-CM

## 2020-05-25 DIAGNOSIS — Z4932 Encounter for adequacy testing for peritoneal dialysis: Secondary | ICD-10-CM | POA: Diagnosis not present

## 2020-05-25 DIAGNOSIS — E1142 Type 2 diabetes mellitus with diabetic polyneuropathy: Secondary | ICD-10-CM

## 2020-05-25 LAB — POCT INR: INR: 2.1 (ref 2.0–3.0)

## 2020-05-25 LAB — POCT GLYCOSYLATED HEMOGLOBIN (HGB A1C): Hemoglobin A1C: 7 % — AB (ref 4.0–5.6)

## 2020-05-25 NOTE — Assessment & Plan Note (Signed)
Rate controlled.  See anticoagulation flowsheet for adjustment to Coumadin.  INR at goal.  No changes made today.  Recheck in 3 weeks.

## 2020-05-25 NOTE — Assessment & Plan Note (Signed)
A1c 7.0 today which looks great it is up a little bit from previous but he is having fewer hypoglycemic episodes which I think is very reassuring.  He still has a pretty significantly elevated glucose first thing in the morning.  We can have him move his Novolin in to suppertime instead of bedtime and see if this makes a difference and see if he also has fewer hypoglycemic events around lunchtime.

## 2020-05-25 NOTE — Patient Instructions (Signed)
Move Novolin N to supper time.

## 2020-05-25 NOTE — Progress Notes (Signed)
Pt here for INR check no missed doses, diet changes,bruising,bleeding,CP,SOB.  Taking 5 mg M,W,and Saturday and 2.5 mg all other days.

## 2020-05-25 NOTE — Progress Notes (Signed)
ERROR

## 2020-05-25 NOTE — Progress Notes (Addendum)
Established Patient Office Visit  Subjective:  Patient ID: Louis [name], male    DOB: 03/08/1946  Age: 74 y.o. MRN: [id number]  CC:  Chief Complaint  Patient presents with  . Diabetes  . Coagulation Disorder    HPI Louis [name] presents for   Diabetes - no hypoglycemic events. No wounds or sores that are not healing well. No increased thirst or urination. Checking glucose at home 4-5 times a day.  Uses short actinginsulin TID and long acting QD. See scanned log. Taking medications as prescribed without any side effects.  No getting high blood sugars first thing in the morning after peritoneal dialysis.  We have made multiple adjustments in fine-tuning to his regimen.  He is having fewer hypoglycemic events but if he does have a low blood sugar it is usually at lunchtime.  We had reduced his morning Novolin N dose to 32 units and he takes 58 units in the evening.  See copy of scanned diabetic diabetes log.   Past Medical History:  Diagnosis Date  . Brittle bones    per pt, has soft bones in right foot/wears boot cast!  . CAD (coronary artery disease)   . Cataract    Bil/ surg scheduled for right eye 01/18/17/ left eye 02/08/17  . Charcot ankle, right 2019  . CHF (congestive heart failure) ([place]) 2015  . Diabetes mellitus   . Heart failure, diastolic ([place])   . Hyperlipidemia   . Hypertension   . Macular edema 2014  . OSA on CPAP   . Paroxysmal atrial fibrillation (HCC)   . Personal history of colonic polyps - adenomas 01/28/2014  . Renal insufficiency    being examined for dialysis for home dialysis [facility]  . Shortness of breath dyspnea   . Syncope and collapse     Past Surgical History:  Procedure Laterality Date  . CARPAL TUNNEL RELEASE     left hand  . COLONOSCOPY    . INTRAVASCULAR PRESSURE WIRE/FFR STUDY N/A 12/18/2018   Procedure: INTRAVASCULAR PRESSURE WIRE/FFR STUDY;  Surgeon: Wellington Hampshire, MD;  Location: [place] [facility];  Service: Cardiovascular;   Laterality: N/A;  . IR FLUORO GUIDE CV LINE RIGHT  12/16/2018  . IR US GUIDE VASC ACCESS RIGHT  12/16/2018  . KNEE ARTHROSCOPY Right 09/13/2016   [facility], Dr. Dorna Leitz  . PILONIDAL CYST EXCISION    . RIGHT/LEFT HEART CATH AND CORONARY ANGIOGRAPHY N/A 12/18/2018   Procedure: RIGHT/LEFT HEART CATH AND CORONARY ANGIOGRAPHY;  Surgeon: Wellington Hampshire, MD;  Location: [place] [facility];  Service: Cardiovascular;  Laterality: N/A;    Family History  Problem Relation Age of Onset  . Diabetes Mother   . Kidney disease Mother   . Diabetes Father   . Coronary artery disease Father   . Diabetes Brother   . Diabetes Sister   . Prostate cancer Maternal Uncle   . Diabetes Maternal Grandmother   . Cancer Paternal Grandmother        unknown  . Heart attack Paternal Grandfather   . Colon cancer Neg Hx     Social History   Socioeconomic History  . Marital status: Married    Spouse name: [name]  . Number of children: 5  . Years of education: 58  . Highest education level: 12th grade  Occupational History  . Occupation: Warehouse    Comment: retired  Tobacco Use  . Smoking status: Former Smoker    Packs/day: 0.50  Years: 20.00    Pack years: 10.00    Types: Cigarettes    Quit date: 03/06/1977    Years since quitting: 43.3  . Smokeless tobacco: Never Used  Vaping Use  . Vaping Use: Never used  Substance and Sexual Activity  . Alcohol use: No  . Drug use: No  . Sexual activity: Not Currently  Other Topics Concern  . Not on file  Social History Narrative   Drinks a cup of coffee daily. Drinks a lot of water daily. Goes out with church members during the week   Social Determinants of Health   Financial Resource Strain:   . Difficulty of Paying Living Expenses: Not on file  Food Insecurity:   . Worried About Charity fundraiser in the Last Year: Not on file  . Ran Out of Food in the Last Year: Not on file  Transportation Needs:   . Lack of Transportation  (Medical): Not on file  . Lack of Transportation (Non-Medical): Not on file  Physical Activity:   . Days of Exercise per Week: Not on file  . Minutes of Exercise per Session: Not on file  Stress:   . Feeling of Stress : Not on file  Social Connections:   . Frequency of Communication with Friends and Family: Not on file  . Frequency of Social Gatherings with Friends and Family: Not on file  . Attends Religious Services: Not on file  . Active Member of Clubs or Organizations: Not on file  . Attends Archivist Meetings: Not on file  . Marital Status: Not on file  Intimate Partner Violence:   . Fear of Current or Ex-Partner: Not on file  . Emotionally Abused: Not on file  . Physically Abused: Not on file  . Sexually Abused: Not on file    Outpatient Medications Prior to Visit  Medication Sig Dispense Refill  . AMBULATORY NON FORMULARY MEDICATION Continuous positive airway pressure (CPAP) device: Auto titrate minimum 4 cm H20 to maximum of 20 cm H2O with pressure. Please provide all supplemental supplies as needed. Fax to: [phone] 1 Units 1  . AMBULATORY NON FORMULARY MEDICATION Shower chair - Dx: M25.561, M54.12, M10.00 - Fax to - [phone] 1 each 0  . AMBULATORY NON FORMULARY MEDICATION Rollator walker with seat. Dx: M25.561, M54.12, M10.00 - Fax to - [phone] 1 each 0  . amitriptyline (ELAVIL) 25 MG tablet Take 1-2 tablets (25-50 mg total) by mouth at bedtime. (Patient taking differently: Take 12.5 mg by mouth at bedtime. Taking 12.5 mg at bedtime) 60 tablet 1  . amLODipine (NORVASC) 10 MG tablet Take 1 tablet (10 mg total) by mouth daily. Pt is taking this in the morning 90 tablet 0  . B-D INS SYR ULTRAFINE 1CC/31G 31G X 5/16" 1 ML MISC     . blood glucose meter kit and supplies Use to check blood sugars daily E11.65    . Cholecalciferol (VITAMIN D3) 5000 units CAPS Take 5,000 Units by mouth daily.    . Coenzyme Q10 (CO Q 10 PO) Take 1 tablet by mouth daily.    .  furosemide (LASIX) 80 MG tablet Take 160 mg by mouth 2 (two) times daily.     Marland Kitchen gabapentin (NEURONTIN) 300 MG capsule Take 1 capsule by mouth once daily 90 capsule 1  . gentamicin cream (GARAMYCIN) 0.1 % APPLY TO EXIT SITE DAILY    . heparin 1000 UNIT/ML injection 500 Units.     . hydrALAZINE (APRESOLINE) 100 MG  tablet Take 1 tablet (100 mg total) by mouth 3 (three) times daily. 270 tablet 3  . metolazone (ZAROXOLYN) 5 MG tablet Take 5 mg by mouth as needed.    . nitroGLYCERIN (NITROSTAT) 0.4 MG SL tablet Place 1 tablet (0.4 mg total) under the tongue every 5 (five) minutes as needed for chest pain. 25 tablet 0  . NOVOLIN N 100 UNIT/ML injection Inject 32 Units into the skin 2 (two) times daily before a meal.     . NOVOLIN R 100 UNIT/ML injection Inject 5-20 Units into the skin 3 (three) times daily with meals.   5  . ONETOUCH ULTRA test strip USE 1 STRIP TO CHECK GLUCOSE 3 TIMES DAILY 300 each 0  . Potassium Chloride ER 20 MEQ TBCR Take 40 mEq by mouth daily.     . rosuvastatin (CRESTOR) 40 MG tablet Take 1 tablet (40 mg total) by mouth daily. 90 tablet 3  . Transparent Dressings (TEGADERM FIRST AID STYLE) MISC To be used with changing of peritoneal dialysis. Dx z99.2 Dove [phone] 50 each prn  . VITAMIN E PO Take 1 capsule by mouth daily.    Marland Kitchen warfarin (COUMADIN) 5 MG tablet TAKE 1 TABLET BY MOUTH ONCE DAILY AT 6 PM MONITOR INR GOAL SHOULD BE BETWEEN 2-3. (Patient taking differently: Take 5 mg by mouth daily. Take 22m Monday, Wednesday, and Saturday.  Take 2.546mon Tuesday, Thursday, Friday, and sunday) 90 tablet 0  . zinc sulfate 220 (50 Zn) MG capsule Take 220 mg by mouth daily.    . Marland Kitchenllopurinol (ZYLOPRIM) 100 MG tablet Take 1-1.5 tablets (100-150 mg total) by mouth 2 (two) times daily. 270 tablet 0  . Continuous Blood Gluc Receiver (FREESTYLE LIBRE 14 DAY READER) DEVI Check blood sugar 4 times daily. 1 each prn  . Continuous Blood Gluc Sensor (FREESTYLE LIBRE 14 DAY SENSOR) MISC Check  blood sugar 4 times daily. 1 each prn  . isosorbide dinitrate (ISORDIL) 20 MG tablet Take 1 tablet (20 mg total) by mouth 2 (two) times daily. 180 tablet 3   No facility-administered medications prior to visit.    Allergies  Allergen Reactions  . Hydrocodone Nausea And Vomiting    Other reaction(s): GI Upset (intolerance) Projectile vomiting Projectile vomiting  . Oxycodone Nausea And Vomiting    Other reaction(s): GI Upset (intolerance), Vomiting (intolerance) Projectile vomiting Projectile vomiting  . Dacarbazine Other (See Comments)    Other reaction(s): Unknown  . Tape Dermatitis, Itching and Rash    Patch used at dialysis    ROS Review of Systems    Objective:    Physical Exam Constitutional:      Appearance: He is well-developed.  HENT:     Head: Normocephalic and atraumatic.  Cardiovascular:     Rate and Rhythm: Normal rate. Rhythm irregular.     Heart sounds: Normal heart sounds.  Pulmonary:     Effort: Pulmonary effort is normal.     Breath sounds: Normal breath sounds.  Skin:    General: Skin is warm and dry.  Neurological:     Mental Status: He is alert and oriented to person, place, and time.  Psychiatric:        Behavior: Behavior normal.     BP 131/66   Pulse 84   Ht 5' 11"  (1.803 m)   Wt 231 lb (104.8 kg)   SpO2 100%   BMI 32.22 kg/m  Wt Readings from Last 3 Encounters:  05/26/20 232 lb (105.2 kg)  05/25/20 231  lb (104.8 kg)  04/28/20 232 lb 1.3 oz (105.3 kg)     Health Maintenance Due  Topic Date Due  . INFLUENZA VACCINE  06/20/2020    There are no preventive care reminders to display for this patient.  Lab Results  Component Value Date   TSH 2.391 02/02/2017   Lab Results  Component Value Date   WBC 7.5 03/08/2020   HGB 15.2 03/08/2020   HCT 46.8 03/08/2020   MCV 91.4 03/08/2020   PLT 234 03/08/2020   Lab Results  Component Value Date   NA 139 10/27/2019   K 3.0 (L) 10/27/2019   CO2 27 10/27/2019   GLUCOSE 79  10/27/2019   BUN 43 (H) 10/27/2019   CREATININE 3.66 (H) 10/27/2019   BILITOT 0.8 05/28/2020   ALKPHOS 105 10/27/2019   AST 23 05/28/2020   ALT 17 05/28/2020   PROT 6.2 05/28/2020   ALBUMIN 3.1 (L) 10/27/2019   CALCIUM 9.2 10/27/2019   ANIONGAP 13 10/27/2019   GFR 38.90 (L) 03/10/2011   Lab Results  Component Value Date   CHOL 118 05/28/2020   Lab Results  Component Value Date   HDL 38 (L) 05/28/2020   Lab Results  Component Value Date   LDLCALC 56 05/28/2020   Lab Results  Component Value Date   TRIG 161 (H) 05/28/2020   Lab Results  Component Value Date   CHOLHDL 3.1 05/28/2020   Lab Results  Component Value Date   HGBA1C 7.0 (A) 05/25/2020      Assessment & Plan:   Problem List Items Addressed This Visit      Cardiovascular and Mediastinum   HYPERTENSION, BENIGN SYSTEMIC    Well controlled. Continue current regimen. Follow up in  3-4 mo. he does have follow-up with cardiology this week.      Atrial fibrillation ([place]) - Primary    Rate controlled.  See anticoagulation flowsheet for adjustment to Coumadin.  INR at goal.  No changes made today.  Recheck in 3 weeks.      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  He still has a pretty significantly elevated glucose first thing in the morning.  We can have him move his Novolin in to suppertime instead of bedtime and see if this makes a difference and see if he also has fewer hypoglycemic events around lunchtime.      Relevant Orders   POCT glycosylated hemoglobin (Hb A1C) (Completed)   Diabetic polyneuropathy associated with type 2 diabetes mellitus ([place])    Continue regimen as below.  Pt frequently adjust insulin due to self-glucose testing results and uses a Sliding Scale.         CKD stage 5 due to type 2 diabetes mellitus ([place])    Virtual visit follow-up with nephrology on Thursday.  Just had labs drawn yesterday we will call to get a copy of those.        Other   Dependence on peritoneal dialysis ([place])     DMV form completed for handicap placard as well.  No orders of the defined types were placed in this encounter.   Follow-up: Return in about 6 weeks (around 07/06/2020) for Diabetes follow-up.    [name], MD

## 2020-05-25 NOTE — Assessment & Plan Note (Signed)
Virtual visit follow-up with nephrology on Thursday.  Just had labs drawn yesterday we will call to get a copy of those.

## 2020-05-25 NOTE — Telephone Encounter (Signed)
Yes, okay to initiate home health.

## 2020-05-25 NOTE — Progress Notes (Signed)
Anticoagulation flowsheet for adjustments.

## 2020-05-25 NOTE — Assessment & Plan Note (Signed)
Well controlled. Continue current regimen. Follow up in  3-4 mo. he does have follow-up with cardiology this week.

## 2020-05-26 ENCOUNTER — Encounter: Payer: Self-pay | Admitting: Cardiology

## 2020-05-26 ENCOUNTER — Ambulatory Visit (INDEPENDENT_AMBULATORY_CARE_PROVIDER_SITE_OTHER): Payer: Medicare Other | Admitting: Cardiology

## 2020-05-26 ENCOUNTER — Ambulatory Visit: Payer: Medicare Other

## 2020-05-26 ENCOUNTER — Other Ambulatory Visit: Payer: Self-pay

## 2020-05-26 VITALS — BP 125/62 | HR 78 | Ht 71.0 in | Wt 232.0 lb

## 2020-05-26 DIAGNOSIS — N186 End stage renal disease: Secondary | ICD-10-CM | POA: Diagnosis not present

## 2020-05-26 DIAGNOSIS — D631 Anemia in chronic kidney disease: Secondary | ICD-10-CM | POA: Diagnosis not present

## 2020-05-26 DIAGNOSIS — E7800 Pure hypercholesterolemia, unspecified: Secondary | ICD-10-CM | POA: Diagnosis not present

## 2020-05-26 DIAGNOSIS — I480 Paroxysmal atrial fibrillation: Secondary | ICD-10-CM

## 2020-05-26 DIAGNOSIS — I2510 Atherosclerotic heart disease of native coronary artery without angina pectoris: Secondary | ICD-10-CM | POA: Diagnosis not present

## 2020-05-26 DIAGNOSIS — I10 Essential (primary) hypertension: Secondary | ICD-10-CM | POA: Diagnosis not present

## 2020-05-26 DIAGNOSIS — Z4932 Encounter for adequacy testing for peritoneal dialysis: Secondary | ICD-10-CM | POA: Diagnosis not present

## 2020-05-26 DIAGNOSIS — N2581 Secondary hyperparathyroidism of renal origin: Secondary | ICD-10-CM | POA: Diagnosis not present

## 2020-05-26 DIAGNOSIS — Z992 Dependence on renal dialysis: Secondary | ICD-10-CM | POA: Diagnosis not present

## 2020-05-26 NOTE — Patient Instructions (Addendum)
Medication Instructions:  No CHANGE *If you need a refill on your cardiac medications before your next appointment, please call your pharmacy*   Lab Work:  Your physician recommends that you return for lab work PRIOR TO EATING  If you have labs (blood work) drawn today and your tests are completely normal, you will receive your results only by: Marland Kitchen MyChart Message (if you have MyChart) OR . A paper copy in the mail If you have any lab test that is abnormal or we need to change your treatment, we will call you to review the results.   Follow-Up: At [facility], you and your health needs are our priority.  As part of our continuing mission to provide you with exceptional heart care, we have created designated Provider Care Teams.  These Care Teams include your primary Cardiologist (physician) and Advanced Practice Providers (APPs -  Physician Assistants and Nurse Practitioners) who all work together to provide you with the care you need, when you need it.  We recommend signing up for the patient portal called "MyChart".  Sign up information is provided on this After Visit Summary.  MyChart is used to connect with patients for Virtual Visits (Telemedicine).  Patients are able to view lab/test results, encounter notes, upcoming appointments, etc.  Non-urgent messages can be sent to your provider as well.   To learn more about what you can do with MyChart, go to [email or web address].    Your next appointment:   6 month(s)  The format for your next appointment:   In Person  Provider:   [name], MD

## 2020-05-26 NOTE — Telephone Encounter (Signed)
Placed orders for home health.

## 2020-05-27 DIAGNOSIS — Z7901 Long term (current) use of anticoagulants: Secondary | ICD-10-CM | POA: Diagnosis not present

## 2020-05-27 DIAGNOSIS — M109 Gout, unspecified: Secondary | ICD-10-CM | POA: Diagnosis not present

## 2020-05-27 DIAGNOSIS — E785 Hyperlipidemia, unspecified: Secondary | ICD-10-CM | POA: Diagnosis not present

## 2020-05-27 DIAGNOSIS — Z992 Dependence on renal dialysis: Secondary | ICD-10-CM | POA: Diagnosis not present

## 2020-05-27 DIAGNOSIS — I5030 Unspecified diastolic (congestive) heart failure: Secondary | ICD-10-CM | POA: Diagnosis not present

## 2020-05-27 DIAGNOSIS — N186 End stage renal disease: Secondary | ICD-10-CM | POA: Diagnosis not present

## 2020-05-27 DIAGNOSIS — R2681 Unsteadiness on feet: Secondary | ICD-10-CM | POA: Diagnosis not present

## 2020-05-27 DIAGNOSIS — D631 Anemia in chronic kidney disease: Secondary | ICD-10-CM | POA: Diagnosis not present

## 2020-05-27 DIAGNOSIS — R2689 Other abnormalities of gait and mobility: Secondary | ICD-10-CM | POA: Diagnosis not present

## 2020-05-27 DIAGNOSIS — E11610 Type 2 diabetes mellitus with diabetic neuropathic arthropathy: Secondary | ICD-10-CM | POA: Diagnosis not present

## 2020-05-27 DIAGNOSIS — N2581 Secondary hyperparathyroidism of renal origin: Secondary | ICD-10-CM | POA: Diagnosis not present

## 2020-05-27 DIAGNOSIS — Z4932 Encounter for adequacy testing for peritoneal dialysis: Secondary | ICD-10-CM | POA: Diagnosis not present

## 2020-05-27 DIAGNOSIS — I2510 Atherosclerotic heart disease of native coronary artery without angina pectoris: Secondary | ICD-10-CM | POA: Diagnosis not present

## 2020-05-27 DIAGNOSIS — I480 Paroxysmal atrial fibrillation: Secondary | ICD-10-CM | POA: Diagnosis not present

## 2020-05-27 DIAGNOSIS — E1122 Type 2 diabetes mellitus with diabetic chronic kidney disease: Secondary | ICD-10-CM | POA: Diagnosis not present

## 2020-05-27 DIAGNOSIS — Z794 Long term (current) use of insulin: Secondary | ICD-10-CM | POA: Diagnosis not present

## 2020-05-27 DIAGNOSIS — I120 Hypertensive chronic kidney disease with stage 5 chronic kidney disease or end stage renal disease: Secondary | ICD-10-CM | POA: Diagnosis not present

## 2020-05-27 DIAGNOSIS — Z9989 Dependence on other enabling machines and devices: Secondary | ICD-10-CM | POA: Diagnosis not present

## 2020-05-28 DIAGNOSIS — Z4932 Encounter for adequacy testing for peritoneal dialysis: Secondary | ICD-10-CM | POA: Diagnosis not present

## 2020-05-28 DIAGNOSIS — E1122 Type 2 diabetes mellitus with diabetic chronic kidney disease: Secondary | ICD-10-CM | POA: Diagnosis not present

## 2020-05-28 DIAGNOSIS — I480 Paroxysmal atrial fibrillation: Secondary | ICD-10-CM | POA: Diagnosis not present

## 2020-05-28 DIAGNOSIS — D631 Anemia in chronic kidney disease: Secondary | ICD-10-CM | POA: Diagnosis not present

## 2020-05-28 DIAGNOSIS — N186 End stage renal disease: Secondary | ICD-10-CM | POA: Diagnosis not present

## 2020-05-28 DIAGNOSIS — N2581 Secondary hyperparathyroidism of renal origin: Secondary | ICD-10-CM | POA: Diagnosis not present

## 2020-05-28 DIAGNOSIS — E7800 Pure hypercholesterolemia, unspecified: Secondary | ICD-10-CM | POA: Diagnosis not present

## 2020-05-28 DIAGNOSIS — E11610 Type 2 diabetes mellitus with diabetic neuropathic arthropathy: Secondary | ICD-10-CM | POA: Diagnosis not present

## 2020-05-28 DIAGNOSIS — Z992 Dependence on renal dialysis: Secondary | ICD-10-CM | POA: Diagnosis not present

## 2020-05-28 DIAGNOSIS — I5030 Unspecified diastolic (congestive) heart failure: Secondary | ICD-10-CM | POA: Diagnosis not present

## 2020-05-28 DIAGNOSIS — I120 Hypertensive chronic kidney disease with stage 5 chronic kidney disease or end stage renal disease: Secondary | ICD-10-CM | POA: Diagnosis not present

## 2020-05-29 DIAGNOSIS — Z4932 Encounter for adequacy testing for peritoneal dialysis: Secondary | ICD-10-CM | POA: Diagnosis not present

## 2020-05-29 DIAGNOSIS — Z992 Dependence on renal dialysis: Secondary | ICD-10-CM | POA: Diagnosis not present

## 2020-05-29 DIAGNOSIS — D631 Anemia in chronic kidney disease: Secondary | ICD-10-CM | POA: Diagnosis not present

## 2020-05-29 DIAGNOSIS — N186 End stage renal disease: Secondary | ICD-10-CM | POA: Diagnosis not present

## 2020-05-29 DIAGNOSIS — N2581 Secondary hyperparathyroidism of renal origin: Secondary | ICD-10-CM | POA: Diagnosis not present

## 2020-05-29 LAB — HEPATIC FUNCTION PANEL
AG Ratio: 1.5 (calc) (ref 1.0–2.5)
ALT: 17 U/L (ref 9–46)
AST: 23 U/L (ref 10–35)
Albumin: 3.7 g/dL (ref 3.6–5.1)
Alkaline phosphatase (APISO): 119 U/L (ref 35–144)
Bilirubin, Direct: 0.2 mg/dL (ref 0.0–0.2)
Globulin: 2.5 g/dL (calc) (ref 1.9–3.7)
Indirect Bilirubin: 0.6 mg/dL (calc) (ref 0.2–1.2)
Total Bilirubin: 0.8 mg/dL (ref 0.2–1.2)
Total Protein: 6.2 g/dL (ref 6.1–8.1)

## 2020-05-29 LAB — LIPID PANEL
Cholesterol: 118 mg/dL (ref <200)
HDL: 38 mg/dL — ABNORMAL LOW (ref >=40)
LDL Cholesterol (Calc): 56 mg/dL (calc)
Non-HDL Cholesterol (Calc): 80 mg/dL (calc) (ref <130)
Total CHOL/HDL Ratio: 3.1 (calc) (ref <5.0)
Triglycerides: 161 mg/dL — ABNORMAL HIGH (ref <150)

## 2020-05-30 DIAGNOSIS — Z992 Dependence on renal dialysis: Secondary | ICD-10-CM | POA: Diagnosis not present

## 2020-05-30 DIAGNOSIS — N186 End stage renal disease: Secondary | ICD-10-CM | POA: Diagnosis not present

## 2020-05-30 DIAGNOSIS — Z4932 Encounter for adequacy testing for peritoneal dialysis: Secondary | ICD-10-CM | POA: Diagnosis not present

## 2020-05-30 DIAGNOSIS — D631 Anemia in chronic kidney disease: Secondary | ICD-10-CM | POA: Diagnosis not present

## 2020-05-30 DIAGNOSIS — N2581 Secondary hyperparathyroidism of renal origin: Secondary | ICD-10-CM | POA: Diagnosis not present

## 2020-05-31 DIAGNOSIS — T782XXA Anaphylactic shock, unspecified, initial encounter: Secondary | ICD-10-CM | POA: Insufficient documentation

## 2020-05-31 DIAGNOSIS — D631 Anemia in chronic kidney disease: Secondary | ICD-10-CM | POA: Diagnosis not present

## 2020-05-31 DIAGNOSIS — Z4932 Encounter for adequacy testing for peritoneal dialysis: Secondary | ICD-10-CM | POA: Diagnosis not present

## 2020-05-31 DIAGNOSIS — Z992 Dependence on renal dialysis: Secondary | ICD-10-CM | POA: Diagnosis not present

## 2020-05-31 DIAGNOSIS — N186 End stage renal disease: Secondary | ICD-10-CM | POA: Diagnosis not present

## 2020-05-31 DIAGNOSIS — N2581 Secondary hyperparathyroidism of renal origin: Secondary | ICD-10-CM | POA: Diagnosis not present

## 2020-06-01 DIAGNOSIS — I120 Hypertensive chronic kidney disease with stage 5 chronic kidney disease or end stage renal disease: Secondary | ICD-10-CM | POA: Diagnosis not present

## 2020-06-01 DIAGNOSIS — I480 Paroxysmal atrial fibrillation: Secondary | ICD-10-CM | POA: Diagnosis not present

## 2020-06-01 DIAGNOSIS — N186 End stage renal disease: Secondary | ICD-10-CM | POA: Diagnosis not present

## 2020-06-01 DIAGNOSIS — D631 Anemia in chronic kidney disease: Secondary | ICD-10-CM | POA: Diagnosis not present

## 2020-06-01 DIAGNOSIS — E1122 Type 2 diabetes mellitus with diabetic chronic kidney disease: Secondary | ICD-10-CM | POA: Diagnosis not present

## 2020-06-01 DIAGNOSIS — Z4932 Encounter for adequacy testing for peritoneal dialysis: Secondary | ICD-10-CM | POA: Diagnosis not present

## 2020-06-01 DIAGNOSIS — I5030 Unspecified diastolic (congestive) heart failure: Secondary | ICD-10-CM | POA: Diagnosis not present

## 2020-06-01 DIAGNOSIS — E11610 Type 2 diabetes mellitus with diabetic neuropathic arthropathy: Secondary | ICD-10-CM | POA: Diagnosis not present

## 2020-06-01 DIAGNOSIS — Z992 Dependence on renal dialysis: Secondary | ICD-10-CM | POA: Diagnosis not present

## 2020-06-01 DIAGNOSIS — N2581 Secondary hyperparathyroidism of renal origin: Secondary | ICD-10-CM | POA: Diagnosis not present

## 2020-06-02 DIAGNOSIS — N2581 Secondary hyperparathyroidism of renal origin: Secondary | ICD-10-CM | POA: Diagnosis not present

## 2020-06-02 DIAGNOSIS — E1122 Type 2 diabetes mellitus with diabetic chronic kidney disease: Secondary | ICD-10-CM | POA: Diagnosis not present

## 2020-06-02 DIAGNOSIS — D631 Anemia in chronic kidney disease: Secondary | ICD-10-CM | POA: Diagnosis not present

## 2020-06-02 DIAGNOSIS — Z992 Dependence on renal dialysis: Secondary | ICD-10-CM | POA: Diagnosis not present

## 2020-06-02 DIAGNOSIS — I5030 Unspecified diastolic (congestive) heart failure: Secondary | ICD-10-CM | POA: Diagnosis not present

## 2020-06-02 DIAGNOSIS — Z4932 Encounter for adequacy testing for peritoneal dialysis: Secondary | ICD-10-CM | POA: Diagnosis not present

## 2020-06-02 DIAGNOSIS — N186 End stage renal disease: Secondary | ICD-10-CM | POA: Diagnosis not present

## 2020-06-02 DIAGNOSIS — I120 Hypertensive chronic kidney disease with stage 5 chronic kidney disease or end stage renal disease: Secondary | ICD-10-CM | POA: Diagnosis not present

## 2020-06-02 DIAGNOSIS — I480 Paroxysmal atrial fibrillation: Secondary | ICD-10-CM | POA: Diagnosis not present

## 2020-06-02 DIAGNOSIS — E11610 Type 2 diabetes mellitus with diabetic neuropathic arthropathy: Secondary | ICD-10-CM | POA: Diagnosis not present

## 2020-06-03 DIAGNOSIS — D631 Anemia in chronic kidney disease: Secondary | ICD-10-CM | POA: Diagnosis not present

## 2020-06-03 DIAGNOSIS — E11610 Type 2 diabetes mellitus with diabetic neuropathic arthropathy: Secondary | ICD-10-CM | POA: Diagnosis not present

## 2020-06-03 DIAGNOSIS — I480 Paroxysmal atrial fibrillation: Secondary | ICD-10-CM | POA: Diagnosis not present

## 2020-06-03 DIAGNOSIS — N2581 Secondary hyperparathyroidism of renal origin: Secondary | ICD-10-CM | POA: Diagnosis not present

## 2020-06-03 DIAGNOSIS — N186 End stage renal disease: Secondary | ICD-10-CM | POA: Diagnosis not present

## 2020-06-03 DIAGNOSIS — Z4932 Encounter for adequacy testing for peritoneal dialysis: Secondary | ICD-10-CM | POA: Diagnosis not present

## 2020-06-03 DIAGNOSIS — Z992 Dependence on renal dialysis: Secondary | ICD-10-CM | POA: Diagnosis not present

## 2020-06-04 DIAGNOSIS — N2581 Secondary hyperparathyroidism of renal origin: Secondary | ICD-10-CM | POA: Diagnosis not present

## 2020-06-04 DIAGNOSIS — D631 Anemia in chronic kidney disease: Secondary | ICD-10-CM | POA: Diagnosis not present

## 2020-06-04 DIAGNOSIS — Z4932 Encounter for adequacy testing for peritoneal dialysis: Secondary | ICD-10-CM | POA: Diagnosis not present

## 2020-06-04 DIAGNOSIS — Z992 Dependence on renal dialysis: Secondary | ICD-10-CM | POA: Diagnosis not present

## 2020-06-04 DIAGNOSIS — N186 End stage renal disease: Secondary | ICD-10-CM | POA: Diagnosis not present

## 2020-06-05 DIAGNOSIS — N2581 Secondary hyperparathyroidism of renal origin: Secondary | ICD-10-CM | POA: Diagnosis not present

## 2020-06-05 DIAGNOSIS — Z4932 Encounter for adequacy testing for peritoneal dialysis: Secondary | ICD-10-CM | POA: Diagnosis not present

## 2020-06-05 DIAGNOSIS — D631 Anemia in chronic kidney disease: Secondary | ICD-10-CM | POA: Diagnosis not present

## 2020-06-05 DIAGNOSIS — Z992 Dependence on renal dialysis: Secondary | ICD-10-CM | POA: Diagnosis not present

## 2020-06-05 DIAGNOSIS — N186 End stage renal disease: Secondary | ICD-10-CM | POA: Diagnosis not present

## 2020-06-06 DIAGNOSIS — Z4932 Encounter for adequacy testing for peritoneal dialysis: Secondary | ICD-10-CM | POA: Diagnosis not present

## 2020-06-06 DIAGNOSIS — Z992 Dependence on renal dialysis: Secondary | ICD-10-CM | POA: Diagnosis not present

## 2020-06-06 DIAGNOSIS — N2581 Secondary hyperparathyroidism of renal origin: Secondary | ICD-10-CM | POA: Diagnosis not present

## 2020-06-06 DIAGNOSIS — D631 Anemia in chronic kidney disease: Secondary | ICD-10-CM | POA: Diagnosis not present

## 2020-06-06 DIAGNOSIS — N186 End stage renal disease: Secondary | ICD-10-CM | POA: Diagnosis not present

## 2020-06-07 DIAGNOSIS — Z4932 Encounter for adequacy testing for peritoneal dialysis: Secondary | ICD-10-CM | POA: Diagnosis not present

## 2020-06-07 DIAGNOSIS — Z992 Dependence on renal dialysis: Secondary | ICD-10-CM | POA: Diagnosis not present

## 2020-06-07 DIAGNOSIS — D631 Anemia in chronic kidney disease: Secondary | ICD-10-CM | POA: Diagnosis not present

## 2020-06-07 DIAGNOSIS — N186 End stage renal disease: Secondary | ICD-10-CM | POA: Diagnosis not present

## 2020-06-07 DIAGNOSIS — N2581 Secondary hyperparathyroidism of renal origin: Secondary | ICD-10-CM | POA: Diagnosis not present

## 2020-06-08 ENCOUNTER — Ambulatory Visit (INDEPENDENT_AMBULATORY_CARE_PROVIDER_SITE_OTHER): Payer: Medicare Other | Admitting: Family Medicine

## 2020-06-08 ENCOUNTER — Telehealth: Payer: Self-pay | Admitting: Family Medicine

## 2020-06-08 VITALS — BP 120/61 | HR 90

## 2020-06-08 DIAGNOSIS — Z992 Dependence on renal dialysis: Secondary | ICD-10-CM | POA: Diagnosis not present

## 2020-06-08 DIAGNOSIS — Z4932 Encounter for adequacy testing for peritoneal dialysis: Secondary | ICD-10-CM | POA: Diagnosis not present

## 2020-06-08 DIAGNOSIS — D631 Anemia in chronic kidney disease: Secondary | ICD-10-CM | POA: Diagnosis not present

## 2020-06-08 DIAGNOSIS — N2581 Secondary hyperparathyroidism of renal origin: Secondary | ICD-10-CM | POA: Diagnosis not present

## 2020-06-08 DIAGNOSIS — I4891 Unspecified atrial fibrillation: Secondary | ICD-10-CM

## 2020-06-08 DIAGNOSIS — N186 End stage renal disease: Secondary | ICD-10-CM | POA: Diagnosis not present

## 2020-06-08 DIAGNOSIS — I2510 Atherosclerotic heart disease of native coronary artery without angina pectoris: Secondary | ICD-10-CM | POA: Diagnosis not present

## 2020-06-08 LAB — POCT INR: INR: 2.1 (ref 2.0–3.0)

## 2020-06-08 NOTE — Progress Notes (Signed)
Well controlled. Continue current regimen. Follow up in  1 mo

## 2020-06-08 NOTE — Progress Notes (Signed)
Pt here for INR check no missed doses, diet changes,bruising,bleeding,CP,SOB  He is currently taking 5 mg Mon, Wed, Sat; 2.5 mg all other days

## 2020-06-08 NOTE — Telephone Encounter (Signed)
Please call [facility] and see if they dispensed the [place] meter or note

## 2020-06-09 ENCOUNTER — Ambulatory Visit: Payer: Medicare Other

## 2020-06-09 ENCOUNTER — Ambulatory Visit: Payer: Medicare Other | Admitting: *Deleted

## 2020-06-09 DIAGNOSIS — Z4932 Encounter for adequacy testing for peritoneal dialysis: Secondary | ICD-10-CM | POA: Diagnosis not present

## 2020-06-09 DIAGNOSIS — N2581 Secondary hyperparathyroidism of renal origin: Secondary | ICD-10-CM | POA: Diagnosis not present

## 2020-06-09 DIAGNOSIS — Z992 Dependence on renal dialysis: Secondary | ICD-10-CM | POA: Diagnosis not present

## 2020-06-09 DIAGNOSIS — I5030 Unspecified diastolic (congestive) heart failure: Secondary | ICD-10-CM | POA: Diagnosis not present

## 2020-06-09 DIAGNOSIS — E11610 Type 2 diabetes mellitus with diabetic neuropathic arthropathy: Secondary | ICD-10-CM | POA: Diagnosis not present

## 2020-06-09 DIAGNOSIS — D631 Anemia in chronic kidney disease: Secondary | ICD-10-CM | POA: Diagnosis not present

## 2020-06-09 DIAGNOSIS — N186 End stage renal disease: Secondary | ICD-10-CM | POA: Diagnosis not present

## 2020-06-09 DIAGNOSIS — E1122 Type 2 diabetes mellitus with diabetic chronic kidney disease: Secondary | ICD-10-CM | POA: Diagnosis not present

## 2020-06-09 DIAGNOSIS — I120 Hypertensive chronic kidney disease with stage 5 chronic kidney disease or end stage renal disease: Secondary | ICD-10-CM | POA: Diagnosis not present

## 2020-06-09 DIAGNOSIS — I480 Paroxysmal atrial fibrillation: Secondary | ICD-10-CM | POA: Diagnosis not present

## 2020-06-09 NOTE — Telephone Encounter (Signed)
Please call patient and let him know.

## 2020-06-09 NOTE — Telephone Encounter (Signed)
Insurance doesn't cover this meter. It is excluded on his plan.

## 2020-06-09 NOTE — Telephone Encounter (Signed)
Patient advised.

## 2020-06-10 DIAGNOSIS — Z4932 Encounter for adequacy testing for peritoneal dialysis: Secondary | ICD-10-CM | POA: Diagnosis not present

## 2020-06-10 DIAGNOSIS — I5030 Unspecified diastolic (congestive) heart failure: Secondary | ICD-10-CM | POA: Diagnosis not present

## 2020-06-10 DIAGNOSIS — I480 Paroxysmal atrial fibrillation: Secondary | ICD-10-CM | POA: Diagnosis not present

## 2020-06-10 DIAGNOSIS — N2581 Secondary hyperparathyroidism of renal origin: Secondary | ICD-10-CM | POA: Diagnosis not present

## 2020-06-10 DIAGNOSIS — E1122 Type 2 diabetes mellitus with diabetic chronic kidney disease: Secondary | ICD-10-CM | POA: Diagnosis not present

## 2020-06-10 DIAGNOSIS — N186 End stage renal disease: Secondary | ICD-10-CM | POA: Diagnosis not present

## 2020-06-10 DIAGNOSIS — Z992 Dependence on renal dialysis: Secondary | ICD-10-CM | POA: Diagnosis not present

## 2020-06-10 DIAGNOSIS — D631 Anemia in chronic kidney disease: Secondary | ICD-10-CM | POA: Diagnosis not present

## 2020-06-10 DIAGNOSIS — E11610 Type 2 diabetes mellitus with diabetic neuropathic arthropathy: Secondary | ICD-10-CM | POA: Diagnosis not present

## 2020-06-10 DIAGNOSIS — I120 Hypertensive chronic kidney disease with stage 5 chronic kidney disease or end stage renal disease: Secondary | ICD-10-CM | POA: Diagnosis not present

## 2020-06-11 DIAGNOSIS — Z992 Dependence on renal dialysis: Secondary | ICD-10-CM | POA: Diagnosis not present

## 2020-06-11 DIAGNOSIS — D631 Anemia in chronic kidney disease: Secondary | ICD-10-CM | POA: Diagnosis not present

## 2020-06-11 DIAGNOSIS — Z4932 Encounter for adequacy testing for peritoneal dialysis: Secondary | ICD-10-CM | POA: Diagnosis not present

## 2020-06-11 DIAGNOSIS — N186 End stage renal disease: Secondary | ICD-10-CM | POA: Diagnosis not present

## 2020-06-11 DIAGNOSIS — N2581 Secondary hyperparathyroidism of renal origin: Secondary | ICD-10-CM | POA: Diagnosis not present

## 2020-06-12 DIAGNOSIS — D631 Anemia in chronic kidney disease: Secondary | ICD-10-CM | POA: Diagnosis not present

## 2020-06-12 DIAGNOSIS — N2581 Secondary hyperparathyroidism of renal origin: Secondary | ICD-10-CM | POA: Diagnosis not present

## 2020-06-12 DIAGNOSIS — Z992 Dependence on renal dialysis: Secondary | ICD-10-CM | POA: Diagnosis not present

## 2020-06-12 DIAGNOSIS — Z4932 Encounter for adequacy testing for peritoneal dialysis: Secondary | ICD-10-CM | POA: Diagnosis not present

## 2020-06-12 DIAGNOSIS — N186 End stage renal disease: Secondary | ICD-10-CM | POA: Diagnosis not present

## 2020-06-13 DIAGNOSIS — Z992 Dependence on renal dialysis: Secondary | ICD-10-CM | POA: Diagnosis not present

## 2020-06-13 DIAGNOSIS — N186 End stage renal disease: Secondary | ICD-10-CM | POA: Diagnosis not present

## 2020-06-13 DIAGNOSIS — D631 Anemia in chronic kidney disease: Secondary | ICD-10-CM | POA: Diagnosis not present

## 2020-06-13 DIAGNOSIS — I120 Hypertensive chronic kidney disease with stage 5 chronic kidney disease or end stage renal disease: Secondary | ICD-10-CM | POA: Diagnosis not present

## 2020-06-13 DIAGNOSIS — I5030 Unspecified diastolic (congestive) heart failure: Secondary | ICD-10-CM | POA: Diagnosis not present

## 2020-06-13 DIAGNOSIS — N2581 Secondary hyperparathyroidism of renal origin: Secondary | ICD-10-CM | POA: Diagnosis not present

## 2020-06-13 DIAGNOSIS — Z4932 Encounter for adequacy testing for peritoneal dialysis: Secondary | ICD-10-CM | POA: Diagnosis not present

## 2020-06-13 DIAGNOSIS — I480 Paroxysmal atrial fibrillation: Secondary | ICD-10-CM | POA: Diagnosis not present

## 2020-06-13 DIAGNOSIS — E1122 Type 2 diabetes mellitus with diabetic chronic kidney disease: Secondary | ICD-10-CM | POA: Diagnosis not present

## 2020-06-13 DIAGNOSIS — E11610 Type 2 diabetes mellitus with diabetic neuropathic arthropathy: Secondary | ICD-10-CM | POA: Diagnosis not present

## 2020-06-14 ENCOUNTER — Other Ambulatory Visit: Payer: Self-pay | Admitting: Family Medicine

## 2020-06-14 DIAGNOSIS — M1000 Idiopathic gout, unspecified site: Secondary | ICD-10-CM

## 2020-06-14 DIAGNOSIS — Z992 Dependence on renal dialysis: Secondary | ICD-10-CM | POA: Diagnosis not present

## 2020-06-14 DIAGNOSIS — D631 Anemia in chronic kidney disease: Secondary | ICD-10-CM | POA: Diagnosis not present

## 2020-06-14 DIAGNOSIS — N184 Chronic kidney disease, stage 4 (severe): Secondary | ICD-10-CM

## 2020-06-14 DIAGNOSIS — E119 Type 2 diabetes mellitus without complications: Secondary | ICD-10-CM

## 2020-06-14 DIAGNOSIS — N2581 Secondary hyperparathyroidism of renal origin: Secondary | ICD-10-CM | POA: Diagnosis not present

## 2020-06-14 DIAGNOSIS — M10031 Idiopathic gout, right wrist: Secondary | ICD-10-CM

## 2020-06-14 DIAGNOSIS — E1122 Type 2 diabetes mellitus with diabetic chronic kidney disease: Secondary | ICD-10-CM

## 2020-06-14 DIAGNOSIS — Z4932 Encounter for adequacy testing for peritoneal dialysis: Secondary | ICD-10-CM | POA: Diagnosis not present

## 2020-06-14 DIAGNOSIS — N186 End stage renal disease: Secondary | ICD-10-CM | POA: Diagnosis not present

## 2020-06-14 MED ORDER — FREESTYLE LIBRE 14 DAY READER DEVI: 99 refills | Status: AC

## 2020-06-14 MED ORDER — FREESTYLE LIBRE 14 DAY SENSOR MISC: 99 refills | Status: DC

## 2020-06-15 ENCOUNTER — Telehealth: Payer: Self-pay

## 2020-06-15 DIAGNOSIS — I480 Paroxysmal atrial fibrillation: Secondary | ICD-10-CM | POA: Diagnosis not present

## 2020-06-15 DIAGNOSIS — I5030 Unspecified diastolic (congestive) heart failure: Secondary | ICD-10-CM | POA: Diagnosis not present

## 2020-06-15 DIAGNOSIS — I120 Hypertensive chronic kidney disease with stage 5 chronic kidney disease or end stage renal disease: Secondary | ICD-10-CM | POA: Diagnosis not present

## 2020-06-15 DIAGNOSIS — Z4932 Encounter for adequacy testing for peritoneal dialysis: Secondary | ICD-10-CM | POA: Diagnosis not present

## 2020-06-15 DIAGNOSIS — N186 End stage renal disease: Secondary | ICD-10-CM | POA: Diagnosis not present

## 2020-06-15 DIAGNOSIS — E11610 Type 2 diabetes mellitus with diabetic neuropathic arthropathy: Secondary | ICD-10-CM | POA: Diagnosis not present

## 2020-06-15 DIAGNOSIS — D631 Anemia in chronic kidney disease: Secondary | ICD-10-CM | POA: Diagnosis not present

## 2020-06-15 DIAGNOSIS — E1122 Type 2 diabetes mellitus with diabetic chronic kidney disease: Secondary | ICD-10-CM | POA: Diagnosis not present

## 2020-06-15 DIAGNOSIS — N2581 Secondary hyperparathyroidism of renal origin: Secondary | ICD-10-CM | POA: Diagnosis not present

## 2020-06-15 DIAGNOSIS — Z992 Dependence on renal dialysis: Secondary | ICD-10-CM | POA: Diagnosis not present

## 2020-06-15 NOTE — Telephone Encounter (Signed)
Ok, thank you

## 2020-06-15 NOTE — Telephone Encounter (Signed)
Nynica with [facility] called reporting a fall without injury.

## 2020-06-16 DIAGNOSIS — N2581 Secondary hyperparathyroidism of renal origin: Secondary | ICD-10-CM | POA: Diagnosis not present

## 2020-06-16 DIAGNOSIS — I120 Hypertensive chronic kidney disease with stage 5 chronic kidney disease or end stage renal disease: Secondary | ICD-10-CM | POA: Diagnosis not present

## 2020-06-16 DIAGNOSIS — D631 Anemia in chronic kidney disease: Secondary | ICD-10-CM | POA: Diagnosis not present

## 2020-06-16 DIAGNOSIS — Z4932 Encounter for adequacy testing for peritoneal dialysis: Secondary | ICD-10-CM | POA: Diagnosis not present

## 2020-06-16 DIAGNOSIS — E11610 Type 2 diabetes mellitus with diabetic neuropathic arthropathy: Secondary | ICD-10-CM | POA: Diagnosis not present

## 2020-06-16 DIAGNOSIS — I5030 Unspecified diastolic (congestive) heart failure: Secondary | ICD-10-CM | POA: Diagnosis not present

## 2020-06-16 DIAGNOSIS — N186 End stage renal disease: Secondary | ICD-10-CM | POA: Diagnosis not present

## 2020-06-16 DIAGNOSIS — E1122 Type 2 diabetes mellitus with diabetic chronic kidney disease: Secondary | ICD-10-CM | POA: Diagnosis not present

## 2020-06-16 DIAGNOSIS — Z992 Dependence on renal dialysis: Secondary | ICD-10-CM | POA: Diagnosis not present

## 2020-06-16 DIAGNOSIS — I480 Paroxysmal atrial fibrillation: Secondary | ICD-10-CM | POA: Diagnosis not present

## 2020-06-17 DIAGNOSIS — D631 Anemia in chronic kidney disease: Secondary | ICD-10-CM | POA: Diagnosis not present

## 2020-06-17 DIAGNOSIS — N2581 Secondary hyperparathyroidism of renal origin: Secondary | ICD-10-CM | POA: Diagnosis not present

## 2020-06-17 DIAGNOSIS — N186 End stage renal disease: Secondary | ICD-10-CM | POA: Diagnosis not present

## 2020-06-17 DIAGNOSIS — Z4932 Encounter for adequacy testing for peritoneal dialysis: Secondary | ICD-10-CM | POA: Diagnosis not present

## 2020-06-17 DIAGNOSIS — Z992 Dependence on renal dialysis: Secondary | ICD-10-CM | POA: Diagnosis not present

## 2020-06-17 DIAGNOSIS — E113213 Type 2 diabetes mellitus with mild nonproliferative diabetic retinopathy with macular edema, bilateral: Secondary | ICD-10-CM | POA: Diagnosis not present

## 2020-06-17 DIAGNOSIS — I5030 Unspecified diastolic (congestive) heart failure: Secondary | ICD-10-CM | POA: Diagnosis not present

## 2020-06-17 DIAGNOSIS — E11610 Type 2 diabetes mellitus with diabetic neuropathic arthropathy: Secondary | ICD-10-CM | POA: Diagnosis not present

## 2020-06-17 DIAGNOSIS — E1122 Type 2 diabetes mellitus with diabetic chronic kidney disease: Secondary | ICD-10-CM | POA: Diagnosis not present

## 2020-06-17 DIAGNOSIS — I120 Hypertensive chronic kidney disease with stage 5 chronic kidney disease or end stage renal disease: Secondary | ICD-10-CM | POA: Diagnosis not present

## 2020-06-17 DIAGNOSIS — I480 Paroxysmal atrial fibrillation: Secondary | ICD-10-CM | POA: Diagnosis not present

## 2020-06-18 DIAGNOSIS — N2581 Secondary hyperparathyroidism of renal origin: Secondary | ICD-10-CM | POA: Diagnosis not present

## 2020-06-18 DIAGNOSIS — D631 Anemia in chronic kidney disease: Secondary | ICD-10-CM | POA: Diagnosis not present

## 2020-06-18 DIAGNOSIS — I120 Hypertensive chronic kidney disease with stage 5 chronic kidney disease or end stage renal disease: Secondary | ICD-10-CM | POA: Diagnosis not present

## 2020-06-18 DIAGNOSIS — Z992 Dependence on renal dialysis: Secondary | ICD-10-CM | POA: Diagnosis not present

## 2020-06-18 DIAGNOSIS — Z4932 Encounter for adequacy testing for peritoneal dialysis: Secondary | ICD-10-CM | POA: Diagnosis not present

## 2020-06-18 DIAGNOSIS — N186 End stage renal disease: Secondary | ICD-10-CM | POA: Diagnosis not present

## 2020-06-18 DIAGNOSIS — E11610 Type 2 diabetes mellitus with diabetic neuropathic arthropathy: Secondary | ICD-10-CM | POA: Diagnosis not present

## 2020-06-18 DIAGNOSIS — E1122 Type 2 diabetes mellitus with diabetic chronic kidney disease: Secondary | ICD-10-CM | POA: Diagnosis not present

## 2020-06-18 DIAGNOSIS — I5030 Unspecified diastolic (congestive) heart failure: Secondary | ICD-10-CM | POA: Diagnosis not present

## 2020-06-18 DIAGNOSIS — I480 Paroxysmal atrial fibrillation: Secondary | ICD-10-CM | POA: Diagnosis not present

## 2020-06-19 DIAGNOSIS — D631 Anemia in chronic kidney disease: Secondary | ICD-10-CM | POA: Diagnosis not present

## 2020-06-19 DIAGNOSIS — N2581 Secondary hyperparathyroidism of renal origin: Secondary | ICD-10-CM | POA: Diagnosis not present

## 2020-06-19 DIAGNOSIS — Z4932 Encounter for adequacy testing for peritoneal dialysis: Secondary | ICD-10-CM | POA: Diagnosis not present

## 2020-06-19 DIAGNOSIS — Z992 Dependence on renal dialysis: Secondary | ICD-10-CM | POA: Diagnosis not present

## 2020-06-19 DIAGNOSIS — N186 End stage renal disease: Secondary | ICD-10-CM | POA: Diagnosis not present

## 2020-06-20 DIAGNOSIS — N2581 Secondary hyperparathyroidism of renal origin: Secondary | ICD-10-CM | POA: Diagnosis not present

## 2020-06-20 DIAGNOSIS — E876 Hypokalemia: Secondary | ICD-10-CM | POA: Diagnosis not present

## 2020-06-20 DIAGNOSIS — D689 Coagulation defect, unspecified: Secondary | ICD-10-CM | POA: Diagnosis not present

## 2020-06-20 DIAGNOSIS — N186 End stage renal disease: Secondary | ICD-10-CM | POA: Diagnosis not present

## 2020-06-20 DIAGNOSIS — D631 Anemia in chronic kidney disease: Secondary | ICD-10-CM | POA: Diagnosis not present

## 2020-06-20 DIAGNOSIS — E1122 Type 2 diabetes mellitus with diabetic chronic kidney disease: Secondary | ICD-10-CM | POA: Diagnosis not present

## 2020-06-20 DIAGNOSIS — Z992 Dependence on renal dialysis: Secondary | ICD-10-CM | POA: Diagnosis not present

## 2020-06-21 DIAGNOSIS — N186 End stage renal disease: Secondary | ICD-10-CM | POA: Diagnosis not present

## 2020-06-21 DIAGNOSIS — Z992 Dependence on renal dialysis: Secondary | ICD-10-CM | POA: Diagnosis not present

## 2020-06-22 DIAGNOSIS — E876 Hypokalemia: Secondary | ICD-10-CM | POA: Diagnosis not present

## 2020-06-22 DIAGNOSIS — Z992 Dependence on renal dialysis: Secondary | ICD-10-CM | POA: Diagnosis not present

## 2020-06-22 DIAGNOSIS — D689 Coagulation defect, unspecified: Secondary | ICD-10-CM | POA: Diagnosis not present

## 2020-06-22 DIAGNOSIS — N2581 Secondary hyperparathyroidism of renal origin: Secondary | ICD-10-CM | POA: Diagnosis not present

## 2020-06-22 DIAGNOSIS — D631 Anemia in chronic kidney disease: Secondary | ICD-10-CM | POA: Diagnosis not present

## 2020-06-22 DIAGNOSIS — D509 Iron deficiency anemia, unspecified: Secondary | ICD-10-CM | POA: Diagnosis not present

## 2020-06-22 DIAGNOSIS — E1122 Type 2 diabetes mellitus with diabetic chronic kidney disease: Secondary | ICD-10-CM | POA: Diagnosis not present

## 2020-06-22 DIAGNOSIS — N186 End stage renal disease: Secondary | ICD-10-CM | POA: Diagnosis not present

## 2020-06-23 DIAGNOSIS — I480 Paroxysmal atrial fibrillation: Secondary | ICD-10-CM | POA: Diagnosis not present

## 2020-06-23 DIAGNOSIS — N186 End stage renal disease: Secondary | ICD-10-CM | POA: Diagnosis not present

## 2020-06-23 DIAGNOSIS — E11610 Type 2 diabetes mellitus with diabetic neuropathic arthropathy: Secondary | ICD-10-CM | POA: Diagnosis not present

## 2020-06-23 DIAGNOSIS — I5030 Unspecified diastolic (congestive) heart failure: Secondary | ICD-10-CM | POA: Diagnosis not present

## 2020-06-23 DIAGNOSIS — E1122 Type 2 diabetes mellitus with diabetic chronic kidney disease: Secondary | ICD-10-CM | POA: Diagnosis not present

## 2020-06-23 DIAGNOSIS — I120 Hypertensive chronic kidney disease with stage 5 chronic kidney disease or end stage renal disease: Secondary | ICD-10-CM | POA: Diagnosis not present

## 2020-06-24 DIAGNOSIS — D689 Coagulation defect, unspecified: Secondary | ICD-10-CM | POA: Diagnosis not present

## 2020-06-24 DIAGNOSIS — Z992 Dependence on renal dialysis: Secondary | ICD-10-CM | POA: Diagnosis not present

## 2020-06-24 DIAGNOSIS — E876 Hypokalemia: Secondary | ICD-10-CM | POA: Diagnosis not present

## 2020-06-24 DIAGNOSIS — N2581 Secondary hyperparathyroidism of renal origin: Secondary | ICD-10-CM | POA: Diagnosis not present

## 2020-06-24 DIAGNOSIS — D631 Anemia in chronic kidney disease: Secondary | ICD-10-CM | POA: Diagnosis not present

## 2020-06-24 DIAGNOSIS — N186 End stage renal disease: Secondary | ICD-10-CM | POA: Diagnosis not present

## 2020-06-25 DIAGNOSIS — E1122 Type 2 diabetes mellitus with diabetic chronic kidney disease: Secondary | ICD-10-CM | POA: Diagnosis not present

## 2020-06-25 DIAGNOSIS — I120 Hypertensive chronic kidney disease with stage 5 chronic kidney disease or end stage renal disease: Secondary | ICD-10-CM | POA: Diagnosis not present

## 2020-06-25 DIAGNOSIS — I5030 Unspecified diastolic (congestive) heart failure: Secondary | ICD-10-CM | POA: Diagnosis not present

## 2020-06-25 DIAGNOSIS — I480 Paroxysmal atrial fibrillation: Secondary | ICD-10-CM | POA: Diagnosis not present

## 2020-06-25 DIAGNOSIS — E11610 Type 2 diabetes mellitus with diabetic neuropathic arthropathy: Secondary | ICD-10-CM | POA: Diagnosis not present

## 2020-06-25 DIAGNOSIS — N186 End stage renal disease: Secondary | ICD-10-CM | POA: Diagnosis not present

## 2020-06-26 DIAGNOSIS — M7981 Nontraumatic hematoma of soft tissue: Secondary | ICD-10-CM | POA: Diagnosis not present

## 2020-06-26 DIAGNOSIS — I480 Paroxysmal atrial fibrillation: Secondary | ICD-10-CM | POA: Diagnosis not present

## 2020-06-26 DIAGNOSIS — Z885 Allergy status to narcotic agent status: Secondary | ICD-10-CM | POA: Diagnosis not present

## 2020-06-26 DIAGNOSIS — Z8679 Personal history of other diseases of the circulatory system: Secondary | ICD-10-CM | POA: Diagnosis not present

## 2020-06-26 DIAGNOSIS — E876 Hypokalemia: Secondary | ICD-10-CM | POA: Diagnosis not present

## 2020-06-26 DIAGNOSIS — M79605 Pain in left leg: Secondary | ICD-10-CM | POA: Diagnosis not present

## 2020-06-26 DIAGNOSIS — E11319 Type 2 diabetes mellitus with unspecified diabetic retinopathy without macular edema: Secondary | ICD-10-CM | POA: Diagnosis not present

## 2020-06-26 DIAGNOSIS — I2510 Atherosclerotic heart disease of native coronary artery without angina pectoris: Secondary | ICD-10-CM | POA: Diagnosis not present

## 2020-06-26 DIAGNOSIS — S8012XA Contusion of left lower leg, initial encounter: Secondary | ICD-10-CM | POA: Diagnosis not present

## 2020-06-26 DIAGNOSIS — M79662 Pain in left lower leg: Secondary | ICD-10-CM | POA: Diagnosis not present

## 2020-06-26 DIAGNOSIS — N2581 Secondary hyperparathyroidism of renal origin: Secondary | ICD-10-CM | POA: Diagnosis not present

## 2020-06-26 DIAGNOSIS — Z79899 Other long term (current) drug therapy: Secondary | ICD-10-CM | POA: Diagnosis not present

## 2020-06-26 DIAGNOSIS — N289 Disorder of kidney and ureter, unspecified: Secondary | ICD-10-CM | POA: Diagnosis not present

## 2020-06-26 DIAGNOSIS — I120 Hypertensive chronic kidney disease with stage 5 chronic kidney disease or end stage renal disease: Secondary | ICD-10-CM | POA: Diagnosis not present

## 2020-06-26 DIAGNOSIS — R2689 Other abnormalities of gait and mobility: Secondary | ICD-10-CM | POA: Diagnosis not present

## 2020-06-26 DIAGNOSIS — M109 Gout, unspecified: Secondary | ICD-10-CM | POA: Diagnosis not present

## 2020-06-26 DIAGNOSIS — I509 Heart failure, unspecified: Secondary | ICD-10-CM | POA: Diagnosis not present

## 2020-06-26 DIAGNOSIS — Z992 Dependence on renal dialysis: Secondary | ICD-10-CM | POA: Diagnosis not present

## 2020-06-26 DIAGNOSIS — Z794 Long term (current) use of insulin: Secondary | ICD-10-CM | POA: Diagnosis not present

## 2020-06-26 DIAGNOSIS — N186 End stage renal disease: Secondary | ICD-10-CM | POA: Diagnosis not present

## 2020-06-26 DIAGNOSIS — E11610 Type 2 diabetes mellitus with diabetic neuropathic arthropathy: Secondary | ICD-10-CM | POA: Diagnosis not present

## 2020-06-26 DIAGNOSIS — D631 Anemia in chronic kidney disease: Secondary | ICD-10-CM | POA: Diagnosis not present

## 2020-06-26 DIAGNOSIS — E785 Hyperlipidemia, unspecified: Secondary | ICD-10-CM | POA: Diagnosis not present

## 2020-06-26 DIAGNOSIS — Z9989 Dependence on other enabling machines and devices: Secondary | ICD-10-CM | POA: Diagnosis not present

## 2020-06-26 DIAGNOSIS — I110 Hypertensive heart disease with heart failure: Secondary | ICD-10-CM | POA: Diagnosis not present

## 2020-06-26 DIAGNOSIS — Z7901 Long term (current) use of anticoagulants: Secondary | ICD-10-CM | POA: Diagnosis not present

## 2020-06-26 DIAGNOSIS — E1142 Type 2 diabetes mellitus with diabetic polyneuropathy: Secondary | ICD-10-CM | POA: Diagnosis not present

## 2020-06-26 DIAGNOSIS — I5030 Unspecified diastolic (congestive) heart failure: Secondary | ICD-10-CM | POA: Diagnosis not present

## 2020-06-26 DIAGNOSIS — R2681 Unsteadiness on feet: Secondary | ICD-10-CM | POA: Diagnosis not present

## 2020-06-26 DIAGNOSIS — D689 Coagulation defect, unspecified: Secondary | ICD-10-CM | POA: Diagnosis not present

## 2020-06-26 DIAGNOSIS — E1129 Type 2 diabetes mellitus with other diabetic kidney complication: Secondary | ICD-10-CM | POA: Diagnosis not present

## 2020-06-26 DIAGNOSIS — E1122 Type 2 diabetes mellitus with diabetic chronic kidney disease: Secondary | ICD-10-CM | POA: Diagnosis not present

## 2020-06-28 DIAGNOSIS — I120 Hypertensive chronic kidney disease with stage 5 chronic kidney disease or end stage renal disease: Secondary | ICD-10-CM | POA: Diagnosis not present

## 2020-06-28 DIAGNOSIS — E11610 Type 2 diabetes mellitus with diabetic neuropathic arthropathy: Secondary | ICD-10-CM | POA: Diagnosis not present

## 2020-06-28 DIAGNOSIS — E1122 Type 2 diabetes mellitus with diabetic chronic kidney disease: Secondary | ICD-10-CM | POA: Diagnosis not present

## 2020-06-28 DIAGNOSIS — N186 End stage renal disease: Secondary | ICD-10-CM | POA: Diagnosis not present

## 2020-06-28 DIAGNOSIS — I480 Paroxysmal atrial fibrillation: Secondary | ICD-10-CM | POA: Diagnosis not present

## 2020-06-28 DIAGNOSIS — I5030 Unspecified diastolic (congestive) heart failure: Secondary | ICD-10-CM | POA: Diagnosis not present

## 2020-06-29 DIAGNOSIS — D631 Anemia in chronic kidney disease: Secondary | ICD-10-CM | POA: Diagnosis not present

## 2020-06-29 DIAGNOSIS — D689 Coagulation defect, unspecified: Secondary | ICD-10-CM | POA: Diagnosis not present

## 2020-06-29 DIAGNOSIS — N186 End stage renal disease: Secondary | ICD-10-CM | POA: Diagnosis not present

## 2020-06-29 DIAGNOSIS — E876 Hypokalemia: Secondary | ICD-10-CM | POA: Diagnosis not present

## 2020-06-29 DIAGNOSIS — Z992 Dependence on renal dialysis: Secondary | ICD-10-CM | POA: Diagnosis not present

## 2020-06-29 DIAGNOSIS — N2581 Secondary hyperparathyroidism of renal origin: Secondary | ICD-10-CM | POA: Diagnosis not present

## 2020-07-01 ENCOUNTER — Other Ambulatory Visit: Payer: Self-pay | Admitting: Cardiology

## 2020-07-01 DIAGNOSIS — E876 Hypokalemia: Secondary | ICD-10-CM | POA: Diagnosis not present

## 2020-07-01 DIAGNOSIS — D631 Anemia in chronic kidney disease: Secondary | ICD-10-CM | POA: Diagnosis not present

## 2020-07-01 DIAGNOSIS — D689 Coagulation defect, unspecified: Secondary | ICD-10-CM | POA: Diagnosis not present

## 2020-07-01 DIAGNOSIS — N2581 Secondary hyperparathyroidism of renal origin: Secondary | ICD-10-CM | POA: Diagnosis not present

## 2020-07-01 DIAGNOSIS — Z992 Dependence on renal dialysis: Secondary | ICD-10-CM | POA: Diagnosis not present

## 2020-07-01 DIAGNOSIS — N186 End stage renal disease: Secondary | ICD-10-CM | POA: Diagnosis not present

## 2020-07-02 DIAGNOSIS — I509 Heart failure, unspecified: Secondary | ICD-10-CM | POA: Diagnosis not present

## 2020-07-02 DIAGNOSIS — N186 End stage renal disease: Secondary | ICD-10-CM | POA: Diagnosis not present

## 2020-07-02 DIAGNOSIS — Z4902 Encounter for fitting and adjustment of peritoneal dialysis catheter: Secondary | ICD-10-CM | POA: Diagnosis not present

## 2020-07-02 DIAGNOSIS — E1122 Type 2 diabetes mellitus with diabetic chronic kidney disease: Secondary | ICD-10-CM | POA: Diagnosis not present

## 2020-07-02 DIAGNOSIS — N185 Chronic kidney disease, stage 5: Secondary | ICD-10-CM | POA: Diagnosis not present

## 2020-07-02 DIAGNOSIS — I5030 Unspecified diastolic (congestive) heart failure: Secondary | ICD-10-CM | POA: Diagnosis not present

## 2020-07-02 DIAGNOSIS — E11610 Type 2 diabetes mellitus with diabetic neuropathic arthropathy: Secondary | ICD-10-CM | POA: Diagnosis not present

## 2020-07-02 DIAGNOSIS — G4733 Obstructive sleep apnea (adult) (pediatric): Secondary | ICD-10-CM | POA: Diagnosis not present

## 2020-07-02 DIAGNOSIS — I2510 Atherosclerotic heart disease of native coronary artery without angina pectoris: Secondary | ICD-10-CM | POA: Diagnosis not present

## 2020-07-02 DIAGNOSIS — Z992 Dependence on renal dialysis: Secondary | ICD-10-CM | POA: Diagnosis not present

## 2020-07-02 DIAGNOSIS — Z9989 Dependence on other enabling machines and devices: Secondary | ICD-10-CM | POA: Diagnosis not present

## 2020-07-02 DIAGNOSIS — I120 Hypertensive chronic kidney disease with stage 5 chronic kidney disease or end stage renal disease: Secondary | ICD-10-CM | POA: Diagnosis not present

## 2020-07-02 DIAGNOSIS — I480 Paroxysmal atrial fibrillation: Secondary | ICD-10-CM | POA: Diagnosis not present

## 2020-07-02 DIAGNOSIS — E669 Obesity, unspecified: Secondary | ICD-10-CM | POA: Diagnosis not present

## 2020-07-03 ENCOUNTER — Other Ambulatory Visit: Payer: Self-pay | Admitting: Cardiology

## 2020-07-03 DIAGNOSIS — E876 Hypokalemia: Secondary | ICD-10-CM | POA: Diagnosis not present

## 2020-07-03 DIAGNOSIS — N186 End stage renal disease: Secondary | ICD-10-CM | POA: Diagnosis not present

## 2020-07-03 DIAGNOSIS — D689 Coagulation defect, unspecified: Secondary | ICD-10-CM | POA: Diagnosis not present

## 2020-07-03 DIAGNOSIS — N2581 Secondary hyperparathyroidism of renal origin: Secondary | ICD-10-CM | POA: Diagnosis not present

## 2020-07-03 DIAGNOSIS — D631 Anemia in chronic kidney disease: Secondary | ICD-10-CM | POA: Diagnosis not present

## 2020-07-03 DIAGNOSIS — Z992 Dependence on renal dialysis: Secondary | ICD-10-CM | POA: Diagnosis not present

## 2020-07-05 DIAGNOSIS — Z01812 Encounter for preprocedural laboratory examination: Secondary | ICD-10-CM | POA: Diagnosis not present

## 2020-07-05 DIAGNOSIS — Z4902 Encounter for fitting and adjustment of peritoneal dialysis catheter: Secondary | ICD-10-CM | POA: Diagnosis not present

## 2020-07-05 DIAGNOSIS — N184 Chronic kidney disease, stage 4 (severe): Secondary | ICD-10-CM | POA: Diagnosis not present

## 2020-07-05 DIAGNOSIS — N186 End stage renal disease: Secondary | ICD-10-CM | POA: Diagnosis not present

## 2020-07-05 DIAGNOSIS — E11610 Type 2 diabetes mellitus with diabetic neuropathic arthropathy: Secondary | ICD-10-CM | POA: Diagnosis not present

## 2020-07-05 DIAGNOSIS — I129 Hypertensive chronic kidney disease with stage 1 through stage 4 chronic kidney disease, or unspecified chronic kidney disease: Secondary | ICD-10-CM | POA: Diagnosis not present

## 2020-07-05 DIAGNOSIS — Z20822 Contact with and (suspected) exposure to covid-19: Secondary | ICD-10-CM | POA: Diagnosis not present

## 2020-07-05 DIAGNOSIS — E1122 Type 2 diabetes mellitus with diabetic chronic kidney disease: Secondary | ICD-10-CM | POA: Diagnosis not present

## 2020-07-05 DIAGNOSIS — I5030 Unspecified diastolic (congestive) heart failure: Secondary | ICD-10-CM | POA: Diagnosis not present

## 2020-07-05 DIAGNOSIS — I120 Hypertensive chronic kidney disease with stage 5 chronic kidney disease or end stage renal disease: Secondary | ICD-10-CM | POA: Diagnosis not present

## 2020-07-05 DIAGNOSIS — I480 Paroxysmal atrial fibrillation: Secondary | ICD-10-CM | POA: Diagnosis not present

## 2020-07-06 ENCOUNTER — Ambulatory Visit: Payer: Medicare Other

## 2020-07-06 DIAGNOSIS — N186 End stage renal disease: Secondary | ICD-10-CM | POA: Diagnosis not present

## 2020-07-06 DIAGNOSIS — E876 Hypokalemia: Secondary | ICD-10-CM | POA: Diagnosis not present

## 2020-07-06 DIAGNOSIS — D689 Coagulation defect, unspecified: Secondary | ICD-10-CM | POA: Diagnosis not present

## 2020-07-06 DIAGNOSIS — D631 Anemia in chronic kidney disease: Secondary | ICD-10-CM | POA: Diagnosis not present

## 2020-07-06 DIAGNOSIS — Z992 Dependence on renal dialysis: Secondary | ICD-10-CM | POA: Diagnosis not present

## 2020-07-06 DIAGNOSIS — N2581 Secondary hyperparathyroidism of renal origin: Secondary | ICD-10-CM | POA: Diagnosis not present

## 2020-07-07 DIAGNOSIS — E11610 Type 2 diabetes mellitus with diabetic neuropathic arthropathy: Secondary | ICD-10-CM | POA: Diagnosis not present

## 2020-07-07 DIAGNOSIS — N186 End stage renal disease: Secondary | ICD-10-CM | POA: Diagnosis not present

## 2020-07-07 DIAGNOSIS — I5030 Unspecified diastolic (congestive) heart failure: Secondary | ICD-10-CM | POA: Diagnosis not present

## 2020-07-07 DIAGNOSIS — E1122 Type 2 diabetes mellitus with diabetic chronic kidney disease: Secondary | ICD-10-CM | POA: Diagnosis not present

## 2020-07-07 DIAGNOSIS — I480 Paroxysmal atrial fibrillation: Secondary | ICD-10-CM | POA: Diagnosis not present

## 2020-07-07 DIAGNOSIS — I120 Hypertensive chronic kidney disease with stage 5 chronic kidney disease or end stage renal disease: Secondary | ICD-10-CM | POA: Diagnosis not present

## 2020-07-08 DIAGNOSIS — E113213 Type 2 diabetes mellitus with mild nonproliferative diabetic retinopathy with macular edema, bilateral: Secondary | ICD-10-CM | POA: Diagnosis not present

## 2020-07-08 DIAGNOSIS — D631 Anemia in chronic kidney disease: Secondary | ICD-10-CM | POA: Diagnosis not present

## 2020-07-08 DIAGNOSIS — Z992 Dependence on renal dialysis: Secondary | ICD-10-CM | POA: Diagnosis not present

## 2020-07-08 DIAGNOSIS — N186 End stage renal disease: Secondary | ICD-10-CM | POA: Diagnosis not present

## 2020-07-08 DIAGNOSIS — E876 Hypokalemia: Secondary | ICD-10-CM | POA: Diagnosis not present

## 2020-07-08 DIAGNOSIS — N2581 Secondary hyperparathyroidism of renal origin: Secondary | ICD-10-CM | POA: Diagnosis not present

## 2020-07-08 DIAGNOSIS — D689 Coagulation defect, unspecified: Secondary | ICD-10-CM | POA: Diagnosis not present

## 2020-07-08 NOTE — Assessment & Plan Note (Signed)
Continue regimen as below.  Pt frequently adjust insulin due to self-glucose testing results and uses a Sliding Scale.

## 2020-07-09 ENCOUNTER — Ambulatory Visit: Payer: Medicare Other

## 2020-07-09 DIAGNOSIS — E11610 Type 2 diabetes mellitus with diabetic neuropathic arthropathy: Secondary | ICD-10-CM | POA: Diagnosis not present

## 2020-07-09 DIAGNOSIS — I5030 Unspecified diastolic (congestive) heart failure: Secondary | ICD-10-CM | POA: Diagnosis not present

## 2020-07-09 DIAGNOSIS — E1122 Type 2 diabetes mellitus with diabetic chronic kidney disease: Secondary | ICD-10-CM | POA: Diagnosis not present

## 2020-07-09 DIAGNOSIS — I480 Paroxysmal atrial fibrillation: Secondary | ICD-10-CM | POA: Diagnosis not present

## 2020-07-09 DIAGNOSIS — N186 End stage renal disease: Secondary | ICD-10-CM | POA: Diagnosis not present

## 2020-07-09 DIAGNOSIS — I120 Hypertensive chronic kidney disease with stage 5 chronic kidney disease or end stage renal disease: Secondary | ICD-10-CM | POA: Diagnosis not present

## 2020-07-10 DIAGNOSIS — D631 Anemia in chronic kidney disease: Secondary | ICD-10-CM | POA: Diagnosis not present

## 2020-07-10 DIAGNOSIS — N186 End stage renal disease: Secondary | ICD-10-CM | POA: Diagnosis not present

## 2020-07-10 DIAGNOSIS — Z992 Dependence on renal dialysis: Secondary | ICD-10-CM | POA: Diagnosis not present

## 2020-07-10 DIAGNOSIS — N2581 Secondary hyperparathyroidism of renal origin: Secondary | ICD-10-CM | POA: Diagnosis not present

## 2020-07-10 DIAGNOSIS — E876 Hypokalemia: Secondary | ICD-10-CM | POA: Diagnosis not present

## 2020-07-10 DIAGNOSIS — D689 Coagulation defect, unspecified: Secondary | ICD-10-CM | POA: Diagnosis not present

## 2020-07-12 ENCOUNTER — Telehealth: Payer: Self-pay

## 2020-07-12 DIAGNOSIS — R739 Hyperglycemia, unspecified: Secondary | ICD-10-CM | POA: Diagnosis not present

## 2020-07-12 DIAGNOSIS — I2510 Atherosclerotic heart disease of native coronary artery without angina pectoris: Secondary | ICD-10-CM | POA: Diagnosis present

## 2020-07-12 DIAGNOSIS — N185 Chronic kidney disease, stage 5: Secondary | ICD-10-CM | POA: Diagnosis not present

## 2020-07-12 DIAGNOSIS — Z841 Family history of disorders of kidney and ureter: Secondary | ICD-10-CM | POA: Diagnosis not present

## 2020-07-12 DIAGNOSIS — E1122 Type 2 diabetes mellitus with diabetic chronic kidney disease: Secondary | ICD-10-CM | POA: Diagnosis present

## 2020-07-12 DIAGNOSIS — E875 Hyperkalemia: Secondary | ICD-10-CM | POA: Diagnosis present

## 2020-07-12 DIAGNOSIS — G4733 Obstructive sleep apnea (adult) (pediatric): Secondary | ICD-10-CM | POA: Diagnosis present

## 2020-07-12 DIAGNOSIS — Z9181 History of falling: Secondary | ICD-10-CM | POA: Diagnosis not present

## 2020-07-12 DIAGNOSIS — D631 Anemia in chronic kidney disease: Secondary | ICD-10-CM | POA: Diagnosis present

## 2020-07-12 DIAGNOSIS — C50929 Malignant neoplasm of unspecified site of unspecified male breast: Secondary | ICD-10-CM | POA: Diagnosis not present

## 2020-07-12 DIAGNOSIS — Z992 Dependence on renal dialysis: Secondary | ICD-10-CM | POA: Diagnosis not present

## 2020-07-12 DIAGNOSIS — I4821 Permanent atrial fibrillation: Secondary | ICD-10-CM | POA: Diagnosis present

## 2020-07-12 DIAGNOSIS — E669 Obesity, unspecified: Secondary | ICD-10-CM | POA: Diagnosis present

## 2020-07-12 DIAGNOSIS — Z87891 Personal history of nicotine dependence: Secondary | ICD-10-CM | POA: Diagnosis not present

## 2020-07-12 DIAGNOSIS — Z794 Long term (current) use of insulin: Secondary | ICD-10-CM | POA: Diagnosis not present

## 2020-07-12 DIAGNOSIS — Z6833 Body mass index (BMI) 33.0-33.9, adult: Secondary | ICD-10-CM | POA: Diagnosis not present

## 2020-07-12 DIAGNOSIS — Z823 Family history of stroke: Secondary | ICD-10-CM | POA: Diagnosis not present

## 2020-07-12 DIAGNOSIS — I5032 Chronic diastolic (congestive) heart failure: Secondary | ICD-10-CM | POA: Diagnosis present

## 2020-07-12 DIAGNOSIS — Z8249 Family history of ischemic heart disease and other diseases of the circulatory system: Secondary | ICD-10-CM | POA: Diagnosis not present

## 2020-07-12 DIAGNOSIS — Z885 Allergy status to narcotic agent status: Secondary | ICD-10-CM | POA: Diagnosis not present

## 2020-07-12 DIAGNOSIS — M109 Gout, unspecified: Secondary | ICD-10-CM | POA: Diagnosis present

## 2020-07-12 DIAGNOSIS — Z833 Family history of diabetes mellitus: Secondary | ICD-10-CM | POA: Diagnosis not present

## 2020-07-12 DIAGNOSIS — I132 Hypertensive heart and chronic kidney disease with heart failure and with stage 5 chronic kidney disease, or end stage renal disease: Secondary | ICD-10-CM | POA: Diagnosis not present

## 2020-07-12 DIAGNOSIS — N179 Acute kidney failure, unspecified: Secondary | ICD-10-CM | POA: Diagnosis not present

## 2020-07-12 DIAGNOSIS — Z20822 Contact with and (suspected) exposure to covid-19: Secondary | ICD-10-CM | POA: Diagnosis present

## 2020-07-12 DIAGNOSIS — Z888 Allergy status to other drugs, medicaments and biological substances status: Secondary | ICD-10-CM | POA: Diagnosis not present

## 2020-07-12 DIAGNOSIS — Z4902 Encounter for fitting and adjustment of peritoneal dialysis catheter: Secondary | ICD-10-CM | POA: Diagnosis not present

## 2020-07-12 DIAGNOSIS — N186 End stage renal disease: Secondary | ICD-10-CM | POA: Diagnosis present

## 2020-07-12 DIAGNOSIS — E11319 Type 2 diabetes mellitus with unspecified diabetic retinopathy without macular edema: Secondary | ICD-10-CM | POA: Diagnosis not present

## 2020-07-12 NOTE — Telephone Encounter (Signed)
I completed the forms last week.

## 2020-07-12 NOTE — Telephone Encounter (Signed)
[place] called about the forms for the [facility]. They will need the form signed along with an office note.   Fax [phone]

## 2020-07-13 ENCOUNTER — Ambulatory Visit: Payer: Medicare Other | Admitting: Family Medicine

## 2020-07-13 DIAGNOSIS — I4821 Permanent atrial fibrillation: Secondary | ICD-10-CM | POA: Diagnosis not present

## 2020-07-13 DIAGNOSIS — Z4902 Encounter for fitting and adjustment of peritoneal dialysis catheter: Secondary | ICD-10-CM | POA: Diagnosis not present

## 2020-07-13 DIAGNOSIS — E1122 Type 2 diabetes mellitus with diabetic chronic kidney disease: Secondary | ICD-10-CM | POA: Diagnosis not present

## 2020-07-13 DIAGNOSIS — N185 Chronic kidney disease, stage 5: Secondary | ICD-10-CM | POA: Diagnosis not present

## 2020-07-13 DIAGNOSIS — Z992 Dependence on renal dialysis: Secondary | ICD-10-CM | POA: Diagnosis not present

## 2020-07-13 DIAGNOSIS — Z20822 Contact with and (suspected) exposure to covid-19: Secondary | ICD-10-CM | POA: Diagnosis not present

## 2020-07-13 DIAGNOSIS — I5032 Chronic diastolic (congestive) heart failure: Secondary | ICD-10-CM | POA: Diagnosis not present

## 2020-07-13 DIAGNOSIS — E875 Hyperkalemia: Secondary | ICD-10-CM | POA: Diagnosis not present

## 2020-07-13 DIAGNOSIS — N186 End stage renal disease: Secondary | ICD-10-CM | POA: Diagnosis not present

## 2020-07-13 DIAGNOSIS — R739 Hyperglycemia, unspecified: Secondary | ICD-10-CM | POA: Diagnosis not present

## 2020-07-13 DIAGNOSIS — I132 Hypertensive heart and chronic kidney disease with heart failure and with stage 5 chronic kidney disease, or end stage renal disease: Secondary | ICD-10-CM | POA: Diagnosis not present

## 2020-07-13 DIAGNOSIS — Z794 Long term (current) use of insulin: Secondary | ICD-10-CM | POA: Diagnosis not present

## 2020-07-14 ENCOUNTER — Telehealth: Payer: Self-pay

## 2020-07-14 DIAGNOSIS — T7840XA Allergy, unspecified, initial encounter: Secondary | ICD-10-CM | POA: Insufficient documentation

## 2020-07-14 DIAGNOSIS — I120 Hypertensive chronic kidney disease with stage 5 chronic kidney disease or end stage renal disease: Secondary | ICD-10-CM | POA: Diagnosis not present

## 2020-07-14 DIAGNOSIS — I5030 Unspecified diastolic (congestive) heart failure: Secondary | ICD-10-CM | POA: Diagnosis not present

## 2020-07-14 DIAGNOSIS — N186 End stage renal disease: Secondary | ICD-10-CM | POA: Diagnosis not present

## 2020-07-14 DIAGNOSIS — I480 Paroxysmal atrial fibrillation: Secondary | ICD-10-CM | POA: Diagnosis not present

## 2020-07-14 DIAGNOSIS — E1122 Type 2 diabetes mellitus with diabetic chronic kidney disease: Secondary | ICD-10-CM | POA: Diagnosis not present

## 2020-07-14 DIAGNOSIS — E11610 Type 2 diabetes mellitus with diabetic neuropathic arthropathy: Secondary | ICD-10-CM | POA: Diagnosis not present

## 2020-07-14 NOTE — Telephone Encounter (Signed)
Nyneka from [facility] called requesting VO for PT for 2 times a week for 2 weeks  VO given, FYI to PCP   [place] # [phone]

## 2020-07-14 NOTE — Telephone Encounter (Signed)
Faxed it again today.

## 2020-07-14 NOTE — Telephone Encounter (Signed)
Agree with documentation as above.   Refugia Laneve, MD  

## 2020-07-15 DIAGNOSIS — D631 Anemia in chronic kidney disease: Secondary | ICD-10-CM | POA: Diagnosis not present

## 2020-07-15 DIAGNOSIS — Z992 Dependence on renal dialysis: Secondary | ICD-10-CM | POA: Diagnosis not present

## 2020-07-15 DIAGNOSIS — N186 End stage renal disease: Secondary | ICD-10-CM | POA: Diagnosis not present

## 2020-07-15 DIAGNOSIS — D689 Coagulation defect, unspecified: Secondary | ICD-10-CM | POA: Diagnosis not present

## 2020-07-15 DIAGNOSIS — N2581 Secondary hyperparathyroidism of renal origin: Secondary | ICD-10-CM | POA: Diagnosis not present

## 2020-07-15 DIAGNOSIS — E876 Hypokalemia: Secondary | ICD-10-CM | POA: Diagnosis not present

## 2020-07-16 DIAGNOSIS — I120 Hypertensive chronic kidney disease with stage 5 chronic kidney disease or end stage renal disease: Secondary | ICD-10-CM | POA: Diagnosis not present

## 2020-07-16 DIAGNOSIS — I480 Paroxysmal atrial fibrillation: Secondary | ICD-10-CM | POA: Diagnosis not present

## 2020-07-16 DIAGNOSIS — N186 End stage renal disease: Secondary | ICD-10-CM | POA: Diagnosis not present

## 2020-07-16 DIAGNOSIS — I5030 Unspecified diastolic (congestive) heart failure: Secondary | ICD-10-CM | POA: Diagnosis not present

## 2020-07-16 DIAGNOSIS — E11610 Type 2 diabetes mellitus with diabetic neuropathic arthropathy: Secondary | ICD-10-CM | POA: Diagnosis not present

## 2020-07-16 DIAGNOSIS — E1122 Type 2 diabetes mellitus with diabetic chronic kidney disease: Secondary | ICD-10-CM | POA: Diagnosis not present

## 2020-07-17 DIAGNOSIS — N2581 Secondary hyperparathyroidism of renal origin: Secondary | ICD-10-CM | POA: Diagnosis not present

## 2020-07-17 DIAGNOSIS — Z992 Dependence on renal dialysis: Secondary | ICD-10-CM | POA: Diagnosis not present

## 2020-07-17 DIAGNOSIS — E876 Hypokalemia: Secondary | ICD-10-CM | POA: Diagnosis not present

## 2020-07-17 DIAGNOSIS — D689 Coagulation defect, unspecified: Secondary | ICD-10-CM | POA: Diagnosis not present

## 2020-07-17 DIAGNOSIS — N186 End stage renal disease: Secondary | ICD-10-CM | POA: Diagnosis not present

## 2020-07-17 DIAGNOSIS — D631 Anemia in chronic kidney disease: Secondary | ICD-10-CM | POA: Diagnosis not present

## 2020-07-19 ENCOUNTER — Ambulatory Visit (INDEPENDENT_AMBULATORY_CARE_PROVIDER_SITE_OTHER): Payer: Medicare Other | Admitting: Family Medicine

## 2020-07-19 ENCOUNTER — Encounter: Payer: Self-pay | Admitting: Family Medicine

## 2020-07-19 ENCOUNTER — Ambulatory Visit: Payer: Self-pay | Admitting: *Deleted

## 2020-07-19 VITALS — BP 119/46 | HR 64 | Ht 71.0 in | Wt 223.0 lb

## 2020-07-19 DIAGNOSIS — I2510 Atherosclerotic heart disease of native coronary artery without angina pectoris: Secondary | ICD-10-CM | POA: Diagnosis not present

## 2020-07-19 DIAGNOSIS — I4811 Longstanding persistent atrial fibrillation: Secondary | ICD-10-CM

## 2020-07-19 DIAGNOSIS — N186 End stage renal disease: Secondary | ICD-10-CM

## 2020-07-19 DIAGNOSIS — E1142 Type 2 diabetes mellitus with diabetic polyneuropathy: Secondary | ICD-10-CM | POA: Diagnosis not present

## 2020-07-19 DIAGNOSIS — E11610 Type 2 diabetes mellitus with diabetic neuropathic arthropathy: Secondary | ICD-10-CM | POA: Diagnosis not present

## 2020-07-19 DIAGNOSIS — I120 Hypertensive chronic kidney disease with stage 5 chronic kidney disease or end stage renal disease: Secondary | ICD-10-CM | POA: Diagnosis not present

## 2020-07-19 DIAGNOSIS — I480 Paroxysmal atrial fibrillation: Secondary | ICD-10-CM | POA: Diagnosis not present

## 2020-07-19 DIAGNOSIS — I5030 Unspecified diastolic (congestive) heart failure: Secondary | ICD-10-CM | POA: Diagnosis not present

## 2020-07-19 DIAGNOSIS — E1122 Type 2 diabetes mellitus with diabetic chronic kidney disease: Secondary | ICD-10-CM | POA: Diagnosis not present

## 2020-07-19 LAB — POCT INR: INR: 1.1 — AB (ref 2.0–3.0)

## 2020-07-19 LAB — POCT GLYCOSYLATED HEMOGLOBIN (HGB A1C): Hemoglobin A1C: 6.4 % — AB (ref 4.0–5.6)

## 2020-07-19 NOTE — Patient Instructions (Addendum)
Take 1/5 tab of the Amlodipine in the morning.  Take 5 mg of Coumadin M/W/F/Saturday. And 2.5 mg all other days and f/u in 1 week for INR check.

## 2020-07-19 NOTE — Progress Notes (Signed)
His coumadin was d/c'd for 1 week. He restarted this on 07/12/20.  He is taking 5 mg m/w/saturday and 2.5 mg all other days.

## 2020-07-19 NOTE — Assessment & Plan Note (Signed)
Back on hemodialysis.  Not a candidate for renal transplant.  Considering fistula placement.

## 2020-07-19 NOTE — Assessment & Plan Note (Signed)
Hemoglobin A1c looks phenomenal today at 6.4 did encourage him and his wife to monitor for any hypoglycemic episodes.  Now that his glucose seems to be much better regulated off of peritoneal dialysis I suspect he will start to have some low blood sugars and that his regimen will need to be adjusted but right now he is actually doing well.

## 2020-07-19 NOTE — Progress Notes (Signed)
Established Patient Office Visit  Subjective:  Patient ID: Louis [name], male    DOB: March 18, 1946  Age: 74 y.o. MRN: [id number]  CC:  Chief Complaint  Patient presents with  . Diabetes  . Coagulation Disorder    HPI Louis [name] presents for Diabetes      He was hyperkalemic in the hospital so they actually discontinued his potassium.  As far as his renal failure is concerned he is decided to transition off of peritoneal dialysis because of his wife's health conditions she was not sure she was going to be able to help him while she was having her cancer actively treated he actually just had the peritoneal catheter removed.  They did hold his Coumadin for 1 week but he has restarted it.  He has been back on his regimen for about 3 days with his Coumadin.  He is now on dialysis on Tuesdays, Thursdays and Saturdays.  He and his wife are actually planning on traveling to the mountains this weekend.  They are now talking about possibly doing surgery for a fistula and so he had some questions about that especially with being on a blood thinner.  Diabetes-he is doing 30 units of insulin in twice daily.  She said she did have one night where his blood sugar was a little bit low before bedtime so she only gave 25 units but otherwise has been doing well on it and feels like his sugars have been much better regulated since he switched back to traditional dialysis and off of peritoneal dialysis.  Past Medical History:  Diagnosis Date  . Brittle bones    per pt, has soft bones in right foot/wears boot cast!  . CAD (coronary artery disease)   . Cataract    Bil/ surg scheduled for right eye 01/18/17/ left eye 02/08/17  . Charcot ankle, right 2019  . CHF (congestive heart failure) ([place]) 2015  . Diabetes mellitus   . Heart failure, diastolic ([place])   . Hyperlipidemia   . Hypertension   . Macular edema 2014  . OSA on CPAP   . Paroxysmal atrial fibrillation (HCC)   . Personal history of  colonic polyps - adenomas 01/28/2014  . Renal insufficiency    being examined for dialysis for home dialysis [place]  . Shortness of breath dyspnea   . Syncope and collapse     Past Surgical History:  Procedure Laterality Date  . CARPAL TUNNEL RELEASE     left hand  . COLONOSCOPY    . INTRAVASCULAR PRESSURE WIRE/FFR STUDY N/A 12/18/2018   Procedure: INTRAVASCULAR PRESSURE WIRE/FFR STUDY;  Surgeon: Wellington Hampshire, MD;  Location: [facility];  Service: Cardiovascular;  Laterality: N/A;  . IR FLUORO GUIDE CV LINE RIGHT  12/16/2018  . IR US GUIDE VASC ACCESS RIGHT  12/16/2018  . KNEE ARTHROSCOPY Right 09/13/2016   [facility], Dr. Dorna Leitz  . PILONIDAL CYST EXCISION    . RIGHT/LEFT HEART CATH AND CORONARY ANGIOGRAPHY N/A 12/18/2018   Procedure: RIGHT/LEFT HEART CATH AND CORONARY ANGIOGRAPHY;  Surgeon: Wellington Hampshire, MD;  Location: [facility];  Service: Cardiovascular;  Laterality: N/A;    Family History  Problem Relation Age of Onset  . Diabetes Mother   . Kidney disease Mother   . Diabetes Father   . Coronary artery disease Father   . Diabetes Brother   . Diabetes Sister   . Prostate cancer Maternal Uncle   . Diabetes Maternal Grandmother   .  Cancer Paternal Grandmother        unknown  . Heart attack Paternal Grandfather   . Colon cancer Neg Hx     Social History   Socioeconomic History  . Marital status: Married    Spouse name: [name]  . Number of children: 5  . Years of education: 53  . Highest education level: 12th grade  Occupational History  . Occupation: Warehouse    Comment: retired  Tobacco Use  . Smoking status: Former Smoker    Packs/day: 0.50    Years: 20.00    Pack years: 10.00    Types: Cigarettes    Quit date: 03/06/1977    Years since quitting: 43.4  . Smokeless tobacco: Never Used  Vaping Use  . Vaping Use: Never used  Substance and Sexual Activity  . Alcohol use: No  . Drug use: No  . Sexual activity: Not  Currently  Other Topics Concern  . Not on file  Social History Narrative   Drinks a cup of coffee daily. Drinks a lot of water daily. Goes out with church members during the week   Social Determinants of Health   Financial Resource Strain:   . Difficulty of Paying Living Expenses: Not on file  Food Insecurity:   . Worried About Charity fundraiser in the Last Year: Not on file  . Ran Out of Food in the Last Year: Not on file  Transportation Needs:   . Lack of Transportation (Medical): Not on file  . Lack of Transportation (Non-Medical): Not on file  Physical Activity:   . Days of Exercise per Week: Not on file  . Minutes of Exercise per Session: Not on file  Stress:   . Feeling of Stress : Not on file  Social Connections:   . Frequency of Communication with Friends and Family: Not on file  . Frequency of Social Gatherings with Friends and Family: Not on file  . Attends Religious Services: Not on file  . Active Member of Clubs or Organizations: Not on file  . Attends Archivist Meetings: Not on file  . Marital Status: Not on file  Intimate Partner Violence:   . Fear of Current or Ex-Partner: Not on file  . Emotionally Abused: Not on file  . Physically Abused: Not on file  . Sexually Abused: Not on file    Outpatient Medications Prior to Visit  Medication Sig Dispense Refill  . allopurinol (ZYLOPRIM) 100 MG tablet TAKE 1 TO 1 & 1/2 (ONE & ONE-HALF) TABLETS BY MOUTH TWICE DAILY 270 tablet 1  . AMBULATORY NON FORMULARY MEDICATION Continuous positive airway pressure (CPAP) device: Auto titrate minimum 4 cm H20 to maximum of 20 cm H2O with pressure. Please provide all supplemental supplies as needed. Fax to: [phone] 1 Units 1  . AMBULATORY NON FORMULARY MEDICATION Shower chair - Dx: M25.561, M54.12, M10.00 - Fax to - [phone] 1 each 0  . AMBULATORY NON FORMULARY MEDICATION Rollator walker with seat. Dx: M25.561, M54.12, M10.00 - Fax to - [phone] 1 each 0  .  amitriptyline (ELAVIL) 25 MG tablet Take 12.5 mg by mouth every evening.    Marland Kitchen amLODipine (NORVASC) 10 MG tablet Take 1 tablet (10 mg total) by mouth daily. Pt is taking this in the morning 90 tablet 0  . B-D INS SYR ULTRAFINE 1CC/31G 31G X 5/16" 1 ML MISC     . blood glucose meter kit and supplies Use to check blood sugars daily E11.65    .  Cholecalciferol (VITAMIN D3) 5000 units CAPS Take 5,000 Units by mouth daily.    . Coenzyme Q10 (CO Q 10 PO) Take 1 tablet by mouth daily.    . Continuous Blood Gluc Receiver (FREESTYLE LIBRE 14 DAY READER) DEVI Dx DM E11.22 Check blood sugar 4 times daily. 1 each prn  . Continuous Blood Gluc Sensor (FREESTYLE LIBRE 14 DAY SENSOR) MISC Dx DM E11.22 Check blood sugar 4 times daily. 1 each prn  . furosemide (LASIX) 80 MG tablet Take 160 mg by mouth 2 (two) times daily.     Marland Kitchen gabapentin (NEURONTIN) 300 MG capsule Take 1 capsule by mouth once daily 90 capsule 1  . hydrALAZINE (APRESOLINE) 100 MG tablet Take 1 tablet (100 mg total) by mouth 3 (three) times daily. 270 tablet 3  . isosorbide dinitrate (ISORDIL) 20 MG tablet Take 1 tablet by mouth twice daily 60 tablet 1  . metolazone (ZAROXOLYN) 5 MG tablet Take 5 mg by mouth as needed.    . nitroGLYCERIN (NITROSTAT) 0.4 MG SL tablet Place 1 tablet (0.4 mg total) under the tongue every 5 (five) minutes as needed for chest pain. 25 tablet 0  . NOVOLIN N 100 UNIT/ML injection Inject 32 Units into the skin 2 (two) times daily before a meal.     . NOVOLIN R 100 UNIT/ML injection Inject 5-20 Units into the skin 3 (three) times daily with meals.   5  . ONETOUCH ULTRA test strip USE 1 STRIP TO CHECK GLUCOSE 3 TIMES DAILY 300 each 0  . rosuvastatin (CRESTOR) 40 MG tablet Take 1 tablet (40 mg total) by mouth daily. 90 tablet 3  . VITAMIN E PO Take 1 capsule by mouth daily.    Marland Kitchen warfarin (COUMADIN) 5 MG tablet TAKE 1 TABLET BY MOUTH ONCE DAILY AT 6 PM MONITOR INR GOAL SHOULD BE BETWEEN 2-3. (Patient taking differently: Take 5  mg by mouth daily. Take 66m Monday, Wednesday, and Saturday.  Take 2.561mon Tuesday, Thursday, Friday, and sunday) 90 tablet 0  . zinc sulfate 220 (50 Zn) MG capsule Take 220 mg by mouth daily.    . Marland Kitchenmitriptyline (ELAVIL) 25 MG tablet Take 1-2 tablets (25-50 mg total) by mouth at bedtime. (Patient taking differently: Take 12.5 mg by mouth at bedtime. Taking 12.5 mg at bedtime) 60 tablet 1  . gentamicin cream (GARAMYCIN) 0.1 % APPLY TO EXIT SITE DAILY    . heparin 1000 UNIT/ML injection 500 Units.     . Potassium Chloride ER 20 MEQ TBCR Take 40 mEq by mouth daily.     . Transparent Dressings (TEGADERM FIRST AID STYLE) MISC To be used with changing of peritoneal dialysis. Dx z99.2 [place]ove [phone] each prn   No facility-administered medications prior to visit.    Allergies  Allergen Reactions  . Hydrocodone Nausea And Vomiting    Other reaction(s): GI Upset (intolerance) Projectile vomiting Projectile vomiting  . Oxycodone Nausea And Vomiting    Other reaction(s): GI Upset (intolerance), Vomiting (intolerance) Projectile vomiting Projectile vomiting  . Dacarbazine Other (See Comments)    Other reaction(s): Unknown  . Tape Dermatitis, Itching and Rash    Patch used at dialysis    ROS Review of Systems    Objective:    Physical Exam Constitutional:      Appearance: He is well-developed.  HENT:     Head: Normocephalic and atraumatic.  Cardiovascular:     Rate and Rhythm: Normal rate and regular rhythm.     Heart sounds: Normal  heart sounds.  Pulmonary:     Effort: Pulmonary effort is normal.     Breath sounds: Normal breath sounds.  Musculoskeletal:     Comments: Contractures of both hands.  Skin:    General: Skin is warm and dry.  Neurological:     Mental Status: He is alert and oriented to person, place, and time.  Psychiatric:        Behavior: Behavior normal.     BP (!) 119/46   Pulse 64   Ht _0  (1.803 m)   Wt 223 lb (101.2 kg)   SpO2 99%   BMI  31.10 kg/m  Wt Readings from Last 3 Encounters:  07/19/20 223 lb (101.2 kg)  05/26/20 232 lb (105.2 kg)  05/25/20 231 lb (104.8 kg)     There are no preventive care reminders to display for this patient.  There are no preventive care reminders to display for this patient.  Lab Results  Component Value Date   TSH 2.391 02/02/2017   Lab Results  Component Value Date   WBC 7.5 03/08/2020   HGB 15.2 03/08/2020   HCT 46.8 03/08/2020   MCV 91.4 03/08/2020   PLT 234 03/08/2020   Lab Results  Component Value Date   NA 139 10/27/2019   K 3.0 (L) 10/27/2019   CO2 27 10/27/2019   GLUCOSE 79 10/27/2019   BUN 43 (H) 10/27/2019   CREATININE 3.66 (H) 10/27/2019   BILITOT 0.8 05/28/2020   ALKPHOS 105 10/27/2019   AST 23 05/28/2020   ALT 17 05/28/2020   PROT 6.2 05/28/2020   ALBUMIN 3.1 (L) 10/27/2019   CALCIUM 9.2 10/27/2019   ANIONGAP 13 10/27/2019   GFR 38.90 (L) 03/10/2011   Lab Results  Component Value Date   CHOL 118 05/28/2020   Lab Results  Component Value Date   HDL 38 (L) 05/28/2020   Lab Results  Component Value Date   LDLCALC 56 05/28/2020   Lab Results  Component Value Date   TRIG 161 (H) 05/28/2020   Lab Results  Component Value Date   CHOLHDL 3.1 05/28/2020   Lab Results  Component Value Date   HGBA1C 6.4 (A) 07/19/2020      Assessment & Plan:   Problem List Items Addressed This Visit      Cardiovascular and Mediastinum   Atrial fibrillation (HCC)    INR subtherapeutic.  Increase Coumadin to 5 mg 4 days a week and 2.5 mg 3 days a week.  Recommend recheck in 7 to 10 days.      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  Now that his glucose seems to be much better regulated off of peritoneal dialysis I suspect he will start to have some low blood sugars and that his regimen will need to be adjusted but right now he is actually doing well.      Relevant Medications   amitriptyline (ELAVIL) 25 MG tablet     Genitourinary   ESRD (end stage renal disease) ([place])    Back on hemodialysis.  Not  a candidate for renal transplant.  Considering fistula placement.         No orders of the defined types were placed in this encounter.   Follow-up: Return in about 2 months (around 09/18/2020) for dm.    [name], MD

## 2020-07-19 NOTE — Assessment & Plan Note (Signed)
INR subtherapeutic.  Increase Coumadin to 5 mg 4 days a week and 2.5 mg 3 days a week.  Recommend recheck in 7 to 10 days.

## 2020-07-20 DIAGNOSIS — E876 Hypokalemia: Secondary | ICD-10-CM | POA: Diagnosis not present

## 2020-07-20 DIAGNOSIS — D631 Anemia in chronic kidney disease: Secondary | ICD-10-CM | POA: Diagnosis not present

## 2020-07-20 DIAGNOSIS — N186 End stage renal disease: Secondary | ICD-10-CM | POA: Diagnosis not present

## 2020-07-20 DIAGNOSIS — N2581 Secondary hyperparathyroidism of renal origin: Secondary | ICD-10-CM | POA: Diagnosis not present

## 2020-07-20 DIAGNOSIS — Z992 Dependence on renal dialysis: Secondary | ICD-10-CM | POA: Diagnosis not present

## 2020-07-20 DIAGNOSIS — D689 Coagulation defect, unspecified: Secondary | ICD-10-CM | POA: Diagnosis not present

## 2020-07-21 DIAGNOSIS — E1122 Type 2 diabetes mellitus with diabetic chronic kidney disease: Secondary | ICD-10-CM | POA: Diagnosis not present

## 2020-07-21 DIAGNOSIS — D689 Coagulation defect, unspecified: Secondary | ICD-10-CM | POA: Diagnosis not present

## 2020-07-21 DIAGNOSIS — I5030 Unspecified diastolic (congestive) heart failure: Secondary | ICD-10-CM | POA: Diagnosis not present

## 2020-07-21 DIAGNOSIS — N186 End stage renal disease: Secondary | ICD-10-CM | POA: Diagnosis not present

## 2020-07-21 DIAGNOSIS — N2581 Secondary hyperparathyroidism of renal origin: Secondary | ICD-10-CM | POA: Diagnosis not present

## 2020-07-21 DIAGNOSIS — I120 Hypertensive chronic kidney disease with stage 5 chronic kidney disease or end stage renal disease: Secondary | ICD-10-CM | POA: Diagnosis not present

## 2020-07-21 DIAGNOSIS — E876 Hypokalemia: Secondary | ICD-10-CM | POA: Diagnosis not present

## 2020-07-21 DIAGNOSIS — Z992 Dependence on renal dialysis: Secondary | ICD-10-CM | POA: Diagnosis not present

## 2020-07-21 DIAGNOSIS — I480 Paroxysmal atrial fibrillation: Secondary | ICD-10-CM | POA: Diagnosis not present

## 2020-07-21 DIAGNOSIS — D631 Anemia in chronic kidney disease: Secondary | ICD-10-CM | POA: Diagnosis not present

## 2020-07-21 DIAGNOSIS — E11610 Type 2 diabetes mellitus with diabetic neuropathic arthropathy: Secondary | ICD-10-CM | POA: Diagnosis not present

## 2020-07-22 DIAGNOSIS — D689 Coagulation defect, unspecified: Secondary | ICD-10-CM | POA: Diagnosis not present

## 2020-07-22 DIAGNOSIS — Z992 Dependence on renal dialysis: Secondary | ICD-10-CM | POA: Diagnosis not present

## 2020-07-22 DIAGNOSIS — E1122 Type 2 diabetes mellitus with diabetic chronic kidney disease: Secondary | ICD-10-CM | POA: Diagnosis not present

## 2020-07-22 DIAGNOSIS — N2581 Secondary hyperparathyroidism of renal origin: Secondary | ICD-10-CM | POA: Diagnosis not present

## 2020-07-22 DIAGNOSIS — D631 Anemia in chronic kidney disease: Secondary | ICD-10-CM | POA: Diagnosis not present

## 2020-07-22 DIAGNOSIS — N186 End stage renal disease: Secondary | ICD-10-CM | POA: Diagnosis not present

## 2020-07-22 DIAGNOSIS — D509 Iron deficiency anemia, unspecified: Secondary | ICD-10-CM | POA: Diagnosis not present

## 2020-07-22 DIAGNOSIS — E876 Hypokalemia: Secondary | ICD-10-CM | POA: Diagnosis not present

## 2020-07-23 ENCOUNTER — Encounter: Payer: Self-pay | Admitting: Family Medicine

## 2020-07-23 MED ORDER — AMLODIPINE BESYLATE 2.5 MG PO TABS
2.5000 mg | ORAL_TABLET | Freq: Every day | ORAL | 0 refills | Status: DC
Start: 2020-07-23 — End: 2020-10-29

## 2020-07-24 DIAGNOSIS — N186 End stage renal disease: Secondary | ICD-10-CM | POA: Diagnosis not present

## 2020-07-24 DIAGNOSIS — E876 Hypokalemia: Secondary | ICD-10-CM | POA: Diagnosis not present

## 2020-07-24 DIAGNOSIS — D631 Anemia in chronic kidney disease: Secondary | ICD-10-CM | POA: Diagnosis not present

## 2020-07-24 DIAGNOSIS — Z992 Dependence on renal dialysis: Secondary | ICD-10-CM | POA: Diagnosis not present

## 2020-07-24 DIAGNOSIS — N2581 Secondary hyperparathyroidism of renal origin: Secondary | ICD-10-CM | POA: Diagnosis not present

## 2020-07-24 DIAGNOSIS — D689 Coagulation defect, unspecified: Secondary | ICD-10-CM | POA: Diagnosis not present

## 2020-07-27 DIAGNOSIS — E876 Hypokalemia: Secondary | ICD-10-CM | POA: Diagnosis not present

## 2020-07-27 DIAGNOSIS — Z992 Dependence on renal dialysis: Secondary | ICD-10-CM | POA: Diagnosis not present

## 2020-07-27 DIAGNOSIS — N2581 Secondary hyperparathyroidism of renal origin: Secondary | ICD-10-CM | POA: Diagnosis not present

## 2020-07-27 DIAGNOSIS — D689 Coagulation defect, unspecified: Secondary | ICD-10-CM | POA: Diagnosis not present

## 2020-07-27 DIAGNOSIS — N186 End stage renal disease: Secondary | ICD-10-CM | POA: Diagnosis not present

## 2020-07-27 DIAGNOSIS — D631 Anemia in chronic kidney disease: Secondary | ICD-10-CM | POA: Diagnosis not present

## 2020-07-28 ENCOUNTER — Telehealth: Payer: Self-pay

## 2020-07-28 ENCOUNTER — Ambulatory Visit (INDEPENDENT_AMBULATORY_CARE_PROVIDER_SITE_OTHER): Payer: Medicare Other | Admitting: Family Medicine

## 2020-07-28 VITALS — BP 133/63 | HR 82 | Wt 222.0 lb

## 2020-07-28 DIAGNOSIS — I4891 Unspecified atrial fibrillation: Secondary | ICD-10-CM

## 2020-07-28 LAB — POCT INR: INR: 1.5 — AB (ref 2.0–3.0)

## 2020-07-28 NOTE — Progress Notes (Signed)
See coumadin flow sheet. Still taking 5 mg of amlodipine and BP at goal and not too low. Increase dose. Only take 2.5mg  twice a week on Tues and Fri and 5mg  the other 5 days per week. Recheck INR in 10 days.

## 2020-07-28 NOTE — Telephone Encounter (Signed)
I would encourage him to contact the company directly to see if his machine may need to be recalibrated.

## 2020-07-28 NOTE — Telephone Encounter (Signed)
Louis [name] states the [employer] is checking his blood sugar about 20 mg/dl less than the standard glucometer.   We did check the glucose in the office and the value was 186 mg/dl and the [facility] was 145 mg/dl. Please advise.

## 2020-07-29 DIAGNOSIS — D689 Coagulation defect, unspecified: Secondary | ICD-10-CM | POA: Diagnosis not present

## 2020-07-29 DIAGNOSIS — E876 Hypokalemia: Secondary | ICD-10-CM | POA: Diagnosis not present

## 2020-07-29 DIAGNOSIS — E113213 Type 2 diabetes mellitus with mild nonproliferative diabetic retinopathy with macular edema, bilateral: Secondary | ICD-10-CM | POA: Diagnosis not present

## 2020-07-29 DIAGNOSIS — Z992 Dependence on renal dialysis: Secondary | ICD-10-CM | POA: Diagnosis not present

## 2020-07-29 DIAGNOSIS — N2581 Secondary hyperparathyroidism of renal origin: Secondary | ICD-10-CM | POA: Diagnosis not present

## 2020-07-29 DIAGNOSIS — D631 Anemia in chronic kidney disease: Secondary | ICD-10-CM | POA: Diagnosis not present

## 2020-07-29 DIAGNOSIS — N186 End stage renal disease: Secondary | ICD-10-CM | POA: Diagnosis not present

## 2020-07-29 NOTE — Telephone Encounter (Signed)
Louis [name] states the company is going to send a new sensor.

## 2020-07-31 DIAGNOSIS — N186 End stage renal disease: Secondary | ICD-10-CM | POA: Diagnosis not present

## 2020-07-31 DIAGNOSIS — D631 Anemia in chronic kidney disease: Secondary | ICD-10-CM | POA: Diagnosis not present

## 2020-07-31 DIAGNOSIS — Z992 Dependence on renal dialysis: Secondary | ICD-10-CM | POA: Diagnosis not present

## 2020-07-31 DIAGNOSIS — N2581 Secondary hyperparathyroidism of renal origin: Secondary | ICD-10-CM | POA: Diagnosis not present

## 2020-07-31 DIAGNOSIS — D689 Coagulation defect, unspecified: Secondary | ICD-10-CM | POA: Diagnosis not present

## 2020-07-31 DIAGNOSIS — E876 Hypokalemia: Secondary | ICD-10-CM | POA: Diagnosis not present

## 2020-08-02 ENCOUNTER — Other Ambulatory Visit: Payer: Self-pay

## 2020-08-02 DIAGNOSIS — E1122 Type 2 diabetes mellitus with diabetic chronic kidney disease: Secondary | ICD-10-CM

## 2020-08-03 DIAGNOSIS — N2581 Secondary hyperparathyroidism of renal origin: Secondary | ICD-10-CM | POA: Diagnosis not present

## 2020-08-03 DIAGNOSIS — N186 End stage renal disease: Secondary | ICD-10-CM | POA: Diagnosis not present

## 2020-08-03 DIAGNOSIS — D689 Coagulation defect, unspecified: Secondary | ICD-10-CM | POA: Diagnosis not present

## 2020-08-03 DIAGNOSIS — E876 Hypokalemia: Secondary | ICD-10-CM | POA: Diagnosis not present

## 2020-08-03 DIAGNOSIS — D631 Anemia in chronic kidney disease: Secondary | ICD-10-CM | POA: Diagnosis not present

## 2020-08-03 DIAGNOSIS — Z992 Dependence on renal dialysis: Secondary | ICD-10-CM | POA: Diagnosis not present

## 2020-08-04 ENCOUNTER — Other Ambulatory Visit: Payer: Self-pay | Admitting: Family Medicine

## 2020-08-04 DIAGNOSIS — E1122 Type 2 diabetes mellitus with diabetic chronic kidney disease: Secondary | ICD-10-CM

## 2020-08-04 DIAGNOSIS — N184 Chronic kidney disease, stage 4 (severe): Secondary | ICD-10-CM

## 2020-08-05 DIAGNOSIS — Z992 Dependence on renal dialysis: Secondary | ICD-10-CM | POA: Diagnosis not present

## 2020-08-05 DIAGNOSIS — N186 End stage renal disease: Secondary | ICD-10-CM | POA: Diagnosis not present

## 2020-08-05 DIAGNOSIS — D689 Coagulation defect, unspecified: Secondary | ICD-10-CM | POA: Diagnosis not present

## 2020-08-05 DIAGNOSIS — D631 Anemia in chronic kidney disease: Secondary | ICD-10-CM | POA: Diagnosis not present

## 2020-08-05 DIAGNOSIS — E876 Hypokalemia: Secondary | ICD-10-CM | POA: Diagnosis not present

## 2020-08-05 DIAGNOSIS — N2581 Secondary hyperparathyroidism of renal origin: Secondary | ICD-10-CM | POA: Diagnosis not present

## 2020-08-06 ENCOUNTER — Other Ambulatory Visit: Payer: Self-pay

## 2020-08-06 DIAGNOSIS — E1122 Type 2 diabetes mellitus with diabetic chronic kidney disease: Secondary | ICD-10-CM

## 2020-08-06 NOTE — Telephone Encounter (Signed)
Per pharmacist Tillie Rung), [facility] insurance will not covered DM testing strips, 4 x times daily. Pharmacist stated that insurance will cover if written as "1 strip to check glucose 3 times daily". Pharmacy is requesting a new rx. Pended.

## 2020-08-07 DIAGNOSIS — N186 End stage renal disease: Secondary | ICD-10-CM | POA: Diagnosis not present

## 2020-08-07 DIAGNOSIS — Z992 Dependence on renal dialysis: Secondary | ICD-10-CM | POA: Diagnosis not present

## 2020-08-07 DIAGNOSIS — D689 Coagulation defect, unspecified: Secondary | ICD-10-CM | POA: Diagnosis not present

## 2020-08-07 DIAGNOSIS — D631 Anemia in chronic kidney disease: Secondary | ICD-10-CM | POA: Diagnosis not present

## 2020-08-07 DIAGNOSIS — E876 Hypokalemia: Secondary | ICD-10-CM | POA: Diagnosis not present

## 2020-08-07 DIAGNOSIS — N2581 Secondary hyperparathyroidism of renal origin: Secondary | ICD-10-CM | POA: Diagnosis not present

## 2020-08-09 MED ORDER — ONETOUCH ULTRA VI STRP: ORAL_STRIP | 11 refills | Status: DC

## 2020-08-10 DIAGNOSIS — E876 Hypokalemia: Secondary | ICD-10-CM | POA: Diagnosis not present

## 2020-08-10 DIAGNOSIS — Z992 Dependence on renal dialysis: Secondary | ICD-10-CM | POA: Diagnosis not present

## 2020-08-10 DIAGNOSIS — N2581 Secondary hyperparathyroidism of renal origin: Secondary | ICD-10-CM | POA: Diagnosis not present

## 2020-08-10 DIAGNOSIS — N186 End stage renal disease: Secondary | ICD-10-CM | POA: Diagnosis not present

## 2020-08-10 DIAGNOSIS — D689 Coagulation defect, unspecified: Secondary | ICD-10-CM | POA: Diagnosis not present

## 2020-08-10 DIAGNOSIS — D631 Anemia in chronic kidney disease: Secondary | ICD-10-CM | POA: Diagnosis not present

## 2020-08-11 ENCOUNTER — Ambulatory Visit (INDEPENDENT_AMBULATORY_CARE_PROVIDER_SITE_OTHER): Payer: Medicare Other | Admitting: Family Medicine

## 2020-08-11 ENCOUNTER — Ambulatory Visit: Payer: Medicare Other

## 2020-08-11 VITALS — BP 140/54 | HR 55

## 2020-08-11 DIAGNOSIS — I4891 Unspecified atrial fibrillation: Secondary | ICD-10-CM | POA: Diagnosis not present

## 2020-08-11 LAB — POCT INR: INR: 1.9 — AB (ref 2.0–3.0)

## 2020-08-11 NOTE — Progress Notes (Signed)
Please see adjustment to Coumadin.

## 2020-08-12 DIAGNOSIS — N2581 Secondary hyperparathyroidism of renal origin: Secondary | ICD-10-CM | POA: Diagnosis not present

## 2020-08-12 DIAGNOSIS — N186 End stage renal disease: Secondary | ICD-10-CM | POA: Diagnosis not present

## 2020-08-12 DIAGNOSIS — D689 Coagulation defect, unspecified: Secondary | ICD-10-CM | POA: Diagnosis not present

## 2020-08-12 DIAGNOSIS — E876 Hypokalemia: Secondary | ICD-10-CM | POA: Diagnosis not present

## 2020-08-12 DIAGNOSIS — Z992 Dependence on renal dialysis: Secondary | ICD-10-CM | POA: Diagnosis not present

## 2020-08-12 DIAGNOSIS — D631 Anemia in chronic kidney disease: Secondary | ICD-10-CM | POA: Diagnosis not present

## 2020-08-13 ENCOUNTER — Ambulatory Visit (HOSPITAL_COMMUNITY)
Admission: RE | Admit: 2020-08-13 | Discharge: 2020-08-13 | Disposition: A | Discharge status: Home / Self Care | Payer: Medicare Other | Source: Ambulatory Visit | Attending: Vascular Surgery | Admitting: Vascular Surgery

## 2020-08-13 ENCOUNTER — Other Ambulatory Visit: Payer: Self-pay

## 2020-08-13 ENCOUNTER — Encounter: Payer: Self-pay | Admitting: Vascular Surgery

## 2020-08-13 ENCOUNTER — Ambulatory Visit (INDEPENDENT_AMBULATORY_CARE_PROVIDER_SITE_OTHER)
Admission: RE | Admit: 2020-08-13 | Discharge: 2020-08-13 | Disposition: A | Discharge status: Home / Self Care | Payer: Medicare Other | Source: Ambulatory Visit | Attending: Vascular Surgery | Admitting: Vascular Surgery

## 2020-08-13 ENCOUNTER — Ambulatory Visit (INDEPENDENT_AMBULATORY_CARE_PROVIDER_SITE_OTHER): Payer: Medicare Other | Admitting: Vascular Surgery

## 2020-08-13 VITALS — BP 115/68 | HR 73 | Resp 20 | Ht 71.0 in | Wt 219.0 lb

## 2020-08-13 DIAGNOSIS — E1122 Type 2 diabetes mellitus with diabetic chronic kidney disease: Secondary | ICD-10-CM | POA: Insufficient documentation

## 2020-08-13 DIAGNOSIS — N185 Chronic kidney disease, stage 5: Secondary | ICD-10-CM

## 2020-08-13 DIAGNOSIS — Z992 Dependence on renal dialysis: Secondary | ICD-10-CM | POA: Diagnosis not present

## 2020-08-13 DIAGNOSIS — N186 End stage renal disease: Secondary | ICD-10-CM | POA: Diagnosis not present

## 2020-08-13 NOTE — H&P (View-Only) (Signed)
Patient ID: Louis [name], male   DOB: 11-Oct-1946, 74 y.o.   MRN: [id number]  Reason for Consult: New Patient (Initial Visit)   Referred by [name], *  Subjective:     HPI:  Louis [name] is a 74 y.o. male on dialysis Tuesdays, Thursdays, Saturdays via right IJ catheter.  This is a second catheter.  Previously was doing home dialysis until his wife was diagnosed with cancer.  He is now back at the center.  He takes Coumadin for atrial fibrillation.  He has never had any upper arm, chest, breast surgery.  Denies any history of pacemaker or defibrillator.  He walks with the help of a cane.  He is right-hand dominant  Past Medical History:  Diagnosis Date  . Brittle bones    per pt, has soft bones in right foot/wears boot cast!  . CAD (coronary artery disease)   . Cataract    Bil/ surg scheduled for right eye 01/18/17/ left eye 02/08/17  . Charcot ankle, right 2019  . CHF (congestive heart failure) ([place]) 2015  . Diabetes mellitus   . Heart failure, diastolic ([place])   . Hyperlipidemia   . Hypertension   . Macular edema 2014  . OSA on CPAP   . Paroxysmal atrial fibrillation ([facility])   . Personal history of colonic polyps - adenomas 01/28/2014  . Renal insufficiency    being examined for dialysis for home dialysis [facility]  . Shortness of breath dyspnea   . Syncope and collapse    Family History  Problem Relation Age of Onset  . Diabetes Mother   . Kidney disease Mother   . Diabetes Father   . Coronary artery disease Father   . Diabetes Brother   . Diabetes Sister   . Prostate cancer Maternal Uncle   . Diabetes Maternal Grandmother   . Cancer Paternal Grandmother        unknown  . Heart attack Paternal Grandfather   . Colon cancer Neg Hx    Past Surgical History:  Procedure Laterality Date  . CARPAL TUNNEL RELEASE     left hand  . COLONOSCOPY    . INTRAVASCULAR PRESSURE WIRE/FFR STUDY N/A 12/18/2018   Procedure: INTRAVASCULAR PRESSURE WIRE/FFR STUDY;   Surgeon: Wellington Hampshire, MD;  Location: [facility];  Service: Cardiovascular;  Laterality: N/A;  . IR FLUORO GUIDE CV LINE RIGHT  12/16/2018  . IR US GUIDE VASC ACCESS RIGHT  12/16/2018  . KNEE ARTHROSCOPY Right 09/13/2016   [facility], Dr. Dorna Leitz  . PILONIDAL CYST EXCISION    . RIGHT/LEFT HEART CATH AND CORONARY ANGIOGRAPHY N/A 12/18/2018   Procedure: RIGHT/LEFT HEART CATH AND CORONARY ANGIOGRAPHY;  Surgeon: Wellington Hampshire, MD;  Location: [facility];  Service: Cardiovascular;  Laterality: N/A;    Short Social History:  Social History   Tobacco Use  . Smoking status: Former Smoker    Packs/day: 0.50    Years: 20.00    Pack years: 10.00    Types: Cigarettes    Quit date: 03/06/1977    Years since quitting: 43.4  . Smokeless tobacco: Never Used  Substance Use Topics  . Alcohol use: No    Allergies  Allergen Reactions  . Hydrocodone Nausea And Vomiting    Other reaction(s): GI Upset (intolerance) Projectile vomiting Projectile vomiting  . Oxycodone Nausea And Vomiting    Other reaction(s): GI Upset (intolerance), Vomiting (intolerance) Projectile vomiting Projectile vomiting  . Dacarbazine Other (See Comments)  Other reaction(s): Unknown  . Tape Dermatitis, Itching and Rash    Patch used at dialysis    Current Outpatient Medications  Medication Sig Dispense Refill  . allopurinol (ZYLOPRIM) 100 MG tablet TAKE 1 TO 1 & 1/2 (ONE & ONE-HALF) TABLETS BY MOUTH TWICE DAILY 270 tablet 1  . AMBULATORY NON FORMULARY MEDICATION Continuous positive airway pressure (CPAP) device: Auto titrate minimum 4 cm H20 to maximum of 20 cm H2O with pressure. Please provide all supplemental supplies as needed. Fax to: [phone] 1 Units 1  . AMBULATORY NON FORMULARY MEDICATION Shower chair - Dx: M25.561, M54.12, M10.00 - Fax to - [phone] 1 each 0  . AMBULATORY NON FORMULARY MEDICATION Rollator walker with seat. Dx: M25.561, M54.12, M10.00 - Fax to -  [phone] 1 each 0  . amitriptyline (ELAVIL) 25 MG tablet Take 12.5 mg by mouth every evening.    Marland Kitchen amLODipine (NORVASC) 2.5 MG tablet Take 1 tablet (2.5 mg total) by mouth daily. Pt is taking this in the morning (Patient taking differently: Take 5 mg by mouth daily. Pt is taking this in the morning) 90 tablet 0  . B-D INS SYR ULTRAFINE 1CC/31G 31G X 5/16" 1 ML MISC     . blood glucose meter kit and supplies Use to check blood sugars daily E11.65    . Cholecalciferol (VITAMIN D3) 5000 units CAPS Take 5,000 Units by mouth daily.    . Coenzyme Q10 (CO Q 10 PO) Take 1 tablet by mouth daily.    . Continuous Blood Gluc Receiver (FREESTYLE LIBRE 14 DAY READER) DEVI Dx DM E11.22 Check blood sugar 4 times daily. 1 each prn  . Continuous Blood Gluc Sensor (FREESTYLE LIBRE 14 DAY SENSOR) MISC Dx DM E11.22 Check blood sugar 4 times daily. 1 each prn  . furosemide (LASIX) 80 MG tablet Take 160 mg by mouth 2 (two) times daily.     Marland Kitchen gabapentin (NEURONTIN) 300 MG capsule Take 1 capsule by mouth once daily 90 capsule 1  . hydrALAZINE (APRESOLINE) 100 MG tablet Take 1 tablet (100 mg total) by mouth 3 (three) times daily. 270 tablet 3  . isosorbide dinitrate (ISORDIL) 20 MG tablet Take 1 tablet by mouth twice daily 60 tablet 1  . metolazone (ZAROXOLYN) 5 MG tablet Take 5 mg by mouth as needed.    . nitroGLYCERIN (NITROSTAT) 0.4 MG SL tablet Place 1 tablet (0.4 mg total) under the tongue every 5 (five) minutes as needed for chest pain. 25 tablet 0  . NOVOLIN N 100 UNIT/ML injection Inject 32 Units into the skin 2 (two) times daily before a meal.     . NOVOLIN R 100 UNIT/ML injection Inject 5-20 Units into the skin 3 (three) times daily with meals.   5  . ONETOUCH ULTRA test strip USE 1 STRIP TO CHECK GLUCOSE 3 TIMES DAILY. E11.22 300 each 11  . rosuvastatin (CRESTOR) 40 MG tablet Take 1 tablet (40 mg total) by mouth daily. 90 tablet 3  . VITAMIN E PO Take 1 capsule by mouth daily.    Marland Kitchen warfarin (COUMADIN) 5 MG  tablet TAKE 1 TABLET BY MOUTH ONCE DAILY AT 6 PM MONITOR INR GOAL SHOULD BE BETWEEN 2-3. (Patient taking differently: Take 5 mg by mouth daily. Take 17m Monday, Wednesday, and Saturday.  Take 2.558mon Tuesday, Thursday, Friday, and sunday) 90 tablet 0  . zinc sulfate 220 (50 Zn) MG capsule Take 220 mg by mouth daily.     No current facility-administered medications for this  visit.    Review of Systems  Constitutional:  Constitutional negative. HENT: HENT negative.  Eyes: Eyes negative.  Respiratory: Respiratory negative.  Cardiovascular: Cardiovascular negative.  GI: Gastrointestinal negative.  Musculoskeletal: Musculoskeletal negative.  Skin: Skin negative.  Hematologic: Hematologic/lymphatic negative.  Psychiatric: Psychiatric negative.        Objective:  Objective   Vitals:   08/13/20 1019  BP: 115/68  Pulse: 73  Resp: 20  SpO2: 100%  Weight: 219 lb (99.3 kg)  Height: 5' 11"  (1.803 m)   Body mass index is 30.54 kg/m.  Physical Exam Constitutional:      Appearance: He is obese.  HENT:     Head: Normocephalic.     Nose:     Comments: Wearing a mask Eyes:     Pupils: Pupils are equal, round, and reactive to light.  Cardiovascular:     Pulses:          Radial pulses are 2+ on the right side and 2+ on the left side.  Pulmonary:     Effort: Pulmonary effort is normal.  Musculoskeletal:        General: No swelling. Normal range of motion.  Skin:    General: Skin is warm.     Capillary Refill: Capillary refill takes less than 2 seconds.  Neurological:     General: No focal deficit present.     Mental Status: He is alert.  Psychiatric:        Mood and Affect: Mood normal.        Thought Content: Thought content normal.        Judgment: Judgment normal.     Data: +-----------------+-------------+----------+---------+  Right Cephalic  Diameter (cm)Depth (cm)Findings   +-----------------+-------------+----------+---------+  Shoulder       0.40                +-----------------+-------------+----------+---------+  Prox upper arm    0.38               +-----------------+-------------+----------+---------+  Mid upper arm    0.38               +-----------------+-------------+----------+---------+  Dist upper arm   0.36 / 0.27      branching  +-----------------+-------------+----------+---------+  Antecubital fossa  0.38               +-----------------+-------------+----------+---------+  Prox forearm     0.32         joins   +-----------------+-------------+----------+---------+  Mid forearm     0.29               +-----------------+-------------+----------+---------+  Dist forearm     0.24               +-----------------+-------------+----------+---------+  Wrist        0.24               +-----------------+-------------+----------+---------+   +-----------------+-------------+----------+--------------+  Right Basilic  Diameter (cm)Depth (cm)  Findings    +-----------------+-------------+----------+--------------+  Prox upper arm              not visualized  +-----------------+-------------+----------+--------------+  Mid upper arm    0.78                 +-----------------+-------------+----------+--------------+  Dist upper arm   0.20 / 0.59        joins     +-----------------+-------------+----------+--------------+  Antecubital fossa  0.30                 +-----------------+-------------+----------+--------------+  Prox forearm     0.26                 +-----------------+-------------+----------+--------------+   +-----------------+-------------+----------+---------------------+  Left Cephalic  Diameter (cm)Depth (cm)   Findings        +-----------------+-------------+----------+---------------------+  Shoulder       0.36                     +-----------------+-------------+----------+---------------------+  Prox upper arm    0.31                     +-----------------+-------------+----------+---------------------+  Mid upper arm    0.34                     +-----------------+-------------+----------+---------------------+  Dist upper arm    0.32        crosses and branching  +-----------------+-------------+----------+---------------------+  Antecubital fossa              not visualized    +-----------------+-------------+----------+---------------------+  Prox forearm     0.39                     +-----------------+-------------+----------+---------------------+  Mid forearm     0.41                     +-----------------+-------------+----------+---------------------+  Dist forearm    0.37 / 0.34                   +-----------------+-------------+----------+---------------------+   +-----------------+-------------+----------+--------------+  Left Basilic   Diameter (cm)Depth (cm)  Findings    +-----------------+-------------+----------+--------------+  Prox upper arm              not visualized  +-----------------+-------------+----------+--------------+  Mid upper arm    0.55                 +-----------------+-------------+----------+--------------+  Dist upper arm    0.54                 +-----------------+-------------+----------+--------------+  Antecubital fossa 0.62 / 0.47               +-----------------+-------------+----------+--------------+  Prox forearm     0.47                  +-----------------+-------------+----------+--------------+       Right Pre-Dialysis Findings:  +-----------------------+----------+--------------------+---------+--------  +  Location        PSV (cm/s)Intralum. Diam. (cm)Waveform  Comments  +-----------------------+----------+--------------------+---------+--------  +  Brachial Antecub. [id number]    0.24        triphasic        +-----------------------+----------+--------------------+---------+--------  +  Radial Art at Wrist  83    0.23        triphasic        +-----------------------+----------+--------------------+---------+--------  +  Ulnar Art at Wrist   98    0.24        triphasic        +-----------------------+----------+--------------------+---------+--------  +      Left Pre-Dialysis Findings:  +-----------------------+----------+--------------------+---------+--------  +  Location        PSV (cm/s)Intralum. Diam. (cm)Waveform  Comments  +-----------------------+----------+--------------------+---------+--------  +  Brachial Antecub. [id number]    0.56        triphasic        +-----------------------+----------+--------------------+---------+--------  +  Radial Art at Wrist  81    0.21        triphasic        +-----------------------+----------+--------------------+---------+--------  +  Ulnar Art at Wrist   80    0.27        triphasic        +-----------------------+----------+--------------------+---------+--------  +  Assessment/Plan:     74yo WM with esrd on hd via catheter. Plan is for left arm avf vs avg on non dialysis day in the near future.  He is on Coumadin for atrial fibrillation this will need to be held for 72 hours prior to procedure.  We discussed the risk benefits alternatives he demonstrates good understanding.     Waynetta Sandy MD [facility]

## 2020-08-13 NOTE — Progress Notes (Signed)
Left message advising of recommendations.  

## 2020-08-13 NOTE — Progress Notes (Signed)
Patient ID: Louis [name], male   DOB: Apr 22, 1946, 74 y.o.   MRN: [id number]  Reason for Consult: New Patient (Initial Visit)   Referred by [name], *  Subjective:     HPI:  Louis [name] is a 74 y.o. male on dialysis Tuesdays, Thursdays, Saturdays via right IJ catheter.  This is a second catheter.  Previously was doing home dialysis until his wife was diagnosed with cancer.  He is now back at the center.  He takes Coumadin for atrial fibrillation.  He has never had any upper arm, chest, breast surgery.  Denies any history of pacemaker or defibrillator.  He walks with the help of a cane.  He is right-hand dominant  Past Medical History:  Diagnosis Date  . Brittle bones    per pt, has soft bones in right foot/wears boot cast!  . CAD (coronary artery disease)   . Cataract    Bil/ surg scheduled for right eye 01/18/17/ left eye 02/08/17  . Charcot ankle, right 2019  . CHF (congestive heart failure) ([facility]) 2015  . Diabetes mellitus   . Heart failure, diastolic ([facility])   . Hyperlipidemia   . Hypertension   . Macular edema 2014  . OSA on CPAP   . Paroxysmal atrial fibrillation ([facility])   . Personal history of colonic polyps - adenomas 01/28/2014  . Renal insufficiency    being examined for dialysis for home dialysis [facility]  . Shortness of breath dyspnea   . Syncope and collapse    Family History  Problem Relation Age of Onset  . Diabetes Mother   . Kidney disease Mother   . Diabetes Father   . Coronary artery disease Father   . Diabetes Brother   . Diabetes Sister   . Prostate cancer Maternal Uncle   . Diabetes Maternal Grandmother   . Cancer Paternal Grandmother        unknown  . Heart attack Paternal Grandfather   . Colon cancer Neg Hx    Past Surgical History:  Procedure Laterality Date  . CARPAL TUNNEL RELEASE     left hand  . COLONOSCOPY    . INTRAVASCULAR PRESSURE WIRE/FFR STUDY N/A 12/18/2018   Procedure: INTRAVASCULAR PRESSURE WIRE/FFR STUDY;   Surgeon: Wellington Hampshire, MD;  Location: [facility];  Service: Cardiovascular;  Laterality: N/A;  . IR FLUORO GUIDE CV LINE RIGHT  12/16/2018  . IR US GUIDE VASC ACCESS RIGHT  12/16/2018  . KNEE ARTHROSCOPY Right 09/13/2016   [facility], Dr. Dorna Leitz  . PILONIDAL CYST EXCISION    . RIGHT/LEFT HEART CATH AND CORONARY ANGIOGRAPHY N/A 12/18/2018   Procedure: RIGHT/LEFT HEART CATH AND CORONARY ANGIOGRAPHY;  Surgeon: Wellington Hampshire, MD;  Location: [facility];  Service: Cardiovascular;  Laterality: N/A;    Short Social History:  Social History   Tobacco Use  . Smoking status: Former Smoker    Packs/day: 0.50    Years: 20.00    Pack years: 10.00    Types: Cigarettes    Quit date: 03/06/1977    Years since quitting: 43.4  . Smokeless tobacco: Never Used  Substance Use Topics  . Alcohol use: No    Allergies  Allergen Reactions  . Hydrocodone Nausea And Vomiting    Other reaction(s): GI Upset (intolerance) Projectile vomiting Projectile vomiting  . Oxycodone Nausea And Vomiting    Other reaction(s): GI Upset (intolerance), Vomiting (intolerance) Projectile vomiting Projectile vomiting  . Dacarbazine Other (See Comments)  Other reaction(s): Unknown  . Tape Dermatitis, Itching and Rash    Patch used at dialysis    Current Outpatient Medications  Medication Sig Dispense Refill  . allopurinol (ZYLOPRIM) 100 MG tablet TAKE 1 TO 1 & 1/2 (ONE & ONE-HALF) TABLETS BY MOUTH TWICE DAILY 270 tablet 1  . AMBULATORY NON FORMULARY MEDICATION Continuous positive airway pressure (CPAP) device: Auto titrate minimum 4 cm H20 to maximum of 20 cm H2O with pressure. Please provide all supplemental supplies as needed. Fax to: [phone] 1 Units 1  . AMBULATORY NON FORMULARY MEDICATION Shower chair - Dx: M25.561, M54.12, M10.00 - Fax to - [phone] 1 each 0  . AMBULATORY NON FORMULARY MEDICATION Rollator walker with seat. Dx: M25.561, M54.12, M10.00 - Fax to -  [phone] 1 each 0  . amitriptyline (ELAVIL) 25 MG tablet Take 12.5 mg by mouth every evening.    Marland Kitchen amLODipine (NORVASC) 2.5 MG tablet Take 1 tablet (2.5 mg total) by mouth daily. Pt is taking this in the morning (Patient taking differently: Take 5 mg by mouth daily. Pt is taking this in the morning) 90 tablet 0  . B-D INS SYR ULTRAFINE 1CC/31G 31G X 5/16" 1 ML MISC     . blood glucose meter kit and supplies Use to check blood sugars daily E11.65    . Cholecalciferol (VITAMIN D3) 5000 units CAPS Take 5,000 Units by mouth daily.    . Coenzyme Q10 (CO Q 10 PO) Take 1 tablet by mouth daily.    . Continuous Blood Gluc Receiver (FREESTYLE LIBRE 14 DAY READER) DEVI Dx DM E11.22 Check blood sugar 4 times daily. 1 each prn  . Continuous Blood Gluc Sensor (FREESTYLE LIBRE 14 DAY SENSOR) MISC Dx DM E11.22 Check blood sugar 4 times daily. 1 each prn  . furosemide (LASIX) 80 MG tablet Take 160 mg by mouth 2 (two) times daily.     Marland Kitchen gabapentin (NEURONTIN) 300 MG capsule Take 1 capsule by mouth once daily 90 capsule 1  . hydrALAZINE (APRESOLINE) 100 MG tablet Take 1 tablet (100 mg total) by mouth 3 (three) times daily. 270 tablet 3  . isosorbide dinitrate (ISORDIL) 20 MG tablet Take 1 tablet by mouth twice daily 60 tablet 1  . metolazone (ZAROXOLYN) 5 MG tablet Take 5 mg by mouth as needed.    . nitroGLYCERIN (NITROSTAT) 0.4 MG SL tablet Place 1 tablet (0.4 mg total) under the tongue every 5 (five) minutes as needed for chest pain. 25 tablet 0  . NOVOLIN N 100 UNIT/ML injection Inject 32 Units into the skin 2 (two) times daily before a meal.     . NOVOLIN R 100 UNIT/ML injection Inject 5-20 Units into the skin 3 (three) times daily with meals.   5  . ONETOUCH ULTRA test strip USE 1 STRIP TO CHECK GLUCOSE 3 TIMES DAILY. E11.22 300 each 11  . rosuvastatin (CRESTOR) 40 MG tablet Take 1 tablet (40 mg total) by mouth daily. 90 tablet 3  . VITAMIN E PO Take 1 capsule by mouth daily.    Marland Kitchen warfarin (COUMADIN) 5 MG  tablet TAKE 1 TABLET BY MOUTH ONCE DAILY AT 6 PM MONITOR INR GOAL SHOULD BE BETWEEN 2-3. (Patient taking differently: Take 5 mg by mouth daily. Take 53m Monday, Wednesday, and Saturday.  Take 2.567mon Tuesday, Thursday, Friday, and sunday) 90 tablet 0  . zinc sulfate 220 (50 Zn) MG capsule Take 220 mg by mouth daily.     No current facility-administered medications for this  visit.    Review of Systems  Constitutional:  Constitutional negative. HENT: HENT negative.  Eyes: Eyes negative.  Respiratory: Respiratory negative.  Cardiovascular: Cardiovascular negative.  GI: Gastrointestinal negative.  Musculoskeletal: Musculoskeletal negative.  Skin: Skin negative.  Hematologic: Hematologic/lymphatic negative.  Psychiatric: Psychiatric negative.        Objective:  Objective   Vitals:   08/13/20 1019  BP: 115/68  Pulse: 73  Resp: 20  SpO2: 100%  Weight: 219 lb (99.3 kg)  Height: 5' 11"  (1.803 m)   Body mass index is 30.54 kg/m.  Physical Exam Constitutional:      Appearance: He is obese.  HENT:     Head: Normocephalic.     Nose:     Comments: Wearing a mask Eyes:     Pupils: Pupils are equal, round, and reactive to light.  Cardiovascular:     Pulses:          Radial pulses are 2+ on the right side and 2+ on the left side.  Pulmonary:     Effort: Pulmonary effort is normal.  Musculoskeletal:        General: No swelling. Normal range of motion.  Skin:    General: Skin is warm.     Capillary Refill: Capillary refill takes less than 2 seconds.  Neurological:     General: No focal deficit present.     Mental Status: He is alert.  Psychiatric:        Mood and Affect: Mood normal.        Thought Content: Thought content normal.        Judgment: Judgment normal.     Data: +-----------------+-------------+----------+---------+  Right Cephalic  Diameter (cm)Depth (cm)Findings   +-----------------+-------------+----------+---------+  Shoulder       0.40                +-----------------+-------------+----------+---------+  Prox upper arm    0.38               +-----------------+-------------+----------+---------+  Mid upper arm    0.38               +-----------------+-------------+----------+---------+  Dist upper arm   0.36 / 0.27      branching  +-----------------+-------------+----------+---------+  Antecubital fossa  0.38               +-----------------+-------------+----------+---------+  Prox forearm     0.32         joins   +-----------------+-------------+----------+---------+  Mid forearm     0.29               +-----------------+-------------+----------+---------+  Dist forearm     0.24               +-----------------+-------------+----------+---------+  Wrist        0.24               +-----------------+-------------+----------+---------+   +-----------------+-------------+----------+--------------+  Right Basilic  Diameter (cm)Depth (cm)  Findings    +-----------------+-------------+----------+--------------+  Prox upper arm              not visualized  +-----------------+-------------+----------+--------------+  Mid upper arm    0.78                 +-----------------+-------------+----------+--------------+  Dist upper arm   0.20 / 0.59        joins     +-----------------+-------------+----------+--------------+  Antecubital fossa  0.30                 +-----------------+-------------+----------+--------------+  Prox forearm     0.26                 +-----------------+-------------+----------+--------------+   +-----------------+-------------+----------+---------------------+  Left Cephalic  Diameter (cm)Depth (cm)   Findings        +-----------------+-------------+----------+---------------------+  Shoulder       0.36                     +-----------------+-------------+----------+---------------------+  Prox upper arm    0.31                     +-----------------+-------------+----------+---------------------+  Mid upper arm    0.34                     +-----------------+-------------+----------+---------------------+  Dist upper arm    0.32        crosses and branching  +-----------------+-------------+----------+---------------------+  Antecubital fossa              not visualized    +-----------------+-------------+----------+---------------------+  Prox forearm     0.39                     +-----------------+-------------+----------+---------------------+  Mid forearm     0.41                     +-----------------+-------------+----------+---------------------+  Dist forearm    0.37 / 0.34                   +-----------------+-------------+----------+---------------------+   +-----------------+-------------+----------+--------------+  Left Basilic   Diameter (cm)Depth (cm)  Findings    +-----------------+-------------+----------+--------------+  Prox upper arm              not visualized  +-----------------+-------------+----------+--------------+  Mid upper arm    0.55                 +-----------------+-------------+----------+--------------+  Dist upper arm    0.54                 +-----------------+-------------+----------+--------------+  Antecubital fossa 0.62 / 0.47               +-----------------+-------------+----------+--------------+  Prox forearm     0.47                  +-----------------+-------------+----------+--------------+       Right Pre-Dialysis Findings:  +-----------------------+----------+--------------------+---------+--------  +  Location        PSV (cm/s)Intralum. Diam. (cm)Waveform  Comments  +-----------------------+----------+--------------------+---------+--------  +  Brachial Antecub. [id number]    0.24        triphasic        +-----------------------+----------+--------------------+---------+--------  +  Radial Art at Wrist  83    0.23        triphasic        +-----------------------+----------+--------------------+---------+--------  +  Ulnar Art at Wrist   98    0.24        triphasic        +-----------------------+----------+--------------------+---------+--------  +      Left Pre-Dialysis Findings:  +-----------------------+----------+--------------------+---------+--------  +  Location        PSV (cm/s)Intralum. Diam. (cm)Waveform  Comments  +-----------------------+----------+--------------------+---------+--------  +  Brachial Antecub. fossa130    0.56        triphasic        +-----------------------+----------+--------------------+---------+--------  +  Radial Art at Wrist  81    0.21        triphasic        +-----------------------+----------+--------------------+---------+--------  +  Ulnar Art at Wrist   80    0.27        triphasic        +-----------------------+----------+--------------------+---------+--------  +  Assessment/Plan:     74yo WM with esrd on hd via catheter. Plan is for left arm avf vs avg on non dialysis day in the near future.  He is on Coumadin for atrial fibrillation this will need to be held for 72 hours prior to procedure.  We discussed the risk benefits alternatives he demonstrates good understanding.     Waynetta Sandy MD [facility]

## 2020-08-14 DIAGNOSIS — N186 End stage renal disease: Secondary | ICD-10-CM | POA: Diagnosis not present

## 2020-08-14 DIAGNOSIS — D631 Anemia in chronic kidney disease: Secondary | ICD-10-CM | POA: Diagnosis not present

## 2020-08-14 DIAGNOSIS — E876 Hypokalemia: Secondary | ICD-10-CM | POA: Diagnosis not present

## 2020-08-14 DIAGNOSIS — D689 Coagulation defect, unspecified: Secondary | ICD-10-CM | POA: Diagnosis not present

## 2020-08-14 DIAGNOSIS — N2581 Secondary hyperparathyroidism of renal origin: Secondary | ICD-10-CM | POA: Diagnosis not present

## 2020-08-14 DIAGNOSIS — Z992 Dependence on renal dialysis: Secondary | ICD-10-CM | POA: Diagnosis not present

## 2020-08-17 DIAGNOSIS — E876 Hypokalemia: Secondary | ICD-10-CM | POA: Diagnosis not present

## 2020-08-17 DIAGNOSIS — N2581 Secondary hyperparathyroidism of renal origin: Secondary | ICD-10-CM | POA: Diagnosis not present

## 2020-08-17 DIAGNOSIS — D631 Anemia in chronic kidney disease: Secondary | ICD-10-CM | POA: Diagnosis not present

## 2020-08-17 DIAGNOSIS — Z992 Dependence on renal dialysis: Secondary | ICD-10-CM | POA: Diagnosis not present

## 2020-08-17 DIAGNOSIS — D689 Coagulation defect, unspecified: Secondary | ICD-10-CM | POA: Diagnosis not present

## 2020-08-17 DIAGNOSIS — N186 End stage renal disease: Secondary | ICD-10-CM | POA: Diagnosis not present

## 2020-08-19 ENCOUNTER — Other Ambulatory Visit: Payer: Self-pay | Admitting: Family Medicine

## 2020-08-19 DIAGNOSIS — Z992 Dependence on renal dialysis: Secondary | ICD-10-CM | POA: Diagnosis not present

## 2020-08-19 DIAGNOSIS — D689 Coagulation defect, unspecified: Secondary | ICD-10-CM | POA: Diagnosis not present

## 2020-08-19 DIAGNOSIS — D631 Anemia in chronic kidney disease: Secondary | ICD-10-CM | POA: Diagnosis not present

## 2020-08-19 DIAGNOSIS — N186 End stage renal disease: Secondary | ICD-10-CM | POA: Diagnosis not present

## 2020-08-19 DIAGNOSIS — E876 Hypokalemia: Secondary | ICD-10-CM | POA: Diagnosis not present

## 2020-08-19 DIAGNOSIS — I4891 Unspecified atrial fibrillation: Secondary | ICD-10-CM

## 2020-08-19 DIAGNOSIS — N2581 Secondary hyperparathyroidism of renal origin: Secondary | ICD-10-CM | POA: Diagnosis not present

## 2020-08-20 DIAGNOSIS — Z992 Dependence on renal dialysis: Secondary | ICD-10-CM | POA: Diagnosis not present

## 2020-08-20 DIAGNOSIS — N186 End stage renal disease: Secondary | ICD-10-CM | POA: Diagnosis not present

## 2020-08-20 DIAGNOSIS — E1122 Type 2 diabetes mellitus with diabetic chronic kidney disease: Secondary | ICD-10-CM | POA: Diagnosis not present

## 2020-08-21 DIAGNOSIS — D631 Anemia in chronic kidney disease: Secondary | ICD-10-CM | POA: Diagnosis not present

## 2020-08-21 DIAGNOSIS — D509 Iron deficiency anemia, unspecified: Secondary | ICD-10-CM | POA: Diagnosis not present

## 2020-08-21 DIAGNOSIS — E876 Hypokalemia: Secondary | ICD-10-CM | POA: Diagnosis not present

## 2020-08-21 DIAGNOSIS — E1122 Type 2 diabetes mellitus with diabetic chronic kidney disease: Secondary | ICD-10-CM | POA: Diagnosis not present

## 2020-08-21 DIAGNOSIS — N186 End stage renal disease: Secondary | ICD-10-CM | POA: Diagnosis not present

## 2020-08-21 DIAGNOSIS — Z992 Dependence on renal dialysis: Secondary | ICD-10-CM | POA: Diagnosis not present

## 2020-08-21 DIAGNOSIS — N2581 Secondary hyperparathyroidism of renal origin: Secondary | ICD-10-CM | POA: Diagnosis not present

## 2020-08-21 DIAGNOSIS — Z23 Encounter for immunization: Secondary | ICD-10-CM | POA: Diagnosis not present

## 2020-08-24 DIAGNOSIS — E876 Hypokalemia: Secondary | ICD-10-CM | POA: Diagnosis not present

## 2020-08-24 DIAGNOSIS — D509 Iron deficiency anemia, unspecified: Secondary | ICD-10-CM | POA: Diagnosis not present

## 2020-08-24 DIAGNOSIS — D631 Anemia in chronic kidney disease: Secondary | ICD-10-CM | POA: Diagnosis not present

## 2020-08-24 DIAGNOSIS — N186 End stage renal disease: Secondary | ICD-10-CM | POA: Diagnosis not present

## 2020-08-24 DIAGNOSIS — N2581 Secondary hyperparathyroidism of renal origin: Secondary | ICD-10-CM | POA: Diagnosis not present

## 2020-08-24 DIAGNOSIS — Z992 Dependence on renal dialysis: Secondary | ICD-10-CM | POA: Diagnosis not present

## 2020-08-25 ENCOUNTER — Ambulatory Visit (INDEPENDENT_AMBULATORY_CARE_PROVIDER_SITE_OTHER): Payer: Medicare Other | Admitting: Family Medicine

## 2020-08-25 ENCOUNTER — Other Ambulatory Visit: Payer: Self-pay

## 2020-08-25 VITALS — BP 123/65 | HR 77 | Wt 226.0 lb

## 2020-08-25 DIAGNOSIS — I4811 Longstanding persistent atrial fibrillation: Secondary | ICD-10-CM | POA: Diagnosis not present

## 2020-08-25 LAB — POCT INR: INR: 2 (ref 2.0–3.0)

## 2020-08-25 NOTE — Progress Notes (Signed)
Continue regimen of 5mg  on 6 days per week  2.5 mg 1 days per week (on Tues).   Normally I would probably make a minor adjustment but since he is coming off next week for surgery then just continue current regimen.  Recommend recheck 1 week after restart of Coumadin.

## 2020-08-26 ENCOUNTER — Ambulatory Visit: Payer: Medicare Other

## 2020-08-26 DIAGNOSIS — H35373 Puckering of macula, bilateral: Secondary | ICD-10-CM | POA: Diagnosis not present

## 2020-08-26 DIAGNOSIS — N2581 Secondary hyperparathyroidism of renal origin: Secondary | ICD-10-CM | POA: Diagnosis not present

## 2020-08-26 DIAGNOSIS — D631 Anemia in chronic kidney disease: Secondary | ICD-10-CM | POA: Diagnosis not present

## 2020-08-26 DIAGNOSIS — E113213 Type 2 diabetes mellitus with mild nonproliferative diabetic retinopathy with macular edema, bilateral: Secondary | ICD-10-CM | POA: Diagnosis not present

## 2020-08-26 DIAGNOSIS — Z992 Dependence on renal dialysis: Secondary | ICD-10-CM | POA: Diagnosis not present

## 2020-08-26 DIAGNOSIS — E876 Hypokalemia: Secondary | ICD-10-CM | POA: Diagnosis not present

## 2020-08-26 DIAGNOSIS — D509 Iron deficiency anemia, unspecified: Secondary | ICD-10-CM | POA: Diagnosis not present

## 2020-08-26 DIAGNOSIS — E113212 Type 2 diabetes mellitus with mild nonproliferative diabetic retinopathy with macular edema, left eye: Secondary | ICD-10-CM | POA: Diagnosis not present

## 2020-08-26 DIAGNOSIS — N186 End stage renal disease: Secondary | ICD-10-CM | POA: Diagnosis not present

## 2020-08-28 DIAGNOSIS — Z992 Dependence on renal dialysis: Secondary | ICD-10-CM | POA: Diagnosis not present

## 2020-08-28 DIAGNOSIS — D631 Anemia in chronic kidney disease: Secondary | ICD-10-CM | POA: Diagnosis not present

## 2020-08-28 DIAGNOSIS — E876 Hypokalemia: Secondary | ICD-10-CM | POA: Diagnosis not present

## 2020-08-28 DIAGNOSIS — N2581 Secondary hyperparathyroidism of renal origin: Secondary | ICD-10-CM | POA: Diagnosis not present

## 2020-08-28 DIAGNOSIS — D509 Iron deficiency anemia, unspecified: Secondary | ICD-10-CM | POA: Diagnosis not present

## 2020-08-28 DIAGNOSIS — N186 End stage renal disease: Secondary | ICD-10-CM | POA: Diagnosis not present

## 2020-08-29 ENCOUNTER — Other Ambulatory Visit: Payer: Self-pay | Admitting: Family Medicine

## 2020-08-30 ENCOUNTER — Other Ambulatory Visit (HOSPITAL_COMMUNITY)
Admission: RE | Admit: 2020-08-30 | Discharge: 2020-08-30 | Disposition: A | Discharge status: Home / Self Care | Payer: Medicare Other | Source: Ambulatory Visit | Attending: Vascular Surgery | Admitting: Vascular Surgery

## 2020-08-30 DIAGNOSIS — Z20822 Contact with and (suspected) exposure to covid-19: Secondary | ICD-10-CM | POA: Insufficient documentation

## 2020-08-30 DIAGNOSIS — Z01812 Encounter for preprocedural laboratory examination: Secondary | ICD-10-CM | POA: Diagnosis not present

## 2020-08-30 LAB — SARS CORONAVIRUS 2 (TAT 6-24 HRS): SARS Coronavirus 2: NEGATIVE

## 2020-08-31 ENCOUNTER — Other Ambulatory Visit: Payer: Self-pay

## 2020-08-31 ENCOUNTER — Encounter (HOSPITAL_COMMUNITY): Payer: Self-pay | Admitting: Vascular Surgery

## 2020-08-31 ENCOUNTER — Telehealth: Payer: Self-pay

## 2020-08-31 DIAGNOSIS — Z992 Dependence on renal dialysis: Secondary | ICD-10-CM | POA: Diagnosis not present

## 2020-08-31 DIAGNOSIS — N186 End stage renal disease: Secondary | ICD-10-CM | POA: Diagnosis not present

## 2020-08-31 DIAGNOSIS — E876 Hypokalemia: Secondary | ICD-10-CM | POA: Diagnosis not present

## 2020-08-31 DIAGNOSIS — D631 Anemia in chronic kidney disease: Secondary | ICD-10-CM | POA: Diagnosis not present

## 2020-08-31 DIAGNOSIS — N2581 Secondary hyperparathyroidism of renal origin: Secondary | ICD-10-CM | POA: Diagnosis not present

## 2020-08-31 DIAGNOSIS — D509 Iron deficiency anemia, unspecified: Secondary | ICD-10-CM | POA: Diagnosis not present

## 2020-08-31 NOTE — Anesthesia Preprocedure Evaluation (Addendum)
Anesthesia Evaluation  Patient identified by MRN, date of birth, ID band Patient awake    Reviewed: Allergy & Precautions, NPO status , Patient's Chart, lab work & pertinent test results  Airway Mallampati: III  TM Distance: >3 FB Neck ROM: Full    Dental  (+) Dental Advisory Given   Pulmonary shortness of breath, sleep apnea , former smoker,    breath sounds clear to auscultation       Cardiovascular hypertension, Pt. on medications + CAD and +CHF  + dysrhythmias Atrial Fibrillation  Rhythm:Regular Rate:Normal     Neuro/Psych  Neuromuscular disease    GI/Hepatic negative GI ROS, Neg liver ROS,   Endo/Other  diabetes, Type 2, Insulin Dependent  Renal/GU CRFRenal disease     Musculoskeletal  (+) Arthritis ,   Abdominal   Peds  Hematology negative hematology ROS (+)   Anesthesia Other Findings   Reproductive/Obstetrics                            Anesthesia Physical Anesthesia Plan  ASA: III  Anesthesia Plan: MAC   Post-op Pain Management:    Induction:   PONV Risk Score and Plan: 1 and Propofol infusion, Ondansetron and Treatment may vary due to age or medical condition  Airway Management Planned: Natural Airway and Simple Face Mask  Additional Equipment:   Intra-op Plan:   Post-operative Plan:   Informed Consent: I have reviewed the patients History and Physical, chart, labs and discussed the procedure including the risks, benefits and alternatives for the proposed anesthesia with the patient or authorized representative who has indicated his/her understanding and acceptance.       Plan Discussed with: CRNA  Anesthesia Plan Comments:        Anesthesia Quick Evaluation

## 2020-08-31 NOTE — Telephone Encounter (Signed)
Called and spoke to patient's wife, [name]. Informed her Charlie's surgery moved up to the first case tomorrow with Dr. Donzetta Matters and they need to arrive @ 0630. She verbalized understanding.

## 2020-08-31 NOTE — Progress Notes (Signed)
Spoke with pt and his wife, [name] for pre-op call. Pt has hx of A-fib and CAD. Pt is on Coumadin, was instructed to hold it 3 days prior to surgery. Last dose was Saturday, 08/28/20 per [name]. Pt is a type 2 Diabetic. Last A1C was 6.4 on 07/19/20. Pt states his fasting blood sugar is usually between 88-120. States it was 77 this AM. Instructed pt to take 1/2 of his regular dose of Novolin N this evening at dinner and again in the AM. He will take 15 units. Instructed pt to check his blood sugar in the AM when he gets up and every 2 hours until he leaves for the hospital. If blood sugar is >220 take 1/2 of usual correction dose of Novolin R insulin. If blood sugar is 70 or below, treat with 1/2 cup of clear juice (apple or cranberry) and recheck blood sugar 15 minutes after drinking juice. If blood sugar continues to be 70 or below, call the Short Stay department and ask to speak to a nurse. They voiced understanding.  Covid test done on 08/30/20 and it's negative. They state they have been in quarantine since the test was done. Pt does go to Dialysis and he understands to wear his mask, social distance and wash hands. They understand to stay in quarantine until he comes to the hospital tomorrow.

## 2020-09-01 ENCOUNTER — Ambulatory Visit (HOSPITAL_COMMUNITY): Payer: Medicare Other | Admitting: Anesthesiology

## 2020-09-01 ENCOUNTER — Other Ambulatory Visit: Payer: Self-pay | Admitting: *Deleted

## 2020-09-01 ENCOUNTER — Other Ambulatory Visit: Payer: Self-pay

## 2020-09-01 ENCOUNTER — Encounter (HOSPITAL_COMMUNITY): Payer: Self-pay | Admitting: Vascular Surgery

## 2020-09-01 ENCOUNTER — Ambulatory Visit (HOSPITAL_COMMUNITY)
Admission: RE | Admit: 2020-09-01 | Discharge: 2020-09-01 | Disposition: A | Discharge status: Home / Self Care | Payer: Medicare Other | Attending: Vascular Surgery | Admitting: Vascular Surgery

## 2020-09-01 ENCOUNTER — Encounter (HOSPITAL_COMMUNITY)
Admission: RE | Disposition: A | Discharge status: Home / Self Care | Payer: Self-pay | Source: Home / Self Care | Attending: Vascular Surgery

## 2020-09-01 DIAGNOSIS — I480 Paroxysmal atrial fibrillation: Secondary | ICD-10-CM | POA: Diagnosis not present

## 2020-09-01 DIAGNOSIS — N186 End stage renal disease: Secondary | ICD-10-CM | POA: Diagnosis not present

## 2020-09-01 DIAGNOSIS — G4730 Sleep apnea, unspecified: Secondary | ICD-10-CM | POA: Insufficient documentation

## 2020-09-01 DIAGNOSIS — Z992 Dependence on renal dialysis: Secondary | ICD-10-CM | POA: Insufficient documentation

## 2020-09-01 DIAGNOSIS — R0602 Shortness of breath: Secondary | ICD-10-CM | POA: Diagnosis not present

## 2020-09-01 DIAGNOSIS — Z809 Family history of malignant neoplasm, unspecified: Secondary | ICD-10-CM | POA: Insufficient documentation

## 2020-09-01 DIAGNOSIS — Z79899 Other long term (current) drug therapy: Secondary | ICD-10-CM | POA: Diagnosis not present

## 2020-09-01 DIAGNOSIS — M1909 Primary osteoarthritis, other specified site: Secondary | ICD-10-CM | POA: Diagnosis not present

## 2020-09-01 DIAGNOSIS — Z8042 Family history of malignant neoplasm of prostate: Secondary | ICD-10-CM | POA: Insufficient documentation

## 2020-09-01 DIAGNOSIS — E1122 Type 2 diabetes mellitus with diabetic chronic kidney disease: Secondary | ICD-10-CM | POA: Diagnosis not present

## 2020-09-01 DIAGNOSIS — Z841 Family history of disorders of kidney and ureter: Secondary | ICD-10-CM | POA: Diagnosis not present

## 2020-09-01 DIAGNOSIS — E785 Hyperlipidemia, unspecified: Secondary | ICD-10-CM | POA: Diagnosis not present

## 2020-09-01 DIAGNOSIS — Z833 Family history of diabetes mellitus: Secondary | ICD-10-CM | POA: Insufficient documentation

## 2020-09-01 DIAGNOSIS — Z87891 Personal history of nicotine dependence: Secondary | ICD-10-CM | POA: Insufficient documentation

## 2020-09-01 DIAGNOSIS — Z86010 Personal history of colonic polyps: Secondary | ICD-10-CM | POA: Insufficient documentation

## 2020-09-01 DIAGNOSIS — Z888 Allergy status to other drugs, medicaments and biological substances status: Secondary | ICD-10-CM | POA: Insufficient documentation

## 2020-09-01 DIAGNOSIS — Z91048 Other nonmedicinal substance allergy status: Secondary | ICD-10-CM | POA: Diagnosis not present

## 2020-09-01 DIAGNOSIS — Z885 Allergy status to narcotic agent status: Secondary | ICD-10-CM | POA: Diagnosis not present

## 2020-09-01 DIAGNOSIS — I2510 Atherosclerotic heart disease of native coronary artery without angina pectoris: Secondary | ICD-10-CM | POA: Diagnosis not present

## 2020-09-01 DIAGNOSIS — I509 Heart failure, unspecified: Secondary | ICD-10-CM | POA: Insufficient documentation

## 2020-09-01 DIAGNOSIS — Z794 Long term (current) use of insulin: Secondary | ICD-10-CM | POA: Diagnosis not present

## 2020-09-01 DIAGNOSIS — I4891 Unspecified atrial fibrillation: Secondary | ICD-10-CM | POA: Diagnosis not present

## 2020-09-01 DIAGNOSIS — Z7901 Long term (current) use of anticoagulants: Secondary | ICD-10-CM | POA: Insufficient documentation

## 2020-09-01 DIAGNOSIS — I132 Hypertensive heart and chronic kidney disease with heart failure and with stage 5 chronic kidney disease, or end stage renal disease: Secondary | ICD-10-CM | POA: Diagnosis not present

## 2020-09-01 DIAGNOSIS — N185 Chronic kidney disease, stage 5: Secondary | ICD-10-CM | POA: Diagnosis not present

## 2020-09-01 HISTORY — DX: Unspecified macular degeneration: H35.30

## 2020-09-01 HISTORY — PX: AV FISTULA PLACEMENT: SHX1204

## 2020-09-01 HISTORY — DX: Malignant (primary) neoplasm, unspecified: C80.1

## 2020-09-01 HISTORY — DX: Unspecified osteoarthritis, unspecified site: M19.90

## 2020-09-01 LAB — POCT I-STAT, CHEM 8
BUN: 37 mg/dL — ABNORMAL HIGH (ref 8–23)
Calcium, Ion: 1.12 mmol/L — ABNORMAL LOW (ref 1.15–1.40)
Chloride: 97 mmol/L — ABNORMAL LOW (ref 98–111)
Creatinine, Ser: 3.5 mg/dL — ABNORMAL HIGH (ref 0.61–1.24)
Glucose, Bld: 110 mg/dL — ABNORMAL HIGH (ref 70–99)
HCT: 43 % (ref 39.0–52.0)
Hemoglobin: 14.6 g/dL (ref 13.0–17.0)
Potassium: 3.7 mmol/L (ref 3.5–5.1)
Sodium: 139 mmol/L (ref 135–145)
TCO2: 28 mmol/L (ref 22–32)

## 2020-09-01 LAB — GLUCOSE, CAPILLARY
Glucose-Capillary: 115 mg/dL — ABNORMAL HIGH (ref 70–99)
Glucose-Capillary: 74 mg/dL (ref 70–99)

## 2020-09-01 LAB — PROTIME-INR
INR: 1.4 — ABNORMAL HIGH (ref 0.8–1.2)
Prothrombin Time: 16.5 seconds — ABNORMAL HIGH (ref 11.4–15.2)

## 2020-09-01 SURGERY — ARTERIOVENOUS (AV) FISTULA CREATION
Anesthesia: Monitor Anesthesia Care | Laterality: Left

## 2020-09-01 MED ORDER — PROPOFOL 10 MG/ML IV BOLUS
INTRAVENOUS | Status: DC | PRN
  Administered 2020-09-01: 30 mg via INTRAVENOUS

## 2020-09-01 MED ORDER — CEFAZOLIN SODIUM-DEXTROSE 2-4 GM/100ML-% IV SOLN
2.0000 g | INTRAVENOUS | Status: AC
  Administered 2020-09-01: 2 g via INTRAVENOUS

## 2020-09-01 MED ORDER — TRAMADOL HCL 50 MG PO TABS: 50.0000 mg | ORAL_TABLET | Freq: Four times a day (QID) | ORAL | 0 refills | Status: DC | PRN

## 2020-09-01 MED ORDER — SODIUM CHLORIDE 0.9 % IV SOLN
INTRAVENOUS | Status: AC
  Filled 2020-09-01: qty 1.2

## 2020-09-01 MED ORDER — FENTANYL CITRATE (PF) 100 MCG/2ML IJ SOLN: 25.0000 ug | INTRAMUSCULAR | Status: DC | PRN

## 2020-09-01 MED ORDER — CEFAZOLIN SODIUM-DEXTROSE 2-4 GM/100ML-% IV SOLN
INTRAVENOUS | Status: AC
  Filled 2020-09-01: qty 100

## 2020-09-01 MED ORDER — LIDOCAINE-EPINEPHRINE (PF) 1 %-1:200000 IJ SOLN
INTRAMUSCULAR | Status: DC | PRN
  Administered 2020-09-01: 18 mL

## 2020-09-01 MED ORDER — CHLORHEXIDINE GLUCONATE 4 % EX LIQD: 60.0000 mL | Freq: Once | CUTANEOUS | Status: DC

## 2020-09-01 MED ORDER — ACETAMINOPHEN 500 MG PO TABS: 1000.0000 mg | ORAL_TABLET | Freq: Once | ORAL | Status: AC

## 2020-09-01 MED ORDER — FENTANYL CITRATE (PF) 250 MCG/5ML IJ SOLN
INTRAMUSCULAR | Status: AC
  Filled 2020-09-01: qty 5

## 2020-09-01 MED ORDER — SODIUM CHLORIDE 0.9 % IV SOLN: INTRAVENOUS | Status: DC | PRN

## 2020-09-01 MED ORDER — MIDAZOLAM HCL 2 MG/2ML IJ SOLN
INTRAMUSCULAR | Status: DC | PRN
  Administered 2020-09-01: 2 mg via INTRAVENOUS

## 2020-09-01 MED ORDER — LIDOCAINE HCL (CARDIAC) PF 100 MG/5ML IV SOSY
PREFILLED_SYRINGE | INTRAVENOUS | Status: DC | PRN
  Administered 2020-09-01: 50 mg via INTRAVENOUS

## 2020-09-01 MED ORDER — SODIUM CHLORIDE 0.9 % IV SOLN: INTRAVENOUS | Status: DC

## 2020-09-01 MED ORDER — LACTATED RINGERS IV SOLN: INTRAVENOUS | Status: DC

## 2020-09-01 MED ORDER — CHLORHEXIDINE GLUCONATE 0.12 % MT SOLN
OROMUCOSAL | Status: AC
  Administered 2020-09-01: 15 mL via OROMUCOSAL
  Filled 2020-09-01: qty 15

## 2020-09-01 MED ORDER — 0.9 % SODIUM CHLORIDE (POUR BTL) OPTIME
TOPICAL | Status: DC | PRN
  Administered 2020-09-01: 1000 mL

## 2020-09-01 MED ORDER — LIDOCAINE-EPINEPHRINE (PF) 1 %-1:200000 IJ SOLN
INTRAMUSCULAR | Status: AC
  Filled 2020-09-01: qty 30

## 2020-09-01 MED ORDER — ONDANSETRON HCL 4 MG/2ML IJ SOLN
INTRAMUSCULAR | Status: DC | PRN
  Administered 2020-09-01: 4 mg via INTRAVENOUS

## 2020-09-01 MED ORDER — PROPOFOL 10 MG/ML IV BOLUS
INTRAVENOUS | Status: AC
  Filled 2020-09-01: qty 20

## 2020-09-01 MED ORDER — ORAL CARE MOUTH RINSE: 15.0000 mL | Freq: Once | OROMUCOSAL | Status: AC

## 2020-09-01 MED ORDER — MIDAZOLAM HCL 2 MG/2ML IJ SOLN
INTRAMUSCULAR | Status: AC
  Filled 2020-09-01: qty 2

## 2020-09-01 MED ORDER — CHLORHEXIDINE GLUCONATE 0.12 % MT SOLN: 15.0000 mL | Freq: Once | OROMUCOSAL | Status: AC

## 2020-09-01 MED ORDER — ACETAMINOPHEN 500 MG PO TABS
ORAL_TABLET | ORAL | Status: AC
  Administered 2020-09-01: 1000 mg via ORAL
  Filled 2020-09-01: qty 2

## 2020-09-01 SURGICAL SUPPLY — 29 items
ADH SKN CLS APL DERMABOND .7 (GAUZE/BANDAGES/DRESSINGS) ×1
ARMBAND PINK RESTRICT EXTREMIT (MISCELLANEOUS) ×2 IMPLANT
CANISTER SUCT 3000ML PPV (MISCELLANEOUS) ×2 IMPLANT
CLIP VESOCCLUDE MED 6/CT (CLIP) ×2 IMPLANT
CLIP VESOCCLUDE SM WIDE 6/CT (CLIP) ×2 IMPLANT
COVER PROBE W GEL 5X96 (DRAPES) ×1 IMPLANT
COVER WAND RF STERILE (DRAPES) ×1 IMPLANT
DERMABOND ADVANCED (GAUZE/BANDAGES/DRESSINGS) ×1
DERMABOND ADVANCED .7 DNX12 (GAUZE/BANDAGES/DRESSINGS) ×1 IMPLANT
ELECT REM PT RETURN 9FT ADLT (ELECTROSURGICAL) ×2
ELECTRODE REM PT RTRN 9FT ADLT (ELECTROSURGICAL) ×1 IMPLANT
GLOVE BIO SURGEON STRL SZ7.5 (GLOVE) ×2 IMPLANT
GOWN STRL REUS W/ TWL LRG LVL3 (GOWN DISPOSABLE) ×2 IMPLANT
GOWN STRL REUS W/ TWL XL LVL3 (GOWN DISPOSABLE) ×1 IMPLANT
GOWN STRL REUS W/TWL LRG LVL3 (GOWN DISPOSABLE) ×2
GOWN STRL REUS W/TWL XL LVL3 (GOWN DISPOSABLE) ×4
INSERT FOGARTY SM (MISCELLANEOUS) IMPLANT
KIT BASIN OR (CUSTOM PROCEDURE TRAY) ×2 IMPLANT
KIT TURNOVER KIT B (KITS) ×2 IMPLANT
NS IRRIG 1000ML POUR BTL (IV SOLUTION) ×2 IMPLANT
PACK CV ACCESS (CUSTOM PROCEDURE TRAY) ×2 IMPLANT
PAD ARMBOARD 7.5X6 YLW CONV (MISCELLANEOUS) ×4 IMPLANT
SUT MNCRL AB 4-0 PS2 18 (SUTURE) ×2 IMPLANT
SUT PROLENE 6 0 BV (SUTURE) ×11 IMPLANT
SUT VIC AB 3-0 SH 27 (SUTURE) ×2
SUT VIC AB 3-0 SH 27X BRD (SUTURE) ×1 IMPLANT
TOWEL GREEN STERILE (TOWEL DISPOSABLE) ×2 IMPLANT
UNDERPAD 30X36 HEAVY ABSORB (UNDERPADS AND DIAPERS) ×2 IMPLANT
WATER STERILE IRR 1000ML POUR (IV SOLUTION) ×2 IMPLANT

## 2020-09-01 NOTE — Anesthesia Procedure Notes (Signed)
Procedure Name: MAC Date/Time: 09/01/2020 8:28 AM Performed by: Claris Che, CRNA Pre-anesthesia Checklist: Patient identified, Emergency Drugs available, Suction available, Patient being monitored and Timeout performed Patient Re-evaluated:Patient Re-evaluated prior to induction Oxygen Delivery Method: Circle system utilized and Simple face mask Dental Injury: Teeth and Oropharynx as per pre-operative assessment

## 2020-09-01 NOTE — Discharge Instructions (Signed)
.  vvs  [facility]  Discharge Instructions  AV Fistula or Graft Surgery for Dialysis Access  Please refer to the following instructions for your post-procedure care. Your surgeon or physician assistant will discuss any changes with you.  Activity  You may drive the day following your surgery, if you are comfortable and no longer taking prescription pain medication. Resume full activity as the soreness in your incision resolves.  Bathing/Showering  You may shower after you go home. Keep your incision dry for 48 hours. Do not soak in a bathtub, hot tub, or swim until the incision heals completely. You may not shower if you have a hemodialysis catheter.  Incision Care  Clean your incision with mild soap and water after 48 hours. Pat the area dry with a clean towel. You do not need a bandage unless otherwise instructed. Do not apply any ointments or creams to your incision. You may have skin glue on your incision. Do not peel it off. It will come off on its own in about one week. Your arm may swell a bit after surgery. To reduce swelling use pillows to elevate your arm so it is above your heart. Your doctor will tell you if you need to lightly wrap your arm with an ACE bandage.  Diet  Resume your normal diet. There are not special food restrictions following this procedure. In order to heal from your surgery, it is CRITICAL to get adequate nutrition. Your body requires vitamins, minerals, and protein. Vegetables are the best source of vitamins and minerals. Vegetables also provide the perfect balance of protein. Processed food has little nutritional value, so try to avoid this.  Medications  Resume taking all of your medications. If your incision is causing pain, you may take over-the counter pain relievers such as acetaminophen (Tylenol). If you were prescribed a stronger pain medication, please be aware these medications can cause nausea and constipation.  Prevent nausea by taking the medication with a snack or meal. Avoid constipation by drinking plenty of fluids and eating foods with high amount of fiber, such as fruits, vegetables, and grains. Do not take Tylenol if you are taking prescription pain medications.     Follow up Your surgeon may want to see you in the office following your access surgery. If so, this will be arranged at the time of your surgery.  Please call us immediately for any of the following conditions:  Increased pain, redness, drainage (pus) from your incision site Fever of 101 degrees or higher Severe or worsening pain at your incision site Hand pain or numbness.  Reduce your risk of vascular disease:  Stop smoking. If you would like help, call [facility] at 1-800-QUIT-NOW 929-177-0032) or [facility] [place] your cholesterol Maintain a desired weight Control your diabetes Keep your blood pressure down  Dialysis  It will take several weeks to several months for your new dialysis access to be ready for use. Your surgeon will determine when it is OK to use it. Your nephrologist will continue to direct your dialysis. You can continue to use your Permcath until your new access is ready for use.  If you have any questions, please call the office at [phone].

## 2020-09-01 NOTE — Transfer of Care (Signed)
Immediate Anesthesia Transfer of Care Note  Patient: Louis [name]  Procedure(s) Performed: LEFT ARM ARTERIOVENOUS (AV) FISTULA (Left )  Patient Location: PACU  Anesthesia Type:MAC  Level of Consciousness: awake, alert , oriented and patient cooperative  Airway & Oxygen Therapy: Patient Spontanous Breathing  Post-op Assessment: Report given to RN, Post -op Vital signs reviewed and stable and Patient moving all extremities X 4  Post vital signs: Reviewed and stable  Last Vitals:  Vitals Value Taken Time  BP 124/67 09/01/20 0936  Temp    Pulse 68 09/01/20 0938  Resp 16 09/01/20 0938  SpO2 100 % 09/01/20 0938  Vitals shown include unvalidated device data.  Last Pain:  Vitals:   09/01/20 0731  TempSrc:   PainSc: 0-No pain         Complications: No complications documented.

## 2020-09-01 NOTE — Op Note (Signed)
    Patient name: Louis [name] MRN: [id number] DOB: [birth year] Sex: male  09/01/2020 Pre-operative Diagnosis: End-stage renal disease Post-operative diagnosis:  Same Surgeon:  Erlene Quan C. Donzetta Matters, MD Assistant: Risa Grill, PA-C Procedure Performed: Creation of left brachial artery to basilic vein fistula below the antecubitum  Indications: 74 year old male with history of end-stage renal disease he currently dialyzes via right IJ tunnel catheter.  He is indicated for permanent access.  He is right-hand dominant.  Findings: By on table ultrasound there was a very diminutive cephalic vein.  Basilic vein measured least 4 mm external diameter as it did grossly.  At completion there was a very strong thrill and a palpable radial artery pulse both confirmed with Doppler.   Procedure:  The patient was identified in the holding area and taken to the operating room where is placed supine on upper table and MAC anesthesia was induced.  He was sterilely prepped and draped in the left upper extremity usual fashion antibiotics were administered timeout was called.  Ultrasound was used we identified a suitable basilic vein above and below the antecubital but no suitable cephalic vein.  The area was anesthetized 1% lidocaine transverse incision was created.  We dissected down to the vein marked if orientation.  We dissected through the deep fascia to the brachial artery placed a vessel loop around this.  The vein was transected distally and tied off flush with heparinized saline and clamped.  The arteries clamped distally proximally opened longitudinally flushed with heparinized saline distally only.  We then sewed the vein end-to-side with 6-0 Prolene suture.  Prior completion allowed flushing all directions.  Upon completion we did have to place a few repair sutures on the toe we did obtain hemostasis with that.  We confirmed flow with Doppler he also had a radial artery pulse was palpable confirmed with Doppler.   We irrigated the wound we closed in layers of Vicryl Monocryl.  Dermabond is placed at the level of the skin.  He was awakened from anesthesia having tolerated procedure without immediate complication.  All counts were correct at completion.  EBL: 50 cc    Vallen Calabrese C. Donzetta Matters, MD [facility] Office: [phone] Pager: [phone]

## 2020-09-01 NOTE — Interval H&P Note (Signed)
History and Physical Interval Note:  09/01/2020 8:22 AM  Louis [name]  has presented today for surgery, with the diagnosis of END STAGE RENAL DISEASE.  The various methods of treatment have been discussed with the patient and family. After consideration of risks, benefits and other options for treatment, the patient has consented to  Procedure(s): LEFT ARM ARTERIOVENOUS (AV) FISTULA VERSUS ARTERIOVENOUS GORE-TEX GRAFT (Left) as a surgical intervention.  The patient's history has been reviewed, patient examined, no change in status, stable for surgery.  I have reviewed the patient's chart and labs.  Questions were answered to the patient's satisfaction.     [name]

## 2020-09-02 ENCOUNTER — Encounter (HOSPITAL_COMMUNITY): Payer: Self-pay | Admitting: Vascular Surgery

## 2020-09-02 DIAGNOSIS — E876 Hypokalemia: Secondary | ICD-10-CM | POA: Diagnosis not present

## 2020-09-02 DIAGNOSIS — N186 End stage renal disease: Secondary | ICD-10-CM | POA: Diagnosis not present

## 2020-09-02 DIAGNOSIS — D509 Iron deficiency anemia, unspecified: Secondary | ICD-10-CM | POA: Diagnosis not present

## 2020-09-02 DIAGNOSIS — D631 Anemia in chronic kidney disease: Secondary | ICD-10-CM | POA: Diagnosis not present

## 2020-09-02 DIAGNOSIS — N2581 Secondary hyperparathyroidism of renal origin: Secondary | ICD-10-CM | POA: Diagnosis not present

## 2020-09-02 DIAGNOSIS — Z992 Dependence on renal dialysis: Secondary | ICD-10-CM | POA: Diagnosis not present

## 2020-09-02 NOTE — Anesthesia Postprocedure Evaluation (Signed)
Anesthesia Post Note  Patient: Louis [name]  Procedure(s) Performed: LEFT ARM ARTERIOVENOUS (AV) FISTULA (Left )     Patient location during evaluation: PACU Anesthesia Type: MAC Level of consciousness: awake and alert Pain management: pain level controlled Vital Signs Assessment: post-procedure vital signs reviewed and stable Respiratory status: spontaneous breathing, nonlabored ventilation, respiratory function stable and patient connected to nasal cannula oxygen Cardiovascular status: stable and blood pressure returned to baseline Postop Assessment: no apparent nausea or vomiting Anesthetic complications: no   No complications documented.  Last Vitals:  Vitals:   09/01/20 0935 09/01/20 0950  BP: 124/67 (!) 129/51  Pulse: 71 64  Resp: 10 15  Temp: (!) 36.3 C   SpO2: 98% 100%    Last Pain:  Vitals:   09/01/20 0935  TempSrc:   PainSc: 0-No pain                 Tiajuana Amass

## 2020-09-03 DIAGNOSIS — D509 Iron deficiency anemia, unspecified: Secondary | ICD-10-CM | POA: Diagnosis not present

## 2020-09-03 DIAGNOSIS — Z992 Dependence on renal dialysis: Secondary | ICD-10-CM | POA: Diagnosis not present

## 2020-09-03 DIAGNOSIS — E876 Hypokalemia: Secondary | ICD-10-CM | POA: Diagnosis not present

## 2020-09-03 DIAGNOSIS — N2581 Secondary hyperparathyroidism of renal origin: Secondary | ICD-10-CM | POA: Diagnosis not present

## 2020-09-03 DIAGNOSIS — N186 End stage renal disease: Secondary | ICD-10-CM | POA: Diagnosis not present

## 2020-09-03 DIAGNOSIS — D631 Anemia in chronic kidney disease: Secondary | ICD-10-CM | POA: Diagnosis not present

## 2020-09-07 DIAGNOSIS — N186 End stage renal disease: Secondary | ICD-10-CM | POA: Diagnosis not present

## 2020-09-07 DIAGNOSIS — D631 Anemia in chronic kidney disease: Secondary | ICD-10-CM | POA: Diagnosis not present

## 2020-09-07 DIAGNOSIS — Z992 Dependence on renal dialysis: Secondary | ICD-10-CM | POA: Diagnosis not present

## 2020-09-07 DIAGNOSIS — D509 Iron deficiency anemia, unspecified: Secondary | ICD-10-CM | POA: Diagnosis not present

## 2020-09-07 DIAGNOSIS — E876 Hypokalemia: Secondary | ICD-10-CM | POA: Diagnosis not present

## 2020-09-07 DIAGNOSIS — N2581 Secondary hyperparathyroidism of renal origin: Secondary | ICD-10-CM | POA: Diagnosis not present

## 2020-09-08 ENCOUNTER — Ambulatory Visit (INDEPENDENT_AMBULATORY_CARE_PROVIDER_SITE_OTHER): Payer: Medicare Other | Admitting: Physician Assistant

## 2020-09-08 VITALS — BP 115/45 | HR 94 | Wt 228.0 lb

## 2020-09-08 DIAGNOSIS — I480 Paroxysmal atrial fibrillation: Secondary | ICD-10-CM

## 2020-09-08 LAB — POCT INR: INR: 1.7 — AB (ref 2.0–3.0)

## 2020-09-08 NOTE — Progress Notes (Signed)
Increased to 5mg  daily. Recheck in 1 week.

## 2020-09-09 DIAGNOSIS — E1122 Type 2 diabetes mellitus with diabetic chronic kidney disease: Secondary | ICD-10-CM | POA: Diagnosis not present

## 2020-09-09 DIAGNOSIS — D509 Iron deficiency anemia, unspecified: Secondary | ICD-10-CM | POA: Diagnosis not present

## 2020-09-09 DIAGNOSIS — E113213 Type 2 diabetes mellitus with mild nonproliferative diabetic retinopathy with macular edema, bilateral: Secondary | ICD-10-CM | POA: Diagnosis not present

## 2020-09-09 DIAGNOSIS — E876 Hypokalemia: Secondary | ICD-10-CM | POA: Diagnosis not present

## 2020-09-09 DIAGNOSIS — N186 End stage renal disease: Secondary | ICD-10-CM | POA: Diagnosis not present

## 2020-09-09 DIAGNOSIS — D631 Anemia in chronic kidney disease: Secondary | ICD-10-CM | POA: Diagnosis not present

## 2020-09-09 DIAGNOSIS — N2581 Secondary hyperparathyroidism of renal origin: Secondary | ICD-10-CM | POA: Diagnosis not present

## 2020-09-09 DIAGNOSIS — Z992 Dependence on renal dialysis: Secondary | ICD-10-CM | POA: Diagnosis not present

## 2020-09-09 LAB — CBC
Lymphocytes: 6.8
MCV: 101 (ref 76–111)
Neutrophils: 82.5
RBC: 3.48 — AB (ref 3.87–5.11)
RDW: 16.1

## 2020-09-09 LAB — BASIC METABOLIC PANEL
Glucose: 196
Potassium: 3.3 — AB (ref 3.4–5.3)
Sodium: 146 (ref 137–147)

## 2020-09-09 LAB — CBC AND DIFFERENTIAL
HCT: 35 — AB (ref 41–53)
Hemoglobin: 11.3 — AB (ref 13.5–17.5)
WBC: 11.4

## 2020-09-09 LAB — COMPREHENSIVE METABOLIC PANEL: Albumin: 2.9 — AB (ref 3.5–5.0)

## 2020-09-09 LAB — IRON,TIBC AND FERRITIN PANEL
Iron: 22
UIBC: 142

## 2020-09-10 LAB — HEMOGLOBIN A1C: Hemoglobin A1C: 6.5

## 2020-09-11 DIAGNOSIS — Z992 Dependence on renal dialysis: Secondary | ICD-10-CM | POA: Diagnosis not present

## 2020-09-11 DIAGNOSIS — D509 Iron deficiency anemia, unspecified: Secondary | ICD-10-CM | POA: Diagnosis not present

## 2020-09-11 DIAGNOSIS — N186 End stage renal disease: Secondary | ICD-10-CM | POA: Diagnosis not present

## 2020-09-11 DIAGNOSIS — D631 Anemia in chronic kidney disease: Secondary | ICD-10-CM | POA: Diagnosis not present

## 2020-09-11 DIAGNOSIS — N2581 Secondary hyperparathyroidism of renal origin: Secondary | ICD-10-CM | POA: Diagnosis not present

## 2020-09-11 DIAGNOSIS — E876 Hypokalemia: Secondary | ICD-10-CM | POA: Diagnosis not present

## 2020-09-14 DIAGNOSIS — D509 Iron deficiency anemia, unspecified: Secondary | ICD-10-CM | POA: Diagnosis not present

## 2020-09-14 DIAGNOSIS — E876 Hypokalemia: Secondary | ICD-10-CM | POA: Diagnosis not present

## 2020-09-14 DIAGNOSIS — N2581 Secondary hyperparathyroidism of renal origin: Secondary | ICD-10-CM | POA: Diagnosis not present

## 2020-09-14 DIAGNOSIS — D631 Anemia in chronic kidney disease: Secondary | ICD-10-CM | POA: Diagnosis not present

## 2020-09-14 DIAGNOSIS — Z992 Dependence on renal dialysis: Secondary | ICD-10-CM | POA: Diagnosis not present

## 2020-09-14 DIAGNOSIS — N186 End stage renal disease: Secondary | ICD-10-CM | POA: Diagnosis not present

## 2020-09-15 ENCOUNTER — Encounter: Payer: Self-pay | Admitting: Family Medicine

## 2020-09-15 ENCOUNTER — Telehealth: Payer: Self-pay | Admitting: Family Medicine

## 2020-09-15 ENCOUNTER — Ambulatory Visit (INDEPENDENT_AMBULATORY_CARE_PROVIDER_SITE_OTHER): Payer: Medicare Other | Admitting: Family Medicine

## 2020-09-15 ENCOUNTER — Ambulatory Visit: Payer: Self-pay | Admitting: *Deleted

## 2020-09-15 VITALS — BP 124/52 | HR 64 | Ht 71.0 in | Wt 203.0 lb

## 2020-09-15 DIAGNOSIS — D688 Other specified coagulation defects: Secondary | ICD-10-CM

## 2020-09-15 DIAGNOSIS — M1A9XX1 Chronic gout, unspecified, with tophus (tophi): Secondary | ICD-10-CM

## 2020-09-15 DIAGNOSIS — E441 Mild protein-calorie malnutrition: Secondary | ICD-10-CM | POA: Diagnosis not present

## 2020-09-15 DIAGNOSIS — I480 Paroxysmal atrial fibrillation: Secondary | ICD-10-CM | POA: Diagnosis not present

## 2020-09-15 DIAGNOSIS — I2510 Atherosclerotic heart disease of native coronary artery without angina pectoris: Secondary | ICD-10-CM | POA: Diagnosis not present

## 2020-09-15 DIAGNOSIS — N2581 Secondary hyperparathyroidism of renal origin: Secondary | ICD-10-CM

## 2020-09-15 DIAGNOSIS — I5031 Acute diastolic (congestive) heart failure: Secondary | ICD-10-CM | POA: Diagnosis not present

## 2020-09-15 DIAGNOSIS — E11610 Type 2 diabetes mellitus with diabetic neuropathic arthropathy: Secondary | ICD-10-CM | POA: Diagnosis not present

## 2020-09-15 DIAGNOSIS — I10 Essential (primary) hypertension: Secondary | ICD-10-CM | POA: Diagnosis not present

## 2020-09-15 LAB — POCT GLYCOSYLATED HEMOGLOBIN (HGB A1C): Hemoglobin A1C: 6.7 % — AB (ref 4.0–5.6)

## 2020-09-15 LAB — POCT INR: INR: 2.2 (ref 2.0–3.0)

## 2020-09-15 MED ORDER — GABAPENTIN 300 MG PO CAPS
300.0000 mg | ORAL_CAPSULE | Freq: Every day | ORAL | 1 refills | Status: DC
Start: 2020-09-15 — End: 2020-12-17

## 2020-09-15 NOTE — Assessment & Plan Note (Signed)
Due to recheck uric acid levels.  Hopefully if it looks fantastic like it did last year we can actually reduce his allopurinol dose.

## 2020-09-15 NOTE — Assessment & Plan Note (Signed)
He is doing well overall blood pressure looks great today.  Even though he is noted a few lower blood pressures at home he wants to stay on the 5 mg for now.  When he runs out of the 2.5 so give [place] a call back and we can switch it to the 5 mg dose.

## 2020-09-15 NOTE — Assessment & Plan Note (Signed)
Being managed by nephrology.  Recent PTH elevated.

## 2020-09-15 NOTE — Progress Notes (Signed)
Error

## 2020-09-15 NOTE — Assessment & Plan Note (Signed)
INR at goal today.  Repeat in 2 weeks.

## 2020-09-15 NOTE — Progress Notes (Signed)
Established Patient Office Visit  Subjective:  Patient ID: Louis [name], male    DOB: January 12, 1946  Age: 74 y.o. MRN: [id number]  CC:  Chief Complaint  Patient presents with  . Diabetes  . Coagulation Disorder    HPI [name] presents for  Diabetes - no hypoglycemic events. No wounds or sores that are not healing well. No increased thirst or urination. Checking glucose at home. Taking medications as prescribed without any side effects.  He did bring in home glucose log.  The only thing that he is having a little bit of difficulty with is that he is not able to eat lunch on dialysis days because of Covid they have not allowed patients to eat or snack while there or there so he is just been skipping his short acting in the morning but then usually the sugars running a little bit higher at bedtime.  He also admits that he has been getting into some sugary candy for Halloween that he has been eating at night.  F/U INR -follow-up for Coumadin with atrial fibrillation-has been taking his medications regularly.  No recent changes or problems no medication changes.  Hypertension- Pt denies chest pain, SOB, dizziness, or heart palpitations.  Taking meds as directed w/o problems.  Denies medication side effects.  Wife is a little concerned that the blood pressure has been dropping a little bit lower.  They recently they did adjust how much fluid they were taking off because he actually had some weight loss and has been doing a little bit better.  Gout-is not any recent flares.  He is currently taking one half tabs of allopurinol the morning and 1 tab in the evening.  Due to recheck that uric acid level his goal is to try to reduce any medications that are not necessary.     Past Medical History:  Diagnosis Date  . Arthritis   . Brittle bones    per pt, has soft bones in right foot/wears boot cast!  . CAD (coronary artery disease)   . Cancer ([place])    skin cancer on arm  . Cataract     Bil/ surg scheduled for right eye 01/18/17/ left eye 02/08/17  . Charcot ankle, right 2019  . CHF (congestive heart failure) ([place]) 2015  . Diabetes mellitus   . Heart failure, diastolic ([place])   . Hyperlipidemia   . Hypertension   . Macular degeneration disease   . Macular edema 2014  . OSA on CPAP   . Paroxysmal atrial fibrillation (HCC)   . Personal history of colonic polyps - adenomas 01/28/2014  . Renal insufficiency    Tu/Th/Sa Dialysis  . Shortness of breath dyspnea   . Syncope and collapse     Past Surgical History:  Procedure Laterality Date  . AV FISTULA PLACEMENT Left 09/01/2020   Procedure: LEFT ARM ARTERIOVENOUS (AV) FISTULA;  Surgeon: Waynetta Sandy, MD;  Location: [facility];  Service: Vascular;  Laterality: Left;  . CARPAL TUNNEL RELEASE     left hand  . COLONOSCOPY    . INTRAVASCULAR PRESSURE WIRE/FFR STUDY N/A 12/18/2018   Procedure: INTRAVASCULAR PRESSURE WIRE/FFR STUDY;  Surgeon: Wellington Hampshire, MD;  Location: [facility];  Service: Cardiovascular;  Laterality: N/A;  . IR FLUORO GUIDE CV LINE RIGHT  12/16/2018  . IR US GUIDE VASC ACCESS RIGHT  12/16/2018  . KNEE ARTHROSCOPY Right 09/13/2016   [facility], Dr. Dorna Leitz  . PILONIDAL CYST EXCISION    .  RIGHT/LEFT HEART CATH AND CORONARY ANGIOGRAPHY N/A 12/18/2018   Procedure: RIGHT/LEFT HEART CATH AND CORONARY ANGIOGRAPHY;  Surgeon: Wellington Hampshire, MD;  Location: [facility];  Service: Cardiovascular;  Laterality: N/A;    Family History  Problem Relation Age of Onset  . Diabetes Mother   . Kidney disease Mother   . Diabetes Father   . Coronary artery disease Father   . Diabetes Brother   . Diabetes Sister   . Prostate cancer Maternal Uncle   . Diabetes Maternal Grandmother   . Cancer Paternal Grandmother        unknown  . Heart attack Paternal Grandfather   . Colon cancer Neg Hx     Social History   Socioeconomic History  . Marital status: Married    Spouse name:  [name]  . Number of children: 5  . Years of education: 75  . Highest education level: 12th grade  Occupational History  . Occupation: Warehouse    Comment: retired  Tobacco Use  . Smoking status: Former Smoker    Packs/day: 0.50    Years: 20.00    Pack years: 10.00    Types: Cigarettes    Quit date: 03/06/1977    Years since quitting: 43.5  . Smokeless tobacco: Never Used  Vaping Use  . Vaping Use: Never used  Substance and Sexual Activity  . Alcohol use: No  . Drug use: No  . Sexual activity: Not Currently  Other Topics Concern  . Not on file  Social History Narrative   Drinks a cup of coffee daily. Drinks a lot of water daily. Goes out with church members during the week   Social Determinants of Health   Financial Resource Strain:   . Difficulty of Paying Living Expenses: Not on file  Food Insecurity:   . Worried About Charity fundraiser in the Last Year: Not on file  . Ran Out of Food in the Last Year: Not on file  Transportation Needs:   . Lack of Transportation (Medical): Not on file  . Lack of Transportation (Non-Medical): Not on file  Physical Activity:   . Days of Exercise per Week: Not on file  . Minutes of Exercise per Session: Not on file  Stress:   . Feeling of Stress : Not on file  Social Connections:   . Frequency of Communication with Friends and Family: Not on file  . Frequency of Social Gatherings with Friends and Family: Not on file  . Attends Religious Services: Not on file  . Active Member of Clubs or Organizations: Not on file  . Attends Archivist Meetings: Not on file  . Marital Status: Not on file  Intimate Partner Violence:   . Fear of Current or Ex-Partner: Not on file  . Emotionally Abused: Not on file  . Physically Abused: Not on file  . Sexually Abused: Not on file    Outpatient Medications Prior to Visit  Medication Sig Dispense Refill  . allopurinol (ZYLOPRIM) 100 MG tablet TAKE 1 TO 1 & 1/2 (ONE & ONE-HALF) TABLETS BY  MOUTH TWICE DAILY (Patient taking differently: Take 100-150 mg by mouth See admin instructions. Take 150 mg in the morning and 100 mg at bedtime) 270 tablet 1  . AMBULATORY NON FORMULARY MEDICATION Continuous positive airway pressure (CPAP) device: Auto titrate minimum 4 cm H20 to maximum of 20 cm H2O with pressure. Please provide all supplemental supplies as needed. Fax to: [phone] 1 Units 1  . AMBULATORY  NON FORMULARY MEDICATION Shower chair - Dx: M25.561, M54.12, M10.00 - Fax to - [phone] 1 each 0  . AMBULATORY NON FORMULARY MEDICATION Rollator walker with seat. Dx: M25.561, M54.12, M10.00 - Fax to - [phone] 1 each 0  . amitriptyline (ELAVIL) 25 MG tablet TAKE 1 TO 2 TABLETS BY MOUTH AT BEDTIME 90 tablet 1  . amLODipine (NORVASC) 2.5 MG tablet Take 1 tablet (2.5 mg total) by mouth daily. Pt is taking this in the morning (Patient taking differently: Take 5 mg by mouth daily. ) 90 tablet 0  . B-D INS SYR ULTRAFINE 1CC/31G 31G X 5/16" 1 ML MISC     . blood glucose meter kit and supplies Use to check blood sugars daily E11.65    . Cholecalciferol (VITAMIN D3) 50 MCG (2000 UT) TABS Take 2,000 Units by mouth daily.     . Coenzyme Q10 (CO Q 10) 100 MG CAPS Take 200 mg by mouth daily.     . Continuous Blood Gluc Receiver (FREESTYLE LIBRE 14 DAY READER) DEVI Dx DM E11.22 Check blood sugar 4 times daily. 1 each prn  . Continuous Blood Gluc Sensor (FREESTYLE LIBRE 14 DAY SENSOR) MISC Dx DM E11.22 Check blood sugar 4 times daily. 1 each prn  . furosemide (LASIX) 80 MG tablet Take 160 mg by mouth 2 (two) times daily.     . hydrALAZINE (APRESOLINE) 100 MG tablet Take 1 tablet (100 mg total) by mouth 3 (three) times daily. 270 tablet 3  . isosorbide dinitrate (ISORDIL) 20 MG tablet Take 1 tablet by mouth twice daily (Patient taking differently: Take 20 mg by mouth 2 (two) times daily. ) 60 tablet 1  . metolazone (ZAROXOLYN) 5 MG tablet Take 5 mg by mouth daily as needed (excess fluid).     .  nitroGLYCERIN (NITROSTAT) 0.4 MG SL tablet Place 1 tablet (0.4 mg total) under the tongue every 5 (five) minutes as needed for chest pain. 25 tablet 0  . NOVOLIN N 100 UNIT/ML injection Inject 30 Units into the skin 2 (two) times daily before a meal.     . NOVOLIN R 100 UNIT/ML injection Inject 5-20 Units into the skin 3 (three) times daily with meals. Sliding Scale  5  . omega-3 acid ethyl esters (LOVAZA) 1 g capsule Take 2,150 mg by mouth daily.    Glory Rosebush ULTRA test strip USE 1 STRIP TO CHECK GLUCOSE 3 TIMES DAILY. E11.22 300 each 11  . Probiotic Product (PROBIOTIC DAILY PO) Take 420 mg by mouth daily.    . rosuvastatin (CRESTOR) 40 MG tablet Take 1 tablet (40 mg total) by mouth daily. 90 tablet 3  . traMADol (ULTRAM) 50 MG tablet Take 1 tablet (50 mg total) by mouth every 6 (six) hours as needed. 16 tablet 0  . VITAMIN E PO Take 22.5 mg by mouth daily.     Marland Kitchen warfarin (COUMADIN) 5 MG tablet TAKE 1 TABLET BY MOUTH ONCE DAILY AT  6PM  MONITOR  INR  GOAL  SHOULD  BE  BETWEEN  2-3 (Patient taking differently: Take 2.5-5 mg by mouth See admin instructions. Take 2.5 mg on TURSDAY, and all the other days take 5 mg at bedtime  MONITOR  INR  GOAL  SHOULD  BE  BETWEEN  2-3) 90 tablet 0  . ZINC SULFATE PO Take 30 mg by mouth daily.     Marland Kitchen gabapentin (NEURONTIN) 300 MG capsule Take 1 capsule by mouth once daily (Patient taking differently: Take 300 mg by  mouth daily. ) 90 capsule 1   No facility-administered medications prior to visit.    Allergies  Allergen Reactions  . Hydrocodone Nausea And Vomiting    Other reaction(s): GI Upset (intolerance) Projectile vomiting   . Oxycodone Nausea And Vomiting    Other reaction(s): GI Upset (intolerance), Vomiting (intolerance) Projectile vomiting   . Dacarbazine Other (See Comments)    Other reaction(s): Unknown  . Tape Dermatitis, Itching and Rash    Patch used at dialysis    ROS Review of Systems    Objective:    Physical  Exam Constitutional:      Appearance: He is well-developed.  HENT:     Head: Normocephalic and atraumatic.  Cardiovascular:     Rate and Rhythm: Normal rate and regular rhythm.     Heart sounds: Normal heart sounds.  Pulmonary:     Effort: Pulmonary effort is normal.     Breath sounds: Normal breath sounds.  Skin:    General: Skin is warm and dry.  Neurological:     Mental Status: He is alert and oriented to person, place, and time.  Psychiatric:        Behavior: Behavior normal.     BP (!) 124/52   Pulse 64   Ht 5' 11" (1.803 m)   Wt 203 lb (92.1 kg)   SpO2 100%   BMI 28.31 kg/m  Wt Readings from Last 3 Encounters:  09/15/20 203 lb (92.1 kg)  09/08/20 228 lb (103.4 kg)  08/25/20 226 lb (102.5 kg)     There are no preventive care reminders to display for this patient.  There are no preventive care reminders to display for this patient.  Lab Results  Component Value Date   TSH 2.391 02/02/2017   Lab Results  Component Value Date   WBC 7.5 03/08/2020   HGB 14.6 09/01/2020   HCT 43.0 09/01/2020   MCV 91.4 03/08/2020   PLT 234 03/08/2020   Lab Results  Component Value Date   NA 139 09/01/2020   K 3.7 09/01/2020   CO2 27 10/27/2019   GLUCOSE 110 (H) 09/01/2020   BUN 37 (H) 09/01/2020   CREATININE 3.50 (H) 09/01/2020   BILITOT 0.8 05/28/2020   ALKPHOS 105 10/27/2019   AST 23 05/28/2020   ALT 17 05/28/2020   PROT 6.2 05/28/2020   ALBUMIN 3.1 (L) 10/27/2019   CALCIUM 9.2 10/27/2019   ANIONGAP 13 10/27/2019   GFR 38.90 (L) 03/10/2011   Lab Results  Component Value Date   CHOL 118 05/28/2020   Lab Results  Component Value Date   HDL 38 (L) 05/28/2020   Lab Results  Component Value Date   LDLCALC 56 05/28/2020   Lab Results  Component Value Date   TRIG 161 (H) 05/28/2020   Lab Results  Component Value Date   CHOLHDL 3.1 05/28/2020   Lab Results  Component Value Date   HGBA1C 6.7 (A) 09/15/2020      Assessment & Plan:   Problem List  Items Addressed This Visit      Cardiovascular and Mediastinum   HYPERTENSION, BENIGN SYSTEMIC    He is doing well overall blood pressure looks great today.  Even though he is noted a few lower blood pressures at home he wants to stay on the 5 mg for now.  When he runs out of the 2.5 so give [place] a call back and we can switch it to the 5 mg dose.      Atrial  fibrillation ([place])    INR at goal today.  Repeat in 2 weeks.      Relevant Orders   POCT INR (Completed)   Acute diastolic CHF (congestive heart failure) (HCC)    No sign of volume overload on exam today.        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  Hemoglobin A1c up slightly to 6.7 from 6.4 but again he has been eating a little candy in the evenings but otherwise I feel like he is doing fantastic.  Just really encouraged him to not overdo it with the candy especially as we move into the holidays.      Relevant Medications   gabapentin (NEURONTIN) 300 MG capsule   Other Relevant Orders   POCT glycosylated hemoglobin (Hb A1C) (Completed)   Secondary hyperparathyroidism of renal origin [facility])    Being managed by nephrology.  Recent PTH elevated.        Hematopoietic and Hemostatic   Other specified coagulation defects ([place])    No adjustment to Coumadin needed today.  Recheck INR in 2 weeks.        Other   Unspecified protein-calorie malnutrition ([place])   Gout    Due to recheck uric acid levels.  Hopefully if it looks fantastic like it did last year we can actually reduce his allopurinol dose.         Meds ordered this encounter  Medications  . gabapentin (NEURONTIN) 300 MG capsule    Sig: Take 1 capsule (300 mg total) by mouth daily.    Dispense:  90 capsule    Refill:  1    Follow-up: Return in about 3 months (around 12/16/2020) for Diabetes follow-up.    [name], MD

## 2020-09-15 NOTE — Telephone Encounter (Signed)
Can you call his dialysis center and talk to the nurse to see if we can get a uric acid level drawn tomorrow when he gets his blood work done.

## 2020-09-15 NOTE — Assessment & Plan Note (Signed)
Overall doing really well.  Hemoglobin A1c up slightly to 6.7 from 6.4 but again he has been eating a little candy in the evenings but otherwise I feel like he is doing fantastic.  Just really encouraged him to not overdo it with the candy especially as we move into the holidays.

## 2020-09-15 NOTE — Patient Instructions (Addendum)
INR looks great today so continue current regimen with Coumadin.  F/U in 2 weeks for coumadin check

## 2020-09-15 NOTE — Assessment & Plan Note (Signed)
No recent changes or sign of volume overload on exam.

## 2020-09-15 NOTE — Assessment & Plan Note (Signed)
No sign of volume overload on exam today. °

## 2020-09-15 NOTE — Assessment & Plan Note (Signed)
No adjustment to Coumadin needed today.  Recheck INR in 2 weeks.

## 2020-09-15 NOTE — Progress Notes (Signed)
Louis [name] [phone] to obtain labs (Uric Acid). No answer  Faxed a Medical records request for recent labs to [phone]

## 2020-09-16 DIAGNOSIS — D509 Iron deficiency anemia, unspecified: Secondary | ICD-10-CM | POA: Diagnosis not present

## 2020-09-16 DIAGNOSIS — E876 Hypokalemia: Secondary | ICD-10-CM | POA: Diagnosis not present

## 2020-09-16 DIAGNOSIS — D631 Anemia in chronic kidney disease: Secondary | ICD-10-CM | POA: Diagnosis not present

## 2020-09-16 DIAGNOSIS — Z992 Dependence on renal dialysis: Secondary | ICD-10-CM | POA: Diagnosis not present

## 2020-09-16 DIAGNOSIS — N186 End stage renal disease: Secondary | ICD-10-CM | POA: Diagnosis not present

## 2020-09-16 DIAGNOSIS — N2581 Secondary hyperparathyroidism of renal origin: Secondary | ICD-10-CM | POA: Diagnosis not present

## 2020-09-16 NOTE — Telephone Encounter (Signed)
Spoke w/[facility] and he informed me that the Uric acid cannot be done unless his nephrologist Dr. Moshe Cipro ordered this due to the billing issues.  Called Dr. Shelva Majestic office to ask if this can be done. [phone].  Was on hold for 24 mins waiting for someone to pick up.

## 2020-09-16 NOTE — Telephone Encounter (Signed)
LVM advising pt of recommendations. And stated that the lab will be faxed downstairs.   Advised to rtn call to the office if there are any questions.

## 2020-09-16 NOTE — Telephone Encounter (Signed)
Okay, thank you for trying.  Please call patient and see if he would be willing to come by and have a blood drawn here for the uric acid.

## 2020-09-17 ENCOUNTER — Encounter: Payer: Self-pay | Admitting: Family Medicine

## 2020-09-18 DIAGNOSIS — D631 Anemia in chronic kidney disease: Secondary | ICD-10-CM | POA: Diagnosis not present

## 2020-09-18 DIAGNOSIS — N2581 Secondary hyperparathyroidism of renal origin: Secondary | ICD-10-CM | POA: Diagnosis not present

## 2020-09-18 DIAGNOSIS — E876 Hypokalemia: Secondary | ICD-10-CM | POA: Diagnosis not present

## 2020-09-18 DIAGNOSIS — N186 End stage renal disease: Secondary | ICD-10-CM | POA: Diagnosis not present

## 2020-09-18 DIAGNOSIS — Z992 Dependence on renal dialysis: Secondary | ICD-10-CM | POA: Diagnosis not present

## 2020-09-18 DIAGNOSIS — D509 Iron deficiency anemia, unspecified: Secondary | ICD-10-CM | POA: Diagnosis not present

## 2020-09-20 DIAGNOSIS — M1A9XX1 Chronic gout, unspecified, with tophus (tophi): Secondary | ICD-10-CM | POA: Diagnosis not present

## 2020-09-20 DIAGNOSIS — E1122 Type 2 diabetes mellitus with diabetic chronic kidney disease: Secondary | ICD-10-CM | POA: Diagnosis not present

## 2020-09-20 DIAGNOSIS — N186 End stage renal disease: Secondary | ICD-10-CM | POA: Diagnosis not present

## 2020-09-20 DIAGNOSIS — Z992 Dependence on renal dialysis: Secondary | ICD-10-CM | POA: Diagnosis not present

## 2020-09-21 DIAGNOSIS — E876 Hypokalemia: Secondary | ICD-10-CM | POA: Diagnosis not present

## 2020-09-21 DIAGNOSIS — D509 Iron deficiency anemia, unspecified: Secondary | ICD-10-CM | POA: Diagnosis not present

## 2020-09-21 DIAGNOSIS — N2581 Secondary hyperparathyroidism of renal origin: Secondary | ICD-10-CM | POA: Diagnosis not present

## 2020-09-21 DIAGNOSIS — E1122 Type 2 diabetes mellitus with diabetic chronic kidney disease: Secondary | ICD-10-CM | POA: Diagnosis not present

## 2020-09-21 DIAGNOSIS — N186 End stage renal disease: Secondary | ICD-10-CM | POA: Diagnosis not present

## 2020-09-21 DIAGNOSIS — Z992 Dependence on renal dialysis: Secondary | ICD-10-CM | POA: Diagnosis not present

## 2020-09-21 DIAGNOSIS — Z23 Encounter for immunization: Secondary | ICD-10-CM | POA: Diagnosis not present

## 2020-09-21 DIAGNOSIS — D631 Anemia in chronic kidney disease: Secondary | ICD-10-CM | POA: Diagnosis not present

## 2020-09-21 LAB — URIC ACID: Uric Acid, Serum: 5.2 mg/dL (ref 4.0–8.0)

## 2020-09-23 DIAGNOSIS — E1122 Type 2 diabetes mellitus with diabetic chronic kidney disease: Secondary | ICD-10-CM | POA: Diagnosis not present

## 2020-09-23 DIAGNOSIS — D631 Anemia in chronic kidney disease: Secondary | ICD-10-CM | POA: Diagnosis not present

## 2020-09-23 DIAGNOSIS — E876 Hypokalemia: Secondary | ICD-10-CM | POA: Diagnosis not present

## 2020-09-23 DIAGNOSIS — N186 End stage renal disease: Secondary | ICD-10-CM | POA: Diagnosis not present

## 2020-09-23 DIAGNOSIS — N2581 Secondary hyperparathyroidism of renal origin: Secondary | ICD-10-CM | POA: Diagnosis not present

## 2020-09-23 DIAGNOSIS — Z992 Dependence on renal dialysis: Secondary | ICD-10-CM | POA: Diagnosis not present

## 2020-09-25 DIAGNOSIS — Z992 Dependence on renal dialysis: Secondary | ICD-10-CM | POA: Diagnosis not present

## 2020-09-25 DIAGNOSIS — E876 Hypokalemia: Secondary | ICD-10-CM | POA: Diagnosis not present

## 2020-09-25 DIAGNOSIS — E1122 Type 2 diabetes mellitus with diabetic chronic kidney disease: Secondary | ICD-10-CM | POA: Diagnosis not present

## 2020-09-25 DIAGNOSIS — D631 Anemia in chronic kidney disease: Secondary | ICD-10-CM | POA: Diagnosis not present

## 2020-09-25 DIAGNOSIS — N2581 Secondary hyperparathyroidism of renal origin: Secondary | ICD-10-CM | POA: Diagnosis not present

## 2020-09-25 DIAGNOSIS — N186 End stage renal disease: Secondary | ICD-10-CM | POA: Diagnosis not present

## 2020-09-28 DIAGNOSIS — N186 End stage renal disease: Secondary | ICD-10-CM | POA: Diagnosis not present

## 2020-09-28 DIAGNOSIS — E876 Hypokalemia: Secondary | ICD-10-CM | POA: Diagnosis not present

## 2020-09-28 DIAGNOSIS — E1122 Type 2 diabetes mellitus with diabetic chronic kidney disease: Secondary | ICD-10-CM | POA: Diagnosis not present

## 2020-09-28 DIAGNOSIS — Z992 Dependence on renal dialysis: Secondary | ICD-10-CM | POA: Diagnosis not present

## 2020-09-28 DIAGNOSIS — N2581 Secondary hyperparathyroidism of renal origin: Secondary | ICD-10-CM | POA: Diagnosis not present

## 2020-09-28 DIAGNOSIS — D631 Anemia in chronic kidney disease: Secondary | ICD-10-CM | POA: Diagnosis not present

## 2020-09-29 ENCOUNTER — Ambulatory Visit (INDEPENDENT_AMBULATORY_CARE_PROVIDER_SITE_OTHER): Payer: Medicare Other | Admitting: Family Medicine

## 2020-09-29 VITALS — BP 116/53 | HR 60

## 2020-09-29 DIAGNOSIS — I4891 Unspecified atrial fibrillation: Secondary | ICD-10-CM

## 2020-09-29 LAB — POCT INR: INR: 2 (ref 2.0–3.0)

## 2020-09-29 NOTE — Progress Notes (Signed)
Gail advised

## 2020-09-29 NOTE — Progress Notes (Signed)
INR. No change. Recheck in 2 weeeks.

## 2020-09-30 DIAGNOSIS — D631 Anemia in chronic kidney disease: Secondary | ICD-10-CM | POA: Diagnosis not present

## 2020-09-30 DIAGNOSIS — N186 End stage renal disease: Secondary | ICD-10-CM | POA: Diagnosis not present

## 2020-09-30 DIAGNOSIS — E1122 Type 2 diabetes mellitus with diabetic chronic kidney disease: Secondary | ICD-10-CM | POA: Diagnosis not present

## 2020-09-30 DIAGNOSIS — Z992 Dependence on renal dialysis: Secondary | ICD-10-CM | POA: Diagnosis not present

## 2020-09-30 DIAGNOSIS — E876 Hypokalemia: Secondary | ICD-10-CM | POA: Diagnosis not present

## 2020-09-30 DIAGNOSIS — N2581 Secondary hyperparathyroidism of renal origin: Secondary | ICD-10-CM | POA: Diagnosis not present

## 2020-10-02 ENCOUNTER — Other Ambulatory Visit: Payer: Self-pay | Admitting: Cardiology

## 2020-10-02 DIAGNOSIS — E876 Hypokalemia: Secondary | ICD-10-CM | POA: Diagnosis not present

## 2020-10-02 DIAGNOSIS — N2581 Secondary hyperparathyroidism of renal origin: Secondary | ICD-10-CM | POA: Diagnosis not present

## 2020-10-02 DIAGNOSIS — D631 Anemia in chronic kidney disease: Secondary | ICD-10-CM | POA: Diagnosis not present

## 2020-10-02 DIAGNOSIS — N186 End stage renal disease: Secondary | ICD-10-CM | POA: Diagnosis not present

## 2020-10-02 DIAGNOSIS — E1122 Type 2 diabetes mellitus with diabetic chronic kidney disease: Secondary | ICD-10-CM | POA: Diagnosis not present

## 2020-10-02 DIAGNOSIS — Z992 Dependence on renal dialysis: Secondary | ICD-10-CM | POA: Diagnosis not present

## 2020-10-05 DIAGNOSIS — N186 End stage renal disease: Secondary | ICD-10-CM | POA: Diagnosis not present

## 2020-10-05 DIAGNOSIS — E1122 Type 2 diabetes mellitus with diabetic chronic kidney disease: Secondary | ICD-10-CM | POA: Diagnosis not present

## 2020-10-05 DIAGNOSIS — Z992 Dependence on renal dialysis: Secondary | ICD-10-CM | POA: Diagnosis not present

## 2020-10-05 DIAGNOSIS — E876 Hypokalemia: Secondary | ICD-10-CM | POA: Diagnosis not present

## 2020-10-05 DIAGNOSIS — N2581 Secondary hyperparathyroidism of renal origin: Secondary | ICD-10-CM | POA: Diagnosis not present

## 2020-10-05 DIAGNOSIS — D631 Anemia in chronic kidney disease: Secondary | ICD-10-CM | POA: Diagnosis not present

## 2020-10-07 DIAGNOSIS — N186 End stage renal disease: Secondary | ICD-10-CM | POA: Diagnosis not present

## 2020-10-07 DIAGNOSIS — Z794 Long term (current) use of insulin: Secondary | ICD-10-CM | POA: Diagnosis not present

## 2020-10-07 DIAGNOSIS — E876 Hypokalemia: Secondary | ICD-10-CM | POA: Diagnosis not present

## 2020-10-07 DIAGNOSIS — N2581 Secondary hyperparathyroidism of renal origin: Secondary | ICD-10-CM | POA: Diagnosis not present

## 2020-10-07 DIAGNOSIS — D631 Anemia in chronic kidney disease: Secondary | ICD-10-CM | POA: Diagnosis not present

## 2020-10-07 DIAGNOSIS — E1122 Type 2 diabetes mellitus with diabetic chronic kidney disease: Secondary | ICD-10-CM | POA: Diagnosis not present

## 2020-10-07 DIAGNOSIS — Z992 Dependence on renal dialysis: Secondary | ICD-10-CM | POA: Diagnosis not present

## 2020-10-07 DIAGNOSIS — E113213 Type 2 diabetes mellitus with mild nonproliferative diabetic retinopathy with macular edema, bilateral: Secondary | ICD-10-CM | POA: Diagnosis not present

## 2020-10-08 ENCOUNTER — Ambulatory Visit (HOSPITAL_COMMUNITY)
Admission: RE | Admit: 2020-10-08 | Discharge: 2020-10-08 | Disposition: A | Discharge status: Home / Self Care | Payer: Medicare Other | Source: Ambulatory Visit | Attending: Physician Assistant | Admitting: Physician Assistant

## 2020-10-08 ENCOUNTER — Other Ambulatory Visit: Payer: Self-pay

## 2020-10-08 ENCOUNTER — Ambulatory Visit (INDEPENDENT_AMBULATORY_CARE_PROVIDER_SITE_OTHER): Payer: Self-pay | Admitting: Physician Assistant

## 2020-10-08 ENCOUNTER — Encounter: Payer: Self-pay | Admitting: Physician Assistant

## 2020-10-08 VITALS — BP 114/63 | HR 72 | Temp 97.6°F | Resp 20 | Ht 71.0 in | Wt 227.3 lb

## 2020-10-08 DIAGNOSIS — N186 End stage renal disease: Secondary | ICD-10-CM

## 2020-10-08 DIAGNOSIS — Z992 Dependence on renal dialysis: Secondary | ICD-10-CM

## 2020-10-08 NOTE — Progress Notes (Signed)
    Postoperative Access Visit   History of Present Illness   Louis [name] is a 74 y.o. year old male who presents for postoperative follow-up for left brachial basilic vein fistula by Dr. Donzetta Matters on 09/01/20.  The patient's wounds are healed.  The patient notes no steal symptoms.  He is currently dialyzing via a right IJ TDC on Tues/ Thurs/ Saturdays at the [facility] location  Physical Examination   Vitals:   10/08/20 1026  Weight: 227 lb 4.8 oz (103.1 kg)  Height: 5\' 11"  (1.803 m)   Body mass index is 31.7 kg/m.  left arm  Incision is well healed, 2+radial pulse, hand grip is 5/5, sensation in digits is intact, palpable thrill, bruit can be auscultated. The fistula is too deep in the left upper arm for access  Non invasive vascular lab study: Findings:  +--------------------+----------+-----------------+--------+  AVF         PSV (cm/s)Flow Vol (mL/min)Comments  +--------------------+----------+-----------------+--------+  Native artery inflow  243     1365          +--------------------+----------+-----------------+--------+  AVF Anastomosis     498                 +--------------------+----------+-----------------+--------+   +------------+----------+-------------+----------+--------+  OUTFLOW VEINPSV (cm/s)Diameter (cm)Depth (cm)Describe  +------------+----------+-------------+----------+--------+  Prox UA     91    0.81     2.44        +------------+----------+-------------+----------+--------+  Mid UA     126    0.78     0.75        +------------+----------+-------------+----------+--------+  Dist UA     211    0.61     0.45        +------------+----------+-------------+----------+--------+  AC Fossa    272    0.62     0.41        +------------+----------+-------------+----------+--------+   Medical Decision Making    Louis [name] is a 74 y.o. year old male who presents s/p left brachial basilic vein fistula by Dr. Donzetta Matters on 09/01/20.  Doing well post op. Left AC incision is well healed. Patent is without signs or symptoms of steal syndrome. The duplex today shows good volume flow and adequate depth and diameter of the fistula. Unfortunately clinically it is too deep in the left upper arm and I think he would best benefit from a transposition. I have discussed this with the patient and his wife and they are agreeable to proceed. He currently dialyzes on Tues/ Thurs/ Sat. We will try to schedule his surgery around his dialysis schedule, but did let them know that this may need changed to accommodate the surgery. He is on coumadin which will need to be held for 3 days. He will have a left upper extremity 2nd stage Basilic Vein transposition by Dr. Jeri Lager, PA-C [facility] Office: [phone]  Clinic MD: Dr. Donzetta Matters

## 2020-10-08 NOTE — H&P (View-Only) (Signed)
    Postoperative Access Visit   History of Present Illness   Louis [name] is a 74 y.o. year old male who presents for postoperative follow-up for left brachial basilic vein fistula by Dr. Donzetta Matters on 09/01/20.  The patient's wounds are healed.  The patient notes no steal symptoms.  He is currently dialyzing via a right IJ TDC on Tues/ Thurs/ Saturdays at the [facility] location  Physical Examination   Vitals:   10/08/20 1026  Weight: 227 lb 4.8 oz (103.1 kg)  Height: 5\' 11"  (1.803 m)   Body mass index is 31.7 kg/m.  left arm  Incision is well healed, 2+radial pulse, hand grip is 5/5, sensation in digits is intact, palpable thrill, bruit can be auscultated. The fistula is too deep in the left upper arm for access  Non invasive vascular lab study: Findings:  +--------------------+----------+-----------------+--------+  AVF         PSV (cm/s)Flow Vol (mL/min)Comments  +--------------------+----------+-----------------+--------+  Native artery inflow  243     1365          +--------------------+----------+-----------------+--------+  AVF Anastomosis     498                 +--------------------+----------+-----------------+--------+   +------------+----------+-------------+----------+--------+  OUTFLOW VEINPSV (cm/s)Diameter (cm)Depth (cm)Describe  +------------+----------+-------------+----------+--------+  Prox UA     91    0.81     2.44        +------------+----------+-------------+----------+--------+  Mid UA     126    0.78     0.75        +------------+----------+-------------+----------+--------+  Dist UA     211    0.61     0.45        +------------+----------+-------------+----------+--------+  AC Fossa    272    0.62     0.41        +------------+----------+-------------+----------+--------+   Medical Decision Making    Louis [name] is a 74 y.o. year old male who presents s/p left brachial basilic vein fistula by Dr. Donzetta Matters on 09/01/20.  Doing well post op. Left AC incision is well healed. Patent is without signs or symptoms of steal syndrome. The duplex today shows good volume flow and adequate depth and diameter of the fistula. Unfortunately clinically it is too deep in the left upper arm and I think he would best benefit from a transposition. I have discussed this with the patient and his wife and they are agreeable to proceed. He currently dialyzes on Tues/ Thurs/ Sat. We will try to schedule his surgery around his dialysis schedule, but did let them know that this may need changed to accommodate the surgery. He is on coumadin which will need to be held for 3 days. He will have a left upper extremity 2nd stage Basilic Vein transposition by Dr. Jeri Lager, PA-C [facility] Office: [phone]  Clinic MD: Dr. Donzetta Matters

## 2020-10-09 DIAGNOSIS — Z992 Dependence on renal dialysis: Secondary | ICD-10-CM | POA: Diagnosis not present

## 2020-10-09 DIAGNOSIS — N186 End stage renal disease: Secondary | ICD-10-CM | POA: Diagnosis not present

## 2020-10-09 DIAGNOSIS — E1122 Type 2 diabetes mellitus with diabetic chronic kidney disease: Secondary | ICD-10-CM | POA: Diagnosis not present

## 2020-10-09 DIAGNOSIS — E876 Hypokalemia: Secondary | ICD-10-CM | POA: Diagnosis not present

## 2020-10-09 DIAGNOSIS — N2581 Secondary hyperparathyroidism of renal origin: Secondary | ICD-10-CM | POA: Diagnosis not present

## 2020-10-09 DIAGNOSIS — D631 Anemia in chronic kidney disease: Secondary | ICD-10-CM | POA: Diagnosis not present

## 2020-10-11 DIAGNOSIS — N2581 Secondary hyperparathyroidism of renal origin: Secondary | ICD-10-CM | POA: Diagnosis not present

## 2020-10-11 DIAGNOSIS — D631 Anemia in chronic kidney disease: Secondary | ICD-10-CM | POA: Diagnosis not present

## 2020-10-11 DIAGNOSIS — E876 Hypokalemia: Secondary | ICD-10-CM | POA: Diagnosis not present

## 2020-10-11 DIAGNOSIS — Z992 Dependence on renal dialysis: Secondary | ICD-10-CM | POA: Diagnosis not present

## 2020-10-11 DIAGNOSIS — E1122 Type 2 diabetes mellitus with diabetic chronic kidney disease: Secondary | ICD-10-CM | POA: Diagnosis not present

## 2020-10-11 DIAGNOSIS — N186 End stage renal disease: Secondary | ICD-10-CM | POA: Diagnosis not present

## 2020-10-13 ENCOUNTER — Ambulatory Visit: Payer: Medicare Other

## 2020-10-13 DIAGNOSIS — N186 End stage renal disease: Secondary | ICD-10-CM | POA: Diagnosis not present

## 2020-10-13 DIAGNOSIS — E876 Hypokalemia: Secondary | ICD-10-CM | POA: Diagnosis not present

## 2020-10-13 DIAGNOSIS — D631 Anemia in chronic kidney disease: Secondary | ICD-10-CM | POA: Diagnosis not present

## 2020-10-13 DIAGNOSIS — E1122 Type 2 diabetes mellitus with diabetic chronic kidney disease: Secondary | ICD-10-CM | POA: Diagnosis not present

## 2020-10-13 DIAGNOSIS — Z992 Dependence on renal dialysis: Secondary | ICD-10-CM | POA: Diagnosis not present

## 2020-10-13 DIAGNOSIS — N2581 Secondary hyperparathyroidism of renal origin: Secondary | ICD-10-CM | POA: Diagnosis not present

## 2020-10-16 DIAGNOSIS — E876 Hypokalemia: Secondary | ICD-10-CM | POA: Diagnosis not present

## 2020-10-16 DIAGNOSIS — D631 Anemia in chronic kidney disease: Secondary | ICD-10-CM | POA: Diagnosis not present

## 2020-10-16 DIAGNOSIS — E1122 Type 2 diabetes mellitus with diabetic chronic kidney disease: Secondary | ICD-10-CM | POA: Diagnosis not present

## 2020-10-16 DIAGNOSIS — Z992 Dependence on renal dialysis: Secondary | ICD-10-CM | POA: Diagnosis not present

## 2020-10-16 DIAGNOSIS — N186 End stage renal disease: Secondary | ICD-10-CM | POA: Diagnosis not present

## 2020-10-16 DIAGNOSIS — N2581 Secondary hyperparathyroidism of renal origin: Secondary | ICD-10-CM | POA: Diagnosis not present

## 2020-10-18 ENCOUNTER — Ambulatory Visit (INDEPENDENT_AMBULATORY_CARE_PROVIDER_SITE_OTHER): Payer: Medicare Other | Admitting: Family Medicine

## 2020-10-18 VITALS — BP 134/51 | HR 70

## 2020-10-18 DIAGNOSIS — I4891 Unspecified atrial fibrillation: Secondary | ICD-10-CM | POA: Diagnosis not present

## 2020-10-18 LAB — POCT INR: INR: 2.1 (ref 2.0–3.0)

## 2020-10-18 NOTE — Progress Notes (Signed)
[name] advised of recommendations.

## 2020-10-19 DIAGNOSIS — N186 End stage renal disease: Secondary | ICD-10-CM | POA: Diagnosis not present

## 2020-10-19 DIAGNOSIS — D631 Anemia in chronic kidney disease: Secondary | ICD-10-CM | POA: Diagnosis not present

## 2020-10-19 DIAGNOSIS — N2581 Secondary hyperparathyroidism of renal origin: Secondary | ICD-10-CM | POA: Diagnosis not present

## 2020-10-19 DIAGNOSIS — E1122 Type 2 diabetes mellitus with diabetic chronic kidney disease: Secondary | ICD-10-CM | POA: Diagnosis not present

## 2020-10-19 DIAGNOSIS — Z992 Dependence on renal dialysis: Secondary | ICD-10-CM | POA: Diagnosis not present

## 2020-10-19 DIAGNOSIS — E876 Hypokalemia: Secondary | ICD-10-CM | POA: Diagnosis not present

## 2020-10-20 DIAGNOSIS — E1122 Type 2 diabetes mellitus with diabetic chronic kidney disease: Secondary | ICD-10-CM | POA: Diagnosis not present

## 2020-10-20 DIAGNOSIS — N186 End stage renal disease: Secondary | ICD-10-CM | POA: Diagnosis not present

## 2020-10-20 DIAGNOSIS — Z992 Dependence on renal dialysis: Secondary | ICD-10-CM | POA: Diagnosis not present

## 2020-10-21 DIAGNOSIS — D509 Iron deficiency anemia, unspecified: Secondary | ICD-10-CM | POA: Diagnosis not present

## 2020-10-21 DIAGNOSIS — D631 Anemia in chronic kidney disease: Secondary | ICD-10-CM | POA: Diagnosis not present

## 2020-10-21 DIAGNOSIS — Z992 Dependence on renal dialysis: Secondary | ICD-10-CM | POA: Diagnosis not present

## 2020-10-21 DIAGNOSIS — E876 Hypokalemia: Secondary | ICD-10-CM | POA: Diagnosis not present

## 2020-10-21 DIAGNOSIS — E1122 Type 2 diabetes mellitus with diabetic chronic kidney disease: Secondary | ICD-10-CM | POA: Diagnosis not present

## 2020-10-21 DIAGNOSIS — N2581 Secondary hyperparathyroidism of renal origin: Secondary | ICD-10-CM | POA: Diagnosis not present

## 2020-10-21 DIAGNOSIS — Z23 Encounter for immunization: Secondary | ICD-10-CM | POA: Diagnosis not present

## 2020-10-21 DIAGNOSIS — N186 End stage renal disease: Secondary | ICD-10-CM | POA: Diagnosis not present

## 2020-10-23 DIAGNOSIS — Z79899 Other long term (current) drug therapy: Secondary | ICD-10-CM | POA: Diagnosis not present

## 2020-10-23 DIAGNOSIS — Z885 Allergy status to narcotic agent status: Secondary | ICD-10-CM | POA: Diagnosis not present

## 2020-10-23 DIAGNOSIS — K449 Diaphragmatic hernia without obstruction or gangrene: Secondary | ICD-10-CM | POA: Diagnosis not present

## 2020-10-23 DIAGNOSIS — N2581 Secondary hyperparathyroidism of renal origin: Secondary | ICD-10-CM | POA: Diagnosis not present

## 2020-10-23 DIAGNOSIS — I110 Hypertensive heart disease with heart failure: Secondary | ICD-10-CM | POA: Diagnosis not present

## 2020-10-23 DIAGNOSIS — R109 Unspecified abdominal pain: Secondary | ICD-10-CM | POA: Diagnosis not present

## 2020-10-23 DIAGNOSIS — E1129 Type 2 diabetes mellitus with other diabetic kidney complication: Secondary | ICD-10-CM | POA: Diagnosis not present

## 2020-10-23 DIAGNOSIS — Z794 Long term (current) use of insulin: Secondary | ICD-10-CM | POA: Diagnosis not present

## 2020-10-23 DIAGNOSIS — N390 Urinary tract infection, site not specified: Secondary | ICD-10-CM | POA: Diagnosis not present

## 2020-10-23 DIAGNOSIS — D631 Anemia in chronic kidney disease: Secondary | ICD-10-CM | POA: Diagnosis not present

## 2020-10-23 DIAGNOSIS — E11319 Type 2 diabetes mellitus with unspecified diabetic retinopathy without macular edema: Secondary | ICD-10-CM | POA: Diagnosis not present

## 2020-10-23 DIAGNOSIS — E1142 Type 2 diabetes mellitus with diabetic polyneuropathy: Secondary | ICD-10-CM | POA: Diagnosis not present

## 2020-10-23 DIAGNOSIS — I4891 Unspecified atrial fibrillation: Secondary | ICD-10-CM | POA: Diagnosis not present

## 2020-10-23 DIAGNOSIS — Z992 Dependence on renal dialysis: Secondary | ICD-10-CM | POA: Diagnosis not present

## 2020-10-23 DIAGNOSIS — I2510 Atherosclerotic heart disease of native coronary artery without angina pectoris: Secondary | ICD-10-CM | POA: Diagnosis not present

## 2020-10-23 DIAGNOSIS — E1169 Type 2 diabetes mellitus with other specified complication: Secondary | ICD-10-CM | POA: Diagnosis not present

## 2020-10-23 DIAGNOSIS — E876 Hypokalemia: Secondary | ICD-10-CM | POA: Diagnosis not present

## 2020-10-23 DIAGNOSIS — R1032 Left lower quadrant pain: Secondary | ICD-10-CM | POA: Diagnosis not present

## 2020-10-23 DIAGNOSIS — E1122 Type 2 diabetes mellitus with diabetic chronic kidney disease: Secondary | ICD-10-CM | POA: Diagnosis not present

## 2020-10-23 DIAGNOSIS — N186 End stage renal disease: Secondary | ICD-10-CM | POA: Diagnosis not present

## 2020-10-23 DIAGNOSIS — I509 Heart failure, unspecified: Secondary | ICD-10-CM | POA: Diagnosis not present

## 2020-10-23 DIAGNOSIS — N289 Disorder of kidney and ureter, unspecified: Secondary | ICD-10-CM | POA: Diagnosis not present

## 2020-10-23 DIAGNOSIS — K5730 Diverticulosis of large intestine without perforation or abscess without bleeding: Secondary | ICD-10-CM | POA: Diagnosis not present

## 2020-10-23 DIAGNOSIS — E785 Hyperlipidemia, unspecified: Secondary | ICD-10-CM | POA: Diagnosis not present

## 2020-10-25 ENCOUNTER — Ambulatory Visit (INDEPENDENT_AMBULATORY_CARE_PROVIDER_SITE_OTHER): Payer: Medicare Other | Admitting: Family Medicine

## 2020-10-25 ENCOUNTER — Telehealth: Payer: Self-pay

## 2020-10-25 ENCOUNTER — Ambulatory Visit: Payer: Medicare Other

## 2020-10-25 ENCOUNTER — Other Ambulatory Visit (HOSPITAL_COMMUNITY)
Admission: RE | Admit: 2020-10-25 | Discharge: 2020-10-25 | Disposition: A | Discharge status: Home / Self Care | Payer: Medicare Other | Source: Ambulatory Visit | Attending: Vascular Surgery | Admitting: Vascular Surgery

## 2020-10-25 VITALS — BP 124/49 | HR 64

## 2020-10-25 DIAGNOSIS — Z20822 Contact with and (suspected) exposure to covid-19: Secondary | ICD-10-CM | POA: Diagnosis not present

## 2020-10-25 DIAGNOSIS — I4891 Unspecified atrial fibrillation: Secondary | ICD-10-CM

## 2020-10-25 DIAGNOSIS — Z01812 Encounter for preprocedural laboratory examination: Secondary | ICD-10-CM | POA: Insufficient documentation

## 2020-10-25 LAB — SARS CORONAVIRUS 2 (TAT 6-24 HRS): SARS Coronavirus 2: NEGATIVE

## 2020-10-25 LAB — POCT INR: INR: 2.5 (ref 2.0–3.0)

## 2020-10-25 NOTE — Telephone Encounter (Signed)
[name] called to report that pt forgot to stop coumadin and took a dose last night. His INR was 2.5 today. Advised pt will verify with Dr. Donzetta Matters if okay to proceed with surgery on 10/27/20.

## 2020-10-26 ENCOUNTER — Encounter (HOSPITAL_COMMUNITY): Payer: Self-pay | Admitting: Vascular Surgery

## 2020-10-26 DIAGNOSIS — N2581 Secondary hyperparathyroidism of renal origin: Secondary | ICD-10-CM | POA: Diagnosis not present

## 2020-10-26 DIAGNOSIS — E876 Hypokalemia: Secondary | ICD-10-CM | POA: Diagnosis not present

## 2020-10-26 DIAGNOSIS — Z992 Dependence on renal dialysis: Secondary | ICD-10-CM | POA: Diagnosis not present

## 2020-10-26 DIAGNOSIS — E1122 Type 2 diabetes mellitus with diabetic chronic kidney disease: Secondary | ICD-10-CM | POA: Diagnosis not present

## 2020-10-26 DIAGNOSIS — N186 End stage renal disease: Secondary | ICD-10-CM | POA: Diagnosis not present

## 2020-10-26 DIAGNOSIS — D631 Anemia in chronic kidney disease: Secondary | ICD-10-CM | POA: Diagnosis not present

## 2020-10-26 NOTE — Addendum Note (Signed)
Addended by: Nicholas Lose on: 10/26/2020 10:29 AM   Modules accepted: Orders

## 2020-10-26 NOTE — Progress Notes (Signed)
Spoke with pt's wife, [name] for pre-op call. DPR on file. Pt is at dialysis today. She states pt has not had any recent complaints of chest pain or sob. Pt has hx of A-fib and is on Coumadin. Last dose was supposed to be Saturday evening. She states she forgot and pt had that dose. Last dose was Sunday, 10/24/20 pm dose. INR was checked yesterday and it was 2.5. She states she called and spoke with Dr. Claretha Cooper nurse and states that she was told that if pt's INR was less than 2 when he comes in the morning, surgery will proceed.   Pt is a type 2 diabetic. Last A1C was 6.5 on 09/09/20. [name] states his fasting blood sugar was 88 this AM. Instructed her to have pt take 1/2 of his regular dose of Novolin N tonight and in the AM. Instructed her to have pt check his blood sugar in the AM when he up. If blood sugar is >220 take 1/2 of usual correction dose of Novolin R insulin. If blood sugar is 70 or below, treat with 1/2 cup of clear juice (apple or cranberry) and recheck blood sugar 15 minutes after drinking juice. Have pt let nurse know on arrival if he had to drink the juice. She voiced understanding.  Covid test done 10/25/20 and it's negative. She states he's been in quarantine since the test was done except for going to Dialysis today and understands that he stays in quarantine until he comes to the hospital tomorrow.

## 2020-10-26 NOTE — Telephone Encounter (Signed)
Advised pt/wife per Dr. Donzetta Matters, if INR is less than 2 tomorrow we will proceed but no way to know until he is here. If he would rather we can reschedule. Pt opted to proceed with surgery date as scheduled and have INR checked on arrival.

## 2020-10-27 ENCOUNTER — Ambulatory Visit (HOSPITAL_COMMUNITY): Payer: Medicare Other | Admitting: Certified Registered"

## 2020-10-27 ENCOUNTER — Encounter (HOSPITAL_COMMUNITY): Payer: Self-pay | Admitting: Vascular Surgery

## 2020-10-27 ENCOUNTER — Ambulatory Visit (HOSPITAL_COMMUNITY)
Admission: RE | Admit: 2020-10-27 | Discharge: 2020-10-27 | Disposition: A | Discharge status: Home / Self Care | Payer: Medicare Other | Attending: Vascular Surgery | Admitting: Vascular Surgery

## 2020-10-27 ENCOUNTER — Encounter (HOSPITAL_COMMUNITY)
Admission: RE | Disposition: A | Discharge status: Home / Self Care | Payer: Self-pay | Source: Home / Self Care | Attending: Vascular Surgery

## 2020-10-27 ENCOUNTER — Other Ambulatory Visit: Payer: Self-pay

## 2020-10-27 DIAGNOSIS — N186 End stage renal disease: Secondary | ICD-10-CM | POA: Diagnosis not present

## 2020-10-27 DIAGNOSIS — Z992 Dependence on renal dialysis: Secondary | ICD-10-CM | POA: Diagnosis not present

## 2020-10-27 DIAGNOSIS — E1122 Type 2 diabetes mellitus with diabetic chronic kidney disease: Secondary | ICD-10-CM | POA: Diagnosis not present

## 2020-10-27 DIAGNOSIS — T82898A Other specified complication of vascular prosthetic devices, implants and grafts, initial encounter: Secondary | ICD-10-CM | POA: Diagnosis not present

## 2020-10-27 DIAGNOSIS — I132 Hypertensive heart and chronic kidney disease with heart failure and with stage 5 chronic kidney disease, or end stage renal disease: Secondary | ICD-10-CM | POA: Diagnosis not present

## 2020-10-27 DIAGNOSIS — I5031 Acute diastolic (congestive) heart failure: Secondary | ICD-10-CM | POA: Diagnosis not present

## 2020-10-27 HISTORY — PX: BASCILIC VEIN TRANSPOSITION: SHX5742

## 2020-10-27 HISTORY — DX: Personal history of urinary calculi: Z87.442

## 2020-10-27 LAB — POCT I-STAT, CHEM 8
BUN: 43 mg/dL — ABNORMAL HIGH (ref 8–23)
Calcium, Ion: 1.03 mmol/L — ABNORMAL LOW (ref 1.15–1.40)
Chloride: 96 mmol/L — ABNORMAL LOW (ref 98–111)
Creatinine, Ser: 3.9 mg/dL — ABNORMAL HIGH (ref 0.61–1.24)
Glucose, Bld: 184 mg/dL — ABNORMAL HIGH (ref 70–99)
HCT: 38 % — ABNORMAL LOW (ref 39.0–52.0)
Hemoglobin: 12.9 g/dL — ABNORMAL LOW (ref 13.0–17.0)
Potassium: 4.1 mmol/L (ref 3.5–5.1)
Sodium: 135 mmol/L (ref 135–145)
TCO2: 29 mmol/L (ref 22–32)

## 2020-10-27 LAB — GLUCOSE, CAPILLARY
Glucose-Capillary: 173 mg/dL — ABNORMAL HIGH (ref 70–99)
Glucose-Capillary: 174 mg/dL — ABNORMAL HIGH (ref 70–99)

## 2020-10-27 LAB — PROTIME-INR
INR: 1.5 — ABNORMAL HIGH (ref 0.8–1.2)
Prothrombin Time: 17.7 seconds — ABNORMAL HIGH (ref 11.4–15.2)

## 2020-10-27 SURGERY — TRANSPOSITION, VEIN, BASILIC
Anesthesia: General | Site: Arm Upper | Laterality: Left

## 2020-10-27 MED ORDER — SODIUM CHLORIDE 0.9 % IV SOLN
INTRAVENOUS | Status: AC
  Filled 2020-10-27: qty 1.2

## 2020-10-27 MED ORDER — PHENYLEPHRINE 40 MCG/ML (10ML) SYRINGE FOR IV PUSH (FOR BLOOD PRESSURE SUPPORT)
PREFILLED_SYRINGE | INTRAVENOUS | Status: AC
  Filled 2020-10-27: qty 10

## 2020-10-27 MED ORDER — DEXAMETHASONE SODIUM PHOSPHATE 10 MG/ML IJ SOLN
INTRAMUSCULAR | Status: DC | PRN
  Administered 2020-10-27: 10 mg via INTRAVENOUS

## 2020-10-27 MED ORDER — SODIUM CHLORIDE 0.9 % IV SOLN: INTRAVENOUS | Status: DC | PRN

## 2020-10-27 MED ORDER — CHLORHEXIDINE GLUCONATE 4 % EX LIQD: 60.0000 mL | Freq: Once | CUTANEOUS | Status: DC

## 2020-10-27 MED ORDER — HEPARIN SODIUM (PORCINE) 1000 UNIT/ML IJ SOLN
2000.0000 [IU] | Freq: Once | INTRAMUSCULAR | Status: AC
  Administered 2020-10-27: 1600 [IU] via INTRAVENOUS

## 2020-10-27 MED ORDER — MIDAZOLAM HCL 2 MG/2ML IJ SOLN
INTRAMUSCULAR | Status: DC | PRN
  Administered 2020-10-27: 2 mg via INTRAVENOUS

## 2020-10-27 MED ORDER — 0.9 % SODIUM CHLORIDE (POUR BTL) OPTIME
TOPICAL | Status: DC | PRN
  Administered 2020-10-27: 1000 mL

## 2020-10-27 MED ORDER — CEFAZOLIN SODIUM-DEXTROSE 2-4 GM/100ML-% IV SOLN
2.0000 g | INTRAVENOUS | Status: AC
  Administered 2020-10-27: 2 g via INTRAVENOUS
  Filled 2020-10-27: qty 100

## 2020-10-27 MED ORDER — DEXAMETHASONE SODIUM PHOSPHATE 10 MG/ML IJ SOLN
INTRAMUSCULAR | Status: AC
  Filled 2020-10-27: qty 1

## 2020-10-27 MED ORDER — GLYCOPYRROLATE PF 0.2 MG/ML IJ SOSY
PREFILLED_SYRINGE | INTRAMUSCULAR | Status: AC
  Filled 2020-10-27: qty 1

## 2020-10-27 MED ORDER — LIDOCAINE HCL (CARDIAC) PF 100 MG/5ML IV SOSY
PREFILLED_SYRINGE | INTRAVENOUS | Status: DC | PRN
  Administered 2020-10-27: 100 mg via INTRATRACHEAL

## 2020-10-27 MED ORDER — PROPOFOL 10 MG/ML IV BOLUS
INTRAVENOUS | Status: AC
  Filled 2020-10-27: qty 20

## 2020-10-27 MED ORDER — TRAMADOL HCL 50 MG PO TABS
50.0000 mg | ORAL_TABLET | Freq: Four times a day (QID) | ORAL | 0 refills | Status: DC | PRN
Start: 2020-10-27 — End: 2020-12-29

## 2020-10-27 MED ORDER — ONDANSETRON HCL 4 MG/2ML IJ SOLN
INTRAMUSCULAR | Status: AC
  Filled 2020-10-27: qty 2

## 2020-10-27 MED ORDER — FENTANYL CITRATE (PF) 100 MCG/2ML IJ SOLN
INTRAMUSCULAR | Status: AC
  Filled 2020-10-27: qty 2

## 2020-10-27 MED ORDER — LIDOCAINE HCL (PF) 2 % IJ SOLN
INTRAMUSCULAR | Status: AC
  Filled 2020-10-27: qty 5

## 2020-10-27 MED ORDER — SODIUM CHLORIDE 0.9 % IV SOLN
INTRAVENOUS | Status: DC | PRN
  Administered 2020-10-27: 500 mL

## 2020-10-27 MED ORDER — GLYCOPYRROLATE 0.2 MG/ML IJ SOLN
INTRAMUSCULAR | Status: DC | PRN
  Administered 2020-10-27: .2 mg via INTRAVENOUS

## 2020-10-27 MED ORDER — PHENYLEPHRINE HCL (PRESSORS) 10 MG/ML IV SOLN
INTRAVENOUS | Status: DC | PRN
  Administered 2020-10-27: 80 ug via INTRAVENOUS

## 2020-10-27 MED ORDER — FENTANYL CITRATE (PF) 100 MCG/2ML IJ SOLN
25.0000 ug | INTRAMUSCULAR | Status: DC | PRN
  Administered 2020-10-27: 25 ug via INTRAVENOUS

## 2020-10-27 MED ORDER — CHLORHEXIDINE GLUCONATE 0.12 % MT SOLN
OROMUCOSAL | Status: AC
  Administered 2020-10-27: 15 mL
  Filled 2020-10-27: qty 15

## 2020-10-27 MED ORDER — ACETAMINOPHEN 500 MG PO TABS
1000.0000 mg | ORAL_TABLET | Freq: Once | ORAL | Status: AC
  Administered 2020-10-27: 1000 mg via ORAL
  Filled 2020-10-27: qty 2

## 2020-10-27 MED ORDER — MIDAZOLAM HCL 2 MG/2ML IJ SOLN
INTRAMUSCULAR | Status: AC
  Filled 2020-10-27: qty 2

## 2020-10-27 MED ORDER — SODIUM CHLORIDE 0.9 % IV SOLN: INTRAVENOUS | Status: DC

## 2020-10-27 MED ORDER — PROPOFOL 10 MG/ML IV BOLUS
INTRAVENOUS | Status: DC | PRN
  Administered 2020-10-27: 150 mg via INTRAVENOUS

## 2020-10-27 MED ORDER — AMISULPRIDE (ANTIEMETIC) 5 MG/2ML IV SOLN: 10.0000 mg | Freq: Once | INTRAVENOUS | Status: DC | PRN

## 2020-10-27 MED ORDER — LIDOCAINE-EPINEPHRINE (PF) 1 %-1:200000 IJ SOLN
INTRAMUSCULAR | Status: AC
  Filled 2020-10-27: qty 30

## 2020-10-27 MED ORDER — FENTANYL CITRATE (PF) 250 MCG/5ML IJ SOLN
INTRAMUSCULAR | Status: AC
  Filled 2020-10-27: qty 5

## 2020-10-27 SURGICAL SUPPLY — 40 items
ADH SKN CLS APL DERMABOND .7 (GAUZE/BANDAGES/DRESSINGS) ×2
ARMBAND PINK RESTRICT EXTREMIT (MISCELLANEOUS) ×2 IMPLANT
CANISTER SUCT 3000ML PPV (MISCELLANEOUS) ×2 IMPLANT
CLIP VESOCCLUDE MED 24/CT (CLIP) ×1 IMPLANT
CLIP VESOCCLUDE MED 6/CT (CLIP) IMPLANT
CLIP VESOCCLUDE SM WIDE 24/CT (CLIP) ×1 IMPLANT
CLIP VESOCCLUDE SM WIDE 6/CT (CLIP) IMPLANT
COVER PROBE W GEL 5X96 (DRAPES) ×2 IMPLANT
DERMABOND ADVANCED (GAUZE/BANDAGES/DRESSINGS) ×2
DERMABOND ADVANCED .7 DNX12 (GAUZE/BANDAGES/DRESSINGS) ×1 IMPLANT
DRSG TEGADERM 2-3/8X2-3/4 SM (GAUZE/BANDAGES/DRESSINGS) ×1 IMPLANT
ELECT CAUTERY BLADE 6.4 (BLADE) ×1 IMPLANT
ELECT REM PT RETURN 9FT ADLT (ELECTROSURGICAL) ×2
ELECTRODE REM PT RTRN 9FT ADLT (ELECTROSURGICAL) ×1 IMPLANT
GAUZE SPONGE 2X2 8PLY STRL LF (GAUZE/BANDAGES/DRESSINGS) IMPLANT
GLOVE BIO SURGEON STRL SZ 6.5 (GLOVE) ×4 IMPLANT
GLOVE BIO SURGEON STRL SZ7.5 (GLOVE) ×2 IMPLANT
GLOVE BIOGEL PI IND STRL 7.5 (GLOVE) IMPLANT
GLOVE BIOGEL PI INDICATOR 7.5 (GLOVE) ×1
GLOVE SURG SS PI 7.5 STRL IVOR (GLOVE) ×1 IMPLANT
GLOVE SURG UNDER POLY LF SZ6 (GLOVE) ×1 IMPLANT
GLOVE SURG UNDER POLY LF SZ6.5 (GLOVE) ×1 IMPLANT
GOWN STRL REUS W/ TWL LRG LVL3 (GOWN DISPOSABLE) ×2 IMPLANT
GOWN STRL REUS W/ TWL XL LVL3 (GOWN DISPOSABLE) ×1 IMPLANT
GOWN STRL REUS W/TWL LRG LVL3 (GOWN DISPOSABLE) ×6
GOWN STRL REUS W/TWL XL LVL3 (GOWN DISPOSABLE) ×2
KIT BASIN OR (CUSTOM PROCEDURE TRAY) ×2 IMPLANT
KIT TURNOVER KIT B (KITS) ×2 IMPLANT
NS IRRIG 1000ML POUR BTL (IV SOLUTION) ×2 IMPLANT
PACK CV ACCESS (CUSTOM PROCEDURE TRAY) ×2 IMPLANT
PAD ARMBOARD 7.5X6 YLW CONV (MISCELLANEOUS) ×4 IMPLANT
SPONGE GAUZE 2X2 STER 10/PKG (GAUZE/BANDAGES/DRESSINGS) ×1
SUT MNCRL AB 4-0 PS2 18 (SUTURE) ×3 IMPLANT
SUT PROLENE 6 0 BV (SUTURE) ×3 IMPLANT
SUT SILK 2 0 SH (SUTURE) ×1 IMPLANT
SUT VIC AB 3-0 SH 27 (SUTURE) ×4
SUT VIC AB 3-0 SH 27X BRD (SUTURE) ×1 IMPLANT
TOWEL GREEN STERILE (TOWEL DISPOSABLE) ×2 IMPLANT
UNDERPAD 30X36 HEAVY ABSORB (UNDERPADS AND DIAPERS) ×2 IMPLANT
WATER STERILE IRR 1000ML POUR (IV SOLUTION) ×2 IMPLANT

## 2020-10-27 NOTE — Discharge Instructions (Signed)
° °  [facility]  Discharge Instructions  AV Fistula or Graft Surgery for Dialysis Access  Please refer to the following instructions for your post-procedure care. Your surgeon or physician assistant will discuss any changes with you.  Activity  You may drive the day following your surgery, if you are comfortable and no longer taking prescription pain medication. Resume full activity as the soreness in your incision resolves.  Bathing/Showering  You may shower after you go home. Keep your incision dry for 48 hours. Do not soak in a bathtub, hot tub, or swim until the incision heals completely. You may not shower if you have a hemodialysis catheter.  Incision Care  Clean your incision with mild soap and water after 48 hours. Pat the area dry with a clean towel. You do not need a bandage unless otherwise instructed. Do not apply any ointments or creams to your incision. You may have skin glue on your incision. Do not peel it off. It will come off on its own in about one week. Your arm may swell a bit after surgery. To reduce swelling use pillows to elevate your arm so it is above your heart. Your doctor will tell you if you need to lightly wrap your arm with an ACE bandage.  Diet  Resume your normal diet. There are not special food restrictions following this procedure. In order to heal from your surgery, it is CRITICAL to get adequate nutrition. Your body requires vitamins, minerals, and protein. Vegetables are the best source of vitamins and minerals. Vegetables also provide the perfect balance of protein. Processed food has little nutritional value, so try to avoid this.  Medications  Resume taking all of your medications. If your incision is causing pain, you may take over-the counter pain relievers such as acetaminophen (Tylenol). If you were prescribed a stronger pain medication, please be aware these medications can cause nausea and constipation. Prevent  nausea by taking the medication with a snack or meal. Avoid constipation by drinking plenty of fluids and eating foods with high amount of fiber, such as fruits, vegetables, and grains.  Do not take Tylenol if you are taking prescription pain medications.  Follow up Your surgeon may want to see you in the office following your access surgery. If so, this will be arranged at the time of your surgery.  Please call us immediately for any of the following conditions:   Increased pain, redness, drainage (pus) from your incision site  Fever of 101 degrees or higher  Severe or worsening pain at your incision site  Hand pain or numbness.   Reduce your risk of vascular disease:   Stop smoking. If you would like help, call [facility] at 1-800-QUIT-NOW 631 872 9092) or [place] at [place] your cholesterol  Maintain a desired weight  Control your diabetes  Keep your blood pressure down  Dialysis  It will take several weeks to several months for your new dialysis access to be ready for use. Your surgeon will determine when it is okay to use it. Your nephrologist will continue to direct your dialysis. You can continue to use your Permcath until your new access is ready for use.   10/27/2020 Louis [name] [id number] 16-May-1946  Surgeon(s): Waynetta Sandy, MD  Procedure(s): LEFT SECOND STAGE BASILIC VEIN FISTULA TRANSPOSITION  x Do not stick fistula for 6 weeks    If you have any questions, please call the office at [phone].

## 2020-10-27 NOTE — Transfer of Care (Signed)
Immediate Anesthesia Transfer of Care Note  Patient: Louis [name]  Procedure(s) Performed: LEFT SECOND STAGE BASILIC VEIN FISTULA TRANSPOSITION (Left Arm Upper)  Patient Location: PACU  Anesthesia Type:General  Level of Consciousness: oriented, drowsy, patient cooperative and responds to stimulation  Airway & Oxygen Therapy: Patient Spontanous Breathing and Patient connected to nasal cannula oxygen  Post-op Assessment: Report given to RN, Post -op Vital signs reviewed and stable and Patient moving all extremities X 4  Post vital signs: Reviewed and stable  Last Vitals:  Vitals Value Taken Time  BP    Temp    Pulse    Resp    SpO2      Last Pain:  Vitals:   10/27/20 0749  TempSrc:   PainSc: 0-No pain         Complications: No complications documented.

## 2020-10-27 NOTE — Anesthesia Procedure Notes (Signed)
Procedure Name: LMA Insertion Date/Time: 10/27/2020 8:27 AM Performed by: Claris Che, CRNA Pre-anesthesia Checklist: Patient identified, Emergency Drugs available, Suction available, Patient being monitored and Timeout performed Patient Re-evaluated:Patient Re-evaluated prior to induction Oxygen Delivery Method: Circle system utilized Preoxygenation: Pre-oxygenation with 100% oxygen Induction Type: IV induction Ventilation: Mask ventilation without difficulty LMA: LMA inserted LMA Size: 4.0 Number of attempts: 1 Placement Confirmation: positive ETCO2 and breath sounds checked- equal and bilateral Tube secured with: Tape Dental Injury: Teeth and Oropharynx as per pre-operative assessment

## 2020-10-27 NOTE — Interval H&P Note (Signed)
History and Physical Interval Note:  10/27/2020 8:19 AM  Louis [name]  has presented today for surgery, with the diagnosis of ESRD.  The various methods of treatment have been discussed with the patient and family. After consideration of risks, benefits and other options for treatment, the patient has consented to  Procedure(s): LEFT SECOND STAGE [place] (Left) as a surgical intervention.  The patient's history has been reviewed, patient examined, no change in status, stable for surgery.  I have reviewed the patient's chart and labs.  Questions were answered to the patient's satisfaction.     [name]

## 2020-10-27 NOTE — Anesthesia Postprocedure Evaluation (Signed)
Anesthesia Post Note  Patient: Louis [name]  Procedure(s) Performed: LEFT SECOND STAGE BASILIC VEIN FISTULA TRANSPOSITION (Left Arm Upper)     Patient location during evaluation: PACU Anesthesia Type: General Level of consciousness: awake and alert Pain management: pain level controlled Vital Signs Assessment: post-procedure vital signs reviewed and stable Respiratory status: spontaneous breathing, nonlabored ventilation, respiratory function stable and patient connected to nasal cannula oxygen Cardiovascular status: blood pressure returned to baseline and stable Postop Assessment: no apparent nausea or vomiting Anesthetic complications: no   No complications documented.  Last Vitals:  Vitals:   10/27/20 1030 10/27/20 1045  BP: (!) 160/70 (!) 146/68  Pulse: 74 82  Resp: 16 17  Temp:    SpO2: 100% 98%    Last Pain:  Vitals:   10/27/20 1045  TempSrc:   PainSc: 3                  Tiajuana Amass

## 2020-10-27 NOTE — Op Note (Signed)
    Patient name: Louis [name] MRN: [id number] DOB: Oct 05, 1946 Sex: male  10/27/2020 Pre-operative Diagnosis: esrd Post-operative diagnosis:  Same Surgeon:  Erlene Quan C. Donzetta Matters, MD Assistant: Leontine Locket, PA Procedure Performed: Revision of left arm basilic vein fistula with transposition  Indications: 74 year old male on dialysis via catheter.  He has a first stage basilic vein fistula he is now indicated for transposition.  An assistant was necessary for him, retraction, assistance with anastomotic suturing and wound closure.  Findings: The vein was healthy throughout its course.  There was a plethora of branches that were all divided between ties.  At completion there was a very strong thrill.  There was Doppler flow in his ulnar artery at the wrist consistent with preoperative exam.   Procedure:  The patient was identified in the holding area and taken to to the operating room where is placed supine on the operative table and LMA anesthesia induced.  He was sterilely prepped and draped in the left upper extremity in the usual fashion antibiotics were administered and a timeout was called.  We began with longitudinal incision above the antecubitum.  We dissected down to the vein.  We dissected this back under the antecubital space near the anastomosis.  We then divided branches between clips and ties.  We made 2 counterincisions up to the axilla.  We protected the nerve throughout its course.  After the entirety the vein was dissected free we marked it for orientation.  We clamped near the anastomosis transected.  We flushed with heparinized saline clamped it reflushed it.  It was then tunneled maintaining orientation laterally.  We specially both inside and and with 6-0 Prolene suture.  Prior completion without flushing all directions.  Upon completion was a very strong thrill in the fistula.  We used Doppler we confirmed ulnar artery signal.  Satisfied we irrigated the wounds we obtain  hemostasis and closed in layers with Vicryl and Monocryl.  Dermabond was placed at the skin.  He was awakened from anesthesia having tolerated procedure without any complication.  EBL: 100 cc   Marrietta Thunder C. Donzetta Matters, MD [facility] [place] Office: [phone] Pager: [phone]

## 2020-10-27 NOTE — Anesthesia Preprocedure Evaluation (Signed)
Anesthesia Evaluation  Patient identified by MRN, date of birth, ID band Patient awake    Reviewed: Allergy & Precautions, NPO status , Patient's Chart, lab work & pertinent test results  Airway Mallampati: III  TM Distance: >3 FB Neck ROM: Full    Dental  (+) Dental Advisory Given   Pulmonary shortness of breath, sleep apnea , former smoker,    breath sounds clear to auscultation       Cardiovascular hypertension, Pt. on medications + CAD and +CHF  + dysrhythmias Atrial Fibrillation  Rhythm:Regular Rate:Normal     Neuro/Psych  Neuromuscular disease    GI/Hepatic negative GI ROS, Neg liver ROS,   Endo/Other  diabetes, Type 2, Insulin Dependent  Renal/GU CRFRenal disease     Musculoskeletal  (+) Arthritis ,   Abdominal   Peds  Hematology negative hematology ROS (+)   Anesthesia Other Findings   Reproductive/Obstetrics                             Lab Results  Component Value Date   WBC 11.4 09/09/2020   HGB 11.3 (A) 09/09/2020   HCT 35 (A) 09/09/2020   MCV 101 09/09/2020   PLT 234 03/08/2020   Lab Results  Component Value Date   CREATININE 3.50 (H) 09/01/2020   BUN 37 (H) 09/01/2020   NA 146 09/09/2020   K 3.3 (A) 09/09/2020   CL 97 (L) 09/01/2020   CO2 27 10/27/2019    Anesthesia Physical  Anesthesia Plan  ASA: III  Anesthesia Plan: General   Post-op Pain Management:    Induction: Intravenous  PONV Risk Score and Plan: 1 and Propofol infusion, Ondansetron and Treatment may vary due to age or medical condition  Airway Management Planned: LMA  Additional Equipment:   Intra-op Plan:   Post-operative Plan: Extubation in OR  Informed Consent: I have reviewed the patients History and Physical, chart, labs and discussed the procedure including the risks, benefits and alternatives for the proposed anesthesia with the patient or authorized representative who has  indicated his/her understanding and acceptance.     Dental advisory given  Plan Discussed with: CRNA  Anesthesia Plan Comments:         Anesthesia Quick Evaluation

## 2020-10-27 NOTE — OR Nursing (Signed)
Pt dressed sitting in bed, awaiting IV team to deaccess hemodialysis cath IV notified pt is ready for discharge.

## 2020-10-28 ENCOUNTER — Encounter (HOSPITAL_COMMUNITY): Payer: Self-pay | Admitting: Vascular Surgery

## 2020-10-28 DIAGNOSIS — E876 Hypokalemia: Secondary | ICD-10-CM | POA: Diagnosis not present

## 2020-10-28 DIAGNOSIS — N2581 Secondary hyperparathyroidism of renal origin: Secondary | ICD-10-CM | POA: Diagnosis not present

## 2020-10-28 DIAGNOSIS — Z961 Presence of intraocular lens: Secondary | ICD-10-CM | POA: Diagnosis not present

## 2020-10-28 DIAGNOSIS — E113213 Type 2 diabetes mellitus with mild nonproliferative diabetic retinopathy with macular edema, bilateral: Secondary | ICD-10-CM | POA: Diagnosis not present

## 2020-10-28 DIAGNOSIS — E1122 Type 2 diabetes mellitus with diabetic chronic kidney disease: Secondary | ICD-10-CM | POA: Diagnosis not present

## 2020-10-28 DIAGNOSIS — Z992 Dependence on renal dialysis: Secondary | ICD-10-CM | POA: Diagnosis not present

## 2020-10-28 DIAGNOSIS — N186 End stage renal disease: Secondary | ICD-10-CM | POA: Diagnosis not present

## 2020-10-28 DIAGNOSIS — Z794 Long term (current) use of insulin: Secondary | ICD-10-CM | POA: Diagnosis not present

## 2020-10-28 DIAGNOSIS — D631 Anemia in chronic kidney disease: Secondary | ICD-10-CM | POA: Diagnosis not present

## 2020-10-29 ENCOUNTER — Other Ambulatory Visit: Payer: Self-pay

## 2020-10-29 MED ORDER — AMLODIPINE BESYLATE 5 MG PO TABS: 5.0000 mg | ORAL_TABLET | Freq: Every day | ORAL | 1 refills | Status: DC

## 2020-10-29 NOTE — Progress Notes (Signed)
Agree with documentation as above.   Mariachristina Holle, MD  

## 2020-10-29 NOTE — Telephone Encounter (Signed)
Charlie's prescription was changed to Amlodipine 5 mg per [name].

## 2020-10-30 DIAGNOSIS — Z992 Dependence on renal dialysis: Secondary | ICD-10-CM | POA: Diagnosis not present

## 2020-10-30 DIAGNOSIS — E876 Hypokalemia: Secondary | ICD-10-CM | POA: Diagnosis not present

## 2020-10-30 DIAGNOSIS — E1122 Type 2 diabetes mellitus with diabetic chronic kidney disease: Secondary | ICD-10-CM | POA: Diagnosis not present

## 2020-10-30 DIAGNOSIS — N2581 Secondary hyperparathyroidism of renal origin: Secondary | ICD-10-CM | POA: Diagnosis not present

## 2020-10-30 DIAGNOSIS — D631 Anemia in chronic kidney disease: Secondary | ICD-10-CM | POA: Diagnosis not present

## 2020-10-30 DIAGNOSIS — N186 End stage renal disease: Secondary | ICD-10-CM | POA: Diagnosis not present

## 2020-11-01 ENCOUNTER — Ambulatory Visit: Payer: Medicare Other | Admitting: Family Medicine

## 2020-11-02 DIAGNOSIS — N186 End stage renal disease: Secondary | ICD-10-CM | POA: Diagnosis not present

## 2020-11-02 DIAGNOSIS — N2581 Secondary hyperparathyroidism of renal origin: Secondary | ICD-10-CM | POA: Diagnosis not present

## 2020-11-02 DIAGNOSIS — D631 Anemia in chronic kidney disease: Secondary | ICD-10-CM | POA: Diagnosis not present

## 2020-11-02 DIAGNOSIS — E1122 Type 2 diabetes mellitus with diabetic chronic kidney disease: Secondary | ICD-10-CM | POA: Diagnosis not present

## 2020-11-02 DIAGNOSIS — Z992 Dependence on renal dialysis: Secondary | ICD-10-CM | POA: Diagnosis not present

## 2020-11-02 DIAGNOSIS — E876 Hypokalemia: Secondary | ICD-10-CM | POA: Diagnosis not present

## 2020-11-02 MED ORDER — ISOSORBIDE DINITRATE 20 MG PO TABS
20.0000 mg | ORAL_TABLET | Freq: Two times a day (BID) | ORAL | 3 refills | Status: DC
Start: 2020-11-02 — End: 2020-12-29

## 2020-11-03 ENCOUNTER — Ambulatory Visit (INDEPENDENT_AMBULATORY_CARE_PROVIDER_SITE_OTHER): Payer: Medicare Other | Admitting: Physician Assistant

## 2020-11-03 VITALS — BP 116/46 | HR 71

## 2020-11-03 DIAGNOSIS — I4891 Unspecified atrial fibrillation: Secondary | ICD-10-CM | POA: Diagnosis not present

## 2020-11-03 LAB — POCT INR: INR: 2.4 (ref 2.0–3.0)

## 2020-11-03 NOTE — Progress Notes (Signed)
Same dose.  Recheck in 3 weeks

## 2020-11-04 DIAGNOSIS — D631 Anemia in chronic kidney disease: Secondary | ICD-10-CM | POA: Diagnosis not present

## 2020-11-04 DIAGNOSIS — Z992 Dependence on renal dialysis: Secondary | ICD-10-CM | POA: Diagnosis not present

## 2020-11-04 DIAGNOSIS — N186 End stage renal disease: Secondary | ICD-10-CM | POA: Diagnosis not present

## 2020-11-04 DIAGNOSIS — N2581 Secondary hyperparathyroidism of renal origin: Secondary | ICD-10-CM | POA: Diagnosis not present

## 2020-11-04 DIAGNOSIS — E876 Hypokalemia: Secondary | ICD-10-CM | POA: Diagnosis not present

## 2020-11-04 DIAGNOSIS — E1122 Type 2 diabetes mellitus with diabetic chronic kidney disease: Secondary | ICD-10-CM | POA: Diagnosis not present

## 2020-11-06 DIAGNOSIS — D631 Anemia in chronic kidney disease: Secondary | ICD-10-CM | POA: Diagnosis not present

## 2020-11-06 DIAGNOSIS — N186 End stage renal disease: Secondary | ICD-10-CM | POA: Diagnosis not present

## 2020-11-06 DIAGNOSIS — N2581 Secondary hyperparathyroidism of renal origin: Secondary | ICD-10-CM | POA: Diagnosis not present

## 2020-11-06 DIAGNOSIS — E876 Hypokalemia: Secondary | ICD-10-CM | POA: Diagnosis not present

## 2020-11-06 DIAGNOSIS — Z992 Dependence on renal dialysis: Secondary | ICD-10-CM | POA: Diagnosis not present

## 2020-11-06 DIAGNOSIS — E1122 Type 2 diabetes mellitus with diabetic chronic kidney disease: Secondary | ICD-10-CM | POA: Diagnosis not present

## 2020-11-09 DIAGNOSIS — E1122 Type 2 diabetes mellitus with diabetic chronic kidney disease: Secondary | ICD-10-CM | POA: Diagnosis not present

## 2020-11-09 DIAGNOSIS — N2581 Secondary hyperparathyroidism of renal origin: Secondary | ICD-10-CM | POA: Diagnosis not present

## 2020-11-09 DIAGNOSIS — N186 End stage renal disease: Secondary | ICD-10-CM | POA: Diagnosis not present

## 2020-11-09 DIAGNOSIS — E876 Hypokalemia: Secondary | ICD-10-CM | POA: Diagnosis not present

## 2020-11-09 DIAGNOSIS — D631 Anemia in chronic kidney disease: Secondary | ICD-10-CM | POA: Diagnosis not present

## 2020-11-09 DIAGNOSIS — Z992 Dependence on renal dialysis: Secondary | ICD-10-CM | POA: Diagnosis not present

## 2020-11-11 DIAGNOSIS — N2581 Secondary hyperparathyroidism of renal origin: Secondary | ICD-10-CM | POA: Diagnosis not present

## 2020-11-11 DIAGNOSIS — E876 Hypokalemia: Secondary | ICD-10-CM | POA: Diagnosis not present

## 2020-11-11 DIAGNOSIS — N186 End stage renal disease: Secondary | ICD-10-CM | POA: Diagnosis not present

## 2020-11-11 DIAGNOSIS — E1122 Type 2 diabetes mellitus with diabetic chronic kidney disease: Secondary | ICD-10-CM | POA: Diagnosis not present

## 2020-11-11 DIAGNOSIS — D631 Anemia in chronic kidney disease: Secondary | ICD-10-CM | POA: Diagnosis not present

## 2020-11-11 DIAGNOSIS — Z992 Dependence on renal dialysis: Secondary | ICD-10-CM | POA: Diagnosis not present

## 2020-11-14 DIAGNOSIS — N186 End stage renal disease: Secondary | ICD-10-CM | POA: Diagnosis not present

## 2020-11-14 DIAGNOSIS — E1122 Type 2 diabetes mellitus with diabetic chronic kidney disease: Secondary | ICD-10-CM | POA: Diagnosis not present

## 2020-11-14 DIAGNOSIS — D631 Anemia in chronic kidney disease: Secondary | ICD-10-CM | POA: Diagnosis not present

## 2020-11-14 DIAGNOSIS — N2581 Secondary hyperparathyroidism of renal origin: Secondary | ICD-10-CM | POA: Diagnosis not present

## 2020-11-14 DIAGNOSIS — E876 Hypokalemia: Secondary | ICD-10-CM | POA: Diagnosis not present

## 2020-11-14 DIAGNOSIS — Z992 Dependence on renal dialysis: Secondary | ICD-10-CM | POA: Diagnosis not present

## 2020-11-15 ENCOUNTER — Other Ambulatory Visit: Payer: Self-pay | Admitting: Cardiology

## 2020-11-16 DIAGNOSIS — E876 Hypokalemia: Secondary | ICD-10-CM | POA: Diagnosis not present

## 2020-11-16 DIAGNOSIS — N2581 Secondary hyperparathyroidism of renal origin: Secondary | ICD-10-CM | POA: Diagnosis not present

## 2020-11-16 DIAGNOSIS — Z992 Dependence on renal dialysis: Secondary | ICD-10-CM | POA: Diagnosis not present

## 2020-11-16 DIAGNOSIS — N186 End stage renal disease: Secondary | ICD-10-CM | POA: Diagnosis not present

## 2020-11-16 DIAGNOSIS — D631 Anemia in chronic kidney disease: Secondary | ICD-10-CM | POA: Diagnosis not present

## 2020-11-16 DIAGNOSIS — E1122 Type 2 diabetes mellitus with diabetic chronic kidney disease: Secondary | ICD-10-CM | POA: Diagnosis not present

## 2020-11-17 ENCOUNTER — Encounter: Payer: Self-pay | Admitting: Family Medicine

## 2020-11-17 ENCOUNTER — Other Ambulatory Visit: Payer: Self-pay

## 2020-11-17 ENCOUNTER — Ambulatory Visit (INDEPENDENT_AMBULATORY_CARE_PROVIDER_SITE_OTHER): Payer: Medicare Other | Admitting: Family Medicine

## 2020-11-17 VITALS — BP 119/46 | HR 94 | Ht 71.0 in

## 2020-11-17 DIAGNOSIS — I2510 Atherosclerotic heart disease of native coronary artery without angina pectoris: Secondary | ICD-10-CM

## 2020-11-17 DIAGNOSIS — R2681 Unsteadiness on feet: Secondary | ICD-10-CM

## 2020-11-17 DIAGNOSIS — E11610 Type 2 diabetes mellitus with diabetic neuropathic arthropathy: Secondary | ICD-10-CM | POA: Diagnosis not present

## 2020-11-17 DIAGNOSIS — N186 End stage renal disease: Secondary | ICD-10-CM | POA: Diagnosis not present

## 2020-11-17 DIAGNOSIS — E1142 Type 2 diabetes mellitus with diabetic polyneuropathy: Secondary | ICD-10-CM

## 2020-11-17 DIAGNOSIS — I10 Essential (primary) hypertension: Secondary | ICD-10-CM

## 2020-11-17 DIAGNOSIS — I4891 Unspecified atrial fibrillation: Secondary | ICD-10-CM

## 2020-11-17 LAB — POCT INR: INR: 3.6 — AB (ref 2.0–3.0)

## 2020-11-17 MED ORDER — PREGABALIN 25 MG PO CAPS: 25.0000 mg | ORAL_CAPSULE | Freq: Every day | ORAL | 0 refills | Status: DC

## 2020-11-17 NOTE — Patient Instructions (Signed)
Okay to decrease amlodipine to half a tab daily.  Just continue to monitor blood pressures. Decrease Coumadin to half a tab 2 days a week.  She just took a half tab yesterday go ahead and take a half a tab today as well in the next week just pick 2 days such as Tuesdays and Fridays.  Plan to recheck your INR in 10 days.  Hold your amitriptyline at bedtime for 1 week to see if you feel like it is actually helpful or not.

## 2020-11-17 NOTE — Assessment & Plan Note (Signed)
Secondary to peripheral neuropathy.  Declined PT today.  He has done it in the past.

## 2020-11-17 NOTE — Assessment & Plan Note (Signed)
Fistula on left upper arm healing well.  Able to palpate a thrill on exam.

## 2020-11-17 NOTE — Assessment & Plan Note (Signed)
Diastolic pressures have been running a little low so we will have him split his amlodipine in half to take a total of 2.5 mg daily.  His wife will monitor home blood pressures.

## 2020-11-17 NOTE — Assessment & Plan Note (Signed)
The adjustment to Coumadin.  Will take half a tab 2 days a week, and whole tab the other 5 days a week and recheck in 10 days.

## 2020-11-17 NOTE — Progress Notes (Signed)
Established Patient Office Visit  Subjective:  Patient ID: Louis [name], male    DOB: [birth year]  Age: 74 y.o. MRN: [id number]  CC:  Chief Complaint  Patient presents with  . Follow-up    HPI Louis [name] presents for follow-up hypertension.  He and his wife are here today for follow-up he did bring in his blood pressures his wife is concerned because some of his diastolic pressures are in the 50s such as 53.  But some are in the low 70s which was the highest.  She is wondering if maybe they should consider cutting the amlodipine in half.  Systolics have been ranging from 116 2 at the highest 159 but most of them are in the 130s.  Diabetes-overall doing really well blood sugars have been fairly well controlled and consistent.  He has been taking his insulin regularly and did bring in his glucose log today.  Most blood sugars are in the 100s and low 200s.  He is a very brittle diabetic.  Also here for his INR check for his Coumadin.  He is currently taking a half a tab on Tuesdays and a whole tab the other 6 days a week.  Wants to discuss his peripheral neuropathy today.  Feet feel completely numb all the time he is not really having pain per se but has noticed it starting to affect his gait.  He does wear a brace on his right foot.  He says he constantly feels like his hands are cold and numb even when they do not feel cold to touch.  He is currently on amitriptyline half a tab (12.73m) at bedtime as well as gabapentin 300 mg daily.  This dosing is based off of his renal function as he is in stage renal disease on dialysis.  In fact he did have a fistula placed and has his postop follow-up in about 2 weeks.  Past Medical History:  Diagnosis Date  . Arthritis   . Brittle bones    per pt, has soft bones in right foot/wears boot cast!  . CAD (coronary artery disease)   . Cancer ([facility]    skin cancer on arm  . Cataract    Bil/ surg scheduled for right eye 01/18/17/ left eye  02/08/17  . Charcot ankle, right 2019  . CHF (congestive heart failure) ([facility] 2015  . Diabetes mellitus    Type 2  . Heart failure, diastolic ([place]   . History of kidney stones   . Hyperlipidemia   . Hypertension   . Macular degeneration disease   . Macular edema 2014  . OSA on CPAP   . Paroxysmal atrial fibrillation (HCC)   . Personal history of colonic polyps - adenomas 01/28/2014  . Renal insufficiency    Tu/Th/Sa Dialysis  . Shortness of breath dyspnea   . Syncope and collapse     Past Surgical History:  Procedure Laterality Date  . AV FISTULA PLACEMENT Left 09/01/2020   Procedure: LEFT ARM ARTERIOVENOUS (AV) FISTULA;  Surgeon: CWaynetta Sandy MD;  [facility]  Service: Vascular;  Laterality: Left;  . BASCILIC VEIN TRANSPOSITION Left 10/27/2020   Procedure: LEFT SECOND STAGE BASILIC VEIN FISTULA TRANSPOSITION;  Surgeon: CWaynetta Sandy MD;  [facility]  Service: Vascular;  Laterality: Left;  . CARPAL TUNNEL RELEASE     left hand  . COLONOSCOPY    . INTRAVASCULAR PRESSURE WIRE/FFR STUDY N/A 12/18/2018   Procedure: INTRAVASCULAR PRESSURE WIRE/FFR STUDY;  Surgeon: AFletcher Anon  Mertie Clause, MD;  Location: [facility];  Service: Cardiovascular;  Laterality: N/A;  . IR FLUORO GUIDE CV LINE RIGHT  12/16/2018  . IR US GUIDE VASC ACCESS RIGHT  12/16/2018  . KNEE ARTHROSCOPY Right 09/13/2016   [facility], Dr. Dorna Leitz  . PILONIDAL CYST EXCISION    . RIGHT/LEFT HEART CATH AND CORONARY ANGIOGRAPHY N/A 12/18/2018   Procedure: RIGHT/LEFT HEART CATH AND CORONARY ANGIOGRAPHY;  Surgeon: Wellington Hampshire, MD;  Location: [facility];  Service: Cardiovascular;  Laterality: N/A;    Family History  Problem Relation Age of Onset  . Diabetes Mother   . Kidney disease Mother   . Diabetes Father   . Coronary artery disease Father   . Diabetes Brother   . Diabetes Sister   . Prostate cancer Maternal Uncle   . Diabetes Maternal Grandmother   .  Cancer Paternal Grandmother        unknown  . Heart attack Paternal Grandfather   . Colon cancer Neg Hx     Social History   Socioeconomic History  . Marital status: Married    Spouse name: [name]  . Number of children: 5  . Years of education: 39  . Highest education level: 12th grade  Occupational History  . Occupation: Warehouse    Comment: retired  Tobacco Use  . Smoking status: Former Smoker    Packs/day: 0.50    Years: 20.00    Pack years: 10.00    Types: Cigarettes    Quit date: 03/06/1977    Years since quitting: 43.7  . Smokeless tobacco: Never Used  Vaping Use  . Vaping Use: Never used  Substance and Sexual Activity  . Alcohol use: No  . Drug use: No  . Sexual activity: Not Currently  Other Topics Concern  . Not on file  Social History Narrative   Drinks a cup of coffee daily. Drinks a lot of water daily. Goes out with church members during the week   Social Determinants of Health   Financial Resource Strain: Not on file  Food Insecurity: Not on file  Transportation Needs: Not on file  Physical Activity: Not on file  Stress: Not on file  Social Connections: Not on file  Intimate Partner Violence: Not on file    Outpatient Medications Prior to Visit  Medication Sig Dispense Refill  . allopurinol (ZYLOPRIM) 100 MG tablet TAKE 1 TO 1 & 1/2 (ONE & ONE-HALF) TABLETS BY MOUTH TWICE DAILY (Patient taking differently: Take 100 mg by mouth daily.) 270 tablet 1  . AMBULATORY NON FORMULARY MEDICATION Continuous positive airway pressure (CPAP) device: Auto titrate minimum 4 cm H20 to maximum of 20 cm H2O with pressure. Please provide all supplemental supplies as needed. Fax to: [phone] 1 Units 1  . AMBULATORY NON FORMULARY MEDICATION Shower chair - Dx: M25.561, M54.12, M10.00 - Fax to - [phone] 1 each 0  . AMBULATORY NON FORMULARY MEDICATION Rollator walker with seat. Dx: M25.561, M54.12, M10.00 - Fax to - [phone] 1 each 0  . amitriptyline (ELAVIL) 25  MG tablet TAKE 1 TO 2 TABLETS BY MOUTH AT BEDTIME (Patient taking differently: Take 12.5 mg by mouth at bedtime.) 90 tablet 1  . amLODipine (NORVASC) 5 MG tablet Take 1 tablet (5 mg total) by mouth daily. 90 tablet 1  . B-D INS SYR ULTRAFINE 1CC/31G 31G X 5/16" 1 ML MISC     . blood glucose meter kit and supplies Use to check blood sugars daily E11.65    .  cefpodoxime (VANTIN) 200 MG tablet Take 200 mg by mouth 2 (two) times daily.    . Cholecalciferol (VITAMIN D3) 50 MCG (2000 UT) TABS Take 2,000 Units by mouth daily.     . Coenzyme Q10 (COQ10) 200 MG CAPS Take 200 mg by mouth daily.    . Continuous Blood Gluc Receiver (FREESTYLE LIBRE 14 DAY READER) DEVI Dx DM E11.22 Check blood sugar 4 times daily. 1 each prn  . Continuous Blood Gluc Sensor (FREESTYLE LIBRE 14 DAY SENSOR) MISC Dx DM E11.22 Check blood sugar 4 times daily. 1 each prn  . doxercalciferol (HECTOROL) 4 MCG/2ML injection Doxercalciferol (Hectorol)    . furosemide (LASIX) 80 MG tablet Take 160 mg by mouth 2 (two) times daily. Morning & after lunch    . gabapentin (NEURONTIN) 300 MG capsule Take 1 capsule (300 mg total) by mouth daily. 90 capsule 1  . hydrALAZINE (APRESOLINE) 100 MG tablet Take 1 tablet (100 mg total) by mouth 3 (three) times daily. 270 tablet 3  . isosorbide dinitrate (ISORDIL) 20 MG tablet Take 1 tablet (20 mg total) by mouth 2 (two) times daily. 180 tablet 3  . nitroGLYCERIN (NITROSTAT) 0.4 MG SL tablet Place 1 tablet (0.4 mg total) under the tongue every 5 (five) minutes as needed for chest pain. 25 tablet 0  . NOVOLIN N 100 UNIT/ML injection Inject 20-30 Units into the skin See admin instructions. Inject 30 units subcutaneously in the morning & inject 20-30 units subcutaneously at bedtime (depending on blood sugar)    . NOVOLIN R 100 UNIT/ML injection Inject 8-20 Units into the skin 3 (three) times daily with meals. Sliding Scale  5  . omega-3 acid ethyl esters (LOVAZA) 1 g capsule Take 2 g by mouth daily.     Glory Rosebush ULTRA test strip USE 1 STRIP TO CHECK GLUCOSE 3 TIMES DAILY. E11.22 300 each 11  . Probiotic Product (PROBIOTIC DAILY PO) Take 420 mg by mouth daily.    . rosuvastatin (CRESTOR) 40 MG tablet Take 1 tablet by mouth once daily 90 tablet 3  . traMADol (ULTRAM) 50 MG tablet Take 1 tablet (50 mg total) by mouth every 6 (six) hours as needed. 20 tablet 0  . Vitamin Mixture (VITAMIN E COMPLETE PO) Take 1 capsule by mouth daily. Vitamin E complex    . warfarin (COUMADIN) 5 MG tablet TAKE 1 TABLET BY MOUTH ONCE DAILY AT  6PM  MONITOR  INR  GOAL  SHOULD  BE  BETWEEN  2-3 (Patient taking differently: Take 2.5-5 mg by mouth See admin instructions. Take 0.5 tablet (2.5 mg) by mouth on Tuesdays in the evening & take 1 tablet (5 mg) by mouth on all other day of the week in the evening. MONITOR  INR  GOAL  SHOULD  BE  BETWEEN  2-3) 90 tablet 0  . ZINC SULFATE PO Take 1 tablet by mouth daily.      No facility-administered medications prior to visit.    Allergies  Allergen Reactions  . Hydrocodone Nausea And Vomiting    Other reaction(s): GI Upset (intolerance) Projectile vomiting   . Oxycodone Nausea And Vomiting    Other reaction(s): GI Upset (intolerance), Vomiting (intolerance) Projectile vomiting   . Dacarbazine Other (See Comments)    Unknown reaction  . Tape Dermatitis, Itching and Rash    Patch used at dialysis    ROS Review of Systems    Objective:    Physical Exam Constitutional:      Appearance: He  is well-developed and well-nourished.  HENT:     Head: Normocephalic and atraumatic.  Cardiovascular:     Rate and Rhythm: Normal rate and regular rhythm.     Heart sounds: Normal heart sounds.  Pulmonary:     Effort: Pulmonary effort is normal.     Breath sounds: Normal breath sounds.  Skin:    General: Skin is warm and dry.  Neurological:     Mental Status: He is alert and oriented to person, place, and time.  Psychiatric:        Mood and Affect: Mood and affect  normal.        Behavior: Behavior normal.     BP (!) 119/46   Pulse 94   Ht 5' 11" (1.803 m)   SpO2 100%   BMI 30.68 kg/m  Wt Readings from Last 3 Encounters:  10/27/20 220 lb (99.8 kg)  10/08/20 227 lb 4.8 oz (103.1 kg)  09/15/20 203 lb (92.1 kg)     Health Maintenance Due  Topic Date Due  . OPHTHALMOLOGY EXAM  12/06/2019    There are no preventive care reminders to display for this patient.  Lab Results  Component Value Date   TSH 2.391 02/02/2017   Lab Results  Component Value Date   WBC 11.4 09/09/2020   HGB 12.9 (L) 10/27/2020   HCT 38.0 (L) 10/27/2020   MCV 101 09/09/2020   PLT 234 03/08/2020   Lab Results  Component Value Date   NA 135 10/27/2020   K 4.1 10/27/2020   CO2 27 10/27/2019   GLUCOSE 184 (H) 10/27/2020   BUN 43 (H) 10/27/2020   CREATININE 3.90 (H) 10/27/2020   BILITOT 0.8 05/28/2020   ALKPHOS 105 10/27/2019   AST 23 05/28/2020   ALT 17 05/28/2020   PROT 6.2 05/28/2020   ALBUMIN 2.9 (A) 09/09/2020   CALCIUM 9.2 10/27/2019   ANIONGAP 13 10/27/2019   GFR 38.90 (L) 03/10/2011   Lab Results  Component Value Date   CHOL 118 05/28/2020   Lab Results  Component Value Date   HDL 38 (L) 05/28/2020   Lab Results  Component Value Date   LDLCALC 56 05/28/2020   Lab Results  Component Value Date   TRIG 161 (H) 05/28/2020   Lab Results  Component Value Date   CHOLHDL 3.1 05/28/2020   Lab Results  Component Value Date   HGBA1C 6.7 (A) 09/15/2020      Assessment & Plan:   Problem List Items Addressed This Visit      Cardiovascular and Mediastinum   HYPERTENSION, BENIGN SYSTEMIC    Diastolic pressures have been running a little low so we will have him split his amlodipine in half to take a total of 2.5 mg daily.  His wife will monitor home blood pressures.      Atrial fibrillation ([place]) - Primary    The adjustment to Coumadin.  Will take half a tab 2 days a week, and whole tab the other 5 days a week and recheck in 10 days.       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  He says sometimes he will drag his feet.  Offered formal physical therapy for the gait instability but he declined he says he had done it before and does not think it would be helpful at this point in time.  He is still having persistent numbness and cold sensation in his hands so we did discuss maybe trying Lyrica in place of the gabapentin to see if he likes it better or maybe even Cymbalta.  Again explained that it is not going to slow the progression of neuropathy.  We also discussed trying to hold the amitriptyline he is really only taking about 12.5 mg anything more than that is too sedating.  I am not sure how much is actually really helping him set like for him to hold it for a week.      Relevant Medications   pregabalin (LYRICA) 25 MG capsule     Genitourinary   ESRD (end stage renal disease) ([place])    Fistula on left upper arm healing well.  Able to palpate a thrill on exam.        Other   Gait instability    Secondary to peripheral neuropathy.  Declined PT today.  He has done it in the past.         Meds ordered this encounter  Medications  . pregabalin (LYRICA) 25 MG capsule    Sig: Take 1 capsule (25 mg total) by mouth daily. Give after dialysis.  In place of gabapentin.    Dispense:  30 capsule    Refill:  0    Follow-up: Return in about 10 days (around 11/27/2020).    [name], MD

## 2020-11-17 NOTE — Assessment & Plan Note (Addendum)
We discussed that now that his feet are completely numb the really is not anything that we can do to reverse this or make it better and is not unusual to start to affect his balance because of changes how his feet are sending signals to his brain to tell him where his feet are at spatially.  He says sometimes he will drag his feet.  Offered formal physical therapy for the gait instability but he declined he says he had done it before and does not think it would be helpful at this point in time.  He is still having persistent numbness and cold sensation in his hands so we did discuss maybe trying Lyrica in place of the gabapentin to see if he likes it better or maybe even Cymbalta.  Again explained that it is not going to slow the progression of neuropathy.  We also discussed trying to hold the amitriptyline he is really only taking about 12.5 mg anything more than that is too sedating.  I am not sure how much is actually really helping him set like for him to hold it for a week.

## 2020-11-18 DIAGNOSIS — N186 End stage renal disease: Secondary | ICD-10-CM | POA: Diagnosis not present

## 2020-11-18 DIAGNOSIS — N2581 Secondary hyperparathyroidism of renal origin: Secondary | ICD-10-CM | POA: Diagnosis not present

## 2020-11-18 DIAGNOSIS — D631 Anemia in chronic kidney disease: Secondary | ICD-10-CM | POA: Diagnosis not present

## 2020-11-18 DIAGNOSIS — E1122 Type 2 diabetes mellitus with diabetic chronic kidney disease: Secondary | ICD-10-CM | POA: Diagnosis not present

## 2020-11-18 DIAGNOSIS — E876 Hypokalemia: Secondary | ICD-10-CM | POA: Diagnosis not present

## 2020-11-18 DIAGNOSIS — Z992 Dependence on renal dialysis: Secondary | ICD-10-CM | POA: Diagnosis not present

## 2020-11-20 DIAGNOSIS — E1122 Type 2 diabetes mellitus with diabetic chronic kidney disease: Secondary | ICD-10-CM | POA: Diagnosis not present

## 2020-11-20 DIAGNOSIS — N186 End stage renal disease: Secondary | ICD-10-CM | POA: Diagnosis not present

## 2020-11-20 DIAGNOSIS — Z992 Dependence on renal dialysis: Secondary | ICD-10-CM | POA: Diagnosis not present

## 2020-11-21 DIAGNOSIS — Z992 Dependence on renal dialysis: Secondary | ICD-10-CM | POA: Diagnosis not present

## 2020-11-21 DIAGNOSIS — E876 Hypokalemia: Secondary | ICD-10-CM | POA: Diagnosis not present

## 2020-11-21 DIAGNOSIS — E1122 Type 2 diabetes mellitus with diabetic chronic kidney disease: Secondary | ICD-10-CM | POA: Diagnosis not present

## 2020-11-21 DIAGNOSIS — D509 Iron deficiency anemia, unspecified: Secondary | ICD-10-CM | POA: Diagnosis not present

## 2020-11-21 DIAGNOSIS — N186 End stage renal disease: Secondary | ICD-10-CM | POA: Diagnosis not present

## 2020-11-21 DIAGNOSIS — D631 Anemia in chronic kidney disease: Secondary | ICD-10-CM | POA: Diagnosis not present

## 2020-11-21 DIAGNOSIS — N2581 Secondary hyperparathyroidism of renal origin: Secondary | ICD-10-CM | POA: Diagnosis not present

## 2020-11-23 ENCOUNTER — Other Ambulatory Visit: Payer: Self-pay

## 2020-11-23 DIAGNOSIS — D631 Anemia in chronic kidney disease: Secondary | ICD-10-CM | POA: Diagnosis not present

## 2020-11-23 DIAGNOSIS — I4891 Unspecified atrial fibrillation: Secondary | ICD-10-CM

## 2020-11-23 DIAGNOSIS — E876 Hypokalemia: Secondary | ICD-10-CM | POA: Diagnosis not present

## 2020-11-23 DIAGNOSIS — N2581 Secondary hyperparathyroidism of renal origin: Secondary | ICD-10-CM | POA: Diagnosis not present

## 2020-11-23 DIAGNOSIS — Z992 Dependence on renal dialysis: Secondary | ICD-10-CM | POA: Diagnosis not present

## 2020-11-23 DIAGNOSIS — N186 End stage renal disease: Secondary | ICD-10-CM | POA: Diagnosis not present

## 2020-11-23 DIAGNOSIS — D509 Iron deficiency anemia, unspecified: Secondary | ICD-10-CM | POA: Diagnosis not present

## 2020-11-23 MED ORDER — WARFARIN SODIUM 5 MG PO TABS: ORAL_TABLET | ORAL | 99 refills | Status: DC

## 2020-11-25 DIAGNOSIS — N2581 Secondary hyperparathyroidism of renal origin: Secondary | ICD-10-CM | POA: Diagnosis not present

## 2020-11-25 DIAGNOSIS — E876 Hypokalemia: Secondary | ICD-10-CM | POA: Diagnosis not present

## 2020-11-25 DIAGNOSIS — Z992 Dependence on renal dialysis: Secondary | ICD-10-CM | POA: Diagnosis not present

## 2020-11-25 DIAGNOSIS — Z961 Presence of intraocular lens: Secondary | ICD-10-CM | POA: Diagnosis not present

## 2020-11-25 DIAGNOSIS — D509 Iron deficiency anemia, unspecified: Secondary | ICD-10-CM | POA: Diagnosis not present

## 2020-11-25 DIAGNOSIS — N186 End stage renal disease: Secondary | ICD-10-CM | POA: Diagnosis not present

## 2020-11-25 DIAGNOSIS — D631 Anemia in chronic kidney disease: Secondary | ICD-10-CM | POA: Diagnosis not present

## 2020-11-25 DIAGNOSIS — E113213 Type 2 diabetes mellitus with mild nonproliferative diabetic retinopathy with macular edema, bilateral: Secondary | ICD-10-CM | POA: Diagnosis not present

## 2020-11-27 DIAGNOSIS — Z992 Dependence on renal dialysis: Secondary | ICD-10-CM | POA: Diagnosis not present

## 2020-11-27 DIAGNOSIS — E876 Hypokalemia: Secondary | ICD-10-CM | POA: Diagnosis not present

## 2020-11-27 DIAGNOSIS — N186 End stage renal disease: Secondary | ICD-10-CM | POA: Diagnosis not present

## 2020-11-27 DIAGNOSIS — N2581 Secondary hyperparathyroidism of renal origin: Secondary | ICD-10-CM | POA: Diagnosis not present

## 2020-11-27 DIAGNOSIS — D509 Iron deficiency anemia, unspecified: Secondary | ICD-10-CM | POA: Diagnosis not present

## 2020-11-27 DIAGNOSIS — D631 Anemia in chronic kidney disease: Secondary | ICD-10-CM | POA: Diagnosis not present

## 2020-11-30 DIAGNOSIS — Z992 Dependence on renal dialysis: Secondary | ICD-10-CM | POA: Diagnosis not present

## 2020-11-30 DIAGNOSIS — D631 Anemia in chronic kidney disease: Secondary | ICD-10-CM | POA: Diagnosis not present

## 2020-11-30 DIAGNOSIS — D509 Iron deficiency anemia, unspecified: Secondary | ICD-10-CM | POA: Diagnosis not present

## 2020-11-30 DIAGNOSIS — N186 End stage renal disease: Secondary | ICD-10-CM | POA: Diagnosis not present

## 2020-11-30 DIAGNOSIS — E876 Hypokalemia: Secondary | ICD-10-CM | POA: Diagnosis not present

## 2020-11-30 DIAGNOSIS — N2581 Secondary hyperparathyroidism of renal origin: Secondary | ICD-10-CM | POA: Diagnosis not present

## 2020-12-01 ENCOUNTER — Ambulatory Visit (INDEPENDENT_AMBULATORY_CARE_PROVIDER_SITE_OTHER): Payer: Medicare Other | Admitting: Family Medicine

## 2020-12-01 ENCOUNTER — Other Ambulatory Visit: Payer: Self-pay

## 2020-12-01 VITALS — BP 114/53 | HR 69

## 2020-12-01 DIAGNOSIS — I4891 Unspecified atrial fibrillation: Secondary | ICD-10-CM

## 2020-12-01 LAB — POCT INR: INR: 2.3 (ref 2.0–3.0)

## 2020-12-01 NOTE — Progress Notes (Signed)
Same dose. F/U in 3 weeks.

## 2020-12-01 NOTE — Progress Notes (Signed)
Patient advised.

## 2020-12-02 DIAGNOSIS — Z992 Dependence on renal dialysis: Secondary | ICD-10-CM | POA: Diagnosis not present

## 2020-12-02 DIAGNOSIS — E876 Hypokalemia: Secondary | ICD-10-CM | POA: Diagnosis not present

## 2020-12-02 DIAGNOSIS — N2581 Secondary hyperparathyroidism of renal origin: Secondary | ICD-10-CM | POA: Diagnosis not present

## 2020-12-02 DIAGNOSIS — D631 Anemia in chronic kidney disease: Secondary | ICD-10-CM | POA: Diagnosis not present

## 2020-12-02 DIAGNOSIS — N186 End stage renal disease: Secondary | ICD-10-CM | POA: Diagnosis not present

## 2020-12-02 DIAGNOSIS — D509 Iron deficiency anemia, unspecified: Secondary | ICD-10-CM | POA: Diagnosis not present

## 2020-12-03 ENCOUNTER — Telehealth: Payer: Self-pay | Admitting: *Deleted

## 2020-12-03 NOTE — Telephone Encounter (Signed)
LVM asking that pt rtn call or to send a my chart to let us know whether or not he is able to leave his home unassisted. This is needed to complete his [employer] form.

## 2020-12-04 DIAGNOSIS — E876 Hypokalemia: Secondary | ICD-10-CM | POA: Diagnosis not present

## 2020-12-04 DIAGNOSIS — D631 Anemia in chronic kidney disease: Secondary | ICD-10-CM | POA: Diagnosis not present

## 2020-12-04 DIAGNOSIS — N186 End stage renal disease: Secondary | ICD-10-CM | POA: Diagnosis not present

## 2020-12-04 DIAGNOSIS — D509 Iron deficiency anemia, unspecified: Secondary | ICD-10-CM | POA: Diagnosis not present

## 2020-12-04 DIAGNOSIS — N2581 Secondary hyperparathyroidism of renal origin: Secondary | ICD-10-CM | POA: Diagnosis not present

## 2020-12-04 DIAGNOSIS — Z992 Dependence on renal dialysis: Secondary | ICD-10-CM | POA: Diagnosis not present

## 2020-12-07 DIAGNOSIS — N186 End stage renal disease: Secondary | ICD-10-CM | POA: Diagnosis not present

## 2020-12-07 DIAGNOSIS — D631 Anemia in chronic kidney disease: Secondary | ICD-10-CM | POA: Diagnosis not present

## 2020-12-07 DIAGNOSIS — E876 Hypokalemia: Secondary | ICD-10-CM | POA: Diagnosis not present

## 2020-12-07 DIAGNOSIS — Z992 Dependence on renal dialysis: Secondary | ICD-10-CM | POA: Diagnosis not present

## 2020-12-07 DIAGNOSIS — D509 Iron deficiency anemia, unspecified: Secondary | ICD-10-CM | POA: Diagnosis not present

## 2020-12-07 DIAGNOSIS — N2581 Secondary hyperparathyroidism of renal origin: Secondary | ICD-10-CM | POA: Diagnosis not present

## 2020-12-08 ENCOUNTER — Telehealth: Payer: Self-pay

## 2020-12-08 NOTE — Telephone Encounter (Signed)
Patient had to r/s post op appt and wanted to make sure it was okay not to use the fistula for HD until he saw [facility]. He currently has a TDC, and denies any s/s of infection. Advised her it was fine to use the catheter until he had his post op visit.

## 2020-12-09 DIAGNOSIS — E1122 Type 2 diabetes mellitus with diabetic chronic kidney disease: Secondary | ICD-10-CM | POA: Diagnosis not present

## 2020-12-09 DIAGNOSIS — D509 Iron deficiency anemia, unspecified: Secondary | ICD-10-CM | POA: Diagnosis not present

## 2020-12-09 DIAGNOSIS — Z992 Dependence on renal dialysis: Secondary | ICD-10-CM | POA: Diagnosis not present

## 2020-12-09 DIAGNOSIS — D631 Anemia in chronic kidney disease: Secondary | ICD-10-CM | POA: Diagnosis not present

## 2020-12-09 DIAGNOSIS — E876 Hypokalemia: Secondary | ICD-10-CM | POA: Diagnosis not present

## 2020-12-09 DIAGNOSIS — N186 End stage renal disease: Secondary | ICD-10-CM | POA: Diagnosis not present

## 2020-12-09 DIAGNOSIS — N2581 Secondary hyperparathyroidism of renal origin: Secondary | ICD-10-CM | POA: Diagnosis not present

## 2020-12-11 DIAGNOSIS — Z992 Dependence on renal dialysis: Secondary | ICD-10-CM | POA: Diagnosis not present

## 2020-12-11 DIAGNOSIS — N2581 Secondary hyperparathyroidism of renal origin: Secondary | ICD-10-CM | POA: Diagnosis not present

## 2020-12-11 DIAGNOSIS — N186 End stage renal disease: Secondary | ICD-10-CM | POA: Diagnosis not present

## 2020-12-11 DIAGNOSIS — D631 Anemia in chronic kidney disease: Secondary | ICD-10-CM | POA: Diagnosis not present

## 2020-12-11 DIAGNOSIS — E876 Hypokalemia: Secondary | ICD-10-CM | POA: Diagnosis not present

## 2020-12-11 DIAGNOSIS — D509 Iron deficiency anemia, unspecified: Secondary | ICD-10-CM | POA: Diagnosis not present

## 2020-12-14 DIAGNOSIS — E876 Hypokalemia: Secondary | ICD-10-CM | POA: Diagnosis not present

## 2020-12-14 DIAGNOSIS — Z992 Dependence on renal dialysis: Secondary | ICD-10-CM | POA: Diagnosis not present

## 2020-12-14 DIAGNOSIS — N186 End stage renal disease: Secondary | ICD-10-CM | POA: Diagnosis not present

## 2020-12-14 DIAGNOSIS — D509 Iron deficiency anemia, unspecified: Secondary | ICD-10-CM | POA: Diagnosis not present

## 2020-12-14 DIAGNOSIS — N2581 Secondary hyperparathyroidism of renal origin: Secondary | ICD-10-CM | POA: Diagnosis not present

## 2020-12-14 DIAGNOSIS — D631 Anemia in chronic kidney disease: Secondary | ICD-10-CM | POA: Diagnosis not present

## 2020-12-15 ENCOUNTER — Ambulatory Visit (INDEPENDENT_AMBULATORY_CARE_PROVIDER_SITE_OTHER): Payer: Medicare Other | Admitting: Family Medicine

## 2020-12-15 ENCOUNTER — Other Ambulatory Visit: Payer: Self-pay

## 2020-12-15 VITALS — BP 126/62 | HR 84

## 2020-12-15 DIAGNOSIS — I4811 Longstanding persistent atrial fibrillation: Secondary | ICD-10-CM | POA: Diagnosis not present

## 2020-12-15 LAB — POCT INR: INR: 2.1 (ref 2.0–3.0)

## 2020-12-15 NOTE — Progress Notes (Signed)
[name] advised of recommendations.

## 2020-12-16 DIAGNOSIS — N2581 Secondary hyperparathyroidism of renal origin: Secondary | ICD-10-CM | POA: Diagnosis not present

## 2020-12-16 DIAGNOSIS — E876 Hypokalemia: Secondary | ICD-10-CM | POA: Diagnosis not present

## 2020-12-16 DIAGNOSIS — D631 Anemia in chronic kidney disease: Secondary | ICD-10-CM | POA: Diagnosis not present

## 2020-12-16 DIAGNOSIS — N186 End stage renal disease: Secondary | ICD-10-CM | POA: Diagnosis not present

## 2020-12-16 DIAGNOSIS — Z992 Dependence on renal dialysis: Secondary | ICD-10-CM | POA: Diagnosis not present

## 2020-12-16 DIAGNOSIS — D509 Iron deficiency anemia, unspecified: Secondary | ICD-10-CM | POA: Diagnosis not present

## 2020-12-16 NOTE — Progress Notes (Signed)
HPI: FU CAD, diastolic CHF and PAF. 9/56LOVF were normal. Carotid dopplers 4/18 showed less than50% bilaterally. Monitor4/19showed recurrent atrial fibrillation. Cardiac catheterization January 2020 showed 60% left main, 50% circumflex, occluded LAD, 50% first diagonal, 60% mid and distal right coronary artery and 60% posterior lateral branch. FFR of left main 0.95. Pulmonary capillary wedge pressure 24 with PA pressure 68/23. Medical therapy recommended. Patient is now on dialysis. Echocardiogram June2020 performed at [facility] showed normal LV function, moderate left ventricular hypertrophy, moderate left atrial enlargement and mild mitral regurgitation. Right heart catheterization [facility] July 2020 showed right atrial pressure of 11, pulmonary artery pressure of 58/20, pulmonary capillary wedge pressure of 24. Patient turned down for transplant and now on dialysis.Since lastseen  patient denies dyspnea, chest pain, palpitations or syncope.  Current Outpatient Medications  Medication Sig Dispense Refill  . allopurinol (ZYLOPRIM) 100 MG tablet TAKE 1 TO 1 & 1/2 (ONE & ONE-HALF) TABLETS BY MOUTH TWICE DAILY (Patient taking differently: Take 100 mg by mouth daily.) 270 tablet 1  . AMBULATORY NON FORMULARY MEDICATION Continuous positive airway pressure (CPAP) device: Auto titrate minimum 4 cm H20 to maximum of 20 cm H2O with pressure. Please provide all supplemental supplies as needed. Fax to: [phone] 1 Units 1  . amLODipine (NORVASC) 5 MG tablet Take 1 tablet (5 mg total) by mouth daily. (Patient taking differently: Take 2.5 mg by mouth daily.) 90 tablet 1  . B-D INS SYR ULTRAFINE 1CC/31G 31G X 5/16" 1 ML MISC     . blood glucose meter kit and supplies Use to check blood sugars daily E11.65    . Cholecalciferol (VITAMIN D3) 50 MCG (2000 UT) TABS Take 2,000 Units by mouth daily.     . Coenzyme Q10 (COQ10) 200 MG CAPS Take 200 mg by mouth daily.    . Continuous Blood Gluc  Receiver (FREESTYLE LIBRE 14 DAY READER) DEVI Dx DM E11.22 Check blood sugar 4 times daily. 1 each prn  . Continuous Blood Gluc Sensor (FREESTYLE LIBRE 14 DAY SENSOR) MISC Dx DM E11.22 Check blood sugar 4 times daily. 1 each prn  . doxercalciferol (HECTOROL) 4 MCG/2ML injection Doxercalciferol (Hectorol)    . furosemide (LASIX) 80 MG tablet Take 160 mg by mouth 2 (two) times daily. Morning & after lunch    . hydrALAZINE (APRESOLINE) 100 MG tablet Take 1 tablet (100 mg total) by mouth 3 (three) times daily. 270 tablet 3  . iron sucrose in sodium chloride 0.9 % 100 mL Iron Sucrose (Venofer)    . isosorbide dinitrate (ISORDIL) 20 MG tablet Take 1 tablet (20 mg total) by mouth 2 (two) times daily. 180 tablet 3  . lidocaine-prilocaine (EMLA) cream SMARTSIG:Sparingly Topical    . Methoxy PEG-Epoetin Beta (MIRCERA IJ) Mircera    . nitroGLYCERIN (NITROSTAT) 0.4 MG SL tablet Place 1 tablet (0.4 mg total) under the tongue every 5 (five) minutes as needed for chest pain. 25 tablet 0  . NOVOLIN N 100 UNIT/ML injection Inject 20-30 Units into the skin See admin instructions. Inject 30 units subcutaneously in the morning & inject 20-30 units subcutaneously at bedtime (depending on blood sugar)    . NOVOLIN R 100 UNIT/ML injection Inject 8-20 Units into the skin 3 (three) times daily with meals. Sliding Scale  5  . omega-3 acid ethyl esters (LOVAZA) 1 g capsule Take 2 g by mouth daily.     Glory Rosebush ULTRA test strip USE 1 STRIP TO CHECK GLUCOSE 3 TIMES DAILY.  E11.22 300 each 11  . pregabalin (LYRICA) 50 MG capsule Take 1 capsule (50 mg total) by mouth daily. Give after dialysis.  In place of gabapentin. 30 capsule 1  . Probiotic Product (PROBIOTIC DAILY PO) Take 420 mg by mouth daily.    . rosuvastatin (CRESTOR) 40 MG tablet Take 1 tablet by mouth once daily 90 tablet 3  . Vitamin Mixture (VITAMIN E COMPLETE PO) Take 1 capsule by mouth daily. Vitamin E complex    . warfarin (COUMADIN) 5 MG tablet TAKE 1 TABLET  BY MOUTH ONCE DAILY AT  6PM  MONITOR  INR  GOAL  SHOULD  BE  BETWEEN  2-3 90 tablet prn  . ZINC SULFATE PO Take 1 tablet by mouth daily.      No current facility-administered medications for this visit.     Past Medical History:  Diagnosis Date  . Arthritis   . Brittle bones    per pt, has soft bones in right foot/wears boot cast!  . CAD (coronary artery disease)   . Cancer (HCC)    skin cancer on arm  . Cataract    Bil/ surg scheduled for right eye 01/18/17/ left eye 02/08/17  . Charcot ankle, right 2019  . CHF (congestive heart failure) (HCC) 2015  . Diabetes mellitus    Type 2  . Heart failure, diastolic (HCC)   . History of kidney stones   . Hyperlipidemia   . Hypertension   . Macular degeneration disease   . Macular edema 2014  . OSA on CPAP   . Paroxysmal atrial fibrillation (HCC)   . Personal history of colonic polyps - adenomas 01/28/2014  . Renal insufficiency    Tu/Th/Sa Dialysis  . Shortness of breath dyspnea   . Syncope and collapse     Past Surgical History:  Procedure Laterality Date  . AV FISTULA PLACEMENT Left 09/01/2020   Procedure: LEFT ARM ARTERIOVENOUS (AV) FISTULA;  Surgeon: Cain, Brandon Christopher, MD;  [facility];  Service: Vascular;  Laterality: Left;  . BASCILIC VEIN TRANSPOSITION Left 10/27/2020   Procedure: LEFT SECOND STAGE BASILIC VEIN FISTULA TRANSPOSITION;  Surgeon: Cain, Brandon Christopher, MD;  [facility];  Service: Vascular;  Laterality: Left;  . CARPAL TUNNEL RELEASE     left hand  . COLONOSCOPY    . INTRAVASCULAR PRESSURE WIRE/FFR STUDY N/A 12/18/2018   Procedure: INTRAVASCULAR PRESSURE WIRE/FFR STUDY;  Surgeon: Arida, Muhammad A, MD;  [facility];  Service: Cardiovascular;  Laterality: N/A;  . IR FLUORO GUIDE CV LINE RIGHT  12/16/2018  . IR US GUIDE VASC ACCESS RIGHT  12/16/2018  . KNEE ARTHROSCOPY Right 09/13/2016   [facility], Dr. John Graves  . PILONIDAL CYST EXCISION    . RIGHT/LEFT HEART  CATH AND CORONARY ANGIOGRAPHY N/A 12/18/2018   Procedure: RIGHT/LEFT HEART CATH AND CORONARY ANGIOGRAPHY;  Surgeon: Arida, Muhammad A, MD;  [facility];  Service: Cardiovascular;  Laterality: N/A;    Social History   Socioeconomic History  . Marital status: Married    Spouse name: Gail  . Number of children: 5  . Years of education: 12  . Highest education level: 12th grade  Occupational History  . Occupation: Warehouse    Comment: retired  Tobacco Use  . Smoking status: Former Smoker    Packs/day: 0.50    Years: 20.00    Pack years: 10.00    Types: Cigarettes    Quit date: 03/06/1977    Years since quitting:   43.8  . Smokeless tobacco: Never Used  Vaping Use  . Vaping Use: Never used  Substance and Sexual Activity  . Alcohol use: No  . Drug use: No  . Sexual activity: Not Currently  Other Topics Concern  . Not on file  Social History Narrative   Drinks a cup of coffee daily. Drinks a lot of water daily. Goes out with church members during the week   Social Determinants of Health   Financial Resource Strain: Not on file  Food Insecurity: Not on file  Transportation Needs: Not on file  Physical Activity: Not on file  Stress: Not on file  Social Connections: Not on file  Intimate Partner Violence: Not on file    Family History  Problem Relation Age of Onset  . Diabetes Mother   . Kidney disease Mother   . Diabetes Father   . Coronary artery disease Father   . Diabetes Brother   . Diabetes Sister   . Prostate cancer Maternal Uncle   . Diabetes Maternal Grandmother   . Cancer Paternal Grandmother        unknown  . Heart attack Paternal Grandfather   . Colon cancer Neg Hx     ROS: no fevers or chills, productive cough, hemoptysis, dysphasia, odynophagia, melena, hematochezia, dysuria, hematuria, rash, seizure activity, orthopnea, PND, pedal edema, claudication. Remaining systems are negative.  Physical Exam: Well-developed well-nourished in no  acute distress.  Skin is warm and dry.  HEENT is normal.  Neck is supple.  Chest is clear to auscultation with normal expansion.  Cardiovascular exam is irregular Abdominal exam nontender or distended. No masses palpated. Extremities show no edema. neuro grossly intact   A/P  1 coronary artery disease-patient doing well with no chest pain.  Continue medical therapy.  Continue Crestor.  He is not on aspirin given need for anticoagulation.  2 paroxysmal atrial fibrillation-patient is in atrial fibrillation today but he remains asymptomatic.  His beta-blocker was discontinued previously due to bradycardia.  Continue Coumadin with goal INR 2-3.  3 end-stage renal disease dialysis dependent-managed by nephrology.  4 hyperlipidemia-continue Crestor.  5 hypertension-blood pressure controlled.  Continue present medications.  6 chronic diastolic congestive heart failure-volume is now managed by dialysis.  He appears to be euvolemic on examination.   , MD    

## 2020-12-17 ENCOUNTER — Encounter: Payer: Self-pay | Admitting: Family Medicine

## 2020-12-17 ENCOUNTER — Ambulatory Visit (INDEPENDENT_AMBULATORY_CARE_PROVIDER_SITE_OTHER): Payer: Medicare Other | Admitting: Family Medicine

## 2020-12-17 ENCOUNTER — Other Ambulatory Visit: Payer: Self-pay

## 2020-12-17 VITALS — BP 123/64 | HR 65 | Ht 71.0 in | Wt 229.3 lb

## 2020-12-17 DIAGNOSIS — E1142 Type 2 diabetes mellitus with diabetic polyneuropathy: Secondary | ICD-10-CM | POA: Diagnosis not present

## 2020-12-17 DIAGNOSIS — E11610 Type 2 diabetes mellitus with diabetic neuropathic arthropathy: Secondary | ICD-10-CM

## 2020-12-17 DIAGNOSIS — I10 Essential (primary) hypertension: Secondary | ICD-10-CM | POA: Diagnosis not present

## 2020-12-17 LAB — POCT GLYCOSYLATED HEMOGLOBIN (HGB A1C): Hemoglobin A1C: 5.8 % — AB (ref 4.0–5.6)

## 2020-12-17 MED ORDER — PREGABALIN 50 MG PO CAPS: 50.0000 mg | ORAL_CAPSULE | Freq: Every day | ORAL | 1 refills | Status: DC

## 2020-12-17 NOTE — Assessment & Plan Note (Signed)
Blood pressure looks fantastic today.  Though at home it has been up and down but he had really started increasing significantly his cheese intake.  And his wife was noticing his blood pressures were starting to climb into the 262M and 355H systolic.  She even tried increasing his amlodipine to see if it help but when he well he reduces diastolic.  He has now cut back on his cheese again.  We will continue to monitor blood pressure.

## 2020-12-17 NOTE — Assessment & Plan Note (Signed)
A1c looks phenomenal today.  Down to 5.8 in fact he is actually having a few lows on his log that he brought with him today so we discussed slightly decreasing his Novolin N a couple of units.  40 -> 36 35 -> 32 30 -> 28  Continue to monitor for lows.  We discussed that sometimes when your A1c gets this good and you are on mealtime insulin that you are more prone to having low blood sugars and I want to prevent that from happening.  He has a continuous meter which is really helpful as it will alert his wife if he becomes hypoglycemic during the evening or night.

## 2020-12-17 NOTE — Patient Instructions (Addendum)
Okay to decrease your insulin N dose So you have been giving 30 units, only give 28. Instead of 35 units, only give 32. Instead of 40 units, only give 36.  We will see if this helps reduce the lows that he has been having.  If you are still noticing frequent lows give me a call back and let us know and we can reduce even further if needed.  We will increase the Lyrica to 50 mg to see if you feel like this works better.

## 2020-12-17 NOTE — Progress Notes (Signed)
Established Patient Office Visit  Subjective:  Patient ID: Louis [name], male    DOB: 02-26-1946  Age: 75 y.o. MRN: [id number]  CC:  Chief Complaint  Patient presents with  . Diabetes    HPI Louis [name] presents for   Diabetes - no hypoglycemic events. No wounds or sores that are not healing well. No increased thirst or urination. Checking glucose at home. Taking medications as prescribed without any side effects.  Hypertension- Pt denies chest pain, SOB, dizziness, or heart palpitations.  Taking meds as directed w/o problems.  Denies medication side effects.    Neuropathy-mostly more bothersome at night.  He gets a lot of numbness tingling and burning in his feet and his hands.  He did try tapering off the amitriptyline and really did not notice any difference in his symptoms at all so he is off of that completely.  Reports that at dialysis his hemoglobin A1c was 6.0 about a week ago.   Past Medical History:  Diagnosis Date  . Arthritis   . Brittle bones    per pt, has soft bones in right foot/wears boot cast!  . CAD (coronary artery disease)   . Cancer ([place])    skin cancer on arm  . Cataract    Bil/ surg scheduled for right eye 01/18/17/ left eye 02/08/17  . Charcot ankle, right 2019  . CHF (congestive heart failure) ([place]) 2015  . Diabetes mellitus    Type 2  . Heart failure, diastolic ([place])   . History of kidney stones   . Hyperlipidemia   . Hypertension   . Macular degeneration disease   . Macular edema 2014  . OSA on CPAP   . Paroxysmal atrial fibrillation (H[facility])   . Personal history of colonic polyps - adenomas 01/28/2014  . Renal insufficiency    [facility]  . Shortness of breath dyspnea   . Syncope and collapse     Past Surgical History:  Procedure Laterality Date  . AV FISTULA PLACEMENT Left 09/01/2020   Procedure: LEFT ARM ARTERIOVENOUS (AV) FISTULA;  Surgeon: Waynetta Sandy, MD;  Location: [facility];  Service: Vascular;   Laterality: Left;  . BASCILIC VEIN TRANSPOSITION Left 10/27/2020   Procedure: LEFT SECOND STAGE BASILIC VEIN FISTULA TRANSPOSITION;  Surgeon: Waynetta Sandy, MD;  Location: [facility];  Service: Vascular;  Laterality: Left;  . CARPAL TUNNEL RELEASE     left hand  . COLONOSCOPY    . INTRAVASCULAR PRESSURE WIRE/FFR STUDY N/A 12/18/2018   Procedure: INTRAVASCULAR PRESSURE WIRE/FFR STUDY;  Surgeon: Wellington Hampshire, MD;  Location: [facility];  Service: Cardiovascular;  Laterality: N/A;  . IR FLUORO GUIDE CV LINE RIGHT  12/16/2018  . IR US GUIDE VASC ACCESS RIGHT  12/16/2018  . KNEE ARTHROSCOPY Right 09/13/2016   [facility], Dr. Dorna Leitz  . PILONIDAL CYST EXCISION    . RIGHT/LEFT HEART CATH AND CORONARY ANGIOGRAPHY N/A 12/18/2018   Procedure: RIGHT/LEFT HEART CATH AND CORONARY ANGIOGRAPHY;  Surgeon: Wellington Hampshire, MD;  Location: [facility];  Service: Cardiovascular;  Laterality: N/A;    Family History  Problem Relation Age of Onset  . Diabetes Mother   . Kidney disease Mother   . Diabetes Father   . Coronary artery disease Father   . Diabetes Brother   . Diabetes Sister   . Prostate cancer Maternal Uncle   . Diabetes Maternal Grandmother   . Cancer Paternal Grandmother  unknown  . Heart attack Paternal Grandfather   . Colon cancer Neg Hx     Social History   Socioeconomic History  . Marital status: Married    Spouse name: [name]  . Number of children: 5  . Years of education: 76  . Highest education level: 12th grade  Occupational History  . Occupation: Warehouse    Comment: retired  Tobacco Use  . Smoking status: Former Smoker    Packs/day: 0.50    Years: 20.00    Pack years: 10.00    Types: Cigarettes    Quit date: 03/06/1977    Years since quitting: 43.8  . Smokeless tobacco: Never Used  Vaping Use  . Vaping Use: Never used  Substance and Sexual Activity  . Alcohol use: No  . Drug use: No  . Sexual activity: Not Currently   Other Topics Concern  . Not on file  Social History Narrative   Drinks a cup of coffee daily. Drinks a lot of water daily. Goes out with church members during the week   Social Determinants of Health   Financial Resource Strain: Not on file  Food Insecurity: Not on file  Transportation Needs: Not on file  Physical Activity: Not on file  Stress: Not on file  Social Connections: Not on file  Intimate Partner Violence: Not on file    Outpatient Medications Prior to Visit  Medication Sig Dispense Refill  . allopurinol (ZYLOPRIM) 100 MG tablet TAKE 1 TO 1 & 1/2 (ONE & ONE-HALF) TABLETS BY MOUTH TWICE DAILY (Patient taking differently: Take 100 mg by mouth daily.) 270 tablet 1  . AMBULATORY NON FORMULARY MEDICATION Continuous positive airway pressure (CPAP) device: Auto titrate minimum 4 cm H20 to maximum of 20 cm H2O with pressure. Please provide all supplemental supplies as needed. Fax to: [phone] 1 Units 1  . amLODipine (NORVASC) 5 MG tablet Take 1 tablet (5 mg total) by mouth daily. 90 tablet 1  . B-D INS SYR ULTRAFINE 1CC/31G 31G X 5/16" 1 ML MISC     . blood glucose meter kit and supplies Use to check blood sugars daily E11.65    . Cholecalciferol (VITAMIN D3) 50 MCG (2000 UT) TABS Take 2,000 Units by mouth daily.     . Coenzyme Q10 (COQ10) 200 MG CAPS Take 200 mg by mouth daily.    . Continuous Blood Gluc Receiver (FREESTYLE LIBRE 14 DAY READER) DEVI Dx DM E11.22 Check blood sugar 4 times daily. 1 each prn  . Continuous Blood Gluc Sensor (FREESTYLE LIBRE 14 DAY SENSOR) MISC Dx DM E11.22 Check blood sugar 4 times daily. 1 each prn  . doxercalciferol (HECTOROL) 4 MCG/2ML injection Doxercalciferol (Hectorol)    . furosemide (LASIX) 80 MG tablet Take 160 mg by mouth 2 (two) times daily. Morning & after lunch    . hydrALAZINE (APRESOLINE) 100 MG tablet Take 1 tablet (100 mg total) by mouth 3 (three) times daily. 270 tablet 3  . isosorbide dinitrate (ISORDIL) 20 MG tablet Take 1  tablet (20 mg total) by mouth 2 (two) times daily. 180 tablet 3  . nitroGLYCERIN (NITROSTAT) 0.4 MG SL tablet Place 1 tablet (0.4 mg total) under the tongue every 5 (five) minutes as needed for chest pain. 25 tablet 0  . NOVOLIN N 100 UNIT/ML injection Inject 20-30 Units into the skin See admin instructions. Inject 30 units subcutaneously in the morning & inject 20-30 units subcutaneously at bedtime (depending on blood sugar)    . NOVOLIN R 100  UNIT/ML injection Inject 8-20 Units into the skin 3 (three) times daily with meals. Sliding Scale  5  . omega-3 acid ethyl esters (LOVAZA) 1 g capsule Take 2 g by mouth daily.     Glory Rosebush ULTRA test strip USE 1 STRIP TO CHECK GLUCOSE 3 TIMES DAILY. E11.22 300 each 11  . Probiotic Product (PROBIOTIC DAILY PO) Take 420 mg by mouth daily.    . rosuvastatin (CRESTOR) 40 MG tablet Take 1 tablet by mouth once daily 90 tablet 3  . traMADol (ULTRAM) 50 MG tablet Take 1 tablet (50 mg total) by mouth every 6 (six) hours as needed. 20 tablet 0  . Vitamin Mixture (VITAMIN E COMPLETE PO) Take 1 capsule by mouth daily. Vitamin E complex    . warfarin (COUMADIN) 5 MG tablet TAKE 1 TABLET BY MOUTH ONCE DAILY AT  6PM  MONITOR  INR  GOAL  SHOULD  BE  BETWEEN  2-3 90 tablet prn  . ZINC SULFATE PO Take 1 tablet by mouth daily.     Marland Kitchen amitriptyline (ELAVIL) 25 MG tablet TAKE 1 TO 2 TABLETS BY MOUTH AT BEDTIME (Patient taking differently: Take 12.5 mg by mouth at bedtime.) 90 tablet 1  . pregabalin (LYRICA) 25 MG capsule Take 1 capsule (25 mg total) by mouth daily. Give after dialysis.  In place of gabapentin. 30 capsule 0  . AMBULATORY NON FORMULARY MEDICATION Shower chair - Dx: M25.561, M54.12, M10.00 - Fax to - [phone] 1 each 0  . AMBULATORY NON FORMULARY MEDICATION Rollator walker with seat. Dx: M25.561, M54.12, M10.00 - Fax to - [phone] 1 each 0  . cefpodoxime (VANTIN) 200 MG tablet Take 200 mg by mouth 2 (two) times daily.    Marland Kitchen gabapentin (NEURONTIN) 300 MG  capsule Take 1 capsule (300 mg total) by mouth daily. 90 capsule 1   No facility-administered medications prior to visit.    Allergies  Allergen Reactions  . Hydrocodone Nausea And Vomiting    Other reaction(s): GI Upset (intolerance) Projectile vomiting   . Oxycodone Nausea And Vomiting    Other reaction(s): GI Upset (intolerance), Vomiting (intolerance) Projectile vomiting   . Dacarbazine Other (See Comments)    Unknown reaction  . Tape Dermatitis, Itching and Rash    Patch used at dialysis    ROS Review of Systems    Objective:    Physical Exam Constitutional:      Appearance: He is well-developed and well-nourished.  HENT:     Head: Normocephalic and atraumatic.  Cardiovascular:     Rate and Rhythm: Normal rate and regular rhythm.     Heart sounds: Normal heart sounds.  Pulmonary:     Effort: Pulmonary effort is normal.     Breath sounds: Normal breath sounds.  Skin:    General: Skin is warm and dry.  Neurological:     Mental Status: He is alert and oriented to person, place, and time.  Psychiatric:        Mood and Affect: Mood and affect normal.        Behavior: Behavior normal.     BP 123/64   Pulse 65   Ht 5' 11"  (1.803 m)   Wt 229 lb 4.5 oz (104 kg)   SpO2 100%   BMI 31.98 kg/m  Wt Readings from Last 3 Encounters:  12/17/20 229 lb 4.5 oz (104 kg)  10/27/20 220 lb (99.8 kg)  10/08/20 227 lb 4.8 oz (103.1 kg)     Health Maintenance Due  Topic Date Due  . OPHTHALMOLOGY EXAM  12/06/2019    There are no preventive care reminders to display for this patient.  Lab Results  Component Value Date   TSH 2.391 02/02/2017   Lab Results  Component Value Date   WBC 11.4 09/09/2020   HGB 12.9 (L) 10/27/2020   HCT 38.0 (L) 10/27/2020   MCV 101 09/09/2020   PLT 234 03/08/2020   Lab Results  Component Value Date   NA 135 10/27/2020   K 4.1 10/27/2020   CO2 27 10/27/2019   GLUCOSE 184 (H) 10/27/2020   BUN 43 (H) 10/27/2020   CREATININE 3.90  (H) 10/27/2020   BILITOT 0.8 05/28/2020   ALKPHOS 105 10/27/2019   AST 23 05/28/2020   ALT 17 05/28/2020   PROT 6.2 05/28/2020   ALBUMIN 2.9 (A) 09/09/2020   CALCIUM 9.2 10/27/2019   ANIONGAP 13 10/27/2019   GFR 38.90 (L) 03/10/2011   Lab Results  Component Value Date   CHOL 118 05/28/2020   Lab Results  Component Value Date   HDL 38 (L) 05/28/2020   Lab Results  Component Value Date   LDLCALC 56 05/28/2020   Lab Results  Component Value Date   TRIG 161 (H) 05/28/2020   Lab Results  Component Value Date   CHOLHDL 3.1 05/28/2020   Lab Results  Component Value Date   HGBA1C 5.8 (A) 12/17/2020      Assessment & Plan:   Problem List Items Addressed This Visit      Cardiovascular and Mediastinum   HYPERTENSION, BENIGN SYSTEMIC - Primary    Blood pressure looks fantastic today.  Though at home it has been up and down but he had really started increasing significantly his cheese intake.  And his wife was noticing his blood pressures were starting to climb into the 208Y and 223V systolic.  She even tried increasing his amlodipine to see if it help but when he well he reduces diastolic.  He has now cut back on his cheese again.  We will continue to monitor blood pressure.        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  Down to 5.8 in fact he is actually having a few lows on his log that he brought with him today so we discussed slightly decreasing his Novolin N a couple of units.  40 -> 36 35 -> 32 30 -> 28  Continue to monitor for lows.  We discussed that sometimes when your A1c gets this good and you are on mealtime insulin that you are more prone to having low blood sugars and I want to prevent that from happening.  He has a continuous meter which is really helpful as it will alert his wife if he becomes hypoglycemic during the evening or night.      Relevant Medications   pregabalin (LYRICA) 50 MG  capsule   Other Relevant Orders   POCT glycosylated hemoglobin (Hb A1C) (Completed)   Diabetic polyneuropathy associated with type 2 diabetes mellitus ([place])    He did taper off the amitriptyline and says he did not notice any difference in his neuropathy so working to discontinue this completely.  He was previously on gabapentin and also felt like it was not working well so we decided to switch to Lyrica instead he said he really did not notice a big difference on the 25 mg dose to work in a bump him  up to 50 mg once daily to see if this is helpful if not could consider going back to gabapentin if he would like.      Relevant Medications   pregabalin (LYRICA) 50 MG capsule      Meds ordered this encounter  Medications  . pregabalin (LYRICA) 50 MG capsule    Sig: Take 1 capsule (50 mg total) by mouth daily. Give after dialysis.  In place of gabapentin.    Dispense:  30 capsule    Refill:  1    Follow-up: Return in about 3 months (around 03/17/2021) for Diabetes follow-up.    [name], MD

## 2020-12-17 NOTE — Assessment & Plan Note (Signed)
He did taper off the amitriptyline and says he did not notice any difference in his neuropathy so working to discontinue this completely.  He was previously on gabapentin and also felt like it was not working well so we decided to switch to Lyrica instead he said he really did not notice a big difference on the 25 mg dose to work in a bump him up to 50 mg once daily to see if this is helpful if not could consider going back to gabapentin if he would like.

## 2020-12-18 DIAGNOSIS — Z992 Dependence on renal dialysis: Secondary | ICD-10-CM | POA: Diagnosis not present

## 2020-12-18 DIAGNOSIS — E876 Hypokalemia: Secondary | ICD-10-CM | POA: Diagnosis not present

## 2020-12-18 DIAGNOSIS — N186 End stage renal disease: Secondary | ICD-10-CM | POA: Diagnosis not present

## 2020-12-18 DIAGNOSIS — D631 Anemia in chronic kidney disease: Secondary | ICD-10-CM | POA: Diagnosis not present

## 2020-12-18 DIAGNOSIS — D509 Iron deficiency anemia, unspecified: Secondary | ICD-10-CM | POA: Diagnosis not present

## 2020-12-18 DIAGNOSIS — N2581 Secondary hyperparathyroidism of renal origin: Secondary | ICD-10-CM | POA: Diagnosis not present

## 2020-12-20 ENCOUNTER — Encounter: Payer: Self-pay | Admitting: Physician Assistant

## 2020-12-20 ENCOUNTER — Ambulatory Visit (INDEPENDENT_AMBULATORY_CARE_PROVIDER_SITE_OTHER): Payer: Self-pay | Admitting: Physician Assistant

## 2020-12-20 ENCOUNTER — Other Ambulatory Visit: Payer: Self-pay

## 2020-12-20 VITALS — BP 134/64 | HR 70 | Temp 98.7°F | Resp 20 | Ht 71.0 in | Wt 233.3 lb

## 2020-12-20 DIAGNOSIS — Z992 Dependence on renal dialysis: Secondary | ICD-10-CM

## 2020-12-20 DIAGNOSIS — N186 End stage renal disease: Secondary | ICD-10-CM

## 2020-12-20 NOTE — Progress Notes (Signed)
    Postoperative Access Visit   History of Present Illness   Louis [name] is a 75 y.o. year old male who presents for postoperative follow-up for revision of left basilic vein fistula with transposition by Dr. Donzetta Matters on 10/27/20. The patient's wounds are well healed.  The patient notes no steal symptoms. He currently is dialyzing via a right IJ TDC on  TTS at the [facility] location   Physical Examination   Vitals:   12/20/20 1501  BP: 134/64  Pulse: 70  Resp: 20  Temp: 98.7 F (37.1 C)  TempSrc: Temporal  SpO2: 99%  Weight: 233 lb 4.8 oz (105.8 kg)  Height: 5\' 11"  (1.803 m)   Body mass index is 32.54 kg/m.  left arm Incision is well healed, 2+ radial pulse, hand grip is 5/5, sensation in digits is  intact, palpable thrill, bruit can  be auscultated     Medical Decision Making   Louis [name] is a 75 y.o. year old male who presents s/p revision of left basilic vein fistula with transposition by Dr. Donzetta Matters on 10/27/20. Patient is doing well post op. Left upper arm incisions are well healed.   Patent is without signs or symptoms of steal syndrome  The patient's access will be ready for use immediately   The patient's tunneled dialysis catheter can be removed when Nephrology is comfortable with the performance of the left Basilic Vein fistula  The patient may follow up on a prn basis   Karoline Caldwell, PA-C [facility] Office: [phone]  Clinic MD: Dr. Trula Slade

## 2020-12-21 DIAGNOSIS — D631 Anemia in chronic kidney disease: Secondary | ICD-10-CM | POA: Diagnosis not present

## 2020-12-21 DIAGNOSIS — Z992 Dependence on renal dialysis: Secondary | ICD-10-CM | POA: Diagnosis not present

## 2020-12-21 DIAGNOSIS — E1122 Type 2 diabetes mellitus with diabetic chronic kidney disease: Secondary | ICD-10-CM | POA: Diagnosis not present

## 2020-12-21 DIAGNOSIS — N186 End stage renal disease: Secondary | ICD-10-CM | POA: Diagnosis not present

## 2020-12-21 DIAGNOSIS — D509 Iron deficiency anemia, unspecified: Secondary | ICD-10-CM | POA: Diagnosis not present

## 2020-12-21 DIAGNOSIS — E876 Hypokalemia: Secondary | ICD-10-CM | POA: Diagnosis not present

## 2020-12-21 DIAGNOSIS — N2581 Secondary hyperparathyroidism of renal origin: Secondary | ICD-10-CM | POA: Diagnosis not present

## 2020-12-23 DIAGNOSIS — N2581 Secondary hyperparathyroidism of renal origin: Secondary | ICD-10-CM | POA: Diagnosis not present

## 2020-12-23 DIAGNOSIS — E876 Hypokalemia: Secondary | ICD-10-CM | POA: Diagnosis not present

## 2020-12-23 DIAGNOSIS — D631 Anemia in chronic kidney disease: Secondary | ICD-10-CM | POA: Diagnosis not present

## 2020-12-23 DIAGNOSIS — N186 End stage renal disease: Secondary | ICD-10-CM | POA: Diagnosis not present

## 2020-12-23 DIAGNOSIS — Z992 Dependence on renal dialysis: Secondary | ICD-10-CM | POA: Diagnosis not present

## 2020-12-23 DIAGNOSIS — D509 Iron deficiency anemia, unspecified: Secondary | ICD-10-CM | POA: Diagnosis not present

## 2020-12-25 DIAGNOSIS — E876 Hypokalemia: Secondary | ICD-10-CM | POA: Diagnosis not present

## 2020-12-25 DIAGNOSIS — D631 Anemia in chronic kidney disease: Secondary | ICD-10-CM | POA: Diagnosis not present

## 2020-12-25 DIAGNOSIS — D509 Iron deficiency anemia, unspecified: Secondary | ICD-10-CM | POA: Diagnosis not present

## 2020-12-25 DIAGNOSIS — Z992 Dependence on renal dialysis: Secondary | ICD-10-CM | POA: Diagnosis not present

## 2020-12-25 DIAGNOSIS — N2581 Secondary hyperparathyroidism of renal origin: Secondary | ICD-10-CM | POA: Diagnosis not present

## 2020-12-25 DIAGNOSIS — N186 End stage renal disease: Secondary | ICD-10-CM | POA: Diagnosis not present

## 2020-12-27 ENCOUNTER — Encounter: Payer: Self-pay | Admitting: Orthopedic Surgery

## 2020-12-27 ENCOUNTER — Ambulatory Visit (INDEPENDENT_AMBULATORY_CARE_PROVIDER_SITE_OTHER): Payer: Medicare Other | Admitting: Orthopedic Surgery

## 2020-12-27 DIAGNOSIS — E11610 Type 2 diabetes mellitus with diabetic neuropathic arthropathy: Secondary | ICD-10-CM

## 2020-12-27 NOTE — Progress Notes (Signed)
Office Visit Note   Patient: Louis [name]           Date of Birth: May 01, 1946           MRN: [id number] Visit Date: 12/27/2020              Requested by: [name], [address] [place],  [place] [address] PCP: [name], MD  Chief Complaint  Patient presents with  . 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  Patient recently had blunt trauma to the right tibia and the traumatic venous ulcer healed with wearing the knee-high compression stocking.  Assessment & Plan: Visit Diagnoses:  1. 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  Follow-Up Instructions: Return if symptoms worsen or fail to improve.   Ortho Exam  Patient is alert, oriented, no adenopathy, well-dressed, normal affect, normal respiratory effort. Examination patient has good pulses he has a Charcot rocker-bottom deformity but no ulcers or calluses on the plantar aspect of his foot he does have foot drop with weakness with dorsiflexion and has instability with varus and valgus stress of the foot and hindfoot.  There is no cellulitis no signs of infection no evidence of an active Charcot process.  Imaging: No results found. No images are attached to the encounter.  Labs: Lab Results  Component Value Date   HGBA1C 5.8 (A) 12/17/2020   HGBA1C 6.7 (A) 09/15/2020   HGBA1C 6.5 09/09/2020   ESRSEDRATE 81 (H) 02/17/2016   ESRSEDRATE 71 (H) 04/20/2015   CRP 7.0 (H) 02/17/2016   LABURIC 5.2 09/20/2020   LABURIC 2.9 (L) 04/29/2019   LABURIC 5.7 12/12/2018   REPTSTATUS  10/31/2019 FINAL 10/27/2019   GRAMSTAIN  10/27/2019    FEW WBC PRESENT, PREDOMINANTLY MONONUCLEAR NO ORGANISMS SEEN    CULT  10/27/2019    NO GROWTH 3 DAYS Performed at [facility], [place] [address], [place], [place] [address]    LABORGA ENTEROCOCCUS SPECIES 01/28/2016     Lab Results  Component Value Date   ALBUMIN 2.9 (A) 09/09/2020   ALBUMIN 3.1 (L) 10/27/2019   ALBUMIN 3.4 (L) 12/17/2018   LABURIC 5.2 09/20/2020   LABURIC 2.9 (L) 04/29/2019   LABURIC 5.7 12/12/2018    Lab Results  Component Value Date   MG 2.2 12/17/2018   MG 2.4 12/16/2018   MG 2.0 02/02/2017   Lab Results  Component Value Date   VD25OH 55 02/17/2016    No results found for: PREALBUMIN CBC EXTENDED Latest Ref Rng & Units 10/27/2020 09/09/2020 09/01/2020  WBC - - 11.4 -  RBC 3.87 - 5.11 - 3.48(A) -  HGB 13.0 - 17.0 g/dL 12.9(L) 11.3(A) 14.6  HCT 39.0 - 52.0 % 38.0(L) 35(A) 43.0  PLT 140 - 400 Thousand/uL - - -  NEUTROABS 1.7 - 7.7 K/uL - - -  LYMPHSABS 0.7 - 4.0 K/uL - - -     There is no height or weight on file to calculate BMI.  Orders:  No orders of the defined types were placed in this encounter.  No orders of the defined types were placed in this encounter.    Procedures: No procedures performed  Clinical Data: No additional findings.  ROS:  All other systems negative, except as noted in the HPI. Review of Systems  Objective: Vital Signs: There were no vitals taken for this visit.  Specialty Comments:  No specialty comments available.  PMFS History: Patient Active Problem List   Diagnosis Date Noted  . Gait instability 11/17/2020  . Allergy, unspecified, initial encounter 07/14/2020  . Unspecified protein-calorie malnutrition ([place]) 05/17/2020  . Trigger finger, right middle finger 02/03/2020  . Pain in both hands 02/03/2020  . Anticoagulant long-term use 01/06/2020  . Iron deficiency anemia, unspecified 09/01/2019  . Unspecified jaundice 08/27/2019  .  Other long term (current) drug therapy 08/27/2019  . Liver disease, unspecified 08/27/2019  . Internal hemorrhoids 06/05/2019  . Poor mobility 05/02/2019  . Hypertensive left ventricular hypertrophy, without heart failure 05/02/2019  . Secondary hyperparathyroidism of renal origin ([place]) 01/25/2019  . Atrial fibrillation ([place]) 12/23/2018  . Other specified coagulation defects ([place]) 12/23/2018  . Mechanical complication of cardiovascular device 12/23/2018  . Anemia in chronic kidney disease 12/20/2018  . Anasarca 12/14/2018  . Acute diastolic CHF (congestive heart failure) ([place]) 12/14/2018  . SOB (shortness of breath) on exertion 12/12/2018  . Bilateral pseudophakia 02/27/2018  . Venous insufficiency (chronic) (peripheral) 09/06/2017  . Diabetic peripheral neuropathy ([place]) 06/12/2017  . Charcot gait 02/02/2017  . Charcot's joint 02/02/2017  . Heart failure, diastolic ([place])   . 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 Diabetic polyneuropathy associated with type 2 diabetes mellitus ([place]) 12/25/2016  . Idiopathic chronic venous hypertension of both lower extremities with inflammation 12/25/2016  . Combined forms of age-related cataract of both eyes 10/19/2016  . Cataract, right 05/18/2016  . Right cervical radiculopathy 05/12/2016  . 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 ([place]) 10/12/2015  . 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 Gout 04/20/2015  . Osteoarthritis of both acromioclavicular joints 12/03/2014  . Left shoulder pain 11/17/2014  . Unstable angina pectoris ([place]) 10/05/2014  . History of colonic polyps 01/28/2014  . Bilateral carpal tunnel syndrome 01/09/2014  . Macular edema, diabetic ([place]) 10/31/2013  . Obesity (BMI 30-39.9) 07/31/2013  . Astigmatism 01/11/2012  . Presbyopia 01/11/2012  . CAD (coronary artery disease), native coronary artery  05/17/2011  . BMI 32.0-32.9,adult 03/01/2011  . Bradycardia 02/08/2011  . Right knee pain 02/08/2011  . Hyperlipidemia   . RBBB 02/01/2011  . ESRD (end stage renal disease) ([place]) 10/23/2006  . Proteinuria 10/23/2006  . Obstructive sleep apnea 10/05/2006  . CKD stage 5 due to type 2 diabetes mellitus ([place]) 08/28/2006  . HYPERTENSION, BENIGN SYSTEMIC 08/28/2006   Past Medical History:  Diagnosis Date  . Arthritis   . Brittle bones    per pt, has soft bones in right foot/wears boot cast!  . CAD (coronary artery disease)   . Cancer ([facility])    skin cancer on arm  . Cataract    Bil/ surg scheduled for right eye 01/18/17/ left eye 02/08/17  . Charcot ankle, right 2019  . CHF (congestive heart failure) ([place]) 2015  . Diabetes mellitus    Type 2  . Heart failure, diastolic ([place])   . History of kidney stones   . Hyperlipidemia   . Hypertension   . Macular degeneration disease   . Macular edema 2014  . OSA  on CPAP   . Paroxysmal atrial fibrillation (HCC)   . Personal history of colonic polyps - adenomas 01/28/2014  . Renal insufficiency    Tu/Th/Sa Dialysis  . Shortness of breath dyspnea   . Syncope and collapse     Family History  Problem Relation Age of Onset  . Diabetes Mother   . Kidney disease Mother   . Diabetes Father   . Coronary artery disease Father   . Diabetes Brother   . Diabetes Sister   . Prostate cancer Maternal Uncle   . Diabetes Maternal Grandmother   . Cancer Paternal Grandmother        unknown  . Heart attack Paternal Grandfather   . Colon cancer Neg Hx     Past Surgical History:  Procedure Laterality Date  . AV FISTULA PLACEMENT Left 09/01/2020   Procedure: LEFT ARM ARTERIOVENOUS (AV) FISTULA;  Surgeon: Waynetta Sandy, MD;  Location: [facility];  Service: Vascular;  Laterality: Left;  . BASCILIC VEIN TRANSPOSITION Left 10/27/2020   Procedure: LEFT SECOND STAGE BASILIC VEIN FISTULA TRANSPOSITION;  Surgeon: Waynetta Sandy, MD;  Location:  [facility];  Service: Vascular;  Laterality: Left;  . CARPAL TUNNEL RELEASE     left hand  . COLONOSCOPY    . INTRAVASCULAR PRESSURE WIRE/FFR STUDY N/A 12/18/2018   Procedure: INTRAVASCULAR PRESSURE WIRE/FFR STUDY;  Surgeon: Wellington Hampshire, MD;  Location: [facility];  Service: Cardiovascular;  Laterality: N/A;  . IR FLUORO GUIDE CV LINE RIGHT  12/16/2018  . IR US GUIDE VASC ACCESS RIGHT  12/16/2018  . KNEE ARTHROSCOPY Right 09/13/2016   [facility], Dr. Dorna Leitz  . PILONIDAL CYST EXCISION    . RIGHT/LEFT HEART CATH AND CORONARY ANGIOGRAPHY N/A 12/18/2018   Procedure: RIGHT/LEFT HEART CATH AND CORONARY ANGIOGRAPHY;  Surgeon: Wellington Hampshire, MD;  Location: [facility];  Service: Cardiovascular;  Laterality: N/A;   Social History   Occupational History  . Occupation: Warehouse    Comment: retired  Tobacco Use  . Smoking status: Former Smoker    Packs/day: 0.50    Years: 20.00    Pack years: 10.00    Types: Cigarettes    Quit date: 03/06/1977    Years since quitting: 43.8  . Smokeless tobacco: Never Used  Vaping Use  . Vaping Use: Never used  Substance and Sexual Activity  . Alcohol use: No  . Drug use: No  . Sexual activity: Not Currently

## 2020-12-28 DIAGNOSIS — E876 Hypokalemia: Secondary | ICD-10-CM | POA: Diagnosis not present

## 2020-12-28 DIAGNOSIS — N186 End stage renal disease: Secondary | ICD-10-CM | POA: Diagnosis not present

## 2020-12-28 DIAGNOSIS — D631 Anemia in chronic kidney disease: Secondary | ICD-10-CM | POA: Diagnosis not present

## 2020-12-28 DIAGNOSIS — N2581 Secondary hyperparathyroidism of renal origin: Secondary | ICD-10-CM | POA: Diagnosis not present

## 2020-12-28 DIAGNOSIS — D509 Iron deficiency anemia, unspecified: Secondary | ICD-10-CM | POA: Diagnosis not present

## 2020-12-28 DIAGNOSIS — Z992 Dependence on renal dialysis: Secondary | ICD-10-CM | POA: Diagnosis not present

## 2020-12-29 ENCOUNTER — Ambulatory Visit (INDEPENDENT_AMBULATORY_CARE_PROVIDER_SITE_OTHER): Payer: Medicare Other | Admitting: Cardiology

## 2020-12-29 ENCOUNTER — Other Ambulatory Visit: Payer: Self-pay

## 2020-12-29 ENCOUNTER — Encounter: Payer: Self-pay | Admitting: Cardiology

## 2020-12-29 ENCOUNTER — Ambulatory Visit (INDEPENDENT_AMBULATORY_CARE_PROVIDER_SITE_OTHER): Payer: Medicare Other | Admitting: Family Medicine

## 2020-12-29 ENCOUNTER — Ambulatory Visit: Payer: Self-pay | Admitting: *Deleted

## 2020-12-29 ENCOUNTER — Encounter: Payer: Self-pay | Admitting: *Deleted

## 2020-12-29 VITALS — BP 144/67 | HR 59 | Ht 67.0 in | Wt 230.8 lb

## 2020-12-29 VITALS — BP 117/70 | HR 65 | Temp 97.7°F

## 2020-12-29 DIAGNOSIS — I10 Essential (primary) hypertension: Secondary | ICD-10-CM

## 2020-12-29 DIAGNOSIS — I2510 Atherosclerotic heart disease of native coronary artery without angina pectoris: Secondary | ICD-10-CM | POA: Diagnosis not present

## 2020-12-29 DIAGNOSIS — I4811 Longstanding persistent atrial fibrillation: Secondary | ICD-10-CM

## 2020-12-29 DIAGNOSIS — E7800 Pure hypercholesterolemia, unspecified: Secondary | ICD-10-CM | POA: Diagnosis not present

## 2020-12-29 DIAGNOSIS — I480 Paroxysmal atrial fibrillation: Secondary | ICD-10-CM

## 2020-12-29 LAB — POCT INR: INR: 3 (ref 2.0–3.0)

## 2020-12-29 MED ORDER — NITROGLYCERIN 0.4 MG SL SUBL: 0.4000 mg | SUBLINGUAL_TABLET | SUBLINGUAL | 11 refills | Status: AC | PRN

## 2020-12-29 MED ORDER — ISOSORBIDE DINITRATE 20 MG PO TABS
20.0000 mg | ORAL_TABLET | Freq: Two times a day (BID) | ORAL | 3 refills | Status: DC
Start: 2020-12-29 — End: 2022-01-10

## 2020-12-29 MED ORDER — AMLODIPINE BESYLATE 2.5 MG PO TABS: 2.5000 mg | ORAL_TABLET | Freq: Every day | ORAL | 3 refills | Status: DC

## 2020-12-29 NOTE — Progress Notes (Signed)
Error

## 2020-12-29 NOTE — Patient Instructions (Signed)

## 2020-12-29 NOTE — Addendum Note (Signed)
Addended by: Cristopher Estimable on: 12/29/2020 04:28 PM   Modules accepted: Orders

## 2020-12-29 NOTE — Progress Notes (Signed)
Pt here for INR check no missed doses, diet changes,bruising,bleeding,CP,SOB  2.5 mg on Tue; 5 mg all other days.  Pt reports that he started taking 75 mg of the Lyrica and hasn't noticed an improvement and would like to switch back to the Gabapentin.   As I was finishing up the visit pt stated that he was feeling "nauseaous". He was given an emesis basin and he vomited. Cool packs were applied to patient. His wife checked his BS and it was 205 she stated that they had eaten around 12pm and it was at a restaurant they had eaten at before.   Pt did begin to feel better. He had an appointment with Dr. Stanford Breed today. I placed him in a wheelchair and helped him downstairs to his next appointment.     Will call pt for and advise him for next rtn date for inr

## 2020-12-30 DIAGNOSIS — D631 Anemia in chronic kidney disease: Secondary | ICD-10-CM | POA: Diagnosis not present

## 2020-12-30 DIAGNOSIS — E113213 Type 2 diabetes mellitus with mild nonproliferative diabetic retinopathy with macular edema, bilateral: Secondary | ICD-10-CM | POA: Diagnosis not present

## 2020-12-30 DIAGNOSIS — N186 End stage renal disease: Secondary | ICD-10-CM | POA: Diagnosis not present

## 2020-12-30 DIAGNOSIS — D509 Iron deficiency anemia, unspecified: Secondary | ICD-10-CM | POA: Diagnosis not present

## 2020-12-30 DIAGNOSIS — Z961 Presence of intraocular lens: Secondary | ICD-10-CM | POA: Diagnosis not present

## 2020-12-30 DIAGNOSIS — E876 Hypokalemia: Secondary | ICD-10-CM | POA: Diagnosis not present

## 2020-12-30 DIAGNOSIS — N2581 Secondary hyperparathyroidism of renal origin: Secondary | ICD-10-CM | POA: Diagnosis not present

## 2020-12-30 DIAGNOSIS — Z992 Dependence on renal dialysis: Secondary | ICD-10-CM | POA: Diagnosis not present

## 2020-12-30 LAB — HM DIABETES EYE EXAM

## 2021-01-01 DIAGNOSIS — N186 End stage renal disease: Secondary | ICD-10-CM | POA: Diagnosis not present

## 2021-01-01 DIAGNOSIS — E876 Hypokalemia: Secondary | ICD-10-CM | POA: Diagnosis not present

## 2021-01-01 DIAGNOSIS — D509 Iron deficiency anemia, unspecified: Secondary | ICD-10-CM | POA: Diagnosis not present

## 2021-01-01 DIAGNOSIS — D631 Anemia in chronic kidney disease: Secondary | ICD-10-CM | POA: Diagnosis not present

## 2021-01-01 DIAGNOSIS — Z992 Dependence on renal dialysis: Secondary | ICD-10-CM | POA: Diagnosis not present

## 2021-01-01 DIAGNOSIS — N2581 Secondary hyperparathyroidism of renal origin: Secondary | ICD-10-CM | POA: Diagnosis not present

## 2021-01-04 DIAGNOSIS — D509 Iron deficiency anemia, unspecified: Secondary | ICD-10-CM | POA: Diagnosis not present

## 2021-01-04 DIAGNOSIS — N2581 Secondary hyperparathyroidism of renal origin: Secondary | ICD-10-CM | POA: Diagnosis not present

## 2021-01-04 DIAGNOSIS — D631 Anemia in chronic kidney disease: Secondary | ICD-10-CM | POA: Diagnosis not present

## 2021-01-04 DIAGNOSIS — Z992 Dependence on renal dialysis: Secondary | ICD-10-CM | POA: Diagnosis not present

## 2021-01-04 DIAGNOSIS — N186 End stage renal disease: Secondary | ICD-10-CM | POA: Diagnosis not present

## 2021-01-04 DIAGNOSIS — E876 Hypokalemia: Secondary | ICD-10-CM | POA: Diagnosis not present

## 2021-01-06 DIAGNOSIS — N186 End stage renal disease: Secondary | ICD-10-CM | POA: Diagnosis not present

## 2021-01-06 DIAGNOSIS — E876 Hypokalemia: Secondary | ICD-10-CM | POA: Diagnosis not present

## 2021-01-06 DIAGNOSIS — N2581 Secondary hyperparathyroidism of renal origin: Secondary | ICD-10-CM | POA: Diagnosis not present

## 2021-01-06 DIAGNOSIS — Z992 Dependence on renal dialysis: Secondary | ICD-10-CM | POA: Diagnosis not present

## 2021-01-06 DIAGNOSIS — D509 Iron deficiency anemia, unspecified: Secondary | ICD-10-CM | POA: Diagnosis not present

## 2021-01-06 DIAGNOSIS — D631 Anemia in chronic kidney disease: Secondary | ICD-10-CM | POA: Diagnosis not present

## 2021-01-08 DIAGNOSIS — N2581 Secondary hyperparathyroidism of renal origin: Secondary | ICD-10-CM | POA: Diagnosis not present

## 2021-01-08 DIAGNOSIS — E876 Hypokalemia: Secondary | ICD-10-CM | POA: Diagnosis not present

## 2021-01-08 DIAGNOSIS — D509 Iron deficiency anemia, unspecified: Secondary | ICD-10-CM | POA: Diagnosis not present

## 2021-01-08 DIAGNOSIS — D631 Anemia in chronic kidney disease: Secondary | ICD-10-CM | POA: Diagnosis not present

## 2021-01-08 DIAGNOSIS — Z992 Dependence on renal dialysis: Secondary | ICD-10-CM | POA: Diagnosis not present

## 2021-01-08 DIAGNOSIS — N186 End stage renal disease: Secondary | ICD-10-CM | POA: Diagnosis not present

## 2021-01-11 DIAGNOSIS — Z992 Dependence on renal dialysis: Secondary | ICD-10-CM | POA: Diagnosis not present

## 2021-01-11 DIAGNOSIS — D509 Iron deficiency anemia, unspecified: Secondary | ICD-10-CM | POA: Diagnosis not present

## 2021-01-11 DIAGNOSIS — N186 End stage renal disease: Secondary | ICD-10-CM | POA: Diagnosis not present

## 2021-01-11 DIAGNOSIS — E876 Hypokalemia: Secondary | ICD-10-CM | POA: Diagnosis not present

## 2021-01-11 DIAGNOSIS — N2581 Secondary hyperparathyroidism of renal origin: Secondary | ICD-10-CM | POA: Diagnosis not present

## 2021-01-11 DIAGNOSIS — D631 Anemia in chronic kidney disease: Secondary | ICD-10-CM | POA: Diagnosis not present

## 2021-01-12 ENCOUNTER — Ambulatory Visit (INDEPENDENT_AMBULATORY_CARE_PROVIDER_SITE_OTHER): Payer: Medicare Other | Admitting: Family Medicine

## 2021-01-12 ENCOUNTER — Telehealth: Payer: Self-pay

## 2021-01-12 ENCOUNTER — Other Ambulatory Visit: Payer: Self-pay

## 2021-01-12 VITALS — BP 111/46 | HR 92

## 2021-01-12 DIAGNOSIS — I4891 Unspecified atrial fibrillation: Secondary | ICD-10-CM

## 2021-01-12 DIAGNOSIS — G4733 Obstructive sleep apnea (adult) (pediatric): Secondary | ICD-10-CM

## 2021-01-12 LAB — POCT INR: INR: 3.6 — AB (ref 2.0–3.0)

## 2021-01-12 NOTE — Telephone Encounter (Signed)
[employer] needs Dr Madilyn Fireman to sign a form for CPAP supplies. I asked the company to fax the form to [place].   [employer] [phone]

## 2021-01-13 DIAGNOSIS — N2581 Secondary hyperparathyroidism of renal origin: Secondary | ICD-10-CM | POA: Diagnosis not present

## 2021-01-13 DIAGNOSIS — N186 End stage renal disease: Secondary | ICD-10-CM | POA: Diagnosis not present

## 2021-01-13 DIAGNOSIS — E876 Hypokalemia: Secondary | ICD-10-CM | POA: Diagnosis not present

## 2021-01-13 DIAGNOSIS — D631 Anemia in chronic kidney disease: Secondary | ICD-10-CM | POA: Diagnosis not present

## 2021-01-13 DIAGNOSIS — Z992 Dependence on renal dialysis: Secondary | ICD-10-CM | POA: Diagnosis not present

## 2021-01-13 DIAGNOSIS — D509 Iron deficiency anemia, unspecified: Secondary | ICD-10-CM | POA: Diagnosis not present

## 2021-01-13 MED ORDER — AMBULATORY NON FORMULARY MEDICATION: 99 refills | Status: DC

## 2021-01-13 NOTE — Telephone Encounter (Signed)
Faxed order to [employer] [phone]

## 2021-01-14 NOTE — Progress Notes (Signed)
Gail advised

## 2021-01-15 DIAGNOSIS — N186 End stage renal disease: Secondary | ICD-10-CM | POA: Diagnosis not present

## 2021-01-15 DIAGNOSIS — Z992 Dependence on renal dialysis: Secondary | ICD-10-CM | POA: Diagnosis not present

## 2021-01-15 DIAGNOSIS — D509 Iron deficiency anemia, unspecified: Secondary | ICD-10-CM | POA: Diagnosis not present

## 2021-01-15 DIAGNOSIS — D631 Anemia in chronic kidney disease: Secondary | ICD-10-CM | POA: Diagnosis not present

## 2021-01-15 DIAGNOSIS — N2581 Secondary hyperparathyroidism of renal origin: Secondary | ICD-10-CM | POA: Diagnosis not present

## 2021-01-15 DIAGNOSIS — E876 Hypokalemia: Secondary | ICD-10-CM | POA: Diagnosis not present

## 2021-01-18 DIAGNOSIS — E876 Hypokalemia: Secondary | ICD-10-CM | POA: Diagnosis not present

## 2021-01-18 DIAGNOSIS — N186 End stage renal disease: Secondary | ICD-10-CM | POA: Diagnosis not present

## 2021-01-18 DIAGNOSIS — E113213 Type 2 diabetes mellitus with mild nonproliferative diabetic retinopathy with macular edema, bilateral: Secondary | ICD-10-CM | POA: Diagnosis not present

## 2021-01-18 DIAGNOSIS — H26493 Other secondary cataract, bilateral: Secondary | ICD-10-CM | POA: Diagnosis not present

## 2021-01-18 DIAGNOSIS — D631 Anemia in chronic kidney disease: Secondary | ICD-10-CM | POA: Diagnosis not present

## 2021-01-18 DIAGNOSIS — D509 Iron deficiency anemia, unspecified: Secondary | ICD-10-CM | POA: Diagnosis not present

## 2021-01-18 DIAGNOSIS — Z992 Dependence on renal dialysis: Secondary | ICD-10-CM | POA: Diagnosis not present

## 2021-01-18 DIAGNOSIS — Z961 Presence of intraocular lens: Secondary | ICD-10-CM | POA: Diagnosis not present

## 2021-01-18 DIAGNOSIS — N2581 Secondary hyperparathyroidism of renal origin: Secondary | ICD-10-CM | POA: Diagnosis not present

## 2021-01-18 DIAGNOSIS — E1122 Type 2 diabetes mellitus with diabetic chronic kidney disease: Secondary | ICD-10-CM | POA: Diagnosis not present

## 2021-01-18 DIAGNOSIS — Z794 Long term (current) use of insulin: Secondary | ICD-10-CM | POA: Diagnosis not present

## 2021-01-19 ENCOUNTER — Ambulatory Visit (INDEPENDENT_AMBULATORY_CARE_PROVIDER_SITE_OTHER): Payer: Medicare Other | Admitting: Family Medicine

## 2021-01-19 ENCOUNTER — Other Ambulatory Visit: Payer: Self-pay

## 2021-01-19 ENCOUNTER — Encounter: Payer: Self-pay | Admitting: Family Medicine

## 2021-01-19 VITALS — BP 120/44 | HR 94 | Temp 98.2°F | Resp 20 | Ht 67.0 in | Wt 230.0 lb

## 2021-01-19 DIAGNOSIS — I4891 Unspecified atrial fibrillation: Secondary | ICD-10-CM

## 2021-01-19 LAB — POCT INR: INR: 2.1 (ref 2.0–3.0)

## 2021-01-19 MED ORDER — FUROSEMIDE 80 MG PO TABS
160.0000 mg | ORAL_TABLET | Freq: Two times a day (BID) | ORAL | 1 refills | Status: DC
Start: 2021-01-19 — End: 2021-12-01

## 2021-01-19 NOTE — Telephone Encounter (Signed)
Louis [name] states Charlie needs a refill on furosemide 80 mg taking 2 tablets twice daily. Historical provider. Pended prescription.

## 2021-01-20 DIAGNOSIS — N186 End stage renal disease: Secondary | ICD-10-CM | POA: Diagnosis not present

## 2021-01-20 DIAGNOSIS — E113213 Type 2 diabetes mellitus with mild nonproliferative diabetic retinopathy with macular edema, bilateral: Secondary | ICD-10-CM | POA: Diagnosis not present

## 2021-01-20 DIAGNOSIS — D509 Iron deficiency anemia, unspecified: Secondary | ICD-10-CM | POA: Diagnosis not present

## 2021-01-20 DIAGNOSIS — N2581 Secondary hyperparathyroidism of renal origin: Secondary | ICD-10-CM | POA: Diagnosis not present

## 2021-01-20 DIAGNOSIS — H26493 Other secondary cataract, bilateral: Secondary | ICD-10-CM | POA: Diagnosis not present

## 2021-01-20 DIAGNOSIS — D631 Anemia in chronic kidney disease: Secondary | ICD-10-CM | POA: Diagnosis not present

## 2021-01-20 DIAGNOSIS — Z794 Long term (current) use of insulin: Secondary | ICD-10-CM | POA: Diagnosis not present

## 2021-01-20 DIAGNOSIS — Z992 Dependence on renal dialysis: Secondary | ICD-10-CM | POA: Diagnosis not present

## 2021-01-20 DIAGNOSIS — Z961 Presence of intraocular lens: Secondary | ICD-10-CM | POA: Diagnosis not present

## 2021-01-20 DIAGNOSIS — E876 Hypokalemia: Secondary | ICD-10-CM | POA: Diagnosis not present

## 2021-01-22 DIAGNOSIS — D509 Iron deficiency anemia, unspecified: Secondary | ICD-10-CM | POA: Diagnosis not present

## 2021-01-22 DIAGNOSIS — E876 Hypokalemia: Secondary | ICD-10-CM | POA: Diagnosis not present

## 2021-01-22 DIAGNOSIS — N2581 Secondary hyperparathyroidism of renal origin: Secondary | ICD-10-CM | POA: Diagnosis not present

## 2021-01-22 DIAGNOSIS — Z992 Dependence on renal dialysis: Secondary | ICD-10-CM | POA: Diagnosis not present

## 2021-01-22 DIAGNOSIS — D631 Anemia in chronic kidney disease: Secondary | ICD-10-CM | POA: Diagnosis not present

## 2021-01-22 DIAGNOSIS — N186 End stage renal disease: Secondary | ICD-10-CM | POA: Diagnosis not present

## 2021-01-25 DIAGNOSIS — E876 Hypokalemia: Secondary | ICD-10-CM | POA: Diagnosis not present

## 2021-01-25 DIAGNOSIS — Z992 Dependence on renal dialysis: Secondary | ICD-10-CM | POA: Diagnosis not present

## 2021-01-25 DIAGNOSIS — N186 End stage renal disease: Secondary | ICD-10-CM | POA: Diagnosis not present

## 2021-01-25 DIAGNOSIS — D509 Iron deficiency anemia, unspecified: Secondary | ICD-10-CM | POA: Diagnosis not present

## 2021-01-25 DIAGNOSIS — D631 Anemia in chronic kidney disease: Secondary | ICD-10-CM | POA: Diagnosis not present

## 2021-01-25 DIAGNOSIS — N2581 Secondary hyperparathyroidism of renal origin: Secondary | ICD-10-CM | POA: Diagnosis not present

## 2021-01-26 DIAGNOSIS — N186 End stage renal disease: Secondary | ICD-10-CM | POA: Diagnosis not present

## 2021-01-26 DIAGNOSIS — Z452 Encounter for adjustment and management of vascular access device: Secondary | ICD-10-CM | POA: Diagnosis not present

## 2021-01-26 DIAGNOSIS — Z992 Dependence on renal dialysis: Secondary | ICD-10-CM | POA: Diagnosis not present

## 2021-01-27 DIAGNOSIS — Z992 Dependence on renal dialysis: Secondary | ICD-10-CM | POA: Diagnosis not present

## 2021-01-27 DIAGNOSIS — N186 End stage renal disease: Secondary | ICD-10-CM | POA: Diagnosis not present

## 2021-01-27 DIAGNOSIS — N2581 Secondary hyperparathyroidism of renal origin: Secondary | ICD-10-CM | POA: Diagnosis not present

## 2021-01-27 DIAGNOSIS — D509 Iron deficiency anemia, unspecified: Secondary | ICD-10-CM | POA: Diagnosis not present

## 2021-01-27 DIAGNOSIS — E876 Hypokalemia: Secondary | ICD-10-CM | POA: Diagnosis not present

## 2021-01-27 DIAGNOSIS — D631 Anemia in chronic kidney disease: Secondary | ICD-10-CM | POA: Diagnosis not present

## 2021-01-29 DIAGNOSIS — N2581 Secondary hyperparathyroidism of renal origin: Secondary | ICD-10-CM | POA: Diagnosis not present

## 2021-01-29 DIAGNOSIS — Z992 Dependence on renal dialysis: Secondary | ICD-10-CM | POA: Diagnosis not present

## 2021-01-29 DIAGNOSIS — E876 Hypokalemia: Secondary | ICD-10-CM | POA: Diagnosis not present

## 2021-01-29 DIAGNOSIS — D509 Iron deficiency anemia, unspecified: Secondary | ICD-10-CM | POA: Diagnosis not present

## 2021-01-29 DIAGNOSIS — D631 Anemia in chronic kidney disease: Secondary | ICD-10-CM | POA: Diagnosis not present

## 2021-01-29 DIAGNOSIS — N186 End stage renal disease: Secondary | ICD-10-CM | POA: Diagnosis not present

## 2021-02-01 DIAGNOSIS — N2581 Secondary hyperparathyroidism of renal origin: Secondary | ICD-10-CM | POA: Diagnosis not present

## 2021-02-01 DIAGNOSIS — D631 Anemia in chronic kidney disease: Secondary | ICD-10-CM | POA: Diagnosis not present

## 2021-02-01 DIAGNOSIS — Z992 Dependence on renal dialysis: Secondary | ICD-10-CM | POA: Diagnosis not present

## 2021-02-01 DIAGNOSIS — N186 End stage renal disease: Secondary | ICD-10-CM | POA: Diagnosis not present

## 2021-02-01 DIAGNOSIS — D509 Iron deficiency anemia, unspecified: Secondary | ICD-10-CM | POA: Diagnosis not present

## 2021-02-01 DIAGNOSIS — E876 Hypokalemia: Secondary | ICD-10-CM | POA: Diagnosis not present

## 2021-02-02 ENCOUNTER — Ambulatory Visit (INDEPENDENT_AMBULATORY_CARE_PROVIDER_SITE_OTHER): Payer: Medicare Other | Admitting: Family Medicine

## 2021-02-02 ENCOUNTER — Other Ambulatory Visit: Payer: Self-pay

## 2021-02-02 VITALS — BP 125/52 | HR 81

## 2021-02-02 DIAGNOSIS — I4891 Unspecified atrial fibrillation: Secondary | ICD-10-CM

## 2021-02-02 LAB — POCT INR: INR: 2.2 (ref 2.0–3.0)

## 2021-02-02 NOTE — Progress Notes (Signed)
Louis [name] advised of recommendations.

## 2021-02-03 DIAGNOSIS — E876 Hypokalemia: Secondary | ICD-10-CM | POA: Diagnosis not present

## 2021-02-03 DIAGNOSIS — Z992 Dependence on renal dialysis: Secondary | ICD-10-CM | POA: Diagnosis not present

## 2021-02-03 DIAGNOSIS — N186 End stage renal disease: Secondary | ICD-10-CM | POA: Diagnosis not present

## 2021-02-03 DIAGNOSIS — D631 Anemia in chronic kidney disease: Secondary | ICD-10-CM | POA: Diagnosis not present

## 2021-02-03 DIAGNOSIS — N2581 Secondary hyperparathyroidism of renal origin: Secondary | ICD-10-CM | POA: Diagnosis not present

## 2021-02-03 DIAGNOSIS — D509 Iron deficiency anemia, unspecified: Secondary | ICD-10-CM | POA: Diagnosis not present

## 2021-02-05 DIAGNOSIS — D509 Iron deficiency anemia, unspecified: Secondary | ICD-10-CM | POA: Diagnosis not present

## 2021-02-05 DIAGNOSIS — Z992 Dependence on renal dialysis: Secondary | ICD-10-CM | POA: Diagnosis not present

## 2021-02-05 DIAGNOSIS — N186 End stage renal disease: Secondary | ICD-10-CM | POA: Diagnosis not present

## 2021-02-05 DIAGNOSIS — D631 Anemia in chronic kidney disease: Secondary | ICD-10-CM | POA: Diagnosis not present

## 2021-02-05 DIAGNOSIS — E876 Hypokalemia: Secondary | ICD-10-CM | POA: Diagnosis not present

## 2021-02-05 DIAGNOSIS — N2581 Secondary hyperparathyroidism of renal origin: Secondary | ICD-10-CM | POA: Diagnosis not present

## 2021-02-08 DIAGNOSIS — D631 Anemia in chronic kidney disease: Secondary | ICD-10-CM | POA: Diagnosis not present

## 2021-02-08 DIAGNOSIS — N186 End stage renal disease: Secondary | ICD-10-CM | POA: Diagnosis not present

## 2021-02-08 DIAGNOSIS — N2581 Secondary hyperparathyroidism of renal origin: Secondary | ICD-10-CM | POA: Diagnosis not present

## 2021-02-08 DIAGNOSIS — Z992 Dependence on renal dialysis: Secondary | ICD-10-CM | POA: Diagnosis not present

## 2021-02-08 DIAGNOSIS — D509 Iron deficiency anemia, unspecified: Secondary | ICD-10-CM | POA: Diagnosis not present

## 2021-02-08 DIAGNOSIS — E876 Hypokalemia: Secondary | ICD-10-CM | POA: Diagnosis not present

## 2021-02-10 DIAGNOSIS — D631 Anemia in chronic kidney disease: Secondary | ICD-10-CM | POA: Diagnosis not present

## 2021-02-10 DIAGNOSIS — Z992 Dependence on renal dialysis: Secondary | ICD-10-CM | POA: Diagnosis not present

## 2021-02-10 DIAGNOSIS — E876 Hypokalemia: Secondary | ICD-10-CM | POA: Diagnosis not present

## 2021-02-10 DIAGNOSIS — N186 End stage renal disease: Secondary | ICD-10-CM | POA: Diagnosis not present

## 2021-02-10 DIAGNOSIS — N2581 Secondary hyperparathyroidism of renal origin: Secondary | ICD-10-CM | POA: Diagnosis not present

## 2021-02-10 DIAGNOSIS — D509 Iron deficiency anemia, unspecified: Secondary | ICD-10-CM | POA: Diagnosis not present

## 2021-02-12 DIAGNOSIS — D631 Anemia in chronic kidney disease: Secondary | ICD-10-CM | POA: Diagnosis not present

## 2021-02-12 DIAGNOSIS — N186 End stage renal disease: Secondary | ICD-10-CM | POA: Diagnosis not present

## 2021-02-12 DIAGNOSIS — D509 Iron deficiency anemia, unspecified: Secondary | ICD-10-CM | POA: Diagnosis not present

## 2021-02-12 DIAGNOSIS — E876 Hypokalemia: Secondary | ICD-10-CM | POA: Diagnosis not present

## 2021-02-12 DIAGNOSIS — N2581 Secondary hyperparathyroidism of renal origin: Secondary | ICD-10-CM | POA: Diagnosis not present

## 2021-02-12 DIAGNOSIS — Z992 Dependence on renal dialysis: Secondary | ICD-10-CM | POA: Diagnosis not present

## 2021-02-15 DIAGNOSIS — N2581 Secondary hyperparathyroidism of renal origin: Secondary | ICD-10-CM | POA: Diagnosis not present

## 2021-02-15 DIAGNOSIS — E876 Hypokalemia: Secondary | ICD-10-CM | POA: Diagnosis not present

## 2021-02-15 DIAGNOSIS — D631 Anemia in chronic kidney disease: Secondary | ICD-10-CM | POA: Diagnosis not present

## 2021-02-15 DIAGNOSIS — D509 Iron deficiency anemia, unspecified: Secondary | ICD-10-CM | POA: Diagnosis not present

## 2021-02-15 DIAGNOSIS — N186 End stage renal disease: Secondary | ICD-10-CM | POA: Diagnosis not present

## 2021-02-15 DIAGNOSIS — Z992 Dependence on renal dialysis: Secondary | ICD-10-CM | POA: Diagnosis not present

## 2021-02-16 ENCOUNTER — Other Ambulatory Visit: Payer: Self-pay

## 2021-02-16 ENCOUNTER — Encounter: Payer: Self-pay | Admitting: Medical-Surgical

## 2021-02-16 ENCOUNTER — Ambulatory Visit (INDEPENDENT_AMBULATORY_CARE_PROVIDER_SITE_OTHER): Payer: Medicare Other | Admitting: Medical-Surgical

## 2021-02-16 ENCOUNTER — Ambulatory Visit: Payer: Medicare Other

## 2021-02-16 VITALS — BP 137/67 | HR 97 | Temp 97.8°F | Ht 67.0 in | Wt 236.1 lb

## 2021-02-16 DIAGNOSIS — B356 Tinea cruris: Secondary | ICD-10-CM

## 2021-02-16 MED ORDER — CLOTRIMAZOLE 1 % EX CREA
1.0000 | TOPICAL_CREAM | Freq: Two times a day (BID) | CUTANEOUS | 1 refills | Status: DC
Start: 2021-02-16 — End: 2021-02-22

## 2021-02-16 MED ORDER — TRIAMCINOLONE ACETONIDE 0.5 % EX OINT: 1.0000 "application " | TOPICAL_OINTMENT | Freq: Two times a day (BID) | CUTANEOUS | 3 refills | Status: DC

## 2021-02-16 NOTE — Progress Notes (Signed)
Subjective:    CC: rash in groin  HPI: Pleasant 75 year old male accompanied by his wife presenting for evaluation of an erythematous rash that has been present in the groin for a couple of months.  Notes that the groin has gotten increasingly itchy despite their use of several different products including Goldbond powder, triamcinolone topical cream, and silver gel.  Endorses having excess moisture due to sweating in the affected area, especially when sitting at dialysis for several hours.  No new cosmetics, materials, chemicals, foods, or medications.  I reviewed the past medical history, family history, social history, surgical history, and allergies today and no changes were needed.  Please see the problem list section below in epic for further details.  Past Medical History: Past Medical History:  Diagnosis Date  . Arthritis   . Brittle bones    per pt, has soft bones in right foot/wears boot cast!  . CAD (coronary artery disease)   . Cancer ([place])    skin cancer on arm  . Cataract    Bil/ surg scheduled for right eye 01/18/17/ left eye 02/08/17  . Charcot ankle, right 2019  . CHF (congestive heart failure) ([place]) 2015  . Diabetes mellitus    Type 2  . Heart failure, diastolic ([place])   . History of kidney stones   . Hyperlipidemia   . Hypertension   . Macular degeneration disease   . Macular edema 2014  . OSA on CPAP   . Paroxysmal atrial fibrillation (HCC)   . Personal history of colonic polyps - adenomas 01/28/2014  . Renal insufficiency    Tu/Th/Sa Dialysis  . Shortness of breath dyspnea   . Syncope and collapse    Past Surgical History: Past Surgical History:  Procedure Laterality Date  . AV FISTULA PLACEMENT Left 09/01/2020   Procedure: LEFT ARM ARTERIOVENOUS (AV) FISTULA;  Surgeon: Waynetta Sandy, MD;  Location: [facility];  Service: Vascular;  Laterality: Left;  . BASCILIC VEIN TRANSPOSITION Left 10/27/2020   Procedure: LEFT SECOND STAGE BASILIC VEIN FISTULA  TRANSPOSITION;  Surgeon: Waynetta Sandy, MD;  Location: [facility];  Service: Vascular;  Laterality: Left;  . CARPAL TUNNEL RELEASE     left hand  . COLONOSCOPY    . INTRAVASCULAR PRESSURE WIRE/FFR STUDY N/A 12/18/2018   Procedure: INTRAVASCULAR PRESSURE WIRE/FFR STUDY;  Surgeon: Wellington Hampshire, MD;  Location: [facility];  Service: Cardiovascular;  Laterality: N/A;  . IR FLUORO GUIDE CV LINE RIGHT  12/16/2018  . IR US GUIDE VASC ACCESS RIGHT  12/16/2018  . KNEE ARTHROSCOPY Right 09/13/2016   [facility], Dr. Dorna Leitz  . PILONIDAL CYST EXCISION    . RIGHT/LEFT HEART CATH AND CORONARY ANGIOGRAPHY N/A 12/18/2018   Procedure: RIGHT/LEFT HEART CATH AND CORONARY ANGIOGRAPHY;  Surgeon: Wellington Hampshire, MD;  Location: [facility];  Service: Cardiovascular;  Laterality: N/A;   Social History: Social History   Socioeconomic History  . Marital status: Married    Spouse name: [name]  . Number of children: 5  . Years of education: 62  . Highest education level: 12th grade  Occupational History  . Occupation: Warehouse    Comment: retired  Tobacco Use  . Smoking status: Former Smoker    Packs/day: 0.50    Years: 20.00    Pack years: 10.00    Types: Cigarettes    Quit date: 03/06/1977    Years since quitting: 43.9  . Smokeless tobacco: Never Used  Vaping Use  . Vaping  Use: Never used  Substance and Sexual Activity  . Alcohol use: No  . Drug use: No  . Sexual activity: Not Currently  Other Topics Concern  . Not on file  Social History Narrative   Drinks a cup of coffee daily. Drinks a lot of water daily. Goes out with church members during the week   Social Determinants of Health   Financial Resource Strain: Not on file  Food Insecurity: Not on file  Transportation Needs: Not on file  Physical Activity: Not on file  Stress: Not on file  Social Connections: Not on file   Family History: Family History  Problem Relation Age of Onset  . Diabetes  Mother   . Kidney disease Mother   . Diabetes Father   . Coronary artery disease Father   . Diabetes Brother   . Diabetes Sister   . Prostate cancer Maternal Uncle   . Diabetes Maternal Grandmother   . Cancer Paternal Grandmother        unknown  . Heart attack Paternal Grandfather   . Colon cancer Neg Hx    Allergies: Allergies  Allergen Reactions  . Hydrocodone Nausea And Vomiting    Other reaction(s): GI Upset (intolerance) Projectile vomiting   . Oxycodone Nausea And Vomiting    Other reaction(s): GI Upset (intolerance), Vomiting (intolerance) Projectile vomiting   . Dacarbazine Other (See Comments)    Unknown reaction  . Tape Dermatitis, Itching and Rash    Patch used at dialysis   Medications: See med rec.  Review of Systems: See HPI for pertinent positives and negatives.   Objective:    General: Well Developed, well nourished, and in no acute distress.  Neuro: Alert and oriented x3.  HEENT: Normocephalic, atraumatic.  Skin: Warm and dry.  Flat, erythematous rash to bilateral inguinal creases, moist with no visible crusting, vesicles, papules, or pustules. Cardiac: Regular rate and rhythm, no murmurs rubs or gallops, no lower extremity edema.  Respiratory: Clear to auscultation bilaterally. Not using accessory muscles, speaking in full sentences.  Impression and Recommendations:    1. Jock itch Clotrimazole 1% cream twice daily until rash resolves then continue for another 48 hours.  If itching is not well managed with this, okay to use sparing triamcinolone ointment in the area.  Work on keeping the groin area clean and dry and avoid excess sweating when possible. - clotrimazole (CLOTRIMAZOLE ANTI-FUNGAL) 1 % cream; Apply 1 application topically 2 (two) times daily.  Dispense: 30 g; Refill: 1 - triamcinolone ointment (KENALOG) 0.5 %; Apply 1 application topically 2 (two) times daily. To affected area, avoid eyes and face  Dispense: 30 g; Refill: 3  Return if  symptoms worsen or fail to improve. ___________________________________________ Clearnce Sorrel, DNP, APRN, FNP-BC [facility]

## 2021-02-17 DIAGNOSIS — Z992 Dependence on renal dialysis: Secondary | ICD-10-CM | POA: Diagnosis not present

## 2021-02-17 DIAGNOSIS — D509 Iron deficiency anemia, unspecified: Secondary | ICD-10-CM | POA: Diagnosis not present

## 2021-02-17 DIAGNOSIS — E876 Hypokalemia: Secondary | ICD-10-CM | POA: Diagnosis not present

## 2021-02-17 DIAGNOSIS — N186 End stage renal disease: Secondary | ICD-10-CM | POA: Diagnosis not present

## 2021-02-17 DIAGNOSIS — N2581 Secondary hyperparathyroidism of renal origin: Secondary | ICD-10-CM | POA: Diagnosis not present

## 2021-02-17 DIAGNOSIS — E113213 Type 2 diabetes mellitus with mild nonproliferative diabetic retinopathy with macular edema, bilateral: Secondary | ICD-10-CM | POA: Diagnosis not present

## 2021-02-17 DIAGNOSIS — D631 Anemia in chronic kidney disease: Secondary | ICD-10-CM | POA: Diagnosis not present

## 2021-02-18 DIAGNOSIS — Z992 Dependence on renal dialysis: Secondary | ICD-10-CM | POA: Diagnosis not present

## 2021-02-18 DIAGNOSIS — N186 End stage renal disease: Secondary | ICD-10-CM | POA: Diagnosis not present

## 2021-02-18 DIAGNOSIS — E1122 Type 2 diabetes mellitus with diabetic chronic kidney disease: Secondary | ICD-10-CM | POA: Diagnosis not present

## 2021-02-19 DIAGNOSIS — Z23 Encounter for immunization: Secondary | ICD-10-CM | POA: Diagnosis not present

## 2021-02-19 DIAGNOSIS — E876 Hypokalemia: Secondary | ICD-10-CM | POA: Diagnosis not present

## 2021-02-19 DIAGNOSIS — D509 Iron deficiency anemia, unspecified: Secondary | ICD-10-CM | POA: Diagnosis not present

## 2021-02-19 DIAGNOSIS — D631 Anemia in chronic kidney disease: Secondary | ICD-10-CM | POA: Diagnosis not present

## 2021-02-19 DIAGNOSIS — Z992 Dependence on renal dialysis: Secondary | ICD-10-CM | POA: Diagnosis not present

## 2021-02-19 DIAGNOSIS — E1122 Type 2 diabetes mellitus with diabetic chronic kidney disease: Secondary | ICD-10-CM | POA: Diagnosis not present

## 2021-02-19 DIAGNOSIS — N2581 Secondary hyperparathyroidism of renal origin: Secondary | ICD-10-CM | POA: Diagnosis not present

## 2021-02-19 DIAGNOSIS — N186 End stage renal disease: Secondary | ICD-10-CM | POA: Diagnosis not present

## 2021-02-22 ENCOUNTER — Telehealth: Payer: Self-pay

## 2021-02-22 ENCOUNTER — Other Ambulatory Visit: Payer: Self-pay

## 2021-02-22 ENCOUNTER — Encounter: Payer: Self-pay | Admitting: Internal Medicine

## 2021-02-22 ENCOUNTER — Ambulatory Visit (INDEPENDENT_AMBULATORY_CARE_PROVIDER_SITE_OTHER): Payer: Medicare Other | Admitting: Internal Medicine

## 2021-02-22 VITALS — BP 120/62 | HR 72 | Ht 67.0 in | Wt 231.0 lb

## 2021-02-22 DIAGNOSIS — Z86010 Personal history of colonic polyps: Secondary | ICD-10-CM

## 2021-02-22 DIAGNOSIS — D631 Anemia in chronic kidney disease: Secondary | ICD-10-CM | POA: Diagnosis not present

## 2021-02-22 DIAGNOSIS — N2581 Secondary hyperparathyroidism of renal origin: Secondary | ICD-10-CM | POA: Diagnosis not present

## 2021-02-22 DIAGNOSIS — Z7901 Long term (current) use of anticoagulants: Secondary | ICD-10-CM

## 2021-02-22 DIAGNOSIS — I2510 Atherosclerotic heart disease of native coronary artery without angina pectoris: Secondary | ICD-10-CM

## 2021-02-22 DIAGNOSIS — I482 Chronic atrial fibrillation, unspecified: Secondary | ICD-10-CM

## 2021-02-22 DIAGNOSIS — D509 Iron deficiency anemia, unspecified: Secondary | ICD-10-CM | POA: Diagnosis not present

## 2021-02-22 DIAGNOSIS — N186 End stage renal disease: Secondary | ICD-10-CM | POA: Diagnosis not present

## 2021-02-22 DIAGNOSIS — E876 Hypokalemia: Secondary | ICD-10-CM | POA: Diagnosis not present

## 2021-02-22 DIAGNOSIS — Z992 Dependence on renal dialysis: Secondary | ICD-10-CM | POA: Diagnosis not present

## 2021-02-22 DIAGNOSIS — R195 Other fecal abnormalities: Secondary | ICD-10-CM | POA: Diagnosis not present

## 2021-02-22 MED ORDER — PEG-KCL-NACL-NASULF-NA ASC-C 100 G PO SOLR: 1.0000 | ORAL | 0 refills | Status: DC

## 2021-02-22 NOTE — Telephone Encounter (Signed)
   Patient Name: Louis [name]  DOB: [birth year]  MRN: [id number]   Primary Cardiologist: [name], MD  Chart reviewed as part of pre-operative protocol coverage. Patient has colonosco py scheduled for 03/02/2021 and we were asked to give our recommendations for holding Coumadin. Per Pharmacy and office protocol, "patient can hold Warfarin for 5 days prior to procedure.  Patient will NOT need bridging with Lovenox (enoxaparin) around procedure."  I will route this recommendation to the requesting party via [facility] fax function and remove from pre-op pool.  Please call with questions.  Darreld Mclean, PA-C 02/22/2021, 5:10 PM

## 2021-02-22 NOTE — Patient Instructions (Signed)
You have been scheduled for a colonoscopy. Please follow written instructions given to you at your visit today.  Please pick up your prep supplies at the pharmacy within the next 1-3 days. If you use inhalers (even only as needed), please bring them with you on the day of your procedure.  You will be contaced by our office prior to your procedure for directions on holding your Coumadin/Warfarin.  If you do not hear from our office 1 week prior to your scheduled procedure, please call [phone] to discuss.   I appreciate the opportunity to care for you. Carl Gessner, MD, FACG 

## 2021-02-22 NOTE — Progress Notes (Signed)
Louis [name] 75 y.o. [birth year] [id number]  Assessment & Plan:   Encounter Diagnoses  Name Primary?  . Change in stool caliber Yes  . Hx of adenomatous colonic polyps   . Warfarin anticoagulation   . Chronic atrial fibrillation (HCC)     Schedule colonoscopy to evaluate.  Hold warfarin anticoagulation 5 days prior to procedure and clarify with cardiology on this.  I did explain the rare but real extra procedure risk of stroke off his warfarin but we need to mitigate the bleeding risk with colonoscopy and possible polypectomy.  Diverticulosis hemorrhoids and colorectal neoplasia are in the differential as a cause of his change in stool caliber.   The risks and benefits as well as alternatives of endoscopic procedure(s) have been discussed and reviewed. All questions answered. The patient agrees to proceed.  I appreciate the opportunity to care for this patient. CC: [name], MD    Subjective:   Chief Complaint: Change in stool caliber  HPI Louis [name] is here with his wife because of a change in bowel habit.  I had seen him almost a year ago and he decided not to pursue a surveillance colonoscopy given his age and comorbidities.  Last time he had adenomas removed was in March 2018 he had 3 adenomas maximum 10 mm.  He was on home dialysis and unfortunately had to change to hemodialysis in a center Tuesdays Thursdays and Saturdays because his wife was diagnosed with metastatic breast cancer.  She is better but she cannot support home dialysis.  She messaged me for him through my chart that he has been having some pencil thin stools and some flat stools.  No bleeding.  He takes warfarin for A. fib, and he has some congestive heart failure issues as well.  He has not had any rectal bleeding and he moves his bowels most days.  She has been using MiraLAX about 3 days a week but he does not want to use it on dialysis days or prior to that to where he would have to defecate while  hooked up to the machine.  Situation was further complicated by a 32 ounce fluid restriction each day.  He has severe neuropathy which limits his mobility though he can transfer to a stretcher I think. Allergies  Allergen Reactions  . Hydrocodone Nausea And Vomiting    Other reaction(s): GI Upset (intolerance) Projectile vomiting   . Oxycodone Nausea And Vomiting    Other reaction(s): GI Upset (intolerance), Vomiting (intolerance) Projectile vomiting   . Dacarbazine Other (See Comments)    Unknown reaction  . Tape Dermatitis, Itching and Rash    Patch used at dialysis   Current Meds  Medication Sig  . allopurinol (ZYLOPRIM) 100 MG tablet TAKE 1 TO 1 & 1/2 (ONE & ONE-HALF) TABLETS BY MOUTH TWICE DAILY (Patient taking differently: Take 100 mg by mouth daily.)  . AMBULATORY NON FORMULARY MEDICATION Continuous positive airway pressure (CPAP) device: Auto titrate minimum 4 cm H20 to maximum of 20 cm H2O with pressure. Please provide all supplemental supplies as needed. Fax to: [phone]  . AMBULATORY NON FORMULARY MEDICATION CPAP supplies - Disposable filters (white), Replaceable pillow or cushions, Mask and Tubing as needed.  Marland Kitchen amLODipine (NORVASC) 2.5 MG tablet Take 1 tablet (2.5 mg total) by mouth daily.  . B-D INS SYR ULTRAFINE 1CC/31G 31G X 5/16" 1 ML MISC   . blood glucose meter kit and supplies Use to check blood sugars daily E11.65  . Cholecalciferol (VITAMIN  D3) 50 MCG (2000 UT) TABS Take 2,000 Units by mouth daily.   . Coenzyme Q10 (COQ10) 200 MG CAPS Take 200 mg by mouth daily.  . Continuous Blood Gluc Receiver (FREESTYLE LIBRE 14 DAY READER) DEVI Dx DM E11.22 Check blood sugar 4 times daily.  . Continuous Blood Gluc Sensor (FREESTYLE LIBRE 14 DAY SENSOR) MISC Dx DM E11.22 Check blood sugar 4 times daily.  Marland Kitchen doxercalciferol (HECTOROL) 4 MCG/2ML injection Doxercalciferol (Hectorol)  . furosemide (LASIX) 80 MG tablet Take 2 tablets (160 mg total) by mouth 2 (two) times daily.  Morning & after lunch  . hydrALAZINE (APRESOLINE) 100 MG tablet Take 1 tablet (100 mg total) by mouth 3 (three) times daily.  . isosorbide dinitrate (ISORDIL) 20 MG tablet Take 1 tablet (20 mg total) by mouth 2 (two) times daily.  Marland Kitchen lidocaine-prilocaine (EMLA) cream SMARTSIG:Sparingly Topical  . Methoxy PEG-Epoetin Beta (MIRCERA IJ) Mircera  . nitroGLYCERIN (NITROSTAT) 0.4 MG SL tablet Place 1 tablet (0.4 mg total) under the tongue every 5 (five) minutes as needed for chest pain.  Marland Kitchen NOVOLIN N 100 UNIT/ML injection Inject 20-30 Units into the skin See admin instructions. Inject 30 units subcutaneously in the morning & inject 20-30 units subcutaneously at bedtime (depending on blood sugar)  . NOVOLIN R 100 UNIT/ML injection Inject 8-20 Units into the skin 3 (three) times daily with meals. Sliding Scale  . omega-3 acid ethyl esters (LOVAZA) 1 g capsule Take 2 g by mouth daily.   Glory Rosebush ULTRA test strip USE 1 STRIP TO CHECK GLUCOSE 3 TIMES DAILY. E11.22  . peg 3350 powder (MOVIPREP) 100 g SOLR Take 1 kit (200 g total) by mouth as directed.  . Probiotic Product (PROBIOTIC DAILY PO) Take 420 mg by mouth daily.  . rosuvastatin (CRESTOR) 40 MG tablet Take 1 tablet by mouth once daily  . triamcinolone ointment (KENALOG) 0.5 % Apply 1 application topically 2 (two) times daily. To affected area, avoid eyes and face  . Vitamin Mixture (VITAMIN E COMPLETE PO) Take 1 capsule by mouth daily. Vitamin E complex  . warfarin (COUMADIN) 5 MG tablet TAKE 1 TABLET BY MOUTH ONCE DAILY AT  6PM  MONITOR  INR  GOAL  SHOULD  BE  BETWEEN  2-3  . ZINC SULFATE PO Take 1 tablet by mouth daily.    Past Medical History:  Diagnosis Date  . Arthritis   . Brittle bones    per pt, has soft bones in right foot/wears boot cast!  . CAD (coronary artery disease)   . Cancer ([place])    skin cancer on arm  . Cataract    Bil/ surg scheduled for right eye 01/18/17/ left eye 02/08/17  . Charcot ankle, right 2019  . CHF (congestive  heart failure) ([place]) 2015  . Diabetes mellitus    Type 2  . Heart failure, diastolic ([place])   . History of kidney stones   . Hyperlipidemia   . Hypertension   . Macular degeneration disease   . Macular edema 2014  . OSA on CPAP   . Paroxysmal atrial fibrillation (HCC)   . Personal history of colonic polyps - adenomas 01/28/2014  . Renal insufficiency    Tu[facility]Th/[facility] Dialysis  . Shortness of breath dyspnea   . Syncope and collapse    Past Surgical History:  Procedure Laterality Date  . AV FISTULA PLACEMENT Left 09/01/2020   Procedure: LEFT ARM ARTERIOVENOUS (AV) FISTULA;  Surgeon: Waynetta Sandy, MD;  Location: [facility];  Service: Vascular;  Laterality: Left;  . BASCILIC VEIN TRANSPOSITION Left 10/27/2020   Procedure: LEFT SECOND STAGE BASILIC VEIN FISTULA TRANSPOSITION;  Surgeon: Waynetta Sandy, MD;  Location: [place] [place];  Service: Vascular;  Laterality: Left;  . CARPAL TUNNEL RELEASE     left hand  . COLONOSCOPY    . INTRAVASCULAR PRESSURE WIRE/FFR STUDY N/A 12/18/2018   Procedure: INTRAVASCULAR PRESSURE WIRE/FFR STUDY;  Surgeon: Wellington Hampshire, MD;  Location: [facility];  Service: Cardiovascular;  Laterality: N/A;  . IR FLUORO GUIDE CV LINE RIGHT  12/16/2018  . IR US GUIDE VASC ACCESS RIGHT  12/16/2018  . KNEE ARTHROSCOPY Right 09/13/2016   [facility], Dr. Dorna Leitz  . PILONIDAL CYST EXCISION    . RIGHT/LEFT HEART CATH AND CORONARY ANGIOGRAPHY N/A 12/18/2018   Procedure: RIGHT/LEFT HEART CATH AND CORONARY ANGIOGRAPHY;  Surgeon: Wellington Hampshire, MD;  Location: [facility];  Service: Cardiovascular;  Laterality: N/A;   Social History   Social History Narrative   Married wife has metastatic breast cancer   Retired Proofreader work   5 children   Former smoker no alcohol tobacco or drug use   Was very active with his church group is a coffee drinker as well   family history includes Cancer in his paternal grandmother; Coronary artery disease  in his father; Diabetes in his brother, father, maternal grandmother, mother, and sister; Heart attack in his paternal grandfather; Kidney disease in his mother; Prostate cancer in his maternal uncle.   Review of Systems As per HPI  Objective:   Physical Exam BP 120/62   Pulse 72   Ht 5' 7" (1.702 m)   Wt 231 lb (104.8 kg)   SpO2 99%   BMI 36.18 kg/m  Obese elderly white man no acute distress Lungs are clear Heart sounds are normal S1-S2 I hear an irregular rhythm with persistent A. fib no murmur Abdomen is obese There is a dialysis fistula in the left upper extremity He is alert and oriented x3

## 2021-02-22 NOTE — Telephone Encounter (Signed)
Pharmacy, can you please comment on how long Coumadin can be held for upcoming colonoscopy?  Thank you! 

## 2021-02-22 NOTE — Telephone Encounter (Signed)
Patient with diagnosis of afib on warfarin for anticoagulation.    Procedure: colonoscopy Date of procedure: 03/02/21  CHA2DS2-VASc Score = 6  This indicates a 9.7% annual risk of stroke. The patient's score is based upon: CHF History: Yes HTN History: Yes Diabetes History: Yes Stroke History: No Vascular Disease History: Yes Age Score: 2 Gender Score: 0     Per office protocol, patient can hold warfarin for 5 days prior to procedure.   Patient will NOT need bridging with Lovenox (enoxaparin) around procedure.

## 2021-02-22 NOTE — Telephone Encounter (Signed)
Request for surgical clearance:     Endoscopy Procedure  What type of surgery is being performed? Colonoscopy   When is this surgery scheduled?  03/02/21  What type of clearance is required ?   Pharmacy  Are there any medications that need to be held prior to surgery and how long? Warfarin x5 days prior to procedure   Practice name and name of physician performing surgery?      [facility]-Dr. Carlean Purl   What is your office phone and fax number?      Phone- [phone]  Fax816 [phone]  Anesthesia type (None, local, MAC, general) ?       MAC

## 2021-02-23 ENCOUNTER — Ambulatory Visit: Payer: Medicare Other

## 2021-02-23 NOTE — Telephone Encounter (Signed)
I spoke with Louis [name] and informed them that we got approval to hold the warfarin for 5 days. They verbalized understanding.

## 2021-02-24 DIAGNOSIS — D631 Anemia in chronic kidney disease: Secondary | ICD-10-CM | POA: Diagnosis not present

## 2021-02-24 DIAGNOSIS — N186 End stage renal disease: Secondary | ICD-10-CM | POA: Diagnosis not present

## 2021-02-24 DIAGNOSIS — D509 Iron deficiency anemia, unspecified: Secondary | ICD-10-CM | POA: Diagnosis not present

## 2021-02-24 DIAGNOSIS — E876 Hypokalemia: Secondary | ICD-10-CM | POA: Diagnosis not present

## 2021-02-24 DIAGNOSIS — Z992 Dependence on renal dialysis: Secondary | ICD-10-CM | POA: Diagnosis not present

## 2021-02-24 DIAGNOSIS — N2581 Secondary hyperparathyroidism of renal origin: Secondary | ICD-10-CM | POA: Diagnosis not present

## 2021-02-26 DIAGNOSIS — D509 Iron deficiency anemia, unspecified: Secondary | ICD-10-CM | POA: Diagnosis not present

## 2021-02-26 DIAGNOSIS — D631 Anemia in chronic kidney disease: Secondary | ICD-10-CM | POA: Diagnosis not present

## 2021-02-26 DIAGNOSIS — E876 Hypokalemia: Secondary | ICD-10-CM | POA: Diagnosis not present

## 2021-02-26 DIAGNOSIS — Z992 Dependence on renal dialysis: Secondary | ICD-10-CM | POA: Diagnosis not present

## 2021-02-26 DIAGNOSIS — N186 End stage renal disease: Secondary | ICD-10-CM | POA: Diagnosis not present

## 2021-02-26 DIAGNOSIS — N2581 Secondary hyperparathyroidism of renal origin: Secondary | ICD-10-CM | POA: Diagnosis not present

## 2021-02-28 ENCOUNTER — Telehealth: Payer: Self-pay

## 2021-02-28 MED ORDER — FLUCONAZOLE 200 MG PO TABS: 200.0000 mg | ORAL_TABLET | ORAL | 0 refills | Status: DC

## 2021-02-28 NOTE — Telephone Encounter (Signed)
Want to avoid using stronger steroids in the groin region to prevent skin thinning. Sending in oral Diflucan 200mg  once weekly for 4 weeks. Please monitor INR closely during the treatment and watch for bleeding issues.

## 2021-02-28 NOTE — Telephone Encounter (Signed)
Pt was seen on 02/16/21 and was dx with jock itch. [name] called and LVM stating that he has been using the clotrimazole 1% and triamcinolone 0.5% creams and it is not getting any better. They are wanting to know if there is anything stronger that can be sent in to his pharmacy.

## 2021-02-28 NOTE — Telephone Encounter (Signed)
Left message advising of recommendations.  

## 2021-03-01 DIAGNOSIS — N186 End stage renal disease: Secondary | ICD-10-CM | POA: Diagnosis not present

## 2021-03-01 DIAGNOSIS — N2581 Secondary hyperparathyroidism of renal origin: Secondary | ICD-10-CM | POA: Diagnosis not present

## 2021-03-01 DIAGNOSIS — D631 Anemia in chronic kidney disease: Secondary | ICD-10-CM | POA: Diagnosis not present

## 2021-03-01 DIAGNOSIS — E876 Hypokalemia: Secondary | ICD-10-CM | POA: Diagnosis not present

## 2021-03-01 DIAGNOSIS — D509 Iron deficiency anemia, unspecified: Secondary | ICD-10-CM | POA: Diagnosis not present

## 2021-03-01 DIAGNOSIS — Z992 Dependence on renal dialysis: Secondary | ICD-10-CM | POA: Diagnosis not present

## 2021-03-02 ENCOUNTER — Encounter: Payer: Self-pay | Admitting: Internal Medicine

## 2021-03-02 ENCOUNTER — Other Ambulatory Visit: Payer: Self-pay

## 2021-03-02 ENCOUNTER — Ambulatory Visit (AMBULATORY_SURGERY_CENTER): Payer: Medicare Other | Admitting: Internal Medicine

## 2021-03-02 VITALS — BP 127/65 | HR 63 | Temp 98.6°F | Resp 16 | Ht 67.0 in | Wt 231.0 lb

## 2021-03-02 DIAGNOSIS — R195 Other fecal abnormalities: Secondary | ICD-10-CM

## 2021-03-02 DIAGNOSIS — K5730 Diverticulosis of large intestine without perforation or abscess without bleeding: Secondary | ICD-10-CM | POA: Diagnosis not present

## 2021-03-02 DIAGNOSIS — Z86010 Personal history of colonic polyps: Secondary | ICD-10-CM

## 2021-03-02 DIAGNOSIS — K648 Other hemorrhoids: Secondary | ICD-10-CM | POA: Diagnosis not present

## 2021-03-02 DIAGNOSIS — Z1211 Encounter for screening for malignant neoplasm of colon: Secondary | ICD-10-CM | POA: Diagnosis not present

## 2021-03-02 MED ORDER — SODIUM CHLORIDE 0.9 % IV SOLN: 500.0000 mL | Freq: Once | INTRAVENOUS | Status: DC

## 2021-03-02 NOTE — Op Note (Signed)
[facility] Patient Name: Louis [name] Procedure Date: 03/02/2021 11:42 AM MRN: [id number] Endoscopist: Gatha Mayer , MD Age: 75 Referring MD:  Date of Birth: 08/17/1946 Gender: Male Account #: [id number] Procedure:                Colonoscopy Indications:              Change in stool caliber Medicines:                Propofol per Anesthesia, Monitored Anesthesia Care Procedure:                Pre-Anesthesia Assessment:                           - Prior to the procedure, a History and Physical                            was performed, and patient medications and                            allergies were reviewed. The patient's tolerance of                            previous anesthesia was also reviewed. The risks                            and benefits of the procedure and the sedation                            options and risks were discussed with the patient.                            All questions were answered, and informed consent                            was obtained. Prior Anticoagulants: The patient                            last took Coumadin (warfarin) 5 days prior to the                            procedure. ASA Grade Assessment: III - A patient                            with severe systemic disease. After reviewing the                            risks and benefits, the patient was deemed in                            satisfactory condition to undergo the procedure.                           After obtaining informed consent, the colonoscope  was passed under direct vision. Throughout the                            procedure, the patient's blood pressure, pulse, and                            oxygen saturations were monitored continuously. The                            Olympus CF-HQ190L [id number]) Colonoscope was                            introduced through the anus and advanced to the the                            cecum,  identified by appendiceal orifice and                            ileocecal valve. The colonoscopy was performed                            without difficulty. The patient tolerated the                            procedure well. The quality of the bowel                            preparation was good. The ileocecal valve,                            appendiceal orifice, and rectum were photographed.                            The bowel preparation used was MoviPrep via split                            dose instruction. Scope In: 11:56:24 AM Scope Out: 12:04:10 PM Scope Withdrawal Time: 0 hours 5 minutes 43 seconds  Total Procedure Duration: 0 hours 7 minutes 46 seconds  Findings:                 The perianal and digital rectal examinations were                            normal. Pertinent negatives include normal prostate                            (size, shape, and consistency).                           Multiple diverticula were found in the sigmoid                            colon and descending colon. There was narrowing of  the colon in association with the diverticular                            opening.                           Internal hemorrhoids were found.                           The exam was otherwise without abnormality on                            direct and retroflexion views. Complications:            No immediate complications. Estimated Blood Loss:     Estimated blood loss: none. Impression:               - Severe diverticulosis in the sigmoid colon and in                            the descending colon. There was narrowing of the                            colon in association with the diverticular opening.                           - Internal hemorrhoids.                           - The examination was otherwise normal on direct                            and retroflexion views.                           - No specimens  collected. Recommendation:           - Patient has a contact number available for                            emergencies. The signs and symptoms of potential                            delayed complications were discussed with the                            patient. Return to normal activities tomorrow.                            Written discharge instructions were provided to the                            patient.                           - Resume previous diet.                           -  Continue present medications.                           - Resume Coumadin (warfarin) at prior dose today. Gatha Mayer, MD 03/02/2021 12:11:33 PM This report has been signed electronically.

## 2021-03-02 NOTE — Patient Instructions (Addendum)
No polyps or cancer.  You do have diverticulosis - thickened muscle rings and pouches in the colon wall. It has narrowed the colon some and is responsible for the change in stool size.  Please read the handout about this condition.  Restart warfarin tonight. Unless told otherwise I recommend you take 2 tablets tonight and then resume regular dosing schedule.  I appreciate the opportunity to care for you. Gatha Mayer, MD, [facility]  Information on hemorrhoids and diverticulosis given to you today.  Resume Coumadin (Warafin) at prior dose today.  Resume previous diet and medications.  YOU HAD AN ENDOSCOPIC PROCEDURE TODAY AT [facility]:   Refer to the procedure report that was given to you for any specific questions about what was found during the examination.  If the procedure report does not answer your questions, please call your gastroenterologist to clarify.  If you requested that your care partner not be given the details of your procedure findings, then the procedure report has been included in a sealed envelope for you to review at your convenience later.  YOU SHOULD EXPECT: Some feelings of bloating in the abdomen. Passage of more gas than usual.  Walking can help get rid of the air that was put into your GI tract during the procedure and reduce the bloating. If you had a lower endoscopy (such as a colonoscopy or flexible sigmoidoscopy) you may notice spotting of blood in your stool or on the toilet paper. If you underwent a bowel prep for your procedure, you may not have a normal bowel movement for a few days.  Please Note:  You might notice some irritation and congestion in your nose or some drainage.  This is from the oxygen used during your procedure.  There is no need for concern and it should clear up in a day or so.  SYMPTOMS TO REPORT IMMEDIATELY:   Following lower endoscopy (colonoscopy or flexible sigmoidoscopy):  Excessive amounts of blood in the  stool  Significant tenderness or worsening of abdominal pains  Swelling of the abdomen that is new, acute  Fever of 100F or higher   For urgent or emergent issues, a gastroenterologist can be reached at any hour by calling [phone]. Do not use MyChart messaging for urgent concerns.    DIET:  We do recommend a small meal at first, but then you may proceed to your regular diet.  Drink plenty of fluids but you should avoid alcoholic beverages for 24 hours.  ACTIVITY:  You should plan to take it easy for the rest of today and you should NOT DRIVE or use heavy machinery until tomorrow (because of the sedation medicines used during the test).    FOLLOW UP: Our staff will call the number listed on your records 48-72 hours following your procedure to check on you and address any questions or concerns that you may have regarding the information given to you following your procedure. If we do not reach you, we will leave a message.  We will attempt to reach you two times.  During this call, we will ask if you have developed any symptoms of COVID 19. If you develop any symptoms (ie: fever, flu-like symptoms, shortness of breath, cough etc.) before then, please call [phone].  If you test positive for Covid 19 in the 2 weeks post procedure, please call and report this information to [place].    If any biopsies were taken you will be contacted by phone or by letter  within the next 1-3 weeks.  Please call us at [phone] if you have not heard about the biopsies in 3 weeks.    SIGNATURES/CONFIDENTIALITY: You and/or your care partner have signed paperwork which will be entered into your electronic medical record.  These signatures attest to the fact that that the information above on your After Visit Summary has been reviewed and is understood.  Full responsibility of the confidentiality of this discharge information lies with you and/or your care-partner.

## 2021-03-02 NOTE — Progress Notes (Signed)
Vitals-CW  History reviewed. Wife in attendance.

## 2021-03-02 NOTE — Progress Notes (Signed)
A and O x3. Report to RN. Tolerated MAC anesthesia well.

## 2021-03-03 DIAGNOSIS — Z992 Dependence on renal dialysis: Secondary | ICD-10-CM | POA: Diagnosis not present

## 2021-03-03 DIAGNOSIS — D631 Anemia in chronic kidney disease: Secondary | ICD-10-CM | POA: Diagnosis not present

## 2021-03-03 DIAGNOSIS — N186 End stage renal disease: Secondary | ICD-10-CM | POA: Diagnosis not present

## 2021-03-03 DIAGNOSIS — N2581 Secondary hyperparathyroidism of renal origin: Secondary | ICD-10-CM | POA: Diagnosis not present

## 2021-03-03 DIAGNOSIS — D509 Iron deficiency anemia, unspecified: Secondary | ICD-10-CM | POA: Diagnosis not present

## 2021-03-03 DIAGNOSIS — E876 Hypokalemia: Secondary | ICD-10-CM | POA: Diagnosis not present

## 2021-03-05 DIAGNOSIS — N2581 Secondary hyperparathyroidism of renal origin: Secondary | ICD-10-CM | POA: Diagnosis not present

## 2021-03-05 DIAGNOSIS — D631 Anemia in chronic kidney disease: Secondary | ICD-10-CM | POA: Diagnosis not present

## 2021-03-05 DIAGNOSIS — N186 End stage renal disease: Secondary | ICD-10-CM | POA: Diagnosis not present

## 2021-03-05 DIAGNOSIS — E876 Hypokalemia: Secondary | ICD-10-CM | POA: Diagnosis not present

## 2021-03-05 DIAGNOSIS — D509 Iron deficiency anemia, unspecified: Secondary | ICD-10-CM | POA: Diagnosis not present

## 2021-03-05 DIAGNOSIS — Z992 Dependence on renal dialysis: Secondary | ICD-10-CM | POA: Diagnosis not present

## 2021-03-07 ENCOUNTER — Ambulatory Visit (INDEPENDENT_AMBULATORY_CARE_PROVIDER_SITE_OTHER): Payer: Medicare Other

## 2021-03-07 ENCOUNTER — Ambulatory Visit (INDEPENDENT_AMBULATORY_CARE_PROVIDER_SITE_OTHER): Payer: Medicare Other | Admitting: Sports Medicine

## 2021-03-07 ENCOUNTER — Telehealth: Payer: Self-pay

## 2021-03-07 ENCOUNTER — Other Ambulatory Visit: Payer: Self-pay

## 2021-03-07 DIAGNOSIS — S42295A Other nondisplaced fracture of upper end of left humerus, initial encounter for closed fracture: Secondary | ICD-10-CM

## 2021-03-07 DIAGNOSIS — S42202A Unspecified fracture of upper end of left humerus, initial encounter for closed fracture: Secondary | ICD-10-CM | POA: Diagnosis not present

## 2021-03-07 DIAGNOSIS — S42292A Other displaced fracture of upper end of left humerus, initial encounter for closed fracture: Secondary | ICD-10-CM | POA: Diagnosis not present

## 2021-03-07 DIAGNOSIS — S46012A Strain of muscle(s) and tendon(s) of the rotator cuff of left shoulder, initial encounter: Secondary | ICD-10-CM

## 2021-03-07 DIAGNOSIS — M25512 Pain in left shoulder: Secondary | ICD-10-CM | POA: Diagnosis not present

## 2021-03-07 DIAGNOSIS — I2510 Atherosclerotic heart disease of native coronary artery without angina pectoris: Secondary | ICD-10-CM

## 2021-03-07 NOTE — Progress Notes (Addendum)
    Procedures performed today:    None.  Independent interpretation of notes and tests performed by another provider:   None.  Brief History, Exam, Impression, and Recommendations:    Fracture of humerus, left, closed Louis [name] returns, he is a pleasant 75 year old male, unfortunately a couple weeks ago he had a fall onto his left shoulder, subsequently he was unable to abduct and had persistent pain. On exam he has very little motion, significant weakness to abduction, as well as impingement signs such as a positive Neer's, Hawkins, empty can signs. He has a positive drop arm sign. I am concerned that he has a full-thickness retracted supraspinatus tear. Adding x-rays, MRI should be done today, he has some tramadol at home that he can use as needed for pain. I think if we do find a cuff tear we may consider an injection prior to surgical intervention considering his cardiovascular baseline.  Update: MRI shows proximal humeral fracture with intact rotator cuff, this will take 6 to 8 weeks to heal, he can start PT in about 2 weeks. I like to see him back in 2 weeks before he starts physical therapy.    ___________________________________________ Gwen Her. Dianah Field, M.D., ABFM., CAQSM. Primary Care and [place] Instructor of [employer] [place] [employer] [facility] [employer]

## 2021-03-07 NOTE — Telephone Encounter (Signed)
LVM

## 2021-03-07 NOTE — Assessment & Plan Note (Addendum)
Louis [name] returns, he is a pleasant 75 year old male, unfortunately a couple weeks ago he had a fall onto his left shoulder, subsequently he was unable to abduct and had persistent pain. On exam he has very little motion, significant weakness to abduction, as well as impingement signs such as a positive Neer's, Hawkins, empty can signs. He has a positive drop arm sign. I am concerned that he has a full-thickness retracted supraspinatus tear. Adding x-rays, MRI should be done today, he has some tramadol at home that he can use as needed for pain. I think if we do find a cuff tear we may consider an injection prior to surgical intervention considering his cardiovascular baseline.  Update: MRI shows proximal humeral fracture with intact rotator cuff, this will take 6 to 8 weeks to heal, he can start PT in about 2 weeks. I like to see him back in 2 weeks before he starts physical therapy.

## 2021-03-08 DIAGNOSIS — E876 Hypokalemia: Secondary | ICD-10-CM | POA: Diagnosis not present

## 2021-03-08 DIAGNOSIS — Z992 Dependence on renal dialysis: Secondary | ICD-10-CM | POA: Diagnosis not present

## 2021-03-08 DIAGNOSIS — N2581 Secondary hyperparathyroidism of renal origin: Secondary | ICD-10-CM | POA: Diagnosis not present

## 2021-03-08 DIAGNOSIS — D631 Anemia in chronic kidney disease: Secondary | ICD-10-CM | POA: Diagnosis not present

## 2021-03-08 DIAGNOSIS — N186 End stage renal disease: Secondary | ICD-10-CM | POA: Diagnosis not present

## 2021-03-08 DIAGNOSIS — D509 Iron deficiency anemia, unspecified: Secondary | ICD-10-CM | POA: Diagnosis not present

## 2021-03-10 ENCOUNTER — Telehealth: Payer: Self-pay | Admitting: Cardiology

## 2021-03-10 DIAGNOSIS — E876 Hypokalemia: Secondary | ICD-10-CM | POA: Diagnosis not present

## 2021-03-10 DIAGNOSIS — D631 Anemia in chronic kidney disease: Secondary | ICD-10-CM | POA: Diagnosis not present

## 2021-03-10 DIAGNOSIS — D509 Iron deficiency anemia, unspecified: Secondary | ICD-10-CM | POA: Diagnosis not present

## 2021-03-10 DIAGNOSIS — Z992 Dependence on renal dialysis: Secondary | ICD-10-CM | POA: Diagnosis not present

## 2021-03-10 DIAGNOSIS — E1122 Type 2 diabetes mellitus with diabetic chronic kidney disease: Secondary | ICD-10-CM | POA: Diagnosis not present

## 2021-03-10 DIAGNOSIS — N186 End stage renal disease: Secondary | ICD-10-CM | POA: Diagnosis not present

## 2021-03-10 DIAGNOSIS — N2581 Secondary hyperparathyroidism of renal origin: Secondary | ICD-10-CM | POA: Diagnosis not present

## 2021-03-10 NOTE — Telephone Encounter (Signed)
    Pt c/o medication issue:  1. Name of Medication: warfarin (COUMADIN) 5 MG tablet  2. How are you currently taking this medication (dosage and times per day)? TAKE 1 TABLET BY MOUTH ONCE DAILY AT 6PM MONITOR INR GOAL SHOULD BE BETWEEN 2-3  3. Are you having a reaction (difficulty breathing--STAT)?   4. What is your medication issue? [name] calling, she said pt's pcp prescribed him Ciprofloxacin 500 mg to take 1 tablet a day for his UTI, but, pharmacy said this is not good to take with warfarin and advised to call Dr. Stanford Breed

## 2021-03-10 NOTE — Telephone Encounter (Signed)
Returned call to wife (ok per DPR) advised of recommendations.  She will contact PCP who manages coumadin to discuss

## 2021-03-10 NOTE — Telephone Encounter (Signed)
We do NOT manage his warfarin.  Patient needs to contact current anticoagulation management clinic.  Okay to take ciprofloxacin as prescribed, but will need INR in 3-4 days.

## 2021-03-11 ENCOUNTER — Other Ambulatory Visit: Payer: Self-pay

## 2021-03-11 ENCOUNTER — Ambulatory Visit (INDEPENDENT_AMBULATORY_CARE_PROVIDER_SITE_OTHER): Payer: Medicare Other | Admitting: Family Medicine

## 2021-03-11 VITALS — BP 141/53 | HR 82

## 2021-03-11 DIAGNOSIS — I4891 Unspecified atrial fibrillation: Secondary | ICD-10-CM | POA: Diagnosis not present

## 2021-03-11 LAB — POCT INR: INR: 2 (ref 2.0–3.0)

## 2021-03-11 NOTE — Progress Notes (Signed)
Patient reports taking Coumadin as follows:  2.5 mg on Tuesday and Friday   Louis [name] denies any missed doses of medication, extra doses, bleeding gums, nose bleeds, hospitalization, blood in urine, blood in stool, dental procedures, any bruising, abnormal bleeding, or medication changes.  He did start Cipro yesterday for a bladder infection.  INR was checked today with a reading of 2.0. He has an appointment on Monday for repeat due to being on Cipro. He will keep follow up appointment.

## 2021-03-11 NOTE — Progress Notes (Signed)
Agree with documentation as above.   Emileigh Kellett, MD  

## 2021-03-12 DIAGNOSIS — D631 Anemia in chronic kidney disease: Secondary | ICD-10-CM | POA: Diagnosis not present

## 2021-03-12 DIAGNOSIS — Z992 Dependence on renal dialysis: Secondary | ICD-10-CM | POA: Diagnosis not present

## 2021-03-12 DIAGNOSIS — N2581 Secondary hyperparathyroidism of renal origin: Secondary | ICD-10-CM | POA: Diagnosis not present

## 2021-03-12 DIAGNOSIS — D509 Iron deficiency anemia, unspecified: Secondary | ICD-10-CM | POA: Diagnosis not present

## 2021-03-12 DIAGNOSIS — N186 End stage renal disease: Secondary | ICD-10-CM | POA: Diagnosis not present

## 2021-03-12 DIAGNOSIS — E876 Hypokalemia: Secondary | ICD-10-CM | POA: Diagnosis not present

## 2021-03-14 ENCOUNTER — Other Ambulatory Visit: Payer: Self-pay

## 2021-03-14 ENCOUNTER — Ambulatory Visit (INDEPENDENT_AMBULATORY_CARE_PROVIDER_SITE_OTHER): Payer: Medicare Other | Admitting: Family Medicine

## 2021-03-14 VITALS — BP 137/60 | HR 86

## 2021-03-14 VITALS — BP 134/80 | HR 61 | Temp 96.4°F | Ht 67.0 in | Wt 235.4 lb

## 2021-03-14 DIAGNOSIS — Z0000 Encounter for general adult medical examination without abnormal findings: Secondary | ICD-10-CM | POA: Diagnosis not present

## 2021-03-14 DIAGNOSIS — I4891 Unspecified atrial fibrillation: Secondary | ICD-10-CM

## 2021-03-14 LAB — POCT INR: INR: 2.9 (ref 2.0–3.0)

## 2021-03-14 NOTE — Progress Notes (Signed)
See note

## 2021-03-14 NOTE — Progress Notes (Signed)
MEDICARE ANNUAL WELLNESS VISIT  03/14/2021  Telephone Visit Disclaimer This Medicare AWV was conducted by telephone due to national recommendations for restrictions regarding the COVID-19 Pandemic (e.g. social distancing).  I verified, using two identifiers, that I am speaking with Louis [name] or their authorized healthcare agent. I discussed the limitations, risks, security, and privacy concerns of performing an evaluation and management service by telephone and the potential availability of an in-person appointment in the future. The patient expressed understanding and agreed to proceed.  Location of Patient: Home Location of Provider (nurse):  In the office.  Subjective:    Louis [name] is a 75 y.o. male patient of Metheney, Rene Kocher, MD who had a Medicare Annual Wellness Visit today via telephone. Ibrohim is Retired and lives with their spouse and two of his grandchildren. he has 5 children. he reports that he is socially active and does interact with friends/family regularly. he is minimally physically active and enjoys watching sports and spending time with the grandchildren..  Patient Care Team: [name], MD as PCP - General Stanford Breed Denice Bors, MD as PCP - Cardiology (Cardiology) Lonna Duval, MD as Referring Physician (Endocrinology) Gerarda Fraction, MD as Referring Physician (Ophthalmology) Lyndee Hensen (Chiropractic Medicine) Justin Mend, MD as Consulting Physician (Nephrology) Center[facility]  Advanced Directives 03/14/2021 01/23/2019 12/14/2018 09/03/2018 02/02/2017 01/25/2017 12/12/2016  Does Patient Have a Medical Advance Directive? _0  No No  Type of Advance Directive - - - - - - -  Would patient like information on creating a medical advance directive? No - Patient declined No - Patient declined No - Patient declined Yes (MAU/Ambulatory/Procedural Areas - Information given) No - Patient declined - Yes (MAU/Ambulatory/Procedural  Areas - Information given)    Hospital Utilization Over the Past 12 Months: # of hospitalizations or ER visits: 2 # of surgeries: 1  Review of Systems    Patient reports that his overall health is better compared to last year.  History obtained from chart review and the patient  Patient Reported Readings (BP, Pulse, CBG, Weight, etc) Weight 235.4 lbs height 80f BP 134/80 Pulse 61 Temp 96.4  Pain Assessment Pain : No/denies pain     Current Medications & Allergies (verified) Allergies as of 03/14/2021      Reactions   Hydrocodone Nausea And Vomiting   Other reaction(s): GI Upset (intolerance) Projectile vomiting   Oxycodone Nausea And Vomiting   Other reaction(s): GI Upset (intolerance), Vomiting (intolerance) Projectile vomiting   Dacarbazine Other (See Comments)   Unknown reaction   Tape Dermatitis, Itching, Rash   Patch used at dialysis      Medication List       Accurate as of March 14, 2021  9:47 AM. If you have any questions, ask your nurse or doctor.        allopurinol 100 MG tablet Commonly known as: ZYLOPRIM TAKE 1 TO 1 & 1/2 (ONE & ONE-HALF) TABLETS BY MOUTH TWICE DAILY What changed: See the new instructions.   AMBULATORY NON FORMULARY MEDICATION Continuous positive airway pressure (CPAP) device: Auto titrate minimum 4 cm H20 to maximum of 20 cm H2O with pressure. Please provide all supplemental supplies as needed. Fax to: [phone]  AMBULATORY NON FORMULARY MEDICATION CPAP supplies - Disposable filters (white), Replaceable pillow or cushions, Mask and Tubing as needed.   amLODipine 2.5 MG tablet Commonly known as: NORVASC Take 1 tablet (2.5 mg total) by mouth daily.   B-D INS SYR  ULTRAFINE 1CC/31G 31G X 5/16" 1 ML Misc Generic drug: Insulin Syringe-Needle U-100   blood glucose meter kit and supplies Use to check blood sugars daily E11.65   ciprofloxacin 500 MG tablet Commonly known as: CIPRO Take 500 mg by mouth at bedtime.   CoQ10 200  MG Caps Take 200 mg by mouth daily.   doxercalciferol 4 MCG/2ML injection Commonly known as: HECTOROL Doxercalciferol (Hectorol)   fluconazole 200 MG tablet Commonly known as: DIFLUCAN Take 200 mg by mouth. Once a week.   FreeStyle Libre 14 Day Reader Kerrin Mo Dx DM E11.22 Check blood sugar 4 times daily.   FreeStyle Libre 14 Day Sensor Misc Dx DM E11.22 Check blood sugar 4 times daily.   furosemide 80 MG tablet Commonly known as: LASIX Take 2 tablets (160 mg total) by mouth 2 (two) times daily. Morning & after lunch   gabapentin 300 MG capsule Commonly known as: NEURONTIN Take 1 capsule by mouth daily.   hydrALAZINE 100 MG tablet Commonly known as: APRESOLINE Take 1 tablet (100 mg total) by mouth 3 (three) times daily.   isosorbide dinitrate 20 MG tablet Commonly known as: ISORDIL Take 1 tablet (20 mg total) by mouth 2 (two) times daily.   lidocaine-prilocaine cream Commonly known as: EMLA SMARTSIG:Sparingly Topical   MIRCERA IJ Mircera   nitroGLYCERIN 0.4 MG SL tablet Commonly known as: NITROSTAT Place 1 tablet (0.4 mg total) under the tongue every 5 (five) minutes as needed for chest pain.   NovoLIN N 100 UNIT/ML injection Generic drug: insulin NPH Human Inject 20-30 Units into the skin See admin instructions. Inject 30 units subcutaneously in the morning & inject 20-30 units subcutaneously at bedtime (depending on blood sugar)   NovoLIN R 100 units/mL injection Generic drug: insulin regular Inject 8-20 Units into the skin 3 (three) times daily with meals. Sliding Scale   omega-3 acid ethyl esters 1 g capsule Commonly known as: LOVAZA Take 2 g by mouth daily.   OneTouch Ultra test strip Generic drug: glucose blood USE 1 STRIP TO CHECK GLUCOSE 3 TIMES DAILY. E11.22   PROBIOTIC DAILY PO Take 420 mg by mouth daily.   rosuvastatin 40 MG tablet Commonly known as: CRESTOR Take 1 tablet by mouth once daily   triamcinolone ointment 0.5 % Commonly known as:  KENALOG Apply 1 application topically 2 (two) times daily. To affected area, avoid eyes and face   Vitamin D3 50 MCG (2000 UT) Tabs Take 2,000 Units by mouth daily.   VITAMIN E COMPLETE PO Take 1 capsule by mouth daily. Vitamin E complex   warfarin 5 MG tablet Commonly known as: COUMADIN Take as directed by the anticoagulation clinic. If you are unsure how to take this medication, talk to your nurse or doctor. Original instructions: TAKE 1 TABLET BY MOUTH ONCE DAILY AT  6PM  MONITOR  INR  GOAL  SHOULD  BE  BETWEEN  2-3 What changed: additional instructions   ZINC SULFATE PO Take 1 tablet by mouth daily.       History (reviewed): Past Medical History:  Diagnosis Date  . Arthritis   . Brittle bones    per pt, has soft bones in right foot/wears boot cast!  . CAD (coronary artery disease)   . Cancer ([place])    skin cancer on arm  . Cataract    Bil/ surg scheduled for right eye 01/18/17/ left eye 02/08/17  . Charcot ankle, right 2019  . CHF (congestive heart failure) ([place]) 2015  . Diabetes mellitus  Type 2  . Heart failure, diastolic ([place])   . History of kidney stones   . Hyperlipidemia   . Hypertension   . Macular degeneration disease   . Macular edema 2014  . OSA on CPAP   . Paroxysmal atrial fibrillation ([facility])   . Personal history of colonic polyps - adenomas 01/28/2014  . Renal insufficiency    [facility] D[facility]  . Shortness of breath dyspnea   . Syncope and collapse    Past Surgical History:  Procedure Laterality Date  . AV FISTULA PLACEMENT Left 09/01/2020   Procedure: LEFT ARM ARTERIOVENOUS (AV) FISTULA;  Surgeon: Waynetta Sandy, MD;  Location: [place];  Service: Vascular;  Laterality: Left;  . BASCILIC VEIN TRANSPOSITION Left 10/27/2020   Procedure: LEFT SECOND STAGE BASILIC VEIN FISTULA TRANSPOSITION;  Surgeon: Waynetta Sandy, MD;  Location: [place];  Service: Vascular;  Laterality: Left;  . CARPAL TUNNEL RELEASE     left hand  .  COLONOSCOPY    . INTRAVASCULAR PRESSURE WIRE/FFR STUDY N/A 12/18/2018   Procedure: INTRAVASCULAR PRESSURE WIRE/FFR STUDY;  Surgeon: Wellington Hampshire, MD;  Location: [place] [facility];  Service: Cardiovascular;  Laterality: N/A;  . IR FLUORO GUIDE CV LINE RIGHT  12/16/2018  . IR US GUIDE VASC ACCESS RIGHT  12/16/2018  . KNEE ARTHROSCOPY Right 09/13/2016   [facility], Dr. Dorna Leitz  . PILONIDAL CYST EXCISION    . RIGHT/LEFT HEART CATH AND CORONARY ANGIOGRAPHY N/A 12/18/2018   Procedure: RIGHT/LEFT HEART CATH AND CORONARY ANGIOGRAPHY;  Surgeon: Wellington Hampshire, MD;  Location: [facility];  Service: Cardiovascular;  Laterality: N/A;   Family History  Problem Relation Age of Onset  . Diabetes Mother   . Kidney disease Mother   . Diabetes Father   . Coronary artery disease Father   . Diabetes Brother   . Diabetes Sister   . Prostate cancer Maternal Uncle   . Diabetes Maternal Grandmother   . Cancer Paternal Grandmother        unknown  . Heart attack Paternal Grandfather   . Colon cancer Neg Hx    Social History   Socioeconomic History  . Marital status: Married    Spouse name: [name]  . Number of children: 5  . Years of education: 17  . Highest education level: 12th grade  Occupational History  . Occupation: Warehouse    Comment: retired  Tobacco Use  . Smoking status: Former Smoker    Packs/day: 0.50    Years: 20.00    Pack years: 10.00    Types: Cigarettes    Quit date: 03/06/1977    Years since quitting: 44.0  . Smokeless tobacco: Never Used  Vaping Use  . Vaping Use: Never used  Substance and Sexual Activity  . Alcohol use: No  . Drug use: No  . Sexual activity: Not Currently  Other Topics Concern  . Not on file  Social History Narrative   Married wife has metastatic breast cancer   Retired Proofreader work   5 children   Former smoker no alcohol tobacco or drug use   Was very active with his church group is a coffee drinker as well   Social  Determinants of Radio broadcast assistant Strain: Low Risk   . Difficulty of Paying Living Expenses: Not hard at all  Food Insecurity: No Food Insecurity  . Worried About Charity fundraiser in the Last Year: Never true  . Ran Out of Food in  the Last Year: Never true  Transportation Needs: No Transportation Needs  . Lack of Transportation (Medical): No  . Lack of Transportation (Non-Medical): No  Physical Activity: Inactive  . Days of Exercise per Week: 0 days  . Minutes of Exercise per Session: 0 min  Stress: No Stress Concern Present  . Feeling of Stress : Not at all  Social Connections: Moderately Integrated  . Frequency of Communication with Friends and Family: Once a week  . Frequency of Social Gatherings with Friends and Family: More than three times a week  . Attends Religious Services: More than 4 times per year  . Active Member of Clubs or Organizations: No  . Attends Archivist Meetings: Never  . Marital Status: Married    Activities of Daily Living In your present state of health, do you have any difficulty performing the following activities: 03/14/2021 10/27/2020  Hearing? N N  Vision? N N  Difficulty concentrating or making decisions? N N  Walking or climbing stairs? Y Y  Comment due to neuropathy -  Dressing or bathing? Y N  Comment yes due to neuropathy -  Doing errands, shopping? Y -  Comment patient doesn't drive; currently has a fractured shoulder. -  Preparing Food and eating ? N -  Using the Toilet? Y -  Comment needs assistance due to his shoulder and neuropathy -  In the past six months, have you accidently leaked urine? Y -  Comment he does leak urine. -  Do you have problems with loss of bowel control? Y -  Comment only the days when the takes miralax -  Managing your Medications? Y -  Comment his wife helps him; due to neuropathy he is not able to open the bottles -  Managing your Finances? N -  Housekeeping or managing your  Housekeeping? Y -  Comment due to the shoulder fracture and neuropathy. -  Some recent data might be hidden    Patient Education/ Literacy How often do you need to have someone help you when you read instructions, pamphlets, or other written materials from your doctor or pharmacy?: 1 - Never What is the last grade level you completed in school?: 12th grade  Exercise Current Exercise Habits: The patient does not participate in regular exercise at present, Exercise limited by: neurologic condition(s)  Diet Patient reports consuming 3 meals a day and 1 snack(s) a day Patient reports that his primary diet is: Regular Patient reports that she does have regular access to food.   Depression Screen PHQ 2/9 Scores 03/14/2021 05/25/2020 04/15/2019 09/03/2018 06/10/2018 12/10/2017 06/12/2017  PHQ - 2 Score 0 0 0 0 0 2 0  PHQ- 9 Score - - - - - 4 -  Exception Documentation - - - - - Medical reason -     Fall Risk Fall Risk  03/14/2021 05/25/2020 04/13/2020 02/03/2020 10/08/2018  Falls in the past year? 1 0 - - 1  Comment - - - - Emmi Telephone Survey: data to providers prior to load  Number falls in past yr: 1 0 - - 1  Comment - - - - Emmi Telephone Survey Actual Response = 1  Injury with Fall? 1 - - - 0  Risk for fall due to : History of fall(s);Other (Comment) Impaired balance/gait Impaired balance/gait;Impaired mobility History of fall(s);Orthopedic patient;Impaired mobility -  Risk for fall due to: Comment neuropathy - - - -  Follow up Falls evaluation completed;Education provided;Falls prevention discussed - Falls prevention discussed Falls  prevention discussed -     Objective:  Louis [name] seemed alert and oriented and he participated appropriately during our telephone visit.  Blood Pressure Weight BMI  BP Readings from Last 3 Encounters:  03/11/21 (!) 141/53  03/02/21 127/65  02/22/21 120/62   Wt Readings from Last 3 Encounters:  03/02/21 231 lb (104.8 kg)  02/22/21 231 lb (104.8 kg)   02/16/21 236 lb 1.9 oz (107.1 kg)   BMI Readings from Last 1 Encounters:  03/02/21 36.18 kg/m    *Unable to obtain current vital signs, weight, and BMI due to telephone visit type  Hearing/Vision  . Marlin did not seem to have difficulty with hearing/understanding during the telephone conversation . Reports that he has had a formal eye exam by an eye care professional within the past year . Reports that he has not had a formal hearing evaluation within the past year *Unable to fully assess hearing and vision during telephone visit type  Cognitive Function: 6CIT Screen 09/03/2018 12/12/2016 12/12/2016  What Year? 0 points 0 points 0 points  What month? 0 points 0 points 0 points  What time? 0 points 0 points 0 points  Count back from 20 0 points 0 points 0 points  Months in reverse 0 points 0 points 0 points  Repeat phrase 2 points 0 points 0 points  Total Score 2 0 0   (Normal:0-7, Significant for Dysfunction: >8)  Normal Cognitive Function Screening: Yes   Immunization & Health Maintenance Record Immunization History  Administered Date(s) Administered  . Hep A / Hep B 11/21/2017, 12/24/2017, 04/29/2019  . Hepb-cpg 09/14/2020, 10/07/2020, 11/04/2020, 03/10/2021  . Influenza Split 08/29/2012  . Influenza Whole 10/16/2005, 09/19/2007  . Influenza,inj,Quad PF,6+ Mos 07/31/2013, 10/13/2014, 08/12/2015  . Pneumococcal Conjugate-13 04/27/2015, 03/12/2019  . Pneumococcal Polysaccharide-23 08/10/2006, 12/12/2016, 06/18/2019  . Td 10/16/2005  . Tdap 02/17/2016    Health Maintenance  Topic Date Due  . COVID-19 Vaccine (1) 05/25/2021 (Originally 10/27/1958)  . HEMOGLOBIN A1C  06/16/2021  . INFLUENZA VACCINE  06/20/2021  . OPHTHALMOLOGY EXAM  12/30/2021  . TETANUS/TDAP  02/16/2026  . Hepatitis C Screening  Completed  . PNA vac Low Risk Adult  Completed  . HPV VACCINES  Aged Out       Assessment  This is a routine wellness examination for Louis [name].  Health  Maintenance: Due or Overdue There are no preventive care reminders to display for this patient.  Louis [name] does not need a referral for Community Assistance: Care Management:   no Social Work:    no Prescription Assistance:  no Nutrition/Diabetes Education:  no   Plan:  Personalized Goals Goals Addressed              This Visit's Progress   .  Patient Stated (pt-stated)        03/14/2021 AWV Goal: Diabetes Management  . Patient will maintain an A1C level below 8.0 . Patient will not develop any diabetic foot complications . Patient will not experience any hypoglycemic episodes over the next 3 months . Patient will notify our office of any CBG readings outside of the provider recommended range by calling [phone] . Patient will adhere to provider recommendations for diabetes management  Patient Self Management Activities . take all medications as prescribed and report any negative side effects . monitor and record blood sugar readings as directed . adhere to a low carbohydrate diet that incorporates lean proteins, vegetables, whole grains, low glycemic fruits . check feet  daily noting any sores, cracks, injuries, or callous formations . see PCP or podiatrist if he notices any changes in his legs, feet, or toenails . Patient will visit PCP and have an A1C level checked every 3 to 6 months as directed  . have a yearly eye exam to monitor for vascular changes associated with diabetes and will request that the report be sent to his pcp.  . consult with his PCP regarding any changes in his health or new or worsening symptoms       Personalized Health Maintenance & Screening Recommendations  Shingles vaccine and Covid vaccine.   Patient declined vaccines at this time.  Lung Cancer Screening Recommended: no (Low Dose CT Chest recommended if Age 33-80 years, 30 pack-year currently smoking OR have quit w/in past 15 years) Hepatitis C Screening recommended: no HIV  Screening recommended: no  Advanced Directives: Written information was not prepared per patient's request.  Referrals & Orders No orders of the defined types were placed in this encounter.   Follow-up Plan . Follow-up with [name], MD as planned . Medicare wellness in one year.   I have personally reviewed and noted the following in the patient's chart:   . Medical and social history . Use of alcohol, tobacco or illicit drugs  . Current medications and supplements . Functional ability and status . Nutritional status . Physical activity . Advanced directives . List of other physicians . Hospitalizations, surgeries, and ER visits in previous 12 months . Vitals . Screenings to include cognitive, depression, and falls . Referrals and appointments  In addition, I have reviewed and discussed with Louis [name] certain preventive protocols, quality metrics, and best practice recommendations. A written personalized care plan for preventive services as well as general preventive health recommendations is available and can be mailed to the patient at his request.      Tinnie Gens, RN  03/14/2021

## 2021-03-14 NOTE — Patient Instructions (Addendum)
[place] Maintenance Summary and Written Plan of Care  [name] ,  Thank you for allowing me to perform your Medicare Annual Wellness Visit and for your ongoing commitment to your health.   Health Maintenance & Immunization History Health Maintenance  Topic Date Due  . COVID-19 Vaccine (1) 05/25/2021 (Originally 10/27/1958)  . HEMOGLOBIN A1C  06/16/2021  . INFLUENZA VACCINE  06/20/2021  . OPHTHALMOLOGY EXAM  12/30/2021  . TETANUS/TDAP  02/16/2026  . Hepatitis C Screening  Completed  . PNA vac Low Risk Adult  Completed  . HPV VACCINES  Aged Out   Immunization History  Administered Date(s) Administered  . Hep A / Hep B 11/21/2017, 12/24/2017, 04/29/2019  . Hepb-cpg 09/14/2020, 10/07/2020, 11/04/2020, 03/10/2021  . Influenza Split 08/29/2012  . Influenza Whole 10/16/2005, 09/19/2007  . Influenza,inj,Quad PF,6+ Mos 07/31/2013, 10/13/2014, 08/12/2015  . Pneumococcal Conjugate-13 04/27/2015, 03/12/2019  . Pneumococcal Polysaccharide-23 08/10/2006, 12/12/2016, 06/18/2019  . Td 10/16/2005  . Tdap 02/17/2016    These are the patient goals that we discussed: Goals Addressed              This Visit's Progress   .  Patient Stated (pt-stated)        03/14/2021 AWV Goal: Diabetes Management  . Patient will maintain an A1C level below 8.0 . Patient will not develop any diabetic foot complications . Patient will not experience any hypoglycemic episodes over the next 3 months . Patient will notify our office of any CBG readings outside of the provider recommended range by calling [phone] . Patient will adhere to provider recommendations for diabetes management  Patient Self Management Activities . take all medications as prescribed and report any negative side effects . monitor and record blood sugar readings as directed . adhere to a low carbohydrate diet that incorporates lean proteins, vegetables, whole grains, low glycemic fruits . check  feet daily noting any sores, cracks, injuries, or callous formations . see PCP or podiatrist if he notices any changes in his legs, feet, or toenails . Patient will visit PCP and have an A1C level checked every 3 to 6 months as directed  . have a yearly eye exam to monitor for vascular changes associated with diabetes and will request that the report be sent to his pcp.  . consult with his PCP regarding any changes in his health or new or worsening symptoms         This is a list of Health Maintenance Items that are overdue or due now: Shingles vaccine and Covid vaccine.   Patient declined vaccines at this time.  Orders/Referrals Placed Today: No orders of the defined types were placed in this encounter.  (Contact our referral department at [phone] if you have not spoken with someone about your referral appointment within the next 5 days)    Follow-up Plan . Follow-up with [name], MD as planned . Medicare wellness in one year.         Health Maintenance, Male Adopting a healthy lifestyle and getting preventive care are important in promoting health and wellness. Ask your health care provider about:  The right schedule for you to have regular tests and exams.  Things you can do on your own to prevent diseases and keep yourself healthy. What should I know about diet, weight, and exercise? Eat a healthy diet  Eat a diet that includes plenty of vegetables, fruits, low-fat dairy products, and lean protein.  Do not eat a lot of foods  that are high in solid fats, added sugars, or sodium.   Maintain a healthy weight Body mass index (BMI) is a measurement that can be used to identify possible weight problems. It estimates body fat based on height and weight. Your health care provider can help determine your BMI and help you achieve or maintain a healthy weight. Get regular exercise Get regular exercise. This is one of the most important things you can do for  your health. Most adults should:  Exercise for at least 150 minutes each week. The exercise should increase your heart rate and make you sweat (moderate-intensity exercise).  Do strengthening exercises at least twice a week. This is in addition to the moderate-intensity exercise.  Spend less time sitting. Even light physical activity can be beneficial. Watch cholesterol and blood lipids Have your blood tested for lipids and cholesterol at 76 years of age, then have this test every 5 years. You may need to have your cholesterol levels checked more often if:  Your lipid or cholesterol levels are high.  You are older than 76 years of age.  You are at high risk for heart disease. What should I know about cancer screening? Many types of cancers can be detected early and may often be prevented. Depending on your health history and family history, you may need to have cancer screening at various ages. This may include screening for:  Colorectal cancer.  Prostate cancer.  Skin cancer.  Lung cancer. What should I know about heart disease, diabetes, and high blood pressure? Blood pressure and heart disease  High blood pressure causes heart disease and increases the risk of stroke. This is more likely to develop in people who have high blood pressure readings, are of African descent, or are overweight.  Talk with your health care provider about your target blood pressure readings.  Have your blood pressure checked: ? Every 3-5 years if you are 76-76 years of age. ? Every year if you are 76 years old or older.  If you are between the ages of 38 and 50 and are a current or former smoker, ask your health care provider if you should have a one-time screening for abdominal aortic aneurysm (AAA). Diabetes Have regular diabetes screenings. This checks your fasting blood sugar level. Have the screening done:  Once every three years after age 76 if you are at a normal weight and have a low risk  for diabetes.  More often and at a younger age if you are overweight or have a high risk for diabetes. What should I know about preventing infection? Hepatitis B If you have a higher risk for hepatitis B, you should be screened for this virus. Talk with your health care provider to find out if you are at risk for hepatitis B infection. Hepatitis C Blood testing is recommended for:  Everyone born from [birth year] through 1966.  Anyone with known risk factors for hepatitis C. Sexually transmitted infections (STIs)  You should be screened each year for STIs, including gonorrhea and chlamydia, if: ? You are sexually active and are younger than 76 years of age. ? You are older than 76 years of age and your health care provider tells you that you are at risk for this type of infection. ? Your sexual activity has changed since you were last screened, and you are at increased risk for chlamydia or gonorrhea. Ask your health care provider if you are at risk.  Ask your health care provider about whether you  are at high risk for HIV. Your health care provider may recommend a prescription medicine to help prevent HIV infection. If you choose to take medicine to prevent HIV, you should first get tested for HIV. You should then be tested every 3 months for as long as you are taking the medicine. Follow these instructions at home: Lifestyle  Do not use any products that contain nicotine or tobacco, such as cigarettes, e-cigarettes, and chewing tobacco. If you need help quitting, ask your health care provider.  Do not use street drugs.  Do not share needles.  Ask your health care provider for help if you need support or information about quitting drugs. Alcohol use  Do not drink alcohol if your health care provider tells you not to drink.  If you drink alcohol: ? Limit how much you have to 0-2 drinks a day. ? Be aware of how much alcohol is in your drink. In the [place] one drink equals one 12 oz bottle  of beer (355 mL), one 5 oz glass of wine (148 mL), or one 1 oz glass of hard liquor (44 mL). General instructions  Schedule regular health, dental, and eye exams.  Stay current with your vaccines.  Tell your health care provider if: ? You often feel depressed. ? You have ever been abused or do not feel safe at home. Summary  Adopting a healthy lifestyle and getting preventive care are important in promoting health and wellness.  Follow your health care provider's instructions about healthy diet, exercising, and getting tested or screened for diseases.  Follow your health care provider's instructions on monitoring your cholesterol and blood pressure. This information is not intended to replace advice given to you by your health care provider. Make sure you discuss any questions you have with your health care provider. Document Revised: 10/30/2018 Document Reviewed: 10/30/2018 Elsevier Patient Education  2021 [employer].

## 2021-03-15 DIAGNOSIS — N2581 Secondary hyperparathyroidism of renal origin: Secondary | ICD-10-CM | POA: Diagnosis not present

## 2021-03-15 DIAGNOSIS — N186 End stage renal disease: Secondary | ICD-10-CM | POA: Diagnosis not present

## 2021-03-15 DIAGNOSIS — E876 Hypokalemia: Secondary | ICD-10-CM | POA: Diagnosis not present

## 2021-03-15 DIAGNOSIS — D509 Iron deficiency anemia, unspecified: Secondary | ICD-10-CM | POA: Diagnosis not present

## 2021-03-15 DIAGNOSIS — Z992 Dependence on renal dialysis: Secondary | ICD-10-CM | POA: Diagnosis not present

## 2021-03-15 DIAGNOSIS — D631 Anemia in chronic kidney disease: Secondary | ICD-10-CM | POA: Diagnosis not present

## 2021-03-16 ENCOUNTER — Ambulatory Visit (INDEPENDENT_AMBULATORY_CARE_PROVIDER_SITE_OTHER): Payer: Medicare Other | Admitting: Family Medicine

## 2021-03-16 VITALS — BP 112/62 | HR 70

## 2021-03-16 DIAGNOSIS — I4891 Unspecified atrial fibrillation: Secondary | ICD-10-CM | POA: Diagnosis not present

## 2021-03-16 LAB — POCT INR: INR: 2.3 (ref 2.0–3.0)

## 2021-03-17 DIAGNOSIS — D631 Anemia in chronic kidney disease: Secondary | ICD-10-CM | POA: Diagnosis not present

## 2021-03-17 DIAGNOSIS — E113213 Type 2 diabetes mellitus with mild nonproliferative diabetic retinopathy with macular edema, bilateral: Secondary | ICD-10-CM | POA: Diagnosis not present

## 2021-03-17 DIAGNOSIS — N2581 Secondary hyperparathyroidism of renal origin: Secondary | ICD-10-CM | POA: Diagnosis not present

## 2021-03-17 DIAGNOSIS — D509 Iron deficiency anemia, unspecified: Secondary | ICD-10-CM | POA: Diagnosis not present

## 2021-03-17 DIAGNOSIS — E876 Hypokalemia: Secondary | ICD-10-CM | POA: Diagnosis not present

## 2021-03-17 DIAGNOSIS — Z992 Dependence on renal dialysis: Secondary | ICD-10-CM | POA: Diagnosis not present

## 2021-03-17 DIAGNOSIS — Z961 Presence of intraocular lens: Secondary | ICD-10-CM | POA: Diagnosis not present

## 2021-03-17 DIAGNOSIS — N186 End stage renal disease: Secondary | ICD-10-CM | POA: Diagnosis not present

## 2021-03-17 DIAGNOSIS — Z794 Long term (current) use of insulin: Secondary | ICD-10-CM | POA: Diagnosis not present

## 2021-03-18 ENCOUNTER — Ambulatory Visit (INDEPENDENT_AMBULATORY_CARE_PROVIDER_SITE_OTHER): Payer: Medicare Other | Admitting: Family Medicine

## 2021-03-18 ENCOUNTER — Encounter: Payer: Self-pay | Admitting: Family Medicine

## 2021-03-18 ENCOUNTER — Other Ambulatory Visit: Payer: Self-pay

## 2021-03-18 VITALS — BP 123/55 | HR 54

## 2021-03-18 DIAGNOSIS — E11610 Type 2 diabetes mellitus with diabetic neuropathic arthropathy: Secondary | ICD-10-CM | POA: Diagnosis not present

## 2021-03-18 DIAGNOSIS — I482 Chronic atrial fibrillation, unspecified: Secondary | ICD-10-CM | POA: Diagnosis not present

## 2021-03-18 DIAGNOSIS — M1000 Idiopathic gout, unspecified site: Secondary | ICD-10-CM | POA: Diagnosis not present

## 2021-03-18 DIAGNOSIS — I25119 Atherosclerotic heart disease of native coronary artery with unspecified angina pectoris: Secondary | ICD-10-CM

## 2021-03-18 DIAGNOSIS — I10 Essential (primary) hypertension: Secondary | ICD-10-CM

## 2021-03-18 DIAGNOSIS — M10031 Idiopathic gout, right wrist: Secondary | ICD-10-CM | POA: Diagnosis not present

## 2021-03-18 LAB — POCT GLYCOSYLATED HEMOGLOBIN (HGB A1C): Hemoglobin A1C: 6.6 % — AB (ref 4.0–5.6)

## 2021-03-18 MED ORDER — CICLOPIROX OLAMINE 0.77 % EX CREA: TOPICAL_CREAM | Freq: Two times a day (BID) | CUTANEOUS | 1 refills | Status: DC

## 2021-03-18 MED ORDER — ALLOPURINOL 100 MG PO TABS
100.0000 mg | ORAL_TABLET | Freq: Every day | ORAL | 0 refills | Status: DC
Start: 2021-03-18 — End: 2021-10-24

## 2021-03-18 NOTE — Assessment & Plan Note (Addendum)
A1c of 6.6 if phenomenal!!! [place] work!  We will decrease Novolin in to 27 units in the morning.  Continue to monitor for lows.  We can always decrease to 26 units if needed.  Otherwise plan to follow-up in 3 months.

## 2021-03-18 NOTE — Progress Notes (Signed)
Established Patient Office Visit  Subjective:  Patient ID: Louis [name], male    DOB: 1946/08/31  Age: 75 y.o. MRN: [id number]  CC:  Chief Complaint  Patient presents with  . Diabetes  . Rash    HPI [name] presents for   Diabetes - no hypoglycemic events. No wounds or sores that are not healing well. No increased thirst or urination. Checking glucose at home. Taking medications as prescribed without any side effects.  Brought in home glucose log.  His numbers overall look fantastic occasionally have a glucose a little over 200 at bedtime and it usually if he has been eating some sweets he says he has been trying to replace that with healthy fruit.  He has had about 4 hypoglycemic episodes in the 70s usually around lunchtime or midday occasionally its been because they ate a little later than usual.  Currently using 28 units of in in the morning and 30 to 35 units of N in the evening.  Also using short acting with each meal.  He was also recently on Cipro actually been following him a little bit more closely for his INR checks.  He is actually done well while being on the medication.Finished Cipro yesterday.    Still dealing with a groin rash.  Has been using the clotrimazole cream.  Also recently started fluconazole as well.    Past Medical History:  Diagnosis Date  . Arthritis   . Brittle bones    per pt, has soft bones in right foot/wears boot cast!  . CAD (coronary artery disease)   . Cancer ([place])    skin cancer on arm  . Cataract    Bil/ surg scheduled for right eye 01/18/17/ left eye 02/08/17  . Charcot ankle, right 2019  . CHF (congestive heart failure) ([place]) 2015  . Diabetes mellitus    Type 2  . Heart failure, diastolic ([place])   . History of kidney stones   . Hyperlipidemia   . Hypertension   . Macular degeneration disease   . Macular edema 2014  . OSA on CPAP   . Paroxysmal atrial fibrillation (HCC)   . Personal history of colonic polyps - adenomas  01/28/2014  . Renal insufficiency    Tu/Th/Sa Dialysis  . Shortness of breath dyspnea   . Syncope and collapse     Past Surgical History:  Procedure Laterality Date  . AV FISTULA PLACEMENT Left 09/01/2020   Procedure: LEFT ARM ARTERIOVENOUS (AV) FISTULA;  Surgeon: Waynetta Sandy, MD;  Location: [facility];  Service: Vascular;  Laterality: Left;  . BASCILIC VEIN TRANSPOSITION Left 10/27/2020   Procedure: LEFT SECOND STAGE BASILIC VEIN FISTULA TRANSPOSITION;  Surgeon: Waynetta Sandy, MD;  Location: [facility];  Service: Vascular;  Laterality: Left;  . CARPAL TUNNEL RELEASE     left hand  . COLONOSCOPY    . INTRAVASCULAR PRESSURE WIRE/FFR STUDY N/A 12/18/2018   Procedure: INTRAVASCULAR PRESSURE WIRE/FFR STUDY;  Surgeon: Wellington Hampshire, MD;  Location: [facility];  Service: Cardiovascular;  Laterality: N/A;  . IR FLUORO GUIDE CV LINE RIGHT  12/16/2018  . IR US GUIDE VASC ACCESS RIGHT  12/16/2018  . KNEE ARTHROSCOPY Right 09/13/2016   [facility], Dr. Dorna Leitz  . PILONIDAL CYST EXCISION    . RIGHT/LEFT HEART CATH AND CORONARY ANGIOGRAPHY N/A 12/18/2018   Procedure: RIGHT/LEFT HEART CATH AND CORONARY ANGIOGRAPHY;  Surgeon: Wellington Hampshire, MD;  Location: [facility];  Service: Cardiovascular;  Laterality: N/A;    Family History  Problem Relation Age of Onset  . Diabetes Mother   . Kidney disease Mother   . Diabetes Father   . Coronary artery disease Father   . Diabetes Brother   . Diabetes Sister   . Prostate cancer Maternal Uncle   . Diabetes Maternal Grandmother   . Cancer Paternal Grandmother        unknown  . Heart attack Paternal Grandfather   . Colon cancer Neg Hx     Social History   Socioeconomic History  . Marital status: Married    Spouse name: [name]  . Number of children: 5  . Years of education: 28  . Highest education level: 12th grade  Occupational History  . Occupation: Warehouse    Comment: retired  Tobacco Use  .  Smoking status: Former Smoker    Packs/day: 0.50    Years: 20.00    Pack years: 10.00    Types: Cigarettes    Quit date: 03/06/1977    Years since quitting: 44.0  . Smokeless tobacco: Never Used  Vaping Use  . Vaping Use: Never used  Substance and Sexual Activity  . Alcohol use: No  . Drug use: No  . Sexual activity: Not Currently  Other Topics Concern  . Not on file  Social History Narrative   Married wife has metastatic breast cancer   Retired Proofreader work   5 children   Former smoker no alcohol tobacco or drug use   Was very active with his church group is a coffee drinker as well   Social Determinants of Radio broadcast assistant Strain: Low Risk   . Difficulty of Paying Living Expenses: Not hard at all  Food Insecurity: No Food Insecurity  . Worried About Charity fundraiser in the Last Year: Never true  . Ran Out of Food in the Last Year: Never true  Transportation Needs: No Transportation Needs  . Lack of Transportation (Medical): No  . Lack of Transportation (Non-Medical): No  Physical Activity: Inactive  . Days of Exercise per Week: 0 days  . Minutes of Exercise per Session: 0 min  Stress: No Stress Concern Present  . Feeling of Stress : Not at all  Social Connections: Moderately Integrated  . Frequency of Communication with Friends and Family: Once a week  . Frequency of Social Gatherings with Friends and Family: More than three times a week  . Attends Religious Services: More than 4 times per year  . Active Member of Clubs or Organizations: No  . Attends Archivist Meetings: Never  . Marital Status: Married  Human resources officer Violence: Not At Risk  . Fear of Current or Ex-Partner: No  . Emotionally Abused: No  . Physically Abused: No  . Sexually Abused: No    Outpatient Medications Prior to Visit  Medication Sig Dispense Refill  . AMBULATORY NON FORMULARY MEDICATION Continuous positive airway pressure (CPAP) device: Auto titrate minimum 4  cm H20 to maximum of 20 cm H2O with pressure. Please provide all supplemental supplies as needed. Fax to: [phone] 1 Units 1  . AMBULATORY NON FORMULARY MEDICATION CPAP supplies - Disposable filters (white), Replaceable pillow or cushions, Mask and Tubing as needed. 1 each prn  . amLODipine (NORVASC) 2.5 MG tablet Take 1 tablet (2.5 mg total) by mouth daily. 90 tablet 3  . B-D INS SYR ULTRAFINE 1CC/31G 31G X 5/16" 1 ML MISC     . blood glucose meter kit  and supplies Use to check blood sugars daily E11.65    . Cholecalciferol (VITAMIN D3) 50 MCG (2000 UT) TABS Take 2,000 Units by mouth daily.     . Coenzyme Q10 (COQ10) 200 MG CAPS Take 200 mg by mouth daily.    . Continuous Blood Gluc Receiver (FREESTYLE LIBRE 14 DAY READER) DEVI Dx DM E11.22 Check blood sugar 4 times daily. 1 each prn  . Continuous Blood Gluc Sensor (FREESTYLE LIBRE 14 DAY SENSOR) MISC Dx DM E11.22 Check blood sugar 4 times daily. 1 each prn  . doxercalciferol (HECTOROL) 4 MCG/2ML injection Doxercalciferol (Hectorol)    . fluconazole (DIFLUCAN) 200 MG tablet Take 200 mg by mouth. Once a week.    . furosemide (LASIX) 80 MG tablet Take 2 tablets (160 mg total) by mouth 2 (two) times daily. Morning & after lunch 360 tablet 1  . gabapentin (NEURONTIN) 300 MG capsule Take 1 capsule by mouth daily.    . hydrALAZINE (APRESOLINE) 100 MG tablet Take 1 tablet (100 mg total) by mouth 3 (three) times daily. 270 tablet 3  . isosorbide dinitrate (ISORDIL) 20 MG tablet Take 1 tablet (20 mg total) by mouth 2 (two) times daily. 180 tablet 3  . lidocaine-prilocaine (EMLA) cream SMARTSIG:Sparingly Topical    . Methoxy PEG-Epoetin Beta (MIRCERA IJ) Mircera    . nitroGLYCERIN (NITROSTAT) 0.4 MG SL tablet Place 1 tablet (0.4 mg total) under the tongue every 5 (five) minutes as needed for chest pain. 25 tablet 11  . NOVOLIN N 100 UNIT/ML injection Inject 20-30 Units into the skin See admin instructions. Inject 30 units subcutaneously in the morning &  inject 20-30 units subcutaneously at bedtime (depending on blood sugar)    . NOVOLIN R 100 UNIT/ML injection Inject 8-20 Units into the skin 3 (three) times daily with meals. Sliding Scale  5  . omega-3 acid ethyl esters (LOVAZA) 1 g capsule Take 2 g by mouth daily.     Glory Rosebush ULTRA test strip USE 1 STRIP TO CHECK GLUCOSE 3 TIMES DAILY. E11.22 300 each 11  . Probiotic Product (PROBIOTIC DAILY PO) Take 420 mg by mouth daily.    . rosuvastatin (CRESTOR) 40 MG tablet Take 1 tablet by mouth once daily 90 tablet 3  . triamcinolone ointment (KENALOG) 0.5 % Apply 1 application topically 2 (two) times daily. To affected area, avoid eyes and face 30 g 3  . Vitamin Mixture (VITAMIN E COMPLETE PO) Take 1 capsule by mouth daily. Vitamin E complex    . warfarin (COUMADIN) 5 MG tablet TAKE 1 TABLET BY MOUTH ONCE DAILY AT  6PM  MONITOR  INR  GOAL  SHOULD  BE  BETWEEN  2-3 (Patient taking differently: TAKE 1 TABLET BY MOUTH ONCE DAILY AT  6PM  MONITOR  INR  GOAL  SHOULD  BE  BETWEEN  2-3  Per his wife, he is currently taking 2.5 mg Tuesday and Friday. $RemoveBef'5mg'[id number]$  on the other days) 90 tablet prn  . ZINC SULFATE PO Take 1 tablet by mouth daily.     Marland Kitchen allopurinol (ZYLOPRIM) 100 MG tablet TAKE 1 TO 1 & 1/2 (ONE & ONE-HALF) TABLETS BY MOUTH TWICE DAILY (Patient taking differently: Take 100 mg by mouth daily.) 270 tablet 1  . ciprofloxacin (CIPRO) 500 MG tablet Take 500 mg by mouth at bedtime.     Facility-Administered Medications Prior to Visit  Medication Dose Route Frequency Provider Last Rate Last Admin  . 0.9 %  sodium chloride infusion  500 mL  Intravenous Once Gatha Mayer, MD        Allergies  Allergen Reactions  . Hydrocodone Nausea And Vomiting    Other reaction(s): GI Upset (intolerance) Projectile vomiting   . Oxycodone Nausea And Vomiting    Other reaction(s): GI Upset (intolerance), Vomiting (intolerance) Projectile vomiting   . Dacarbazine Other (See Comments)    Unknown reaction  . Tape  Dermatitis, Itching and Rash    Patch used at dialysis    ROS Review of Systems    Objective:    Physical Exam Vitals reviewed.  Constitutional:      Appearance: He is well-developed.  HENT:     Head: Normocephalic and atraumatic.  Eyes:     Conjunctiva/sclera: Conjunctivae normal.  Cardiovascular:     Rate and Rhythm: Normal rate.  Pulmonary:     Effort: Pulmonary effort is normal.  Skin:    General: Skin is dry.     Coloration: Skin is not pale.  Neurological:     Mental Status: He is alert and oriented to person, place, and time.  Psychiatric:        Behavior: Behavior normal.     BP (!) 123/55   Pulse (!) 54  Wt Readings from Last 3 Encounters:  03/14/21 235 lb 6.4 oz (106.8 kg)  03/02/21 231 lb (104.8 kg)  02/22/21 231 lb (104.8 kg)     There are no preventive care reminders to display for this patient.  There are no preventive care reminders to display for this patient.  Lab Results  Component Value Date   TSH 2.391 02/02/2017   Lab Results  Component Value Date   WBC 11.4 09/09/2020   HGB 12.9 (L) 10/27/2020   HCT 38.0 (L) 10/27/2020   MCV 101 09/09/2020   PLT 234 03/08/2020   Lab Results  Component Value Date   NA 135 10/27/2020   K 4.1 10/27/2020   CO2 27 10/27/2019   GLUCOSE 184 (H) 10/27/2020   BUN 43 (H) 10/27/2020   CREATININE 3.90 (H) 10/27/2020   BILITOT 0.8 05/28/2020   ALKPHOS 105 10/27/2019   AST 23 05/28/2020   ALT 17 05/28/2020   PROT 6.2 05/28/2020   ALBUMIN 2.9 (A) 09/09/2020   CALCIUM 9.2 10/27/2019   ANIONGAP 13 10/27/2019   GFR 38.90 (L) 03/10/2011   Lab Results  Component Value Date   CHOL 118 05/28/2020   Lab Results  Component Value Date   HDL 38 (L) 05/28/2020   Lab Results  Component Value Date   LDLCALC 56 05/28/2020   Lab Results  Component Value Date   TRIG 161 (H) 05/28/2020   Lab Results  Component Value Date   CHOLHDL 3.1 05/28/2020   Lab Results  Component Value Date   HGBA1C 6.6 (A)  03/18/2021      Assessment & Plan:   Problem List Items Addressed This Visit      Cardiovascular and Mediastinum   HYPERTENSION, BENIGN SYSTEMIC - Primary    Well controlled. Continue current regimen. Follow up in 3 months.        Atrial fibrillation (HCC)    INR actually looks great today just finished up the Cipro yesterday though he is starting Diflucan every other day as of tomorrow so we will plan to recheck his INR on Monday.      Atherosclerosis of native coronary artery of native heart with angina pectoris [facility])    Doing well with daily statin.  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 [place] work!  We will decrease Novolin in to 27 units in the morning.  Continue to monitor for lows.  We can always decrease to 26 units if needed.  Otherwise plan to follow-up in 3 months.      Relevant Orders   POCT glycosylated hemoglobin (Hb A1C) (Completed)     Other   Gout    Updated his medication list he is actually really only taking the allopurinol once a day and we still had it for twice a day.      Relevant Medications   allopurinol (ZYLOPRIM) 100 MG tablet     Rash-it still looks most consistent with either jock itch or erythrasma.  Working to switch the clotrimazole which she is already been using for a little over 3 weeks to Loprox.  I Minna have him wash the area with [facility] and instead of taking the Diflucan weekly for 5 weeks have him take it every other day for 3 tabs.  If he still struggling and the rash is not improving over the next 3 weeks encouraged him to let me know.  Meds ordered this encounter  Medications  . allopurinol (ZYLOPRIM) 100 MG tablet    Sig: Take 1 tablet (100 mg total) by mouth daily.    Dispense:  1 tablet    Refill:  0  . ciclopirox (LOPROX) 0.77 % cream    Sig: Apply topically 2 (two) times daily. X 3 weeks    Dispense:  30 g    Refill:  1    Follow-up:  Return in about 3 months (around 06/17/2021) for Diabetes follow-up.    [name], MD

## 2021-03-18 NOTE — Assessment & Plan Note (Signed)
Well controlled. Continue current regimen. Follow up in  3 months.  

## 2021-03-18 NOTE — Assessment & Plan Note (Signed)
INR actually looks great today just finished up the Cipro yesterday though he is starting Diflucan every other day as of tomorrow so we will plan to recheck his INR on Monday.

## 2021-03-18 NOTE — Patient Instructions (Signed)
OK to decrease the morning AM to 27 units of N.

## 2021-03-18 NOTE — Assessment & Plan Note (Signed)
Updated his medication list he is actually really only taking the allopurinol once a day and we still had it for twice a day.

## 2021-03-18 NOTE — Assessment & Plan Note (Signed)
Doing well with daily statin.

## 2021-03-19 DIAGNOSIS — Z992 Dependence on renal dialysis: Secondary | ICD-10-CM | POA: Diagnosis not present

## 2021-03-19 DIAGNOSIS — E876 Hypokalemia: Secondary | ICD-10-CM | POA: Diagnosis not present

## 2021-03-19 DIAGNOSIS — D509 Iron deficiency anemia, unspecified: Secondary | ICD-10-CM | POA: Diagnosis not present

## 2021-03-19 DIAGNOSIS — N186 End stage renal disease: Secondary | ICD-10-CM | POA: Diagnosis not present

## 2021-03-19 DIAGNOSIS — D631 Anemia in chronic kidney disease: Secondary | ICD-10-CM | POA: Diagnosis not present

## 2021-03-19 DIAGNOSIS — N2581 Secondary hyperparathyroidism of renal origin: Secondary | ICD-10-CM | POA: Diagnosis not present

## 2021-03-20 DIAGNOSIS — N186 End stage renal disease: Secondary | ICD-10-CM | POA: Diagnosis not present

## 2021-03-20 DIAGNOSIS — E1122 Type 2 diabetes mellitus with diabetic chronic kidney disease: Secondary | ICD-10-CM | POA: Diagnosis not present

## 2021-03-20 DIAGNOSIS — Z992 Dependence on renal dialysis: Secondary | ICD-10-CM | POA: Diagnosis not present

## 2021-03-21 ENCOUNTER — Other Ambulatory Visit: Payer: Self-pay

## 2021-03-21 ENCOUNTER — Ambulatory Visit (INDEPENDENT_AMBULATORY_CARE_PROVIDER_SITE_OTHER): Payer: Medicare Other | Admitting: Sports Medicine

## 2021-03-21 ENCOUNTER — Encounter: Payer: Self-pay | Admitting: Sports Medicine

## 2021-03-21 VITALS — BP 128/56 | HR 72

## 2021-03-21 DIAGNOSIS — I25119 Atherosclerotic heart disease of native coronary artery with unspecified angina pectoris: Secondary | ICD-10-CM

## 2021-03-21 DIAGNOSIS — I4891 Unspecified atrial fibrillation: Secondary | ICD-10-CM | POA: Diagnosis not present

## 2021-03-21 DIAGNOSIS — S42295D Other nondisplaced fracture of upper end of left humerus, subsequent encounter for fracture with routine healing: Secondary | ICD-10-CM | POA: Diagnosis not present

## 2021-03-21 DIAGNOSIS — Z7901 Long term (current) use of anticoagulants: Secondary | ICD-10-CM | POA: Diagnosis not present

## 2021-03-21 LAB — POCT INR: INR: 2.8 (ref 2.0–3.0)

## 2021-03-21 NOTE — Addendum Note (Signed)
Addended by: Dema Severin on: 03/21/2021 03:39 PM   Modules accepted: Orders

## 2021-03-21 NOTE — Progress Notes (Addendum)
    Procedures performed today:    None.  Independent interpretation of notes and tests performed by another provider:   None.  Brief History, Exam, Impression, and Recommendations:    Fracture of humerus, left, closed Louis [name] returns, he is a pleasant 75 year old male, approximately a month ago he had a fall onto his left shoulder, he was subsequently unable to abduct the shoulder and had severe persistent pain. Ultimately got an x-ray and an MRI, due to concern for rotator cuff tearing. It was noted that he had a mildly impacted humeral head fracture. We treated him conservatively, he returns today with complete relief of pain and full range of motion. This brings him about a month post fracture. I really do not think he needs physical therapy or needs to see me anymore, he is wondering if he can use his 0 turn lawnmower. We did some strength testing, he had good strength, no pain, I think he is cleared to do activities as tolerated. Return as needed.  Anticoagulant long-term use INR has looked good the past several checks and looks good today, repeat in a month.    ___________________________________________ Gwen Her. Dianah Field, M.D., ABFM., CAQSM. Primary Care and [place] Instructor of [place] [facility]

## 2021-03-21 NOTE — Assessment & Plan Note (Signed)
Louis [name] returns, Louis [name] is a pleasant 75 year old male, approximately a month ago Louis [name] had a fall onto his left shoulder, Louis [name] was subsequently unable to abduct the shoulder and had severe persistent pain. Ultimately got an x-ray and an MRI, due to concern for rotator cuff tearing. It was noted that Louis [name] had a mildly impacted humeral head fracture. We treated him conservatively, Louis [name] returns today with complete relief of pain and full range of motion. This brings him about a month post fracture. I really do not think Louis [name] needs physical therapy or needs to see me anymore, Louis [name] is wondering if Louis [name] can use his 0 turn lawnmower. We did some strength testing, Louis [name] had good strength, no pain, I think Louis [name] is cleared to do activities as tolerated. Return as needed.

## 2021-03-22 DIAGNOSIS — N2581 Secondary hyperparathyroidism of renal origin: Secondary | ICD-10-CM | POA: Diagnosis not present

## 2021-03-22 DIAGNOSIS — E1122 Type 2 diabetes mellitus with diabetic chronic kidney disease: Secondary | ICD-10-CM | POA: Diagnosis not present

## 2021-03-22 DIAGNOSIS — E876 Hypokalemia: Secondary | ICD-10-CM | POA: Diagnosis not present

## 2021-03-22 DIAGNOSIS — N186 End stage renal disease: Secondary | ICD-10-CM | POA: Diagnosis not present

## 2021-03-22 DIAGNOSIS — D631 Anemia in chronic kidney disease: Secondary | ICD-10-CM | POA: Diagnosis not present

## 2021-03-22 DIAGNOSIS — Z992 Dependence on renal dialysis: Secondary | ICD-10-CM | POA: Diagnosis not present

## 2021-03-22 DIAGNOSIS — D509 Iron deficiency anemia, unspecified: Secondary | ICD-10-CM | POA: Diagnosis not present

## 2021-03-24 DIAGNOSIS — D509 Iron deficiency anemia, unspecified: Secondary | ICD-10-CM | POA: Diagnosis not present

## 2021-03-24 DIAGNOSIS — E876 Hypokalemia: Secondary | ICD-10-CM | POA: Diagnosis not present

## 2021-03-24 DIAGNOSIS — N2581 Secondary hyperparathyroidism of renal origin: Secondary | ICD-10-CM | POA: Diagnosis not present

## 2021-03-24 DIAGNOSIS — Z992 Dependence on renal dialysis: Secondary | ICD-10-CM | POA: Diagnosis not present

## 2021-03-24 DIAGNOSIS — N186 End stage renal disease: Secondary | ICD-10-CM | POA: Diagnosis not present

## 2021-03-24 DIAGNOSIS — D631 Anemia in chronic kidney disease: Secondary | ICD-10-CM | POA: Diagnosis not present

## 2021-03-26 DIAGNOSIS — N186 End stage renal disease: Secondary | ICD-10-CM | POA: Diagnosis not present

## 2021-03-26 DIAGNOSIS — Z992 Dependence on renal dialysis: Secondary | ICD-10-CM | POA: Diagnosis not present

## 2021-03-26 DIAGNOSIS — D509 Iron deficiency anemia, unspecified: Secondary | ICD-10-CM | POA: Diagnosis not present

## 2021-03-26 DIAGNOSIS — E876 Hypokalemia: Secondary | ICD-10-CM | POA: Diagnosis not present

## 2021-03-26 DIAGNOSIS — D631 Anemia in chronic kidney disease: Secondary | ICD-10-CM | POA: Diagnosis not present

## 2021-03-26 DIAGNOSIS — N2581 Secondary hyperparathyroidism of renal origin: Secondary | ICD-10-CM | POA: Diagnosis not present

## 2021-03-29 DIAGNOSIS — N186 End stage renal disease: Secondary | ICD-10-CM | POA: Diagnosis not present

## 2021-03-29 DIAGNOSIS — E876 Hypokalemia: Secondary | ICD-10-CM | POA: Diagnosis not present

## 2021-03-29 DIAGNOSIS — D631 Anemia in chronic kidney disease: Secondary | ICD-10-CM | POA: Diagnosis not present

## 2021-03-29 DIAGNOSIS — D509 Iron deficiency anemia, unspecified: Secondary | ICD-10-CM | POA: Diagnosis not present

## 2021-03-29 DIAGNOSIS — Z992 Dependence on renal dialysis: Secondary | ICD-10-CM | POA: Diagnosis not present

## 2021-03-29 DIAGNOSIS — N2581 Secondary hyperparathyroidism of renal origin: Secondary | ICD-10-CM | POA: Diagnosis not present

## 2021-03-30 NOTE — Assessment & Plan Note (Signed)
INR has looked good the past several checks and looks good today, repeat in a month.

## 2021-03-31 DIAGNOSIS — N2581 Secondary hyperparathyroidism of renal origin: Secondary | ICD-10-CM | POA: Diagnosis not present

## 2021-03-31 DIAGNOSIS — D509 Iron deficiency anemia, unspecified: Secondary | ICD-10-CM | POA: Diagnosis not present

## 2021-03-31 DIAGNOSIS — E876 Hypokalemia: Secondary | ICD-10-CM | POA: Diagnosis not present

## 2021-03-31 DIAGNOSIS — D631 Anemia in chronic kidney disease: Secondary | ICD-10-CM | POA: Diagnosis not present

## 2021-03-31 DIAGNOSIS — N186 End stage renal disease: Secondary | ICD-10-CM | POA: Diagnosis not present

## 2021-03-31 DIAGNOSIS — Z992 Dependence on renal dialysis: Secondary | ICD-10-CM | POA: Diagnosis not present

## 2021-04-02 DIAGNOSIS — E876 Hypokalemia: Secondary | ICD-10-CM | POA: Diagnosis not present

## 2021-04-02 DIAGNOSIS — N186 End stage renal disease: Secondary | ICD-10-CM | POA: Diagnosis not present

## 2021-04-02 DIAGNOSIS — D631 Anemia in chronic kidney disease: Secondary | ICD-10-CM | POA: Diagnosis not present

## 2021-04-02 DIAGNOSIS — D509 Iron deficiency anemia, unspecified: Secondary | ICD-10-CM | POA: Diagnosis not present

## 2021-04-02 DIAGNOSIS — Z992 Dependence on renal dialysis: Secondary | ICD-10-CM | POA: Diagnosis not present

## 2021-04-02 DIAGNOSIS — N2581 Secondary hyperparathyroidism of renal origin: Secondary | ICD-10-CM | POA: Diagnosis not present

## 2021-04-04 ENCOUNTER — Ambulatory Visit (INDEPENDENT_AMBULATORY_CARE_PROVIDER_SITE_OTHER): Payer: Medicare Other | Admitting: Family Medicine

## 2021-04-04 ENCOUNTER — Other Ambulatory Visit: Payer: Self-pay

## 2021-04-04 VITALS — BP 139/66 | HR 72

## 2021-04-04 DIAGNOSIS — I4891 Unspecified atrial fibrillation: Secondary | ICD-10-CM

## 2021-04-04 LAB — POCT INR: INR: 2.1 (ref 2.0–3.0)

## 2021-04-04 NOTE — Progress Notes (Signed)
F/U in 4 weeks

## 2021-04-05 DIAGNOSIS — N186 End stage renal disease: Secondary | ICD-10-CM | POA: Diagnosis not present

## 2021-04-05 DIAGNOSIS — N2581 Secondary hyperparathyroidism of renal origin: Secondary | ICD-10-CM | POA: Diagnosis not present

## 2021-04-05 DIAGNOSIS — D509 Iron deficiency anemia, unspecified: Secondary | ICD-10-CM | POA: Diagnosis not present

## 2021-04-05 DIAGNOSIS — Z992 Dependence on renal dialysis: Secondary | ICD-10-CM | POA: Diagnosis not present

## 2021-04-05 DIAGNOSIS — E876 Hypokalemia: Secondary | ICD-10-CM | POA: Diagnosis not present

## 2021-04-05 DIAGNOSIS — D631 Anemia in chronic kidney disease: Secondary | ICD-10-CM | POA: Diagnosis not present

## 2021-04-05 NOTE — Progress Notes (Signed)
Patient's wife advised

## 2021-04-06 ENCOUNTER — Telehealth: Payer: Self-pay | Admitting: Family Medicine

## 2021-04-06 NOTE — Chronic Care Management (AMB) (Signed)
  Chronic Care Management   Outreach Note  04/06/2021 Name: ARION SHANKLES MRN: [id number] DOB: 22-Jun-1946  Referred by: [name], MD Reason for referral : No chief complaint on file.   An unsuccessful telephone outreach was attempted today. The patient was referred to the pharmacist for assistance with care management and care coordination.   Follow Up Plan:   Lauretta Grill Upstream Scheduler

## 2021-04-07 DIAGNOSIS — Z794 Long term (current) use of insulin: Secondary | ICD-10-CM | POA: Diagnosis not present

## 2021-04-07 DIAGNOSIS — E876 Hypokalemia: Secondary | ICD-10-CM | POA: Diagnosis not present

## 2021-04-07 DIAGNOSIS — E113213 Type 2 diabetes mellitus with mild nonproliferative diabetic retinopathy with macular edema, bilateral: Secondary | ICD-10-CM | POA: Diagnosis not present

## 2021-04-07 DIAGNOSIS — Z992 Dependence on renal dialysis: Secondary | ICD-10-CM | POA: Diagnosis not present

## 2021-04-07 DIAGNOSIS — D509 Iron deficiency anemia, unspecified: Secondary | ICD-10-CM | POA: Diagnosis not present

## 2021-04-07 DIAGNOSIS — N2581 Secondary hyperparathyroidism of renal origin: Secondary | ICD-10-CM | POA: Diagnosis not present

## 2021-04-07 DIAGNOSIS — Z961 Presence of intraocular lens: Secondary | ICD-10-CM | POA: Diagnosis not present

## 2021-04-07 DIAGNOSIS — N186 End stage renal disease: Secondary | ICD-10-CM | POA: Diagnosis not present

## 2021-04-07 DIAGNOSIS — D631 Anemia in chronic kidney disease: Secondary | ICD-10-CM | POA: Diagnosis not present

## 2021-04-09 DIAGNOSIS — E876 Hypokalemia: Secondary | ICD-10-CM | POA: Diagnosis not present

## 2021-04-09 DIAGNOSIS — D631 Anemia in chronic kidney disease: Secondary | ICD-10-CM | POA: Diagnosis not present

## 2021-04-09 DIAGNOSIS — D509 Iron deficiency anemia, unspecified: Secondary | ICD-10-CM | POA: Diagnosis not present

## 2021-04-09 DIAGNOSIS — N2581 Secondary hyperparathyroidism of renal origin: Secondary | ICD-10-CM | POA: Diagnosis not present

## 2021-04-09 DIAGNOSIS — Z992 Dependence on renal dialysis: Secondary | ICD-10-CM | POA: Diagnosis not present

## 2021-04-09 DIAGNOSIS — N186 End stage renal disease: Secondary | ICD-10-CM | POA: Diagnosis not present

## 2021-04-12 DIAGNOSIS — E876 Hypokalemia: Secondary | ICD-10-CM | POA: Diagnosis not present

## 2021-04-12 DIAGNOSIS — D631 Anemia in chronic kidney disease: Secondary | ICD-10-CM | POA: Diagnosis not present

## 2021-04-12 DIAGNOSIS — N186 End stage renal disease: Secondary | ICD-10-CM | POA: Diagnosis not present

## 2021-04-12 DIAGNOSIS — N2581 Secondary hyperparathyroidism of renal origin: Secondary | ICD-10-CM | POA: Diagnosis not present

## 2021-04-12 DIAGNOSIS — D509 Iron deficiency anemia, unspecified: Secondary | ICD-10-CM | POA: Diagnosis not present

## 2021-04-12 DIAGNOSIS — Z992 Dependence on renal dialysis: Secondary | ICD-10-CM | POA: Diagnosis not present

## 2021-04-14 ENCOUNTER — Other Ambulatory Visit: Payer: Self-pay | Admitting: Cardiology

## 2021-04-14 DIAGNOSIS — Z992 Dependence on renal dialysis: Secondary | ICD-10-CM | POA: Diagnosis not present

## 2021-04-14 DIAGNOSIS — I2510 Atherosclerotic heart disease of native coronary artery without angina pectoris: Secondary | ICD-10-CM

## 2021-04-14 DIAGNOSIS — N2581 Secondary hyperparathyroidism of renal origin: Secondary | ICD-10-CM | POA: Diagnosis not present

## 2021-04-14 DIAGNOSIS — D631 Anemia in chronic kidney disease: Secondary | ICD-10-CM | POA: Diagnosis not present

## 2021-04-14 DIAGNOSIS — E876 Hypokalemia: Secondary | ICD-10-CM | POA: Diagnosis not present

## 2021-04-14 DIAGNOSIS — D509 Iron deficiency anemia, unspecified: Secondary | ICD-10-CM | POA: Diagnosis not present

## 2021-04-14 DIAGNOSIS — I10 Essential (primary) hypertension: Secondary | ICD-10-CM

## 2021-04-14 DIAGNOSIS — N186 End stage renal disease: Secondary | ICD-10-CM | POA: Diagnosis not present

## 2021-04-14 DIAGNOSIS — E785 Hyperlipidemia, unspecified: Secondary | ICD-10-CM

## 2021-04-14 DIAGNOSIS — R001 Bradycardia, unspecified: Secondary | ICD-10-CM

## 2021-04-16 DIAGNOSIS — N186 End stage renal disease: Secondary | ICD-10-CM | POA: Diagnosis not present

## 2021-04-16 DIAGNOSIS — D631 Anemia in chronic kidney disease: Secondary | ICD-10-CM | POA: Diagnosis not present

## 2021-04-16 DIAGNOSIS — N2581 Secondary hyperparathyroidism of renal origin: Secondary | ICD-10-CM | POA: Diagnosis not present

## 2021-04-16 DIAGNOSIS — E876 Hypokalemia: Secondary | ICD-10-CM | POA: Diagnosis not present

## 2021-04-16 DIAGNOSIS — D509 Iron deficiency anemia, unspecified: Secondary | ICD-10-CM | POA: Diagnosis not present

## 2021-04-16 DIAGNOSIS — Z992 Dependence on renal dialysis: Secondary | ICD-10-CM | POA: Diagnosis not present

## 2021-04-19 DIAGNOSIS — N2581 Secondary hyperparathyroidism of renal origin: Secondary | ICD-10-CM | POA: Diagnosis not present

## 2021-04-19 DIAGNOSIS — D509 Iron deficiency anemia, unspecified: Secondary | ICD-10-CM | POA: Diagnosis not present

## 2021-04-19 DIAGNOSIS — Z992 Dependence on renal dialysis: Secondary | ICD-10-CM | POA: Diagnosis not present

## 2021-04-19 DIAGNOSIS — E876 Hypokalemia: Secondary | ICD-10-CM | POA: Diagnosis not present

## 2021-04-19 DIAGNOSIS — D631 Anemia in chronic kidney disease: Secondary | ICD-10-CM | POA: Diagnosis not present

## 2021-04-19 DIAGNOSIS — N186 End stage renal disease: Secondary | ICD-10-CM | POA: Diagnosis not present

## 2021-04-20 ENCOUNTER — Telehealth: Payer: Self-pay | Admitting: Family Medicine

## 2021-04-20 DIAGNOSIS — E1122 Type 2 diabetes mellitus with diabetic chronic kidney disease: Secondary | ICD-10-CM | POA: Diagnosis not present

## 2021-04-20 DIAGNOSIS — N186 End stage renal disease: Secondary | ICD-10-CM | POA: Diagnosis not present

## 2021-04-20 DIAGNOSIS — Z992 Dependence on renal dialysis: Secondary | ICD-10-CM | POA: Diagnosis not present

## 2021-04-20 NOTE — Chronic Care Management (AMB) (Signed)
  Chronic Care Management   Note  04/20/2021 Name: Louis [name] MRN: [id number] DOB: 05/25/1946  Louis [name] is a 75 y.o. year old male who is a primary care patient of Metheney, Rene Kocher, MD. I reached out to Louis [name] by phone today in response to a referral sent by [name] [facility] PCP, [name], MD.   [name] was given information about Chronic Care Management services today including:  1. CCM service includes personalized support from designated clinical staff supervised by his physician, including individualized plan of care and coordination with other care providers 2. 24/7 contact phone numbers for assistance for urgent and routine care needs. 3. Service will only be billed when office clinical staff spend 20 minutes or more in a month to coordinate care. 4. Only one practitioner may furnish and bill the service in a calendar month. 5. The patient may stop CCM services at any time (effective at the end of the month) by phone call to the office staff.   Patient agreed to services and verbal consent obtained.   Follow up plan:   Lauretta Grill Upstream Scheduler

## 2021-04-21 ENCOUNTER — Telehealth: Payer: Self-pay | Admitting: *Deleted

## 2021-04-21 DIAGNOSIS — D631 Anemia in chronic kidney disease: Secondary | ICD-10-CM | POA: Diagnosis not present

## 2021-04-21 DIAGNOSIS — E876 Hypokalemia: Secondary | ICD-10-CM | POA: Diagnosis not present

## 2021-04-21 DIAGNOSIS — N2581 Secondary hyperparathyroidism of renal origin: Secondary | ICD-10-CM | POA: Diagnosis not present

## 2021-04-21 DIAGNOSIS — Z992 Dependence on renal dialysis: Secondary | ICD-10-CM | POA: Diagnosis not present

## 2021-04-21 DIAGNOSIS — E1122 Type 2 diabetes mellitus with diabetic chronic kidney disease: Secondary | ICD-10-CM | POA: Diagnosis not present

## 2021-04-21 DIAGNOSIS — D509 Iron deficiency anemia, unspecified: Secondary | ICD-10-CM | POA: Diagnosis not present

## 2021-04-21 DIAGNOSIS — N186 End stage renal disease: Secondary | ICD-10-CM | POA: Diagnosis not present

## 2021-04-21 NOTE — Telephone Encounter (Signed)
Medical records faxed confirmation received

## 2021-04-23 DIAGNOSIS — D509 Iron deficiency anemia, unspecified: Secondary | ICD-10-CM | POA: Diagnosis not present

## 2021-04-23 DIAGNOSIS — N186 End stage renal disease: Secondary | ICD-10-CM | POA: Diagnosis not present

## 2021-04-23 DIAGNOSIS — Z992 Dependence on renal dialysis: Secondary | ICD-10-CM | POA: Diagnosis not present

## 2021-04-23 DIAGNOSIS — D631 Anemia in chronic kidney disease: Secondary | ICD-10-CM | POA: Diagnosis not present

## 2021-04-23 DIAGNOSIS — E876 Hypokalemia: Secondary | ICD-10-CM | POA: Diagnosis not present

## 2021-04-23 DIAGNOSIS — N2581 Secondary hyperparathyroidism of renal origin: Secondary | ICD-10-CM | POA: Diagnosis not present

## 2021-04-26 DIAGNOSIS — N2581 Secondary hyperparathyroidism of renal origin: Secondary | ICD-10-CM | POA: Diagnosis not present

## 2021-04-26 DIAGNOSIS — Z992 Dependence on renal dialysis: Secondary | ICD-10-CM | POA: Diagnosis not present

## 2021-04-26 DIAGNOSIS — D509 Iron deficiency anemia, unspecified: Secondary | ICD-10-CM | POA: Diagnosis not present

## 2021-04-26 DIAGNOSIS — N186 End stage renal disease: Secondary | ICD-10-CM | POA: Diagnosis not present

## 2021-04-26 DIAGNOSIS — E876 Hypokalemia: Secondary | ICD-10-CM | POA: Diagnosis not present

## 2021-04-26 DIAGNOSIS — D631 Anemia in chronic kidney disease: Secondary | ICD-10-CM | POA: Diagnosis not present

## 2021-04-28 DIAGNOSIS — N186 End stage renal disease: Secondary | ICD-10-CM | POA: Diagnosis not present

## 2021-04-28 DIAGNOSIS — E876 Hypokalemia: Secondary | ICD-10-CM | POA: Diagnosis not present

## 2021-04-28 DIAGNOSIS — D509 Iron deficiency anemia, unspecified: Secondary | ICD-10-CM | POA: Diagnosis not present

## 2021-04-28 DIAGNOSIS — N2581 Secondary hyperparathyroidism of renal origin: Secondary | ICD-10-CM | POA: Diagnosis not present

## 2021-04-28 DIAGNOSIS — Z992 Dependence on renal dialysis: Secondary | ICD-10-CM | POA: Diagnosis not present

## 2021-04-28 DIAGNOSIS — D631 Anemia in chronic kidney disease: Secondary | ICD-10-CM | POA: Diagnosis not present

## 2021-04-30 DIAGNOSIS — Z992 Dependence on renal dialysis: Secondary | ICD-10-CM | POA: Diagnosis not present

## 2021-04-30 DIAGNOSIS — N2581 Secondary hyperparathyroidism of renal origin: Secondary | ICD-10-CM | POA: Diagnosis not present

## 2021-04-30 DIAGNOSIS — N186 End stage renal disease: Secondary | ICD-10-CM | POA: Diagnosis not present

## 2021-04-30 DIAGNOSIS — D509 Iron deficiency anemia, unspecified: Secondary | ICD-10-CM | POA: Diagnosis not present

## 2021-04-30 DIAGNOSIS — E876 Hypokalemia: Secondary | ICD-10-CM | POA: Diagnosis not present

## 2021-04-30 DIAGNOSIS — D631 Anemia in chronic kidney disease: Secondary | ICD-10-CM | POA: Diagnosis not present

## 2021-05-02 ENCOUNTER — Ambulatory Visit (INDEPENDENT_AMBULATORY_CARE_PROVIDER_SITE_OTHER): Payer: Medicare Other | Admitting: Family Medicine

## 2021-05-02 ENCOUNTER — Other Ambulatory Visit: Payer: Self-pay

## 2021-05-02 VITALS — BP 137/62 | HR 73

## 2021-05-02 DIAGNOSIS — I4891 Unspecified atrial fibrillation: Secondary | ICD-10-CM

## 2021-05-02 LAB — POCT INR: INR: 2.1 (ref 2.0–3.0)

## 2021-05-02 NOTE — Progress Notes (Signed)
See anticoagulation flowsheet. Recheck in 2 weeks.

## 2021-05-03 DIAGNOSIS — E876 Hypokalemia: Secondary | ICD-10-CM | POA: Diagnosis not present

## 2021-05-03 DIAGNOSIS — N186 End stage renal disease: Secondary | ICD-10-CM | POA: Diagnosis not present

## 2021-05-03 DIAGNOSIS — Z992 Dependence on renal dialysis: Secondary | ICD-10-CM | POA: Diagnosis not present

## 2021-05-03 DIAGNOSIS — D509 Iron deficiency anemia, unspecified: Secondary | ICD-10-CM | POA: Diagnosis not present

## 2021-05-03 DIAGNOSIS — D631 Anemia in chronic kidney disease: Secondary | ICD-10-CM | POA: Diagnosis not present

## 2021-05-03 DIAGNOSIS — N2581 Secondary hyperparathyroidism of renal origin: Secondary | ICD-10-CM | POA: Diagnosis not present

## 2021-05-03 NOTE — Progress Notes (Signed)
Left message advising of recommendations. Also for a call back to schedule on the nurse visit.

## 2021-05-05 DIAGNOSIS — E876 Hypokalemia: Secondary | ICD-10-CM | POA: Diagnosis not present

## 2021-05-05 DIAGNOSIS — N186 End stage renal disease: Secondary | ICD-10-CM | POA: Diagnosis not present

## 2021-05-05 DIAGNOSIS — D631 Anemia in chronic kidney disease: Secondary | ICD-10-CM | POA: Diagnosis not present

## 2021-05-05 DIAGNOSIS — D509 Iron deficiency anemia, unspecified: Secondary | ICD-10-CM | POA: Diagnosis not present

## 2021-05-05 DIAGNOSIS — E113213 Type 2 diabetes mellitus with mild nonproliferative diabetic retinopathy with macular edema, bilateral: Secondary | ICD-10-CM | POA: Diagnosis not present

## 2021-05-05 DIAGNOSIS — Z992 Dependence on renal dialysis: Secondary | ICD-10-CM | POA: Diagnosis not present

## 2021-05-05 DIAGNOSIS — H35353 Cystoid macular degeneration, bilateral: Secondary | ICD-10-CM | POA: Diagnosis not present

## 2021-05-05 DIAGNOSIS — N2581 Secondary hyperparathyroidism of renal origin: Secondary | ICD-10-CM | POA: Diagnosis not present

## 2021-05-07 DIAGNOSIS — Z992 Dependence on renal dialysis: Secondary | ICD-10-CM | POA: Diagnosis not present

## 2021-05-07 DIAGNOSIS — N186 End stage renal disease: Secondary | ICD-10-CM | POA: Diagnosis not present

## 2021-05-07 DIAGNOSIS — D631 Anemia in chronic kidney disease: Secondary | ICD-10-CM | POA: Diagnosis not present

## 2021-05-07 DIAGNOSIS — E876 Hypokalemia: Secondary | ICD-10-CM | POA: Diagnosis not present

## 2021-05-07 DIAGNOSIS — D509 Iron deficiency anemia, unspecified: Secondary | ICD-10-CM | POA: Diagnosis not present

## 2021-05-07 DIAGNOSIS — N2581 Secondary hyperparathyroidism of renal origin: Secondary | ICD-10-CM | POA: Diagnosis not present

## 2021-05-10 DIAGNOSIS — D631 Anemia in chronic kidney disease: Secondary | ICD-10-CM | POA: Diagnosis not present

## 2021-05-10 DIAGNOSIS — N2581 Secondary hyperparathyroidism of renal origin: Secondary | ICD-10-CM | POA: Diagnosis not present

## 2021-05-10 DIAGNOSIS — D509 Iron deficiency anemia, unspecified: Secondary | ICD-10-CM | POA: Diagnosis not present

## 2021-05-10 DIAGNOSIS — E876 Hypokalemia: Secondary | ICD-10-CM | POA: Diagnosis not present

## 2021-05-10 DIAGNOSIS — N186 End stage renal disease: Secondary | ICD-10-CM | POA: Diagnosis not present

## 2021-05-10 DIAGNOSIS — Z992 Dependence on renal dialysis: Secondary | ICD-10-CM | POA: Diagnosis not present

## 2021-05-12 DIAGNOSIS — N2581 Secondary hyperparathyroidism of renal origin: Secondary | ICD-10-CM | POA: Diagnosis not present

## 2021-05-12 DIAGNOSIS — E876 Hypokalemia: Secondary | ICD-10-CM | POA: Diagnosis not present

## 2021-05-12 DIAGNOSIS — N186 End stage renal disease: Secondary | ICD-10-CM | POA: Diagnosis not present

## 2021-05-12 DIAGNOSIS — D509 Iron deficiency anemia, unspecified: Secondary | ICD-10-CM | POA: Diagnosis not present

## 2021-05-12 DIAGNOSIS — D631 Anemia in chronic kidney disease: Secondary | ICD-10-CM | POA: Diagnosis not present

## 2021-05-12 DIAGNOSIS — Z992 Dependence on renal dialysis: Secondary | ICD-10-CM | POA: Diagnosis not present

## 2021-05-14 DIAGNOSIS — N2581 Secondary hyperparathyroidism of renal origin: Secondary | ICD-10-CM | POA: Diagnosis not present

## 2021-05-14 DIAGNOSIS — D509 Iron deficiency anemia, unspecified: Secondary | ICD-10-CM | POA: Diagnosis not present

## 2021-05-14 DIAGNOSIS — D631 Anemia in chronic kidney disease: Secondary | ICD-10-CM | POA: Diagnosis not present

## 2021-05-14 DIAGNOSIS — Z992 Dependence on renal dialysis: Secondary | ICD-10-CM | POA: Diagnosis not present

## 2021-05-14 DIAGNOSIS — E876 Hypokalemia: Secondary | ICD-10-CM | POA: Diagnosis not present

## 2021-05-14 DIAGNOSIS — N186 End stage renal disease: Secondary | ICD-10-CM | POA: Diagnosis not present

## 2021-05-17 DIAGNOSIS — D509 Iron deficiency anemia, unspecified: Secondary | ICD-10-CM | POA: Diagnosis not present

## 2021-05-17 DIAGNOSIS — N2581 Secondary hyperparathyroidism of renal origin: Secondary | ICD-10-CM | POA: Diagnosis not present

## 2021-05-17 DIAGNOSIS — E876 Hypokalemia: Secondary | ICD-10-CM | POA: Diagnosis not present

## 2021-05-17 DIAGNOSIS — N186 End stage renal disease: Secondary | ICD-10-CM | POA: Diagnosis not present

## 2021-05-17 DIAGNOSIS — Z992 Dependence on renal dialysis: Secondary | ICD-10-CM | POA: Diagnosis not present

## 2021-05-17 DIAGNOSIS — D631 Anemia in chronic kidney disease: Secondary | ICD-10-CM | POA: Diagnosis not present

## 2021-05-18 ENCOUNTER — Other Ambulatory Visit: Payer: Self-pay

## 2021-05-18 ENCOUNTER — Ambulatory Visit (INDEPENDENT_AMBULATORY_CARE_PROVIDER_SITE_OTHER): Payer: Medicare Other | Admitting: Osteopathic Medicine

## 2021-05-18 VITALS — BP 124/53 | HR 72

## 2021-05-18 DIAGNOSIS — I4891 Unspecified atrial fibrillation: Secondary | ICD-10-CM | POA: Diagnosis not present

## 2021-05-18 LAB — POCT INR: INR: 2.3 (ref 2.0–3.0)

## 2021-05-19 DIAGNOSIS — N2581 Secondary hyperparathyroidism of renal origin: Secondary | ICD-10-CM | POA: Diagnosis not present

## 2021-05-19 DIAGNOSIS — D631 Anemia in chronic kidney disease: Secondary | ICD-10-CM | POA: Diagnosis not present

## 2021-05-19 DIAGNOSIS — Z992 Dependence on renal dialysis: Secondary | ICD-10-CM | POA: Diagnosis not present

## 2021-05-19 DIAGNOSIS — N186 End stage renal disease: Secondary | ICD-10-CM | POA: Diagnosis not present

## 2021-05-19 DIAGNOSIS — E876 Hypokalemia: Secondary | ICD-10-CM | POA: Diagnosis not present

## 2021-05-19 DIAGNOSIS — D509 Iron deficiency anemia, unspecified: Secondary | ICD-10-CM | POA: Diagnosis not present

## 2021-05-20 DIAGNOSIS — E1122 Type 2 diabetes mellitus with diabetic chronic kidney disease: Secondary | ICD-10-CM | POA: Diagnosis not present

## 2021-05-20 DIAGNOSIS — Z992 Dependence on renal dialysis: Secondary | ICD-10-CM | POA: Diagnosis not present

## 2021-05-20 DIAGNOSIS — N186 End stage renal disease: Secondary | ICD-10-CM | POA: Diagnosis not present

## 2021-05-21 DIAGNOSIS — E876 Hypokalemia: Secondary | ICD-10-CM | POA: Diagnosis not present

## 2021-05-21 DIAGNOSIS — D509 Iron deficiency anemia, unspecified: Secondary | ICD-10-CM | POA: Diagnosis not present

## 2021-05-21 DIAGNOSIS — Z992 Dependence on renal dialysis: Secondary | ICD-10-CM | POA: Diagnosis not present

## 2021-05-21 DIAGNOSIS — D631 Anemia in chronic kidney disease: Secondary | ICD-10-CM | POA: Diagnosis not present

## 2021-05-21 DIAGNOSIS — N2581 Secondary hyperparathyroidism of renal origin: Secondary | ICD-10-CM | POA: Diagnosis not present

## 2021-05-21 DIAGNOSIS — E1122 Type 2 diabetes mellitus with diabetic chronic kidney disease: Secondary | ICD-10-CM | POA: Diagnosis not present

## 2021-05-21 DIAGNOSIS — N186 End stage renal disease: Secondary | ICD-10-CM | POA: Diagnosis not present

## 2021-05-24 DIAGNOSIS — D631 Anemia in chronic kidney disease: Secondary | ICD-10-CM | POA: Diagnosis not present

## 2021-05-24 DIAGNOSIS — Z992 Dependence on renal dialysis: Secondary | ICD-10-CM | POA: Diagnosis not present

## 2021-05-24 DIAGNOSIS — N186 End stage renal disease: Secondary | ICD-10-CM | POA: Diagnosis not present

## 2021-05-24 DIAGNOSIS — E876 Hypokalemia: Secondary | ICD-10-CM | POA: Diagnosis not present

## 2021-05-24 DIAGNOSIS — D509 Iron deficiency anemia, unspecified: Secondary | ICD-10-CM | POA: Diagnosis not present

## 2021-05-24 DIAGNOSIS — N2581 Secondary hyperparathyroidism of renal origin: Secondary | ICD-10-CM | POA: Diagnosis not present

## 2021-05-26 DIAGNOSIS — N186 End stage renal disease: Secondary | ICD-10-CM | POA: Diagnosis not present

## 2021-05-26 DIAGNOSIS — D631 Anemia in chronic kidney disease: Secondary | ICD-10-CM | POA: Diagnosis not present

## 2021-05-26 DIAGNOSIS — N2581 Secondary hyperparathyroidism of renal origin: Secondary | ICD-10-CM | POA: Diagnosis not present

## 2021-05-26 DIAGNOSIS — E876 Hypokalemia: Secondary | ICD-10-CM | POA: Diagnosis not present

## 2021-05-26 DIAGNOSIS — Z992 Dependence on renal dialysis: Secondary | ICD-10-CM | POA: Diagnosis not present

## 2021-05-26 DIAGNOSIS — D509 Iron deficiency anemia, unspecified: Secondary | ICD-10-CM | POA: Diagnosis not present

## 2021-05-28 DIAGNOSIS — N186 End stage renal disease: Secondary | ICD-10-CM | POA: Diagnosis not present

## 2021-05-28 DIAGNOSIS — D509 Iron deficiency anemia, unspecified: Secondary | ICD-10-CM | POA: Diagnosis not present

## 2021-05-28 DIAGNOSIS — Z992 Dependence on renal dialysis: Secondary | ICD-10-CM | POA: Diagnosis not present

## 2021-05-28 DIAGNOSIS — D631 Anemia in chronic kidney disease: Secondary | ICD-10-CM | POA: Diagnosis not present

## 2021-05-28 DIAGNOSIS — N2581 Secondary hyperparathyroidism of renal origin: Secondary | ICD-10-CM | POA: Diagnosis not present

## 2021-05-28 DIAGNOSIS — E876 Hypokalemia: Secondary | ICD-10-CM | POA: Diagnosis not present

## 2021-05-31 ENCOUNTER — Telehealth: Payer: Self-pay | Admitting: Pharmacist

## 2021-05-31 DIAGNOSIS — D631 Anemia in chronic kidney disease: Secondary | ICD-10-CM | POA: Diagnosis not present

## 2021-05-31 DIAGNOSIS — E876 Hypokalemia: Secondary | ICD-10-CM | POA: Diagnosis not present

## 2021-05-31 DIAGNOSIS — D509 Iron deficiency anemia, unspecified: Secondary | ICD-10-CM | POA: Diagnosis not present

## 2021-05-31 DIAGNOSIS — Z992 Dependence on renal dialysis: Secondary | ICD-10-CM | POA: Diagnosis not present

## 2021-05-31 DIAGNOSIS — N2581 Secondary hyperparathyroidism of renal origin: Secondary | ICD-10-CM | POA: Diagnosis not present

## 2021-05-31 DIAGNOSIS — N186 End stage renal disease: Secondary | ICD-10-CM | POA: Diagnosis not present

## 2021-05-31 NOTE — Chronic Care Management (AMB) (Signed)
Chronic Care Management Pharmacy Assistant   Name: Louis [name]  MRN: [id number] DOB: [birth year]  Louis [name] is an 75 y.o. year old male who presents for his initial CCM visit with the clinical pharmacist.  Reason for Encounter: Initial CCM Visit    Recent office visits:  03/21/21 Louis Ped, MD - Seen for atrial fibrillation. Labs done. Return in 1 month for INR lab. Follow up as needed.  03/18/2021 Louis Lecher, MD - Seen for general follow up. Decrease Novolin to 27 units in the morning. Start Diflucan every other day. Labs ordered. Follow up in 3 months.  03/14/2021 Louis Lecher, MD - Medicare Wellness Visit  03/07/2021 Louis Mems, MD - Seen for follow up of left humerus fracture. MRI today. Follow up in 2 weeks.  02/28/2021 (TE) Louis Bouche, NP - Patient called states he was seen for rash without improvement. Start Diflucan 200 mg once weekly for 4 weeks.   02/16/2021 Louis Bouche, NP - Seen for rash in groin area. Started Clotrimazole 1% cream twice daily. Started Triamcinolone ointment 0.5% as needed. Follow up as needed.  12/29/2020 Louis Lecher, MD - Seen for INR check. Patient experienced nausea during visit. Blood sugar checked. Return as needed.  12/17/2020 Louis Lecher, MD - Seen for general follow up. Decreased Novolin. Increased Lyrica to 50 mg once daily. Tapered off Amitriptyline. Labs ordered.Follow up in 3 months.  Recent consult visits:  05/13/21 Louis [name] Nephrology - Seen for end stage renal disease. No notes available.  02/22/2021 Louis Rusk MD (Gastroenterology) Seen for change in stool caliber. Colonoscopy ordered and scheduled. Instructions and prescription for prep given.   12/29/2020 Louis Ruths, MD (Cardiology) Seen for general follow up . Follow up in 6 months.  12/27/2020 Louis Score, MD (Orthopedic Surgery) Seen for right foot follow up. Prescription for custom orthotics provided. Follow  up as needed.  12/20/2020 Louis Baglia, PA-C (Vascular Surgery) Seen for post op follow up. Follow up as needed.  12/18/2020 Louis [name] (Nephrology) - Seen for end stage renal disease. No notes available.  12/16/2020 Louis [name] (Nephrology) - Seen for end stage renal disease. No notes available.  12/14/2020 Louis [name] (Nephrology) - Seen for end stage renal disease. No notes available.  12/11/2020 Louis [name] (Nephrology) - Seen for end stage renal disease. No notes available.  12/09/2020 Louis [name] (Nephrology) - Seen for end stage renal disease. No notes available.  12/07/2020 Louis [name] (Nephrology) - Seen for end stage renal disease. No notes available.  12/04/2020 Louis [name] (Nephrology) - Seen for end stage renal disease. No notes available.  12/02/2020 Louis [name] (Nephrology) - Seen for end stage renal disease. No notes available.   Hospital visits:  None in previous 6 months  Medications: Outpatient Encounter Medications as of 05/31/2021  Medication Sig   allopurinol (ZYLOPRIM) 100 MG tablet Take 1 tablet (100 mg total) by mouth daily.   AMBULATORY NON FORMULARY MEDICATION Continuous positive airway pressure (CPAP) device: Auto titrate minimum 4 cm H20 to maximum of 20 cm H2O with pressure. Please provide all supplemental supplies as needed. Fax to: [phone]   AMBULATORY NON FORMULARY MEDICATION CPAP supplies - Disposable filters (white), Replaceable pillow or cushions, Mask and Tubing as needed.   amLODipine (NORVASC) 2.5 MG tablet Take 1 tablet (2.5 mg total) by mouth daily.   B-D INS SYR ULTRAFINE 1CC/31G 31G X 5/16" 1 ML MISC    blood glucose meter kit and supplies Use to check blood sugars daily  E11.65   Cholecalciferol (VITAMIN D3) 50 MCG (2000 UT) TABS Take 2,000 Units by mouth daily.    ciclopirox (LOPROX) 0.77 % cream Apply topically 2 (two) times daily. X 3 weeks   Coenzyme Q10 (COQ10) 200  MG CAPS Take 200 mg by mouth daily.   Continuous Blood Gluc Receiver (FREESTYLE LIBRE 14 DAY READER) DEVI Dx DM E11.22 Check blood sugar 4 times daily.   Continuous Blood Gluc Sensor (FREESTYLE LIBRE 14 DAY SENSOR) MISC Dx DM E11.22 Check blood sugar 4 times daily.   doxercalciferol (HECTOROL) 4 MCG/2ML injection Doxercalciferol (Hectorol)   fluconazole (DIFLUCAN) 200 MG tablet Take 200 mg by mouth. Once a week.   furosemide (LASIX) 80 MG tablet Take 2 tablets (160 mg total) by mouth 2 (two) times daily. Morning & after lunch   gabapentin (NEURONTIN) 300 MG capsule Take 1 capsule by mouth daily.   hydrALAZINE (APRESOLINE) 100 MG tablet TAKE 1 TABLET BY MOUTH THREE TIMES DAILY   isosorbide dinitrate (ISORDIL) 20 MG tablet Take 1 tablet (20 mg total) by mouth 2 (two) times daily.   lidocaine-prilocaine (EMLA) cream SMARTSIG:Sparingly Topical   Methoxy PEG-Epoetin Beta (MIRCERA IJ) Mircera   nitroGLYCERIN (NITROSTAT) 0.4 MG SL tablet Place 1 tablet (0.4 mg total) under the tongue every 5 (five) minutes as needed for chest pain.   NOVOLIN N 100 UNIT/ML injection Inject 20-30 Units into the skin See admin instructions. Inject 30 units subcutaneously in the morning & inject 20-30 units subcutaneously at bedtime (depending on blood sugar)   NOVOLIN R 100 UNIT/ML injection Inject 8-20 Units into the skin 3 (three) times daily with meals. Sliding Scale   omega-3 acid ethyl esters (LOVAZA) 1 g capsule Take 2 g by mouth daily.    ONETOUCH ULTRA test strip USE 1 STRIP TO CHECK GLUCOSE 3 TIMES DAILY. E11.22   Probiotic Product (PROBIOTIC DAILY PO) Take 420 mg by mouth daily.   rosuvastatin (CRESTOR) 40 MG tablet Take 1 tablet by mouth once daily   triamcinolone ointment (KENALOG) 0.5 % Apply 1 application topically 2 (two) times daily. To affected area, avoid eyes and face   Vitamin Mixture (VITAMIN E COMPLETE PO) Take 1 capsule by mouth daily. Vitamin E complex   warfarin (COUMADIN) 5 MG tablet TAKE 1 TABLET  BY MOUTH ONCE DAILY AT  6PM  MONITOR  INR  GOAL  SHOULD  BE  BETWEEN  2-3 (Patient taking differently: TAKE 1 TABLET BY MOUTH ONCE DAILY AT  6PM  MONITOR  INR  GOAL  SHOULD  BE  BETWEEN  2-3  Per his wife, he is currently taking 2.5 mg Tuesday and Friday. 9m on the other days)   ZINC SULFATE PO Take 1 tablet by mouth daily.    Facility-Administered Encounter Medications as of 05/31/2021  Medication   0.9 %  sodium chloride infusion    Medications allopurinol (ZYLOPRIM) 100 MG tablet last filled 09/12/2020 90 DS amLODipine (NORVASC) 2.5 MG tablet last filled 03/28/2021 90 DS Cholecalciferol (VITAMIN D3) 50 MCG (2000 UT) TABS ciclopirox (LOPROX) 0.77 % cream last filled 03/18/2021 15 DS Coenzyme Q10 (COQ10) 200 MG CAPS doxercalciferol (HECTOROL) 4 MCG/2ML injection fluconazole (DIFLUCAN) 200 MG tablet last filled 02/28/2021 28 DS furosemide (LASIX) 80 MG tablet last filled 04/10/2021 90 DS gabapentin (NEURONTIN) 300 MG capsule last filled 03/03/2021 90 DS hydrALAZINE (APRESOLINE) 100 MG tablet isosorbide dinitrate (ISORDIL) 20 MG tablet last filled 01/01/2021 90 DS lidocaine-prilocaine (EMLA) cream Methoxy PEG-Epoetin Beta (MIRCERA IJ) nitroGLYCERIN (NITROSTAT) 0.4 MG SL  tablet last filled 12/29/2020 30 DS NOVOLIN N 100 UNIT/ML injection last filled 11/25/2019 NOVOLIN R 100 UNIT/ML injection last filled 11/25/2019 30 DS omega-3 acid ethyl esters (LOVAZA) 1 g capsule Probiotic Product (PROBIOTIC DAILY PO) rosuvastatin (CRESTOR) 40 MG tablet last filled 05/06/2021 90 DS triamcinolone ointment (KENALOG) 0.5 % last filled 02/16/2021 15 DS warfarin (COUMADIN) 5 MG tablet last filled 05/21/2021 90 DS ZINC SULFATE PO   Star Rating Drugs: rosuvastatin (CRESTOR) 40 MG tablet last filled 05/06/2021 90 DS  [place] Pharmacist Assistant [phone]

## 2021-06-02 DIAGNOSIS — E113213 Type 2 diabetes mellitus with mild nonproliferative diabetic retinopathy with macular edema, bilateral: Secondary | ICD-10-CM | POA: Diagnosis not present

## 2021-06-02 DIAGNOSIS — H3589 Other specified retinal disorders: Secondary | ICD-10-CM | POA: Diagnosis not present

## 2021-06-02 DIAGNOSIS — N2581 Secondary hyperparathyroidism of renal origin: Secondary | ICD-10-CM | POA: Diagnosis not present

## 2021-06-02 DIAGNOSIS — D631 Anemia in chronic kidney disease: Secondary | ICD-10-CM | POA: Diagnosis not present

## 2021-06-02 DIAGNOSIS — H35373 Puckering of macula, bilateral: Secondary | ICD-10-CM | POA: Diagnosis not present

## 2021-06-02 DIAGNOSIS — E876 Hypokalemia: Secondary | ICD-10-CM | POA: Diagnosis not present

## 2021-06-02 DIAGNOSIS — D509 Iron deficiency anemia, unspecified: Secondary | ICD-10-CM | POA: Diagnosis not present

## 2021-06-02 DIAGNOSIS — N186 End stage renal disease: Secondary | ICD-10-CM | POA: Diagnosis not present

## 2021-06-02 DIAGNOSIS — Z992 Dependence on renal dialysis: Secondary | ICD-10-CM | POA: Diagnosis not present

## 2021-06-03 DIAGNOSIS — N186 End stage renal disease: Secondary | ICD-10-CM | POA: Diagnosis not present

## 2021-06-03 DIAGNOSIS — Z992 Dependence on renal dialysis: Secondary | ICD-10-CM | POA: Diagnosis not present

## 2021-06-03 DIAGNOSIS — E876 Hypokalemia: Secondary | ICD-10-CM | POA: Diagnosis not present

## 2021-06-03 DIAGNOSIS — N2581 Secondary hyperparathyroidism of renal origin: Secondary | ICD-10-CM | POA: Diagnosis not present

## 2021-06-03 DIAGNOSIS — D631 Anemia in chronic kidney disease: Secondary | ICD-10-CM | POA: Diagnosis not present

## 2021-06-03 DIAGNOSIS — D509 Iron deficiency anemia, unspecified: Secondary | ICD-10-CM | POA: Diagnosis not present

## 2021-06-06 ENCOUNTER — Telehealth: Payer: Medicare Other

## 2021-06-06 NOTE — Progress Notes (Deleted)
Current Barriers:  {PHARMCACYBARRIERS:21091514}  Pharmacist Clinical Goal(s):  Over the next *** days, patient will {PHARMACYGOALCHOICES:25079} through collaboration with PharmD and provider.   Interventions: 1:1 collaboration with [name], MD regarding development and update of comprehensive plan of care as evidenced by provider attestation and co-signature Inter-disciplinary care team collaboration (see longitudinal plan of care) Comprehensive medication review performed; medication list updated in electronic medical record  Diabetes:  Uncontrolled/controlled; current treatment:***;   Current glucose readings: fasting glucose: ***, post prandial glucose: ***  Denies/reports hypoglycemic/hyperglycemic symptoms  Current meal patterns: breakfast: ***; lunch: ***; dinner: ***; snacks: ***; drinks: ***  Current exercise: ***  {PHARMACYINTERVENTION:21091513},  Hypertension:  Uncontrolled/controlled; current treatment:***;   Current home readings:   Denies/reports hypotensive/hypertensive symptoms  {PHARMACYINTERVENTION:21091513},  Hyperlipidemia:  Uncontrolled/controlled; current treatment:***;   Medications previously tried: ***   Current dietary patterns: ***  {PHARMACYINTERVENTION:21091513} Atrial Fibrillation:  Uncontrolled/controlled; current rate/rhythm control***; anticoagulant treatment: ***  CHADS2VASc score:   Home blood pressure, heart rate readings:   {PHARMACYINTERVENTION:21091513}  Patient Goals/Self-Care Activities Over the next *** days, patient will:  {PHARMACYPATIENTGOALS:25081}  Follow Up Plan: {CM FOLLOW UP ONGE:95284} ***     Chronic Care Management Pharmacy Note  06/06/2021 Name:  Louis [name] MRN:  [id number] DOB:  1946-06-15  Summary:  Recommendations/Changes made from today's visit:  Plan:  Subjective: Louis [name] is an 75 y.o. year old male who is a primary patient of Metheney, Rene Kocher, MD.  The CCM team was consulted  for assistance with disease management and care coordination needs.    {CCMTELEPHONEFACETOFACE:21091510} for {CCMINITIALFOLLOWUPCHOICE:21091511} in response to provider referral for pharmacy case management and/or care coordination services.   Consent to Services:  {CCMCONSENTOPTIONS:25074}  Patient Care Team: [name], MD as PCP - General Stanford Breed Denice Bors, MD as PCP - Cardiology (Cardiology) Lonna Duval, MD as Referring Physician (Endocrinology) Gerarda Fraction, MD as Referring Physician (Ophthalmology) Lyndee Hensen (Chiropractic Medicine) Justin Mend, MD as Consulting Physician (Nephrology) Center, [facility], Felecia Shelling, [facility] as Pharmacist (Pharmacist)  Recent office visits:  03/21/21 [name], MD - Seen for atrial fibrillation. Labs done. Return in 1 month for INR lab. Follow up as needed.   03/18/2021 [name], MD - Seen for general follow up. Decrease Novolin to 27 units in the morning. Start Diflucan every other day. Labs ordered. Follow up in 3 months.   03/14/2021 [name], MD - Medicare Wellness Visit   03/07/2021 [name], MD - Seen for follow up of left humerus fracture. MRI today. Follow up in 2 weeks.   02/28/2021 (TE) [name], NP - Patient called states he was seen for rash without improvement. Start Diflucan 200 mg once weekly for 4 weeks.   02/16/2021 [name], NP - Seen for rash in groin area. Started Clotrimazole 1% cream twice daily. Started Triamcinolone ointment 0.5% as needed. Follow up as needed.   12/29/2020 [name], MD - Seen for INR check. Patient experienced nausea during visit. Blood sugar checked. Return as needed.   12/17/2020 [name], MD - Seen for general follow up. Decreased Novolin. Increased Lyrica to 50 mg once daily. Tapered off Amitriptyline. Labs ordered.Follow up in 3 months.   Recent consult visits:  05/13/21 [name] -  Seen for end stage renal disease. No notes available.   02/22/2021 [name] MD (Gastroenterology) Seen for change in stool caliber. Colonoscopy ordered and scheduled. Instructions and prescription for prep given.   12/29/2020 [name], MD (Cardiology) Seen  for general follow up . Follow up in 6 months.   12/27/2020 [name], MD (Orthopedic Surgery) Seen for right foot follow up. Prescription for custom orthotics provided. Follow up as needed.   12/20/2020 [name], PA-C (Vascular Surgery) Seen for post op follow up. Follow up as needed.   12/18/2020 [name] (Nephrology) - Seen for end stage renal disease. No notes available.    Hospital visits:  None in previous 6 months  Objective:  Lab Results  Component Value Date   CREATININE 3.90 (H) 10/27/2020   CREATININE 3.50 (H) 09/01/2020   CREATININE 3.66 (H) 10/27/2019    Lab Results  Component Value Date   HGBA1C 6.6 (A) 03/18/2021   Last diabetic Eye exam:  Lab Results  Component Value Date/Time   HMDIABEYEEXA Retinopathy (A) 12/30/2020 12:00 AM    Last diabetic Foot exam: No results found for: HMDIABFOOTEX      Component Value Date/Time   CHOL 118 05/28/2020 1026   TRIG 161 (H) 05/28/2020 1026   HDL 38 (L) 05/28/2020 1026   CHOLHDL 3.1 05/28/2020 1026   VLDL 28 04/11/2016 1048   LDLCALC 56 05/28/2020 1026    Hepatic Function Latest Ref Rng & Units 09/09/2020 05/28/2020 10/27/2019  Total Protein 6.1 - 8.1 g/dL - 6.2 6.7  Albumin 3.5 - 5.0 2.9(A) - 3.1(L)  AST 10 - 35 U/L - 23 31  ALT 9 - 46 U/L - 17 19  Alk Phosphatase 38 - 126 U/L - - 105  Total Bilirubin 0.2 - 1.2 mg/dL - 0.8 1.1  Bilirubin, Direct 0.0 - 0.2 mg/dL - 0.2 -    Lab Results  Component Value Date/Time   TSH 2.391 02/02/2017 03:21 PM   TSH 1.85 11/27/2016 01:32 PM   TSH 1.66 02/17/2016 09:46 AM    CBC Latest Ref Rng & Units 10/27/2020 09/09/2020 09/01/2020  WBC - - 11.4 -  Hemoglobin 13.0 - 17.0 g/dL 12.9(L) 11.3(A)  14.6  Hematocrit 39.0 - 52.0 % 38.0(L) 35(A) 43.0  Platelets 140 - 400 Thousand/uL - - -    Lab Results  Component Value Date/Time   VD25OH 55 02/17/2016 09:46 AM    Clinical ASCVD: {YES/NO:21197} The ASCVD Risk score Mikey Bussing DC Jr., et al., 2013) failed to calculate for the following reasons:   The valid total cholesterol range is 130 to 320 mg/dL    Other: (CHADS2VASc if Afib, PHQ9 if depression, MMRC or CAT for COPD, ACT, DEXA)  Social History   Tobacco Use  Smoking Status Former   Packs/day: 0.50   Years: 20.00   Pack years: 10.00   Types: Cigarettes   Quit date: 03/06/1977   Years since quitting: 44.2  Smokeless Tobacco Never   BP Readings from Last 3 Encounters:  05/18/21 (!) 124/53  05/02/21 137/62  04/04/21 139/66   Pulse Readings from Last 3 Encounters:  05/18/21 72  05/02/21 73  04/04/21 72   Wt Readings from Last 3 Encounters:  03/14/21 235 lb 6.4 oz (106.8 kg)  03/02/21 231 lb (104.8 kg)  02/22/21 231 lb (104.8 kg)    Assessment: Review of patient past medical history, allergies, medications, health status, including review of consultants reports, laboratory and other test data, was performed as part of comprehensive evaluation and provision of chronic care management services.   SDOH:  (Social Determinants of Health) assessments and interventions performed:    CCM Care Plan  Allergies  Allergen Reactions   Hydrocodone Nausea And Vomiting    Other reaction(s):  GI Upset (intolerance) Projectile vomiting    Oxycodone Nausea And Vomiting    Other reaction(s): GI Upset (intolerance), Vomiting (intolerance) Projectile vomiting    Dacarbazine Other (See Comments)    Unknown reaction   Tape Dermatitis, Itching and Rash    Patch used at dialysis    Medications Reviewed Today     Reviewed by Narda Rutherford, CMA (Certified Medical Assistant) on 05/18/21 at [place] List Status: <None>   Medication Order Taking? Sig Documenting Provider Last  Dose Status Informant  0.9 %  sodium chloride infusion [id number]   Gatha Mayer, MD  Active   allopurinol (ZYLOPRIM) 100 MG tablet [id number] Yes Take 1 tablet (100 mg total) by mouth daily. [name], MD Taking Active   AMBULATORY NON [facility] MEDICATION [id number] Yes Continuous positive airway pressure (CPAP) device: Auto titrate minimum 4 cm H20 to maximum of 20 cm H2O with pressure. Please provide all supplemental supplies as needed. Fax to: [phone] Emeterio Reeve, DO Taking Active Spouse/Significant Other  AMBULATORY NON FORMULARY MEDICATION [id number] Yes CPAP supplies - Disposable filters (white), Replaceable pillow or cushions, Mask and Tubing as needed. [name], MD Taking Active   amLODipine (NORVASC) 2.5 MG tablet [id number] Yes Take 1 tablet (2.5 mg total) by mouth daily. Lelon Perla, MD Taking Active   B-D INS SYR ULTRAFINE 1CC/31G 31G X 5/16" 1 ML MISC [id number] Yes  [provider] Taking Active Spouse/Significant Other  blood glucose meter kit and supplies [id number] Yes Use to check blood sugars daily E11.65 [provider] Taking Active Spouse/Significant Other  Cholecalciferol (VITAMIN D3) 50 MCG (2000 UT) TABS [id number] Yes Take 2,000 Units by mouth daily.  [provider] Taking Active Spouse/Significant Other  ciclopirox (LOPROX) 0.77 % cream [id number] Yes Apply topically 2 (two) times daily. X 3 weeks [name], MD Taking Active   Coenzyme Q10 (COQ10) 200 MG CAPS [id number] Yes Take 200 mg by mouth daily. [provider] Taking Active Spouse/Significant Other  Continuous Blood Gluc Receiver (FREESTYLE LIBRE 14 DAY READER) [place] [id number] Yes Dx DM E11.22 Check blood sugar 4 times daily. [name], MD Taking Active Spouse/Significant Other  Continuous Blood Gluc Sensor (FREESTYLE LIBRE 14 DAY SENSOR) [place] [id number] Yes Dx DM E11.22 Check blood sugar 4 times daily. [name],  MD Taking Active Spouse/Significant Other  doxercalciferol (HECTOROL) 4 MCG/2ML injection [id number] Yes Doxercalciferol (Hectorol) [provider] Taking Active Spouse/Significant Other  fluconazole (DIFLUCAN) 200 MG tablet [id number] Yes Take 200 mg by mouth. Once a week. [provider] Taking Active   furosemide (LASIX) 80 MG tablet [id number] Yes Take 2 tablets (160 mg total) by mouth 2 (two) times daily. Morning & after lunch [name], MD Taking Active   gabapentin (NEURONTIN) 300 MG capsule [id number] Yes Take 1 capsule by mouth daily. [provider] Taking Active   hydrALAZINE (APRESOLINE) 100 MG tablet [id number] Yes TAKE 1 TABLET BY MOUTH THREE TIMES DAILY Crenshaw, Denice Bors, MD Taking Active   isosorbide dinitrate (ISORDIL) 20 MG tablet [id number] Yes Take 1 tablet (20 mg total) by mouth 2 (two) times daily. Lelon Perla, MD Taking Active   lidocaine-prilocaine (EMLA) cream [id number] Yes SMARTSIG:Sparingly Topical [provider] Taking Active   Methoxy PEG-Epoetin Angus Seller) [id number] Yes Mircera [provider] Taking Active   nitroGLYCERIN (NITROSTAT) 0.4 MG SL tablet [id number] Yes Place 1 tablet (0.4 mg total) under the tongue every 5 (five) minutes as needed  for chest pain. Lelon Perla, MD Taking Active   NOVOLIN N 100 UNIT/ML injection [id number] Yes Inject 20-30 Units into the skin See admin instructions. Inject 30 units subcutaneously in the morning & inject 20-30 units subcutaneously at bedtime (depending on blood sugar) [provider] Taking Active Spouse/Significant Other           Med Note Kenton Kingfisher, Earley Favor   Wed Oct 20, 2020  2:12 PM)    NOVOLIN R 100 UNIT/ML injection [id number] Yes Inject 8-20 Units into the skin 3 (three) times daily with meals. Sliding Scale [provider] Taking Active Spouse/Significant Other           Med Note Nash Mantis, TIFFANI S   Fri Feb 02, 2017 11:53 AM)     omega-3 acid ethyl esters (LOVAZA) 1 g capsule [id number] Yes Take 2 g by mouth daily.  [provider] Taking Active Spouse/Significant Other  ONETOUCH ULTRA test strip [id number] Yes USE 1 STRIP TO CHECK GLUCOSE 3 TIMES DAILY. E11.22 [name], MD Taking Active Spouse/Significant Other  Probiotic Product (PROBIOTIC DAILY PO) [id number] Yes Take 420 mg by mouth daily. [provider] Taking Active Spouse/Significant Other  rosuvastatin (CRESTOR) 40 MG tablet [id number] Yes Take 1 tablet by mouth once daily Lelon Perla, MD Taking Active   triamcinolone ointment (KENALOG) 0.5 % [id number] Yes Apply 1 application topically 2 (two) times daily. To affected area, avoid eyes and face [name], NP Taking Active   Vitamin Mixture (VITAMIN E COMPLETE PO) [id number] Yes Take 1 capsule by mouth daily. Vitamin E complex [provider] Taking Active Spouse/Significant Other  warfarin (COUMADIN) 5 MG tablet [id number] Yes TAKE 1 TABLET BY MOUTH ONCE DAILY AT  6PM  MONITOR  INR  GOAL  SHOULD  BE  BETWEEN  2-3  Patient taking differently: TAKE 1 TABLET BY MOUTH ONCE DAILY AT  6PM  MONITOR  INR  GOAL  SHOULD  BE  BETWEEN  2-3  Per his wife, he is currently taking 2.5 mg Tuesday and Friday. 23m on the other days   MHali Marry MD Taking Active Spouse/Significant Other  ZINC SULFATE PO [id number] Take 1 tablet by mouth daily.  [provider] Taking Active Spouse/Significant Other            Patient Active Problem List   Diagnosis Date Noted   Gait instability 11/17/2020   Allergy, unspecified, initial encounter 07/14/2020   Trigger finger, right middle finger 02/03/2020   Pain in both hands 02/03/2020   Anticoagulant long-term use 01/06/2020   Iron deficiency anemia, unspecified 09/01/2019   Unspecified jaundice 08/27/2019   Other long term (current) drug therapy 08/27/2019   Liver disease, unspecified 08/27/2019   Internal hemorrhoids  06/05/2019   Poor mobility 05/02/2019   Hypertensive left ventricular hypertrophy, without heart failure 05/02/2019   Secondary hyperparathyroidism of renal origin ([place] 01/25/2019   Atrial fibrillation ([place] 12/23/2018   Other specified coagulation defects ([place] 12/23/2018   Mechanical complication of cardiovascular device 12/23/2018   Anemia in chronic kidney disease 12/20/2018   Anasarca 12/14/2018   SOB (shortness of breath) on exertion 12/12/2018   Bilateral pseudophakia 02/27/2018   Venous insufficiency (chronic) (peripheral) 09/06/2017   Diabetic peripheral neuropathy ([facility] 06/12/2017   Charcot gait 02/02/2017   Charcot's joint 02/02/2017   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 ([facility] 01/08/2017   Diabetic polyneuropathy associated with type 2 diabetes mellitus ([facility] 12/25/2016   Idiopathic chronic venous hypertension  of both lower extremities with inflammation 12/25/2016   Combined forms of age-related cataract of both eyes 10/19/2016   Cataract, right 05/18/2016   Right cervical radiculopathy 05/12/2016   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 ([place]) 10/12/2015   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 06/17/2015   Gout 04/20/2015   Osteoarthritis of both acromioclavicular joints 12/03/2014   Fracture of humerus, left, closed 11/17/2014   Unstable angina pectoris ([place]) 10/05/2014   History of colonic polyps 01/28/2014   Bilateral carpal tunnel syndrome 01/09/2014   Macular edema, diabetic ([place]) 10/31/2013   Obesity (BMI 30-39.9) 07/31/2013   Astigmatism 01/11/2012   Presbyopia 01/11/2012   Atherosclerosis of native coronary artery of native heart with angina pectoris ([place]) 05/17/2011   BMI 32.0-32.9,adult 03/01/2011   Bradycardia 02/08/2011   Right knee pain 02/08/2011   Hyperlipidemia    RBBB 02/01/2011   ESRD (end stage renal disease) ([place]) 10/23/2006   Proteinuria  10/23/2006   Obstructive sleep apnea 10/05/2006   CKD stage 5 due to type 2 diabetes mellitus ([place]) 08/28/2006   HYPERTENSION, BENIGN SYSTEMIC 08/28/2006    Immunization History  Administered Date(s) Administered   Hep A / Hep B 11/21/2017, 12/24/2017, 04/29/2019   Hepb-cpg 09/14/2020, 10/07/2020, 11/04/2020, 03/10/2021   Influenza Split 08/29/2012   Influenza Whole 10/16/2005, 09/19/2007   Influenza,inj,Quad PF,6+ Mos 07/31/2013, 10/13/2014, 08/12/2015   Pneumococcal Conjugate-13 04/27/2015, 03/12/2019   Pneumococcal Polysaccharide-23 08/10/2006, 12/12/2016, 06/18/2019   Td 10/16/2005   Tdap 02/17/2016    Conditions to be addressed/monitored: {CCM ASSESSMENT DISEASE OPTIONS:25047}  There are no care plans that you recently modified to display for this patient.   Medication Assistance: {MEDASSISTANCEINFO:25044}  Patient's preferred pharmacy is:  [facility] [address], [place] [address] Phone: [phone] Fax: [phone]  Uses pill box? {Yes or If no, why not?:20788} Pt endorses ***% compliance  Follow Up:  {FOLLOWUP:24991}  Plan: {CM FOLLOW UP PLAN:25073}  SIG***

## 2021-06-07 DIAGNOSIS — D631 Anemia in chronic kidney disease: Secondary | ICD-10-CM | POA: Diagnosis not present

## 2021-06-07 DIAGNOSIS — N186 End stage renal disease: Secondary | ICD-10-CM | POA: Diagnosis not present

## 2021-06-07 DIAGNOSIS — D509 Iron deficiency anemia, unspecified: Secondary | ICD-10-CM | POA: Diagnosis not present

## 2021-06-07 DIAGNOSIS — Z992 Dependence on renal dialysis: Secondary | ICD-10-CM | POA: Diagnosis not present

## 2021-06-07 DIAGNOSIS — E876 Hypokalemia: Secondary | ICD-10-CM | POA: Diagnosis not present

## 2021-06-07 DIAGNOSIS — N2581 Secondary hyperparathyroidism of renal origin: Secondary | ICD-10-CM | POA: Diagnosis not present

## 2021-06-08 ENCOUNTER — Ambulatory Visit (INDEPENDENT_AMBULATORY_CARE_PROVIDER_SITE_OTHER): Payer: Medicare Other | Admitting: Family Medicine

## 2021-06-08 ENCOUNTER — Other Ambulatory Visit: Payer: Self-pay

## 2021-06-08 VITALS — BP 120/67 | HR 63

## 2021-06-08 DIAGNOSIS — I4891 Unspecified atrial fibrillation: Secondary | ICD-10-CM | POA: Diagnosis not present

## 2021-06-08 LAB — POCT INR: INR: 2.1 (ref 2.0–3.0)

## 2021-06-08 NOTE — Progress Notes (Signed)
See anticoagulation flowsheet. 2 week recheck.

## 2021-06-09 DIAGNOSIS — E1122 Type 2 diabetes mellitus with diabetic chronic kidney disease: Secondary | ICD-10-CM | POA: Diagnosis not present

## 2021-06-09 DIAGNOSIS — D509 Iron deficiency anemia, unspecified: Secondary | ICD-10-CM | POA: Diagnosis not present

## 2021-06-09 DIAGNOSIS — N2581 Secondary hyperparathyroidism of renal origin: Secondary | ICD-10-CM | POA: Diagnosis not present

## 2021-06-09 DIAGNOSIS — D631 Anemia in chronic kidney disease: Secondary | ICD-10-CM | POA: Diagnosis not present

## 2021-06-09 DIAGNOSIS — N186 End stage renal disease: Secondary | ICD-10-CM | POA: Diagnosis not present

## 2021-06-09 DIAGNOSIS — E876 Hypokalemia: Secondary | ICD-10-CM | POA: Diagnosis not present

## 2021-06-09 DIAGNOSIS — Z992 Dependence on renal dialysis: Secondary | ICD-10-CM | POA: Diagnosis not present

## 2021-06-11 DIAGNOSIS — N2581 Secondary hyperparathyroidism of renal origin: Secondary | ICD-10-CM | POA: Diagnosis not present

## 2021-06-11 DIAGNOSIS — D631 Anemia in chronic kidney disease: Secondary | ICD-10-CM | POA: Diagnosis not present

## 2021-06-11 DIAGNOSIS — N186 End stage renal disease: Secondary | ICD-10-CM | POA: Diagnosis not present

## 2021-06-11 DIAGNOSIS — Z992 Dependence on renal dialysis: Secondary | ICD-10-CM | POA: Diagnosis not present

## 2021-06-11 DIAGNOSIS — E876 Hypokalemia: Secondary | ICD-10-CM | POA: Diagnosis not present

## 2021-06-11 DIAGNOSIS — D509 Iron deficiency anemia, unspecified: Secondary | ICD-10-CM | POA: Diagnosis not present

## 2021-06-13 NOTE — Progress Notes (Signed)
Patient advised.

## 2021-06-14 DIAGNOSIS — N2581 Secondary hyperparathyroidism of renal origin: Secondary | ICD-10-CM | POA: Diagnosis not present

## 2021-06-14 DIAGNOSIS — D631 Anemia in chronic kidney disease: Secondary | ICD-10-CM | POA: Diagnosis not present

## 2021-06-14 DIAGNOSIS — D509 Iron deficiency anemia, unspecified: Secondary | ICD-10-CM | POA: Diagnosis not present

## 2021-06-14 DIAGNOSIS — Z992 Dependence on renal dialysis: Secondary | ICD-10-CM | POA: Diagnosis not present

## 2021-06-14 DIAGNOSIS — E876 Hypokalemia: Secondary | ICD-10-CM | POA: Diagnosis not present

## 2021-06-14 DIAGNOSIS — N186 End stage renal disease: Secondary | ICD-10-CM | POA: Diagnosis not present

## 2021-06-15 ENCOUNTER — Other Ambulatory Visit: Payer: Self-pay | Admitting: Family Medicine

## 2021-06-16 DIAGNOSIS — D509 Iron deficiency anemia, unspecified: Secondary | ICD-10-CM | POA: Diagnosis not present

## 2021-06-16 DIAGNOSIS — Z992 Dependence on renal dialysis: Secondary | ICD-10-CM | POA: Diagnosis not present

## 2021-06-16 DIAGNOSIS — N2581 Secondary hyperparathyroidism of renal origin: Secondary | ICD-10-CM | POA: Diagnosis not present

## 2021-06-16 DIAGNOSIS — N186 End stage renal disease: Secondary | ICD-10-CM | POA: Diagnosis not present

## 2021-06-16 DIAGNOSIS — E876 Hypokalemia: Secondary | ICD-10-CM | POA: Diagnosis not present

## 2021-06-16 DIAGNOSIS — D631 Anemia in chronic kidney disease: Secondary | ICD-10-CM | POA: Diagnosis not present

## 2021-06-18 DIAGNOSIS — E876 Hypokalemia: Secondary | ICD-10-CM | POA: Diagnosis not present

## 2021-06-18 DIAGNOSIS — N2581 Secondary hyperparathyroidism of renal origin: Secondary | ICD-10-CM | POA: Diagnosis not present

## 2021-06-18 DIAGNOSIS — Z992 Dependence on renal dialysis: Secondary | ICD-10-CM | POA: Diagnosis not present

## 2021-06-18 DIAGNOSIS — D509 Iron deficiency anemia, unspecified: Secondary | ICD-10-CM | POA: Diagnosis not present

## 2021-06-18 DIAGNOSIS — N186 End stage renal disease: Secondary | ICD-10-CM | POA: Diagnosis not present

## 2021-06-18 DIAGNOSIS — D631 Anemia in chronic kidney disease: Secondary | ICD-10-CM | POA: Diagnosis not present

## 2021-06-20 DIAGNOSIS — N186 End stage renal disease: Secondary | ICD-10-CM | POA: Diagnosis not present

## 2021-06-20 DIAGNOSIS — E1122 Type 2 diabetes mellitus with diabetic chronic kidney disease: Secondary | ICD-10-CM | POA: Diagnosis not present

## 2021-06-20 DIAGNOSIS — Z992 Dependence on renal dialysis: Secondary | ICD-10-CM | POA: Diagnosis not present

## 2021-06-21 DIAGNOSIS — N2581 Secondary hyperparathyroidism of renal origin: Secondary | ICD-10-CM | POA: Diagnosis not present

## 2021-06-21 DIAGNOSIS — Z992 Dependence on renal dialysis: Secondary | ICD-10-CM | POA: Diagnosis not present

## 2021-06-21 DIAGNOSIS — N186 End stage renal disease: Secondary | ICD-10-CM | POA: Diagnosis not present

## 2021-06-22 ENCOUNTER — Ambulatory Visit: Payer: Medicare Other | Admitting: Family Medicine

## 2021-06-23 DIAGNOSIS — Z992 Dependence on renal dialysis: Secondary | ICD-10-CM | POA: Diagnosis not present

## 2021-06-23 DIAGNOSIS — N2581 Secondary hyperparathyroidism of renal origin: Secondary | ICD-10-CM | POA: Diagnosis not present

## 2021-06-23 DIAGNOSIS — N186 End stage renal disease: Secondary | ICD-10-CM | POA: Diagnosis not present

## 2021-06-25 DIAGNOSIS — Z992 Dependence on renal dialysis: Secondary | ICD-10-CM | POA: Diagnosis not present

## 2021-06-25 DIAGNOSIS — N2581 Secondary hyperparathyroidism of renal origin: Secondary | ICD-10-CM | POA: Diagnosis not present

## 2021-06-25 DIAGNOSIS — N186 End stage renal disease: Secondary | ICD-10-CM | POA: Diagnosis not present

## 2021-06-27 ENCOUNTER — Ambulatory Visit (INDEPENDENT_AMBULATORY_CARE_PROVIDER_SITE_OTHER): Payer: Medicare Other | Admitting: Physician Assistant

## 2021-06-27 VITALS — BP 123/57 | HR 68

## 2021-06-27 DIAGNOSIS — I4891 Unspecified atrial fibrillation: Secondary | ICD-10-CM | POA: Diagnosis not present

## 2021-06-27 LAB — POCT INR: INR: 2.5 (ref 2.0–3.0)

## 2021-06-27 NOTE — Progress Notes (Signed)
Same dose. Recheck in 4 weeks.

## 2021-06-28 DIAGNOSIS — Z992 Dependence on renal dialysis: Secondary | ICD-10-CM | POA: Diagnosis not present

## 2021-06-28 DIAGNOSIS — N2581 Secondary hyperparathyroidism of renal origin: Secondary | ICD-10-CM | POA: Diagnosis not present

## 2021-06-28 DIAGNOSIS — N186 End stage renal disease: Secondary | ICD-10-CM | POA: Diagnosis not present

## 2021-06-28 NOTE — Progress Notes (Signed)
Patient advised.

## 2021-06-29 ENCOUNTER — Ambulatory Visit: Payer: Medicare Other

## 2021-06-30 DIAGNOSIS — N186 End stage renal disease: Secondary | ICD-10-CM | POA: Diagnosis not present

## 2021-06-30 DIAGNOSIS — N2581 Secondary hyperparathyroidism of renal origin: Secondary | ICD-10-CM | POA: Diagnosis not present

## 2021-06-30 DIAGNOSIS — Z992 Dependence on renal dialysis: Secondary | ICD-10-CM | POA: Diagnosis not present

## 2021-07-01 ENCOUNTER — Encounter: Payer: Self-pay | Admitting: Medical-Surgical

## 2021-07-01 ENCOUNTER — Telehealth (INDEPENDENT_AMBULATORY_CARE_PROVIDER_SITE_OTHER): Payer: Medicare Other | Admitting: Medical-Surgical

## 2021-07-01 VITALS — Temp 101.6°F

## 2021-07-01 DIAGNOSIS — U071 COVID-19: Secondary | ICD-10-CM | POA: Diagnosis not present

## 2021-07-01 DIAGNOSIS — R197 Diarrhea, unspecified: Secondary | ICD-10-CM | POA: Diagnosis not present

## 2021-07-01 DIAGNOSIS — R1084 Generalized abdominal pain: Secondary | ICD-10-CM

## 2021-07-01 NOTE — Progress Notes (Signed)
Fever Diarrhea since Tuesday Last weekend wife gave him old Cipro she had to try to "reduce flu symptoms"

## 2021-07-01 NOTE — Progress Notes (Signed)
Virtual Visit via Video Note  I connected with Louis [name] on 07/01/21 at  1:00 PM EDT by a video enabled telemedicine application and verified that I am speaking with the correct person using two identifiers.   I discussed the limitations of evaluation and management by telemedicine and the availability of in person appointments. The patient expressed understanding and agreed to proceed.  Patient location: home Provider locations: office  Subjective:    CC: diarrhea  HPI: Pleasant 75 year old male accompanied by his wife presenting with reports of profuse diarrhea.  Notes that they had flulike symptoms approximately 2 weeks ago and did not test for COVID but decided to treat themselves.  Unfortunately the patient's cough was lingering and he was not rebounding as quickly.  His wife had leftover ciprofloxacin in the cabinet that had been prescribed to him but never finished.  She decided to treat him with this for approximately 3 days.  She did that over the weekend and then Tuesday, his diarrhea developed.  He is having some generalized abdominal pain as well as increased flatulence.  No abdominal bloating.  His stools are light brown and liquid with no melena or hematochezia.  He has having difficulty controlling his stools and has had some trouble with fecal incontinence.  No mucus or unusual odor to the stools.  Notes that he had approximately 10 bowel movements yesterday.  This morning, he woke with a 1-1 point days, Thursdays, and Saturdays.  He is restricted to 30 fluid per day I am overload.  On his last treatment, he was at his dry weight with no extra fluid to remove.  His appetite has been very poor but they have been working to eat on the [employer].   Past medical history, Surgical history, Family history not pertinant except as noted below, Social history, Allergies, and medications have been entered into the medical record, reviewed, and corrections made.   Review of Systems: See  HPI for pertinent positives and negatives.   Objective:    General: Speaking clearly in complete sentences without any shortness of breath.  Alert and oriented x3.  Normal judgment. No apparent acute distress.  Impression and Recommendations:    1. Diarrhea, unspecified type 2. Generalized abdominal pain Unclear etiology.  With recent treatment with partial course of antibiotics, there is some concern for possible C. difficile versus antibiotic side effect.  Recommend working to stay hydrated as well as continuing small amounts of foods that are bland and soft.  Ordering stool culture, C. difficile evaluation, and stool for ova and parasites.  Avoiding antidiarrheals until we have the results of these tests.  Tylenol every 8 hours as needed for fever.  May benefit from contacting his hemodialysis center to make sure they are aware that he has having diarrhea so that they can properly isolate him while waiting on results. - Stool Culture - C. difficile GDH and Toxin A/B - Ova and parasite examination  I discussed the assessment and treatment plan with the patient. The patient was provided an opportunity to ask questions and all were answered. The patient agreed with the plan and demonstrated an understanding of the instructions.   The patient was advised to call back or seek an in-person evaluation if the symptoms worsen or if the condition fails to improve as anticipated.  20 minutes of non-face-to-face time was provided during this encounter.  Return if symptoms worsen or fail to improve.  Clearnce Sorrel, DNP, APRN, FNP-BC [place]  [place] [facility]

## 2021-07-02 DIAGNOSIS — K5790 Diverticulosis of intestine, part unspecified, without perforation or abscess without bleeding: Secondary | ICD-10-CM | POA: Diagnosis not present

## 2021-07-02 DIAGNOSIS — I517 Cardiomegaly: Secondary | ICD-10-CM | POA: Diagnosis not present

## 2021-07-02 DIAGNOSIS — E871 Hypo-osmolality and hyponatremia: Secondary | ICD-10-CM | POA: Diagnosis not present

## 2021-07-02 DIAGNOSIS — K529 Noninfective gastroenteritis and colitis, unspecified: Secondary | ICD-10-CM | POA: Diagnosis not present

## 2021-07-02 DIAGNOSIS — E876 Hypokalemia: Secondary | ICD-10-CM | POA: Diagnosis not present

## 2021-07-02 DIAGNOSIS — D631 Anemia in chronic kidney disease: Secondary | ICD-10-CM | POA: Diagnosis not present

## 2021-07-02 DIAGNOSIS — U071 COVID-19: Secondary | ICD-10-CM | POA: Diagnosis not present

## 2021-07-02 DIAGNOSIS — K5100 Ulcerative (chronic) pancolitis without complications: Secondary | ICD-10-CM | POA: Diagnosis not present

## 2021-07-02 DIAGNOSIS — Z992 Dependence on renal dialysis: Secondary | ICD-10-CM | POA: Diagnosis not present

## 2021-07-02 DIAGNOSIS — E278 Other specified disorders of adrenal gland: Secondary | ICD-10-CM | POA: Diagnosis not present

## 2021-07-02 DIAGNOSIS — A4189 Other specified sepsis: Secondary | ICD-10-CM | POA: Diagnosis not present

## 2021-07-02 DIAGNOSIS — R112 Nausea with vomiting, unspecified: Secondary | ICD-10-CM | POA: Diagnosis not present

## 2021-07-02 DIAGNOSIS — I2510 Atherosclerotic heart disease of native coronary artery without angina pectoris: Secondary | ICD-10-CM | POA: Diagnosis not present

## 2021-07-02 DIAGNOSIS — G4733 Obstructive sleep apnea (adult) (pediatric): Secondary | ICD-10-CM | POA: Diagnosis not present

## 2021-07-02 DIAGNOSIS — K2211 Ulcer of esophagus with bleeding: Secondary | ICD-10-CM | POA: Diagnosis not present

## 2021-07-02 DIAGNOSIS — R059 Cough, unspecified: Secondary | ICD-10-CM | POA: Diagnosis not present

## 2021-07-02 DIAGNOSIS — R140 Abdominal distension (gaseous): Secondary | ICD-10-CM | POA: Diagnosis not present

## 2021-07-02 DIAGNOSIS — N2581 Secondary hyperparathyroidism of renal origin: Secondary | ICD-10-CM | POA: Diagnosis not present

## 2021-07-02 DIAGNOSIS — I10 Essential (primary) hypertension: Secondary | ICD-10-CM | POA: Diagnosis not present

## 2021-07-02 DIAGNOSIS — E1142 Type 2 diabetes mellitus with diabetic polyneuropathy: Secondary | ICD-10-CM | POA: Diagnosis not present

## 2021-07-02 DIAGNOSIS — A0472 Enterocolitis due to Clostridium difficile, not specified as recurrent: Secondary | ICD-10-CM | POA: Diagnosis not present

## 2021-07-02 DIAGNOSIS — I482 Chronic atrial fibrillation, unspecified: Secondary | ICD-10-CM | POA: Diagnosis not present

## 2021-07-02 DIAGNOSIS — K6389 Other specified diseases of intestine: Secondary | ICD-10-CM | POA: Diagnosis not present

## 2021-07-02 DIAGNOSIS — N186 End stage renal disease: Secondary | ICD-10-CM | POA: Diagnosis not present

## 2021-07-03 DIAGNOSIS — I10 Essential (primary) hypertension: Secondary | ICD-10-CM | POA: Diagnosis not present

## 2021-07-03 DIAGNOSIS — A419 Sepsis, unspecified organism: Secondary | ICD-10-CM | POA: Diagnosis not present

## 2021-07-03 DIAGNOSIS — I482 Chronic atrial fibrillation, unspecified: Secondary | ICD-10-CM | POA: Diagnosis not present

## 2021-07-03 DIAGNOSIS — Z992 Dependence on renal dialysis: Secondary | ICD-10-CM | POA: Diagnosis not present

## 2021-07-03 DIAGNOSIS — I2510 Atherosclerotic heart disease of native coronary artery without angina pectoris: Secondary | ICD-10-CM | POA: Diagnosis not present

## 2021-07-03 DIAGNOSIS — N186 End stage renal disease: Secondary | ICD-10-CM | POA: Diagnosis not present

## 2021-07-03 DIAGNOSIS — D631 Anemia in chronic kidney disease: Secondary | ICD-10-CM | POA: Diagnosis not present

## 2021-07-03 DIAGNOSIS — E876 Hypokalemia: Secondary | ICD-10-CM | POA: Diagnosis not present

## 2021-07-03 DIAGNOSIS — A0472 Enterocolitis due to Clostridium difficile, not specified as recurrent: Secondary | ICD-10-CM | POA: Diagnosis not present

## 2021-07-03 DIAGNOSIS — E1142 Type 2 diabetes mellitus with diabetic polyneuropathy: Secondary | ICD-10-CM | POA: Diagnosis not present

## 2021-07-03 DIAGNOSIS — E871 Hypo-osmolality and hyponatremia: Secondary | ICD-10-CM | POA: Diagnosis not present

## 2021-07-03 DIAGNOSIS — U071 COVID-19: Secondary | ICD-10-CM | POA: Diagnosis not present

## 2021-07-03 MED ORDER — VANCOMYCIN HCL 125 MG PO CAPS: 125.0000 mg | ORAL_CAPSULE | Freq: Four times a day (QID) | ORAL | 0 refills | Status: AC

## 2021-07-03 NOTE — Addendum Note (Signed)
Addended bySamuel Bouche on: 07/03/2021 12:03 AM   Modules accepted: Orders

## 2021-07-04 DIAGNOSIS — E1142 Type 2 diabetes mellitus with diabetic polyneuropathy: Secondary | ICD-10-CM | POA: Diagnosis not present

## 2021-07-04 DIAGNOSIS — I482 Chronic atrial fibrillation, unspecified: Secondary | ICD-10-CM | POA: Diagnosis not present

## 2021-07-04 DIAGNOSIS — N186 End stage renal disease: Secondary | ICD-10-CM | POA: Diagnosis not present

## 2021-07-04 DIAGNOSIS — E871 Hypo-osmolality and hyponatremia: Secondary | ICD-10-CM | POA: Diagnosis not present

## 2021-07-04 DIAGNOSIS — D631 Anemia in chronic kidney disease: Secondary | ICD-10-CM | POA: Diagnosis not present

## 2021-07-04 DIAGNOSIS — E876 Hypokalemia: Secondary | ICD-10-CM | POA: Diagnosis not present

## 2021-07-04 DIAGNOSIS — U071 COVID-19: Secondary | ICD-10-CM | POA: Diagnosis not present

## 2021-07-04 DIAGNOSIS — I10 Essential (primary) hypertension: Secondary | ICD-10-CM | POA: Diagnosis not present

## 2021-07-04 DIAGNOSIS — A0472 Enterocolitis due to Clostridium difficile, not specified as recurrent: Secondary | ICD-10-CM | POA: Diagnosis not present

## 2021-07-04 DIAGNOSIS — A419 Sepsis, unspecified organism: Secondary | ICD-10-CM | POA: Diagnosis not present

## 2021-07-04 DIAGNOSIS — I2510 Atherosclerotic heart disease of native coronary artery without angina pectoris: Secondary | ICD-10-CM | POA: Diagnosis not present

## 2021-07-04 DIAGNOSIS — Z992 Dependence on renal dialysis: Secondary | ICD-10-CM | POA: Diagnosis not present

## 2021-07-05 DIAGNOSIS — E1140 Type 2 diabetes mellitus with diabetic neuropathy, unspecified: Secondary | ICD-10-CM | POA: Diagnosis not present

## 2021-07-05 DIAGNOSIS — D500 Iron deficiency anemia secondary to blood loss (chronic): Secondary | ICD-10-CM | POA: Diagnosis not present

## 2021-07-05 DIAGNOSIS — T368X5A Adverse effect of other systemic antibiotics, initial encounter: Secondary | ICD-10-CM | POA: Diagnosis not present

## 2021-07-05 DIAGNOSIS — I4891 Unspecified atrial fibrillation: Secondary | ICD-10-CM | POA: Diagnosis not present

## 2021-07-05 DIAGNOSIS — E11610 Type 2 diabetes mellitus with diabetic neuropathic arthropathy: Secondary | ICD-10-CM | POA: Diagnosis present

## 2021-07-05 DIAGNOSIS — K921 Melena: Secondary | ICD-10-CM | POA: Diagnosis not present

## 2021-07-05 DIAGNOSIS — K529 Noninfective gastroenteritis and colitis, unspecified: Secondary | ICD-10-CM | POA: Diagnosis present

## 2021-07-05 DIAGNOSIS — I482 Chronic atrial fibrillation, unspecified: Secondary | ICD-10-CM | POA: Diagnosis not present

## 2021-07-05 DIAGNOSIS — N186 End stage renal disease: Secondary | ICD-10-CM | POA: Diagnosis present

## 2021-07-05 DIAGNOSIS — R269 Unspecified abnormalities of gait and mobility: Secondary | ICD-10-CM | POA: Diagnosis present

## 2021-07-05 DIAGNOSIS — R197 Diarrhea, unspecified: Secondary | ICD-10-CM | POA: Diagnosis not present

## 2021-07-05 DIAGNOSIS — D6869 Other thrombophilia: Secondary | ICD-10-CM | POA: Diagnosis present

## 2021-07-05 DIAGNOSIS — M6281 Muscle weakness (generalized): Secondary | ICD-10-CM | POA: Diagnosis present

## 2021-07-05 DIAGNOSIS — R112 Nausea with vomiting, unspecified: Secondary | ICD-10-CM | POA: Diagnosis not present

## 2021-07-05 DIAGNOSIS — K5100 Ulcerative (chronic) pancolitis without complications: Secondary | ICD-10-CM | POA: Diagnosis present

## 2021-07-05 DIAGNOSIS — K567 Ileus, unspecified: Secondary | ICD-10-CM | POA: Diagnosis not present

## 2021-07-05 DIAGNOSIS — I132 Hypertensive heart and chronic kidney disease with heart failure and with stage 5 chronic kidney disease, or end stage renal disease: Secondary | ICD-10-CM | POA: Diagnosis present

## 2021-07-05 DIAGNOSIS — R279 Unspecified lack of coordination: Secondary | ICD-10-CM | POA: Diagnosis present

## 2021-07-05 DIAGNOSIS — K2211 Ulcer of esophagus with bleeding: Secondary | ICD-10-CM | POA: Diagnosis present

## 2021-07-05 DIAGNOSIS — Z794 Long term (current) use of insulin: Secondary | ICD-10-CM | POA: Diagnosis not present

## 2021-07-05 DIAGNOSIS — Z741 Need for assistance with personal care: Secondary | ICD-10-CM | POA: Diagnosis present

## 2021-07-05 DIAGNOSIS — E11319 Type 2 diabetes mellitus with unspecified diabetic retinopathy without macular edema: Secondary | ICD-10-CM | POA: Diagnosis present

## 2021-07-05 DIAGNOSIS — R0603 Acute respiratory distress: Secondary | ICD-10-CM | POA: Diagnosis not present

## 2021-07-05 DIAGNOSIS — U071 COVID-19: Secondary | ICD-10-CM | POA: Diagnosis present

## 2021-07-05 DIAGNOSIS — K920 Hematemesis: Secondary | ICD-10-CM | POA: Diagnosis not present

## 2021-07-05 DIAGNOSIS — I480 Paroxysmal atrial fibrillation: Secondary | ICD-10-CM | POA: Diagnosis present

## 2021-07-05 DIAGNOSIS — A419 Sepsis, unspecified organism: Secondary | ICD-10-CM | POA: Diagnosis not present

## 2021-07-05 DIAGNOSIS — A4189 Other specified sepsis: Secondary | ICD-10-CM | POA: Diagnosis present

## 2021-07-05 DIAGNOSIS — E871 Hypo-osmolality and hyponatremia: Secondary | ICD-10-CM | POA: Diagnosis not present

## 2021-07-05 DIAGNOSIS — E785 Hyperlipidemia, unspecified: Secondary | ICD-10-CM | POA: Diagnosis present

## 2021-07-05 DIAGNOSIS — D6832 Hemorrhagic disorder due to extrinsic circulating anticoagulants: Secondary | ICD-10-CM | POA: Diagnosis not present

## 2021-07-05 DIAGNOSIS — R061 Stridor: Secondary | ICD-10-CM | POA: Diagnosis not present

## 2021-07-05 DIAGNOSIS — N19 Unspecified kidney failure: Secondary | ICD-10-CM | POA: Diagnosis not present

## 2021-07-05 DIAGNOSIS — J984 Other disorders of lung: Secondary | ICD-10-CM | POA: Diagnosis not present

## 2021-07-05 DIAGNOSIS — R569 Unspecified convulsions: Secondary | ICD-10-CM | POA: Diagnosis not present

## 2021-07-05 DIAGNOSIS — Z20822 Contact with and (suspected) exposure to covid-19: Secondary | ICD-10-CM | POA: Diagnosis not present

## 2021-07-05 DIAGNOSIS — R140 Abdominal distension (gaseous): Secondary | ICD-10-CM | POA: Diagnosis not present

## 2021-07-05 DIAGNOSIS — D631 Anemia in chronic kidney disease: Secondary | ICD-10-CM | POA: Diagnosis not present

## 2021-07-05 DIAGNOSIS — E1122 Type 2 diabetes mellitus with diabetic chronic kidney disease: Secondary | ICD-10-CM | POA: Diagnosis present

## 2021-07-05 DIAGNOSIS — E876 Hypokalemia: Secondary | ICD-10-CM | POA: Diagnosis not present

## 2021-07-05 DIAGNOSIS — K2210 Ulcer of esophagus without bleeding: Secondary | ICD-10-CM | POA: Diagnosis not present

## 2021-07-05 DIAGNOSIS — R5381 Other malaise: Secondary | ICD-10-CM | POA: Diagnosis not present

## 2021-07-05 DIAGNOSIS — A0472 Enterocolitis due to Clostridium difficile, not specified as recurrent: Secondary | ICD-10-CM | POA: Diagnosis present

## 2021-07-05 DIAGNOSIS — K2101 Gastro-esophageal reflux disease with esophagitis, with bleeding: Secondary | ICD-10-CM | POA: Diagnosis not present

## 2021-07-05 DIAGNOSIS — R531 Weakness: Secondary | ICD-10-CM | POA: Diagnosis present

## 2021-07-05 DIAGNOSIS — M545 Low back pain, unspecified: Secondary | ICD-10-CM | POA: Diagnosis present

## 2021-07-05 DIAGNOSIS — R000 Tachycardia, unspecified: Secondary | ICD-10-CM | POA: Diagnosis not present

## 2021-07-05 DIAGNOSIS — I509 Heart failure, unspecified: Secondary | ICD-10-CM | POA: Diagnosis not present

## 2021-07-05 DIAGNOSIS — I10 Essential (primary) hypertension: Secondary | ICD-10-CM | POA: Diagnosis not present

## 2021-07-05 DIAGNOSIS — E1142 Type 2 diabetes mellitus with diabetic polyneuropathy: Secondary | ICD-10-CM | POA: Diagnosis not present

## 2021-07-05 DIAGNOSIS — K922 Gastrointestinal hemorrhage, unspecified: Secondary | ICD-10-CM | POA: Diagnosis not present

## 2021-07-05 DIAGNOSIS — G4733 Obstructive sleep apnea (adult) (pediatric): Secondary | ICD-10-CM | POA: Diagnosis not present

## 2021-07-05 DIAGNOSIS — D62 Acute posthemorrhagic anemia: Secondary | ICD-10-CM | POA: Diagnosis not present

## 2021-07-05 DIAGNOSIS — K5790 Diverticulosis of intestine, part unspecified, without perforation or abscess without bleeding: Secondary | ICD-10-CM | POA: Diagnosis present

## 2021-07-05 DIAGNOSIS — G4730 Sleep apnea, unspecified: Secondary | ICD-10-CM | POA: Diagnosis not present

## 2021-07-05 DIAGNOSIS — R609 Edema, unspecified: Secondary | ICD-10-CM | POA: Diagnosis not present

## 2021-07-05 DIAGNOSIS — R2681 Unsteadiness on feet: Secondary | ICD-10-CM | POA: Diagnosis present

## 2021-07-05 DIAGNOSIS — Z8616 Personal history of COVID-19: Secondary | ICD-10-CM | POA: Diagnosis present

## 2021-07-05 DIAGNOSIS — E1129 Type 2 diabetes mellitus with other diabetic kidney complication: Secondary | ICD-10-CM | POA: Diagnosis present

## 2021-07-05 DIAGNOSIS — K3189 Other diseases of stomach and duodenum: Secondary | ICD-10-CM | POA: Diagnosis not present

## 2021-07-05 DIAGNOSIS — I2510 Atherosclerotic heart disease of native coronary artery without angina pectoris: Secondary | ICD-10-CM | POA: Diagnosis present

## 2021-07-05 DIAGNOSIS — Z992 Dependence on renal dialysis: Secondary | ICD-10-CM | POA: Diagnosis not present

## 2021-07-06 ENCOUNTER — Ambulatory Visit: Payer: Medicare Other | Admitting: Family Medicine

## 2021-07-07 DIAGNOSIS — A0472 Enterocolitis due to Clostridium difficile, not specified as recurrent: Secondary | ICD-10-CM | POA: Diagnosis not present

## 2021-07-07 LAB — OVA AND PARASITE EXAMINATION
CONCENTRATE RESULT:: NONE SEEN
MICRO NUMBER:: 12240690
SPECIMEN QUALITY:: ADEQUATE
TRICHROME RESULT:: NONE SEEN

## 2021-07-07 LAB — C. DIFFICILE GDH AND TOXIN A/B
GDH ANTIGEN: DETECTED
MICRO NUMBER:: 12238346
SPECIMEN QUALITY:: ADEQUATE
TOXIN A AND B: DETECTED

## 2021-07-08 ENCOUNTER — Telehealth: Payer: Medicare Other

## 2021-07-08 DIAGNOSIS — A0472 Enterocolitis due to Clostridium difficile, not specified as recurrent: Secondary | ICD-10-CM | POA: Diagnosis not present

## 2021-07-15 ENCOUNTER — Telehealth: Payer: Medicare Other

## 2021-07-18 ENCOUNTER — Ambulatory Visit: Payer: Medicare Other

## 2021-07-25 DIAGNOSIS — Z20822 Contact with and (suspected) exposure to covid-19: Secondary | ICD-10-CM | POA: Diagnosis not present

## 2021-07-27 ENCOUNTER — Ambulatory Visit: Payer: Medicare Other

## 2021-08-01 NOTE — Telephone Encounter (Signed)
Patient wife concerned about using warfarin with current anemia/C diff.  Because he's on dialysis, Eliquis would be our only other option, but there is not a lot of data for this.  Some nephrologists are okay, but others still hesitant.  Not sure that it would have significantly decreased any bleeding issues, although INR does increase with diarrhea.  Lastly, his insurance looks as thought there is a high front-end deductible for brand drugs on his insurance ($480 currently - not sure if he had to pay it for Novolog insulin or not).   Might want to check with Nephrology before we decide on any changes.

## 2021-08-02 NOTE — Telephone Encounter (Signed)
Spoke with wife.  She states they were told essentially the same information by the hospital, but wanted to have Dr. Jacalyn Lefevre opinion.  Eduard Clos doing better, will be discharged from hospital to rehab tomorrow and is resuming warfarin tonight.  She appreciated our information.

## 2021-08-12 DIAGNOSIS — D631 Anemia in chronic kidney disease: Secondary | ICD-10-CM | POA: Diagnosis not present

## 2021-08-12 DIAGNOSIS — E871 Hypo-osmolality and hyponatremia: Secondary | ICD-10-CM | POA: Diagnosis not present

## 2021-08-12 DIAGNOSIS — M6281 Muscle weakness (generalized): Secondary | ICD-10-CM | POA: Diagnosis not present

## 2021-08-12 DIAGNOSIS — E1142 Type 2 diabetes mellitus with diabetic polyneuropathy: Secondary | ICD-10-CM | POA: Diagnosis not present

## 2021-08-12 DIAGNOSIS — R269 Unspecified abnormalities of gait and mobility: Secondary | ICD-10-CM | POA: Diagnosis not present

## 2021-08-12 DIAGNOSIS — Z741 Need for assistance with personal care: Secondary | ICD-10-CM | POA: Diagnosis not present

## 2021-08-12 DIAGNOSIS — Z7901 Long term (current) use of anticoagulants: Secondary | ICD-10-CM | POA: Diagnosis not present

## 2021-08-12 DIAGNOSIS — E119 Type 2 diabetes mellitus without complications: Secondary | ICD-10-CM | POA: Diagnosis not present

## 2021-08-12 DIAGNOSIS — E785 Hyperlipidemia, unspecified: Secondary | ICD-10-CM | POA: Diagnosis not present

## 2021-08-12 DIAGNOSIS — K5900 Constipation, unspecified: Secondary | ICD-10-CM | POA: Diagnosis not present

## 2021-08-12 DIAGNOSIS — N19 Unspecified kidney failure: Secondary | ICD-10-CM | POA: Diagnosis not present

## 2021-08-12 DIAGNOSIS — G4733 Obstructive sleep apnea (adult) (pediatric): Secondary | ICD-10-CM | POA: Diagnosis not present

## 2021-08-12 DIAGNOSIS — L89623 Pressure ulcer of left heel, stage 3: Secondary | ICD-10-CM | POA: Diagnosis not present

## 2021-08-12 DIAGNOSIS — A0472 Enterocolitis due to Clostridium difficile, not specified as recurrent: Secondary | ICD-10-CM | POA: Diagnosis not present

## 2021-08-12 DIAGNOSIS — D62 Acute posthemorrhagic anemia: Secondary | ICD-10-CM | POA: Diagnosis not present

## 2021-08-12 DIAGNOSIS — Z992 Dependence on renal dialysis: Secondary | ICD-10-CM | POA: Diagnosis not present

## 2021-08-12 DIAGNOSIS — D6869 Other thrombophilia: Secondary | ICD-10-CM | POA: Diagnosis present

## 2021-08-12 DIAGNOSIS — I10 Essential (primary) hypertension: Secondary | ICD-10-CM | POA: Diagnosis present

## 2021-08-12 DIAGNOSIS — N186 End stage renal disease: Secondary | ICD-10-CM | POA: Diagnosis not present

## 2021-08-12 DIAGNOSIS — E1129 Type 2 diabetes mellitus with other diabetic kidney complication: Secondary | ICD-10-CM | POA: Diagnosis not present

## 2021-08-12 DIAGNOSIS — R279 Unspecified lack of coordination: Secondary | ICD-10-CM | POA: Diagnosis not present

## 2021-08-12 DIAGNOSIS — I2510 Atherosclerotic heart disease of native coronary artery without angina pectoris: Secondary | ICD-10-CM | POA: Diagnosis not present

## 2021-08-12 DIAGNOSIS — K529 Noninfective gastroenteritis and colitis, unspecified: Secondary | ICD-10-CM | POA: Diagnosis not present

## 2021-08-12 DIAGNOSIS — E1122 Type 2 diabetes mellitus with diabetic chronic kidney disease: Secondary | ICD-10-CM | POA: Diagnosis not present

## 2021-08-12 DIAGNOSIS — M545 Low back pain, unspecified: Secondary | ICD-10-CM | POA: Diagnosis not present

## 2021-08-12 DIAGNOSIS — Z8616 Personal history of COVID-19: Secondary | ICD-10-CM | POA: Diagnosis present

## 2021-08-12 DIAGNOSIS — I482 Chronic atrial fibrillation, unspecified: Secondary | ICD-10-CM | POA: Diagnosis not present

## 2021-08-12 DIAGNOSIS — R5381 Other malaise: Secondary | ICD-10-CM | POA: Diagnosis not present

## 2021-08-12 DIAGNOSIS — D649 Anemia, unspecified: Secondary | ICD-10-CM | POA: Diagnosis not present

## 2021-08-12 DIAGNOSIS — U071 COVID-19: Secondary | ICD-10-CM | POA: Diagnosis not present

## 2021-08-12 DIAGNOSIS — D509 Iron deficiency anemia, unspecified: Secondary | ICD-10-CM | POA: Diagnosis not present

## 2021-08-12 DIAGNOSIS — N2581 Secondary hyperparathyroidism of renal origin: Secondary | ICD-10-CM | POA: Diagnosis not present

## 2021-08-12 DIAGNOSIS — E876 Hypokalemia: Secondary | ICD-10-CM | POA: Diagnosis not present

## 2021-08-12 DIAGNOSIS — R2681 Unsteadiness on feet: Secondary | ICD-10-CM | POA: Diagnosis not present

## 2021-08-15 DIAGNOSIS — N2581 Secondary hyperparathyroidism of renal origin: Secondary | ICD-10-CM | POA: Diagnosis not present

## 2021-08-15 DIAGNOSIS — D509 Iron deficiency anemia, unspecified: Secondary | ICD-10-CM | POA: Diagnosis not present

## 2021-08-15 DIAGNOSIS — E871 Hypo-osmolality and hyponatremia: Secondary | ICD-10-CM | POA: Diagnosis not present

## 2021-08-15 DIAGNOSIS — N186 End stage renal disease: Secondary | ICD-10-CM | POA: Diagnosis not present

## 2021-08-15 DIAGNOSIS — A0472 Enterocolitis due to Clostridium difficile, not specified as recurrent: Secondary | ICD-10-CM | POA: Diagnosis not present

## 2021-08-15 DIAGNOSIS — D631 Anemia in chronic kidney disease: Secondary | ICD-10-CM | POA: Diagnosis not present

## 2021-08-15 DIAGNOSIS — G4733 Obstructive sleep apnea (adult) (pediatric): Secondary | ICD-10-CM | POA: Diagnosis not present

## 2021-08-17 DIAGNOSIS — K5900 Constipation, unspecified: Secondary | ICD-10-CM | POA: Diagnosis not present

## 2021-08-17 DIAGNOSIS — D649 Anemia, unspecified: Secondary | ICD-10-CM | POA: Diagnosis not present

## 2021-08-17 DIAGNOSIS — N2581 Secondary hyperparathyroidism of renal origin: Secondary | ICD-10-CM | POA: Diagnosis not present

## 2021-08-17 DIAGNOSIS — N186 End stage renal disease: Secondary | ICD-10-CM | POA: Diagnosis not present

## 2021-08-17 DIAGNOSIS — D509 Iron deficiency anemia, unspecified: Secondary | ICD-10-CM | POA: Diagnosis not present

## 2021-08-18 DIAGNOSIS — E119 Type 2 diabetes mellitus without complications: Secondary | ICD-10-CM | POA: Diagnosis not present

## 2021-08-18 DIAGNOSIS — N186 End stage renal disease: Secondary | ICD-10-CM | POA: Diagnosis not present

## 2021-08-18 DIAGNOSIS — E785 Hyperlipidemia, unspecified: Secondary | ICD-10-CM | POA: Diagnosis not present

## 2021-08-19 DIAGNOSIS — N2581 Secondary hyperparathyroidism of renal origin: Secondary | ICD-10-CM | POA: Diagnosis not present

## 2021-08-19 DIAGNOSIS — N186 End stage renal disease: Secondary | ICD-10-CM | POA: Diagnosis not present

## 2021-08-19 DIAGNOSIS — D649 Anemia, unspecified: Secondary | ICD-10-CM | POA: Diagnosis not present

## 2021-08-19 DIAGNOSIS — E119 Type 2 diabetes mellitus without complications: Secondary | ICD-10-CM | POA: Diagnosis not present

## 2021-08-19 DIAGNOSIS — D509 Iron deficiency anemia, unspecified: Secondary | ICD-10-CM | POA: Diagnosis not present

## 2021-08-19 DIAGNOSIS — E785 Hyperlipidemia, unspecified: Secondary | ICD-10-CM | POA: Diagnosis not present

## 2021-08-21 DIAGNOSIS — Z7901 Long term (current) use of anticoagulants: Secondary | ICD-10-CM | POA: Diagnosis not present

## 2021-08-21 DIAGNOSIS — U071 COVID-19: Secondary | ICD-10-CM | POA: Diagnosis not present

## 2021-08-21 DIAGNOSIS — N186 End stage renal disease: Secondary | ICD-10-CM | POA: Diagnosis not present

## 2021-08-22 DIAGNOSIS — D509 Iron deficiency anemia, unspecified: Secondary | ICD-10-CM | POA: Diagnosis not present

## 2021-08-22 DIAGNOSIS — N2581 Secondary hyperparathyroidism of renal origin: Secondary | ICD-10-CM | POA: Diagnosis not present

## 2021-08-22 DIAGNOSIS — N186 End stage renal disease: Secondary | ICD-10-CM | POA: Diagnosis not present

## 2021-08-24 DIAGNOSIS — D509 Iron deficiency anemia, unspecified: Secondary | ICD-10-CM | POA: Diagnosis not present

## 2021-08-24 DIAGNOSIS — N2581 Secondary hyperparathyroidism of renal origin: Secondary | ICD-10-CM | POA: Diagnosis not present

## 2021-08-24 DIAGNOSIS — N186 End stage renal disease: Secondary | ICD-10-CM | POA: Diagnosis not present

## 2021-08-26 DIAGNOSIS — D509 Iron deficiency anemia, unspecified: Secondary | ICD-10-CM | POA: Diagnosis not present

## 2021-08-26 DIAGNOSIS — N2581 Secondary hyperparathyroidism of renal origin: Secondary | ICD-10-CM | POA: Diagnosis not present

## 2021-08-26 DIAGNOSIS — N186 End stage renal disease: Secondary | ICD-10-CM | POA: Diagnosis not present

## 2021-08-29 ENCOUNTER — Telehealth: Payer: Self-pay | Admitting: Family Medicine

## 2021-08-29 DIAGNOSIS — N186 End stage renal disease: Secondary | ICD-10-CM | POA: Diagnosis not present

## 2021-08-29 DIAGNOSIS — D509 Iron deficiency anemia, unspecified: Secondary | ICD-10-CM | POA: Diagnosis not present

## 2021-08-29 DIAGNOSIS — N2581 Secondary hyperparathyroidism of renal origin: Secondary | ICD-10-CM | POA: Diagnosis not present

## 2021-08-29 NOTE — Telephone Encounter (Signed)
Patient would like to cancel appointment and did not want to reschedule at this time. Husband and her are going through it. He was in the hospital and had Cdif.

## 2021-08-31 DIAGNOSIS — N186 End stage renal disease: Secondary | ICD-10-CM | POA: Diagnosis not present

## 2021-08-31 DIAGNOSIS — D509 Iron deficiency anemia, unspecified: Secondary | ICD-10-CM | POA: Diagnosis not present

## 2021-08-31 DIAGNOSIS — D631 Anemia in chronic kidney disease: Secondary | ICD-10-CM | POA: Diagnosis not present

## 2021-08-31 DIAGNOSIS — E119 Type 2 diabetes mellitus without complications: Secondary | ICD-10-CM | POA: Diagnosis not present

## 2021-08-31 DIAGNOSIS — N2581 Secondary hyperparathyroidism of renal origin: Secondary | ICD-10-CM | POA: Diagnosis not present

## 2021-09-01 ENCOUNTER — Telehealth: Payer: Medicare Other

## 2021-09-02 DIAGNOSIS — N186 End stage renal disease: Secondary | ICD-10-CM | POA: Diagnosis not present

## 2021-09-02 DIAGNOSIS — D509 Iron deficiency anemia, unspecified: Secondary | ICD-10-CM | POA: Diagnosis not present

## 2021-09-02 DIAGNOSIS — N2581 Secondary hyperparathyroidism of renal origin: Secondary | ICD-10-CM | POA: Diagnosis not present

## 2021-09-05 DIAGNOSIS — D509 Iron deficiency anemia, unspecified: Secondary | ICD-10-CM | POA: Diagnosis not present

## 2021-09-05 DIAGNOSIS — N2581 Secondary hyperparathyroidism of renal origin: Secondary | ICD-10-CM | POA: Diagnosis not present

## 2021-09-05 DIAGNOSIS — N186 End stage renal disease: Secondary | ICD-10-CM | POA: Diagnosis not present

## 2021-09-07 DIAGNOSIS — N186 End stage renal disease: Secondary | ICD-10-CM | POA: Diagnosis not present

## 2021-09-07 DIAGNOSIS — N2581 Secondary hyperparathyroidism of renal origin: Secondary | ICD-10-CM | POA: Diagnosis not present

## 2021-09-07 DIAGNOSIS — D509 Iron deficiency anemia, unspecified: Secondary | ICD-10-CM | POA: Diagnosis not present

## 2021-09-09 DIAGNOSIS — N186 End stage renal disease: Secondary | ICD-10-CM | POA: Diagnosis not present

## 2021-09-09 DIAGNOSIS — N2581 Secondary hyperparathyroidism of renal origin: Secondary | ICD-10-CM | POA: Diagnosis not present

## 2021-09-09 DIAGNOSIS — D509 Iron deficiency anemia, unspecified: Secondary | ICD-10-CM | POA: Diagnosis not present

## 2021-09-12 DIAGNOSIS — D509 Iron deficiency anemia, unspecified: Secondary | ICD-10-CM | POA: Diagnosis not present

## 2021-09-12 DIAGNOSIS — N2581 Secondary hyperparathyroidism of renal origin: Secondary | ICD-10-CM | POA: Diagnosis not present

## 2021-09-12 DIAGNOSIS — N186 End stage renal disease: Secondary | ICD-10-CM | POA: Diagnosis not present

## 2021-09-14 DIAGNOSIS — N186 End stage renal disease: Secondary | ICD-10-CM | POA: Diagnosis not present

## 2021-09-14 DIAGNOSIS — D509 Iron deficiency anemia, unspecified: Secondary | ICD-10-CM | POA: Diagnosis not present

## 2021-09-14 DIAGNOSIS — N2581 Secondary hyperparathyroidism of renal origin: Secondary | ICD-10-CM | POA: Diagnosis not present

## 2021-09-16 DIAGNOSIS — D509 Iron deficiency anemia, unspecified: Secondary | ICD-10-CM | POA: Diagnosis not present

## 2021-09-16 DIAGNOSIS — N186 End stage renal disease: Secondary | ICD-10-CM | POA: Diagnosis not present

## 2021-09-16 DIAGNOSIS — N2581 Secondary hyperparathyroidism of renal origin: Secondary | ICD-10-CM | POA: Diagnosis not present

## 2021-09-19 DIAGNOSIS — N2581 Secondary hyperparathyroidism of renal origin: Secondary | ICD-10-CM | POA: Diagnosis not present

## 2021-09-19 DIAGNOSIS — N186 End stage renal disease: Secondary | ICD-10-CM | POA: Diagnosis not present

## 2021-09-19 DIAGNOSIS — D509 Iron deficiency anemia, unspecified: Secondary | ICD-10-CM | POA: Diagnosis not present

## 2021-09-19 DIAGNOSIS — Z992 Dependence on renal dialysis: Secondary | ICD-10-CM | POA: Diagnosis not present

## 2021-09-20 DIAGNOSIS — D631 Anemia in chronic kidney disease: Secondary | ICD-10-CM | POA: Diagnosis present

## 2021-09-20 DIAGNOSIS — E1129 Type 2 diabetes mellitus with other diabetic kidney complication: Secondary | ICD-10-CM | POA: Diagnosis present

## 2021-09-20 DIAGNOSIS — N2581 Secondary hyperparathyroidism of renal origin: Secondary | ICD-10-CM | POA: Diagnosis not present

## 2021-09-20 DIAGNOSIS — R269 Unspecified abnormalities of gait and mobility: Secondary | ICD-10-CM | POA: Diagnosis present

## 2021-09-20 DIAGNOSIS — E1122 Type 2 diabetes mellitus with diabetic chronic kidney disease: Secondary | ICD-10-CM | POA: Diagnosis not present

## 2021-09-20 DIAGNOSIS — L89623 Pressure ulcer of left heel, stage 3: Secondary | ICD-10-CM | POA: Diagnosis not present

## 2021-09-20 DIAGNOSIS — R2681 Unsteadiness on feet: Secondary | ICD-10-CM | POA: Diagnosis present

## 2021-09-20 DIAGNOSIS — M6281 Muscle weakness (generalized): Secondary | ICD-10-CM | POA: Diagnosis present

## 2021-09-20 DIAGNOSIS — E119 Type 2 diabetes mellitus without complications: Secondary | ICD-10-CM | POA: Diagnosis not present

## 2021-09-20 DIAGNOSIS — I2510 Atherosclerotic heart disease of native coronary artery without angina pectoris: Secondary | ICD-10-CM | POA: Diagnosis not present

## 2021-09-20 DIAGNOSIS — K529 Noninfective gastroenteritis and colitis, unspecified: Secondary | ICD-10-CM | POA: Diagnosis present

## 2021-09-20 DIAGNOSIS — Z741 Need for assistance with personal care: Secondary | ICD-10-CM | POA: Diagnosis present

## 2021-09-20 DIAGNOSIS — G4733 Obstructive sleep apnea (adult) (pediatric): Secondary | ICD-10-CM | POA: Diagnosis not present

## 2021-09-20 DIAGNOSIS — M545 Low back pain, unspecified: Secondary | ICD-10-CM | POA: Diagnosis present

## 2021-09-20 DIAGNOSIS — N186 End stage renal disease: Secondary | ICD-10-CM | POA: Diagnosis present

## 2021-09-20 DIAGNOSIS — E876 Hypokalemia: Secondary | ICD-10-CM | POA: Diagnosis present

## 2021-09-20 DIAGNOSIS — D62 Acute posthemorrhagic anemia: Secondary | ICD-10-CM | POA: Diagnosis not present

## 2021-09-20 DIAGNOSIS — I482 Chronic atrial fibrillation, unspecified: Secondary | ICD-10-CM | POA: Diagnosis present

## 2021-09-20 DIAGNOSIS — E1142 Type 2 diabetes mellitus with diabetic polyneuropathy: Secondary | ICD-10-CM | POA: Diagnosis present

## 2021-09-20 DIAGNOSIS — D509 Iron deficiency anemia, unspecified: Secondary | ICD-10-CM | POA: Diagnosis not present

## 2021-09-20 DIAGNOSIS — R279 Unspecified lack of coordination: Secondary | ICD-10-CM | POA: Diagnosis present

## 2021-09-20 DIAGNOSIS — Z992 Dependence on renal dialysis: Secondary | ICD-10-CM | POA: Diagnosis not present

## 2021-09-21 DIAGNOSIS — D509 Iron deficiency anemia, unspecified: Secondary | ICD-10-CM | POA: Diagnosis not present

## 2021-09-21 DIAGNOSIS — N2581 Secondary hyperparathyroidism of renal origin: Secondary | ICD-10-CM | POA: Diagnosis not present

## 2021-09-21 DIAGNOSIS — N186 End stage renal disease: Secondary | ICD-10-CM | POA: Diagnosis not present

## 2021-09-21 DIAGNOSIS — D631 Anemia in chronic kidney disease: Secondary | ICD-10-CM | POA: Diagnosis not present

## 2021-09-23 DIAGNOSIS — D631 Anemia in chronic kidney disease: Secondary | ICD-10-CM | POA: Diagnosis not present

## 2021-09-23 DIAGNOSIS — N2581 Secondary hyperparathyroidism of renal origin: Secondary | ICD-10-CM | POA: Diagnosis not present

## 2021-09-23 DIAGNOSIS — N186 End stage renal disease: Secondary | ICD-10-CM | POA: Diagnosis not present

## 2021-09-23 DIAGNOSIS — D509 Iron deficiency anemia, unspecified: Secondary | ICD-10-CM | POA: Diagnosis not present

## 2021-09-26 DIAGNOSIS — N2581 Secondary hyperparathyroidism of renal origin: Secondary | ICD-10-CM | POA: Diagnosis not present

## 2021-09-26 DIAGNOSIS — D631 Anemia in chronic kidney disease: Secondary | ICD-10-CM | POA: Diagnosis not present

## 2021-09-26 DIAGNOSIS — D509 Iron deficiency anemia, unspecified: Secondary | ICD-10-CM | POA: Diagnosis not present

## 2021-09-26 DIAGNOSIS — N186 End stage renal disease: Secondary | ICD-10-CM | POA: Diagnosis not present

## 2021-09-27 DIAGNOSIS — L89623 Pressure ulcer of left heel, stage 3: Secondary | ICD-10-CM | POA: Diagnosis not present

## 2021-09-27 DIAGNOSIS — I2510 Atherosclerotic heart disease of native coronary artery without angina pectoris: Secondary | ICD-10-CM | POA: Diagnosis not present

## 2021-09-27 DIAGNOSIS — N186 End stage renal disease: Secondary | ICD-10-CM | POA: Diagnosis not present

## 2021-09-27 DIAGNOSIS — E1142 Type 2 diabetes mellitus with diabetic polyneuropathy: Secondary | ICD-10-CM | POA: Diagnosis not present

## 2021-09-28 DIAGNOSIS — N2581 Secondary hyperparathyroidism of renal origin: Secondary | ICD-10-CM | POA: Diagnosis not present

## 2021-09-28 DIAGNOSIS — N186 End stage renal disease: Secondary | ICD-10-CM | POA: Diagnosis not present

## 2021-09-28 DIAGNOSIS — D509 Iron deficiency anemia, unspecified: Secondary | ICD-10-CM | POA: Diagnosis not present

## 2021-09-28 DIAGNOSIS — E119 Type 2 diabetes mellitus without complications: Secondary | ICD-10-CM | POA: Diagnosis not present

## 2021-09-28 DIAGNOSIS — G4733 Obstructive sleep apnea (adult) (pediatric): Secondary | ICD-10-CM | POA: Diagnosis not present

## 2021-09-28 DIAGNOSIS — D631 Anemia in chronic kidney disease: Secondary | ICD-10-CM | POA: Diagnosis not present

## 2021-09-29 DIAGNOSIS — G4733 Obstructive sleep apnea (adult) (pediatric): Secondary | ICD-10-CM | POA: Diagnosis not present

## 2021-09-29 DIAGNOSIS — E119 Type 2 diabetes mellitus without complications: Secondary | ICD-10-CM | POA: Diagnosis not present

## 2021-09-29 DIAGNOSIS — D62 Acute posthemorrhagic anemia: Secondary | ICD-10-CM | POA: Diagnosis not present

## 2021-09-29 DIAGNOSIS — N186 End stage renal disease: Secondary | ICD-10-CM | POA: Diagnosis not present

## 2021-09-30 DIAGNOSIS — A0471 Enterocolitis due to Clostridium difficile, recurrent: Secondary | ICD-10-CM | POA: Diagnosis not present

## 2021-09-30 DIAGNOSIS — K529 Noninfective gastroenteritis and colitis, unspecified: Secondary | ICD-10-CM | POA: Diagnosis not present

## 2021-09-30 DIAGNOSIS — Z4800 Encounter for change or removal of nonsurgical wound dressing: Secondary | ICD-10-CM | POA: Diagnosis not present

## 2021-09-30 DIAGNOSIS — Z992 Dependence on renal dialysis: Secondary | ICD-10-CM | POA: Diagnosis not present

## 2021-09-30 DIAGNOSIS — G43019 Migraine without aura, intractable, without status migrainosus: Secondary | ICD-10-CM | POA: Diagnosis not present

## 2021-09-30 DIAGNOSIS — M6281 Muscle weakness (generalized): Secondary | ICD-10-CM | POA: Diagnosis not present

## 2021-09-30 DIAGNOSIS — M545 Low back pain, unspecified: Secondary | ICD-10-CM | POA: Diagnosis not present

## 2021-09-30 DIAGNOSIS — R2681 Unsteadiness on feet: Secondary | ICD-10-CM | POA: Diagnosis not present

## 2021-09-30 DIAGNOSIS — E1129 Type 2 diabetes mellitus with other diabetic kidney complication: Secondary | ICD-10-CM | POA: Diagnosis not present

## 2021-09-30 DIAGNOSIS — D631 Anemia in chronic kidney disease: Secondary | ICD-10-CM | POA: Diagnosis not present

## 2021-09-30 DIAGNOSIS — L89623 Pressure ulcer of left heel, stage 3: Secondary | ICD-10-CM | POA: Diagnosis not present

## 2021-09-30 DIAGNOSIS — I482 Chronic atrial fibrillation, unspecified: Secondary | ICD-10-CM | POA: Diagnosis not present

## 2021-09-30 DIAGNOSIS — N186 End stage renal disease: Secondary | ICD-10-CM | POA: Diagnosis not present

## 2021-09-30 DIAGNOSIS — I110 Hypertensive heart disease with heart failure: Secondary | ICD-10-CM | POA: Diagnosis not present

## 2021-09-30 DIAGNOSIS — E1142 Type 2 diabetes mellitus with diabetic polyneuropathy: Secondary | ICD-10-CM | POA: Diagnosis not present

## 2021-09-30 DIAGNOSIS — I2510 Atherosclerotic heart disease of native coronary artery without angina pectoris: Secondary | ICD-10-CM | POA: Diagnosis not present

## 2021-09-30 DIAGNOSIS — H52209 Unspecified astigmatism, unspecified eye: Secondary | ICD-10-CM | POA: Diagnosis not present

## 2021-09-30 DIAGNOSIS — I509 Heart failure, unspecified: Secondary | ICD-10-CM | POA: Diagnosis not present

## 2021-10-01 DIAGNOSIS — Z992 Dependence on renal dialysis: Secondary | ICD-10-CM | POA: Diagnosis not present

## 2021-10-01 DIAGNOSIS — N186 End stage renal disease: Secondary | ICD-10-CM | POA: Diagnosis not present

## 2021-10-01 DIAGNOSIS — N2581 Secondary hyperparathyroidism of renal origin: Secondary | ICD-10-CM | POA: Diagnosis not present

## 2021-10-04 DIAGNOSIS — N2581 Secondary hyperparathyroidism of renal origin: Secondary | ICD-10-CM | POA: Diagnosis not present

## 2021-10-04 DIAGNOSIS — E1122 Type 2 diabetes mellitus with diabetic chronic kidney disease: Secondary | ICD-10-CM | POA: Diagnosis not present

## 2021-10-04 DIAGNOSIS — Z992 Dependence on renal dialysis: Secondary | ICD-10-CM | POA: Diagnosis not present

## 2021-10-04 DIAGNOSIS — N186 End stage renal disease: Secondary | ICD-10-CM | POA: Diagnosis not present

## 2021-10-05 DIAGNOSIS — L89623 Pressure ulcer of left heel, stage 3: Secondary | ICD-10-CM | POA: Diagnosis not present

## 2021-10-05 DIAGNOSIS — I482 Chronic atrial fibrillation, unspecified: Secondary | ICD-10-CM | POA: Diagnosis not present

## 2021-10-05 DIAGNOSIS — I2510 Atherosclerotic heart disease of native coronary artery without angina pectoris: Secondary | ICD-10-CM | POA: Diagnosis not present

## 2021-10-05 DIAGNOSIS — A0471 Enterocolitis due to Clostridium difficile, recurrent: Secondary | ICD-10-CM | POA: Diagnosis not present

## 2021-10-05 DIAGNOSIS — Z4800 Encounter for change or removal of nonsurgical wound dressing: Secondary | ICD-10-CM | POA: Diagnosis not present

## 2021-10-05 DIAGNOSIS — I110 Hypertensive heart disease with heart failure: Secondary | ICD-10-CM | POA: Diagnosis not present

## 2021-10-06 DIAGNOSIS — N2581 Secondary hyperparathyroidism of renal origin: Secondary | ICD-10-CM | POA: Diagnosis not present

## 2021-10-06 DIAGNOSIS — N186 End stage renal disease: Secondary | ICD-10-CM | POA: Diagnosis not present

## 2021-10-06 DIAGNOSIS — Z992 Dependence on renal dialysis: Secondary | ICD-10-CM | POA: Diagnosis not present

## 2021-10-07 DIAGNOSIS — A0471 Enterocolitis due to Clostridium difficile, recurrent: Secondary | ICD-10-CM | POA: Diagnosis not present

## 2021-10-07 DIAGNOSIS — I482 Chronic atrial fibrillation, unspecified: Secondary | ICD-10-CM | POA: Diagnosis not present

## 2021-10-07 DIAGNOSIS — I110 Hypertensive heart disease with heart failure: Secondary | ICD-10-CM | POA: Diagnosis not present

## 2021-10-07 DIAGNOSIS — L89623 Pressure ulcer of left heel, stage 3: Secondary | ICD-10-CM | POA: Diagnosis not present

## 2021-10-07 DIAGNOSIS — Z4800 Encounter for change or removal of nonsurgical wound dressing: Secondary | ICD-10-CM | POA: Diagnosis not present

## 2021-10-07 DIAGNOSIS — I2510 Atherosclerotic heart disease of native coronary artery without angina pectoris: Secondary | ICD-10-CM | POA: Diagnosis not present

## 2021-10-08 DIAGNOSIS — N186 End stage renal disease: Secondary | ICD-10-CM | POA: Diagnosis not present

## 2021-10-08 DIAGNOSIS — Z992 Dependence on renal dialysis: Secondary | ICD-10-CM | POA: Diagnosis not present

## 2021-10-08 DIAGNOSIS — N2581 Secondary hyperparathyroidism of renal origin: Secondary | ICD-10-CM | POA: Diagnosis not present

## 2021-10-10 ENCOUNTER — Ambulatory Visit (INDEPENDENT_AMBULATORY_CARE_PROVIDER_SITE_OTHER): Payer: Medicare Other

## 2021-10-10 ENCOUNTER — Ambulatory Visit (INDEPENDENT_AMBULATORY_CARE_PROVIDER_SITE_OTHER): Payer: Medicare Other | Admitting: Orthopedic Surgery

## 2021-10-10 ENCOUNTER — Other Ambulatory Visit: Payer: Self-pay

## 2021-10-10 DIAGNOSIS — G8929 Other chronic pain: Secondary | ICD-10-CM | POA: Diagnosis not present

## 2021-10-10 DIAGNOSIS — M25561 Pain in right knee: Secondary | ICD-10-CM

## 2021-10-10 DIAGNOSIS — I25119 Atherosclerotic heart disease of native coronary artery with unspecified angina pectoris: Secondary | ICD-10-CM

## 2021-10-10 DIAGNOSIS — N186 End stage renal disease: Secondary | ICD-10-CM | POA: Diagnosis not present

## 2021-10-10 DIAGNOSIS — Z992 Dependence on renal dialysis: Secondary | ICD-10-CM | POA: Diagnosis not present

## 2021-10-10 DIAGNOSIS — N2581 Secondary hyperparathyroidism of renal origin: Secondary | ICD-10-CM | POA: Diagnosis not present

## 2021-10-11 ENCOUNTER — Encounter: Payer: Self-pay | Admitting: Orthopedic Surgery

## 2021-10-11 ENCOUNTER — Other Ambulatory Visit: Payer: Self-pay | Admitting: Family Medicine

## 2021-10-11 ENCOUNTER — Ambulatory Visit: Payer: Medicare Other | Admitting: Family Medicine

## 2021-10-11 DIAGNOSIS — G8929 Other chronic pain: Secondary | ICD-10-CM

## 2021-10-11 DIAGNOSIS — E1122 Type 2 diabetes mellitus with diabetic chronic kidney disease: Secondary | ICD-10-CM

## 2021-10-11 DIAGNOSIS — M25561 Pain in right knee: Secondary | ICD-10-CM | POA: Diagnosis not present

## 2021-10-11 DIAGNOSIS — N184 Chronic kidney disease, stage 4 (severe): Secondary | ICD-10-CM

## 2021-10-11 MED ORDER — LIDOCAINE HCL (PF) 1 % IJ SOLN
5.0000 mL | INTRAMUSCULAR | Status: AC | PRN
Start: 2021-10-11 — End: 2021-10-11
  Administered 2021-10-11: 5 mL

## 2021-10-11 MED ORDER — METHYLPREDNISOLONE ACETATE 40 MG/ML IJ SUSP
40.0000 mg | INTRAMUSCULAR | Status: AC | PRN
  Administered 2021-10-11: 40 mg via INTRA_ARTICULAR

## 2021-10-11 NOTE — Progress Notes (Signed)
Office Visit Note   Patient: Louis [name]           Date of Birth: 24-Aug-1946           MRN: [id number] Visit Date: 10/10/2021              Requested by: [name], MD [address] [place],  [place] [id number] PCP: [name], MD  Chief Complaint  Patient presents with   Right Knee - Pain   Right Leg - Pain      HPI: Patient is a 75 year old gentleman who recently has had blunt trauma to the right leg.States he has been hospitalized in August and in rehab for 3 months for C. difficile.  Patient complains of increasing right knee pain.  Assessment & Plan: Visit Diagnoses:  1. Chronic pain of right knee     Plan: Right knee was injected he tolerated this well patient states he currently has home health nursing evaluating the left foot.  He will follow-up as needed if the symptoms recur in the right knee.  Follow-Up Instructions: Return if symptoms worsen or fail to improve.   Ortho Exam  Patient is alert, oriented, no adenopathy, well-dressed, normal affect, normal respiratory effort. Examination patient has an antalgic gait he has venous stasis swelling worse in the right lower extremity.  He is currently wearing a double upright brace and a compression sock on the right.  He has tenderness to palpation of the medial lateral joint line of the right knee collaterals and cruciates are stable.  There is a mild effusion.  No cellulitis no abrasions.  Imaging: No results found. No images are attached to the encounter.  Labs: Lab Results  Component Value Date   HGBA1C 6.6 (A) 03/18/2021   HGBA1C 5.8 (A) 12/17/2020   HGBA1C 6.7 (A) 09/15/2020   ESRSEDRATE 81 (H) 02/17/2016   ESRSEDRATE 71 (H) 04/20/2015   CRP 7.0 (H) 02/17/2016   LABURIC 5.2 09/20/2020   LABURIC 2.9 (L) 04/29/2019   LABURIC 5.7 12/12/2018   REPTSTATUS 10/31/2019 FINAL 10/27/2019   GRAMSTAIN  10/27/2019    FEW WBC PRESENT, PREDOMINANTLY MONONUCLEAR NO ORGANISMS  SEEN    CULT  10/27/2019    NO GROWTH 3 DAYS Performed at [facility], [place] [address] [place], [place] [address]    LABORGA ENTEROCOCCUS SPECIES 01/28/2016     Lab Results  Component Value Date   ALBUMIN 2.9 (A) 09/09/2020   ALBUMIN 3.1 (L) 10/27/2019   ALBUMIN 3.4 (L) 12/17/2018    Lab Results  Component Value Date   MG 2.2 12/17/2018   MG 2.4 12/16/2018   MG 2.0 02/02/2017   Lab Results  Component Value Date   VD25OH 55 02/17/2016    No results found for: PREALBUMIN CBC EXTENDED Latest Ref Rng & Units 10/27/2020 09/09/2020 09/01/2020  WBC - - 11.4 -  RBC 3.87 - 5.11 - 3.48(A) -  HGB 13.0 - 17.0 g/dL 12.9(L) 11.3(A) 14.6  HCT 39.0 - 52.0 % 38.0(L) 35(A) 43.0  PLT 140 - 400 Thousand/uL - - -  NEUTROABS 1.7 - 7.7 K/uL - - -  LYMPHSABS 0.7 - 4.0 K/uL - - -     There is no height or weight on file to calculate BMI.  Orders:  Orders Placed This Encounter  Procedures   XR Knee 1-2 Views Right   No orders of the defined types were placed in this encounter.    Procedures:  Large Joint Inj: R knee on 10/11/2021 8:50 AM Indications: pain and diagnostic evaluation Details: 22 G 1.5 in needle, anteromedial approach  Arthrogram: No  Medications: 5 mL lidocaine (PF) 1 %; 40 mg methylPREDNISolone acetate 40 MG/ML Outcome: tolerated well, no immediate complications Procedure, treatment alternatives, risks and benefits explained, specific risks discussed. Consent was given by the patient. Immediately prior to procedure a time out was called to verify the correct patient, procedure, equipment, support staff and site/side marked as required. Patient was prepped and draped in the usual sterile fashion.     Clinical Data: No additional findings.  ROS:  All other systems negative, except as noted in the HPI. Review of Systems  Objective: Vital Signs: There were no vitals taken for this visit.  Specialty Comments:  No specialty comments available.  PMFS  History: Patient Active Problem List   Diagnosis Date Noted   Gait instability 11/17/2020   Allergy, unspecified, initial encounter 07/14/2020   Trigger finger, right middle finger 02/03/2020   Pain in both hands 02/03/2020   Anticoagulant long-term use 01/06/2020   Iron deficiency anemia, unspecified 09/01/2019   Unspecified jaundice 08/27/2019   Other long term (current) drug therapy 08/27/2019   Liver disease, unspecified 08/27/2019   Internal hemorrhoids 06/05/2019   Poor mobility 05/02/2019   Hypertensive left ventricular hypertrophy, without heart failure 05/02/2019   Secondary hyperparathyroidism of renal origin ([place]) 01/25/2019   Atrial fibrillation ([place]) 12/23/2018   Other specified coagulation defects ([place]) 12/23/2018   Mechanical complication of cardiovascular device 12/23/2018   Anemia in chronic kidney disease 12/20/2018   Anasarca 12/14/2018   SOB (shortness of breath) on exertion 12/12/2018   Bilateral pseudophakia 02/27/2018   Venous insufficiency (chronic) (peripheral) 09/06/2017   Diabetic peripheral neuropathy ([place]) 06/12/2017   Charcot gait 02/02/2017   Charcot's joint 02/02/2017   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 ([place]) 01/08/2017   Diabetic polyneuropathy associated with type 2 diabetes mellitus ([place]) 12/25/2016   Idiopathic chronic venous hypertension of both lower extremities with inflammation 12/25/2016   Combined forms of age-related cataract of both eyes 10/19/2016   Cataract, right 05/18/2016   Right cervical radiculopathy 05/12/2016   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 ([place]) 10/12/2015   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) 10/05/2014   History of colonic polyps 01/28/2014    Bilateral carpal tunnel syndrome 01/09/2014   Macular edema, diabetic ([place]) 10/31/2013   Obesity (BMI 30-39.9) 07/31/2013   Astigmatism 01/11/2012   Presbyopia 01/11/2012   Atherosclerosis of native coronary artery of native heart with angina pectoris ([place]) 05/17/2011   BMI 32.0-32.9,adult 03/01/2011   Bradycardia 02/08/2011   Right knee pain 02/08/2011   Hyperlipidemia    RBBB 02/01/2011   ESRD (end stage renal disease) ([place]) 10/23/2006   Proteinuria 10/23/2006   Obstructive sleep apnea 10/05/2006   CKD stage 5 due to type 2 diabetes mellitus ([place]) 08/28/2006   HYPERTENSION, BENIGN SYSTEMIC 08/28/2006   Past Medical History:  Diagnosis Date   Arthritis    Brittle bones    per pt, has soft bones in right foot/wears boot cast!   CAD (coronary artery disease)    Cancer ([place])    skin cancer on arm   Cataract    Bil/ surg scheduled for right eye 01/18/17/  left eye 02/08/17   Charcot ankle, right 2019   CHF (congestive heart failure) ([place]) 2015   Diabetes mellitus    Type 2   Heart failure, diastolic (HCC)    History of kidney stones    Hyperlipidemia    Hypertension    Macular degeneration disease    Macular edema 2014   OSA on CPAP    Paroxysmal atrial fibrillation (HCC)    Personal history of colonic polyps - adenomas 01/28/2014   Renal insufficiency    Tu/Th/Sa Dialysis   Shortness of breath dyspnea    Syncope and collapse     Family History  Problem Relation Age of Onset   Diabetes Mother    Kidney disease Mother    Diabetes Father    Coronary artery disease Father    Diabetes Brother    Diabetes Sister    Prostate cancer Maternal Uncle    Diabetes Maternal Grandmother    Cancer Paternal Grandmother        unknown   Heart attack Paternal Grandfather    Colon cancer Neg Hx     Past Surgical History:  Procedure Laterality Date   AV FISTULA PLACEMENT Left 09/01/2020   Procedure: LEFT ARM ARTERIOVENOUS (AV) FISTULA;  Surgeon: Waynetta Sandy, MD;   Location: [facility];  Service: Vascular;  Laterality: Left;   [place] Left 10/27/2020   Procedure: LEFT SECOND STAGE BASILIC VEIN FISTULA TRANSPOSITION;  Surgeon: Waynetta Sandy, MD;  Location: [facility];  Service: Vascular;  Laterality: Left;   CARPAL TUNNEL RELEASE     left hand   COLONOSCOPY     INTRAVASCULAR PRESSURE WIRE/FFR STUDY N/A 12/18/2018   Procedure: INTRAVASCULAR PRESSURE WIRE/FFR STUDY;  Surgeon: Wellington Hampshire, MD;  Location: [facility];  Service: Cardiovascular;  Laterality: N/A;   IR FLUORO GUIDE CV LINE RIGHT  12/16/2018   IR US GUIDE VASC ACCESS RIGHT  12/16/2018   KNEE ARTHROSCOPY Right 09/13/2016   [facility], Dr. Dorna Leitz   PILONIDAL CYST EXCISION     RIGHT/LEFT HEART CATH AND CORONARY ANGIOGRAPHY N/A 12/18/2018   Procedure: RIGHT/LEFT HEART CATH AND CORONARY ANGIOGRAPHY;  Surgeon: Wellington Hampshire, MD;  Location: [facility];  Service: Cardiovascular;  Laterality: N/A;   Social History   Occupational History   Occupation: Warehouse    Comment: retired  Tobacco Use   Smoking status: Former    Packs/day: 0.50    Years: 20.00    Pack years: 10.00    Types: Cigarettes    Quit date: 03/06/1977    Years since quitting: 44.6   Smokeless tobacco: Never  Vaping Use   Vaping Use: Never used  Substance and Sexual Activity   Alcohol use: No   Drug use: No   Sexual activity: Not Currently

## 2021-10-12 DIAGNOSIS — Z4800 Encounter for change or removal of nonsurgical wound dressing: Secondary | ICD-10-CM | POA: Diagnosis not present

## 2021-10-12 DIAGNOSIS — Z992 Dependence on renal dialysis: Secondary | ICD-10-CM | POA: Diagnosis not present

## 2021-10-12 DIAGNOSIS — I2510 Atherosclerotic heart disease of native coronary artery without angina pectoris: Secondary | ICD-10-CM | POA: Diagnosis not present

## 2021-10-12 DIAGNOSIS — L89623 Pressure ulcer of left heel, stage 3: Secondary | ICD-10-CM | POA: Diagnosis not present

## 2021-10-12 DIAGNOSIS — N2581 Secondary hyperparathyroidism of renal origin: Secondary | ICD-10-CM | POA: Diagnosis not present

## 2021-10-12 DIAGNOSIS — I482 Chronic atrial fibrillation, unspecified: Secondary | ICD-10-CM | POA: Diagnosis not present

## 2021-10-12 DIAGNOSIS — A0471 Enterocolitis due to Clostridium difficile, recurrent: Secondary | ICD-10-CM | POA: Diagnosis not present

## 2021-10-12 DIAGNOSIS — N186 End stage renal disease: Secondary | ICD-10-CM | POA: Diagnosis not present

## 2021-10-12 DIAGNOSIS — I110 Hypertensive heart disease with heart failure: Secondary | ICD-10-CM | POA: Diagnosis not present

## 2021-10-14 DIAGNOSIS — Z4800 Encounter for change or removal of nonsurgical wound dressing: Secondary | ICD-10-CM | POA: Diagnosis not present

## 2021-10-14 DIAGNOSIS — I482 Chronic atrial fibrillation, unspecified: Secondary | ICD-10-CM | POA: Diagnosis not present

## 2021-10-14 DIAGNOSIS — A0471 Enterocolitis due to Clostridium difficile, recurrent: Secondary | ICD-10-CM | POA: Diagnosis not present

## 2021-10-14 DIAGNOSIS — I110 Hypertensive heart disease with heart failure: Secondary | ICD-10-CM | POA: Diagnosis not present

## 2021-10-14 DIAGNOSIS — L89623 Pressure ulcer of left heel, stage 3: Secondary | ICD-10-CM | POA: Diagnosis not present

## 2021-10-14 DIAGNOSIS — I2510 Atherosclerotic heart disease of native coronary artery without angina pectoris: Secondary | ICD-10-CM | POA: Diagnosis not present

## 2021-10-15 DIAGNOSIS — N2581 Secondary hyperparathyroidism of renal origin: Secondary | ICD-10-CM | POA: Diagnosis not present

## 2021-10-15 DIAGNOSIS — Z992 Dependence on renal dialysis: Secondary | ICD-10-CM | POA: Diagnosis not present

## 2021-10-15 DIAGNOSIS — N186 End stage renal disease: Secondary | ICD-10-CM | POA: Diagnosis not present

## 2021-10-16 ENCOUNTER — Other Ambulatory Visit: Payer: Self-pay | Admitting: Family Medicine

## 2021-10-17 DIAGNOSIS — I110 Hypertensive heart disease with heart failure: Secondary | ICD-10-CM | POA: Diagnosis not present

## 2021-10-17 DIAGNOSIS — Z4800 Encounter for change or removal of nonsurgical wound dressing: Secondary | ICD-10-CM | POA: Diagnosis not present

## 2021-10-17 DIAGNOSIS — A0471 Enterocolitis due to Clostridium difficile, recurrent: Secondary | ICD-10-CM | POA: Diagnosis not present

## 2021-10-17 DIAGNOSIS — I482 Chronic atrial fibrillation, unspecified: Secondary | ICD-10-CM | POA: Diagnosis not present

## 2021-10-17 DIAGNOSIS — L89623 Pressure ulcer of left heel, stage 3: Secondary | ICD-10-CM | POA: Diagnosis not present

## 2021-10-17 DIAGNOSIS — I2510 Atherosclerotic heart disease of native coronary artery without angina pectoris: Secondary | ICD-10-CM | POA: Diagnosis not present

## 2021-10-18 DIAGNOSIS — N2581 Secondary hyperparathyroidism of renal origin: Secondary | ICD-10-CM | POA: Diagnosis not present

## 2021-10-18 DIAGNOSIS — N186 End stage renal disease: Secondary | ICD-10-CM | POA: Diagnosis not present

## 2021-10-18 DIAGNOSIS — Z992 Dependence on renal dialysis: Secondary | ICD-10-CM | POA: Diagnosis not present

## 2021-10-19 ENCOUNTER — Ambulatory Visit (INDEPENDENT_AMBULATORY_CARE_PROVIDER_SITE_OTHER): Payer: Medicare Other | Admitting: Family Medicine

## 2021-10-19 ENCOUNTER — Other Ambulatory Visit: Payer: Self-pay

## 2021-10-19 VITALS — BP 138/63 | HR 46 | Ht 67.0 in | Wt 223.3 lb

## 2021-10-19 DIAGNOSIS — Z8619 Personal history of other infectious and parasitic diseases: Secondary | ICD-10-CM | POA: Diagnosis not present

## 2021-10-19 DIAGNOSIS — I10 Essential (primary) hypertension: Secondary | ICD-10-CM

## 2021-10-19 DIAGNOSIS — I2510 Atherosclerotic heart disease of native coronary artery without angina pectoris: Secondary | ICD-10-CM | POA: Diagnosis not present

## 2021-10-19 DIAGNOSIS — E08621 Diabetes mellitus due to underlying condition with foot ulcer: Secondary | ICD-10-CM | POA: Diagnosis not present

## 2021-10-19 DIAGNOSIS — I482 Chronic atrial fibrillation, unspecified: Secondary | ICD-10-CM | POA: Diagnosis not present

## 2021-10-19 DIAGNOSIS — T887XXA Unspecified adverse effect of drug or medicament, initial encounter: Secondary | ICD-10-CM

## 2021-10-19 DIAGNOSIS — E11610 Type 2 diabetes mellitus with diabetic neuropathic arthropathy: Secondary | ICD-10-CM

## 2021-10-19 DIAGNOSIS — L97429 Non-pressure chronic ulcer of left heel and midfoot with unspecified severity: Secondary | ICD-10-CM | POA: Diagnosis not present

## 2021-10-19 DIAGNOSIS — A0471 Enterocolitis due to Clostridium difficile, recurrent: Secondary | ICD-10-CM | POA: Diagnosis not present

## 2021-10-19 DIAGNOSIS — Z4800 Encounter for change or removal of nonsurgical wound dressing: Secondary | ICD-10-CM | POA: Diagnosis not present

## 2021-10-19 DIAGNOSIS — L89623 Pressure ulcer of left heel, stage 3: Secondary | ICD-10-CM | POA: Diagnosis not present

## 2021-10-19 DIAGNOSIS — I25119 Atherosclerotic heart disease of native coronary artery with unspecified angina pectoris: Secondary | ICD-10-CM | POA: Diagnosis not present

## 2021-10-19 DIAGNOSIS — I110 Hypertensive heart disease with heart failure: Secondary | ICD-10-CM | POA: Diagnosis not present

## 2021-10-19 NOTE — Assessment & Plan Note (Signed)
Forwarding will need to be very cautious about antibiotic use.

## 2021-10-19 NOTE — Assessment & Plan Note (Signed)
We did not check an A1c today but he is back home and eating normally.  He did bring in his blood glucose log for me to review today he has had 1 hypoglycemic event okay to start going back to sliding scale which she was doing really well with previously.

## 2021-10-19 NOTE — Addendum Note (Signed)
Addended by: [name] D on: 10/19/2021 03:23 PM   Modules accepted: Orders

## 2021-10-19 NOTE — Assessment & Plan Note (Signed)
Currently being managed by wound care through home health.  I am happy to step in any point if they have any concerns.  His wife had been applying a honey mixture and changing the dressing daily up until yesterday.  She feels like overall it is getting much smaller and is improving.

## 2021-10-19 NOTE — Assessment & Plan Note (Signed)
At goal.  

## 2021-10-19 NOTE — Progress Notes (Addendum)
 Established Patient Office Visit  Subjective:  Patient ID: Louis [name], male    DOB: 05/07/1946  Age: 75 y.o. MRN: [id number]  CC:  Chief Complaint  Patient presents with   Follow-up    HPI [name] presents for hospital follow-up.  He unfortunately has had a pretty complicated course.  He was admitted to [facility] on August 16 and discharged home on September 23 for total hospitalization of 39 days.  He was then discharged to a skilled nursing facility.  He was admitted for severe C. difficile colitis and COVID-19 infection and sepsis.  He was initially treated with vancomycin and then had an allergic reaction and became very short of breath.  They then changed his medications.  He has end-stage renal disease so continued his dialysis during hospitalization.  He vomited blood while in the hospital.  Felt to be secondary to erosive esophagitis.  They discontinued his warfarin and switched him to Eliquis.  He was anemic with a hemoglobin of 7 on September 10 and so he was transfused 1 unit of blood.  He is now on that since discharge and his wife says that he will likely need a refill in a couple of weeks.  Since being back home he is overall doing well he has been getting some physical therapy and so has been able to move from room to room.  Still is quite weak.  He is getting back on his normal diet he felt like the food at the SNF was horrible and they were not giving him sliding scale insulin.  A left heel wound during the stay at the SNF and has been getting some treatment with home health.  They put some type of colloidal bandage on it yesterday and they are not supposed to remove it until Thursday and then send pictures to the home health nurse.  Diabetes - no hypoglycemic events. No wounds or sores that are not healing well. No increased thirst or urination. Checking glucose at home. Taking medications as prescribed without any side effects. Using Freestyle Libre.  Did notice a  difference between the readings and fingerstick readings.   Past Medical History:  Diagnosis Date   Arthritis    Brittle bones    per pt, has soft bones in right foot/wears boot cast!   CAD (coronary artery disease)    Cancer (HCC)    skin cancer on arm   Cataract    Bil/ surg scheduled for right eye 01/18/17/ left eye 02/08/17   Charcot ankle, right 2019   CHF (congestive heart failure) (HCC) 2015   Diabetes mellitus    Type 2   Heart failure, diastolic (HCC)    History of kidney stones    Hyperlipidemia    Hypertension    Macular degeneration disease    Macular edema 2014   OSA on CPAP    Paroxysmal atrial fibrillation (HCC)    Personal history of colonic polyps - adenomas 01/28/2014   Renal insufficiency    Tu/Th/Sa Dialysis   Shortness of breath dyspnea    Syncope and collapse     Past Surgical History:  Procedure Laterality Date   AV FISTULA PLACEMENT Left 09/01/2020   Procedure: LEFT ARM ARTERIOVENOUS (AV) FISTULA;  Surgeon: Cain, Brandon Christopher, MD;  [facility];  Service: Vascular;  Laterality: Left;   BASCILIC VEIN TRANSPOSITION Left 10/27/2020   Procedure: LEFT SECOND STAGE BASILIC VEIN FISTULA TRANSPOSITION;  Surgeon: Cain, Brandon Christopher, MD;  Location[facility];  Service: Vascular;  Laterality: Left;   CARPAL TUNNEL RELEASE     left hand   COLONOSCOPY     INTRAVASCULAR PRESSURE WIRE/FFR STUDY N/A 12/18/2018   Procedure: INTRAVASCULAR PRESSURE WIRE/FFR STUDY;  Surgeon: Wellington Hampshire, MD;  Location: [facility];  Service: Cardiovascular;  Laterality: N/A;   IR FLUORO GUIDE CV LINE RIGHT  12/16/2018   IR US GUIDE VASC ACCESS RIGHT  12/16/2018   KNEE ARTHROSCOPY Right 09/13/2016   [facility], Dr. Dorna Leitz   PILONIDAL CYST EXCISION     RIGHT/LEFT HEART CATH AND CORONARY ANGIOGRAPHY N/A 12/18/2018   Procedure: RIGHT/LEFT HEART CATH AND CORONARY ANGIOGRAPHY;  Surgeon: Wellington Hampshire, MD;  Location: [facility];  Service:  Cardiovascular;  Laterality: N/A;    Family History  Problem Relation Age of Onset   Diabetes Mother    Kidney disease Mother    Diabetes Father    Coronary artery disease Father    Diabetes Brother    Diabetes Sister    Prostate cancer Maternal Uncle    Diabetes Maternal Grandmother    Cancer Paternal Grandmother        unknown   Heart attack Paternal Grandfather    Colon cancer Neg Hx     Social History   Socioeconomic History   Marital status: Married    Spouse name: [name]   Number of children: 5   Years of education: 12   Highest education level: 12th grade  Occupational History   Occupation: Warehouse    Comment: retired  Tobacco Use   Smoking status: Former    Packs/day: 0.50    Years: 20.00    Pack years: 10.00    Types: Cigarettes    Quit date: 03/06/1977    Years since quitting: 44.6   Smokeless tobacco: Never  Vaping Use   Vaping Use: Never used  Substance and Sexual Activity   Alcohol use: No   Drug use: No   Sexual activity: Not Currently  Other Topics Concern   Not on file  Social History Narrative   Married wife has metastatic breast cancer   Retired Proofreader work   5 children   Former smoker no alcohol tobacco or drug use   Was very active with his church group is a coffee drinker as well   Social Determinants of Sales executive: Low Risk    Difficulty of Paying Living Expenses: Not hard at all  Food Insecurity: No Food Insecurity   Worried About Charity fundraiser in the Last Year: Never true   Arboriculturist in the Last Year: Never true  Transportation Needs: No Transportation Needs   Lack of Transportation (Medical): No   Lack of Transportation (Non-Medical): No  Physical Activity: Inactive   Days of Exercise per Week: 0 days   Minutes of Exercise per Session: 0 min  Stress: No Stress Concern Present   Feeling of Stress : Not at all  Social Connections: Moderately Integrated   Frequency of Communication with  Friends and Family: Once a week   Frequency of Social Gatherings with Friends and Family: More than three times a week   Attends Religious Services: More than 4 times per year   Active Member of [employer] or Organizations: No   Attends Archivist Meetings: Never   Marital Status: Married  Human resources officer Violence: Not At Risk   Fear of Current or Ex-Partner: No   Emotionally  Abused: No   Physically Abused: No   Sexually Abused: No    Outpatient Medications Prior to Visit  Medication Sig Dispense Refill   allopurinol (ZYLOPRIM) 100 MG tablet Take 1 tablet (100 mg total) by mouth daily. 1 tablet 0   AMBULATORY NON FORMULARY MEDICATION Continuous positive airway pressure (CPAP) device: Auto titrate minimum 4 cm H20 to maximum of 20 cm H2O with pressure. Please provide all supplemental supplies as needed. Fax to: [phone] 1 Units 1   AMBULATORY NON FORMULARY MEDICATION CPAP supplies - Disposable filters (white), Replaceable pillow or cushions, Mask and Tubing as needed. 1 each prn   B-D INS SYR ULTRAFINE 1CC/31G 31G X 5/16" 1 ML MISC      blood glucose meter kit and supplies Use to check blood sugars daily E11.65     Cholecalciferol (VITAMIN D3) 50 MCG (2000 UT) TABS Take 2,000 Units by mouth daily.      Coenzyme Q10 (COQ10) 200 MG CAPS Take 200 mg by mouth daily.     Continuous Blood Gluc Receiver (FREESTYLE LIBRE 14 DAY READER) DEVI Dx DM E11.22 Check blood sugar 4 times daily. 1 each prn   Continuous Blood Gluc Sensor (FREESTYLE LIBRE 14 DAY SENSOR) MISC Dx DM E11.22 Check blood sugar 4 times daily. 1 each prn   ELIQUIS 5 MG TABS tablet Take 5 mg by mouth 2 (two) times daily.     fluconazole (DIFLUCAN) 200 MG tablet Take 200 mg by mouth. Once a week.     furosemide (LASIX) 80 MG tablet Take 2 tablets (160 mg total) by mouth 2 (two) times daily. Morning & after lunch 360 tablet 1   gabapentin (NEURONTIN) 300 MG capsule Take 1 capsule by mouth once daily 90 capsule 0   hydrALAZINE  (APRESOLINE) 100 MG tablet TAKE 1 TABLET BY MOUTH THREE TIMES DAILY 270 tablet 2   isosorbide dinitrate (ISORDIL) 20 MG tablet Take 1 tablet (20 mg total) by mouth 2 (two) times daily. 180 tablet 3   lidocaine-prilocaine (EMLA) cream SMARTSIG:Sparingly Topical     Methoxy PEG-Epoetin Beta (MIRCERA IJ) Mircera     mupirocin ointment (BACTROBAN) 2 % APPLY TOPICALLY TWICE DAILY. APPLY TO INSIDE OF EACH NARES DAILY FOR 10 DAYS THEN TWICE A WEEK FOR MAINTENANCE. 22 g 0   nitroGLYCERIN (NITROSTAT) 0.4 MG SL tablet Place 1 tablet (0.4 mg total) under the tongue every 5 (five) minutes as needed for chest pain. 25 tablet 11   NOVOLIN N 100 UNIT/ML injection Inject 20-30 Units into the skin See admin instructions. Inject 30 units subcutaneously in the morning & inject 20-30 units subcutaneously at bedtime (depending on blood sugar)     NOVOLIN R 100 UNIT/ML injection Inject 8-20 Units into the skin 3 (three) times daily with meals. Sliding Scale  5   omega-3 acid ethyl esters (LOVAZA) 1 g capsule Take 2 g by mouth daily.      ONETOUCH ULTRA test strip USE 1 STRIP TO CHECK GLUCOSE THREE TIMES DAILY 300 each 0   Probiotic Product (PROBIOTIC DAILY PO) Take 420 mg by mouth daily.     rosuvastatin (CRESTOR) 40 MG tablet Take 1 tablet by mouth once daily 90 tablet 3   triamcinolone ointment (KENALOG) 0.5 % Apply 1 application topically 2 (two) times daily. To affected area, avoid eyes and face 30 g 3   Vitamin Mixture (VITAMIN E COMPLETE PO) Take 1 capsule by mouth daily. Vitamin E complex     ZINC SULFATE PO Take 1   tablet by mouth daily.      warfarin (COUMADIN) 5 MG tablet TAKE 1 TABLET BY MOUTH ONCE DAILY AT  6PM  MONITOR  INR  GOAL  SHOULD  BE  BETWEEN  2-3 (Patient taking differently: TAKE 1 TABLET BY MOUTH ONCE DAILY AT  6PM  MONITOR  INR  GOAL  SHOULD  BE  BETWEEN  2-3  Per his wife, he is currently taking 2.5 mg Tuesday and Friday. 5mg on the other days) 90 tablet prn   Facility-Administered Medications  Prior to Visit  Medication Dose Route Frequency Provider Last Rate Last Admin   0.9 %  sodium chloride infusion  500 mL Intravenous Once Gessner, Carl E, MD        Allergies  Allergen Reactions   Hydrocodone Nausea And Vomiting    Other reaction(s): GI Upset (intolerance) Projectile vomiting    Oxycodone Nausea And Vomiting    Other reaction(s): GI Upset (intolerance), Vomiting (intolerance) Projectile vomiting    Vancomycin Shortness Of Breath    ORAL VANCOMYCIN for C diff.   Dacarbazine Other (See Comments)    Unknown reaction   Tape Dermatitis, Itching and Rash    Patch used at dialysis    ROS Review of Systems    Objective:    Physical Exam Constitutional:      Appearance: Normal appearance. He is well-developed.  HENT:     Head: Normocephalic and atraumatic.  Cardiovascular:     Rate and Rhythm: Normal rate and regular rhythm.     Heart sounds: Normal heart sounds.  Pulmonary:     Effort: Pulmonary effort is normal.     Breath sounds: Normal breath sounds.  Skin:    General: Skin is warm and dry.  Neurological:     Mental Status: He is alert and oriented to person, place, and time. Mental status is at baseline.  Psychiatric:        Behavior: Behavior normal.    BP 138/63   Pulse (!) 46   Ht 5' 7" (1.702 m)   Wt 223 lb 5.2 oz (101.3 kg)   SpO2 100%   BMI 34.98 kg/m  Wt Readings from Last 3 Encounters:  10/19/21 223 lb 5.2 oz (101.3 kg)  03/14/21 235 lb 6.4 oz (106.8 kg)  03/02/21 231 lb (104.8 kg)     Health Maintenance Due  Topic Date Due   COVID-19 Vaccine (1) Never done   Zoster Vaccines- Shingrix (1 of 2) Never done   INFLUENZA VACCINE  06/20/2021   HEMOGLOBIN A1C  09/17/2021    There are no preventive care reminders to display for this patient.  Lab Results  Component Value Date   TSH 2.391 02/02/2017   Lab Results  Component Value Date   WBC 11.4 09/09/2020   HGB 12.9 (L) 10/27/2020   HCT 38.0 (L) 10/27/2020   MCV 101  09/09/2020   PLT 234 03/08/2020   Lab Results  Component Value Date   NA 135 10/27/2020   K 4.1 10/27/2020   CO2 27 10/27/2019   GLUCOSE 184 (H) 10/27/2020   BUN 43 (H) 10/27/2020   CREATININE 3.90 (H) 10/27/2020   BILITOT 0.8 05/28/2020   ALKPHOS 105 10/27/2019   AST 23 05/28/2020   ALT 17 05/28/2020   PROT 6.2 05/28/2020   ALBUMIN 2.9 (A) 09/09/2020   CALCIUM 9.2 10/27/2019   ANIONGAP 13 10/27/2019   GFR 38.90 (L) 03/10/2011   Lab Results  Component Value Date   CHOL 118   05/28/2020   Lab Results  Component Value Date   HDL 38 (L) 05/28/2020   Lab Results  Component Value Date   LDLCALC 56 05/28/2020   Lab Results  Component Value Date   TRIG 161 (H) 05/28/2020   Lab Results  Component Value Date   CHOLHDL 3.1 05/28/2020   Lab Results  Component Value Date   HGBA1C 6.6 (A) 03/18/2021      Assessment & Plan:   Problem List Items Addressed This Visit       Cardiovascular and Mediastinum   HYPERTENSION, BENIGN SYSTEMIC    At goal.      Relevant Medications   ELIQUIS 5 MG TABS tablet   Atrial fibrillation (HCC)    Off of Coumadin and now on Eliquis.  We will get his chart updated will need refills in a couple of weeks.      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  He did bring in his blood glucose log for me to review today he has had 1 hypoglycemic event okay to start going back to sliding scale which she was doing really well with previously.      Diabetic ulcer of left heel associated with diabetes mellitus due to underlying condition [facility]) - Primary    Currently being managed by wound care through home health.  I am happy to step in any point if they have any concerns.  His wife had been applying a honey mixture and changing the dressing daily up until yesterday.  She feels like  overall it is getting much smaller and is improving.        Other   History of Clostridioides difficile colitis    Forwarding will need to be very cautious about antibiotic use.      Other Visit Diagnoses     Medication side effect           Medication side effect-added vancomycin to allergy list.  No orders of the defined types were placed in this encounter.   Follow-up: Return in about 3 months (around 01/17/2022) for Diabetes follow-up.   I spent 45 minutes on the day of the encounter to include pre-visit record review, face-to-face time with the patient and post visit ordering of test.   [name], MD

## 2021-10-19 NOTE — Assessment & Plan Note (Signed)
Off of Coumadin and now on Eliquis.  We will get his chart updated will need refills in a couple of weeks.

## 2021-10-20 DIAGNOSIS — L89623 Pressure ulcer of left heel, stage 3: Secondary | ICD-10-CM | POA: Diagnosis not present

## 2021-10-20 DIAGNOSIS — I2510 Atherosclerotic heart disease of native coronary artery without angina pectoris: Secondary | ICD-10-CM | POA: Diagnosis not present

## 2021-10-20 DIAGNOSIS — Z992 Dependence on renal dialysis: Secondary | ICD-10-CM | POA: Diagnosis not present

## 2021-10-20 DIAGNOSIS — Z4800 Encounter for change or removal of nonsurgical wound dressing: Secondary | ICD-10-CM | POA: Diagnosis not present

## 2021-10-20 DIAGNOSIS — A0471 Enterocolitis due to Clostridium difficile, recurrent: Secondary | ICD-10-CM | POA: Diagnosis not present

## 2021-10-20 DIAGNOSIS — D509 Iron deficiency anemia, unspecified: Secondary | ICD-10-CM | POA: Diagnosis not present

## 2021-10-20 DIAGNOSIS — I110 Hypertensive heart disease with heart failure: Secondary | ICD-10-CM | POA: Diagnosis not present

## 2021-10-20 DIAGNOSIS — N2581 Secondary hyperparathyroidism of renal origin: Secondary | ICD-10-CM | POA: Diagnosis not present

## 2021-10-20 DIAGNOSIS — E1122 Type 2 diabetes mellitus with diabetic chronic kidney disease: Secondary | ICD-10-CM | POA: Diagnosis not present

## 2021-10-20 DIAGNOSIS — N186 End stage renal disease: Secondary | ICD-10-CM | POA: Diagnosis not present

## 2021-10-20 DIAGNOSIS — I482 Chronic atrial fibrillation, unspecified: Secondary | ICD-10-CM | POA: Diagnosis not present

## 2021-10-21 DIAGNOSIS — I2510 Atherosclerotic heart disease of native coronary artery without angina pectoris: Secondary | ICD-10-CM | POA: Diagnosis not present

## 2021-10-21 DIAGNOSIS — A0471 Enterocolitis due to Clostridium difficile, recurrent: Secondary | ICD-10-CM | POA: Diagnosis not present

## 2021-10-21 DIAGNOSIS — I110 Hypertensive heart disease with heart failure: Secondary | ICD-10-CM | POA: Diagnosis not present

## 2021-10-21 DIAGNOSIS — Z4800 Encounter for change or removal of nonsurgical wound dressing: Secondary | ICD-10-CM | POA: Diagnosis not present

## 2021-10-21 DIAGNOSIS — I482 Chronic atrial fibrillation, unspecified: Secondary | ICD-10-CM | POA: Diagnosis not present

## 2021-10-21 DIAGNOSIS — L89623 Pressure ulcer of left heel, stage 3: Secondary | ICD-10-CM | POA: Diagnosis not present

## 2021-10-22 DIAGNOSIS — E1122 Type 2 diabetes mellitus with diabetic chronic kidney disease: Secondary | ICD-10-CM | POA: Diagnosis not present

## 2021-10-22 DIAGNOSIS — N2581 Secondary hyperparathyroidism of renal origin: Secondary | ICD-10-CM | POA: Diagnosis not present

## 2021-10-22 DIAGNOSIS — D509 Iron deficiency anemia, unspecified: Secondary | ICD-10-CM | POA: Diagnosis not present

## 2021-10-22 DIAGNOSIS — N186 End stage renal disease: Secondary | ICD-10-CM | POA: Diagnosis not present

## 2021-10-22 DIAGNOSIS — Z992 Dependence on renal dialysis: Secondary | ICD-10-CM | POA: Diagnosis not present

## 2021-10-24 ENCOUNTER — Other Ambulatory Visit: Payer: Self-pay | Admitting: *Deleted

## 2021-10-24 DIAGNOSIS — A0471 Enterocolitis due to Clostridium difficile, recurrent: Secondary | ICD-10-CM | POA: Diagnosis not present

## 2021-10-24 DIAGNOSIS — I110 Hypertensive heart disease with heart failure: Secondary | ICD-10-CM | POA: Diagnosis not present

## 2021-10-24 DIAGNOSIS — M1000 Idiopathic gout, unspecified site: Secondary | ICD-10-CM

## 2021-10-24 DIAGNOSIS — Z4800 Encounter for change or removal of nonsurgical wound dressing: Secondary | ICD-10-CM | POA: Diagnosis not present

## 2021-10-24 DIAGNOSIS — I482 Chronic atrial fibrillation, unspecified: Secondary | ICD-10-CM | POA: Diagnosis not present

## 2021-10-24 DIAGNOSIS — L89623 Pressure ulcer of left heel, stage 3: Secondary | ICD-10-CM | POA: Diagnosis not present

## 2021-10-24 DIAGNOSIS — I2510 Atherosclerotic heart disease of native coronary artery without angina pectoris: Secondary | ICD-10-CM | POA: Diagnosis not present

## 2021-10-24 DIAGNOSIS — M10031 Idiopathic gout, right wrist: Secondary | ICD-10-CM

## 2021-10-24 MED ORDER — ALLOPURINOL 100 MG PO TABS: 100.0000 mg | ORAL_TABLET | Freq: Every day | ORAL | 1 refills | Status: DC

## 2021-10-24 MED ORDER — ELIQUIS 5 MG PO TABS: 5.0000 mg | ORAL_TABLET | Freq: Two times a day (BID) | ORAL | 3 refills | Status: DC

## 2021-10-25 DIAGNOSIS — Z992 Dependence on renal dialysis: Secondary | ICD-10-CM | POA: Diagnosis not present

## 2021-10-25 DIAGNOSIS — N186 End stage renal disease: Secondary | ICD-10-CM | POA: Diagnosis not present

## 2021-10-25 DIAGNOSIS — E1122 Type 2 diabetes mellitus with diabetic chronic kidney disease: Secondary | ICD-10-CM | POA: Diagnosis not present

## 2021-10-25 DIAGNOSIS — D509 Iron deficiency anemia, unspecified: Secondary | ICD-10-CM | POA: Diagnosis not present

## 2021-10-25 DIAGNOSIS — N2581 Secondary hyperparathyroidism of renal origin: Secondary | ICD-10-CM | POA: Diagnosis not present

## 2021-10-26 DIAGNOSIS — L89623 Pressure ulcer of left heel, stage 3: Secondary | ICD-10-CM | POA: Diagnosis not present

## 2021-10-26 DIAGNOSIS — A0471 Enterocolitis due to Clostridium difficile, recurrent: Secondary | ICD-10-CM | POA: Diagnosis not present

## 2021-10-26 DIAGNOSIS — Z4800 Encounter for change or removal of nonsurgical wound dressing: Secondary | ICD-10-CM | POA: Diagnosis not present

## 2021-10-26 DIAGNOSIS — I482 Chronic atrial fibrillation, unspecified: Secondary | ICD-10-CM | POA: Diagnosis not present

## 2021-10-26 DIAGNOSIS — I2510 Atherosclerotic heart disease of native coronary artery without angina pectoris: Secondary | ICD-10-CM | POA: Diagnosis not present

## 2021-10-26 DIAGNOSIS — I110 Hypertensive heart disease with heart failure: Secondary | ICD-10-CM | POA: Diagnosis not present

## 2021-10-27 DIAGNOSIS — D509 Iron deficiency anemia, unspecified: Secondary | ICD-10-CM | POA: Diagnosis not present

## 2021-10-27 DIAGNOSIS — E113213 Type 2 diabetes mellitus with mild nonproliferative diabetic retinopathy with macular edema, bilateral: Secondary | ICD-10-CM | POA: Diagnosis not present

## 2021-10-27 DIAGNOSIS — N186 End stage renal disease: Secondary | ICD-10-CM | POA: Diagnosis not present

## 2021-10-27 DIAGNOSIS — N2581 Secondary hyperparathyroidism of renal origin: Secondary | ICD-10-CM | POA: Diagnosis not present

## 2021-10-27 DIAGNOSIS — Z961 Presence of intraocular lens: Secondary | ICD-10-CM | POA: Diagnosis not present

## 2021-10-27 DIAGNOSIS — Z992 Dependence on renal dialysis: Secondary | ICD-10-CM | POA: Diagnosis not present

## 2021-10-27 DIAGNOSIS — Z8616 Personal history of COVID-19: Secondary | ICD-10-CM | POA: Diagnosis not present

## 2021-10-27 DIAGNOSIS — Z794 Long term (current) use of insulin: Secondary | ICD-10-CM | POA: Diagnosis not present

## 2021-10-27 DIAGNOSIS — E1122 Type 2 diabetes mellitus with diabetic chronic kidney disease: Secondary | ICD-10-CM | POA: Diagnosis not present

## 2021-10-28 ENCOUNTER — Telehealth: Payer: Self-pay

## 2021-10-28 DIAGNOSIS — A0471 Enterocolitis due to Clostridium difficile, recurrent: Secondary | ICD-10-CM | POA: Diagnosis not present

## 2021-10-28 DIAGNOSIS — I110 Hypertensive heart disease with heart failure: Secondary | ICD-10-CM | POA: Diagnosis not present

## 2021-10-28 DIAGNOSIS — I482 Chronic atrial fibrillation, unspecified: Secondary | ICD-10-CM | POA: Diagnosis not present

## 2021-10-28 DIAGNOSIS — Z4800 Encounter for change or removal of nonsurgical wound dressing: Secondary | ICD-10-CM | POA: Diagnosis not present

## 2021-10-28 DIAGNOSIS — I2510 Atherosclerotic heart disease of native coronary artery without angina pectoris: Secondary | ICD-10-CM | POA: Diagnosis not present

## 2021-10-28 DIAGNOSIS — L89623 Pressure ulcer of left heel, stage 3: Secondary | ICD-10-CM | POA: Diagnosis not present

## 2021-10-28 NOTE — Telephone Encounter (Signed)
Received fax from patient's insurance stating The Onetouch Ultra Blue test strips does not meet medical necessity due to patient having CGM. Patient and patients wife advised of message. Patient's wife stated she is okay with that because she has some test strips that the insurance company approved.

## 2021-10-29 DIAGNOSIS — D509 Iron deficiency anemia, unspecified: Secondary | ICD-10-CM | POA: Diagnosis not present

## 2021-10-29 DIAGNOSIS — N2581 Secondary hyperparathyroidism of renal origin: Secondary | ICD-10-CM | POA: Diagnosis not present

## 2021-10-29 DIAGNOSIS — N186 End stage renal disease: Secondary | ICD-10-CM | POA: Diagnosis not present

## 2021-10-29 DIAGNOSIS — Z992 Dependence on renal dialysis: Secondary | ICD-10-CM | POA: Diagnosis not present

## 2021-10-29 DIAGNOSIS — E1122 Type 2 diabetes mellitus with diabetic chronic kidney disease: Secondary | ICD-10-CM | POA: Diagnosis not present

## 2021-10-30 DIAGNOSIS — D631 Anemia in chronic kidney disease: Secondary | ICD-10-CM | POA: Diagnosis not present

## 2021-10-30 DIAGNOSIS — M6281 Muscle weakness (generalized): Secondary | ICD-10-CM | POA: Diagnosis not present

## 2021-10-30 DIAGNOSIS — I509 Heart failure, unspecified: Secondary | ICD-10-CM | POA: Diagnosis not present

## 2021-10-30 DIAGNOSIS — Z4800 Encounter for change or removal of nonsurgical wound dressing: Secondary | ICD-10-CM | POA: Diagnosis not present

## 2021-10-30 DIAGNOSIS — I482 Chronic atrial fibrillation, unspecified: Secondary | ICD-10-CM | POA: Diagnosis not present

## 2021-10-30 DIAGNOSIS — N186 End stage renal disease: Secondary | ICD-10-CM | POA: Diagnosis not present

## 2021-10-30 DIAGNOSIS — I2510 Atherosclerotic heart disease of native coronary artery without angina pectoris: Secondary | ICD-10-CM | POA: Diagnosis not present

## 2021-10-30 DIAGNOSIS — I110 Hypertensive heart disease with heart failure: Secondary | ICD-10-CM | POA: Diagnosis not present

## 2021-10-30 DIAGNOSIS — H52209 Unspecified astigmatism, unspecified eye: Secondary | ICD-10-CM | POA: Diagnosis not present

## 2021-10-30 DIAGNOSIS — K529 Noninfective gastroenteritis and colitis, unspecified: Secondary | ICD-10-CM | POA: Diagnosis not present

## 2021-10-30 DIAGNOSIS — M545 Low back pain, unspecified: Secondary | ICD-10-CM | POA: Diagnosis not present

## 2021-10-30 DIAGNOSIS — E1129 Type 2 diabetes mellitus with other diabetic kidney complication: Secondary | ICD-10-CM | POA: Diagnosis not present

## 2021-10-30 DIAGNOSIS — Z992 Dependence on renal dialysis: Secondary | ICD-10-CM | POA: Diagnosis not present

## 2021-10-30 DIAGNOSIS — A0471 Enterocolitis due to Clostridium difficile, recurrent: Secondary | ICD-10-CM | POA: Diagnosis not present

## 2021-10-30 DIAGNOSIS — G43019 Migraine without aura, intractable, without status migrainosus: Secondary | ICD-10-CM | POA: Diagnosis not present

## 2021-10-30 DIAGNOSIS — R2681 Unsteadiness on feet: Secondary | ICD-10-CM | POA: Diagnosis not present

## 2021-10-30 DIAGNOSIS — L89623 Pressure ulcer of left heel, stage 3: Secondary | ICD-10-CM | POA: Diagnosis not present

## 2021-10-30 DIAGNOSIS — E1142 Type 2 diabetes mellitus with diabetic polyneuropathy: Secondary | ICD-10-CM | POA: Diagnosis not present

## 2021-11-01 DIAGNOSIS — N2581 Secondary hyperparathyroidism of renal origin: Secondary | ICD-10-CM | POA: Diagnosis not present

## 2021-11-01 DIAGNOSIS — N186 End stage renal disease: Secondary | ICD-10-CM | POA: Diagnosis not present

## 2021-11-01 DIAGNOSIS — Z992 Dependence on renal dialysis: Secondary | ICD-10-CM | POA: Diagnosis not present

## 2021-11-01 DIAGNOSIS — D509 Iron deficiency anemia, unspecified: Secondary | ICD-10-CM | POA: Diagnosis not present

## 2021-11-01 DIAGNOSIS — E1122 Type 2 diabetes mellitus with diabetic chronic kidney disease: Secondary | ICD-10-CM | POA: Diagnosis not present

## 2021-11-02 DIAGNOSIS — I110 Hypertensive heart disease with heart failure: Secondary | ICD-10-CM | POA: Diagnosis not present

## 2021-11-02 DIAGNOSIS — I482 Chronic atrial fibrillation, unspecified: Secondary | ICD-10-CM | POA: Diagnosis not present

## 2021-11-02 DIAGNOSIS — L89623 Pressure ulcer of left heel, stage 3: Secondary | ICD-10-CM | POA: Diagnosis not present

## 2021-11-02 DIAGNOSIS — Z4800 Encounter for change or removal of nonsurgical wound dressing: Secondary | ICD-10-CM | POA: Diagnosis not present

## 2021-11-02 DIAGNOSIS — A0471 Enterocolitis due to Clostridium difficile, recurrent: Secondary | ICD-10-CM | POA: Diagnosis not present

## 2021-11-02 DIAGNOSIS — I2510 Atherosclerotic heart disease of native coronary artery without angina pectoris: Secondary | ICD-10-CM | POA: Diagnosis not present

## 2021-11-03 DIAGNOSIS — Z794 Long term (current) use of insulin: Secondary | ICD-10-CM | POA: Diagnosis not present

## 2021-11-03 DIAGNOSIS — E1122 Type 2 diabetes mellitus with diabetic chronic kidney disease: Secondary | ICD-10-CM | POA: Diagnosis not present

## 2021-11-03 DIAGNOSIS — Z961 Presence of intraocular lens: Secondary | ICD-10-CM | POA: Diagnosis not present

## 2021-11-03 DIAGNOSIS — Z992 Dependence on renal dialysis: Secondary | ICD-10-CM | POA: Diagnosis not present

## 2021-11-03 DIAGNOSIS — N186 End stage renal disease: Secondary | ICD-10-CM | POA: Diagnosis not present

## 2021-11-03 DIAGNOSIS — N2581 Secondary hyperparathyroidism of renal origin: Secondary | ICD-10-CM | POA: Diagnosis not present

## 2021-11-03 DIAGNOSIS — E113213 Type 2 diabetes mellitus with mild nonproliferative diabetic retinopathy with macular edema, bilateral: Secondary | ICD-10-CM | POA: Diagnosis not present

## 2021-11-03 DIAGNOSIS — D509 Iron deficiency anemia, unspecified: Secondary | ICD-10-CM | POA: Diagnosis not present

## 2021-11-04 DIAGNOSIS — Z4800 Encounter for change or removal of nonsurgical wound dressing: Secondary | ICD-10-CM | POA: Diagnosis not present

## 2021-11-04 DIAGNOSIS — I110 Hypertensive heart disease with heart failure: Secondary | ICD-10-CM | POA: Diagnosis not present

## 2021-11-04 DIAGNOSIS — I2510 Atherosclerotic heart disease of native coronary artery without angina pectoris: Secondary | ICD-10-CM | POA: Diagnosis not present

## 2021-11-04 DIAGNOSIS — I482 Chronic atrial fibrillation, unspecified: Secondary | ICD-10-CM | POA: Diagnosis not present

## 2021-11-04 DIAGNOSIS — A0471 Enterocolitis due to Clostridium difficile, recurrent: Secondary | ICD-10-CM | POA: Diagnosis not present

## 2021-11-04 DIAGNOSIS — L89623 Pressure ulcer of left heel, stage 3: Secondary | ICD-10-CM | POA: Diagnosis not present

## 2021-11-05 DIAGNOSIS — Z992 Dependence on renal dialysis: Secondary | ICD-10-CM | POA: Diagnosis not present

## 2021-11-05 DIAGNOSIS — E1122 Type 2 diabetes mellitus with diabetic chronic kidney disease: Secondary | ICD-10-CM | POA: Diagnosis not present

## 2021-11-05 DIAGNOSIS — D509 Iron deficiency anemia, unspecified: Secondary | ICD-10-CM | POA: Diagnosis not present

## 2021-11-05 DIAGNOSIS — N2581 Secondary hyperparathyroidism of renal origin: Secondary | ICD-10-CM | POA: Diagnosis not present

## 2021-11-05 DIAGNOSIS — N186 End stage renal disease: Secondary | ICD-10-CM | POA: Diagnosis not present

## 2021-11-07 DIAGNOSIS — Z4800 Encounter for change or removal of nonsurgical wound dressing: Secondary | ICD-10-CM | POA: Diagnosis not present

## 2021-11-07 DIAGNOSIS — L89623 Pressure ulcer of left heel, stage 3: Secondary | ICD-10-CM | POA: Diagnosis not present

## 2021-11-07 DIAGNOSIS — A0471 Enterocolitis due to Clostridium difficile, recurrent: Secondary | ICD-10-CM | POA: Diagnosis not present

## 2021-11-07 DIAGNOSIS — I110 Hypertensive heart disease with heart failure: Secondary | ICD-10-CM | POA: Diagnosis not present

## 2021-11-07 DIAGNOSIS — I2510 Atherosclerotic heart disease of native coronary artery without angina pectoris: Secondary | ICD-10-CM | POA: Diagnosis not present

## 2021-11-07 DIAGNOSIS — I482 Chronic atrial fibrillation, unspecified: Secondary | ICD-10-CM | POA: Diagnosis not present

## 2021-11-08 DIAGNOSIS — N186 End stage renal disease: Secondary | ICD-10-CM | POA: Diagnosis not present

## 2021-11-08 DIAGNOSIS — N2581 Secondary hyperparathyroidism of renal origin: Secondary | ICD-10-CM | POA: Diagnosis not present

## 2021-11-08 DIAGNOSIS — E1122 Type 2 diabetes mellitus with diabetic chronic kidney disease: Secondary | ICD-10-CM | POA: Diagnosis not present

## 2021-11-08 DIAGNOSIS — Z992 Dependence on renal dialysis: Secondary | ICD-10-CM | POA: Diagnosis not present

## 2021-11-08 DIAGNOSIS — D509 Iron deficiency anemia, unspecified: Secondary | ICD-10-CM | POA: Diagnosis not present

## 2021-11-09 DIAGNOSIS — A0471 Enterocolitis due to Clostridium difficile, recurrent: Secondary | ICD-10-CM | POA: Diagnosis not present

## 2021-11-09 DIAGNOSIS — Z4800 Encounter for change or removal of nonsurgical wound dressing: Secondary | ICD-10-CM | POA: Diagnosis not present

## 2021-11-09 DIAGNOSIS — I110 Hypertensive heart disease with heart failure: Secondary | ICD-10-CM | POA: Diagnosis not present

## 2021-11-09 DIAGNOSIS — L89623 Pressure ulcer of left heel, stage 3: Secondary | ICD-10-CM | POA: Diagnosis not present

## 2021-11-09 DIAGNOSIS — I482 Chronic atrial fibrillation, unspecified: Secondary | ICD-10-CM | POA: Diagnosis not present

## 2021-11-09 DIAGNOSIS — I2510 Atherosclerotic heart disease of native coronary artery without angina pectoris: Secondary | ICD-10-CM | POA: Diagnosis not present

## 2021-11-10 DIAGNOSIS — N2581 Secondary hyperparathyroidism of renal origin: Secondary | ICD-10-CM | POA: Diagnosis not present

## 2021-11-10 DIAGNOSIS — E1122 Type 2 diabetes mellitus with diabetic chronic kidney disease: Secondary | ICD-10-CM | POA: Diagnosis not present

## 2021-11-10 DIAGNOSIS — Z992 Dependence on renal dialysis: Secondary | ICD-10-CM | POA: Diagnosis not present

## 2021-11-10 DIAGNOSIS — N186 End stage renal disease: Secondary | ICD-10-CM | POA: Diagnosis not present

## 2021-11-10 DIAGNOSIS — D509 Iron deficiency anemia, unspecified: Secondary | ICD-10-CM | POA: Diagnosis not present

## 2021-11-12 DIAGNOSIS — D509 Iron deficiency anemia, unspecified: Secondary | ICD-10-CM | POA: Diagnosis not present

## 2021-11-12 DIAGNOSIS — N2581 Secondary hyperparathyroidism of renal origin: Secondary | ICD-10-CM | POA: Diagnosis not present

## 2021-11-12 DIAGNOSIS — N186 End stage renal disease: Secondary | ICD-10-CM | POA: Diagnosis not present

## 2021-11-12 DIAGNOSIS — E1122 Type 2 diabetes mellitus with diabetic chronic kidney disease: Secondary | ICD-10-CM | POA: Diagnosis not present

## 2021-11-12 DIAGNOSIS — Z992 Dependence on renal dialysis: Secondary | ICD-10-CM | POA: Diagnosis not present

## 2021-11-15 DIAGNOSIS — Z992 Dependence on renal dialysis: Secondary | ICD-10-CM | POA: Diagnosis not present

## 2021-11-15 DIAGNOSIS — N186 End stage renal disease: Secondary | ICD-10-CM | POA: Diagnosis not present

## 2021-11-15 DIAGNOSIS — D509 Iron deficiency anemia, unspecified: Secondary | ICD-10-CM | POA: Diagnosis not present

## 2021-11-15 DIAGNOSIS — E1122 Type 2 diabetes mellitus with diabetic chronic kidney disease: Secondary | ICD-10-CM | POA: Diagnosis not present

## 2021-11-15 DIAGNOSIS — N2581 Secondary hyperparathyroidism of renal origin: Secondary | ICD-10-CM | POA: Diagnosis not present

## 2021-11-15 NOTE — Progress Notes (Signed)
HPI: FU CAD, diastolic CHF and PAF. 9/47 ABIs were normal. Carotid dopplers 4/18 showed less than 50% bilaterally. Monitor 4/19 showed recurrent atrial fibrillation. Cardiac catheterization January 2020 showed 60% left main, 50% circumflex, occluded LAD, 50% first diagonal, 60% mid and distal right coronary artery and 60% posterior lateral branch. FFR of left main 0.95.  Pulmonary capillary wedge pressure 24 with PA pressure 68/23.  Medical therapy recommended. Patient is now on dialysis. Echocardiogram June 2020 performed at [facility] showed normal LV function, moderate left ventricular hypertrophy, moderate left atrial enlargement and mild mitral regurgitation. Right heart catheterization [facility] July 2020 showed right atrial pressure of 11, pulmonary artery pressure of 58/20, pulmonary capillary wedge pressure of 24. Patient turned down for transplant and now on dialysis.  Since last seen he denies dyspnea, chest pain, palpitations or syncope.  Current Outpatient Medications  Medication Sig Dispense Refill   allopurinol (ZYLOPRIM) 100 MG tablet Take 1 tablet (100 mg total) by mouth daily. 90 tablet 1   AMBULATORY NON FORMULARY MEDICATION Continuous positive airway pressure (CPAP) device: Auto titrate minimum 4 cm H20 to maximum of 20 cm H2O with pressure. Please provide all supplemental supplies as needed. Fax to: [phone] 1 Units 1   AMBULATORY NON FORMULARY MEDICATION CPAP supplies - Disposable filters (white), Replaceable pillow or cushions, Mask and Tubing as needed. 1 each prn   B-D INS SYR ULTRAFINE 1CC/31G 31G X 5/16" 1 ML MISC      blood glucose meter kit and supplies Use to check blood sugars daily E11.65     Cholecalciferol (VITAMIN D3) 50 MCG (2000 UT) TABS Take 2,000 Units by mouth daily.      Coenzyme Q10 (COQ10) 200 MG CAPS Take 200 mg by mouth daily.     Continuous Blood Gluc Receiver (FREESTYLE LIBRE 14 DAY READER) DEVI Dx DM E11.22 Check blood sugar 4 times daily. 1  each prn   Continuous Blood Gluc Sensor (FREESTYLE LIBRE 14 DAY SENSOR) MISC Dx DM E11.22 Check blood sugar 4 times daily. 1 each prn   ELIQUIS 5 MG TABS tablet Take 1 tablet (5 mg total) by mouth 2 (two) times daily. 180 tablet 3   furosemide (LASIX) 80 MG tablet Take 2 tablets (160 mg total) by mouth 2 (two) times daily. Morning & after lunch 360 tablet 1   isosorbide dinitrate (ISORDIL) 20 MG tablet Take 1 tablet (20 mg total) by mouth 2 (two) times daily. 180 tablet 3   nitroGLYCERIN (NITROSTAT) 0.4 MG SL tablet Place 1 tablet (0.4 mg total) under the tongue every 5 (five) minutes as needed for chest pain. 25 tablet 11   NOVOLIN N 100 UNIT/ML injection Inject 20-30 Units into the skin See admin instructions. Inject 30 units subcutaneously in the morning & inject 20-30 units subcutaneously at bedtime (depending on blood sugar)     NOVOLIN R 100 UNIT/ML injection Inject 8-20 Units into the skin 3 (three) times daily with meals. Sliding Scale  5   ONETOUCH ULTRA test strip USE 1 STRIP TO CHECK GLUCOSE THREE TIMES DAILY 300 each 0   Probiotic Product (PROBIOTIC DAILY PO) Take 420 mg by mouth daily.     rosuvastatin (CRESTOR) 40 MG tablet Take 1 tablet by mouth once daily 90 tablet 3   Vitamin Mixture (VITAMIN E COMPLETE PO) Take 1 capsule by mouth daily. Vitamin E complex     ZINC SULFATE PO Take 1 tablet by mouth daily.  fluconazole (DIFLUCAN) 200 MG tablet Take 200 mg by mouth. Once a week. (Patient not taking: Reported on 11/28/2021)     gabapentin (NEURONTIN) 300 MG capsule Take 1 capsule by mouth once daily (Patient not taking: Reported on 11/28/2021) 90 capsule 0   hydrALAZINE (APRESOLINE) 100 MG tablet TAKE 1 TABLET BY MOUTH THREE TIMES DAILY (Patient not taking: Reported on 11/28/2021) 270 tablet 2   lidocaine-prilocaine (EMLA) cream SMARTSIG:Sparingly Topical (Patient not taking: Reported on 11/28/2021)     Methoxy PEG-Epoetin Beta (MIRCERA IJ) Mircera (Patient not taking: Reported on 11/28/2021)      mupirocin ointment (BACTROBAN) 2 % APPLY TOPICALLY TWICE DAILY. APPLY TO INSIDE OF EACH NARES DAILY FOR 10 DAYS THEN TWICE A WEEK FOR MAINTENANCE. (Patient not taking: Reported on 11/28/2021) 22 g 0   omega-3 acid ethyl esters (LOVAZA) 1 g capsule Take 2 g by mouth daily.  (Patient not taking: Reported on 11/28/2021)     triamcinolone ointment (KENALOG) 0.5 % Apply 1 application topically 2 (two) times daily. To affected area, avoid eyes and face (Patient not taking: Reported on 11/28/2021) 30 g 3   Current Facility-Administered Medications  Medication Dose Route Frequency Provider Last Rate Last Admin   0.9 %  sodium chloride infusion  500 mL Intravenous Once Gatha Mayer, MD         Past Medical History:  Diagnosis Date   Arthritis    Brittle bones    per pt, has soft bones in right foot/wears boot cast!   CAD (coronary artery disease)    Cancer ([place])    skin cancer on arm   Cataract    Bil/ surg scheduled for right eye 01/18/17/ left eye 02/08/17   Charcot ankle, right 2019   CHF (congestive heart failure) ([place]) 2015   Diabetes mellitus    Type 2   Heart failure, diastolic ([place])    History of kidney stones    Hyperlipidemia    Hypertension    Macular degeneration disease    Macular edema 2014   OSA on CPAP    Paroxysmal atrial fibrillation ([place])    Personal history of colonic polyps - adenomas 01/28/2014   Renal insufficiency    [facility]   Shortness of breath dyspnea    Syncope and collapse     Past Surgical History:  Procedure Laterality Date   AV FISTULA PLACEMENT Left 09/01/2020   Procedure: LEFT ARM ARTERIOVENOUS (AV) FISTULA;  Surgeon: Waynetta Sandy, MD;  Location: [facility];  Service: Vascular;  Laterality: Left;   [place] Left 10/27/2020   Procedure: LEFT SECOND STAGE BASILIC VEIN FISTULA TRANSPOSITION;  Surgeon: Waynetta Sandy, MD;  Location: [facility];  Service: Vascular;  Laterality: Left;   CARPAL TUNNEL RELEASE      left hand   COLONOSCOPY     INTRAVASCULAR PRESSURE WIRE/FFR STUDY N/A 12/18/2018   Procedure: INTRAVASCULAR PRESSURE WIRE/FFR STUDY;  Surgeon: Wellington Hampshire, MD;  Location: [facility];  Service: Cardiovascular;  Laterality: N/A;   IR FLUORO GUIDE CV LINE RIGHT  12/16/2018   IR US GUIDE VASC ACCESS RIGHT  12/16/2018   KNEE ARTHROSCOPY Right 09/13/2016   [facility], Dr. Dorna Leitz   PILONIDAL CYST EXCISION     RIGHT/LEFT HEART CATH AND CORONARY ANGIOGRAPHY N/A 12/18/2018   Procedure: RIGHT/LEFT HEART CATH AND CORONARY ANGIOGRAPHY;  Surgeon: Wellington Hampshire, MD;  Location: [facility];  Service: Cardiovascular;  Laterality: N/A;    Social History  Socioeconomic History   Marital status: Married    Spouse name: [name]   Number of children: 5   Years of education: 12   Highest education level: 12th grade  Occupational History   Occupation: Warehouse    Comment: retired  Tobacco Use   Smoking status: Former    Packs/day: 0.50    Years: 20.00    Pack years: 10.00    Types: Cigarettes    Quit date: 03/06/1977    Years since quitting: 44.7   Smokeless tobacco: Never  Vaping Use   Vaping Use: Never used  Substance and Sexual Activity   Alcohol use: No   Drug use: No   Sexual activity: Not Currently  Other Topics Concern   Not on file  Social History Narrative   Married wife has metastatic breast cancer   Retired Proofreader work   5 children   Former smoker no alcohol tobacco or drug use   Was very active with his church group is a coffee drinker as well   Social Determinants of Sales executive: Low Risk    Difficulty of Paying Living Expenses: Not hard at all  Food Insecurity: No Food Insecurity   Worried About Charity fundraiser in the Last Year: Never true   Arboriculturist in the Last Year: Never true  Transportation Needs: No Transportation Needs   Lack of Transportation (Medical): No   Lack of Transportation  (Non-Medical): No  Physical Activity: Inactive   Days of Exercise per Week: 0 days   Minutes of Exercise per Session: 0 min  Stress: No Stress Concern Present   Feeling of Stress : Not at all  Social Connections: Moderately Integrated   Frequency of Communication with Friends and Family: Once a week   Frequency of Social Gatherings with Friends and Family: More than three times a week   Attends Religious Services: More than 4 times per year   Active Member of [employer] or Organizations: No   Attends Music therapist: Never   Marital Status: Married  Human resources officer Violence: Not At Risk   Fear of Current or Ex-Partner: No   Emotionally Abused: No   Physically Abused: No   Sexually Abused: No    Family History  Problem Relation Age of Onset   Diabetes Mother    Kidney disease Mother    Diabetes Father    Coronary artery disease Father    Diabetes Brother    Diabetes Sister    Prostate cancer Maternal Uncle    Diabetes Maternal Grandmother    Cancer Paternal Grandmother        unknown   Heart attack Paternal Grandfather    Colon cancer Neg Hx     ROS: no fevers or chills, productive cough, hemoptysis, dysphasia, odynophagia, melena, hematochezia, dysuria, hematuria, rash, seizure activity, orthopnea, PND, pedal edema, claudication. Remaining systems are negative.  Physical Exam: Well-developed well-nourished in no acute distress.  Skin is warm and dry.  HEENT is normal.  Neck is supple.  Chest is clear to auscultation with normal expansion.  Cardiovascular exam is irregular Abdominal exam nontender or distended. No masses palpated. Extremities show no edema. neuro grossly intact  ECG-atrial fibrillation at a rate of 74, normal axis, poor R wave progression, nonspecific ST changes.  Personally reviewed  A/P  1 coronary artery disease-patient denies chest pain.  Continue Crestor.  He is not on aspirin given need for chronic anticoagulation.  2 permanent  atrial fibrillation-patient remains in atrial fibrillation.  Plan will be rate control and anticoagulation.  Continue beta-blocker.  Continue apixaban.  3 hypertension-blood pressure controlled.  Continue present medications and follow.  4 hyperlipidemia-continue Crestor.  Check lipids, liver and CK.  5 chronic diastolic congestive heart failure-he appears to be euvolemic and his volume is now managed with dialysis.  6 end-stage renal disease-dialysis per nephrology.  [name], MD

## 2021-11-16 DIAGNOSIS — A0471 Enterocolitis due to Clostridium difficile, recurrent: Secondary | ICD-10-CM | POA: Diagnosis not present

## 2021-11-16 DIAGNOSIS — Z4800 Encounter for change or removal of nonsurgical wound dressing: Secondary | ICD-10-CM | POA: Diagnosis not present

## 2021-11-16 DIAGNOSIS — I110 Hypertensive heart disease with heart failure: Secondary | ICD-10-CM | POA: Diagnosis not present

## 2021-11-16 DIAGNOSIS — I2510 Atherosclerotic heart disease of native coronary artery without angina pectoris: Secondary | ICD-10-CM | POA: Diagnosis not present

## 2021-11-16 DIAGNOSIS — I482 Chronic atrial fibrillation, unspecified: Secondary | ICD-10-CM | POA: Diagnosis not present

## 2021-11-16 DIAGNOSIS — L89623 Pressure ulcer of left heel, stage 3: Secondary | ICD-10-CM | POA: Diagnosis not present

## 2021-11-17 DIAGNOSIS — I2510 Atherosclerotic heart disease of native coronary artery without angina pectoris: Secondary | ICD-10-CM | POA: Diagnosis not present

## 2021-11-17 DIAGNOSIS — I482 Chronic atrial fibrillation, unspecified: Secondary | ICD-10-CM | POA: Diagnosis not present

## 2021-11-17 DIAGNOSIS — A0471 Enterocolitis due to Clostridium difficile, recurrent: Secondary | ICD-10-CM | POA: Diagnosis not present

## 2021-11-17 DIAGNOSIS — L89623 Pressure ulcer of left heel, stage 3: Secondary | ICD-10-CM | POA: Diagnosis not present

## 2021-11-17 DIAGNOSIS — Z4800 Encounter for change or removal of nonsurgical wound dressing: Secondary | ICD-10-CM | POA: Diagnosis not present

## 2021-11-17 DIAGNOSIS — N2581 Secondary hyperparathyroidism of renal origin: Secondary | ICD-10-CM | POA: Diagnosis not present

## 2021-11-17 DIAGNOSIS — N186 End stage renal disease: Secondary | ICD-10-CM | POA: Diagnosis not present

## 2021-11-17 DIAGNOSIS — D509 Iron deficiency anemia, unspecified: Secondary | ICD-10-CM | POA: Diagnosis not present

## 2021-11-17 DIAGNOSIS — Z992 Dependence on renal dialysis: Secondary | ICD-10-CM | POA: Diagnosis not present

## 2021-11-17 DIAGNOSIS — I110 Hypertensive heart disease with heart failure: Secondary | ICD-10-CM | POA: Diagnosis not present

## 2021-11-17 DIAGNOSIS — E1122 Type 2 diabetes mellitus with diabetic chronic kidney disease: Secondary | ICD-10-CM | POA: Diagnosis not present

## 2021-11-19 DIAGNOSIS — N2581 Secondary hyperparathyroidism of renal origin: Secondary | ICD-10-CM | POA: Diagnosis not present

## 2021-11-19 DIAGNOSIS — Z992 Dependence on renal dialysis: Secondary | ICD-10-CM | POA: Diagnosis not present

## 2021-11-19 DIAGNOSIS — D509 Iron deficiency anemia, unspecified: Secondary | ICD-10-CM | POA: Diagnosis not present

## 2021-11-19 DIAGNOSIS — N186 End stage renal disease: Secondary | ICD-10-CM | POA: Diagnosis not present

## 2021-11-19 DIAGNOSIS — E1122 Type 2 diabetes mellitus with diabetic chronic kidney disease: Secondary | ICD-10-CM | POA: Diagnosis not present

## 2021-11-20 DIAGNOSIS — Z992 Dependence on renal dialysis: Secondary | ICD-10-CM | POA: Diagnosis not present

## 2021-11-20 DIAGNOSIS — N186 End stage renal disease: Secondary | ICD-10-CM | POA: Diagnosis not present

## 2021-11-20 DIAGNOSIS — E1122 Type 2 diabetes mellitus with diabetic chronic kidney disease: Secondary | ICD-10-CM | POA: Diagnosis not present

## 2021-11-22 DIAGNOSIS — N2581 Secondary hyperparathyroidism of renal origin: Secondary | ICD-10-CM | POA: Diagnosis not present

## 2021-11-22 DIAGNOSIS — E1122 Type 2 diabetes mellitus with diabetic chronic kidney disease: Secondary | ICD-10-CM | POA: Diagnosis not present

## 2021-11-22 DIAGNOSIS — N186 End stage renal disease: Secondary | ICD-10-CM | POA: Diagnosis not present

## 2021-11-22 DIAGNOSIS — D509 Iron deficiency anemia, unspecified: Secondary | ICD-10-CM | POA: Diagnosis not present

## 2021-11-22 DIAGNOSIS — Z992 Dependence on renal dialysis: Secondary | ICD-10-CM | POA: Diagnosis not present

## 2021-11-24 DIAGNOSIS — N2581 Secondary hyperparathyroidism of renal origin: Secondary | ICD-10-CM | POA: Diagnosis not present

## 2021-11-24 DIAGNOSIS — D509 Iron deficiency anemia, unspecified: Secondary | ICD-10-CM | POA: Diagnosis not present

## 2021-11-24 DIAGNOSIS — Z992 Dependence on renal dialysis: Secondary | ICD-10-CM | POA: Diagnosis not present

## 2021-11-24 DIAGNOSIS — E1122 Type 2 diabetes mellitus with diabetic chronic kidney disease: Secondary | ICD-10-CM | POA: Diagnosis not present

## 2021-11-24 DIAGNOSIS — N186 End stage renal disease: Secondary | ICD-10-CM | POA: Diagnosis not present

## 2021-11-25 DIAGNOSIS — I2510 Atherosclerotic heart disease of native coronary artery without angina pectoris: Secondary | ICD-10-CM | POA: Diagnosis not present

## 2021-11-25 DIAGNOSIS — I482 Chronic atrial fibrillation, unspecified: Secondary | ICD-10-CM | POA: Diagnosis not present

## 2021-11-25 DIAGNOSIS — A0471 Enterocolitis due to Clostridium difficile, recurrent: Secondary | ICD-10-CM | POA: Diagnosis not present

## 2021-11-25 DIAGNOSIS — Z4800 Encounter for change or removal of nonsurgical wound dressing: Secondary | ICD-10-CM | POA: Diagnosis not present

## 2021-11-25 DIAGNOSIS — L89623 Pressure ulcer of left heel, stage 3: Secondary | ICD-10-CM | POA: Diagnosis not present

## 2021-11-25 DIAGNOSIS — I110 Hypertensive heart disease with heart failure: Secondary | ICD-10-CM | POA: Diagnosis not present

## 2021-11-26 DIAGNOSIS — N186 End stage renal disease: Secondary | ICD-10-CM | POA: Diagnosis not present

## 2021-11-26 DIAGNOSIS — D509 Iron deficiency anemia, unspecified: Secondary | ICD-10-CM | POA: Diagnosis not present

## 2021-11-26 DIAGNOSIS — E1122 Type 2 diabetes mellitus with diabetic chronic kidney disease: Secondary | ICD-10-CM | POA: Diagnosis not present

## 2021-11-26 DIAGNOSIS — Z992 Dependence on renal dialysis: Secondary | ICD-10-CM | POA: Diagnosis not present

## 2021-11-26 DIAGNOSIS — N2581 Secondary hyperparathyroidism of renal origin: Secondary | ICD-10-CM | POA: Diagnosis not present

## 2021-11-28 ENCOUNTER — Encounter: Payer: Self-pay | Admitting: Cardiology

## 2021-11-28 ENCOUNTER — Ambulatory Visit (INDEPENDENT_AMBULATORY_CARE_PROVIDER_SITE_OTHER): Payer: Medicare Other | Admitting: Cardiology

## 2021-11-28 ENCOUNTER — Other Ambulatory Visit: Payer: Self-pay

## 2021-11-28 VITALS — BP 134/66 | HR 74 | Ht 67.0 in | Wt 220.0 lb

## 2021-11-28 DIAGNOSIS — I10 Essential (primary) hypertension: Secondary | ICD-10-CM | POA: Diagnosis not present

## 2021-11-28 DIAGNOSIS — E7800 Pure hypercholesterolemia, unspecified: Secondary | ICD-10-CM | POA: Diagnosis not present

## 2021-11-28 DIAGNOSIS — I4821 Permanent atrial fibrillation: Secondary | ICD-10-CM | POA: Diagnosis not present

## 2021-11-28 DIAGNOSIS — I2510 Atherosclerotic heart disease of native coronary artery without angina pectoris: Secondary | ICD-10-CM

## 2021-11-28 DIAGNOSIS — L249 Irritant contact dermatitis, unspecified cause: Secondary | ICD-10-CM | POA: Diagnosis not present

## 2021-11-28 DIAGNOSIS — B356 Tinea cruris: Secondary | ICD-10-CM | POA: Diagnosis not present

## 2021-11-28 NOTE — Patient Instructions (Signed)
°  Lab Work:  Your physician recommends that you return for lab WORK: FASTING  If you have labs (blood work) drawn today and your tests are completely normal, you will receive your results only by: [place] (if you have MyChart) OR A paper copy in the mail If you have any lab test that is abnormal or we need to change your treatment, we will call you to review the results.   Follow-Up: At [facility], you and your health needs are our priority.  As part of our continuing mission to provide you with exceptional heart care, we have created designated Provider Care Teams.  These Care Teams include your primary Cardiologist (physician) and Advanced Practice Providers (APPs -  Physician Assistants and Nurse Practitioners) who all work together to provide you with the care you need, when you need it.  We recommend signing up for the patient portal called "MyChart".  Sign up information is provided on this After Visit Summary.  MyChart is used to connect with patients for Virtual Visits (Telemedicine).  Patients are able to view lab/test results, encounter notes, upcoming appointments, etc.  Non-urgent messages can be sent to your provider as well.   To learn more about what you can do with MyChart, go to [email or web address].    Your next appointment:   6 month(s)  The format for your next appointment:   In Person  Provider:   [name], MD

## 2021-11-29 DIAGNOSIS — D509 Iron deficiency anemia, unspecified: Secondary | ICD-10-CM | POA: Diagnosis not present

## 2021-11-29 DIAGNOSIS — I2510 Atherosclerotic heart disease of native coronary artery without angina pectoris: Secondary | ICD-10-CM | POA: Diagnosis not present

## 2021-11-29 DIAGNOSIS — N186 End stage renal disease: Secondary | ICD-10-CM | POA: Diagnosis not present

## 2021-11-29 DIAGNOSIS — M6281 Muscle weakness (generalized): Secondary | ICD-10-CM | POA: Diagnosis not present

## 2021-11-29 DIAGNOSIS — N2581 Secondary hyperparathyroidism of renal origin: Secondary | ICD-10-CM | POA: Diagnosis not present

## 2021-11-29 DIAGNOSIS — E1122 Type 2 diabetes mellitus with diabetic chronic kidney disease: Secondary | ICD-10-CM | POA: Diagnosis not present

## 2021-11-29 DIAGNOSIS — I482 Chronic atrial fibrillation, unspecified: Secondary | ICD-10-CM | POA: Diagnosis not present

## 2021-11-29 DIAGNOSIS — I509 Heart failure, unspecified: Secondary | ICD-10-CM | POA: Diagnosis not present

## 2021-11-29 DIAGNOSIS — H52209 Unspecified astigmatism, unspecified eye: Secondary | ICD-10-CM | POA: Diagnosis not present

## 2021-11-29 DIAGNOSIS — R2681 Unsteadiness on feet: Secondary | ICD-10-CM | POA: Diagnosis not present

## 2021-11-29 DIAGNOSIS — E1129 Type 2 diabetes mellitus with other diabetic kidney complication: Secondary | ICD-10-CM | POA: Diagnosis not present

## 2021-11-29 DIAGNOSIS — D631 Anemia in chronic kidney disease: Secondary | ICD-10-CM | POA: Diagnosis not present

## 2021-11-29 DIAGNOSIS — Z992 Dependence on renal dialysis: Secondary | ICD-10-CM | POA: Diagnosis not present

## 2021-11-29 DIAGNOSIS — I110 Hypertensive heart disease with heart failure: Secondary | ICD-10-CM | POA: Diagnosis not present

## 2021-11-29 DIAGNOSIS — M545 Low back pain, unspecified: Secondary | ICD-10-CM | POA: Diagnosis not present

## 2021-11-29 DIAGNOSIS — E1142 Type 2 diabetes mellitus with diabetic polyneuropathy: Secondary | ICD-10-CM | POA: Diagnosis not present

## 2021-11-29 DIAGNOSIS — L89623 Pressure ulcer of left heel, stage 3: Secondary | ICD-10-CM | POA: Diagnosis not present

## 2021-11-29 DIAGNOSIS — Z7901 Long term (current) use of anticoagulants: Secondary | ICD-10-CM | POA: Diagnosis not present

## 2021-11-30 DIAGNOSIS — D631 Anemia in chronic kidney disease: Secondary | ICD-10-CM | POA: Diagnosis not present

## 2021-11-30 DIAGNOSIS — L89623 Pressure ulcer of left heel, stage 3: Secondary | ICD-10-CM | POA: Diagnosis not present

## 2021-11-30 DIAGNOSIS — E1129 Type 2 diabetes mellitus with other diabetic kidney complication: Secondary | ICD-10-CM | POA: Diagnosis not present

## 2021-11-30 DIAGNOSIS — N186 End stage renal disease: Secondary | ICD-10-CM | POA: Diagnosis not present

## 2021-11-30 DIAGNOSIS — Z992 Dependence on renal dialysis: Secondary | ICD-10-CM | POA: Diagnosis not present

## 2021-11-30 DIAGNOSIS — E1142 Type 2 diabetes mellitus with diabetic polyneuropathy: Secondary | ICD-10-CM | POA: Diagnosis not present

## 2021-12-01 ENCOUNTER — Other Ambulatory Visit: Payer: Self-pay | Admitting: Family Medicine

## 2021-12-01 DIAGNOSIS — Z992 Dependence on renal dialysis: Secondary | ICD-10-CM | POA: Diagnosis not present

## 2021-12-01 DIAGNOSIS — E1122 Type 2 diabetes mellitus with diabetic chronic kidney disease: Secondary | ICD-10-CM | POA: Diagnosis not present

## 2021-12-01 DIAGNOSIS — N2581 Secondary hyperparathyroidism of renal origin: Secondary | ICD-10-CM | POA: Diagnosis not present

## 2021-12-01 DIAGNOSIS — Z8616 Personal history of COVID-19: Secondary | ICD-10-CM | POA: Diagnosis not present

## 2021-12-01 DIAGNOSIS — Z794 Long term (current) use of insulin: Secondary | ICD-10-CM | POA: Diagnosis not present

## 2021-12-01 DIAGNOSIS — Z961 Presence of intraocular lens: Secondary | ICD-10-CM | POA: Diagnosis not present

## 2021-12-01 DIAGNOSIS — N186 End stage renal disease: Secondary | ICD-10-CM | POA: Diagnosis not present

## 2021-12-01 DIAGNOSIS — E113213 Type 2 diabetes mellitus with mild nonproliferative diabetic retinopathy with macular edema, bilateral: Secondary | ICD-10-CM | POA: Diagnosis not present

## 2021-12-01 DIAGNOSIS — D509 Iron deficiency anemia, unspecified: Secondary | ICD-10-CM | POA: Diagnosis not present

## 2021-12-01 NOTE — Telephone Encounter (Signed)
Last written 01/2021 #360 with 1 refills Okay to fill?

## 2021-12-02 DIAGNOSIS — D631 Anemia in chronic kidney disease: Secondary | ICD-10-CM | POA: Diagnosis not present

## 2021-12-02 DIAGNOSIS — N186 End stage renal disease: Secondary | ICD-10-CM | POA: Diagnosis not present

## 2021-12-02 DIAGNOSIS — E1129 Type 2 diabetes mellitus with other diabetic kidney complication: Secondary | ICD-10-CM | POA: Diagnosis not present

## 2021-12-02 DIAGNOSIS — Z992 Dependence on renal dialysis: Secondary | ICD-10-CM | POA: Diagnosis not present

## 2021-12-02 DIAGNOSIS — E1142 Type 2 diabetes mellitus with diabetic polyneuropathy: Secondary | ICD-10-CM | POA: Diagnosis not present

## 2021-12-02 DIAGNOSIS — L89623 Pressure ulcer of left heel, stage 3: Secondary | ICD-10-CM | POA: Diagnosis not present

## 2021-12-03 DIAGNOSIS — N2581 Secondary hyperparathyroidism of renal origin: Secondary | ICD-10-CM | POA: Diagnosis not present

## 2021-12-03 DIAGNOSIS — Z992 Dependence on renal dialysis: Secondary | ICD-10-CM | POA: Diagnosis not present

## 2021-12-03 DIAGNOSIS — D509 Iron deficiency anemia, unspecified: Secondary | ICD-10-CM | POA: Diagnosis not present

## 2021-12-03 DIAGNOSIS — E1122 Type 2 diabetes mellitus with diabetic chronic kidney disease: Secondary | ICD-10-CM | POA: Diagnosis not present

## 2021-12-03 DIAGNOSIS — N186 End stage renal disease: Secondary | ICD-10-CM | POA: Diagnosis not present

## 2021-12-05 DIAGNOSIS — L89623 Pressure ulcer of left heel, stage 3: Secondary | ICD-10-CM | POA: Diagnosis not present

## 2021-12-05 DIAGNOSIS — E1129 Type 2 diabetes mellitus with other diabetic kidney complication: Secondary | ICD-10-CM | POA: Diagnosis not present

## 2021-12-05 DIAGNOSIS — E7800 Pure hypercholesterolemia, unspecified: Secondary | ICD-10-CM | POA: Diagnosis not present

## 2021-12-05 DIAGNOSIS — D631 Anemia in chronic kidney disease: Secondary | ICD-10-CM | POA: Diagnosis not present

## 2021-12-05 DIAGNOSIS — N186 End stage renal disease: Secondary | ICD-10-CM | POA: Diagnosis not present

## 2021-12-05 DIAGNOSIS — E1142 Type 2 diabetes mellitus with diabetic polyneuropathy: Secondary | ICD-10-CM | POA: Diagnosis not present

## 2021-12-05 DIAGNOSIS — Z992 Dependence on renal dialysis: Secondary | ICD-10-CM | POA: Diagnosis not present

## 2021-12-05 LAB — HEPATIC FUNCTION PANEL
AG Ratio: 1.3 (calc) (ref 1.0–2.5)
ALT: 14 U/L (ref 9–46)
AST: 16 U/L (ref 10–35)
Albumin: 3.8 g/dL (ref 3.6–5.1)
Alkaline phosphatase (APISO): 132 U/L (ref 35–144)
Bilirubin, Direct: 0.3 mg/dL — ABNORMAL HIGH (ref 0.0–0.2)
Globulin: 3 g/dL (calc) (ref 1.9–3.7)
Indirect Bilirubin: 0.8 mg/dL (calc) (ref 0.2–1.2)
Total Bilirubin: 1.1 mg/dL (ref 0.2–1.2)
Total Protein: 6.8 g/dL (ref 6.1–8.1)

## 2021-12-05 LAB — CK: Total CK: 49 U/L (ref 44–196)

## 2021-12-05 LAB — LIPID PANEL
Cholesterol: 83 mg/dL (ref <200)
HDL: 37 mg/dL — ABNORMAL LOW (ref >=40)
LDL Cholesterol (Calc): 29 mg/dL (calc)
Non-HDL Cholesterol (Calc): 46 mg/dL (calc) (ref <130)
Total CHOL/HDL Ratio: 2.2 (calc) (ref <5.0)
Triglycerides: 90 mg/dL (ref <150)

## 2021-12-06 DIAGNOSIS — D509 Iron deficiency anemia, unspecified: Secondary | ICD-10-CM | POA: Diagnosis not present

## 2021-12-06 DIAGNOSIS — N2581 Secondary hyperparathyroidism of renal origin: Secondary | ICD-10-CM | POA: Diagnosis not present

## 2021-12-06 DIAGNOSIS — N186 End stage renal disease: Secondary | ICD-10-CM | POA: Diagnosis not present

## 2021-12-06 DIAGNOSIS — E1122 Type 2 diabetes mellitus with diabetic chronic kidney disease: Secondary | ICD-10-CM | POA: Diagnosis not present

## 2021-12-06 DIAGNOSIS — Z992 Dependence on renal dialysis: Secondary | ICD-10-CM | POA: Diagnosis not present

## 2021-12-06 DIAGNOSIS — L89623 Pressure ulcer of left heel, stage 3: Secondary | ICD-10-CM | POA: Diagnosis not present

## 2021-12-07 DIAGNOSIS — E1142 Type 2 diabetes mellitus with diabetic polyneuropathy: Secondary | ICD-10-CM | POA: Diagnosis not present

## 2021-12-07 DIAGNOSIS — Z992 Dependence on renal dialysis: Secondary | ICD-10-CM | POA: Diagnosis not present

## 2021-12-07 DIAGNOSIS — E1129 Type 2 diabetes mellitus with other diabetic kidney complication: Secondary | ICD-10-CM | POA: Diagnosis not present

## 2021-12-07 DIAGNOSIS — D631 Anemia in chronic kidney disease: Secondary | ICD-10-CM | POA: Diagnosis not present

## 2021-12-07 DIAGNOSIS — L89623 Pressure ulcer of left heel, stage 3: Secondary | ICD-10-CM | POA: Diagnosis not present

## 2021-12-07 DIAGNOSIS — N186 End stage renal disease: Secondary | ICD-10-CM | POA: Diagnosis not present

## 2021-12-08 ENCOUNTER — Encounter: Payer: Self-pay | Admitting: *Deleted

## 2021-12-08 DIAGNOSIS — D509 Iron deficiency anemia, unspecified: Secondary | ICD-10-CM | POA: Diagnosis not present

## 2021-12-08 DIAGNOSIS — N186 End stage renal disease: Secondary | ICD-10-CM | POA: Diagnosis not present

## 2021-12-08 DIAGNOSIS — E1122 Type 2 diabetes mellitus with diabetic chronic kidney disease: Secondary | ICD-10-CM | POA: Diagnosis not present

## 2021-12-08 DIAGNOSIS — N2581 Secondary hyperparathyroidism of renal origin: Secondary | ICD-10-CM | POA: Diagnosis not present

## 2021-12-08 DIAGNOSIS — Z992 Dependence on renal dialysis: Secondary | ICD-10-CM | POA: Diagnosis not present

## 2021-12-09 ENCOUNTER — Telehealth: Payer: Self-pay

## 2021-12-09 NOTE — Telephone Encounter (Signed)
Received call from [facility] physical therapist. Elfredia Nevins stated patient was scheduled for PT this week and patients wife would like to push it back to next week. Ninika calling for verbal order to see patient next week instead of this week. Elfredia Nevins can be reached at [phone]

## 2021-12-10 DIAGNOSIS — D509 Iron deficiency anemia, unspecified: Secondary | ICD-10-CM | POA: Diagnosis not present

## 2021-12-10 DIAGNOSIS — Z992 Dependence on renal dialysis: Secondary | ICD-10-CM | POA: Diagnosis not present

## 2021-12-10 DIAGNOSIS — N186 End stage renal disease: Secondary | ICD-10-CM | POA: Diagnosis not present

## 2021-12-10 DIAGNOSIS — N2581 Secondary hyperparathyroidism of renal origin: Secondary | ICD-10-CM | POA: Diagnosis not present

## 2021-12-10 DIAGNOSIS — E1122 Type 2 diabetes mellitus with diabetic chronic kidney disease: Secondary | ICD-10-CM | POA: Diagnosis not present

## 2021-12-12 NOTE — Telephone Encounter (Signed)
Home health advised.  ?

## 2021-12-13 DIAGNOSIS — N186 End stage renal disease: Secondary | ICD-10-CM | POA: Diagnosis not present

## 2021-12-13 DIAGNOSIS — Z992 Dependence on renal dialysis: Secondary | ICD-10-CM | POA: Diagnosis not present

## 2021-12-13 DIAGNOSIS — N2581 Secondary hyperparathyroidism of renal origin: Secondary | ICD-10-CM | POA: Diagnosis not present

## 2021-12-13 DIAGNOSIS — D509 Iron deficiency anemia, unspecified: Secondary | ICD-10-CM | POA: Diagnosis not present

## 2021-12-13 DIAGNOSIS — E1122 Type 2 diabetes mellitus with diabetic chronic kidney disease: Secondary | ICD-10-CM | POA: Diagnosis not present

## 2021-12-14 DIAGNOSIS — Z992 Dependence on renal dialysis: Secondary | ICD-10-CM | POA: Diagnosis not present

## 2021-12-14 DIAGNOSIS — E1129 Type 2 diabetes mellitus with other diabetic kidney complication: Secondary | ICD-10-CM | POA: Diagnosis not present

## 2021-12-14 DIAGNOSIS — L89623 Pressure ulcer of left heel, stage 3: Secondary | ICD-10-CM | POA: Diagnosis not present

## 2021-12-14 DIAGNOSIS — E1142 Type 2 diabetes mellitus with diabetic polyneuropathy: Secondary | ICD-10-CM | POA: Diagnosis not present

## 2021-12-14 DIAGNOSIS — D631 Anemia in chronic kidney disease: Secondary | ICD-10-CM | POA: Diagnosis not present

## 2021-12-14 DIAGNOSIS — N186 End stage renal disease: Secondary | ICD-10-CM | POA: Diagnosis not present

## 2021-12-15 ENCOUNTER — Telehealth: Payer: Self-pay | Admitting: Family Medicine

## 2021-12-15 DIAGNOSIS — Z992 Dependence on renal dialysis: Secondary | ICD-10-CM | POA: Diagnosis not present

## 2021-12-15 DIAGNOSIS — E1122 Type 2 diabetes mellitus with diabetic chronic kidney disease: Secondary | ICD-10-CM | POA: Diagnosis not present

## 2021-12-15 DIAGNOSIS — D509 Iron deficiency anemia, unspecified: Secondary | ICD-10-CM | POA: Diagnosis not present

## 2021-12-15 DIAGNOSIS — N186 End stage renal disease: Secondary | ICD-10-CM | POA: Diagnosis not present

## 2021-12-15 DIAGNOSIS — N2581 Secondary hyperparathyroidism of renal origin: Secondary | ICD-10-CM | POA: Diagnosis not present

## 2021-12-15 NOTE — Telephone Encounter (Signed)
Louis [name] wife called and said she is concerned about the color of his urine over the last couple of days because he is a dialysis patient. She wants to make sure he doesn't have any blood in his urine. Dr. Madilyn Fireman doesn't have anything scheduled for tomorrow and his wife said he only wants to see Dr. Madilyn Fireman. Please advise.

## 2021-12-15 NOTE — Telephone Encounter (Signed)
Pt scheduled for tomorrow

## 2021-12-16 ENCOUNTER — Encounter: Payer: Self-pay | Admitting: Family Medicine

## 2021-12-16 ENCOUNTER — Ambulatory Visit (INDEPENDENT_AMBULATORY_CARE_PROVIDER_SITE_OTHER): Payer: Medicare Other | Admitting: Family Medicine

## 2021-12-16 ENCOUNTER — Other Ambulatory Visit: Payer: Self-pay

## 2021-12-16 VITALS — BP 136/65 | HR 75 | Resp 18

## 2021-12-16 DIAGNOSIS — E1129 Type 2 diabetes mellitus with other diabetic kidney complication: Secondary | ICD-10-CM | POA: Diagnosis not present

## 2021-12-16 DIAGNOSIS — I2510 Atherosclerotic heart disease of native coronary artery without angina pectoris: Secondary | ICD-10-CM

## 2021-12-16 DIAGNOSIS — E1142 Type 2 diabetes mellitus with diabetic polyneuropathy: Secondary | ICD-10-CM | POA: Diagnosis not present

## 2021-12-16 DIAGNOSIS — N186 End stage renal disease: Secondary | ICD-10-CM | POA: Diagnosis not present

## 2021-12-16 DIAGNOSIS — L89623 Pressure ulcer of left heel, stage 3: Secondary | ICD-10-CM | POA: Diagnosis not present

## 2021-12-16 DIAGNOSIS — Z992 Dependence on renal dialysis: Secondary | ICD-10-CM | POA: Diagnosis not present

## 2021-12-16 DIAGNOSIS — R300 Dysuria: Secondary | ICD-10-CM | POA: Diagnosis not present

## 2021-12-16 DIAGNOSIS — R3129 Other microscopic hematuria: Secondary | ICD-10-CM | POA: Diagnosis not present

## 2021-12-16 DIAGNOSIS — D631 Anemia in chronic kidney disease: Secondary | ICD-10-CM | POA: Diagnosis not present

## 2021-12-16 DIAGNOSIS — E11610 Type 2 diabetes mellitus with diabetic neuropathic arthropathy: Secondary | ICD-10-CM

## 2021-12-16 LAB — POCT URINALYSIS DIP (CLINITEK)
Bilirubin, UA: NEGATIVE
Glucose, UA: NEGATIVE mg/dL
Ketones, POC UA: NEGATIVE mg/dL
Nitrite, UA: NEGATIVE
POC PROTEIN,UA: >=300 — AB
Spec Grav, UA: 1.025 (ref 1.010–1.025)
Urobilinogen, UA: 0.2 E.U./dL
pH, UA: 6 (ref 5.0–8.0)

## 2021-12-16 NOTE — Progress Notes (Signed)
Acute Office Visit  Subjective:    Patient ID: Louis [name], male    DOB: Nov 14, 1946, 76 y.o.   MRN: [id number]  Chief Complaint  Patient presents with   Urine concern    Patient states his urine has been brown in color for 3 weeks and is concerned about possible blood in urine.     HPI Patient is in today for Patient states his urine has been brown in color for 3 weeks and is concerned about possible blood in urine.  He has not had any pain or discomfort or burning.  His wife says she has noticed it occasionally but usually it goes away after a day or 2 but this time it has not.  He is on: Eliquis so she is concerned about whether or not it could be blood.  She denies any particularly foul odor etc.  And that he is on dialysis he does still make urine and feels like he is making the same amount of urine.  Did have his A1c done at dialysis last week and it was 6.8 which is fantastic.  He also let me know that he recently saw cardiology and just had some up-to-date blood work done with Dr. Stanford Breed.  He said he got a good checkup.  Still seeing the retina specialist every 5 weeks.  And reports that his last eye exam was up-to-date.  Past Medical History:  Diagnosis Date   Arthritis    Brittle bones    per pt, has soft bones in right foot/wears boot cast!   CAD (coronary artery disease)    Cancer ([place])    skin cancer on arm   Cataract    Bil/ surg scheduled for right eye 01/18/17/ left eye 02/08/17   Charcot ankle, right 2019   CHF (congestive heart failure) ([place]) 2015   Diabetes mellitus    Type 2   Heart failure, diastolic ([place])    History of kidney stones    Hyperlipidemia    Hypertension    Macular degeneration disease    Macular edema 2014   OSA on CPAP    Paroxysmal atrial fibrillation ([place])    Personal history of colonic polyps - adenomas 01/28/2014   Renal insufficiency    [facility]   Shortness of breath dyspnea    Syncope and collapse     Past  Surgical History:  Procedure Laterality Date   AV FISTULA PLACEMENT Left 09/01/2020   Procedure: LEFT ARM ARTERIOVENOUS (AV) FISTULA;  Surgeon: Waynetta Sandy, MD;  Location: [facility];  Service: Vascular;  Laterality: Left;   [place] Left 10/27/2020   Procedure: LEFT SECOND STAGE BASILIC VEIN FISTULA TRANSPOSITION;  Surgeon: Waynetta Sandy, MD;  Location: ;  Service: Vascular;  Laterality: Left;   CARPAL TUNNEL RELEASE     left hand   COLONOSCOPY     INTRAVASCULAR PRESSURE WIRE/FFR STUDY N/A 12/18/2018   Procedure: INTRAVASCULAR PRESSURE WIRE/FFR STUDY;  Surgeon: Wellington Hampshire, MD;  Location: [facility];  Service: Cardiovascular;  Laterality: N/A;   IR FLUORO GUIDE CV LINE RIGHT  12/16/2018   IR US GUIDE VASC ACCESS RIGHT  12/16/2018   KNEE ARTHROSCOPY Right 09/13/2016   [facility], Dr. Dorna Leitz   PILONIDAL CYST EXCISION     RIGHT/LEFT HEART CATH AND CORONARY ANGIOGRAPHY N/A 12/18/2018   Procedure: RIGHT/LEFT HEART CATH AND CORONARY ANGIOGRAPHY;  Surgeon: Wellington Hampshire, MD;  Location: [facility];  Service:  Cardiovascular;  Laterality: N/A;    Family History  Problem Relation Age of Onset   Diabetes Mother    Kidney disease Mother    Diabetes Father    Coronary artery disease Father    Diabetes Brother    Diabetes Sister    Prostate cancer Maternal Uncle    Diabetes Maternal Grandmother    Cancer Paternal Grandmother        unknown   Heart attack Paternal Grandfather    Colon cancer Neg Hx     Social History   Socioeconomic History   Marital status: Married    Spouse name: [name]   Number of children: 5   Years of education: 12   Highest education level: 12th grade  Occupational History   Occupation: Warehouse    Comment: retired  Tobacco Use   Smoking status: Former    Packs/day: 0.50    Years: 20.00    Pack years: 10.00    Types: Cigarettes    Quit date: 03/06/1977    Years since quitting:  44.8   Smokeless tobacco: Never  Vaping Use   Vaping Use: Never used  Substance and Sexual Activity   Alcohol use: No   Drug use: No   Sexual activity: Not Currently  Other Topics Concern   Not on file  Social History Narrative   Married wife has metastatic breast cancer   Retired Proofreader work   5 children   Former smoker no alcohol tobacco or drug use   Was very active with his church group is a coffee drinker as well   Social Determinants of Sales executive: Low Risk    Difficulty of Paying Living Expenses: Not hard at all  Food Insecurity: No Food Insecurity   Worried About Charity fundraiser in the Last Year: Never true   Arboriculturist in the Last Year: Never true  Transportation Needs: No Transportation Needs   Lack of Transportation (Medical): No   Lack of Transportation (Non-Medical): No  Physical Activity: Inactive   Days of Exercise per Week: 0 days   Minutes of Exercise per Session: 0 min  Stress: No Stress Concern Present   Feeling of Stress : Not at all  Social Connections: Moderately Integrated   Frequency of Communication with Friends and Family: Once a week   Frequency of Social Gatherings with Friends and Family: More than three times a week   Attends Religious Services: More than 4 times per year   Active Member of [employer] or Organizations: No   Attends Archivist Meetings: Never   Marital Status: Married  Human resources officer Violence: Not At Risk   Fear of Current or Ex-Partner: No   Emotionally Abused: No   Physically Abused: No   Sexually Abused: No    Outpatient Medications Prior to Visit  Medication Sig Dispense Refill   allopurinol (ZYLOPRIM) 100 MG tablet Take 1 tablet (100 mg total) by mouth daily. 90 tablet 1   AMBULATORY NON FORMULARY MEDICATION Continuous positive airway pressure (CPAP) device: Auto titrate minimum 4 cm H20 to maximum of 20 cm H2O with pressure. Please provide all supplemental supplies as  needed. Fax to: [phone] 1 Units 1   AMBULATORY NON FORMULARY MEDICATION CPAP supplies - Disposable filters (white), Replaceable pillow or cushions, Mask and Tubing as needed. 1 each prn   B-D INS SYR ULTRAFINE 1CC/31G 31G X 5/16" 1 ML MISC      blood glucose meter kit  and supplies Use to check blood sugars daily E11.65     Cholecalciferol (VITAMIN D3) 50 MCG (2000 UT) TABS Take 2,000 Units by mouth daily.      Coenzyme Q10 (COQ10) 200 MG CAPS Take 200 mg by mouth daily.     Continuous Blood Gluc Receiver (FREESTYLE LIBRE 14 DAY READER) DEVI Dx DM E11.22 Check blood sugar 4 times daily. 1 each prn   Continuous Blood Gluc Sensor (FREESTYLE LIBRE 14 DAY SENSOR) MISC Dx DM E11.22 Check blood sugar 4 times daily. 1 each prn   ELIQUIS 5 MG TABS tablet Take 1 tablet (5 mg total) by mouth 2 (two) times daily. 180 tablet 3   fluconazole (DIFLUCAN) 200 MG tablet Take 200 mg by mouth. Once a week.     furosemide (LASIX) 80 MG tablet TAKE 2 TABLETS BY MOUTH TWICE DAILY IN  MORNING  AND  AFTER  LUNCH 360 tablet 0   gabapentin (NEURONTIN) 300 MG capsule Take 1 capsule by mouth once daily 90 capsule 0   hydrALAZINE (APRESOLINE) 100 MG tablet TAKE 1 TABLET BY MOUTH THREE TIMES DAILY 270 tablet 2   isosorbide dinitrate (ISORDIL) 20 MG tablet Take 1 tablet (20 mg total) by mouth 2 (two) times daily. 180 tablet 3   lidocaine-prilocaine (EMLA) cream      mupirocin ointment (BACTROBAN) 2 % APPLY TOPICALLY TWICE DAILY. APPLY TO INSIDE OF EACH NARES DAILY FOR 10 DAYS THEN TWICE A WEEK FOR MAINTENANCE. 22 g 0   nitroGLYCERIN (NITROSTAT) 0.4 MG SL tablet Place 1 tablet (0.4 mg total) under the tongue every 5 (five) minutes as needed for chest pain. 25 tablet 11   NOVOLIN N 100 UNIT/ML injection Inject 20-30 Units into the skin See admin instructions. Inject 30 units subcutaneously in the morning & inject 20-30 units subcutaneously at bedtime (depending on blood sugar)     NOVOLIN R 100 UNIT/ML injection Inject 8-20  Units into the skin 3 (three) times daily with meals. Sliding Scale  5   omega-3 acid ethyl esters (LOVAZA) 1 g capsule Take 2 g by mouth daily.     ONETOUCH ULTRA test strip USE 1 STRIP TO CHECK GLUCOSE THREE TIMES DAILY 300 each 0   Probiotic Product (PROBIOTIC DAILY PO) Take 420 mg by mouth daily.     rosuvastatin (CRESTOR) 40 MG tablet Take 1 tablet by mouth once daily 90 tablet 3   triamcinolone ointment (KENALOG) 0.5 % Apply 1 application topically 2 (two) times daily. To affected area, avoid eyes and face 30 g 3   Vitamin Mixture (VITAMIN E COMPLETE PO) Take 1 capsule by mouth daily. Vitamin E complex     ZINC SULFATE PO Take 1 tablet by mouth daily.      Facility-Administered Medications Prior to Visit  Medication Dose Route Frequency Provider Last Rate Last Admin   0.9 %  sodium chloride infusion  500 mL Intravenous Once Gatha Mayer, MD        Allergies  Allergen Reactions   Hydrocodone Nausea And Vomiting    Other reaction(s): GI Upset (intolerance) Projectile vomiting    Oxycodone Nausea And Vomiting    Other reaction(s): GI Upset (intolerance), Vomiting (intolerance) Projectile vomiting    Vancomycin Shortness Of Breath    ORAL VANCOMYCIN for C diff.   Dacarbazine Other (See Comments)    Unknown reaction   Tape Dermatitis, Itching and Rash    Patch used at dialysis    Review of Systems  Objective:    Physical Exam Vitals reviewed.  Constitutional:      Appearance: He is well-developed.  HENT:     Head: Normocephalic and atraumatic.  Eyes:     Conjunctiva/sclera: Conjunctivae normal.  Cardiovascular:     Rate and Rhythm: Normal rate.  Pulmonary:     Effort: Pulmonary effort is normal.  Skin:    General: Skin is dry.     Coloration: Skin is not pale.  Neurological:     Mental Status: He is alert and oriented to person, place, and time.  Psychiatric:        Behavior: Behavior normal.    BP 136/65    Pulse 75    Resp 18    SpO2 99%  Wt  Readings from Last 3 Encounters:  11/28/21 220 lb (99.8 kg)  10/19/21 223 lb 5.2 oz (101.3 kg)  03/14/21 235 lb 6.4 oz (106.8 kg)    Health Maintenance Due  Topic Date Due   HEMOGLOBIN A1C  09/17/2021    There are no preventive care reminders to display for this patient.   Lab Results  Component Value Date   TSH 2.391 02/02/2017   Lab Results  Component Value Date   WBC 11.4 09/09/2020   HGB 12.9 (L) 10/27/2020   HCT 38.0 (L) 10/27/2020   MCV 101 09/09/2020   PLT 234 03/08/2020   Lab Results  Component Value Date   NA 135 10/27/2020   K 4.1 10/27/2020   CO2 27 10/27/2019   GLUCOSE 184 (H) 10/27/2020   BUN 43 (H) 10/27/2020   CREATININE 3.90 (H) 10/27/2020   BILITOT 1.1 12/05/2021   ALKPHOS 105 10/27/2019   AST 16 12/05/2021   ALT 14 12/05/2021   PROT 6.8 12/05/2021   ALBUMIN 2.9 (A) 09/09/2020   CALCIUM 9.2 10/27/2019   ANIONGAP 13 10/27/2019   GFR 38.90 (L) 03/10/2011   Lab Results  Component Value Date   CHOL 83 12/05/2021   Lab Results  Component Value Date   HDL 37 (L) 12/05/2021   Lab Results  Component Value Date   LDLCALC 29 12/05/2021   Lab Results  Component Value Date   TRIG 90 12/05/2021   Lab Results  Component Value Date   CHOLHDL 2.2 12/05/2021   Lab Results  Component Value Date   HGBA1C 6.6 (A) 03/18/2021       Assessment & Plan:   Problem List Items Addressed This Visit       Endocrine   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  We will call to get a copy of labs from his dialysis center and get that abstracted.  Clines vaccinations today.  Overall he feels like he is doing well.  No specific concerns with his medications today.        Genitourinary   ESRD (end stage renal disease) ([place])    On dialysis.  We will call to get most up-to-date labs.      Other Visit Diagnoses     Dysuria    -  Primary   Relevant Orders   POCT URINALYSIS DIP (CLINITEK)  (Completed)   Urinalysis, microscopic only   Other microscopic hematuria          Urinalysis did show large amount of blood and greater than 300 protein.  He is on dialysis.  We will send the specimen for microscopic review to so that we can  verify if there is a large amount of whole red blood cells in which case he could certainly be bleeding from somewhere inside his bladder.  If the urine clears up let us know and we can always repeat the sample to see if it improves.  But for now continue with Eliquis I do not recommend any changes or dose adjustment.  He declines any and all vaccines today.  No orders of the defined types were placed in this encounter.    [name], MD

## 2021-12-16 NOTE — Assessment & Plan Note (Addendum)
Orts his last A1c was 6.8 last week.  We will call to get a copy of labs from his dialysis center and get that abstracted.  Clines vaccinations today.  Overall he feels like he is doing well.  No specific concerns with his medications today.

## 2021-12-16 NOTE — Assessment & Plan Note (Signed)
On dialysis.  We will call to get most up-to-date labs.

## 2021-12-17 DIAGNOSIS — D509 Iron deficiency anemia, unspecified: Secondary | ICD-10-CM | POA: Diagnosis not present

## 2021-12-17 DIAGNOSIS — Z992 Dependence on renal dialysis: Secondary | ICD-10-CM | POA: Diagnosis not present

## 2021-12-17 DIAGNOSIS — E1122 Type 2 diabetes mellitus with diabetic chronic kidney disease: Secondary | ICD-10-CM | POA: Diagnosis not present

## 2021-12-17 DIAGNOSIS — N2581 Secondary hyperparathyroidism of renal origin: Secondary | ICD-10-CM | POA: Diagnosis not present

## 2021-12-17 DIAGNOSIS — N186 End stage renal disease: Secondary | ICD-10-CM | POA: Diagnosis not present

## 2021-12-19 DIAGNOSIS — L89623 Pressure ulcer of left heel, stage 3: Secondary | ICD-10-CM | POA: Diagnosis not present

## 2021-12-19 DIAGNOSIS — E1142 Type 2 diabetes mellitus with diabetic polyneuropathy: Secondary | ICD-10-CM | POA: Diagnosis not present

## 2021-12-19 DIAGNOSIS — N186 End stage renal disease: Secondary | ICD-10-CM | POA: Diagnosis not present

## 2021-12-19 DIAGNOSIS — E1129 Type 2 diabetes mellitus with other diabetic kidney complication: Secondary | ICD-10-CM | POA: Diagnosis not present

## 2021-12-19 DIAGNOSIS — Z992 Dependence on renal dialysis: Secondary | ICD-10-CM | POA: Diagnosis not present

## 2021-12-19 DIAGNOSIS — D631 Anemia in chronic kidney disease: Secondary | ICD-10-CM | POA: Diagnosis not present

## 2021-12-20 DIAGNOSIS — N2581 Secondary hyperparathyroidism of renal origin: Secondary | ICD-10-CM | POA: Diagnosis not present

## 2021-12-20 DIAGNOSIS — Z992 Dependence on renal dialysis: Secondary | ICD-10-CM | POA: Diagnosis not present

## 2021-12-20 DIAGNOSIS — D509 Iron deficiency anemia, unspecified: Secondary | ICD-10-CM | POA: Diagnosis not present

## 2021-12-20 DIAGNOSIS — N186 End stage renal disease: Secondary | ICD-10-CM | POA: Diagnosis not present

## 2021-12-20 DIAGNOSIS — E1122 Type 2 diabetes mellitus with diabetic chronic kidney disease: Secondary | ICD-10-CM | POA: Diagnosis not present

## 2021-12-21 DIAGNOSIS — E1142 Type 2 diabetes mellitus with diabetic polyneuropathy: Secondary | ICD-10-CM | POA: Diagnosis not present

## 2021-12-21 DIAGNOSIS — N186 End stage renal disease: Secondary | ICD-10-CM | POA: Diagnosis not present

## 2021-12-21 DIAGNOSIS — L89623 Pressure ulcer of left heel, stage 3: Secondary | ICD-10-CM | POA: Diagnosis not present

## 2021-12-21 DIAGNOSIS — Z794 Long term (current) use of insulin: Secondary | ICD-10-CM | POA: Diagnosis not present

## 2021-12-21 DIAGNOSIS — Z992 Dependence on renal dialysis: Secondary | ICD-10-CM | POA: Diagnosis not present

## 2021-12-21 DIAGNOSIS — D631 Anemia in chronic kidney disease: Secondary | ICD-10-CM | POA: Diagnosis not present

## 2021-12-21 DIAGNOSIS — N2581 Secondary hyperparathyroidism of renal origin: Secondary | ICD-10-CM | POA: Diagnosis not present

## 2021-12-21 DIAGNOSIS — E113213 Type 2 diabetes mellitus with mild nonproliferative diabetic retinopathy with macular edema, bilateral: Secondary | ICD-10-CM | POA: Diagnosis not present

## 2021-12-21 DIAGNOSIS — H524 Presbyopia: Secondary | ICD-10-CM | POA: Diagnosis not present

## 2021-12-21 DIAGNOSIS — D509 Iron deficiency anemia, unspecified: Secondary | ICD-10-CM | POA: Diagnosis not present

## 2021-12-21 DIAGNOSIS — E1122 Type 2 diabetes mellitus with diabetic chronic kidney disease: Secondary | ICD-10-CM | POA: Diagnosis not present

## 2021-12-21 DIAGNOSIS — Z961 Presence of intraocular lens: Secondary | ICD-10-CM | POA: Diagnosis not present

## 2021-12-21 DIAGNOSIS — E1129 Type 2 diabetes mellitus with other diabetic kidney complication: Secondary | ICD-10-CM | POA: Diagnosis not present

## 2021-12-21 LAB — URINALYSIS, MICROSCOPIC ONLY: Squamous Epithelial / HPF: NONE SEEN /HPF (ref <=5)

## 2021-12-21 MED ORDER — FLUCONAZOLE 150 MG PO TABS: 150.0000 mg | ORAL_TABLET | Freq: Every day | ORAL | 1 refills | Status: DC

## 2021-12-21 MED ORDER — CEPHALEXIN 500 MG PO CAPS: 500.0000 mg | ORAL_CAPSULE | Freq: Every day | ORAL | 0 refills | Status: DC

## 2021-12-21 NOTE — Progress Notes (Signed)
Hi Louis [name] and Louis [name], it does look like Louis [name] has a little bit of bacteria and may be some yeast in the urine.  I will get a go ahead and send over an antibiotic and an antifungal for that.  Let me know if you are still noticing the abnormal urine color.  Lease call the dialysis center so we can get a copy of his last A1c and get that abstracted.

## 2021-12-21 NOTE — Addendum Note (Signed)
Addended by: [name] D on: 12/21/2021 05:53 PM   Modules accepted: Orders

## 2021-12-22 DIAGNOSIS — Z992 Dependence on renal dialysis: Secondary | ICD-10-CM | POA: Diagnosis not present

## 2021-12-22 DIAGNOSIS — N186 End stage renal disease: Secondary | ICD-10-CM | POA: Diagnosis not present

## 2021-12-22 DIAGNOSIS — Z794 Long term (current) use of insulin: Secondary | ICD-10-CM | POA: Diagnosis not present

## 2021-12-22 DIAGNOSIS — H524 Presbyopia: Secondary | ICD-10-CM | POA: Diagnosis not present

## 2021-12-22 DIAGNOSIS — E113213 Type 2 diabetes mellitus with mild nonproliferative diabetic retinopathy with macular edema, bilateral: Secondary | ICD-10-CM | POA: Diagnosis not present

## 2021-12-22 DIAGNOSIS — E1122 Type 2 diabetes mellitus with diabetic chronic kidney disease: Secondary | ICD-10-CM | POA: Diagnosis not present

## 2021-12-22 DIAGNOSIS — D509 Iron deficiency anemia, unspecified: Secondary | ICD-10-CM | POA: Diagnosis not present

## 2021-12-22 DIAGNOSIS — Z961 Presence of intraocular lens: Secondary | ICD-10-CM | POA: Diagnosis not present

## 2021-12-22 DIAGNOSIS — N2581 Secondary hyperparathyroidism of renal origin: Secondary | ICD-10-CM | POA: Diagnosis not present

## 2021-12-22 LAB — HM DIABETES EYE EXAM

## 2021-12-22 NOTE — Progress Notes (Signed)
Hi Gail, lets go ahead and at least treat the yeast.  It looks like he may already take Diflucan weekly am not sure.  So he may not necessarily need an extra pill.  If you are not noticing any blood in the urine we can always hold off if he is asymptomatic.  It may just be that he is colonized so I think it would be reasonable unless he is having burning with urination or you are still noticing a lot of blood.

## 2021-12-23 DIAGNOSIS — L89623 Pressure ulcer of left heel, stage 3: Secondary | ICD-10-CM | POA: Diagnosis not present

## 2021-12-23 DIAGNOSIS — E1129 Type 2 diabetes mellitus with other diabetic kidney complication: Secondary | ICD-10-CM | POA: Diagnosis not present

## 2021-12-23 DIAGNOSIS — E1142 Type 2 diabetes mellitus with diabetic polyneuropathy: Secondary | ICD-10-CM | POA: Diagnosis not present

## 2021-12-23 DIAGNOSIS — Z992 Dependence on renal dialysis: Secondary | ICD-10-CM | POA: Diagnosis not present

## 2021-12-23 DIAGNOSIS — D631 Anemia in chronic kidney disease: Secondary | ICD-10-CM | POA: Diagnosis not present

## 2021-12-23 DIAGNOSIS — N186 End stage renal disease: Secondary | ICD-10-CM | POA: Diagnosis not present

## 2021-12-24 DIAGNOSIS — N186 End stage renal disease: Secondary | ICD-10-CM | POA: Diagnosis not present

## 2021-12-24 DIAGNOSIS — E1122 Type 2 diabetes mellitus with diabetic chronic kidney disease: Secondary | ICD-10-CM | POA: Diagnosis not present

## 2021-12-24 DIAGNOSIS — Z992 Dependence on renal dialysis: Secondary | ICD-10-CM | POA: Diagnosis not present

## 2021-12-24 DIAGNOSIS — D509 Iron deficiency anemia, unspecified: Secondary | ICD-10-CM | POA: Diagnosis not present

## 2021-12-24 DIAGNOSIS — N2581 Secondary hyperparathyroidism of renal origin: Secondary | ICD-10-CM | POA: Diagnosis not present

## 2021-12-26 DIAGNOSIS — D631 Anemia in chronic kidney disease: Secondary | ICD-10-CM | POA: Diagnosis not present

## 2021-12-26 DIAGNOSIS — N186 End stage renal disease: Secondary | ICD-10-CM | POA: Diagnosis not present

## 2021-12-26 DIAGNOSIS — E1142 Type 2 diabetes mellitus with diabetic polyneuropathy: Secondary | ICD-10-CM | POA: Diagnosis not present

## 2021-12-26 DIAGNOSIS — L89623 Pressure ulcer of left heel, stage 3: Secondary | ICD-10-CM | POA: Diagnosis not present

## 2021-12-26 DIAGNOSIS — E1129 Type 2 diabetes mellitus with other diabetic kidney complication: Secondary | ICD-10-CM | POA: Diagnosis not present

## 2021-12-26 DIAGNOSIS — Z992 Dependence on renal dialysis: Secondary | ICD-10-CM | POA: Diagnosis not present

## 2021-12-26 NOTE — Progress Notes (Signed)
No, just take the 1 it should be Diflucan.

## 2021-12-27 DIAGNOSIS — Z992 Dependence on renal dialysis: Secondary | ICD-10-CM | POA: Diagnosis not present

## 2021-12-27 DIAGNOSIS — N186 End stage renal disease: Secondary | ICD-10-CM | POA: Diagnosis not present

## 2021-12-27 DIAGNOSIS — E1122 Type 2 diabetes mellitus with diabetic chronic kidney disease: Secondary | ICD-10-CM | POA: Diagnosis not present

## 2021-12-27 DIAGNOSIS — N2581 Secondary hyperparathyroidism of renal origin: Secondary | ICD-10-CM | POA: Diagnosis not present

## 2021-12-27 DIAGNOSIS — D509 Iron deficiency anemia, unspecified: Secondary | ICD-10-CM | POA: Diagnosis not present

## 2021-12-28 DIAGNOSIS — L89623 Pressure ulcer of left heel, stage 3: Secondary | ICD-10-CM | POA: Diagnosis not present

## 2021-12-28 DIAGNOSIS — D631 Anemia in chronic kidney disease: Secondary | ICD-10-CM | POA: Diagnosis not present

## 2021-12-28 DIAGNOSIS — N186 End stage renal disease: Secondary | ICD-10-CM | POA: Diagnosis not present

## 2021-12-28 DIAGNOSIS — Z992 Dependence on renal dialysis: Secondary | ICD-10-CM | POA: Diagnosis not present

## 2021-12-28 DIAGNOSIS — E1129 Type 2 diabetes mellitus with other diabetic kidney complication: Secondary | ICD-10-CM | POA: Diagnosis not present

## 2021-12-28 DIAGNOSIS — E1142 Type 2 diabetes mellitus with diabetic polyneuropathy: Secondary | ICD-10-CM | POA: Diagnosis not present

## 2021-12-29 DIAGNOSIS — N2581 Secondary hyperparathyroidism of renal origin: Secondary | ICD-10-CM | POA: Diagnosis not present

## 2021-12-29 DIAGNOSIS — L89623 Pressure ulcer of left heel, stage 3: Secondary | ICD-10-CM | POA: Diagnosis not present

## 2021-12-29 DIAGNOSIS — I110 Hypertensive heart disease with heart failure: Secondary | ICD-10-CM | POA: Diagnosis not present

## 2021-12-29 DIAGNOSIS — R2681 Unsteadiness on feet: Secondary | ICD-10-CM | POA: Diagnosis not present

## 2021-12-29 DIAGNOSIS — N186 End stage renal disease: Secondary | ICD-10-CM | POA: Diagnosis not present

## 2021-12-29 DIAGNOSIS — I482 Chronic atrial fibrillation, unspecified: Secondary | ICD-10-CM | POA: Diagnosis not present

## 2021-12-29 DIAGNOSIS — Z992 Dependence on renal dialysis: Secondary | ICD-10-CM | POA: Diagnosis not present

## 2021-12-29 DIAGNOSIS — E1122 Type 2 diabetes mellitus with diabetic chronic kidney disease: Secondary | ICD-10-CM | POA: Diagnosis not present

## 2021-12-29 DIAGNOSIS — Z20822 Contact with and (suspected) exposure to covid-19: Secondary | ICD-10-CM | POA: Diagnosis not present

## 2021-12-29 DIAGNOSIS — H52209 Unspecified astigmatism, unspecified eye: Secondary | ICD-10-CM | POA: Diagnosis not present

## 2021-12-29 DIAGNOSIS — D509 Iron deficiency anemia, unspecified: Secondary | ICD-10-CM | POA: Diagnosis not present

## 2021-12-29 DIAGNOSIS — E1129 Type 2 diabetes mellitus with other diabetic kidney complication: Secondary | ICD-10-CM | POA: Diagnosis not present

## 2021-12-29 DIAGNOSIS — D631 Anemia in chronic kidney disease: Secondary | ICD-10-CM | POA: Diagnosis not present

## 2021-12-29 DIAGNOSIS — M545 Low back pain, unspecified: Secondary | ICD-10-CM | POA: Diagnosis not present

## 2021-12-29 DIAGNOSIS — Z7901 Long term (current) use of anticoagulants: Secondary | ICD-10-CM | POA: Diagnosis not present

## 2021-12-29 DIAGNOSIS — M6281 Muscle weakness (generalized): Secondary | ICD-10-CM | POA: Diagnosis not present

## 2021-12-29 DIAGNOSIS — E1142 Type 2 diabetes mellitus with diabetic polyneuropathy: Secondary | ICD-10-CM | POA: Diagnosis not present

## 2021-12-29 DIAGNOSIS — I509 Heart failure, unspecified: Secondary | ICD-10-CM | POA: Diagnosis not present

## 2021-12-29 DIAGNOSIS — I2510 Atherosclerotic heart disease of native coronary artery without angina pectoris: Secondary | ICD-10-CM | POA: Diagnosis not present

## 2021-12-30 DIAGNOSIS — Z992 Dependence on renal dialysis: Secondary | ICD-10-CM | POA: Diagnosis not present

## 2021-12-30 DIAGNOSIS — E1142 Type 2 diabetes mellitus with diabetic polyneuropathy: Secondary | ICD-10-CM | POA: Diagnosis not present

## 2021-12-30 DIAGNOSIS — L89623 Pressure ulcer of left heel, stage 3: Secondary | ICD-10-CM | POA: Diagnosis not present

## 2021-12-30 DIAGNOSIS — D631 Anemia in chronic kidney disease: Secondary | ICD-10-CM | POA: Diagnosis not present

## 2021-12-30 DIAGNOSIS — N186 End stage renal disease: Secondary | ICD-10-CM | POA: Diagnosis not present

## 2021-12-30 DIAGNOSIS — E1129 Type 2 diabetes mellitus with other diabetic kidney complication: Secondary | ICD-10-CM | POA: Diagnosis not present

## 2021-12-31 DIAGNOSIS — N2581 Secondary hyperparathyroidism of renal origin: Secondary | ICD-10-CM | POA: Diagnosis not present

## 2021-12-31 DIAGNOSIS — D509 Iron deficiency anemia, unspecified: Secondary | ICD-10-CM | POA: Diagnosis not present

## 2021-12-31 DIAGNOSIS — E1122 Type 2 diabetes mellitus with diabetic chronic kidney disease: Secondary | ICD-10-CM | POA: Diagnosis not present

## 2021-12-31 DIAGNOSIS — Z992 Dependence on renal dialysis: Secondary | ICD-10-CM | POA: Diagnosis not present

## 2021-12-31 DIAGNOSIS — N186 End stage renal disease: Secondary | ICD-10-CM | POA: Diagnosis not present

## 2022-01-03 DIAGNOSIS — N186 End stage renal disease: Secondary | ICD-10-CM | POA: Diagnosis not present

## 2022-01-03 DIAGNOSIS — Z992 Dependence on renal dialysis: Secondary | ICD-10-CM | POA: Diagnosis not present

## 2022-01-03 DIAGNOSIS — E1122 Type 2 diabetes mellitus with diabetic chronic kidney disease: Secondary | ICD-10-CM | POA: Diagnosis not present

## 2022-01-03 DIAGNOSIS — N2581 Secondary hyperparathyroidism of renal origin: Secondary | ICD-10-CM | POA: Diagnosis not present

## 2022-01-03 DIAGNOSIS — D509 Iron deficiency anemia, unspecified: Secondary | ICD-10-CM | POA: Diagnosis not present

## 2022-01-04 DIAGNOSIS — D631 Anemia in chronic kidney disease: Secondary | ICD-10-CM | POA: Diagnosis not present

## 2022-01-04 DIAGNOSIS — E1129 Type 2 diabetes mellitus with other diabetic kidney complication: Secondary | ICD-10-CM | POA: Diagnosis not present

## 2022-01-04 DIAGNOSIS — E1142 Type 2 diabetes mellitus with diabetic polyneuropathy: Secondary | ICD-10-CM | POA: Diagnosis not present

## 2022-01-04 DIAGNOSIS — N186 End stage renal disease: Secondary | ICD-10-CM | POA: Diagnosis not present

## 2022-01-04 DIAGNOSIS — L89623 Pressure ulcer of left heel, stage 3: Secondary | ICD-10-CM | POA: Diagnosis not present

## 2022-01-04 DIAGNOSIS — Z992 Dependence on renal dialysis: Secondary | ICD-10-CM | POA: Diagnosis not present

## 2022-01-04 DIAGNOSIS — Z20822 Contact with and (suspected) exposure to covid-19: Secondary | ICD-10-CM | POA: Diagnosis not present

## 2022-01-05 DIAGNOSIS — E1122 Type 2 diabetes mellitus with diabetic chronic kidney disease: Secondary | ICD-10-CM | POA: Diagnosis not present

## 2022-01-05 DIAGNOSIS — Z992 Dependence on renal dialysis: Secondary | ICD-10-CM | POA: Diagnosis not present

## 2022-01-05 DIAGNOSIS — D509 Iron deficiency anemia, unspecified: Secondary | ICD-10-CM | POA: Diagnosis not present

## 2022-01-05 DIAGNOSIS — N186 End stage renal disease: Secondary | ICD-10-CM | POA: Diagnosis not present

## 2022-01-05 DIAGNOSIS — N2581 Secondary hyperparathyroidism of renal origin: Secondary | ICD-10-CM | POA: Diagnosis not present

## 2022-01-06 DIAGNOSIS — N186 End stage renal disease: Secondary | ICD-10-CM | POA: Diagnosis not present

## 2022-01-06 DIAGNOSIS — L89623 Pressure ulcer of left heel, stage 3: Secondary | ICD-10-CM | POA: Diagnosis not present

## 2022-01-06 DIAGNOSIS — Z992 Dependence on renal dialysis: Secondary | ICD-10-CM | POA: Diagnosis not present

## 2022-01-06 DIAGNOSIS — D631 Anemia in chronic kidney disease: Secondary | ICD-10-CM | POA: Diagnosis not present

## 2022-01-06 DIAGNOSIS — E1142 Type 2 diabetes mellitus with diabetic polyneuropathy: Secondary | ICD-10-CM | POA: Diagnosis not present

## 2022-01-06 DIAGNOSIS — E1129 Type 2 diabetes mellitus with other diabetic kidney complication: Secondary | ICD-10-CM | POA: Diagnosis not present

## 2022-01-07 DIAGNOSIS — N186 End stage renal disease: Secondary | ICD-10-CM | POA: Diagnosis not present

## 2022-01-07 DIAGNOSIS — E1122 Type 2 diabetes mellitus with diabetic chronic kidney disease: Secondary | ICD-10-CM | POA: Diagnosis not present

## 2022-01-07 DIAGNOSIS — Z992 Dependence on renal dialysis: Secondary | ICD-10-CM | POA: Diagnosis not present

## 2022-01-07 DIAGNOSIS — D509 Iron deficiency anemia, unspecified: Secondary | ICD-10-CM | POA: Diagnosis not present

## 2022-01-07 DIAGNOSIS — N2581 Secondary hyperparathyroidism of renal origin: Secondary | ICD-10-CM | POA: Diagnosis not present

## 2022-01-09 ENCOUNTER — Other Ambulatory Visit: Payer: Self-pay | Admitting: Cardiology

## 2022-01-09 DIAGNOSIS — Z992 Dependence on renal dialysis: Secondary | ICD-10-CM | POA: Diagnosis not present

## 2022-01-09 DIAGNOSIS — E1129 Type 2 diabetes mellitus with other diabetic kidney complication: Secondary | ICD-10-CM | POA: Diagnosis not present

## 2022-01-09 DIAGNOSIS — L89623 Pressure ulcer of left heel, stage 3: Secondary | ICD-10-CM | POA: Diagnosis not present

## 2022-01-09 DIAGNOSIS — D631 Anemia in chronic kidney disease: Secondary | ICD-10-CM | POA: Diagnosis not present

## 2022-01-09 DIAGNOSIS — E1142 Type 2 diabetes mellitus with diabetic polyneuropathy: Secondary | ICD-10-CM | POA: Diagnosis not present

## 2022-01-09 DIAGNOSIS — N186 End stage renal disease: Secondary | ICD-10-CM | POA: Diagnosis not present

## 2022-01-10 DIAGNOSIS — E1122 Type 2 diabetes mellitus with diabetic chronic kidney disease: Secondary | ICD-10-CM | POA: Diagnosis not present

## 2022-01-10 DIAGNOSIS — N186 End stage renal disease: Secondary | ICD-10-CM | POA: Diagnosis not present

## 2022-01-10 DIAGNOSIS — Z992 Dependence on renal dialysis: Secondary | ICD-10-CM | POA: Diagnosis not present

## 2022-01-10 DIAGNOSIS — N2581 Secondary hyperparathyroidism of renal origin: Secondary | ICD-10-CM | POA: Diagnosis not present

## 2022-01-10 DIAGNOSIS — D509 Iron deficiency anemia, unspecified: Secondary | ICD-10-CM | POA: Diagnosis not present

## 2022-01-12 DIAGNOSIS — D509 Iron deficiency anemia, unspecified: Secondary | ICD-10-CM | POA: Diagnosis not present

## 2022-01-12 DIAGNOSIS — N2581 Secondary hyperparathyroidism of renal origin: Secondary | ICD-10-CM | POA: Diagnosis not present

## 2022-01-12 DIAGNOSIS — N186 End stage renal disease: Secondary | ICD-10-CM | POA: Diagnosis not present

## 2022-01-12 DIAGNOSIS — Z992 Dependence on renal dialysis: Secondary | ICD-10-CM | POA: Diagnosis not present

## 2022-01-12 DIAGNOSIS — E1122 Type 2 diabetes mellitus with diabetic chronic kidney disease: Secondary | ICD-10-CM | POA: Diagnosis not present

## 2022-01-13 DIAGNOSIS — E1142 Type 2 diabetes mellitus with diabetic polyneuropathy: Secondary | ICD-10-CM | POA: Diagnosis not present

## 2022-01-13 DIAGNOSIS — N186 End stage renal disease: Secondary | ICD-10-CM | POA: Diagnosis not present

## 2022-01-13 DIAGNOSIS — E1129 Type 2 diabetes mellitus with other diabetic kidney complication: Secondary | ICD-10-CM | POA: Diagnosis not present

## 2022-01-13 DIAGNOSIS — L89623 Pressure ulcer of left heel, stage 3: Secondary | ICD-10-CM | POA: Diagnosis not present

## 2022-01-13 DIAGNOSIS — Z992 Dependence on renal dialysis: Secondary | ICD-10-CM | POA: Diagnosis not present

## 2022-01-13 DIAGNOSIS — D631 Anemia in chronic kidney disease: Secondary | ICD-10-CM | POA: Diagnosis not present

## 2022-01-14 DIAGNOSIS — D509 Iron deficiency anemia, unspecified: Secondary | ICD-10-CM | POA: Diagnosis not present

## 2022-01-14 DIAGNOSIS — E1122 Type 2 diabetes mellitus with diabetic chronic kidney disease: Secondary | ICD-10-CM | POA: Diagnosis not present

## 2022-01-14 DIAGNOSIS — N2581 Secondary hyperparathyroidism of renal origin: Secondary | ICD-10-CM | POA: Diagnosis not present

## 2022-01-14 DIAGNOSIS — N186 End stage renal disease: Secondary | ICD-10-CM | POA: Diagnosis not present

## 2022-01-14 DIAGNOSIS — Z992 Dependence on renal dialysis: Secondary | ICD-10-CM | POA: Diagnosis not present

## 2022-01-16 ENCOUNTER — Ambulatory Visit (INDEPENDENT_AMBULATORY_CARE_PROVIDER_SITE_OTHER): Payer: Medicare Other | Admitting: Family Medicine

## 2022-01-16 ENCOUNTER — Encounter: Payer: Self-pay | Admitting: Family Medicine

## 2022-01-16 ENCOUNTER — Other Ambulatory Visit: Payer: Self-pay

## 2022-01-16 VITALS — BP 116/58 | HR 82 | Resp 18 | Ht 67.0 in

## 2022-01-16 DIAGNOSIS — D631 Anemia in chronic kidney disease: Secondary | ICD-10-CM | POA: Diagnosis not present

## 2022-01-16 DIAGNOSIS — I2510 Atherosclerotic heart disease of native coronary artery without angina pectoris: Secondary | ICD-10-CM

## 2022-01-16 DIAGNOSIS — L89623 Pressure ulcer of left heel, stage 3: Secondary | ICD-10-CM | POA: Diagnosis not present

## 2022-01-16 DIAGNOSIS — E11610 Type 2 diabetes mellitus with diabetic neuropathic arthropathy: Secondary | ICD-10-CM

## 2022-01-16 DIAGNOSIS — M1A9XX1 Chronic gout, unspecified, with tophus (tophi): Secondary | ICD-10-CM | POA: Diagnosis not present

## 2022-01-16 DIAGNOSIS — Z992 Dependence on renal dialysis: Secondary | ICD-10-CM | POA: Diagnosis not present

## 2022-01-16 DIAGNOSIS — I872 Venous insufficiency (chronic) (peripheral): Secondary | ICD-10-CM

## 2022-01-16 DIAGNOSIS — N186 End stage renal disease: Secondary | ICD-10-CM | POA: Diagnosis not present

## 2022-01-16 DIAGNOSIS — I10 Essential (primary) hypertension: Secondary | ICD-10-CM | POA: Diagnosis not present

## 2022-01-16 DIAGNOSIS — E1129 Type 2 diabetes mellitus with other diabetic kidney complication: Secondary | ICD-10-CM | POA: Diagnosis not present

## 2022-01-16 DIAGNOSIS — E1142 Type 2 diabetes mellitus with diabetic polyneuropathy: Secondary | ICD-10-CM | POA: Diagnosis not present

## 2022-01-16 MED ORDER — FUROSEMIDE 40 MG PO TABS: 40.0000 mg | ORAL_TABLET | Freq: Every day | ORAL | 1 refills | Status: DC | PRN

## 2022-01-16 NOTE — Assessment & Plan Note (Signed)
Blood pressure really looks great off of his medications.

## 2022-01-16 NOTE — Assessment & Plan Note (Signed)
Getting some trace pitting edema both lower extremities so I am going to send over a lower dose of furosemide to just use as needed.  He may not need it at all but just encouraged him and his wife to keep it just in case the swelling suddenly gets worse and they have something overnight or over the weekend.

## 2022-01-16 NOTE — Assessment & Plan Note (Signed)
Unfortunately we do not have the cartridges to check the A1c today but he can get this done with labs done at the dialysis center and then we can get it abstracted.  Glucose logs look fantastic so I think his A1c's, look good.

## 2022-01-16 NOTE — Patient Instructions (Signed)
Keep track of how often you use the lasix

## 2022-01-16 NOTE — Progress Notes (Signed)
Established Patient Office Visit  Subjective:  Patient ID: Louis [name], male    DOB: 01-07-1946  Age: 76 y.o. MRN: [id number]  CC:  Chief Complaint  Patient presents with   Diabetes    Follow up    Discuss medication    Patient would like to know if he should continue taking Allopurinol.    Diabetes Eye Exam    Done 12/2021 at [facility] in [place] presents for   Diabetes - no hypoglycemic events. No wounds or sores that are not healing well. No increased thirst or urination. Checking glucose at home. Taking medications as prescribed without any side effects.  Several of his medications were stopped when he was in rehab including his hydralazine, furosemide.  He really has not had any significant swelling since then.  He does still urinate even though he has end-stage renal disease and he is on dialysis.  He was previously on his gabapentin for neuropathy but says that he came off of it to see if it was really helping and did not notice any difference on it versus off of it and so prefers to just manage with Tylenol at this point.  Gout-he has not had a flare in years and wonders if he could potentially come off of his allopurinol.   Past Medical History:  Diagnosis Date   Arthritis    Brittle bones    per pt, has soft bones in right foot/wears boot cast!   CAD (coronary artery disease)    Cancer ([place])    skin cancer on arm   Cataract    Bil/ surg scheduled for right eye 01/18/17/ left eye 02/08/17   Charcot ankle, right 2019   CHF (congestive heart failure) ([place]) 2015   Diabetes mellitus    Type 2   Heart failure, diastolic ([place])    History of kidney stones    Hyperlipidemia    Hypertension    Macular degeneration disease    Macular edema 2014   OSA on CPAP    Paroxysmal atrial fibrillation ([place])    Personal history of colonic polyps - adenomas 01/28/2014   Renal insufficiency    Tu/Th/Sa Dialysis   Shortness of breath dyspnea     Syncope and collapse     Past Surgical History:  Procedure Laterality Date   AV FISTULA PLACEMENT Left 09/01/2020   Procedure: LEFT ARM ARTERIOVENOUS (AV) FISTULA;  Surgeon: Waynetta Sandy, MD;  Location: [place];  Service: Vascular;  Laterality: Left;   [place] Left 10/27/2020   Procedure: LEFT SECOND STAGE BASILIC VEIN FISTULA TRANSPOSITION;  Surgeon: Waynetta Sandy, MD;  Location: [place];  Service: Vascular;  Laterality: Left;   CARPAL TUNNEL RELEASE     left hand   COLONOSCOPY     INTRAVASCULAR PRESSURE WIRE/FFR STUDY N/A 12/18/2018   Procedure: INTRAVASCULAR PRESSURE WIRE/FFR STUDY;  Surgeon: Wellington Hampshire, MD;  Location: [place] [facility];  Service: Cardiovascular;  Laterality: N/A;   IR FLUORO GUIDE CV LINE RIGHT  12/16/2018   IR US GUIDE VASC ACCESS RIGHT  12/16/2018   KNEE ARTHROSCOPY Right 09/13/2016   [place] [facility], Dr. Dorna Leitz   PILONIDAL CYST EXCISION     RIGHT/LEFT HEART CATH AND CORONARY ANGIOGRAPHY N/A 12/18/2018   Procedure: RIGHT/LEFT HEART CATH AND CORONARY ANGIOGRAPHY;  Surgeon: Wellington Hampshire, MD;  Location: [place] [facility];  Service: Cardiovascular;  Laterality: N/A;    Family History  Problem Relation Age of Onset   Diabetes Mother    Kidney disease Mother    Diabetes Father    Coronary artery disease Father    Diabetes Brother    Diabetes Sister    Prostate cancer Maternal Uncle    Diabetes Maternal Grandmother    Cancer Paternal Grandmother        unknown   Heart attack Paternal Grandfather    Colon cancer Neg Hx     Social History   Socioeconomic History   Marital status: Married    Spouse name: [name]   Number of children: 5   Years of education: 12   Highest education level: 12th grade  Occupational History   Occupation: Warehouse    Comment: retired  Tobacco Use   Smoking status: Former    Packs/day: 0.50    Years: 20.00    Pack years: 10.00    Types: Cigarettes    Quit date:  03/06/1977    Years since quitting: 44.8   Smokeless tobacco: Never  Vaping Use   Vaping Use: Never used  Substance and Sexual Activity   Alcohol use: No   Drug use: No   Sexual activity: Not Currently  Other Topics Concern   Not on file  Social History Narrative   Married wife has metastatic breast cancer   Retired Proofreader work   5 children   Former smoker no alcohol tobacco or drug use   Was very active with his church group is a coffee drinker as well   Social Determinants of Sales executive: Low Risk    Difficulty of Paying Living Expenses: Not hard at all  Food Insecurity: No Food Insecurity   Worried About Charity fundraiser in the Last Year: Never true   Arboriculturist in the Last Year: Never true  Transportation Needs: No Transportation Needs   Lack of Transportation (Medical): No   Lack of Transportation (Non-Medical): No  Physical Activity: Inactive   Days of Exercise per Week: 0 days   Minutes of Exercise per Session: 0 min  Stress: No Stress Concern Present   Feeling of Stress : Not at all  Social Connections: Moderately Integrated   Frequency of Communication with Friends and Family: Once a week   Frequency of Social Gatherings with Friends and Family: More than three times a week   Attends Religious Services: More than 4 times per year   Active Member of [employer] or Organizations: No   Attends Music therapist: Never   Marital Status: Married  Human resources officer Violence: Not At Risk   Fear of Current or Ex-Partner: No   Emotionally Abused: No   Physically Abused: No   Sexually Abused: No    Outpatient Medications Prior to Visit  Medication Sig Dispense Refill   AMBULATORY NON FORMULARY MEDICATION Continuous positive airway pressure (CPAP) device: Auto titrate minimum 4 cm H20 to maximum of 20 cm H2O with pressure. Please provide all supplemental supplies as needed. Fax to: [phone] 1 Units 1   AMBULATORY NON FORMULARY  MEDICATION CPAP supplies - Disposable filters (white), Replaceable pillow or cushions, Mask and Tubing as needed. 1 each prn   B-D INS SYR ULTRAFINE 1CC/31G 31G X 5/16" 1 ML MISC      blood glucose meter kit and supplies Use to check blood sugars daily E11.65     Cholecalciferol (VITAMIN D3) 50 MCG (2000 UT) TABS Take 2,000 Units by mouth daily.  Coenzyme Q10 (COQ10) 200 MG CAPS Take 200 mg by mouth daily.     Continuous Blood Gluc Receiver (FREESTYLE LIBRE 14 DAY READER) DEVI Dx DM E11.22 Check blood sugar 4 times daily. 1 each prn   Continuous Blood Gluc Sensor (FREESTYLE LIBRE 14 DAY SENSOR) MISC Dx DM E11.22 Check blood sugar 4 times daily. 1 each prn   ELIQUIS 5 MG TABS tablet Take 1 tablet (5 mg total) by mouth 2 (two) times daily. 180 tablet 3   isosorbide dinitrate (ISORDIL) 20 MG tablet Take 1 tablet by mouth twice daily 180 tablet 1   nitroGLYCERIN (NITROSTAT) 0.4 MG SL tablet Place 1 tablet (0.4 mg total) under the tongue every 5 (five) minutes as needed for chest pain. 25 tablet 11   NOVOLIN N 100 UNIT/ML injection Inject 20-30 Units into the skin See admin instructions. Inject 30 units subcutaneously in the morning & inject 20-30 units subcutaneously at bedtime (depending on blood sugar)     NOVOLIN R 100 UNIT/ML injection Inject 8-20 Units into the skin 3 (three) times daily with meals. Sliding Scale  5   omega-3 acid ethyl esters (LOVAZA) 1 g capsule Take 2 g by mouth daily.     ONETOUCH ULTRA test strip USE 1 STRIP TO CHECK GLUCOSE THREE TIMES DAILY 300 each 0   Probiotic Product (PROBIOTIC DAILY PO) Take 420 mg by mouth daily.     rosuvastatin (CRESTOR) 40 MG tablet Take 1 tablet by mouth once daily 90 tablet 3   Vitamin Mixture (VITAMIN E COMPLETE PO) Take 1 capsule by mouth daily. Vitamin E complex     ZINC SULFATE PO Take 1 tablet by mouth daily.      allopurinol (ZYLOPRIM) 100 MG tablet Take 1 tablet (100 mg total) by mouth daily. 90 tablet 1   cephALEXin (KEFLEX) 500 MG  capsule Take 1 capsule (500 mg total) by mouth daily. 7 capsule 0   fluconazole (DIFLUCAN) 150 MG tablet Take 1 tablet (150 mg total) by mouth daily. 1 tablet 1   fluconazole (DIFLUCAN) 200 MG tablet Take 200 mg by mouth. Once a week.     furosemide (LASIX) 80 MG tablet TAKE 2 TABLETS BY MOUTH TWICE DAILY IN  MORNING  AND  AFTER  LUNCH 360 tablet 0   gabapentin (NEURONTIN) 300 MG capsule Take 1 capsule by mouth once daily 90 capsule 0   hydrALAZINE (APRESOLINE) 100 MG tablet TAKE 1 TABLET BY MOUTH THREE TIMES DAILY 270 tablet 2   lidocaine-prilocaine (EMLA) cream      mupirocin ointment (BACTROBAN) 2 % APPLY TOPICALLY TWICE DAILY. APPLY TO INSIDE OF EACH NARES DAILY FOR 10 DAYS THEN TWICE A WEEK FOR MAINTENANCE. 22 g 0   triamcinolone ointment (KENALOG) 0.5 % Apply 1 application topically 2 (two) times daily. To affected area, avoid eyes and face 30 g 3   Facility-Administered Medications Prior to Visit  Medication Dose Route Frequency Provider Last Rate Last Admin   0.9 %  sodium chloride infusion  500 mL Intravenous Once Gatha Mayer, MD        Allergies  Allergen Reactions   Hydrocodone Nausea And Vomiting    Other reaction(s): GI Upset (intolerance) Projectile vomiting    Oxycodone Nausea And Vomiting    Other reaction(s): GI Upset (intolerance), Vomiting (intolerance) Projectile vomiting    Vancomycin Shortness Of Breath    ORAL VANCOMYCIN for C diff.   Dacarbazine Other (See Comments)    Unknown reaction   Tape Dermatitis,  Itching and Rash    Patch used at dialysis    ROS Review of Systems    Objective:    Physical Exam Constitutional:      Appearance: Normal appearance. He is well-developed.  HENT:     Head: Normocephalic and atraumatic.  Cardiovascular:     Rate and Rhythm: Normal rate and regular rhythm.     Heart sounds: Normal heart sounds.  Pulmonary:     Effort: Pulmonary effort is normal.     Breath sounds: Normal breath sounds.  Skin:    General:  Skin is warm and dry.  Neurological:     Mental Status: He is alert and oriented to person, place, and time. Mental status is at baseline.  Psychiatric:        Behavior: Behavior normal.    BP (!) 116/58    Pulse 82    Resp 18    Ht _0  (1.702 m)    SpO2 96%    BMI 34.46 kg/m  Wt Readings from Last 3 Encounters:  11/28/21 220 lb (99.8 kg)  10/19/21 223 lb 5.2 oz (101.3 kg)  03/14/21 235 lb 6.4 oz (106.8 kg)     Health Maintenance Due  Topic Date Due   HEMOGLOBIN A1C  09/17/2021   OPHTHALMOLOGY EXAM  12/30/2021    There are no preventive care reminders to display for this patient.  Lab Results  Component Value Date   TSH 2.391 02/02/2017   Lab Results  Component Value Date   WBC 11.4 09/09/2020   HGB 12.9 (L) 10/27/2020   HCT 38.0 (L) 10/27/2020   MCV 101 09/09/2020   PLT 234 03/08/2020   Lab Results  Component Value Date   NA 135 10/27/2020   K 4.1 10/27/2020   CO2 27 10/27/2019   GLUCOSE 184 (H) 10/27/2020   BUN 43 (H) 10/27/2020   CREATININE 3.90 (H) 10/27/2020   BILITOT 1.1 12/05/2021   ALKPHOS 105 10/27/2019   AST 16 12/05/2021   ALT 14 12/05/2021   PROT 6.8 12/05/2021   ALBUMIN 2.9 (A) 09/09/2020   CALCIUM 9.2 10/27/2019   ANIONGAP 13 10/27/2019   GFR 38.90 (L) 03/10/2011   Lab Results  Component Value Date   CHOL 83 12/05/2021   Lab Results  Component Value Date   HDL 37 (L) 12/05/2021   Lab Results  Component Value Date   LDLCALC 29 12/05/2021   Lab Results  Component Value Date   TRIG 90 12/05/2021   Lab Results  Component Value Date   CHOLHDL 2.2 12/05/2021   Lab Results  Component Value Date   HGBA1C 6.6 (A) 03/18/2021      Assessment & Plan:   Problem List Items Addressed This Visit       Cardiovascular and Mediastinum   Venous insufficiency (chronic) (peripheral)    Getting some trace pitting edema both lower extremities so I am going to send over a lower dose of furosemide to just use as needed.  He may not need it  at all but just encouraged him and his wife to keep it just in case the swelling suddenly gets worse and they have something overnight or over the weekend.      Relevant Medications   furosemide (LASIX) 40 MG tablet   HYPERTENSION, BENIGN SYSTEMIC    Blood pressure really looks great off of his medications.      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  Glucose logs look fantastic so I think his A1c's, look good.        Other   Gout    No flares in several years.  We will go ahead and stop the allopurinol and see how he does without it.  Can always restart it if flares recur.       Gets eye exam done every 5 weeks.  We will call to get most recent report.  Meds ordered this encounter  Medications   furosemide (LASIX) 40 MG tablet    Sig: Take 1 tablet (40 mg total) by mouth daily as needed.    Dispense:  30 tablet    Refill:  1    Follow-up: Return in about 4 months (around 05/16/2022) for Diabetes follow-up.    [name], MD

## 2022-01-16 NOTE — Assessment & Plan Note (Addendum)
No flares in several years.  We will go ahead and stop the allopurinol and see how he does without it.  Can always restart it if flares recur.

## 2022-01-17 DIAGNOSIS — Z992 Dependence on renal dialysis: Secondary | ICD-10-CM | POA: Diagnosis not present

## 2022-01-17 DIAGNOSIS — D509 Iron deficiency anemia, unspecified: Secondary | ICD-10-CM | POA: Diagnosis not present

## 2022-01-17 DIAGNOSIS — N186 End stage renal disease: Secondary | ICD-10-CM | POA: Diagnosis not present

## 2022-01-17 DIAGNOSIS — E1122 Type 2 diabetes mellitus with diabetic chronic kidney disease: Secondary | ICD-10-CM | POA: Diagnosis not present

## 2022-01-17 DIAGNOSIS — N2581 Secondary hyperparathyroidism of renal origin: Secondary | ICD-10-CM | POA: Diagnosis not present

## 2022-01-18 ENCOUNTER — Ambulatory Visit: Payer: Medicare Other | Admitting: Family Medicine

## 2022-01-18 DIAGNOSIS — Z992 Dependence on renal dialysis: Secondary | ICD-10-CM | POA: Diagnosis not present

## 2022-01-18 DIAGNOSIS — E1122 Type 2 diabetes mellitus with diabetic chronic kidney disease: Secondary | ICD-10-CM | POA: Diagnosis not present

## 2022-01-18 DIAGNOSIS — N186 End stage renal disease: Secondary | ICD-10-CM | POA: Diagnosis not present

## 2022-01-19 DIAGNOSIS — N186 End stage renal disease: Secondary | ICD-10-CM | POA: Diagnosis not present

## 2022-01-19 DIAGNOSIS — Z794 Long term (current) use of insulin: Secondary | ICD-10-CM | POA: Diagnosis not present

## 2022-01-19 DIAGNOSIS — Z961 Presence of intraocular lens: Secondary | ICD-10-CM | POA: Diagnosis not present

## 2022-01-19 DIAGNOSIS — Z992 Dependence on renal dialysis: Secondary | ICD-10-CM | POA: Diagnosis not present

## 2022-01-19 DIAGNOSIS — E1122 Type 2 diabetes mellitus with diabetic chronic kidney disease: Secondary | ICD-10-CM | POA: Diagnosis not present

## 2022-01-19 DIAGNOSIS — H26493 Other secondary cataract, bilateral: Secondary | ICD-10-CM | POA: Diagnosis not present

## 2022-01-19 DIAGNOSIS — E113213 Type 2 diabetes mellitus with mild nonproliferative diabetic retinopathy with macular edema, bilateral: Secondary | ICD-10-CM | POA: Diagnosis not present

## 2022-01-19 DIAGNOSIS — N2581 Secondary hyperparathyroidism of renal origin: Secondary | ICD-10-CM | POA: Diagnosis not present

## 2022-01-20 DIAGNOSIS — Z992 Dependence on renal dialysis: Secondary | ICD-10-CM | POA: Diagnosis not present

## 2022-01-20 DIAGNOSIS — D631 Anemia in chronic kidney disease: Secondary | ICD-10-CM | POA: Diagnosis not present

## 2022-01-20 DIAGNOSIS — E1129 Type 2 diabetes mellitus with other diabetic kidney complication: Secondary | ICD-10-CM | POA: Diagnosis not present

## 2022-01-20 DIAGNOSIS — N186 End stage renal disease: Secondary | ICD-10-CM | POA: Diagnosis not present

## 2022-01-20 DIAGNOSIS — L89623 Pressure ulcer of left heel, stage 3: Secondary | ICD-10-CM | POA: Diagnosis not present

## 2022-01-20 DIAGNOSIS — E1142 Type 2 diabetes mellitus with diabetic polyneuropathy: Secondary | ICD-10-CM | POA: Diagnosis not present

## 2022-01-21 DIAGNOSIS — N186 End stage renal disease: Secondary | ICD-10-CM | POA: Diagnosis not present

## 2022-01-21 DIAGNOSIS — E1122 Type 2 diabetes mellitus with diabetic chronic kidney disease: Secondary | ICD-10-CM | POA: Diagnosis not present

## 2022-01-21 DIAGNOSIS — Z992 Dependence on renal dialysis: Secondary | ICD-10-CM | POA: Diagnosis not present

## 2022-01-21 DIAGNOSIS — N2581 Secondary hyperparathyroidism of renal origin: Secondary | ICD-10-CM | POA: Diagnosis not present

## 2022-01-24 DIAGNOSIS — E1122 Type 2 diabetes mellitus with diabetic chronic kidney disease: Secondary | ICD-10-CM | POA: Diagnosis not present

## 2022-01-24 DIAGNOSIS — N2581 Secondary hyperparathyroidism of renal origin: Secondary | ICD-10-CM | POA: Diagnosis not present

## 2022-01-24 DIAGNOSIS — Z992 Dependence on renal dialysis: Secondary | ICD-10-CM | POA: Diagnosis not present

## 2022-01-24 DIAGNOSIS — N186 End stage renal disease: Secondary | ICD-10-CM | POA: Diagnosis not present

## 2022-01-26 DIAGNOSIS — E1122 Type 2 diabetes mellitus with diabetic chronic kidney disease: Secondary | ICD-10-CM | POA: Diagnosis not present

## 2022-01-26 DIAGNOSIS — N2581 Secondary hyperparathyroidism of renal origin: Secondary | ICD-10-CM | POA: Diagnosis not present

## 2022-01-26 DIAGNOSIS — N186 End stage renal disease: Secondary | ICD-10-CM | POA: Diagnosis not present

## 2022-01-26 DIAGNOSIS — Z992 Dependence on renal dialysis: Secondary | ICD-10-CM | POA: Diagnosis not present

## 2022-01-28 DIAGNOSIS — E1122 Type 2 diabetes mellitus with diabetic chronic kidney disease: Secondary | ICD-10-CM | POA: Diagnosis not present

## 2022-01-28 DIAGNOSIS — M545 Low back pain, unspecified: Secondary | ICD-10-CM | POA: Diagnosis not present

## 2022-01-28 DIAGNOSIS — N2581 Secondary hyperparathyroidism of renal origin: Secondary | ICD-10-CM | POA: Diagnosis not present

## 2022-01-28 DIAGNOSIS — I509 Heart failure, unspecified: Secondary | ICD-10-CM | POA: Diagnosis not present

## 2022-01-28 DIAGNOSIS — R269 Unspecified abnormalities of gait and mobility: Secondary | ICD-10-CM | POA: Diagnosis not present

## 2022-01-28 DIAGNOSIS — D631 Anemia in chronic kidney disease: Secondary | ICD-10-CM | POA: Diagnosis not present

## 2022-01-28 DIAGNOSIS — H52209 Unspecified astigmatism, unspecified eye: Secondary | ICD-10-CM | POA: Diagnosis not present

## 2022-01-28 DIAGNOSIS — Z7901 Long term (current) use of anticoagulants: Secondary | ICD-10-CM | POA: Diagnosis not present

## 2022-01-28 DIAGNOSIS — M6281 Muscle weakness (generalized): Secondary | ICD-10-CM | POA: Diagnosis not present

## 2022-01-28 DIAGNOSIS — E1142 Type 2 diabetes mellitus with diabetic polyneuropathy: Secondary | ICD-10-CM | POA: Diagnosis not present

## 2022-01-28 DIAGNOSIS — E1129 Type 2 diabetes mellitus with other diabetic kidney complication: Secondary | ICD-10-CM | POA: Diagnosis not present

## 2022-01-28 DIAGNOSIS — I482 Chronic atrial fibrillation, unspecified: Secondary | ICD-10-CM | POA: Diagnosis not present

## 2022-01-28 DIAGNOSIS — I110 Hypertensive heart disease with heart failure: Secondary | ICD-10-CM | POA: Diagnosis not present

## 2022-01-28 DIAGNOSIS — Z992 Dependence on renal dialysis: Secondary | ICD-10-CM | POA: Diagnosis not present

## 2022-01-28 DIAGNOSIS — R2681 Unsteadiness on feet: Secondary | ICD-10-CM | POA: Diagnosis not present

## 2022-01-28 DIAGNOSIS — N186 End stage renal disease: Secondary | ICD-10-CM | POA: Diagnosis not present

## 2022-01-28 DIAGNOSIS — I2510 Atherosclerotic heart disease of native coronary artery without angina pectoris: Secondary | ICD-10-CM | POA: Diagnosis not present

## 2022-01-31 DIAGNOSIS — N2581 Secondary hyperparathyroidism of renal origin: Secondary | ICD-10-CM | POA: Diagnosis not present

## 2022-01-31 DIAGNOSIS — E1122 Type 2 diabetes mellitus with diabetic chronic kidney disease: Secondary | ICD-10-CM | POA: Diagnosis not present

## 2022-01-31 DIAGNOSIS — Z992 Dependence on renal dialysis: Secondary | ICD-10-CM | POA: Diagnosis not present

## 2022-01-31 DIAGNOSIS — N186 End stage renal disease: Secondary | ICD-10-CM | POA: Diagnosis not present

## 2022-02-01 DIAGNOSIS — E1129 Type 2 diabetes mellitus with other diabetic kidney complication: Secondary | ICD-10-CM | POA: Diagnosis not present

## 2022-02-01 DIAGNOSIS — R269 Unspecified abnormalities of gait and mobility: Secondary | ICD-10-CM | POA: Diagnosis not present

## 2022-02-01 DIAGNOSIS — N186 End stage renal disease: Secondary | ICD-10-CM | POA: Diagnosis not present

## 2022-02-01 DIAGNOSIS — E1142 Type 2 diabetes mellitus with diabetic polyneuropathy: Secondary | ICD-10-CM | POA: Diagnosis not present

## 2022-02-01 DIAGNOSIS — D631 Anemia in chronic kidney disease: Secondary | ICD-10-CM | POA: Diagnosis not present

## 2022-02-01 DIAGNOSIS — M6281 Muscle weakness (generalized): Secondary | ICD-10-CM | POA: Diagnosis not present

## 2022-02-02 DIAGNOSIS — Z992 Dependence on renal dialysis: Secondary | ICD-10-CM | POA: Diagnosis not present

## 2022-02-02 DIAGNOSIS — Z794 Long term (current) use of insulin: Secondary | ICD-10-CM | POA: Diagnosis not present

## 2022-02-02 DIAGNOSIS — N2581 Secondary hyperparathyroidism of renal origin: Secondary | ICD-10-CM | POA: Diagnosis not present

## 2022-02-02 DIAGNOSIS — Z961 Presence of intraocular lens: Secondary | ICD-10-CM | POA: Diagnosis not present

## 2022-02-02 DIAGNOSIS — E113213 Type 2 diabetes mellitus with mild nonproliferative diabetic retinopathy with macular edema, bilateral: Secondary | ICD-10-CM | POA: Diagnosis not present

## 2022-02-02 DIAGNOSIS — E1122 Type 2 diabetes mellitus with diabetic chronic kidney disease: Secondary | ICD-10-CM | POA: Diagnosis not present

## 2022-02-02 DIAGNOSIS — N186 End stage renal disease: Secondary | ICD-10-CM | POA: Diagnosis not present

## 2022-02-02 DIAGNOSIS — Z8616 Personal history of COVID-19: Secondary | ICD-10-CM | POA: Diagnosis not present

## 2022-02-04 DIAGNOSIS — E1122 Type 2 diabetes mellitus with diabetic chronic kidney disease: Secondary | ICD-10-CM | POA: Diagnosis not present

## 2022-02-04 DIAGNOSIS — Z992 Dependence on renal dialysis: Secondary | ICD-10-CM | POA: Diagnosis not present

## 2022-02-04 DIAGNOSIS — N2581 Secondary hyperparathyroidism of renal origin: Secondary | ICD-10-CM | POA: Diagnosis not present

## 2022-02-04 DIAGNOSIS — N186 End stage renal disease: Secondary | ICD-10-CM | POA: Diagnosis not present

## 2022-02-06 DIAGNOSIS — D631 Anemia in chronic kidney disease: Secondary | ICD-10-CM | POA: Diagnosis not present

## 2022-02-06 DIAGNOSIS — R269 Unspecified abnormalities of gait and mobility: Secondary | ICD-10-CM | POA: Diagnosis not present

## 2022-02-06 DIAGNOSIS — N186 End stage renal disease: Secondary | ICD-10-CM | POA: Diagnosis not present

## 2022-02-06 DIAGNOSIS — E1142 Type 2 diabetes mellitus with diabetic polyneuropathy: Secondary | ICD-10-CM | POA: Diagnosis not present

## 2022-02-06 DIAGNOSIS — E1129 Type 2 diabetes mellitus with other diabetic kidney complication: Secondary | ICD-10-CM | POA: Diagnosis not present

## 2022-02-06 DIAGNOSIS — M6281 Muscle weakness (generalized): Secondary | ICD-10-CM | POA: Diagnosis not present

## 2022-02-07 DIAGNOSIS — E1122 Type 2 diabetes mellitus with diabetic chronic kidney disease: Secondary | ICD-10-CM | POA: Diagnosis not present

## 2022-02-07 DIAGNOSIS — Z992 Dependence on renal dialysis: Secondary | ICD-10-CM | POA: Diagnosis not present

## 2022-02-07 DIAGNOSIS — N186 End stage renal disease: Secondary | ICD-10-CM | POA: Diagnosis not present

## 2022-02-07 DIAGNOSIS — N2581 Secondary hyperparathyroidism of renal origin: Secondary | ICD-10-CM | POA: Diagnosis not present

## 2022-02-09 ENCOUNTER — Other Ambulatory Visit: Payer: Self-pay | Admitting: Cardiology

## 2022-02-09 DIAGNOSIS — N186 End stage renal disease: Secondary | ICD-10-CM | POA: Diagnosis not present

## 2022-02-09 DIAGNOSIS — N2581 Secondary hyperparathyroidism of renal origin: Secondary | ICD-10-CM | POA: Diagnosis not present

## 2022-02-09 DIAGNOSIS — Z992 Dependence on renal dialysis: Secondary | ICD-10-CM | POA: Diagnosis not present

## 2022-02-09 DIAGNOSIS — E1122 Type 2 diabetes mellitus with diabetic chronic kidney disease: Secondary | ICD-10-CM | POA: Diagnosis not present

## 2022-02-10 DIAGNOSIS — E1129 Type 2 diabetes mellitus with other diabetic kidney complication: Secondary | ICD-10-CM | POA: Diagnosis not present

## 2022-02-10 DIAGNOSIS — M6281 Muscle weakness (generalized): Secondary | ICD-10-CM | POA: Diagnosis not present

## 2022-02-10 DIAGNOSIS — E1142 Type 2 diabetes mellitus with diabetic polyneuropathy: Secondary | ICD-10-CM | POA: Diagnosis not present

## 2022-02-10 DIAGNOSIS — N186 End stage renal disease: Secondary | ICD-10-CM | POA: Diagnosis not present

## 2022-02-10 DIAGNOSIS — R269 Unspecified abnormalities of gait and mobility: Secondary | ICD-10-CM | POA: Diagnosis not present

## 2022-02-10 DIAGNOSIS — D631 Anemia in chronic kidney disease: Secondary | ICD-10-CM | POA: Diagnosis not present

## 2022-02-11 DIAGNOSIS — N2581 Secondary hyperparathyroidism of renal origin: Secondary | ICD-10-CM | POA: Diagnosis not present

## 2022-02-11 DIAGNOSIS — E1122 Type 2 diabetes mellitus with diabetic chronic kidney disease: Secondary | ICD-10-CM | POA: Diagnosis not present

## 2022-02-11 DIAGNOSIS — Z992 Dependence on renal dialysis: Secondary | ICD-10-CM | POA: Diagnosis not present

## 2022-02-11 DIAGNOSIS — N186 End stage renal disease: Secondary | ICD-10-CM | POA: Diagnosis not present

## 2022-02-14 DIAGNOSIS — N186 End stage renal disease: Secondary | ICD-10-CM | POA: Diagnosis not present

## 2022-02-14 DIAGNOSIS — E1122 Type 2 diabetes mellitus with diabetic chronic kidney disease: Secondary | ICD-10-CM | POA: Diagnosis not present

## 2022-02-14 DIAGNOSIS — Z20822 Contact with and (suspected) exposure to covid-19: Secondary | ICD-10-CM | POA: Diagnosis not present

## 2022-02-14 DIAGNOSIS — N2581 Secondary hyperparathyroidism of renal origin: Secondary | ICD-10-CM | POA: Diagnosis not present

## 2022-02-14 DIAGNOSIS — Z992 Dependence on renal dialysis: Secondary | ICD-10-CM | POA: Diagnosis not present

## 2022-02-16 DIAGNOSIS — Z992 Dependence on renal dialysis: Secondary | ICD-10-CM | POA: Diagnosis not present

## 2022-02-16 DIAGNOSIS — N2581 Secondary hyperparathyroidism of renal origin: Secondary | ICD-10-CM | POA: Diagnosis not present

## 2022-02-16 DIAGNOSIS — E1122 Type 2 diabetes mellitus with diabetic chronic kidney disease: Secondary | ICD-10-CM | POA: Diagnosis not present

## 2022-02-16 DIAGNOSIS — N186 End stage renal disease: Secondary | ICD-10-CM | POA: Diagnosis not present

## 2022-02-17 DIAGNOSIS — R269 Unspecified abnormalities of gait and mobility: Secondary | ICD-10-CM | POA: Diagnosis not present

## 2022-02-17 DIAGNOSIS — E1142 Type 2 diabetes mellitus with diabetic polyneuropathy: Secondary | ICD-10-CM | POA: Diagnosis not present

## 2022-02-17 DIAGNOSIS — M6281 Muscle weakness (generalized): Secondary | ICD-10-CM | POA: Diagnosis not present

## 2022-02-17 DIAGNOSIS — N186 End stage renal disease: Secondary | ICD-10-CM | POA: Diagnosis not present

## 2022-02-17 DIAGNOSIS — E1129 Type 2 diabetes mellitus with other diabetic kidney complication: Secondary | ICD-10-CM | POA: Diagnosis not present

## 2022-02-17 DIAGNOSIS — D631 Anemia in chronic kidney disease: Secondary | ICD-10-CM | POA: Diagnosis not present

## 2022-02-18 DIAGNOSIS — D509 Iron deficiency anemia, unspecified: Secondary | ICD-10-CM | POA: Diagnosis not present

## 2022-02-18 DIAGNOSIS — N2581 Secondary hyperparathyroidism of renal origin: Secondary | ICD-10-CM | POA: Diagnosis not present

## 2022-02-18 DIAGNOSIS — Z992 Dependence on renal dialysis: Secondary | ICD-10-CM | POA: Diagnosis not present

## 2022-02-18 DIAGNOSIS — E1122 Type 2 diabetes mellitus with diabetic chronic kidney disease: Secondary | ICD-10-CM | POA: Diagnosis not present

## 2022-02-18 DIAGNOSIS — N186 End stage renal disease: Secondary | ICD-10-CM | POA: Diagnosis not present

## 2022-02-21 DIAGNOSIS — E1122 Type 2 diabetes mellitus with diabetic chronic kidney disease: Secondary | ICD-10-CM | POA: Diagnosis not present

## 2022-02-21 DIAGNOSIS — Z992 Dependence on renal dialysis: Secondary | ICD-10-CM | POA: Diagnosis not present

## 2022-02-21 DIAGNOSIS — D509 Iron deficiency anemia, unspecified: Secondary | ICD-10-CM | POA: Diagnosis not present

## 2022-02-21 DIAGNOSIS — N2581 Secondary hyperparathyroidism of renal origin: Secondary | ICD-10-CM | POA: Diagnosis not present

## 2022-02-21 DIAGNOSIS — N186 End stage renal disease: Secondary | ICD-10-CM | POA: Diagnosis not present

## 2022-02-23 DIAGNOSIS — Z992 Dependence on renal dialysis: Secondary | ICD-10-CM | POA: Diagnosis not present

## 2022-02-23 DIAGNOSIS — N2581 Secondary hyperparathyroidism of renal origin: Secondary | ICD-10-CM | POA: Diagnosis not present

## 2022-02-23 DIAGNOSIS — E1122 Type 2 diabetes mellitus with diabetic chronic kidney disease: Secondary | ICD-10-CM | POA: Diagnosis not present

## 2022-02-23 DIAGNOSIS — D509 Iron deficiency anemia, unspecified: Secondary | ICD-10-CM | POA: Diagnosis not present

## 2022-02-23 DIAGNOSIS — N186 End stage renal disease: Secondary | ICD-10-CM | POA: Diagnosis not present

## 2022-02-24 DIAGNOSIS — M6281 Muscle weakness (generalized): Secondary | ICD-10-CM | POA: Diagnosis not present

## 2022-02-24 DIAGNOSIS — D631 Anemia in chronic kidney disease: Secondary | ICD-10-CM | POA: Diagnosis not present

## 2022-02-24 DIAGNOSIS — R269 Unspecified abnormalities of gait and mobility: Secondary | ICD-10-CM | POA: Diagnosis not present

## 2022-02-24 DIAGNOSIS — E1129 Type 2 diabetes mellitus with other diabetic kidney complication: Secondary | ICD-10-CM | POA: Diagnosis not present

## 2022-02-24 DIAGNOSIS — E1142 Type 2 diabetes mellitus with diabetic polyneuropathy: Secondary | ICD-10-CM | POA: Diagnosis not present

## 2022-02-24 DIAGNOSIS — N186 End stage renal disease: Secondary | ICD-10-CM | POA: Diagnosis not present

## 2022-02-25 DIAGNOSIS — Z992 Dependence on renal dialysis: Secondary | ICD-10-CM | POA: Diagnosis not present

## 2022-02-25 DIAGNOSIS — D509 Iron deficiency anemia, unspecified: Secondary | ICD-10-CM | POA: Diagnosis not present

## 2022-02-25 DIAGNOSIS — N2581 Secondary hyperparathyroidism of renal origin: Secondary | ICD-10-CM | POA: Diagnosis not present

## 2022-02-25 DIAGNOSIS — N186 End stage renal disease: Secondary | ICD-10-CM | POA: Diagnosis not present

## 2022-02-25 DIAGNOSIS — E1122 Type 2 diabetes mellitus with diabetic chronic kidney disease: Secondary | ICD-10-CM | POA: Diagnosis not present

## 2022-02-27 DIAGNOSIS — M545 Low back pain, unspecified: Secondary | ICD-10-CM | POA: Diagnosis not present

## 2022-02-27 DIAGNOSIS — E1142 Type 2 diabetes mellitus with diabetic polyneuropathy: Secondary | ICD-10-CM | POA: Diagnosis not present

## 2022-02-27 DIAGNOSIS — R2681 Unsteadiness on feet: Secondary | ICD-10-CM | POA: Diagnosis not present

## 2022-02-27 DIAGNOSIS — R269 Unspecified abnormalities of gait and mobility: Secondary | ICD-10-CM | POA: Diagnosis not present

## 2022-02-27 DIAGNOSIS — D631 Anemia in chronic kidney disease: Secondary | ICD-10-CM | POA: Diagnosis not present

## 2022-02-27 DIAGNOSIS — I2510 Atherosclerotic heart disease of native coronary artery without angina pectoris: Secondary | ICD-10-CM | POA: Diagnosis not present

## 2022-02-27 DIAGNOSIS — I509 Heart failure, unspecified: Secondary | ICD-10-CM | POA: Diagnosis not present

## 2022-02-27 DIAGNOSIS — E1129 Type 2 diabetes mellitus with other diabetic kidney complication: Secondary | ICD-10-CM | POA: Diagnosis not present

## 2022-02-27 DIAGNOSIS — I110 Hypertensive heart disease with heart failure: Secondary | ICD-10-CM | POA: Diagnosis not present

## 2022-02-27 DIAGNOSIS — I482 Chronic atrial fibrillation, unspecified: Secondary | ICD-10-CM | POA: Diagnosis not present

## 2022-02-27 DIAGNOSIS — Z7901 Long term (current) use of anticoagulants: Secondary | ICD-10-CM | POA: Diagnosis not present

## 2022-02-27 DIAGNOSIS — N186 End stage renal disease: Secondary | ICD-10-CM | POA: Diagnosis not present

## 2022-02-27 DIAGNOSIS — M6281 Muscle weakness (generalized): Secondary | ICD-10-CM | POA: Diagnosis not present

## 2022-02-27 DIAGNOSIS — Z992 Dependence on renal dialysis: Secondary | ICD-10-CM | POA: Diagnosis not present

## 2022-02-27 DIAGNOSIS — H52209 Unspecified astigmatism, unspecified eye: Secondary | ICD-10-CM | POA: Diagnosis not present

## 2022-02-28 DIAGNOSIS — N186 End stage renal disease: Secondary | ICD-10-CM | POA: Diagnosis not present

## 2022-02-28 DIAGNOSIS — E1122 Type 2 diabetes mellitus with diabetic chronic kidney disease: Secondary | ICD-10-CM | POA: Diagnosis not present

## 2022-02-28 DIAGNOSIS — Z992 Dependence on renal dialysis: Secondary | ICD-10-CM | POA: Diagnosis not present

## 2022-02-28 DIAGNOSIS — N2581 Secondary hyperparathyroidism of renal origin: Secondary | ICD-10-CM | POA: Diagnosis not present

## 2022-02-28 DIAGNOSIS — D509 Iron deficiency anemia, unspecified: Secondary | ICD-10-CM | POA: Diagnosis not present

## 2022-03-02 DIAGNOSIS — N2581 Secondary hyperparathyroidism of renal origin: Secondary | ICD-10-CM | POA: Diagnosis not present

## 2022-03-02 DIAGNOSIS — Z992 Dependence on renal dialysis: Secondary | ICD-10-CM | POA: Diagnosis not present

## 2022-03-02 DIAGNOSIS — N186 End stage renal disease: Secondary | ICD-10-CM | POA: Diagnosis not present

## 2022-03-02 DIAGNOSIS — E1122 Type 2 diabetes mellitus with diabetic chronic kidney disease: Secondary | ICD-10-CM | POA: Diagnosis not present

## 2022-03-02 DIAGNOSIS — E113212 Type 2 diabetes mellitus with mild nonproliferative diabetic retinopathy with macular edema, left eye: Secondary | ICD-10-CM | POA: Diagnosis not present

## 2022-03-02 DIAGNOSIS — D509 Iron deficiency anemia, unspecified: Secondary | ICD-10-CM | POA: Diagnosis not present

## 2022-03-03 DIAGNOSIS — N186 End stage renal disease: Secondary | ICD-10-CM | POA: Diagnosis not present

## 2022-03-03 DIAGNOSIS — D631 Anemia in chronic kidney disease: Secondary | ICD-10-CM | POA: Diagnosis not present

## 2022-03-03 DIAGNOSIS — E1129 Type 2 diabetes mellitus with other diabetic kidney complication: Secondary | ICD-10-CM | POA: Diagnosis not present

## 2022-03-03 DIAGNOSIS — E1142 Type 2 diabetes mellitus with diabetic polyneuropathy: Secondary | ICD-10-CM | POA: Diagnosis not present

## 2022-03-03 DIAGNOSIS — R269 Unspecified abnormalities of gait and mobility: Secondary | ICD-10-CM | POA: Diagnosis not present

## 2022-03-03 DIAGNOSIS — M6281 Muscle weakness (generalized): Secondary | ICD-10-CM | POA: Diagnosis not present

## 2022-03-04 DIAGNOSIS — D509 Iron deficiency anemia, unspecified: Secondary | ICD-10-CM | POA: Diagnosis not present

## 2022-03-04 DIAGNOSIS — Z992 Dependence on renal dialysis: Secondary | ICD-10-CM | POA: Diagnosis not present

## 2022-03-04 DIAGNOSIS — N186 End stage renal disease: Secondary | ICD-10-CM | POA: Diagnosis not present

## 2022-03-04 DIAGNOSIS — N2581 Secondary hyperparathyroidism of renal origin: Secondary | ICD-10-CM | POA: Diagnosis not present

## 2022-03-04 DIAGNOSIS — E1122 Type 2 diabetes mellitus with diabetic chronic kidney disease: Secondary | ICD-10-CM | POA: Diagnosis not present

## 2022-03-07 DIAGNOSIS — N2581 Secondary hyperparathyroidism of renal origin: Secondary | ICD-10-CM | POA: Diagnosis not present

## 2022-03-07 DIAGNOSIS — E1122 Type 2 diabetes mellitus with diabetic chronic kidney disease: Secondary | ICD-10-CM | POA: Diagnosis not present

## 2022-03-07 DIAGNOSIS — N186 End stage renal disease: Secondary | ICD-10-CM | POA: Diagnosis not present

## 2022-03-07 DIAGNOSIS — D509 Iron deficiency anemia, unspecified: Secondary | ICD-10-CM | POA: Diagnosis not present

## 2022-03-07 DIAGNOSIS — Z992 Dependence on renal dialysis: Secondary | ICD-10-CM | POA: Diagnosis not present

## 2022-03-09 DIAGNOSIS — E1122 Type 2 diabetes mellitus with diabetic chronic kidney disease: Secondary | ICD-10-CM | POA: Diagnosis not present

## 2022-03-09 DIAGNOSIS — Z992 Dependence on renal dialysis: Secondary | ICD-10-CM | POA: Diagnosis not present

## 2022-03-09 DIAGNOSIS — N186 End stage renal disease: Secondary | ICD-10-CM | POA: Diagnosis not present

## 2022-03-09 DIAGNOSIS — D509 Iron deficiency anemia, unspecified: Secondary | ICD-10-CM | POA: Diagnosis not present

## 2022-03-09 DIAGNOSIS — N2581 Secondary hyperparathyroidism of renal origin: Secondary | ICD-10-CM | POA: Diagnosis not present

## 2022-03-09 DIAGNOSIS — Z20822 Contact with and (suspected) exposure to covid-19: Secondary | ICD-10-CM | POA: Diagnosis not present

## 2022-03-10 DIAGNOSIS — E1129 Type 2 diabetes mellitus with other diabetic kidney complication: Secondary | ICD-10-CM | POA: Diagnosis not present

## 2022-03-10 DIAGNOSIS — R269 Unspecified abnormalities of gait and mobility: Secondary | ICD-10-CM | POA: Diagnosis not present

## 2022-03-10 DIAGNOSIS — M6281 Muscle weakness (generalized): Secondary | ICD-10-CM | POA: Diagnosis not present

## 2022-03-10 DIAGNOSIS — D631 Anemia in chronic kidney disease: Secondary | ICD-10-CM | POA: Diagnosis not present

## 2022-03-10 DIAGNOSIS — N186 End stage renal disease: Secondary | ICD-10-CM | POA: Diagnosis not present

## 2022-03-10 DIAGNOSIS — E1142 Type 2 diabetes mellitus with diabetic polyneuropathy: Secondary | ICD-10-CM | POA: Diagnosis not present

## 2022-03-11 DIAGNOSIS — E1122 Type 2 diabetes mellitus with diabetic chronic kidney disease: Secondary | ICD-10-CM | POA: Diagnosis not present

## 2022-03-11 DIAGNOSIS — N186 End stage renal disease: Secondary | ICD-10-CM | POA: Diagnosis not present

## 2022-03-11 DIAGNOSIS — D509 Iron deficiency anemia, unspecified: Secondary | ICD-10-CM | POA: Diagnosis not present

## 2022-03-11 DIAGNOSIS — N2581 Secondary hyperparathyroidism of renal origin: Secondary | ICD-10-CM | POA: Diagnosis not present

## 2022-03-11 DIAGNOSIS — Z992 Dependence on renal dialysis: Secondary | ICD-10-CM | POA: Diagnosis not present

## 2022-03-12 LAB — COMPREHENSIVE METABOLIC PANEL WITH GFR: Albumin: 3.7 (ref 3.5–5.0)

## 2022-03-12 LAB — BASIC METABOLIC PANEL: Glucose: 145

## 2022-03-12 LAB — HEMOGLOBIN A1C: Hemoglobin A1C: 6.2

## 2022-03-14 DIAGNOSIS — E1122 Type 2 diabetes mellitus with diabetic chronic kidney disease: Secondary | ICD-10-CM | POA: Diagnosis not present

## 2022-03-14 DIAGNOSIS — N186 End stage renal disease: Secondary | ICD-10-CM | POA: Diagnosis not present

## 2022-03-14 DIAGNOSIS — N2581 Secondary hyperparathyroidism of renal origin: Secondary | ICD-10-CM | POA: Diagnosis not present

## 2022-03-14 DIAGNOSIS — D509 Iron deficiency anemia, unspecified: Secondary | ICD-10-CM | POA: Diagnosis not present

## 2022-03-14 DIAGNOSIS — Z992 Dependence on renal dialysis: Secondary | ICD-10-CM | POA: Diagnosis not present

## 2022-03-16 DIAGNOSIS — Z992 Dependence on renal dialysis: Secondary | ICD-10-CM | POA: Diagnosis not present

## 2022-03-16 DIAGNOSIS — N2581 Secondary hyperparathyroidism of renal origin: Secondary | ICD-10-CM | POA: Diagnosis not present

## 2022-03-16 DIAGNOSIS — N186 End stage renal disease: Secondary | ICD-10-CM | POA: Diagnosis not present

## 2022-03-16 DIAGNOSIS — D509 Iron deficiency anemia, unspecified: Secondary | ICD-10-CM | POA: Diagnosis not present

## 2022-03-16 DIAGNOSIS — E1122 Type 2 diabetes mellitus with diabetic chronic kidney disease: Secondary | ICD-10-CM | POA: Diagnosis not present

## 2022-03-17 DIAGNOSIS — R269 Unspecified abnormalities of gait and mobility: Secondary | ICD-10-CM | POA: Diagnosis not present

## 2022-03-17 DIAGNOSIS — E1129 Type 2 diabetes mellitus with other diabetic kidney complication: Secondary | ICD-10-CM | POA: Diagnosis not present

## 2022-03-17 DIAGNOSIS — M6281 Muscle weakness (generalized): Secondary | ICD-10-CM | POA: Diagnosis not present

## 2022-03-17 DIAGNOSIS — E1142 Type 2 diabetes mellitus with diabetic polyneuropathy: Secondary | ICD-10-CM | POA: Diagnosis not present

## 2022-03-17 DIAGNOSIS — N186 End stage renal disease: Secondary | ICD-10-CM | POA: Diagnosis not present

## 2022-03-17 DIAGNOSIS — D631 Anemia in chronic kidney disease: Secondary | ICD-10-CM | POA: Diagnosis not present

## 2022-03-18 DIAGNOSIS — N2581 Secondary hyperparathyroidism of renal origin: Secondary | ICD-10-CM | POA: Diagnosis not present

## 2022-03-18 DIAGNOSIS — D509 Iron deficiency anemia, unspecified: Secondary | ICD-10-CM | POA: Diagnosis not present

## 2022-03-18 DIAGNOSIS — N186 End stage renal disease: Secondary | ICD-10-CM | POA: Diagnosis not present

## 2022-03-18 DIAGNOSIS — E1122 Type 2 diabetes mellitus with diabetic chronic kidney disease: Secondary | ICD-10-CM | POA: Diagnosis not present

## 2022-03-18 DIAGNOSIS — Z992 Dependence on renal dialysis: Secondary | ICD-10-CM | POA: Diagnosis not present

## 2022-03-20 DIAGNOSIS — N186 End stage renal disease: Secondary | ICD-10-CM | POA: Diagnosis not present

## 2022-03-20 DIAGNOSIS — Z992 Dependence on renal dialysis: Secondary | ICD-10-CM | POA: Diagnosis not present

## 2022-03-20 DIAGNOSIS — E1122 Type 2 diabetes mellitus with diabetic chronic kidney disease: Secondary | ICD-10-CM | POA: Diagnosis not present

## 2022-03-21 DIAGNOSIS — N186 End stage renal disease: Secondary | ICD-10-CM | POA: Diagnosis not present

## 2022-03-21 DIAGNOSIS — E1122 Type 2 diabetes mellitus with diabetic chronic kidney disease: Secondary | ICD-10-CM | POA: Diagnosis not present

## 2022-03-21 DIAGNOSIS — Z992 Dependence on renal dialysis: Secondary | ICD-10-CM | POA: Diagnosis not present

## 2022-03-21 DIAGNOSIS — D509 Iron deficiency anemia, unspecified: Secondary | ICD-10-CM | POA: Diagnosis not present

## 2022-03-21 DIAGNOSIS — N2581 Secondary hyperparathyroidism of renal origin: Secondary | ICD-10-CM | POA: Diagnosis not present

## 2022-03-23 DIAGNOSIS — N186 End stage renal disease: Secondary | ICD-10-CM | POA: Diagnosis not present

## 2022-03-23 DIAGNOSIS — Z992 Dependence on renal dialysis: Secondary | ICD-10-CM | POA: Diagnosis not present

## 2022-03-23 DIAGNOSIS — N2581 Secondary hyperparathyroidism of renal origin: Secondary | ICD-10-CM | POA: Diagnosis not present

## 2022-03-23 DIAGNOSIS — D509 Iron deficiency anemia, unspecified: Secondary | ICD-10-CM | POA: Diagnosis not present

## 2022-03-23 DIAGNOSIS — E1122 Type 2 diabetes mellitus with diabetic chronic kidney disease: Secondary | ICD-10-CM | POA: Diagnosis not present

## 2022-03-25 DIAGNOSIS — Z992 Dependence on renal dialysis: Secondary | ICD-10-CM | POA: Diagnosis not present

## 2022-03-25 DIAGNOSIS — E1122 Type 2 diabetes mellitus with diabetic chronic kidney disease: Secondary | ICD-10-CM | POA: Diagnosis not present

## 2022-03-25 DIAGNOSIS — D509 Iron deficiency anemia, unspecified: Secondary | ICD-10-CM | POA: Diagnosis not present

## 2022-03-25 DIAGNOSIS — N2581 Secondary hyperparathyroidism of renal origin: Secondary | ICD-10-CM | POA: Diagnosis not present

## 2022-03-25 DIAGNOSIS — N186 End stage renal disease: Secondary | ICD-10-CM | POA: Diagnosis not present

## 2022-03-27 ENCOUNTER — Ambulatory Visit (INDEPENDENT_AMBULATORY_CARE_PROVIDER_SITE_OTHER): Payer: Medicare Other | Admitting: Family Medicine

## 2022-03-27 VITALS — BP 118/58 | HR 48 | Temp 94.8°F | Ht 67.0 in | Wt 219.5 lb

## 2022-03-27 DIAGNOSIS — Z0000 Encounter for general adult medical examination without abnormal findings: Secondary | ICD-10-CM

## 2022-03-27 NOTE — Progress Notes (Signed)
? ? ?[place] VISIT ? ?03/27/2022 ? ?Telephone Visit Disclaimer ?This Medicare AWV was conducted by telephone due to national recommendations for restrictions regarding the COVID-19 Pandemic (e.g. social distancing).  I verified, using two identifiers, that I am speaking with Louis [name] or their authorized healthcare agent. I discussed the limitations, risks, security, and privacy concerns of performing an evaluation and management service by telephone and the potential availability of an in-person appointment in the future. The patient expressed understanding and agreed to proceed.  ?Location of Patient: Home ?Location of Provider (nurse):  In the office ? ?Subjective:  ? ? ?Louis [name] is a 76 y.o. male patient of [name], Louis Kocher, MD who had a Medicare Annual Wellness Visit today via telephone. Louis [name] is Retired and lives with their spouse. he has 5 children. he reports that he is socially active and does interact with friends/family regularly. he is minimally physically active and enjoys watching sports as he is not able to do much due to neuropathy. ? ?Patient Care Team: ?[name], MD as PCP - General ?Lelon Perla, MD as PCP - Cardiology (Cardiology) ?Lonna Duval, MD as Referring Physician (Endocrinology) ?Gerarda Fraction, MD as Referring Physician (Ophthalmology) ?Lyndee Hensen (Chiropractic Medicine) ?Justin Mend, MD as Consulting Physician (Nephrology) ?[facility] [place] ?Darius Bump, [facility] as Pharmacist (Pharmacist) ? ? ?  03/27/2022  ?  1:56 PM 03/14/2021  ?  9:21 AM 01/23/2019  ?  3:36 PM 12/14/2018  ?  4:27 PM 09/03/2018  ?  8:21 AM 02/02/2017  ? 11:44 AM 01/25/2017  ?  8:04 AM  ?Advanced Directives  ?Does Patient Have a Medical Advance Directive? _0  No No  ?Would patient like information on creating a medical advance directive? No - Patient declined No - Patient declined No - Patient declined No - Patient declined Yes  (MAU/Ambulatory/Procedural Areas - Information given) No - Patient declined   ? ? ?Hospital Utilization Over the Past 12 Months: ?# of hospitalizations or ER visits: 3 ?# of surgeries: 0 ? ?Review of Systems    ?Patient reports that his overall health is better compared to last year. ? ?History obtained from chart review and the patient ? ?Patient Reported Readings (BP, Pulse, CBG, Weight, etc) ?Weight: 219.5 lb ?Temperature: 94.8 F ?SPO2Sat: 100% ?Pulse: 48 ?BP: 118/58 ? ?Pain Assessment ?Pain : No/denies pain ? ?  ? ?Current Medications & Allergies (verified) ?Allergies as of 03/27/2022   ? ?   Reactions  ? Hydrocodone Nausea And Vomiting  ? Other reaction(s): GI Upset (intolerance) ?Projectile vomiting  ? Oxycodone Nausea And Vomiting  ? Other reaction(s): GI Upset (intolerance), Vomiting (intolerance) ?Projectile vomiting  ? Vancomycin Shortness Of Breath  ? ORAL VANCOMYCIN for C diff.  ? Dacarbazine Other (See Comments)  ? Unknown reaction  ? Tape Dermatitis, Itching, Rash  ? Patch used at dialysis  ? ?  ? ?  ?Medication List  ?  ? ?  ? Accurate as of Mar 27, 2022  1:58 PM. If you have any questions, ask your nurse or doctor.  ?  ?  ? ?  ? ?AMBULATORY NON FORMULARY MEDICATION ?Continuous positive airway pressure (CPAP) device: Auto titrate minimum 4 cm H20 to maximum of 20 cm H2O with pressure. Please provide all supplemental supplies as needed. Fax to: [phone] ?  ?AMBULATORY NON FORMULARY MEDICATION ?CPAP supplies - Disposable filters (white), Replaceable pillow or cushions, Mask and Tubing as needed. ?  ?B-D  INS SYR ULTRAFINE 1CC/31G 31G X 5/16" 1 ML Misc ?Generic drug: Insulin Syringe-Needle U-100 ?  ?blood glucose meter kit and supplies ?Use to check blood sugars daily E11.65 ?  ?CoQ10 200 MG Caps ?Take 200 mg by mouth daily. ?  ?Eliquis 5 MG Tabs tablet ?Generic drug: apixaban ?Take 1 tablet (5 mg total) by mouth 2 (two) times daily. ?  ?FreeStyle Libre 14 Day Reader Kerrin Mo ?Dx DM E11.22 Check blood sugar  4 times daily. ?  ?FreeStyle Libre 14 Day Sensor Misc ?Dx DM E11.22 Check blood sugar 4 times daily. ?  ?furosemide 40 MG tablet ?Commonly known as: LASIX ?Take 1 tablet (40 mg total) by mouth daily as needed. ?  ?isosorbide dinitrate 20 MG tablet ?Commonly known as: ISORDIL ?Take 1 tablet by mouth twice daily ?  ?nitroGLYCERIN 0.4 MG SL tablet ?Commonly known as: NITROSTAT ?Place 1 tablet (0.4 mg total) under the tongue every 5 (five) minutes as needed for chest pain. ?  ?NovoLIN N 100 UNIT/ML injection ?Generic drug: insulin NPH Human ?Inject 20-30 Units into the skin See admin instructions. Inject 30 units subcutaneously in the morning & inject 20-30 units subcutaneously at bedtime (depending on blood sugar) ?  ?NovoLIN R 100 units/mL injection ?Generic drug: insulin regular ?Inject 8-20 Units into the skin 3 (three) times daily with meals. Sliding Scale ?  ?omega-3 acid ethyl esters 1 g capsule ?Commonly known as: LOVAZA ?Take 2 g by mouth daily. ?  ?OneTouch Ultra test strip ?Generic drug: glucose blood ?USE 1 STRIP TO CHECK GLUCOSE THREE TIMES DAILY ?  ?PROBIOTIC DAILY PO ?Take 420 mg by mouth daily. ?  ?rosuvastatin 40 MG tablet ?Commonly known as: CRESTOR ?Take 1 tablet by mouth once daily ?  ?silver sulfADIAZINE 1 % cream ?Commonly known as: SILVADENE ?Apply topically. ?  ?Vitamin D3 50 MCG (2000 UT) Tabs ?Take 2,000 Units by mouth daily. ?  ?VITAMIN E COMPLETE PO ?Take 1 capsule by mouth daily. Vitamin E complex ?  ?ZINC SULFATE PO ?Take 1 tablet by mouth daily. ?  ? ?  ? ? ?History (reviewed): ?Past Medical History:  ?Diagnosis Date  ? Arthritis   ? Brittle bones   ? per pt, has soft bones in right foot/wears boot cast!  ? CAD (coronary artery disease)   ? Cancer [facility])   ? skin cancer on arm  ? Cataract   ? Bil/ surg scheduled for right eye 01/18/17/ left eye 02/08/17  ? Charcot ankle, right 2019  ? CHF (congestive heart failure) ([place]) 2015  ? Diabetes mellitus   ? Type 2  ? Heart failure, diastolic ([place])    ? History of kidney stones   ? Hyperlipidemia   ? Hypertension   ? Macular degeneration disease   ? Macular edema 2014  ? OSA on CPAP   ? Paroxysmal atrial fibrillation ([facility])   ? Personal history of colonic polyps - adenomas 01/28/2014  ? Renal insufficiency   ? Tu/Th/Sa Dialysis  ? Shortness of breath dyspnea   ? Syncope and collapse   ? ?Past Surgical History:  ?Procedure Laterality Date  ? AV FISTULA PLACEMENT Left 09/01/2020  ? Procedure: LEFT ARM ARTERIOVENOUS (AV) FISTULA;  Surgeon: Waynetta Sandy, MD;  Location: [place];  Service: Vascular;  Laterality: Left;  ? BASCILIC VEIN TRANSPOSITION Left 10/27/2020  ? Procedure: LEFT SECOND STAGE BASILIC VEIN FISTULA TRANSPOSITION;  Surgeon: Waynetta Sandy, MD;  Location: [place];  Service: Vascular;  Laterality: Left;  ? CARPAL TUNNEL  RELEASE    ? left hand  ? COLONOSCOPY    ? INTRAVASCULAR PRESSURE WIRE/FFR STUDY N/A 12/18/2018  ? Procedure: INTRAVASCULAR PRESSURE WIRE/FFR STUDY;  Surgeon: Wellington Hampshire, MD;  Location: [place] [facility];  Service: Cardiovascular;  Laterality: N/A;  ? IR FLUORO GUIDE CV LINE RIGHT  12/16/2018  ? IR US GUIDE VASC ACCESS RIGHT  12/16/2018  ? KNEE ARTHROSCOPY Right 09/13/2016  ? [facility], Dr. Dorna Leitz  ? PILONIDAL CYST EXCISION    ? RIGHT/LEFT HEART CATH AND CORONARY ANGIOGRAPHY N/A 12/18/2018  ? Procedure: RIGHT/LEFT HEART CATH AND CORONARY ANGIOGRAPHY;  Surgeon: Wellington Hampshire, MD;  Location: [place] [facility];  Service: Cardiovascular;  Laterality: N/A;  ? ?Family History  ?Problem Relation Age of Onset  ? Diabetes Mother   ? Kidney disease Mother   ? Diabetes Father   ? Coronary artery disease Father   ? Diabetes Brother   ? Diabetes Sister   ? Prostate cancer Maternal Uncle   ? Diabetes Maternal Grandmother   ? Cancer Paternal Grandmother   ?     unknown  ? Heart attack Paternal Grandfather   ? Colon cancer Neg Hx   ? ?Social History  ? ?Socioeconomic History  ? Marital status: Married  ?  Spouse  name: [name]  ? Number of children: 5  ? Years of education: 66  ? Highest education level: 12th grade  ?Occupational History  ? Occupation: Warehouse  ?  Comment: retired  ?Tobacco Use  ? Smoking status: Former  ?  Pac

## 2022-03-27 NOTE — Patient Instructions (Addendum)
?MEDICARE ANNUAL WELLNESS VISIT ?Health Maintenance Summary and Written Plan of Care ? ?[name] , ? ?Thank you for allowing me to perform your Medicare Annual Wellness Visit and for your ongoing commitment to your health.  ? ?Health Maintenance & Immunization History ?Health Maintenance  ?Topic Date Due  ? HEMOGLOBIN A1C  03/28/2022 (Originally 09/17/2021)  ? COVID-19 Vaccine (1) 04/12/2022 (Originally 04/27/1947)  ? Zoster Vaccines- Shingrix (1 of 2) 06/27/2022 (Originally 10/27/1965)  ? INFLUENZA VACCINE  06/20/2022  ? OPHTHALMOLOGY EXAM  12/22/2022  ? TETANUS/TDAP  02/16/2026  ? Pneumonia Vaccine 38+ Years old  Completed  ? Hepatitis C Screening  Completed  ? HPV VACCINES  Aged Out  ? COLONOSCOPY (Pts 45-45yr Insurance coverage will need to be confirmed)  Discontinued  ? ?Immunization History  ?Administered Date(s) Administered  ? Hep A / Hep B 11/21/2017, 12/24/2017, 04/29/2019  ? Hepb-cpg 09/14/2020, 10/07/2020, 11/04/2020, 03/10/2021  ? Influenza Split 08/29/2012  ? Influenza Whole 10/16/2005, 09/19/2007  ? Influenza,inj,Quad PF,6+ Mos 07/31/2013, 10/13/2014, 08/12/2015  ? Pneumococcal Conjugate-13 04/27/2015, 03/12/2019  ? Pneumococcal Polysaccharide-23 08/10/2006, 12/12/2016, 06/18/2019  ? Td 10/16/2005  ? Tdap 02/17/2016  ? ? ?These are the patient goals that we discussed: ? Goals Addressed   ? ?  ?  ?  ?  ?  ? This Visit's Progress  ?   Patient Stated (pt-stated)     ?   Would like to be able to take his walker and go outside.  ?  ? ?  ?  ? ?This is a list of Health Maintenance Items that are overdue or due now: ?Shingrix vaccine ?Hemoglobin A1C - Had one drawn in April at dialysis and he was told it was 6.2. ? ?Patient declined shingles vaccine.  ?  ? ?Orders/Referrals Placed Today: ?No orders of the defined types were placed in this encounter. ? ?(Contact our referral department at [phone] you have not spoken with someone about your referral appointment within the next 5 days)  ? ? ?Follow-up  Plan ?Follow-up with MHali Marry MD as planned ?Please bring a copy of your hemoglobin A1C results from dialysis.  ?Medicare wellness visit in one year.  ?Patient will access on my chart. ? ? ?  ?Health Maintenance, Male ?Adopting a healthy lifestyle and getting preventive care are important in promoting health and wellness. Ask your health care provider about: ?The right schedule for you to have regular tests and exams. ?Things you can do on your own to prevent diseases and keep yourself healthy. ?What should I know about diet, weight, and exercise? ?Eat a healthy diet ? ?Eat a diet that includes plenty of vegetables, fruits, low-fat dairy products, and lean protein. ?Do not eat a lot of foods that are high in solid fats, added sugars, or sodium. ?Maintain a healthy weight ?Body mass index (BMI) is a measurement that can be used to identify possible weight problems. It estimates body fat based on height and weight. Your health care provider can help determine your BMI and help you achieve or maintain a healthy weight. ?Get regular exercise ?Get regular exercise. This is one of the most important things you can do for your health. Most adults should: ?Exercise for at least 150 minutes each week. The exercise should increase your heart rate and make you sweat (moderate-intensity exercise). ?Do strengthening exercises at least twice a week. This is in addition to the moderate-intensity exercise. ?Spend less time sitting. Even light physical activity can be beneficial. ?Watch cholesterol and  blood lipids ?Have your blood tested for lipids and cholesterol at 77 years of age, then have this test every 5 years. ?You may need to have your cholesterol levels checked more often if: ?Your lipid or cholesterol levels are high. ?You are older than 77 years of age. ?You are at high risk for heart disease. ?What should I know about cancer screening? ?Many types of cancers can be detected early and may often be  prevented. Depending on your health history and family history, you may need to have cancer screening at various ages. This may include screening for: ?Colorectal cancer. ?Prostate cancer. ?Skin cancer. ?Lung cancer. ?What should I know about heart disease, diabetes, and high blood pressure? ?Blood pressure and heart disease ?High blood pressure causes heart disease and increases the risk of stroke. This is more likely to develop in people who have high blood pressure readings or are overweight. ?Talk with your health care provider about your target blood pressure readings. ?Have your blood pressure checked: ?Every 3-5 years if you are 11-57 years of age. ?Every year if you are 37 years old or older. ?If you are between the ages of 72 and 24 and are a current or former smoker, ask your health care provider if you should have a one-time screening for abdominal aortic aneurysm (AAA). ?Diabetes ?Have regular diabetes screenings. This checks your fasting blood sugar level. Have the screening done: ?Once every three years after age 49 if you are at a normal weight and have a low risk for diabetes. ?More often and at a younger age if you are overweight or have a high risk for diabetes. ?What should I know about preventing infection? ?Hepatitis B ?If you have a higher risk for hepatitis B, you should be screened for this virus. Talk with your health care provider to find out if you are at risk for hepatitis B infection. ?Hepatitis C ?Blood testing is recommended for: ?Everyone born from [birth year] through 1966. ?Anyone with known risk factors for hepatitis C. ?Sexually transmitted infections (STIs) ?You should be screened each year for STIs, including gonorrhea and chlamydia, if: ?You are sexually active and are younger than 77 years of age. ?You are older than 77 years of age and your health care provider tells you that you are at risk for this type of infection. ?Your sexual activity has changed since you were last screened,  and you are at increased risk for chlamydia or gonorrhea. Ask your health care provider if you are at risk. ?Ask your health care provider about whether you are at high risk for HIV. Your health care provider may recommend a prescription medicine to help prevent HIV infection. If you choose to take medicine to prevent HIV, you should first get tested for HIV. You should then be tested every 3 months for as long as you are taking the medicine. ?Follow these instructions at home: ?Alcohol use ?Do not drink alcohol if your health care provider tells you not to drink. ?If you drink alcohol: ?Limit how much you have to 0-2 drinks a day. ?Know how much alcohol is in your drink. In the [place] one drink equals one 12 oz bottle of beer (355 mL), one 5 oz glass of wine (148 mL), or one 1? oz glass of hard liquor (44 mL). ?Lifestyle ?Do not use any products that contain nicotine or tobacco. These products include cigarettes, chewing tobacco, and vaping devices, such as e-cigarettes. If you need help quitting, ask your health care provider. ?Do  not use street drugs. ?Do not share needles. ?Ask your health care provider for help if you need support or information about quitting drugs. ?General instructions ?Schedule regular health, dental, and eye exams. ?Stay current with your vaccines. ?Tell your health care provider if: ?You often feel depressed. ?You have ever been abused or do not feel safe at home. ?Summary ?Adopting a healthy lifestyle and getting preventive care are important in promoting health and wellness. ?Follow your health care provider's instructions about healthy diet, exercising, and getting tested or screened for diseases. ?Follow your health care provider's instructions on monitoring your cholesterol and blood pressure. ?This information is not intended to replace advice given to you by your health care provider. Make sure you discuss any questions you have with your health care provider. ?Document Revised:  03/28/2021 Document Reviewed: 03/28/2021 ?Elsevier Patient Education ? [employer]. ? ?

## 2022-03-28 DIAGNOSIS — E1122 Type 2 diabetes mellitus with diabetic chronic kidney disease: Secondary | ICD-10-CM | POA: Diagnosis not present

## 2022-03-28 DIAGNOSIS — N2581 Secondary hyperparathyroidism of renal origin: Secondary | ICD-10-CM | POA: Diagnosis not present

## 2022-03-28 DIAGNOSIS — D509 Iron deficiency anemia, unspecified: Secondary | ICD-10-CM | POA: Diagnosis not present

## 2022-03-28 DIAGNOSIS — Z992 Dependence on renal dialysis: Secondary | ICD-10-CM | POA: Diagnosis not present

## 2022-03-28 DIAGNOSIS — N186 End stage renal disease: Secondary | ICD-10-CM | POA: Diagnosis not present

## 2022-03-29 DIAGNOSIS — Z20822 Contact with and (suspected) exposure to covid-19: Secondary | ICD-10-CM | POA: Diagnosis not present

## 2022-03-30 DIAGNOSIS — Z992 Dependence on renal dialysis: Secondary | ICD-10-CM | POA: Diagnosis not present

## 2022-03-30 DIAGNOSIS — D509 Iron deficiency anemia, unspecified: Secondary | ICD-10-CM | POA: Diagnosis not present

## 2022-03-30 DIAGNOSIS — N186 End stage renal disease: Secondary | ICD-10-CM | POA: Diagnosis not present

## 2022-03-30 DIAGNOSIS — N2581 Secondary hyperparathyroidism of renal origin: Secondary | ICD-10-CM | POA: Diagnosis not present

## 2022-03-30 DIAGNOSIS — Z961 Presence of intraocular lens: Secondary | ICD-10-CM | POA: Diagnosis not present

## 2022-03-30 DIAGNOSIS — E113213 Type 2 diabetes mellitus with mild nonproliferative diabetic retinopathy with macular edema, bilateral: Secondary | ICD-10-CM | POA: Diagnosis not present

## 2022-03-30 DIAGNOSIS — E1122 Type 2 diabetes mellitus with diabetic chronic kidney disease: Secondary | ICD-10-CM | POA: Diagnosis not present

## 2022-03-30 DIAGNOSIS — Z794 Long term (current) use of insulin: Secondary | ICD-10-CM | POA: Diagnosis not present

## 2022-04-01 DIAGNOSIS — N2581 Secondary hyperparathyroidism of renal origin: Secondary | ICD-10-CM | POA: Diagnosis not present

## 2022-04-01 DIAGNOSIS — Z992 Dependence on renal dialysis: Secondary | ICD-10-CM | POA: Diagnosis not present

## 2022-04-01 DIAGNOSIS — E1122 Type 2 diabetes mellitus with diabetic chronic kidney disease: Secondary | ICD-10-CM | POA: Diagnosis not present

## 2022-04-01 DIAGNOSIS — D509 Iron deficiency anemia, unspecified: Secondary | ICD-10-CM | POA: Diagnosis not present

## 2022-04-01 DIAGNOSIS — N186 End stage renal disease: Secondary | ICD-10-CM | POA: Diagnosis not present

## 2022-04-04 DIAGNOSIS — N186 End stage renal disease: Secondary | ICD-10-CM | POA: Diagnosis not present

## 2022-04-04 DIAGNOSIS — D509 Iron deficiency anemia, unspecified: Secondary | ICD-10-CM | POA: Diagnosis not present

## 2022-04-04 DIAGNOSIS — N2581 Secondary hyperparathyroidism of renal origin: Secondary | ICD-10-CM | POA: Diagnosis not present

## 2022-04-04 DIAGNOSIS — Z992 Dependence on renal dialysis: Secondary | ICD-10-CM | POA: Diagnosis not present

## 2022-04-04 DIAGNOSIS — E1122 Type 2 diabetes mellitus with diabetic chronic kidney disease: Secondary | ICD-10-CM | POA: Diagnosis not present

## 2022-04-06 DIAGNOSIS — Z992 Dependence on renal dialysis: Secondary | ICD-10-CM | POA: Diagnosis not present

## 2022-04-06 DIAGNOSIS — D509 Iron deficiency anemia, unspecified: Secondary | ICD-10-CM | POA: Diagnosis not present

## 2022-04-06 DIAGNOSIS — E1122 Type 2 diabetes mellitus with diabetic chronic kidney disease: Secondary | ICD-10-CM | POA: Diagnosis not present

## 2022-04-06 DIAGNOSIS — N2581 Secondary hyperparathyroidism of renal origin: Secondary | ICD-10-CM | POA: Diagnosis not present

## 2022-04-06 DIAGNOSIS — N186 End stage renal disease: Secondary | ICD-10-CM | POA: Diagnosis not present

## 2022-04-08 DIAGNOSIS — N2581 Secondary hyperparathyroidism of renal origin: Secondary | ICD-10-CM | POA: Diagnosis not present

## 2022-04-08 DIAGNOSIS — D509 Iron deficiency anemia, unspecified: Secondary | ICD-10-CM | POA: Diagnosis not present

## 2022-04-08 DIAGNOSIS — N186 End stage renal disease: Secondary | ICD-10-CM | POA: Diagnosis not present

## 2022-04-08 DIAGNOSIS — E1122 Type 2 diabetes mellitus with diabetic chronic kidney disease: Secondary | ICD-10-CM | POA: Diagnosis not present

## 2022-04-08 DIAGNOSIS — Z992 Dependence on renal dialysis: Secondary | ICD-10-CM | POA: Diagnosis not present

## 2022-04-11 DIAGNOSIS — E1122 Type 2 diabetes mellitus with diabetic chronic kidney disease: Secondary | ICD-10-CM | POA: Diagnosis not present

## 2022-04-11 DIAGNOSIS — N2581 Secondary hyperparathyroidism of renal origin: Secondary | ICD-10-CM | POA: Diagnosis not present

## 2022-04-11 DIAGNOSIS — D509 Iron deficiency anemia, unspecified: Secondary | ICD-10-CM | POA: Diagnosis not present

## 2022-04-11 DIAGNOSIS — Z992 Dependence on renal dialysis: Secondary | ICD-10-CM | POA: Diagnosis not present

## 2022-04-11 DIAGNOSIS — N186 End stage renal disease: Secondary | ICD-10-CM | POA: Diagnosis not present

## 2022-04-13 DIAGNOSIS — Z992 Dependence on renal dialysis: Secondary | ICD-10-CM | POA: Diagnosis not present

## 2022-04-13 DIAGNOSIS — N2581 Secondary hyperparathyroidism of renal origin: Secondary | ICD-10-CM | POA: Diagnosis not present

## 2022-04-13 DIAGNOSIS — D509 Iron deficiency anemia, unspecified: Secondary | ICD-10-CM | POA: Diagnosis not present

## 2022-04-13 DIAGNOSIS — E1122 Type 2 diabetes mellitus with diabetic chronic kidney disease: Secondary | ICD-10-CM | POA: Diagnosis not present

## 2022-04-13 DIAGNOSIS — N186 End stage renal disease: Secondary | ICD-10-CM | POA: Diagnosis not present

## 2022-04-15 DIAGNOSIS — N2581 Secondary hyperparathyroidism of renal origin: Secondary | ICD-10-CM | POA: Diagnosis not present

## 2022-04-15 DIAGNOSIS — E1122 Type 2 diabetes mellitus with diabetic chronic kidney disease: Secondary | ICD-10-CM | POA: Diagnosis not present

## 2022-04-15 DIAGNOSIS — D509 Iron deficiency anemia, unspecified: Secondary | ICD-10-CM | POA: Diagnosis not present

## 2022-04-15 DIAGNOSIS — N186 End stage renal disease: Secondary | ICD-10-CM | POA: Diagnosis not present

## 2022-04-15 DIAGNOSIS — Z992 Dependence on renal dialysis: Secondary | ICD-10-CM | POA: Diagnosis not present

## 2022-04-18 ENCOUNTER — Telehealth: Payer: Self-pay | Admitting: Cardiology

## 2022-04-18 DIAGNOSIS — N186 End stage renal disease: Secondary | ICD-10-CM | POA: Diagnosis not present

## 2022-04-18 DIAGNOSIS — E1122 Type 2 diabetes mellitus with diabetic chronic kidney disease: Secondary | ICD-10-CM | POA: Diagnosis not present

## 2022-04-18 DIAGNOSIS — N2581 Secondary hyperparathyroidism of renal origin: Secondary | ICD-10-CM | POA: Diagnosis not present

## 2022-04-18 DIAGNOSIS — Z992 Dependence on renal dialysis: Secondary | ICD-10-CM | POA: Diagnosis not present

## 2022-04-18 DIAGNOSIS — D509 Iron deficiency anemia, unspecified: Secondary | ICD-10-CM | POA: Diagnosis not present

## 2022-04-18 NOTE — Telephone Encounter (Signed)
Pt c/o medication issue:  1. Name of Medication: Eliquis  2. How are you currently taking this medication (dosage and times per day)? 2 times a day  3. Are you having a reaction (difficulty breathing--STAT)?   4. What is your medication issue? Bleeding a lot- stopped a few minutes ago

## 2022-04-18 NOTE — Telephone Encounter (Signed)
Wife updated and verbalized understanding.  

## 2022-04-18 NOTE — Telephone Encounter (Signed)
He is on the correct dose, would not recommend decreasing without authorization from Dr Stanford Breed

## 2022-04-18 NOTE — Telephone Encounter (Signed)
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 She state today dialysis had a difficult time getting to stop bleeding and it's happened 2 other occasions. Wife is wondering if Elqiuis dose need to be decreased.

## 2022-04-20 DIAGNOSIS — N186 End stage renal disease: Secondary | ICD-10-CM | POA: Diagnosis not present

## 2022-04-20 DIAGNOSIS — E1122 Type 2 diabetes mellitus with diabetic chronic kidney disease: Secondary | ICD-10-CM | POA: Diagnosis not present

## 2022-04-20 DIAGNOSIS — N2581 Secondary hyperparathyroidism of renal origin: Secondary | ICD-10-CM | POA: Diagnosis not present

## 2022-04-20 DIAGNOSIS — D509 Iron deficiency anemia, unspecified: Secondary | ICD-10-CM | POA: Diagnosis not present

## 2022-04-20 DIAGNOSIS — Z992 Dependence on renal dialysis: Secondary | ICD-10-CM | POA: Diagnosis not present

## 2022-04-22 DIAGNOSIS — E1122 Type 2 diabetes mellitus with diabetic chronic kidney disease: Secondary | ICD-10-CM | POA: Diagnosis not present

## 2022-04-22 DIAGNOSIS — Z992 Dependence on renal dialysis: Secondary | ICD-10-CM | POA: Diagnosis not present

## 2022-04-22 DIAGNOSIS — D509 Iron deficiency anemia, unspecified: Secondary | ICD-10-CM | POA: Diagnosis not present

## 2022-04-22 DIAGNOSIS — N2581 Secondary hyperparathyroidism of renal origin: Secondary | ICD-10-CM | POA: Diagnosis not present

## 2022-04-22 DIAGNOSIS — N186 End stage renal disease: Secondary | ICD-10-CM | POA: Diagnosis not present

## 2022-04-25 DIAGNOSIS — D509 Iron deficiency anemia, unspecified: Secondary | ICD-10-CM | POA: Diagnosis not present

## 2022-04-25 DIAGNOSIS — Z961 Presence of intraocular lens: Secondary | ICD-10-CM | POA: Diagnosis not present

## 2022-04-25 DIAGNOSIS — Z992 Dependence on renal dialysis: Secondary | ICD-10-CM | POA: Diagnosis not present

## 2022-04-25 DIAGNOSIS — E1122 Type 2 diabetes mellitus with diabetic chronic kidney disease: Secondary | ICD-10-CM | POA: Diagnosis not present

## 2022-04-25 DIAGNOSIS — E113213 Type 2 diabetes mellitus with mild nonproliferative diabetic retinopathy with macular edema, bilateral: Secondary | ICD-10-CM | POA: Diagnosis not present

## 2022-04-25 DIAGNOSIS — N186 End stage renal disease: Secondary | ICD-10-CM | POA: Diagnosis not present

## 2022-04-25 DIAGNOSIS — N2581 Secondary hyperparathyroidism of renal origin: Secondary | ICD-10-CM | POA: Diagnosis not present

## 2022-04-25 DIAGNOSIS — Z794 Long term (current) use of insulin: Secondary | ICD-10-CM | POA: Diagnosis not present

## 2022-04-27 DIAGNOSIS — Z992 Dependence on renal dialysis: Secondary | ICD-10-CM | POA: Diagnosis not present

## 2022-04-27 DIAGNOSIS — E1122 Type 2 diabetes mellitus with diabetic chronic kidney disease: Secondary | ICD-10-CM | POA: Diagnosis not present

## 2022-04-27 DIAGNOSIS — N2581 Secondary hyperparathyroidism of renal origin: Secondary | ICD-10-CM | POA: Diagnosis not present

## 2022-04-27 DIAGNOSIS — D509 Iron deficiency anemia, unspecified: Secondary | ICD-10-CM | POA: Diagnosis not present

## 2022-04-27 DIAGNOSIS — N186 End stage renal disease: Secondary | ICD-10-CM | POA: Diagnosis not present

## 2022-04-29 DIAGNOSIS — D509 Iron deficiency anemia, unspecified: Secondary | ICD-10-CM | POA: Diagnosis not present

## 2022-04-29 DIAGNOSIS — E1122 Type 2 diabetes mellitus with diabetic chronic kidney disease: Secondary | ICD-10-CM | POA: Diagnosis not present

## 2022-04-29 DIAGNOSIS — N186 End stage renal disease: Secondary | ICD-10-CM | POA: Diagnosis not present

## 2022-04-29 DIAGNOSIS — Z992 Dependence on renal dialysis: Secondary | ICD-10-CM | POA: Diagnosis not present

## 2022-04-29 DIAGNOSIS — N2581 Secondary hyperparathyroidism of renal origin: Secondary | ICD-10-CM | POA: Diagnosis not present

## 2022-05-02 ENCOUNTER — Other Ambulatory Visit: Payer: Self-pay | Admitting: Cardiology

## 2022-05-02 DIAGNOSIS — Z992 Dependence on renal dialysis: Secondary | ICD-10-CM | POA: Diagnosis not present

## 2022-05-02 DIAGNOSIS — E1122 Type 2 diabetes mellitus with diabetic chronic kidney disease: Secondary | ICD-10-CM | POA: Diagnosis not present

## 2022-05-02 DIAGNOSIS — D509 Iron deficiency anemia, unspecified: Secondary | ICD-10-CM | POA: Diagnosis not present

## 2022-05-02 DIAGNOSIS — N186 End stage renal disease: Secondary | ICD-10-CM | POA: Diagnosis not present

## 2022-05-02 DIAGNOSIS — N2581 Secondary hyperparathyroidism of renal origin: Secondary | ICD-10-CM | POA: Diagnosis not present

## 2022-05-04 DIAGNOSIS — Z992 Dependence on renal dialysis: Secondary | ICD-10-CM | POA: Diagnosis not present

## 2022-05-04 DIAGNOSIS — E1122 Type 2 diabetes mellitus with diabetic chronic kidney disease: Secondary | ICD-10-CM | POA: Diagnosis not present

## 2022-05-04 DIAGNOSIS — N186 End stage renal disease: Secondary | ICD-10-CM | POA: Diagnosis not present

## 2022-05-04 DIAGNOSIS — N2581 Secondary hyperparathyroidism of renal origin: Secondary | ICD-10-CM | POA: Diagnosis not present

## 2022-05-04 DIAGNOSIS — D509 Iron deficiency anemia, unspecified: Secondary | ICD-10-CM | POA: Diagnosis not present

## 2022-05-06 DIAGNOSIS — E1122 Type 2 diabetes mellitus with diabetic chronic kidney disease: Secondary | ICD-10-CM | POA: Diagnosis not present

## 2022-05-06 DIAGNOSIS — N2581 Secondary hyperparathyroidism of renal origin: Secondary | ICD-10-CM | POA: Diagnosis not present

## 2022-05-06 DIAGNOSIS — D509 Iron deficiency anemia, unspecified: Secondary | ICD-10-CM | POA: Diagnosis not present

## 2022-05-06 DIAGNOSIS — N186 End stage renal disease: Secondary | ICD-10-CM | POA: Diagnosis not present

## 2022-05-06 DIAGNOSIS — Z992 Dependence on renal dialysis: Secondary | ICD-10-CM | POA: Diagnosis not present

## 2022-05-09 DIAGNOSIS — D509 Iron deficiency anemia, unspecified: Secondary | ICD-10-CM | POA: Diagnosis not present

## 2022-05-09 DIAGNOSIS — Z992 Dependence on renal dialysis: Secondary | ICD-10-CM | POA: Diagnosis not present

## 2022-05-09 DIAGNOSIS — E1122 Type 2 diabetes mellitus with diabetic chronic kidney disease: Secondary | ICD-10-CM | POA: Diagnosis not present

## 2022-05-09 DIAGNOSIS — N2581 Secondary hyperparathyroidism of renal origin: Secondary | ICD-10-CM | POA: Diagnosis not present

## 2022-05-09 DIAGNOSIS — N186 End stage renal disease: Secondary | ICD-10-CM | POA: Diagnosis not present

## 2022-05-11 DIAGNOSIS — Z992 Dependence on renal dialysis: Secondary | ICD-10-CM | POA: Diagnosis not present

## 2022-05-11 DIAGNOSIS — D509 Iron deficiency anemia, unspecified: Secondary | ICD-10-CM | POA: Diagnosis not present

## 2022-05-11 DIAGNOSIS — N186 End stage renal disease: Secondary | ICD-10-CM | POA: Diagnosis not present

## 2022-05-11 DIAGNOSIS — E1122 Type 2 diabetes mellitus with diabetic chronic kidney disease: Secondary | ICD-10-CM | POA: Diagnosis not present

## 2022-05-11 DIAGNOSIS — N2581 Secondary hyperparathyroidism of renal origin: Secondary | ICD-10-CM | POA: Diagnosis not present

## 2022-05-13 DIAGNOSIS — D509 Iron deficiency anemia, unspecified: Secondary | ICD-10-CM | POA: Diagnosis not present

## 2022-05-13 DIAGNOSIS — N2581 Secondary hyperparathyroidism of renal origin: Secondary | ICD-10-CM | POA: Diagnosis not present

## 2022-05-13 DIAGNOSIS — Z992 Dependence on renal dialysis: Secondary | ICD-10-CM | POA: Diagnosis not present

## 2022-05-13 DIAGNOSIS — E1122 Type 2 diabetes mellitus with diabetic chronic kidney disease: Secondary | ICD-10-CM | POA: Diagnosis not present

## 2022-05-13 DIAGNOSIS — N186 End stage renal disease: Secondary | ICD-10-CM | POA: Diagnosis not present

## 2022-05-15 ENCOUNTER — Ambulatory Visit (INDEPENDENT_AMBULATORY_CARE_PROVIDER_SITE_OTHER): Payer: Medicare Other | Admitting: Family Medicine

## 2022-05-15 ENCOUNTER — Encounter: Payer: Self-pay | Admitting: Family Medicine

## 2022-05-15 ENCOUNTER — Other Ambulatory Visit: Payer: Self-pay | Admitting: Family Medicine

## 2022-05-15 VITALS — BP 120/83 | HR 86 | Wt 221.1 lb

## 2022-05-15 DIAGNOSIS — Z794 Long term (current) use of insulin: Secondary | ICD-10-CM

## 2022-05-15 DIAGNOSIS — I10 Essential (primary) hypertension: Secondary | ICD-10-CM

## 2022-05-15 DIAGNOSIS — M1460 Charcot's joint, unspecified site: Secondary | ICD-10-CM | POA: Diagnosis not present

## 2022-05-15 DIAGNOSIS — N186 End stage renal disease: Secondary | ICD-10-CM

## 2022-05-15 DIAGNOSIS — E1122 Type 2 diabetes mellitus with diabetic chronic kidney disease: Secondary | ICD-10-CM

## 2022-05-15 DIAGNOSIS — E119 Type 2 diabetes mellitus without complications: Secondary | ICD-10-CM

## 2022-05-15 DIAGNOSIS — E11610 Type 2 diabetes mellitus with diabetic neuropathic arthropathy: Secondary | ICD-10-CM | POA: Diagnosis not present

## 2022-05-15 DIAGNOSIS — I872 Venous insufficiency (chronic) (peripheral): Secondary | ICD-10-CM

## 2022-05-15 DIAGNOSIS — I2510 Atherosclerotic heart disease of native coronary artery without angina pectoris: Secondary | ICD-10-CM

## 2022-05-15 DIAGNOSIS — N184 Chronic kidney disease, stage 4 (severe): Secondary | ICD-10-CM

## 2022-05-15 MED ORDER — FREESTYLE LIBRE 14 DAY SENSOR MISC: 99 refills | Status: AC

## 2022-05-15 MED ORDER — ONETOUCH ULTRA VI STRP: ORAL_STRIP | 99 refills | Status: DC

## 2022-05-15 NOTE — Assessment & Plan Note (Signed)
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

## 2022-05-16 DIAGNOSIS — E1122 Type 2 diabetes mellitus with diabetic chronic kidney disease: Secondary | ICD-10-CM | POA: Diagnosis not present

## 2022-05-16 DIAGNOSIS — Z992 Dependence on renal dialysis: Secondary | ICD-10-CM | POA: Diagnosis not present

## 2022-05-16 DIAGNOSIS — N2581 Secondary hyperparathyroidism of renal origin: Secondary | ICD-10-CM | POA: Diagnosis not present

## 2022-05-16 DIAGNOSIS — N186 End stage renal disease: Secondary | ICD-10-CM | POA: Diagnosis not present

## 2022-05-16 DIAGNOSIS — D509 Iron deficiency anemia, unspecified: Secondary | ICD-10-CM | POA: Diagnosis not present

## 2022-05-18 DIAGNOSIS — N186 End stage renal disease: Secondary | ICD-10-CM | POA: Diagnosis not present

## 2022-05-18 DIAGNOSIS — D509 Iron deficiency anemia, unspecified: Secondary | ICD-10-CM | POA: Diagnosis not present

## 2022-05-18 DIAGNOSIS — E1122 Type 2 diabetes mellitus with diabetic chronic kidney disease: Secondary | ICD-10-CM | POA: Diagnosis not present

## 2022-05-18 DIAGNOSIS — N2581 Secondary hyperparathyroidism of renal origin: Secondary | ICD-10-CM | POA: Diagnosis not present

## 2022-05-18 DIAGNOSIS — Z992 Dependence on renal dialysis: Secondary | ICD-10-CM | POA: Diagnosis not present

## 2022-05-20 DIAGNOSIS — E1122 Type 2 diabetes mellitus with diabetic chronic kidney disease: Secondary | ICD-10-CM | POA: Diagnosis not present

## 2022-05-20 DIAGNOSIS — D509 Iron deficiency anemia, unspecified: Secondary | ICD-10-CM | POA: Diagnosis not present

## 2022-05-20 DIAGNOSIS — N2581 Secondary hyperparathyroidism of renal origin: Secondary | ICD-10-CM | POA: Diagnosis not present

## 2022-05-20 DIAGNOSIS — Z992 Dependence on renal dialysis: Secondary | ICD-10-CM | POA: Diagnosis not present

## 2022-05-20 DIAGNOSIS — N186 End stage renal disease: Secondary | ICD-10-CM | POA: Diagnosis not present

## 2022-05-23 DIAGNOSIS — D509 Iron deficiency anemia, unspecified: Secondary | ICD-10-CM | POA: Diagnosis not present

## 2022-05-23 DIAGNOSIS — Z992 Dependence on renal dialysis: Secondary | ICD-10-CM | POA: Diagnosis not present

## 2022-05-23 DIAGNOSIS — N2581 Secondary hyperparathyroidism of renal origin: Secondary | ICD-10-CM | POA: Diagnosis not present

## 2022-05-23 DIAGNOSIS — N186 End stage renal disease: Secondary | ICD-10-CM | POA: Diagnosis not present

## 2022-05-23 DIAGNOSIS — E1122 Type 2 diabetes mellitus with diabetic chronic kidney disease: Secondary | ICD-10-CM | POA: Diagnosis not present

## 2022-05-25 DIAGNOSIS — E1122 Type 2 diabetes mellitus with diabetic chronic kidney disease: Secondary | ICD-10-CM | POA: Diagnosis not present

## 2022-05-25 DIAGNOSIS — Z992 Dependence on renal dialysis: Secondary | ICD-10-CM | POA: Diagnosis not present

## 2022-05-25 DIAGNOSIS — N186 End stage renal disease: Secondary | ICD-10-CM | POA: Diagnosis not present

## 2022-05-25 DIAGNOSIS — N2581 Secondary hyperparathyroidism of renal origin: Secondary | ICD-10-CM | POA: Diagnosis not present

## 2022-05-25 DIAGNOSIS — D509 Iron deficiency anemia, unspecified: Secondary | ICD-10-CM | POA: Diagnosis not present

## 2022-05-27 DIAGNOSIS — D509 Iron deficiency anemia, unspecified: Secondary | ICD-10-CM | POA: Diagnosis not present

## 2022-05-27 DIAGNOSIS — N186 End stage renal disease: Secondary | ICD-10-CM | POA: Diagnosis not present

## 2022-05-27 DIAGNOSIS — E1122 Type 2 diabetes mellitus with diabetic chronic kidney disease: Secondary | ICD-10-CM | POA: Diagnosis not present

## 2022-05-27 DIAGNOSIS — Z992 Dependence on renal dialysis: Secondary | ICD-10-CM | POA: Diagnosis not present

## 2022-05-27 DIAGNOSIS — N2581 Secondary hyperparathyroidism of renal origin: Secondary | ICD-10-CM | POA: Diagnosis not present

## 2022-05-30 DIAGNOSIS — D509 Iron deficiency anemia, unspecified: Secondary | ICD-10-CM | POA: Diagnosis not present

## 2022-05-30 DIAGNOSIS — Z992 Dependence on renal dialysis: Secondary | ICD-10-CM | POA: Diagnosis not present

## 2022-05-30 DIAGNOSIS — E1122 Type 2 diabetes mellitus with diabetic chronic kidney disease: Secondary | ICD-10-CM | POA: Diagnosis not present

## 2022-05-30 DIAGNOSIS — N2581 Secondary hyperparathyroidism of renal origin: Secondary | ICD-10-CM | POA: Diagnosis not present

## 2022-05-30 DIAGNOSIS — N186 End stage renal disease: Secondary | ICD-10-CM | POA: Diagnosis not present

## 2022-06-01 DIAGNOSIS — N2581 Secondary hyperparathyroidism of renal origin: Secondary | ICD-10-CM | POA: Diagnosis not present

## 2022-06-01 DIAGNOSIS — E113213 Type 2 diabetes mellitus with mild nonproliferative diabetic retinopathy with macular edema, bilateral: Secondary | ICD-10-CM | POA: Diagnosis not present

## 2022-06-01 DIAGNOSIS — D509 Iron deficiency anemia, unspecified: Secondary | ICD-10-CM | POA: Diagnosis not present

## 2022-06-01 DIAGNOSIS — E1122 Type 2 diabetes mellitus with diabetic chronic kidney disease: Secondary | ICD-10-CM | POA: Diagnosis not present

## 2022-06-01 DIAGNOSIS — Z992 Dependence on renal dialysis: Secondary | ICD-10-CM | POA: Diagnosis not present

## 2022-06-01 DIAGNOSIS — N186 End stage renal disease: Secondary | ICD-10-CM | POA: Diagnosis not present

## 2022-06-01 DIAGNOSIS — Z961 Presence of intraocular lens: Secondary | ICD-10-CM | POA: Diagnosis not present

## 2022-06-01 DIAGNOSIS — Z794 Long term (current) use of insulin: Secondary | ICD-10-CM | POA: Diagnosis not present

## 2022-06-03 DIAGNOSIS — Z992 Dependence on renal dialysis: Secondary | ICD-10-CM | POA: Diagnosis not present

## 2022-06-03 DIAGNOSIS — N2581 Secondary hyperparathyroidism of renal origin: Secondary | ICD-10-CM | POA: Diagnosis not present

## 2022-06-03 DIAGNOSIS — E1122 Type 2 diabetes mellitus with diabetic chronic kidney disease: Secondary | ICD-10-CM | POA: Diagnosis not present

## 2022-06-03 DIAGNOSIS — N186 End stage renal disease: Secondary | ICD-10-CM | POA: Diagnosis not present

## 2022-06-03 DIAGNOSIS — D509 Iron deficiency anemia, unspecified: Secondary | ICD-10-CM | POA: Diagnosis not present

## 2022-06-06 DIAGNOSIS — N186 End stage renal disease: Secondary | ICD-10-CM | POA: Diagnosis not present

## 2022-06-06 DIAGNOSIS — Z992 Dependence on renal dialysis: Secondary | ICD-10-CM | POA: Diagnosis not present

## 2022-06-06 DIAGNOSIS — D509 Iron deficiency anemia, unspecified: Secondary | ICD-10-CM | POA: Diagnosis not present

## 2022-06-06 DIAGNOSIS — N2581 Secondary hyperparathyroidism of renal origin: Secondary | ICD-10-CM | POA: Diagnosis not present

## 2022-06-06 DIAGNOSIS — E1122 Type 2 diabetes mellitus with diabetic chronic kidney disease: Secondary | ICD-10-CM | POA: Diagnosis not present

## 2022-06-08 DIAGNOSIS — N2581 Secondary hyperparathyroidism of renal origin: Secondary | ICD-10-CM | POA: Diagnosis not present

## 2022-06-08 DIAGNOSIS — Z992 Dependence on renal dialysis: Secondary | ICD-10-CM | POA: Diagnosis not present

## 2022-06-08 DIAGNOSIS — D509 Iron deficiency anemia, unspecified: Secondary | ICD-10-CM | POA: Diagnosis not present

## 2022-06-08 DIAGNOSIS — N186 End stage renal disease: Secondary | ICD-10-CM | POA: Diagnosis not present

## 2022-06-08 DIAGNOSIS — E1122 Type 2 diabetes mellitus with diabetic chronic kidney disease: Secondary | ICD-10-CM | POA: Diagnosis not present

## 2022-06-10 DIAGNOSIS — D509 Iron deficiency anemia, unspecified: Secondary | ICD-10-CM | POA: Diagnosis not present

## 2022-06-10 DIAGNOSIS — N2581 Secondary hyperparathyroidism of renal origin: Secondary | ICD-10-CM | POA: Diagnosis not present

## 2022-06-10 DIAGNOSIS — E1122 Type 2 diabetes mellitus with diabetic chronic kidney disease: Secondary | ICD-10-CM | POA: Diagnosis not present

## 2022-06-10 DIAGNOSIS — Z992 Dependence on renal dialysis: Secondary | ICD-10-CM | POA: Diagnosis not present

## 2022-06-10 DIAGNOSIS — N186 End stage renal disease: Secondary | ICD-10-CM | POA: Diagnosis not present

## 2022-06-13 DIAGNOSIS — D509 Iron deficiency anemia, unspecified: Secondary | ICD-10-CM | POA: Diagnosis not present

## 2022-06-13 DIAGNOSIS — E1122 Type 2 diabetes mellitus with diabetic chronic kidney disease: Secondary | ICD-10-CM | POA: Diagnosis not present

## 2022-06-13 DIAGNOSIS — Z992 Dependence on renal dialysis: Secondary | ICD-10-CM | POA: Diagnosis not present

## 2022-06-13 DIAGNOSIS — N186 End stage renal disease: Secondary | ICD-10-CM | POA: Diagnosis not present

## 2022-06-13 DIAGNOSIS — N2581 Secondary hyperparathyroidism of renal origin: Secondary | ICD-10-CM | POA: Diagnosis not present

## 2022-06-15 DIAGNOSIS — N2581 Secondary hyperparathyroidism of renal origin: Secondary | ICD-10-CM | POA: Diagnosis not present

## 2022-06-15 DIAGNOSIS — Z992 Dependence on renal dialysis: Secondary | ICD-10-CM | POA: Diagnosis not present

## 2022-06-15 DIAGNOSIS — N186 End stage renal disease: Secondary | ICD-10-CM | POA: Diagnosis not present

## 2022-06-15 DIAGNOSIS — E1122 Type 2 diabetes mellitus with diabetic chronic kidney disease: Secondary | ICD-10-CM | POA: Diagnosis not present

## 2022-06-15 DIAGNOSIS — D509 Iron deficiency anemia, unspecified: Secondary | ICD-10-CM | POA: Diagnosis not present

## 2022-06-17 DIAGNOSIS — Z992 Dependence on renal dialysis: Secondary | ICD-10-CM | POA: Diagnosis not present

## 2022-06-17 DIAGNOSIS — D509 Iron deficiency anemia, unspecified: Secondary | ICD-10-CM | POA: Diagnosis not present

## 2022-06-17 DIAGNOSIS — N2581 Secondary hyperparathyroidism of renal origin: Secondary | ICD-10-CM | POA: Diagnosis not present

## 2022-06-17 DIAGNOSIS — E1122 Type 2 diabetes mellitus with diabetic chronic kidney disease: Secondary | ICD-10-CM | POA: Diagnosis not present

## 2022-06-17 DIAGNOSIS — N186 End stage renal disease: Secondary | ICD-10-CM | POA: Diagnosis not present

## 2022-06-19 ENCOUNTER — Encounter: Payer: Self-pay | Admitting: Medical-Surgical

## 2022-06-19 ENCOUNTER — Ambulatory Visit (INDEPENDENT_AMBULATORY_CARE_PROVIDER_SITE_OTHER): Payer: Medicare Other | Admitting: Medical-Surgical

## 2022-06-19 VITALS — BP 112/70 | HR 76 | Resp 20 | Ht 67.0 in | Wt 221.0 lb

## 2022-06-19 DIAGNOSIS — L89302 Pressure ulcer of unspecified buttock, stage 2: Secondary | ICD-10-CM | POA: Diagnosis not present

## 2022-06-19 NOTE — Progress Notes (Addendum)
Established Patient Office Visit  Subjective   Patient ID: Louis [name], male   DOB: [birth year] Age: 76 y.o. MRN: [id number]   Chief Complaint  Patient presents with   sore on buttocks    HPI Very pleasant 76 year old male accompanied by his wife presenting today for evaluation of a sore that was noted on his bilateral buttocks approximately 3 weeks ago.  His wife notes that his skin and area it was wrinkling and irritated.  She did try several creams that she has at home but unfortunately the wound broke open over the weekend.  It started to bleed so she put bismuth bicarbonate on it.  Once it finally stopped bleeding, she changed the treatment to Medihoney covered by gauze and an ABD pad.  They did use some tape but unfortunately this took extra skin off worsening the problem.  He is sedentary and in a seated position most of the time.  He spends 4 hours in dialysis 3 days/week where he is sitting in a chair.  He does have a wheelchair that he sits in when out and about but at times he is sitting in a chair or on the couch at home. He also is in need for a hospital bed.  Pt requires frequent changes in body re-positioning in ways that cannot be achieved with a bed pillow or wedge to reduce pressure or treat existence of pressure sores. Pt cannot independently move on his own due to being weak and physically unable to move in bed. Pt requires  the head of the bed to be elevated more than 30 degrees most of the time due to  issues with swallowing.    Objective:    Vitals:   06/19/22 1453  BP: 112/70  Pulse: 76  Resp: 20  Height: '5\' 7"'$  (1.702 m)  Weight: 221 lb (100.2 kg)  SpO2: 96%  BMI (Calculated): 34.61   Physical Exam Vitals reviewed.  Constitutional:      General: He is not in acute distress.    Appearance: Normal appearance. He is obese. He is not ill-appearing.  HENT:     Head: Normocephalic.  Cardiovascular:     Rate and Rhythm: Normal rate.     Pulses: Normal pulses.      Heart sounds: Normal heart sounds. No murmur heard.    No friction rub. No gallop.  Pulmonary:     Effort: Pulmonary effort is normal. No respiratory distress.     Breath sounds: Normal breath sounds.  Skin:    General: Skin is warm and dry.       Neurological:     Mental Status: He is alert and oriented to person, place, and time.  Psychiatric:        Mood and Affect: Mood normal.        Behavior: Behavior normal.        Thought Content: Thought content normal.        Judgment: Judgment normal.   No results found for this or any previous visit (from the past 24 hour(s)).     The ASCVD Risk score (Arnett DK, et al., 2019) failed to calculate for the following reasons:   The valid total cholesterol range is 130 to 320 mg/dL   Assessment & Plan:   1. Pressure injury of buttock, stage 2, unspecified laterality (HCC) Clear pressure ulcer of the buttocks although the right is worse than the left.  I suspect with the centralized darkening, the left will also  open up soon.  Discussed the importance of preventing pressure to the affected area.  For now, okay to use Medihoney covered by gauze and ABD pad.  Referring to home health ([facility] per patient preference) for further wound assessment and management.  Please note that he does do dialysis 3 days weekly and will need to schedule his services surrounding that. - Ambulatory referral to [facility]  Return if symptoms worsen or fail to improve.  ___________________________________________ Clearnce Sorrel, DNP, APRN, FNP-BC [facility]

## 2022-06-20 DIAGNOSIS — Z992 Dependence on renal dialysis: Secondary | ICD-10-CM | POA: Diagnosis not present

## 2022-06-20 DIAGNOSIS — N2581 Secondary hyperparathyroidism of renal origin: Secondary | ICD-10-CM | POA: Diagnosis not present

## 2022-06-20 DIAGNOSIS — N186 End stage renal disease: Secondary | ICD-10-CM | POA: Diagnosis not present

## 2022-06-20 DIAGNOSIS — E1122 Type 2 diabetes mellitus with diabetic chronic kidney disease: Secondary | ICD-10-CM | POA: Diagnosis not present

## 2022-06-22 DIAGNOSIS — N186 End stage renal disease: Secondary | ICD-10-CM | POA: Diagnosis not present

## 2022-06-22 DIAGNOSIS — E1122 Type 2 diabetes mellitus with diabetic chronic kidney disease: Secondary | ICD-10-CM | POA: Diagnosis not present

## 2022-06-22 DIAGNOSIS — Z992 Dependence on renal dialysis: Secondary | ICD-10-CM | POA: Diagnosis not present

## 2022-06-22 DIAGNOSIS — N2581 Secondary hyperparathyroidism of renal origin: Secondary | ICD-10-CM | POA: Diagnosis not present

## 2022-06-24 DIAGNOSIS — N2581 Secondary hyperparathyroidism of renal origin: Secondary | ICD-10-CM | POA: Diagnosis not present

## 2022-06-24 DIAGNOSIS — E1122 Type 2 diabetes mellitus with diabetic chronic kidney disease: Secondary | ICD-10-CM | POA: Diagnosis not present

## 2022-06-24 DIAGNOSIS — Z992 Dependence on renal dialysis: Secondary | ICD-10-CM | POA: Diagnosis not present

## 2022-06-24 DIAGNOSIS — N186 End stage renal disease: Secondary | ICD-10-CM | POA: Diagnosis not present

## 2022-06-27 DIAGNOSIS — N186 End stage renal disease: Secondary | ICD-10-CM | POA: Diagnosis not present

## 2022-06-27 DIAGNOSIS — Z992 Dependence on renal dialysis: Secondary | ICD-10-CM | POA: Diagnosis not present

## 2022-06-27 DIAGNOSIS — N2581 Secondary hyperparathyroidism of renal origin: Secondary | ICD-10-CM | POA: Diagnosis not present

## 2022-06-27 DIAGNOSIS — E1122 Type 2 diabetes mellitus with diabetic chronic kidney disease: Secondary | ICD-10-CM | POA: Diagnosis not present

## 2022-06-29 DIAGNOSIS — N186 End stage renal disease: Secondary | ICD-10-CM | POA: Diagnosis not present

## 2022-06-29 DIAGNOSIS — Z992 Dependence on renal dialysis: Secondary | ICD-10-CM | POA: Diagnosis not present

## 2022-06-29 DIAGNOSIS — N2581 Secondary hyperparathyroidism of renal origin: Secondary | ICD-10-CM | POA: Diagnosis not present

## 2022-06-29 DIAGNOSIS — E1122 Type 2 diabetes mellitus with diabetic chronic kidney disease: Secondary | ICD-10-CM | POA: Diagnosis not present

## 2022-07-01 ENCOUNTER — Other Ambulatory Visit: Payer: Self-pay | Admitting: Cardiology

## 2022-07-01 DIAGNOSIS — Z992 Dependence on renal dialysis: Secondary | ICD-10-CM | POA: Diagnosis not present

## 2022-07-01 DIAGNOSIS — E1122 Type 2 diabetes mellitus with diabetic chronic kidney disease: Secondary | ICD-10-CM | POA: Diagnosis not present

## 2022-07-01 DIAGNOSIS — N186 End stage renal disease: Secondary | ICD-10-CM | POA: Diagnosis not present

## 2022-07-01 DIAGNOSIS — N2581 Secondary hyperparathyroidism of renal origin: Secondary | ICD-10-CM | POA: Diagnosis not present

## 2022-07-04 DIAGNOSIS — N186 End stage renal disease: Secondary | ICD-10-CM | POA: Diagnosis not present

## 2022-07-04 DIAGNOSIS — Z992 Dependence on renal dialysis: Secondary | ICD-10-CM | POA: Diagnosis not present

## 2022-07-04 DIAGNOSIS — N2581 Secondary hyperparathyroidism of renal origin: Secondary | ICD-10-CM | POA: Diagnosis not present

## 2022-07-04 DIAGNOSIS — E1122 Type 2 diabetes mellitus with diabetic chronic kidney disease: Secondary | ICD-10-CM | POA: Diagnosis not present

## 2022-07-06 ENCOUNTER — Other Ambulatory Visit: Payer: Self-pay | Admitting: Cardiology

## 2022-07-06 DIAGNOSIS — Z794 Long term (current) use of insulin: Secondary | ICD-10-CM | POA: Diagnosis not present

## 2022-07-06 DIAGNOSIS — N2581 Secondary hyperparathyroidism of renal origin: Secondary | ICD-10-CM | POA: Diagnosis not present

## 2022-07-06 DIAGNOSIS — Z961 Presence of intraocular lens: Secondary | ICD-10-CM | POA: Diagnosis not present

## 2022-07-06 DIAGNOSIS — E1122 Type 2 diabetes mellitus with diabetic chronic kidney disease: Secondary | ICD-10-CM | POA: Diagnosis not present

## 2022-07-06 DIAGNOSIS — N186 End stage renal disease: Secondary | ICD-10-CM | POA: Diagnosis not present

## 2022-07-06 DIAGNOSIS — E113213 Type 2 diabetes mellitus with mild nonproliferative diabetic retinopathy with macular edema, bilateral: Secondary | ICD-10-CM | POA: Diagnosis not present

## 2022-07-06 DIAGNOSIS — Z992 Dependence on renal dialysis: Secondary | ICD-10-CM | POA: Diagnosis not present

## 2022-07-06 NOTE — Telephone Encounter (Signed)
*  STAT* If patient is at the pharmacy, call can be transferred to refill team.   1. Which medications need to be refilled? (please list name of each medication and dose if known) rosuvastatin (CRESTOR) 40 MG tablet isosorbide dinitrate (ISORDIL) 20 MG tablet ELIQUIS 5 MG TABS tablet  2. Which pharmacy/location (including street and city if local pharmacy) is medication to be sent to?[facility] [place], [place]  3. Do they need a 30 day or 90 day supply? 45   Pt made a f/u October 31, 2022 with Dr. Stanford Breed

## 2022-07-07 MED ORDER — ELIQUIS 5 MG PO TABS
5.0000 mg | ORAL_TABLET | Freq: Two times a day (BID) | ORAL | 1 refills | Status: AC
Start: 2022-07-07 — End: ?

## 2022-07-07 NOTE — Telephone Encounter (Signed)
Prescription refill request for Eliquis received.  Indication: afib  Last office visit: [place], 11/28/2021 Scr: 4.09. 09/21/2021 Age: 76 Weight: 100.2 kg  Refill sent.

## 2022-07-08 DIAGNOSIS — N186 End stage renal disease: Secondary | ICD-10-CM | POA: Diagnosis not present

## 2022-07-08 DIAGNOSIS — N2581 Secondary hyperparathyroidism of renal origin: Secondary | ICD-10-CM | POA: Diagnosis not present

## 2022-07-08 DIAGNOSIS — Z992 Dependence on renal dialysis: Secondary | ICD-10-CM | POA: Diagnosis not present

## 2022-07-08 DIAGNOSIS — E1122 Type 2 diabetes mellitus with diabetic chronic kidney disease: Secondary | ICD-10-CM | POA: Diagnosis not present

## 2022-07-11 DIAGNOSIS — E1122 Type 2 diabetes mellitus with diabetic chronic kidney disease: Secondary | ICD-10-CM | POA: Diagnosis not present

## 2022-07-11 DIAGNOSIS — N186 End stage renal disease: Secondary | ICD-10-CM | POA: Diagnosis not present

## 2022-07-11 DIAGNOSIS — Z992 Dependence on renal dialysis: Secondary | ICD-10-CM | POA: Diagnosis not present

## 2022-07-11 DIAGNOSIS — N2581 Secondary hyperparathyroidism of renal origin: Secondary | ICD-10-CM | POA: Diagnosis not present

## 2022-07-13 DIAGNOSIS — Z992 Dependence on renal dialysis: Secondary | ICD-10-CM | POA: Diagnosis not present

## 2022-07-13 DIAGNOSIS — E1122 Type 2 diabetes mellitus with diabetic chronic kidney disease: Secondary | ICD-10-CM | POA: Diagnosis not present

## 2022-07-13 DIAGNOSIS — N2581 Secondary hyperparathyroidism of renal origin: Secondary | ICD-10-CM | POA: Diagnosis not present

## 2022-07-13 DIAGNOSIS — N186 End stage renal disease: Secondary | ICD-10-CM | POA: Diagnosis not present

## 2022-07-15 DIAGNOSIS — E1122 Type 2 diabetes mellitus with diabetic chronic kidney disease: Secondary | ICD-10-CM | POA: Diagnosis not present

## 2022-07-15 DIAGNOSIS — N2581 Secondary hyperparathyroidism of renal origin: Secondary | ICD-10-CM | POA: Diagnosis not present

## 2022-07-15 DIAGNOSIS — Z992 Dependence on renal dialysis: Secondary | ICD-10-CM | POA: Diagnosis not present

## 2022-07-15 DIAGNOSIS — N186 End stage renal disease: Secondary | ICD-10-CM | POA: Diagnosis not present

## 2022-07-18 ENCOUNTER — Telehealth: Payer: Self-pay

## 2022-07-18 DIAGNOSIS — E1122 Type 2 diabetes mellitus with diabetic chronic kidney disease: Secondary | ICD-10-CM | POA: Diagnosis not present

## 2022-07-18 DIAGNOSIS — N186 End stage renal disease: Secondary | ICD-10-CM | POA: Diagnosis not present

## 2022-07-18 DIAGNOSIS — Z992 Dependence on renal dialysis: Secondary | ICD-10-CM | POA: Diagnosis not present

## 2022-07-18 DIAGNOSIS — N2581 Secondary hyperparathyroidism of renal origin: Secondary | ICD-10-CM | POA: Diagnosis not present

## 2022-07-18 NOTE — Telephone Encounter (Signed)
Pt is c/o right hand numbness, that mostly occurs when his arm is at rest and upon waking in the AM.  States that the numbness is intermittent and begins in the middle of his forearm and extends into the hand.  Today when he was having dialysis, the numbness occurred again.  Pt denies CP, SOB, changes to the color of the skin, speech problems and one sided weakness.  He is still able to grip his walker, but his hand tends to go numb when he is doing this as well.  Pt's spouse is concerned about a possible blood clot.  Please advise.  Charyl Bigger, CMA

## 2022-07-19 ENCOUNTER — Telehealth (INDEPENDENT_AMBULATORY_CARE_PROVIDER_SITE_OTHER): Payer: Medicare Other | Admitting: Family Medicine

## 2022-07-19 DIAGNOSIS — R200 Anesthesia of skin: Secondary | ICD-10-CM | POA: Diagnosis not present

## 2022-07-19 NOTE — Progress Notes (Signed)
   Virtual Visit via Telephone Note  I connected with Louis [name] on 07/19/22 at  4:30 PM EDT by telephone and verified that I am speaking with the correct person using two identifiers.   I discussed the limitations, risks, security and privacy concerns of performing an evaluation and management service by telephone and the availability of in person appointments. I also discussed with the patient that there may be a patient responsible charge related to this service. The patient expressed understanding and agreed to proceed.  Patient location: At home Provider loccation: In office   Subjective:    CC:  No chief complaint on file.   HPI:  Right hand started going to sleep about a week ago.  Had finger pain for awhile. Pt denies CP, SOB, changes to the color of the skin, speech problems and one sided weakness.  He is still able to grip his walker, but his hand tends to go numb when he is doing this as well.  no neck pain. No swelling. Happens when goes to dialysis and is laying there for an extended period of time.. Not happening at night.  Lasted hours. Yesterday lasted all day long.  No discomfort in his neck or shoulder or elbow.   Past medical history, Surgical history, Family history not pertinant except as noted below, Social history, Allergies, and medications have been entered into the medical record, reviewed, and corrections made.   Review of Systems: No fevers, chills, night sweats, weight loss, chest pain, or shortness of breath.   Objective:    General: Speaking clearly in complete sentences without any shortness of breath.  Alert and oriented x3.  Normal judgment. No apparent acute distress.    Impression and Recommendations:    Problem List Items Addressed This Visit   None Visit Diagnoses     Numbness of right hand    -  Primary       Right hand numbness-I had him do a Phalen's over the phone and he said it did cause his hand to go numb while he was doing  it.  So I do think this is most consistent with carpal tunnel.  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  We will treat with cock-up splint.  We will schedule nurse visit on Friday for him to come by and be fitted.  He has dialysis tomorrow so he will be able to come in tomorrow.  He also has some forms to drop off to see if we can get his anticoagulant covered at a lower cost.  No orders of the defined types were placed in this encounter.   No orders of the defined types were placed in this encounter.    I discussed the assessment and treatment plan with the patient. The patient was provided an opportunity to ask questions and all were answered. The patient agreed with the plan and demonstrated an understanding of the instructions.   The patient was advised to call back or seek an in-person evaluation if the symptoms worsen or if the condition fails to improve as anticipated.  I provided 10 minutes of non-face-to-face time during this encounter.   [name], MD

## 2022-07-19 NOTE — Telephone Encounter (Signed)
See if we can put on for virtual now.

## 2022-07-20 DIAGNOSIS — E1122 Type 2 diabetes mellitus with diabetic chronic kidney disease: Secondary | ICD-10-CM | POA: Diagnosis not present

## 2022-07-20 DIAGNOSIS — N2581 Secondary hyperparathyroidism of renal origin: Secondary | ICD-10-CM | POA: Diagnosis not present

## 2022-07-20 DIAGNOSIS — N186 End stage renal disease: Secondary | ICD-10-CM | POA: Diagnosis not present

## 2022-07-20 DIAGNOSIS — Z992 Dependence on renal dialysis: Secondary | ICD-10-CM | POA: Diagnosis not present

## 2022-07-21 ENCOUNTER — Ambulatory Visit (INDEPENDENT_AMBULATORY_CARE_PROVIDER_SITE_OTHER): Payer: Medicare Other | Admitting: Family Medicine

## 2022-07-21 ENCOUNTER — Telehealth: Payer: Self-pay | Admitting: Orthopedic Surgery

## 2022-07-21 DIAGNOSIS — N186 End stage renal disease: Secondary | ICD-10-CM | POA: Diagnosis not present

## 2022-07-21 DIAGNOSIS — Z992 Dependence on renal dialysis: Secondary | ICD-10-CM | POA: Diagnosis not present

## 2022-07-21 DIAGNOSIS — E1122 Type 2 diabetes mellitus with diabetic chronic kidney disease: Secondary | ICD-10-CM | POA: Diagnosis not present

## 2022-07-21 DIAGNOSIS — R200 Anesthesia of skin: Secondary | ICD-10-CM

## 2022-07-21 NOTE — Progress Notes (Unsigned)
Per provider's request - patient came in for a right cock-up splint. Patient informed to contact the Triage department if splint does not help in reducing numbness on hand. Patient agreeable with plan.

## 2022-07-21 NOTE — Telephone Encounter (Signed)
Can you please call and make an appt at the first available? He has not been in the office since 2022 and insurance will not honor our rx without eval in the office. Thanks!

## 2022-07-21 NOTE — Telephone Encounter (Signed)
Patient's wife [name] called asked if Dr Sharol Given would send  Rx to [facility] for a plate  for the patient so that the brace can fit in patient's shoe. [name] said nothing has changed and patient is doing well.  The number to contact patient is [phone]

## 2022-07-22 DIAGNOSIS — E1122 Type 2 diabetes mellitus with diabetic chronic kidney disease: Secondary | ICD-10-CM | POA: Diagnosis not present

## 2022-07-22 DIAGNOSIS — N2581 Secondary hyperparathyroidism of renal origin: Secondary | ICD-10-CM | POA: Diagnosis not present

## 2022-07-22 DIAGNOSIS — N186 End stage renal disease: Secondary | ICD-10-CM | POA: Diagnosis not present

## 2022-07-22 DIAGNOSIS — D509 Iron deficiency anemia, unspecified: Secondary | ICD-10-CM | POA: Diagnosis not present

## 2022-07-22 DIAGNOSIS — D631 Anemia in chronic kidney disease: Secondary | ICD-10-CM | POA: Diagnosis not present

## 2022-07-22 DIAGNOSIS — Z992 Dependence on renal dialysis: Secondary | ICD-10-CM | POA: Diagnosis not present

## 2022-07-24 NOTE — Progress Notes (Signed)
Agree with documentation as above.   Blayne Garlick, MD  

## 2022-07-25 DIAGNOSIS — N2581 Secondary hyperparathyroidism of renal origin: Secondary | ICD-10-CM | POA: Diagnosis not present

## 2022-07-25 DIAGNOSIS — D631 Anemia in chronic kidney disease: Secondary | ICD-10-CM | POA: Diagnosis not present

## 2022-07-25 DIAGNOSIS — Z992 Dependence on renal dialysis: Secondary | ICD-10-CM | POA: Diagnosis not present

## 2022-07-25 DIAGNOSIS — E1122 Type 2 diabetes mellitus with diabetic chronic kidney disease: Secondary | ICD-10-CM | POA: Diagnosis not present

## 2022-07-25 DIAGNOSIS — D509 Iron deficiency anemia, unspecified: Secondary | ICD-10-CM | POA: Diagnosis not present

## 2022-07-25 DIAGNOSIS — N186 End stage renal disease: Secondary | ICD-10-CM | POA: Diagnosis not present

## 2022-07-27 DIAGNOSIS — Z992 Dependence on renal dialysis: Secondary | ICD-10-CM | POA: Diagnosis not present

## 2022-07-27 DIAGNOSIS — E1122 Type 2 diabetes mellitus with diabetic chronic kidney disease: Secondary | ICD-10-CM | POA: Diagnosis not present

## 2022-07-27 DIAGNOSIS — N186 End stage renal disease: Secondary | ICD-10-CM | POA: Diagnosis not present

## 2022-07-27 DIAGNOSIS — N2581 Secondary hyperparathyroidism of renal origin: Secondary | ICD-10-CM | POA: Diagnosis not present

## 2022-07-27 DIAGNOSIS — D509 Iron deficiency anemia, unspecified: Secondary | ICD-10-CM | POA: Diagnosis not present

## 2022-07-27 DIAGNOSIS — D631 Anemia in chronic kidney disease: Secondary | ICD-10-CM | POA: Diagnosis not present

## 2022-07-29 DIAGNOSIS — N2581 Secondary hyperparathyroidism of renal origin: Secondary | ICD-10-CM | POA: Diagnosis not present

## 2022-07-29 DIAGNOSIS — N186 End stage renal disease: Secondary | ICD-10-CM | POA: Diagnosis not present

## 2022-07-29 DIAGNOSIS — Z992 Dependence on renal dialysis: Secondary | ICD-10-CM | POA: Diagnosis not present

## 2022-07-29 DIAGNOSIS — E1122 Type 2 diabetes mellitus with diabetic chronic kidney disease: Secondary | ICD-10-CM | POA: Diagnosis not present

## 2022-07-29 DIAGNOSIS — D631 Anemia in chronic kidney disease: Secondary | ICD-10-CM | POA: Diagnosis not present

## 2022-07-29 DIAGNOSIS — D509 Iron deficiency anemia, unspecified: Secondary | ICD-10-CM | POA: Diagnosis not present

## 2022-07-31 DIAGNOSIS — E11622 Type 2 diabetes mellitus with other skin ulcer: Secondary | ICD-10-CM | POA: Diagnosis not present

## 2022-08-01 DIAGNOSIS — D509 Iron deficiency anemia, unspecified: Secondary | ICD-10-CM | POA: Diagnosis not present

## 2022-08-01 DIAGNOSIS — N186 End stage renal disease: Secondary | ICD-10-CM | POA: Diagnosis not present

## 2022-08-01 DIAGNOSIS — E1122 Type 2 diabetes mellitus with diabetic chronic kidney disease: Secondary | ICD-10-CM | POA: Diagnosis not present

## 2022-08-01 DIAGNOSIS — N2581 Secondary hyperparathyroidism of renal origin: Secondary | ICD-10-CM | POA: Diagnosis not present

## 2022-08-01 DIAGNOSIS — D631 Anemia in chronic kidney disease: Secondary | ICD-10-CM | POA: Diagnosis not present

## 2022-08-01 DIAGNOSIS — Z992 Dependence on renal dialysis: Secondary | ICD-10-CM | POA: Diagnosis not present

## 2022-08-03 DIAGNOSIS — E1122 Type 2 diabetes mellitus with diabetic chronic kidney disease: Secondary | ICD-10-CM | POA: Diagnosis not present

## 2022-08-03 DIAGNOSIS — N2581 Secondary hyperparathyroidism of renal origin: Secondary | ICD-10-CM | POA: Diagnosis not present

## 2022-08-03 DIAGNOSIS — D509 Iron deficiency anemia, unspecified: Secondary | ICD-10-CM | POA: Diagnosis not present

## 2022-08-03 DIAGNOSIS — N186 End stage renal disease: Secondary | ICD-10-CM | POA: Diagnosis not present

## 2022-08-03 DIAGNOSIS — D631 Anemia in chronic kidney disease: Secondary | ICD-10-CM | POA: Diagnosis not present

## 2022-08-03 DIAGNOSIS — Z992 Dependence on renal dialysis: Secondary | ICD-10-CM | POA: Diagnosis not present

## 2022-08-05 DIAGNOSIS — N2581 Secondary hyperparathyroidism of renal origin: Secondary | ICD-10-CM | POA: Diagnosis not present

## 2022-08-05 DIAGNOSIS — N186 End stage renal disease: Secondary | ICD-10-CM | POA: Diagnosis not present

## 2022-08-05 DIAGNOSIS — D631 Anemia in chronic kidney disease: Secondary | ICD-10-CM | POA: Diagnosis not present

## 2022-08-05 DIAGNOSIS — Z992 Dependence on renal dialysis: Secondary | ICD-10-CM | POA: Diagnosis not present

## 2022-08-05 DIAGNOSIS — D509 Iron deficiency anemia, unspecified: Secondary | ICD-10-CM | POA: Diagnosis not present

## 2022-08-05 DIAGNOSIS — E1122 Type 2 diabetes mellitus with diabetic chronic kidney disease: Secondary | ICD-10-CM | POA: Diagnosis not present

## 2022-08-07 ENCOUNTER — Encounter: Payer: Self-pay | Admitting: Orthopedic Surgery

## 2022-08-07 ENCOUNTER — Ambulatory Visit (INDEPENDENT_AMBULATORY_CARE_PROVIDER_SITE_OTHER): Payer: Medicare Other | Admitting: Orthopedic Surgery

## 2022-08-07 DIAGNOSIS — E11610 Type 2 diabetes mellitus with diabetic neuropathic arthropathy: Secondary | ICD-10-CM

## 2022-08-07 DIAGNOSIS — I2510 Atherosclerotic heart disease of native coronary artery without angina pectoris: Secondary | ICD-10-CM

## 2022-08-07 DIAGNOSIS — M25561 Pain in right knee: Secondary | ICD-10-CM

## 2022-08-07 DIAGNOSIS — G8929 Other chronic pain: Secondary | ICD-10-CM

## 2022-08-07 DIAGNOSIS — I872 Venous insufficiency (chronic) (peripheral): Secondary | ICD-10-CM

## 2022-08-07 MED ORDER — LIDOCAINE HCL (PF) 1 % IJ SOLN
5.0000 mL | INTRAMUSCULAR | Status: AC | PRN
  Administered 2022-08-07: 5 mL

## 2022-08-07 MED ORDER — METHYLPREDNISOLONE ACETATE 40 MG/ML IJ SUSP
40.0000 mg | INTRAMUSCULAR | Status: AC | PRN
  Administered 2022-08-07: 40 mg via INTRA_ARTICULAR

## 2022-08-07 NOTE — Progress Notes (Signed)
Office Visit Note   Patient: Louis [name]           Date of Birth: 09-17-1946           MRN: [id number] Visit Date: 08/07/2022              Requested by: [name], MD [place],  [place] [address] PCP: [name], MD  Chief Complaint  Patient presents with   Right Knee - Pain   Right Foot - Pain      HPI: Patient is a 76 year old gentleman who presents for 3 separate issues.  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  #2 osteoarthritis right knee with mechanical symptoms with giving way.  #3 venous swelling of right leg.  Assessment & Plan: Visit Diagnoses:  1. Chronic pain of right knee   2. 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  Right knee was injected he was provided a prescription for new double upright brace from [employer].  Follow-Up Instructions: Return if symptoms worsen or fail to improve.   Ortho Exam  Patient is alert, oriented, no adenopathy, well-dressed, normal affect, normal respiratory effort. Examination patient has venous swelling of the right leg which is improving.  Calf currently measures 36 cm in circumference this is consistent with a size large sock he is currently an extra-large socks.  Examination of the right knee there is mild swelling there is no cellulitis Clauser cruciates are stable he is tender to palpation of the medial joint line.  Patient has instability with ambulation with a double upright brace on the right.  Imaging: No results found. No images are attached to the encounter.  Labs: Lab Results  Component Value Date   HGBA1C 6.2 03/12/2022   HGBA1C 6.6 (A) 03/18/2021   HGBA1C 5.8 (A) 12/17/2020   ESRSEDRATE 81 (H) 02/17/2016   ESRSEDRATE 71 (H) 04/20/2015   CRP 7.0 (H) 02/17/2016   LABURIC 5.2 09/20/2020    LABURIC 2.9 (L) 04/29/2019   LABURIC 5.7 12/12/2018   REPTSTATUS 10/31/2019 FINAL 10/27/2019   GRAMSTAIN  10/27/2019    FEW WBC PRESENT, PREDOMINANTLY MONONUCLEAR NO ORGANISMS SEEN    CULT  10/27/2019    NO GROWTH 3 DAYS Performed at [facility], [address] [place], [place] [address]    LABORGA ENTEROCOCCUS SPECIES 01/28/2016     Lab Results  Component Value Date   ALBUMIN 3.7 03/12/2022   ALBUMIN 2.9 (A) 09/09/2020   ALBUMIN 3.1 (L) 10/27/2019    Lab Results  Component Value Date   MG 2.2 12/17/2018   MG 2.4 12/16/2018   MG 2.0 02/02/2017   Lab Results  Component Value Date   VD25OH 55 02/17/2016    No results found for: "PREALBUMIN"    Latest Ref Rng & Units 10/27/2020    7:16 AM 09/09/2020   12:00 AM 09/01/2020    7:00 AM  CBC EXTENDED  WBC   11.4       RBC 3.87 - 5.11  3.48       Hemoglobin 13.0 - 17.0 g/dL 12.9  11.3     14.6   HCT 39.0 - 52.0 % 38.0  35     43.0      This result is from an external source.  There is no height or weight on file to calculate BMI.  Orders:  No orders of the defined types were placed in this encounter.  No orders of the defined types were placed in this encounter.    Procedures: Large Joint Inj: R knee on 08/07/2022 12:05 PM Indications: pain and diagnostic evaluation Details: 22 G 1.5 in needle, anteromedial approach  Arthrogram: No  Medications: 5 mL lidocaine (PF) 1 %; 40 mg methylPREDNISolone acetate 40 MG/ML Outcome: tolerated well, no immediate complications Procedure, treatment alternatives, risks and benefits explained, specific risks discussed. Consent was given by the patient. Immediately prior to procedure a time out was called to verify the correct patient, procedure, equipment, support staff and site/side marked as required. Patient was prepped and draped in the usual sterile fashion.      Clinical Data: No additional findings.  ROS:  All other systems negative, except as noted in  the HPI. Review of Systems  Objective: Vital Signs: There were no vitals taken for this visit.  Specialty Comments:  No specialty comments available.  PMFS History: Patient Active Problem List   Diagnosis Date Noted   Diabetic ulcer of left heel associated with diabetes mellitus due to underlying condition ([place]) 10/19/2021   History of Clostridioides difficile colitis 10/19/2021   Gait instability 11/17/2020   Allergy, unspecified, initial encounter 07/14/2020   Trigger finger, right middle finger 02/03/2020   Pain in both hands 02/03/2020   Anticoagulant long-term use 01/06/2020   Iron deficiency anemia, unspecified 09/01/2019   Other long term (current) drug therapy 08/27/2019   Liver disease, unspecified 08/27/2019   Internal hemorrhoids 06/05/2019   Poor mobility 05/02/2019   Hypertensive left ventricular hypertrophy, without heart failure 05/02/2019   Secondary hyperparathyroidism of renal origin ([place]) 01/25/2019   Atrial fibrillation ([place]) 12/23/2018   Other specified coagulation defects ([place]) 12/23/2018   Mechanical complication of cardiovascular device 12/23/2018   Anemia in chronic kidney disease 12/20/2018   Anasarca 12/14/2018   SOB (shortness of breath) on exertion 12/12/2018   Bilateral pseudophakia 02/27/2018   Venous insufficiency (chronic) (peripheral) 09/06/2017   Diabetic peripheral neuropathy ([place]) 06/12/2017   Charcot gait 02/02/2017   Charcot's joint 02/02/2017   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 ([place]) 01/08/2017   Diabetic polyneuropathy associated with type 2 diabetes mellitus ([place]) 12/25/2016   Idiopathic chronic venous hypertension of both lower extremities with inflammation 12/25/2016   Combined forms of age-related cataract of both eyes 10/19/2016   Cataract, right 05/18/2016   Right cervical radiculopathy 05/12/2016   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  ([place]) 10/12/2015   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 06/17/2015   Gout 04/20/2015   Osteoarthritis of both acromioclavicular joints 12/03/2014   Fracture of humerus, left, closed 11/17/2014   Unstable angina pectoris ([place]) 10/05/2014   History of colonic polyps 01/28/2014   Bilateral carpal tunnel syndrome 01/09/2014   Macular edema, diabetic ([place]) 10/31/2013   Obesity (BMI 30-39.9) 07/31/2013   Astigmatism 01/11/2012   Presbyopia 01/11/2012   Atherosclerosis of native coronary artery of native heart with angina pectoris ([place]) 05/17/2011   BMI 32.0-32.9,adult 03/01/2011   Bradycardia 02/08/2011   Right knee pain 02/08/2011   Hyperlipidemia    RBBB 02/01/2011   ESRD (end stage renal disease) ([place]) 10/23/2006   Proteinuria 10/23/2006   Obstructive sleep apnea 10/05/2006   CKD stage 5 due to type 2 diabetes mellitus ([place]) 08/28/2006   HYPERTENSION, BENIGN  SYSTEMIC 08/28/2006   Past Medical History:  Diagnosis Date   Arthritis    Brittle bones    per pt, has soft bones in right foot/wears boot cast!   CAD (coronary artery disease)    Cancer ([place])    skin cancer on arm   Cataract    Bil/ surg scheduled for right eye 01/18/17/ left eye 02/08/17   Charcot ankle, right 2019   CHF (congestive heart failure) ([place]) 2015   Diabetes mellitus    Type 2   Heart failure, diastolic ([place])    History of kidney stones    Hyperlipidemia    Hypertension    Macular degeneration disease    Macular edema 2014   OSA on CPAP    Paroxysmal atrial fibrillation (HCC)    Personal history of colonic polyps - adenomas 01/28/2014   Renal insufficiency    [facility]Th/[facility] Dialysis   Shortness of breath dyspnea    Syncope and collapse     Family History  Problem Relation Age of Onset   Diabetes Mother    Kidney disease Mother    Diabetes Father    Coronary artery disease Father    Diabetes Brother    Diabetes Sister    Prostate cancer Maternal Uncle    Diabetes Maternal Grandmother     Cancer Paternal Grandmother        unknown   Heart attack Paternal Grandfather    Colon cancer Neg Hx     Past Surgical History:  Procedure Laterality Date   AV FISTULA PLACEMENT Left 09/01/2020   Procedure: LEFT ARM ARTERIOVENOUS (AV) FISTULA;  Surgeon: Waynetta Sandy, MD;  Location: [facility];  Service: Vascular;  Laterality: Left;   [place] Left 10/27/2020   Procedure: LEFT SECOND STAGE BASILIC VEIN FISTULA TRANSPOSITION;  Surgeon: Waynetta Sandy, MD;  Location: [facility];  Service: Vascular;  Laterality: Left;   CARPAL TUNNEL RELEASE     left hand   COLONOSCOPY     INTRAVASCULAR PRESSURE WIRE/FFR STUDY N/A 12/18/2018   Procedure: INTRAVASCULAR PRESSURE WIRE/FFR STUDY;  Surgeon: Wellington Hampshire, MD;  Location: [facility];  Service: Cardiovascular;  Laterality: N/A;   IR FLUORO GUIDE CV LINE RIGHT  12/16/2018   IR US GUIDE VASC ACCESS RIGHT  12/16/2018   KNEE ARTHROSCOPY Right 09/13/2016   [facility], Dr. Dorna Leitz   PILONIDAL CYST EXCISION     RIGHT/LEFT HEART CATH AND CORONARY ANGIOGRAPHY N/A 12/18/2018   Procedure: RIGHT/LEFT HEART CATH AND CORONARY ANGIOGRAPHY;  Surgeon: Wellington Hampshire, MD;  Location: [facility];  Service: Cardiovascular;  Laterality: N/A;   Social History   Occupational History   Occupation: Warehouse    Comment: retired  Tobacco Use   Smoking status: Former    Packs/day: 0.50    Years: 20.00    Total pack years: 10.00    Types: Cigarettes    Quit date: 03/06/1977    Years since quitting: 45.4   Smokeless tobacco: Never  Vaping Use   Vaping Use: Never used  Substance and Sexual Activity   Alcohol use: No   Drug use: No   Sexual activity: Not Currently

## 2022-08-08 DIAGNOSIS — D631 Anemia in chronic kidney disease: Secondary | ICD-10-CM | POA: Diagnosis not present

## 2022-08-08 DIAGNOSIS — D509 Iron deficiency anemia, unspecified: Secondary | ICD-10-CM | POA: Diagnosis not present

## 2022-08-08 DIAGNOSIS — N2581 Secondary hyperparathyroidism of renal origin: Secondary | ICD-10-CM | POA: Diagnosis not present

## 2022-08-08 DIAGNOSIS — Z992 Dependence on renal dialysis: Secondary | ICD-10-CM | POA: Diagnosis not present

## 2022-08-08 DIAGNOSIS — N186 End stage renal disease: Secondary | ICD-10-CM | POA: Diagnosis not present

## 2022-08-08 DIAGNOSIS — E1122 Type 2 diabetes mellitus with diabetic chronic kidney disease: Secondary | ICD-10-CM | POA: Diagnosis not present

## 2022-08-10 DIAGNOSIS — D631 Anemia in chronic kidney disease: Secondary | ICD-10-CM | POA: Diagnosis not present

## 2022-08-10 DIAGNOSIS — N186 End stage renal disease: Secondary | ICD-10-CM | POA: Diagnosis not present

## 2022-08-10 DIAGNOSIS — E1122 Type 2 diabetes mellitus with diabetic chronic kidney disease: Secondary | ICD-10-CM | POA: Diagnosis not present

## 2022-08-10 DIAGNOSIS — Z794 Long term (current) use of insulin: Secondary | ICD-10-CM | POA: Diagnosis not present

## 2022-08-10 DIAGNOSIS — Z992 Dependence on renal dialysis: Secondary | ICD-10-CM | POA: Diagnosis not present

## 2022-08-10 DIAGNOSIS — Z961 Presence of intraocular lens: Secondary | ICD-10-CM | POA: Diagnosis not present

## 2022-08-10 DIAGNOSIS — N2581 Secondary hyperparathyroidism of renal origin: Secondary | ICD-10-CM | POA: Diagnosis not present

## 2022-08-10 DIAGNOSIS — E113213 Type 2 diabetes mellitus with mild nonproliferative diabetic retinopathy with macular edema, bilateral: Secondary | ICD-10-CM | POA: Diagnosis not present

## 2022-08-10 DIAGNOSIS — D509 Iron deficiency anemia, unspecified: Secondary | ICD-10-CM | POA: Diagnosis not present

## 2022-08-11 DIAGNOSIS — L89322 Pressure ulcer of left buttock, stage 2: Secondary | ICD-10-CM | POA: Diagnosis not present

## 2022-08-11 DIAGNOSIS — L89312 Pressure ulcer of right buttock, stage 2: Secondary | ICD-10-CM | POA: Diagnosis not present

## 2022-08-12 DIAGNOSIS — D631 Anemia in chronic kidney disease: Secondary | ICD-10-CM | POA: Diagnosis not present

## 2022-08-12 DIAGNOSIS — D509 Iron deficiency anemia, unspecified: Secondary | ICD-10-CM | POA: Diagnosis not present

## 2022-08-12 DIAGNOSIS — N186 End stage renal disease: Secondary | ICD-10-CM | POA: Diagnosis not present

## 2022-08-12 DIAGNOSIS — N2581 Secondary hyperparathyroidism of renal origin: Secondary | ICD-10-CM | POA: Diagnosis not present

## 2022-08-12 DIAGNOSIS — E1122 Type 2 diabetes mellitus with diabetic chronic kidney disease: Secondary | ICD-10-CM | POA: Diagnosis not present

## 2022-08-12 DIAGNOSIS — Z992 Dependence on renal dialysis: Secondary | ICD-10-CM | POA: Diagnosis not present

## 2022-08-15 DIAGNOSIS — D509 Iron deficiency anemia, unspecified: Secondary | ICD-10-CM | POA: Diagnosis not present

## 2022-08-15 DIAGNOSIS — Z992 Dependence on renal dialysis: Secondary | ICD-10-CM | POA: Diagnosis not present

## 2022-08-15 DIAGNOSIS — E1122 Type 2 diabetes mellitus with diabetic chronic kidney disease: Secondary | ICD-10-CM | POA: Diagnosis not present

## 2022-08-15 DIAGNOSIS — N2581 Secondary hyperparathyroidism of renal origin: Secondary | ICD-10-CM | POA: Diagnosis not present

## 2022-08-15 DIAGNOSIS — D631 Anemia in chronic kidney disease: Secondary | ICD-10-CM | POA: Diagnosis not present

## 2022-08-15 DIAGNOSIS — N186 End stage renal disease: Secondary | ICD-10-CM | POA: Diagnosis not present

## 2022-08-17 DIAGNOSIS — N186 End stage renal disease: Secondary | ICD-10-CM | POA: Diagnosis not present

## 2022-08-17 DIAGNOSIS — D509 Iron deficiency anemia, unspecified: Secondary | ICD-10-CM | POA: Diagnosis not present

## 2022-08-17 DIAGNOSIS — Z992 Dependence on renal dialysis: Secondary | ICD-10-CM | POA: Diagnosis not present

## 2022-08-17 DIAGNOSIS — D631 Anemia in chronic kidney disease: Secondary | ICD-10-CM | POA: Diagnosis not present

## 2022-08-17 DIAGNOSIS — N2581 Secondary hyperparathyroidism of renal origin: Secondary | ICD-10-CM | POA: Diagnosis not present

## 2022-08-17 DIAGNOSIS — E1122 Type 2 diabetes mellitus with diabetic chronic kidney disease: Secondary | ICD-10-CM | POA: Diagnosis not present

## 2022-08-19 DIAGNOSIS — D631 Anemia in chronic kidney disease: Secondary | ICD-10-CM | POA: Diagnosis not present

## 2022-08-19 DIAGNOSIS — N186 End stage renal disease: Secondary | ICD-10-CM | POA: Diagnosis not present

## 2022-08-19 DIAGNOSIS — D509 Iron deficiency anemia, unspecified: Secondary | ICD-10-CM | POA: Diagnosis not present

## 2022-08-19 DIAGNOSIS — N2581 Secondary hyperparathyroidism of renal origin: Secondary | ICD-10-CM | POA: Diagnosis not present

## 2022-08-19 DIAGNOSIS — Z992 Dependence on renal dialysis: Secondary | ICD-10-CM | POA: Diagnosis not present

## 2022-08-19 DIAGNOSIS — E1122 Type 2 diabetes mellitus with diabetic chronic kidney disease: Secondary | ICD-10-CM | POA: Diagnosis not present

## 2022-08-22 DIAGNOSIS — N186 End stage renal disease: Secondary | ICD-10-CM | POA: Diagnosis not present

## 2022-08-22 DIAGNOSIS — N2581 Secondary hyperparathyroidism of renal origin: Secondary | ICD-10-CM | POA: Diagnosis not present

## 2022-08-22 DIAGNOSIS — D509 Iron deficiency anemia, unspecified: Secondary | ICD-10-CM | POA: Diagnosis not present

## 2022-08-22 DIAGNOSIS — Z992 Dependence on renal dialysis: Secondary | ICD-10-CM | POA: Diagnosis not present

## 2022-08-24 ENCOUNTER — Telehealth: Payer: Self-pay

## 2022-08-24 DIAGNOSIS — Z992 Dependence on renal dialysis: Secondary | ICD-10-CM | POA: Diagnosis not present

## 2022-08-24 DIAGNOSIS — D509 Iron deficiency anemia, unspecified: Secondary | ICD-10-CM | POA: Diagnosis not present

## 2022-08-24 DIAGNOSIS — N2581 Secondary hyperparathyroidism of renal origin: Secondary | ICD-10-CM | POA: Diagnosis not present

## 2022-08-24 DIAGNOSIS — N186 End stage renal disease: Secondary | ICD-10-CM | POA: Diagnosis not present

## 2022-08-24 NOTE — Telephone Encounter (Signed)
Iit is usually used to help keep BP up. So maybe she wasn't to see if this helps keep BP from bottoming out after dialysis so they can pull more fluid during dialysis. I have had a couple of patients on ti and they did well and used it for just a period of time.is there a particular S.E. she is worried about. She only worrisome effect would be rash.

## 2022-08-24 NOTE — Telephone Encounter (Signed)
[name] called and states Eduard Clos was having some swelling and Dr [name]) prescribed Midodrine. [name] read the drug information sheet and is worried this may not be the best medication for Charlie. She is wanting Dr Gardiner Ramus thoughts on this medication.

## 2022-08-25 ENCOUNTER — Telehealth: Payer: Self-pay | Admitting: Cardiology

## 2022-08-25 NOTE — Telephone Encounter (Signed)
Pt c/o medication issue:  1. Name of Medication:   midodrine  2. How are you currently taking this medication (dosage and times per day)?  Not taking yet  3. Are you having a reaction (difficulty breathing--STAT)? No  4. What is your medication issue?   Wife called stating patient was recently prescribed this medication and she would like to discuss this medication change.  Wife stated  patient's blood pressure goes down when he is on dialysis and she would like to take him off the isosorbide instead of making this medication change.  Wife stated the patient already stops taking the isosorbide prior to doing dialysis.

## 2022-08-25 NOTE — Telephone Encounter (Signed)
Called patient wife, advised of message from MD.  Patient wife verbalized understanding.

## 2022-08-25 NOTE — Telephone Encounter (Signed)
Patient wife called in, stating that her husband is a dialysis patient (Tuesday, Thursday and Saturday). They have had issues with his BP dropping during these visits, and nephrology has her hold his isosorbide the days of dialysis. However it is still continuing to drop. She states nephrology team placed him on Midodrine (patient states 10 mg) to be taken half an hour before dialysis. She states she read the information for this and it states not to take if you have heart or kidney problems. She is very concerned with her husband taking this medication and wanted recommendations from his cardiologist if this was safe or not. She also questions if other things could be done, she request something like stopping the imdur. I did advise not to do anything until recommended by MD. Patient wife verbalized understanding, will route to MD to review.   Thanks!

## 2022-08-26 DIAGNOSIS — N2581 Secondary hyperparathyroidism of renal origin: Secondary | ICD-10-CM | POA: Diagnosis not present

## 2022-08-26 DIAGNOSIS — Z992 Dependence on renal dialysis: Secondary | ICD-10-CM | POA: Diagnosis not present

## 2022-08-26 DIAGNOSIS — D509 Iron deficiency anemia, unspecified: Secondary | ICD-10-CM | POA: Diagnosis not present

## 2022-08-26 DIAGNOSIS — N186 End stage renal disease: Secondary | ICD-10-CM | POA: Diagnosis not present

## 2022-08-28 ENCOUNTER — Inpatient Hospital Stay (HOSPITAL_COMMUNITY)
Admission: EM | Admit: 2022-08-28 | Discharge: 2022-09-14 | DRG: 628 | Disposition: A | Discharge status: Skilled Nursing Facility | Payer: Medicare Other | Attending: Internal Medicine | Admitting: Internal Medicine

## 2022-08-28 ENCOUNTER — Other Ambulatory Visit: Payer: Self-pay

## 2022-08-28 ENCOUNTER — Ambulatory Visit (INDEPENDENT_AMBULATORY_CARE_PROVIDER_SITE_OTHER): Payer: Medicare Other | Admitting: Family Medicine

## 2022-08-28 ENCOUNTER — Emergency Department (HOSPITAL_COMMUNITY): Payer: Medicare Other

## 2022-08-28 ENCOUNTER — Ambulatory Visit (INDEPENDENT_AMBULATORY_CARE_PROVIDER_SITE_OTHER): Payer: Medicare Other

## 2022-08-28 ENCOUNTER — Encounter (HOSPITAL_COMMUNITY): Payer: Self-pay | Admitting: *Deleted

## 2022-08-28 VITALS — BP 96/58 | HR 53 | Ht 67.0 in

## 2022-08-28 DIAGNOSIS — M86341 Chronic multifocal osteomyelitis, right hand: Secondary | ICD-10-CM | POA: Diagnosis present

## 2022-08-28 DIAGNOSIS — E11622 Type 2 diabetes mellitus with other skin ulcer: Secondary | ICD-10-CM | POA: Diagnosis present

## 2022-08-28 DIAGNOSIS — R0600 Dyspnea, unspecified: Secondary | ICD-10-CM | POA: Diagnosis not present

## 2022-08-28 DIAGNOSIS — A499 Bacterial infection, unspecified: Secondary | ICD-10-CM

## 2022-08-28 DIAGNOSIS — E669 Obesity, unspecified: Secondary | ICD-10-CM | POA: Diagnosis present

## 2022-08-28 DIAGNOSIS — M19042 Primary osteoarthritis, left hand: Secondary | ICD-10-CM | POA: Diagnosis not present

## 2022-08-28 DIAGNOSIS — M86142 Other acute osteomyelitis, left hand: Secondary | ICD-10-CM | POA: Diagnosis not present

## 2022-08-28 DIAGNOSIS — E7800 Pure hypercholesterolemia, unspecified: Secondary | ICD-10-CM | POA: Diagnosis not present

## 2022-08-28 DIAGNOSIS — Z87891 Personal history of nicotine dependence: Secondary | ICD-10-CM | POA: Diagnosis not present

## 2022-08-28 DIAGNOSIS — M86342 Chronic multifocal osteomyelitis, left hand: Secondary | ICD-10-CM | POA: Diagnosis present

## 2022-08-28 DIAGNOSIS — I4891 Unspecified atrial fibrillation: Secondary | ICD-10-CM | POA: Diagnosis not present

## 2022-08-28 DIAGNOSIS — M6281 Muscle weakness (generalized): Secondary | ICD-10-CM | POA: Diagnosis not present

## 2022-08-28 DIAGNOSIS — E8809 Other disorders of plasma-protein metabolism, not elsewhere classified: Secondary | ICD-10-CM | POA: Diagnosis present

## 2022-08-28 DIAGNOSIS — Z7401 Bed confinement status: Secondary | ICD-10-CM | POA: Diagnosis not present

## 2022-08-28 DIAGNOSIS — Z91040 Latex allergy status: Secondary | ICD-10-CM

## 2022-08-28 DIAGNOSIS — Z89012 Acquired absence of left thumb: Secondary | ICD-10-CM | POA: Diagnosis not present

## 2022-08-28 DIAGNOSIS — I4821 Permanent atrial fibrillation: Secondary | ICD-10-CM | POA: Diagnosis present

## 2022-08-28 DIAGNOSIS — T3695XA Adverse effect of unspecified systemic antibiotic, initial encounter: Secondary | ICD-10-CM | POA: Diagnosis not present

## 2022-08-28 DIAGNOSIS — K219 Gastro-esophageal reflux disease without esophagitis: Secondary | ICD-10-CM | POA: Diagnosis present

## 2022-08-28 DIAGNOSIS — A480 Gas gangrene: Secondary | ICD-10-CM | POA: Diagnosis present

## 2022-08-28 DIAGNOSIS — Z23 Encounter for immunization: Secondary | ICD-10-CM | POA: Diagnosis not present

## 2022-08-28 DIAGNOSIS — Z6836 Body mass index (BMI) 36.0-36.9, adult: Secondary | ICD-10-CM

## 2022-08-28 DIAGNOSIS — L03012 Cellulitis of left finger: Secondary | ICD-10-CM | POA: Diagnosis present

## 2022-08-28 DIAGNOSIS — D631 Anemia in chronic kidney disease: Secondary | ICD-10-CM | POA: Diagnosis present

## 2022-08-28 DIAGNOSIS — N186 End stage renal disease: Secondary | ICD-10-CM | POA: Diagnosis present

## 2022-08-28 DIAGNOSIS — S61002A Unspecified open wound of left thumb without damage to nail, initial encounter: Secondary | ICD-10-CM | POA: Diagnosis not present

## 2022-08-28 DIAGNOSIS — L98499 Non-pressure chronic ulcer of skin of other sites with unspecified severity: Secondary | ICD-10-CM | POA: Diagnosis not present

## 2022-08-28 DIAGNOSIS — E1152 Type 2 diabetes mellitus with diabetic peripheral angiopathy with gangrene: Secondary | ICD-10-CM | POA: Diagnosis not present

## 2022-08-28 DIAGNOSIS — Z841 Family history of disorders of kidney and ureter: Secondary | ICD-10-CM

## 2022-08-28 DIAGNOSIS — B9562 Methicillin resistant Staphylococcus aureus infection as the cause of diseases classified elsewhere: Secondary | ICD-10-CM | POA: Diagnosis present

## 2022-08-28 DIAGNOSIS — K257 Chronic gastric ulcer without hemorrhage or perforation: Secondary | ICD-10-CM | POA: Diagnosis present

## 2022-08-28 DIAGNOSIS — M8618 Other acute osteomyelitis, other site: Secondary | ICD-10-CM | POA: Diagnosis not present

## 2022-08-28 DIAGNOSIS — K224 Dyskinesia of esophagus: Secondary | ICD-10-CM | POA: Diagnosis not present

## 2022-08-28 DIAGNOSIS — Z8619 Personal history of other infectious and parasitic diseases: Secondary | ICD-10-CM

## 2022-08-28 DIAGNOSIS — Z7901 Long term (current) use of anticoagulants: Secondary | ICD-10-CM

## 2022-08-28 DIAGNOSIS — M869 Osteomyelitis, unspecified: Secondary | ICD-10-CM | POA: Diagnosis not present

## 2022-08-28 DIAGNOSIS — Z881 Allergy status to other antibiotic agents status: Secondary | ICD-10-CM

## 2022-08-28 DIAGNOSIS — I509 Heart failure, unspecified: Secondary | ICD-10-CM | POA: Diagnosis not present

## 2022-08-28 DIAGNOSIS — G4733 Obstructive sleep apnea (adult) (pediatric): Secondary | ICD-10-CM | POA: Diagnosis present

## 2022-08-28 DIAGNOSIS — Z992 Dependence on renal dialysis: Secondary | ICD-10-CM | POA: Diagnosis not present

## 2022-08-28 DIAGNOSIS — Z85828 Personal history of other malignant neoplasm of skin: Secondary | ICD-10-CM

## 2022-08-28 DIAGNOSIS — Z4781 Encounter for orthopedic aftercare following surgical amputation: Secondary | ICD-10-CM | POA: Diagnosis not present

## 2022-08-28 DIAGNOSIS — E1165 Type 2 diabetes mellitus with hyperglycemia: Secondary | ICD-10-CM | POA: Diagnosis present

## 2022-08-28 DIAGNOSIS — Z888 Allergy status to other drugs, medicaments and biological substances status: Secondary | ICD-10-CM

## 2022-08-28 DIAGNOSIS — R2681 Unsteadiness on feet: Secondary | ICD-10-CM | POA: Diagnosis not present

## 2022-08-28 DIAGNOSIS — M7989 Other specified soft tissue disorders: Secondary | ICD-10-CM | POA: Diagnosis not present

## 2022-08-28 DIAGNOSIS — N179 Acute kidney failure, unspecified: Secondary | ICD-10-CM | POA: Diagnosis present

## 2022-08-28 DIAGNOSIS — I132 Hypertensive heart and chronic kidney disease with heart failure and with stage 5 chronic kidney disease, or end stage renal disease: Secondary | ICD-10-CM | POA: Diagnosis not present

## 2022-08-28 DIAGNOSIS — K521 Toxic gastroenteritis and colitis: Secondary | ICD-10-CM | POA: Diagnosis not present

## 2022-08-28 DIAGNOSIS — K3189 Other diseases of stomach and duodenum: Secondary | ICD-10-CM | POA: Diagnosis not present

## 2022-08-28 DIAGNOSIS — R1310 Dysphagia, unspecified: Secondary | ICD-10-CM | POA: Diagnosis not present

## 2022-08-28 DIAGNOSIS — E11610 Type 2 diabetes mellitus with diabetic neuropathic arthropathy: Secondary | ICD-10-CM | POA: Diagnosis present

## 2022-08-28 DIAGNOSIS — Z885 Allergy status to narcotic agent status: Secondary | ICD-10-CM

## 2022-08-28 DIAGNOSIS — A4902 Methicillin resistant Staphylococcus aureus infection, unspecified site: Secondary | ICD-10-CM

## 2022-08-28 DIAGNOSIS — J90 Pleural effusion, not elsewhere classified: Secondary | ICD-10-CM | POA: Diagnosis not present

## 2022-08-28 DIAGNOSIS — Z794 Long term (current) use of insulin: Secondary | ICD-10-CM | POA: Diagnosis not present

## 2022-08-28 DIAGNOSIS — M79609 Pain in unspecified limb: Secondary | ICD-10-CM | POA: Diagnosis not present

## 2022-08-28 DIAGNOSIS — M86641 Other chronic osteomyelitis, right hand: Secondary | ICD-10-CM | POA: Diagnosis not present

## 2022-08-28 DIAGNOSIS — N2581 Secondary hyperparathyroidism of renal origin: Secondary | ICD-10-CM | POA: Diagnosis present

## 2022-08-28 DIAGNOSIS — Z87442 Personal history of urinary calculi: Secondary | ICD-10-CM

## 2022-08-28 DIAGNOSIS — E1122 Type 2 diabetes mellitus with diabetic chronic kidney disease: Secondary | ICD-10-CM | POA: Diagnosis not present

## 2022-08-28 DIAGNOSIS — E871 Hypo-osmolality and hyponatremia: Secondary | ICD-10-CM | POA: Diagnosis present

## 2022-08-28 DIAGNOSIS — D62 Acute posthemorrhagic anemia: Secondary | ICD-10-CM | POA: Diagnosis not present

## 2022-08-28 DIAGNOSIS — H3530 Unspecified macular degeneration: Secondary | ICD-10-CM | POA: Diagnosis present

## 2022-08-28 DIAGNOSIS — E1142 Type 2 diabetes mellitus with diabetic polyneuropathy: Secondary | ICD-10-CM | POA: Diagnosis not present

## 2022-08-28 DIAGNOSIS — M86141 Other acute osteomyelitis, right hand: Secondary | ICD-10-CM | POA: Diagnosis present

## 2022-08-28 DIAGNOSIS — E11311 Type 2 diabetes mellitus with unspecified diabetic retinopathy with macular edema: Secondary | ICD-10-CM | POA: Diagnosis present

## 2022-08-28 DIAGNOSIS — J690 Pneumonitis due to inhalation of food and vomit: Secondary | ICD-10-CM | POA: Diagnosis not present

## 2022-08-28 DIAGNOSIS — K222 Esophageal obstruction: Secondary | ICD-10-CM | POA: Diagnosis not present

## 2022-08-28 DIAGNOSIS — K5730 Diverticulosis of large intestine without perforation or abscess without bleeding: Secondary | ICD-10-CM | POA: Diagnosis present

## 2022-08-28 DIAGNOSIS — I10 Essential (primary) hypertension: Secondary | ICD-10-CM | POA: Diagnosis not present

## 2022-08-28 DIAGNOSIS — E1169 Type 2 diabetes mellitus with other specified complication: Secondary | ICD-10-CM | POA: Diagnosis not present

## 2022-08-28 DIAGNOSIS — I959 Hypotension, unspecified: Secondary | ICD-10-CM | POA: Diagnosis not present

## 2022-08-28 DIAGNOSIS — R1319 Other dysphagia: Secondary | ICD-10-CM | POA: Diagnosis not present

## 2022-08-28 DIAGNOSIS — I2510 Atherosclerotic heart disease of native coronary artery without angina pectoris: Secondary | ICD-10-CM | POA: Diagnosis not present

## 2022-08-28 DIAGNOSIS — E1140 Type 2 diabetes mellitus with diabetic neuropathy, unspecified: Secondary | ICD-10-CM | POA: Diagnosis present

## 2022-08-28 DIAGNOSIS — Z8249 Family history of ischemic heart disease and other diseases of the circulatory system: Secondary | ICD-10-CM

## 2022-08-28 DIAGNOSIS — I82C11 Acute embolism and thrombosis of right internal jugular vein: Secondary | ICD-10-CM | POA: Diagnosis present

## 2022-08-28 DIAGNOSIS — Z9989 Dependence on other enabling machines and devices: Secondary | ICD-10-CM | POA: Diagnosis not present

## 2022-08-28 DIAGNOSIS — Z91048 Other nonmedicinal substance allergy status: Secondary | ICD-10-CM

## 2022-08-28 DIAGNOSIS — R41841 Cognitive communication deficit: Secondary | ICD-10-CM | POA: Diagnosis not present

## 2022-08-28 DIAGNOSIS — E785 Hyperlipidemia, unspecified: Secondary | ICD-10-CM | POA: Diagnosis present

## 2022-08-28 DIAGNOSIS — J189 Pneumonia, unspecified organism: Secondary | ICD-10-CM | POA: Diagnosis not present

## 2022-08-28 DIAGNOSIS — I5032 Chronic diastolic (congestive) heart failure: Secondary | ICD-10-CM | POA: Diagnosis present

## 2022-08-28 DIAGNOSIS — K259 Gastric ulcer, unspecified as acute or chronic, without hemorrhage or perforation: Secondary | ICD-10-CM | POA: Diagnosis not present

## 2022-08-28 DIAGNOSIS — R1314 Dysphagia, pharyngoesophageal phase: Secondary | ICD-10-CM | POA: Diagnosis not present

## 2022-08-28 DIAGNOSIS — I482 Chronic atrial fibrillation, unspecified: Secondary | ICD-10-CM | POA: Diagnosis not present

## 2022-08-28 DIAGNOSIS — E11628 Type 2 diabetes mellitus with other skin complications: Secondary | ICD-10-CM | POA: Diagnosis not present

## 2022-08-28 DIAGNOSIS — Z833 Family history of diabetes mellitus: Secondary | ICD-10-CM

## 2022-08-28 DIAGNOSIS — L089 Local infection of the skin and subcutaneous tissue, unspecified: Secondary | ICD-10-CM | POA: Diagnosis not present

## 2022-08-28 DIAGNOSIS — I110 Hypertensive heart disease with heart failure: Secondary | ICD-10-CM | POA: Diagnosis not present

## 2022-08-28 DIAGNOSIS — R933 Abnormal findings on diagnostic imaging of other parts of digestive tract: Secondary | ICD-10-CM | POA: Diagnosis not present

## 2022-08-28 DIAGNOSIS — Z719 Counseling, unspecified: Secondary | ICD-10-CM | POA: Diagnosis not present

## 2022-08-28 DIAGNOSIS — I96 Gangrene, not elsewhere classified: Secondary | ICD-10-CM | POA: Diagnosis not present

## 2022-08-28 DIAGNOSIS — Z79899 Other long term (current) drug therapy: Secondary | ICD-10-CM

## 2022-08-28 DIAGNOSIS — G4730 Sleep apnea, unspecified: Secondary | ICD-10-CM | POA: Diagnosis not present

## 2022-08-28 DIAGNOSIS — T82898A Other specified complication of vascular prosthetic devices, implants and grafts, initial encounter: Secondary | ICD-10-CM | POA: Diagnosis not present

## 2022-08-28 DIAGNOSIS — M1990 Unspecified osteoarthritis, unspecified site: Secondary | ICD-10-CM | POA: Diagnosis present

## 2022-08-28 DIAGNOSIS — M86042 Acute hematogenous osteomyelitis, left hand: Secondary | ICD-10-CM | POA: Diagnosis not present

## 2022-08-28 HISTORY — DX: End stage renal disease: N18.6

## 2022-08-28 HISTORY — DX: Dependence on renal dialysis: Z99.2

## 2022-08-28 LAB — CBC WITH DIFFERENTIAL/PLATELET
Abs Immature Granulocytes: 0.09 10*3/uL — ABNORMAL HIGH (ref 0.00–0.07)
Absolute Monocytes: 1320 cells/uL — ABNORMAL HIGH (ref 200–950)
Basophils Absolute: 33 cells/uL (ref 0–200)
Basophils Absolute: 0 10*3/uL (ref 0.0–0.1)
Basophils Relative: 0.2 %
Basophils Relative: 0 %
Eosinophils Absolute: 83 cells/uL (ref 15–500)
Eosinophils Absolute: 0.1 10*3/uL (ref 0.0–0.5)
Eosinophils Relative: 0.5 %
Eosinophils Relative: 0 %
HCT: 33.9 % — ABNORMAL LOW (ref 38.5–50.0)
HCT: 33.3 % — ABNORMAL LOW (ref 39.0–52.0)
Hemoglobin: 10.7 g/dL — ABNORMAL LOW (ref 13.2–17.1)
Hemoglobin: 10.4 g/dL — ABNORMAL LOW (ref 13.0–17.0)
Immature Granulocytes: 1 %
Lymphocytes Relative: 3 %
Lymphs Abs: 644 cells/uL — ABNORMAL LOW (ref 850–3900)
Lymphs Abs: 0.5 10*3/uL — ABNORMAL LOW (ref 0.7–4.0)
MCH: 29.4 pg (ref 27.0–33.0)
MCH: 29.9 pg (ref 26.0–34.0)
MCHC: 31.6 g/dL — ABNORMAL LOW (ref 32.0–36.0)
MCHC: 31.2 g/dL (ref 30.0–36.0)
MCV: 93.1 fL (ref 80.0–100.0)
MCV: 95.7 fL (ref 80.0–100.0)
MPV: 10.8 fL (ref 7.5–12.5)
Monocytes Absolute: 1.3 10*3/uL — ABNORMAL HIGH (ref 0.1–1.0)
Monocytes Relative: 8 %
Monocytes Relative: 8 %
Neutro Abs: 14421 cells/uL — ABNORMAL HIGH (ref 1500–7800)
Neutro Abs: 14.4 10*3/uL — ABNORMAL HIGH (ref 1.7–7.7)
Neutrophils Relative %: 87.4 %
Neutrophils Relative %: 88 %
Platelets: 217 10*3/uL (ref 140–400)
Platelets: 215 10*3/uL (ref 150–400)
RBC: 3.64 10*6/uL — ABNORMAL LOW (ref 4.20–5.80)
RBC: 3.48 MIL/uL — ABNORMAL LOW (ref 4.22–5.81)
RDW: 17.2 % — ABNORMAL HIGH (ref 11.0–15.0)
RDW: 19.7 % — ABNORMAL HIGH (ref 11.5–15.5)
Total Lymphocyte: 3.9 %
WBC: 16.5 10*3/uL — ABNORMAL HIGH (ref 3.8–10.8)
WBC: 16.3 10*3/uL — ABNORMAL HIGH (ref 4.0–10.5)
nRBC: 0 % (ref 0.0–0.2)

## 2022-08-28 NOTE — ED Triage Notes (Signed)
Pt wants evaluation for the left thumb, pt has had ongoing issues with wounds on his hands per his wife, has been on and off dealing with his left thumb for about 6 months, now is the worse it has been. Thumb is discolored, red, decreased sensation, wound with serosanguinous drainage noted to the tip of the thumb. Denies fevers. Dialysis pt.

## 2022-08-28 NOTE — Progress Notes (Signed)
   Acute Office Visit  Subjective:     Patient ID: Louis [name], male    DOB: [birth year], 76 y.o.   MRN: [id number]  Chief Complaint  Patient presents with   hand swelling    Left hand and thumb  swelling and gel like bloody drainage  x yesterday ( pt wife states almost all patient fingers are  opening  up - )- states was told this was possibly neuropathy - pt using eliquis - isosorbide was stopped  by Dr. Demaris Callander about 3 days ago    thumb swelling    HPI Patient is in today for Left hand and thumb pain and swelling and gel like bloody drainage  x yesterday ( pt wife states almost all patient fingers are  opening  up - )- states was told this was possibly neuropathy - pt using eliquis - isosorbide was stopped  by Dr. Demaris Callander about 3 days ago.  He says it was so painful last night that it was waking him up every hour to hour and a half.  It was throbbing.  This morning he pressed on it until little bit of drainage came out of the side of the finger.  ROS      Objective:    BP (!) 96/58   Pulse (!) 53   Ht '5\' 7"'$  (1.702 m)   SpO2 97%   BMI 34.61 kg/m    Physical Exam Vitals reviewed.  Constitutional:      Appearance: He is well-developed.  HENT:     Head: Normocephalic and atraumatic.  Eyes:     Conjunctiva/sclera: Conjunctivae normal.  Cardiovascular:     Rate and Rhythm: Normal rate.  Pulmonary:     Effort: Pulmonary effort is normal.  Skin:    General: Skin is dry.     Coloration: Skin is not pale.     Comments: Left thumb appears to be a dark purpleish color with some mild erythema at the base of the thumb going towards the hand.  He has edema of the posterior hand and a little bit going into the wrist.  I was able to take a sterile blade after cleaning this clinic with chlorhexidine to remove a little bit of the eschar.  I was able to collect some of the thick bloody drainage for wound culture.  No odor and no frank pus.  But he did have air bubbles released from  the wound.  Neurological:     Mental Status: He is alert and oriented to person, place, and time.  Psychiatric:        Behavior: Behavior normal.     No results found for any visits on 08/28/22.      Assessment & Plan:   Problem List Items Addressed This Visit   None Visit Diagnoses     Open wound of left thumb, initial encounter    -  Primary   Relevant Orders   CBC with Differential/Platelet   WOUND CULTURE   DG Finger Thumb Left      Wound of the left thumb-again area cleaned and part of the eschar was removed.  Patient tolerated procedure well.  Given 2 extra strength Tylenol for just general pain.  Ordered CBC with differential.  Wound culture collected.  Plain x-ray ordered as well.  No orders of the defined types were placed in this encounter.   No follow-ups on file.  [name], MD

## 2022-08-28 NOTE — ED Provider Triage Note (Signed)
Emergency Medicine Provider Triage Evaluation Note  Louis [name] , a 76 y.o. male  was evaluated in triage.  Pt complains of left thumb injury, states that he was told by his doctor he might have gangrene in the left thumb, history of neuropathy as well as dialysis Tuesday Thursday Saturday states that he rarely gets wounds on his hands but they typically are treated and then go on his own, states he has been treating this one on his left thumb for the last 6 months, but over the last 2 days symptoms have gotten worse and become more swollen and become black, states that he seen his primary care doctor and sent here for further evaluation he has not been on any antibiotics he has not missed any treatments of his dialysis.  Review of Systems  Positive: Thumb pain, wounds Negative: Fevers, chills  Physical Exam  BP 110/60   Pulse 67   Temp 98 F (36.7 C) (Oral)   Resp (!) 22   SpO2 100%  Gen:   Awake, no distress   Resp:  Normal effort  MSK:   Moves extremities without difficulty  Other:  Patient has necrotic looking left thumb, he has minimal sensation, it is cool to the touch, he has full range of motion and normal joints.   Medical Decision Making  Medically screening exam initiated at 11:16 PM.  Appropriate orders placed.  [name] was informed that the remainder of the evaluation will be completed by another provider, this initial triage assessment does not replace that evaluation, and the importance of remaining in the ED until their evaluation is complete.  Concern for osteomyelitis, patient was made a acuity to, obtain sepsis work-up, imaging, recommend immediate rooming for further evaluation.   Marcello Fennel, PA-C 08/28/22 2318

## 2022-08-29 ENCOUNTER — Observation Stay (HOSPITAL_COMMUNITY): Payer: Medicare Other | Admitting: Anesthesiology

## 2022-08-29 ENCOUNTER — Observation Stay (HOSPITAL_BASED_OUTPATIENT_CLINIC_OR_DEPARTMENT_OTHER): Payer: Medicare Other | Admitting: Anesthesiology

## 2022-08-29 ENCOUNTER — Observation Stay (HOSPITAL_COMMUNITY): Payer: Medicare Other

## 2022-08-29 ENCOUNTER — Encounter (HOSPITAL_COMMUNITY): Payer: Self-pay | Admitting: Internal Medicine

## 2022-08-29 ENCOUNTER — Other Ambulatory Visit: Payer: Self-pay

## 2022-08-29 ENCOUNTER — Encounter (HOSPITAL_COMMUNITY)
Admission: EM | Disposition: A | Discharge status: Skilled Nursing Facility | Payer: Self-pay | Source: Home / Self Care | Attending: Family Medicine

## 2022-08-29 DIAGNOSIS — M86042 Acute hematogenous osteomyelitis, left hand: Secondary | ICD-10-CM

## 2022-08-29 DIAGNOSIS — I2510 Atherosclerotic heart disease of native coronary artery without angina pectoris: Secondary | ICD-10-CM

## 2022-08-29 DIAGNOSIS — I132 Hypertensive heart and chronic kidney disease with heart failure and with stage 5 chronic kidney disease, or end stage renal disease: Secondary | ICD-10-CM | POA: Diagnosis not present

## 2022-08-29 DIAGNOSIS — I509 Heart failure, unspecified: Secondary | ICD-10-CM

## 2022-08-29 DIAGNOSIS — R0600 Dyspnea, unspecified: Secondary | ICD-10-CM | POA: Diagnosis not present

## 2022-08-29 DIAGNOSIS — E1122 Type 2 diabetes mellitus with diabetic chronic kidney disease: Secondary | ICD-10-CM | POA: Diagnosis not present

## 2022-08-29 DIAGNOSIS — Z87891 Personal history of nicotine dependence: Secondary | ICD-10-CM | POA: Diagnosis not present

## 2022-08-29 DIAGNOSIS — T82898A Other specified complication of vascular prosthetic devices, implants and grafts, initial encounter: Secondary | ICD-10-CM

## 2022-08-29 DIAGNOSIS — M869 Osteomyelitis, unspecified: Secondary | ICD-10-CM | POA: Diagnosis not present

## 2022-08-29 DIAGNOSIS — A480 Gas gangrene: Secondary | ICD-10-CM | POA: Diagnosis not present

## 2022-08-29 DIAGNOSIS — N186 End stage renal disease: Secondary | ICD-10-CM

## 2022-08-29 DIAGNOSIS — J90 Pleural effusion, not elsewhere classified: Secondary | ICD-10-CM | POA: Diagnosis not present

## 2022-08-29 DIAGNOSIS — Z992 Dependence on renal dialysis: Secondary | ICD-10-CM | POA: Diagnosis not present

## 2022-08-29 DIAGNOSIS — D631 Anemia in chronic kidney disease: Secondary | ICD-10-CM | POA: Diagnosis not present

## 2022-08-29 HISTORY — PX: LIGATION OF ARTERIOVENOUS  FISTULA: SHX5948

## 2022-08-29 HISTORY — PX: INSERTION OF DIALYSIS CATHETER: SHX1324

## 2022-08-29 LAB — COMPREHENSIVE METABOLIC PANEL
ALT: 23 U/L (ref 0–44)
AST: 26 U/L (ref 15–41)
Albumin: 2.4 g/dL — ABNORMAL LOW (ref 3.5–5.0)
Alkaline Phosphatase: 248 U/L — ABNORMAL HIGH (ref 38–126)
Anion gap: 14 (ref 5–15)
BUN: 68 mg/dL — ABNORMAL HIGH (ref 8–23)
CO2: 27 mmol/L (ref 22–32)
Calcium: 8.6 mg/dL — ABNORMAL LOW (ref 8.9–10.3)
Chloride: 90 mmol/L — ABNORMAL LOW (ref 98–111)
Creatinine, Ser: 6.76 mg/dL — ABNORMAL HIGH (ref 0.61–1.24)
GFR, Estimated: 8 mL/min — ABNORMAL LOW (ref >60)
Glucose, Bld: 137 mg/dL — ABNORMAL HIGH (ref 70–99)
Potassium: 4.7 mmol/L (ref 3.5–5.1)
Sodium: 131 mmol/L — ABNORMAL LOW (ref 135–145)
Total Bilirubin: 1 mg/dL (ref 0.3–1.2)
Total Protein: 6.5 g/dL (ref 6.5–8.1)

## 2022-08-29 LAB — POCT I-STAT, CHEM 8
BUN: 74 mg/dL — ABNORMAL HIGH (ref 8–23)
Calcium, Ion: 1.07 mmol/L — ABNORMAL LOW (ref 1.15–1.40)
Chloride: 90 mmol/L — ABNORMAL LOW (ref 98–111)
Creatinine, Ser: 7.4 mg/dL — ABNORMAL HIGH (ref 0.61–1.24)
Glucose, Bld: 102 mg/dL — ABNORMAL HIGH (ref 70–99)
HCT: 35 % — ABNORMAL LOW (ref 39.0–52.0)
Hemoglobin: 11.9 g/dL — ABNORMAL LOW (ref 13.0–17.0)
Potassium: 5 mmol/L (ref 3.5–5.1)
Sodium: 127 mmol/L — ABNORMAL LOW (ref 135–145)
TCO2: 29 mmol/L (ref 22–32)

## 2022-08-29 LAB — LACTIC ACID, PLASMA
Lactic Acid, Venous: 1.5 mmol/L (ref 0.5–1.9)
Lactic Acid, Venous: 2 mmol/L (ref 0.5–1.9)

## 2022-08-29 LAB — HEPATITIS B CORE ANTIBODY, TOTAL: Hep B Core Total Ab: NONREACTIVE

## 2022-08-29 LAB — HEPATITIS B SURFACE ANTIBODY,QUALITATIVE: Hep B S Ab: REACTIVE — AB

## 2022-08-29 LAB — C-REACTIVE PROTEIN: CRP: 22.8 mg/dL — ABNORMAL HIGH (ref <1.0)

## 2022-08-29 LAB — GLUCOSE, CAPILLARY
Glucose-Capillary: 122 mg/dL — ABNORMAL HIGH (ref 70–99)
Glucose-Capillary: 94 mg/dL (ref 70–99)
Glucose-Capillary: 114 mg/dL — ABNORMAL HIGH (ref 70–99)
Glucose-Capillary: 168 mg/dL — ABNORMAL HIGH (ref 70–99)

## 2022-08-29 LAB — SEDIMENTATION RATE: Sed Rate: 65 mm/hr — ABNORMAL HIGH (ref 0–16)

## 2022-08-29 LAB — MAGNESIUM: Magnesium: 3 mg/dL — ABNORMAL HIGH (ref 1.7–2.4)

## 2022-08-29 LAB — HEPATITIS B SURFACE ANTIGEN: Hepatitis B Surface Ag: NONREACTIVE

## 2022-08-29 LAB — HEPATITIS C ANTIBODY: HCV Ab: NONREACTIVE

## 2022-08-29 SURGERY — LIGATION OF ARTERIOVENOUS  FISTULA
Anesthesia: Monitor Anesthesia Care | Site: Chest | Laterality: Left

## 2022-08-29 MED ORDER — TRAMADOL HCL 50 MG PO TABS
50.0000 mg | ORAL_TABLET | Freq: Two times a day (BID) | ORAL | Status: DC | PRN
  Administered 2022-08-29 – 2022-09-13 (×6): 50 mg via ORAL
  Filled 2022-08-29 (×7): qty 1

## 2022-08-29 MED ORDER — HEPARIN SODIUM (PORCINE) 1000 UNIT/ML IJ SOLN
INTRAMUSCULAR | Status: AC
  Filled 2022-08-29: qty 10

## 2022-08-29 MED ORDER — PROPOFOL 500 MG/50ML IV EMUL
INTRAVENOUS | Status: DC | PRN
  Administered 2022-08-29: 70 ug/kg/min via INTRAVENOUS

## 2022-08-29 MED ORDER — INSULIN ASPART 100 UNIT/ML IJ SOLN: 0.0000 [IU] | INTRAMUSCULAR | Status: DC | PRN

## 2022-08-29 MED ORDER — PIPERACILLIN-TAZOBACTAM IN DEX 2-0.25 GM/50ML IV SOLN
2.2500 g | Freq: Three times a day (TID) | INTRAVENOUS | Status: DC
  Administered 2022-08-30: 2.25 g via INTRAVENOUS
  Filled 2022-08-29 (×3): qty 50

## 2022-08-29 MED ORDER — CEFAZOLIN SODIUM-DEXTROSE 2-4 GM/100ML-% IV SOLN
2.0000 g | Freq: Once | INTRAVENOUS | Status: AC
  Administered 2022-08-29: 2 g via INTRAVENOUS

## 2022-08-29 MED ORDER — CHLORHEXIDINE GLUCONATE 4 % EX LIQD: 60.0000 mL | Freq: Once | CUTANEOUS | Status: DC

## 2022-08-29 MED ORDER — MEPERIDINE HCL 25 MG/ML IJ SOLN: 6.2500 mg | INTRAMUSCULAR | Status: DC | PRN

## 2022-08-29 MED ORDER — ROSUVASTATIN CALCIUM 20 MG PO TABS
40.0000 mg | ORAL_TABLET | Freq: Every day | ORAL | Status: DC
  Administered 2022-08-29 – 2022-08-31 (×3): 40 mg via ORAL
  Filled 2022-08-29 (×3): qty 2

## 2022-08-29 MED ORDER — HEPARIN 6000 UNIT IRRIGATION SOLUTION
Status: AC
  Filled 2022-08-29: qty 500

## 2022-08-29 MED ORDER — PHENYLEPHRINE HCL-NACL 20-0.9 MG/250ML-% IV SOLN
INTRAVENOUS | Status: DC | PRN
  Administered 2022-08-29: 20 ug/min via INTRAVENOUS

## 2022-08-29 MED ORDER — LIDOCAINE HCL (PF) 1 % IJ SOLN
INTRAMUSCULAR | Status: AC
  Filled 2022-08-29: qty 30

## 2022-08-29 MED ORDER — POVIDONE-IODINE 10 % EX SWAB: 2.0000 | Freq: Once | CUTANEOUS | Status: DC

## 2022-08-29 MED ORDER — LINEZOLID 600 MG/300ML IV SOLN
600.0000 mg | Freq: Two times a day (BID) | INTRAVENOUS | Status: DC
  Administered 2022-08-29 – 2022-08-30 (×3): 600 mg via INTRAVENOUS
  Filled 2022-08-29 (×4): qty 300

## 2022-08-29 MED ORDER — ORAL CARE MOUTH RINSE: 15.0000 mL | Freq: Once | OROMUCOSAL | Status: AC

## 2022-08-29 MED ORDER — PHENYLEPHRINE 80 MCG/ML (10ML) SYRINGE FOR IV PUSH (FOR BLOOD PRESSURE SUPPORT)
PREFILLED_SYRINGE | INTRAVENOUS | Status: DC | PRN
  Administered 2022-08-29 (×2): 160 ug via INTRAVENOUS

## 2022-08-29 MED ORDER — INSULIN ASPART 100 UNIT/ML IJ SOLN
0.0000 [IU] | Freq: Three times a day (TID) | INTRAMUSCULAR | Status: DC
  Administered 2022-08-31: 2 [IU] via SUBCUTANEOUS
  Administered 2022-08-31: 4 [IU] via SUBCUTANEOUS
  Administered 2022-09-01: 2 [IU] via SUBCUTANEOUS
  Administered 2022-09-02: 4 [IU] via SUBCUTANEOUS
  Administered 2022-09-03 – 2022-09-04 (×5): 2 [IU] via SUBCUTANEOUS
  Administered 2022-09-04 – 2022-09-05 (×2): 4 [IU] via SUBCUTANEOUS
  Administered 2022-09-05: 12 [IU] via SUBCUTANEOUS
  Administered 2022-09-06: 8 [IU] via SUBCUTANEOUS
  Administered 2022-09-06: 16 [IU] via SUBCUTANEOUS
  Administered 2022-09-06: 4 [IU] via SUBCUTANEOUS
  Administered 2022-09-07: 12 [IU] via SUBCUTANEOUS
  Administered 2022-09-07: 4 [IU] via SUBCUTANEOUS
  Administered 2022-09-08: 8 [IU] via SUBCUTANEOUS
  Administered 2022-09-08: 2 [IU] via SUBCUTANEOUS
  Administered 2022-09-08: 4 [IU] via SUBCUTANEOUS
  Administered 2022-09-09 – 2022-09-10 (×3): 2 [IU] via SUBCUTANEOUS
  Administered 2022-09-10 – 2022-09-11 (×3): 4 [IU] via SUBCUTANEOUS
  Administered 2022-09-11 – 2022-09-12 (×3): 2 [IU] via SUBCUTANEOUS
  Administered 2022-09-13: 4 [IU] via SUBCUTANEOUS
  Administered 2022-09-13: 2 [IU] via SUBCUTANEOUS

## 2022-08-29 MED ORDER — CEFAZOLIN SODIUM-DEXTROSE 2-4 GM/100ML-% IV SOLN: 2.0000 g | INTRAVENOUS | Status: DC

## 2022-08-29 MED ORDER — FENTANYL CITRATE (PF) 250 MCG/5ML IJ SOLN
INTRAMUSCULAR | Status: AC
  Filled 2022-08-29: qty 5

## 2022-08-29 MED ORDER — FENTANYL CITRATE (PF) 100 MCG/2ML IJ SOLN: 25.0000 ug | INTRAMUSCULAR | Status: DC | PRN

## 2022-08-29 MED ORDER — ACETAMINOPHEN 325 MG PO TABS
650.0000 mg | ORAL_TABLET | Freq: Four times a day (QID) | ORAL | Status: DC | PRN
  Administered 2022-08-29 – 2022-09-12 (×7): 650 mg via ORAL
  Filled 2022-08-29 (×7): qty 2

## 2022-08-29 MED ORDER — OXYCODONE HCL 5 MG PO TABS: 5.0000 mg | ORAL_TABLET | ORAL | Status: DC | PRN

## 2022-08-29 MED ORDER — LIDOCAINE-EPINEPHRINE (PF) 1 %-1:200000 IJ SOLN
INTRAMUSCULAR | Status: DC | PRN
  Administered 2022-08-29: 10 mL

## 2022-08-29 MED ORDER — ACETAMINOPHEN 650 MG RE SUPP
650.0000 mg | Freq: Four times a day (QID) | RECTAL | Status: DC | PRN
  Filled 2022-08-29: qty 1

## 2022-08-29 MED ORDER — PROMETHAZINE HCL 25 MG/ML IJ SOLN: 6.2500 mg | INTRAMUSCULAR | Status: DC | PRN

## 2022-08-29 MED ORDER — TETANUS-DIPHTH-ACELL PERTUSSIS 5-2.5-18.5 LF-MCG/0.5 IM SUSY
0.5000 mL | PREFILLED_SYRINGE | Freq: Once | INTRAMUSCULAR | Status: AC
  Administered 2022-08-29: 0.5 mL via INTRAMUSCULAR
  Filled 2022-08-29: qty 0.5

## 2022-08-29 MED ORDER — OXYCODONE HCL 5 MG/5ML PO SOLN: 5.0000 mg | Freq: Once | ORAL | Status: DC | PRN

## 2022-08-29 MED ORDER — LIDOCAINE-EPINEPHRINE (PF) 1 %-1:200000 IJ SOLN
INTRAMUSCULAR | Status: AC
  Filled 2022-08-29: qty 30

## 2022-08-29 MED ORDER — SODIUM CHLORIDE 0.9 % IV SOLN: INTRAVENOUS | Status: DC

## 2022-08-29 MED ORDER — FENTANYL CITRATE (PF) 250 MCG/5ML IJ SOLN
INTRAMUSCULAR | Status: DC | PRN
  Administered 2022-08-29: 25 ug via INTRAVENOUS

## 2022-08-29 MED ORDER — PIPERACILLIN-TAZOBACTAM 3.375 G IVPB 30 MIN
3.3750 g | Freq: Once | INTRAVENOUS | Status: AC
  Administered 2022-08-29: 3.375 g via INTRAVENOUS
  Filled 2022-08-29: qty 50

## 2022-08-29 MED ORDER — CHLORHEXIDINE GLUCONATE 0.12 % MT SOLN
OROMUCOSAL | Status: AC
  Administered 2022-08-29: 15 mL via OROMUCOSAL
  Filled 2022-08-29: qty 15

## 2022-08-29 MED ORDER — INSULIN ASPART 100 UNIT/ML IJ SOLN
4.0000 [IU] | Freq: Three times a day (TID) | INTRAMUSCULAR | Status: DC
  Administered 2022-08-29 – 2022-09-13 (×33): 4 [IU] via SUBCUTANEOUS

## 2022-08-29 MED ORDER — CEFAZOLIN SODIUM-DEXTROSE 2-4 GM/100ML-% IV SOLN
INTRAVENOUS | Status: AC
  Filled 2022-08-29: qty 100

## 2022-08-29 MED ORDER — HEPARIN SODIUM (PORCINE) 1000 UNIT/ML IJ SOLN
INTRAMUSCULAR | Status: DC | PRN
  Administered 2022-08-29: 4200 [IU]

## 2022-08-29 MED ORDER — HEPARIN 6000 UNIT IRRIGATION SOLUTION
Status: DC | PRN
  Administered 2022-08-29: 1

## 2022-08-29 MED ORDER — CHLORHEXIDINE GLUCONATE CLOTH 2 % EX PADS
6.0000 | MEDICATED_PAD | Freq: Every day | CUTANEOUS | Status: DC
  Administered 2022-08-30 – 2022-09-11 (×10): 6 via TOPICAL

## 2022-08-29 MED ORDER — MIDODRINE HCL 5 MG PO TABS
10.0000 mg | ORAL_TABLET | ORAL | Status: DC
  Administered 2022-08-31 – 2022-09-12 (×7): 10 mg via ORAL
  Filled 2022-08-29 (×8): qty 2

## 2022-08-29 MED ORDER — MIDAZOLAM HCL 2 MG/2ML IJ SOLN: 0.5000 mg | Freq: Once | INTRAMUSCULAR | Status: DC | PRN

## 2022-08-29 MED ORDER — OXYCODONE HCL 5 MG PO TABS: 5.0000 mg | ORAL_TABLET | Freq: Once | ORAL | Status: DC | PRN

## 2022-08-29 MED ORDER — LIDOCAINE HCL (PF) 1 % IJ SOLN
INTRAMUSCULAR | Status: DC | PRN
  Administered 2022-08-29: 30 mL

## 2022-08-29 MED ORDER — CHLORHEXIDINE GLUCONATE 0.12 % MT SOLN: 15.0000 mL | Freq: Once | OROMUCOSAL | Status: AC

## 2022-08-29 MED ORDER — 0.9 % SODIUM CHLORIDE (POUR BTL) OPTIME
TOPICAL | Status: DC | PRN
  Administered 2022-08-29: 1000 mL

## 2022-08-29 MED ORDER — INSULIN DETEMIR 100 UNIT/ML ~~LOC~~ SOLN
20.0000 [IU] | Freq: Every day | SUBCUTANEOUS | Status: DC
  Administered 2022-08-29 – 2022-09-05 (×8): 20 [IU] via SUBCUTANEOUS
  Filled 2022-08-29 (×9): qty 0.2

## 2022-08-29 SURGICAL SUPPLY — 45 items
ADH SKN CLS APL DERMABOND .7 (GAUZE/BANDAGES/DRESSINGS) ×4
APL PRP STRL LF DISP 70% ISPRP (MISCELLANEOUS) ×2
BAG DECANTER FOR FLEXI CONT (MISCELLANEOUS) ×3 IMPLANT
BIOPATCH RED 1 DISK 7.0 (GAUZE/BANDAGES/DRESSINGS) ×3 IMPLANT
CANISTER SUCT 3000ML PPV (MISCELLANEOUS) ×3 IMPLANT
CANNULA VESSEL 3MM 2 BLNT TIP (CANNULA) IMPLANT
CATH PALINDROME-P 28CM W/VT (CATHETERS) IMPLANT
CHLORAPREP W/TINT 26 (MISCELLANEOUS) ×3 IMPLANT
CLIP VESOCCLUDE MED 6/CT (CLIP) ×3 IMPLANT
CLIP VESOCCLUDE SM WIDE 6/CT (CLIP) ×3 IMPLANT
COVER SURGICAL LIGHT HANDLE (MISCELLANEOUS) ×3 IMPLANT
DERMABOND ADVANCED .7 DNX12 (GAUZE/BANDAGES/DRESSINGS) ×3 IMPLANT
DRAPE C-ARM 42X72 X-RAY (DRAPES) ×3 IMPLANT
DRAPE CHEST BREAST 15X10 FENES (DRAPES) ×3 IMPLANT
DRSG COVADERM 4X6 (GAUZE/BANDAGES/DRESSINGS) IMPLANT
ELECT REM PT RETURN 9FT ADLT (ELECTROSURGICAL)
ELECTRODE REM PT RTRN 9FT ADLT (ELECTROSURGICAL) ×3 IMPLANT
GLOVE BIOGEL PI IND STRL 8 (GLOVE) ×3 IMPLANT
GLOVE SURG POLY ORTHO LF SZ7.5 (GLOVE) IMPLANT
GOWN STRL REUS W/ TWL LRG LVL3 (GOWN DISPOSABLE) ×9 IMPLANT
GOWN STRL REUS W/TWL LRG LVL3 (GOWN DISPOSABLE)
KIT BASIN OR (CUSTOM PROCEDURE TRAY) ×3 IMPLANT
KIT TURNOVER KIT B (KITS) ×3 IMPLANT
NDL 18GX1X1/2 (RX/OR ONLY) (NEEDLE) ×3 IMPLANT
NDL HYPO 25GX1X1/2 BEV (NEEDLE) ×3 IMPLANT
NEEDLE 18GX1X1/2 (RX/OR ONLY) (NEEDLE) IMPLANT
NEEDLE HYPO 25GX1X1/2 BEV (NEEDLE) ×2 IMPLANT
NS IRRIG 1000ML POUR BTL (IV SOLUTION) ×3 IMPLANT
PACK CV ACCESS (CUSTOM PROCEDURE TRAY) ×3 IMPLANT
PAD ARMBOARD 7.5X6 YLW CONV (MISCELLANEOUS) ×6 IMPLANT
SET MICROPUNCTURE 5F STIFF (MISCELLANEOUS) IMPLANT
SUT ETHILON 3 0 PS 1 (SUTURE) ×3 IMPLANT
SUT MNCRL AB 4-0 PS2 18 (SUTURE) ×3 IMPLANT
SUT PROLENE 6 0 BV (SUTURE) IMPLANT
SUT SILK 0 TIES 10X30 (SUTURE) ×3 IMPLANT
SUT VIC AB 3-0 SH 27 (SUTURE) ×4
SUT VIC AB 3-0 SH 27X BRD (SUTURE) ×3 IMPLANT
SYR 10ML LL (SYRINGE) ×3 IMPLANT
SYR 20ML LL LF (SYRINGE) ×6 IMPLANT
SYR 5ML LL (SYRINGE) ×6 IMPLANT
SYR CONTROL 10ML LL (SYRINGE) ×3 IMPLANT
TOWEL GREEN STERILE (TOWEL DISPOSABLE) ×6 IMPLANT
TOWEL GREEN STERILE FF (TOWEL DISPOSABLE) ×3 IMPLANT
UNDERPAD 30X36 HEAVY ABSORB (UNDERPADS AND DIAPERS) ×3 IMPLANT
WATER STERILE IRR 1000ML POUR (IV SOLUTION) ×3 IMPLANT

## 2022-08-29 NOTE — Plan of Care (Signed)
°  Problem: Education: °Goal: Knowledge of General Education information will improve °Description: Including pain rating scale, medication(s)/side effects and non-pharmacologic comfort measures °Outcome: Progressing °  °Problem: Health Behavior/Discharge Planning: °Goal: Ability to manage health-related needs will improve °Outcome: Progressing °  °Problem: Clinical Measurements: °Goal: Will remain free from infection °Outcome: Progressing °  °Problem: Activity: °Goal: Risk for activity intolerance will decrease °Outcome: Progressing °  °Problem: Elimination: °Goal: Will not experience complications related to bowel motility °Outcome: Progressing °  °Problem: Skin Integrity: °Goal: Risk for impaired skin integrity will decrease °Outcome: Progressing °  °

## 2022-08-29 NOTE — ED Notes (Signed)
Obtained consent for procedure 

## 2022-08-29 NOTE — Op Note (Signed)
    NAME: SENECA HOBACK    MRN: [id number] DOB: Sep 21, 1946    DATE OF OPERATION: 08/29/2022  PREOP DIAGNOSIS:    Nonhealing wound left hand/end-stage renal disease  POSTOP DIAGNOSIS:    Same  PROCEDURE:    Ultrasound-guided placement of left IJ 28 cm tunneled dialysis catheter Ligation of left upper arm AV fistula  SURGEON: Judeth Cornfield. Scot Dock, MD  ASSIST: None  ANESTHESIA: Local with sedation  EBL: Minimal  INDICATIONS:    IVANN TRIMARCO is a 76 y.o. male who presented with an extensive wound of the left hand and osteomyelitis.  He had a functioning left upper arm fistula that was compromising blood flow.  I recommended ligation of the fistula and placement of a tunneled dialysis catheter  FINDINGS:   Biphasic radial signal at the completion of the procedure with a brisk ulnar signal.  TECHNIQUE:   The patient was taken to the operating room and sedated by anesthesia.  I looked at both IJ's with the SonoSite.  The right IJ appeared to be occluded.  The neck and upper chest were prepped and draped in usual sterile fashion.  Under ultrasound guidance, after the skin was anesthetized, I cannulated the left IJ with a micropuncture needle and a micropuncture sheath was introduced over a wire.  I then advanced the J-wire down into the right atrium.  The exit site for the catheter was selected and after the skin was anesthetized the catheter was brought through the tunnel.  I then dilated the tract over the wire.  The catheter was then passed through the peel-away sheath and positioned at the cavoatrial junction.  Both ports withdrew easily with and flushed with heparinized saline and filled with concentrated heparin.  The catheter was secured at its exit site with a 3-0 nylon suture.  The IJ cannulation site was closed with a 4-0 Monocryl.  A sterile dressing was applied.  Attention was then turned to the left arm.  The left arm was prepped and draped in usual sterile fashion.   After the skin was anesthetized, a transverse incision was made adjacent to the anastomosis.  The fistula here was dissected free.  I dissected down towards the anastomosis up to the brachial artery.  The artery was ligated here with 2-0 silk ties.  I then excised the segment that was somewhat aneurysmal and tortuous and ligated the vein distally.  Hemostasis was obtained in the wound.  The wound was closed with a deep layer of 3-0 Vicryl and the skin closed with 4-0 Monocryl.  Dermabond was applied.  The patient tolerated the procedure well was transferred to the recovery room in stable condition.  All needle and sponge counts were correct.  Deitra Mayo, MD, FACS [facility]  DATE OF DICTATION:   08/29/2022

## 2022-08-29 NOTE — Anesthesia Postprocedure Evaluation (Signed)
Anesthesia Post Note  Patient: [name]  Procedure(s) Performed: LIGATION OF ARTERIOVENOUS  FISTULA LEFT ARM (Left: Arm Upper) INSERTION OF DIALYSIS CATHETER (Left: Chest)     Patient location during evaluation: PACU Anesthesia Type: MAC Level of consciousness: awake and alert, patient cooperative and oriented Pain management: pain level controlled Vital Signs Assessment: post-procedure vital signs reviewed and stable Respiratory status: spontaneous breathing, nonlabored ventilation, respiratory function stable and patient connected to nasal cannula oxygen Cardiovascular status: stable and blood pressure returned to baseline Postop Assessment: no apparent nausea or vomiting Anesthetic complications: no   No notable events documented.  Last Vitals:  Vitals:   08/29/22 1700 08/29/22 1717  BP:  118/69  Pulse: 69 82  Resp: 15   Temp: 36.5 C 36.4 C  SpO2: 93% 96%    Last Pain:  Vitals:   08/29/22 1717  TempSrc: Oral  PainSc:                  Axel Frisk,E. Endy Easterly

## 2022-08-29 NOTE — Anesthesia Preprocedure Evaluation (Addendum)
Anesthesia Evaluation  Patient identified by MRN, date of birth, ID band Patient awake    Reviewed: Allergy & Precautions, NPO status , Patient's Chart, lab work & pertinent test results  History of Anesthesia Complications Negative for: history of anesthetic complications  Airway Mallampati: II  TM Distance: >3 FB Neck ROM: Full    Dental  (+) Poor Dentition, Dental Advisory Given   Pulmonary sleep apnea and Continuous Positive Airway Pressure Ventilation , former smoker,    breath sounds clear to auscultation       Cardiovascular hypertension, Pt. on medications + CAD, + Peripheral Vascular Disease and +CHF  + dysrhythmias Atrial Fibrillation  Rhythm:Irregular Rate:Normal  mitodrine  '20 cath: The LAD is known to be occluded since at least 2012.  The patient has moderate left main and RCA disease that does not require revascularization at the present time.  Continue aggressive medical therapy  '20 ECHO: EF 65-70%. Wall motion was normal; there were no regional wallmotion abnormalities.  - Aortic valve: Mildly calcified annulus. Trileaflet; mildly  thickened leaflets. Valve area (VTI): 2.67 cm^2. Valve area (Vmax): 2.64 cm^2. Valve area (Vmean): 2.84 cm^2.  - Mitral valve: Mildly calcified annulus. Mildly thickened leaflets. Valve area by continuity equation (using LVOT flow): 2.41 cm^2.  - Left atrium: The atrium was moderately dilated   Neuro/Psych  Neuromuscular disease (diabetic neuropathy)    GI/Hepatic negative GI ROS, Neg liver ROS,   Endo/Other  diabetes (glu 93), Insulin DependentBMI 35  Renal/GU Dialysis and ESRFRenal disease (TuThSa, K+ 5.0)     Musculoskeletal   Abdominal (+) + obese,   Peds  Hematology  (+) Blood dyscrasia (Hb 10.4), anemia , eliquis   Anesthesia Other Findings   Reproductive/Obstetrics                            Anesthesia Physical Anesthesia Plan  ASA:  4  Anesthesia Plan: MAC   Post-op Pain Management: Tylenol PO (pre-op)*   Induction:   PONV Risk Score and Plan: 1 and Ondansetron  Airway Management Planned: Natural Airway and Simple Face Mask  Additional Equipment: None  Intra-op Plan:   Post-operative Plan:   Informed Consent: I have reviewed the patients History and Physical, chart, labs and discussed the procedure including the risks, benefits and alternatives for the proposed anesthesia with the patient or authorized representative who has indicated his/her understanding and acceptance.     Dental advisory given and Consent reviewed with POA  Plan Discussed with: CRNA and Surgeon  Anesthesia Plan Comments: (Discussed with patient and his wife)       Anesthesia Quick Evaluation

## 2022-08-29 NOTE — Progress Notes (Signed)
Patient in OR today and unable to see and examine. Plan to provide recommendations in the morning. In the meantime, can continue with linezolid and cefazolin.  Elzie Rings [facility] for Infectious Diseases [phone]

## 2022-08-29 NOTE — Transfer of Care (Signed)
Immediate Anesthesia Transfer of Care Note  Patient: Louis [name]  Procedure(s) Performed: LIGATION OF ARTERIOVENOUS  FISTULA LEFT ARM (Left: Arm Upper) INSERTION OF DIALYSIS CATHETER (Left: Chest)  Patient Location: PACU  Anesthesia Type:MAC  Level of Consciousness: drowsy  Airway & Oxygen Therapy: Patient Spontanous Breathing and Patient connected to face mask oxygen  Post-op Assessment: Report given to RN and Post -op Vital signs reviewed and stable  Post vital signs: Reviewed and stable  Last Vitals:  Vitals Value Taken Time  BP 117/65 08/29/22 1630  Temp    Pulse 68 08/29/22 1631  Resp 17 08/29/22 1631  SpO2 100 % 08/29/22 1631  Vitals shown include unvalidated device data.  Last Pain:  Vitals:   08/29/22 1410  TempSrc:   PainSc: 4          Complications: No notable events documented.

## 2022-08-29 NOTE — Consult Note (Signed)
Reason for Consult:Bilateral hand osteo Referring Physician: Shelly Coss Time called: [id number] Time at bedside: Louis [name] is an 76 y.o. male.  HPI: Louis [name] has been dealing with bilateral finger wounds for about a year. These manifest as small punctate wounds that bleed. He also frequently loses nails. Yesterday he had dramatic swelling of his left thumb with increased pain and came to the ED. MRI showed osteo of distal phalanx of left thumb and of right ring finger and hand surgery was consulted.   Past Medical History:  Diagnosis Date   Arthritis    Brittle bones    per pt, has soft bones in right foot/wears boot cast!   CAD (coronary artery disease)    Cancer ([place])    skin cancer on arm   Cataract    Bil/ surg scheduled for right eye 01/18/17/ left eye 02/08/17   Charcot ankle, right 2019   CHF (congestive heart failure) ([place]) 2015   Diabetes mellitus    Type 2   Heart failure, diastolic ([place])    History of kidney stones    Hyperlipidemia    Hypertension    Macular degeneration disease    Macular edema 2014   OSA on CPAP    Paroxysmal atrial fibrillation ([place])    Personal history of colonic polyps - adenomas 01/28/2014   Renal insufficiency    Tu/Th/Sa Dialysis   Shortness of breath dyspnea    Syncope and collapse     Past Surgical History:  Procedure Laterality Date   AV FISTULA PLACEMENT Left 09/01/2020   Procedure: LEFT ARM ARTERIOVENOUS (AV) FISTULA;  Surgeon: Waynetta Sandy, MD;  Location: [place];  Service: Vascular;  Laterality: Left;   [place] Left 10/27/2020   Procedure: LEFT SECOND STAGE BASILIC VEIN FISTULA TRANSPOSITION;  Surgeon: Waynetta Sandy, MD;  Location: [place];  Service: Vascular;  Laterality: Left;   CARPAL TUNNEL RELEASE     left hand   COLONOSCOPY     INTRAVASCULAR PRESSURE WIRE/FFR STUDY N/A 12/18/2018   Procedure: INTRAVASCULAR PRESSURE WIRE/FFR STUDY;  Surgeon: Wellington Hampshire, MD;  [place]  [facility];  Service: Cardiovascular;  Laterality: N/A;   IR FLUORO GUIDE CV LINE RIGHT  12/16/2018   IR US GUIDE VASC ACCESS RIGHT  12/16/2018   KNEE ARTHROSCOPY Right 09/13/2016   [facility], Dr. Dorna Leitz   PILONIDAL CYST EXCISION     RIGHT/LEFT HEART CATH AND CORONARY ANGIOGRAPHY N/A 12/18/2018   Procedure: RIGHT/LEFT HEART CATH AND CORONARY ANGIOGRAPHY;  Surgeon: Wellington Hampshire, MD;  Location: [facility];  Service: Cardiovascular;  Laterality: N/A;    Family History  Problem Relation Age of Onset   Diabetes Mother    Kidney disease Mother    Diabetes Father    Coronary artery disease Father    Diabetes Brother    Diabetes Sister    Prostate cancer Maternal Uncle    Diabetes Maternal Grandmother    Cancer Paternal Grandmother        unknown   Heart attack Paternal Grandfather    Colon cancer Neg Hx     Social History:  reports that he quit smoking about 45 years ago. His smoking use included cigarettes. He has a 10.00 pack-year smoking history. He has never used smokeless tobacco. He reports that he does not drink alcohol and does not use drugs.  Allergies:  Allergies  Allergen Reactions   Hydrocodone Nausea And Vomiting  Other reaction(s): GI Upset (intolerance) Projectile vomiting    Oxycodone Nausea And Vomiting    Other reaction(s): GI Upset (intolerance), Vomiting (intolerance) Projectile vomiting    Vancomycin Shortness Of Breath    ORAL VANCOMYCIN for C diff.   Dacarbazine Other (See Comments)    Unknown reaction   Tape Dermatitis, Itching and Rash    Patch used at dialysis    Medications: I have reviewed the patient's current medications.  Results for orders placed or performed during the hospital encounter of 08/28/22 (from the past 48 hour(s))  Comprehensive metabolic panel     Status: Abnormal   Collection Time: 08/28/22 11:23 PM  Result Value Ref Range   Sodium 131 (L) 135 - 145 mmol/L   Potassium 4.7 3.5 - 5.1 mmol/L    Chloride 90 (L) 98 - 111 mmol/L   CO2 27 22 - 32 mmol/L   Glucose, Bld 137 (H) 70 - 99 mg/dL    Comment: Glucose reference range applies only to samples taken after fasting for at least 8 hours.   BUN 68 (H) 8 - 23 mg/dL   Creatinine, Ser 6.76 (H) 0.61 - 1.24 mg/dL   Calcium 8.6 (L) 8.9 - 10.3 mg/dL   Total Protein 6.5 6.5 - 8.1 g/dL   Albumin 2.4 (L) 3.5 - 5.0 g/dL   AST 26 15 - 41 U/L   ALT 23 0 - 44 U/L   Alkaline Phosphatase 248 (H) 38 - 126 U/L   Total Bilirubin 1.0 0.3 - 1.2 mg/dL   GFR, Estimated 8 (L) >60 mL/min    Comment: (NOTE) Calculated using the CKD-EPI Creatinine Equation (2021)    Anion gap 14 5 - 15    Comment: Performed at [facility], [place] [address] [place], [place] [address]  CBC with Differential     Status: Abnormal   Collection Time: 08/28/22 11:23 PM  Result Value Ref Range   WBC 16.3 (H) 4.0 - 10.5 K/uL   RBC 3.48 (L) 4.22 - 5.81 MIL/uL   Hemoglobin 10.4 (L) 13.0 - 17.0 g/dL   HCT 33.3 (L) 39.0 - 52.0 %   MCV 95.7 80.0 - 100.0 fL   MCH 29.9 26.0 - 34.0 pg   MCHC 31.2 30.0 - 36.0 g/dL   RDW 19.7 (H) 11.5 - 15.5 %   Platelets 215 150 - 400 K/uL   nRBC 0.0 0.0 - 0.2 %   Neutrophils Relative % 88 %   Neutro Abs 14.4 (H) 1.7 - 7.7 K/uL   Lymphocytes Relative 3 %   Lymphs Abs 0.5 (L) 0.7 - 4.0 K/uL   Monocytes Relative 8 %   Monocytes Absolute 1.3 (H) 0.1 - 1.0 K/uL   Eosinophils Relative 0 %   Eosinophils Absolute 0.1 0.0 - 0.5 K/uL   Basophils Relative 0 %   Basophils Absolute 0.0 0.0 - 0.1 K/uL   Immature Granulocytes 1 %   Abs Immature Granulocytes 0.09 (H) 0.00 - 0.07 K/uL    Comment: Performed at [facility] [address] [place], [place] [address]  Lactic acid, plasma     Status: None   Collection Time: 08/28/22 11:23 PM  Result Value Ref Range   Lactic Acid, Venous 1.5 0.5 - 1.9 mmol/L    Comment: Performed at [facility] [address], [place], [place] [address]  Magnesium     Status: Abnormal   Collection  Time: 08/28/22 11:23 PM  Result Value Ref Range  Magnesium 3.0 (H) 1.7 - 2.4 mg/dL    Comment: Performed at [facility], [place] [address], [place], [place] [address]  Lactic acid, plasma     Status: Abnormal   Collection Time: 08/29/22  5:39 AM  Result Value Ref Range   Lactic Acid, Venous 2.0 (HH) 0.5 - 1.9 mmol/L    Comment: CRITICAL RESULT CALLED TO, READ BACK BY AND VERIFIED WITH Vernia Buff, RN [phone] 08/29/22 L. KLAR Performed at [facility], [place] [address] [place], [place] [address]   Sedimentation rate     Status: Abnormal   Collection Time: 08/29/22  6:39 AM  Result Value Ref Range   Sed Rate 65 (H) 0 - 16 mm/hr    Comment: Performed at [facility] [address] [place], [place] [address]  C-reactive protein     Status: Abnormal   Collection Time: 08/29/22  6:39 AM  Result Value Ref Range   CRP 22.8 (H) <1.0 mg/dL    Comment: Performed at [facility] [address], [place], [place] [address]    DG Hand Complete Right  Result Date: 08/29/2022 CLINICAL DATA:  Osteomyelitis EXAM: RIGHT HAND - COMPLETE 3+ VIEW COMPARISON:  None Available. FINDINGS: Advanced vascular calcifications are seen within the right hand. There is a superficial wound involving the distal aspect of the ring finger. There are erosive changes involving the subjacent distal tuft of the fourth distal phalanx suggesting osteomyelitis in this location. There are, additionally, probable chronic erosive changes involving the distal tuft of the distal phalanges of the third digit and thumb. This may reflect the sequela of remote or chronic osteomyelitis. Alternatively, vasculopathy such is Raynaud's phenomena or thermal injuries such as frostbite could result in a similar appearance. Superimposed mild degenerative changes are noted. No acute fracture or dislocation. IMPRESSION: Superficial wound involving the distal aspect of the right ring finger with associated erosive changes involving the  subjacent distal tuft of the fourth distal phalanx suggesting osteomyelitis. Additional erosive changes involving the distal tuft of the distal phalanx of the thumb and long finger. See differential considerations above. Peripheral vascular disease. Electronically Signed   By: Fidela Salisbury M.D.   On: 08/29/2022 00:09   DG Finger Thumb Left  Result Date: 08/29/2022 CLINICAL DATA:  Left thumb pain, swelling, throbbing EXAM: LEFT THUMB 2+V COMPARISON:  None Available. FINDINGS: There is extensive soft tissue swelling of the left thumb with foci of subcutaneous gas noted. Superficial ulcer noted involving the distal aspect of the left thumb. There is subjacent erosive changes involving the distal tuft of the distal phalanx in keeping with osteomyelitis in this location. Advanced vascular calcifications are noted. Degenerative changes are noted within the interphalangeal joint and first metacarpophalangeal joint of the thumb. IMPRESSION: Extensive soft tissue swelling of the left thumb with subcutaneous gas and subjacent erosive changes involving the distal tuft of the distal phalanx in keeping with osteomyelitis. Electronically Signed   By: Fidela Salisbury M.D.   On: 08/29/2022 00:04   DG Hand Complete Left  Result Date: 08/29/2022 CLINICAL DATA:  Osteomyelitis EXAM: LEFT HAND - COMPLETE 3+ VIEW COMPARISON:  None Available. FINDINGS: Advanced vascular calcifications are seen within the left hand. There is diffuse soft tissue swelling of the left hand, most severely involving the thumb, the inner eminence, and dorsum of the left hand. There is subcutaneous gas identified within the soft tissues of the thumb. There is a superficial wound involving the distal aspect of  the thumb. There is subjacent erosive changes involving the distal tuft of the distal phalanx in keeping with osteomyelitis in this location. Erosive changes are noted involving the distal tuft of the index finger with surrounding soft tissue  swelling. No acute fracture or dislocation. Remaining joint spaces appear preserved. IMPRESSION: 1. Superficial wound involving the distal aspect of the thumb with subjacent erosive changes involving the distal tuft of the distal phalanx in keeping with osteomyelitis. 2. Subcutaneous gas within the soft tissues of the thumb. 3. Erosive changes involving the distal tuft of the index finger with associated soft tissue swelling in keeping with probable osteomyelitis in this location as well. Electronically Signed   By: Fidela Salisbury M.D.   On: 08/29/2022 00:02    Review of Systems  Constitutional:  Negative for chills, diaphoresis and fever.  HENT:  Negative for ear discharge, ear pain, hearing loss and tinnitus.   Eyes:  Negative for photophobia and pain.  Respiratory:  Negative for cough and shortness of breath.   Cardiovascular:  Negative for chest pain.  Gastrointestinal:  Negative for abdominal pain, nausea and vomiting.  Genitourinary:  Negative for dysuria, flank pain, frequency and urgency.  Musculoskeletal:  Positive for arthralgias (Bilateral hands). Negative for back pain, myalgias and neck pain.  Neurological:  Negative for dizziness and headaches.  Hematological:  Does not bruise/bleed easily.  Psychiatric/Behavioral:  The patient is not nervous/anxious.    Blood pressure 118/76, pulse 79, temperature (!) 97.5 F (36.4 C), temperature source Oral, resp. rate 18, height '5\' 7"'$  (1.702 m), weight 102.1 kg, SpO2 95 %. Physical Exam Constitutional:      General: He is not in acute distress.    Appearance: He is well-developed. He is not diaphoretic.  HENT:     Head: Normocephalic and atraumatic.  Eyes:     General: No scleral icterus.       Right eye: No discharge.        Left eye: No discharge.     Conjunctiva/sclera: Conjunctivae normal.  Cardiovascular:     Rate and Rhythm: Normal rate and regular rhythm.  Pulmonary:     Effort: Pulmonary effort is normal. No respiratory  distress.  Musculoskeletal:     Cervical back: Normal range of motion.     Comments: Bilateral shoulder, elbow, wrist, digits- Multiple punctate wounds multiple fingertips, several nail avulsions, left thumb with fusiform edema and mod TTP, no instability, no blocks to motion  Sens  Ax/R/M/U intact  Mot   Ax/ R/ PIN/ M/ AIN/ U intact  Rad absent but rad/uln/arch dopplerable  Skin:    General: Skin is warm and dry.  Neurological:     Mental Status: He is alert.  Psychiatric:        Mood and Affect: Mood normal.        Behavior: Behavior normal.     Assessment/Plan: Bilateral hand osteo -- Will need vascular consult to see if any of his UE arterial insufficiency is reversible. Then will need I&D +/- amputation of thumb and possibly ring finger. Will need ID involvement as well.    Lisette Abu, PA-C Orthopedic Surgery [phone] 08/29/2022, 9:39 AM

## 2022-08-29 NOTE — Consult Note (Addendum)
Hospital Consult  VASCULAR SURGERY ASSESSMENT & PLAN:   EXTENSIVE THUMB WOUND LEFT HAND: This patient has multiple wounds in both hands but an extensive wound involving the left thumb.  He has a functioning upper arm fistula and I would recommend ligation of his fistula to improve the blood flow.  We can further evaluate his arteries electively.  For now however it is critical to eliminate any steal that is occurring.  He will also require placement of a tunneled dialysis catheter.  I have discussed the indications for the procedure and the potential complications with the patient and he is agreeable to proceed.  Gae Gallop, MD 1:36 PM   Reason for Consult:  osteo UE digits Requesting Physician:  Tawanna Solo MRN #:  [id number]  History of Present Illness: This is a 76 y.o. male with ESRD who presented to the hospital with bilateral finger wounds.  He has been seen by hand surgery and it was felt he needed evaluation by Vascular Surgery prior to intervention.   He is here with his wife who helps provide history.  They tell me that he has been getting sores on his fingers on both hands for about a year.  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  His wife states that she would clean them and put a band aid on and they would normally heal.  His left thumb is very painful and hand surgery was consulted.  He states that his left hand is worse than his right.    He does not walk much.  His wife states that in the mornings he may walk a little with his walker but as the day goes on, he is in his wheelchair.  He denies any rest pain but states he gets cramping in his legs and the left is worse than the right.  He has significant swelling in both legs.  His wife states that one time in the hospital he did get pressure sore on his heel.    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 Donzetta Matters.  He was seen back in January 2022 and he was  doing well without steal symptoms.    The pt is right hand dominant. He is ESRD on HD T/T/S at the [facility] location.  He states he had his fistula evaluated at [facility] (Dr. Augustin Coupe) about a month ago and was told his fistula was fine.  This is his only dialysis access he has had.    Pt also has hx of CAD, DM, CHF.  The pt is on a statin for cholesterol management.  The pt is not on a daily aspirin.   Other AC:  Eliquis The pt is not on medication for hypertension.   The pt is diabetic.   Tobacco hx:  former  He and his wife have been married 31 years (both second marriage) and met when they went country dancing at [employer] with [place] concert.   Past Medical History:  Diagnosis Date   Arthritis    Brittle bones    per pt, has soft bones in right foot/wears boot cast!   CAD (coronary artery disease)    Cancer ([place])    skin cancer on arm   Cataract    Bil/ surg scheduled for right eye 01/18/17/ left eye 02/08/17   Charcot ankle, right 2019   CHF (congestive heart failure) ([place])  2015   Diabetes mellitus    Type 2   Heart failure, diastolic (HCC)    History of kidney stones    Hyperlipidemia    Hypertension    Macular degeneration disease    Macular edema 2014   OSA on CPAP    Paroxysmal atrial fibrillation (HCC)    Personal history of colonic polyps - adenomas 01/28/2014   Renal insufficiency    [facility]   Shortness of breath dyspnea    Syncope and collapse     Past Surgical History:  Procedure Laterality Date   AV FISTULA PLACEMENT Left 09/01/2020   Procedure: LEFT ARM ARTERIOVENOUS (AV) FISTULA;  Surgeon: Waynetta Sandy, MD;  Location: [place];  Service: Vascular;  Laterality: Left;   [place] Left 10/27/2020   Procedure: LEFT SECOND STAGE BASILIC VEIN FISTULA TRANSPOSITION;  Surgeon: Waynetta Sandy, MD;  Location: [place];  Service: Vascular;  Laterality: Left;   CARPAL TUNNEL RELEASE     left hand   COLONOSCOPY      INTRAVASCULAR PRESSURE WIRE/FFR STUDY N/A 12/18/2018   Procedure: INTRAVASCULAR PRESSURE WIRE/FFR STUDY;  Surgeon: Wellington Hampshire, MD;  Location: [place] [facility];  Service: Cardiovascular;  Laterality: N/A;   IR FLUORO GUIDE CV LINE RIGHT  12/16/2018   IR US GUIDE VASC ACCESS RIGHT  12/16/2018   KNEE ARTHROSCOPY Right 09/13/2016   [place] [facility], Dr. Dorna Leitz   PILONIDAL CYST EXCISION     RIGHT/LEFT HEART CATH AND CORONARY ANGIOGRAPHY N/A 12/18/2018   Procedure: RIGHT/LEFT HEART CATH AND CORONARY ANGIOGRAPHY;  Surgeon: Wellington Hampshire, MD;  Location: [place] [facility];  Service: Cardiovascular;  Laterality: N/A;    Allergies  Allergen Reactions   Hydrocodone Nausea And Vomiting    Other reaction(s): GI Upset (intolerance) Projectile vomiting    Oxycodone Nausea And Vomiting    Other reaction(s): GI Upset (intolerance), Vomiting (intolerance) Projectile vomiting    Vancomycin Shortness Of Breath    ORAL VANCOMYCIN for C diff.   Dacarbazine Other (See Comments)    Unknown reaction   Tape Dermatitis, Itching and Rash    Patch used at dialysis    Prior to Admission medications   Medication Sig Start Date End Date Taking? Authorizing Provider  AMBULATORY NON FORMULARY MEDICATION Continuous positive airway pressure (CPAP) device: Auto titrate minimum 4 cm H20 to maximum of 20 cm H2O with pressure. Please provide all supplemental supplies as needed. Fax to: [phone] 05/30/18   Emeterio Reeve, DO  AMBULATORY NON FORMULARY MEDICATION CPAP supplies - Disposable filters (white), Replaceable pillow or cushions, Mask and Tubing as needed. 01/13/21   [name], MD  B-D INS SYR ULTRAFINE 1CC/31G 31G X 5/16" 1 ML MISC  02/06/17   [provider]  blood glucose meter kit and supplies Use to check blood sugars daily E11.65 05/22/16   [provider]  Cholecalciferol (VITAMIN D3) 50 MCG (2000 UT) TABS Take 2,000 Units by mouth daily.     [provider]  Coenzyme Q10 (COQ10) 200 MG CAPS Take 200 mg by mouth daily.    [provider]  Continuous Blood Gluc Receiver (FREESTYLE LIBRE 14 DAY READER) DEVI Dx DM E11.22 Check blood sugar 4 times daily. 06/14/20   [name], MD  Continuous Blood Gluc Sensor (FREESTYLE LIBRE 14 DAY SENSOR) MISC Dx DM E11.22 Check blood sugar 4 times daily. 05/15/22   [name], MD  Doxercalciferol (HECTOROL IV) Doxercalciferol (Hectorol) 08/15/22 08/14/23  [provider]  ELIQUIS 5 MG TABS tablet Take 1 tablet (5 mg total) by mouth 2 (two) times daily. 07/07/22   Lelon Perla, MD  furosemide (LASIX) 40 MG tablet Take 40 mg by mouth daily as needed. 08/02/22   [provider]  iron sucrose in sodium chloride 0.9 % 100 mL Iron Sucrose (Venofer) 08/17/22 09/07/22  [provider]  isosorbide dinitrate (ISORDIL) 20 MG tablet Take 1 tablet by mouth twice daily Patient not taking: Reported on 08/28/2022 07/03/22   Lelon Perla, MD  lidocaine-prilocaine (EMLA) cream SMARTSIG:sparingly Topical As Directed 04/10/22   [provider]  Methoxy PEG-Epoetin Beta (MIRCERA IJ) Mircera 08/01/22 07/31/23  [provider]  midodrine (PROAMATINE) 10 MG tablet Take 10 mg by mouth as directed. 08/24/22   [provider]  nitroGLYCERIN (NITROSTAT) 0.4 MG SL tablet Place 1 tablet (0.4 mg total) under the tongue every 5 (five) minutes as needed for chest pain. 12/29/20   Lelon Perla, MD  NOVOLIN N 100 UNIT/ML injection Inject 20-30 Units into the skin See admin instructions. Inject 30 units subcutaneously in the morning & inject 20-30 units subcutaneously at bedtime (depending on blood sugar) 12/05/16   [provider]  NOVOLIN R 100 UNIT/ML injection Inject 8-20 Units into the skin 3 (three) times daily with meals. Sliding Scale 02/10/16   [provider]  omega-3 acid ethyl esters (LOVAZA) 1 g capsule Take 2 g by mouth daily.     [provider]  [facility] ULTRA test strip USE 1 STRIP TO CHECK GLUCOSE THREE TIMES DAILY 05/15/22   [name], MD  Probiotic Product (PROBIOTIC DAILY PO) Take 420 mg by mouth daily.    [provider]  rosuvastatin (CRESTOR) 40 MG tablet Take 1 tablet by mouth once daily 05/02/22   Lelon Perla, MD  silver sulfADIAZINE (SILVADENE) 1 % cream Apply topically. 02/28/22   [provider]  Vitamin Mixture (VITAMIN E COMPLETE PO) Take 1 capsule by mouth daily. Vitamin E complex    [provider]  ZINC SULFATE PO Take 1 tablet by mouth daily.     [provider]    Social History   Socioeconomic History   Marital status: Married    Spouse name: [name]   Number of children: 5   Years of education: 12   Highest education level: 12th grade  Occupational History   Occupation: Warehouse    Comment: retired  Tobacco Use   Smoking status: Former    Packs/day: 0.50    Years: 20.00    Total pack years: 10.00    Types: Cigarettes    Quit date: 03/06/1977    Years since quitting: 45.5   Smokeless tobacco: Never  Vaping Use   Vaping Use: Never used  Substance and Sexual Activity   Alcohol use: No   Drug use: No   Sexual activity: Not Currently  Other Topics Concern   Not on file  Social History Narrative   Lives with his wife. She is his caretaker. They have 18 grandchildren. He enjoys watching sports as he is not able to do much due to neuropathy.   Social Determinants of Health   Financial Resource Strain: Low Risk  (03/27/2022)   Overall Financial Resource Strain (CARDIA)    Difficulty of Paying Living Expenses: Not hard at all  Food Insecurity: No Food Insecurity (03/27/2022)   Hunger Vital Sign    Worried About Running Out of Food in the  Last Year: Never true    [place] in the Last Year: Never true  Transportation Needs: No Transportation Needs (03/27/2022)   PRAPARE - Hydrologist (Medical):  No    Lack of Transportation (Non-Medical): No  Physical Activity: Inactive (03/27/2022)   Exercise Vital Sign    Days of Exercise per Week: 0 days    Minutes of Exercise per Session: 0 min  Stress: No Stress Concern Present (03/27/2022)   [place]    Feeling of Stress : Not at all  Social Connections: Moderately Integrated (03/27/2022)   Social Connection and Isolation Panel [NHANES]    Frequency of Communication with Friends and Family: Twice a week    Frequency of Social Gatherings with Friends and Family: Once a week    Attends Religious Services: More than 4 times per year    Active Member of [employer] or Organizations: No    Attends Archivist Meetings: Never    Marital Status: Married  Human resources officer Violence: Not At Risk (03/27/2022)   Humiliation, Afraid, Rape, and Kick questionnaire    Fear of Current or Ex-Partner: No    Emotionally Abused: No    Physically Abused: No    Sexually Abused: No    Family History  Problem Relation Age of Onset   Diabetes Mother    Kidney disease Mother    Diabetes Father    Coronary artery disease Father    Diabetes Brother    Diabetes Sister    Prostate cancer Maternal Uncle    Diabetes Maternal Grandmother    Cancer Paternal Grandmother        unknown   Heart attack Paternal Grandfather    Colon cancer Neg Hx     ROS: _0  Positive   _1  Negative   _2  All sytems reviewed and are negative  Cardiac: _3  CHF _4  CAD   _5  PAF _6  HLD  Vascular: _7  pain in legs while walking _8  pain in legs at rest _9  pain in legs at night _10  non-healing ulcers _11  hx of DVT _12  swelling in legs  Pulmonary: _13  OSA   Neurologic: _14  hx of CVA _15  mini stroke   Hematologic: _16  hx of cancer  Endocrine:   _17  diabetes _18  thyroid disease  GI _19  GERD  GU: _20  CKD/renal failure 0_21  HD--_22  M/W/F or _23  T/T/S _24  kidney stones  Psychiatric: _25  anxiety _26   depression  Musculoskeletal: _27  arthritis _28  joint pain  Integumentary: _29  rashes _30  ulcers  Constitutional: _31  fever  _32  chills  Physical Examination  Vitals:   08/29/22 0900 08/29/22 0939  BP: 118/76   Pulse: 76 79  Resp: 17 18  Temp:  (!) 97.5 F (36.4 C)  SpO2: 93% 95%   Body mass index is 35.24 kg/m.  General:  WDWN in NAD Gait: Not observed HENT: WNL, normocephalic Pulmonary: normal non-labored breathing Cardiac: irregular Abdomen: obese Skin: without rashes Vascular Exam/Pulses: Bilateral biphasic ulnar doppler signals stronger than radial.  Faint palmar arch signals bilaterally.  Left arm doppler signals do not augment much with compression of the fistula.  Right foot with monophasic DP/PT/pero and left with DP/PT and faint pero Extremities: significant BLE swelling Left arm fistula with good thrill.    Left hand   Left hand   Right hand   Musculoskeletal: no muscle wasting or atrophy  Neurologic: A&O X 3 Psychiatric:  The pt has Normal affect.  CBC    Component Value Date/Time   WBC 16.3 (H) 08/28/2022 2323   RBC 3.48 (L) 08/28/2022 2323   HGB 10.4 (L) 08/28/2022 2323   HCT 33.3 (L) 08/28/2022 2323   PLT 215 08/28/2022 2323   MCV 95.7 08/28/2022 2323   MCV 101 09/09/2020 0000   MCH 29.9 08/28/2022 2323   MCHC 31.2 08/28/2022 2323   RDW 19.7 (H) 08/28/2022 2323   RDW 16.1 09/09/2020 0000   LYMPHSABS 0.5 (L) 08/28/2022 2323   MONOABS 1.3 (H) 08/28/2022 2323   EOSABS 0.1 08/28/2022 2323   BASOSABS 0.0 08/28/2022 2323    BMET    Component Value Date/Time   NA 131 (L) 08/28/2022 2323   NA 146 09/09/2020 0000   K 4.7 08/28/2022 2323   K 4.5 07/11/2012 2135   CL 90 (L) 08/28/2022 2323   CL 95 11/27/2017 0000   CO2 27 08/28/2022 2323   CO2 26 11/27/2017 0000   GLUCOSE 137 (H) 08/28/2022 2323   BUN 68 (H) 08/28/2022 2323   BUN 105 (A) 11/27/2017 0000   CREATININE 6.76 (H) 08/28/2022 2323   CREATININE 2.37 (H) 09/03/2018 0900    CALCIUM 8.6 (L) 08/28/2022 2323   CALCIUM 10.0 11/27/2017 0000   CALCIUM 9.1 05/28/2008 0000   GFRNONAA 8 (L) 08/28/2022 2323   GFRNONAA 24 (L) 05/07/2018 1410   GFRAA 18 (L) 10/27/2019 1211   GFRAA 28 (L) 05/07/2018 1410    COAGS: Lab Results  Component Value Date   INR 2.5 06/27/2021   INR 2.1 06/08/2021   INR 2.3 05/18/2021     Non-Invasive Vascular Imaging:   none  Left hand xray 08/29/2022: IMPRESSION: 1. Superficial wound involving the distal aspect of the thumb with subjacent erosive changes involving the distal tuft of the distal phalanx in keeping with osteomyelitis. 2. Subcutaneous gas within the soft tissues of the thumb. 3. Erosive changes involving the distal tuft of the index finger with associated soft tissue swelling in keeping with probable osteomyelitis in this location as well.  Right hand xray 08/29/2022: IMPRESSION: Superficial wound involving the distal aspect of the right ring finger with associated erosive changes involving the subjacent distal tuft of the fourth distal phalanx suggesting osteomyelitis.   Additional erosive changes involving the distal tuft of the distal phalanx of the thumb and long finger. See differential considerations above    ASSESSMENT/PLAN: This is a 76 y.o. male with ESRD with left BVT created in 2021 now with bilateral finger wounds with osteomyelitis left thumb   -pt with sores on both hands but left is worse than right.  He does have a left basilic vein transposition fistula in the left arm.  He does have biphasic ulnar signals bilaterally.  His fistula will most likely will need to be ligated and TDC placed.  He also may need upper extremity angiography to evaluate arterial flow.  -Dr. Scot Dock to evaluate pt and determine further plan   Leontine Locket, PA-C [facility] [phone]

## 2022-08-29 NOTE — H&P (Signed)
History and Physical  Patient Name: Louis [name]     BMW:[id number]    DOB: 1946/04/02    DOA: 08/28/2022 PCP: [name], MD  Chief Complaint: thumb drainage  HPI: Louis [name] is a 76 y.o. with history of ESRD on hemodialysis, type 2 diabetes, CHF, CAD, hyperlipidemia, hypertension presented to the ER at the recommendation of his failure medicine physician.  Patient had left hand swelling abdominal swelling for the last 2 days.  He has noticed bloody drainage starting yesterday so he presented to the ER.  He has had wounds on his hands for a year however never to this severity.  In the ER he had x-rays obtained which demonstrated extensive soft tissue swelling of the left thumb concern for osteomyelitis.  Other labs were obtained which showed CBC WBC 16.5, hemoglobin 10.7, sodium 131, potassium 4.7, creatinine 6.7, lactic acid 1.5, magnesium 3.  Hand surgery was consulted in the ED the patient was admitted for IV antibiotics.  He is allergic to vancomycin so he was given linezolid. ROS: Negative unless noted   Past Medical History:  Diagnosis Date   Arthritis    Brittle bones    per pt, has soft bones in right foot/wears boot cast!   CAD (coronary artery disease)    Cancer ([place])    skin cancer on arm   Cataract    Bil/ surg scheduled for right eye 01/18/17/ left eye 02/08/17   Charcot ankle, right 2019   CHF (congestive heart failure) ([place]) 2015   Diabetes mellitus    Type 2   Heart failure, diastolic ([place])    History of kidney stones    Hyperlipidemia    Hypertension    Macular degeneration disease    Macular edema 2014   OSA on CPAP    Paroxysmal atrial fibrillation ([place])    Personal history of colonic polyps - adenomas 01/28/2014   Renal insufficiency    [facility]   Shortness of breath dyspnea    Syncope and collapse     Past Surgical History:  Procedure Laterality Date   AV FISTULA PLACEMENT Left 09/01/2020   Procedure: LEFT ARM ARTERIOVENOUS (AV)  FISTULA;  Surgeon: Waynetta Sandy, MD;  Location: [facility];  Service: Vascular;  Laterality: Left;   [place] Left 10/27/2020   Procedure: LEFT SECOND STAGE BASILIC VEIN FISTULA TRANSPOSITION;  Surgeon: Waynetta Sandy, MD;  Location: [facility];  Service: Vascular;  Laterality: Left;   CARPAL TUNNEL RELEASE     left hand   COLONOSCOPY     INTRAVASCULAR PRESSURE WIRE/FFR STUDY N/A 12/18/2018   Procedure: INTRAVASCULAR PRESSURE WIRE/FFR STUDY;  Surgeon: Wellington Hampshire, MD;  Location: [facility];  Service: Cardiovascular;  Laterality: N/A;   IR FLUORO GUIDE CV LINE RIGHT  12/16/2018   IR US GUIDE VASC ACCESS RIGHT  12/16/2018   KNEE ARTHROSCOPY Right 09/13/2016   [facility], Dr. Dorna Leitz   PILONIDAL CYST EXCISION     RIGHT/LEFT HEART CATH AND CORONARY ANGIOGRAPHY N/A 12/18/2018   Procedure: RIGHT/LEFT HEART CATH AND CORONARY ANGIOGRAPHY;  Surgeon: Wellington Hampshire, MD;  Location: [facility];  Service: Cardiovascular;  Laterality: N/A;    Social History:  Allergies  Allergen Reactions   Hydrocodone Nausea And Vomiting    Other reaction(s): GI Upset (intolerance) Projectile vomiting    Oxycodone Nausea And Vomiting    Other reaction(s): GI Upset (intolerance), Vomiting (intolerance) Projectile vomiting    Vancomycin Shortness  Of Breath    ORAL VANCOMYCIN for C diff.   Dacarbazine Other (See Comments)    Unknown reaction   Tape Dermatitis, Itching and Rash    Patch used at dialysis    Family history: family history includes Cancer in his paternal grandmother; Coronary artery disease in his father; Diabetes in his brother, father, maternal grandmother, mother, and sister; Heart attack in his paternal grandfather; Kidney disease in his mother; Prostate cancer in his maternal uncle.  Prior to Admission medications   Medication Sig Start Date End Date Taking? Authorizing Provider  AMBULATORY NON FORMULARY MEDICATION Continuous  positive airway pressure (CPAP) device: Auto titrate minimum 4 cm H20 to maximum of 20 cm H2O with pressure. Please provide all supplemental supplies as needed. Fax to: [phone] 05/30/18   Emeterio Reeve, DO  AMBULATORY NON FORMULARY MEDICATION CPAP supplies - Disposable filters (white), Replaceable pillow or cushions, Mask and Tubing as needed. 01/13/21   [name], MD  B-D INS SYR ULTRAFINE 1CC/31G 31G X 5/16" 1 ML MISC  02/06/17   [provider]  blood glucose meter kit and supplies Use to check blood sugars daily E11.65 05/22/16   [provider]  Cholecalciferol (VITAMIN D3) 50 MCG (2000 UT) TABS Take 2,000 Units by mouth daily.     [provider]  Coenzyme Q10 (COQ10) 200 MG CAPS Take 200 mg by mouth daily.    [provider]  Continuous Blood Gluc Receiver (FREESTYLE LIBRE 14 DAY READER) DEVI Dx DM E11.22 Check blood sugar 4 times daily. 06/14/20   [name], MD  Continuous Blood Gluc Sensor (FREESTYLE LIBRE 14 DAY SENSOR) MISC Dx DM E11.22 Check blood sugar 4 times daily. 05/15/22   [name], MD  Doxercalciferol (HECTOROL IV) Doxercalciferol (Hectorol) 08/15/22 08/14/23  [provider]  ELIQUIS 5 MG TABS tablet Take 1 tablet (5 mg total) by mouth 2 (two) times daily. 07/07/22   Lelon Perla, MD  furosemide (LASIX) 40 MG tablet Take 40 mg by mouth daily as needed. 08/02/22   [provider]  iron sucrose in sodium chloride 0.9 % 100 mL Iron Sucrose (Venofer) 08/17/22 09/07/22  [provider]  isosorbide dinitrate (ISORDIL) 20 MG tablet Take 1 tablet by mouth twice daily Patient not taking: Reported on 08/28/2022 07/03/22   Lelon Perla, MD  lidocaine-prilocaine (EMLA) cream SMARTSIG:sparingly Topical As Directed 04/10/22   [provider]  Methoxy PEG-Epoetin Beta (MIRCERA IJ) Mircera 08/01/22 07/31/23  [provider]  midodrine (PROAMATINE) 10 MG tablet Take 10 mg by  mouth as directed. 08/24/22   [provider]  nitroGLYCERIN (NITROSTAT) 0.4 MG SL tablet Place 1 tablet (0.4 mg total) under the tongue every 5 (five) minutes as needed for chest pain. 12/29/20   Lelon Perla, MD  NOVOLIN N 100 UNIT/ML injection Inject 20-30 Units into the skin See admin instructions. Inject 30 units subcutaneously in the morning & inject 20-30 units subcutaneously at bedtime (depending on blood sugar) 12/05/16   [provider]  NOVOLIN R 100 UNIT/ML injection Inject 8-20 Units into the skin 3 (three) times daily with meals. Sliding Scale 02/10/16   [provider]  omega-3 acid ethyl esters (LOVAZA) 1 g capsule Take 2 g by mouth daily.    [provider]  [facility] ULTRA test strip USE 1 STRIP TO CHECK GLUCOSE THREE TIMES DAILY 05/15/22   [name], MD  Probiotic Product (PROBIOTIC DAILY PO) Take 420 mg by mouth  daily.    [provider]  rosuvastatin (CRESTOR) 40 MG tablet Take 1 tablet by mouth once daily 05/02/22   Lelon Perla, MD  silver sulfADIAZINE (SILVADENE) 1 % cream Apply topically. 02/28/22   [provider]  Vitamin Mixture (VITAMIN E COMPLETE PO) Take 1 capsule by mouth daily. Vitamin E complex    [provider]  ZINC SULFATE PO Take 1 tablet by mouth daily.     [provider]       Physical Exam: BP (!) 143/82   Pulse (!) 101   Temp 98.6 F (37 C) (Oral)   Resp (!) 22   SpO2 96%  General appearance: Not in acute distress Eyes: Anicteric, conjunctiva pink, lids and lashes normal. PERRL.    ENT: No nasal deformity, discharge, epistaxis.  Lymph: No cervical or supraclavicular lymphadenopathy. Skin: Warm and dry.  No jaundice.  No suspicious rashes or lesions. Cardiac: RRR, nl S1-S2, no murmurs appreciated. No edema Respiratory: Normal respiratory rate and rhythm. Abdomen: Abdomen soft, nontender.  MSK: Bloody drainage from left thumb Neuro: No focal neurologic  deficits.  Psych: Normal mood and affect     Labs on Admission:  I have personally reviewed following labs and imaging studies: CBC: Recent Labs  Lab 08/28/22 1630 08/28/22 2323  WBC 16.5* 16.3*  NEUTROABS 14,421* 14.4*  HGB 10.7* 10.4*  HCT 33.9* 33.3*  MCV 93.1 95.7  PLT 217 094   Basic Metabolic Panel: Recent Labs  Lab 08/28/22 2323  NA 131*  K 4.7  CL 90*  CO2 27  GLUCOSE 137*  BUN 68*  CREATININE 6.76*  CALCIUM 8.6*  MG 3.0*   GFR: CrCl cannot be calculated (Unknown ideal weight.).  Liver Function Tests: Recent Labs  Lab 08/28/22 2323  AST 26  ALT 23  ALKPHOS 248*  BILITOT 1.0  PROT 6.5  ALBUMIN 2.4*   No results for input(s): "LIPASE", "AMYLASE" in the last 168 hours. No results for input(s): "AMMONIA" in the last 168 hours. Coagulation Profile: No results for input(s): "INR", "PROTIME" in the last 168 hours. Cardiac Enzymes: No results for input(s): "CKTOTAL", "CKMB", "CKMBINDEX", "TROPONINI" in the last 168 hours. BNP (last 3 results) No results for input(s): "PROBNP" in the last 8760 hours.  Radiological Exams on Admission:  DG Hand Complete Right  Result Date: 08/29/2022 CLINICAL DATA:  Osteomyelitis EXAM: RIGHT HAND - COMPLETE 3+ VIEW COMPARISON:  None Available. FINDINGS: Advanced vascular calcifications are seen within the right hand. There is a superficial wound involving the distal aspect of the ring finger. There are erosive changes involving the subjacent distal tuft of the fourth distal phalanx suggesting osteomyelitis in this location. There are, additionally, probable chronic erosive changes involving the distal tuft of the distal phalanges of the third digit and thumb. This may reflect the sequela of remote or chronic osteomyelitis. Alternatively, vasculopathy such is Raynaud's phenomena or thermal injuries such as frostbite could result in a similar appearance. Superimposed mild degenerative changes are noted. No acute fracture or  dislocation. IMPRESSION: Superficial wound involving the distal aspect of the right ring finger with associated erosive changes involving the subjacent distal tuft of the fourth distal phalanx suggesting osteomyelitis. Additional erosive changes involving the distal tuft of the distal phalanx of the thumb and long finger. See differential considerations above. Peripheral vascular disease. Electronically Signed   By: Fidela Salisbury M.D.   On: 08/29/2022 00:09   DG Finger Thumb Left  Result Date: 08/29/2022 CLINICAL DATA:  Left  thumb pain, swelling, throbbing EXAM: LEFT THUMB 2+V COMPARISON:  None Available. FINDINGS: There is extensive soft tissue swelling of the left thumb with foci of subcutaneous gas noted. Superficial ulcer noted involving the distal aspect of the left thumb. There is subjacent erosive changes involving the distal tuft of the distal phalanx in keeping with osteomyelitis in this location. Advanced vascular calcifications are noted. Degenerative changes are noted within the interphalangeal joint and first metacarpophalangeal joint of the thumb. IMPRESSION: Extensive soft tissue swelling of the left thumb with subcutaneous gas and subjacent erosive changes involving the distal tuft of the distal phalanx in keeping with osteomyelitis. Electronically Signed   By: Fidela Salisbury M.D.   On: 08/29/2022 00:04   DG Hand Complete Left  Result Date: 08/29/2022 CLINICAL DATA:  Osteomyelitis EXAM: LEFT HAND - COMPLETE 3+ VIEW COMPARISON:  None Available. FINDINGS: Advanced vascular calcifications are seen within the left hand. There is diffuse soft tissue swelling of the left hand, most severely involving the thumb, the inner eminence, and dorsum of the left hand. There is subcutaneous gas identified within the soft tissues of the thumb. There is a superficial wound involving the distal aspect of the thumb. There is subjacent erosive changes involving the distal tuft of the distal phalanx in keeping  with osteomyelitis in this location. Erosive changes are noted involving the distal tuft of the index finger with surrounding soft tissue swelling. No acute fracture or dislocation. Remaining joint spaces appear preserved. IMPRESSION: 1. Superficial wound involving the distal aspect of the thumb with subjacent erosive changes involving the distal tuft of the distal phalanx in keeping with osteomyelitis. 2. Subcutaneous gas within the soft tissues of the thumb. 3. Erosive changes involving the distal tuft of the index finger with associated soft tissue swelling in keeping with probable osteomyelitis in this location as well. Electronically Signed   By: Fidela Salisbury M.D.   On: 08/29/2022 00:02     Assessment/Plan Mr. Rossano is a 76 year old with history of ESRD on hemodialysis who presented with thumb wound found to have osteomyelitis.  Hand surgery was consulted patient was admitted for IV antibiotics and possible surgical intervention.  Acute osteomyelitis of left thumb - X-ray findings consistent with gas gangrene and osteomyelitis-hand surgery consulted in the ED - Patient allergic to Vanco Plan: Continue linezolid, Zosyn maintain n.p.o. for possible surgical intervention Follow-up ESR, CRP  ESRD on hemodialysis-we will need to consult nephrology for dialysis  Hyperlipidemia-continue Crestor  Type 2 diabetes-placed on basal bolus regimen    DVT prophylaxis: hold due to possible procedure  Code Status: full  Disposition Plan: Anticipate dc 2d Consults called: hand surgery Admission status: obs   At the point of initial evaluation, it is my clinical opinion that admission for OBSERVATIONis reasonable and necessary because the patient's presenting complaints in the context of their chronic conditions represent sufficient risk of deterioration or significant morbidity to constitute reasonable grounds for close observation in the hospital setting, but that the patient may be medically  stable for discharge from the hospital within 24 to 48 hours.    Medical decision making: Patient seen at 5:51 AM on 08/29/2022. What exists of the patient's chart was reviewed in depth and summarized above.    Emilee Hero [facility] Please page though [place] or Epic secure chat:  For password, contact charge nurse

## 2022-08-29 NOTE — ED Notes (Signed)
Report given to short stay RN. All pt belongings given to pt's wife

## 2022-08-29 NOTE — ED Notes (Signed)
Per patients monitor CBG 102 at this time.

## 2022-08-29 NOTE — Anesthesia Procedure Notes (Signed)
Procedure Name: General with mask airway Date/Time: 08/29/2022 3:17 PM  Performed by: Erick Colace, CRNAPre-anesthesia Checklist: Patient identified, Emergency Drugs available, Suction available and Patient being monitored Patient Re-evaluated:Patient Re-evaluated prior to induction Oxygen Delivery Method: Simple face mask Preoxygenation: Pre-oxygenation with 100% oxygen Induction Type: IV induction Dental Injury: Teeth and Oropharynx as per pre-operative assessment

## 2022-08-29 NOTE — Progress Notes (Signed)
Brief same day note:  Patient is a 76 year old male with history of ESRD on dialysis on TTS, diabetes type 2, CHF, coronary artery disease, hyperlipidemia, hypertension who presented to the ER as per recommendation of her PCP for the evaluation of wound on his left thumb, bloody drainage on the fingers.  As per the patient, he had this finger wounds for last 6 months and was following with his PCP. X-ray of the hand done in the emergency department showed left thumb/ right fourth finger osteomyelitis.  Orthopedics consulted.  Vascular surgery also consulted for concern of arterial insufficiency.  ID also consulted as per orthopedics recommendation.  Nephrology consulted for dialysis. Patient seen and examined the bedside this morning.  During my evaluation, he was hemodynamically stable. His left thumb was black.  He has superficial wound on his bilateral fingers .he has bilateral lower extremity edema.  He has dialysis access on his left arm.  Wife was present at the bedside Patient has been started on broad spectrum antibiotics which we will continue. Continue current management.  Orthopedics planning for I&D +/- amputation of thumb and possibly right ring finger

## 2022-08-29 NOTE — ED Provider Notes (Signed)
[name] EMERGENCY DEPARTMENT Provider Note   CSN: [id number] Arrival date & time: 08/28/22  2145     History  Chief Complaint  Patient presents with   Wound Check    Louis [name] is a 76 y.o. male.  The history is provided by the patient.  Illness Location:  Hands B, left thumb is the worst Quality:  Wound and left thenar eminence is purple in color and markedly swollen Severity:  Severe Onset quality:  Gradual Duration: wounds and loss of nails on digits started 1 year ago. 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 Authorizing Provider  AMBULATORY NON FORMULARY MEDICATION Continuous positive airway pressure (CPAP) device: Auto titrate minimum 4 cm H20 to maximum of 20 cm H2O with pressure. Please provide all supplemental supplies as needed. Fax to: [phone] 05/30/18   Emeterio Reeve, DO  AMBULATORY NON FORMULARY MEDICATION CPAP supplies - Disposable filters (white), Replaceable pillow or cushions, Mask and Tubing as needed. 01/13/21   [name], MD  B-D INS SYR ULTRAFINE 1CC/31G 31G X 5/16" 1 ML MISC  02/06/17   [provider]  blood glucose meter kit and supplies Use to check blood sugars daily E11.65 05/22/16   [provider]  Cholecalciferol (VITAMIN D3) 50 MCG (2000 UT) TABS Take 2,000 Units by mouth daily.     [provider]  Coenzyme Q10 (COQ10) 200 MG CAPS Take 200 mg by mouth daily.    [provider]  Continuous Blood Gluc Receiver (FREESTYLE LIBRE 14 DAY READER) DEVI Dx DM E11.22 Check blood sugar 4 times daily. 06/14/20   [name], MD  Continuous Blood Gluc Sensor  (FREESTYLE LIBRE 14 DAY SENSOR) MISC Dx DM E11.22 Check blood sugar 4 times daily. 05/15/22   [name], MD  Doxercalciferol (HECTOROL IV) Doxercalciferol (Hectorol) 08/15/22 08/14/23  [provider]  ELIQUIS 5 MG TABS tablet Take 1 tablet (5 mg total) by mouth 2 (two) times daily. 07/07/22   Lelon Perla, MD  furosemide (LASIX) 40 MG tablet Take 40 mg by mouth daily as needed. 08/02/22   [provider]  iron sucrose in sodium chloride 0.9 % 100 mL Iron Sucrose (Venofer) 08/17/22 09/07/22  [provider]  isosorbide dinitrate (ISORDIL) 20 MG tablet Take 1 tablet by mouth twice daily Patient not taking: Reported on 08/28/2022 07/03/22   Lelon Perla, MD  lidocaine-prilocaine (EMLA) cream SMARTSIG:sparingly Topical As Directed 04/10/22   [provider]  Methoxy PEG-Epoetin Beta (MIRCERA IJ) Mircera 08/01/22 07/31/23  [provider]  midodrine (PROAMATINE) 10 MG tablet Take 10 mg by mouth as directed. 08/24/22   [provider]  nitroGLYCERIN (NITROSTAT) 0.4 MG SL tablet Place 1 tablet (0.4 mg total) under the tongue every 5 (five) minutes as needed for chest pain. 12/29/20   Lelon Perla, MD  NOVOLIN N 100 UNIT/ML injection Inject 20-30 Units into the skin See admin instructions. Inject 30 units subcutaneously in the morning & inject 20-30 units subcutaneously at bedtime (depending on blood sugar) 12/05/16   [provider]  NOVOLIN R 100 UNIT/ML injection Inject 8-20 Units into the skin 3 (three)  times daily with meals. Sliding Scale 02/10/16   [provider]  omega-3 acid ethyl esters (LOVAZA) 1 g capsule Take 2 g by mouth daily.    [provider]  [facility] ULTRA test strip USE 1 STRIP TO CHECK GLUCOSE THREE TIMES DAILY 05/15/22   [name], MD  Probiotic Product (PROBIOTIC DAILY PO) Take 420 mg by mouth daily.    [provider]  rosuvastatin (CRESTOR) 40 MG tablet Take 1 tablet by  mouth once daily 05/02/22   Lelon Perla, MD  silver sulfADIAZINE (SILVADENE) 1 % cream Apply topically. 02/28/22   [provider]  Vitamin Mixture (VITAMIN E COMPLETE PO) Take 1 capsule by mouth daily. Vitamin E complex    [provider]  ZINC SULFATE PO Take 1 tablet by mouth daily.     [provider]      Allergies    Hydrocodone, Oxycodone, Vancomycin, Dacarbazine, and Tape    Review of Systems   Review of Systems  Constitutional:  Negative for fever.  HENT:  Negative for facial swelling and sore throat.   Respiratory:  Negative for wheezing.   Gastrointestinal:  Negative for vomiting.  Musculoskeletal:  Positive for arthralgias.  Skin:  Positive for color change and wound.  All other systems reviewed and are negative.   Physical Exam Updated Vital Signs BP (!) 143/82   Pulse (!) 101   Temp 98.6 F (37 C) (Oral)   Resp (!) 22   SpO2 96%  Physical Exam Vitals and nursing note reviewed. Exam conducted with a chaperone present.  Constitutional:      General: He is not in acute distress.    Appearance: Normal appearance. He is well-developed. He is not diaphoretic.  HENT:     Head: Normocephalic and atraumatic.     Nose: Nose normal.  Eyes:     Conjunctiva/sclera: Conjunctivae normal.     Pupils: Pupils are equal, round, and reactive to light.  Cardiovascular:     Rate and Rhythm: Normal rate and regular rhythm.     Pulses: Normal pulses.     Heart sounds: Normal heart sounds.  Pulmonary:     Effort: Pulmonary effort is normal.     Breath sounds: No wheezing or rales.  Abdominal:     General: Bowel sounds are normal.     Palpations: Abdomen is soft.     Tenderness: There is no abdominal tenderness. There is no guarding or rebound.  Musculoskeletal:        General: Normal range of motion.       Hands:     Cervical back: Normal range of motion and neck supple.  Skin:    General: Skin is warm.  Neurological:     General: No focal  deficit present.     Mental Status: He is alert and oriented to person, place, and time.     Deep Tendon Reflexes: Reflexes normal.  Psychiatric:        Mood and Affect: Mood normal.     ED Results / Procedures / Treatments   Labs (all labs ordered are listed, but only abnormal results are displayed) Results for orders placed or performed during the hospital encounter of 08/28/22  Comprehensive metabolic panel  Result Value Ref Range   Sodium 131 (L) 135 - 145 mmol/L   Potassium 4.7 3.5 - 5.1 mmol/L   Chloride 90 (L) 98 - 111 mmol/L   CO2 27 22 - 32 mmol/L   Glucose,  Bld 137 (H) 70 - 99 mg/dL   BUN 68 (H) 8 - 23 mg/dL   Creatinine, Ser 6.76 (H) 0.61 - 1.24 mg/dL   Calcium 8.6 (L) 8.9 - 10.3 mg/dL   Total Protein 6.5 6.5 - 8.1 g/dL   Albumin 2.4 (L) 3.5 - 5.0 g/dL   AST 26 15 - 41 U/L   ALT 23 0 - 44 U/L   Alkaline Phosphatase 248 (H) 38 - 126 U/L   Total Bilirubin 1.0 0.3 - 1.2 mg/dL   GFR, Estimated 8 (L) >60 mL/min   Anion gap 14 5 - 15  CBC with Differential  Result Value Ref Range   WBC 16.3 (H) 4.0 - 10.5 K/uL   RBC 3.48 (L) 4.22 - 5.81 MIL/uL   Hemoglobin 10.4 (L) 13.0 - 17.0 g/dL   HCT 33.3 (L) 39.0 - 52.0 %   MCV 95.7 80.0 - 100.0 fL   MCH 29.9 26.0 - 34.0 pg   MCHC 31.2 30.0 - 36.0 g/dL   RDW 19.7 (H) 11.5 - 15.5 %   Platelets 215 150 - 400 K/uL   nRBC 0.0 0.0 - 0.2 %   Neutrophils Relative % 88 %   Neutro Abs 14.4 (H) 1.7 - 7.7 K/uL   Lymphocytes Relative 3 %   Lymphs Abs 0.5 (L) 0.7 - 4.0 K/uL   Monocytes Relative 8 %   Monocytes Absolute 1.3 (H) 0.1 - 1.0 K/uL   Eosinophils Relative 0 %   Eosinophils Absolute 0.1 0.0 - 0.5 K/uL   Basophils Relative 0 %   Basophils Absolute 0.0 0.0 - 0.1 K/uL   Immature Granulocytes 1 %   Abs Immature Granulocytes 0.09 (H) 0.00 - 0.07 K/uL  Lactic acid, plasma  Result Value Ref Range   Lactic Acid, Venous 1.5 0.5 - 1.9 mmol/L  Magnesium  Result Value Ref Range   Magnesium 3.0 (H) 1.7 - 2.4 mg/dL   DG Hand  Complete Right  Result Date: 08/29/2022 CLINICAL DATA:  Osteomyelitis EXAM: RIGHT HAND - COMPLETE 3+ VIEW COMPARISON:  None Available. FINDINGS: Advanced vascular calcifications are seen within the right hand. There is a superficial wound involving the distal aspect of the ring finger. There are erosive changes involving the subjacent distal tuft of the fourth distal phalanx suggesting osteomyelitis in this location. There are, additionally, probable chronic erosive changes involving the distal tuft of the distal phalanges of the third digit and thumb. This may reflect the sequela of remote or chronic osteomyelitis. Alternatively, vasculopathy such is Raynaud's phenomena or thermal injuries such as frostbite could result in a similar appearance. Superimposed mild degenerative changes are noted. No acute fracture or dislocation. IMPRESSION: Superficial wound involving the distal aspect of the right ring finger with associated erosive changes involving the subjacent distal tuft of the fourth distal phalanx suggesting osteomyelitis. Additional erosive changes involving the distal tuft of the distal phalanx of the thumb and long finger. See differential considerations above. Peripheral vascular disease. Electronically Signed   By: Fidela Salisbury M.D.   On: 08/29/2022 00:09   DG Finger Thumb Left  Result Date: 08/29/2022 CLINICAL DATA:  Left thumb pain, swelling, throbbing EXAM: LEFT THUMB 2+V COMPARISON:  None Available. FINDINGS: There is extensive soft tissue swelling of the left thumb with foci of subcutaneous gas noted. Superficial ulcer noted involving the distal aspect of the left thumb. There is subjacent erosive changes involving the distal tuft of the distal phalanx in keeping with osteomyelitis in this location. Advanced vascular calcifications  are noted. Degenerative changes are noted within the interphalangeal joint and first metacarpophalangeal joint of the thumb. IMPRESSION: Extensive soft tissue  swelling of the left thumb with subcutaneous gas and subjacent erosive changes involving the distal tuft of the distal phalanx in keeping with osteomyelitis. Electronically Signed   By: Fidela Salisbury M.D.   On: 08/29/2022 00:04   DG Hand Complete Left  Result Date: 08/29/2022 CLINICAL DATA:  Osteomyelitis EXAM: LEFT HAND - COMPLETE 3+ VIEW COMPARISON:  None Available. FINDINGS: Advanced vascular calcifications are seen within the left hand. There is diffuse soft tissue swelling of the left hand, most severely involving the thumb, the inner eminence, and dorsum of the left hand. There is subcutaneous gas identified within the soft tissues of the thumb. There is a superficial wound involving the distal aspect of the thumb. There is subjacent erosive changes involving the distal tuft of the distal phalanx in keeping with osteomyelitis in this location. Erosive changes are noted involving the distal tuft of the index finger with surrounding soft tissue swelling. No acute fracture or dislocation. Remaining joint spaces appear preserved. IMPRESSION: 1. Superficial wound involving the distal aspect of the thumb with subjacent erosive changes involving the distal tuft of the distal phalanx in keeping with osteomyelitis. 2. Subcutaneous gas within the soft tissues of the thumb. 3. Erosive changes involving the distal tuft of the index finger with associated soft tissue swelling in keeping with probable osteomyelitis in this location as well. Electronically Signed   By: Fidela Salisbury M.D.   On: 08/29/2022 00:02      Radiology DG Hand Complete Right  Result Date: 08/29/2022 CLINICAL DATA:  Osteomyelitis EXAM: RIGHT HAND - COMPLETE 3+ VIEW COMPARISON:  None Available. FINDINGS: Advanced vascular calcifications are seen within the right hand. There is a superficial wound involving the distal aspect of the ring finger. There are erosive changes involving the subjacent distal tuft of the fourth distal phalanx  suggesting osteomyelitis in this location. There are, additionally, probable chronic erosive changes involving the distal tuft of the distal phalanges of the third digit and thumb. This may reflect the sequela of remote or chronic osteomyelitis. Alternatively, vasculopathy such is Raynaud's phenomena or thermal injuries such as frostbite could result in a similar appearance. Superimposed mild degenerative changes are noted. No acute fracture or dislocation. IMPRESSION: Superficial wound involving the distal aspect of the right ring finger with associated erosive changes involving the subjacent distal tuft of the fourth distal phalanx suggesting osteomyelitis. Additional erosive changes involving the distal tuft of the distal phalanx of the thumb and long finger. See differential considerations above. Peripheral vascular disease. Electronically Signed   By: Fidela Salisbury M.D.   On: 08/29/2022 00:09   DG Finger Thumb Left  Result Date: 08/29/2022 CLINICAL DATA:  Left thumb pain, swelling, throbbing EXAM: LEFT THUMB 2+V COMPARISON:  None Available. FINDINGS: There is extensive soft tissue swelling of the left thumb with foci of subcutaneous gas noted. Superficial ulcer noted involving the distal aspect of the left thumb. There is subjacent erosive changes involving the distal tuft of the distal phalanx in keeping with osteomyelitis in this location. Advanced vascular calcifications are noted. Degenerative changes are noted within the interphalangeal joint and first metacarpophalangeal joint of the thumb. IMPRESSION: Extensive soft tissue swelling of the left thumb with subcutaneous gas and subjacent erosive changes involving the distal tuft of the distal phalanx in keeping with osteomyelitis. Electronically Signed   By: Fidela Salisbury M.D.   On: 08/29/2022  00:04   DG Hand Complete Left  Result Date: 08/29/2022 CLINICAL DATA:  Osteomyelitis EXAM: LEFT HAND - COMPLETE 3+ VIEW COMPARISON:  None Available.  FINDINGS: Advanced vascular calcifications are seen within the left hand. There is diffuse soft tissue swelling of the left hand, most severely involving the thumb, the inner eminence, and dorsum of the left hand. There is subcutaneous gas identified within the soft tissues of the thumb. There is a superficial wound involving the distal aspect of the thumb. There is subjacent erosive changes involving the distal tuft of the distal phalanx in keeping with osteomyelitis in this location. Erosive changes are noted involving the distal tuft of the index finger with surrounding soft tissue swelling. No acute fracture or dislocation. Remaining joint spaces appear preserved. IMPRESSION: 1. Superficial wound involving the distal aspect of the thumb with subjacent erosive changes involving the distal tuft of the distal phalanx in keeping with osteomyelitis. 2. Subcutaneous gas within the soft tissues of the thumb. 3. Erosive changes involving the distal tuft of the index finger with associated soft tissue swelling in keeping with probable osteomyelitis in this location as well. Electronically Signed   By: Fidela Salisbury M.D.   On: 08/29/2022 00:02    Procedures Procedures    Medications Ordered in ED Medications  linezolid (ZYVOX) IVPB 600 mg (has no administration in time range)  piperacillin-tazobactam (ZOSYN) IVPB 3.375 g (has no administration in time range)  piperacillin-tazobactam (ZOSYN) IVPB 2.25 g (has no administration in time range)  rosuvastatin (CRESTOR) tablet 40 mg (has no administration in time range)  acetaminophen (TYLENOL) tablet 650 mg (has no administration in time range)    Or  acetaminophen (TYLENOL) suppository 650 mg (has no administration in time range)  oxyCODONE (Oxy IR/ROXICODONE) immediate release tablet 5 mg (has no administration in time range)  Tdap (BOOSTRIX) injection 0.5 mL (0.5 mLs Intramuscular Given 08/29/22 0536)    ED Course/ Medical Decision Making/ A&P                            Medical Decision Making Patient with wounds on the fingers swollen thenar eminence x several days but wounds for one year   Problems Addressed: AKI (acute kidney injury) [facility]):    Details: Already on dialysis  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    Details: Previous notes and labs reviewed:  creatinine is uptrending from care everywhere  Labs: ordered.    Details: All labs reviewed: low sodium 131, normal potassium 4.7, elevated BUN 68, elevated creatinine 6.76.  elevated white count 16.3, low hemoglobin 10.4, normal platelets 215.  Blood cultures sent  Radiology: ordered and independent interpretation performed.    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 Tempie Donning who will see patient in consult  Dr. Annie Paras hospitalist will admit   Risk Prescription drug management. Decision regarding hospitalization.  Critical Care Total time providing critical care: 30 minutes (Complexity of diagnosis and treatment.  Multiple consults.  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  Rx / DC Orders ED Discharge Orders     None         Adron Geisel, MD 08/29/22 [phone]

## 2022-08-29 NOTE — Consult Note (Signed)
Renal Service Consult Note [facility]  Louis [name] 08/29/2022 Louis Blazing, MD Requesting Physician: Dr. Tawanna [name]  Reason for Consult: ESRD pt w/ gangrene of L thumb HPI: The patient is a 76 y.o. year-old w/ hx of CAD, DM2, charcot foot, CHF, HL, PAF, OSA, ESRD on HD who presented to ED last night w/ c/o worsening wounds, pain, redness of this L hand and thumb. Has had wound healing issues w/ his hand for about 6 m os, now thumb is darkened purple / red w/ decreased sensation and some drainage noted out of the wound at the tip of the thumb. In ED WBC 16K, Hb 10.7, K 4.7  creat 6.7, LA 1.5. Pt allergic to vancomycin so was given linezolid. Pt was admitted. We are asked to see him for ESRD.    Pt is on HD TTS x 41yr, using LUA AVF, tTS HD. Denies any SOB. Has sig swelling of the legs, this is not something new though. Has difficulty ambulating. Follows 32 oz fluid restriction.   ROS - denies CP, no joint pain, no HA, no blurry vision, no rash, no diarrhea, no nausea/ vomiting, no dysuria, no difficulty voiding   Past Medical History  Past Medical History:  Diagnosis Date   Arthritis    Brittle bones    per pt, has soft bones in right foot/wears boot cast!   CAD (coronary artery disease)    [place]    skin cancer on arm   Cataract    Bil/ surg scheduled for right eye 01/18/17/ left eye 02/08/17   Charcot ankle, right 2019   CH[place]estive heart failure[place]   Diabetes mellitus    Type 2   Heart failure, diastolic ([place]    History of kidney stones    Hyperlipidemia    Hypertension    Macular degeneration disease    Macular edema 2014   OSA on CPAP    Paroxysmal atrial fibrillation (HCC)    Personal history of colonic polyps - adenomas 01/28/2014   Renal insufficiency    Tu/Th/Sa Dialysis   Shortness of breath dyspnea    Syncope and collapse    Past Surgical History  Past Surgical History:  Procedure Laterality Date   AV FISTULA PLACEMENT  Left 09/01/2020   Procedure: LEFT ARM ARTERIOVENOUS (AV) FISTULA;  Surgeon: Louis Sandy MD;  [place]  Service: [place]ascular;  [place]ality: Left;   [place]   Procedure: LEFT SECOND STAGE BASILIC VEIN FISTULA TRANSPOSITION;  Surgeon: Louis Sandy MD;  [place]  Service: Vascular;  Laterality: Left;   CARPAL TUNNEL RELEASE     left hand   COLONOSCOPY     INTRAVASCULAR PRESSURE WIRE/FFR STUDY N/A 12/18/2018   Procedure: INTRAVASCULAR PRESSURE WIRE/FFR STUDY;  Surgeon: AWellington Hampshire MD;  [place];  Service: Cardiovascular;  Laterality: N/A;   IR FLUORO GUIDE CV LINE RIGHT  12/16/2018   IR [place] ACC[place] RIGHT  12/16/2018   KNEE ARTHROSCOPY Right 09/13/2016   [facility], Dr. JDorna Leitz  PILONIDAL CYST EXCISION     RIGHT/LEFT HEART CATH AND CORONARY ANGIOGRAPHY N/A 12/18/2018   Procedure: RIGHT/LEFT HEART CATH AND CORONARY ANGIOGRAPHY;  Surgeon: AWellington Hampshire MD;  [facility]  Service: Cardiovascular;  Laterality: N/A;   Family History  Family History  Problem Relation Age of Onset   Diabetes Mother    Kidney disease Mother  Diabetes Father    Coronary artery disease Father    Diabetes Brother    Diabetes Sister    Prostate cancer Maternal Uncle    Diabetes Maternal Grandmother    Cancer Paternal Grandmother        unknown   Heart attack Paternal Grandfather    Colon cancer Neg Hx    Social History  reports that he quit smoking about 45 years ago. His smoking use included cigarettes. He has a 10.00 pack-year smoking history. He has never used smokeless tobacco. He reports that he does not drink alcohol and does not use drugs. Allergies  Allergies  Allergen Reactions   Hydrocodone Nausea And Vomiting    Other reaction(s): GI Upset (intolerance) Projectile vomiting    Oxycodone Nausea And Vomiting    Other reaction(s): GI Upset (intolerance),  Vomiting (intolerance) Projectile vomiting    Vancomycin Shortness Of Breath    ORAL VANCOMYCIN for C diff.   Dacarbazine Other (See Comments)    Unknown reaction   Tape Dermatitis, Itching and Rash    Patch used at dialysis   Home medications Prior to Admission medications   Medication Sig Start Date End Date Taking? Authorizing Provider  AMBULATORY NON FORMULARY MEDICATION Continuous positive airway pressure (CPAP) device: Auto titrate minimum 4 cm H20 to maximum of 20 cm H2O with pressure. Please provide all supplemental supplies as needed. Fax to: [phone] 05/30/18   Louis Reeve, DO  AMBULATORY NON FORMULARY MEDICATION CPAP supplies - Disposable filters (white), Replaceable pillow or cushions, Mask and Tubing as needed. 01/13/21   Louis Marry, MD  B-D INS SYR ULTRAFINE 1CC/31G 31G X 5/16" 1 ML MISC  02/06/17   [provider]  blood glucose meter kit and supplies Use to check blood sugars daily E11.65 05/22/16   [provider]  Cholecalciferol (VITAMIN D3) 50 MCG (2000 UT) TABS Take 2,000 Units by mouth daily.     [provider]  Coenzyme Q10 (COQ10) 200 MG CAPS Take 200 mg by mouth daily.    [provider]  Continuous Blood Gluc Receiver (FREESTYLE LIBRE 14 DAY READER) DEVI Dx DM E11.22 Check blood sugar 4 times daily. 06/14/20   Louis Marry, MD  Continuous Blood Gluc Sensor (FREESTYLE LIBRE 14 DAY SENSOR) MISC Dx DM E11.22 Check blood sugar 4 times daily. 05/15/22   Louis Marry, MD  Doxercalciferol (HECTOROL IV) Doxercalciferol (Hectorol) 08/15/22 08/14/23  [provider]  ELIQUIS 5 MG TABS tablet Take 1 tablet (5 mg total) by mouth 2 (two) times daily. 07/07/22   Lelon Perla, MD  furosemide (LASIX) 40 MG tablet Take 40 mg by mouth daily as needed. 08/02/22   [provider]  iron sucrose in sodium chloride 0.9 % 100 mL Iron Sucrose (Venofer) 08/17/22 09/07/22  [provider]   isosorbide dinitrate (ISORDIL) 20 MG tablet Take 1 tablet by mouth twice daily Patient not taking: Reported on 08/28/2022 07/03/22   Lelon Perla, MD  lidocaine-prilocaine (EMLA) cream SMARTSIG:sparingly Topical As Directed 04/10/22   [provider]  Methoxy PEG-Epoetin Beta (MIRCERA IJ) Mircera 08/01/22 07/31/23  [provider]  midodrine (PROAMATINE) 10 MG tablet Take 10 mg by mouth as directed. 08/24/22   [provider]  nitroGLYCERIN (NITROSTAT) 0.4 MG SL tablet Place 1 tablet (0.4 mg total) under the tongue every 5 (five) minutes as needed for chest pain. 12/29/20   Lelon Perla, MD  NOVOLIN N 100 UNIT/ML injection Inject 20-30 Units into  the skin See admin instructions. Inject 30 units subcutaneously in the morning & inject 20-30 units subcutaneously at bedtime (depending on blood sugar) 12/05/16   [provider]  NOVOLIN R 100 UNIT/ML injection Inject 8-20 Units into the skin 3 (three) times daily with meals. Sliding Scale 02/10/16   [provider]  omega-3 acid ethyl esters (LOVAZA) 1 g capsule Take 2 g by mouth daily.    [provider]  [facility] ULTRA test strip USE 1 STRIP TO CHECK GLUCOSE THREE TIMES DAILY 05/15/22   Louis Marry, MD  Probiotic Product (PROBIOTIC DAILY PO) Take 420 mg by mouth daily.    [provider]  rosuvastatin (CRESTOR) 40 MG tablet Take 1 tablet by mouth once daily 05/02/22   Lelon Perla, MD  silver sulfADIAZINE (SILVADENE) 1 % cream Apply topically. 02/28/22   [provider]  Vitamin Mixture (VITAMIN E COMPLETE PO) Take 1 capsule by mouth daily. Vitamin E complex    [provider]  ZINC SULFATE PO Take 1 tablet by mouth daily.     [provider]     Vitals:   08/29/22 0800 08/29/22 0830 08/29/22 0900 08/29/22 0939  BP: 108/64 (!) 124/40 118/76   Pulse:  72 76 79  Resp: (!) _0 Temp:    (!) 97.5 F (36.4 C)  TempSrc:    Oral  SpO2:  96%  93% 95%  Weight:      Height:       Exam Gen alert, no distress; chronically ill appearing, on RA No rash, cyanosis or gangrene Sclera anicteric, throat clear  No jvd or bruits Chest clear bilat to bases, no rales/ wheezing RRR no MRG Abd soft ntnd no mass or ascites +bs GU normal male MS no joint effusions or deformity Ext diffuse 2-3+ pitting edema bilat LE's from feet to hips Neuro is alert, Ox 3 , nf    LUA AVF+bruit   Home meds include - eliquis, furosemide 40 qd, isordil 20, midodrine 10 prn, novolin insulin, rosuvastatin, vits/ supps / prns   OP HD: TTS SW 4h 450/ 500  101 kg  3K/2.5 bath  Hep none LUA AVF - last HD post HD 101.6kg on 10/7 - last Hb 10.0 10/5 - mircera 50 q 2, last 9/26 - venofer 100 mg tiw IV thru 10/19 - doxercalciferol 5 ug ttw IV    BP 120 70, HR 75 afib  RR 15-22 ,  afeb 100% on RA    K 4.7  CO2 27  BUN 68  Cr 6.7  alb 2.4  WBC 16k  Hb 10.4  Assessment/ Plan: 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  ESRD - on HD TTS. HD today.  DM2 - on insulin BP/ volume overload - BP's stable here. Has significant bilat LE pitting edema, lungs clear on exam and on RA. Was just started on pre HD midodrine 54m tts, will continue while here.  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 Hold IV Fe hold given infection.  MBD ckd - CCa in range, add on phos. Cont IV vdra.       RKelly Splinter MD 08/29/2022, 10:03 AM Recent Labs  Lab 08/28/22 1630 08/28/22 2323  HGB 10.7* 10.4*  ALBUMIN  --  2.4*  CALCIUM  --  8.6*  CREATININE  --  6.76*  K  --  4.7   Inpatient medications:  insulin aspart  0-24 Units Subcutaneous TID WC   insulin aspart  4 Units Subcutaneous TID WC   insulin detemir  20 Units Subcutaneous Daily   rosuvastatin  40 mg Oral Daily    sodium chloride     linezolid (ZYVOX) IV Stopped (08/29/22 0834)   piperacillin-tazobactam (ZOSYN)  IV      acetaminophen **OR** acetaminophen, oxyCODONE, traMADol

## 2022-08-29 NOTE — ED Notes (Signed)
IV team RN at bedside. ED RN x 2 unsuccessful PIV attempts.

## 2022-08-29 NOTE — ED Notes (Signed)
Pt checks his on CBG: this reading  was 112. No insulin needed

## 2022-08-30 ENCOUNTER — Inpatient Hospital Stay (HOSPITAL_COMMUNITY): Payer: Medicare Other | Admitting: Certified Registered"

## 2022-08-30 ENCOUNTER — Encounter (HOSPITAL_COMMUNITY)
Admission: EM | Disposition: A | Discharge status: Skilled Nursing Facility | Payer: Self-pay | Source: Home / Self Care | Attending: Family Medicine

## 2022-08-30 ENCOUNTER — Encounter (HOSPITAL_COMMUNITY): Payer: Self-pay | Admitting: Vascular Surgery

## 2022-08-30 DIAGNOSIS — K219 Gastro-esophageal reflux disease without esophagitis: Secondary | ICD-10-CM | POA: Diagnosis not present

## 2022-08-30 DIAGNOSIS — E1169 Type 2 diabetes mellitus with other specified complication: Secondary | ICD-10-CM | POA: Diagnosis present

## 2022-08-30 DIAGNOSIS — A480 Gas gangrene: Secondary | ICD-10-CM

## 2022-08-30 DIAGNOSIS — N179 Acute kidney failure, unspecified: Secondary | ICD-10-CM | POA: Diagnosis present

## 2022-08-30 DIAGNOSIS — E11628 Type 2 diabetes mellitus with other skin complications: Secondary | ICD-10-CM | POA: Diagnosis not present

## 2022-08-30 DIAGNOSIS — K222 Esophageal obstruction: Secondary | ICD-10-CM | POA: Diagnosis not present

## 2022-08-30 DIAGNOSIS — Z719 Counseling, unspecified: Secondary | ICD-10-CM | POA: Diagnosis not present

## 2022-08-30 DIAGNOSIS — L089 Local infection of the skin and subcutaneous tissue, unspecified: Secondary | ICD-10-CM | POA: Diagnosis not present

## 2022-08-30 DIAGNOSIS — Z8619 Personal history of other infectious and parasitic diseases: Secondary | ICD-10-CM | POA: Diagnosis not present

## 2022-08-30 DIAGNOSIS — K224 Dyskinesia of esophagus: Secondary | ICD-10-CM | POA: Diagnosis not present

## 2022-08-30 DIAGNOSIS — K521 Toxic gastroenteritis and colitis: Secondary | ICD-10-CM | POA: Diagnosis not present

## 2022-08-30 DIAGNOSIS — G4733 Obstructive sleep apnea (adult) (pediatric): Secondary | ICD-10-CM

## 2022-08-30 DIAGNOSIS — I959 Hypotension, unspecified: Secondary | ICD-10-CM | POA: Diagnosis not present

## 2022-08-30 DIAGNOSIS — R1310 Dysphagia, unspecified: Secondary | ICD-10-CM | POA: Diagnosis not present

## 2022-08-30 DIAGNOSIS — Z7901 Long term (current) use of anticoagulants: Secondary | ICD-10-CM | POA: Diagnosis not present

## 2022-08-30 DIAGNOSIS — I96 Gangrene, not elsewhere classified: Secondary | ICD-10-CM | POA: Diagnosis not present

## 2022-08-30 DIAGNOSIS — M6281 Muscle weakness (generalized): Secondary | ICD-10-CM | POA: Diagnosis not present

## 2022-08-30 DIAGNOSIS — R41841 Cognitive communication deficit: Secondary | ICD-10-CM | POA: Diagnosis not present

## 2022-08-30 DIAGNOSIS — A4902 Methicillin resistant Staphylococcus aureus infection, unspecified site: Secondary | ICD-10-CM | POA: Diagnosis not present

## 2022-08-30 DIAGNOSIS — M8618 Other acute osteomyelitis, other site: Secondary | ICD-10-CM

## 2022-08-30 DIAGNOSIS — K3189 Other diseases of stomach and duodenum: Secondary | ICD-10-CM | POA: Diagnosis not present

## 2022-08-30 DIAGNOSIS — Z4781 Encounter for orthopedic aftercare following surgical amputation: Secondary | ICD-10-CM | POA: Diagnosis not present

## 2022-08-30 DIAGNOSIS — Z992 Dependence on renal dialysis: Secondary | ICD-10-CM | POA: Diagnosis not present

## 2022-08-30 DIAGNOSIS — R1319 Other dysphagia: Secondary | ICD-10-CM | POA: Diagnosis not present

## 2022-08-30 DIAGNOSIS — R2681 Unsteadiness on feet: Secondary | ICD-10-CM | POA: Diagnosis not present

## 2022-08-30 DIAGNOSIS — I5032 Chronic diastolic (congestive) heart failure: Secondary | ICD-10-CM | POA: Diagnosis present

## 2022-08-30 DIAGNOSIS — M869 Osteomyelitis, unspecified: Secondary | ICD-10-CM | POA: Diagnosis not present

## 2022-08-30 DIAGNOSIS — N186 End stage renal disease: Secondary | ICD-10-CM | POA: Diagnosis present

## 2022-08-30 DIAGNOSIS — K259 Gastric ulcer, unspecified as acute or chronic, without hemorrhage or perforation: Secondary | ICD-10-CM | POA: Diagnosis not present

## 2022-08-30 DIAGNOSIS — L98499 Non-pressure chronic ulcer of skin of other sites with unspecified severity: Secondary | ICD-10-CM | POA: Diagnosis present

## 2022-08-30 DIAGNOSIS — Z794 Long term (current) use of insulin: Secondary | ICD-10-CM

## 2022-08-30 DIAGNOSIS — E1142 Type 2 diabetes mellitus with diabetic polyneuropathy: Secondary | ICD-10-CM | POA: Diagnosis not present

## 2022-08-30 DIAGNOSIS — D631 Anemia in chronic kidney disease: Secondary | ICD-10-CM | POA: Diagnosis present

## 2022-08-30 DIAGNOSIS — G4730 Sleep apnea, unspecified: Secondary | ICD-10-CM | POA: Diagnosis not present

## 2022-08-30 DIAGNOSIS — I10 Essential (primary) hypertension: Secondary | ICD-10-CM | POA: Diagnosis not present

## 2022-08-30 DIAGNOSIS — E871 Hypo-osmolality and hyponatremia: Secondary | ICD-10-CM | POA: Diagnosis present

## 2022-08-30 DIAGNOSIS — M86342 Chronic multifocal osteomyelitis, left hand: Secondary | ICD-10-CM | POA: Diagnosis present

## 2022-08-30 DIAGNOSIS — I509 Heart failure, unspecified: Secondary | ICD-10-CM | POA: Diagnosis not present

## 2022-08-30 DIAGNOSIS — I482 Chronic atrial fibrillation, unspecified: Secondary | ICD-10-CM | POA: Diagnosis not present

## 2022-08-30 DIAGNOSIS — K257 Chronic gastric ulcer without hemorrhage or perforation: Secondary | ICD-10-CM | POA: Diagnosis present

## 2022-08-30 DIAGNOSIS — M86142 Other acute osteomyelitis, left hand: Secondary | ICD-10-CM | POA: Diagnosis present

## 2022-08-30 DIAGNOSIS — R933 Abnormal findings on diagnostic imaging of other parts of digestive tract: Secondary | ICD-10-CM | POA: Diagnosis not present

## 2022-08-30 DIAGNOSIS — I82C11 Acute embolism and thrombosis of right internal jugular vein: Secondary | ICD-10-CM | POA: Diagnosis present

## 2022-08-30 DIAGNOSIS — Z9989 Dependence on other enabling machines and devices: Secondary | ICD-10-CM | POA: Diagnosis not present

## 2022-08-30 DIAGNOSIS — M79609 Pain in unspecified limb: Secondary | ICD-10-CM | POA: Diagnosis not present

## 2022-08-30 DIAGNOSIS — J690 Pneumonitis due to inhalation of food and vomit: Secondary | ICD-10-CM | POA: Diagnosis not present

## 2022-08-30 DIAGNOSIS — Z23 Encounter for immunization: Secondary | ICD-10-CM | POA: Diagnosis present

## 2022-08-30 DIAGNOSIS — I132 Hypertensive heart and chronic kidney disease with heart failure and with stage 5 chronic kidney disease, or end stage renal disease: Secondary | ICD-10-CM | POA: Diagnosis present

## 2022-08-30 DIAGNOSIS — Z89012 Acquired absence of left thumb: Secondary | ICD-10-CM | POA: Diagnosis not present

## 2022-08-30 DIAGNOSIS — D62 Acute posthemorrhagic anemia: Secondary | ICD-10-CM | POA: Diagnosis not present

## 2022-08-30 DIAGNOSIS — E1152 Type 2 diabetes mellitus with diabetic peripheral angiopathy with gangrene: Secondary | ICD-10-CM | POA: Diagnosis present

## 2022-08-30 DIAGNOSIS — E7800 Pure hypercholesterolemia, unspecified: Secondary | ICD-10-CM

## 2022-08-30 DIAGNOSIS — E11610 Type 2 diabetes mellitus with diabetic neuropathic arthropathy: Secondary | ICD-10-CM | POA: Diagnosis not present

## 2022-08-30 DIAGNOSIS — Z7401 Bed confinement status: Secondary | ICD-10-CM | POA: Diagnosis not present

## 2022-08-30 DIAGNOSIS — M86141 Other acute osteomyelitis, right hand: Secondary | ICD-10-CM | POA: Diagnosis present

## 2022-08-30 DIAGNOSIS — M86641 Other chronic osteomyelitis, right hand: Secondary | ICD-10-CM | POA: Diagnosis not present

## 2022-08-30 DIAGNOSIS — J90 Pleural effusion, not elsewhere classified: Secondary | ICD-10-CM | POA: Diagnosis not present

## 2022-08-30 DIAGNOSIS — I110 Hypertensive heart disease with heart failure: Secondary | ICD-10-CM | POA: Diagnosis not present

## 2022-08-30 DIAGNOSIS — L03012 Cellulitis of left finger: Secondary | ICD-10-CM | POA: Diagnosis not present

## 2022-08-30 DIAGNOSIS — I2510 Atherosclerotic heart disease of native coronary artery without angina pectoris: Secondary | ICD-10-CM

## 2022-08-30 DIAGNOSIS — M86341 Chronic multifocal osteomyelitis, right hand: Secondary | ICD-10-CM | POA: Diagnosis present

## 2022-08-30 DIAGNOSIS — J189 Pneumonia, unspecified organism: Secondary | ICD-10-CM | POA: Diagnosis not present

## 2022-08-30 DIAGNOSIS — E1122 Type 2 diabetes mellitus with diabetic chronic kidney disease: Secondary | ICD-10-CM | POA: Diagnosis not present

## 2022-08-30 DIAGNOSIS — R1314 Dysphagia, pharyngoesophageal phase: Secondary | ICD-10-CM | POA: Diagnosis not present

## 2022-08-30 DIAGNOSIS — E669 Obesity, unspecified: Secondary | ICD-10-CM | POA: Diagnosis present

## 2022-08-30 DIAGNOSIS — M86042 Acute hematogenous osteomyelitis, left hand: Secondary | ICD-10-CM | POA: Diagnosis not present

## 2022-08-30 DIAGNOSIS — E11622 Type 2 diabetes mellitus with other skin ulcer: Secondary | ICD-10-CM | POA: Diagnosis present

## 2022-08-30 DIAGNOSIS — Z87891 Personal history of nicotine dependence: Secondary | ICD-10-CM | POA: Diagnosis not present

## 2022-08-30 DIAGNOSIS — I4821 Permanent atrial fibrillation: Secondary | ICD-10-CM | POA: Diagnosis present

## 2022-08-30 DIAGNOSIS — I4891 Unspecified atrial fibrillation: Secondary | ICD-10-CM | POA: Diagnosis not present

## 2022-08-30 DIAGNOSIS — E11311 Type 2 diabetes mellitus with unspecified diabetic retinopathy with macular edema: Secondary | ICD-10-CM | POA: Diagnosis present

## 2022-08-30 HISTORY — PX: I & D EXTREMITY: SHX5045

## 2022-08-30 LAB — CBC
HCT: 33.4 % — ABNORMAL LOW (ref 39.0–52.0)
Hemoglobin: 10.2 g/dL — ABNORMAL LOW (ref 13.0–17.0)
MCH: 29.2 pg (ref 26.0–34.0)
MCHC: 30.5 g/dL (ref 30.0–36.0)
MCV: 95.7 fL (ref 80.0–100.0)
Platelets: 211 10*3/uL (ref 150–400)
RBC: 3.49 MIL/uL — ABNORMAL LOW (ref 4.22–5.81)
RDW: 19.6 % — ABNORMAL HIGH (ref 11.5–15.5)
WBC: 12.6 10*3/uL — ABNORMAL HIGH (ref 4.0–10.5)
nRBC: 0 % (ref 0.0–0.2)

## 2022-08-30 LAB — BASIC METABOLIC PANEL
Anion gap: 16 — ABNORMAL HIGH (ref 5–15)
BUN: 42 mg/dL — ABNORMAL HIGH (ref 8–23)
CO2: 27 mmol/L (ref 22–32)
Calcium: 8.7 mg/dL — ABNORMAL LOW (ref 8.9–10.3)
Chloride: 90 mmol/L — ABNORMAL LOW (ref 98–111)
Creatinine, Ser: 4.67 mg/dL — ABNORMAL HIGH (ref 0.61–1.24)
GFR, Estimated: 12 mL/min — ABNORMAL LOW (ref >60)
Glucose, Bld: 74 mg/dL (ref 70–99)
Potassium: 4.3 mmol/L (ref 3.5–5.1)
Sodium: 133 mmol/L — ABNORMAL LOW (ref 135–145)

## 2022-08-30 LAB — PHOSPHORUS: Phosphorus: 4.9 mg/dL — ABNORMAL HIGH (ref 2.5–4.6)

## 2022-08-30 LAB — GLUCOSE, CAPILLARY
Glucose-Capillary: 92 mg/dL (ref 70–99)
Glucose-Capillary: 101 mg/dL — ABNORMAL HIGH (ref 70–99)
Glucose-Capillary: 83 mg/dL (ref 70–99)
Glucose-Capillary: 117 mg/dL — ABNORMAL HIGH (ref 70–99)
Glucose-Capillary: 189 mg/dL — ABNORMAL HIGH (ref 70–99)

## 2022-08-30 LAB — MRSA NEXT GEN BY PCR, NASAL: MRSA by PCR Next Gen: DETECTED — AB

## 2022-08-30 SURGERY — IRRIGATION AND DEBRIDEMENT EXTREMITY
Anesthesia: General | Site: Thumb | Laterality: Left

## 2022-08-30 MED ORDER — LIDOCAINE 2% (20 MG/ML) 5 ML SYRINGE
INTRAMUSCULAR | Status: DC | PRN
  Administered 2022-08-30: 60 mg via INTRAVENOUS

## 2022-08-30 MED ORDER — FENTANYL CITRATE (PF) 250 MCG/5ML IJ SOLN
INTRAMUSCULAR | Status: DC | PRN
  Administered 2022-08-30: 50 ug via INTRAVENOUS

## 2022-08-30 MED ORDER — CHLORHEXIDINE GLUCONATE CLOTH 2 % EX PADS: 6.0000 | MEDICATED_PAD | Freq: Every day | CUTANEOUS | Status: DC

## 2022-08-30 MED ORDER — ORAL CARE MOUTH RINSE: 15.0000 mL | Freq: Once | OROMUCOSAL | Status: AC

## 2022-08-30 MED ORDER — PHENYLEPHRINE 80 MCG/ML (10ML) SYRINGE FOR IV PUSH (FOR BLOOD PRESSURE SUPPORT)
PREFILLED_SYRINGE | INTRAVENOUS | Status: DC | PRN
  Administered 2022-08-30: 240 ug via INTRAVENOUS

## 2022-08-30 MED ORDER — SODIUM CHLORIDE 0.9 % IV SOLN
2.0000 g | INTRAVENOUS | Status: DC
  Administered 2022-08-30 – 2022-08-31 (×2): 2 g via INTRAVENOUS
  Filled 2022-08-30 (×2): qty 20

## 2022-08-30 MED ORDER — HEPARIN SODIUM (PORCINE) 1000 UNIT/ML IJ SOLN
INTRAMUSCULAR | Status: AC
  Administered 2022-08-30: 1000 [IU]
  Filled 2022-08-30: qty 4

## 2022-08-30 MED ORDER — SODIUM CHLORIDE 0.9 % IV SOLN: INTRAVENOUS | Status: DC

## 2022-08-30 MED ORDER — SODIUM CHLORIDE 0.9 % IV SOLN
6.0000 mg/kg | Freq: Once | INTRAVENOUS | Status: AC
  Administered 2022-08-30: 650 mg via INTRAVENOUS
  Filled 2022-08-30: qty 13

## 2022-08-30 MED ORDER — FENTANYL CITRATE (PF) 250 MCG/5ML IJ SOLN
INTRAMUSCULAR | Status: AC
  Filled 2022-08-30: qty 5

## 2022-08-30 MED ORDER — SODIUM CHLORIDE 0.9 % IR SOLN
Status: DC | PRN
  Administered 2022-08-30: 3000 mL

## 2022-08-30 MED ORDER — PROPOFOL 10 MG/ML IV BOLUS
INTRAVENOUS | Status: DC | PRN
  Administered 2022-08-30: 20 mg via INTRAVENOUS

## 2022-08-30 MED ORDER — DEXAMETHASONE SODIUM PHOSPHATE 10 MG/ML IJ SOLN
INTRAMUSCULAR | Status: AC
  Filled 2022-08-30: qty 1

## 2022-08-30 MED ORDER — HYDROMORPHONE HCL 1 MG/ML IJ SOLN
0.5000 mg | INTRAMUSCULAR | Status: DC | PRN
  Administered 2022-08-30 – 2022-09-13 (×39): 0.5 mg via INTRAVENOUS
  Filled 2022-08-30 (×41): qty 0.5

## 2022-08-30 MED ORDER — CHLORHEXIDINE GLUCONATE 0.12 % MT SOLN: 15.0000 mL | Freq: Once | OROMUCOSAL | Status: AC

## 2022-08-30 MED ORDER — CHLORHEXIDINE GLUCONATE 0.12 % MT SOLN
OROMUCOSAL | Status: AC
  Administered 2022-08-30: 15 mL via OROMUCOSAL
  Filled 2022-08-30: qty 15

## 2022-08-30 MED ORDER — SODIUM CHLORIDE 0.9 % IV SOLN
6.0000 mg/kg | INTRAVENOUS | Status: DC
  Filled 2022-08-30: qty 13

## 2022-08-30 MED ORDER — DEXAMETHASONE SODIUM PHOSPHATE 10 MG/ML IJ SOLN
INTRAMUSCULAR | Status: DC | PRN
  Administered 2022-08-30: 5 mg via INTRAVENOUS

## 2022-08-30 MED ORDER — ONDANSETRON HCL 4 MG/2ML IJ SOLN
INTRAMUSCULAR | Status: DC | PRN
  Administered 2022-08-30: 4 mg via INTRAVENOUS

## 2022-08-30 MED ORDER — ONDANSETRON HCL 4 MG/2ML IJ SOLN
INTRAMUSCULAR | Status: AC
  Filled 2022-08-30: qty 2

## 2022-08-30 MED ORDER — MUPIROCIN 2 % EX OINT
1.0000 | TOPICAL_OINTMENT | Freq: Two times a day (BID) | CUTANEOUS | Status: AC
  Administered 2022-08-30 – 2022-09-03 (×7): 1 via NASAL
  Filled 2022-08-30: qty 22

## 2022-08-30 MED ORDER — ONDANSETRON HCL 4 MG/2ML IJ SOLN
4.0000 mg | Freq: Four times a day (QID) | INTRAMUSCULAR | Status: DC | PRN
  Administered 2022-08-30 – 2022-09-04 (×3): 4 mg via INTRAVENOUS
  Filled 2022-08-30 (×3): qty 2

## 2022-08-30 MED ORDER — PROPOFOL 10 MG/ML IV BOLUS
INTRAVENOUS | Status: AC
  Filled 2022-08-30: qty 20

## 2022-08-30 MED ORDER — BUPIVACAINE HCL (PF) 0.25 % IJ SOLN
INTRAMUSCULAR | Status: AC
  Filled 2022-08-30: qty 30

## 2022-08-30 MED ORDER — LIDOCAINE 2% (20 MG/ML) 5 ML SYRINGE
INTRAMUSCULAR | Status: AC
  Filled 2022-08-30: qty 5

## 2022-08-30 SURGICAL SUPPLY — 62 items
BAG COUNTER SPONGE SURGICOUNT (BAG) ×2 IMPLANT
BAG SPNG CNTER NS LX DISP (BAG) ×1
BNDG CMPR 9X4 STRL LF SNTH (GAUZE/BANDAGES/DRESSINGS) ×1
BNDG CMPR MD 5X2 ELC HKLP STRL (GAUZE/BANDAGES/DRESSINGS) ×1
BNDG COHESIVE 1X5 TAN STRL LF (GAUZE/BANDAGES/DRESSINGS) IMPLANT
BNDG CONFORM 2 STRL LF (GAUZE/BANDAGES/DRESSINGS) IMPLANT
BNDG ELASTIC 2 VLCR STRL LF (GAUZE/BANDAGES/DRESSINGS) IMPLANT
BNDG ELASTIC 3X5.8 VLCR STR LF (GAUZE/BANDAGES/DRESSINGS) ×2 IMPLANT
BNDG ELASTIC 4X5.8 VLCR STR LF (GAUZE/BANDAGES/DRESSINGS) ×2 IMPLANT
BNDG ESMARK 4X9 LF (GAUZE/BANDAGES/DRESSINGS) ×2 IMPLANT
BNDG GAUZE DERMACEA FLUFF 4 (GAUZE/BANDAGES/DRESSINGS) ×2 IMPLANT
BNDG GZE DERMACEA 4 6PLY (GAUZE/BANDAGES/DRESSINGS) ×1
CORD BIPOLAR FORCEPS 12FT (ELECTRODE) ×2 IMPLANT
COVER SURGICAL LIGHT HANDLE (MISCELLANEOUS) ×2 IMPLANT
CUFF TOURN SGL QUICK 18X4 (TOURNIQUET CUFF) ×2 IMPLANT
CUFF TOURN SGL QUICK 24 (TOURNIQUET CUFF)
CUFF TRNQT CYL 24X4X16.5-23 (TOURNIQUET CUFF) IMPLANT
DRAIN PENROSE 1/4X12 LTX STRL (WOUND CARE) IMPLANT
DRAPE SURG 17X23 STRL (DRAPES) ×2 IMPLANT
DRSG ADAPTIC 3X8 NADH LF (GAUZE/BANDAGES/DRESSINGS) ×2 IMPLANT
DRSG EMULSION OIL 3X3 NADH (GAUZE/BANDAGES/DRESSINGS) IMPLANT
ELECT REM PT RETURN 9FT ADLT (ELECTROSURGICAL)
ELECTRODE REM PT RTRN 9FT ADLT (ELECTROSURGICAL) IMPLANT
GAUZE SPONGE 4X4 12PLY STRL (GAUZE/BANDAGES/DRESSINGS) ×2 IMPLANT
GAUZE XEROFORM 1X8 LF (GAUZE/BANDAGES/DRESSINGS) ×2 IMPLANT
GAUZE XEROFORM 5X9 LF (GAUZE/BANDAGES/DRESSINGS) IMPLANT
GLOVE BIOGEL PI IND STRL 8.5 (GLOVE) ×2 IMPLANT
GLOVE SURG ORTHO 8.0 STRL STRW (GLOVE) ×2 IMPLANT
GOWN STRL REUS W/ TWL LRG LVL3 (GOWN DISPOSABLE) ×6 IMPLANT
GOWN STRL REUS W/ TWL XL LVL3 (GOWN DISPOSABLE) ×2 IMPLANT
GOWN STRL REUS W/TWL LRG LVL3 (GOWN DISPOSABLE) ×3
GOWN STRL REUS W/TWL XL LVL3 (GOWN DISPOSABLE) ×1
HANDPIECE INTERPULSE COAX TIP (DISPOSABLE)
KIT BASIN OR (CUSTOM PROCEDURE TRAY) ×2 IMPLANT
KIT TURNOVER KIT B (KITS) ×2 IMPLANT
MANIFOLD NEPTUNE II (INSTRUMENTS) ×2 IMPLANT
NDL HYPO 25GX1X1/2 BEV (NEEDLE) IMPLANT
NEEDLE HYPO 25GX1X1/2 BEV (NEEDLE) IMPLANT
NS IRRIG 1000ML POUR BTL (IV SOLUTION) ×2 IMPLANT
PACK ORTHO EXTREMITY (CUSTOM PROCEDURE TRAY) ×2 IMPLANT
PAD ARMBOARD 7.5X6 YLW CONV (MISCELLANEOUS) ×4 IMPLANT
PAD CAST 4YDX4 CTTN HI CHSV (CAST SUPPLIES) ×2 IMPLANT
PADDING CAST COTTON 4X4 STRL (CAST SUPPLIES) ×1
SET CYSTO W/LG BORE CLAMP LF (SET/KITS/TRAYS/PACK) IMPLANT
SET HNDPC FAN SPRY TIP SCT (DISPOSABLE) IMPLANT
SOAP 2 % CHG 4 OZ (WOUND CARE) ×2 IMPLANT
SPONGE T-LAP 18X18 ~~LOC~~+RFID (SPONGE) ×2 IMPLANT
SPONGE T-LAP 4X18 ~~LOC~~+RFID (SPONGE) ×2 IMPLANT
STOCKINETTE TUBULAR COTTON 2IN (GAUZE/BANDAGES/DRESSINGS) IMPLANT
SUT ETHILON 4 0 PS 2 18 (SUTURE) IMPLANT
SUT ETHILON 5 0 P 3 18 (SUTURE)
SUT NYLON ETHILON 5-0 P-3 1X18 (SUTURE) IMPLANT
SUT PROLENE 4 0 PS 2 18 (SUTURE) IMPLANT
SWAB COLLECTION DEVICE MRSA (MISCELLANEOUS) ×2 IMPLANT
SWAB CULTURE ESWAB REG 1ML (MISCELLANEOUS) IMPLANT
SYR CONTROL 10ML LL (SYRINGE) IMPLANT
TOWEL GREEN STERILE (TOWEL DISPOSABLE) ×2 IMPLANT
TOWEL GREEN STERILE FF (TOWEL DISPOSABLE) ×2 IMPLANT
TUBE CONNECTING 12X1/4 (SUCTIONS) ×2 IMPLANT
UNDERPAD 30X36 HEAVY ABSORB (UNDERPADS AND DIAPERS) ×2 IMPLANT
WATER STERILE IRR 1000ML POUR (IV SOLUTION) ×2 IMPLANT
YANKAUER SUCT BULB TIP NO VENT (SUCTIONS) ×2 IMPLANT

## 2022-08-30 NOTE — Op Note (Signed)
PREOPERATIVE DIAGNOSIS: Left thumb infection with likely osteomyelitis of the thumb flexor sheath  POSTOPERATIVE DIAGNOSIS: Same  ATTENDING SURGEON: Dr. Iran Planas who scrubbed and present for the entire procedure versus  ASSISTANT SURGEON: Gertie Fey, PA-C was present for the entire procedure closure.  Necessary for exposure irrigation debridement and application of a dressing at the time instructions  ANESTHESIA: General via LMA  OPERATIVE PROCEDURE: Left thumb flexor sheath incision and drainage Bone curettage and debridement of osteomyelitis distal phalanx  IMPLANTS: None  EBL: None  RADIOGRAPHIC INTERPRETATION: None  SURGICAL INDICATIONS: Patient is a right-hand-dominant gentleman with end-stage kidney disease and diabetes presenting with a worsening infection of his left thumb.  Patient seen and evaluated and recommended undergo the above procedure.  Risks of surgery include bleeding infection damage nearby nerves arteries or tendons loss of motion of the wrist and digits incomplete relief of symptoms and need for further surgical invention.  Signed informed consent was obtained.  Case  SURGICAL TECHNIQUE: The patient was prepped identified in the preoperative holding area marked with the permanent marker made on the left thumb.  Patient brought back to operating placed supine on the anesthesia table where the general anesthetic was administered.  Patient tolerates well.  Well-padded tourniquet placed on the left forearm and sealed with the appropriate drape.  Left upper extremities were prepped and draped normal sterile fashion.  A timeout was called the correct site identified procedure then begun.  Attention then turned to the left thumb open incision was made directly over the dorsal aspect of the thumb and volar aspect of the thumb opening the flexor sheath and extensor mechanism.  The patient did have a gross purulence.  Wound cultures were then taken.  After thorough  debridement of the devitalized tissues attention was then turned to bony debridement.  Bony debridement and curettage was then carried out the osteomyelitis of the distal phalanx.  A portion of bone was sent for wound culture.  Wound was then thoroughly irrigated.  Skin was then loosely reapproximated and closed using simple Prolene sutures.  Adaptic dressing sterile compressive bandage applied.  Patient tolerated procedure well extubated taken recovery in good condition.  POSTOPERATIVE PLAN: The patient does have a significant effect of the distal phalanx of the thumb.  I think the patient is going to require further treatment.  Likely amputation to the level of the interphalangeal joint.  The infection did not appear to extend past the IP joint.  Plan for repeat I&D and likely amputation to the level of the IP joint on 09/04/2022.  Continue with the inpatient IV antibiotics and appropriate care.  Continue to follow as an outpatient

## 2022-08-30 NOTE — Progress Notes (Signed)
[facility] Progress Note  Subjective: pt went for AVF ligation yesterday w/ TDC placement. Had HD w/ 3L off. No new c/o's today.   Vitals:   08/30/22 0230 08/30/22 0252 08/30/22 0307 08/30/22 0457  BP: 122/72 116/74 118/82 108/60  Pulse: 97 96 92 95  Resp: (!) 26 (!) 23 (!) 22 18  Temp:  99 F (37.2 C) 99 F (37.2 C) 98 F (36.7 C)  TempSrc:  Axillary Axillary   SpO2: 93% 96%  94%  Weight:   105.7 kg   Height:        Exam: Gen alert, chronically ill appearing, on RA No jvd or bruits Chest clear bilat to bases RRR no MRG Abd soft ntnd no mass or ascites +bs Ext diffuse 2-3+ pitting edema bilat LE's from feet to hips Neuro is alert, Ox 3 , nf    LUA AVF+bruit    Home meds include - eliquis, furosemide 40 qd, isordil 20, midodrine 10 prn, novolin insulin, rosuvastatin, vits/ supps / prns    OP HD: TTS SW 4h 450/ 500  101 kg  3K/2.5 bath  Hep none LUA AVF - last HD post HD 101.6kg on 10/7 - last Hb 10.0 10/5 - mircera 50 q 2, last 9/26 - venofer 100 mg tiw IV thru 10/19 - doxercalciferol 5 ug ttw IV     BP 120 70, HR 75 afib  RR 15-22 ,  afeb 100% on RA    K 4.7  CO2 27  BUN 68  Cr 6.7  alb 2.4  WBC 16k  Hb 10.4   Assessment/ Plan: Acute osteomyelitis L hand - thumb mostly, started on IV zosyn / zyvox (vanc allergy). VVS ligated pt's L arm AVF to improve circulation.  ESRD - on HD TTS. HD tomorrow.  HD access - new L IJ TDC, L AVF ligated 10/10.  DM2 - on insulin BP/ volume overload - BP's okay but patient has marked vol overload w/ diffuse LE edema. No resp issues, on RA. Get CXR. Was just started on pre HD midodrine '10mg'$  tts, continue while here. May need serial HD.  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 Hold IV Fe hold given infection.  MBD ckd - CCa in range, add on phos. Cont IV vdra.      Rob Andre Swander 08/30/2022, 10:18 AM   Recent Labs  Lab 08/28/22 2323 08/29/22 1420 08/30/22 0817  HGB 10.4* 11.9* 10.2*   ALBUMIN 2.4*  --   --   CALCIUM 8.6*  --  8.7*  CREATININE 6.76* 7.40* 4.67*  K 4.7 5.0 4.3   No results for input(s): "IRON", "TIBC", "FERRITIN" in the last 168 hours. Inpatient medications:  Chlorhexidine Gluconate Cloth  6 each Topical Q0600   insulin aspart  0-24 Units Subcutaneous TID WC   insulin aspart  4 Units Subcutaneous TID WC   insulin detemir  20 Units Subcutaneous Daily   [START ON 08/31/2022] midodrine  10 mg Oral Q T,Th,Sa-HD   mupirocin ointment  1 Application Nasal BID   rosuvastatin  40 mg Oral Daily    cefTRIAXone (ROCEPHIN)  IV     linezolid (ZYVOX) IV 600 mg (08/30/22 0935)   acetaminophen **OR** acetaminophen, HYDROmorphone (DILAUDID) injection, ondansetron (ZOFRAN) IV, traMADol

## 2022-08-30 NOTE — Progress Notes (Signed)
Pt receives out-pt HD at [facility] SW on TTS. Pt arrives at 6:35 am for 6:55 am chair time. Will assist as needed.   Melven Sartorius Renal Navigator [phone]

## 2022-08-30 NOTE — Anesthesia Postprocedure Evaluation (Signed)
Anesthesia Post Note  Patient: [name]  Procedure(s) Performed: IRRIGATION AND DEBRIDEMENT LEFT THUMB (Left: Thumb)     Patient location during evaluation: PACU Anesthesia Type: General Level of consciousness: awake and alert Pain management: pain level controlled Vital Signs Assessment: post-procedure vital signs reviewed and stable Respiratory status: spontaneous breathing, nonlabored ventilation, respiratory function stable and patient connected to nasal cannula oxygen Cardiovascular status: blood pressure returned to baseline and stable Postop Assessment: no apparent nausea or vomiting Anesthetic complications: no  No notable events documented.  Last Vitals:  Vitals:   08/30/22 1430 08/30/22 1454  BP: 116/71 119/78  Pulse: 72 95  Resp: 10 17  Temp: 36.5 C 36.7 C  SpO2: 100% 96%    Last Pain:  Vitals:   08/30/22 1454  TempSrc: Oral  PainSc:                  Sophiah Rolin,W. EDMOND

## 2022-08-30 NOTE — Progress Notes (Signed)
  Progress Note    08/30/2022 8:17 AM 1 Day Post-Op  Subjective: Left arm and left side of neck feeling sore    Vitals:   08/30/22 0307 08/30/22 0457  BP: 118/82 108/60  Pulse: 92 95  Resp: (!) 22 18  Temp: 99 F (37.2 C) 98 F (36.7 C)  SpO2:  94%    Physical Exam: Lungs:  nonlabored Incisions:  L arm incision intact with some bruising, no hematoma. L TDC incision intact with bruising. Extremities:  Brisk L radial doppler signal. L arm and hand edema. Intact motor and sensation to L arm and hand. Ulcers of L fingers unchanged   CBC    Component Value Date/Time   WBC 16.3 (H) 08/28/2022 2323   RBC 3.48 (L) 08/28/2022 2323   HGB 11.9 (L) 08/29/2022 1420   HCT 35.0 (L) 08/29/2022 1420   PLT 215 08/28/2022 2323   MCV 95.7 08/28/2022 2323   MCV 101 09/09/2020 0000   MCH 29.9 08/28/2022 2323   MCHC 31.2 08/28/2022 2323   RDW 19.7 (H) 08/28/2022 2323   RDW 16.1 09/09/2020 0000   LYMPHSABS 0.5 (L) 08/28/2022 2323   MONOABS 1.3 (H) 08/28/2022 2323   EOSABS 0.1 08/28/2022 2323   BASOSABS 0.0 08/28/2022 2323    BMET    Component Value Date/Time   NA 127 (L) 08/29/2022 1420   NA 146 09/09/2020 0000   K 5.0 08/29/2022 1420   K 4.5 07/11/2012 2135   CL 90 (L) 08/29/2022 1420   CL 95 11/27/2017 0000   CO2 27 08/28/2022 2323   CO2 26 11/27/2017 0000   GLUCOSE 102 (H) 08/29/2022 1420   BUN 74 (H) 08/29/2022 1420   BUN 105 (A) 11/27/2017 0000   CREATININE 7.40 (H) 08/29/2022 1420   CREATININE 2.37 (H) 09/03/2018 0900   CALCIUM 8.6 (L) 08/28/2022 2323   CALCIUM 10.0 11/27/2017 0000   CALCIUM 9.1 05/28/2008 0000   GFRNONAA 8 (L) 08/28/2022 2323   GFRNONAA 24 (L) 05/07/2018 1410   GFRAA 18 (L) 10/27/2019 1211   GFRAA 28 (L) 05/07/2018 1410    INR    Component Value Date/Time   INR 2.5 06/27/2021 1116   INR 1.5 (H) 10/27/2020 0644     Intake/Output Summary (Last 24 hours) at 08/30/2022 0817 Last data filed at 08/30/2022 0307 Gross per 24 hour  Intake --   Output 3010 ml  Net -3010 ml     Assessment/Plan:  76 y.o. male is 1 day post op, s/p: L upper arm AV fistula ligation, placement of left IJ TDC   -L arm and neck incisions intact with some bruising. No evidence of hematoma or bleeding. -L finger ulcers unchanged -Brisk L radial doppler signal -LUE swelling, likely due to restored blood flow in arm after fistula ligation. Patient can elevate arm to reduce swelling -Intact motor and sensation of LUE  Vicente Serene, PA-C [facility] [phone] 08/30/2022 8:17 AM

## 2022-08-30 NOTE — Anesthesia Preprocedure Evaluation (Signed)
Anesthesia Evaluation  Patient identified by MRN, date of birth, ID band Patient awake    Reviewed: Allergy & Precautions, H&P , NPO status , Patient's Chart, lab work & pertinent test results  Airway Mallampati: III  TM Distance: >3 FB Neck ROM: Full    Dental no notable dental hx. (+) Teeth Intact, Dental Advisory Given   Pulmonary sleep apnea and Continuous Positive Airway Pressure Ventilation , former smoker   Pulmonary exam normal breath sounds clear to auscultation       Cardiovascular hypertension, + CAD and +CHF  + dysrhythmias Atrial Fibrillation  Rhythm:Regular Rate:Normal     Neuro/Psych negative neurological ROS  negative psych ROS   GI/Hepatic negative GI ROS, Neg liver ROS,,,  Endo/Other  diabetes, Insulin Dependent  Morbid obesity  Renal/GU ESRF and DialysisRenal disease  negative genitourinary   Musculoskeletal  (+) Arthritis , Osteoarthritis,    Abdominal   Peds  Hematology  (+) Blood dyscrasia, anemia   Anesthesia Other Findings   Reproductive/Obstetrics negative OB ROS                             Anesthesia Physical Anesthesia Plan  ASA: 3  Anesthesia Plan: General   Post-op Pain Management: Tylenol PO (pre-op)*   Induction: Intravenous  PONV Risk Score and Plan: 3 and Ondansetron, Dexamethasone and Treatment may vary due to age or medical condition  Airway Management Planned: LMA  Additional Equipment:   Intra-op Plan:   Post-operative Plan: Extubation in OR  Informed Consent: I have reviewed the patients History and Physical, chart, labs and discussed the procedure including the risks, benefits and alternatives for the proposed anesthesia with the patient or authorized representative who has indicated his/her understanding and acceptance.     Dental advisory given  Plan Discussed with: CRNA  Anesthesia Plan Comments:        Anesthesia Quick  Evaluation

## 2022-08-30 NOTE — Transfer of Care (Signed)
Immediate Anesthesia Transfer of Care Note  Patient: [name]  Procedure(s) Performed: IRRIGATION AND DEBRIDEMENT LEFT THUMB (Left: Thumb)  Patient Location: PACU  Anesthesia Type:General  Level of Consciousness: drowsy and patient cooperative  Airway & Oxygen Therapy: Patient Spontanous Breathing and Patient connected to nasal cannula oxygen  Post-op Assessment: Report given to RN and Post -op Vital signs reviewed and stable  Post vital signs: Reviewed and stable  Last Vitals:  Vitals Value Taken Time  BP 107/79 08/30/22 1408  Temp    Pulse 79 08/30/22 1409  Resp 8 08/30/22 1409  SpO2 100 % 08/30/22 1409  Vitals shown include unvalidated device data.  Last Pain:  Vitals:   08/30/22 1111  TempSrc: Oral  PainSc: 5          Complications: No notable events documented.

## 2022-08-30 NOTE — Progress Notes (Addendum)
Pharmacy Antibiotic Note  Louis [name] is a 76 y.o. male  with osteomyelitis.   Pharmacy has been consulted for daptomycin dosing. He is noted with an allergy to vancomycin. He also has ESRD and is on HD TTS  Plan: -Daptomycin '6mg'$  IV now then '6mg'$  after HD on Thursday then will consider increasing dose by 50% on the next HD ('6mg'$ /'6mg'$ /'9mg'$ ) -CK every Thursday  Height: '5\' 7"'$  (170.2 cm) Weight: 105.7 kg (233 lb 0.4 oz) IBW/kg (Calculated) : 66.1  Temp (24hrs), Avg:98.2 F (36.8 C), Min:97.6 F (36.4 C), Max:99 F (37.2 C)  Recent Labs  Lab 08/28/22 1630 08/28/22 2323 08/29/22 0539 08/29/22 1420 08/30/22 0817  WBC 16.5* 16.3*  --   --  12.6*  CREATININE  --  6.76*  --  7.40* 4.67*  LATICACIDVEN  --  1.5 2.0*  --   --     Estimated Creatinine Clearance: 15.6 mL/min (A) (by C-G formula based on SCr of 4.67 mg/dL (H)).    Allergies  Allergen Reactions   Hydrocodone Nausea And Vomiting    Other reaction(s): GI Upset (intolerance) Projectile vomiting    Oxycodone Nausea And Vomiting    Other reaction(s): GI Upset (intolerance), Vomiting (intolerance) Projectile vomiting    Vancomycin Anaphylaxis    ORAL VANCOMYCIN for C diff.   Dacarbazine Other (See Comments)    Unknown reaction   Latex Itching and Rash   Tape Dermatitis, Itching and Rash    Patch used at dialysis    Antimicrobials this admission: Linezolid 10/10>> 10/11 Zosyn 10/10 >> 10/11 Daptomycin 10/11 Rocephin 10/11>>  Dose adjustments this admission:  Microbiology results: 10/10 blood x2 10/11 wound cx  Thank you for allowing pharmacy to be a part of this patient's care.  Hildred Laser, PharmD Clinical Pharmacist **Pharmacist phone directory can now be found on [email or web address] ([email or web address]).  Listed under [place].

## 2022-08-30 NOTE — Progress Notes (Addendum)
PROGRESS NOTE    Louis [name]  HEN:[id number] DOB: 02/26/1946 DOA: 08/28/2022 PCP: [name], MD   Brief Narrative:  HPI: Louis [name] is a 75 y.o. with history of ESRD on hemodialysis, type 2 diabetes, CHF, CAD, hyperlipidemia, hypertension presented to the ER at the recommendation of his failure medicine physician.  Patient had left hand swelling abdominal swelling for the last 2 days.  He has noticed bloody drainage starting yesterday so he presented to the ER.  He has had wounds on his hands for a year however never to this severity.  In the ER he had x-rays obtained which demonstrated extensive soft tissue swelling of the left thumb concern for osteomyelitis.  Other labs were obtained which showed CBC WBC 16.5, hemoglobin 10.7, sodium 131, potassium 4.7, creatinine 6.7, lactic acid 1.5, magnesium 3.  Hand surgery was consulted in the ED the patient was admitted for IV antibiotics.  He is allergic to vancomycin so he was given linezolid. 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  Patient underwent left upper arm AV fistula ligation and placement of left IJ TDC by vascular surgery on 08/29/2022.  Patient remains on Zosyn and linezolid per ID.  Further management by ID, vascular as well as orthopedics.  ESRD on HD: Nephrology on board.  Hyperlipidemia: Continue Crestor.  Type 2 diabetes mellitus: Blood sugar fairly controlled.  Continue Lantus 20 units and SSI.  DVT prophylaxis: SCDs Start: 08/29/22 0549   Code Status: Full Code  Family Communication: Wife present at bedside.  Plan of care discussed with patient in length and  he/she verbalized understanding and agreed with it.  Status is: Observation The patient will require care spanning > 2 midnights and should be moved to inpatient because: Likely going to need surgical procedure and hospital stay for couple of days.   Estimated body mass index is 36.5 kg/m as calculated from the following:   Height as of this encounter: '5\' 7"'$  (1.702 m).   Weight as of this encounter: 105.7 kg.    Nutritional Assessment: Body mass index is 36.5 kg/m.Marland Kitchen Seen by dietician.  I agree with the assessment and plan as outlined below: Nutrition Status:        . Skin Assessment: I have examined the patient's skin and I agree with the wound assessment as performed by the wound care RN as outlined below:    Consultants:  Ortho  ID Vascular surgery Nephrology  Procedures:  As above  Antimicrobials:  Anti-infectives (From admission, onward)    Start     Dose/Rate Route Frequency Ordered Stop   08/29/22 1500  ceFAZolin (ANCEF) IVPB 2g/100 mL premix  Status:  Discontinued        2 g 200 mL/hr over 30 Minutes Intravenous On call to O.R. 08/29/22 1454 08/29/22 1705   08/29/22 1430  ceFAZolin (ANCEF) IVPB 2g/100 mL premix        2 g 200 mL/hr over 30 Minutes Intravenous  Once 08/29/22 1418 08/29/22 1456   08/29/22 1417  ceFAZolin (ANCEF) 2-4 GM/100ML-% IVPB       Note to Pharmacy: Ellouise Newer: cabinet override  08/29/22 1417 08/29/22 1524   08/29/22 1400  piperacillin-tazobactam (ZOSYN) IVPB 2.25 g        2.25 g 100 mL/hr over 30 Minutes Intravenous Every 8 hours 08/29/22 0501     08/29/22 0500  linezolid (ZYVOX) IVPB 600 mg        600 mg 300 mL/hr over 60 Minutes Intravenous Every 12 hours 08/29/22 0458     08/29/22 0500  piperacillin-tazobactam (ZOSYN) IVPB 3.375 g        3.375 g 100 mL/hr over 30 Minutes Intravenous  Once 08/29/22 0459 08/29/22 0720         Subjective: Patient seen and examined with wife at the bedside.  Patient complains of  pain in bilateral hands/fingers.  No other complaint.  Wife tells me that patient's edema which is significant is chronic and going on for about 6 months.  Objective: Vitals:   08/30/22 0230 08/30/22 0252 08/30/22 0307 08/30/22 0457  BP: 122/72 116/74 118/82 108/60  Pulse: 97 96 92 95  Resp: (!) 26 (!) 23 (!) 22 18  Temp:  99 F (37.2 C) 99 F (37.2 C) 98 F (36.7 C)  TempSrc:  Axillary Axillary   SpO2: 93% 96%  94%  Weight:   105.7 kg   Height:        Intake/Output Summary (Last 24 hours) at 08/30/2022 0930 Last data filed at 08/30/2022 0307 Gross per 24 hour  Intake --  Output 3010 ml  Net -3010 ml   Filed Weights   08/29/22 1352 08/29/22 2250 08/30/22 0307  Weight: 102.1 kg 108.7 kg 105.7 kg    Examination:  General exam: Appears calm and comfortable  Respiratory system: Clear to auscultation. Respiratory effort normal. Cardiovascular system: S1 & S2 heard, RRR. No JVD, murmurs, rubs, gallops or clicks.  +3 pitting edema bilateral lower extremity. Gastrointestinal system: Abdomen is nondistended, soft and nontender. No organomegaly or masses felt. Normal bowel sounds heard. Central nervous system: Alert and oriented. No focal neurological deficits. Extremities: Symmetric 5 x 5 power. Skin: Ulcerated and necrotic multiple fingers in bilateral hands.  Most notably left thumb. Psychiatry: Judgement and insight appear normal. Mood & affect appropriate.    Data Reviewed: I have personally reviewed following labs and imaging studies  CBC: Recent Labs  Lab 08/28/22 1630 08/28/22 2323 08/29/22 1420  WBC 16.5* 16.3*  --   NEUTROABS 14,421* 14.4*  --   HGB 10.7* 10.4* 11.9*  HCT 33.9* 33.3* 35.0*  MCV 93.1 95.7  --   PLT 217 215  --    Basic Metabolic Panel: Recent Labs  Lab 08/28/22 2323 08/29/22 1420  NA 131* 127*  K 4.7 5.0  CL 90* 90*  CO2 27  --   GLUCOSE 137* 102*  BUN 68* 74*  CREATININE 6.76* 7.40*  CALCIUM 8.6*  --   MG 3.0*  --     GFR: Estimated Creatinine Clearance: 9.8 mL/min (A) (by C-G formula based on SCr of 7.4 mg/dL (H)). Liver Function Tests: Recent Labs  Lab 08/28/22 2323  AST 26  ALT 23  ALKPHOS 248*  BILITOT 1.0  PROT 6.5  ALBUMIN 2.4*   No results for input(s): "LIPASE", "AMYLASE" in the last 168 hours. No results for input(s): "AMMONIA" in the last 168 hours. Coagulation Profile: No results for input(s): "INR", "PROTIME" in the last 168 hours. Cardiac Enzymes: No results for input(s): "CKTOTAL", "CKMB", "CKMBINDEX", "TROPONINI" in the last 168 hours. BNP (last 3 results) No results for input(s): "PROBNP" in  the last 8760 hours. HbA1C: No results for input(s): "HGBA1C" in the last 72 hours. CBG: Recent Labs  Lab 08/29/22 1542 08/29/22 1635 08/29/22 1744 08/29/22 1949 08/30/22 0734  GLUCAP 122* 94 114* 168* 92   Lipid Profile: No results for input(s): "CHOL", "HDL", "LDLCALC", "TRIG", "CHOLHDL", "LDLDIRECT" in the last 72 hours. Thyroid Function Tests: No results for input(s): "TSH", "T4TOTAL", "FREET4", "T3FREE", "THYROIDAB" in the last 72 hours. Anemia Panel: No results for input(s): "VITAMINB12", "FOLATE", "FERRITIN", "TIBC", "IRON", "RETICCTPCT" in the last 72 hours. Sepsis Labs: Recent Labs  Lab 08/28/22 2323 08/29/22 0539  LATICACIDVEN 1.5 2.0*    Recent Results (from the past 240 hour(s))  Blood culture (routine x 2)     Status: None (Preliminary result)   Collection Time: 08/28/22 11:30 PM   Specimen: BLOOD RIGHT HAND  Result Value Ref Range Status   Specimen Description BLOOD RIGHT HAND  Final   Special Requests   Final    BOTTLES DRAWN AEROBIC AND ANAEROBIC Blood Culture adequate volume   Culture   Final    NO GROWTH < 24 HOURS Performed at [facility], [place] [address], [place], [place] [address]    Report Status PENDING  Incomplete  Blood culture (routine x 2)     Status: None (Preliminary result)   Collection Time: 08/29/22  5:40 AM   Specimen:  BLOOD RIGHT FOREARM  Result Value Ref Range Status   Specimen Description BLOOD RIGHT FOREARM  Final   Special Requests   Final    BOTTLES DRAWN AEROBIC AND ANAEROBIC Blood Culture results may not be optimal due to an inadequate volume of blood received in culture bottles   Culture   Final    NO GROWTH < 12 HOURS Performed at [facility], [place] [address] [place], [place] [address]    Report Status PENDING  Incomplete  MRSA Next Gen by PCR, Nasal     Status: Abnormal   Collection Time: 08/30/22  3:41 AM   Specimen: Nasal Mucosa; Nasal Swab  Result Value Ref Range Status   MRSA by PCR Next Gen DETECTED (A) NOT DETECTED Final    Comment: CRITICAL RESULT CALLED TO, READ BACK BY AND VERIFIED WITH:  C/ ERIC N., RN 08/30/22 0548 A. LAFRANCE (NOTE) The GeneXpert MRSA Assay (FDA approved for NASAL specimens only), is one component of a comprehensive MRSA colonization surveillance program. It is not intended to diagnose MRSA infection nor to guide or monitor treatment for MRSA infections. Test performance is not FDA approved in patients less than 38 years old. Performed at [facility], [place] [address] [place], [place] [address]      Radiology Studies: DG Chest Port 1 View  Result Date: 08/29/2022 CLINICAL DATA:  Placement of dialysis catheter, difficulty breathing EXAM: PORTABLE CHEST 1 VIEW COMPARISON:  10/27/2019 FINDINGS: There is interval placement of left IJ dialysis catheter with its tip in right atrium. Transverse diameter of heart is increased. There is increased density in left lower lung fields suggesting possible moderate sized left pleural effusion and underlying atelectasis/pneumonia. Central pulmonary vessels are prominent. Right lung is clear of any focal infiltrates. There is no pneumothorax. IMPRESSION: Tip of dialysis catheter is seen in the region of right atrium. There is no pneumothorax. Cardiomegaly. Moderate left pleural effusion. Possible  atelectasis/pneumonia in left mid and left lower lung fields. Central pulmonary vessels are prominent without signs of alveolar pulmonary edema. Electronically Signed   By: Prudy Feeler.D.  On: 08/29/2022 16:44   DG C-Arm 1-60 Min-No Report  Result Date: 08/29/2022 Fluoroscopy was utilized by the requesting physician.  No radiographic interpretation.   DG Hand Complete Right  Result Date: 08/29/2022 CLINICAL DATA:  Osteomyelitis EXAM: RIGHT HAND - COMPLETE 3+ VIEW COMPARISON:  None Available. FINDINGS: Advanced vascular calcifications are seen within the right hand. There is a superficial wound involving the distal aspect of the ring finger. There are erosive changes involving the subjacent distal tuft of the fourth distal phalanx suggesting osteomyelitis in this location. There are, additionally, probable chronic erosive changes involving the distal tuft of the distal phalanges of the third digit and thumb. This may reflect the sequela of remote or chronic osteomyelitis. Alternatively, vasculopathy such is Raynaud's phenomena or thermal injuries such as frostbite could result in a similar appearance. Superimposed mild degenerative changes are noted. No acute fracture or dislocation. IMPRESSION: Superficial wound involving the distal aspect of the right ring finger with associated erosive changes involving the subjacent distal tuft of the fourth distal phalanx suggesting osteomyelitis. Additional erosive changes involving the distal tuft of the distal phalanx of the thumb and long finger. See differential considerations above. Peripheral vascular disease. Electronically Signed   By: Fidela Salisbury M.D.   On: 08/29/2022 00:09   DG Finger Thumb Left  Result Date: 08/29/2022 CLINICAL DATA:  Left thumb pain, swelling, throbbing EXAM: LEFT THUMB 2+V COMPARISON:  None Available. FINDINGS: There is extensive soft tissue swelling of the left thumb with foci of subcutaneous gas noted. Superficial  ulcer noted involving the distal aspect of the left thumb. There is subjacent erosive changes involving the distal tuft of the distal phalanx in keeping with osteomyelitis in this location. Advanced vascular calcifications are noted. Degenerative changes are noted within the interphalangeal joint and first metacarpophalangeal joint of the thumb. IMPRESSION: Extensive soft tissue swelling of the left thumb with subcutaneous gas and subjacent erosive changes involving the distal tuft of the distal phalanx in keeping with osteomyelitis. Electronically Signed   By: Fidela Salisbury M.D.   On: 08/29/2022 00:04   DG Hand Complete Left  Result Date: 08/29/2022 CLINICAL DATA:  Osteomyelitis EXAM: LEFT HAND - COMPLETE 3+ VIEW COMPARISON:  None Available. FINDINGS: Advanced vascular calcifications are seen within the left hand. There is diffuse soft tissue swelling of the left hand, most severely involving the thumb, the inner eminence, and dorsum of the left hand. There is subcutaneous gas identified within the soft tissues of the thumb. There is a superficial wound involving the distal aspect of the thumb. There is subjacent erosive changes involving the distal tuft of the distal phalanx in keeping with osteomyelitis in this location. Erosive changes are noted involving the distal tuft of the index finger with surrounding soft tissue swelling. No acute fracture or dislocation. Remaining joint spaces appear preserved. IMPRESSION: 1. Superficial wound involving the distal aspect of the thumb with subjacent erosive changes involving the distal tuft of the distal phalanx in keeping with osteomyelitis. 2. Subcutaneous gas within the soft tissues of the thumb. 3. Erosive changes involving the distal tuft of the index finger with associated soft tissue swelling in keeping with probable osteomyelitis in this location as well. Electronically Signed   By: Fidela Salisbury M.D.   On: 08/29/2022 00:02    Scheduled Meds:   Chlorhexidine Gluconate Cloth  6 each Topical Q0600   insulin aspart  0-24 Units Subcutaneous TID WC   insulin aspart  4 Units Subcutaneous TID WC   insulin  detemir  20 Units Subcutaneous Daily   [START ON 08/31/2022] midodrine  10 mg Oral Q T,Th,Sa-HD   mupirocin ointment  1 Application Nasal BID   rosuvastatin  40 mg Oral Daily   Continuous Infusions:  linezolid (ZYVOX) IV 600 mg (08/29/22 2220)   piperacillin-tazobactam (ZOSYN)  IV 2.25 g (08/30/22 0348)     LOS: 0 days   Louis Cheney, MD [facility]  08/30/2022, 9:30 AM   *Please note that this is a verbal dictation therefore any spelling or grammatical errors are due to the "[place] [employer]" system interpretation.  Please page via [place] and do not message via secure chat for urgent patient care matters. Secure chat can be used for non urgent patient care matters.  How to contact the [facility] Attending or Consulting provider [place] or covering provider during after hours [place], for this patient?  Check the care team in [facility] and look for a) attending/consulting [facility] provider listed and b) the [facility] team listed. Page or secure chat [phone]. Log into [email or web address] and use [place]'s universal password to access. If you do not have the password, please contact the hospital operator. Locate the [facility] provider you are looking for under [facility] and page to a number that you can be directly reached. If you still have difficulty reaching the provider, please page the [facility] (Director on Call) for the Hospitalists listed on amion for assistance.

## 2022-08-30 NOTE — Progress Notes (Signed)
   08/30/22 0307  Vitals  Temp 99 F (37.2 C)  Temp Source Axillary  BP 118/82  Pulse Rate 92  Resp (!) 22  Post Treatment  Dialyzer Clearance Heavily streaked  Duration of HD Treatment -hour(s) 3.5 hour(s)  Liters Processed 84  Fluid Removed 3000 mL  Tolerated HD Treatment Yes   TX fin. W/o difficulty.

## 2022-08-30 NOTE — Progress Notes (Signed)
R/B/A DISCUSSED WITH PT IN HOSPITAL PT VOICED UNDERSTANDING OF PLAN CONSENT SIGNED DAY OF SURGERY PT SEEN AND EXAMINED PRIOR TO OPERATIVE PROCEDURE/DAY OF SURGERY SITE MARKED. QUESTIONS ANSWERED WILL REMAIN AN INPATIENT FOLLOWING SURGERY   PT WITH BAD INFECTION TO LEFT THUMB CARE DISCUSSED WITH WIFE WILL TRY TO MAKE EVERY EFFORT TO SAVE THUMB MAY REQUIRE SOME FORM OF AMPUTATION WILL PERFORM I/D TODAY AND LIKELY WILL NEED REPEAT I/D POSSIBLE STAGED AMPUTATION THROUGH IP JOINT

## 2022-08-30 NOTE — Anesthesia Procedure Notes (Signed)
Procedure Name: LMA Insertion Date/Time: 08/30/2022 1:31 PM  Performed by: Moshe Salisbury, CRNAPre-anesthesia Checklist: Patient identified, Emergency Drugs available, Suction available and Patient being monitored Patient Re-evaluated:Patient Re-evaluated prior to induction Oxygen Delivery Method: Circle System Utilized Preoxygenation: Pre-oxygenation with 100% oxygen Induction Type: IV induction Ventilation: Mask ventilation without difficulty LMA: LMA inserted LMA Size: 5.0 Number of attempts: 1 Placement Confirmation: positive ETCO2 Tube secured with: Tape Dental Injury: Teeth and Oropharynx as per pre-operative assessment

## 2022-08-30 NOTE — Consult Note (Signed)
[facility] Infectious [facility]    Date of Admission:  08/28/2022     Total days of antibiotics 2  Vancomycin + zosyn                Reason for Consult: osteomyelitis of thumb    Referring Provider: Pahwani Primary Care Provider: [name], MD   Assessment: Louis [name] is a 76 y.o. male with ESRD here with concern for ischemic ulcers of bilateral digits that has waxed over the course of 1 year with intermittent bleeding. Let thumb has progressively worsened and become more painful and swollen. Plain films report concern for osteomyelitis to tuft of thumb on left hand and thumb #4th digit on right. Clinically left thumb is quite large and affected. Other digits appear to be more c/w ischemic disease and raynaud's like phenomenon. Intraoperative cultures planned - high risk for amputation of distal thumb from what I can see.   H/O Cdiff infection in Aug 2022 following ciprofloxacin abx - some report of "allergic reaction to vancomycin PO". Upon review of events at [facility], seems very unlikely that he had an anaphylactic reaction to this given it does not absorb systemically. Also tolerated 6 days of his treatment. Will for now continue the linezolid but I think he would be a good candidate to trial vancomycin IV if needed to treat this infection. Will follow. Keep off quinolones if we can of course. Will change zosyn to ceftriaxone for now pending cultures and find a HD amenable regimen if we need to.   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 male admitted from home   Mr. Gavin is falling asleep after having had dilauded. His wife fills in the history as well as what was gathered from the chart.    Op note reviewed with Dr. Apolonio Schneiders 10/11 - gross purulence noted upon entry into the thumb; debrided soft tissue and bone. Bone was also sent for cultures. " The patient does have a significant effect of the distal phalanx of the thumb.  I think the patient is going to require further treatment.  Likely amputation to the level of the interphalangeal joint." Plan for repeat I&D 10/16.   Review of Systems: ROS - patient falling in and out of sleep = reports pain   Past Medical History:  Diagnosis Date   Arthritis    Brittle bones    per pt, has soft bones in right foot/wears boot cast!  CAD (coronary artery disease)    Cancer (HCC)    skin cancer on arm   Cataract    Bil/ surg scheduled for right eye 01/18/17/ left eye 02/08/17   Charcot ankle, right 2019   CHF (congestive heart failure) ([place]) 2015   Diabetes mellitus    Type 2   ESRD (end stage renal disease) on dialysis ([place])    [facility]   Heart failure, diastolic (HCC)    History of kidney stones    Hyperlipidemia    Hypertension    Macular degeneration disease    Macular edema 2014   OSA on CPAP    Paroxysmal atrial fibrillation (HCC)    Personal history of colonic polyps - adenomas 01/28/2014   Shortness of breath dyspnea    Syncope and collapse     Social History   Tobacco Use    Smoking status: Former    Packs/day: 0.50    Years: 20.00    Total pack years: 10.00    Types: Cigarettes    Quit date: 03/06/1977    Years since quitting: 45.5   Smokeless tobacco: Never  Vaping Use   Vaping Use: Never used  Substance Use Topics   Alcohol use: No   Drug use: No    Family History  Problem Relation Age of Onset   Diabetes Mother    Kidney disease Mother    Diabetes Father    Coronary artery disease Father    Diabetes Brother    Diabetes Sister    Prostate cancer Maternal Uncle    Diabetes Maternal Grandmother    Cancer Paternal Grandmother        unknown   Heart attack Paternal Grandfather    Colon cancer Neg Hx    Allergies  Allergen Reactions   Hydrocodone Nausea And Vomiting    Other reaction(s): GI Upset (intolerance) Projectile vomiting    Oxycodone Nausea And Vomiting    Other reaction(s): GI Upset (intolerance), Vomiting (intolerance) Projectile vomiting    Vancomycin Anaphylaxis    ORAL VANCOMYCIN for C diff.   Dacarbazine Other (See Comments)    Unknown reaction   Latex Itching and Rash   Tape Dermatitis, Itching and Rash    Patch used at dialysis    OBJECTIVE: Blood pressure 116/71, pulse 72, temperature 97.7 F (36.5 C), resp. rate 10, height '5\' 7"'$  (1.702 m), weight 105.7 kg, SpO2 100 %.  Physical Exam Vitals reviewed.  Constitutional:      Appearance: Normal appearance.     Comments: Sleepy  Cardiovascular:     Rate and Rhythm: Normal rate.     Heart sounds: No murmur heard. Pulmonary:     Effort: Pulmonary effort is normal.     Breath sounds: Normal breath sounds.  Skin:    Comments: Bruised discoloration to left neck with recent HD cath in place Raynaulds like appearance of digits L>R           Lab Results Lab Results  Component Value Date   WBC 12.6 (H) 08/30/2022   HGB 10.2 (L) 08/30/2022   HCT 33.4 (L) 08/30/2022   MCV 95.7 08/30/2022   PLT 211 08/30/2022    Lab Results  Component Value Date    CREATININE 4.67 (H) 08/30/2022   BUN 42 (H) 08/30/2022   NA 133 (L) 08/30/2022   K 4.3 08/30/2022   CL 90 (L) 08/30/2022   CO2 27 08/30/2022    Lab Results  Component Value Date   ALT 23  08/28/2022   AST 26 08/28/2022   ALKPHOS 248 (H) 08/28/2022   BILITOT 1.0 08/28/2022     Microbiology: Recent Results (from the past 240 hour(s))  Blood culture (routine x 2)     Status: None (Preliminary result)   Collection Time: 08/28/22 11:30 PM   Specimen: BLOOD RIGHT HAND  Result Value Ref Range Status   Specimen Description BLOOD RIGHT HAND  Final   Special Requests   Final    BOTTLES DRAWN AEROBIC AND ANAEROBIC Blood Culture adequate volume   Culture   Final    NO GROWTH 2 DAYS Performed at [facility], [place] [address], [place], [place] [address]    Report Status PENDING  Incomplete  Blood culture (routine x 2)     Status: None (Preliminary result)   Collection Time: 08/29/22  5:40 AM   Specimen: BLOOD RIGHT FOREARM  Result Value Ref Range Status   Specimen Description BLOOD RIGHT FOREARM  Final   Special Requests   Final    BOTTLES DRAWN AEROBIC AND ANAEROBIC Blood Culture results may not be optimal due to an inadequate volume of blood received in culture bottles   Culture   Final    NO GROWTH 1 DAY Performed at [facility], [place] [address], [place], [place] [address]    Report Status PENDING  Incomplete  MRSA Next Gen by PCR, Nasal     Status: Abnormal   Collection Time: 08/30/22  3:41 AM   Specimen: Nasal Mucosa; Nasal Swab  Result Value Ref Range Status   MRSA by PCR Next Gen DETECTED (A) NOT DETECTED Final    Comment: CRITICAL RESULT CALLED TO, READ BACK BY AND VERIFIED WITH:  C/ ERIC N., RN 08/30/22 0548 A. LAFRANCE (NOTE) The GeneXpert MRSA Assay (FDA approved for NASAL specimens only), is one component of a comprehensive MRSA colonization surveillance program. It is not intended to diagnose MRSA infection nor to guide or monitor treatment for MRSA  infections. Test performance is not FDA approved in patients less than 65 years old. Performed at [facility], [place] [address], [place], [place] [address]     Janene Madeira, MSN, NP-C [facility] for Infectious [place] Pager: [phone]  08/30/2022 2:46 PM

## 2022-08-31 ENCOUNTER — Encounter (HOSPITAL_COMMUNITY): Payer: Self-pay | Admitting: Orthopedic Surgery

## 2022-08-31 ENCOUNTER — Inpatient Hospital Stay (HOSPITAL_BASED_OUTPATIENT_CLINIC_OR_DEPARTMENT_OTHER): Payer: Medicare Other

## 2022-08-31 ENCOUNTER — Inpatient Hospital Stay (HOSPITAL_COMMUNITY): Payer: Medicare Other

## 2022-08-31 DIAGNOSIS — M79609 Pain in unspecified limb: Secondary | ICD-10-CM

## 2022-08-31 DIAGNOSIS — M86641 Other chronic osteomyelitis, right hand: Secondary | ICD-10-CM

## 2022-08-31 DIAGNOSIS — E1152 Type 2 diabetes mellitus with diabetic peripheral angiopathy with gangrene: Secondary | ICD-10-CM

## 2022-08-31 DIAGNOSIS — E1169 Type 2 diabetes mellitus with other specified complication: Secondary | ICD-10-CM | POA: Diagnosis not present

## 2022-08-31 DIAGNOSIS — M86042 Acute hematogenous osteomyelitis, left hand: Secondary | ICD-10-CM | POA: Diagnosis not present

## 2022-08-31 DIAGNOSIS — M86342 Chronic multifocal osteomyelitis, left hand: Secondary | ICD-10-CM | POA: Diagnosis not present

## 2022-08-31 DIAGNOSIS — E1122 Type 2 diabetes mellitus with diabetic chronic kidney disease: Secondary | ICD-10-CM

## 2022-08-31 DIAGNOSIS — Z794 Long term (current) use of insulin: Secondary | ICD-10-CM

## 2022-08-31 DIAGNOSIS — Z992 Dependence on renal dialysis: Secondary | ICD-10-CM

## 2022-08-31 LAB — RENAL FUNCTION PANEL
Albumin: 2 g/dL — ABNORMAL LOW (ref 3.5–5.0)
Anion gap: 15 (ref 5–15)
BUN: 64 mg/dL — ABNORMAL HIGH (ref 8–23)
CO2: 25 mmol/L (ref 22–32)
Calcium: 8.7 mg/dL — ABNORMAL LOW (ref 8.9–10.3)
Chloride: 88 mmol/L — ABNORMAL LOW (ref 98–111)
Creatinine, Ser: 5.62 mg/dL — ABNORMAL HIGH (ref 0.61–1.24)
GFR, Estimated: 10 mL/min — ABNORMAL LOW (ref >60)
Glucose, Bld: 208 mg/dL — ABNORMAL HIGH (ref 70–99)
Phosphorus: 7.4 mg/dL — ABNORMAL HIGH (ref 2.5–4.6)
Potassium: 4.8 mmol/L (ref 3.5–5.1)
Sodium: 128 mmol/L — ABNORMAL LOW (ref 135–145)

## 2022-08-31 LAB — CBC
HCT: 30.5 % — ABNORMAL LOW (ref 39.0–52.0)
Hemoglobin: 9.6 g/dL — ABNORMAL LOW (ref 13.0–17.0)
MCH: 30.2 pg (ref 26.0–34.0)
MCHC: 31.5 g/dL (ref 30.0–36.0)
MCV: 95.9 fL (ref 80.0–100.0)
Platelets: 206 10*3/uL (ref 150–400)
RBC: 3.18 MIL/uL — ABNORMAL LOW (ref 4.22–5.81)
RDW: 19.1 % — ABNORMAL HIGH (ref 11.5–15.5)
WBC: 13.1 10*3/uL — ABNORMAL HIGH (ref 4.0–10.5)
nRBC: 0 % (ref 0.0–0.2)

## 2022-08-31 LAB — CK: Total CK: 68 U/L (ref 49–397)

## 2022-08-31 LAB — GLUCOSE, CAPILLARY
Glucose-Capillary: 128 mg/dL — ABNORMAL HIGH (ref 70–99)
Glucose-Capillary: 169 mg/dL — ABNORMAL HIGH (ref 70–99)
Glucose-Capillary: 157 mg/dL — ABNORMAL HIGH (ref 70–99)

## 2022-08-31 LAB — HEPATITIS B SURFACE ANTIBODY, QUANTITATIVE: Hep B S AB Quant (Post): 225.8 m[IU]/mL (ref >9.9)

## 2022-08-31 MED ORDER — HEPARIN SODIUM (PORCINE) 5000 UNIT/ML IJ SOLN
5000.0000 [IU] | Freq: Three times a day (TID) | INTRAMUSCULAR | Status: DC
  Administered 2022-08-31 – 2022-09-01 (×3): 5000 [IU] via SUBCUTANEOUS
  Filled 2022-08-31 (×3): qty 1

## 2022-08-31 MED ORDER — DARBEPOETIN ALFA 60 MCG/0.3ML IJ SOSY
60.0000 ug | PREFILLED_SYRINGE | INTRAMUSCULAR | Status: DC
  Administered 2022-08-31 – 2022-09-07 (×2): 60 ug via INTRAVENOUS
  Filled 2022-08-31 (×3): qty 0.3

## 2022-08-31 MED ORDER — SEVELAMER CARBONATE 800 MG PO TABS
800.0000 mg | ORAL_TABLET | Freq: Three times a day (TID) | ORAL | Status: DC
  Administered 2022-08-31 – 2022-09-01 (×3): 800 mg via ORAL
  Filled 2022-08-31 (×3): qty 1

## 2022-08-31 MED ORDER — ANTICOAGULANT SODIUM CITRATE 4% (200MG/5ML) IV SOLN
5.0000 mL | Status: DC | PRN
  Filled 2022-08-31: qty 5

## 2022-08-31 MED ORDER — ALTEPLASE 2 MG IJ SOLR: 2.0000 mg | Freq: Once | INTRAMUSCULAR | Status: DC | PRN

## 2022-08-31 MED ORDER — SODIUM CHLORIDE 0.9 % IV SOLN
8.0000 mg/kg | INTRAVENOUS | Status: DC
  Administered 2022-09-01 – 2022-09-06 (×3): 650 mg via INTRAVENOUS
  Filled 2022-08-31 (×4): qty 13

## 2022-08-31 MED ORDER — HEPARIN SODIUM (PORCINE) 1000 UNIT/ML DIALYSIS
1000.0000 [IU] | INTRAMUSCULAR | Status: DC | PRN
  Administered 2022-08-31: 1000 [IU]
  Filled 2022-08-31: qty 1

## 2022-08-31 MED ORDER — ROSUVASTATIN CALCIUM 5 MG PO TABS
10.0000 mg | ORAL_TABLET | Freq: Every day | ORAL | Status: DC
  Administered 2022-09-01 – 2022-09-13 (×11): 10 mg via ORAL
  Filled 2022-08-31 (×12): qty 2

## 2022-08-31 MED ORDER — GUAIFENESIN ER 600 MG PO TB12: 600.0000 mg | ORAL_TABLET | Freq: Two times a day (BID) | ORAL | Status: DC | PRN

## 2022-08-31 NOTE — Progress Notes (Signed)
VASCULAR SURGERY ADDENDUM:  His upper extremity arterial duplex scan was done today.   On the right side, there are triphasic signals all the way down into the palmar arch.  There is no evidence of upper extremity arterial occlusive disease.  On the left side, there are triphasic signals all the way down to the palmar arch.  There is no evidence of significant upper extremity arterial occlusive disease.  Thus I do not think he needs an arteriogram.  Once his hand issues are resolved we could evaluate him for new access in the right arm.  However I would not do this until all the wounds are healed.  Vascular surgery will be available as needed.  Gae Gallop, MD 4:16 PM

## 2022-08-31 NOTE — Progress Notes (Signed)
Received patient in bed to unit.  Alert and oriented.  Informed consent signed and in chart.   Treatment initiated: 0805 Treatment completed: 1209  Patient tolerated well.  Transported back to the room  Alert, without acute distress.  Hand-off given to patient's nurse.   Access used: HD cath Access issues: sluggish venous pull  Total UF removed: 4000 ml Medication(s) given: Aranesp 60 mcg IVP, Tramadol '50mg'$ , heparin dwells 4200 units Post HD VS: 97.6    109/73    82    20    92% RA Post HD weight: 102.8kg   [facility]

## 2022-08-31 NOTE — Progress Notes (Signed)
Upper extremity arterial bilateral study completed.   Please see CV Proc for preliminary results.   Darlin Coco, RDMS, RVT

## 2022-08-31 NOTE — Progress Notes (Signed)
   VASCULAR SURGERY ASSESSMENT & PLAN:   POD 2 S/P LIGATION OF LEFT AV FISTULA AND PLACEMENT OF TDC: The patient had nonhealing wounds of both hands but more significantly on the left side.  Yesterday he underwent left thumb flexor sheath incision and drainage, bone curettage and debridement of osteomyelitis of the distal phalanx by Dr. Apolonio Schneiders.  We have ligated his fistula to improve blood flow.  I have also ordered a bilateral upper extremity arterial duplex.  If he has evidence of significant upper extremity arterial occlusive disease we may need to consider arteriography.  Given the wounds on the right hand also he is likely not a good candidate for access on the right.  Ultimately we may need to evaluate him for a thigh graft but I would certainly wait till all his hand issues are resolved to lower the risk of graft infection.  He will continue to use his catheter now for dialysis.   SUBJECTIVE:   No complaints this morning.  PHYSICAL EXAM:   Vitals:   08/30/22 2019 08/31/22 0500 08/31/22 0744 08/31/22 0805  BP: 112/68 123/62 103/76 109/63  Pulse: 72 73 76 73  Resp: '13 17 18 14  '$ Temp: 98 F (36.7 C) 98.6 F (37 C) 97.7 F (36.5 C)   TempSrc: Oral Oral Oral   SpO2: 97% 98% 94% 93%  Weight:   106.8 kg   Height:       Dressing on his left arm is dry. He has mild arm swelling.  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

## 2022-08-31 NOTE — Progress Notes (Signed)
PROGRESS NOTE    ALPER GUILMETTE  URK:[id number] DOB: 12-Nov-1946 DOA: 08/28/2022 PCP: [name], MD   Brief Narrative:  HPI: Louis [name] is a 76 y.o. with history of ESRD on hemodialysis, type 2 diabetes, CHF, CAD, hyperlipidemia, hypertension presented to the ER at the recommendation of his failure medicine physician.  Patient had left hand swelling abdominal swelling for the last 2 days.  He has noticed bloody drainage starting yesterday so he presented to the ER.  He has had wounds on his hands for a year however never to this severity.  In the ER he had x-rays obtained which demonstrated extensive soft tissue swelling of the left thumb concern for osteomyelitis.  Other labs were obtained which showed CBC WBC 16.5, hemoglobin 10.7, sodium 131, potassium 4.7, creatinine 6.7, lactic acid 1.5, magnesium 3.  Hand surgery was consulted in the ED the patient was admitted for IV antibiotics.  He is allergic to vancomycin so he was given linezolid.  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  Patient underwent left upper arm AV fistula ligation and placement of left IJ TDC by vascular surgery on 08/29/2022.  He then underwent Left thumb flexor sheath incision and drainage by Dr. Caralyn Guile on 08/30/2022.  Plan for surgical procedure on the right hand on Monday, according to patient.  ID on board, currently on Rocephin and daptomycin.  Culture data negative so far which we will follow.  Vascular surgery also on board.  Appreciate all.  ESRD on HD: Nephrology on board.  Hyperlipidemia: Continue Crestor.  Type 2 diabetes mellitus: Blood sugar labile.  Continue  Lantus 20 units and SSI.  Acute hyponatremia: We will defer to nephrology since he is a dialysis patient.  DVT prophylaxis: SCD's Start: 08/30/22 1455 SCDs Start: 08/29/22 0549 we will start him on heparin.   Code Status: Full Code  Family Communication: none present at bedside.  Plan of care discussed with patient in length and he/she verbalized understanding and agreed with it.  Status is: Inpatient Remains inpatient appropriate because: Needs another surgery in the right hand which is planned next week.     Estimated body mass index is 36.88 kg/m as calculated from the following:   Height as of this encounter: '5\' 7"'$  (1.702 m).   Weight as of this encounter: 106.8 kg.    Nutritional Assessment: Body mass index is 36.88 kg/m.Marland Kitchen Seen by dietician.  I agree with the assessment and plan as outlined below: Nutrition Status:        . Skin Assessment: I have examined the patient's skin and I agree with the wound assessment as performed by the wound care RN as outlined below:    Consultants:  Ortho  ID Vascular surgery Nephrology  Procedures:  As above  Antimicrobials:  Anti-infectives (From admission, onward)    Start     Dose/Rate Route Frequency Ordered Stop   09/01/22 2000  DAPTOmycin (CUBICIN) 650 mg in sodium chloride 0.9 % IVPB        8 mg/kg  81.9 kg (Adjusted) 126 mL/hr over 30 Minutes Intravenous Every 48 hours 08/31/22 0738     08/31/22  2200  DAPTOmycin (CUBICIN) 650 mg in sodium chloride 0.9 % IVPB  Status:  Discontinued        6 mg/kg  105.7 kg 126 mL/hr over 30 Minutes Intravenous Every 48 hours 08/30/22 1822 08/31/22 0738   08/30/22 1915  DAPTOmycin (CUBICIN) 650 mg in sodium chloride 0.9 % IVPB        6 mg/kg  105.7 kg 126 mL/hr over 30 Minutes Intravenous  Once 08/30/22 1822 08/30/22 2126   08/30/22 1800  cefTRIAXone (ROCEPHIN) 2 g in sodium chloride 0.9 % 100 mL IVPB        2 g 200 mL/hr over 30 Minutes Intravenous Every 24 hours 08/30/22 1018      08/29/22 1500  ceFAZolin (ANCEF) IVPB 2g/100 mL premix  Status:  Discontinued        2 g 200 mL/hr over 30 Minutes Intravenous On call to O.R. 08/29/22 1454 08/29/22 1705   08/29/22 1430  ceFAZolin (ANCEF) IVPB 2g/100 mL premix        2 g 200 mL/hr over 30 Minutes Intravenous  Once 08/29/22 1418 08/29/22 1456   08/29/22 1417  ceFAZolin (ANCEF) 2-4 GM/100ML-% IVPB       Note to Pharmacy: Cameron Sprang M: cabinet override      08/29/22 1417 08/29/22 1524   08/29/22 1400  piperacillin-tazobactam (ZOSYN) IVPB 2.25 g  Status:  Discontinued        2.25 g 100 mL/hr over 30 Minutes Intravenous Every 8 hours 08/29/22 0501 08/30/22 1018   08/29/22 0500  linezolid (ZYVOX) IVPB 600 mg  Status:  Discontinued        600 mg 300 mL/hr over 60 Minutes Intravenous Every 12 hours 08/29/22 0458 08/30/22 1738   08/29/22 0500  piperacillin-tazobactam (ZOSYN) IVPB 3.375 g        3.375 g 100 mL/hr over 30 Minutes Intravenous  Once 08/29/22 0459 08/29/22 0720         Subjective:  Patient seen and examined in dialysis unit.  He said he is feeling well other than mild pain in the left hand and the right hand.  Objective: Vitals:   08/31/22 0830 08/31/22 0900 08/31/22 0930 08/31/22 1000  BP: 115/65 110/69 116/65 105/77  Pulse: 72 85 71 72  Resp: '15 12 12 12  '$ Temp:      TempSrc:      SpO2: 95% 95% 94% 94%  Weight:      Height:        Intake/Output Summary (Last 24 hours) at 08/31/2022 1015 Last data filed at 08/30/2022 1405 Gross per 24 hour  Intake 150 ml  Output --  Net 150 ml    Filed Weights   08/29/22 2250 08/30/22 0307 08/31/22 0744  Weight: 108.7 kg 105.7 kg 106.8 kg    Examination:  General exam: Appears calm and comfortable  Respiratory system: Clear to auscultation. Respiratory effort normal. Cardiovascular system: S1 & S2 heard, RRR. No JVD, murmurs, rubs, gallops or clicks. No pedal edema. Gastrointestinal system: Abdomen is nondistended, soft and nontender. No  organomegaly or masses felt. Normal bowel sounds heard. Central nervous system: Alert and oriented. No focal neurological deficits. Extremities: Dressed in the left thumb.  Multiple ulcers in the left and right fingers. Psychiatry: Judgement and insight appear normal. Mood & affect appropriate.    Data Reviewed: I have personally reviewed following labs and imaging studies  CBC: Recent Labs  Lab 08/28/22 1630 08/28/22 2323 08/29/22 1420 08/30/22 0817 08/31/22 0800  WBC 16.5* 16.3*  --  12.6* 13.1*  NEUTROABS 14,421* 14.4*  --   --   --   HGB 10.7* 10.4* 11.9* 10.2* 9.6*  HCT 33.9* 33.3* 35.0* 33.4* 30.5*  MCV 93.1 95.7  --  95.7 95.9  PLT 217 215  --  211 416    Basic Metabolic Panel: Recent Labs  Lab 08/28/22 2323 08/29/22 1420 08/30/22 0816 08/30/22 0817 08/31/22 0800  NA 131* 127*  --  133* 128*  K 4.7 5.0  --  4.3 4.8  CL 90* 90*  --  90* 88*  CO2 27  --   --  27 25  GLUCOSE 137* 102*  --  74 208*  BUN 68* 74*  --  42* 64*  CREATININE 6.76* 7.40*  --  4.67* 5.62*  CALCIUM 8.6*  --   --  8.7* 8.7*  MG 3.0*  --   --   --   --   PHOS  --   --  4.9*  --  7.4*    GFR: Estimated Creatinine Clearance: 13 mL/min (A) (by C-G formula based on SCr of 5.62 mg/dL (H)). Liver Function Tests: Recent Labs  Lab 08/28/22 2323 08/31/22 0800  AST 26  --   ALT 23  --   ALKPHOS 248*  --   BILITOT 1.0  --   PROT 6.5  --   ALBUMIN 2.4* 2.0*    No results for input(s): "LIPASE", "AMYLASE" in the last 168 hours. No results for input(s): "AMMONIA" in the last 168 hours. Coagulation Profile: No results for input(s): "INR", "PROTIME" in the last 168 hours. Cardiac Enzymes: Recent Labs  Lab 08/31/22 0456  CKTOTAL 68   BNP (last 3 results) No results for input(s): "PROBNP" in the last 8760 hours. HbA1C: No results for input(s): "HGBA1C" in the last 72 hours. CBG: Recent Labs  Lab 08/30/22 0734 08/30/22 1045 08/30/22 1410 08/30/22 1646 08/30/22 2017  GLUCAP 92  101* 83 117* 189*    Lipid Profile: No results for input(s): "CHOL", "HDL", "LDLCALC", "TRIG", "CHOLHDL", "LDLDIRECT" in the last 72 hours. Thyroid Function Tests: No results for input(s): "TSH", "T4TOTAL", "FREET4", "T3FREE", "THYROIDAB" in the last 72 hours. Anemia Panel: No results for input(s): "VITAMINB12", "FOLATE", "FERRITIN", "TIBC", "IRON", "RETICCTPCT" in the last 72 hours. Sepsis Labs: Recent Labs  Lab 08/28/22 2323 08/29/22 0539  LATICACIDVEN 1.5 2.0*     Recent Results (from the past 240 hour(s))  Blood culture (routine x 2)     Status: None (Preliminary result)   Collection Time: 08/28/22 11:30 PM   Specimen: BLOOD RIGHT HAND  Result Value Ref Range Status   Specimen Description BLOOD RIGHT HAND  Final   Special Requests   Final    BOTTLES DRAWN AEROBIC AND ANAEROBIC Blood Culture adequate volume   Culture   Final    NO GROWTH 2 DAYS Performed at [facility], [address] [place], [place] [address]    Report Status PENDING  Incomplete  Blood culture (routine x 2)     Status: None (Preliminary result)   Collection Time: 08/29/22  5:40 AM   Specimen: BLOOD RIGHT FOREARM  Result Value Ref Range Status   Specimen Description BLOOD RIGHT FOREARM  Final   Special Requests   Final    BOTTLES DRAWN AEROBIC AND ANAEROBIC Blood Culture results may not be optimal due to an inadequate volume of blood received in culture bottles   Culture   Final    NO GROWTH 1 DAY Performed at [facility], [place] [address] [place], [place] [address]    Report Status PENDING  Incomplete  MRSA Next Gen by PCR, Nasal     Status: Abnormal   Collection Time: 08/30/22  3:41 AM   Specimen: Nasal Mucosa; Nasal Swab  Result Value Ref Range Status   MRSA by PCR Next Gen DETECTED (A) NOT DETECTED Final    Comment: CRITICAL RESULT CALLED TO, READ BACK BY AND VERIFIED WITH:  C/ ERIC N., RN 08/30/22 0548 A. LAFRANCE (NOTE) The GeneXpert MRSA Assay (FDA approved for NASAL  specimens only), is one component of a comprehensive MRSA colonization surveillance program. It is not intended to diagnose MRSA infection nor to guide or monitor treatment for MRSA infections. Test performance is not FDA approved in patients less than 22 years old. Performed at [facility], [place] [address], [place], [place] [address]   Aerobic/Anaerobic Culture w Gram Stain (surgical/deep wound)     Status: None (Preliminary result)   Collection Time: 08/30/22  1:43 PM   Specimen: PATH Other; Body Fluid  Result Value Ref Range Status   Specimen Description WOUND  Final   Special Requests LEFT THUMB ABSCESS SPEC A  Final   Gram Stain   Final    RARE WBC PRESENT, PREDOMINANTLY MONONUCLEAR FEW GRAM POSITIVE COCCI IN PAIRS IN CLUSTERS RARE GRAM NEGATIVE RODS    Culture   Final    MODERATE STAPHYLOCOCCUS AUREUS SUSCEPTIBILITIES TO FOLLOW CULTURE REINCUBATED FOR BETTER GROWTH Performed at [facility], [place] [address], [place], [place] [address]    Report Status PENDING  Incomplete  Aerobic/Anaerobic Culture w Gram Stain (surgical/deep wound)     Status: None (Preliminary result)   Collection Time: 08/30/22  1:51 PM   Specimen: PATH Other; Tissue  Result Value Ref Range Status   Specimen Description TISSUE  Final   Special Requests LEFT THUMB BONE  Final   Gram Stain   Final    NO WBC SEEN RARE GRAM POSITIVE COCCI IN CLUSTERS    Culture   Final    MODERATE STAPHYLOCOCCUS AUREUS CULTURE REINCUBATED FOR BETTER GROWTH Performed at [facility], [place] [address], [place], [place] [address]    Report Status PENDING  Incomplete     Radiology Studies: DG Chest Port 1 View  Result Date: 08/29/2022 CLINICAL DATA:  Placement of dialysis catheter, difficulty breathing EXAM: PORTABLE CHEST 1 VIEW COMPARISON:  10/27/2019 FINDINGS: There is interval placement of left IJ dialysis catheter with its tip in right atrium. Transverse diameter of heart is increased. There is  increased density in left lower lung fields suggesting possible moderate sized left pleural effusion and underlying atelectasis/pneumonia. Central pulmonary vessels are prominent. Right lung is clear of any focal infiltrates. There is no pneumothorax. IMPRESSION: Tip of dialysis catheter is seen in the region of right atrium. There is no pneumothorax. Cardiomegaly. Moderate left pleural effusion. Possible atelectasis/pneumonia in left mid and left lower lung fields. Central pulmonary vessels are prominent without signs of alveolar pulmonary edema. Electronically Signed   By: Elmer Picker M.D.   On: 08/29/2022 16:44   DG C-Arm 1-60 Min-No Report  Result Date: 08/29/2022 Fluoroscopy was utilized by the requesting physician.  No radiographic interpretation.    Scheduled Meds:  Chlorhexidine Gluconate Cloth  6 each Topical Q0600   Chlorhexidine Gluconate Cloth  6 each Topical Q0600   darbepoetin (ARANESP) injection - DIALYSIS  60 mcg Intravenous Q Thu-HD   insulin aspart  0-24  Units Subcutaneous TID WC   insulin aspart  4 Units Subcutaneous TID WC   insulin detemir  20 Units Subcutaneous Daily   midodrine  10 mg Oral Q T,Th,Sa-HD   mupirocin ointment  1 Application Nasal BID   rosuvastatin  40 mg Oral Daily   sevelamer carbonate  800 mg Oral TID WC   Continuous Infusions:  anticoagulant sodium citrate     cefTRIAXone (ROCEPHIN)  IV 2 g (08/30/22 1721)   [START ON 09/01/2022] DAPTOmycin (CUBICIN) 650 mg in sodium chloride 0.9 % IVPB       LOS: 1 day   Darliss Cheney, MD [facility]  08/31/2022, 10:15 AM   *Please note that this is a verbal dictation therefore any spelling or grammatical errors are due to the "[employer]" system interpretation.  Please page via [place] and do not message via secure chat for urgent patient care matters. Secure chat can be used for non urgent patient care matters.  How to contact the [facility] Attending or Consulting provider [place] or  covering provider during after hours [place], for this patient?  Check the care team in [facility] and look for a) attending/consulting TRH provider listed and b) the [facility] team listed. Page or secure chat [phone]. Log into [email or web address] and use [place]'s universal password to access. If you do not have the password, please contact the hospital operator. Locate the [facility] provider you are looking for under [facility] and page to a number that you can be directly reached. If you still have difficulty reaching the provider, please page the [facility] (Director on Call) for the Hospitalists listed on amion for assistance.

## 2022-08-31 NOTE — Progress Notes (Signed)
[facility] Progress Note   Subjective:    Seen and examined patient on HD. S/p bone curettage and debridement of L thumb yesterday by Dr. Caralyn Guile. Denies SOB, CP, and N/V. Tolerating UFG 4L.  Objective Vitals:   08/31/22 0805 08/31/22 0830 08/31/22 0900 08/31/22 0930  BP: 109/63 115/65 110/69 116/65  Pulse: 73 72 85 71  Resp: '14 15 12 12  '$ Temp:      TempSrc:      SpO2: 93% 95% 95% 94%  Weight:      Height:       Physical Exam General: Awake, alert, chronically-ill appearing, on RA Heart: S1 and S2; No MGRs Lungs: Clear anteriorly Abdomen: Soft and non-tender Extremities: 1-2+ edema from thighs to ankles Dialysis Access: L TDC; L AVF (+) B (ace wrap applied)  Filed Weights   08/29/22 2250 08/30/22 0307 08/31/22 0744  Weight: 108.7 kg 105.7 kg 106.8 kg    Intake/Output Summary (Last 24 hours) at 08/31/2022 0946 Last data filed at 08/30/2022 1405 Gross per 24 hour  Intake 150 ml  Output --  Net 150 ml    Additional Objective Labs: Basic Metabolic Panel: Recent Labs  Lab 08/28/22 2323 08/29/22 1420 08/30/22 0816 08/30/22 0817 08/31/22 0800  NA 131* 127*  --  133* 128*  K 4.7 5.0  --  4.3 4.8  CL 90* 90*  --  90* 88*  CO2 27  --   --  27 25  GLUCOSE 137* 102*  --  74 208*  BUN 68* 74*  --  42* 64*  CREATININE 6.76* 7.40*  --  4.67* 5.62*  CALCIUM 8.6*  --   --  8.7* 8.7*  PHOS  --   --  4.9*  --  7.4*   Liver Function Tests: Recent Labs  Lab 08/28/22 2323 08/31/22 0800  AST 26  --   ALT 23  --   ALKPHOS 248*  --   BILITOT 1.0  --   PROT 6.5  --   ALBUMIN 2.4* 2.0*   No results for input(s): "LIPASE", "AMYLASE" in the last 168 hours. CBC: Recent Labs  Lab 08/28/22 1630 08/28/22 2323 08/29/22 1420 08/30/22 0817 08/31/22 0800  WBC 16.5* 16.3*  --  12.6* 13.1*  NEUTROABS 14,421* 14.4*  --   --   --   HGB 10.7* 10.4* 11.9* 10.2* 9.6*  HCT 33.9* 33.3* 35.0* 33.4* 30.5*  MCV 93.1 95.7  --  95.7 95.9  PLT 217 215  --  211 206    Blood Culture    Component Value Date/Time   SDES TISSUE 08/30/2022 1351   SPECREQUEST LEFT THUMB BONE 08/30/2022 1351   CULT  08/30/2022 1351    MODERATE STAPHYLOCOCCUS AUREUS CULTURE REINCUBATED FOR BETTER GROWTH Performed at [facility], [place] [address], [place], [place] [address]    REPTSTATUS PENDING 08/30/2022 1351    Cardiac Enzymes: Recent Labs  Lab 08/31/22 0456  CKTOTAL 68   CBG: Recent Labs  Lab 08/30/22 0734 08/30/22 1045 08/30/22 1410 08/30/22 1646 08/30/22 2017  GLUCAP 92 101* 83 117* 189*   Iron Studies: No results for input(s): "IRON", "TIBC", "TRANSFERRIN", "FERRITIN" in the last 72 hours. Lab Results  Component Value Date   INR 2.5 06/27/2021   INR 2.1 06/08/2021   INR 2.3 05/18/2021   Studies/Results: DG Chest Port 1 View  Result Date: 08/29/2022 CLINICAL DATA:  Placement of dialysis catheter, difficulty breathing EXAM: PORTABLE CHEST 1 VIEW COMPARISON:  10/27/2019 FINDINGS:  There is interval placement of left IJ dialysis catheter with its tip in right atrium. Transverse diameter of heart is increased. There is increased density in left lower lung fields suggesting possible moderate sized left pleural effusion and underlying atelectasis/pneumonia. Central pulmonary vessels are prominent. Right lung is clear of any focal infiltrates. There is no pneumothorax. IMPRESSION: Tip of dialysis catheter is seen in the region of right atrium. There is no pneumothorax. Cardiomegaly. Moderate left pleural effusion. Possible atelectasis/pneumonia in left mid and left lower lung fields. Central pulmonary vessels are prominent without signs of alveolar pulmonary edema. Electronically Signed   By: Elmer Picker M.D.   On: 08/29/2022 16:44   DG C-Arm 1-60 Min-No Report  Result Date: 08/29/2022 Fluoroscopy was utilized by the requesting physician.  No radiographic interpretation.    Medications:  cefTRIAXone (ROCEPHIN)  IV 2 g (08/30/22 1721)   [START  ON 09/01/2022] DAPTOmycin (CUBICIN) 650 mg in sodium chloride 0.9 % IVPB      Chlorhexidine Gluconate Cloth  6 each Topical Q0600   Chlorhexidine Gluconate Cloth  6 each Topical Q0600   insulin aspart  0-24 Units Subcutaneous TID WC   insulin aspart  4 Units Subcutaneous TID WC   insulin detemir  20 Units Subcutaneous Daily   midodrine  10 mg Oral Q T,Th,Sa-HD   mupirocin ointment  1 Application Nasal BID   rosuvastatin  40 mg Oral Daily    Dialysis Orders: TTS SW 4h 450/ 500  101 kg  3K/2.5 bath  Hep none LUA AVF - last HD post HD 101.6kg on 10/7 - last Hb 10.0 10/5 - mircera 50 q 2, last 9/26 - venofer 100 mg tiw IV thru 10/19 - doxercalciferol 5 ug ttw IV  Home meds include - eliquis, furosemide 40 qd, isordil 20, midodrine 10 prn, novolin insulin, rosuvastatin, vits/ supps / prns  Assessment/Plan: Acute osteomyelitis L hand - thumb mostly, started on IV zosyn / zyvox (vanc allergy). VVS ligated pt's L arm AVF to improve circulation.  ESRD - on HD TTS. On HD-tolerating UFG 4L. HD access - new L IJ TDC, L AVF ligated 10/10.  DM2 - on insulin BP/ volume overload - BP's okay but patient has marked vol overload w/ diffuse LE edema. No resp issues, on RA. Will order CXR in AM. Was just started on pre HD midodrine '10mg'$  tts, continue while here. May need serial HD-will assess need.  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 Hold IV Fe hold given infection.  MBD ckd - CCa in range and phos. Cont IV vdra and will start Renvela.  Tobie Poet, NP [facility] 08/31/2022,9:46 AM  LOS: 1 day

## 2022-08-31 NOTE — Progress Notes (Signed)
Pharmacy Antibiotic Note  [name] is a 76 y.o. male  with osteomyelitis.   Pharmacy has been consulted for daptomycin dosing. He is noted with an allergy to vancomycin. He also has ESRD and is on HD TTS  Currently HD-TTS but renal notes mention potential need for extra sessions. Will continue q48h dosing for now with eventual plans to transition to post-HD dosing once need for additional sessions ruled out.   CK 68 on 10/12 - will monitor weekly  Culture data currently showing staph aureus (pending sensi), also noted GNR on gram stain  Plan: - Continue Daptomycin 650 mg (8 mg/kg AdjBW) every 48 hours for now - CK checks every Thursday - Will continue to follow HD schedule/duration, culture results, LOT, and antibiotic de-escalation plans   Height: '5\' 7"'$  (170.2 cm) Weight: 105.7 kg (233 lb 0.4 oz) IBW/kg (Calculated) : 66.1  Temp (24hrs), Avg:98 F (36.7 C), Min:97.6 F (36.4 C), Max:98.6 F (37 C)  Recent Labs  Lab 08/28/22 1630 08/28/22 2323 08/29/22 0539 08/29/22 1420 08/30/22 0817  WBC 16.5* 16.3*  --   --  12.6*  CREATININE  --  6.76*  --  7.40* 4.67*  LATICACIDVEN  --  1.5 2.0*  --   --      Estimated Creatinine Clearance: 15.6 mL/min (A) (by C-G formula based on SCr of 4.67 mg/dL (H)).    Allergies  Allergen Reactions   Hydrocodone Nausea And Vomiting    Other reaction(s): GI Upset (intolerance) Projectile vomiting    Oxycodone Nausea And Vomiting    Other reaction(s): GI Upset (intolerance), Vomiting (intolerance) Projectile vomiting    Vancomycin Anaphylaxis    ORAL VANCOMYCIN for C diff.   Dacarbazine Other (See Comments)    Unknown reaction   Latex Itching and Rash   Tape Dermatitis, Itching and Rash    Patch used at dialysis    Antimicrobials this admission: Linezolid 10/10>> 10/11 Zosyn 10/10 >> 10/11 Daptomycin 10/11 Rocephin 10/11>>  Dose adjustments this admission:  Microbiology results: 10/10 blood x2 10/11 wound cx  Thank  you for allowing pharmacy to be a part of this patient's care.  Alycia Rossetti, PharmD, BCPS Infectious Diseases Clinical Pharmacist 08/31/2022 1:21 PM   **Pharmacist phone directory can now be found on [email or web address] ([email or web address]).  Listed under [place].

## 2022-08-31 NOTE — Progress Notes (Signed)
Subjective: No new complaints receiving HD   Antibiotics:  Anti-infectives (From admission, onward)    Start     Dose/Rate Route Frequency Ordered Stop   09/01/22 2000  DAPTOmycin (CUBICIN) 650 mg in sodium chloride 0.9 % IVPB        8 mg/kg  81.9 kg (Adjusted) 126 mL/hr over 30 Minutes Intravenous Every 48 hours 08/31/22 0738     08/31/22 2200  DAPTOmycin (CUBICIN) 650 mg in sodium chloride 0.9 % IVPB  Status:  Discontinued        6 mg/kg  105.7 kg 126 mL/hr over 30 Minutes Intravenous Every 48 hours 08/30/22 1822 08/31/22 0738   08/30/22 1915  DAPTOmycin (CUBICIN) 650 mg in sodium chloride 0.9 % IVPB        6 mg/kg  105.7 kg 126 mL/hr over 30 Minutes Intravenous  Once 08/30/22 1822 08/30/22 2126   08/30/22 1800  cefTRIAXone (ROCEPHIN) 2 g in sodium chloride 0.9 % 100 mL IVPB        2 g 200 mL/hr over 30 Minutes Intravenous Every 24 hours 08/30/22 1018     08/29/22 1500  ceFAZolin (ANCEF) IVPB 2g/100 mL premix  Status:  Discontinued        2 g 200 mL/hr over 30 Minutes Intravenous On call to O.R. 08/29/22 1454 08/29/22 1705   08/29/22 1430  ceFAZolin (ANCEF) IVPB 2g/100 mL premix        2 g 200 mL/hr over 30 Minutes Intravenous  Once 08/29/22 1418 08/29/22 1456   08/29/22 1417  ceFAZolin (ANCEF) 2-4 GM/100ML-% IVPB       Note to Pharmacy: Cameron Sprang M: cabinet override      08/29/22 1417 08/29/22 1524   08/29/22 1400  piperacillin-tazobactam (ZOSYN) IVPB 2.25 g  Status:  Discontinued        2.25 g 100 mL/hr over 30 Minutes Intravenous Every 8 hours 08/29/22 0501 08/30/22 1018   08/29/22 0500  linezolid (ZYVOX) IVPB 600 mg  Status:  Discontinued        600 mg 300 mL/hr over 60 Minutes Intravenous Every 12 hours 08/29/22 0458 08/30/22 1738   08/29/22 0500  piperacillin-tazobactam (ZOSYN) IVPB 3.375 g        3.375 g 100 mL/hr over 30 Minutes Intravenous  Once 08/29/22 0459 08/29/22 0720       Medications: Scheduled Meds:  Chlorhexidine Gluconate Cloth   6 each Topical Q0600   Chlorhexidine Gluconate Cloth  6 each Topical Q0600   darbepoetin (ARANESP) injection - DIALYSIS  60 mcg Intravenous Q Thu-HD   heparin injection (subcutaneous)  5,000 Units Subcutaneous Q8H   insulin aspart  0-24 Units Subcutaneous TID WC   insulin aspart  4 Units Subcutaneous TID WC   insulin detemir  20 Units Subcutaneous Daily   midodrine  10 mg Oral Q T,Th,Sa-HD   mupirocin ointment  1 Application Nasal BID   rosuvastatin  40 mg Oral Daily   sevelamer carbonate  800 mg Oral TID WC   Continuous Infusions:  anticoagulant sodium citrate     cefTRIAXone (ROCEPHIN)  IV 2 g (08/30/22 1721)   [START ON 09/01/2022] DAPTOmycin (CUBICIN) 650 mg in sodium chloride 0.9 % IVPB     PRN Meds:.acetaminophen **OR** acetaminophen, alteplase, anticoagulant sodium citrate, heparin, HYDROmorphone (DILAUDID) injection, ondansetron (ZOFRAN) IV, traMADol    Objective: Weight change:   Intake/Output Summary (Last 24 hours) at 08/31/2022 1156 Last data filed at 08/30/2022 1405 Gross per 24 hour  Intake 150 ml  Output --  Net 150 ml   Blood pressure 111/61, pulse (!) 103, temperature 97.7 F (36.5 C), temperature source Oral, resp. rate (!) 22, height '5\' 7"'$  (1.702 m), weight 106.8 kg, SpO2 96 %. Temp:  [97.6 F (36.4 C)-98.6 F (37 C)] 97.7 F (36.5 C) (10/12 0744) Pulse Rate:  [54-103] 103 (10/12 1130) Resp:  [9-22] 22 (10/12 1130) BP: (103-123)/(58-79) 111/61 (10/12 1130) SpO2:  [93 %-100 %] 96 % (10/12 1130) Weight:  [106.8 kg] 106.8 kg (10/12 0744)  Physical Exam: Physical Exam Constitutional:      Appearance: He is well-developed.  HENT:     Head: Normocephalic and atraumatic.  Eyes:     Conjunctiva/sclera: Conjunctivae normal.  Cardiovascular:     Rate and Rhythm: Normal rate and regular rhythm.  Pulmonary:     Effort: Pulmonary effort is normal. No respiratory distress.     Breath sounds: Normal breath sounds. No stridor. No wheezing.  Abdominal:      General: There is no distension.     Palpations: Abdomen is soft.  Musculoskeletal:     Cervical back: Normal range of motion and neck supple.  Skin:    General: Skin is warm and dry.     Findings: No erythema or rash.  Neurological:     General: No focal deficit present.     Mental Status: He is alert and oriented to person, place, and time.  Psychiatric:        Mood and Affect: Mood normal.        Behavior: Behavior normal.        Thought Content: Thought content normal.        Judgment: Judgment normal.     Left thumb bandaged  Skin changes relatively stable CBC:    BMET Recent Labs    08/30/22 0817 08/31/22 0800  NA 133* 128*  K 4.3 4.8  CL 90* 88*  CO2 27 25  GLUCOSE 74 208*  BUN 42* 64*  CREATININE 4.67* 5.62*  CALCIUM 8.7* 8.7*     Liver Panel  Recent Labs    08/28/22 2323 08/31/22 0800  PROT 6.5  --   ALBUMIN 2.4* 2.0*  AST 26  --   ALT 23  --   ALKPHOS 248*  --   BILITOT 1.0  --        Sedimentation Rate Recent Labs    08/29/22 0639  ESRSEDRATE 65*   C-Reactive Protein Recent Labs    08/29/22 0639  CRP 22.8*    Micro Results: Recent Results (from the past 720 hour(s))  Blood culture (routine x 2)     Status: None (Preliminary result)   Collection Time: 08/28/22 11:30 PM   Specimen: BLOOD RIGHT HAND  Result Value Ref Range Status   Specimen Description BLOOD RIGHT HAND  Final   Special Requests   Final    BOTTLES DRAWN AEROBIC AND ANAEROBIC Blood Culture adequate volume   Culture   Final    NO GROWTH 3 DAYS Performed at [facility], [place] [address], [place], [place] [address]    Report Status PENDING  Incomplete  Blood culture (routine x 2)     Status: None (Preliminary result)   Collection Time: 08/29/22  5:40 AM   Specimen: BLOOD RIGHT FOREARM  Result Value Ref Range Status   Specimen Description BLOOD RIGHT FOREARM  Final   Special Requests   Final    BOTTLES DRAWN AEROBIC AND ANAEROBIC Blood  Culture results may  not be optimal due to an inadequate volume of blood received in culture bottles   Culture   Final    NO GROWTH 2 DAYS Performed at [facility], [place] [address] [place], [place] [address]    Report Status PENDING  Incomplete  MRSA Next Gen by PCR, Nasal     Status: Abnormal   Collection Time: 08/30/22  3:41 AM   Specimen: Nasal Mucosa; Nasal Swab  Result Value Ref Range Status   MRSA by PCR Next Gen DETECTED (A) NOT DETECTED Final    Comment: CRITICAL RESULT CALLED TO, READ BACK BY AND VERIFIED WITH:  C/ ERIC N., RN 08/30/22 0548 A. LAFRANCE (NOTE) The GeneXpert MRSA Assay (FDA approved for NASAL specimens only), is one component of a comprehensive MRSA colonization surveillance program. It is not intended to diagnose MRSA infection nor to guide or monitor treatment for MRSA infections. Test performance is not FDA approved in patients less than 43 years old. Performed at [facility], [place] [address], [place], [place] [address]   Aerobic/Anaerobic Culture w Gram Stain (surgical/deep wound)     Status: None (Preliminary result)   Collection Time: 08/30/22  1:43 PM   Specimen: PATH Other; Body Fluid  Result Value Ref Range Status   Specimen Description WOUND  Final   Special Requests LEFT THUMB ABSCESS SPEC A  Final   Gram Stain   Final    RARE WBC PRESENT, PREDOMINANTLY MONONUCLEAR FEW GRAM POSITIVE COCCI IN PAIRS IN CLUSTERS RARE GRAM NEGATIVE RODS    Culture   Final    MODERATE STAPHYLOCOCCUS AUREUS SUSCEPTIBILITIES TO FOLLOW CULTURE REINCUBATED FOR BETTER GROWTH Performed at [facility], [place] [address], [place], [place] [address]    Report Status PENDING  Incomplete  Aerobic/Anaerobic Culture w Gram Stain (surgical/deep wound)     Status: None (Preliminary result)   Collection Time: 08/30/22  1:51 PM   Specimen: PATH Other; Tissue  Result Value Ref Range Status   Specimen Description TISSUE  Final   Special Requests LEFT THUMB BONE  Final   Gram Stain    Final    NO WBC SEEN RARE GRAM POSITIVE COCCI IN CLUSTERS    Culture   Final    MODERATE STAPHYLOCOCCUS AUREUS CULTURE REINCUBATED FOR BETTER GROWTH Performed at [facility], [place] [address] [place], [place] [address]    Report Status PENDING  Incomplete    Studies/Results: DG Chest Port 1 View  Result Date: 08/29/2022 CLINICAL DATA:  Placement of dialysis catheter, difficulty breathing EXAM: PORTABLE CHEST 1 VIEW COMPARISON:  10/27/2019 FINDINGS: There is interval placement of left IJ dialysis catheter with its tip in right atrium. Transverse diameter of heart is increased. There is increased density in left lower lung fields suggesting possible moderate sized left pleural effusion and underlying atelectasis/pneumonia. Central pulmonary vessels are prominent. Right lung is clear of any focal infiltrates. There is no pneumothorax. IMPRESSION: Tip of dialysis catheter is seen in the region of right atrium. There is no pneumothorax. Cardiomegaly. Moderate left pleural effusion. Possible atelectasis/pneumonia in left mid and left lower lung fields. Central pulmonary vessels are prominent without signs of alveolar pulmonary edema. Electronically Signed   By: Elmer Picker M.D.   On: 08/29/2022 16:44   DG C-Arm 1-60 Min-No Report  Result Date: 08/29/2022 Fluoroscopy was utilized by the requesting physician.  No radiographic interpretation.      Assessment/Plan:  INTERVAL HISTORY: Cultures are growing  Pavlou coccus aureus though there is mention of a gram-negative rod on Gram stain.   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 male with end-stage renal disease on hemodialysis who has had tingling and waning ischemic digits  of both hands now with osteomyelitis involving the left great t distal phalanx x humb as well as possible osteomyelitis in the right hand involving of the right thumb as well as the fourth distal phalanx  #1 Osteomyelitis: He is underground ligation of left upper extremity AV fistula to improve blood flow in his hand and now undergone I&D of the left thumb flexor sheath incision and drainage of the bone curettage and debridement of the distal phalanx.  Intraoperative cultures are already growing Staphylococcus aureus.  In addition to gram-positive cocci gram-negative rod was seen on Gram stain though we do not know whether this represents an anaerobic organism anaerobic organism or a missed read by the tech.  For now we will continue the patient on daptomycin and ceftriaxone  2.  History of colostrum difficile colitis hopefully we can narrow him to antibiotics that are less risky for causing C. Difficile  3 Vancomycin allergy: As discussed yesterday this would seem highly unusual but we will steer away from vancomycin for now  I spent 70mnutes with the patient including than 50% of the time in face to face counseling of the patient writing his osteomyelitis of bilateral hands, along with review of medical records in preparation for the visit and during the visit and in coordination of his care.     LOS: 1 day   CAlcide Evener10/10/2022, 11:56 AM

## 2022-08-31 NOTE — Progress Notes (Signed)
Called to patient's room by Crissy, RN due to difficulty swallowing. Patient's wife had come to the desk shouting that the patient was choking. Patient was found to be coughing laying in the bed with the [facility] about 45 degrees. The bed was immediately raised to the highest elevation possible and the patient's coughing subsided. Patient continued to have bouts of coughing and wheezing. Dr. Doristine Bosworth was notified of possible aspiration. Patient's wife also states that this happened last week at home and the patient was coughing so hard that it "caused him to pass out". MD aware and orders received.

## 2022-09-01 ENCOUNTER — Inpatient Hospital Stay (HOSPITAL_COMMUNITY): Payer: Medicare Other

## 2022-09-01 DIAGNOSIS — A4902 Methicillin resistant Staphylococcus aureus infection, unspecified site: Secondary | ICD-10-CM

## 2022-09-01 DIAGNOSIS — M86342 Chronic multifocal osteomyelitis, left hand: Secondary | ICD-10-CM | POA: Diagnosis not present

## 2022-09-01 DIAGNOSIS — M86341 Chronic multifocal osteomyelitis, right hand: Secondary | ICD-10-CM

## 2022-09-01 DIAGNOSIS — Z8619 Personal history of other infectious and parasitic diseases: Secondary | ICD-10-CM

## 2022-09-01 DIAGNOSIS — M86042 Acute hematogenous osteomyelitis, left hand: Secondary | ICD-10-CM | POA: Diagnosis not present

## 2022-09-01 DIAGNOSIS — A499 Bacterial infection, unspecified: Secondary | ICD-10-CM

## 2022-09-01 LAB — CBC WITH DIFFERENTIAL/PLATELET
Abs Immature Granulocytes: 0.09 10*3/uL — ABNORMAL HIGH (ref 0.00–0.07)
Basophils Absolute: 0 10*3/uL (ref 0.0–0.1)
Basophils Relative: 0 %
Eosinophils Absolute: 0 10*3/uL (ref 0.0–0.5)
Eosinophils Relative: 0 %
HCT: 33.4 % — ABNORMAL LOW (ref 39.0–52.0)
Hemoglobin: 10.6 g/dL — ABNORMAL LOW (ref 13.0–17.0)
Immature Granulocytes: 1 %
Lymphocytes Relative: 4 %
Lymphs Abs: 0.5 10*3/uL — ABNORMAL LOW (ref 0.7–4.0)
MCH: 30.3 pg (ref 26.0–34.0)
MCHC: 31.7 g/dL (ref 30.0–36.0)
MCV: 95.4 fL (ref 80.0–100.0)
Monocytes Absolute: 1 10*3/uL (ref 0.1–1.0)
Monocytes Relative: 7 %
Neutro Abs: 12.3 10*3/uL — ABNORMAL HIGH (ref 1.7–7.7)
Neutrophils Relative %: 88 %
Platelets: 270 10*3/uL (ref 150–400)
RBC: 3.5 MIL/uL — ABNORMAL LOW (ref 4.22–5.81)
RDW: 19.6 % — ABNORMAL HIGH (ref 11.5–15.5)
WBC: 13.8 10*3/uL — ABNORMAL HIGH (ref 4.0–10.5)
nRBC: 0.3 % — ABNORMAL HIGH (ref 0.0–0.2)

## 2022-09-01 LAB — GLUCOSE, CAPILLARY
Glucose-Capillary: 96 mg/dL (ref 70–99)
Glucose-Capillary: 152 mg/dL — ABNORMAL HIGH (ref 70–99)
Glucose-Capillary: 124 mg/dL — ABNORMAL HIGH (ref 70–99)
Glucose-Capillary: 193 mg/dL — ABNORMAL HIGH (ref 70–99)

## 2022-09-01 LAB — PROCALCITONIN: Procalcitonin: 1.81 ng/mL

## 2022-09-01 MED ORDER — APIXABAN 5 MG PO TABS
5.0000 mg | ORAL_TABLET | Freq: Two times a day (BID) | ORAL | Status: AC
  Administered 2022-09-01 – 2022-09-03 (×5): 5 mg via ORAL
  Filled 2022-09-01 (×5): qty 1

## 2022-09-01 MED ORDER — CHLORHEXIDINE GLUCONATE CLOTH 2 % EX PADS: 6.0000 | MEDICATED_PAD | Freq: Every day | CUTANEOUS | Status: DC

## 2022-09-01 MED ORDER — FUROSEMIDE 40 MG PO TABS
80.0000 mg | ORAL_TABLET | Freq: Two times a day (BID) | ORAL | Status: DC
  Administered 2022-09-01 – 2022-09-13 (×21): 80 mg via ORAL
  Filled 2022-09-01 (×23): qty 2

## 2022-09-01 MED ORDER — SACCHAROMYCES BOULARDII 250 MG PO CAPS: 250.0000 mg | ORAL_CAPSULE | Freq: Two times a day (BID) | ORAL | Status: AC

## 2022-09-01 MED ORDER — SODIUM CHLORIDE 0.9 % IV SOLN
1.0000 g | Freq: Once | INTRAVENOUS | Status: AC
  Administered 2022-09-01: 1 g via INTRAVENOUS
  Filled 2022-09-01: qty 1

## 2022-09-01 MED ORDER — MIDODRINE HCL 5 MG PO TABS: 10.0000 mg | ORAL_TABLET | ORAL | Status: DC

## 2022-09-01 MED ORDER — SEVELAMER CARBONATE 800 MG PO TABS
1600.0000 mg | ORAL_TABLET | Freq: Three times a day (TID) | ORAL | Status: DC
  Administered 2022-09-01 – 2022-09-13 (×32): 1600 mg via ORAL
  Filled 2022-09-01 (×31): qty 2

## 2022-09-01 MED ORDER — ALUM & MAG HYDROXIDE-SIMETH 200-200-20 MG/5ML PO SUSP
30.0000 mL | ORAL | Status: DC | PRN
  Administered 2022-09-01 – 2022-09-03 (×6): 30 mL via ORAL
  Filled 2022-09-01 (×7): qty 30

## 2022-09-01 MED ORDER — SODIUM CHLORIDE 0.9 % IV SOLN
2.0000 g | INTRAVENOUS | Status: DC
  Administered 2022-09-02 – 2022-09-09 (×5): 2 g via INTRAVENOUS
  Filled 2022-09-01 (×7): qty 2

## 2022-09-01 NOTE — Evaluation (Signed)
Clinical/Bedside Swallow Evaluation Patient Details  Name: Louis [name] MRN: [id number] Date of Birth: 1946/01/08  Today's Date: 09/01/2022 Time: SLP Start Time (ACUTE ONLY): 1010 SLP Stop Time (ACUTE ONLY): 9024 SLP Time Calculation (min) (ACUTE ONLY): 23 min  Past Medical History:  Past Medical History:  Diagnosis Date   Arthritis    Brittle bones    per pt, has soft bones in right foot/wears boot cast!   CAD (coronary artery disease)    Cancer ([place])    skin cancer on arm   Cataract    Bil/ surg scheduled for right eye 01/18/17/ left eye 02/08/17   Charcot ankle, right 2019   CHF (congestive heart failure) ([place]) 2015   Diabetes mellitus    Type 2   ESRD (end stage renal disease) on dialysis ([place])    Tu/Th/Sa Dialysis   Heart failure, diastolic ([place])    History of kidney stones    Hyperlipidemia    Hypertension    Macular degeneration disease    Macular edema 2014   OSA on CPAP    Paroxysmal atrial fibrillation ([place])    Personal history of colonic polyps - adenomas 01/28/2014   Shortness of breath dyspnea    Syncope and collapse    Past Surgical History:  Past Surgical History:  Procedure Laterality Date   AV FISTULA PLACEMENT Left 09/01/2020   Procedure: LEFT ARM ARTERIOVENOUS (AV) FISTULA;  Surgeon: Waynetta Sandy, MD;  Location: [facility];  Service: Vascular;  Laterality: Left;   [place] Left 10/27/2020   Procedure: LEFT SECOND STAGE BASILIC VEIN FISTULA TRANSPOSITION;  Surgeon: Waynetta Sandy, MD;  Location: [facility];  Service: Vascular;  Laterality: Left;   CARPAL TUNNEL RELEASE     left hand   COLONOSCOPY     I & D EXTREMITY Left 08/30/2022   Procedure: IRRIGATION AND DEBRIDEMENT LEFT THUMB;  Surgeon: Iran Planas, MD;  Location: [facility];  Service: Orthopedics;  Laterality: Left;   INSERTION OF DIALYSIS CATHETER Left 08/29/2022   Procedure: INSERTION OF DIALYSIS CATHETER;  Surgeon: Angelia Mould, MD;  Location: [facility];  Service: Vascular;  Laterality: Left;   INTRAVASCULAR PRESSURE WIRE/FFR STUDY N/A 12/18/2018   Procedure: INTRAVASCULAR PRESSURE WIRE/FFR STUDY;  Surgeon: Wellington Hampshire, MD;  Location: [facility];  Service: Cardiovascular;  Laterality: N/A;   IR FLUORO GUIDE CV LINE RIGHT  12/16/2018   IR US GUIDE VASC ACCESS RIGHT  12/16/2018   KNEE ARTHROSCOPY Right 09/13/2016   [facility], Dr. Dorna Leitz   LIGATION OF ARTERIOVENOUS  FISTULA Left 08/29/2022   Procedure: LIGATION OF ARTERIOVENOUS  FISTULA LEFT ARM;  Surgeon: Angelia Mould, MD;  Location: [facility];  Service: Vascular;  Laterality: Left;   PILONIDAL CYST EXCISION     RIGHT/LEFT HEART CATH AND CORONARY ANGIOGRAPHY N/A 12/18/2018   Procedure: RIGHT/LEFT HEART CATH AND CORONARY ANGIOGRAPHY;  Surgeon: Wellington Hampshire, MD;  Location: [facility];  Service: Cardiovascular;  Laterality: N/A;   HPI:  76yo male admitted 08/28/22 with thumb drainage. PMH: ESRD on HD, DM2, CHF, CAD, HLD, HTN, arthritis, brittle bones, charcot ankle, kidney stones, macular degeneration    Assessment / Plan / Recommendation  Clinical Impression  Pt seen at bedside for assessment of swallow function and safety. [name] was present. Wife reports pt had esophageal surgery for repair of a tear last year. She also reports he has been diagnosed with reflux, but has not been taking a PPI.  She reports phlegm frequently, which pt cannot get up. Pt presents with adequate natural dentition. CN exam unremarkable. Strong volitional cough, clear voice quality. Pt accepted trials of thin liquid, puree, and solid textures. Timely oral prep and clearing noted. No overt s/s aspiration on solid trials, or when challenged to drink 3oz water [employer]). Pt did not exhibit burping during this assessment, however, given history of GERD, esophageal surgery last year, and presentation at bedside, a primary esophageal dysphagia is suspected. Recommend  consideration of a regular barium swallow (Esophgram) to assess esophageal motility. SLP will follow up for education with pt/wife once results are available. RN and MD informed.  SLP Visit Diagnosis: Dysphagia, unspecified (R13.10)    Aspiration Risk  Mild aspiration risk    Diet Recommendation Regular;Thin liquid   Liquid Administration via: Cup;Straw Medication Administration: Whole meds with liquid Supervision: Patient able to self feed Compensations: Slow rate;Small sips/bites;Minimize environmental distractions Postural Changes: Seated upright at 90 degrees;Remain upright for at least 30 minutes after po intake    Other  Recommendations Recommended Consults: Consider esophageal assessment Oral Care Recommendations: Oral care BID    Recommendations for follow up therapy are one component of a multi-disciplinary discharge planning process, led by the attending physician.  Recommendations may be updated based on patient status, additional functional criteria and insurance authorization.  Follow up Recommendations No SLP follow up      Assistance Recommended at Discharge None  Functional Status Assessment Patient has not had a recent decline in their functional status  Frequency and Duration min 1 x/week  1 week       Prognosis Prognosis for Safe Diet Advancement: Good      Swallow Study   General Date of Onset: 08/28/22 HPI: 76yo male admitted 08/28/22 with thumb drainage. PMH: ESRD on HD, DM2, CHF, CAD, HLD, HTN, arthritis, brittle bones, charcot ankle, kidney stones, macular degeneration Type of Study: Bedside Swallow Evaluation Previous Swallow Assessment: none Diet Prior to this Study: Regular;Thin liquids Temperature Spikes Noted: No History of Recent Intubation: No Behavior/Cognition: Alert;Cooperative;Pleasant mood Oral Cavity Assessment: Within Functional Limits Oral Care Completed by SLP: No Oral Cavity - Dentition: Adequate natural dentition Vision:  Functional for self-feeding Self-Feeding Abilities: Able to feed self Patient Positioning: Upright in chair Baseline Vocal Quality: Normal Volitional Cough: Strong Volitional Swallow: Able to elicit    Oral/Motor/Sensory Function Overall Oral Motor/Sensory Function: Within functional limits   Ice Chips Ice chips: Not tested   Thin Liquid Thin Liquid: Within functional limits Presentation: Straw;Cup    Nectar Thick Nectar Thick Liquid: Not tested   Honey Thick Honey Thick Liquid: Not tested   Puree Puree: Within functional limits Presentation: Spoon   Solid     Solid: Within functional limits Presentation: [place] B. Quentin Ore, [facility], [place] Speech Language Pathologist Office: [phone]  Shonna Chock 09/01/2022,10:46 AM

## 2022-09-01 NOTE — Progress Notes (Addendum)
PROGRESS NOTE    Louis [name]  LPF:[id number] DOB: Apr 14, 1946 DOA: 08/28/2022 PCP: [name], MD   Brief Narrative:  HPI: Louis [name] is a 76 y.o. with history of ESRD on hemodialysis, type 2 diabetes, CHF, CAD, hyperlipidemia, hypertension presented to the ER at the recommendation of his failure medicine physician.  Patient had left hand swelling abdominal swelling for the last 2 days.  He has noticed bloody drainage starting yesterday so he presented to the ER.  He has had wounds on his hands for a year however never to this severity.  In the ER he had x-rays obtained which demonstrated extensive soft tissue swelling of the left thumb concern for osteomyelitis.  Other labs were obtained which showed CBC WBC 16.5, hemoglobin 10.7, sodium 131, potassium 4.7, creatinine 6.7, lactic acid 1.5, magnesium 3.  Hand surgery was consulted in the ED the patient was admitted for IV antibiotics.  He is allergic to vancomycin so he was given linezolid.  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  Patient underwent left upper arm AV fistula ligation and placement of left IJ TDC by vascular surgery on 08/29/2022.  He then underwent Left thumb flexor sheath incision and drainage by Dr. Caralyn Guile on 08/30/2022.  Plan for surgical procedure on the right hand on Monday, according to patient. Intraoperative cultures are already growing Staphylococcus aureus.  In addition to gram-positive cocci gram-negative rod was seen on Gram  stain.  ID on board, currently patient  daptomycin per ID.   Vascular surgery also saw him and per them, he has good blood flow so no angiogram is needed.  ESRD on HD: Nephrology on board.  Hyperlipidemia: Continue Crestor.  Type 2 diabetes mellitus: Blood sugar labile.  Continue Lantus 20 units and SSI.  Acute hyponatremia: We will defer to nephrology since he is a dialysis patient.  Permanent atrial fibrillation: Rates controlled.  He is not on any rate control medications.  Was on Eliquis PTA.  Since he is scheduled for amputation on Monday, will start on heparin for now.  Dysphagia: Patient was noted to have some aspiration event yesterday with the nurses.  SLP has been consulted and he has been evaluated and per them, he likely has esophageal dysphagia and recommended esophagogram which I have ordered.  Chest x-ray shows persistent left lower lobe infiltrate or pleural effusion but patient does not have any symptoms for pneumonia.  He is already on antibiotics.  DVT prophylaxis: heparin injection 5,000 Units Start: 08/31/22 1400 SCD's Start: 08/30/22 1455 SCDs Start: 08/29/22 0549 we will start him on heparin.   Code Status: Full Code  Family Communication: none present at bedside.  Plan of care discussed with patient in length and he/she verbalized understanding and agreed with it.  Status is: Inpatient Remains inpatient appropriate because: Needs another surgery in the right hand which is planned next week.     Estimated body mass index is 35.5 kg/m as calculated from the following:   Height as of this  encounter: '5\' 7"'$  (1.702 m).   Weight as of this encounter: 102.8 kg.    Nutritional Assessment: Body mass index is 35.5 kg/m.Marland Kitchen Seen by dietician.  I agree with the assessment and plan as outlined below: Nutrition Status:        . Skin Assessment: I have examined the patient's skin and I agree with the wound assessment as performed by the wound care RN as outlined  below:    Consultants:  Ortho  ID Vascular surgery Nephrology  Procedures:  As above  Antimicrobials:  Anti-infectives (From admission, onward)    Start     Dose/Rate Route Frequency Ordered Stop   09/01/22 2000  DAPTOmycin (CUBICIN) 650 mg in sodium chloride 0.9 % IVPB        8 mg/kg  81.9 kg (Adjusted) 126 mL/hr over 30 Minutes Intravenous Every 48 hours 08/31/22 0738     08/31/22 2200  DAPTOmycin (CUBICIN) 650 mg in sodium chloride 0.9 % IVPB  Status:  Discontinued        6 mg/kg  105.7 kg 126 mL/hr over 30 Minutes Intravenous Every 48 hours 08/30/22 1822 08/31/22 0738   08/30/22 1915  DAPTOmycin (CUBICIN) 650 mg in sodium chloride 0.9 % IVPB        6 mg/kg  105.7 kg 126 mL/hr over 30 Minutes Intravenous  Once 08/30/22 1822 08/30/22 2126   08/30/22 1800  cefTRIAXone (ROCEPHIN) 2 g in sodium chloride 0.9 % 100 mL IVPB  Status:  Discontinued        2 g 200 mL/hr over 30 Minutes Intravenous Every 24 hours 08/30/22 1018 09/01/22 0958   08/29/22 1500  ceFAZolin (ANCEF) IVPB 2g/100 mL premix  Status:  Discontinued        2 g 200 mL/hr over 30 Minutes Intravenous On call to O.R. 08/29/22 1454 08/29/22 1705   08/29/22 1430  ceFAZolin (ANCEF) IVPB 2g/100 mL premix        2 g 200 mL/hr over 30 Minutes Intravenous  Once 08/29/22 1418 08/29/22 1456   08/29/22 1417  ceFAZolin (ANCEF) 2-4 GM/100ML-% IVPB       Note to Pharmacy: Cameron Sprang M: cabinet override      08/29/22 1417 08/29/22 1524   08/29/22 1400  piperacillin-tazobactam (ZOSYN) IVPB 2.25 g  Status:  Discontinued        2.25 g 100 mL/hr over 30 Minutes Intravenous Every 8 hours 08/29/22 0501 08/30/22 1018   08/29/22 0500  linezolid (ZYVOX) IVPB 600 mg  Status:  Discontinued        600 mg 300 mL/hr over 60 Minutes Intravenous Every 12 hours 08/29/22 0458 08/30/22 1738   08/29/22 0500  piperacillin-tazobactam (ZOSYN) IVPB 3.375 g        3.375 g 100 mL/hr over 30 Minutes Intravenous  Once 08/29/22 0459 08/29/22 0720          Subjective:  Patient seen and examined.  Wife at the bedside.  Patient states that he feels much better.  He is fully alert and oriented today.  Denied any shortness of breath or any other complaint.  Objective: Vitals:   08/31/22 1309 08/31/22 1657 08/31/22 2030 09/01/22 0328  BP: 125/78 (!) 93/59 106/79 118/64  Pulse: 66 84 72 (!) 58  Resp:   18 18  Temp: 97.6 F (36.4 C) 98.5 F (36.9 C) 98.3 F (36.8 C) 98 F (36.7 C)  TempSrc: Oral Oral Oral Oral  SpO2: 96% 100% 100%   Weight:  Height:        Intake/Output Summary (Last 24 hours) at 09/01/2022 1058 Last data filed at 09/01/2022 1001 Gross per 24 hour  Intake 560 ml  Output 4000 ml  Net -3440 ml    Filed Weights   08/30/22 0307 08/31/22 0744 08/31/22 1222  Weight: 105.7 kg 106.8 kg 102.8 kg    Examination:  General exam: Appears calm and comfortable  Respiratory system: Clear to auscultation. Respiratory effort normal. Cardiovascular system: S1 & S2 heard, RRR. No JVD, murmurs, rubs, gallops or clicks. No pedal edema. Gastrointestinal system: Abdomen is nondistended, soft and nontender. No organomegaly or masses felt. Normal bowel sounds heard. Central nervous system: Alert and oriented. No focal neurological deficits. Extremities: Dressing in the left thumb.  Ulcerated fingers both hands. Psychiatry: Judgement and insight appear normal. Mood & affect appropriate.   Data Reviewed: I have personally reviewed following labs and imaging studies  CBC: Recent Labs  Lab 08/28/22 1630 08/28/22 2323 08/29/22 1420 08/30/22 0817 08/31/22 0800  WBC 16.5* 16.3*  --  12.6* 13.1*  NEUTROABS 14,421* 14.4*  --   --   --   HGB 10.7* 10.4* 11.9* 10.2* 9.6*  HCT 33.9* 33.3* 35.0* 33.4* 30.5*  MCV 93.1 95.7  --  95.7 95.9  PLT 217 215  --  211 846    Basic Metabolic Panel: Recent Labs  Lab 08/28/22 2323 08/29/22 1420 08/30/22 0816 08/30/22 0817 08/31/22 0800  NA 131* 127*  --  133* 128*  K 4.7  5.0  --  4.3 4.8  CL 90* 90*  --  90* 88*  CO2 27  --   --  27 25  GLUCOSE 137* 102*  --  74 208*  BUN 68* 74*  --  42* 64*  CREATININE 6.76* 7.40*  --  4.67* 5.62*  CALCIUM 8.6*  --   --  8.7* 8.7*  MG 3.0*  --   --   --   --   PHOS  --   --  4.9*  --  7.4*    GFR: Estimated Creatinine Clearance: 12.8 mL/min (A) (by C-G formula based on SCr of 5.62 mg/dL (H)). Liver Function Tests: Recent Labs  Lab 08/28/22 2323 08/31/22 0800  AST 26  --   ALT 23  --   ALKPHOS 248*  --   BILITOT 1.0  --   PROT 6.5  --   ALBUMIN 2.4* 2.0*    No results for input(s): "LIPASE", "AMYLASE" in the last 168 hours. No results for input(s): "AMMONIA" in the last 168 hours. Coagulation Profile: No results for input(s): "INR", "PROTIME" in the last 168 hours. Cardiac Enzymes: Recent Labs  Lab 08/31/22 0456  CKTOTAL 68    BNP (last 3 results) No results for input(s): "PROBNP" in the last 8760 hours. HbA1C: No results for input(s): "HGBA1C" in the last 72 hours. CBG: Recent Labs  Lab 08/30/22 2017 08/31/22 1259 08/31/22 1659 08/31/22 2055 09/01/22 0811  GLUCAP 189* 128* 169* 157* 96    Lipid Profile: No results for input(s): "CHOL", "HDL", "LDLCALC", "TRIG", "CHOLHDL", "LDLDIRECT" in the last 72 hours. Thyroid Function Tests: No results for input(s): "TSH", "T4TOTAL", "FREET4", "T3FREE", "THYROIDAB" in the last 72 hours. Anemia Panel: No results for input(s): "VITAMINB12", "FOLATE", "FERRITIN", "TIBC", "IRON", "RETICCTPCT" in the last 72 hours. Sepsis Labs: Recent Labs  Lab 08/28/22 2323 08/29/22 0539  LATICACIDVEN 1.5 2.0*     Recent Results (from the past 240 hour(s))  WOUND CULTURE  Status: None (Preliminary result)   Collection Time: 08/28/22  4:29 PM   Specimen: Thumb; Wound  Result Value Ref Range Status   MICRO NUMBER: [id number]  Preliminary   SPECIMEN QUALITY: Adequate  Preliminary   SOURCE: WOUND (SITE NOT SPECIFIED)  Preliminary   STATUS: PRELIMINARY   Preliminary   GRAM STAIN:   Preliminary    No white blood cells seen No epithelial cells seen Many Gram positive cocci in clusters  Blood culture (routine x 2)     Status: None (Preliminary result)   Collection Time: 08/28/22 11:30 PM   Specimen: BLOOD RIGHT HAND  Result Value Ref Range Status   Specimen Description BLOOD RIGHT HAND  Final   Special Requests   Final    BOTTLES DRAWN AEROBIC AND ANAEROBIC Blood Culture adequate volume   Culture   Final    NO GROWTH 3 DAYS Performed at [facility], [address] [place], [place] [address]    Report Status PENDING  Incomplete  Blood culture (routine x 2)     Status: None (Preliminary result)   Collection Time: 08/29/22  5:40 AM   Specimen: BLOOD RIGHT FOREARM  Result Value Ref Range Status   Specimen Description BLOOD RIGHT FOREARM  Final   Special Requests   Final    BOTTLES DRAWN AEROBIC AND ANAEROBIC Blood Culture results may not be optimal due to an inadequate volume of blood received in culture bottles   Culture   Final    NO GROWTH 2 DAYS Performed at [facility], [place] [address] [place], [place] [address]    Report Status PENDING  Incomplete  MRSA Next Gen by PCR, Nasal     Status: Abnormal   Collection Time: 08/30/22  3:41 AM   Specimen: Nasal Mucosa; Nasal Swab  Result Value Ref Range Status   MRSA by PCR Next Gen DETECTED (A) NOT DETECTED Final    Comment: CRITICAL RESULT CALLED TO, READ BACK BY AND VERIFIED WITH:  C/ ERIC N., RN 08/30/22 0548 A. LAFRANCE (NOTE) The GeneXpert MRSA Assay (FDA approved for NASAL specimens only), is one component of a comprehensive MRSA colonization surveillance program. It is not intended to diagnose MRSA infection nor to guide or monitor treatment for MRSA infections. Test performance is not FDA approved in patients less than 87 years old. Performed at [facility], [place] [address], [place], [place] [address]   Aerobic/Anaerobic Culture w Gram Stain (surgical/deep  wound)     Status: None (Preliminary result)   Collection Time: 08/30/22  1:43 PM   Specimen: PATH Other; Body Fluid  Result Value Ref Range Status   Specimen Description WOUND  Final   Special Requests LEFT THUMB ABSCESS SPEC A  Final   Gram Stain   Final    RARE WBC PRESENT, PREDOMINANTLY MONONUCLEAR FEW GRAM POSITIVE COCCI IN PAIRS IN CLUSTERS RARE GRAM NEGATIVE RODS    Culture   Final    MODERATE METHICILLIN RESISTANT STAPHYLOCOCCUS AUREUS CULTURE REINCUBATED FOR BETTER GROWTH Performed at [facility], [place] [address], [place],  [address]    Report Status PENDING  Incomplete   Organism ID, Bacteria METHICILLIN RESISTANT STAPHYLOCOCCUS AUREUS  Final      Susceptibility   Methicillin resistant staphylococcus aureus - MIC*    CIPROFLOXACIN 4 RESISTANT Resistant     ERYTHROMYCIN >=8 RESISTANT Resistant     GENTAMICIN <=0.5 SENSITIVE Sensitive     OXACILLIN >=4 RESISTANT Resistant  TETRACYCLINE <=1 SENSITIVE Sensitive     VANCOMYCIN 1 SENSITIVE Sensitive     TRIMETH/SULFA <=10 SENSITIVE Sensitive     CLINDAMYCIN <=0.25 SENSITIVE Sensitive     RIFAMPIN <=0.5 SENSITIVE Sensitive     Inducible Clindamycin NEGATIVE Sensitive     * MODERATE METHICILLIN RESISTANT STAPHYLOCOCCUS AUREUS  Aerobic/Anaerobic Culture w Gram Stain (surgical/deep wound)     Status: None (Preliminary result)   Collection Time: 08/30/22  1:51 PM   Specimen: PATH Other; Tissue  Result Value Ref Range Status   Specimen Description TISSUE  Final   Special Requests LEFT THUMB BONE  Final   Gram Stain   Final    NO WBC SEEN RARE GRAM POSITIVE COCCI IN CLUSTERS    Culture   Final    MODERATE STAPHYLOCOCCUS AUREUS CULTURE REINCUBATED FOR BETTER GROWTH Performed at [facility], [address] [place], [place] [address]    Report Status PENDING  Incomplete     Radiology Studies: VAS [place] UPPER EXTREMITY ARTERIAL DUPLEX  Result Date: 08/31/2022  UPPER EXTREMITY DUPLEX STUDY Patient  Name:  Louis [name]  Date of Exam:   08/31/2022 Medical Rec #: [id number]        Accession #:    [id number] Date of Birth: 1946-04-26        Patient Gender: M Patient Age:   62 years Exam Location:  [facility] Procedure:      VAS [place] UPPER EXTREMITY ARTERIAL DUPLEX Referring Phys: Harrell Gave DICKSON --------------------------------------------------------------------------------  Indications: Bilateral pain, discoloration LT hand > RT. History:     Patient has a history of post left AVF ligation.  Limitations: Bandaging bilaterally Comparison Study: 08-13-2020 Prior upper extremity arterial limited LEFT                   pre-access Performing Technologist: Darlin Coco RDMS, RVT  Examination Guidelines: A complete evaluation includes B-mode imaging, spectral Doppler, color Doppler, and power Doppler as needed of all accessible portions of each vessel. Bilateral testing is considered an integral part of a complete examination. Limited examinations for reoccurring indications may be performed as noted.  Right Doppler Findings: +---------------+----------+-----------+--------+-------------+ Site           PSV (cm/s)Waveform   StenosisComments      +---------------+----------+-----------+--------+-------------+ Subclavian Dist          triphasic                        +---------------+----------+-----------+--------+-------------+ Brachial Prox  68        triphasic          0.59 diameter +---------------+----------+-----------+--------+-------------+ Brachial Mid   90        triphasic          0.44 diameter +---------------+----------+-----------+--------+-------------+ Brachial Dist  75        triphasic          0.50 diameter +---------------+----------+-----------+--------+-------------+ Radial Prox    61        triphasic          0.20 diameter +---------------+----------+-----------+--------+-------------+ Radial Mid     64        triphasic          0.19 diameter  +---------------+----------+-----------+--------+-------------+ Radial Dist    67        triphasic          0.20 diameter +---------------+----------+-----------+--------+-------------+ Ulnar Prox     62        triphasic  0.28 diameter +---------------+----------+-----------+--------+-------------+ Ulnar Mid      80        triphasic          0.20 diameter +---------------+----------+-----------+--------+-------------+ Ulnar Dist     105       triphasic          0.22 diameter +---------------+----------+-----------+--------+-------------+ Palmar Arch    41        multiphasic                      +---------------+----------+-----------+--------+-------------+   Left Doppler Findings: +---------------+----------+-----------+--------+--------+ Site           PSV (cm/s)Waveform   StenosisComments +---------------+----------+-----------+--------+--------+ Subclavian Prox68        triphasic                   +---------------+----------+-----------+--------+--------+ Subclavian Mid 71        triphasic                   +---------------+----------+-----------+--------+--------+ Subclavian Dist65        triphasic                   +---------------+----------+-----------+--------+--------+ Axillary       89        triphasic                   +---------------+----------+-----------+--------+--------+ Brachial Prox  67        triphasic                   +---------------+----------+-----------+--------+--------+ Brachial Mid   91        triphasic                   +---------------+----------+-----------+--------+--------+ Brachial Dist  71        triphasic                   +---------------+----------+-----------+--------+--------+ Radial Prox    113       triphasic                   +---------------+----------+-----------+--------+--------+ Radial Mid     95        triphasic                    +---------------+----------+-----------+--------+--------+ Radial Dist    84        triphasic                   +---------------+----------+-----------+--------+--------+ Ulnar Prox     69        triphasic                   +---------------+----------+-----------+--------+--------+ Ulnar Mid      85        triphasic                   +---------------+----------+-----------+--------+--------+ Ulnar Dist     92        triphasic                   +---------------+----------+-----------+--------+--------+ Palmar Arch    37        multiphasic                 +---------------+----------+-----------+--------+--------+   Summary:  Right: No obstruction visualized in the right upper extremity. Left: No obstruction visualized in the left upper extremity. *See table(s) above for measurements and observations. Electronically signed by Vonna Kotyk  Robins on 08/31/2022 at 8:12:23 PM.    Final    DG CHEST PORT 1 VIEW  Result Date: 08/31/2022 CLINICAL DATA:  Aspiration pneumonia. EXAM: PORTABLE CHEST 1 VIEW COMPARISON:  08/29/2022 FINDINGS: The left IJ dialysis catheter is stable. Stable borderline cardiac enlargement. Persistent left lower lobe infiltrate and left pleural effusion. IMPRESSION: Persistent left lower lobe infiltrate and left pleural effusion. Electronically Signed   By: Marijo Sanes M.D.   On: 08/31/2022 15:32    Scheduled Meds:  Chlorhexidine Gluconate Cloth  6 each Topical Q0600   Chlorhexidine Gluconate Cloth  6 each Topical Q0600   Chlorhexidine Gluconate Cloth  6 each Topical Q0600   darbepoetin (ARANESP) injection - DIALYSIS  60 mcg Intravenous Q Thu-HD   heparin injection (subcutaneous)  5,000 Units Subcutaneous Q8H   insulin aspart  0-24 Units Subcutaneous TID WC   insulin aspart  4 Units Subcutaneous TID WC   insulin detemir  20 Units Subcutaneous Daily   midodrine  10 mg Oral Q T,Th,Sa-HD   mupirocin ointment  1 Application Nasal BID   rosuvastatin  10 mg Oral  Daily   sevelamer carbonate  1,600 mg Oral TID WC   Continuous Infusions:  DAPTOmycin (CUBICIN) 650 mg in sodium chloride 0.9 % IVPB       LOS: 2 days   Darliss Cheney, MD [facility]  09/01/2022, 10:58 AM   *Please note that this is a verbal dictation therefore any spelling or grammatical errors are due to the "[place] [facility]" system interpretation.  Please page via [place] and do not message via secure chat for urgent patient care matters. Secure chat can be used for non urgent patient care matters.  How to contact the [facility] Attending or Consulting provider [place] or covering provider during after hours [place], for this patient?  Check the care team in [facility] and look for a) attending/consulting [facility] provider listed and b) the [facility] team listed. Page or secure chat [phone]. Log into [email or web address] and use [place]'s universal password to access. If you do not have the password, please contact the hospital operator. Locate the [facility] provider you are looking for under [facility] and page to a number that you can be directly reached. If you still have difficulty reaching the provider, please page the [facility] (Director on Call) for the Hospitalists listed on amion for assistance.

## 2022-09-01 NOTE — Care Management Important Message (Signed)
Important Message  Patient Details  Name: Louis [name] MRN: [id number] Date of Birth: December 18, 1945   Medicare Important Message Given:  Yes     Orbie Pyo 09/01/2022, 3:09 PM

## 2022-09-01 NOTE — Evaluation (Signed)
Physical Therapy Evaluation Patient Details Name: Louis [name] MRN: [id number] DOB: [birth year] Today's Date: 09/01/2022  History of Present Illness  Patient is a 76 y/o male who presents on 10/9 with drainage, pain and swelling of left hand/thumb. 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 Plan for further I&D 10/16 and possible amputation. PMH includes CAD,. CHF, DM, ESRD on HD, HTN, paroxysmal A-fib, macular degeneration.  Clinical Impression  Patient presents with pain, generalized weakness, neuropathy BUEs/LEs, cognitive deficits, impaired balance and impaired mobility s/p above. Pt lives at home with his wife and needs assist with ADLs/IADLs and walking short distances with RW at baseline. Pt uses w/c for later in afternoon when fatigued due to knees buckling and weakness. Today, pt requires Max A for bed mobility. Min A of 2 for standing and Mod A of 2 for taking a few steps in room due to posterior lean, weakness and poor balance. Pt and wife really eager to return home and not have to go to rehab at d/c so will try to maximize therapy services. Will consult mobility techs. If pt does not improve enough for wife to care for pt at home, may need to consider SNF. Will follow acutely to maximize independence and mobility prior to return home.       Recommendations for follow up therapy are one component of a multi-disciplinary discharge planning process, led by the attending physician.  Recommendations may be updated based on patient status, additional functional criteria and insurance authorization.  Follow Up Recommendations Home health PT (hoping for HHPT, may need SNF pending progress)      Assistance Recommended at Discharge Frequent or constant Supervision/Assistance  Patient can return home with the following  Two people to help with walking and/or transfers;A lot of help with  bathing/dressing/bathroom;Assistance with cooking/housework;Assist for transportation;Assistance with feeding    Equipment Recommendations None recommended by PT  Recommendations for Other Services       Functional Status Assessment Patient has had a recent decline in their functional status and demonstrates the ability to make significant improvements in function in a reasonable and predictable amount of time.     Precautions / Restrictions Precautions Precautions: Fall Restrictions Weight Bearing Restrictions: No      Mobility  Bed Mobility Overal bed mobility: Needs Assistance Bed Mobility: Supine to Sit     Supine to sit: Max assist, HOB elevated     General bed mobility comments: Increased time, assist with LEs, trunk ands cooting bottom to EOB.    Transfers Overall transfer level: Needs assistance Equipment used: Rolling walker (2 wheels) Transfers: Sit to/from Stand, Bed to chair/wheelchair/BSC Sit to Stand: Min assist, +2 physical assistance, From elevated surface   Step pivot transfers: Mod assist, +2 safety/equipment       General transfer comment: Stood from EOB x1 with cues for hand placement/technique, transferred to chair. Able to take a few steps to get to chair with Mod A of 2 for safety due to posterior lean, cues for RW proximity.    Ambulation/Gait                  Stairs            Wheelchair Mobility    Modified Rankin (Stroke Patients Only)       Balance Overall balance assessment: Needs assistance Sitting-balance support: Feet supported, No upper extremity supported Sitting balance-Leahy Scale: Fair  Sitting balance - Comments: supervision for safety.   Standing balance support: During functional activity, Reliant on assistive device for balance, Bilateral upper extremity supported Standing balance-Leahy Scale: Poor Standing balance comment: Requires UE support and external support for standing balance                              Pertinent Vitals/Pain Pain Assessment Pain Assessment: Faces Faces Pain Scale: Hurts even more Pain Location: Bil hands and back Pain Descriptors / Indicators: Sore, Discomfort, Operative site guarding Pain Intervention(s): Monitored during session, Repositioned, Limited activity within patient's tolerance    Home Living Family/patient expects to be discharged to:: Private residence Living Arrangements: Spouse/significant other Available Help at Discharge: Family Type of Home: House Home Access: Stairs to enter Entrance Stairs-Rails: Right;Left;Can reach both Technical brewer of Steps: 2   Home Layout: One level Home Equipment: Shower seat - built Medical sales representative (2 wheels);Cane - single point;BSC/3in1;Wheelchair - manual      Prior Function Prior Level of Function : Needs assist       Physical Assist : Mobility (physical);ADLs (physical) Mobility (physical): Gait;Transfers ADLs (physical): Bathing;Dressing;IADLs Mobility Comments: Uses RW for ambulation, uses w/c later in the day. Does not drive. no falls reported ADLs Comments: Wife assists with ADls, IADLs.     Hand Dominance   Dominant Hand: Right    Extremity/Trunk Assessment   Upper Extremity Assessment Upper Extremity Assessment: Defer to OT evaluation;LUE deficits/detail LUE Deficits / Details: Swelling left digits    Lower Extremity Assessment Lower Extremity Assessment: Generalized weakness;RLE deficits/detail;LLE deficits/detail RLE Deficits / Details: decreased sensation distal to knee RLE Sensation: history of peripheral neuropathy;decreased proprioception LLE Deficits / Details: decreased sensation distal to knee LLE Sensation: history of peripheral neuropathy;decreased proprioception       Communication   Communication: No difficulties  Cognition Arousal/Alertness: Awake/alert Behavior During Therapy: Flat affect Overall Cognitive Status: Impaired/Different from  baseline Area of Impairment: Problem solving                             Problem Solving: Slow processing, Decreased initiation, Difficulty sequencing, Requires verbal cues, Requires tactile cues General Comments: Slow processing, flat affect.        General Comments General comments (skin integrity, edema, etc.): Wife present during session and assisted providing PLOF/history. Wife and pt would really like to work towards home and not go to rehab if possible, asking for increased therapies here.    Exercises     Assessment/Plan    PT Assessment Patient needs continued PT services  PT Problem List Decreased strength;Decreased mobility;Obesity;Decreased skin integrity;Decreased cognition;Decreased balance;Pain;Impaired sensation       PT Treatment Interventions Therapeutic activities;DME instruction;Gait training;Therapeutic exercise;Patient/family education;Balance training;Functional mobility training    PT Goals (Current goals can be found in the Care Plan section)  Acute Rehab PT Goals Patient Stated Goal: to be able to go home after this PT Goal Formulation: With patient/family Time For Goal Achievement: 09/15/22 Potential to Achieve Goals: Good    Frequency Min 3X/week     Co-evaluation               AM-PAC PT "6 Clicks" Mobility  Outcome Measure Help needed turning from your back to your side while in a flat bed without using bedrails?: A Lot Help needed moving from lying on your back to sitting on the side of a flat bed without  using bedrails?: Total Help needed moving to and from a bed to a chair (including a wheelchair)?: A Lot Help needed standing up from a chair using your arms (e.g., wheelchair or bedside chair)?: A Lot Help needed to walk in hospital room?: Total Help needed climbing 3-5 steps with a railing? : Total 6 Click Score: 9    End of Session Equipment Utilized During Treatment: Gait belt Activity Tolerance: Patient tolerated  treatment well Patient left: in chair;with call bell/phone within reach;with chair alarm set;with family/visitor present Nurse Communication: Mobility status PT Visit Diagnosis: Pain;Muscle weakness (generalized) (M62.81);Difficulty in walking, not elsewhere classified (R26.2);Unsteadiness on feet (R26.81) Pain - Right/Left:  (bil) Pain - part of body: Hand    Time: 2505-3976 PT Time Calculation (min) (ACUTE ONLY): 28 min   Charges:   PT Evaluation $PT Eval Moderate Complexity: 1 Mod PT Treatments $Therapeutic Activity: 8-22 mins        Marisa Severin, PT, DPT Acute Rehabilitation Services Secure chat preferred Office [place] 09/01/2022, 9:00 AM

## 2022-09-01 NOTE — Progress Notes (Signed)
Subjective: Sitting up in chair participating with PT, tolerated 4 L UF dialysis yest.  Denies shortness of breath feels can wait till tomorrow today for dialysis.  Objective Vital signs in last 24 hours: Vitals:   08/31/22 1309 08/31/22 1657 08/31/22 2030 09/01/22 0328  BP: 125/78 (!) 93/59 106/79 118/64  Pulse: 66 84 72 (!) 58  Resp:   18 18  Temp: 97.6 F (36.4 C) 98.5 F (36.9 C) 98.3 F (36.8 C) 98 F (36.7 C)  TempSrc: Oral Oral Oral Oral  SpO2: 96% 100% 100%   Weight:      Height:       Weight change:   Physical Exam: General: Alert chronically ill-appearing male, pleasant, NAD Heart: RRR no MRG Lungs: CTA unlabored breathing on room air 100 percent O2 sat Abdomen: NABS, soft NTND Extremities: 1+ edema thighs to trace to 1+ ankle Dialysis Access: Left subclavian TDC,L arm AV fistula no bruit status post ligation  OP dialysis Orders: TTS SW 4h 450/ 500  101 kg  3K/2.5 bath  Hep none LUA AVF - last HD post HD 101.6kg on 10/7 - last Hb 10.0 10/5 - mircera 50 q 2, last 9/26 - venofer 100 mg tiw IV thru 10/19 - doxercalciferol 5 ug ttw IV   Home meds include - eliquis, furosemide 40 qd, isordil 20, midodrine 10 prn, novolin insulin, rosuvastatin, vits/ supps / prns Problem/Plan: ESRD= HD TTS on schedule Volume overload= midodrine 10 mg TTS predialysis ordered recently and tolerating UF, 4 L UF yesterday and tolerated, next HD tomorrow, attempt 4 L UF, hyponatremia yest, Na 128 Pre HD , improved with UF hd, follow-up lab trend Osteomyelitis left hand/thumb status post left arm AV fistula ligation/ left thumb flexor sheath I&D , bone curettage and debridement of osteomyelitis of the distal phalanx by Dr. Apolonio Schneiders (Hand ortho)= on Rocephin and daptomycin per admit team DM2= plan per admit team Anemia yesterday-Hgb 9.6 (10/12 )Aranesp 60 q. Thursday weekly/no iron with infection Secondary hyperparathyroidism -phosphorus 7.4, C ca  in goal Renvela 800 increased 1600 mg, needs  carb modified renal diet change this a.m.   Louis Haber, Louis [name] [facility] Beeper [phone] 09/01/2022,8:57 AM  LOS: 2 days   Labs: Basic Metabolic Panel: Recent Labs  Lab 08/28/22 2323 08/29/22 1420 08/30/22 0816 08/30/22 0817 08/31/22 0800  NA 131* 127*  --  133* 128*  K 4.7 5.0  --  4.3 4.8  CL 90* 90*  --  90* 88*  CO2 27  --   --  27 25  GLUCOSE 137* 102*  --  74 208*  BUN 68* 74*  --  42* 64*  CREATININE 6.76* 7.40*  --  4.67* 5.62*  CALCIUM 8.6*  --   --  8.7* 8.7*  PHOS  --   --  4.9*  --  7.4*   Liver Function Tests: Recent Labs  Lab 08/28/22 2323 08/31/22 0800  AST 26  --   ALT 23  --   ALKPHOS 248*  --   BILITOT 1.0  --   PROT 6.5  --   ALBUMIN 2.4* 2.0*   No results for input(s): "LIPASE", "AMYLASE" in the last 168 hours. No results for input(s): "AMMONIA" in the last 168 hours. CBC: Recent Labs  Lab 08/28/22 1630 08/28/22 2323 08/29/22 1420 08/30/22 0817 08/31/22 0800  WBC 16.5* 16.3*  --  12.6* 13.1*  NEUTROABS 14,421* 14.4*  --   --   --   HGB 10.7* 10.4* 11.9*  10.2* 9.6*  HCT 33.9* 33.3* 35.0* 33.4* 30.5*  MCV 93.1 95.7  --  95.7 95.9  PLT 217 215  --  211 206   Cardiac Enzymes: Recent Labs  Lab 08/31/22 0456  CKTOTAL 68   CBG: Recent Labs  Lab 08/30/22 2017 08/31/22 1259 08/31/22 1659 08/31/22 2055 09/01/22 0811  GLUCAP 189* 128* 169* 157* 96    Studies/Results: VAS [place] UPPER EXTREMITY ARTERIAL DUPLEX  Result Date: 08/31/2022  UPPER EXTREMITY DUPLEX STUDY Patient Name:  Louis [name]  Date of Exam:   08/31/2022 Medical Rec #: [id number]        Accession #:    [id number] Date of Birth: 01/14/1946        Patient Gender: M Patient Age:   76 years Exam Location:  [facility] Procedure:      VAS [place] UPPER EXTREMITY ARTERIAL DUPLEX Referring Phys: Harrell Gave DICKSON --------------------------------------------------------------------------------  Indications: Bilateral pain, discoloration LT hand > RT.  History:     Patient has a history of post left AVF ligation.  Limitations: Bandaging bilaterally Comparison Study: 08-13-2020 Prior upper extremity arterial limited LEFT                   pre-access Performing Technologist: Darlin Coco RDMS, RVT  Examination Guidelines: A complete evaluation includes B-mode imaging, spectral Doppler, color Doppler, and power Doppler as needed of all accessible portions of each vessel. Bilateral testing is considered an integral part of a complete examination. Limited examinations for reoccurring indications may be performed as noted.  Right Doppler Findings: +---------------+----------+-----------+--------+-------------+ Site           PSV (cm/s)Waveform   StenosisComments      +---------------+----------+-----------+--------+-------------+ Subclavian Dist          triphasic                        +---------------+----------+-----------+--------+-------------+ Brachial Prox  68        triphasic          0.59 diameter +---------------+----------+-----------+--------+-------------+ Brachial Mid   90        triphasic          0.44 diameter +---------------+----------+-----------+--------+-------------+ Brachial Dist  75        triphasic          0.50 diameter +---------------+----------+-----------+--------+-------------+ Radial Prox    61        triphasic          0.20 diameter +---------------+----------+-----------+--------+-------------+ Radial Mid     64        triphasic          0.19 diameter +---------------+----------+-----------+--------+-------------+ Radial Dist    67        triphasic          0.20 diameter +---------------+----------+-----------+--------+-------------+ Ulnar Prox     62        triphasic          0.28 diameter +---------------+----------+-----------+--------+-------------+ Ulnar Mid      80        triphasic          0.20 diameter +---------------+----------+-----------+--------+-------------+  Ulnar Dist     105       triphasic          0.22 diameter +---------------+----------+-----------+--------+-------------+ Palmar Arch    41        multiphasic                      +---------------+----------+-----------+--------+-------------+   Left  Doppler Findings: +---------------+----------+-----------+--------+--------+ Site           PSV (cm/s)Waveform   StenosisComments +---------------+----------+-----------+--------+--------+ Subclavian Prox68        triphasic                   +---------------+----------+-----------+--------+--------+ Subclavian Mid 71        triphasic                   +---------------+----------+-----------+--------+--------+ Subclavian Dist65        triphasic                   +---------------+----------+-----------+--------+--------+ Axillary       89        triphasic                   +---------------+----------+-----------+--------+--------+ Brachial Prox  67        triphasic                   +---------------+----------+-----------+--------+--------+ Brachial Mid   91        triphasic                   +---------------+----------+-----------+--------+--------+ Brachial Dist  71        triphasic                   +---------------+----------+-----------+--------+--------+ Radial Prox    113       triphasic                   +---------------+----------+-----------+--------+--------+ Radial Mid     95        triphasic                   +---------------+----------+-----------+--------+--------+ Radial Dist    84        triphasic                   +---------------+----------+-----------+--------+--------+ Ulnar Prox     69        triphasic                   +---------------+----------+-----------+--------+--------+ Ulnar Mid      85        triphasic                   +---------------+----------+-----------+--------+--------+ Ulnar Dist     92        triphasic                    +---------------+----------+-----------+--------+--------+ Palmar Arch    37        multiphasic                 +---------------+----------+-----------+--------+--------+   Summary:  Right: No obstruction visualized in the right upper extremity. Left: No obstruction visualized in the left upper extremity. *See table(s) above for measurements and observations. Electronically signed by Orlie Pollen on 08/31/2022 at 8:12:23 PM.    Final    DG CHEST PORT 1 VIEW  Result Date: 08/31/2022 CLINICAL DATA:  Aspiration pneumonia. EXAM: PORTABLE CHEST 1 VIEW COMPARISON:  08/29/2022 FINDINGS: The left IJ dialysis catheter is stable. Stable borderline cardiac enlargement. Persistent left lower lobe infiltrate and left pleural effusion. IMPRESSION: Persistent left lower lobe infiltrate and left pleural effusion. Electronically Signed   By: Marijo Sanes M.D.   On: 08/31/2022 15:32   Medications:  cefTRIAXone (ROCEPHIN)  IV Stopped (08/31/22 1810)   DAPTOmycin (CUBICIN) 650  mg in sodium chloride 0.9 % IVPB      Chlorhexidine Gluconate Cloth  6 each Topical Q0600   Chlorhexidine Gluconate Cloth  6 each Topical Q0600   darbepoetin (ARANESP) injection - DIALYSIS  60 mcg Intravenous Q Thu-HD   heparin injection (subcutaneous)  5,000 Units Subcutaneous Q8H   insulin aspart  0-24 Units Subcutaneous TID WC   insulin aspart  4 Units Subcutaneous TID WC   insulin detemir  20 Units Subcutaneous Daily   midodrine  10 mg Oral Q T,Th,Sa-HD   mupirocin ointment  1 Application Nasal BID   rosuvastatin  10 mg Oral Daily   sevelamer carbonate  800 mg Oral TID WC

## 2022-09-01 NOTE — Plan of Care (Signed)

## 2022-09-01 NOTE — Evaluation (Signed)
Occupational Therapy Evaluation Patient Details Name: Louis [name] MRN: [id number] DOB: [birth year] Today's Date: 09/01/2022   History of Present Illness Patient is a 76 y/o male who presents on 10/9 with drainage, pain and swelling of left hand/thumb. 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 Plan for further I&D 10/16 and possible amputation. PMH includes CAD,. CHF, DM, ESRD on HD, HTN, paroxysmal A-fib, macular degeneration.   Clinical Impression   Louis [name] presents to OT s/p L thumb I & D resulting in swelling and limited use, as well as generalized weakness limiting his ability to complete his ADLs and transfers at his PLOF. His wife Louis [name] provided a lot of assist with ADLs PTA but reports his transfers require much more assist than they previously did. Pt would benefit from continued acute OT services to facilitate safe d/c home and optimize occupational performance. At this time recommend SNF at d/c d/t needing max A with sit > stand and unable to transfer after PT session.        Recommendations for follow up therapy are one component of a multi-disciplinary discharge planning process, led by the attending physician.  Recommendations may be updated based on patient status, additional functional criteria and insurance authorization.   Follow Up Recommendations  Skilled nursing-short term rehab (<3 hours/day)    Assistance Recommended at Discharge Frequent or constant Supervision/Assistance  Patient can return home with the following A lot of help with walking and/or transfers;A lot of help with bathing/dressing/bathroom    Functional Status Assessment  Patient has had a recent decline in their functional status and demonstrates the ability to make significant improvements in function in a reasonable and predictable amount of time.  Equipment Recommendations  Wheelchair (measurements OT)  (20x20 transport chair)    Recommendations for Other Services       Precautions / Restrictions Precautions Precautions: Fall Restrictions Weight Bearing Restrictions: No      Mobility Bed Mobility Overal bed mobility: Needs Assistance Bed Mobility: Supine to Sit     Supine to sit: Max assist, HOB elevated     General bed mobility comments: Increased time, assist with LEs, trunk ands cooting bottom to EOB.    Transfers Overall transfer level: Needs assistance Equipment used: Rolling walker (2 wheels) Transfers: Sit to/from Stand, Bed to chair/wheelchair/BSC Sit to Stand: Mod assist     Step pivot transfers: Mod assist, +2 safety/equipment     General transfer comment: Per PT session, unable to pivot with OT      Balance Overall balance assessment: Needs assistance Sitting-balance support: Feet supported, No upper extremity supported Sitting balance-Leahy Scale: Fair Sitting balance - Comments: supervision for safety. Postural control: Posterior lean Standing balance support: During functional activity, Reliant on assistive device for balance, Bilateral upper extremity supported Standing balance-Leahy Scale: Poor Standing balance comment: Requires UE support and external support for standing balance                           ADL either performed or assessed with clinical judgement   ADL Overall ADL's : Needs assistance/impaired Eating/Feeding: Minimal assistance;Cueing for compensatory techinques   Grooming: Minimal assistance;Wash/dry face;Oral care;Wash/dry hands   Upper Body Bathing: Moderate assistance   Lower Body Bathing: Maximal assistance;Sit to/from stand   Upper Body Dressing : Maximal assistance;Sitting   Lower Body Dressing: Total assistance;Sit to/from stand   Toilet Transfer:  Maximal assistance;Stand-pivot   Toileting- Clothing Manipulation and Hygiene: Total assistance;Sit to/from stand       Functional mobility during ADLs:  Maximal assistance;Rolling walker (2 wheels) General ADL Comments: Unable to pivot with OT 2/2 fatigue from previous PT session. posterior bias requiring mod A to correct. Max A to pivot     Vision Baseline Vision/History: 3 Glaucoma Ability to See in Adequate Light: 1 Impaired Patient Visual Report: No change from baseline Vision Assessment?: No apparent visual deficits Additional Comments: Receives injections in his eyes monthly per wife     Perception     Praxis      Pertinent Vitals/Pain Pain Assessment Pain Assessment: 0-10 Pain Score: 7  Pain Location: all over Pain Descriptors / Indicators: Sore, Discomfort, Operative site guarding Pain Intervention(s): Monitored during session     Hand Dominance Right   Extremity/Trunk Assessment Upper Extremity Assessment Upper Extremity Assessment: RUE deficits/detail;LUE deficits/detail RUE Deficits / Details: R hand wounds chronic with poor sensation. Able to grasp functional items RUE Sensation: history of peripheral neuropathy RUE Coordination: decreased fine motor;decreased gross motor LUE Deficits / Details: Swelling left digits with thumb I &D LUE: Unable to fully assess due to immobilization LUE Sensation: history of peripheral neuropathy LUE Coordination: decreased fine motor   Lower Extremity Assessment Lower Extremity Assessment: Defer to PT evaluation RLE Deficits / Details: decreased sensation distal to knee RLE Sensation: history of peripheral neuropathy;decreased proprioception LLE Deficits / Details: decreased sensation distal to knee LLE Sensation: history of peripheral neuropathy;decreased proprioception   Cervical / Trunk Assessment Cervical / Trunk Assessment: Kyphotic   Communication Communication Communication: No difficulties   Cognition Arousal/Alertness: Awake/alert Behavior During Therapy: WFL for tasks assessed/performed Overall Cognitive Status: Impaired/Different from baseline Area of  Impairment: Problem solving                             Problem Solving: Slow processing, Decreased initiation, Difficulty sequencing, Requires verbal cues, Requires tactile cues General Comments: Slow processing, flat affect.     General Comments  Wife present and supportive during session    Exercises     Shoulder Instructions      Home Living Family/patient expects to be discharged to:: Private residence Living Arrangements: Spouse/significant other Available Help at Discharge: Family Type of Home: House Home Access: Ramped entrance;Stairs to enter Entrance Stairs-Number of Steps: 2 Entrance Stairs-Rails: Right;Left;Can reach both Louis [name]: One level     Bathroom Shower/Tub: Walk-in shower (no threshold)   Bathroom Toilet: Handicapped height     Home Equipment: [place] (2 wheels);Cane - single point;BSC/3in1;Wheelchair - manual          Prior Functioning/Environment Prior Level of Function : Needs assist       Physical Assist : Mobility (physical);ADLs (physical) Mobility (physical): Gait;Transfers ADLs (physical): Bathing;Dressing;IADLs Mobility Comments: Uses RW for ambulation, uses w/c later in the day. Does not drive. no falls reported ADLs Comments: Wife assists with ADls, IADLs.        OT Problem List: Decreased strength;Decreased activity tolerance;Impaired balance (sitting and/or standing);Decreased coordination;Decreased safety awareness;Decreased cognition;Pain;Impaired UE functional use;Decreased knowledge of use of DME or AE;Impaired sensation      OT Treatment/Interventions: Self-care/ADL training;Therapeutic exercise;Energy conservation;DME and/or AE instruction;Therapeutic activities;Splinting;Cognitive remediation/compensation;Patient/family education;Balance training    OT Goals(Current goals can be found in the care plan section) Acute Rehab OT Goals Patient Stated Goal: "get stronger to go  home" OT Goal Formulation: With  patient/family Time For Goal Achievement: 09/15/22 Potential to Achieve Goals: Good  OT Frequency: Min 2X/week    Co-evaluation              AM-PAC OT "6 Clicks" Daily Activity     Outcome Measure Help from another person eating meals?: A Little Help from another person taking care of personal grooming?: A Lot Help from another person toileting, which includes using toliet, bedpan, or urinal?: A Lot Help from another person bathing (including washing, rinsing, drying)?: A Lot Help from another person to put on and taking off regular upper body clothing?: A Lot Help from another person to put on and taking off regular lower body clothing?: A Lot 6 Click Score: 13   End of Session Equipment Utilized During Treatment: Gait belt;Rolling walker (2 wheels) Nurse Communication: Mobility status  Activity Tolerance: Patient limited by fatigue Patient left: in chair;with call bell/phone within reach;with chair alarm set;with family/visitor present  OT Visit Diagnosis: Unsteadiness on feet (R26.81);Muscle weakness (generalized) (M62.81)                Time: 9826-4158 OT Time Calculation (min): 32 min Charges:  OT General Charges $OT Visit: 1 Visit OT Evaluation $OT Eval Moderate Complexity: 1 Mod OT Treatments $Self Care/Home Management : 8-22 mins  Laverle Hobby, OTR/L, CBIS Acute Rehab Office: [phone]   Louis [name] 09/01/2022, 11:19 AM

## 2022-09-01 NOTE — Consult Note (Signed)
[facility] Nurse Consult Note: Reason for Consult:Blisters on hands and fingers Wound type:infectious, perfusion  Patient has been seen by Orthopedic hand specialist, Dr. Caralyn Guile as well as Vascular Surgery and Infectious Disease.    I have communicated with Bedside RN requesting consult, E. Fowler via [employer] and recommended consultation with Dr.Ortmann regarding topical care guidance for the patient's hands as this presentation exceeds the scope of [place].  [facility] did not see and will not follow. Please reconsult if needed.  Maudie Flakes, MSN, RN, CNS, [place], Serita Grammes, [employer], [employer] phone:  [phone]

## 2022-09-01 NOTE — Progress Notes (Signed)
Subjective:  No new complaints   Antibiotics:  Anti-infectives (From admission, onward)    Start     Dose/Rate Route Frequency Ordered Stop   09/01/22 2000  DAPTOmycin (CUBICIN) 650 mg in sodium chloride 0.9 % IVPB        8 mg/kg  81.9 kg (Adjusted) 126 mL/hr over 30 Minutes Intravenous Every 48 hours 08/31/22 0738     08/31/22 2200  DAPTOmycin (CUBICIN) 650 mg in sodium chloride 0.9 % IVPB  Status:  Discontinued        6 mg/kg  105.7 kg 126 mL/hr over 30 Minutes Intravenous Every 48 hours 08/30/22 1822 08/31/22 0738   08/30/22 1915  DAPTOmycin (CUBICIN) 650 mg in sodium chloride 0.9 % IVPB        6 mg/kg  105.7 kg 126 mL/hr over 30 Minutes Intravenous  Once 08/30/22 1822 08/30/22 2126   08/30/22 1800  cefTRIAXone (ROCEPHIN) 2 g in sodium chloride 0.9 % 100 mL IVPB  Status:  Discontinued        2 g 200 mL/hr over 30 Minutes Intravenous Every 24 hours 08/30/22 1018 09/01/22 0958   08/29/22 1500  ceFAZolin (ANCEF) IVPB 2g/100 mL premix  Status:  Discontinued        2 g 200 mL/hr over 30 Minutes Intravenous On call to O.R. 08/29/22 1454 08/29/22 1705   08/29/22 1430  ceFAZolin (ANCEF) IVPB 2g/100 mL premix        2 g 200 mL/hr over 30 Minutes Intravenous  Once 08/29/22 1418 08/29/22 1456   08/29/22 1417  ceFAZolin (ANCEF) 2-4 GM/100ML-% IVPB       Note to Pharmacy: Cameron Sprang M: cabinet override      08/29/22 1417 08/29/22 1524   08/29/22 1400  piperacillin-tazobactam (ZOSYN) IVPB 2.25 g  Status:  Discontinued        2.25 g 100 mL/hr over 30 Minutes Intravenous Every 8 hours 08/29/22 0501 08/30/22 1018   08/29/22 0500  linezolid (ZYVOX) IVPB 600 mg  Status:  Discontinued        600 mg 300 mL/hr over 60 Minutes Intravenous Every 12 hours 08/29/22 0458 08/30/22 1738   08/29/22 0500  piperacillin-tazobactam (ZOSYN) IVPB 3.375 g        3.375 g 100 mL/hr over 30 Minutes Intravenous  Once 08/29/22 0459 08/29/22 0720       Medications: Scheduled Meds:   apixaban  5 mg Oral BID   Chlorhexidine Gluconate Cloth  6 each Topical Q0600   darbepoetin (ARANESP) injection - DIALYSIS  60 mcg Intravenous Q Thu-HD   furosemide  80 mg Oral BID   insulin aspart  0-24 Units Subcutaneous TID WC   insulin aspart  4 Units Subcutaneous TID WC   insulin detemir  20 Units Subcutaneous Daily   midodrine  10 mg Oral Q T,Th,Sa-HD   mupirocin ointment  1 Application Nasal BID   rosuvastatin  10 mg Oral Daily   sevelamer carbonate  1,600 mg Oral TID WC   Continuous Infusions:  DAPTOmycin (CUBICIN) 650 mg in sodium chloride 0.9 % IVPB     PRN Meds:.acetaminophen **OR** acetaminophen, guaiFENesin, HYDROmorphone (DILAUDID) injection, ondansetron (ZOFRAN) IV, traMADol    Objective: Weight change:   Intake/Output Summary (Last 24 hours) at 09/01/2022 1616 Last data filed at 09/01/2022 1001 Gross per 24 hour  Intake 560 ml  Output --  Net 560 ml    Blood pressure 118/64, pulse (!) 58, temperature 98 F (36.7 C),  temperature source Oral, resp. rate 18, height '5\' 7"'$  (1.702 m), weight 102.8 kg, SpO2 100 %. Temp:  [98 F (36.7 C)-98.5 F (36.9 C)] 98 F (36.7 C) (10/13 0328) Pulse Rate:  [58-84] 58 (10/13 0328) Resp:  [18] 18 (10/13 0328) BP: (93-118)/(59-79) 118/64 (10/13 0328) SpO2:  [100 %] 100 % (10/12 2030)  Physical Exam: Physical Exam Constitutional:      Appearance: He is well-developed.  HENT:     Head: Normocephalic and atraumatic.  Eyes:     Conjunctiva/sclera: Conjunctivae normal.  Cardiovascular:     Rate and Rhythm: Normal rate and regular rhythm.  Pulmonary:     Effort: Pulmonary effort is normal. No respiratory distress.     Breath sounds: No wheezing.  Abdominal:     General: There is no distension.     Palpations: Abdomen is soft.  Musculoskeletal:        General: Normal range of motion.     Cervical back: Normal range of motion and neck supple.  Skin:    General: Skin is warm and dry.     Findings: No erythema or rash.   Neurological:     General: No focal deficit present.     Mental Status: He is alert and oriented to person, place, and time.  Psychiatric:        Mood and Affect: Mood normal.        Behavior: Behavior normal.        Thought Content: Thought content normal.        Judgment: Judgment normal.     Left thumb bandaged  Skin changes relatively stable CBC:    BMET Recent Labs    08/30/22 0817 08/31/22 0800  NA 133* 128*  K 4.3 4.8  CL 90* 88*  CO2 27 25  GLUCOSE 74 208*  BUN 42* 64*  CREATININE 4.67* 5.62*  CALCIUM 8.7* 8.7*      Liver Panel  Recent Labs    08/31/22 0800  ALBUMIN 2.0*        Sedimentation Rate No results for input(s): "ESRSEDRATE" in the last 72 hours.  C-Reactive Protein No results for input(s): "CRP" in the last 72 hours.   Micro Results: Recent Results (from the past 720 hour(s))  WOUND CULTURE     Status: None (Preliminary result)   Collection Time: 08/28/22  4:29 PM   Specimen: Thumb; Wound  Result Value Ref Range Status   MICRO NUMBER: [id number]  Preliminary   SPECIMEN QUALITY: Adequate  Preliminary   SOURCE: WOUND (SITE NOT SPECIFIED)  Preliminary   STATUS: PRELIMINARY  Preliminary   GRAM STAIN:   Preliminary    No white blood cells seen No epithelial cells seen Many Gram positive cocci in clusters  Blood culture (routine x 2)     Status: None (Preliminary result)   Collection Time: 08/28/22 11:30 PM   Specimen: BLOOD RIGHT HAND  Result Value Ref Range Status   Specimen Description BLOOD RIGHT HAND  Final   Special Requests   Final    BOTTLES DRAWN AEROBIC AND ANAEROBIC Blood Culture adequate volume   Culture   Final    NO GROWTH 4 DAYS Performed at [facility], [address], [place], [place] [address]    Report Status PENDING  Incomplete  Blood culture (routine x 2)     Status: None (Preliminary result)   Collection Time: 08/29/22  5:40 AM   Specimen: BLOOD RIGHT FOREARM  Result Value Ref Range Status  Specimen Description BLOOD RIGHT FOREARM  Final   Special Requests   Final    BOTTLES DRAWN AEROBIC AND ANAEROBIC Blood Culture results may not be optimal due to an inadequate volume of blood received in culture bottles   Culture   Final    NO GROWTH 3 DAYS Performed at [facility], [place] [address] [place], [place] [address]    Report Status PENDING  Incomplete  MRSA Next Gen by PCR, Nasal     Status: Abnormal   Collection Time: 08/30/22  3:41 AM   Specimen: Nasal Mucosa; Nasal Swab  Result Value Ref Range Status   MRSA by PCR Next Gen DETECTED (A) NOT DETECTED Final    Comment: CRITICAL RESULT CALLED TO, READ BACK BY AND VERIFIED WITH:  C/ ERIC N., RN 08/30/22 0548 A. LAFRANCE (NOTE) The GeneXpert MRSA Assay (FDA approved for NASAL specimens only), is one component of a comprehensive MRSA colonization surveillance program. It is not intended to diagnose MRSA infection nor to guide or monitor treatment for MRSA infections. Test performance is not FDA approved in patients less than 40 years old. Performed at [facility], [place] [address], [place], [place] [address]   Aerobic/Anaerobic Culture w Gram Stain (surgical/deep wound)     Status: None (Preliminary result)   Collection Time: 08/30/22  1:43 PM   Specimen: PATH Other; Body Fluid  Result Value Ref Range Status   Specimen Description WOUND  Final   Special Requests LEFT THUMB ABSCESS SPEC A  Final   Gram Stain   Final    RARE WBC PRESENT, PREDOMINANTLY MONONUCLEAR FEW GRAM POSITIVE COCCI IN PAIRS IN CLUSTERS RARE GRAM NEGATIVE RODS Performed at [facility], [place] [address], [place], [place] [address]    Culture   Final    MODERATE METHICILLIN RESISTANT STAPHYLOCOCCUS AUREUS MODERATE HAEMOPHILUS PARAINFLUENZAE BETA LACTAMASE NEGATIVE NO ANAEROBES ISOLATED; CULTURE IN PROGRESS FOR 5 DAYS    Report Status PENDING  Incomplete   Organism ID, Bacteria METHICILLIN RESISTANT STAPHYLOCOCCUS AUREUS  Final       Susceptibility   Methicillin resistant staphylococcus aureus - MIC*    CIPROFLOXACIN 4 RESISTANT Resistant     ERYTHROMYCIN >=8 RESISTANT Resistant     GENTAMICIN <=0.5 SENSITIVE Sensitive     OXACILLIN >=4 RESISTANT Resistant     TETRACYCLINE <=1 SENSITIVE Sensitive     VANCOMYCIN 1 SENSITIVE Sensitive     TRIMETH/SULFA <=10 SENSITIVE Sensitive     CLINDAMYCIN <=0.25 SENSITIVE Sensitive     RIFAMPIN <=0.5 SENSITIVE Sensitive     Inducible Clindamycin NEGATIVE Sensitive     * MODERATE METHICILLIN RESISTANT STAPHYLOCOCCUS AUREUS  Aerobic/Anaerobic Culture w Gram Stain (surgical/deep wound)     Status: None (Preliminary result)   Collection Time: 08/30/22  1:51 PM   Specimen: PATH Other; Tissue  Result Value Ref Range Status   Specimen Description TISSUE  Final   Special Requests LEFT THUMB BONE  Final   Gram Stain   Final    NO WBC SEEN RARE GRAM POSITIVE COCCI IN CLUSTERS Performed at [facility], [address] [place], [place] [address]    Culture   Final    MODERATE STAPHYLOCOCCUS AUREUS SUSCEPTIBILITIES TO FOLLOW FEW HAEMOPHILUS PARAINFLUENZAE BETA LACTAMASE NEGATIVE NO ANAEROBES ISOLATED; CULTURE IN PROGRESS FOR 5 DAYS    Report Status PENDING  Incomplete    Studies/Results: DG ESOPHAGUS W SINGLE CM (SOL OR THIN BA)  Result Date: 09/01/2022 CLINICAL DATA:  Provided  history: Dysphagia. Additional history provided: Patient reports feeling as though foods are becoming stuck at the level of the mid chest. Gastroesophageal reflux. History of esophagitis and esophageal erosions. EXAM: ESOPHAGUS/BARIUM SWALLOW/TABLET STUDY TECHNIQUE: A combined double and single contrast examination was performed using effervescent crystals, high-density barium and thin liquid barium. The exam was performed by Brynda Greathouse PA-C, and was supervised and interpreted by Dr. Kellie Simmering. FLUOROSCOPY TIME:  3 minutes, 30 seconds (35.0 mGy). COMPARISON:  None. FINDINGS: Somewhat limited  examination due to the patient's inability to stand and limited ability to reposition on the fluoroscopy table. Apparent focal narrowing within the distal esophagus. A swallowed 13 mm barium tablet did not pass beyond this point despite a prolonged period of observation. Elsewhere, the esophagus appears normal in caliber and smooth in contour. Moderate-to-prominent intermittent esophageal dysmotility with tertiary contraction. Small-volume gastroesophageal reflux observed to the level of the lower esophagus. No appreciable hiatal hernia. IMPRESSION: Apparent focal narrowing within the distal esophagus. A swallowed 13 mm barium tablet did not pass beyond this point despite a prolonged period of observation. 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 Moderate-to-prominent intermittent esophageal dysmotility. Small-volume gastroesophageal reflux to the level of the lower esophagus. Electronically Signed   By: Kellie Simmering D.O.   On: 09/01/2022 14:54   VAS [place] UPPER EXTREMITY ARTERIAL DUPLEX  Result Date: 08/31/2022  UPPER EXTREMITY DUPLEX STUDY Patient Name:  JANATHAN BRIBIESCA [place]  Date of Exam:   08/31/2022 Medical Rec #: [id number]        Accession #:    [id number] Date of Birth: 06/28/1946        Patient Gender: M Patient Age:   34 years Exam Location:  [facility] Procedure:      VAS [place] UPPER EXTREMITY ARTERIAL DUPLEX Referring Phys: Harrell Gave DICKSON --------------------------------------------------------------------------------  Indications: Bilateral pain, discoloration LT hand > RT. History:     Patient has a history of post left AVF ligation.  Limitations: Bandaging bilaterally Comparison Study: 08-13-2020 Prior upper extremity arterial limited LEFT                   pre-access Performing Technologist: Darlin Coco RDMS, RVT  Examination Guidelines: A complete evaluation includes B-mode imaging, spectral Doppler, color  Doppler, and power Doppler as needed of all accessible portions of each vessel. Bilateral testing is considered an integral part of a complete examination. Limited examinations for reoccurring indications may be performed as noted.  Right Doppler Findings: +---------------+----------+-----------+--------+-------------+ Site           PSV (cm/s)Waveform   StenosisComments      +---------------+----------+-----------+--------+-------------+ Subclavian Dist          triphasic                        +---------------+----------+-----------+--------+-------------+ Brachial Prox  68        triphasic          0.59 diameter +---------------+----------+-----------+--------+-------------+ Brachial Mid   90        triphasic          0.44 diameter +---------------+----------+-----------+--------+-------------+ Brachial Dist  75        triphasic          0.50 diameter +---------------+----------+-----------+--------+-------------+ Radial Prox    61        triphasic          0.20 diameter +---------------+----------+-----------+--------+-------------+ Radial Mid     64  triphasic          0.19 diameter +---------------+----------+-----------+--------+-------------+ Radial Dist    67        triphasic          0.20 diameter +---------------+----------+-----------+--------+-------------+ Ulnar Prox     62        triphasic          0.28 diameter +---------------+----------+-----------+--------+-------------+ Ulnar Mid      80        triphasic          0.20 diameter +---------------+----------+-----------+--------+-------------+ Ulnar Dist     105       triphasic          0.22 diameter +---------------+----------+-----------+--------+-------------+ Palmar Arch    41        multiphasic                      +---------------+----------+-----------+--------+-------------+   Left Doppler Findings: +---------------+----------+-----------+--------+--------+ Site            PSV (cm/s)Waveform   StenosisComments +---------------+----------+-----------+--------+--------+ Subclavian Prox68        triphasic                   +---------------+----------+-----------+--------+--------+ Subclavian Mid 71        triphasic                   +---------------+----------+-----------+--------+--------+ Subclavian Dist65        triphasic                   +---------------+----------+-----------+--------+--------+ Axillary       89        triphasic                   +---------------+----------+-----------+--------+--------+ Brachial Prox  67        triphasic                   +---------------+----------+-----------+--------+--------+ Brachial Mid   91        triphasic                   +---------------+----------+-----------+--------+--------+ Brachial Dist  71        triphasic                   +---------------+----------+-----------+--------+--------+ Radial Prox    113       triphasic                   +---------------+----------+-----------+--------+--------+ Radial Mid     95        triphasic                   +---------------+----------+-----------+--------+--------+ Radial Dist    84        triphasic                   +---------------+----------+-----------+--------+--------+ Ulnar Prox     69        triphasic                   +---------------+----------+-----------+--------+--------+ Ulnar Mid      85        triphasic                   +---------------+----------+-----------+--------+--------+ Ulnar Dist     92        triphasic                   +---------------+----------+-----------+--------+--------+ Palmar  Arch    37        multiphasic                 +---------------+----------+-----------+--------+--------+   Summary:  Right: No obstruction visualized in the right upper extremity. Left: No obstruction visualized in the left upper extremity. *See table(s) above for measurements and  observations. Electronically signed by Orlie Pollen on 08/31/2022 at 8:12:23 PM.    Final    DG CHEST PORT 1 VIEW  Result Date: 08/31/2022 CLINICAL DATA:  Aspiration pneumonia. EXAM: PORTABLE CHEST 1 VIEW COMPARISON:  08/29/2022 FINDINGS: The left IJ dialysis catheter is stable. Stable borderline cardiac enlargement. Persistent left lower lobe infiltrate and left pleural effusion. IMPRESSION: Persistent left lower lobe infiltrate and left pleural effusion. Electronically Signed   By: Marijo Sanes M.D.   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 male with end-stage renal disease on hemodialysis who has had tingling and waning ischemic digits of both hands now with osteomyelitis involving the left great t distal phalanx x humb as well as possible osteomyelitis in the right hand involving of the right thumb as well as the fourth distal phalanx  #1 Osteomyelitis: He is underground ligation of left upper extremity AV fistula to improve blood flow in his hand and now undergone I&D of the left thumb flexor sheath incision and drainage of the bone curettage and debridement of the distal phalanx.  Grown MRSA as well as a nonbeta-lactamase producing haemophilus parainfluenza from cultures  We did stop ceftriaxone this morning and continue daptomycin prior to the gram-negative growing  having been isolated.  Now that I see that he has muffle his parainfluenza I think ceftazidime will be useful antibiotic as it can be given with dialysis along with daptomycin  Plan on giving him a 6-week course of both antimicrobials postoperatively with follow-up with hand surgery and close follow-up with [place] in ID   2.  History of colostrum difficile colitis hopefully we can narrow him to antibiotics that are less risky for causing C. 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   [name] has an appointment on 10/04/2022 at 78PM with Dr. [name]  [facility], which  is located in the [facility] at  [place] in [place].  [address], which is located to the left of the elevators.  Phone: [phone]  Fax: [phone]  [email or web address]  The patient should arrive 30 minutes prior to  their appoitment.   NOTE IF NO FURTHER SURGICAL INTERVENTIONS being entertained and no new culture data 6 weeks of IV dapto and ceftazidime with HD is his antibiotic plan  I will otherwise sign off for now and plan on seeing him in clinic.  Please call with further questions or if new changes to her condition arise that we need to make new interventions up on in terms of changing his antibiotics.    LOS: 2 days   Alcide Evener 09/01/2022, 4:16 PM

## 2022-09-01 NOTE — Progress Notes (Addendum)
Pharmacy Antibiotic Note  Louis [name] is a 76 y.o. male on day #4 antibiotics for polymicrobial osteomyelitis. Pharmacy consulted for Ceftazidime dosing. Changing from Ceftriaxone, last dose given 10/12. Also previously consulted for Daptomycin dosing. Hx allergy to Vancomycin.   ESRD, usual TTS HD. Last HD 10/12, expecting on schedule 10/14.  Planning 6 weeks IV antibiotics.  Plan: Ceftazidime 1gm IV x 1 tonight, then 2gm IV TTS after HD. Follow HD plans for any need to adjust regimen. Continue Daptomycin 650 mg (~8 mg/kg) IV q48h. Plan post-HD dosing later. CK weekly while on Daptomycin.  Height: '5\' 7"'$  (170.2 cm) Weight: 102.8 kg (226 lb 10.1 oz) IBW/kg (Calculated) : 66.1  Temp (24hrs), Avg:98.3 F (36.8 C), Min:98 F (36.7 C), Max:98.5 F (36.9 C)  Recent Labs  Lab 08/28/22 1630 08/28/22 2323 08/29/22 0539 08/29/22 1420 08/30/22 0817 08/31/22 0800 09/01/22 1120  WBC 16.5* 16.3*  --   --  12.6* 13.1* 13.8*  CREATININE  --  6.76*  --  7.40* 4.67* 5.62*  --   LATICACIDVEN  --  1.5 2.0*  --   --   --   --     ESRD   Allergies  Allergen Reactions   Hydrocodone Nausea And Vomiting    Other reaction(s): GI Upset (intolerance) Projectile vomiting    Oxycodone Nausea And Vomiting    Other reaction(s): GI Upset (intolerance), Vomiting (intolerance) Projectile vomiting    Vancomycin Anaphylaxis    ORAL VANCOMYCIN for C diff.   Dacarbazine Other (See Comments)    Unknown reaction   Latex Itching and Rash   Tape Dermatitis, Itching and Rash    Patch used at dialysis    Antimicrobials this admission: Linezolid 10/10>> 10/11 Zosyn 10/10 >> 10/11 Mupirocin nasal 10/11>>(10/15) Daptomycin 10/11 >> Ceftriaxone 10/11 >>10/13 (LD 10/12) Ceftazadime 10/13 >>  Microbiology results: 10/09 blood: no growth x 4 days to date 10/10 blood: no growth x 3 days to date 10/11 MRSA PCR: detected 10/11 L thumb abscess: mod MRSA, mod Haemophilus - sens pend 10/11 L thumb  bone: mod Staph aureus, few Haemophilus - sens pend  Thank you for allowing pharmacy to be a part of this patient's care.  Arty Baumgartner, [place] 09/01/2022 8:34 PM

## 2022-09-02 DIAGNOSIS — M86042 Acute hematogenous osteomyelitis, left hand: Secondary | ICD-10-CM | POA: Diagnosis not present

## 2022-09-02 LAB — RENAL FUNCTION PANEL
Albumin: 2 g/dL — ABNORMAL LOW (ref 3.5–5.0)
Albumin: 1.9 g/dL — ABNORMAL LOW (ref 3.5–5.0)
Anion gap: 15 (ref 5–15)
Anion gap: 14 (ref 5–15)
BUN: 76 mg/dL — ABNORMAL HIGH (ref 8–23)
BUN: 75 mg/dL — ABNORMAL HIGH (ref 8–23)
CO2: 23 mmol/L (ref 22–32)
CO2: 25 mmol/L (ref 22–32)
Calcium: 8.6 mg/dL — ABNORMAL LOW (ref 8.9–10.3)
Calcium: 8.6 mg/dL — ABNORMAL LOW (ref 8.9–10.3)
Chloride: 89 mmol/L — ABNORMAL LOW (ref 98–111)
Chloride: 88 mmol/L — ABNORMAL LOW (ref 98–111)
Creatinine, Ser: 5.92 mg/dL — ABNORMAL HIGH (ref 0.61–1.24)
Creatinine, Ser: 5.95 mg/dL — ABNORMAL HIGH (ref 0.61–1.24)
GFR, Estimated: 9 mL/min — ABNORMAL LOW (ref >60)
GFR, Estimated: 9 mL/min — ABNORMAL LOW (ref >60)
Glucose, Bld: 117 mg/dL — ABNORMAL HIGH (ref 70–99)
Glucose, Bld: 112 mg/dL — ABNORMAL HIGH (ref 70–99)
Phosphorus: 5.9 mg/dL — ABNORMAL HIGH (ref 2.5–4.6)
Phosphorus: 5.8 mg/dL — ABNORMAL HIGH (ref 2.5–4.6)
Potassium: 4.6 mmol/L (ref 3.5–5.1)
Potassium: 4.3 mmol/L (ref 3.5–5.1)
Sodium: 127 mmol/L — ABNORMAL LOW (ref 135–145)
Sodium: 127 mmol/L — ABNORMAL LOW (ref 135–145)

## 2022-09-02 LAB — WOUND CULTURE
MICRO NUMBER:: 14037230
SPECIMEN QUALITY:: ADEQUATE

## 2022-09-02 LAB — GLUCOSE, CAPILLARY
Glucose-Capillary: 90 mg/dL (ref 70–99)
Glucose-Capillary: 173 mg/dL — ABNORMAL HIGH (ref 70–99)
Glucose-Capillary: 156 mg/dL — ABNORMAL HIGH (ref 70–99)

## 2022-09-02 LAB — CULTURE, BLOOD (ROUTINE X 2)
Culture: NO GROWTH
Special Requests: ADEQUATE

## 2022-09-02 MED ORDER — HEPARIN SODIUM (PORCINE) 1000 UNIT/ML IJ SOLN
INTRAMUSCULAR | Status: AC
  Filled 2022-09-02: qty 1

## 2022-09-02 MED ORDER — ALTEPLASE 2 MG IJ SOLR: 2.0000 mg | Freq: Once | INTRAMUSCULAR | Status: DC | PRN

## 2022-09-02 MED ORDER — HEPARIN SODIUM (PORCINE) 1000 UNIT/ML DIALYSIS: 1000.0000 [IU] | INTRAMUSCULAR | Status: DC | PRN

## 2022-09-02 MED ORDER — HEPARIN SODIUM (PORCINE) 1000 UNIT/ML DIALYSIS
1000.0000 [IU] | INTRAMUSCULAR | Status: DC | PRN
  Administered 2022-09-02: 1000 [IU]

## 2022-09-02 MED ORDER — ANTICOAGULANT SODIUM CITRATE 4% (200MG/5ML) IV SOLN: 5.0000 mL | Status: DC | PRN

## 2022-09-02 NOTE — Progress Notes (Addendum)
Subjective: Seen in hemodialysis room tolerating UF reports hand discomfort improving.  Objective Vital signs in last 24 hours: Vitals:   09/02/22 1030 09/02/22 1100 09/02/22 1113 09/02/22 1122  BP: 112/64 108/65 106/63 107/61  Pulse: 60 64 (!) 59 (!) 59  Resp: '16 14 17 15  '$ Temp:    98.5 F (36.9 C)  TempSrc:    Oral  SpO2: 96% 96% 90% 95%  Weight:    101.6 kg  Height:       Weight change:   Physical Exam: General: Alert chronically ill-appearing male, pleasant, NAD Heart: RRR no MRG Lungs: CTA unlabored breathing on room air 100 percent O2 sat Abdomen: NABS, soft NTND Extremities: 1+ edema thighs to trace to 1+ ankle Dialysis Access: Left subclavian TDC, patent on HD ,L arm AV fistula no bruit status post ligation   OP dialysis Orders: TTS SW 4h 450/ 500  101 kg  3K/2.5 bath  Hep none LUA AVF - last HD post HD 101.6kg on 10/7 - last Hb 10.0 10/5 - mircera 50 q 2, last 9/26 - venofer 100 mg tiw IV thru 10/19 - doxercalciferol 5 ug ttw IV   Home meds include - eliquis, furosemide 40 qd, isordil 20, midodrine 10 prn, novolin insulin, rosuvastatin, vits/ supps / prns Problem/Plan: 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 Apolonio Schneiders (Hand ortho)= noted per ID antibiotic plan now daptomycin and Fortaz  6wk total follow-up in ID clinic on 10/04/2022 and per [facility] has adequate blood flow no angiogram needed DM2= plan per admit team Anemia yesterday-Hgb 10.6 <9.6 (10/12 )Aranesp 60 q. Thursday weekly/no iron with infection Secondary hyperparathyroidism -phosphorus 5.8 <7.4, C ca  in goal Renvela 800  increased 1600 mg, needs carb modified renal diet change this a.m.  Ernest Haber, PA-C [facility] Beeper [phone] 09/02/2022,12:21 PM  LOS: 3 days   Labs: Basic Metabolic Panel: Recent Labs  Lab 08/31/22 0800 09/02/22 0702 09/02/22 0834  NA 128* 127* 127*  K 4.8 4.6 4.3  CL 88* 89* 88*  CO2 '25 23 25  '$ GLUCOSE 208* 117* 112*  BUN 64* 76* 75*  CREATININE 5.62* 5.92* 5.95*  CALCIUM 8.7* 8.6* 8.6*  PHOS 7.4* 5.9* 5.8*   Liver Function Tests: Recent Labs  Lab 08/28/22 2323 08/31/22 0800 09/02/22 0702 09/02/22 0834  AST 26  --   --   --   ALT 23  --   --   --   ALKPHOS 248*  --   --   --   BILITOT 1.0  --   --   --   PROT 6.5  --   --   --   ALBUMIN 2.4* 2.0* 2.0* 1.9*   No results for input(s): "LIPASE", "AMYLASE" in the last 168 hours. No results for input(s): "AMMONIA" in the last 168 hours. CBC: Recent Labs  Lab 08/28/22 1630 08/28/22 2323 08/29/22 1420 08/30/22 0817 08/31/22 0800 09/01/22 1120  WBC 16.5* 16.3*  --  12.6* 13.1* 13.8*  NEUTROABS 14,421* 14.4*  --   --   --  12.3*  HGB 10.7* 10.4*   < > 10.2* 9.6*  10.6*  HCT 33.9* 33.3*   < > 33.4* 30.5* 33.4*  MCV 93.1 95.7  --  95.7 95.9 95.4  PLT 217 215  --  211 206 270   < > = values in this interval not displayed.   Cardiac Enzymes: Recent Labs  Lab 08/31/22 0456  CKTOTAL 68   CBG: Recent Labs  Lab 08/31/22 2055 09/01/22 0811 09/01/22 1146 09/01/22 1631 09/01/22 2008  GLUCAP 157* 96 152* 124* 193*    Studies/Results: DG ESOPHAGUS W SINGLE CM (SOL OR THIN BA)  Result Date: 09/01/2022 CLINICAL DATA:  Provided history: Dysphagia. Additional history provided: Patient reports feeling as though foods are becoming stuck at the level of the mid chest. Gastroesophageal reflux. History of esophagitis and esophageal erosions. EXAM: ESOPHAGUS/BARIUM SWALLOW/TABLET STUDY TECHNIQUE: A combined double and single contrast examination was performed using effervescent crystals,  high-density barium and thin liquid barium. The exam was performed by Brynda Greathouse PA-C, and was supervised and interpreted by Dr. Kellie Simmering. FLUOROSCOPY TIME:  3 minutes, 30 seconds (35.0 mGy). COMPARISON:  None. FINDINGS: Somewhat limited examination due to the patient's inability to stand and limited ability to reposition on the fluoroscopy table. Apparent focal narrowing within the distal esophagus. A swallowed 13 mm barium tablet did not pass beyond this point despite a prolonged period of observation. Elsewhere, the esophagus appears normal in caliber and smooth in contour. Moderate-to-prominent intermittent esophageal dysmotility with tertiary contraction. Small-volume gastroesophageal reflux observed to the level of the lower esophagus. No appreciable hiatal hernia. IMPRESSION: Apparent focal narrowing within the distal esophagus. A swallowed 13 mm barium tablet did not pass beyond this point despite a prolonged period of observation. 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 Moderate-to-prominent intermittent esophageal dysmotility. Small-volume gastroesophageal reflux to the level of the lower esophagus. Electronically Signed   By: Kellie Simmering D.O.   On: 09/01/2022 14:54   VAS [place] UPPER EXTREMITY ARTERIAL DUPLEX  Result Date: 08/31/2022  UPPER EXTREMITY DUPLEX STUDY Patient Name:  ANDRI PRESTIA Hardrick  Date of Exam:   08/31/2022 Medical Rec #: [id number]        Accession #:    [id number] Date of Birth: [birth year]        Patient Gender: M Patient Age:   45 years Exam Location:  [facility] Procedure:      VAS [place] UPPER EXTREMITY ARTERIAL DUPLEX Referring Phys: Harrell Gave DICKSON --------------------------------------------------------------------------------  Indications: Bilateral pain, discoloration LT hand > RT. History:     Patient has a history of post left AVF ligation.  Limitations: Bandaging bilaterally  Comparison Study: 08-13-2020 Prior upper extremity arterial limited LEFT                   pre-access Performing Technologist: Darlin Coco RDMS, RVT  Examination Guidelines: A complete evaluation includes B-mode imaging, spectral Doppler, color Doppler, and power Doppler as needed of all accessible portions of each vessel. Bilateral testing is considered an integral part of a complete examination. Limited examinations for reoccurring indications may be performed as noted.  Right Doppler Findings: +---------------+----------+-----------+--------+-------------+ Site           PSV (cm/s)Waveform   StenosisComments      +---------------+----------+-----------+--------+-------------+ Subclavian Dist          triphasic                        +---------------+----------+-----------+--------+-------------+ Brachial Prox  68  triphasic          0.59 diameter +---------------+----------+-----------+--------+-------------+ Brachial Mid   90        triphasic          0.44 diameter +---------------+----------+-----------+--------+-------------+ Brachial Dist  75        triphasic          0.50 diameter +---------------+----------+-----------+--------+-------------+ Radial Prox    61        triphasic          0.20 diameter +---------------+----------+-----------+--------+-------------+ Radial Mid     64        triphasic          0.19 diameter +---------------+----------+-----------+--------+-------------+ Radial Dist    67        triphasic          0.20 diameter +---------------+----------+-----------+--------+-------------+ Ulnar Prox     62        triphasic          0.28 diameter +---------------+----------+-----------+--------+-------------+ Ulnar Mid      80        triphasic          0.20 diameter +---------------+----------+-----------+--------+-------------+ Ulnar Dist     105       triphasic          0.22 diameter  +---------------+----------+-----------+--------+-------------+ Palmar Arch    41        multiphasic                      +---------------+----------+-----------+--------+-------------+   Left Doppler Findings: +---------------+----------+-----------+--------+--------+ Site           PSV (cm/s)Waveform   StenosisComments +---------------+----------+-----------+--------+--------+ Subclavian Prox68        triphasic                   +---------------+----------+-----------+--------+--------+ Subclavian Mid 71        triphasic                   +---------------+----------+-----------+--------+--------+ Subclavian Dist65        triphasic                   +---------------+----------+-----------+--------+--------+ Axillary       89        triphasic                   +---------------+----------+-----------+--------+--------+ Brachial Prox  67        triphasic                   +---------------+----------+-----------+--------+--------+ Brachial Mid   91        triphasic                   +---------------+----------+-----------+--------+--------+ Brachial Dist  71        triphasic                   +---------------+----------+-----------+--------+--------+ Radial Prox    113       triphasic                   +---------------+----------+-----------+--------+--------+ Radial Mid     95        triphasic                   +---------------+----------+-----------+--------+--------+ Radial Dist    84        triphasic                   +---------------+----------+-----------+--------+--------+ Ulnar Prox  69        triphasic                   +---------------+----------+-----------+--------+--------+ Ulnar Mid      85        triphasic                   +---------------+----------+-----------+--------+--------+ Ulnar Dist     92        triphasic                   +---------------+----------+-----------+--------+--------+ Palmar Arch    37         multiphasic                 +---------------+----------+-----------+--------+--------+   Summary:  Right: No obstruction visualized in the right upper extremity. Left: No obstruction visualized in the left upper extremity. *See table(s) above for measurements and observations. Electronically signed by Orlie Pollen on 08/31/2022 at 8:12:23 PM.    Final    DG CHEST PORT 1 VIEW  Result Date: 08/31/2022 CLINICAL DATA:  Aspiration pneumonia. EXAM: PORTABLE CHEST 1 VIEW COMPARISON:  08/29/2022 FINDINGS: The left IJ dialysis catheter is stable. Stable borderline cardiac enlargement. Persistent left lower lobe infiltrate and left pleural effusion. IMPRESSION: Persistent left lower lobe infiltrate and left pleural effusion. Electronically Signed   By: Marijo Sanes M.D.   On: 08/31/2022 15:32   Medications:  cefTAZidime (FORTAZ)  IV 2 g (09/02/22 1009)   DAPTOmycin (CUBICIN) 650 mg in sodium chloride 0.9 % IVPB 650 mg (09/01/22 2009)    apixaban  5 mg Oral BID   Chlorhexidine Gluconate Cloth  6 each Topical Q0600   darbepoetin (ARANESP) injection - DIALYSIS  60 mcg Intravenous Q Thu-HD   furosemide  80 mg Oral BID   heparin sodium (porcine)       insulin aspart  0-24 Units Subcutaneous TID WC   insulin aspart  4 Units Subcutaneous TID WC   insulin detemir  20 Units Subcutaneous Daily   midodrine  10 mg Oral Q T,Th,Sa-HD   mupirocin ointment  1 Application Nasal BID   rosuvastatin  10 mg Oral Daily   sevelamer carbonate  1,600 mg Oral TID WC

## 2022-09-02 NOTE — Progress Notes (Signed)
Glucose checked per spouse -105

## 2022-09-02 NOTE — Progress Notes (Signed)
   09/02/22 1122  Vitals  Temp 98.5 F (36.9 C)  Temp Source Oral  BP 107/61  MAP (mmHg) 83  BP Location Right Arm  BP Method Automatic  Patient Position (if appropriate) Lying  Pulse Rate (!) 59  Pulse Rate Source Monitor  ECG Heart Rate 65  Resp 15  Oxygen Therapy  SpO2 95 %  O2 Device Room Air  Pulse Oximetry Type Continuous   Received patient in bed to unit.  Alert and oriented.  Informed consent signed and in chart.   Treatment initiated: 0809 Treatment completed: 1113  Patient tolerated well.  Transported back to the room  Alert, without acute distress.  Hand-off given to patient's nurse.   Access used: HD cath Access issues: NA  Total UF removed: 4042m Medication(s) given: Fortaz 2g IVPB, Heparin dwell 4200 units Post HD VS: see above Post HD weight: 101.6kg   [facility]

## 2022-09-02 NOTE — Progress Notes (Signed)
PROGRESS NOTE    Louis [name]  JKK:[id number] DOB: May 01, 1946 DOA: 08/28/2022 PCP: [name], MD   Brief Narrative:  HPI: Louis [name] is a 76 y.o. with history of ESRD on hemodialysis, type 2 diabetes, CHF, CAD, hyperlipidemia, hypertension presented to the ER at the recommendation of his failure medicine physician.  Patient had left hand swelling abdominal swelling for the last 2 days.  He has noticed bloody drainage starting yesterday so he presented to the ER.  He has had wounds on his hands for a year however never to this severity.  In the ER he had x-rays obtained which demonstrated extensive soft tissue swelling of the left thumb concern for osteomyelitis.  Other labs were obtained which showed CBC WBC 16.5, hemoglobin 10.7, sodium 131, potassium 4.7, creatinine 6.7, lactic acid 1.5, magnesium 3.  Hand surgery was consulted in the ED the patient was admitted for IV antibiotics.  He is allergic to vancomycin so he was given linezolid.  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  Patient underwent left upper arm AV fistula ligation and placement of left IJ TDC by vascular surgery on 08/29/2022.  He then underwent Left thumb flexor sheath incision and drainage by Dr. Caralyn Guile on 08/30/2022.  Plan for surgical procedure on the right hand on Monday, according to  patient. Intraoperative cultures are growing MRSA.  ID on board, currently patient  daptomycin and Fortaz per ID and they recommend continuing these for 6 weeks with dialysis and follow-up with ID in the clinic on 10/04/2022.   Vascular surgery also saw him and per them, he has good blood flow so no angiogram is needed.  ESRD on HD: Nephrology on board.  Hyperlipidemia: Continue Crestor.  Type 2 diabetes mellitus: Blood sugar labile.  Continue Lantus 20 units and SSI.  Acute hyponatremia: We will defer to nephrology since he is a dialysis patient.  Permanent atrial fibrillation: Rates controlled.  He is not on any rate control medications. Orthopedics cleared him to resume Eliquis on 09/01/2022.  Dysphagia: Patient was noted to have some aspiration event on 08/31/2022 with the nurses.  Evaluated by SLP, they suspect esophageal dysphagia and recommended esophagogram which is completed and shows distal esophageal stenosis.  Patient may benefit from EGD and dilatation.  I have consulted GI.    DVT prophylaxis: SCD's Start: 08/30/22 1455 SCDs Start: 08/29/22 0549 we will start him on heparin.   Code Status: Full Code  Family Communication: none present at bedside.  Plan of care discussed with patient in length and he/she verbalized understanding and agreed with it.  Status is: Inpatient Remains inpatient appropriate because: Needs another surgery in the right hand which is planned next week.     Estimated body mass index is 36.29 kg/m as calculated from the following:   Height as of this encounter: '5\' 7"'$  (1.702 m).  Weight as of this encounter: 105.1 kg.    Nutritional Assessment: Body mass index is 36.29 kg/m.Marland Kitchen Seen by dietician.  I agree with the assessment and plan as outlined below: Nutrition Status:        . Skin Assessment: I have examined the patient's skin and I agree with the wound assessment as performed by the wound care RN as outlined below:    Consultants:   Ortho  ID Vascular surgery Nephrology  Procedures:  As above  Antimicrobials:  Anti-infectives (From admission, onward)    Start     Dose/Rate Route Frequency Ordered Stop   09/02/22 1200  cefTAZidime (FORTAZ) 2 g in sodium chloride 0.9 % 100 mL IVPB        2 g 200 mL/hr over 30 Minutes Intravenous Every T-Th-Sa (Hemodialysis) 09/01/22 1654     09/01/22 2000  DAPTOmycin (CUBICIN) 650 mg in sodium chloride 0.9 % IVPB        8 mg/kg  81.9 kg (Adjusted) 126 mL/hr over 30 Minutes Intravenous Every 48 hours 08/31/22 0738     09/01/22 1830  cefTAZidime (FORTAZ) 1 g in sodium chloride 0.9 % 100 mL IVPB        1 g 200 mL/hr over 30 Minutes Intravenous  Once 09/01/22 1654 09/01/22 2212   08/31/22 2200  DAPTOmycin (CUBICIN) 650 mg in sodium chloride 0.9 % IVPB  Status:  Discontinued        6 mg/kg  105.7 kg 126 mL/hr over 30 Minutes Intravenous Every 48 hours 08/30/22 1822 08/31/22 0738   08/30/22 1915  DAPTOmycin (CUBICIN) 650 mg in sodium chloride 0.9 % IVPB        6 mg/kg  105.7 kg 126 mL/hr over 30 Minutes Intravenous  Once 08/30/22 1822 08/30/22 2126   08/30/22 1800  cefTRIAXone (ROCEPHIN) 2 g in sodium chloride 0.9 % 100 mL IVPB  Status:  Discontinued        2 g 200 mL/hr over 30 Minutes Intravenous Every 24 hours 08/30/22 1018 09/01/22 0958   08/29/22 1500  ceFAZolin (ANCEF) IVPB 2g/100 mL premix  Status:  Discontinued        2 g 200 mL/hr over 30 Minutes Intravenous On call to O.R. 08/29/22 1454 08/29/22 1705   08/29/22 1430  ceFAZolin (ANCEF) IVPB 2g/100 mL premix        2 g 200 mL/hr over 30 Minutes Intravenous  Once 08/29/22 1418 08/29/22 1456   08/29/22 1417  ceFAZolin (ANCEF) 2-4 GM/100ML-% IVPB       Note to Pharmacy: [place] Sprang M: cabinet override      08/29/22 1417 08/29/22 1524   08/29/22 1400  piperacillin-tazobactam (ZOSYN) IVPB 2.25 g  Status:  Discontinued        2.25 g 100 mL/hr over 30 Minutes Intravenous Every 8 hours 08/29/22 0501 08/30/22 1018    08/29/22 0500  linezolid (ZYVOX) IVPB 600 mg  Status:  Discontinued        600 mg 300 mL/hr over 60 Minutes Intravenous Every 12 hours 08/29/22 0458 08/30/22 1738   08/29/22 0500  piperacillin-tazobactam (ZOSYN) IVPB 3.375 g        3.375 g 100 mL/hr over 30 Minutes Intravenous  Once 08/29/22 0459 08/29/22 0720         Subjective:  Seen and examined in dialysis unit.  No complaints today other than bilateral hand pain which is improving.  Objective: Vitals:   09/01/22 0328 09/01/22 2007 09/02/22 0439 09/02/22 0749  BP: 118/64 Marland Kitchen)  105/57 126/68   Pulse: (!) 58 (!) 45 64   Resp: '18 15 16   '$ Temp: 98 F (36.7 C) 98.5 F (36.9 C) 98.8 F (37.1 C)   TempSrc: Oral     SpO2:  95% 100%   Weight:    105.1 kg  Height:        Intake/Output Summary (Last 24 hours) at 09/02/2022 0840 Last data filed at 09/01/2022 1001 Gross per 24 hour  Intake 360 ml  Output --  Net 360 ml    Filed Weights   08/31/22 0744 08/31/22 1222 09/02/22 0749  Weight: 106.8 kg 102.8 kg 105.1 kg    Examination:  General exam: Appears calm and comfortable  Respiratory system: Clear to auscultation. Respiratory effort normal. Cardiovascular system: S1 & S2 heard, RRR. No JVD, murmurs, rubs, gallops or clicks. No pedal edema. Gastrointestinal system: Abdomen is nondistended, soft and nontender. No organomegaly or masses felt. Normal bowel sounds heard. Central nervous system: Alert and oriented. No focal neurological deficits. Extremities: Symmetric 5 x 5 power. Skin: Dressing in the left thumb.  Ulcerated fingers of both hands. Psychiatry: Judgement and insight appear normal. Mood & affect appropriate.   Data Reviewed: I have personally reviewed following labs and imaging studies  CBC: Recent Labs  Lab 08/28/22 1630 08/28/22 2323 08/29/22 1420 08/30/22 0817 08/31/22 0800 09/01/22 1120  WBC 16.5* 16.3*  --  12.6* 13.1* 13.8*  NEUTROABS 14,421* 14.4*  --   --   --  12.3*  HGB 10.7* 10.4* 11.9*  10.2* 9.6* 10.6*  HCT 33.9* 33.3* 35.0* 33.4* 30.5* 33.4*  MCV 93.1 95.7  --  95.7 95.9 95.4  PLT 217 215  --  211 206 409    Basic Metabolic Panel: Recent Labs  Lab 08/28/22 2323 08/29/22 1420 08/30/22 0816 08/30/22 0817 08/31/22 0800 09/02/22 0702  NA 131* 127*  --  133* 128* 127*  K 4.7 5.0  --  4.3 4.8 4.6  CL 90* 90*  --  90* 88* 89*  CO2 27  --   --  '27 25 23  '$ GLUCOSE 137* 102*  --  74 208* 117*  BUN 68* 74*  --  42* 64* 76*  CREATININE 6.76* 7.40*  --  4.67* 5.62* 5.92*  CALCIUM 8.6*  --   --  8.7* 8.7* 8.6*  MG 3.0*  --   --   --   --   --   PHOS  --   --  4.9*  --  7.4* 5.9*    GFR: Estimated Creatinine Clearance: 12.3 mL/min (A) (by C-G formula based on SCr of 5.92 mg/dL (H)). Liver Function Tests: Recent Labs  Lab 08/28/22 2323 08/31/22 0800 09/02/22 0702  AST 26  --   --   ALT 23  --   --   ALKPHOS 248*  --   --   BILITOT 1.0  --   --   PROT 6.5  --   --   ALBUMIN 2.4* 2.0* 2.0*    No results for input(s): "LIPASE", "AMYLASE" in the last 168 hours. No results for input(s): "AMMONIA" in the last 168 hours. Coagulation Profile: No results for input(s): "INR", "PROTIME" in the last 168 hours. Cardiac Enzymes: Recent Labs  Lab 08/31/22 0456  CKTOTAL 68    BNP (last 3 results) No results for input(s): "PROBNP" in the last 8760 hours. HbA1C: No results for input(s): "HGBA1C" in the last 72 hours. CBG: Recent Labs  Lab 08/31/22 2055 09/01/22 8119  09/01/22 1146 09/01/22 1631 09/01/22 2008  GLUCAP 157* 96 152* 124* 193*    Lipid Profile: No results for input(s): "CHOL", "HDL", "LDLCALC", "TRIG", "CHOLHDL", "LDLDIRECT" in the last 72 hours. Thyroid Function Tests: No results for input(s): "TSH", "T4TOTAL", "FREET4", "T3FREE", "THYROIDAB" in the last 72 hours. Anemia Panel: No results for input(s): "VITAMINB12", "FOLATE", "FERRITIN", "TIBC", "IRON", "RETICCTPCT" in the last 72 hours. Sepsis Labs: Recent Labs  Lab 08/28/22 2323  08/29/22 0539 09/01/22 1120  PROCALCITON  --   --  1.81  LATICACIDVEN 1.5 2.0*  --      Recent Results (from the past 240 hour(s))  WOUND CULTURE     Status: None (Preliminary result)   Collection Time: 08/28/22  4:29 PM   Specimen: Thumb; Wound  Result Value Ref Range Status   MICRO NUMBER: [id number]  Preliminary   SPECIMEN QUALITY: Adequate  Preliminary   SOURCE: WOUND (SITE NOT SPECIFIED)  Preliminary   STATUS: PRELIMINARY  Preliminary   GRAM STAIN:   Preliminary    No white blood cells seen No epithelial cells seen Many Gram positive cocci in clusters  Blood culture (routine x 2)     Status: None (Preliminary result)   Collection Time: 08/28/22 11:30 PM   Specimen: BLOOD RIGHT HAND  Result Value Ref Range Status   Specimen Description BLOOD RIGHT HAND  Final   Special Requests   Final    BOTTLES DRAWN AEROBIC AND ANAEROBIC Blood Culture adequate volume   Culture   Final    NO GROWTH 4 DAYS Performed at [facility], [address] [place], [place] [address]    Report Status PENDING  Incomplete  Blood culture (routine x 2)     Status: None (Preliminary result)   Collection Time: 08/29/22  5:40 AM   Specimen: BLOOD RIGHT FOREARM  Result Value Ref Range Status   Specimen Description BLOOD RIGHT FOREARM  Final   Special Requests   Final    BOTTLES DRAWN AEROBIC AND ANAEROBIC Blood Culture results may not be optimal due to an inadequate volume of blood received in culture bottles   Culture   Final    NO GROWTH 3 DAYS Performed at [facility], [place] [address] [place], [place] [address]    Report Status PENDING  Incomplete  MRSA Next Gen by PCR, Nasal     Status: Abnormal   Collection Time: 08/30/22  3:41 AM   Specimen: Nasal Mucosa; Nasal Swab  Result Value Ref Range Status   MRSA by PCR Next Gen DETECTED (A) NOT DETECTED Final    Comment: CRITICAL RESULT CALLED TO, READ BACK BY AND VERIFIED WITH:  C/ ERIC N., RN 08/30/22 0548 A. LAFRANCE (NOTE) The  GeneXpert MRSA Assay (FDA approved for NASAL specimens only), is one component of a comprehensive MRSA colonization surveillance program. It is not intended to diagnose MRSA infection nor to guide or monitor treatment for MRSA infections. Test performance is not FDA approved in patients less than 9 years old. Performed at [facility], [place] [address] [place], [place] [address]   Aerobic/Anaerobic Culture w Gram Stain (surgical/deep wound)     Status: None (Preliminary result)   Collection Time: 08/30/22  1:43 PM   Specimen: PATH Other; Body Fluid  Result Value Ref Range Status   Specimen Description WOUND  Final   Special Requests LEFT THUMB ABSCESS SPEC A  Final   Gram Stain   Final    RARE WBC PRESENT, PREDOMINANTLY  MONONUCLEAR FEW GRAM POSITIVE COCCI IN PAIRS IN CLUSTERS RARE GRAM NEGATIVE RODS Performed at [facility], [place] [address], [place], [place] [address]    Culture   Final    MODERATE METHICILLIN RESISTANT STAPHYLOCOCCUS AUREUS MODERATE HAEMOPHILUS PARAINFLUENZAE BETA LACTAMASE NEGATIVE NO ANAEROBES ISOLATED; CULTURE IN PROGRESS FOR 5 DAYS    Report Status PENDING  Incomplete   Organism ID, Bacteria METHICILLIN RESISTANT STAPHYLOCOCCUS AUREUS  Final      Susceptibility   Methicillin resistant staphylococcus aureus - MIC*    CIPROFLOXACIN 4 RESISTANT Resistant     ERYTHROMYCIN >=8 RESISTANT Resistant     GENTAMICIN <=0.5 SENSITIVE Sensitive     OXACILLIN >=4 RESISTANT Resistant     TETRACYCLINE <=1 SENSITIVE Sensitive     VANCOMYCIN 1 SENSITIVE Sensitive     TRIMETH/SULFA <=10 SENSITIVE Sensitive     CLINDAMYCIN <=0.25 SENSITIVE Sensitive     RIFAMPIN <=0.5 SENSITIVE Sensitive     Inducible Clindamycin NEGATIVE Sensitive     * MODERATE METHICILLIN RESISTANT STAPHYLOCOCCUS AUREUS  Aerobic/Anaerobic Culture w Gram Stain (surgical/deep wound)     Status: None (Preliminary result)   Collection Time: 08/30/22  1:51 PM   Specimen: PATH Other; Tissue   Result Value Ref Range Status   Specimen Description TISSUE  Final   Special Requests LEFT THUMB BONE  Final   Gram Stain   Final    NO WBC SEEN RARE GRAM POSITIVE COCCI IN CLUSTERS Performed at [facility], [address] [place], [place] [address]    Culture   Final    MODERATE STAPHYLOCOCCUS AUREUS SUSCEPTIBILITIES TO FOLLOW FEW HAEMOPHILUS PARAINFLUENZAE BETA LACTAMASE NEGATIVE NO ANAEROBES ISOLATED; CULTURE IN PROGRESS FOR 5 DAYS    Report Status PENDING  Incomplete     Radiology Studies: DG ESOPHAGUS W SINGLE CM (SOL OR THIN BA)  Result Date: 09/01/2022 CLINICAL DATA:  Provided history: Dysphagia. Additional history provided: Patient reports feeling as though foods are becoming stuck at the level of the mid chest. Gastroesophageal reflux. History of esophagitis and esophageal erosions. EXAM: ESOPHAGUS/BARIUM SWALLOW/TABLET STUDY TECHNIQUE: A combined double and single contrast examination was performed using effervescent crystals, high-density barium and thin liquid barium. The exam was performed by Brynda Greathouse PA-C, and was supervised and interpreted by Dr. Kellie Simmering. FLUOROSCOPY TIME:  3 minutes, 30 seconds (35.0 mGy). COMPARISON:  None. FINDINGS: Somewhat limited examination due to the patient's inability to stand and limited ability to reposition on the fluoroscopy table. Apparent focal narrowing within the distal esophagus. A swallowed 13 mm barium tablet did not pass beyond this point despite a prolonged period of observation. Elsewhere, the esophagus appears normal in caliber and smooth in contour. Moderate-to-prominent intermittent esophageal dysmotility with tertiary contraction. Small-volume gastroesophageal reflux observed to the level of the lower esophagus. No appreciable hiatal hernia. IMPRESSION: Apparent focal narrowing within the distal esophagus. A swallowed 13 mm barium tablet did not pass beyond this point despite a prolonged period of observation.  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 Moderate-to-prominent intermittent esophageal dysmotility. Small-volume gastroesophageal reflux to the level of the lower esophagus. Electronically Signed   By: Kellie Simmering D.O.   On: 09/01/2022 14:54   VAS [place] UPPER EXTREMITY ARTERIAL DUPLEX  Result Date: 08/31/2022  UPPER EXTREMITY DUPLEX STUDY Patient Name:  Louis [name]  Date of Exam:   08/31/2022 Medical Rec #: [id number]        Accession #:  [id number] Date of Birth: [birth year]        Patient Gender: M Patient Age:   27 years Exam Location:  [facility] Procedure:      VAS [place] UPPER EXTREMITY ARTERIAL DUPLEX Referring Phys: CHRISTOPHER DICKSON --------------------------------------------------------------------------------  Indications: Bilateral pain, discoloration LT hand > RT. History:     Patient has a history of post left AVF ligation.  Limitations: Bandaging bilaterally Comparison Study: 08-13-2020 Prior upper extremity arterial limited LEFT                   pre-access Performing Technologist: Darlin Coco RDMS, RVT  Examination Guidelines: A complete evaluation includes B-mode imaging, spectral Doppler, color Doppler, and power Doppler as needed of all accessible portions of each vessel. Bilateral testing is considered an integral part of a complete examination. Limited examinations for reoccurring indications may be performed as noted.  Right Doppler Findings: +---------------+----------+-----------+--------+-------------+ Site           PSV (cm/s)Waveform   StenosisComments      +---------------+----------+-----------+--------+-------------+ Subclavian Dist          triphasic                        +---------------+----------+-----------+--------+-------------+ Brachial Prox  68        triphasic          0.59 diameter +---------------+----------+-----------+--------+-------------+ Brachial Mid    90        triphasic          0.44 diameter +---------------+----------+-----------+--------+-------------+ Brachial Dist  75        triphasic          0.50 diameter +---------------+----------+-----------+--------+-------------+ Radial Prox    61        triphasic          0.20 diameter +---------------+----------+-----------+--------+-------------+ Radial Mid     64        triphasic          0.19 diameter +---------------+----------+-----------+--------+-------------+ Radial Dist    67        triphasic          0.20 diameter +---------------+----------+-----------+--------+-------------+ Ulnar Prox     62        triphasic          0.28 diameter +---------------+----------+-----------+--------+-------------+ Ulnar Mid      80        triphasic          0.20 diameter +---------------+----------+-----------+--------+-------------+ Ulnar Dist     105       triphasic          0.22 diameter +---------------+----------+-----------+--------+-------------+ Palmar Arch    41        multiphasic                      +---------------+----------+-----------+--------+-------------+   Left Doppler Findings: +---------------+----------+-----------+--------+--------+ Site           PSV (cm/s)Waveform   StenosisComments +---------------+----------+-----------+--------+--------+ Subclavian Prox68        triphasic                   +---------------+----------+-----------+--------+--------+ Subclavian Mid 71        triphasic                   +---------------+----------+-----------+--------+--------+ Subclavian Dist65        triphasic                   +---------------+----------+-----------+--------+--------+ Axillary  89        triphasic                   +---------------+----------+-----------+--------+--------+ Brachial Prox  67        triphasic                   +---------------+----------+-----------+--------+--------+ Brachial Mid   91         triphasic                   +---------------+----------+-----------+--------+--------+ Brachial Dist  71        triphasic                   +---------------+----------+-----------+--------+--------+ Radial Prox    113       triphasic                   +---------------+----------+-----------+--------+--------+ Radial Mid     95        triphasic                   +---------------+----------+-----------+--------+--------+ Radial Dist    84        triphasic                   +---------------+----------+-----------+--------+--------+ Ulnar Prox     69        triphasic                   +---------------+----------+-----------+--------+--------+ Ulnar Mid      85        triphasic                   +---------------+----------+-----------+--------+--------+ Ulnar Dist     92        triphasic                   +---------------+----------+-----------+--------+--------+ Palmar Arch    37        multiphasic                 +---------------+----------+-----------+--------+--------+   Summary:  Right: No obstruction visualized in the right upper extremity. Left: No obstruction visualized in the left upper extremity. *See table(s) above for measurements and observations. Electronically signed by Orlie Pollen on 08/31/2022 at 8:12:23 PM.    Final    DG CHEST PORT 1 VIEW  Result Date: 08/31/2022 CLINICAL DATA:  Aspiration pneumonia. EXAM: PORTABLE CHEST 1 VIEW COMPARISON:  08/29/2022 FINDINGS: The left IJ dialysis catheter is stable. Stable borderline cardiac enlargement. Persistent left lower lobe infiltrate and left pleural effusion. IMPRESSION: Persistent left lower lobe infiltrate and left pleural effusion. Electronically Signed   By: Marijo Sanes M.D.   On: 08/31/2022 15:32    Scheduled Meds:  apixaban  5 mg Oral BID   Chlorhexidine Gluconate Cloth  6 each Topical Q0600   darbepoetin (ARANESP) injection - DIALYSIS  60 mcg Intravenous Q Thu-HD   furosemide  80  mg Oral BID   insulin aspart  0-24 Units Subcutaneous TID WC   insulin aspart  4 Units Subcutaneous TID WC   insulin detemir  20 Units Subcutaneous Daily   midodrine  10 mg Oral Q T,Th,Sa-HD   mupirocin ointment  1 Application Nasal BID   rosuvastatin  10 mg Oral Daily   saccharomyces boulardii  250 mg Oral BID   sevelamer carbonate  1,600 mg Oral TID WC   Continuous Infusions:  cefTAZidime (FORTAZ)  IV  DAPTOmycin (CUBICIN) 650 mg in sodium chloride 0.9 % IVPB 650 mg (09/01/22 2009)     LOS: 3 days   Darliss Cheney, MD [facility]  09/02/2022, 8:40 AM   *Please note that this is a verbal dictation therefore any spelling or grammatical errors are due to the "[employer]" system interpretation.  Please page via [place] and do not message via secure chat for urgent patient care matters. Secure chat can be used for non urgent patient care matters.  How to contact the [facility] Attending or Consulting provider [place] or covering provider during after hours [place], for this patient?  Check the care team in [facility] and look for a) attending/consulting [facility] provider listed and b) the [facility] team listed. Page or secure chat [phone]. Log into [email or web address] and use [place]'s universal password to access. If you do not have the password, please contact the hospital operator. Locate the [facility] provider you are looking for under [facility] and page to a number that you can be directly reached. If you still have difficulty reaching the provider, please page the [facility] (Director on Call) for the Hospitalists listed on amion for assistance.

## 2022-09-03 DIAGNOSIS — Z7901 Long term (current) use of anticoagulants: Secondary | ICD-10-CM

## 2022-09-03 DIAGNOSIS — M86042 Acute hematogenous osteomyelitis, left hand: Secondary | ICD-10-CM | POA: Diagnosis not present

## 2022-09-03 DIAGNOSIS — R933 Abnormal findings on diagnostic imaging of other parts of digestive tract: Secondary | ICD-10-CM | POA: Diagnosis not present

## 2022-09-03 DIAGNOSIS — R1319 Other dysphagia: Secondary | ICD-10-CM | POA: Diagnosis not present

## 2022-09-03 LAB — GLUCOSE, CAPILLARY
Glucose-Capillary: 133 mg/dL — ABNORMAL HIGH (ref 70–99)
Glucose-Capillary: 136 mg/dL — ABNORMAL HIGH (ref 70–99)
Glucose-Capillary: 124 mg/dL — ABNORMAL HIGH (ref 70–99)
Glucose-Capillary: 214 mg/dL — ABNORMAL HIGH (ref 70–99)

## 2022-09-03 LAB — CULTURE, BLOOD (ROUTINE X 2): Culture: NO GROWTH

## 2022-09-03 NOTE — Progress Notes (Signed)
PROGRESS NOTE    JAQUARIOUS GREY  WUX:[id number] DOB: [birth year] DOA: 08/28/2022 PCP: [name], MD   Brief Narrative:  HPI: Louis [name] is a 76 y.o. with history of ESRD on hemodialysis, type 2 diabetes, CHF, CAD, hyperlipidemia, hypertension presented to the ER at the recommendation of his failure medicine physician.  Patient had left hand swelling abdominal swelling for the last 2 days.  He has noticed bloody drainage starting yesterday so he presented to the ER.  He has had wounds on his hands for a year however never to this severity.  In the ER he had x-rays obtained which demonstrated extensive soft tissue swelling of the left thumb concern for osteomyelitis.  Other labs were obtained which showed CBC WBC 16.5, hemoglobin 10.7, sodium 131, potassium 4.7, creatinine 6.7, lactic acid 1.5, magnesium 3.  Hand surgery was consulted in the ED the patient was admitted for IV antibiotics.  He is allergic to vancomycin so he was given linezolid.  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  Patient underwent left upper arm AV fistula ligation and placement of left IJ TDC by vascular surgery on 08/29/2022.  He then underwent Left thumb flexor sheath incision and drainage by Dr. Caralyn Guile on 08/30/2022.  Plan for surgical procedure on the right hand on Monday, according to  patient. Intraoperative cultures are growing MRSA.  ID on board, currently patient  daptomycin and Fortaz per ID and they recommend continuing these for 6 weeks with dialysis and follow-up with ID in the clinic on 10/04/2022.   Vascular surgery also saw him and per them, he has good blood flow so no angiogram is needed.  ESRD on HD: Nephrology on board.  Hyperlipidemia: Continue Crestor.  Type 2 diabetes mellitus: Blood sugar labile.  Continue Lantus 20 units and SSI.  Acute hyponatremia: We will defer to nephrology since he is a dialysis patient.  Permanent atrial fibrillation: Rates controlled.  He is not on any rate control medications. Orthopedics cleared him to resume Eliquis on 09/01/2022.  However we are going to hold it today for planned surgery tomorrow as well as potential EGD.  Dysphagia: Patient was noted to have some aspiration event on 08/31/2022 with the nurses.  Evaluated by SLP, they suspect esophageal dysphagia and recommended esophagogram which is completed and shows distal esophageal stenosis.  Patient may benefit from EGD and dilatation.  I have consulted GI.    DVT prophylaxis: SCD's Start: 08/30/22 1455 SCDs Start: 08/29/22 0549    Code Status: Full Code  Family Communication: Wife present at bedside.  Plan of care discussed with patient in length and he/she verbalized understanding and agreed with it.  Status is: Inpatient Remains inpatient appropriate because: Needs another surgery in the right hand which is planned next week.     Estimated body mass index is 35.08 kg/m as calculated from  the following:   Height as of this encounter: '5\' 7"'$  (1.702 m).   Weight as of this encounter: 101.6 kg.    Nutritional Assessment: Body mass index is 35.08 kg/m.Marland Kitchen Seen by dietician.  I agree with the assessment and plan as outlined below: Nutrition Status:        . Skin Assessment: I have examined the patient's skin and I agree with the wound assessment as performed  by the wound care RN as outlined below:    Consultants:  Ortho  ID Vascular surgery Nephrology GI  Procedures:  As above  Antimicrobials:  Anti-infectives (From admission, onward)    Start     Dose/Rate Route Frequency Ordered Stop   09/02/22 1200  cefTAZidime (FORTAZ) 2 g in sodium chloride 0.9 % 100 mL IVPB        2 g 200 mL/hr over 30 Minutes Intravenous Every T-Th-Sa (Hemodialysis) 09/01/22 1654     09/01/22 2000  DAPTOmycin (CUBICIN) 650 mg in sodium chloride 0.9 % IVPB        8 mg/kg  81.9 kg (Adjusted) 126 mL/hr over 30 Minutes Intravenous Every 48 hours 08/31/22 0738     09/01/22 1830  cefTAZidime (FORTAZ) 1 g in sodium chloride 0.9 % 100 mL IVPB        1 g 200 mL/hr over 30 Minutes Intravenous  Once 09/01/22 1654 09/01/22 2212   08/31/22 2200  DAPTOmycin (CUBICIN) 650 mg in sodium chloride 0.9 % IVPB  Status:  Discontinued        6 mg/kg  105.7 kg 126 mL/hr over 30 Minutes Intravenous Every 48 hours 08/30/22 1822 08/31/22 0738   08/30/22 1915  DAPTOmycin (CUBICIN) 650 mg in sodium chloride 0.9 % IVPB        6 mg/kg  105.7 kg 126 mL/hr over 30 Minutes Intravenous  Once 08/30/22 1822 08/30/22 2126   08/30/22 1800  cefTRIAXone (ROCEPHIN) 2 g in sodium chloride 0.9 % 100 mL IVPB  Status:  Discontinued        2 g 200 mL/hr over 30 Minutes Intravenous Every 24 hours 08/30/22 1018 09/01/22 0958   08/29/22 1500  ceFAZolin (ANCEF) IVPB 2g/100 mL premix  Status:  Discontinued        2 g 200 mL/hr over 30 Minutes Intravenous On call to O.R. 08/29/22 1454 08/29/22 1705   08/29/22 1430  ceFAZolin (ANCEF) IVPB 2g/100 mL premix        2 g 200 mL/hr over 30 Minutes Intravenous  Once 08/29/22 1418 08/29/22 1456   08/29/22 1417  ceFAZolin (ANCEF) 2-4 GM/100ML-% IVPB       Note to Pharmacy: Cameron Sprang M: cabinet override      08/29/22 1417 08/29/22 1524   08/29/22 1400  piperacillin-tazobactam (ZOSYN) IVPB 2.25 g  Status:  Discontinued        2.25 g 100 mL/hr over 30  Minutes Intravenous Every 8 hours 08/29/22 0501 08/30/22 1018   08/29/22 0500  linezolid (ZYVOX) IVPB 600 mg  Status:  Discontinued        600 mg 300 mL/hr over 60 Minutes Intravenous Every 12 hours 08/29/22 0458 08/30/22 1738   08/29/22 0500  piperacillin-tazobactam (ZOSYN) IVPB 3.375 g        3.375 g 100 mL/hr over 30 Minutes Intravenous  Once 08/29/22 0459 08/29/22 0720         Subjective:  Patient seen and examined.  No complaints.  Wife at the bedside.  Objective: Vitals:   09/02/22 1225  09/02/22 1943 09/03/22 0450 09/03/22 0726  BP: 116/61 111/61 117/66 (!) 109/58  Pulse: (!) 54 72 60 67  Resp: 18   16  Temp: 98.7 F (37.1 C)  98.8 F (37.1 C) 98.3 F (36.8 C)  TempSrc: Oral  Oral   SpO2: 97% 100% 99% 98%  Weight:      Height:        Intake/Output Summary (Last 24 hours) at 09/03/2022 1003 Last data filed at 09/03/2022 0900 Gross per 24 hour  Intake 840 ml  Output 4200 ml  Net -3360 ml    Filed Weights   08/31/22 1222 09/02/22 0749 09/02/22 1122  Weight: 102.8 kg 105.1 kg 101.6 kg    Examination: General exam: Appears calm and comfortable  Respiratory system: Clear to auscultation. Respiratory effort normal. Cardiovascular system: S1 & S2 heard, RRR. No JVD, murmurs, rubs, gallops or clicks. No pedal edema. Gastrointestinal system: Abdomen is nondistended, soft and nontender. No organomegaly or masses felt. Normal bowel sounds heard. Central nervous system: Alert and oriented. No focal neurological deficits. Extremities: Dressing and left thumb, ulcerated multiple fingers in both hands. Psychiatry: Judgement and insight appear normal. Mood & affect appropriate.   Data Reviewed: I have personally reviewed following labs and imaging studies  CBC: Recent Labs  Lab 08/28/22 1630 08/28/22 2323 08/29/22 1420 08/30/22 0817 08/31/22 0800 09/01/22 1120  WBC 16.5* 16.3*  --  12.6* 13.1* 13.8*  NEUTROABS 14,421* 14.4*  --   --   --  12.3*  HGB 10.7* 10.4*  11.9* 10.2* 9.6* 10.6*  HCT 33.9* 33.3* 35.0* 33.4* 30.5* 33.4*  MCV 93.1 95.7  --  95.7 95.9 95.4  PLT 217 215  --  211 206 829    Basic Metabolic Panel: Recent Labs  Lab 08/28/22 2323 08/29/22 1420 08/30/22 0816 08/30/22 0817 08/31/22 0800 09/02/22 0702 09/02/22 0834  NA 131* 127*  --  133* 128* 127* 127*  K 4.7 5.0  --  4.3 4.8 4.6 4.3  CL 90* 90*  --  90* 88* 89* 88*  CO2 27  --   --  '27 25 23 25  '$ GLUCOSE 137* 102*  --  74 208* 117* 112*  BUN 68* 74*  --  42* 64* 76* 75*  CREATININE 6.76* 7.40*  --  4.67* 5.62* 5.92* 5.95*  CALCIUM 8.6*  --   --  8.7* 8.7* 8.6* 8.6*  MG 3.0*  --   --   --   --   --   --   PHOS  --   --  4.9*  --  7.4* 5.9* 5.8*    GFR: Estimated Creatinine Clearance: 12 mL/min (A) (by C-G formula based on SCr of 5.95 mg/dL (H)). Liver Function Tests: Recent Labs  Lab 08/28/22 2323 08/31/22 0800 09/02/22 0702 09/02/22 0834  AST 26  --   --   --   ALT 23  --   --   --   ALKPHOS 248*  --   --   --   BILITOT 1.0  --   --   --   PROT 6.5  --   --   --   ALBUMIN 2.4* 2.0* 2.0* 1.9*    No results for input(s): "LIPASE", "AMYLASE" in the last 168 hours. No results for input(s): "AMMONIA" in the last 168 hours. Coagulation Profile: No results for input(s): "INR", "PROTIME" in the last 168 hours. Cardiac Enzymes: Recent Labs  Lab 08/31/22 0456  CKTOTAL 68    BNP (  last 3 results) No results for input(s): "PROBNP" in the last 8760 hours. HbA1C: No results for input(s): "HGBA1C" in the last 72 hours. CBG: Recent Labs  Lab 09/01/22 2008 09/02/22 1224 09/02/22 1830 09/02/22 1956 09/03/22 0725  GLUCAP 193* 90 173* 156* 133*    Lipid Profile: No results for input(s): "CHOL", "HDL", "LDLCALC", "TRIG", "CHOLHDL", "LDLDIRECT" in the last 72 hours. Thyroid Function Tests: No results for input(s): "TSH", "T4TOTAL", "FREET4", "T3FREE", "THYROIDAB" in the last 72 hours. Anemia Panel: No results for input(s): "VITAMINB12", "FOLATE", "FERRITIN",  "TIBC", "IRON", "RETICCTPCT" in the last 72 hours. Sepsis Labs: Recent Labs  Lab 08/28/22 2323 08/29/22 0539 09/01/22 1120  PROCALCITON  --   --  1.81  LATICACIDVEN 1.5 2.0*  --      Recent Results (from the past 240 hour(s))  WOUND CULTURE     Status: Abnormal   Collection Time: 08/28/22  4:29 PM   Specimen: Thumb; Wound  Result Value Ref Range Status   MICRO NUMBER: [id number]  Final   SPECIMEN QUALITY: Adequate  Final   SOURCE: WOUND (SITE NOT SPECIFIED)  Final   STATUS: FINAL  Final   GRAM STAIN:   Final    No white blood cells seen No epithelial cells seen Many Gram positive cocci in clusters   ISOLATE 1: methicillin resistant Staphylococcus aureus (A)  Final    Comment: Heavy growth of Methicillin resistant Staphylococcus aureus (MRSA) Negative for inducible clindamycin resistance.      Susceptibility   Methicillin resistant staphylococcus aureus - AEROBIC CULT, GRAM STAIN POSITIVE 1    VANCOMYCIN 1 Sensitive     CIPROFLOXACIN >=8 Resistant     CLINDAMYCIN <=0.25 Sensitive     LEVOFLOXACIN 4 Intermediate     ERYTHROMYCIN >=8 Resistant     GENTAMICIN <=0.5 Sensitive     OXACILLIN* NR Resistant      * Oxacillin-resistant staphylococci are resistant to all currently available beta-lactam antimicrobial agents with the possible exception of ceftaroline.     TETRACYCLINE <=1 Sensitive     TRIMETH/SULFA* <=10 Sensitive      * Oxacillin-resistant staphylococci are resistant to all currently available beta-lactam antimicrobial agents with the possible exception of ceftaroline. Legend: S = Susceptible  I = Intermediate R = Resistant  NS = Not susceptible * = Not tested  NR = Not reported **NN = See antimicrobic comments   Blood culture (routine x 2)     Status: None   Collection Time: 08/28/22 11:30 PM   Specimen: BLOOD RIGHT HAND  Result Value Ref Range Status   Specimen Description BLOOD RIGHT HAND  Final   Special Requests   Final    BOTTLES DRAWN AEROBIC AND  ANAEROBIC Blood Culture adequate volume   Culture   Final    NO GROWTH 5 DAYS Performed at [facility], [place] [address] [place], [place] [address]    Report Status 09/02/2022 FINAL  Final  Blood culture (routine x 2)     Status: None (Preliminary result)   Collection Time: 08/29/22  5:40 AM   Specimen: BLOOD RIGHT FOREARM  Result Value Ref Range Status   Specimen Description BLOOD RIGHT FOREARM  Final   Special Requests   Final    BOTTLES DRAWN AEROBIC AND ANAEROBIC Blood Culture results may not be optimal due to an inadequate volume of blood received in culture bottles   Culture   Final    NO GROWTH 4 DAYS Performed at [facility], [place]  [address] [place], [place] [address]    Report Status PENDING  Incomplete  MRSA Next Gen by PCR, Nasal     Status: Abnormal   Collection Time: 08/30/22  3:41 AM   Specimen: Nasal Mucosa; Nasal Swab  Result Value Ref Range Status   MRSA by PCR Next Gen DETECTED (A) NOT DETECTED Final    Comment: CRITICAL RESULT CALLED TO, READ BACK BY AND VERIFIED WITH:  C/ ERIC N., RN 08/30/22 0548 A. LAFRANCE (NOTE) The GeneXpert MRSA Assay (FDA approved for NASAL specimens only), is one component of a comprehensive MRSA colonization surveillance program. It is not intended to diagnose MRSA infection nor to guide or monitor treatment for MRSA infections. Test performance is not FDA approved in patients less than 61 years old. Performed at [facility], [place] [address] [place], [place] [address]   Aerobic/Anaerobic Culture w Gram Stain (surgical/deep wound)     Status: None (Preliminary result)   Collection Time: 08/30/22  1:43 PM   Specimen: PATH Other; Body Fluid  Result Value Ref Range Status   Specimen Description WOUND  Final   Special Requests LEFT THUMB ABSCESS SPEC A  Final   Gram Stain   Final    RARE WBC PRESENT, PREDOMINANTLY MONONUCLEAR FEW GRAM POSITIVE COCCI IN PAIRS IN CLUSTERS RARE GRAM NEGATIVE RODS Performed at [facility], [place] [address] [place], [place] [address]    Culture   Final    MODERATE METHICILLIN RESISTANT STAPHYLOCOCCUS AUREUS MODERATE HAEMOPHILUS PARAINFLUENZAE BETA LACTAMASE NEGATIVE NO ANAEROBES ISOLATED; CULTURE IN PROGRESS FOR 5 DAYS    Report Status PENDING  Incomplete   Organism ID, Bacteria METHICILLIN RESISTANT STAPHYLOCOCCUS AUREUS  Final      Susceptibility   Methicillin resistant staphylococcus aureus - MIC*    CIPROFLOXACIN 4 RESISTANT Resistant     ERYTHROMYCIN >=8 RESISTANT Resistant     GENTAMICIN <=0.5 SENSITIVE Sensitive     OXACILLIN >=4 RESISTANT Resistant     TETRACYCLINE <=1 SENSITIVE Sensitive     VANCOMYCIN 1 SENSITIVE Sensitive     TRIMETH/SULFA <=10 SENSITIVE Sensitive     CLINDAMYCIN <=0.25 SENSITIVE Sensitive     RIFAMPIN <=0.5 SENSITIVE Sensitive     Inducible Clindamycin NEGATIVE Sensitive     * MODERATE METHICILLIN RESISTANT STAPHYLOCOCCUS AUREUS  Aerobic/Anaerobic Culture w Gram Stain (surgical/deep wound)     Status: None (Preliminary result)   Collection Time: 08/30/22  1:51 PM   Specimen: PATH Other; Tissue  Result Value Ref Range Status   Specimen Description TISSUE  Final   Special Requests LEFT THUMB BONE  Final   Gram Stain   Final    NO WBC SEEN RARE GRAM POSITIVE COCCI IN CLUSTERS Performed at [facility], [address] [place],  [address]    Culture   Final    MODERATE METHICILLIN RESISTANT STAPHYLOCOCCUS AUREUS FEW HAEMOPHILUS PARAINFLUENZAE BETA LACTAMASE NEGATIVE NO ANAEROBES ISOLATED; CULTURE IN PROGRESS FOR 5 DAYS    Report Status PENDING  Incomplete   Organism ID, Bacteria METHICILLIN RESISTANT STAPHYLOCOCCUS AUREUS  Final      Susceptibility   Methicillin resistant staphylococcus aureus - MIC*    CIPROFLOXACIN 4 RESISTANT Resistant     ERYTHROMYCIN >=8 RESISTANT Resistant     GENTAMICIN <=0.5 SENSITIVE Sensitive     OXACILLIN >=4 RESISTANT Resistant     TETRACYCLINE <=1 SENSITIVE Sensitive      VANCOMYCIN <=0.5 SENSITIVE Sensitive     TRIMETH/SULFA <=10 SENSITIVE Sensitive  CLINDAMYCIN <=0.25 SENSITIVE Sensitive     RIFAMPIN <=0.5 SENSITIVE Sensitive     Inducible Clindamycin NEGATIVE Sensitive     * MODERATE METHICILLIN RESISTANT STAPHYLOCOCCUS AUREUS     Radiology Studies: DG ESOPHAGUS W SINGLE CM (SOL OR THIN BA)  Result Date: 09/01/2022 CLINICAL DATA:  Provided history: Dysphagia. Additional history provided: Patient reports feeling as though foods are becoming stuck at the level of the mid chest. Gastroesophageal reflux. History of esophagitis and esophageal erosions. EXAM: ESOPHAGUS/BARIUM SWALLOW/TABLET STUDY TECHNIQUE: A combined double and single contrast examination was performed using effervescent crystals, high-density barium and thin liquid barium. The exam was performed by Brynda Greathouse PA-C, and was supervised and interpreted by Dr. Kellie Simmering. FLUOROSCOPY TIME:  3 minutes, 30 seconds (35.0 mGy). COMPARISON:  None. FINDINGS: Somewhat limited examination due to the patient's inability to stand and limited ability to reposition on the fluoroscopy table. Apparent focal narrowing within the distal esophagus. A swallowed 13 mm barium tablet did not pass beyond this point despite a prolonged period of observation. Elsewhere, the esophagus appears normal in caliber and smooth in contour. Moderate-to-prominent intermittent esophageal dysmotility with tertiary contraction. Small-volume gastroesophageal reflux observed to the level of the lower esophagus. No appreciable hiatal hernia. IMPRESSION: Apparent focal narrowing within the distal esophagus. A swallowed 13 mm barium tablet did not pass beyond this point despite a prolonged period of observation. 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 Moderate-to-prominent intermittent esophageal dysmotility. Small-volume gastroesophageal reflux to the level of  the lower esophagus. Electronically Signed   By: Kellie Simmering D.O.   On: 09/01/2022 14:54    Scheduled Meds:  Chlorhexidine Gluconate Cloth  6 each Topical Q0600   darbepoetin (ARANESP) injection - DIALYSIS  60 mcg Intravenous Q Thu-HD   furosemide  80 mg Oral BID   insulin aspart  0-24 Units Subcutaneous TID WC   insulin aspart  4 Units Subcutaneous TID WC   insulin detemir  20 Units Subcutaneous Daily   midodrine  10 mg Oral Q T,Th,Sa-HD   mupirocin ointment  1 Application Nasal BID   rosuvastatin  10 mg Oral Daily   sevelamer carbonate  1,600 mg Oral TID WC   Continuous Infusions:  cefTAZidime (FORTAZ)  IV Stopped (09/02/22 1236)   DAPTOmycin (CUBICIN) 650 mg in sodium chloride 0.9 % IVPB 650 mg (09/01/22 2009)     LOS: 4 days   Darliss Cheney, MD [facility]  09/03/2022, 10:03 AM   *Please note that this is a verbal dictation therefore any spelling or grammatical errors are due to the "[place] [employer]" system interpretation.  Please page via [place] and do not message via secure chat for urgent patient care matters. Secure chat can be used for non urgent patient care matters.  How to contact the [facility] Attending or Consulting provider [place] or covering provider during after hours [place], for this patient?  Check the care team in [facility] and look for a) attending/consulting [facility] provider listed and b) the [facility] team listed. Page or secure chat [phone]. Log into [email or web address] and use [place]'s universal password to access. If you do not have the password, please contact the hospital operator. Locate the [facility] provider you are looking for under [facility] and page to a number that you can be directly reached. If you still have difficulty reaching the provider, please page the [facility] (Director on Call) for the Hospitalists listed on amion for  assistance.

## 2022-09-03 NOTE — Consult Note (Addendum)
Consultation Note   Referring Provider: [facility] PCP: [name], MD Primary Gastroenterologist: [name], MD Reason for consultation: chronic dysphagia.   Hospital Day: 7  Assessment / Plan   # 76 yo male admitted with osteomyelitis of hand  / MRSA who we have been asked to evaluate for dysphagia. He has intermittent solid food dysphagia at home. Barium swallow showing narrowing in distal esophagus.  He will need an EGD for further evaluation and possible dilation. He is still on Eliquis ( got dose this am). I am reaching out to Hospitalist about holding it for EGD.   # Loose stool. Not flagrant diarrhea. May be antibiotic related. If develops significant diarrhea then check for C-diff ( wife gives a history of C-diff one year ago)  # Chronic [place] anemia, stable. Likely related to CKD  # History of upper GI last year per wife [facility]). Unable to find an EGD report.  # GERD. Occasional reflux symptoms at home. Not on reflux medications. Takes Ginger as needed  # Atrial fibrillation, on Eliquis  # Severe hypoalbuminemia, albumin 1.9.   # ESRD on HD  # Hyponatremia, Na 127  #See PMH for additional medical problems   HPI   Louis [name] is a 76 y.o. male with a past medical history significant for  ESRD on hemodialysis, type 2 diabetes, CHF, CAD, hyperlipidemia See PMH for any additional medical problems.  Patient admitted several days ago for osteomyelitis of his hand. We were asked to evaluate him for dysphagia. He apparently had an aspiration event on 10/12. SLP evaluated and suspected esophageal dysphagia. Barium swallow done and showed an apparent focal narrowing within the distal esophagus. The tablet did not pass despite a prolonged period of observation. Additional findings included moderate to prominent intermittent esophageal dysmotility, small volume GERD.  Louis [name] has been having  intermittent solid food dysphagia for about a year. This admission he "choked" on a hamburger. He complains of a lot of phlegm in throat but thinks it may be related to his CPAP machine. He sometimes has reflux / heartburn for which he takes Ginger. Not on PPI or H2 blocker at home.   Louis [name] has a history of C-diff per wife. He has been having some loose stools in the hospital while on antibiotics. No abdominal pain    Previous GI Evaluation    April 2022 colonoscopy for change in stool caliber -Severe diverticulosis in the sigmoid colon and in the descending colon. There was narrowing of the colon in association with the diverticular opening. - Internal hemorrhoids. - The examination was otherwise normal on direct and retroflexion views. - No specimens collected.  Recent Labs and Imaging DG ESOPHAGUS W SINGLE CM (SOL OR THIN BA)  Result Date: 09/01/2022 CLINICAL DATA:  Provided history: Dysphagia. Additional history provided: Patient reports feeling as though foods are becoming stuck at the level of the mid chest. Gastroesophageal reflux. History of esophagitis and esophageal erosions. EXAM: ESOPHAGUS/BARIUM SWALLOW/TABLET STUDY TECHNIQUE: A combined double and single contrast examination was performed using effervescent crystals, high-density barium and thin liquid barium. The exam was performed by Brynda Greathouse PA-C, and was supervised and interpreted by Dr. Kellie Simmering. FLUOROSCOPY TIME:  3 minutes, 30 seconds (  35.0 mGy). COMPARISON:  None. FINDINGS: Somewhat limited examination due to the patient's inability to stand and limited ability to reposition on the fluoroscopy table. Apparent focal narrowing within the distal esophagus. A swallowed 13 mm barium tablet did not pass beyond this point despite a prolonged period of observation. Elsewhere, the esophagus appears normal in caliber and smooth in contour. Moderate-to-prominent intermittent esophageal dysmotility with tertiary contraction.  Small-volume gastroesophageal reflux observed to the level of the lower esophagus. No appreciable hiatal hernia. IMPRESSION: Apparent focal narrowing within the distal esophagus. A swallowed 13 mm barium tablet did not pass beyond this point despite a prolonged period of observation. 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 Moderate-to-prominent intermittent esophageal dysmotility. Small-volume gastroesophageal reflux to the level of the lower esophagus. Electronically Signed   By: Kellie Simmering D.O.   On: 09/01/2022 14:54   VAS [place] UPPER EXTREMITY ARTERIAL DUPLEX  Result Date: 08/31/2022  UPPER EXTREMITY DUPLEX STUDY Patient Name:  Louis [name]  Date of Exam:   08/31/2022 Medical Rec #: [id number]        Accession #:    [id number] Date of Birth: [birth year]        Patient Gender: M Patient Age:   60 years Exam Location:  [place] Procedure:      VAS [place] UPPER EXTREMITY ARTERIAL DUPLEX Referring Phys: Harrell Gave DICKSON --------------------------------------------------------------------------------  Indications: Bilateral pain, discoloration LT hand > RT. History:     Patient has a history of post left AVF ligation.  Limitations: Bandaging bilaterally Comparison Study: 08-13-2020 Prior upper extremity arterial limited LEFT                   pre-access Performing Technologist: Darlin Coco RDMS, RVT  Examination Guidelines: A complete evaluation includes B-mode imaging, spectral Doppler, color Doppler, and power Doppler as needed of all accessible portions of each vessel. Bilateral testing is considered an integral part of a complete examination. Limited examinations for reoccurring indications may be performed as noted.  Right Doppler Findings: +---------------+----------+-----------+--------+-------------+ Site           PSV (cm/s)Waveform   StenosisComments       +---------------+----------+-----------+--------+-------------+ Subclavian Dist          triphasic                        +---------------+----------+-----------+--------+-------------+ Brachial Prox  68        triphasic          0.59 diameter +---------------+----------+-----------+--------+-------------+ Brachial Mid   90        triphasic          0.44 diameter +---------------+----------+-----------+--------+-------------+ Brachial Dist  75        triphasic          0.50 diameter +---------------+----------+-----------+--------+-------------+ Radial Prox    61        triphasic          0.20 diameter +---------------+----------+-----------+--------+-------------+ Radial Mid     64        triphasic          0.19 diameter +---------------+----------+-----------+--------+-------------+ Radial Dist    67        triphasic          0.20 diameter +---------------+----------+-----------+--------+-------------+ Ulnar Prox     62        triphasic          0.28 diameter +---------------+----------+-----------+--------+-------------+ Ulnar Mid  80        triphasic          0.20 diameter +---------------+----------+-----------+--------+-------------+ Ulnar Dist     105       triphasic          0.22 diameter +---------------+----------+-----------+--------+-------------+ Palmar Arch    41        multiphasic                      +---------------+----------+-----------+--------+-------------+   Left Doppler Findings: +---------------+----------+-----------+--------+--------+ Site           PSV (cm/s)Waveform   StenosisComments +---------------+----------+-----------+--------+--------+ Subclavian Prox68        triphasic                   +---------------+----------+-----------+--------+--------+ Subclavian Mid 71        triphasic                   +---------------+----------+-----------+--------+--------+ Subclavian Dist65        triphasic                    +---------------+----------+-----------+--------+--------+ Axillary       89        triphasic                   +---------------+----------+-----------+--------+--------+ Brachial Prox  67        triphasic                   +---------------+----------+-----------+--------+--------+ Brachial Mid   91        triphasic                   +---------------+----------+-----------+--------+--------+ Brachial Dist  71        triphasic                   +---------------+----------+-----------+--------+--------+ Radial Prox    113       triphasic                   +---------------+----------+-----------+--------+--------+ Radial Mid     95        triphasic                   +---------------+----------+-----------+--------+--------+ Radial Dist    84        triphasic                   +---------------+----------+-----------+--------+--------+ Ulnar Prox     69        triphasic                   +---------------+----------+-----------+--------+--------+ Ulnar Mid      85        triphasic                   +---------------+----------+-----------+--------+--------+ Ulnar Dist     92        triphasic                   +---------------+----------+-----------+--------+--------+ Palmar Arch    37        multiphasic                 +---------------+----------+-----------+--------+--------+   Summary:  Right: No obstruction visualized in the right upper extremity. Left: No obstruction visualized in the left upper extremity. *See table(s) above for measurements and observations. Electronically signed by Orlie Pollen on 08/31/2022 at 8:12:23 PM.    Final  DG CHEST PORT 1 VIEW  Result Date: 08/31/2022 CLINICAL DATA:  Aspiration pneumonia. EXAM: PORTABLE CHEST 1 VIEW COMPARISON:  08/29/2022 FINDINGS: The left IJ dialysis catheter is stable. Stable borderline cardiac enlargement. Persistent left lower lobe infiltrate and left pleural effusion.  IMPRESSION: Persistent left lower lobe infiltrate and left pleural effusion. Electronically Signed   By: Marijo Sanes M.D.   On: 08/31/2022 15:32   DG Chest Port 1 View  Result Date: 08/29/2022 CLINICAL DATA:  Placement of dialysis catheter, difficulty breathing EXAM: PORTABLE CHEST 1 VIEW COMPARISON:  10/27/2019 FINDINGS: There is interval placement of left IJ dialysis catheter with its tip in right atrium. Transverse diameter of heart is increased. There is increased density in left lower lung fields suggesting possible moderate sized left pleural effusion and underlying atelectasis/pneumonia. Central pulmonary vessels are prominent. Right lung is clear of any focal infiltrates. There is no pneumothorax. IMPRESSION: Tip of dialysis catheter is seen in the region of right atrium. There is no pneumothorax. Cardiomegaly. Moderate left pleural effusion. Possible atelectasis/pneumonia in left mid and left lower lung fields. Central pulmonary vessels are prominent without signs of alveolar pulmonary edema. Electronically Signed   By: Elmer Picker M.D.   On: 08/29/2022 16:44   DG C-Arm 1-60 Min-No Report  Result Date: 08/29/2022 Fluoroscopy was utilized by the requesting physician.  No radiographic interpretation.   DG Hand Complete Right  Result Date: 08/29/2022 CLINICAL DATA:  Osteomyelitis EXAM: RIGHT HAND - COMPLETE 3+ VIEW COMPARISON:  None Available. FINDINGS: Advanced vascular calcifications are seen within the right hand. There is a superficial wound involving the distal aspect of the ring finger. There are erosive changes involving the subjacent distal tuft of the fourth distal phalanx suggesting osteomyelitis in this location. There are, additionally, probable chronic erosive changes involving the distal tuft of the distal phalanges of the third digit and thumb. This may reflect the sequela of remote or chronic osteomyelitis. Alternatively, vasculopathy such is Raynaud's phenomena or  thermal injuries such as frostbite could result in a similar appearance. Superimposed mild degenerative changes are noted. No acute fracture or dislocation. IMPRESSION: Superficial wound involving the distal aspect of the right ring finger with associated erosive changes involving the subjacent distal tuft of the fourth distal phalanx suggesting osteomyelitis. Additional erosive changes involving the distal tuft of the distal phalanx of the thumb and long finger. See differential considerations above. Peripheral vascular disease. Electronically Signed   By: Fidela Salisbury M.D.   On: 08/29/2022 00:09   DG Finger Thumb Left  Result Date: 08/29/2022 CLINICAL DATA:  Left thumb pain, swelling, throbbing EXAM: LEFT THUMB 2+V COMPARISON:  None Available. FINDINGS: There is extensive soft tissue swelling of the left thumb with foci of subcutaneous gas noted. Superficial ulcer noted involving the distal aspect of the left thumb. There is subjacent erosive changes involving the distal tuft of the distal phalanx in keeping with osteomyelitis in this location. Advanced vascular calcifications are noted. Degenerative changes are noted within the interphalangeal joint and first metacarpophalangeal joint of the thumb. IMPRESSION: Extensive soft tissue swelling of the left thumb with subcutaneous gas and subjacent erosive changes involving the distal tuft of the distal phalanx in keeping with osteomyelitis. Electronically Signed   By: Fidela Salisbury M.D.   On: 08/29/2022 00:04   DG Hand Complete Left  Result Date: 08/29/2022 CLINICAL DATA:  Osteomyelitis EXAM: LEFT HAND - COMPLETE 3+ VIEW COMPARISON:  None Available. FINDINGS: Advanced vascular calcifications are seen within the left hand. There is  diffuse soft tissue swelling of the left hand, most severely involving the thumb, the inner eminence, and dorsum of the left hand. There is subcutaneous gas identified within the soft tissues of the thumb. There is a  superficial wound involving the distal aspect of the thumb. There is subjacent erosive changes involving the distal tuft of the distal phalanx in keeping with osteomyelitis in this location. Erosive changes are noted involving the distal tuft of the index finger with surrounding soft tissue swelling. No acute fracture or dislocation. Remaining joint spaces appear preserved. IMPRESSION: 1. Superficial wound involving the distal aspect of the thumb with subjacent erosive changes involving the distal tuft of the distal phalanx in keeping with osteomyelitis. 2. Subcutaneous gas within the soft tissues of the thumb. 3. Erosive changes involving the distal tuft of the index finger with associated soft tissue swelling in keeping with probable osteomyelitis in this location as well. Electronically Signed   By: Fidela Salisbury M.D.   On: 08/29/2022 00:02    Labs:  Recent Labs    09/01/22 1120  WBC 13.8*  HGB 10.6*  HCT 33.4*  PLT 270   Recent Labs    09/02/22 0702 09/02/22 0834  NA 127* 127*  K 4.6 4.3  CL 89* 88*  CO2 23 25  GLUCOSE 117* 112*  BUN 76* 75*  CREATININE 5.92* 5.95*  CALCIUM 8.6* 8.6*   Recent Labs    09/02/22 0834  ALBUMIN 1.9*   No results for input(s): "HEPBSAG", "HCVAB", "HEPAIGM", "HEPBIGM" in the last 72 hours. No results for input(s): "LABPROT", "INR" in the last 72 hours.  Past Medical History:  Diagnosis Date   Arthritis    Brittle bones    per pt, has soft bones in right foot/wears boot cast!   CAD (coronary artery disease)    Cancer ([place])    skin cancer on arm   Cataract    Bil/ surg scheduled for right eye 01/18/17/ left eye 02/08/17   Charcot ankle, right 2019   CHF (congestive heart failure) ([place]) 2015   Diabetes mellitus    Type 2   ESRD (end stage renal disease) on dialysis ([place])    [facility]   Heart failure, diastolic ([place])    History of kidney stones    Hyperlipidemia    Hypertension    Macular degeneration disease    Macular edema  2014   OSA on CPAP    Paroxysmal atrial fibrillation ([place])    Personal history of colonic polyps - adenomas 01/28/2014   Shortness of breath dyspnea    Syncope and collapse     Past Surgical History:  Procedure Laterality Date   AV FISTULA PLACEMENT Left 09/01/2020   Procedure: LEFT ARM ARTERIOVENOUS (AV) FISTULA;  Surgeon: Waynetta Sandy, MD;  Location: [place];  Service: Vascular;  Laterality: Left;   [place] Left 10/27/2020   Procedure: LEFT SECOND STAGE BASILIC VEIN FISTULA TRANSPOSITION;  Surgeon: Waynetta Sandy, MD;  Location: [place];  Service: Vascular;  Laterality: Left;   CARPAL TUNNEL RELEASE     left hand   COLONOSCOPY     I & D EXTREMITY Left 08/30/2022   Procedure: IRRIGATION AND DEBRIDEMENT LEFT THUMB;  Surgeon: Iran Planas, MD;  Location: [place];  Service: Orthopedics;  Laterality: Left;   INSERTION OF DIALYSIS CATHETER Left 08/29/2022   Procedure: INSERTION OF DIALYSIS CATHETER;  Surgeon: Angelia Mould, MD;  Location: [place];  Service: Vascular;  Laterality: Left;  INTRAVASCULAR PRESSURE WIRE/FFR STUDY N/A 12/18/2018   Procedure: INTRAVASCULAR PRESSURE WIRE/FFR STUDY;  Surgeon: Wellington Hampshire, MD;  Location: [place] [facility];  Service: Cardiovascular;  Laterality: N/A;   IR FLUORO GUIDE CV LINE RIGHT  12/16/2018   IR US GUIDE VASC ACCESS RIGHT  12/16/2018   KNEE ARTHROSCOPY Right 09/13/2016   [facility], Dr. Dorna Leitz   LIGATION OF ARTERIOVENOUS  FISTULA Left 08/29/2022   Procedure: LIGATION OF ARTERIOVENOUS  FISTULA LEFT ARM;  Surgeon: Angelia Mould, MD;  Location: [place];  Service: Vascular;  Laterality: Left;   PILONIDAL CYST EXCISION     RIGHT/LEFT HEART CATH AND CORONARY ANGIOGRAPHY N/A 12/18/2018   Procedure: RIGHT/LEFT HEART CATH AND CORONARY ANGIOGRAPHY;  Surgeon: Wellington Hampshire, MD;  Location: [place] [facility];  Service: Cardiovascular;  Laterality: N/A;    Family History  Problem  Relation Age of Onset   Diabetes Mother    Kidney disease Mother    Diabetes Father    Coronary artery disease Father    Diabetes Brother    Diabetes Sister    Prostate cancer Maternal Uncle    Diabetes Maternal Grandmother    Cancer Paternal Grandmother        unknown   Heart attack Paternal Grandfather    Colon cancer Neg Hx     Prior to Admission medications   Medication Sig Start Date End Date Taking? Authorizing Provider  AMBULATORY NON FORMULARY MEDICATION Continuous positive airway pressure (CPAP) device: Auto titrate minimum 4 cm H20 to maximum of 20 cm H2O with pressure. Please provide all supplemental supplies as needed. Fax to: [phone] 05/30/18  Yes Emeterio Reeve, DO  Cholecalciferol (VITAMIN D3) 50 MCG (2000 UT) TABS Take 2,000 Units by mouth daily.    Yes [provider]  Coenzyme Q10 (COQ10) 200 MG CAPS Take 200 mg by mouth daily.   Yes [provider]  Doxercalciferol (HECTOROL IV) Doxercalciferol (Hectorol) 08/15/22 08/14/23 Yes [provider]  ELIQUIS 5 MG TABS tablet Take 1 tablet (5 mg total) by mouth 2 (two) times daily. 07/07/22  Yes Lelon Perla, MD  furosemide (LASIX) 40 MG tablet Take 80 mg by mouth 2 (two) times daily. 08/02/22  Yes [provider]  iron sucrose in sodium chloride 0.9 % 100 mL Iron Sucrose (Venofer) 08/17/22 09/07/22 Yes [provider]  lidocaine-prilocaine (EMLA) cream SMARTSIG:sparingly Topical As Directed 04/10/22  Yes [provider]  Methoxy PEG-Epoetin Beta (MIRCERA IJ) Mircera 08/01/22 07/31/23 Yes [provider]  nitroGLYCERIN (NITROSTAT) 0.4 MG SL tablet Place 1 tablet (0.4 mg total) under the tongue every 5 (five) minutes as needed for chest pain. 12/29/20  Yes Lelon Perla, MD  NOVOLIN N 100 UNIT/ML injection Inject 10 Units into the skin 2 (two) times daily before a meal. 12/05/16  Yes [provider]  NOVOLIN R 100 UNIT/ML injection Inject 1-15 Units  into the skin 3 (three) times daily with meals. Sliding Scale 02/10/16  Yes [provider]  omega-3 acid ethyl esters (LOVAZA) 1 g capsule Take 1 g by mouth daily.   Yes [provider]  Probiotic Product (PROBIOTIC DAILY PO) Take 420 mg by mouth daily.   Yes [provider]  rosuvastatin (CRESTOR) 40 MG tablet Take 1 tablet by mouth once daily Patient taking differently: Take 40 mg by mouth daily. 05/02/22  Yes Lelon Perla, MD  silver sulfADIAZINE (SILVADENE) 1 % cream Apply 1 Application topically daily as needed (wound care,  itching). 02/28/22  Yes [provider]  Vitamin Mixture (VITAMIN E COMPLETE PO) Take 1 capsule by mouth daily. Vitamin E complex   Yes [provider]  ZINC SULFATE PO Take 1 tablet by mouth daily.    Yes [provider]  AMBULATORY NON FORMULARY MEDICATION CPAP supplies - Disposable filters (white), Replaceable pillow or cushions, Mask and Tubing as needed. 01/13/21   [name], MD  B-D INS SYR ULTRAFINE 1CC/31G 31G X 5/16" 1 ML MISC  02/06/17   [provider]  blood glucose meter kit and supplies Use to check blood sugars daily E11.65 05/22/16   [provider]  Continuous Blood Gluc Receiver (FREESTYLE LIBRE 14 DAY READER) DEVI Dx DM E11.22 Check blood sugar 4 times daily. 06/14/20   [name], MD  Continuous Blood Gluc Sensor (FREESTYLE LIBRE 14 DAY SENSOR) MISC Dx DM E11.22 Check blood sugar 4 times daily. 05/15/22   [name], MD  midodrine (PROAMATINE) 10 MG tablet Take 10 mg by mouth as directed. Take 30 min. Before dialysis treatment 08/24/22   [provider]  [facility] ULTRA test strip USE 1 STRIP TO CHECK GLUCOSE THREE TIMES DAILY 05/15/22   [name], MD    Current Facility-Administered Medications  Medication Dose Route Frequency Provider Last Rate Last Admin   acetaminophen (TYLENOL) tablet 650 mg  650 mg Oral Q6H PRN Gabriel Earing, PA-C   650 mg at 08/29/22 6803   Or   acetaminophen (TYLENOL) suppository 650 mg  650 mg Rectal Q6H PRN Rhyne, Samantha J, PA-C       alum & mag hydroxide-simeth (MAALOX/MYLANTA) 200-200-20 MG/5ML suspension 30 mL  30 mL Oral Q4H PRN Pahwani, Ravi, MD   30 mL at 09/03/22 0029   cefTAZidime (FORTAZ) 2 g in sodium chloride 0.9 % 100 mL IVPB  2 g Intravenous Q T,Th,Sa-HD Darliss Cheney, MD   Stopped at 09/02/22 1236   Chlorhexidine Gluconate Cloth 2 % PADS 6 each  6 each Topical Q0600 Gabriel Earing, PA-C   6 each at 09/03/22 0823   DAPTOmycin (CUBICIN) 650 mg in sodium chloride 0.9 % IVPB  8 mg/kg (Adjusted) Intravenous Q48H Rolla Flatten, RPH 126 mL/hr at 09/01/22 2009 650 mg at 09/01/22 2009   Darbepoetin Alfa (ARANESP) injection 60 mcg  60 mcg Intravenous Q Thu-HD Tobie Poet E, NP   60 mcg at 08/31/22 1031   furosemide (LASIX) tablet 80 mg  80 mg Oral BID Darliss Cheney, MD   80 mg at 09/03/22 0821   guaiFENesin (MUCINEX) 12 hr tablet 600 mg  600 mg Oral BID PRN Darliss Cheney, MD       HYDROmorphone (DILAUDID) injection 0.5 mg  0.5 mg Intravenous Q4H PRN Darliss Cheney, MD   0.5 mg at 09/02/22 2052   insulin aspart (novoLOG) injection 0-24 Units  0-24 Units Subcutaneous TID WC Rhyne, Samantha J, PA-C   2 Units at 09/03/22 2122   insulin aspart (novoLOG) injection 4 Units  4 Units Subcutaneous TID WC Rhyne, Samantha J, PA-C   4 Units at 09/03/22 0823   insulin detemir (LEVEMIR) injection 20 Units  20 Units Subcutaneous Daily Gabriel Earing, PA-C   20 Units at 09/03/22 4825   midodrine (PROAMATINE) tablet 10 mg  10 mg Oral Q T,Th,Sa-HD Rhyne, Samantha J, PA-C   10 mg at 09/02/22 1244   mupirocin ointment (BACTROBAN) 2 % 1 Application  1 Application Nasal BID Shelly Coss, MD  1 Application at 93/81/82 0823   ondansetron (ZOFRAN) injection 4 mg  4 mg Intravenous Q6H PRN Darliss Cheney, MD   4 mg at 08/30/22 0932   rosuvastatin (CRESTOR) tablet 10 mg  10 mg Oral Daily Darliss Cheney, MD   10 mg at 09/03/22 9937   sevelamer carbonate (RENVELA) tablet 1,600 mg  1,600 mg Oral TID WC Ernest Haber, PA-C   1,600 mg at 09/03/22 1696   traMADol (ULTRAM) tablet 50 mg  50 mg Oral Q12H PRN Gabriel Earing, PA-C   50 mg at 08/31/22 1037    Allergies as of 08/28/2022 - Review Complete 08/28/2022  Allergen Reaction Noted   Hydrocodone Nausea And Vomiting 07/27/2015   Oxycodone Nausea And Vomiting 07/27/2015   Vancomycin Shortness Of Breath 07/09/2021   Dacarbazine Other (See Comments) 09/08/2019   Tape Dermatitis, Itching, and Rash 07/23/2019    Social History   Socioeconomic History   Marital status: Married    Spouse name: [name]   Number of children: 5   Years of education: 12   Highest education level: 12th grade  Occupational History   Occupation: Warehouse    Comment: retired  Tobacco Use   Smoking status: Former    Packs/day: 0.50    Years: 20.00    Total pack years: 10.00    Types: Cigarettes    Quit date: 03/06/1977    Years since quitting: 45.5   Smokeless tobacco: Never  Vaping Use   Vaping Use: Never used  Substance and Sexual Activity   Alcohol use: No   Drug use: No   Sexual activity: Not Currently  Other Topics Concern   Not on file  Social History Narrative   Lives with his wife. She is his caretaker. They have 18 grandchildren. He enjoys watching sports as he is not able to do much due to neuropathy.   Social Determinants of Health   Financial Resource Strain: Low Risk  (03/27/2022)   Overall Financial Resource Strain (CARDIA)    Difficulty of Paying Living Expenses: Not hard at all  Food Insecurity: No Food Insecurity (08/29/2022)   Hunger Vital Sign    Worried About Running Out of Food in the Last Year: Never true    Ran Out of Food in the Last Year: Never true  Transportation Needs: No Transportation Needs (08/29/2022)   PRAPARE - Hydrologist (Medical): No    Lack of Transportation (Non-Medical): No   Physical Activity: Inactive (03/27/2022)   Exercise Vital Sign    Days of Exercise per Week: 0 days    Minutes of Exercise per Session: 0 min  Stress: No Stress Concern Present (03/27/2022)   [place]    Feeling of Stress : Not at all  Social Connections: Moderately Integrated (03/27/2022)   Social Connection and Isolation Panel [NHANES]    Frequency of Communication with Friends and Family: Twice a week    Frequency of Social Gatherings with Friends and Family: Once a week    Attends Religious Services: More than 4 times per year    Active Member of [employer] or Organizations: No    Attends Archivist Meetings: Never    Marital Status: Married  Human resources officer Violence: Not At Risk (08/29/2022)   Humiliation, Afraid, Rape, and Kick questionnaire    Fear of Current or Ex-Partner: No    Emotionally Abused: No    Physically Abused: No  Sexually Abused: No    Review of Systems: All systems reviewed and negative except where noted in HPI.  Physical Exam: Vital signs in last 24 hours: Temp:  [98.3 F (36.8 C)-98.8 F (37.1 C)] 98.3 F (36.8 C) (10/15 0726) Pulse Rate:  [54-72] 67 (10/15 0726) Resp:  [14-18] 16 (10/15 0726) BP: (106-117)/(58-66) 109/58 (10/15 0726) SpO2:  [90 %-100 %] 98 % (10/15 0726) Weight:  [101.6 kg] 101.6 kg (10/14 1122) Last BM Date : 09/02/22  General:  Alert male in NAD Psych:  Pleasant, cooperative. Normal mood and affect Eyes: Pupils equal Ears:  Normal auditory acuity Nose: No deformity, discharge or lesions Neck:  Supple, no masses felt Lungs:  Clear to auscultation.  Heart:  Regular rate, 1-2 BLE edema Abdomen:  Soft, moderately distended, tympanitic, nontender, a few "metallic" bowel sounds. ectal :  Deferred Msk: Symmetrical without gross deformities.  Neurologic:  Alert, oriented, grossly normal neurologically Skin:  Intact without significant lesions.     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 I agree with the Advanced Practitioner's note, impression and recommendations, and have recorded additional findings, impressions and recommendations below. I performed a substantive portion of this encounter (>50% time spent), including a complete performance of the medical decision making.  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  He also has some laryngeal symptoms with buildup of mucus and cough.  His wife says he has had more reflux symptoms of heartburn regurgitation and belching since a prolonged hospitalization for C. difficile at another hospital earlier this year.  He is here for osteomyelitis in the hand and he will have further surgery tomorrow.  Is on [place] and last dose was this morning.  We will check back with him after surgery and determine appropriate timing of upper endoscopy with possible endoscopic dilation, but it would be best for him to get it done prior to discharge.  He also has antibiotic related diarrhea and history of C.  difficile.  Infectious disease service has been following him, will leave it to their discretion and that of internal medicine and they feel may be necessary to check for C. difficile.  Dr. Fuller Plan will take over the consult service starting tomorrow.   Nelida Meuse III Office:[phone]

## 2022-09-03 NOTE — Progress Notes (Signed)
PLAN FOR RETURN TO OR TOMORROW FOR REPEAT I/D AND POSSIBLE IP AMPUTATION. SURGERY WILL BE LATER TOMORROW  PREOP ORDERS PLACED [place] WITH INPATIENT CARE

## 2022-09-03 NOTE — Progress Notes (Signed)
Subjective: Seen in room, no complaints, said tolerated 4 L UF HD yesterday, Monday 10/16 for another right hand surgery/noted some reported dysphagia admit team SLP /GI work-up   Objective Vital signs in last 24 hours: Vitals:   09/02/22 1225 09/02/22 1943 09/03/22 0450 09/03/22 0726  BP: 116/61 111/61 117/66 (!) 109/58  Pulse: (!) 54 72 60 67  Resp: 18   16  Temp: 98.7 F (37.1 C)  98.8 F (37.1 C) 98.3 F (36.8 C)  TempSrc: Oral  Oral   SpO2: 97% 100% 99% 98%  Weight:      Height:       Weight change:   Physical Exam: General: Alert, elderly male NAD Heart: RRR, no MRG Lungs: CTA nonlabored breathing Abdomen: NABS, soft, ND NT Extremities: Trace bipedal edema, left thumb dressing dry clear, both hands multiple finger dry ulcers Dialysis Access: Left IJ TDC, left UA aVF no bruit status post ligation   OP dialysis Orders: TTS SW 4h 450/ 500  101 kg  3K/2.5 bath  Hep none LUA AVF - last HD post HD 101.6kg on 10/7 - last Hb 10.0 10/5 - mircera 50 q 2, last 9/26 - venofer 100 mg tiw IV thru 10/19 - doxercalciferol 5 ug ttw IV   Home meds include - eliquis, furosemide 40 qd, isordil 20, midodrine 10 prn, novolin insulin, rosuvastatin, vits/ supps / prns  Problem/Plan: ESRD= HD TTS on schedule Volume overload= midodrine 10 mg TTS predialysis ordered recently and tolerating UF, 4 L UF 10/12 and tolerated 10/14 4 L UF HD, now at dry weight  hyponatremia, Na 127 Pre HD 10/14, follow-up lab trend anticipate improvement with UF on HD Osteomyelitis left hand/thumb status post left arm AV fistula ligation/ left thumb flexor sheath I&D , bone curettage and debridement of osteomyelitis of the distal phalanx by Dr. Apolonio Schneiders (Hand ortho)= noted per ID antibiotic plan now daptomycin and Fortaz  6wk total follow-up in ID clinic on 10/04/2022 and per [facility] has adequate blood flow no angiogram needed, further hand surgery 10/16 Dysphagia/distal esophageal narrowing (on barium swallow study )= GI  seeing today DM2= plan per admit team Anemia of ESRD -Hgb 10.6 (10/13 )<9.6 (10/12 )Aranesp 60 q. Thursday weekly/no iron with infection Secondary hyperparathyroidism -phosphorus 5.8 <7.4, C ca  in goal Renvela 800 increased 1600 mg, needs carb modified renal diet change this a.m.   Ernest Haber, PA-C [facility] [phone] 09/03/2022,12:20 PM  LOS: 4 days   Labs: Basic Metabolic Panel: Recent Labs  Lab 08/31/22 0800 09/02/22 0702 09/02/22 0834  NA 128* 127* 127*  K 4.8 4.6 4.3  CL 88* 89* 88*  CO2 '25 23 25  '$ GLUCOSE 208* 117* 112*  BUN 64* 76* 75*  CREATININE 5.62* 5.92* 5.95*  CALCIUM 8.7* 8.6* 8.6*  PHOS 7.4* 5.9* 5.8*   Liver Function Tests: Recent Labs  Lab 08/28/22 2323 08/31/22 0800 09/02/22 0702 09/02/22 0834  AST 26  --   --   --   ALT 23  --   --   --   ALKPHOS 248*  --   --   --   BILITOT 1.0  --   --   --   PROT 6.5  --   --   --   ALBUMIN 2.4* 2.0* 2.0* 1.9*   No results for input(s): "LIPASE", "AMYLASE" in the last 168 hours. No results for input(s): "AMMONIA" in the last 168 hours. CBC: Recent Labs  Lab 08/28/22 1630 08/28/22 2323 08/29/22  1420 08/30/22 0817 08/31/22 0800 09/01/22 1120  WBC 16.5* 16.3*  --  12.6* 13.1* 13.8*  NEUTROABS 14,421* 14.4*  --   --   --  12.3*  HGB 10.7* 10.4*   < > 10.2* 9.6* 10.6*  HCT 33.9* 33.3*   < > 33.4* 30.5* 33.4*  MCV 93.1 95.7  --  95.7 95.9 95.4  PLT 217 215  --  211 206 270   < > = values in this interval not displayed.   Cardiac Enzymes: Recent Labs  Lab 08/31/22 0456  CKTOTAL 68   CBG: Recent Labs  Lab 09/01/22 2008 09/02/22 1224 09/02/22 1830 09/02/22 1956 09/03/22 0725  GLUCAP 193* 90 173* 156* 133*    Studies/Results: DG ESOPHAGUS W SINGLE CM (SOL OR THIN BA)  Result Date: 09/01/2022 CLINICAL DATA:  Provided history: Dysphagia. Additional history provided: Patient reports feeling as though foods are becoming stuck at the level of the mid chest.  Gastroesophageal reflux. History of esophagitis and esophageal erosions. EXAM: ESOPHAGUS/BARIUM SWALLOW/TABLET STUDY TECHNIQUE: A combined double and single contrast examination was performed using effervescent crystals, high-density barium and thin liquid barium. The exam was performed by Brynda Greathouse PA-C, and was supervised and interpreted by Dr. Kellie Simmering. FLUOROSCOPY TIME:  3 minutes, 30 seconds (35.0 mGy). COMPARISON:  None. FINDINGS: Somewhat limited examination due to the patient's inability to stand and limited ability to reposition on the fluoroscopy table. Apparent focal narrowing within the distal esophagus. A swallowed 13 mm barium tablet did not pass beyond this point despite a prolonged period of observation. Elsewhere, the esophagus appears normal in caliber and smooth in contour. Moderate-to-prominent intermittent esophageal dysmotility with tertiary contraction. Small-volume gastroesophageal reflux observed to the level of the lower esophagus. No appreciable hiatal hernia. IMPRESSION: Apparent focal narrowing within the distal esophagus. A swallowed 13 mm barium tablet did not pass beyond this point despite a prolonged period of observation. 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 Moderate-to-prominent intermittent esophageal dysmotility. Small-volume gastroesophageal reflux to the level of the lower esophagus. Electronically Signed   By: Kellie Simmering D.O.   On: 09/01/2022 14:54   Medications:  cefTAZidime (FORTAZ)  IV Stopped (09/02/22 1236)   DAPTOmycin (CUBICIN) 650 mg in sodium chloride 0.9 % IVPB 650 mg (09/01/22 2009)    Chlorhexidine Gluconate Cloth  6 each Topical Q0600   darbepoetin (ARANESP) injection - DIALYSIS  60 mcg Intravenous Q Thu-HD   furosemide  80 mg Oral BID   insulin aspart  0-24 Units Subcutaneous TID WC   insulin aspart  4 Units Subcutaneous TID WC   insulin detemir  20 Units  Subcutaneous Daily   midodrine  10 mg Oral Q T,Th,Sa-HD   mupirocin ointment  1 Application Nasal BID   rosuvastatin  10 mg Oral Daily   sevelamer carbonate  1,600 mg Oral TID WC

## 2022-09-03 NOTE — Plan of Care (Signed)
  Problem: Pain Managment: Goal: General experience of comfort will improve Outcome: Progressing   Problem: Safety: Goal: Ability to remain free from injury will improve Outcome: Progressing   Problem: Skin Integrity: Goal: Risk for impaired skin integrity will decrease Outcome: Progressing   

## 2022-09-04 ENCOUNTER — Ambulatory Visit: Payer: Medicare Other | Admitting: Family Medicine

## 2022-09-04 ENCOUNTER — Encounter (HOSPITAL_COMMUNITY): Payer: Self-pay | Admitting: Family Medicine

## 2022-09-04 ENCOUNTER — Other Ambulatory Visit: Payer: Self-pay

## 2022-09-04 ENCOUNTER — Inpatient Hospital Stay (HOSPITAL_COMMUNITY): Payer: Medicare Other | Admitting: Anesthesiology

## 2022-09-04 ENCOUNTER — Encounter (HOSPITAL_COMMUNITY)
Admission: EM | Disposition: A | Discharge status: Skilled Nursing Facility | Payer: Self-pay | Source: Home / Self Care | Attending: Family Medicine

## 2022-09-04 DIAGNOSIS — R933 Abnormal findings on diagnostic imaging of other parts of digestive tract: Secondary | ICD-10-CM | POA: Diagnosis not present

## 2022-09-04 DIAGNOSIS — Z794 Long term (current) use of insulin: Secondary | ICD-10-CM

## 2022-09-04 DIAGNOSIS — E1169 Type 2 diabetes mellitus with other specified complication: Secondary | ICD-10-CM | POA: Diagnosis not present

## 2022-09-04 DIAGNOSIS — M869 Osteomyelitis, unspecified: Secondary | ICD-10-CM

## 2022-09-04 DIAGNOSIS — G4730 Sleep apnea, unspecified: Secondary | ICD-10-CM | POA: Diagnosis not present

## 2022-09-04 DIAGNOSIS — M86042 Acute hematogenous osteomyelitis, left hand: Secondary | ICD-10-CM | POA: Diagnosis not present

## 2022-09-04 DIAGNOSIS — Z87891 Personal history of nicotine dependence: Secondary | ICD-10-CM

## 2022-09-04 DIAGNOSIS — R1310 Dysphagia, unspecified: Secondary | ICD-10-CM

## 2022-09-04 HISTORY — PX: I & D EXTREMITY: SHX5045

## 2022-09-04 LAB — AEROBIC/ANAEROBIC CULTURE W GRAM STAIN (SURGICAL/DEEP WOUND): Gram Stain: NONE SEEN

## 2022-09-04 LAB — BASIC METABOLIC PANEL
Anion gap: 14 (ref 5–15)
BUN: 72 mg/dL — ABNORMAL HIGH (ref 8–23)
CO2: 24 mmol/L (ref 22–32)
Calcium: 8.4 mg/dL — ABNORMAL LOW (ref 8.9–10.3)
Chloride: 91 mmol/L — ABNORMAL LOW (ref 98–111)
Creatinine, Ser: 6.18 mg/dL — ABNORMAL HIGH (ref 0.61–1.24)
GFR, Estimated: 9 mL/min — ABNORMAL LOW (ref >60)
Glucose, Bld: 158 mg/dL — ABNORMAL HIGH (ref 70–99)
Potassium: 4.2 mmol/L (ref 3.5–5.1)
Sodium: 129 mmol/L — ABNORMAL LOW (ref 135–145)

## 2022-09-04 LAB — CBC
HCT: 33.6 % — ABNORMAL LOW (ref 39.0–52.0)
Hemoglobin: 10.3 g/dL — ABNORMAL LOW (ref 13.0–17.0)
MCH: 28.8 pg (ref 26.0–34.0)
MCHC: 30.7 g/dL (ref 30.0–36.0)
MCV: 93.9 fL (ref 80.0–100.0)
Platelets: 210 10*3/uL (ref 150–400)
RBC: 3.58 MIL/uL — ABNORMAL LOW (ref 4.22–5.81)
RDW: 18.7 % — ABNORMAL HIGH (ref 11.5–15.5)
WBC: 14.3 10*3/uL — ABNORMAL HIGH (ref 4.0–10.5)
nRBC: 0 % (ref 0.0–0.2)

## 2022-09-04 LAB — GLUCOSE, CAPILLARY
Glucose-Capillary: 169 mg/dL — ABNORMAL HIGH (ref 70–99)
Glucose-Capillary: 182 mg/dL — ABNORMAL HIGH (ref 70–99)
Glucose-Capillary: 149 mg/dL — ABNORMAL HIGH (ref 70–99)
Glucose-Capillary: 113 mg/dL — ABNORMAL HIGH (ref 70–99)
Glucose-Capillary: 130 mg/dL — ABNORMAL HIGH (ref 70–99)
Glucose-Capillary: 122 mg/dL — ABNORMAL HIGH (ref 70–99)
Glucose-Capillary: 314 mg/dL — ABNORMAL HIGH (ref 70–99)

## 2022-09-04 SURGERY — IRRIGATION AND DEBRIDEMENT EXTREMITY
Anesthesia: General | Laterality: Left

## 2022-09-04 MED ORDER — PHENYLEPHRINE 80 MCG/ML (10ML) SYRINGE FOR IV PUSH (FOR BLOOD PRESSURE SUPPORT)
PREFILLED_SYRINGE | INTRAVENOUS | Status: AC
  Filled 2022-09-04: qty 10

## 2022-09-04 MED ORDER — INSULIN ASPART 100 UNIT/ML IJ SOLN
10.0000 [IU] | Freq: Once | INTRAMUSCULAR | Status: AC
  Administered 2022-09-04: 10 [IU] via SUBCUTANEOUS

## 2022-09-04 MED ORDER — ONDANSETRON HCL 4 MG/2ML IJ SOLN
INTRAMUSCULAR | Status: AC
  Filled 2022-09-04: qty 2

## 2022-09-04 MED ORDER — FENTANYL CITRATE (PF) 250 MCG/5ML IJ SOLN
INTRAMUSCULAR | Status: DC | PRN
  Administered 2022-09-04: 25 ug via INTRAVENOUS

## 2022-09-04 MED ORDER — ORAL CARE MOUTH RINSE: 15.0000 mL | Freq: Once | OROMUCOSAL | Status: AC

## 2022-09-04 MED ORDER — PHENYLEPHRINE 80 MCG/ML (10ML) SYRINGE FOR IV PUSH (FOR BLOOD PRESSURE SUPPORT)
PREFILLED_SYRINGE | INTRAVENOUS | Status: DC | PRN
  Administered 2022-09-04: 120 ug via INTRAVENOUS
  Administered 2022-09-04: 80 ug via INTRAVENOUS

## 2022-09-04 MED ORDER — CHLORHEXIDINE GLUCONATE 0.12 % MT SOLN: 15.0000 mL | Freq: Once | OROMUCOSAL | Status: AC

## 2022-09-04 MED ORDER — LIDOCAINE 2% (20 MG/ML) 5 ML SYRINGE
INTRAMUSCULAR | Status: DC | PRN
  Administered 2022-09-04: 80 mg via INTRAVENOUS

## 2022-09-04 MED ORDER — EPHEDRINE 5 MG/ML INJ
INTRAVENOUS | Status: AC
  Filled 2022-09-04: qty 5

## 2022-09-04 MED ORDER — ONDANSETRON HCL 4 MG/2ML IJ SOLN
INTRAMUSCULAR | Status: DC | PRN
  Administered 2022-09-04: 4 mg via INTRAVENOUS

## 2022-09-04 MED ORDER — CHLORHEXIDINE GLUCONATE CLOTH 2 % EX PADS
6.0000 | MEDICATED_PAD | Freq: Every day | CUTANEOUS | Status: DC
  Administered 2022-09-04 – 2022-09-07 (×5): 6 via TOPICAL

## 2022-09-04 MED ORDER — DEXAMETHASONE SODIUM PHOSPHATE 10 MG/ML IJ SOLN
INTRAMUSCULAR | Status: AC
  Filled 2022-09-04: qty 1

## 2022-09-04 MED ORDER — PROPOFOL 10 MG/ML IV BOLUS
INTRAVENOUS | Status: DC | PRN
  Administered 2022-09-04: 30 mg via INTRAVENOUS
  Administered 2022-09-04: 90 mg via INTRAVENOUS

## 2022-09-04 MED ORDER — DEXAMETHASONE SODIUM PHOSPHATE 10 MG/ML IJ SOLN
INTRAMUSCULAR | Status: DC | PRN
  Administered 2022-09-04: 4 mg via INTRAVENOUS

## 2022-09-04 MED ORDER — LIDOCAINE 2% (20 MG/ML) 5 ML SYRINGE
INTRAMUSCULAR | Status: AC
  Filled 2022-09-04: qty 5

## 2022-09-04 MED ORDER — CHLORHEXIDINE GLUCONATE 0.12 % MT SOLN
OROMUCOSAL | Status: AC
  Administered 2022-09-04: 15 mL via OROMUCOSAL
  Filled 2022-09-04: qty 15

## 2022-09-04 MED ORDER — EPHEDRINE SULFATE-NACL 50-0.9 MG/10ML-% IV SOSY
PREFILLED_SYRINGE | INTRAVENOUS | Status: DC | PRN
  Administered 2022-09-04: 2.5 mg via INTRAVENOUS

## 2022-09-04 MED ORDER — SODIUM CHLORIDE 0.9 % IR SOLN
Status: DC | PRN
  Administered 2022-09-04: 1

## 2022-09-04 MED ORDER — FENTANYL CITRATE (PF) 250 MCG/5ML IJ SOLN
INTRAMUSCULAR | Status: AC
  Filled 2022-09-04: qty 5

## 2022-09-04 MED ORDER — ACETAMINOPHEN 500 MG PO TABS
1000.0000 mg | ORAL_TABLET | Freq: Once | ORAL | Status: AC
  Administered 2022-09-04: 1000 mg via ORAL
  Filled 2022-09-04: qty 2

## 2022-09-04 MED ORDER — SODIUM CHLORIDE 0.9 % IV SOLN: INTRAVENOUS | Status: DC

## 2022-09-04 SURGICAL SUPPLY — 60 items
BAG COUNTER SPONGE SURGICOUNT (BAG) ×2 IMPLANT
BAG SPNG CNTER NS LX DISP (BAG) ×1
BNDG CMPR 9X4 STRL LF SNTH (GAUZE/BANDAGES/DRESSINGS) ×1
BNDG CMPR MD 5X2 ELC HKLP STRL (GAUZE/BANDAGES/DRESSINGS) ×1
BNDG COHESIVE 1X5 TAN STRL LF (GAUZE/BANDAGES/DRESSINGS) IMPLANT
BNDG ELASTIC 2 VLCR STRL LF (GAUZE/BANDAGES/DRESSINGS) IMPLANT
BNDG ELASTIC 3X5.8 VLCR STR LF (GAUZE/BANDAGES/DRESSINGS) ×2 IMPLANT
BNDG ELASTIC 4X5.8 VLCR STR LF (GAUZE/BANDAGES/DRESSINGS) ×2 IMPLANT
BNDG ESMARK 4X9 LF (GAUZE/BANDAGES/DRESSINGS) ×2 IMPLANT
BNDG GAUZE DERMACEA FLUFF 4 (GAUZE/BANDAGES/DRESSINGS) ×2 IMPLANT
BNDG GZE DERMACEA 4 6PLY (GAUZE/BANDAGES/DRESSINGS) ×1
CORD BIPOLAR FORCEPS 12FT (ELECTRODE) ×2 IMPLANT
COVER SURGICAL LIGHT HANDLE (MISCELLANEOUS) ×2 IMPLANT
CUFF TOURN SGL QUICK 18X4 (TOURNIQUET CUFF) ×2 IMPLANT
CUFF TOURN SGL QUICK 24 (TOURNIQUET CUFF)
CUFF TRNQT CYL 24X4X16.5-23 (TOURNIQUET CUFF) IMPLANT
DRAPE SURG 17X23 STRL (DRAPES) ×2 IMPLANT
DRSG ADAPTIC 3X8 NADH LF (GAUZE/BANDAGES/DRESSINGS) ×2 IMPLANT
DRSG EMULSION OIL 3X3 NADH (GAUZE/BANDAGES/DRESSINGS) IMPLANT
ELECT REM PT RETURN 9FT ADLT (ELECTROSURGICAL)
ELECTRODE REM PT RTRN 9FT ADLT (ELECTROSURGICAL) IMPLANT
GAUZE SPONGE 2X2 8PLY STRL LF (GAUZE/BANDAGES/DRESSINGS) IMPLANT
GAUZE SPONGE 4X4 12PLY STRL (GAUZE/BANDAGES/DRESSINGS) ×2 IMPLANT
GAUZE XEROFORM 1X8 LF (GAUZE/BANDAGES/DRESSINGS) ×2 IMPLANT
GAUZE XEROFORM 5X9 LF (GAUZE/BANDAGES/DRESSINGS) IMPLANT
GLOVE BIOGEL PI IND STRL 8.5 (GLOVE) ×2 IMPLANT
GLOVE SURG ORTHO 8.0 STRL STRW (GLOVE) ×2 IMPLANT
GOWN STRL REUS W/ TWL LRG LVL3 (GOWN DISPOSABLE) ×6 IMPLANT
GOWN STRL REUS W/ TWL XL LVL3 (GOWN DISPOSABLE) ×2 IMPLANT
GOWN STRL REUS W/TWL LRG LVL3 (GOWN DISPOSABLE) ×3
GOWN STRL REUS W/TWL XL LVL3 (GOWN DISPOSABLE) ×1
HANDPIECE INTERPULSE COAX TIP (DISPOSABLE)
KIT BASIN OR (CUSTOM PROCEDURE TRAY) ×2 IMPLANT
KIT TURNOVER KIT B (KITS) ×2 IMPLANT
MANIFOLD NEPTUNE II (INSTRUMENTS) ×2 IMPLANT
NDL HYPO 25GX1X1/2 BEV (NEEDLE) IMPLANT
NEEDLE HYPO 25GX1X1/2 BEV (NEEDLE) IMPLANT
NS IRRIG 1000ML POUR BTL (IV SOLUTION) ×2 IMPLANT
PACK ORTHO EXTREMITY (CUSTOM PROCEDURE TRAY) ×2 IMPLANT
PAD ARMBOARD 7.5X6 YLW CONV (MISCELLANEOUS) ×4 IMPLANT
PAD CAST 4YDX4 CTTN HI CHSV (CAST SUPPLIES) ×2 IMPLANT
PADDING CAST COTTON 4X4 STRL (CAST SUPPLIES) ×1
SET CYSTO W/LG BORE CLAMP LF (SET/KITS/TRAYS/PACK) IMPLANT
SET HNDPC FAN SPRY TIP SCT (DISPOSABLE) IMPLANT
SOAP 2 % CHG 4 OZ (WOUND CARE) ×2 IMPLANT
SPONGE T-LAP 18X18 ~~LOC~~+RFID (SPONGE) ×2 IMPLANT
SPONGE T-LAP 4X18 ~~LOC~~+RFID (SPONGE) ×2 IMPLANT
SUT ETHILON 4 0 PS 2 18 (SUTURE) IMPLANT
SUT ETHILON 5 0 P 3 18 (SUTURE)
SUT NYLON ETHILON 5-0 P-3 1X18 (SUTURE) IMPLANT
SUT PROLENE 4 0 PS 2 18 (SUTURE) IMPLANT
SWAB COLLECTION DEVICE MRSA (MISCELLANEOUS) ×2 IMPLANT
SWAB CULTURE ESWAB REG 1ML (MISCELLANEOUS) IMPLANT
SYR CONTROL 10ML LL (SYRINGE) IMPLANT
TOWEL GREEN STERILE (TOWEL DISPOSABLE) ×2 IMPLANT
TOWEL GREEN STERILE FF (TOWEL DISPOSABLE) ×2 IMPLANT
TUBE CONNECTING 12X1/4 (SUCTIONS) ×2 IMPLANT
UNDERPAD 30X36 HEAVY ABSORB (UNDERPADS AND DIAPERS) ×2 IMPLANT
WATER STERILE IRR 1000ML POUR (IV SOLUTION) ×2 IMPLANT
YANKAUER SUCT BULB TIP NO VENT (SUCTIONS) ×2 IMPLANT

## 2022-09-04 NOTE — Progress Notes (Signed)
R/B/A DISCUSSED WITH PT IN HOSPITAL.  PT VOICED UNDERSTANDING OF PLAN CONSENT SIGNED DAY OF SURGERY PT SEEN AND EXAMINED PRIOR TO OPERATIVE PROCEDURE/DAY OF SURGERY SITE MARKED. QUESTIONS ANSWERED WILL REMAIN AN INPATIENT FOLLOWING SURGERY 

## 2022-09-04 NOTE — Anesthesia Procedure Notes (Addendum)
Procedure Name: LMA Insertion Date/Time: 09/04/2022 1:34 PM  Performed by: Janene Harvey, CRNAPre-anesthesia Checklist: Patient identified, Emergency Drugs available, Suction available and Patient being monitored Patient Re-evaluated:Patient Re-evaluated prior to induction Oxygen Delivery Method: Circle system utilized Preoxygenation: Pre-oxygenation with 100% oxygen Induction Type: IV induction LMA: LMA inserted LMA Size: 5.0 Placement Confirmation: positive ETCO2 Tube secured with: Tape Dental Injury: Teeth and Oropharynx as per pre-operative assessment

## 2022-09-04 NOTE — Progress Notes (Signed)
[facility] Progress Note   Subjective:   Patient seen and examined at bedside with wife.  Feeling ok this AM.  Admits to pain in L hand, acid reflux, hiccups and LE edema.  Denies CP, SOB, orthopnea, abdominal pain and n/v/d. Plan for L hand surgery this afternoon and EGD tomorrow. States he does not ever want another fistula.   Objective Vitals:   09/03/22 1100 09/03/22 1559 09/03/22 2103 09/04/22 0445  BP: (!) 112/55 (!) 110/51 (!) 117/57 125/71  Pulse: (!) 104 64 95 79  Resp: '16 14 16 16  '$ Temp: 98.6 F (37 C) 98.5 F (36.9 C) 98.3 F (36.8 C) (!) 97.4 F (36.3 C)  TempSrc: Oral Oral Oral Oral  SpO2: 100% 100% 98% 97%  Weight:      Height:       Physical Exam General:chronically ill appearing male in NAD Heart:RRR, no mrg Lungs:mostly CTAB, nml WOB on RA Abdomen:soft, NTND Extremities:2+ edema on RLE, 1+ on LLE, L thumb wrapped Dialysis Access: [facility]   Filed Weights   08/31/22 1222 09/02/22 0749 09/02/22 1122  Weight: 102.8 kg 105.1 kg 101.6 kg    Intake/Output Summary (Last 24 hours) at 09/04/2022 1038 Last data filed at 09/04/2022 0700 Gross per 24 hour  Intake 783 ml  Output 300 ml  Net 483 ml    Additional Objective Labs: Basic Metabolic Panel: Recent Labs  Lab 08/31/22 0800 09/02/22 0702 09/02/22 0834  NA 128* 127* 127*  K 4.8 4.6 4.3  CL 88* 89* 88*  CO2 '25 23 25  '$ GLUCOSE 208* 117* 112*  BUN 64* 76* 75*  CREATININE 5.62* 5.92* 5.95*  CALCIUM 8.7* 8.6* 8.6*  PHOS 7.4* 5.9* 5.8*   Liver Function Tests: Recent Labs  Lab 08/28/22 2323 08/31/22 0800 09/02/22 0702 09/02/22 0834  AST 26  --   --   --   ALT 23  --   --   --   ALKPHOS 248*  --   --   --   BILITOT 1.0  --   --   --   PROT 6.5  --   --   --   ALBUMIN 2.4* 2.0* 2.0* 1.9*   CBC: Recent Labs  Lab 08/28/22 1630 08/28/22 2323 08/29/22 1420 08/30/22 0817 08/31/22 0800 09/01/22 1120  WBC 16.5* 16.3*  --  12.6* 13.1* 13.8*  NEUTROABS 14,421* 14.4*  --   --    --  12.3*  HGB 10.7* 10.4*   < > 10.2* 9.6* 10.6*  HCT 33.9* 33.3*   < > 33.4* 30.5* 33.4*  MCV 93.1 95.7  --  95.7 95.9 95.4  PLT 217 215  --  211 206 270   < > = values in this interval not displayed.   Blood Culture    Component Value Date/Time   SDES TISSUE 08/30/2022 1351   SPECREQUEST LEFT THUMB BONE 08/30/2022 1351   CULT  08/30/2022 1351    MODERATE METHICILLIN RESISTANT STAPHYLOCOCCUS AUREUS FEW HAEMOPHILUS PARAINFLUENZAE BETA LACTAMASE NEGATIVE NO ANAEROBES ISOLATED; CULTURE IN PROGRESS FOR 5 DAYS    REPTSTATUS PENDING 08/30/2022 1351    Cardiac Enzymes: Recent Labs  Lab 08/31/22 0456  CKTOTAL 68   CBG: Recent Labs  Lab 09/03/22 1229 09/03/22 1603 09/03/22 1953 09/04/22 0820 09/04/22 0954  GLUCAP 136* 124* 214* 169* 182*    Medications:  cefTAZidime (FORTAZ)  IV Stopped (09/02/22 1236)   DAPTOmycin (CUBICIN) 650 mg in sodium chloride 0.9 % IVPB 650 mg (09/03/22 1952)  Chlorhexidine Gluconate Cloth  6 each Topical Q0600   darbepoetin (ARANESP) injection - DIALYSIS  60 mcg Intravenous Q Thu-HD   furosemide  80 mg Oral BID   insulin aspart  0-24 Units Subcutaneous TID WC   insulin aspart  4 Units Subcutaneous TID WC   insulin detemir  20 Units Subcutaneous Daily   midodrine  10 mg Oral Q T,Th,Sa-HD   rosuvastatin  10 mg Oral Daily   sevelamer carbonate  1,600 mg Oral TID WC    Dialysis Orders: TTS SW 4h 450/ 500  101 kg  3K/2.5 bath  Hep none LUA AVF - last HD post HD 101.6kg on 10/7 - last Hb 10.0 10/5 - mircera 50 q 2, last 9/26 - venofer 100 mg tiw IV thru 10/19 - doxercalciferol 5 ug ttw IV   Home meds include - eliquis, furosemide 40 qd, isordil 20, midodrine 10 prn, novolin insulin, rosuvastatin, vits/ supps / prns   Problem/Plan: ESRD- on HD TTS.  Plan for HD tomorrow per regular schedule completed around scheduled procedure.   Volume overload- midodrine 10 mg TTS predialysis ordered recently. Tolerating UF 4L each HD here.   +hyponatremia, improved with HD.  Remains volume overloaded, continue max UF as tolerated. May need dry weight lowered on d/c. Osteomyelitis left hand/thumb status post left arm AV fistula ligation/ left thumb flexor sheath I&D , bone curettage and debridement of osteomyelitis of the distal phalanx by Dr. Apolonio Schneiders (Hand ortho)- noted per ID antibiotic plan now daptomycin and Fortaz  6wk total follow-up in ID clinic on 10/04/2022 and per [facility] has adequate blood flow no angiogram needed.  I&D scheduled today w/possible amputation.  Dysphagia/distal esophageal narrowing (on barium swallow study )- EGD w/esophageal dilation tomorrow. Per GI.  DM2- plan per admit team Anemia of ESRD -Hgb 10.6 (10/13 )<9.6 (10/12 )Aranesp 60 q Thursday weekly/no iron with infection Secondary hyperparathyroidism -phosphorus close to goal. CCa in goal.  Renvela 800 increased 1600 mg, changed to renal diet yesterday. Nutrition - Renal diet w/fluid restrictions once advanced.  Permanent A Fib - Eliquis on hold.   Jen Mow, PA-C [facility] 09/04/2022,10:38 AM  LOS: 5 days

## 2022-09-04 NOTE — Progress Notes (Signed)
Physical Therapy Treatment Patient Details Name: Louis [name] MRN: [id number] DOB: January 31, 1946 Today's Date: 09/04/2022   History of Present Illness Patient is a 76 y/o male who presents on 10/9 with drainage, pain and swelling of left hand/thumb. 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 Plan for further I&D 10/16 and possible amputation. PMH includes CAD,. CHF, DM, ESRD on HD, HTN, paroxysmal A-fib, macular degeneration.    PT Comments    Patient progressing slowly towards PT goals. Session focused on transfers and gait training with use of RW for support. Requires Mod-max A of 2 to stand depending on surface height and min A of 2 for gait training with chair follow due to fatigue. Pt with posterior bias in standing. Plan for surgery today. Wife and pt still hopeful to work towards getting home but d/c recommendation updated in case pt does not progress well enough for wife to care for him at home. Pt/wife asking for wider light transport w/c to help transport pt to appts. Will follow and progress as tolerated.    Recommendations for follow up therapy are one component of a multi-disciplinary discharge planning process, led by the attending physician.  Recommendations may be updated based on patient status, additional functional criteria and insurance authorization.  Follow Up Recommendations  Skilled nursing-short term rehab (<3 hours/day) (but hopeful to work towards home) Can patient physically be transported by private vehicle: No   Assistance Recommended at Discharge Frequent or constant Supervision/Assistance  Patient can return home with the following Two people to help with walking and/or transfers;A lot of help with bathing/dressing/bathroom;Assistance with cooking/housework;Assist for transportation;Assistance with feeding   Equipment Recommendations  None recommended by PT     Recommendations for Other Services       Precautions / Restrictions Precautions Precautions: Fall Restrictions Weight Bearing Restrictions: No     Mobility  Bed Mobility Overal bed mobility: Needs Assistance Bed Mobility: Supine to Sit     Supine to sit: Mod assist, HOB elevated     General bed mobility comments: Increased time and assist with trunk and scooting bottom to EOB, use of rail.    Transfers Overall transfer level: Needs assistance Equipment used: Rolling walker (2 wheels) Transfers: Sit to/from Stand, Bed to chair/wheelchair/BSC Sit to Stand: Mod assist, +2 physical assistance, Max assist   Step pivot transfers: +2 safety/equipment, Min assist       General transfer comment: Mod-Max A of 2 depending on surface height, from EOB x1, from chair x1. Cues for hand placement/foot placement and technique. Posterior bias once upright needing cues to bring feet under CoM.    Ambulation/Gait Ambulation/Gait assistance: Min assist, Mod assist, +2 safety/equipment Gait Distance (Feet): 4 Feet (x2 bouts) Assistive device: Rolling walker (2 wheels) Gait Pattern/deviations: Step-to pattern, Step-through pattern, Decreased step length - right, Decreased step length - left, Narrow base of support, Leaning posteriorly Gait velocity: decreased Gait velocity interpretation: <1.31 ft/sec, indicative of household ambulator   General Gait Details: Slow, step to gait with narrow BoS and posterior bias with cues for anterior weight shift and RW proximity. 1 seated rest break.   Stairs             Wheelchair Mobility    Modified Rankin (Stroke Patients Only)       Balance Overall balance assessment: Needs assistance Sitting-balance support: Feet supported, No upper extremity supported Sitting balance-Leahy Scale: Fair Sitting balance -  Comments: supervision for safety.   Standing balance support: During functional activity, Bilateral upper extremity  supported Standing balance-Leahy Scale: Poor Standing balance comment: Requires UE support and external support for standing balance due to posterior lean.                            Cognition Arousal/Alertness: Awake/alert Behavior During Therapy: WFL for tasks assessed/performed Overall Cognitive Status: Impaired/Different from baseline Area of Impairment: Problem solving                             Problem Solving: Slow processing, Decreased initiation, Difficulty sequencing, Requires verbal cues, Requires tactile cues General Comments: Slow processing, flat affect.        Exercises      General Comments General comments (skin integrity, edema, etc.): Wife present during session.      Pertinent Vitals/Pain Pain Assessment Pain Assessment: Faces Faces Pain Scale: Hurts little more Pain Location: bil hands Pain Descriptors / Indicators: Sore, Discomfort, Operative site guarding Pain Intervention(s): Monitored during session, Repositioned    Home Living                          Prior Function            PT Goals (current goals can now be found in the care plan section) Progress towards PT goals: Progressing toward goals    Frequency    Min 3X/week      PT Plan Discharge plan needs to be updated    Co-evaluation              AM-PAC PT "6 Clicks" Mobility   Outcome Measure  Help needed turning from your back to your side while in a flat bed without using bedrails?: A Lot Help needed moving from lying on your back to sitting on the side of a flat bed without using bedrails?: A Lot Help needed moving to and from a bed to a chair (including a wheelchair)?: A Lot Help needed standing up from a chair using your arms (e.g., wheelchair or bedside chair)?: A Lot Help needed to walk in hospital room?: Total Help needed climbing 3-5 steps with a railing? : Total 6 Click Score: 10    End of Session Equipment Utilized During  Treatment: Gait belt Activity Tolerance: Patient tolerated treatment well Patient left: in chair;with call bell/phone within reach;with chair alarm set;with family/visitor present Nurse Communication: Mobility status PT Visit Diagnosis: Pain;Muscle weakness (generalized) (M62.81);Difficulty in walking, not elsewhere classified (R26.2);Unsteadiness on feet (R26.81) Pain - Right/Left:  (bil) Pain - part of body: Hand     Time: 1034-1101 PT Time Calculation (min) (ACUTE ONLY): 27 min  Charges:  $Gait Training: 8-22 mins $Therapeutic Activity: 8-22 mins                     Marisa Severin, PT, DPT Acute Rehabilitation Services Secure chat preferred Office [place] 09/04/2022, 12:34 PM

## 2022-09-04 NOTE — Progress Notes (Signed)
Changed dressing twice to left thumb. Saturated both times, MD aware.

## 2022-09-04 NOTE — Anesthesia Postprocedure Evaluation (Signed)
Anesthesia Post Note  Patient: Louis [name]  Procedure(s) Performed: IRRIGATION AND DEBRIDEMENT LEFT THUMB POSSIBLE AMPUTATION (Left)     Patient location during evaluation: PACU Anesthesia Type: General Level of consciousness: awake and alert Pain management: pain level controlled Vital Signs Assessment: post-procedure vital signs reviewed and stable Respiratory status: spontaneous breathing, nonlabored ventilation, respiratory function stable and patient connected to nasal cannula oxygen Cardiovascular status: blood pressure returned to baseline and stable Postop Assessment: no apparent nausea or vomiting Anesthetic complications: no   No notable events documented.  Last Vitals:  Vitals:   09/04/22 1515 09/04/22 1748  BP: 118/71   Pulse: (!) 50 98  Resp: 13   Temp: 36.4 C   SpO2: 99%     Last Pain:  Vitals:   09/04/22 1644  TempSrc:   PainSc: [name]

## 2022-09-04 NOTE — Progress Notes (Signed)
Patient has some bleeding through his dressing, dressing reinforced with ABD pads for management of drainage and the provider was made aware. Will continue to monitor

## 2022-09-04 NOTE — Op Note (Signed)
PREOPERATIVE DIAGNOSIS: Left thumb osteomyelitis  POSTOPERATIVE DIAGNOSIS: Same   ATTENDING SURGEON: Dr. Iran Planas who scrubbed and present for the entire procedure  ASSISTANT SURGEON: Gertie Fey, PA-C was scrubbed necessary for debridement closure and splinting in a timely fashion  ANESTHESIA: General via LMA  OPERATIVE PROCEDURE: Open debridement of skin subcutaneous tissue and bone associated with osteomyelitis left thumb excisional debridement Left thumb amputation through the level of the proximal phalanx with local neurectomies and advancement flap closure  IMPLANTS: None  EBL: Minimal  RADIOGRAPHIC INTERPRETATION: None  SURGICAL INDICATIONS: The patient is a longstanding hemodialysis diabetic with worsening infection to the left thumb.  Patient underwent initial irrigation debridement return to the operating room for further evaluation possible amputation.  Risk benefits and alternatives were discussed in detail with the patient and signed informed consent was obtained the day of surgery.  SURGICAL TECHNIQUE: The patient was prepped identified in the preoperative holding area marked apart a marker made the left thumb indicate correct operative site.  Patient brought back to operating placed supine on the anesthesia table where the general anesthetic was administered.  Patient tolerates well.  A well-padded tourniquet placed on the left brachium and sealed with the appropriate drape.  Left upper extremities then prepped and draped normal sterile fashion.  A timeout was called the correct site was identified procedure then begun.  Attention was then turned to the left thumb.  Excisional debridement of the devitalized tissue was then carried out.  This was done sharply with a knife rondure and curettes.  The patient did have the very significant necrotic tissue over the dorsal aspect of the thumb tip as well as the volar margin.  This was not salvageable.  With the necrotic tissue  this was taken back to healthy what appeared to be healthy appearing or more healthy appearing tissue.  The wound was then thoroughly irrigated.  After debridement attention was then turned to amputation.  Amputation was then carried out through the level of the proximal phalanx just proximal to the articular surface to allow for adequate skin closure.  Local neurectomies were then carried out.  Following this the skin flaps were then advanced in a VY fashion allowing for adequate closure over the amputation site.  Advancement flap closure was done with simple Prolene sutures.  Adaptic dressing a sterile compressive bandage then applied.  The patient tolerated the procedure well returned to recovery room in good condition.  POSTOPERATIVE PLAN: The patient is going to continue with the inpatient care be discharged as per the primary medical team.  I will need to see him back in the office in 1 week for wound check.  Keep the bandage clean and dry the entire time do not remove.  We will see him back in 1 week the patient may require further debridement just depending on the vascularity and the healing potential of the wound with a very guarded prognosis given the end-stage diabetes and hemodialysis.  Continue to follow him closely oral antibiotic and osteomyelitis treatment as per the primary team and infectious disease.

## 2022-09-04 NOTE — Progress Notes (Signed)
Mobility Specialist Progress Note   09/04/22 1244  Mobility  Activity Transferred from chair to bed  Level of Assistance +2 (takes two people)  Assistive Device  (HHA)  Distance Ambulated (ft) 2 ft  Activity Response Tolerated well  $Mobility charge 1 Mobility   Family requesting assistance to get pt from chair to bed for an upcoming procedure. +2A for safety but minA throughout with use of HHA for stability. Pt left in bed with all needs met awaiting transport.  Holland Falling Mobility Specialist MS [facility] #:  [phone] Acute Rehab Office:  [phone]

## 2022-09-04 NOTE — H&P (View-Only) (Signed)
Daily Rounding Note  09/04/2022, 9:45 AM  LOS: 5 days   SUBJECTIVE:   Chief complaint:  Dysphagia.     Having about 2 loose stools daily.  His norm is 2 formed stools daily. Tolerating renal diet. N.p.o. this morning to allow for hand surgery today.  OBJECTIVE:         Vital signs in last 24 hours:    Temp:  [97.4 F (36.3 C)-98.6 F (37 C)] 97.4 F (36.3 C) (10/16 0445) Pulse Rate:  [64-104] 79 (10/16 0445) Resp:  [14-16] 16 (10/16 0445) BP: (110-125)/(51-71) 125/71 (10/16 0445) SpO2:  [97 %-100 %] 97 % (10/16 0445) Last BM Date : 09/02/22 Filed Weights   08/31/22 1222 09/02/22 0749 09/02/22 1122  Weight: 102.8 kg 105.1 kg 101.6 kg   General: Looks chronically ill.  Obese, bloated. Heart: RRR. Chest: No labored breathing or cough Abdomen: Soft, somewhat protuberant, nontender. Extremities: Swelling bruising on both arms.  Hand, thumb wrapped on the left. Neuro/Psych: Laconic but alert and engaged.  Most of the conversation was with the patient's wife.  Intake/Output from previous day: 10/15 0701 - 10/16 0700 In: 1023 [P.O.:960; IV Piggyback:63] Out: 300 [Urine:300]  Intake/Output this shift: No intake/output data recorded.  Lab Results: Recent Labs    09/01/22 1120  WBC 13.8*  HGB 10.6*  HCT 33.4*  PLT 270   BMET Recent Labs    09/02/22 0702 09/02/22 0834  NA 127* 127*  K 4.6 4.3  CL 89* 88*  CO2 23 25  GLUCOSE 117* 112*  BUN 76* 75*  CREATININE 5.92* 5.95*  CALCIUM 8.6* 8.6*   LFT Recent Labs    09/02/22 0702 09/02/22 0834  ALBUMIN 2.0* 1.9*    Studies/Results: No results found.  Scheduled Meds:  Chlorhexidine Gluconate Cloth  6 each Topical Q0600   darbepoetin (ARANESP) injection - DIALYSIS  60 mcg Intravenous Q Thu-HD   furosemide  80 mg Oral BID   insulin aspart  0-24 Units Subcutaneous TID WC   insulin aspart  4 Units Subcutaneous TID WC   insulin detemir  20 Units  Subcutaneous Daily   midodrine  10 mg Oral Q T,Th,Sa-HD   mupirocin ointment  1 Application Nasal BID   rosuvastatin  10 mg Oral Daily   sevelamer carbonate  1,600 mg Oral TID WC   Continuous Infusions:  cefTAZidime (FORTAZ)  IV Stopped (09/02/22 1236)   DAPTOmycin (CUBICIN) 650 mg in sodium chloride 0.9 % IVPB 650 mg (09/03/22 1952)   PRN Meds:.acetaminophen **OR** acetaminophen, alum & mag hydroxide-simeth, guaiFENesin, HYDROmorphone (DILAUDID) injection, ondansetron (ZOFRAN) IV, traMADol   ASSESMENT:     Solid food dysphagia.  Occ reflux sxs at home.  Narrow distal esophagus on esophagram.  GERD at [facility] last year 07/2021: erosive esophagitis.  Gastric ileus, concern for gastroparesis in 07/2021.  No PPI etc at home     A fib, chronic Eliquis.  Last dose, was AM 10/15.  No held.      Osteomyelitis, MRSA of hand. Daptomycin, Fortaz in place.        ESRD, on HD.    Hyponatremia.      Hypoalbuminemia.    Loose but not frequent stools in the setting of antibiotics.  C diff in 06/2021.  Current level of diarrhea not consistent with C. difficile.   Anemia.  Got 1 U RBC in 07/2021.      OSA   PLAN  EGD w esoph dilation tomorrow, will be off eliquis 48 h at that point.  Will work out the timing of this around his dialysis which is due tomorrow.  Hand surgery today.   Louis [name]  09/04/2022, 9:45 AM Phone [phone]   Attending Physician Note   I have taken an interval history, reviewed the chart and examined the patient. I performed more than 50% of this encounter in conjunction with the APP. I agree with the APP's note, impression and recommendations with my edits. My additional impressions and recommendations are as follows.   *Dysphagia with distal esophageal stricture on BA esophagram, GERD *Afib on Eliquis *ESRD on HD *Left hand osteomyelitis   *EGD/dilation tomorrow *Hand surgery today *Eliquis on hold   Louis Edward, MD [facility] See [facility], [place]  [facility], for our on call provider

## 2022-09-04 NOTE — Anesthesia Preprocedure Evaluation (Addendum)
Anesthesia Evaluation  Patient identified by MRN, date of birth, ID band Patient awake    Reviewed: Allergy & Precautions, Patient's Chart, lab work & pertinent test results  History of Anesthesia Complications Negative for: history of anesthetic complications  Airway Mallampati: II  TM Distance: >3 FB Neck ROM: Full    Dental  (+) Chipped,    Pulmonary sleep apnea , former smoker,    Pulmonary exam normal        Cardiovascular hypertension, Pt. on medications + CAD and +CHF  Normal cardiovascular exam+ dysrhythmias Atrial Fibrillation   TTE 2020: moderate LVH, EF 65-70%, moderate LAE, PASP 11mHg    Neuro/Psych negative neurological ROS  negative psych ROS   GI/Hepatic negative GI ROS, Neg liver ROS,   Endo/Other  diabetes, Type 2, Insulin Dependent  Renal/GU Dialysis and ESRFRenal disease (HD Tu/Th/Sa)  negative genitourinary   Musculoskeletal  (+) Arthritis ,   Abdominal   Peds  Hematology negative hematology ROS (+)   Anesthesia Other Findings Day of surgery medications reviewed with patient.  Reproductive/Obstetrics negative OB ROS                             Anesthesia Physical Anesthesia Plan  ASA: 3  Anesthesia Plan: General   Post-op Pain Management:    Induction: Intravenous  PONV Risk Score and Plan: 2 and Treatment may vary due to age or medical condition, Ondansetron and Dexamethasone  Airway Management Planned: LMA  Additional Equipment: None  Intra-op Plan:   Post-operative Plan: Extubation in OR  Informed Consent: I have reviewed the patients History and Physical, chart, labs and discussed the procedure including the risks, benefits and alternatives for the proposed anesthesia with the patient or authorized representative who has indicated his/her understanding and acceptance.     Dental advisory given  Plan Discussed with: CRNA  Anesthesia Plan  Comments:        Anesthesia Quick Evaluation

## 2022-09-04 NOTE — Transfer of Care (Signed)
Immediate Anesthesia Transfer of Care Note  Patient: Louis [name]  Procedure(s) Performed: IRRIGATION AND DEBRIDEMENT LEFT THUMB POSSIBLE AMPUTATION (Left)  Patient Location: PACU  Anesthesia Type:General  Level of Consciousness: drowsy and patient cooperative  Airway & Oxygen Therapy: Patient Spontanous Breathing and Patient connected to face mask oxygen  Post-op Assessment: Report given to RN and Post -op Vital signs reviewed and stable  Post vital signs: Reviewed and stable  Last Vitals:  Vitals Value Taken Time  BP 121/67 09/04/22 1420  Temp    Pulse 62 09/04/22 1424  Resp 15 09/04/22 1424  SpO2 99 % 09/04/22 1424  Vitals shown include unvalidated device data.  Last Pain:  Vitals:   09/04/22 1257  TempSrc:   PainSc: 6       Patients Stated Pain Goal: 2 (17/40/81 4481)  Complications: No notable events documented.

## 2022-09-04 NOTE — Progress Notes (Addendum)
Daily Rounding Note  09/04/2022, 9:45 AM  LOS: 5 days   SUBJECTIVE:   Chief complaint:  Dysphagia.     Having about 2 loose stools daily.  His norm is 2 formed stools daily. Tolerating renal diet. N.p.o. this morning to allow for hand surgery today.  OBJECTIVE:         Vital signs in last 24 hours:    Temp:  [97.4 F (36.3 C)-98.6 F (37 C)] 97.4 F (36.3 C) (10/16 0445) Pulse Rate:  [64-104] 79 (10/16 0445) Resp:  [14-16] 16 (10/16 0445) BP: (110-125)/(51-71) 125/71 (10/16 0445) SpO2:  [97 %-100 %] 97 % (10/16 0445) Last BM Date : 09/02/22 Filed Weights   08/31/22 1222 09/02/22 0749 09/02/22 1122  Weight: 102.8 kg 105.1 kg 101.6 kg   General: Looks chronically ill.  Obese, bloated. Heart: RRR. Chest: No labored breathing or cough Abdomen: Soft, somewhat protuberant, nontender. Extremities: Swelling bruising on both arms.  Hand, thumb wrapped on the left. Neuro/Psych: Laconic but alert and engaged.  Most of the conversation was with the patient's wife.  Intake/Output from previous day: 10/15 0701 - 10/16 0700 In: 1023 [P.O.:960; IV Piggyback:63] Out: 300 [Urine:300]  Intake/Output this shift: No intake/output data recorded.  Lab Results: Recent Labs    09/01/22 1120  WBC 13.8*  HGB 10.6*  HCT 33.4*  PLT 270   BMET Recent Labs    09/02/22 0702 09/02/22 0834  NA 127* 127*  K 4.6 4.3  CL 89* 88*  CO2 23 25  GLUCOSE 117* 112*  BUN 76* 75*  CREATININE 5.92* 5.95*  CALCIUM 8.6* 8.6*   LFT Recent Labs    09/02/22 0702 09/02/22 0834  ALBUMIN 2.0* 1.9*    Studies/Results: No results found.  Scheduled Meds:  Chlorhexidine Gluconate Cloth  6 each Topical Q0600   darbepoetin (ARANESP) injection - DIALYSIS  60 mcg Intravenous Q Thu-HD   furosemide  80 mg Oral BID   insulin aspart  0-24 Units Subcutaneous TID WC   insulin aspart  4 Units Subcutaneous TID WC   insulin detemir  20 Units  Subcutaneous Daily   midodrine  10 mg Oral Q T,Th,Sa-HD   mupirocin ointment  1 Application Nasal BID   rosuvastatin  10 mg Oral Daily   sevelamer carbonate  1,600 mg Oral TID WC   Continuous Infusions:  cefTAZidime (FORTAZ)  IV Stopped (09/02/22 1236)   DAPTOmycin (CUBICIN) 650 mg in sodium chloride 0.9 % IVPB 650 mg (09/03/22 1952)   PRN Meds:.acetaminophen **OR** acetaminophen, alum & mag hydroxide-simeth, guaiFENesin, HYDROmorphone (DILAUDID) injection, ondansetron (ZOFRAN) IV, traMADol   ASSESMENT:     Solid food dysphagia.  Occ reflux sxs at home.  Narrow distal esophagus on esophagram.  GERD at [facility] E[facility] last year 07/2021: erosive esophagitis.  Gastric ileus, concern for gastroparesis in 07/2021.  No PPI etc at home     A fib, chronic Eliquis.  Last dose, was AM 10/15.  No held.      Osteomyelitis, MRSA of hand. Daptomycin, Fortaz in place.        ESRD, on HD.    Hyponatremia.      Hypoalbuminemia.    Loose but not frequent stools in the setting of antibiotics.  C diff in 06/2021.  Current level of diarrhea not consistent with C. difficile.   Anemia.  Got 1 U RBC in 07/2021.      OSA   PLAN  EGD w esoph dilation tomorrow, will be off eliquis 48 h at that point.  Will work out the timing of this around his dialysis which is due tomorrow.  Hand surgery today.   Louis [name]  09/04/2022, 9:45 AM Phone [phone]   Attending Physician Note   I have taken an interval history, reviewed the chart and examined the patient. I performed more than 50% of this encounter in conjunction with the APP. I agree with the APP's note, impression and recommendations with my edits. My additional impressions and recommendations are as follows.   *Dysphagia with distal esophageal stricture on BA esophagram, GERD *Afib on Eliquis *ESRD on HD *Left hand osteomyelitis   *EGD/dilation tomorrow *Hand surgery today *Eliquis on hold   Louis Edward, MD [facility] See AMION,   GI, for our on call provider

## 2022-09-04 NOTE — Progress Notes (Signed)
PROGRESS NOTE    Louis [name]  WUJ:[id number] DOB: 1946-03-07 DOA: 08/28/2022 PCP: [name], MD   Brief Narrative:  HPI: Louis [name] is a 76 y.o. with history of ESRD on hemodialysis, type 2 diabetes, CHF, CAD, hyperlipidemia, hypertension presented to the ER at the recommendation of his failure medicine physician.  Patient had left hand swelling abdominal swelling for the last 2 days.  He has noticed bloody drainage starting yesterday so he presented to the ER.  He has had wounds on his hands for a year however never to this severity.  In the ER he had x-rays obtained which demonstrated extensive soft tissue swelling of the left thumb concern for osteomyelitis.  Other labs were obtained which showed CBC WBC 16.5, hemoglobin 10.7, sodium 131, potassium 4.7, creatinine 6.7, lactic acid 1.5, magnesium 3.  Hand surgery was consulted in the ED the patient was admitted for IV antibiotics.  He is allergic to vancomycin so he was given linezolid.  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  Patient underwent left upper arm AV fistula ligation and placement of left IJ TDC by vascular surgery on 08/29/2022.  He then underwent Left thumb flexor sheath incision and drainage by Dr. Caralyn Guile on 08/30/2022.  Plan for surgical procedure on the right hand on Monday, according to  patient. Intraoperative cultures are growing MRSA.  ID on board, currently patient  daptomycin and Fortaz per ID and they recommend continuing these for 6 weeks with dialysis and follow-up with ID in the clinic on 10/04/2022.   Vascular surgery also saw him and per them, he has good blood flow so no angiogram is needed.  ESRD on HD: Nephrology on board.  Hyperlipidemia: Continue Crestor.  Type 2 diabetes mellitus: Blood sugar labile.  Continue Lantus 20 units and SSI.  Acute hyponatremia: We will defer to nephrology since he is a dialysis patient.  Permanent atrial fibrillation: Rates controlled.  He is not on any rate control medications. Orthopedics cleared him to resume Eliquis on 09/01/2022.  However this is on hold since the morning dose of 09/03/2022 for planned surgery today and planned EGD tomorrow.   Dysphagia: Patient was noted to have some aspiration event on 08/31/2022 with the nurses.  Evaluated by SLP, they suspect esophageal dysphagia and recommended esophagogram which is completed and shows distal esophageal stenosis.  GI has seen him, he is a scheduled for EGD and possible dilatation tomorrow morning.  DVT prophylaxis: SCD's Start: 08/30/22 1455 SCDs Start: 08/29/22 0549    Code Status: Full Code  Family Communication: Wife present at bedside.  Plan of care discussed with patient in length and he/she verbalized understanding and agreed with it.  Status is: Inpatient Remains inpatient appropriate because: Needs another surgery in the right hand which is planned for today and he is scheduled for EGD on 09/04/2022.  Estimated body mass index is 35.08 kg/m as calculated from the following:   Height as of this encounter: '5\' 7"'$  (1.702 m).   Weight as of this encounter: 101.6 kg.    Nutritional Assessment: Body mass index is 35.08 kg/m.Marland Kitchen Seen by dietician.  I agree with the assessment and plan as outlined below: Nutrition Status:        . Skin Assessment: I have  examined the patient's skin and I agree with the wound assessment as performed by the wound care RN as outlined below:    Consultants:  Ortho  ID Vascular surgery Nephrology GI  Procedures:  As above  Antimicrobials:  Anti-infectives (From admission, onward)    Start     Dose/Rate Route Frequency Ordered Stop   09/02/22 1200  cefTAZidime (FORTAZ) 2 g in sodium chloride 0.9 % 100 mL IVPB        2 g 200 mL/hr over 30 Minutes Intravenous Every T-Th-Sa (Hemodialysis) 09/01/22 1654     09/01/22 2000  DAPTOmycin (CUBICIN) 650 mg in sodium chloride 0.9 % IVPB        8 mg/kg  81.9 kg (Adjusted) 126 mL/hr over 30 Minutes Intravenous Every 48 hours 08/31/22 0738     09/01/22 1830  cefTAZidime (FORTAZ) 1 g in sodium chloride 0.9 % 100 mL IVPB        1 g 200 mL/hr over 30 Minutes Intravenous  Once 09/01/22 1654 09/01/22 2212   08/31/22 2200  DAPTOmycin (CUBICIN) 650 mg in sodium chloride 0.9 % IVPB  Status:  Discontinued        6 mg/kg  105.7 kg 126 mL/hr over 30 Minutes Intravenous Every 48 hours 08/30/22 1822 08/31/22 0738   08/30/22 1915  DAPTOmycin (CUBICIN) 650 mg in sodium chloride 0.9 % IVPB        6 mg/kg  105.7 kg 126 mL/hr over 30 Minutes Intravenous  Once 08/30/22 1822 08/30/22 2126   08/30/22 1800  cefTRIAXone (ROCEPHIN) 2 g in sodium chloride 0.9 % 100 mL IVPB  Status:  Discontinued        2 g 200 mL/hr over 30 Minutes Intravenous Every 24 hours 08/30/22 1018 09/01/22 0958   08/29/22 1500  ceFAZolin (ANCEF) IVPB 2g/100 mL premix  Status:  Discontinued        2 g 200 mL/hr over 30 Minutes Intravenous On call to O.R. 08/29/22 1454 08/29/22 1705   08/29/22 1430  ceFAZolin (ANCEF) IVPB 2g/100 mL premix        2 g 200 mL/hr over 30 Minutes Intravenous  Once 08/29/22 1418 08/29/22 1456   08/29/22 1417  ceFAZolin (ANCEF) 2-4 GM/100ML-% IVPB       Note to Pharmacy: Cameron Sprang M: cabinet override      08/29/22 1417 08/29/22 1524   08/29/22 1400  piperacillin-tazobactam  (ZOSYN) IVPB 2.25 g  Status:  Discontinued        2.25 g 100 mL/hr over 30 Minutes Intravenous Every 8 hours 08/29/22 0501 08/30/22 1018   08/29/22 0500  linezolid (ZYVOX) IVPB 600 mg  Status:  Discontinued        600 mg 300 mL/hr over 60 Minutes Intravenous Every 12 hours 08/29/22 0458 08/30/22 1738   08/29/22 0500  piperacillin-tazobactam (ZOSYN) IVPB 3.375 g        3.375 g 100 mL/hr over 30 Minutes Intravenous  Once 08/29/22 0459 08/29/22 0720         Subjective:  Patient seen and examined.  He has no complaints.  He appears to be motivated.  Wife at the bedside.  Objective: Vitals:   09/03/22 1100 09/03/22 1559 09/03/22 2103 09/04/22 0445  BP: (!) 112/55 (!) 110/51 (!) 117/57 125/71  Pulse: (!) 104 64 95 79  Resp: '16 14 16 16  '$ Temp: 98.6 F (37 C) 98.5 F (36.9 C) 98.3 F (36.8 C) (!) 97.4 F (36.3 C)  TempSrc: Oral Oral Oral Oral  SpO2: 100% 100% 98% 97%  Weight:      Height:        Intake/Output Summary (Last 24 hours) at 09/04/2022 1034 Last data filed at 09/04/2022 0700 Gross per 24 hour  Intake 783 ml  Output 300 ml  Net 483 ml    Filed Weights   08/31/22 1222 09/02/22 0749 09/02/22 1122  Weight: 102.8 kg 105.1 kg 101.6 kg    Examination:  General exam: Appears calm and comfortable  Respiratory system: Clear to auscultation. Respiratory effort normal. Cardiovascular system: S1 & S2 heard, RRR. No JVD, murmurs, rubs, gallops or clicks. No pedal edema. Gastrointestinal system: Abdomen is nondistended, soft and nontender. No organomegaly or masses felt. Normal bowel sounds heard. Central nervous system: Alert and oriented. No focal neurological deficits. Extremities: Mild ulcerated fingers in bilateral hands, dressing in the left thumb. Psychiatry: Judgement and insight appear normal. Mood & affect appropriate.   Data Reviewed: I have personally reviewed following labs and imaging studies  CBC: Recent Labs  Lab 08/28/22 1630 08/28/22 2323  08/29/22 1420 08/30/22 0817 08/31/22 0800 09/01/22 1120  WBC 16.5* 16.3*  --  12.6* 13.1* 13.8*  NEUTROABS 14,421* 14.4*  --   --   --  12.3*  HGB 10.7* 10.4* 11.9* 10.2* 9.6* 10.6*  HCT 33.9* 33.3* 35.0* 33.4* 30.5* 33.4*  MCV 93.1 95.7  --  95.7 95.9 95.4  PLT 217 215  --  211 206 448    Basic Metabolic Panel: Recent Labs  Lab 08/28/22 2323 08/29/22 1420 08/30/22 0816 08/30/22 0817 08/31/22 0800 09/02/22 0702 09/02/22 0834  NA 131* 127*  --  133* 128* 127* 127*  K 4.7 5.0  --  4.3 4.8 4.6 4.3  CL 90* 90*  --  90* 88* 89* 88*  CO2 27  --   --  '27 25 23 25  '$ GLUCOSE 137* 102*  --  74 208* 117* 112*  BUN 68* 74*  --  42* 64* 76* 75*  CREATININE 6.76* 7.40*  --  4.67* 5.62* 5.92* 5.95*  CALCIUM 8.6*  --   --  8.7* 8.7* 8.6* 8.6*  MG 3.0*  --   --   --   --   --   --   PHOS  --   --  4.9*  --  7.4* 5.9* 5.8*    GFR: Estimated Creatinine Clearance: 12 mL/min (A) (by C-G formula based on SCr of 5.95 mg/dL (H)). Liver Function Tests: Recent Labs  Lab 08/28/22 2323 08/31/22 0800 09/02/22 0702 09/02/22 0834  AST 26  --   --   --   ALT 23  --   --   --   ALKPHOS 248*  --   --   --   BILITOT 1.0  --   --   --   PROT 6.5  --   --   --   ALBUMIN 2.4* 2.0* 2.0* 1.9*    No results for input(s): "LIPASE", "AMYLASE" in the last 168 hours. No results for input(s): "AMMONIA" in the last 168 hours. Coagulation Profile: No  results for input(s): "INR", "PROTIME" in the last 168 hours. Cardiac Enzymes: Recent Labs  Lab 08/31/22 0456  CKTOTAL 68    BNP (last 3 results) No results for input(s): "PROBNP" in the last 8760 hours. HbA1C: No results for input(s): "HGBA1C" in the last 72 hours. CBG: Recent Labs  Lab 09/03/22 1229 09/03/22 1603 09/03/22 1953 09/04/22 0820 09/04/22 0954  GLUCAP 136* 124* 214* 169* 182*    Lipid Profile: No results for input(s): "CHOL", "HDL", "LDLCALC", "TRIG", "CHOLHDL", "LDLDIRECT" in the last 72 hours. Thyroid Function Tests: No  results for input(s): "TSH", "T4TOTAL", "FREET4", "T3FREE", "THYROIDAB" in the last 72 hours. Anemia Panel: No results for input(s): "VITAMINB12", "FOLATE", "FERRITIN", "TIBC", "IRON", "RETICCTPCT" in the last 72 hours. Sepsis Labs: Recent Labs  Lab 08/28/22 2323 08/29/22 0539 09/01/22 1120  PROCALCITON  --   --  1.81  LATICACIDVEN 1.5 2.0*  --      Recent Results (from the past 240 hour(s))  WOUND CULTURE     Status: Abnormal   Collection Time: 08/28/22  4:29 PM   Specimen: Thumb; Wound  Result Value Ref Range Status   MICRO NUMBER: [id number]  Final   SPECIMEN QUALITY: Adequate  Final   SOURCE: WOUND (SITE NOT SPECIFIED)  Final   STATUS: FINAL  Final   GRAM STAIN:   Final    No white blood cells seen No epithelial cells seen Many Gram positive cocci in clusters   ISOLATE 1: methicillin resistant Staphylococcus aureus (A)  Final    Comment: Heavy growth of Methicillin resistant Staphylococcus aureus (MRSA) Negative for inducible clindamycin resistance.      Susceptibility   Methicillin resistant staphylococcus aureus - AEROBIC CULT, GRAM STAIN POSITIVE 1    VANCOMYCIN 1 Sensitive     CIPROFLOXACIN >=8 Resistant     CLINDAMYCIN <=0.25 Sensitive     LEVOFLOXACIN 4 Intermediate     ERYTHROMYCIN >=8 Resistant     GENTAMICIN <=0.5 Sensitive     OXACILLIN* NR Resistant      * Oxacillin-resistant staphylococci are resistant to all currently available beta-lactam antimicrobial agents with the possible exception of ceftaroline.     TETRACYCLINE <=1 Sensitive     TRIMETH/SULFA* <=10 Sensitive      * Oxacillin-resistant staphylococci are resistant to all currently available beta-lactam antimicrobial agents with the possible exception of ceftaroline. Legend: S = Susceptible  I = Intermediate R = Resistant  NS = Not susceptible * = Not tested  NR = Not reported **NN = See antimicrobic comments   Blood culture (routine x 2)     Status: None   Collection Time: 08/28/22 11:30 PM    Specimen: BLOOD RIGHT HAND  Result Value Ref Range Status   Specimen Description BLOOD RIGHT HAND  Final   Special Requests   Final    BOTTLES DRAWN AEROBIC AND ANAEROBIC Blood Culture adequate volume   Culture   Final    NO GROWTH 5 DAYS Performed at [facility], [place] [address], [place], [place] [address]    Report Status 09/02/2022 FINAL  Final  Blood culture (routine x 2)     Status: None   Collection Time: 08/29/22  5:40 AM   Specimen: BLOOD RIGHT FOREARM  Result Value Ref Range Status   Specimen Description BLOOD RIGHT FOREARM  Final   Special Requests   Final    BOTTLES DRAWN AEROBIC AND ANAEROBIC Blood Culture results may not be optimal due to an inadequate volume of blood received in  culture bottles   Culture   Final    NO GROWTH 5 DAYS Performed at [facility], [place] [address] [place], [place] [address]    Report Status 09/03/2022 FINAL  Final  MRSA Next Gen by PCR, Nasal     Status: Abnormal   Collection Time: 08/30/22  3:41 AM   Specimen: Nasal Mucosa; Nasal Swab  Result Value Ref Range Status   MRSA by PCR Next Gen DETECTED (A) NOT DETECTED Final    Comment: CRITICAL RESULT CALLED TO, READ BACK BY AND VERIFIED WITH:  C/ ERIC N., RN 08/30/22 0548 A. LAFRANCE (NOTE) The GeneXpert MRSA Assay (FDA approved for NASAL specimens only), is one component of a comprehensive MRSA colonization surveillance program. It is not intended to diagnose MRSA infection nor to guide or monitor treatment for MRSA infections. Test performance is not FDA approved in patients less than 77 years old. Performed at [facility], [place] [address] [place], [place] [address]   Aerobic/Anaerobic Culture w Gram Stain (surgical/deep wound)     Status: None (Preliminary result)   Collection Time: 08/30/22  1:43 PM   Specimen: PATH Other; Body Fluid  Result Value Ref Range Status   Specimen Description WOUND  Final   Special Requests LEFT THUMB ABSCESS SPEC A  Final   Gram Stain    Final    RARE WBC PRESENT, PREDOMINANTLY MONONUCLEAR FEW GRAM POSITIVE COCCI IN PAIRS IN CLUSTERS RARE GRAM NEGATIVE RODS Performed at [facility], [place] [address], [place], [place] [address]    Culture   Final    MODERATE METHICILLIN RESISTANT STAPHYLOCOCCUS AUREUS MODERATE HAEMOPHILUS PARAINFLUENZAE BETA LACTAMASE NEGATIVE NO ANAEROBES ISOLATED; CULTURE IN PROGRESS FOR 5 DAYS    Report Status PENDING  Incomplete   Organism ID, Bacteria METHICILLIN RESISTANT STAPHYLOCOCCUS AUREUS  Final      Susceptibility   Methicillin resistant staphylococcus aureus - MIC*    CIPROFLOXACIN 4 RESISTANT Resistant     ERYTHROMYCIN >=8 RESISTANT Resistant     GENTAMICIN <=0.5 SENSITIVE Sensitive     OXACILLIN >=4 RESISTANT Resistant     TETRACYCLINE <=1 SENSITIVE Sensitive     VANCOMYCIN 1 SENSITIVE Sensitive     TRIMETH/SULFA <=10 SENSITIVE Sensitive     CLINDAMYCIN <=0.25 SENSITIVE Sensitive     RIFAMPIN <=0.5 SENSITIVE Sensitive     Inducible Clindamycin NEGATIVE Sensitive     * MODERATE METHICILLIN RESISTANT STAPHYLOCOCCUS AUREUS  Aerobic/Anaerobic Culture w Gram Stain (surgical/deep wound)     Status: None (Preliminary result)   Collection Time: 08/30/22  1:51 PM   Specimen: PATH Other; Tissue  Result Value Ref Range Status   Specimen Description TISSUE  Final   Special Requests LEFT THUMB BONE  Final   Gram Stain   Final    NO WBC SEEN RARE GRAM POSITIVE COCCI IN CLUSTERS Performed at [facility], [address], [place], [place] [address]    Culture   Final    MODERATE METHICILLIN RESISTANT STAPHYLOCOCCUS AUREUS FEW HAEMOPHILUS PARAINFLUENZAE BETA LACTAMASE NEGATIVE NO ANAEROBES ISOLATED; CULTURE IN PROGRESS FOR 5 DAYS    Report Status PENDING  Incomplete   Organism ID, Bacteria METHICILLIN RESISTANT STAPHYLOCOCCUS AUREUS  Final      Susceptibility   Methicillin resistant staphylococcus aureus - MIC*    CIPROFLOXACIN 4 RESISTANT Resistant     ERYTHROMYCIN >=8  RESISTANT Resistant     GENTAMICIN <=0.5 SENSITIVE Sensitive     OXACILLIN >=4 RESISTANT Resistant  TETRACYCLINE <=1 SENSITIVE Sensitive     VANCOMYCIN <=0.5 SENSITIVE Sensitive     TRIMETH/SULFA <=10 SENSITIVE Sensitive     CLINDAMYCIN <=0.25 SENSITIVE Sensitive     RIFAMPIN <=0.5 SENSITIVE Sensitive     Inducible Clindamycin NEGATIVE Sensitive     * MODERATE METHICILLIN RESISTANT STAPHYLOCOCCUS AUREUS     Radiology Studies: No results found.  Scheduled Meds:  Chlorhexidine Gluconate Cloth  6 each Topical Q0600   darbepoetin (ARANESP) injection - DIALYSIS  60 mcg Intravenous Q Thu-HD   furosemide  80 mg Oral BID   insulin aspart  0-24 Units Subcutaneous TID WC   insulin aspart  4 Units Subcutaneous TID WC   insulin detemir  20 Units Subcutaneous Daily   midodrine  10 mg Oral Q T,Th,Sa-HD   rosuvastatin  10 mg Oral Daily   sevelamer carbonate  1,600 mg Oral TID WC   Continuous Infusions:  cefTAZidime (FORTAZ)  IV Stopped (09/02/22 1236)   DAPTOmycin (CUBICIN) 650 mg in sodium chloride 0.9 % IVPB 650 mg (09/03/22 1952)     LOS: 5 days   Darliss Cheney, MD [facility]  09/04/2022, 10:34 AM   *Please note that this is a verbal dictation therefore any spelling or grammatical errors are due to the "[place] [employer]" system interpretation.  Please page via [place] and do not message via secure chat for urgent patient care matters. Secure chat can be used for non urgent patient care matters.  How to contact the [facility] Attending or Consulting provider [place] or covering provider during after hours [place], for this patient?  Check the care team in [facility] and look for a) attending/consulting TRH provider listed and b) the [facility] team listed. Page or secure chat [phone]. Log into [email or web address] and use [place]'s universal password to access. If you do not have the password, please contact the hospital operator. Locate the [facility] provider you are looking for under [facility]  and page to a number that you can be directly reached. If you still have difficulty reaching the provider, please page the [facility] (Director on Call) for the Hospitalists listed on amion for assistance.

## 2022-09-04 NOTE — TOC Initial Note (Incomplete)
Transition of Care [facility]) - Initial/Assessment Note    Patient Details  Name: Louis [name] MRN: [id number] Date of Birth: [birth year]  Transition of [facility]) CM/SW Contact:    Sharin Mons, RN Phone Number: 09/04/2022, 10:55 AM  Clinical Narrative:                 Present with drainage and pain and swelling of left hand/thumb.   - s/p I&D left thumb 10/11,  tunneled dialysis catheter placement 10/10.  From home with wife. PTA independent with ADL's .  Plan: further I&D 10/16 and possible amputation Expected Discharge Plan: [place] Barriers to Discharge: Continued Medical Work up   Patient Goals and CMS Choice     Choice offered to / list presented to : Patient  Expected Discharge Plan and Services Expected Discharge Plan: [place]   Discharge Planning Services: CM Consult                                          Prior Living Arrangements/Services   Lives with:: Spouse Patient language and need for interpreter reviewed:: Yes Do you feel safe going back to the place where you live?: Yes      Need for Family Participation in Patient Care: Yes (Comment) Care giver support system in place?: Yes (comment) Current home services: DME (RW, old W/C ( [employer]) that doesn't fit well, requesting new W/C) Criminal Activity/Legal Involvement Pertinent to Current Situation/Hospitalization: No - Comment as needed  Activities of Daily Living Home Assistive Devices/Equipment: Wheelchair, Radio producer (specify quad or straight) ADL Screening (condition at time of admission) Patient's cognitive ability adequate to safely complete daily activities?: No Is the patient deaf or have difficulty hearing?: No Does the patient have difficulty seeing, even when wearing glasses/contacts?: No Does the patient have difficulty concentrating, remembering, or making decisions?: No Patient able to express need for assistance with ADLs?:  Yes Does the patient have difficulty dressing or bathing?: Yes Independently performs ADLs?: No Does the patient have difficulty walking or climbing stairs?: Yes Weakness of Legs: Both Weakness of Arms/Hands: Both  Permission Sought/Granted   Permission granted to share information with : Yes, Verbal Permission Granted  Share Information with NAME: Louis [name]  Spouse  [phone]           Emotional Assessment Appearance:: Appears stated age Attitude/Demeanor/Rapport: Engaged Affect (typically observed): Accepting Orientation: : Oriented to Self, Oriented to Place, Oriented to  Time, Oriented to Situation Alcohol / Substance Use: Not Applicable Psych Involvement: No (comment)  Admission diagnosis:  Gas gangrene ([place]) [A48.0] Osteomyelitis ([place]) [M86.9] AKI (acute kidney injury) ([place]) [N17.9] Osteomyelitis, unspecified site, unspecified type [facility]) [M86.9] Patient Active Problem List   Diagnosis Date Noted   MRSA infection 09/01/2022   Gram-negative infection 09/01/2022   Chronic osteomyelitis of right hand including fingers ([place])    Chronic multifocal osteomyelitis, left hand ([place])    ESRD on hemodialysis ([place])    Gas gangrene ([place])    Osteomyelitis ([place]) 08/29/2022   Diabetic ulcer of left heel associated with diabetes mellitus due to underlying condition ([place]) 10/19/2021   History of Clostridium difficile colitis 10/19/2021   Gait instability 11/17/2020   Allergy, unspecified, initial encounter 07/14/2020   Trigger finger, right middle finger 02/03/2020   Pain in both hands 02/03/2020   Anticoagulant long-term use 01/06/2020   Iron  deficiency anemia, unspecified 09/01/2019   Other long term (current) drug therapy 08/27/2019   Liver disease, unspecified 08/27/2019   Internal hemorrhoids 06/05/2019   Poor mobility 05/02/2019   Hypertensive left ventricular hypertrophy, without heart failure 05/02/2019   Secondary hyperparathyroidism of renal origin ([place]) 01/25/2019    Atrial fibrillation ([place]) 12/23/2018   Other specified coagulation defects ([place]) 12/23/2018   Mechanical complication of cardiovascular device 12/23/2018   Anemia in chronic kidney disease 12/20/2018   Anasarca 12/14/2018   SOB (shortness of breath) on exertion 12/12/2018   Bilateral pseudophakia 02/27/2018   Venous insufficiency (chronic) (peripheral) 09/06/2017   Diabetic peripheral neuropathy ([place]) 06/12/2017   Charcot gait 02/02/2017   Charcot's joint 02/02/2017   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 ([place]) 01/08/2017   Diabetic polyneuropathy associated with type 2 diabetes mellitus ([place]) 12/25/2016   Idiopathic chronic venous hypertension of both lower extremities with inflammation 12/25/2016   Combined forms of age-related cataract of both eyes 10/19/2016   Cataract, right 05/18/2016   Right cervical radiculopathy 05/12/2016   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 ([place]) 10/12/2015   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 06/17/2015   Gout 04/20/2015   Osteoarthritis of both acromioclavicular joints 12/03/2014   Fracture of humerus, left, closed 11/17/2014   Unstable angina pectoris ([place]) 10/05/2014   History of colonic polyps 01/28/2014   Bilateral carpal tunnel syndrome 01/09/2014   Macular edema, diabetic ([place]) 10/31/2013   Obesity (BMI 30-39.9) 07/31/2013   Astigmatism 01/11/2012   Presbyopia 01/11/2012   CAD (coronary artery disease) 05/17/2011   BMI 32.0-32.9,adult 03/01/2011   Bradycardia 02/08/2011   Right knee pain 02/08/2011   Hyperlipidemia    RBBB 02/01/2011   ESRD (end stage renal disease) ([place]) 10/23/2006   Proteinuria 10/23/2006   Obstructive sleep apnea 10/05/2006   CKD stage 5 due to type 2 diabetes mellitus ([place]) 08/28/2006   Essential hypertension 08/28/2006   PCP:  [name], MD Pharmacy:   [facility] [address], [place] [address] Phone: [phone] Fax: [phone]     Social Determinants of Health (SDOH) Interventions    Readmission Risk Interventions     No data to display

## 2022-09-05 ENCOUNTER — Inpatient Hospital Stay (HOSPITAL_COMMUNITY): Payer: Medicare Other | Admitting: Certified Registered Nurse Anesthetist

## 2022-09-05 ENCOUNTER — Encounter (HOSPITAL_COMMUNITY): Payer: Self-pay | Admitting: Orthopedic Surgery

## 2022-09-05 ENCOUNTER — Encounter (HOSPITAL_COMMUNITY)
Admission: EM | Disposition: A | Discharge status: Skilled Nursing Facility | Payer: Self-pay | Source: Home / Self Care | Attending: Family Medicine

## 2022-09-05 ENCOUNTER — Other Ambulatory Visit: Payer: Self-pay

## 2022-09-05 DIAGNOSIS — K257 Chronic gastric ulcer without hemorrhage or perforation: Secondary | ICD-10-CM

## 2022-09-05 DIAGNOSIS — K259 Gastric ulcer, unspecified as acute or chronic, without hemorrhage or perforation: Secondary | ICD-10-CM

## 2022-09-05 DIAGNOSIS — K3189 Other diseases of stomach and duodenum: Secondary | ICD-10-CM

## 2022-09-05 DIAGNOSIS — I4891 Unspecified atrial fibrillation: Secondary | ICD-10-CM | POA: Diagnosis not present

## 2022-09-05 DIAGNOSIS — K222 Esophageal obstruction: Secondary | ICD-10-CM

## 2022-09-05 DIAGNOSIS — R1319 Other dysphagia: Secondary | ICD-10-CM

## 2022-09-05 DIAGNOSIS — M86042 Acute hematogenous osteomyelitis, left hand: Secondary | ICD-10-CM | POA: Diagnosis not present

## 2022-09-05 DIAGNOSIS — R933 Abnormal findings on diagnostic imaging of other parts of digestive tract: Secondary | ICD-10-CM

## 2022-09-05 HISTORY — PX: ESOPHAGOGASTRODUODENOSCOPY (EGD) WITH PROPOFOL: SHX5813

## 2022-09-05 HISTORY — PX: BIOPSY: SHX5522

## 2022-09-05 HISTORY — PX: SAVORY DILATION: SHX5439

## 2022-09-05 LAB — CBC
HCT: 30.8 % — ABNORMAL LOW (ref 39.0–52.0)
Hemoglobin: 9.8 g/dL — ABNORMAL LOW (ref 13.0–17.0)
MCH: 29.8 pg (ref 26.0–34.0)
MCHC: 31.8 g/dL (ref 30.0–36.0)
MCV: 93.6 fL (ref 80.0–100.0)
Platelets: 212 10*3/uL (ref 150–400)
RBC: 3.29 MIL/uL — ABNORMAL LOW (ref 4.22–5.81)
RDW: 18.6 % — ABNORMAL HIGH (ref 11.5–15.5)
WBC: 15.6 10*3/uL — ABNORMAL HIGH (ref 4.0–10.5)
nRBC: 0 % (ref 0.0–0.2)

## 2022-09-05 LAB — RENAL FUNCTION PANEL
Albumin: 2 g/dL — ABNORMAL LOW (ref 3.5–5.0)
Anion gap: 14 (ref 5–15)
BUN: 82 mg/dL — ABNORMAL HIGH (ref 8–23)
CO2: 27 mmol/L (ref 22–32)
Calcium: 9.2 mg/dL (ref 8.9–10.3)
Chloride: 88 mmol/L — ABNORMAL LOW (ref 98–111)
Creatinine, Ser: 6.76 mg/dL — ABNORMAL HIGH (ref 0.61–1.24)
GFR, Estimated: 8 mL/min — ABNORMAL LOW (ref >60)
Glucose, Bld: 256 mg/dL — ABNORMAL HIGH (ref 70–99)
Phosphorus: 5.4 mg/dL — ABNORMAL HIGH (ref 2.5–4.6)
Potassium: 4.8 mmol/L (ref 3.5–5.1)
Sodium: 129 mmol/L — ABNORMAL LOW (ref 135–145)

## 2022-09-05 LAB — GLUCOSE, CAPILLARY
Glucose-Capillary: 271 mg/dL — ABNORMAL HIGH (ref 70–99)
Glucose-Capillary: 146 mg/dL — ABNORMAL HIGH (ref 70–99)
Glucose-Capillary: 161 mg/dL — ABNORMAL HIGH (ref 70–99)
Glucose-Capillary: 293 mg/dL — ABNORMAL HIGH (ref 70–99)

## 2022-09-05 SURGERY — ESOPHAGOGASTRODUODENOSCOPY (EGD) WITH PROPOFOL
Anesthesia: Monitor Anesthesia Care

## 2022-09-05 MED ORDER — PHENYLEPHRINE 80 MCG/ML (10ML) SYRINGE FOR IV PUSH (FOR BLOOD PRESSURE SUPPORT)
PREFILLED_SYRINGE | INTRAVENOUS | Status: DC | PRN
  Administered 2022-09-05 (×2): 80 ug via INTRAVENOUS
  Administered 2022-09-05: 160 ug via INTRAVENOUS
  Administered 2022-09-05 (×2): 80 ug via INTRAVENOUS

## 2022-09-05 MED ORDER — HEPARIN SODIUM (PORCINE) 1000 UNIT/ML DIALYSIS
1000.0000 [IU] | INTRAMUSCULAR | Status: DC | PRN
  Administered 2022-09-05: 1000 [IU]
  Filled 2022-09-05: qty 1

## 2022-09-05 MED ORDER — PROPOFOL 500 MG/50ML IV EMUL
INTRAVENOUS | Status: DC | PRN
  Administered 2022-09-05: 75 ug/kg/min via INTRAVENOUS

## 2022-09-05 MED ORDER — PANTOPRAZOLE SODIUM 40 MG PO TBEC
40.0000 mg | DELAYED_RELEASE_TABLET | Freq: Two times a day (BID) | ORAL | Status: DC
  Administered 2022-09-05 – 2022-09-13 (×16): 40 mg via ORAL
  Filled 2022-09-05 (×16): qty 1

## 2022-09-05 MED ORDER — SODIUM CHLORIDE 0.9 % IV SOLN: INTRAVENOUS | Status: DC

## 2022-09-05 MED ORDER — ANTICOAGULANT SODIUM CITRATE 4% (200MG/5ML) IV SOLN
5.0000 mL | Status: DC | PRN
  Filled 2022-09-05: qty 5

## 2022-09-05 MED ORDER — ALTEPLASE 2 MG IJ SOLR: 2.0000 mg | Freq: Once | INTRAMUSCULAR | Status: DC | PRN

## 2022-09-05 SURGICAL SUPPLY — 15 items

## 2022-09-05 NOTE — Progress Notes (Signed)
   09/05/22 1250  Vitals  Temp 97.8 F (36.6 C)  Temp Source Oral  BP 121/76  MAP (mmHg) 90  BP Location Right Arm  BP Method Automatic  Patient Position (if appropriate) Lying  Pulse Rate 62  Pulse Rate Source Monitor  ECG Heart Rate 68  Resp 19  Oxygen Therapy  SpO2 98 %  O2 Device Room Air  Pulse Oximetry Type Continuous   Received patient in bed to unit.  Alert and oriented.  Informed consent signed and in chart.   Treatment initiated: 0816 Treatment completed: 1235  Patient tolerated well.  Transported back to the room  Alert, without acute distress.  Hand-off given to patient's nurse.   Access used: HD cath Access issues: NA  Total UF removed: 4277m Medication(s) given: FTressie Ellis2g IVPB Post HD VS: see above Post HD weight: 100.4kg   [facility]

## 2022-09-05 NOTE — Op Note (Addendum)
[facility] Patient Name: Louis [name] Procedure Date : 09/05/2022 MRN: [id number] Attending MD: Ladene Artist , MD Date of Birth: Nov 15, 1946 CSN: [id number] Age: 76 Admit Type: Inpatient Procedure:                Upper GI endoscopy Indications:              Dysphagia, Abnormal esophagram Providers:                Pricilla Riffle. Fuller Plan, MD, Jaci Carrel, RN, Brien Mates, RNFA, Luan Moore, Technician Referring MD:             [facility] Medicines:                Monitored Anesthesia Care Complications:            No immediate complications. Estimated Blood Loss:     Estimated blood loss was minimal. Procedure:                Pre-Anesthesia Assessment:                           - Prior to the procedure, a History and Physical                            was performed, and patient medications and                            allergies were reviewed. The patient's tolerance of                            previous anesthesia was also reviewed. The risks                            and benefits of the procedure and the sedation                            options and risks were discussed with the patient.                            All questions were answered, and informed consent                            was obtained. Prior Anticoagulants: The patient has                            taken Eliquis (apixaban), last dose was 4 days                            prior to procedure. ASA Grade Assessment: III - A                            patient with severe systemic disease. After  reviewing the risks and benefits, the patient was                            deemed in satisfactory condition to undergo the                            procedure.                           After obtaining informed consent, the endoscope was                            passed under direct vision. Throughout the                            procedure, the  patient's blood pressure, pulse, and                            oxygen saturations were monitored continuously. The                            [id number]-H190 ([id number]) Olympus endoscope was introduced                            through the mouth, and advanced to the second part                            of duodenum. The upper GI endoscopy was                            accomplished without difficulty. The patient                            tolerated the procedure well. Scope In: Scope Out: Findings:      One benign-appearing, intrinsic mild stenosis was found at the       gastroesophageal junction. This stenosis measured 1.4 cm (inner       diameter) x less than one cm (in length). The stenosis was traversed. A       guidewire was placed and the scope was withdrawn. Dilation was performed       with a Savary dilator with mild resistance at 17 mm.      The exam of the esophagus was otherwise normal.      Three non-bleeding cratered gastric ulcers with a clean ulcer base       (Forrest Class III) were found in the gastric antrum. The largest lesion       was 6 mm in largest dimension. Biopsies were taken with a cold forceps       for histology.      Diffuse mildly erythematous mucosa without bleeding was found in the       gastric body. Biopsies were taken with a cold forceps for histology.      A medium amount of food (residue) was found in the gastric fundus, in       the gastric body and in the gastric antrum.      The exam of  the stomach was otherwise normal.      The duodenal bulb and second portion of the duodenum were normal. Impression:               - Benign-appearing esophageal stenosis. Dilated.                           - Non-bleeding gastric ulcers with a clean ulcer                            base (Forrest Class III). Biopsied.                           - Erythematous mucosa in the gastric body. Biopsied.                           - Retained gastric solids.                            - Normal duodenal bulb and second portion of the                            duodenum. Recommendation:           - Return patient to hospital ward for ongoing care.                           - Clear liquid diet for 2 hours, then advance as                            tolerated to soft diet today.                           - Resume prior diet tomorrow.                           - Follow antireflux measures.                           - Continue present medications.                           - Pantoprazole 40 mg po bid for 1 month then                            pantoprazole 40 mg po qd long term.                           - Await pathology results.                           - No aspirin, ibuprofen, naproxen, or other                            non-steroidal anti-inflammatory drugs.                           -  Resume Eliquis (apixaban) no sooner than 2 days.                            Refer to managing physician for further adjustment                            of therapy.                           - GI signing off.                           - Outpatient GI follow up with Dr. Carlean Purl in 6                            weeks. Procedure Code(s):        --- Professional ---                           [phone], Esophagogastroduodenoscopy, flexible,                            transoral; with insertion of guide wire followed by                            passage of dilator(s) through esophagus over guide                            wire                           43239, 41, Esophagogastroduodenoscopy, flexible,                            transoral; with biopsy, single or multiple Diagnosis Code(s):        --- Professional ---                           K22.2, Esophageal obstruction                           K25.9, Gastric ulcer, unspecified as acute or                            chronic, without hemorrhage or perforation                           K31.89, Other diseases of stomach and duodenum                            R13.10, Dysphagia, unspecified                           R93.3, Abnormal findings on diagnostic imaging of                            other parts of digestive tract  CPT copyright 2019 [employer] Medical Association. All rights reserved. The codes documented in this report are preliminary and upon coder review may  be revised to meet current compliance requirements. Ladene Artist, MD 09/05/2022 2:29:10 PM This report has been signed electronically. Number of Addenda: 0

## 2022-09-05 NOTE — Progress Notes (Signed)
PT Cancellation Note  Patient Details Name: Louis [name] MRN: [id number] DOB: Dec 07, 1945   Cancelled Treatment:    Reason Eval/Treat Not Completed: Patient at procedure or test/unavailable Pt off floor at HD. Will follow.   Marguarite Arbour A Trevel Dillenbeck 09/05/2022, 10:11 AM Marisa Severin, PT, DPT Acute Rehabilitation Services Secure chat preferred Office [phone]

## 2022-09-05 NOTE — Transfer of Care (Signed)
Immediate Anesthesia Transfer of Care Note  Patient: Louis [name]  Procedure(s) Performed: ESOPHAGOGASTRODUODENOSCOPY (EGD) WITH PROPOFOL BIOPSY SAVORY DILATION  Patient Location: Endoscopy Unit  Anesthesia Type:MAC  Level of Consciousness: awake and drowsy  Airway & Oxygen Therapy: Patient Spontanous Breathing and Patient connected to nasal cannula oxygen  Post-op Assessment: Report given to RN and Post -op Vital signs reviewed and stable  Post vital signs: Reviewed and stable  Last Vitals:  Vitals Value Taken Time  BP 112/71 09/05/22 1428  Temp    Pulse 55 09/05/22 1429  Resp 17 09/05/22 1429  SpO2 99 % 09/05/22 1429  Vitals shown include unvalidated device data.  Last Pain:  Vitals:   09/05/22 1305  TempSrc: Temporal  PainSc: 0-No pain      Patients Stated Pain Goal: 2 (54/49/20 1007)  Complications: No notable events documented.

## 2022-09-05 NOTE — TOC Initial Note (Addendum)
Transition of Care [facility]) - Initial/Assessment Note    Patient Details  Name: Louis [name] MRN: [id number] Date of Birth: 10-Dec-1945  Transition [facility]) CM/SW Contact:    Sharin Mons, RN Phone Number: 09/05/2022, 3:11 PM  LATE ENTRY  Clinical Narrative:     Presents with Left thumb osteomyelitis.    - s/p open debridement of skin subcutaneous tissue and bone associated with osteomyelitis left thumb excisional debridement, 10/17, and Left thumb amputation  NCM @  bedside with pt and wife to discuss d/c planning. Pt ok with NCM sharing with wife. Pt is from home with wife. Pt required some assistance with bathing and dressing PTA. Wife states she is  currently undergoing treatment for metastatic breast CA. Wife states pt is open to SNF [facility] if needed. Pt agreeable. Wife states pt is weaker , not sure it she can manage care if d/c to home. SNF preference : [place]. NCM discussed insurance authorization process. Pt already provided Medicare SNF ratings list by CSW.  PT's recommendation: Skilled nursing-short term rehab (<3 hours/day) (but hopeful to work towards home).    TOC team will continue to follow and assist with discharge planning needs.      Expected Discharge Plan: [place] (vs SNF) Barriers to Discharge: Continued Medical Work up   Patient Goals and CMS Choice     Choice offered to / list presented to : Patient  Expected Discharge Plan and Services Expected Discharge Plan: [place] (vs SNF)   Discharge Planning Services: CM Consult   Living arrangements for the past 2 months: Single Family Home                     Prior Living Arrangements/Services Living arrangements for the past 2 months: Single Family Home Lives with:: Self Patient language and need for interpreter reviewed:: Yes Do you feel safe going back to the place where you live?: Yes      Need for Family Participation in Patient Care: Yes  (Comment) Care giver support system in place?: Yes (comment) Current home services: DME (RW, old W/C ( [employer]) that doesn't fit well, requesting new W/C, lift chair) Criminal Activity/Legal Involvement Pertinent to Current Situation/Hospitalization: No - Comment as needed  Activities of Daily Living Home Assistive Devices/Equipment: Wheelchair, Radio producer (specify quad or straight) ADL Screening (condition at time of admission) Patient's cognitive ability adequate to safely complete daily activities?: No Is the patient deaf or have difficulty hearing?: No Does the patient have difficulty seeing, even when wearing glasses/contacts?: No Does the patient have difficulty concentrating, remembering, or making decisions?: No Patient able to express need for assistance with ADLs?: Yes Does the patient have difficulty dressing or bathing?: Yes Independently performs ADLs?: No Does the patient have difficulty walking or climbing stairs?: Yes Weakness of Legs: Both Weakness of Arms/Hands: Both  Permission Sought/Granted   Permission granted to share information with : Yes, Verbal Permission Granted  Share Information with NAME: Yeriel Mineo  Spouse  [phone]           Emotional Assessment Appearance:: Appears stated age Attitude/Demeanor/Rapport: Engaged Affect (typically observed): Accepting Orientation: : Oriented to Self, Oriented to Place, Oriented to  Time, Oriented to Situation Alcohol / Substance Use: Not Applicable Psych Involvement: No (comment)  Admission diagnosis:  Gas gangrene ([place]) [A48.0] Osteomyelitis ([place]) [M86.9] AKI (acute kidney injury) ([place]) [N17.9] Osteomyelitis, unspecified site, unspecified type [facility]) [M86.9] Patient Active Problem List  Diagnosis Date Noted   Esophageal dysphagia    Chronic gastric ulcer without hemorrhage and without perforation    Abnormal esophagram    MRSA infection 09/01/2022   Gram-negative infection 09/01/2022   Chronic  osteomyelitis of right hand including fingers (HCC)    Chronic multifocal osteomyelitis, left hand ([place])    ESRD on hemodialysis ([place])    Gas gangrene ([place])    Osteomyelitis ([place]) 08/29/2022   Diabetic ulcer of left heel associated with diabetes mellitus due to underlying condition ([place]) 10/19/2021   History of Clostridium difficile colitis 10/19/2021   Gait instability 11/17/2020   Allergy, unspecified, initial encounter 07/14/2020   Trigger finger, right middle finger 02/03/2020   Pain in both hands 02/03/2020   Anticoagulant long-term use 01/06/2020   Iron deficiency anemia, unspecified 09/01/2019   Other long term (current) drug therapy 08/27/2019   Liver disease, unspecified 08/27/2019   Internal hemorrhoids 06/05/2019   Poor mobility 05/02/2019   Hypertensive left ventricular hypertrophy, without heart failure 05/02/2019   Secondary hyperparathyroidism of renal origin ([place]) 01/25/2019   Atrial fibrillation ([place]) 12/23/2018   Other specified coagulation defects ([place]) 12/23/2018   Mechanical complication of cardiovascular device 12/23/2018   Anemia in chronic kidney disease 12/20/2018   Anasarca 12/14/2018   SOB (shortness of breath) on exertion 12/12/2018   Bilateral pseudophakia 02/27/2018   Venous insufficiency (chronic) (peripheral) 09/06/2017   Diabetic peripheral neuropathy ([place]) 06/12/2017   Charcot gait 02/02/2017   Charcot's joint 02/02/2017   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 ([place]) 01/08/2017   Diabetic polyneuropathy associated with type 2 diabetes mellitus ([place]) 12/25/2016   Idiopathic chronic venous hypertension of both lower extremities with inflammation 12/25/2016   Combined forms of age-related cataract of both eyes 10/19/2016   Cataract, right 05/18/2016   Right cervical radiculopathy 05/12/2016   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 ([place]) 10/12/2015   Squamous cell  carcinoma in situ of skin of forearm 06/17/2015   Gout 04/20/2015   Osteoarthritis of both acromioclavicular joints 12/03/2014   Fracture of humerus, left, closed 11/17/2014   Unstable angina pectoris ([place]) 10/05/2014   History of colonic polyps 01/28/2014   Bilateral carpal tunnel syndrome 01/09/2014   Macular edema, diabetic ([place]) 10/31/2013   Obesity (BMI 30-39.9) 07/31/2013   Astigmatism 01/11/2012   Presbyopia 01/11/2012   CAD (coronary artery disease) 05/17/2011   BMI 32.0-32.9,adult 03/01/2011   Bradycardia 02/08/2011   Right knee pain 02/08/2011   Hyperlipidemia    RBBB 02/01/2011   ESRD (end stage renal disease) ([place]) 10/23/2006   Proteinuria 10/23/2006   Obstructive sleep apnea 10/05/2006   CKD stage 5 due to type 2 diabetes mellitus ([place]) 08/28/2006   Essential hypertension 08/28/2006   PCP:  [name], MD Pharmacy:   [facility] [address], [place] [address] Phone: [phone] Fax: [phone]     Social Determinants of Health (SDOH) Interventions    Readmission Risk Interventions     No data to display

## 2022-09-05 NOTE — Progress Notes (Signed)
[facility] Progress Note   Subjective:   seen in HD-  resting  Objective Vitals:   09/05/22 0830 09/05/22 0900 09/05/22 0924 09/05/22 1004  BP: 109/66 (!) 132/92 122/72 116/78  Pulse: (!) 55 75    Resp: '11 19 12 13  '$ Temp:      TempSrc:      SpO2: 98% 94%    Weight:      Height:       Physical Exam General:chronically ill appearing male in NAD Heart:RRR, no mrg Lungs:mostly CTAB, nml WOB on RA Abdomen:soft, NTND Extremities:2+ edema on RLE, 1+ on LLE, L thumb wrapped Dialysis Access: [facility]   Filed Weights   09/02/22 0749 09/02/22 1122 09/05/22 0805  Weight: 105.1 kg 101.6 kg 104.2 kg    Intake/Output Summary (Last 24 hours) at 09/05/2022 1024 Last data filed at 09/04/2022 1800 Gross per 24 hour  Intake 360 ml  Output 320 ml  Net 40 ml    Additional Objective Labs: Basic Metabolic Panel: Recent Labs  Lab 09/02/22 0702 09/02/22 0834 09/04/22 1024 09/05/22 0814  NA 127* 127* 129* 129*  K 4.6 4.3 4.2 4.8  CL 89* 88* 91* 88*  CO2 '23 25 24 27  '$ GLUCOSE 117* 112* 158* 256*  BUN 76* 75* 72* 82*  CREATININE 5.92* 5.95* 6.18* 6.76*  CALCIUM 8.6* 8.6* 8.4* 9.2  PHOS 5.9* 5.8*  --  5.4*   Liver Function Tests: Recent Labs  Lab 09/02/22 0702 09/02/22 0834 09/05/22 0814  ALBUMIN 2.0* 1.9* 2.0*   CBC: Recent Labs  Lab 08/30/22 0817 08/31/22 0800 09/01/22 1120 09/04/22 1024 09/05/22 0814  WBC 12.6* 13.1* 13.8* 14.3* 15.6*  NEUTROABS  --   --  12.3*  --   --   HGB 10.2* 9.6* 10.6* 10.3* 9.8*  HCT 33.4* 30.5* 33.4* 33.6* 30.8*  MCV 95.7 95.9 95.4 93.9 93.6  PLT 211 206 270 210 212   Blood Culture    Component Value Date/Time   SDES TISSUE LEFT THUMB 09/04/2022 1351   SPECREQUEST REVISION AMPUTATION 09/04/2022 1351   CULT  09/04/2022 1351    TOO YOUNG TO READ Performed at [facility], [address] [place], [place] [address]    REPTSTATUS PENDING 09/04/2022 1351    Cardiac Enzymes: Recent Labs  Lab 08/31/22 0456   CKTOTAL 68   CBG: Recent Labs  Lab 09/04/22 1400 09/04/22 1425 09/04/22 1536 09/04/22 2010 09/05/22 0744  GLUCAP 130* 113* 122* 314* 271*    Medications:  anticoagulant sodium citrate     cefTAZidime (FORTAZ)  IV 2 g (09/05/22 0921)   DAPTOmycin (CUBICIN) 650 mg in sodium chloride 0.9 % IVPB 650 mg (09/03/22 1952)    Chlorhexidine Gluconate Cloth  6 each Topical Q0600   Chlorhexidine Gluconate Cloth  6 each Topical Q0600   darbepoetin (ARANESP) injection - DIALYSIS  60 mcg Intravenous Q Thu-HD   furosemide  80 mg Oral BID   insulin aspart  0-24 Units Subcutaneous TID WC   insulin aspart  4 Units Subcutaneous TID WC   insulin detemir  20 Units Subcutaneous Daily   midodrine  10 mg Oral Q T,Th,Sa-HD   rosuvastatin  10 mg Oral Daily   sevelamer carbonate  1,600 mg Oral TID WC    Dialysis Orders: TTS SW 4h 450/ 500  101 kg  3K/2.5 bath  Hep none LUA AVF - last HD post HD 101.6kg on 10/7 - last Hb 10.0 10/5 - mircera 50 q 2, last  9/26 - venofer 100 mg tiw IV thru 10/19 - doxercalciferol 5 ug ttw IV   Home meds include - eliquis, furosemide 40 qd, isordil 20, midodrine 10 prn, novolin insulin, rosuvastatin, vits/ supps / prns   Problem/Plan: ESRD- on HD TTS.  Plan for HD today per regular schedule completed around scheduled procedure.   Volume overload- midodrine 10 mg TTS predialysis ordered recently. Tolerating UF 4L each HD here.  +hyponatremia, improved with HD.  Remains volume overloaded, continue max UF as tolerated. Will need dry weight lowered on d/c. Osteomyelitis left hand/thumb status post left arm AV fistula ligation/ left thumb flexor sheath I&D , bone curettage and debridement of osteomyelitis of the distal phalanx by Dr. Apolonio Schneiders (Hand ortho)- noted per ID antibiotic plan now daptomycin and Fortaz  6wk total follow-up in ID clinic on 10/04/2022 and per [facility] has adequate blood flow no angiogram needed.  I&D scheduled today w/possible amputation.  Dysphagia/distal  esophageal narrowing (on barium swallow study )- EGD w/esophageal dilation tomorrow. Per GI.  DM2- plan per admit team Anemia of ESRD -Hgb 10.6 (10/13 )<9.6 (10/12 )Aranesp 60 q Thursday weekly/no iron with infection Secondary hyperparathyroidism -phosphorus close to goal. CCa in goal.  Renvela 800 increased 1600 mg, changed to renal diet yesterday. Nutrition - Renal diet w/fluid restrictions once advanced.  Permanent A Fib - Eliquis on hold.   [facility] 09/05/2022,10:24 AM  LOS: 6 days

## 2022-09-05 NOTE — Anesthesia Procedure Notes (Signed)
Procedure Name: MAC Date/Time: 09/05/2022 2:00 PM  Performed by: Harden Mo, CRNAPre-anesthesia Checklist: Patient identified, Emergency Drugs available, Suction available and Patient being monitored Patient Re-evaluated:Patient Re-evaluated prior to induction Oxygen Delivery Method: Nasal cannula Preoxygenation: Pre-oxygenation with 100% oxygen Induction Type: IV induction Placement Confirmation: positive ETCO2 and breath sounds checked- equal and bilateral Dental Injury: Teeth and Oropharynx as per pre-operative assessment

## 2022-09-05 NOTE — Progress Notes (Signed)
OT Cancellation Note  Patient Details Name: Louis [name] MRN: [id number] DOB: 09/12/1946   Cancelled Treatment:    Reason Eval/Treat Not Completed: Patient at procedure or test/ unavailable (Pt receiving HD this AM and unavailable for OT treatment. Will re-attempt when able.)  Ailene Ravel, OTR/L,CBIS  Supplemental OT - [place] and [facility]  09/05/2022, 8:32 AM

## 2022-09-05 NOTE — TOC Progression Note (Addendum)
Transition of Care [facility]) - Progression Note    Patient Details  Name: LENFORD BEDDOW MRN: [id number] Date of Birth: [birth year]  [facility]) CM/SW Contact  Joanne Chars, LCSW Phone Number: 09/05/2022, 1:42 PM  Clinical Narrative:   CSW spoke with pt wife [name] regarding PT recommendations for SNF.  She is still hopeful pt may be able to come straight home but realizes he may need rehab first.  Her first choice would be CIR.  CSW spoke with Janyce Llanos, who reviewed pt record.  Per Pamala Hurry, pt is not CIR candidate.  CSW spoke with [name] again, updated her on the above.  She is in agreement to send out pt referral for SNF, will continue to monitor PT to see if pt progresses and can go home instead.    1450: CSW spoke with wife in room. Choice document provided.  She asked for referral to be sent to [facility] and [facility].  Also had a few more questions as to why pt could not go to CIR.  Expected Discharge Plan: [place] Barriers to Discharge: Continued Medical Work up  Expected Discharge Plan and Services Expected Discharge Plan: [place]   Discharge Planning Services: CM Consult   Living arrangements for the past 2 months: Single Family Home                                       Social Determinants of Health (SDOH) Interventions    Readmission Risk Interventions     No data to display

## 2022-09-05 NOTE — Procedures (Signed)
Patient was seen on dialysis and the procedure was supervised.  BFR 400  Via TDC BP is  126/75.   Patient appears to be tolerating treatment well  Louis Meckel 09/05/2022

## 2022-09-05 NOTE — NC FL2 (Signed)
[place] LEVEL OF CARE SCREENING TOOL     IDENTIFICATION  Patient Name: Louis [name] Birthdate: [birth year] Sex: male Admission Date (Current Location): 08/28/2022  [facility] and [place] Number:  Herbalist and Address:  [facility], [place] [address], [place], [place] [address]      Provider Number: [id number]  Attending Physician Name and Address:  Darliss Cheney, MD  Relative Name and Phone Number:  Tersigni,Gail Spouse [phone]    Current Level of Care: Hospital Recommended Level of Care: [place] Prior Approval Number:    Date Approved/Denied:   PASRR Number: [id number]  Discharge Plan: SNF    Current Diagnoses: Patient Active Problem List   Diagnosis Date Noted   MRSA infection 09/01/2022   Gram-negative infection 09/01/2022   Chronic osteomyelitis of right hand including fingers ([name])    Chronic multifocal osteomyelitis, left hand ([place])    ESRD on hemodialysis ([place])    Gas gangrene ([place])    Osteomyelitis ([place]) 08/29/2022   Diabetic ulcer of left heel associated with diabetes mellitus due to underlying condition ([place]) 10/19/2021   History of Clostridium difficile colitis 10/19/2021   Gait instability 11/17/2020   Allergy, unspecified, initial encounter 07/14/2020   Trigger finger, right middle finger 02/03/2020   Pain in both hands 02/03/2020   Anticoagulant long-term use 01/06/2020   Iron deficiency anemia, unspecified 09/01/2019   Other long term (current) drug therapy 08/27/2019   Liver disease, unspecified 08/27/2019   Internal hemorrhoids 06/05/2019   Poor mobility 05/02/2019   Hypertensive left ventricular hypertrophy, without heart failure 05/02/2019   Secondary hyperparathyroidism of renal origin ([place]) 01/25/2019   Atrial fibrillation ([place]) 12/23/2018   Other specified coagulation defects ([place]) 12/23/2018   Mechanical complication of cardiovascular device 12/23/2018   Anemia in chronic  kidney disease 12/20/2018   Anasarca 12/14/2018   SOB (shortness of breath) on exertion 12/12/2018   Bilateral pseudophakia 02/27/2018   Venous insufficiency (chronic) (peripheral) 09/06/2017   Diabetic peripheral neuropathy ([place]) 06/12/2017   Charcot gait 02/02/2017   Charcot's joint 02/02/2017   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 ([place]) 01/08/2017   Diabetic polyneuropathy associated with type 2 diabetes mellitus ([place]) 12/25/2016   Idiopathic chronic venous hypertension of both lower extremities with inflammation 12/25/2016   Combined forms of age-related cataract of both eyes 10/19/2016   Cataract, right 05/18/2016   Right cervical radiculopathy 05/12/2016   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 ([place]) 10/12/2015   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 06/17/2015   Gout 04/20/2015   Osteoarthritis of both acromioclavicular joints 12/03/2014   Fracture of humerus, left, closed 11/17/2014   Unstable angina pectoris ([place]) 10/05/2014   History of colonic polyps 01/28/2014   Bilateral carpal tunnel syndrome 01/09/2014   Macular edema, diabetic ([place]) 10/31/2013   Obesity (BMI 30-39.9) 07/31/2013   Astigmatism 01/11/2012   Presbyopia 01/11/2012   CAD (coronary artery disease) 05/17/2011   BMI 32.0-32.9,adult 03/01/2011   Bradycardia 02/08/2011   Right knee pain 02/08/2011   Hyperlipidemia    RBBB 02/01/2011   ESRD (end stage renal disease) ([place]) 10/23/2006   Proteinuria 10/23/2006   Obstructive sleep apnea 10/05/2006   CKD stage 5 due to type 2 diabetes mellitus ([place]) 08/28/2006   Essential hypertension 08/28/2006    Orientation RESPIRATION BLADDER Height & Weight     Self, Time, Situation, Place  Normal Incontinent, External catheter Weight: 221  lb 5.5 oz (100.4 kg) Height:  '5\' 7"'$  (170.2 cm)  BEHAVIORAL SYMPTOMS/MOOD NEUROLOGICAL BOWEL NUTRITION STATUS      Incontinent Diet  (see discharge summary)  AMBULATORY STATUS COMMUNICATION OF NEEDS Skin   Limited Assist Verbally Surgical wounds, Other (Comment) (ecchymosis, redness)                       Personal Care Assistance Level of Assistance  Bathing, Feeding, Dressing, Total care Bathing Assistance: Maximum assistance Feeding assistance: Limited assistance Dressing Assistance: Maximum assistance Total Care Assistance: Maximum assistance   Functional Limitations Info  Sight, Hearing, Speech Sight Info: Adequate Hearing Info: Adequate Speech Info: Adequate    SPECIAL CARE FACTORS FREQUENCY  PT (By licensed PT), OT (By licensed OT)     PT Frequency: 5x week OT Frequency: 5x week            Contractures Contractures Info: Not present    Additional Factors Info  Code Status, Allergies, Insulin Sliding Scale Code Status Info: full Allergies Info: Hydrocodone, Oxycodone, Vancomycin, Dacarbazine, Latex, Tape   Insulin Sliding Scale Info: Novolog: see discharge summary       Current Medications (09/05/2022):  This is the current hospital active medication list Current Facility-Administered Medications  Medication Dose Route Frequency Provider Last Rate Last Admin   0.9 %  sodium chloride infusion   Intravenous Continuous Ladene Artist, MD 10 mL/hr at 09/05/22 1356 Continued from Pre-op at 09/05/22 1356   [MAR Hold] acetaminophen (TYLENOL) tablet 650 mg  650 mg Oral Q6H PRN Gabriel Earing, PA-C   650 mg at 08/29/22 0731   Or   [MAR Hold] acetaminophen (TYLENOL) suppository 650 mg  650 mg Rectal Q6H PRN Rhyne, Hulen Shouts, PA-C       [MAR Hold] alum & mag hydroxide-simeth (MAALOX/MYLANTA) 200-200-20 MG/5ML suspension 30 mL  30 mL Oral Q4H PRN Darliss Cheney, MD   30 mL at 09/03/22 2137   [MAR Hold] cefTAZidime (FORTAZ) 2 g in sodium chloride 0.9 % 100 mL IVPB  2 g Intravenous Q T,Th,Sa-HD Darliss Cheney, MD 200 mL/hr at 09/05/22 0921 2 g at 09/05/22 0921   [MAR Hold] Chlorhexidine Gluconate  Cloth 2 % PADS 6 each  6 each Topical Q0600 RhyneHulen Shouts, PA-C   6 each at 09/04/22 0440   [MAR Hold] Chlorhexidine Gluconate Cloth 2 % PADS 6 each  6 each Topical Q0600 Penninger, Lindsay, PA   6 each at 09/05/22 0726   [MAR Hold] DAPTOmycin (CUBICIN) 650 mg in sodium chloride 0.9 % IVPB  8 mg/kg (Adjusted) Intravenous Q48H Rolla Flatten, RPH 126 mL/hr at 09/03/22 1952 650 mg at 09/03/22 1952   [MAR Hold] Darbepoetin Alfa (ARANESP) injection 60 mcg  60 mcg Intravenous Q Thu-HD Tobie Poet E, NP   60 mcg at 08/31/22 1031   [MAR Hold] furosemide (LASIX) tablet 80 mg  80 mg Oral BID Darliss Cheney, MD   80 mg at 09/05/22 0728   [MAR Hold] guaiFENesin (MUCINEX) 12 hr tablet 600 mg  600 mg Oral BID PRN Darliss Cheney, MD       [MAR Hold] HYDROmorphone (DILAUDID) injection 0.5 mg  0.5 mg Intravenous Q4H PRN Pahwani, Einar Grad, MD   0.5 mg at 09/05/22 0729   [MAR Hold] insulin aspart (novoLOG) injection 0-24 Units  0-24 Units Subcutaneous TID WC Rhyne, Samantha J, PA-C   12 Units at 09/05/22 0747   [MAR Hold] insulin aspart (novoLOG) injection 4 Units  4 Units  Subcutaneous TID WC Rhyne, Hulen Shouts, PA-C   4 Units at 09/04/22 1748   [MAR Hold] insulin detemir (LEVEMIR) injection 20 Units  20 Units Subcutaneous Daily Gabriel Earing, PA-C   20 Units at 09/05/22 0729   [MAR Hold] midodrine (PROAMATINE) tablet 10 mg  10 mg Oral Q T,Th,Sa-HD Rhyne, Samantha J, PA-C   10 mg at 09/05/22 0727   [MAR Hold] ondansetron (ZOFRAN) injection 4 mg  4 mg Intravenous Q6H PRN Darliss Cheney, MD   4 mg at 09/04/22 1148   [MAR Hold] rosuvastatin (CRESTOR) tablet 10 mg  10 mg Oral Daily Pahwani, Einar Grad, MD   10 mg at 09/05/22 0728   [MAR Hold] sevelamer carbonate (RENVELA) tablet 1,600 mg  1,600 mg Oral TID WC Ernest Haber, PA-C   1,600 mg at 09/05/22 0727   [MAR Hold] traMADol (ULTRAM) tablet 50 mg  50 mg Oral Q12H PRN Gabriel Earing, PA-C   50 mg at 08/31/22 1037   Facility-Administered Medications Ordered in  Other Encounters  Medication Dose Route Frequency Provider Last Rate Last Admin   PHENYLephrine 80 mcg/ml in normal saline Adult IV Push Syringe (For Blood Pressure Support)   Intravenous Anesthesia Intra-op Harden Mo, CRNA   80 mcg at 09/05/22 1405   propofol (DIPRIVAN) 500 MG/50ML infusion   Intravenous Continuous PRN Harden Mo, CRNA 45.18 mL/hr at 09/05/22 1400 75 mcg/kg/min at 09/05/22 1400     Discharge Medications: Please see discharge summary for a list of discharge medications.  Relevant Imaging Results:  Relevant Lab Results:   Additional Information SSN: [id number].  Pt is HD pt at [facility] SW on TTS. Pt arrives at 6:35 am for 6:55 am chair time  Joanne Chars, LCSW

## 2022-09-05 NOTE — Progress Notes (Signed)
Patient's surgical dressing reinforced once again

## 2022-09-05 NOTE — Progress Notes (Signed)
   Inpatient Rehab Admissions Coordinator :  Per [facility] SW inquiry per family request, patient was screened for CIR candidacy by Danne Baxter RN MSN. Patient does not appear to demonstrate the medical neccesity for a [facility] /CIR admit. I will not place a Rehab Consult. Recommend other Rehab Venues to be pursued. Please contact me with any questions.  Danne Baxter RN MSN Admissions Coordinator [phone]

## 2022-09-05 NOTE — Interval H&P Note (Signed)
History and Physical Interval Note:  09/05/2022 1:54 PM  Louis [name]  has presented today for surgery, with the diagnosis of dysphagia.  The various methods of treatment have been discussed with the patient and family. After consideration of risks, benefits and other options for treatment, the patient has consented to  Procedure(s): ESOPHAGOGASTRODUODENOSCOPY (EGD) WITH PROPOFOL (N/A) as a surgical intervention.  The patient's history has been reviewed, patient examined, no change in status, stable for surgery.  I have reviewed the patient's chart and labs.  Questions were answered to the patient's satisfaction.     Pricilla Riffle. [place] Plan

## 2022-09-05 NOTE — Progress Notes (Signed)
PROGRESS NOTE    Louis [name]  WEX:[id number] DOB: 1945-11-23 DOA: 08/28/2022 PCP: [name], MD   Brief Narrative:  HPI: Louis [name] is a 76 y.o. with history of ESRD on hemodialysis, type 2 diabetes, CHF, CAD, hyperlipidemia, hypertension presented to the ER at the recommendation of his failure medicine physician.  Patient had left hand swelling abdominal swelling for the last 2 days.  He has noticed bloody drainage starting yesterday so he presented to the ER.  He has had wounds on his hands for a year however never to this severity.  In the ER he had x-rays obtained which demonstrated extensive soft tissue swelling of the left thumb concern for osteomyelitis.  Other labs were obtained which showed CBC WBC 16.5, hemoglobin 10.7, sodium 131, potassium 4.7, creatinine 6.7, lactic acid 1.5, magnesium 3.  Hand surgery was consulted in the ED the patient was admitted for IV antibiotics.  He is allergic to vancomycin so he was given linezolid.  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  Patient underwent left upper arm AV fistula ligation and placement of left IJ TDC by vascular surgery on 08/29/2022.  He then underwent Left thumb flexor sheath incision and drainage by Dr. Caralyn Guile on 08/30/2022.  Plan for surgical procedure on the right hand on Monday, according to  patient. Intraoperative cultures are growing MRSA.  ID on board, currently patient  daptomycin and Fortaz per ID and they recommend continuing these for 6 weeks with dialysis and follow-up with ID in the clinic on 10/04/2022.   Vascular surgery also saw him and per them, he has good blood flow so no angiogram is needed.  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 Caralyn Guile on 09/04/2022.  ESRD on HD: Nephrology on board.  Hyperlipidemia: Continue Crestor.  Type 2 diabetes mellitus: Blood sugar elevated but he is n.p.o. for pending EGD later today and thus continue Lantus 20 units and SSI.  Acute hyponatremia: We will defer to nephrology since he is a dialysis patient.  Permanent atrial fibrillation: Rates controlled.  He is not on any rate control medications. Orthopedics cleared him to resume Eliquis on 09/01/2022.  However this is on hold since the morning dose of 09/03/2022 for planned EGD today.  Will resume Eliquis once cleared by GI.  Dysphagia: Patient was noted to have some aspiration event on 08/31/2022 with the nurses.  Evaluated by SLP, they suspect esophageal dysphagia and recommended esophagogram which is completed and shows distal esophageal stenosis.  GI has seen him, he is a scheduled for EGD and possible dilatation today.  DVT prophylaxis: SCD's Start: 09/04/22 2028 SCD's Start: 08/30/22 1455 SCDs Start: 08/29/22 0549    Code Status: Full Code  Family Communication: Wife present  at bedside.  Plan of care discussed with patient in length and he/she verbalized understanding and agreed with it.  Status is: Inpatient Remains inpatient appropriate because: Needs another surgery in the right hand which is planned for today and he is scheduled for EGD on 09/04/2022.     Estimated body mass index is 35.98 kg/m as calculated  from the following:   Height as of this encounter: '5\' 7"'$  (1.702 m).   Weight as of this encounter: 104.2 kg.    Nutritional Assessment: Body mass index is 35.98 kg/m.Marland Kitchen Seen by dietician.  I agree with the assessment and plan as outlined below: Nutrition Status:        . Skin Assessment: I have examined the patient's skin and I agree with the wound assessment as performed by the wound care RN as outlined below:    Consultants:  Ortho  ID Vascular surgery Nephrology GI  Procedures:  As above  Antimicrobials:  Anti-infectives (From admission, onward)    Start     Dose/Rate Route Frequency Ordered Stop   09/02/22 1200  cefTAZidime (FORTAZ) 2 g in sodium chloride 0.9 % 100 mL IVPB        2 g 200 mL/hr over 30 Minutes Intravenous Every T-Th-Sa (Hemodialysis) 09/01/22 1654     09/01/22 2000  DAPTOmycin (CUBICIN) 650 mg in sodium chloride 0.9 % IVPB        8 mg/kg  81.9 kg (Adjusted) 126 mL/hr over 30 Minutes Intravenous Every 48 hours 08/31/22 0738     09/01/22 1830  cefTAZidime (FORTAZ) 1 g in sodium chloride 0.9 % 100 mL IVPB        1 g 200 mL/hr over 30 Minutes Intravenous  Once 09/01/22 1654 09/01/22 2212   08/31/22 2200  DAPTOmycin (CUBICIN) 650 mg in sodium chloride 0.9 % IVPB  Status:  Discontinued        6 mg/kg  105.7 kg 126 mL/hr over 30 Minutes Intravenous Every 48 hours 08/30/22 1822 08/31/22 0738   08/30/22 1915  DAPTOmycin (CUBICIN) 650 mg in sodium chloride 0.9 % IVPB        6 mg/kg  105.7 kg 126 mL/hr over 30 Minutes Intravenous  Once 08/30/22 1822 08/30/22 2126   08/30/22 1800  cefTRIAXone (ROCEPHIN) 2 g in sodium chloride 0.9 % 100 mL IVPB  Status:  Discontinued        2 g 200 mL/hr over 30 Minutes Intravenous Every 24 hours 08/30/22 1018 09/01/22 0958   08/29/22 1500  ceFAZolin (ANCEF) IVPB 2g/100 mL premix  Status:  Discontinued        2 g 200 mL/hr over 30 Minutes Intravenous On call to O.R. 08/29/22 1454 08/29/22 1705   08/29/22 1430  ceFAZolin  (ANCEF) IVPB 2g/100 mL premix        2 g 200 mL/hr over 30 Minutes Intravenous  Once 08/29/22 1418 08/29/22 1456   08/29/22 1417  ceFAZolin (ANCEF) 2-4 GM/100ML-% IVPB       Note to Pharmacy: Cameron Sprang M: cabinet override      08/29/22 1417 08/29/22 1524   08/29/22 1400  piperacillin-tazobactam (ZOSYN) IVPB 2.25 g  Status:  Discontinued        2.25 g 100 mL/hr over 30 Minutes Intravenous Every 8 hours 08/29/22 0501 08/30/22 1018   08/29/22 0500  linezolid (ZYVOX) IVPB 600 mg  Status:  Discontinued        600 mg 300 mL/hr over 60 Minutes Intravenous Every 12 hours 08/29/22 0458 08/30/22  1738   08/29/22 0500  piperacillin-tazobactam (ZOSYN) IVPB 3.375 g        3.375 g 100 mL/hr over 30 Minutes Intravenous  Once 08/29/22 0459 08/29/22 0720         Subjective:  Seen and examined in dialysis unit.  He has no complaints.  He is fully alert and oriented.  Objective: Vitals:   09/05/22 0816 09/05/22 0830 09/05/22 0900 09/05/22 0924  BP: 120/70 109/66 (!) 132/92 122/72  Pulse: (!) 46 (!) 55 75   Resp: '12 11 19 12  '$ Temp:      TempSrc:      SpO2: 96% 98% 94%   Weight:      Height:        Intake/Output Summary (Last 24 hours) at 09/05/2022 0939 Last data filed at 09/04/2022 1800 Gross per 24 hour  Intake 360 ml  Output 320 ml  Net 40 ml    Filed Weights   09/02/22 0749 09/02/22 1122 09/05/22 0805  Weight: 105.1 kg 101.6 kg 104.2 kg    Examination:  General exam: Appears calm and comfortable  Respiratory system: Clear to auscultation. Respiratory effort normal. Cardiovascular system: S1 & S2 heard, RRR. No JVD, murmurs, rubs, gallops or clicks. No pedal edema. Gastrointestinal system: Abdomen is nondistended, soft and nontender. No organomegaly or masses felt. Normal bowel sounds heard. Central nervous system: Alert and oriented. No focal neurological deficits. Extremities: Dressing in the left thumb.  Ulcerations at the tip of all the fingers on the right hand and  left hand. Psychiatry: Judgement and insight appear normal. Mood & affect appropriate.   Data Reviewed: I have personally reviewed following labs and imaging studies  CBC: Recent Labs  Lab 08/30/22 0817 08/31/22 0800 09/01/22 1120 09/04/22 1024 09/05/22 0814  WBC 12.6* 13.1* 13.8* 14.3* 15.6*  NEUTROABS  --   --  12.3*  --   --   HGB 10.2* 9.6* 10.6* 10.3* 9.8*  HCT 33.4* 30.5* 33.4* 33.6* 30.8*  MCV 95.7 95.9 95.4 93.9 93.6  PLT 211 206 270 210 675    Basic Metabolic Panel: Recent Labs  Lab 08/30/22 0816 08/30/22 0817 08/31/22 0800 09/02/22 0702 09/02/22 0834 09/04/22 1024 09/05/22 0814  NA  --    < > 128* 127* 127* 129* 129*  K  --    < > 4.8 4.6 4.3 4.2 4.8  CL  --    < > 88* 89* 88* 91* 88*  CO2  --    < > '25 23 25 24 27  '$ GLUCOSE  --    < > 208* 117* 112* 158* 256*  BUN  --    < > 64* 76* 75* 72* 82*  CREATININE  --    < > 5.62* 5.92* 5.95* 6.18* 6.76*  CALCIUM  --    < > 8.7* 8.6* 8.6* 8.4* 9.2  PHOS 4.9*  --  7.4* 5.9* 5.8*  --  5.4*   < > = values in this interval not displayed.    GFR: Estimated Creatinine Clearance: 10.7 mL/min (A) (by C-G formula based on SCr of 6.76 mg/dL (H)). Liver Function Tests: Recent Labs  Lab 08/31/22 0800 09/02/22 0702 09/02/22 0834 09/05/22 0814  ALBUMIN 2.0* 2.0* 1.9* 2.0*    No results for input(s): "LIPASE", "AMYLASE" in the last 168 hours. No results for input(s): "AMMONIA" in the last 168 hours. Coagulation Profile: No results for input(s): "INR", "PROTIME" in the last 168 hours. Cardiac Enzymes: Recent Labs  Lab 08/31/22 0456  CKTOTAL 68    BNP (last 3 results) No results for input(s): "PROBNP" in the last 8760 hours. HbA1C: No results for input(s): "HGBA1C" in the last 72 hours. CBG: Recent Labs  Lab 09/04/22 1400 09/04/22 1425 09/04/22 1536 09/04/22 2010 09/05/22 0744  GLUCAP 130* 113* 122* 314* 271*    Lipid Profile: No results for input(s): "CHOL", "HDL", "LDLCALC", "TRIG", "CHOLHDL",  "LDLDIRECT" in the last 72 hours. Thyroid Function Tests: No results for input(s): "TSH", "T4TOTAL", "FREET4", "T3FREE", "THYROIDAB" in the last 72 hours. Anemia Panel: No results for input(s): "VITAMINB12", "FOLATE", "FERRITIN", "TIBC", "IRON", "RETICCTPCT" in the last 72 hours. Sepsis Labs: Recent Labs  Lab 09/01/22 1120  PROCALCITON 1.81     Recent Results (from the past 240 hour(s))  WOUND CULTURE     Status: Abnormal   Collection Time: 08/28/22  4:29 PM   Specimen: Thumb; Wound  Result Value Ref Range Status   MICRO NUMBER: [id number]  Final   SPECIMEN QUALITY: Adequate  Final   SOURCE: WOUND (SITE NOT SPECIFIED)  Final   STATUS: FINAL  Final   GRAM STAIN:   Final    No white blood cells seen No epithelial cells seen Many Gram positive cocci in clusters   ISOLATE 1: methicillin resistant Staphylococcus aureus (A)  Final    Comment: Heavy growth of Methicillin resistant Staphylococcus aureus (MRSA) Negative for inducible clindamycin resistance.      Susceptibility   Methicillin resistant staphylococcus aureus - AEROBIC CULT, GRAM STAIN POSITIVE 1    VANCOMYCIN 1 Sensitive     CIPROFLOXACIN >=8 Resistant     CLINDAMYCIN <=0.25 Sensitive     LEVOFLOXACIN 4 Intermediate     ERYTHROMYCIN >=8 Resistant     GENTAMICIN <=0.5 Sensitive     OXACILLIN* NR Resistant      * Oxacillin-resistant staphylococci are resistant to all currently available beta-lactam antimicrobial agents with the possible exception of ceftaroline.     TETRACYCLINE <=1 Sensitive     TRIMETH/SULFA* <=10 Sensitive      * Oxacillin-resistant staphylococci are resistant to all currently available beta-lactam antimicrobial agents with the possible exception of ceftaroline. Legend: S = Susceptible  I = Intermediate R = Resistant  NS = Not susceptible * = Not tested  NR = Not reported **NN = See antimicrobic comments   Blood culture (routine x 2)     Status: None   Collection Time: 08/28/22 11:30 PM    Specimen: BLOOD RIGHT HAND  Result Value Ref Range Status   Specimen Description BLOOD RIGHT HAND  Final   Special Requests   Final    BOTTLES DRAWN AEROBIC AND ANAEROBIC Blood Culture adequate volume   Culture   Final    NO GROWTH 5 DAYS Performed at [facility], [place] [address] [place], [place] [address]    Report Status 09/02/2022 FINAL  Final  Blood culture (routine x 2)     Status: None   Collection Time: 08/29/22  5:40 AM   Specimen: BLOOD RIGHT FOREARM  Result Value Ref Range Status   Specimen Description BLOOD RIGHT FOREARM  Final   Special Requests   Final    BOTTLES DRAWN AEROBIC AND ANAEROBIC Blood Culture results may not be optimal due to an inadequate volume of blood received in culture bottles   Culture   Final    NO GROWTH 5 DAYS Performed at [facility], [place] [address] [place], [place] [address]    Report Status 09/03/2022 FINAL  Final  MRSA Next Gen by PCR, Nasal     Status: Abnormal   Collection Time: 08/30/22  3:41 AM   Specimen: Nasal Mucosa; Nasal Swab  Result Value Ref Range Status   MRSA by PCR Next Gen DETECTED (A) NOT DETECTED Final    Comment: CRITICAL RESULT CALLED TO, READ BACK BY AND VERIFIED WITH:  C/ ERIC N., RN 08/30/22 0548 A. LAFRANCE (NOTE) The GeneXpert MRSA Assay (FDA approved for NASAL specimens only), is one component of a comprehensive MRSA colonization surveillance program. It is not intended to diagnose MRSA infection nor to guide or monitor treatment for MRSA infections. Test performance is not FDA approved in patients less than 22 years old. Performed at [facility], [place] [address], [place], [place] [address]   Aerobic/Anaerobic Culture w Gram Stain (surgical/deep wound)     Status: None   Collection Time: 08/30/22  1:43 PM   Specimen: PATH Other; Body Fluid  Result Value Ref Range Status   Specimen Description WOUND  Final   Special Requests LEFT THUMB ABSCESS SPEC A  Final   Gram Stain   Final    RARE WBC  PRESENT, PREDOMINANTLY MONONUCLEAR FEW GRAM POSITIVE COCCI IN PAIRS IN CLUSTERS RARE GRAM NEGATIVE RODS    Culture   Final    MODERATE METHICILLIN RESISTANT STAPHYLOCOCCUS AUREUS MODERATE HAEMOPHILUS PARAINFLUENZAE BETA LACTAMASE NEGATIVE NO ANAEROBES ISOLATED Performed at [facility], [place] [address], [place], [place] [address]    Report Status 09/04/2022 FINAL  Final   Organism ID, Bacteria METHICILLIN RESISTANT STAPHYLOCOCCUS AUREUS  Final      Susceptibility   Methicillin resistant staphylococcus aureus - MIC*    CIPROFLOXACIN 4 RESISTANT Resistant     ERYTHROMYCIN >=8 RESISTANT Resistant     GENTAMICIN <=0.5 SENSITIVE Sensitive     OXACILLIN >=4 RESISTANT Resistant     TETRACYCLINE <=1 SENSITIVE Sensitive     VANCOMYCIN 1 SENSITIVE Sensitive     TRIMETH/SULFA <=10 SENSITIVE Sensitive     CLINDAMYCIN <=0.25 SENSITIVE Sensitive     RIFAMPIN <=0.5 SENSITIVE Sensitive     Inducible Clindamycin NEGATIVE Sensitive     * MODERATE METHICILLIN RESISTANT STAPHYLOCOCCUS AUREUS  Aerobic/Anaerobic Culture w Gram Stain (surgical/deep wound)     Status: None   Collection Time: 08/30/22  1:51 PM   Specimen: PATH Other; Tissue  Result Value Ref Range Status   Specimen Description TISSUE  Final   Special Requests LEFT THUMB BONE  Final   Gram Stain   Final    NO WBC SEEN RARE GRAM POSITIVE COCCI IN CLUSTERS    Culture   Final    MODERATE METHICILLIN RESISTANT STAPHYLOCOCCUS AUREUS FEW HAEMOPHILUS PARAINFLUENZAE BETA LACTAMASE NEGATIVE NO ANAEROBES ISOLATED Performed at [facility], [address], [place], [place] [address]    Report Status 09/04/2022 FINAL  Final   Organism ID, Bacteria METHICILLIN RESISTANT STAPHYLOCOCCUS AUREUS  Final      Susceptibility   Methicillin resistant staphylococcus aureus - MIC*    CIPROFLOXACIN 4 RESISTANT Resistant     ERYTHROMYCIN >=8 RESISTANT Resistant     GENTAMICIN <=0.5 SENSITIVE Sensitive     OXACILLIN >=4 RESISTANT Resistant      TETRACYCLINE <=1 SENSITIVE Sensitive     VANCOMYCIN <=0.5 SENSITIVE Sensitive     TRIMETH/SULFA <=10 SENSITIVE Sensitive     CLINDAMYCIN <=0.25 SENSITIVE Sensitive     RIFAMPIN <=0.5 SENSITIVE Sensitive     Inducible Clindamycin NEGATIVE Sensitive     *  MODERATE METHICILLIN RESISTANT STAPHYLOCOCCUS AUREUS  Aerobic/Anaerobic Culture w Gram Stain (surgical/deep wound)     Status: None (Preliminary result)   Collection Time: 09/04/22  1:51 PM   Specimen: Soft Tissue, Other  Result Value Ref Range Status   Specimen Description TISSUE LEFT THUMB  Final   Special Requests REVISION AMPUTATION  Final   Gram Stain   Final    FEW WBC PRESENT,BOTH PMN AND MONONUCLEAR FEW GRAM POSITIVE COCCI IN CLUSTERS RARE GRAM NEGATIVE RODS    Culture   Final    TOO YOUNG TO READ Performed at [facility], [place] [address], [place], [place] [address]    Report Status PENDING  Incomplete     Radiology Studies: No results found.  Scheduled Meds:  Chlorhexidine Gluconate Cloth  6 each Topical Q0600   Chlorhexidine Gluconate Cloth  6 each Topical Q0600   darbepoetin (ARANESP) injection - DIALYSIS  60 mcg Intravenous Q Thu-HD   furosemide  80 mg Oral BID   insulin aspart  0-24 Units Subcutaneous TID WC   insulin aspart  4 Units Subcutaneous TID WC   insulin detemir  20 Units Subcutaneous Daily   midodrine  10 mg Oral Q T,Th,Sa-HD   rosuvastatin  10 mg Oral Daily   sevelamer carbonate  1,600 mg Oral TID WC   Continuous Infusions:  anticoagulant sodium citrate     cefTAZidime (FORTAZ)  IV 2 g (09/05/22 0921)   DAPTOmycin (CUBICIN) 650 mg in sodium chloride 0.9 % IVPB 650 mg (09/03/22 1952)     LOS: 6 days   Darliss Cheney, MD [facility]  09/05/2022, 9:39 AM   *Please note that this is a verbal dictation therefore any spelling or grammatical errors are due to the "[place]" system interpretation.  Please page via [place] and do not message via secure chat for urgent patient  care matters. Secure chat can be used for non urgent patient care matters.  How to contact the [facility] Attending or Consulting provider [place] or covering provider during after hours [place], for this patient?  Check the care team in [facility] and look for a) attending/consulting [facility] provider listed and b) the [facility] team listed. Page or secure chat [phone]. Log into [email or web address] and use [place]'s universal password to access. If you do not have the password, please contact the hospital operator. Locate the [facility] provider you are looking for under [facility] and page to a number that you can be directly reached. If you still have difficulty reaching the provider, please page the [facility] (Director on Call) for the Hospitalists listed on amion for assistance.

## 2022-09-05 NOTE — Anesthesia Preprocedure Evaluation (Addendum)
Anesthesia Evaluation  Patient identified by MRN, date of birth, ID band Patient awake    Reviewed: Allergy & Precautions, H&P , NPO status , Patient's Chart, lab work & pertinent test results  Airway Mallampati: II  TM Distance: >3 FB Neck ROM: Full    Dental no notable dental hx.    Pulmonary sleep apnea and Continuous Positive Airway Pressure Ventilation , former smoker   Pulmonary exam normal breath sounds clear to auscultation       Cardiovascular hypertension, + CAD and +CHF  + dysrhythmias Atrial Fibrillation  Rhythm:Irregular Rate:Normal     Neuro/Psych negative neurological ROS  negative psych ROS   GI/Hepatic negative GI ROS, Neg liver ROS,,,  Endo/Other  diabetes, Type 2    Renal/GU DialysisRenal diseaseDialysis today  negative genitourinary   Musculoskeletal negative musculoskeletal ROS (+)    Abdominal   Peds negative pediatric ROS (+)  Hematology  (+) Blood dyscrasia, anemia   Anesthesia Other Findings   Reproductive/Obstetrics negative OB ROS                            Anesthesia Physical Anesthesia Plan  ASA: 3  Anesthesia Plan: MAC   Post-op Pain Management: Minimal or no pain anticipated   Induction: Intravenous  PONV Risk Score and Plan: 1 and Propofol infusion and Treatment may vary due to age or medical condition  Airway Management Planned: Simple Face Mask  Additional Equipment:   Intra-op Plan:   Post-operative Plan:   Informed Consent: I have reviewed the patients History and Physical, chart, labs and discussed the procedure including the risks, benefits and alternatives for the proposed anesthesia with the patient or authorized representative who has indicated his/her understanding and acceptance.     Dental advisory given  Plan Discussed with: CRNA and Surgeon  Anesthesia Plan Comments:        Anesthesia Quick Evaluation

## 2022-09-06 DIAGNOSIS — M86042 Acute hematogenous osteomyelitis, left hand: Secondary | ICD-10-CM | POA: Diagnosis not present

## 2022-09-06 LAB — CBC WITH DIFFERENTIAL/PLATELET
Abs Immature Granulocytes: 0.43 10*3/uL — ABNORMAL HIGH (ref 0.00–0.07)
Basophils Absolute: 0 10*3/uL (ref 0.0–0.1)
Basophils Relative: 0 %
Eosinophils Absolute: 0 10*3/uL (ref 0.0–0.5)
Eosinophils Relative: 0 %
HCT: 30.8 % — ABNORMAL LOW (ref 39.0–52.0)
Hemoglobin: 9.5 g/dL — ABNORMAL LOW (ref 13.0–17.0)
Immature Granulocytes: 3 %
Lymphocytes Relative: 6 %
Lymphs Abs: 1 10*3/uL (ref 0.7–4.0)
MCH: 29.1 pg (ref 26.0–34.0)
MCHC: 30.8 g/dL (ref 30.0–36.0)
MCV: 94.2 fL (ref 80.0–100.0)
Monocytes Absolute: 1.3 10*3/uL — ABNORMAL HIGH (ref 0.1–1.0)
Monocytes Relative: 8 %
Neutro Abs: 14.2 10*3/uL — ABNORMAL HIGH (ref 1.7–7.7)
Neutrophils Relative %: 83 %
Platelets: 222 10*3/uL (ref 150–400)
RBC: 3.27 MIL/uL — ABNORMAL LOW (ref 4.22–5.81)
RDW: 19 % — ABNORMAL HIGH (ref 11.5–15.5)
WBC: 16.9 10*3/uL — ABNORMAL HIGH (ref 4.0–10.5)
nRBC: 0 % (ref 0.0–0.2)

## 2022-09-06 LAB — GLUCOSE, CAPILLARY
Glucose-Capillary: 201 mg/dL — ABNORMAL HIGH (ref 70–99)
Glucose-Capillary: 182 mg/dL — ABNORMAL HIGH (ref 70–99)
Glucose-Capillary: 304 mg/dL — ABNORMAL HIGH (ref 70–99)
Glucose-Capillary: 257 mg/dL — ABNORMAL HIGH (ref 70–99)

## 2022-09-06 MED ORDER — SODIUM CHLORIDE 0.9 % IV SOLN
8.0000 mg/kg | INTRAVENOUS | Status: DC
  Administered 2022-09-07 – 2022-09-12 (×2): 650 mg via INTRAVENOUS
  Filled 2022-09-06 (×2): qty 13

## 2022-09-06 MED ORDER — INSULIN DETEMIR 100 UNIT/ML ~~LOC~~ SOLN
30.0000 [IU] | Freq: Every day | SUBCUTANEOUS | Status: DC
  Administered 2022-09-06 – 2022-09-13 (×8): 30 [IU] via SUBCUTANEOUS
  Filled 2022-09-06 (×9): qty 0.3

## 2022-09-06 MED ORDER — CHLORHEXIDINE GLUCONATE CLOTH 2 % EX PADS
6.0000 | MEDICATED_PAD | Freq: Every day | CUTANEOUS | Status: DC
  Administered 2022-09-07 – 2022-09-08 (×2): 6 via TOPICAL

## 2022-09-06 MED ORDER — SODIUM CHLORIDE 0.9 % IV SOLN
12.0000 mg/kg | INTRAVENOUS | Status: DC
  Administered 2022-09-09: 950 mg via INTRAVENOUS
  Filled 2022-09-06: qty 19

## 2022-09-06 NOTE — Progress Notes (Signed)
Occupational Therapy Treatment Patient Details Name: KADAN MILLSTEIN MRN: [id number] DOB: 1946-04-11 Today's Date: 09/06/2022   History of present illness Patient is a 76 y/o male who presents on 10/9 with drainage, pain and swelling of left hand/thumb. 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 PMH includes CAD,. CHF, DM, ESRD on HD, HTN, paroxysmal A-fib, macular degeneration.   OT comments  Co-treatment with PT with OT focus on functional mobility, ADL transfers, and functional activity tolerance. Pt requiring cueing for presumed LUE NWB- often attempting to use his L hand for weightbearing. Edu provided on elevation and use of ice for swelling and bleeding management. Pt required mod +2 assist throughout session for ADL transfers, as well as blocked practice sit <> stands. Continue to recommend SNF. OT POC remains appropriate.    Recommendations for follow up therapy are one component of a multi-disciplinary discharge planning process, led by the attending physician.  Recommendations may be updated based on patient status, additional functional criteria and insurance authorization.    Follow Up Recommendations  Skilled nursing-short term rehab (<3 hours/day)    Assistance Recommended at Discharge Frequent or constant Supervision/Assistance  Patient can return home with the following  A lot of help with walking and/or transfers;A lot of help with bathing/dressing/bathroom   Equipment Recommendations  Wheelchair (measurements OT)    Recommendations for Other Services      Precautions / Restrictions Precautions Precautions: Fall Restrictions Weight Bearing Restrictions: No Other Position/Activity Restrictions: assume NWB on L hand       Mobility Bed Mobility Overal bed mobility: Needs Assistance Bed Mobility: Supine to Sit     Supine to sit: Mod  assist     General bed mobility comments: increased time to complete. Cues for reaching with R hand to bed rail to assist. Assist for trunk elevation and repositioning hips towards EOB    Transfers Overall transfer level: Needs assistance Equipment used: 1 person hand held assist Transfers: Sit to/from Stand, Bed to chair/wheelchair/BSC Sit to Stand: Mod assist, +2 physical assistance     Step pivot transfers: Mod assist, +2 physical assistance     General transfer comment: modA+2 to stand from bed and recliner x 3. Posterior bias during steps towards recliner and sitting prematurely despite cues     Balance Overall balance assessment: Needs assistance Sitting-balance support: Feet supported, No upper extremity supported Sitting balance-Leahy Scale: Fair Sitting balance - Comments: supervision for safety. Postural control: Posterior lean Standing balance support: Single extremity supported, During functional activity Standing balance-Leahy Scale: Poor Standing balance comment: modA+2 to maintain standing balance                           ADL either performed or assessed with clinical judgement   ADL                                              Extremity/Trunk Assessment Upper Extremity Assessment Upper Extremity Assessment: LUE deficits/detail RUE Deficits / Details: R hand wounds chronic with poor sensation. Able to grasp functional items RUE Sensation: history of peripheral neuropathy RUE Coordination: decreased fine motor;decreased gross motor LUE Deficits / Details: S/p L thumb amputation LUE: Unable to fully assess due to  immobilization LUE Sensation: history of peripheral neuropathy LUE Coordination: decreased fine motor   Lower Extremity Assessment Lower Extremity Assessment: Defer to PT evaluation        Vision       Perception     Praxis      Cognition Arousal/Alertness: Awake/alert Behavior During Therapy: WFL for  tasks assessed/performed Overall Cognitive Status: Impaired/Different from baseline Area of Impairment: Problem solving                             Problem Solving: Slow processing, Decreased initiation, Difficulty sequencing, Requires verbal cues, Requires tactile cues General Comments: flat affect, seems to require increased time to process tasks        Exercises      Shoulder Instructions       General Comments daughter present    Pertinent Vitals/ Pain       Pain Assessment Pain Assessment: No/denies pain Pain Score: 3  Pain Location: L hand Pain Descriptors / Indicators: Sore, Discomfort, Operative site guarding Pain Intervention(s): Monitored during session  Home Living                                          Prior Functioning/Environment              Frequency  Min 2X/week        Progress Toward Goals  OT Goals(current goals can now be found in the care plan section)  Progress towards OT goals: Progressing toward goals  Acute Rehab OT Goals Patient Stated Goal: Get bleeding to stop in hand OT Goal Formulation: With patient/family Time For Goal Achievement: 09/15/22 Potential to Achieve Goals: Good  Plan Discharge plan remains appropriate    Co-evaluation    PT/OT/SLP Co-Evaluation/Treatment: Yes Reason for Co-Treatment: Complexity of the patient's impairments (multi-system involvement);To address functional/ADL transfers   OT goals addressed during session: Proper use of Adaptive equipment and DME;Strengthening/ROM      AM-PAC OT "6 Clicks" Daily Activity     Outcome Measure   Help from another person eating meals?: A Little Help from another person taking care of personal grooming?: A Lot Help from another person toileting, which includes using toliet, bedpan, or urinal?: A Lot Help from another person bathing (including washing, rinsing, drying)?: A Lot Help from another person to put on and taking off  regular upper body clothing?: A Lot Help from another person to put on and taking off regular lower body clothing?: A Lot 6 Click Score: 13    End of Session Equipment Utilized During Treatment: Gait belt  OT Visit Diagnosis: Unsteadiness on feet (R26.81);Muscle weakness (generalized) (M62.81)   Activity Tolerance Patient tolerated treatment well   Patient Left in chair;with call bell/phone within reach;with chair alarm set;with family/visitor present   Nurse Communication Mobility status        Time: 4782-9562 OT Time Calculation (min): 23 min  Charges: OT General Charges $OT Visit: 1 Visit OT Treatments $Therapeutic Activity: 8-22 mins  Laverle Hobby, OTR/L, CBIS Acute Rehab Office: [facility] 09/06/2022, 1:23 PM

## 2022-09-06 NOTE — Progress Notes (Signed)
Pharmacy Antibiotic Note  Louis [name] is a 76 y.o. male on antibiotics for polymicrobial osteomyelitis. Pharmacy has been consulted for daptomycin dosing per ID recommendations. Patient is s/p I&D and L thumb amputation on 10/17.  ESRD, usual TTS HD - HD regimen has been on schedule, will transition daptomycin dosing from q48h to TTS w/ HD. Last dose 10/18 ~midnight.   Planning 6 weeks IV antibiotics.  Plan: Continue ceftazidime 2gm IV TTS after HD. Transition Daptomycin to '650mg'$  ('8mg'$ /kg) IV on Tues/Thurs after HD with '950mg'$  given on Saturday after HD to cover for intradialytic period. CK weekly while on Daptomycin.  Height: '5\' 7"'$  (170.2 cm) Weight: 100.4 kg (221 lb 5.5 oz) IBW/kg (Calculated) : 66.1  Temp (24hrs), Avg:97.8 F (36.6 C), Min:97.5 F (36.4 C), Max:98 F (36.7 C)  Recent Labs  Lab 08/31/22 0800 09/01/22 1120 09/02/22 0702 09/02/22 0834 09/04/22 1024 09/05/22 0814 09/06/22 0312  WBC 13.1* 13.8*  --   --  14.3* 15.6* 16.9*  CREATININE 5.62*  --  5.92* 5.95* 6.18* 6.76*  --      ESRD   Allergies  Allergen Reactions   Hydrocodone Nausea And Vomiting    Other reaction(s): GI Upset (intolerance) Projectile vomiting    Oxycodone Nausea And Vomiting    Other reaction(s): GI Upset (intolerance), Vomiting (intolerance) Projectile vomiting    Vancomycin Anaphylaxis    ORAL VANCOMYCIN for C diff.   Dacarbazine Other (See Comments)    Unknown reaction   Latex Itching and Rash   Tape Dermatitis, Itching and Rash    Patch used at dialysis    Antimicrobials this admission: Linezolid 10/10>> 10/11 Zosyn 10/10 >> 10/11 Mupirocin nasal 10/11>>(10/15) Daptomycin 10/11 >> Ceftriaxone 10/11 >>10/13 (LD 10/12) Ceftazadime 10/13 >>  Microbiology results: 10/9 Bcx: ng x 4 days to date 10/10 Bcx: ng x 3 days to date 10/11 MRSA PCR: detected 10/11 L thumb abscess: mod MRSA, mod Haemophilus 10/11 L thumb bone: mod MRSA, few Haemophilus 10/16 L thumb tissue:  few GPC, rare GNR - mod staph aureus  Thank you for allowing pharmacy to be a part of this patient's care.  Dimple Nanas, PharmD, BCPS 09/06/2022 3:33 PM

## 2022-09-06 NOTE — Progress Notes (Signed)
PROGRESS NOTE    Louis [name]  GGY:[id number] DOB: 1946-09-29 DOA: 08/28/2022 PCP: [name], MD   Brief Narrative:  Louis [name] is a 76 y.o. with history of ESRD on hemodialysis, type 2 diabetes, CHF, CAD, hyperlipidemia, hypertension presented to the ER at the recommendation of his family medicine physician.  Patient had left hand swelling  for the last 2 days.  He has noticed bloody drainage so he presented to the ER.  He has had wounds on his hands for a year however never to this severity.  In the ER he had x-rays obtained which demonstrated extensive soft tissue swelling of the left thumb concern for osteomyelitis. Hand surgery was consulted in the ED the patient was admitted for IV antibiotics.  Further details below.  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  Patient underwent left upper arm AV fistula ligation and placement of left IJ TDC by vascular surgery on 08/29/2022.  He then underwent Left thumb flexor sheath incision and drainage by Dr. Caralyn Guile on 08/30/2022. Intraoperative cultures are growing MRSA.  ID on board but signed off, currently patient  daptomycin and Fortaz per ID and they recommend continuing these for 6 weeks with dialysis  and follow-up with ID in the clinic on 10/04/2022.   Vascular surgery also saw him and per them, he has good blood flow so no angiogram is needed.  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 Caralyn Guile on 09/04/2022.  Reportedly patient was profusely bleeding from the surgical site yes today and overnight and that the hand surgery has been notified and patient is waiting for assessment by them.  Nursing to notify them again today.  Wife also raises the concern that she has several questions and concerns about his hand and would like to talk to the hand surgeon.  ESRD on HD: Nephrology on board.  Hyperlipidemia: Continue Crestor.  Type 2 diabetes mellitus: Blood sugar elevated, he is on Semglee 20 units daily.  Will increase that to 30 units.  Continue SSI.  Acute hyponatremia: We will defer to nephrology since he is a dialysis patient.  Permanent atrial fibrillation: Rates controlled.  He is not on any rate control medications. Orthopedics cleared him to resume Eliquis on 09/01/2022.  However this is on hold since the morning dose of 09/03/2022 for planned EGD which was completed yesterday however now that he is bleeding from the left hand, I will continue to hold this until he is cleared to resume this by hand surgeon.   Dysphagia: Patient was noted to have some aspiration event on 08/31/2022 with the nurses.  Evaluated by SLP,  they suspected esophageal dysphagia and recommended esophagogram which is completed and shows distal esophageal stenosis.  Seen by GI, underwent EGD and esophageal dilatation on 09/05/2022.  Currently on full liquid diet, will advance to soft diet today.  Patient has no complaints regarding this.  He was also found to have nonbleeding gastric ulcer with clean base which was biopsied and erythematous mucosa in gastric  body.  It was biopsied as well.    DVT prophylaxis: SCD's Start: 09/04/22 2028 SCD's Start: 08/30/22 1455 SCDs Start: 08/29/22 0549    Code Status: Full Code  Family Communication: Wife present at bedside.  Plan of care discussed with patient in length and he/she verbalized understanding and agreed with it.  Status is: Inpatient Remains inpatient appropriate because: Patient needs evaluation by hand surgery due to intermittent bleeding.  Otherwise medically stable.    Estimated body mass index is 34.67 kg/m as calculated from the following:   Height as of this encounter: '5\' 7"'$  (1.702 m).   Weight as of this encounter: 100.4 kg.    Nutritional Assessment: Body mass index is 34.67 kg/m.[place] Kitchen Seen by dietician.  I agree with the assessment and plan as outlined below: Nutrition Status:        . Skin Assessment: I have examined the patient's skin and I agree with the wound assessment as performed by the wound care RN as outlined below:    Consultants:  Ortho  ID Vascular surgery Nephrology GI  Procedures:  As above  Antimicrobials:  Anti-infectives (From admission, onward)    Start     Dose/Rate Route Frequency Ordered Stop   09/02/22 1200  cefTAZidime (FORTAZ) 2 g in sodium chloride 0.9 % 100 mL IVPB        2 g 200 mL/hr over 30 Minutes Intravenous Every T-Th-Sa (Hemodialysis) 09/01/22 1654     09/01/22 2000  DAPTOmycin (CUBICIN) 650 mg in sodium chloride 0.9 % IVPB        8 mg/kg  81.9 kg (Adjusted) 126 mL/hr over 30 Minutes Intravenous Every 48 hours 08/31/22 0738     09/01/22 1830  cefTAZidime (FORTAZ) 1 g in sodium chloride 0.9 % 100 mL IVPB        1 g 200 mL/hr over 30 Minutes Intravenous  Once 09/01/22 1654 09/01/22 2212   08/31/22 2200  DAPTOmycin (CUBICIN) 650 mg in sodium chloride 0.9 % IVPB  Status:  Discontinued        6 mg/kg  105.7 kg 126 mL/hr over 30 Minutes Intravenous Every 48 hours 08/30/22 1822 08/31/22 0738   08/30/22 1915  DAPTOmycin (CUBICIN)  650 mg in sodium chloride 0.9 % IVPB        6 mg/kg  105.7 kg 126 mL/hr over 30 Minutes Intravenous  Once 08/30/22 1822 08/30/22 2126   08/30/22 1800  cefTRIAXone (ROCEPHIN) 2 g in sodium chloride 0.9 % 100 mL IVPB  Status:  Discontinued        2 g 200 mL/hr over 30 Minutes Intravenous Every 24 hours 08/30/22 1018 09/01/22 0958   08/29/22 1500  ceFAZolin (ANCEF) IVPB 2g/100 mL premix  Status:  Discontinued        2 g 200 mL/hr over 30 Minutes Intravenous On call to O.R. 08/29/22 1454 08/29/22 1705   08/29/22 1430  ceFAZolin (ANCEF) IVPB 2g/100 mL premix        2 g 200 mL/hr over 30 Minutes Intravenous  Once 08/29/22 1418 08/29/22 1456   08/29/22 1417  ceFAZolin (ANCEF) 2-4 GM/100ML-%  IVPB       Note to Pharmacy: Cameron Sprang M: cabinet override      08/29/22 1417 08/29/22 1524   08/29/22 1400  piperacillin-tazobactam (ZOSYN) IVPB 2.25 g  Status:  Discontinued        2.25 g 100 mL/hr over 30 Minutes Intravenous Every 8 hours 08/29/22 0501 08/30/22 1018   08/29/22 0500  linezolid (ZYVOX) IVPB 600 mg  Status:  Discontinued        600 mg 300 mL/hr over 60 Minutes Intravenous Every 12 hours 08/29/22 0458 08/30/22 1738   08/29/22 0500  piperacillin-tazobactam (ZOSYN) IVPB 3.375 g        3.375 g 100 mL/hr over 30 Minutes Intravenous  Once 08/29/22 0459 08/29/22 0720         Subjective:  Patient seen and examined.  Currently patient himself does not have any complaint.  Wife at the bedside.  Objective: Vitals:   09/05/22 1514 09/05/22 2118 09/06/22 0335 09/06/22 0738  BP: 124/77 118/65 120/73   Pulse: 62 (!) 54 62   Resp: 18 18    Temp: 97.9 F (36.6 C) (!) 97.5 F (36.4 C) 97.8 F (36.6 C) 98 F (36.7 C)  TempSrc: Oral Oral Oral Oral  SpO2: 100% 100% 100%   Weight:      Height:        Intake/Output Summary (Last 24 hours) at 09/06/2022 0947 Last data filed at 09/06/2022 0039 Gross per 24 hour  Intake 390 ml  Output 4700 ml  Net -4310 ml    Filed Weights    09/05/22 0805 09/05/22 1250 09/05/22 1305  Weight: 104.2 kg 100.4 kg 100.4 kg    Examination:  General exam: Appears calm and comfortable  Respiratory system: Clear to auscultation. Respiratory effort normal. Cardiovascular system: S1 & S2 heard, RRR. No JVD, murmurs, rubs, gallops or clicks. No pedal edema. Gastrointestinal system: Abdomen is nondistended, soft and nontender. No organomegaly or masses felt. Normal bowel sounds heard. Central nervous system: Alert and oriented. No focal neurological deficits. Extremities: Dressing in the left thumb. Psychiatry: Judgement and insight appear normal. Mood & affect appropriate.   Data Reviewed: I have personally reviewed following labs and imaging studies  CBC: Recent Labs  Lab 08/31/22 0800 09/01/22 1120 09/04/22 1024 09/05/22 0814 09/06/22 0312  WBC 13.1* 13.8* 14.3* 15.6* 16.9*  NEUTROABS  --  12.3*  --   --  14.2*  HGB 9.6* 10.6* 10.3* 9.8* 9.5*  HCT 30.5* 33.4* 33.6* 30.8* 30.8*  MCV 95.9 95.4 93.9 93.6 94.2  PLT 206 270 210 212 935    Basic Metabolic Panel: Recent Labs  Lab 08/31/22 0800 09/02/22 0702 09/02/22 0834 09/04/22 1024 09/05/22 0814  NA 128* 127* 127* 129* 129*  K 4.8 4.6 4.3 4.2 4.8  CL 88* 89* 88* 91* 88*  CO2 '25 23 25 24 27  '$ GLUCOSE 208* 117* 112* 158* 256*  BUN 64* 76* 75* 72* 82*  CREATININE 5.62* 5.92* 5.95* 6.18* 6.76*  CALCIUM 8.7* 8.6* 8.6* 8.4* 9.2  PHOS 7.4* 5.9* 5.8*  --  5.4*    GFR: Estimated Creatinine Clearance: 10.5 mL/min (A) (by C-G formula based on SCr of 6.76 mg/dL (H)). Liver Function Tests: Recent Labs  Lab 08/31/22 0800 09/02/22 0702 09/02/22 0834 09/05/22 0814  ALBUMIN 2.0* 2.0* 1.9* 2.0*    No results for input(s): "LIPASE", "AMYLASE" in the last 168 hours. No results for input(s): "AMMONIA" in the last 168 hours. Coagulation Profile: No results for input(s): "INR", "PROTIME"  in the last 168 hours. Cardiac Enzymes: Recent Labs  Lab 08/31/22 0456  CKTOTAL 68     BNP (last 3 results) No results for input(s): "PROBNP" in the last 8760 hours. HbA1C: No results for input(s): "HGBA1C" in the last 72 hours. CBG: Recent Labs  Lab 09/05/22 0744 09/05/22 1354 09/05/22 1608 09/05/22 1958 09/06/22 0732  GLUCAP 271* 146* 161* 293* 201*    Lipid Profile: No results for input(s): "CHOL", "HDL", "LDLCALC", "TRIG", "CHOLHDL", "LDLDIRECT" in the last 72 hours. Thyroid Function Tests: No results for input(s): "TSH", "T4TOTAL", "FREET4", "T3FREE", "THYROIDAB" in the last 72 hours. Anemia Panel: No results for input(s): "VITAMINB12", "FOLATE", "FERRITIN", "TIBC", "IRON", "RETICCTPCT" in the last 72 hours. Sepsis Labs: Recent Labs  Lab 09/01/22 1120  PROCALCITON 1.81     Recent Results (from the past 240 hour(s))  WOUND CULTURE     Status: Abnormal   Collection Time: 08/28/22  4:29 PM   Specimen: Thumb; Wound  Result Value Ref Range Status   MICRO NUMBER: [id number]  Final   SPECIMEN QUALITY: Adequate  Final   SOURCE: WOUND (SITE NOT SPECIFIED)  Final   STATUS: FINAL  Final   GRAM STAIN:   Final    No white blood cells seen No epithelial cells seen Many Gram positive cocci in clusters   ISOLATE 1: methicillin resistant Staphylococcus aureus (A)  Final    Comment: Heavy growth of Methicillin resistant Staphylococcus aureus (MRSA) Negative for inducible clindamycin resistance.      Susceptibility   Methicillin resistant staphylococcus aureus - AEROBIC CULT, GRAM STAIN POSITIVE 1    VANCOMYCIN 1 Sensitive     CIPROFLOXACIN >=8 Resistant     CLINDAMYCIN <=0.25 Sensitive     LEVOFLOXACIN 4 Intermediate     ERYTHROMYCIN >=8 Resistant     GENTAMICIN <=0.5 Sensitive     OXACILLIN* NR Resistant      * Oxacillin-resistant staphylococci are resistant to all currently available beta-lactam antimicrobial agents with the possible exception of ceftaroline.     TETRACYCLINE <=1 Sensitive     TRIMETH/SULFA* <=10 Sensitive      * Oxacillin-resistant  staphylococci are resistant to all currently available beta-lactam antimicrobial agents with the possible exception of ceftaroline. Legend: S = Susceptible  I = Intermediate R = Resistant  NS = Not susceptible * = Not tested  NR = Not reported **NN = See antimicrobic comments   Blood culture (routine x 2)     Status: None   Collection Time: 08/28/22 11:30 PM   Specimen: BLOOD RIGHT HAND  Result Value Ref Range Status   Specimen Description BLOOD RIGHT HAND  Final   Special Requests   Final    BOTTLES DRAWN AEROBIC AND ANAEROBIC Blood Culture adequate volume   Culture   Final    NO GROWTH 5 DAYS Performed at [facility], [place] [address] [place], [place] [address]    Report Status 09/02/2022 FINAL  Final  Blood culture (routine x 2)     Status: None   Collection Time: 08/29/22  5:40 AM   Specimen: BLOOD RIGHT FOREARM  Result Value Ref Range Status   Specimen Description BLOOD RIGHT FOREARM  Final   Special Requests   Final    BOTTLES DRAWN AEROBIC AND ANAEROBIC Blood Culture results may not be optimal due to an inadequate volume of blood received in culture bottles   Culture   Final    NO GROWTH 5 DAYS Performed at [facility], [address] [place], [place] [address]    Report Status 09/03/2022 FINAL  Final  MRSA Next Gen by PCR, Nasal     Status: Abnormal   Collection Time: 08/30/22  3:41 AM   Specimen: Nasal Mucosa; Nasal Swab  Result Value Ref Range Status   MRSA by PCR Next Gen DETECTED (A) NOT DETECTED Final    Comment: CRITICAL RESULT CALLED TO, READ BACK BY AND VERIFIED WITH:  C/ ERIC N., RN 08/30/22 0548 A. LAFRANCE (NOTE) The GeneXpert MRSA Assay (FDA approved for NASAL specimens only), is one component of a comprehensive MRSA colonization surveillance program. It is not intended to diagnose MRSA infection nor to guide or monitor treatment for MRSA infections. Test performance is not FDA approved in patients less than 18 years old. Performed at  [facility], [place] [address], [place], [place] [address]   Aerobic/Anaerobic Culture w Gram Stain (surgical/deep wound)     Status: None   Collection Time: 08/30/22  1:43 PM   Specimen: PATH Other; Body Fluid  Result Value Ref Range Status   Specimen Description WOUND  Final   Special Requests LEFT THUMB ABSCESS SPEC A  Final   Gram Stain   Final    RARE WBC PRESENT, PREDOMINANTLY MONONUCLEAR FEW GRAM POSITIVE COCCI IN PAIRS IN CLUSTERS RARE GRAM NEGATIVE RODS    Culture   Final    MODERATE METHICILLIN RESISTANT STAPHYLOCOCCUS AUREUS MODERATE HAEMOPHILUS PARAINFLUENZAE BETA LACTAMASE NEGATIVE NO ANAEROBES ISOLATED Performed at [facility], [place] [address], [place], [place] [address]    Report Status 09/04/2022 FINAL  Final   Organism ID, Bacteria METHICILLIN RESISTANT STAPHYLOCOCCUS AUREUS  Final      Susceptibility   Methicillin resistant staphylococcus aureus - MIC*    CIPROFLOXACIN 4 RESISTANT Resistant     ERYTHROMYCIN >=8 RESISTANT Resistant     GENTAMICIN <=0.5 SENSITIVE Sensitive     OXACILLIN >=4 RESISTANT Resistant     TETRACYCLINE <=1 SENSITIVE Sensitive     VANCOMYCIN 1 SENSITIVE Sensitive     TRIMETH/SULFA <=10 SENSITIVE Sensitive     CLINDAMYCIN <=0.25 SENSITIVE Sensitive     RIFAMPIN <=0.5 SENSITIVE Sensitive     Inducible Clindamycin NEGATIVE Sensitive     * MODERATE METHICILLIN RESISTANT STAPHYLOCOCCUS AUREUS  Aerobic/Anaerobic Culture w Gram Stain (surgical/deep wound)     Status: None   Collection Time: 08/30/22  1:51 PM   Specimen: PATH Other; Tissue  Result Value Ref Range Status   Specimen Description TISSUE  Final   Special Requests LEFT THUMB BONE  Final   Gram Stain   Final    NO WBC SEEN RARE GRAM POSITIVE COCCI IN CLUSTERS    Culture   Final    MODERATE METHICILLIN RESISTANT STAPHYLOCOCCUS AUREUS FEW HAEMOPHILUS PARAINFLUENZAE BETA LACTAMASE NEGATIVE NO ANAEROBES ISOLATED Performed at [facility], [address]  [place], [place] [address]    Report Status 09/04/2022 FINAL  Final   Organism ID, Bacteria METHICILLIN RESISTANT STAPHYLOCOCCUS AUREUS  Final      Susceptibility   Methicillin resistant staphylococcus aureus - MIC*    CIPROFLOXACIN 4 RESISTANT Resistant     ERYTHROMYCIN >=8 RESISTANT Resistant     GENTAMICIN <=0.5 SENSITIVE Sensitive     OXACILLIN >=4 RESISTANT Resistant     TETRACYCLINE <=1 SENSITIVE Sensitive     VANCOMYCIN <=0.5 SENSITIVE Sensitive     TRIMETH/SULFA <=10 SENSITIVE Sensitive     CLINDAMYCIN <=0.25 SENSITIVE Sensitive     RIFAMPIN <=0.5  SENSITIVE Sensitive     Inducible Clindamycin NEGATIVE Sensitive     * MODERATE METHICILLIN RESISTANT STAPHYLOCOCCUS AUREUS  Aerobic/Anaerobic Culture w Gram Stain (surgical/deep wound)     Status: None (Preliminary result)   Collection Time: 09/04/22  1:51 PM   Specimen: Soft Tissue, Other  Result Value Ref Range Status   Specimen Description TISSUE LEFT THUMB  Final   Special Requests REVISION AMPUTATION  Final   Gram Stain   Final    FEW WBC PRESENT,BOTH PMN AND MONONUCLEAR FEW GRAM POSITIVE COCCI IN CLUSTERS RARE GRAM NEGATIVE RODS    Culture   Final    TOO YOUNG TO READ Performed at [facility], [place] [address], [place], [place] [address]    Report Status PENDING  Incomplete     Radiology Studies: No results found.  Scheduled Meds:  Chlorhexidine Gluconate Cloth  6 each Topical Q0600   Chlorhexidine Gluconate Cloth  6 each Topical Q0600   darbepoetin (ARANESP) injection - DIALYSIS  60 mcg Intravenous Q Thu-HD   furosemide  80 mg Oral BID   insulin aspart  0-24 Units Subcutaneous TID WC   insulin aspart  4 Units Subcutaneous TID WC   insulin detemir  30 Units Subcutaneous Daily   midodrine  10 mg Oral Q T,Th,Sa-HD   pantoprazole  40 mg Oral BID   rosuvastatin  10 mg Oral Daily   sevelamer carbonate  1,600 mg Oral TID WC   Continuous Infusions:  cefTAZidime (FORTAZ)  IV 2 g (09/05/22 1605)   DAPTOmycin  (CUBICIN) 650 mg in sodium chloride 0.9 % IVPB 650 mg (09/06/22 0039)     LOS: 7 days   Darliss Cheney, MD [facility]  09/06/2022, 9:47 AM   *Please note that this is a verbal dictation therefore any spelling or grammatical errors are due to the "[place]" system interpretation.  Please page via [place] and do not message via secure chat for urgent patient care matters. Secure chat can be used for non urgent patient care matters.  How to contact the [facility] Attending or Consulting provider [place] or covering provider during after hours [place], for this patient?  Check the care team in [facility] and look for a) attending/consulting [facility] provider listed and b) the [facility] team listed. Page or secure chat [phone]. Log into [email or web address] and use [place]'s universal password to access. If you do not have the password, please contact the hospital operator. Locate the [facility] provider you are looking for under [facility] and page to a number that you can be directly reached. If you still have difficulty reaching the provider, please page the [facility] (Director on Call) for the Hospitalists listed on amion for assistance.

## 2022-09-06 NOTE — Progress Notes (Signed)
Patient family/niece called and voided concerns about bleeding at surgery site. Arm is elevated at heart level. Dressing changed and copious amount of blood on dressing removed. Notified on-call NP Olena Heckle of bleeding and family concerns. David from rapid came and assessed patient.

## 2022-09-06 NOTE — Progress Notes (Signed)
[facility] Progress Note   Subjective:   Patient seen and examined at bedside with family present.  Reports improvement in swallowing post esophageal dilation, now cleared for regular diet.  Had some bleeding from his hand overnight and waiting to talk to hand surgeon.  Denies CP, SOB, abdominal pain and n/v/d.  Continues to have LE edema.  Tolerated dialysis well yesterday.   Objective Vitals:   09/05/22 1514 09/05/22 2118 09/06/22 0335 09/06/22 0738  BP: 124/77 118/65 120/73   Pulse: 62 (!) 54 62   Resp: 18 18    Temp: 97.9 F (36.6 C) (!) 97.5 F (36.4 C) 97.8 F (36.6 C) 98 F (36.7 C)  TempSrc: Oral Oral Oral Oral  SpO2: 100% 100% 100%   Weight:      Height:       Physical Exam General:chronically ill appearing male in NAD Heart:RRR, no mrg Lungs:CTAB, nml WOB on RA Abdomen:soft, NTND Extremities:1-2+ LE edema R>L, R hand/forearm dressed, loose ACE wrap covering with some blood present on underside Dialysis Access: [facility]   Filed Weights   09/05/22 0805 09/05/22 1250 09/05/22 1305  Weight: 104.2 kg 100.4 kg 100.4 kg    Intake/Output Summary (Last 24 hours) at 09/06/2022 1233 Last data filed at 09/06/2022 0039 Gross per 24 hour  Intake 390 ml  Output 4700 ml  Net -4310 ml    Additional Objective Labs: Basic Metabolic Panel: Recent Labs  Lab 09/02/22 0702 09/02/22 0834 09/04/22 1024 09/05/22 0814  NA 127* 127* 129* 129*  K 4.6 4.3 4.2 4.8  CL 89* 88* 91* 88*  CO2 '23 25 24 27  '$ GLUCOSE 117* 112* 158* 256*  BUN 76* 75* 72* 82*  CREATININE 5.92* 5.95* 6.18* 6.76*  CALCIUM 8.6* 8.6* 8.4* 9.2  PHOS 5.9* 5.8*  --  5.4*   Liver Function Tests: Recent Labs  Lab 09/02/22 0702 09/02/22 0834 09/05/22 0814  ALBUMIN 2.0* 1.9* 2.0*   CBC: Recent Labs  Lab 08/31/22 0800 09/01/22 1120 09/04/22 1024 09/05/22 0814 09/06/22 0312  WBC 13.1* 13.8* 14.3* 15.6* 16.9*  NEUTROABS  --  12.3*  --   --  14.2*  HGB 9.6* 10.6* 10.3* 9.8* 9.5*  HCT  30.5* 33.4* 33.6* 30.8* 30.8*  MCV 95.9 95.4 93.9 93.6 94.2  PLT 206 270 210 212 222   Blood Culture    Component Value Date/Time   SDES TISSUE LEFT THUMB 09/04/2022 1351   SPECREQUEST REVISION AMPUTATION 09/04/2022 1351   CULT MODERATE STAPHYLOCOCCUS AUREUS 09/04/2022 1351   REPTSTATUS PENDING 09/04/2022 1351    Cardiac Enzymes: Recent Labs  Lab 08/31/22 0456  CKTOTAL 68   CBG: Recent Labs  Lab 09/05/22 1354 09/05/22 1608 09/05/22 1958 09/06/22 0732 09/06/22 1151  GLUCAP 146* 161* 293* 201* 182*    Medications:  cefTAZidime (FORTAZ)  IV 2 g (09/05/22 1605)   DAPTOmycin (CUBICIN) 650 mg in sodium chloride 0.9 % IVPB 650 mg (09/06/22 0039)    Chlorhexidine Gluconate Cloth  6 each Topical Q0600   Chlorhexidine Gluconate Cloth  6 each Topical Q0600   darbepoetin (ARANESP) injection - DIALYSIS  60 mcg Intravenous Q Thu-HD   furosemide  80 mg Oral BID   insulin aspart  0-24 Units Subcutaneous TID WC   insulin aspart  4 Units Subcutaneous TID WC   insulin detemir  30 Units Subcutaneous Daily   midodrine  10 mg Oral Q T,Th,Sa-HD   pantoprazole  40 mg Oral BID   rosuvastatin  10 mg Oral Daily  sevelamer carbonate  1,600 mg Oral TID WC    Dialysis Orders: TTS SW 4h 450/ 500  101 kg  3K/2.5 bath  Hep none LUA AVF - last HD post HD 101.6kg on 10/7 - last Hb 10.0 10/5 - mircera 50 q 2, last 9/26 - venofer 100 mg tiw IV thru 10/19 - doxercalciferol 5 ug ttw IV   Home meds include - eliquis, furosemide 40 qd, isordil 20, midodrine 10 prn, novolin insulin, rosuvastatin, vits/ supps / prns   Problem/Plan: ESRD- on HD TTS.  HD tomorrow per regular schedule. Volume overload- midodrine 10 mg TTS predialysis ordered recently. Tolerating UF 4L each HD here.  +hyponatremia, improved with HD.  Remains volume overloaded, continue max UF as tolerated. Will need dry weight lowered on d/c. Osteomyelitis left hand/thumb status post left arm AV fistula ligation/ left thumb flexor  sheath I&D , bone curettage and debridement of osteomyelitis of the distal phalanx on 10/11 and returned to OR on 10/16 for open debridement w/L thumb amputation thru proximal phalanx by Dr. Apolonio Schneiders (Hand ortho)- Had some bleeding yesterday - Hand surgeon notified.  ID antibiotic plan now daptomycin and Fortaz  6wk total follow-up in ID clinic on 10/04/2022 and per [facility] has adequate blood flow no angiogram needed. Dysphagia/distal esophageal narrowing (on barium swallow study )- EGD w/esophageal dilation yesterday.  Symptoms improved, now on regular diet - change to renal diet.   DM2- plan per admit team Anemia of ESRD -Hgb 9.5 today. Aranesp 60 q Thursday weekly/no iron with infection Secondary hyperparathyroidism - phos and Ca in goal. Renvela 800 increased 1600 mg.   Nutrition - Renal diet w/fluid restrictions once advanced.  Permanent A Fib - Eliquis on hold.   Jen Mow, PA-C [facility] 09/06/2022,12:33 PM  LOS: 7 days

## 2022-09-06 NOTE — Plan of Care (Signed)

## 2022-09-06 NOTE — Plan of Care (Signed)
  Problem: Education: Goal: Knowledge of General Education information will improve Description: Including pain rating scale, medication(s)/side effects and non-pharmacologic comfort measures Outcome: Progressing   Problem: Health Behavior/Discharge Planning: Goal: Ability to manage health-related needs will improve Outcome: Progressing   Problem: Activity: Goal: Risk for activity intolerance will decrease Outcome: Progressing   Problem: Nutrition: Goal: Adequate nutrition will be maintained Outcome: Progressing   Problem: Coping: Goal: Level of anxiety will decrease Outcome: Progressing   Problem: Skin Integrity: Goal: Risk for impaired skin integrity will decrease Outcome: Progressing   

## 2022-09-06 NOTE — Progress Notes (Signed)
Speech Language Pathology Treatment: Dysphagia  Patient Details Name: Louis [name] MRN: [id number] DOB: 20-Jan-1946 Today's Date: 09/06/2022 Time: 6269-4854 SLP Time Calculation (min) (ACUTE ONLY): 8 min  Assessment / Plan / Recommendation Clinical Impression  Pt seen for dysphagia with wife at bedside. Per documented results, yesterday's EGD showed narrowing at his GE junction that was dilated with pt report that he seems to be swallowing better this morning. Observed with softer sweet potato puffs then more regular solid texture with graham cracker both orally and pharyngeally managed without apparent difficulty. No s/s aspiration with solids or straw sips thin liquid. Therapist reviewed strategies to mitigate reflux/esophageal issues with explanation and voiced understanding. Therapist upgraded diet texture to regular. No further ST needed at this time.    HPI HPI: 76yo male admitted 08/28/22 with thumb drainage. PMH: ESRD on HD, DM2, CHF, CAD, HLD, HTN, arthritis, brittle bones, charcot ankle, kidney stones, macular degeneration      SLP Plan  All goals met;Discharge SLP treatment due to (comment)      Recommendations for follow up therapy are one component of a multi-disciplinary discharge planning process, led by the attending physician.  Recommendations may be updated based on patient status, additional functional criteria and insurance authorization.    Recommendations  Diet recommendations: Regular;Thin liquid Liquids provided via: Cup;Straw Medication Administration: Whole meds with liquid Supervision: Patient able to self feed Compensations: Slow rate;Small sips/bites Postural Changes and/or Swallow Maneuvers: Upright 30-60 min after meal;Seated upright 90 degrees                Oral Care Recommendations: Oral care BID Follow Up Recommendations: No SLP follow up Assistance recommended at discharge: None SLP Visit Diagnosis: Dysphagia, unspecified (R13.10) Plan: All  goals met;Discharge SLP treatment due to (comment)           Houston Siren  09/06/2022, 9:52 AM

## 2022-09-06 NOTE — Progress Notes (Signed)
Physical Therapy Treatment Patient Details Name: Louis [name] MRN: [id number] DOB: [birth year] Today's Date: 09/06/2022   History of Present Illness Patient is a 76 y/o male who presents on 10/9 with drainage, pain and swelling of left hand/thumb. 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 PMH includes CAD,. CHF, DM, ESRD on HD, HTN, paroxysmal A-fib, macular degeneration.    PT Comments    Patient seen after L thumb amputation with assumption of NWB to L hand. Patient requires modA for bed mobility and modA+2 for sit to stand and step pivot transfer. Daughter informed therapists of patient's diabetic shoes and brace for Charcot foot which will be utilized next session. Continue to recommend SNF for ongoing Physical Therapy.       Recommendations for follow up therapy are one component of a multi-disciplinary discharge planning process, led by the attending physician.  Recommendations may be updated based on patient status, additional functional criteria and insurance authorization.  Follow Up Recommendations  Skilled nursing-short term rehab (<3 hours/day) (but hopeful to work towards home) Can patient physically be transported by private vehicle: No   Assistance Recommended at Discharge Frequent or constant Supervision/Assistance  Patient can return home with the following Two people to help with walking and/or transfers;A lot of help with bathing/dressing/bathroom;Assistance with cooking/housework;Assist for transportation;Assistance with feeding   Equipment Recommendations  Other (comment) (TBD)    Recommendations for Other Services       Precautions / Restrictions Precautions Precautions: Fall Restrictions Weight Bearing Restrictions: No Other Position/Activity Restrictions: assume NWB on L hand     Mobility  Bed Mobility Overal bed mobility:  Needs Assistance Bed Mobility: Supine to Sit     Supine to sit: Mod assist, HOB elevated     General bed mobility comments: increased time to complete. Cues for reaching with R hand to bed rail to assist. Assist for trunk elevation and repositioning hips towards EOB    Transfers Overall transfer level: Needs assistance Equipment used: 1 person hand held assist Transfers: Sit to/from Stand, Bed to chair/wheelchair/BSC Sit to Stand: Mod assist, +2 physical assistance   Step pivot transfers: Mod assist, +2 physical assistance       General transfer comment: modA+2 to stand from bed and recliner x 3. Posterior bias during steps towards recliner and sitting prematurely despite cues    Ambulation/Gait                   Stairs             Wheelchair Mobility    Modified Rankin (Stroke Patients Only)       Balance Overall balance assessment: Needs assistance Sitting-balance support: Feet supported, No upper extremity supported Sitting balance-Leahy Scale: Fair     Standing balance support: Single extremity supported, During functional activity Standing balance-Leahy Scale: Poor Standing balance comment: modA+2 to maintain standing balance                            Cognition Arousal/Alertness: Awake/alert Behavior During Therapy: WFL for tasks assessed/performed Overall Cognitive Status: Impaired/Different from baseline Area of Impairment: Problem solving                             Problem Solving: Slow processing, Decreased initiation, Difficulty sequencing, Requires verbal  cues, Requires tactile cues General Comments: flat affect, seems to require increased time to process tasks        Exercises      General Comments        Pertinent Vitals/Pain Pain Assessment Pain Assessment: 0-10 Pain Score: 3  Pain Location: L hand Pain Descriptors / Indicators: Sore, Discomfort, Operative site guarding Pain Intervention(s):  Monitored during session, Premedicated before session    Home Living                          Prior Function            PT Goals (current goals can now be found in the care plan section) Acute Rehab PT Goals PT Goal Formulation: With patient/family Time For Goal Achievement: 09/15/22 Potential to Achieve Goals: Good Progress towards PT goals: Progressing toward goals    Frequency    Min 3X/week      PT Plan Current plan remains appropriate    Co-evaluation              AM-PAC PT "6 Clicks" Mobility   Outcome Measure  Help needed turning from your back to your side while in a flat bed without using bedrails?: A Lot Help needed moving from lying on your back to sitting on the side of a flat bed without using bedrails?: A Lot Help needed moving to and from a bed to a chair (including a wheelchair)?: Total Help needed standing up from a chair using your arms (e.g., wheelchair or bedside chair)?: Total Help needed to walk in hospital room?: Total Help needed climbing 3-5 steps with a railing? : Total 6 Click Score: 8    End of Session Equipment Utilized During Treatment: Gait belt Activity Tolerance: Patient tolerated treatment well Patient left: in chair;with call bell/phone within reach;with chair alarm set;with family/visitor present Nurse Communication: Mobility status PT Visit Diagnosis: Pain;Muscle weakness (generalized) (M62.81);Difficulty in walking, not elsewhere classified (R26.2);Unsteadiness on feet (R26.81)     Time: 1761-6073 PT Time Calculation (min) (ACUTE ONLY): 23 min  Charges:  $Therapeutic Activity: 8-22 mins                     Mackey Varricchio A. Gilford Rile PT, DPT Acute Rehabilitation Services Office [phone]    Linna Hoff 09/06/2022, 12:43 PM

## 2022-09-06 NOTE — Progress Notes (Signed)
Patient had multiple dressing changes again. Each time saturated with blood. Doctor made aware. Was told to keep changing dressing as usual.

## 2022-09-06 NOTE — Anesthesia Postprocedure Evaluation (Signed)
Anesthesia Post Note  Patient: Louis [name]  Procedure(s) Performed: ESOPHAGOGASTRODUODENOSCOPY (EGD) WITH PROPOFOL BIOPSY SAVORY DILATION     Patient location during evaluation: PACU Anesthesia Type: MAC Level of consciousness: awake and alert Pain management: pain level controlled Vital Signs Assessment: post-procedure vital signs reviewed and stable Respiratory status: spontaneous breathing, nonlabored ventilation, respiratory function stable and patient connected to nasal cannula oxygen Cardiovascular status: stable and blood pressure returned to baseline Postop Assessment: no apparent nausea or vomiting Anesthetic complications: no   No notable events documented.  Last Vitals:  Vitals:   09/06/22 0335 09/06/22 0738  BP: 120/73   Pulse: 62   Resp:    Temp: 36.6 C 36.7 C  SpO2: 100%     Last Pain:  Vitals:   09/06/22 0738  TempSrc: Oral  PainSc:                  Jaliah Foody S

## 2022-09-07 ENCOUNTER — Encounter (HOSPITAL_COMMUNITY): Payer: Self-pay | Admitting: Family Medicine

## 2022-09-07 DIAGNOSIS — M86042 Acute hematogenous osteomyelitis, left hand: Secondary | ICD-10-CM | POA: Diagnosis not present

## 2022-09-07 LAB — CBC WITH DIFFERENTIAL/PLATELET
Abs Immature Granulocytes: 0.41 10*3/uL — ABNORMAL HIGH (ref 0.00–0.07)
Basophils Absolute: 0 10*3/uL (ref 0.0–0.1)
Basophils Relative: 0 %
Eosinophils Absolute: 0.1 10*3/uL (ref 0.0–0.5)
Eosinophils Relative: 1 %
HCT: 25.6 % — ABNORMAL LOW (ref 39.0–52.0)
Hemoglobin: 8.3 g/dL — ABNORMAL LOW (ref 13.0–17.0)
Immature Granulocytes: 3 %
Lymphocytes Relative: 7 %
Lymphs Abs: 1.1 10*3/uL (ref 0.7–4.0)
MCH: 30.7 pg (ref 26.0–34.0)
MCHC: 32.4 g/dL (ref 30.0–36.0)
MCV: 94.8 fL (ref 80.0–100.0)
Monocytes Absolute: 1.3 10*3/uL — ABNORMAL HIGH (ref 0.1–1.0)
Monocytes Relative: 8 %
Neutro Abs: 12.4 10*3/uL — ABNORMAL HIGH (ref 1.7–7.7)
Neutrophils Relative %: 81 %
Platelets: 204 10*3/uL (ref 150–400)
RBC: 2.7 MIL/uL — ABNORMAL LOW (ref 4.22–5.81)
RDW: 19.3 % — ABNORMAL HIGH (ref 11.5–15.5)
WBC: 15.2 10*3/uL — ABNORMAL HIGH (ref 4.0–10.5)
nRBC: 0 % (ref 0.0–0.2)

## 2022-09-07 LAB — SURGICAL PATHOLOGY

## 2022-09-07 LAB — GLUCOSE, CAPILLARY
Glucose-Capillary: 284 mg/dL — ABNORMAL HIGH (ref 70–99)
Glucose-Capillary: 184 mg/dL — ABNORMAL HIGH (ref 70–99)
Glucose-Capillary: 220 mg/dL — ABNORMAL HIGH (ref 70–99)

## 2022-09-07 LAB — CK: Total CK: 58 U/L (ref 49–397)

## 2022-09-07 MED ORDER — ALBUMIN HUMAN 25 % IV SOLN
INTRAVENOUS | Status: AC
  Administered 2022-09-07: 25 g via INTRAVENOUS
  Filled 2022-09-07: qty 100

## 2022-09-07 MED ORDER — ALBUMIN HUMAN 25 % IV SOLN
INTRAVENOUS | Status: AC
  Filled 2022-09-07: qty 50

## 2022-09-07 MED ORDER — ALBUMIN HUMAN 25 % IV SOLN
12.5000 g | Freq: Once | INTRAVENOUS | Status: AC
  Administered 2022-09-07: 12.5 g via INTRAVENOUS

## 2022-09-07 MED ORDER — ALTEPLASE 2 MG IJ SOLR: 2.0000 mg | Freq: Once | INTRAMUSCULAR | Status: DC | PRN

## 2022-09-07 MED ORDER — HEPARIN SODIUM (PORCINE) 1000 UNIT/ML DIALYSIS: 1000.0000 [IU] | INTRAMUSCULAR | Status: DC | PRN

## 2022-09-07 MED ORDER — ANTICOAGULANT SODIUM CITRATE 4% (200MG/5ML) IV SOLN: 5.0000 mL | Status: DC | PRN

## 2022-09-07 MED ORDER — ALBUMIN HUMAN 25 % IV SOLN: 25.0000 g | Freq: Once | INTRAVENOUS | Status: AC

## 2022-09-07 MED ORDER — DARBEPOETIN ALFA 100 MCG/0.5ML IJ SOSY: 90.0000 ug | PREFILLED_SYRINGE | Freq: Once | INTRAMUSCULAR | Status: DC

## 2022-09-07 MED ORDER — DARBEPOETIN ALFA 150 MCG/0.3ML IJ SOSY: 150.0000 ug | PREFILLED_SYRINGE | INTRAMUSCULAR | Status: DC

## 2022-09-07 MED ORDER — LIDOCAINE HCL (PF) 1 % IJ SOLN: 5.0000 mL | INTRAMUSCULAR | Status: DC | PRN

## 2022-09-07 MED ORDER — HEPARIN SODIUM (PORCINE) 1000 UNIT/ML IJ SOLN
INTRAMUSCULAR | Status: AC
  Filled 2022-09-07: qty 4

## 2022-09-07 NOTE — Inpatient Diabetes Management (Addendum)
Inpatient Diabetes Program Recommendations  AACE/ADA: New Consensus Statement on Inpatient Glycemic Control (2015)  Target Ranges:  Prepandial:   less than 140 mg/dL      Peak postprandial:   less than 180 mg/dL (1-2 hours)      Critically ill patients:  140 - 180 mg/dL   Lab Results  Component Value Date   GLUCAP 284 (H) 09/07/2022   HGBA1C 6.2 03/12/2022    Review of Glycemic Control  Latest Reference Range & Units 09/06/22 07:32 09/06/22 11:51 09/06/22 17:58 09/06/22 20:47 09/07/22 07:41  Glucose-Capillary 70 - 99 mg/dL 201 (H) 182 (H) 304 (H) 257 (H) 284 (H)  (H): Data is abnormally high  Diabetes history: DM2 Outpatient Diabetes medications: NPH 10 units BID & Regular 0-15 units TID Current orders for Inpatient glycemic control:  Novolog 0-24 units TID and 4 units TID with meals, Levemir 30 units QAM  Inpatient Diabetes Program Recommendations:    Novolog 0-20 units TID Novolog 6 units TID with meals if consumes at least 50% Novolog 0-5 units QHS  Will continue to follow while inpatient.  Thank you, Reche Dixon, MSN, [place] [facility] Coordinator Inpatient Diabetes Program [phone] (team pager from 8a-5p)

## 2022-09-07 NOTE — Progress Notes (Signed)
[facility] Progress Note   Subjective:   Patient seen in HD  Had some bleeding from his hand overnight -  no c/os as usual-  big goal and BP soft Objective Vitals:   09/07/22 0851 09/07/22 0920 09/07/22 0930 09/07/22 0955  BP: 102/60 91/64 (!) 95/56 (!) 80/58  Pulse: 64 84 85   Resp: '14 15 14   '$ Temp: (!) 97.5 F (36.4 C)     TempSrc: Oral     SpO2: 100%     Weight:      Height:       Physical Exam General:chronically ill appearing male in NAD Heart:RRR, no mrg Lungs:CTAB, nml WOB on RA Abdomen:soft, NTND Extremities:1-2+ LE edema R>L, R hand/forearm dressed, loose ACE wrap covering with some blood present on underside Dialysis Access: [facility]   Filed Weights   09/05/22 1250 09/05/22 1305 09/07/22 0809  Weight: 100.4 kg 100.4 kg 104.4 kg    Intake/Output Summary (Last 24 hours) at 09/07/2022 0958 Last data filed at 09/06/2022 1300 Gross per 24 hour  Intake 360 ml  Output --  Net 360 ml    Additional Objective Labs: Basic Metabolic Panel: Recent Labs  Lab 09/02/22 0702 09/02/22 0834 09/04/22 1024 09/05/22 0814  NA 127* 127* 129* 129*  K 4.6 4.3 4.2 4.8  CL 89* 88* 91* 88*  CO2 '23 25 24 27  '$ GLUCOSE 117* 112* 158* 256*  BUN 76* 75* 72* 82*  CREATININE 5.92* 5.95* 6.18* 6.76*  CALCIUM 8.6* 8.6* 8.4* 9.2  PHOS 5.9* 5.8*  --  5.4*   Liver Function Tests: Recent Labs  Lab 09/02/22 0702 09/02/22 0834 09/05/22 0814  ALBUMIN 2.0* 1.9* 2.0*   CBC: Recent Labs  Lab 09/01/22 1120 09/04/22 1024 09/05/22 0814 09/06/22 0312 09/07/22 0829  WBC 13.8* 14.3* 15.6* 16.9* 15.2*  NEUTROABS 12.3*  --   --  14.2* 12.4*  HGB 10.6* 10.3* 9.8* 9.5* 8.3*  HCT 33.4* 33.6* 30.8* 30.8* 25.6*  MCV 95.4 93.9 93.6 94.2 94.8  PLT 270 210 212 222 204   Blood Culture    Component Value Date/Time   SDES TISSUE LEFT THUMB 09/04/2022 1351   SPECREQUEST REVISION AMPUTATION 09/04/2022 1351   CULT  09/04/2022 1351    MODERATE METHICILLIN RESISTANT STAPHYLOCOCCUS  AUREUS NO ANAEROBES ISOLATED; CULTURE IN PROGRESS FOR 5 DAYS    REPTSTATUS PENDING 09/04/2022 1351    Cardiac Enzymes: Recent Labs  Lab 09/07/22 0437  CKTOTAL 58   CBG: Recent Labs  Lab 09/06/22 0732 09/06/22 1151 09/06/22 1758 09/06/22 2047 09/07/22 0741  GLUCAP 201* 182* 304* 257* 284*    Medications:  anticoagulant sodium citrate     cefTAZidime (FORTAZ)  IV 2 g (09/05/22 1605)   DAPTOmycin (CUBICIN) 650 mg in sodium chloride 0.9 % IVPB     [START ON 09/09/2022] DAPTOmycin (CUBICIN) 950 mg in sodium chloride 0.9 % IVPB      Chlorhexidine Gluconate Cloth  6 each Topical Q0600   Chlorhexidine Gluconate Cloth  6 each Topical Q0600   Chlorhexidine Gluconate Cloth  6 each Topical Q0600   darbepoetin (ARANESP) injection - DIALYSIS  60 mcg Intravenous Q Thu-HD   furosemide  80 mg Oral BID   heparin sodium (porcine)       insulin aspart  0-24 Units Subcutaneous TID WC   insulin aspart  4 Units Subcutaneous TID WC   insulin detemir  30 Units Subcutaneous Daily   midodrine  10 mg Oral Q T,Th,Sa-HD   pantoprazole  40 mg Oral BID   rosuvastatin  10 mg Oral Daily   sevelamer carbonate  1,600 mg Oral TID WC    Dialysis Orders: TTS SW 4h 450/ 500  101 kg  3K/2.5 bath  Hep none LUA AVF - last HD post HD 101.6kg on 10/7 - last Hb 10.0 10/5 - mircera 50 q 2, last 9/26 - venofer 100 mg tiw IV thru 10/19 - doxercalciferol 5 ug ttw IV   Home meds include - eliquis, furosemide 40 qd, isordil 20, midodrine 10 prn, novolin insulin, rosuvastatin, vits/ supps / prns   Problem/Plan: ESRD- on HD TTS.  HD today per regular schedule. Volume overload- midodrine 10 mg TTS predialysis ordered recently. Tolerating UF 4L each HD here.  +hyponatremia, improved with HD.  Remains volume overloaded, continue max UF as tolerated. Will need dry weight lowered on d/c. Osteomyelitis left hand/thumb status post left arm AV fistula ligation/ left thumb flexor sheath I&D , bone curettage and  debridement of osteomyelitis of the distal phalanx on 10/11 and returned to OR on 10/16 for open debridement w/L thumb amputation thru proximal phalanx by Dr. Apolonio Schneiders (Hand ortho)- Had some bleeding yesterday - Hand surgeon notified.  ID antibiotic plan now daptomycin and Fortaz  6wk total follow-up in ID clinic on 10/04/2022 and per [facility] has adequate blood flow no angiogram needed. Dysphagia/distal esophageal narrowing (on barium swallow study )- EGD w/esophageal dilation yesterday.  Symptoms improved, now on regular diet - change to renal diet.   DM2- plan per admit team Anemia of ESRD -Hgb 9.5 today. Aranesp 60 q Thursday weekly/no iron with infection-  now dropping-  will inc ESA Secondary hyperparathyroidism - phos and Ca in goal. Renvela 800 increased 1600 mg.   Nutrition - Renal diet w/fluid restrictions once advanced.  Permanent A Fib - Eliquis on hold.   [facility] 09/07/2022,9:58 AM  LOS: 8 days

## 2022-09-07 NOTE — Progress Notes (Signed)
PT RECEIVED TO UNIT IN BED STABLE FOR HD NO C/OS AT THIS TIME

## 2022-09-07 NOTE — Plan of Care (Signed)
  Problem: Pain Managment: Goal: General experience of comfort will improve Outcome: Progressing   Problem: Safety: Goal: Ability to remain free from injury will improve Outcome: Progressing   Problem: Skin Integrity: Goal: Risk for impaired skin integrity will decrease Outcome: Progressing   

## 2022-09-07 NOTE — Progress Notes (Signed)
   09/07/22 1300  Vitals  Temp (!) 97.4 F (36.3 C)  Pulse Rate 79  Resp 18  BP (!) 96/58  SpO2 99 %  O2 Device Room Air  Oxygen Therapy  Patient Activity (if Appropriate) In bed  Post Treatment  Dialyzer Clearance Clear  Duration of HD Treatment -hour(s) 4 hour(s)  Hemodialysis Intake (mL) 0 mL  Fluid Removed 3900 mL  Tolerated HD Treatment Yes   Received patient in bed to unit.  Alert and oriented.  Informed consent signed and in chart.   Treatment initiated: 1840 Treatment completed: 1255  Patient tolerated well.  Transported back to the room  Alert, without acute distress.  Hand-off given to patient's nurse.   Access used: [facility] Access issues: none   Medication(s) given: Aranesp 60 mcg, Albumin 25g & 12.5G    Na'Shaminy T [facility]

## 2022-09-07 NOTE — Procedures (Signed)
Patient was seen on dialysis and the procedure was supervised.  BFR 350  Via TDC BP is  90/50.   Patient appears to be tolerating treatment well-  has a lot of volume on board-  uf as able   Louis Meckel 09/07/2022

## 2022-09-07 NOTE — Progress Notes (Addendum)
Progress Note    Louis [name]  BPZ:[id number] DOB: 1946/02/14  DOA: 08/28/2022 PCP: [name], MD      Brief Narrative:    Medical records reviewed and are as summarized below:  Louis [name] is a 76 y.o. male  with history of ESRD on hemodialysis, type 2 diabetes, CHF, CAD, hyperlipidemia, hypertension presented to the ER at the recommendation of his family medicine physician.  Patient had left hand swelling  for the last 2 days.  He has noticed bloody drainage so he presented to the ER.  He has had wounds on his hands for a year however never to this severity.  In the ER he had x-rays obtained which demonstrated extensive soft tissue swelling of the left thumb concern for osteomyelitis. Hand surgery was consulted in the ED the patient was admitted for IV antibiotics.        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 (Obesity)  Acute osteomyelitis of left thumb and right ring finger: Orthopedics as well as ID on board.  Patient underwent left upper arm AV fistula ligation and placement of left IJ TDC by vascular surgery on 08/29/2022.  He then underwent Left thumb flexor sheath incision and drainage by Dr. Caralyn Guile on 08/30/2022. Intraoperative cultures are growing MRSA.  ID on board but signed off,  currently patient  daptomycin and Fortaz per ID and they recommend continuing these for 6 weeks with dialysis and follow-up with ID in the clinic on 10/04/2022.   Vascular surgery also saw him and per them, he has good blood flow so no angiogram is needed.  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 Caralyn Guile on 09/04/2022.   Reportedly, he had profuse bleeding from the left hand surgical site on 09/06/2022.  Awaiting reevaluation by Dr. Caralyn Guile.  I could not reach Dr. Caralyn Guile via secure chat so I called his office.  I was told he was not available.  I spoke to [facility], his physician assistant, who said she will notify Dr. Caralyn Guile to get in touch with patient and/or his wife.   ESRD on HD: He had hemodialysis today.  Follow-up with nephrologist   Hyperlipidemia: Continue Crestor.   Type 2 diabetes mellitus with hyperglycemia: Continue Semglee and NovoLog as needed   Likely chronic hyponatremia: Asymptomatic   Permanent atrial fibrillation: Rates controlled.  He is not on any rate control medications. Orthopedics cleared him to resume Eliquis on 09/01/2022.  However, this was held for EGD on 09/03/2022.  Given recent bleeding from the left hand surgical wound, Eliquis has been held again.  Acute blood loss anemia: Hemoglobin down from 10.6-8.3.  Monitor H&H and transfuse as needed.  Dysphagia: Patient was noted to have some aspiration event on 08/31/2022 with the nurses.  Evaluated by SLP, they suspected esophageal dysphagia and recommended esophagogram which is completed and shows distal esophageal stenosis.  Seen by GI, underwent EGD and esophageal dilatation on 09/05/2022.   He was also found to have nonbleeding gastric ulcer with clean base which was biopsied and erythematous mucosa in gastric body.  It was biopsied as well.  He is  tolerating renal diabetic diet     Diet Order             Diet renal with fluid restriction Fluid restriction: 1200 mL Fluid; Room service appropriate? Yes; Fluid consistency: Thin  Diet effective now                            Consultants: Copy Nephrologist ID specialist Vascular surgeon  Procedures: EGD on 09/05/2022 Left thumb amputation on 09/04/2022    Medications:    Chlorhexidine Gluconate Cloth  6 each Topical Q0600   Chlorhexidine Gluconate Cloth  6 each Topical Q0600   Chlorhexidine Gluconate Cloth  6 each Topical Q0600   darbepoetin (ARANESP) injection - DIALYSIS  60 mcg Intravenous Q Thu-HD   furosemide  80 mg Oral BID   heparin sodium (porcine)       insulin aspart  0-24 Units Subcutaneous TID WC   insulin aspart  4 Units Subcutaneous TID WC   insulin detemir  30 Units Subcutaneous Daily   midodrine  10 mg Oral Q T,Th,Sa-HD   pantoprazole  40 mg Oral BID   rosuvastatin  10 mg Oral Daily   sevelamer carbonate  1,600 mg Oral TID WC   Continuous Infusions:  anticoagulant sodium citrate     cefTAZidime (FORTAZ)  IV 2 g (09/05/22 1605)   DAPTOmycin (CUBICIN) 650 mg in sodium chloride 0.9 % IVPB     [START ON 09/09/2022] DAPTOmycin (CUBICIN) 950 mg in sodium chloride 0.9 % IVPB       Anti-infectives (From admission, onward)    Start     Dose/Rate Route Frequency Ordered Stop   09/09/22 2000  DAPTOmycin (CUBICIN) 950 mg in sodium chloride 0.9 % IVPB        12 mg/kg  79.8 kg (Adjusted) 138 mL/hr over 30 Minutes Intravenous Every Sat (1800) 09/06/22 1530     09/07/22 2000  DAPTOmycin (CUBICIN) 650 mg in sodium chloride 0.9 % IVPB        8 mg/kg  79.8 kg (Adjusted) 126 mL/hr over 30 Minutes Intravenous Once per day on Tue Thu 09/06/22 1530     09/02/22 1200  cefTAZidime (FORTAZ) 2 g in sodium chloride 0.9 % 100 mL IVPB        2 g 200 mL/hr over 30 Minutes Intravenous Every T-Th-Sa (Hemodialysis) 09/01/22 1654     09/01/22 2000   DAPTOmycin (CUBICIN) 650 mg in sodium chloride 0.9 % IVPB  Status:  Discontinued        8 mg/kg  81.9 kg (Adjusted) 126 mL/hr over 30 Minutes Intravenous Every 48 hours 08/31/22 0738 09/06/22 1530   09/01/22 1830  cefTAZidime (FORTAZ) 1 g in sodium chloride 0.9 % 100 mL IVPB        1 g 200 mL/hr over 30 Minutes Intravenous  Once 09/01/22 1654 09/01/22 2212   08/31/22 2200  DAPTOmycin (CUBICIN) 650 mg in sodium chloride 0.9 % IVPB  Status:  Discontinued  6 mg/kg  105.7 kg 126 mL/hr over 30 Minutes Intravenous Every 48 hours 08/30/22 1822 08/31/22 0738   08/30/22 1915  DAPTOmycin (CUBICIN) 650 mg in sodium chloride 0.9 % IVPB        6 mg/kg  105.7 kg 126 mL/hr over 30 Minutes Intravenous  Once 08/30/22 1822 08/30/22 2126   08/30/22 1800  cefTRIAXone (ROCEPHIN) 2 g in sodium chloride 0.9 % 100 mL IVPB  Status:  Discontinued        2 g 200 mL/hr over 30 Minutes Intravenous Every 24 hours 08/30/22 1018 09/01/22 0958   08/29/22 1500  ceFAZolin (ANCEF) IVPB 2g/100 mL premix  Status:  Discontinued        2 g 200 mL/hr over 30 Minutes Intravenous On call to O.R. 08/29/22 1454 08/29/22 1705   08/29/22 1430  ceFAZolin (ANCEF) IVPB 2g/100 mL premix        2 g 200 mL/hr over 30 Minutes Intravenous  Once 08/29/22 1418 08/29/22 1456   08/29/22 1417  ceFAZolin (ANCEF) 2-4 GM/100ML-% IVPB       Note to Pharmacy: Cameron Sprang M: cabinet override      08/29/22 1417 08/29/22 1524   08/29/22 1400  piperacillin-tazobactam (ZOSYN) IVPB 2.25 g  Status:  Discontinued        2.25 g 100 mL/hr over 30 Minutes Intravenous Every 8 hours 08/29/22 0501 08/30/22 1018   08/29/22 0500  linezolid (ZYVOX) IVPB 600 mg  Status:  Discontinued        600 mg 300 mL/hr over 60 Minutes Intravenous Every 12 hours 08/29/22 0458 08/30/22 1738   08/29/22 0500  piperacillin-tazobactam (ZOSYN) IVPB 3.375 g        3.375 g 100 mL/hr over 30 Minutes Intravenous  Once 08/29/22 0459 08/29/22 0720               Family Communication/Anticipated D/C date and plan/Code Status   DVT prophylaxis: SCD's Start: 09/04/22 2028 SCD's Start: 08/30/22 1455 SCDs Start: 08/29/22 0549     Code Status: Full Code  Family Communication: None Disposition Plan: Discharge to SNF in 1 to 2 days   Status is: Inpatient Remains inpatient appropriate because: Monitor H&H, awaiting placement to SNF       Subjective:   Interval events noted.  Bleeding from the left hand surgical wound appears to have resolved.  No shortness of breath or chest pain.  He was seen on the hemodialysis unit.  Objective:    Vitals:   09/07/22 0809 09/07/22 0820 09/07/22 0850 09/07/22 0851  BP: 102/60 (!) 100/57 105/62 102/60  Pulse: 81 62 75 64  Resp: '13 15 16 14  '$ Temp: (!) 97.5 F (36.4 C)   (!) 97.5 F (36.4 C)  TempSrc: Oral   Oral  SpO2: 96% 100% 99% 100%  Weight: 104.4 kg     Height:       No data found.   Intake/Output Summary (Last 24 hours) at 09/07/2022 0921 Last data filed at 09/06/2022 1300 Gross per 24 hour  Intake 360 ml  Output --  Net 360 ml   Filed Weights   09/05/22 1250 09/05/22 1305 09/07/22 0809  Weight: 100.4 kg 100.4 kg 104.4 kg    Exam:  GEN: NAD SKIN: Warm and dry EYES: No pallor or icterus ENT: MMM CV: RRR PULM: CTA B ABD: soft, obese, NT, +BS CNS: AAO x 3, non focal EXT: Dressing on left hand surgical wound is stained with blood but no active bleeding noted.  No edema or tenderness        Data Reviewed:   I have personally reviewed following labs and imaging studies:  Labs: Labs show the following:   Basic Metabolic Panel: Recent Labs  Lab 09/02/22 0702 09/02/22 0834 09/04/22 1024 09/05/22 0814  NA 127* 127* 129* 129*  K 4.6 4.3 4.2 4.8  CL 89* 88* 91* 88*  CO2 '23 25 24 27  '$ GLUCOSE 117* 112* 158* 256*  BUN 76* 75* 72* 82*  CREATININE 5.92* 5.95* 6.18* 6.76*  CALCIUM 8.6* 8.6* 8.4* 9.2  PHOS 5.9* 5.8*  --  5.4*   GFR Estimated  Creatinine Clearance: 10.7 mL/min (A) (by C-G formula based on SCr of 6.76 mg/dL (H)). Liver Function Tests: Recent Labs  Lab 09/02/22 0702 09/02/22 0834 09/05/22 0814  ALBUMIN 2.0* 1.9* 2.0*   No results for input(s): "LIPASE", "AMYLASE" in the last 168 hours. No results for input(s): "AMMONIA" in the last 168 hours. Coagulation profile No results for input(s): "INR", "PROTIME" in the last 168 hours.  CBC: Recent Labs  Lab 09/01/22 1120 09/04/22 1024 09/05/22 0814 09/06/22 0312 09/07/22 0829  WBC 13.8* 14.3* 15.6* 16.9* 15.2*  NEUTROABS 12.3*  --   --  14.2* 12.4*  HGB 10.6* 10.3* 9.8* 9.5* 8.3*  HCT 33.4* 33.6* 30.8* 30.8* 25.6*  MCV 95.4 93.9 93.6 94.2 94.8  PLT 270 210 212 222 204   Cardiac Enzymes: Recent Labs  Lab 09/07/22 0437  CKTOTAL 58   BNP (last 3 results) No results for input(s): "PROBNP" in the last 8760 hours. CBG: Recent Labs  Lab 09/06/22 0732 09/06/22 1151 09/06/22 1758 09/06/22 2047 09/07/22 0741  GLUCAP 201* 182* 304* 257* 284*   D-Dimer: No results for input(s): "DDIMER" in the last 72 hours. Hgb A1c: No results for input(s): "HGBA1C" in the last 72 hours. Lipid Profile: No results for input(s): "CHOL", "HDL", "LDLCALC", "TRIG", "CHOLHDL", "LDLDIRECT" in the last 72 hours. Thyroid function studies: No results for input(s): "TSH", "T4TOTAL", "T3FREE", "THYROIDAB" in the last 72 hours.  Invalid input(s): "FREET3" Anemia work up: No results for input(s): "VITAMINB12", "FOLATE", "FERRITIN", "TIBC", "IRON", "RETICCTPCT" in the last 72 hours. Sepsis Labs: Recent Labs  Lab 09/01/22 1120 09/04/22 1024 09/05/22 0814 09/06/22 0312 09/07/22 0829  PROCALCITON 1.81  --   --   --   --   WBC 13.8* 14.3* 15.6* 16.9* 15.2*    Microbiology Recent Results (from the past 240 hour(s))  WOUND CULTURE     Status: Abnormal   Collection Time: 08/28/22  4:29 PM   Specimen: Thumb; Wound  Result Value Ref Range Status   MICRO NUMBER: [id number]   Final   SPECIMEN QUALITY: Adequate  Final   SOURCE: WOUND (SITE NOT SPECIFIED)  Final   STATUS: FINAL  Final   GRAM STAIN:   Final    No white blood cells seen No epithelial cells seen Many Gram positive cocci in clusters   ISOLATE 1: methicillin resistant Staphylococcus aureus (A)  Final    Comment: Heavy growth of Methicillin resistant Staphylococcus aureus (MRSA) Negative for inducible clindamycin resistance.      Susceptibility   Methicillin resistant staphylococcus aureus - AEROBIC CULT, GRAM STAIN POSITIVE 1    VANCOMYCIN 1 Sensitive     CIPROFLOXACIN >=8 Resistant     CLINDAMYCIN <=0.25 Sensitive     LEVOFLOXACIN 4 Intermediate     ERYTHROMYCIN >=8 Resistant     GENTAMICIN <=0.5 Sensitive     OXACILLIN* NR Resistant      *  Oxacillin-resistant staphylococci are resistant to all currently available beta-lactam antimicrobial agents with the possible exception of ceftaroline.     TETRACYCLINE <=1 Sensitive     TRIMETH/SULFA* <=10 Sensitive      * Oxacillin-resistant staphylococci are resistant to all currently available beta-lactam antimicrobial agents with the possible exception of ceftaroline. Legend: S = Susceptible  I = Intermediate R = Resistant  NS = Not susceptible * = Not tested  NR = Not reported **NN = See antimicrobic comments   Blood culture (routine x 2)     Status: None   Collection Time: 08/28/22 11:30 PM   Specimen: BLOOD RIGHT HAND  Result Value Ref Range Status   Specimen Description BLOOD RIGHT HAND  Final   Special Requests   Final    BOTTLES DRAWN AEROBIC AND ANAEROBIC Blood Culture adequate volume   Culture   Final    NO GROWTH 5 DAYS Performed at [facility], [place] [address], [place], [place] [address]    Report Status 09/02/2022 FINAL  Final  Blood culture (routine x 2)     Status: None   Collection Time: 08/29/22  5:40 AM   Specimen: BLOOD RIGHT FOREARM  Result Value Ref Range Status   Specimen Description BLOOD RIGHT FOREARM  Final    Special Requests   Final    BOTTLES DRAWN AEROBIC AND ANAEROBIC Blood Culture results may not be optimal due to an inadequate volume of blood received in culture bottles   Culture   Final    NO GROWTH 5 DAYS Performed at [facility], [place] [address] [place], [place] [address]    Report Status 09/03/2022 FINAL  Final  MRSA Next Gen by PCR, Nasal     Status: Abnormal   Collection Time: 08/30/22  3:41 AM   Specimen: Nasal Mucosa; Nasal Swab  Result Value Ref Range Status   MRSA by PCR Next Gen DETECTED (A) NOT DETECTED Final    Comment: CRITICAL RESULT CALLED TO, READ BACK BY AND VERIFIED WITH:  C/ ERIC N., RN 08/30/22 0548 A. LAFRANCE (NOTE) The GeneXpert MRSA Assay (FDA approved for NASAL specimens only), is one component of a comprehensive MRSA colonization surveillance program. It is not intended to diagnose MRSA infection nor to guide or monitor treatment for MRSA infections. Test performance is not FDA approved in patients less than 11 years old. Performed at [facility], [place] [address], [place], [place] [address]   Aerobic/Anaerobic Culture w Gram Stain (surgical/deep wound)     Status: None   Collection Time: 08/30/22  1:43 PM   Specimen: PATH Other; Body Fluid  Result Value Ref Range Status   Specimen Description WOUND  Final   Special Requests LEFT THUMB ABSCESS SPEC A  Final   Gram Stain   Final    RARE WBC PRESENT, PREDOMINANTLY MONONUCLEAR FEW GRAM POSITIVE COCCI IN PAIRS IN CLUSTERS RARE GRAM NEGATIVE RODS    Culture   Final    MODERATE METHICILLIN RESISTANT STAPHYLOCOCCUS AUREUS MODERATE HAEMOPHILUS PARAINFLUENZAE BETA LACTAMASE NEGATIVE NO ANAEROBES ISOLATED Performed at [facility], [place] [address], [place], [place] [address]    Report Status 09/04/2022 FINAL  Final   Organism ID, Bacteria METHICILLIN RESISTANT STAPHYLOCOCCUS AUREUS  Final      Susceptibility   Methicillin resistant staphylococcus aureus - MIC*    CIPROFLOXACIN 4  RESISTANT Resistant     ERYTHROMYCIN >=8 RESISTANT Resistant     GENTAMICIN <=0.5 SENSITIVE Sensitive  OXACILLIN >=4 RESISTANT Resistant     TETRACYCLINE <=1 SENSITIVE Sensitive     VANCOMYCIN 1 SENSITIVE Sensitive     TRIMETH/SULFA <=10 SENSITIVE Sensitive     CLINDAMYCIN <=0.25 SENSITIVE Sensitive     RIFAMPIN <=0.5 SENSITIVE Sensitive     Inducible Clindamycin NEGATIVE Sensitive     * MODERATE METHICILLIN RESISTANT STAPHYLOCOCCUS AUREUS  Aerobic/Anaerobic Culture w Gram Stain (surgical/deep wound)     Status: None   Collection Time: 08/30/22  1:51 PM   Specimen: PATH Other; Tissue  Result Value Ref Range Status   Specimen Description TISSUE  Final   Special Requests LEFT THUMB BONE  Final   Gram Stain   Final    NO WBC SEEN RARE GRAM POSITIVE COCCI IN CLUSTERS    Culture   Final    MODERATE METHICILLIN RESISTANT STAPHYLOCOCCUS AUREUS FEW HAEMOPHILUS PARAINFLUENZAE BETA LACTAMASE NEGATIVE NO ANAEROBES ISOLATED Performed at [facility], [place] [address] [place], [place] [address]    Report Status 09/04/2022 FINAL  Final   Organism ID, Bacteria METHICILLIN RESISTANT STAPHYLOCOCCUS AUREUS  Final      Susceptibility   Methicillin resistant staphylococcus aureus - MIC*    CIPROFLOXACIN 4 RESISTANT Resistant     ERYTHROMYCIN >=8 RESISTANT Resistant     GENTAMICIN <=0.5 SENSITIVE Sensitive     OXACILLIN >=4 RESISTANT Resistant     TETRACYCLINE <=1 SENSITIVE Sensitive     VANCOMYCIN <=0.5 SENSITIVE Sensitive     TRIMETH/SULFA <=10 SENSITIVE Sensitive     CLINDAMYCIN <=0.25 SENSITIVE Sensitive     RIFAMPIN <=0.5 SENSITIVE Sensitive     Inducible Clindamycin NEGATIVE Sensitive     * MODERATE METHICILLIN RESISTANT STAPHYLOCOCCUS AUREUS  Aerobic/Anaerobic Culture w Gram Stain (surgical/deep wound)     Status: None (Preliminary result)   Collection Time: 09/04/22  1:51 PM   Specimen: Soft Tissue, Other  Result Value Ref Range Status   Specimen Description TISSUE LEFT  THUMB  Final   Special Requests REVISION AMPUTATION  Final   Gram Stain   Final    FEW WBC PRESENT,BOTH PMN AND MONONUCLEAR FEW GRAM POSITIVE COCCI IN CLUSTERS RARE GRAM NEGATIVE RODS Performed at [facility], [address], [place], [place] [address]    Culture   Final    MODERATE METHICILLIN RESISTANT STAPHYLOCOCCUS AUREUS NO ANAEROBES ISOLATED; CULTURE IN PROGRESS FOR 5 DAYS    Report Status PENDING  Incomplete   Organism ID, Bacteria METHICILLIN RESISTANT STAPHYLOCOCCUS AUREUS  Final      Susceptibility   Methicillin resistant staphylococcus aureus - MIC*    CIPROFLOXACIN >=8 RESISTANT Resistant     ERYTHROMYCIN >=8 RESISTANT Resistant     GENTAMICIN <=0.5 SENSITIVE Sensitive     OXACILLIN >=4 RESISTANT Resistant     TETRACYCLINE <=1 SENSITIVE Sensitive     VANCOMYCIN 1 SENSITIVE Sensitive     TRIMETH/SULFA <=10 SENSITIVE Sensitive     CLINDAMYCIN <=0.25 SENSITIVE Sensitive     RIFAMPIN <=0.5 SENSITIVE Sensitive     Inducible Clindamycin NEGATIVE Sensitive     * MODERATE METHICILLIN RESISTANT STAPHYLOCOCCUS AUREUS    Procedures and diagnostic studies:  No results found.             LOS: 8 days   Louis [name]  [facility]   Pager on [email or web address]. If 7PM-7AM, please contact night-coverage at [email or web address]     09/07/2022, 9:21 AM

## 2022-09-08 DIAGNOSIS — M86042 Acute hematogenous osteomyelitis, left hand: Secondary | ICD-10-CM | POA: Diagnosis not present

## 2022-09-08 LAB — GLUCOSE, CAPILLARY
Glucose-Capillary: 236 mg/dL — ABNORMAL HIGH (ref 70–99)
Glucose-Capillary: 172 mg/dL — ABNORMAL HIGH (ref 70–99)
Glucose-Capillary: 125 mg/dL — ABNORMAL HIGH (ref 70–99)
Glucose-Capillary: 124 mg/dL — ABNORMAL HIGH (ref 70–99)

## 2022-09-08 LAB — CBC WITH DIFFERENTIAL/PLATELET
Abs Immature Granulocytes: 0.51 10*3/uL — ABNORMAL HIGH (ref 0.00–0.07)
Basophils Absolute: 0 10*3/uL (ref 0.0–0.1)
Basophils Relative: 0 %
Eosinophils Absolute: 0.1 10*3/uL (ref 0.0–0.5)
Eosinophils Relative: 1 %
HCT: 26.1 % — ABNORMAL LOW (ref 39.0–52.0)
Hemoglobin: 8.2 g/dL — ABNORMAL LOW (ref 13.0–17.0)
Immature Granulocytes: 3 %
Lymphocytes Relative: 7 %
Lymphs Abs: 1.2 10*3/uL (ref 0.7–4.0)
MCH: 29.7 pg (ref 26.0–34.0)
MCHC: 31.4 g/dL (ref 30.0–36.0)
MCV: 94.6 fL (ref 80.0–100.0)
Monocytes Absolute: 1.5 10*3/uL — ABNORMAL HIGH (ref 0.1–1.0)
Monocytes Relative: 9 %
Neutro Abs: 12.4 10*3/uL — ABNORMAL HIGH (ref 1.7–7.7)
Neutrophils Relative %: 80 %
Platelets: 201 10*3/uL (ref 150–400)
RBC: 2.76 MIL/uL — ABNORMAL LOW (ref 4.22–5.81)
RDW: 19.9 % — ABNORMAL HIGH (ref 11.5–15.5)
WBC: 15.7 10*3/uL — ABNORMAL HIGH (ref 4.0–10.5)
nRBC: 0.1 % (ref 0.0–0.2)

## 2022-09-08 LAB — BASIC METABOLIC PANEL
Anion gap: 14 (ref 5–15)
BUN: 40 mg/dL — ABNORMAL HIGH (ref 8–23)
CO2: 24 mmol/L (ref 22–32)
Calcium: 8.9 mg/dL (ref 8.9–10.3)
Chloride: 93 mmol/L — ABNORMAL LOW (ref 98–111)
Creatinine, Ser: 4.83 mg/dL — ABNORMAL HIGH (ref 0.61–1.24)
GFR, Estimated: 12 mL/min — ABNORMAL LOW (ref >60)
Glucose, Bld: 196 mg/dL — ABNORMAL HIGH (ref 70–99)
Potassium: 4 mmol/L (ref 3.5–5.1)
Sodium: 131 mmol/L — ABNORMAL LOW (ref 135–145)

## 2022-09-08 MED ORDER — PENTAFLUOROPROP-TETRAFLUOROETH EX AERO: 1.0000 | INHALATION_SPRAY | CUTANEOUS | Status: DC | PRN

## 2022-09-08 MED ORDER — HEPARIN SODIUM (PORCINE) 1000 UNIT/ML DIALYSIS: 1000.0000 [IU] | INTRAMUSCULAR | Status: DC | PRN

## 2022-09-08 MED ORDER — LIDOCAINE HCL (PF) 1 % IJ SOLN: 5.0000 mL | INTRAMUSCULAR | Status: DC | PRN

## 2022-09-08 MED ORDER — LIDOCAINE-PRILOCAINE 2.5-2.5 % EX CREA: 1.0000 | TOPICAL_CREAM | CUTANEOUS | Status: DC | PRN

## 2022-09-08 MED ORDER — ALTEPLASE 2 MG IJ SOLR: 2.0000 mg | Freq: Once | INTRAMUSCULAR | Status: DC | PRN

## 2022-09-08 NOTE — Progress Notes (Addendum)
Contacted by CSW with request for new clinic placement due to snf placement. Helene Kelp is requesting [place], [place], or [place]. Referral submitted to [facility] admissions for assistance with new clinic placement.   Melven Sartorius Renal Navigator [phone]  Addendum at 4:28 pm: Received request from [place] to please check with [facility] to see if they have a MWF 2nd shift available if pt is offered a bed from [facility]. Spoke to [place] at [facility] who states clinic does have a MWF 11:10 arrival for 11:30 chair time but if pt takes this chair then clinic cannot guarantee that clinic will have pt's TTS first shift available when pt returns home from rehab. This info was provided to CSW and also spoke to pt's wife via phone to make her aware of this information as well. Will await final decision from pt/family and response from [facility] admissions regarding other clinic options.

## 2022-09-08 NOTE — Progress Notes (Signed)
Mobility Specialist Progress Note   09/08/22 1240  Mobility  Activity Transferred from chair to bed  Level of Assistance Minimal assist, patient does 75% or more  Assistive Device Front wheel walker  Distance Ambulated (ft) 2 ft  Activity Response Tolerated well  $Mobility charge 1 Mobility   PT requesting assistance to get from chair to bed after sitting up for a couple of hours. Required MinA for initial stand, descent on to EOB and to get LE's back in bed.  No faults or buckling during this transfer. Pt left in bed with all needs met, and call bell in reach.  Holland Falling Mobility Specialist Acute Rehab Office:  [phone]

## 2022-09-08 NOTE — Progress Notes (Signed)
Physical Therapy Treatment Patient Details Name: Louis [name] MRN: [id number] DOB: July 31, 1946 Today's Date: 09/08/2022   History of Present Illness Patient is a 76 y/o male who presents on 10/9 with drainage, pain and swelling of left hand/thumb. 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 PMH includes CAD,. CHF, DM, ESRD on HD, HTN, paroxysmal A-fib, macular degeneration.    PT Comments    Patient progressing slowly towards PT goals. Reports feeling weaker today. Session focused on gait training and transfers. Requires assist of 2 for standing and ambulation with chair follow due to weakness in BLEs and inability to use left hand to stabilize on RW. Fatigues very quickly. Might benefit from using left platform RW for ambulation so pt can WB through LUE to assist with LE weakness. Continues to be appropriate for SNF. Will follow.    Recommendations for follow up therapy are one component of a multi-disciplinary discharge planning process, led by the attending physician.  Recommendations may be updated based on patient status, additional functional criteria and insurance authorization.  Follow Up Recommendations  Skilled nursing-short term rehab (<3 hours/day) Can patient physically be transported by private vehicle: No   Assistance Recommended at Discharge Frequent or constant Supervision/Assistance  Patient can return home with the following Two people to help with walking and/or transfers;A lot of help with bathing/dressing/bathroom;Assistance with cooking/housework;Assist for transportation   Equipment Recommendations  Rolling walker (2 wheels) (will need left platform RW)    Recommendations for Other Services       Precautions / Restrictions Precautions Precautions: Fall Restrictions Other Position/Activity Restrictions: assume NWB on L hand      Mobility  Bed Mobility Overal bed mobility: Needs Assistance Bed Mobility: Rolling, Sidelying to Sit Rolling: Mod assist Sidelying to sit: HOB elevated, Min assist       General bed mobility comments: Cues for log roll technique, assist with rolling and elevating trunk to get to EOB with use of rail.    Transfers Overall transfer level: Needs assistance Equipment used: Rolling walker (2 wheels) Transfers: Sit to/from Stand Sit to Stand: Mod assist, +2 physical assistance, From elevated surface           General transfer comment: Mod A of 2 to power to standing with cues for hand placement/technique, from EOB x2, bil knee instability with cues for foot/hand placement. Pt leaning on left forearm. Transferred to chair post ambulation.    Ambulation/Gait Ambulation/Gait assistance: Min assist, +2 physical assistance Gait Distance (Feet): 7 Feet Assistive device: Rolling walker (2 wheels) Gait Pattern/deviations: Step-to pattern, Step-through pattern, Decreased step length - right, Decreased step length - left, Narrow base of support Gait velocity: decreased     General Gait Details: Slow, step to gait with narrow BoS and assist for knee stability bilaterally during stance phase, leaning on left forearm.   Stairs             Wheelchair Mobility    Modified Rankin (Stroke Patients Only)       Balance Overall balance assessment: Needs assistance Sitting-balance support: Feet supported, No upper extremity supported Sitting balance-Leahy Scale: Fair Sitting balance - Comments: supervision for safety.   Standing balance support: During functional activity, Reliant on assistive device for balance Standing balance-Leahy Scale: Poor Standing balance comment: Assist of 2 for dynamic tasks due to knee instability  Cognition Arousal/Alertness: Awake/alert Behavior During Therapy: WFL for tasks assessed/performed Overall  Cognitive Status: Impaired/Different from baseline Area of Impairment: Problem solving                             Problem Solving: Slow processing, Decreased initiation, Difficulty sequencing, Requires verbal cues, Requires tactile cues General Comments: flat affect, seems to require increased time to process tasks        Exercises      General Comments General comments (skin integrity, edema, etc.): Wife present      Pertinent Vitals/Pain Pain Assessment Pain Assessment: Faces Faces Pain Scale: Hurts little more Pain Location: L hand Pain Descriptors / Indicators: Sore, Discomfort, Operative site guarding Pain Intervention(s): Monitored during session, Repositioned    Home Living                          Prior Function            PT Goals (current goals can now be found in the care plan section) Progress towards PT goals: Progressing toward goals    Frequency    Min 3X/week      PT Plan Current plan remains appropriate    Co-evaluation              AM-PAC PT "6 Clicks" Mobility   Outcome Measure  Help needed turning from your back to your side while in a flat bed without using bedrails?: A Little Help needed moving from lying on your back to sitting on the side of a flat bed without using bedrails?: A Lot Help needed moving to and from a bed to a chair (including a wheelchair)?: Total Help needed standing up from a chair using your arms (e.g., wheelchair or bedside chair)?: Total Help needed to walk in hospital room?: Total Help needed climbing 3-5 steps with a railing? : Total 6 Click Score: 9    End of Session Equipment Utilized During Treatment: Gait belt Activity Tolerance: Patient tolerated treatment well Patient left: in chair;with call bell/phone within reach;with chair alarm set;with family/visitor present Nurse Communication: Mobility status PT Visit Diagnosis: Muscle weakness (generalized) (M62.81);Difficulty in  walking, not elsewhere classified (R26.2);Unsteadiness on feet (R26.81)     Time: 0102-7253 PT Time Calculation (min) (ACUTE ONLY): 20 min  Charges:  $Gait Training: 8-22 mins                     Marisa Severin, PT, DPT Acute Rehabilitation Services Secure chat preferred Office [place] 09/08/2022, 11:24 AM

## 2022-09-08 NOTE — TOC Progression Note (Addendum)
Transition of Care [facility]) - Progression Note    Patient Details  Name: Louis [name] MRN: [id number] Date of Birth: 08-18-1946  [facility] CM/SW Contact  Joanne Chars, LCSW Phone Number: 09/08/2022, 9:51 AM  Clinical Narrative:   CSW spoke with pt and wife in room, discussed bed offers, [facility] and [facility] cannot offer.  Other offers presented.  Wife asking for response from [facility] and [facility]..    CSW reached out to [facility]. [facility] cannot offer bed, cannot do HD transport.    Expected Discharge Plan: [place] (vs SNF) Barriers to Discharge: Continued Medical Work up  Expected Discharge Plan and Services Expected Discharge Plan: [place] (vs SNF)   Discharge Planning Services: CM Consult   Living arrangements for the past 2 months: Single Family Home                                       Social Determinants of Health (SDOH) Interventions    Readmission Risk Interventions     No data to display

## 2022-09-08 NOTE — Inpatient Diabetes Management (Signed)
Inpatient Diabetes Program Recommendations  AACE/ADA: New Consensus Statement on Inpatient Glycemic Control (2015)  Target Ranges:  Prepandial:   less than 140 mg/dL      Peak postprandial:   less than 180 mg/dL (1-2 hours)      Critically ill patients:  140 - 180 mg/dL   Lab Results  Component Value Date   GLUCAP 236 (H) 09/08/2022   HGBA1C 6.2 03/12/2022    Review of Glycemic Control  Latest Reference Range & Units 09/07/22 07:41 09/07/22 16:58 09/07/22 20:45 09/08/22 09:03  Glucose-Capillary 70 - 99 mg/dL 284 (H) 184 (H) 220 (H) 236 (H)  (H): Data is abnormally high  Diabetes history: DM2 Outpatient Diabetes medications: NPH 10 units BID & Regular 0-15 units TID Current orders for Inpatient glycemic control:  Novolog 0-24 units TID and 4 units TID with meals, Levemir 30 units QAM   Inpatient Diabetes Program Recommendations:     Novolog 0-20 units TID Novolog 0-5 units QHS Novolog 6 units TID with meals if consumes at least 50%  Will continue to follow while inpatient.  Thank you, Reche Dixon, MSN, [place] Diabetes Coordinator Inpatient Diabetes Program [phone] (team pager from 8a-5p)

## 2022-09-08 NOTE — Progress Notes (Addendum)
[facility] Progress Note   Subjective:    Seen and examined patient at bedside. Patient's wife also at bedside. Tolerated yesterday's HD with net UF 3.9L. He denies SOB, CP, and N/V. Next HD 10/21.  Objective Vitals:   09/07/22 1300 09/07/22 1549 09/07/22 1949 09/08/22 0353  BP: (!) 96/58 100/68 (!) 97/56 (!) 91/54  Pulse: 79 83 74 63  Resp: '18 20 17   '$ Temp: (!) 97.4 F (36.3 C)  97.8 F (36.6 C) 98.2 F (36.8 C)  TempSrc:   Oral Oral  SpO2: 99% 100%    Weight:      Height:       Physical Exam General:chronically ill appearing male in NAD Heart:RRR, no mrg Lungs:CTAB, nml WOB on RA Abdomen:soft, NTND Extremities:1-2+ LE edema R>L and BL pedal edema. R hand/forearm dressed, loose ACE wrap covering with some blood present on underside Dialysis Access: [facility]   Filed Weights   09/05/22 1250 09/05/22 1305 09/07/22 0809  Weight: 100.4 kg 100.4 kg 104.4 kg    Intake/Output Summary (Last 24 hours) at 09/08/2022 0829 Last data filed at 09/07/2022 1800 Gross per 24 hour  Intake 340 ml  Output 3900 ml  Net -3560 ml    Additional Objective Labs: Basic Metabolic Panel: Recent Labs  Lab 09/02/22 0702 09/02/22 0834 09/04/22 1024 09/05/22 0814 09/08/22 0308  NA 127* 127* 129* 129* 131*  K 4.6 4.3 4.2 4.8 4.0  CL 89* 88* 91* 88* 93*  CO2 '23 25 24 27 24  '$ GLUCOSE 117* 112* 158* 256* 196*  BUN 76* 75* 72* 82* 40*  CREATININE 5.92* 5.95* 6.18* 6.76* 4.83*  CALCIUM 8.6* 8.6* 8.4* 9.2 8.9  PHOS 5.9* 5.8*  --  5.4*  --    Liver Function Tests: Recent Labs  Lab 09/02/22 0702 09/02/22 0834 09/05/22 0814  ALBUMIN 2.0* 1.9* 2.0*   No results for input(s): "LIPASE", "AMYLASE" in the last 168 hours. CBC: Recent Labs  Lab 09/04/22 1024 09/05/22 0814 09/06/22 0312 09/07/22 0829 09/08/22 0308  WBC 14.3* 15.6* 16.9* 15.2* 15.7*  NEUTROABS  --   --  14.2* 12.4* 12.4*  HGB 10.3* 9.8* 9.5* 8.3* 8.2*  HCT 33.6* 30.8* 30.8* 25.6* 26.1*  MCV 93.9 93.6 94.2  94.8 94.6  PLT 210 212 222 204 201   Blood Culture    Component Value Date/Time   SDES TISSUE LEFT THUMB 09/04/2022 1351   SPECREQUEST REVISION AMPUTATION 09/04/2022 1351   CULT  09/04/2022 1351    MODERATE METHICILLIN RESISTANT STAPHYLOCOCCUS AUREUS NO ANAEROBES ISOLATED; CULTURE IN PROGRESS FOR 5 DAYS    REPTSTATUS PENDING 09/04/2022 1351    Cardiac Enzymes: Recent Labs  Lab 09/07/22 0437  CKTOTAL 58   CBG: Recent Labs  Lab 09/06/22 1758 09/06/22 2047 09/07/22 0741 09/07/22 1658 09/07/22 2045  GLUCAP 304* 257* 284* 184* 220*   Iron Studies: No results for input(s): "IRON", "TIBC", "TRANSFERRIN", "FERRITIN" in the last 72 hours. Lab Results  Component Value Date   INR 2.5 06/27/2021   INR 2.1 06/08/2021   INR 2.3 05/18/2021   Studies/Results: No results found.  Medications:  cefTAZidime (FORTAZ)  IV 2 g (09/07/22 1604)   DAPTOmycin (CUBICIN) 650 mg in sodium chloride 0.9 % IVPB 650 mg (09/07/22 2009)   [START ON 09/09/2022] DAPTOmycin (CUBICIN) 950 mg in sodium chloride 0.9 % IVPB      Chlorhexidine Gluconate Cloth  6 each Topical Q0600   Chlorhexidine Gluconate Cloth  6 each Topical Q0600   Chlorhexidine Gluconate  Cloth  6 each Topical Q0600   [START ON 09/14/2022] darbepoetin (ARANESP) injection - DIALYSIS  150 mcg Intravenous Q Thu-HD   darbepoetin (ARANESP) injection - DIALYSIS  90 mcg Intravenous Once   furosemide  80 mg Oral BID   insulin aspart  0-24 Units Subcutaneous TID WC   insulin aspart  4 Units Subcutaneous TID WC   insulin detemir  30 Units Subcutaneous Daily   midodrine  10 mg Oral Q T,Th,Sa-HD   pantoprazole  40 mg Oral BID   rosuvastatin  10 mg Oral Daily   sevelamer carbonate  1,600 mg Oral TID WC    Dialysis Orders: TTS SW 4h 450/ 500  101 kg  3K/2.5 bath  Hep none LUA AVF - last HD post HD 101.6kg on 10/7 - last Hb 10.0 10/5 - mircera 50 q 2, last 9/26 - venofer 100 mg tiw IV thru 10/19 - doxercalciferol 5 ug ttw IV  Home  meds include - eliquis, furosemide 40 qd, isordil 20, midodrine 10 prn, novolin insulin, rosuvastatin, vits/ supps / prns  Assessment/Plan: ESRD- on HD TTS.  Next HD 09/09/22. Push UF as tolerated. Volume overload/BP- midodrine 10 mg TTS predialysis ordered recently. Noted IV Albumin given yesterday. Tolerating UF 4L each HD here.  +hyponatremia, improved with HD.  Remains volume overloaded, continue max UF as tolerated. Will need dry weight lowered on d/c. She wants to continue diuretic-  does not rally make a lot of urine so I dont think its doing much but she is really wantig to continue  Osteomyelitis left hand/thumb status post left arm AV fistula ligation/ left thumb flexor sheath I&D , bone curettage and debridement of osteomyelitis of the distal phalanx on 10/11 and returned to OR on 10/16 for open debridement w/L thumb amputation thru proximal phalanx by Dr. Apolonio Schneiders (Hand ortho)- Had some bleeding yesterday - Hand surgeon notified.  ID antibiotic plan now daptomycin and Fortaz  6wk total follow-up in ID clinic on 10/04/2022 and per [facility] has adequate blood flow no angiogram needed. Dysphagia/distal esophageal narrowing (on barium swallow study )- EGD w/esophageal dilation yesterday.  Symptoms improved, now on regular diet - change to renal diet.   DM2- plan per admit team Anemia of ESRD -No iron with infection-  now dropping-  ESA recently raised. Secondary hyperparathyroidism - phos and Ca in goal. Renvela 800 increased 1600 mg.   Nutrition - Renal diet w/fluid restrictions once advanced.  Permanent A Fib - Eliquis on hold.   Tobie Poet, NP [facility] 09/08/2022,8:29 AM  LOS: 9 days    Patient seen and examined, agree with above note with above modifications. Pt is always without complaint-  trying to get volume down with some success-  looking at SNF placement which might entail a temporary change in HD units  [name], MD 09/08/2022

## 2022-09-08 NOTE — Care Management Important Message (Signed)
Important Message  Patient Details  Name: Louis [name] MRN: [id number] Date of Birth: 14-Apr-1946   Medicare Important Message Given:  Yes     Hannah Beat 09/08/2022, 9:24 AM

## 2022-09-08 NOTE — Progress Notes (Addendum)
Progress Note    Louis [name]  ONG:[id number] DOB: August 11, 1946  DOA: 08/28/2022 PCP: Louis Marry, MD      Brief Narrative:    Medical records reviewed and are as summarized below:  Louis [name] is a 76 y.o. male  with history of ESRD on hemodialysis, type 2 diabetes, CHF, CAD, hyperlipidemia, hypertension presented to the ER at the recommendation of his family medicine physician.  Patient had left hand swelling  for the last 2 days.  He has noticed bloody drainage so he presented to the ER.  He has had wounds on his hands for a year however never to this severity.  In the ER he had x-rays obtained which demonstrated extensive soft tissue swelling of the left thumb concern for osteomyelitis. Hand surgery was consulted in the ED the patient was admitted for IV antibiotics.        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 (Obesity)  Acute osteomyelitis of left thumb and right ring finger: Orthopedics as well as ID on board.  Patient underwent left upper arm AV fistula ligation and placement of left IJ TDC by vascular surgery on 08/29/2022.  He then underwent Left thumb flexor sheath incision and drainage by Dr. Caralyn Guile on 08/30/2022. Intraoperative cultures are growing MRSA.  ID on board but signed off,  currently patient  daptomycin and Fortaz per ID and they recommend continuing these for 6 weeks with dialysis and follow-up with ID in the clinic on 10/04/2022.  He was also evaluated by the vascular surgeon.  Angiogram was not performed because he has adequate blood flow..  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 Caralyn Guile on 09/04/2022.   Reportedly, he had profuse bleeding from the left hand surgical site on 09/06/2022.  His wife said she was contacted by Dr. Caralyn Guile yesterday via phone, and he plans to do a follow-up visit today or tomorrow.  ESRD on HD: Follow-up with nephrologist for hemodialysis tomorrow.    Hyperlipidemia: Continue Crestor.   Type 2 diabetes mellitus with hyperglycemia: Continue Semglee and NovoLog as needed   Likely chronic hyponatremia: Asymptomatic   Permanent atrial fibrillation: Rates controlled.  He is not on any rate control medications. Orthopedics cleared him to resume Eliquis on 09/01/2022.  However, this was held for EGD on 09/03/2022.  Given recent bleeding from the left hand surgical wound, Eliquis has been held again.  Eliquis may be restarted tomorrow after reassessment left hand surgical wound by Dr. Caralyn Guile.  Acute blood loss anemia: Hemoglobin down from 10.6-8.2.  Monitor H&H and transfuse as needed.   Dysphagia: Patient was noted to have some aspiration event on 08/31/2022 with the nurses.  Evaluated by SLP, they suspected esophageal dysphagia and recommended esophagogram which is completed and shows distal esophageal stenosis.  Seen by GI, underwent EGD and esophageal dilatation on 09/05/2022.   He was also found to have nonbleeding gastric ulcer with clean base which was biopsied and erythematous mucosa in gastric body.  It was biopsied as well.  He is tolerating renal diabetic renal diet      Diet Order             Diet renal/carb modified with fluid restriction Diet-HS Snack? Nothing; Fluid restriction: 1200 mL Fluid; Room service appropriate? Yes; Fluid consistency: Thin  Diet effective now                            Consultants: Copy Nephrologist ID specialist Vascular surgeon  Procedures: EGD on 09/05/2022 Left thumb amputation on 09/04/2022    Medications:    Chlorhexidine Gluconate Cloth  6 each Topical Q0600   [START ON 09/14/2022] darbepoetin (ARANESP) injection - DIALYSIS  150 mcg Intravenous Q Thu-HD   darbepoetin (ARANESP) injection - DIALYSIS  90 mcg Intravenous Once   furosemide  80 mg Oral BID   insulin aspart  0-24 Units Subcutaneous TID WC   insulin aspart  4 Units Subcutaneous TID WC   insulin detemir  30 Units Subcutaneous Daily   midodrine  10 mg Oral Q T,Th,Sa-HD   pantoprazole  40 mg Oral BID   rosuvastatin  10 mg Oral Daily   sevelamer carbonate  1,600 mg Oral TID WC   Continuous Infusions:  cefTAZidime (FORTAZ)  IV 2 g (09/07/22 1604)   DAPTOmycin (CUBICIN) 650 mg in sodium chloride 0.9 % IVPB 650 mg (09/07/22 2009)   [START ON 09/09/2022] DAPTOmycin (CUBICIN) 950 mg in sodium chloride 0.9 % IVPB       Anti-infectives (From admission, onward)    Start     Dose/Rate Route Frequency Ordered Stop   09/09/22 2000  DAPTOmycin (CUBICIN) 950 mg in sodium chloride 0.9 % IVPB        12 mg/kg  79.8 kg (Adjusted) 138 mL/hr over 30 Minutes Intravenous Every Sat (1800) 09/06/22 1530     09/07/22 2000  DAPTOmycin (CUBICIN) 650 mg in sodium chloride 0.9 % IVPB        8 mg/kg  79.8 kg (Adjusted) 126 mL/hr over 30 Minutes Intravenous Once per day on Tue Thu 09/06/22 1530     09/02/22 1200  cefTAZidime (FORTAZ) 2 g in sodium chloride 0.9 % 100 mL IVPB        2 g 200 mL/hr over 30 Minutes Intravenous Every T-Th-Sa (Hemodialysis) 09/01/22 1654     09/01/22 2000  DAPTOmycin (CUBICIN) 650 mg in sodium chloride 0.9 % IVPB   Status:  Discontinued        8 mg/kg  81.9 kg (Adjusted) 126 mL/hr over 30 Minutes Intravenous Every 48 hours 08/31/22 0738 09/06/22 1530   09/01/22 1830  cefTAZidime (FORTAZ) 1 g in sodium chloride 0.9 % 100 mL IVPB        1 g 200 mL/hr over 30 Minutes Intravenous  Once 09/01/22 1654 09/01/22 2212   08/31/22 2200  DAPTOmycin (CUBICIN) 650 mg in sodium chloride 0.9 % IVPB  Status:  Discontinued        6 mg/kg  105.7 kg 126 mL/hr over 30 Minutes Intravenous Every 48 hours 08/30/22 1822 08/31/22 0738   08/30/22 1915  DAPTOmycin (CUBICIN) 650 mg in sodium  chloride 0.9 % IVPB        6 mg/kg  105.7 kg 126 mL/hr over 30 Minutes Intravenous  Once 08/30/22 1822 08/30/22 2126   08/30/22 1800  cefTRIAXone (ROCEPHIN) 2 g in sodium chloride 0.9 % 100 mL IVPB  Status:  Discontinued        2 g 200 mL/hr over 30 Minutes Intravenous Every 24 hours 08/30/22 1018 09/01/22 0958   08/29/22 1500  ceFAZolin (ANCEF) IVPB 2g/100 mL premix  Status:  Discontinued        2 g 200 mL/hr over 30 Minutes Intravenous On call to O.R. 08/29/22 1454 08/29/22 1705   08/29/22 1430  ceFAZolin (ANCEF) IVPB 2g/100 mL premix        2 g 200 mL/hr over 30 Minutes Intravenous  Once 08/29/22 1418 08/29/22 1456   08/29/22 1417  ceFAZolin (ANCEF) 2-4 GM/100ML-% IVPB       Note to Pharmacy: Cameron Sprang M: cabinet override      08/29/22 1417 08/29/22 1524   08/29/22 1400  piperacillin-tazobactam (ZOSYN) IVPB 2.25 g  Status:  Discontinued        2.25 g 100 mL/hr over 30 Minutes Intravenous Every 8 hours 08/29/22 0501 08/30/22 1018   08/29/22 0500  linezolid (ZYVOX) IVPB 600 mg  Status:  Discontinued        600 mg 300 mL/hr over 60 Minutes Intravenous Every 12 hours 08/29/22 0458 08/30/22 1738   08/29/22 0500  piperacillin-tazobactam (ZOSYN) IVPB 3.375 g        3.375 g 100 mL/hr over 30 Minutes Intravenous  Once 08/29/22 0459 08/29/22 0720              Family Communication/Anticipated D/C date and plan/Code Status    DVT prophylaxis: SCD's Start: 09/04/22 2028 SCD's Start: 08/30/22 1455 SCDs Start: 08/29/22 0549     Code Status: Full Code  Family Communication: Plan discussed with his wife at the bedside Disposition Plan: Discharge to SNF in 1 to 2 days   Status is: Inpatient Remains inpatient appropriate because: Awaiting placement to SNF      Subjective:   Interval events noted.  He has no complaints.  No bleeding from Surgical Wound.  No Shortness of Breath or Chest Pain.  His wife was at the bedside.  Objective:    Vitals:   09/07/22 1549 09/07/22 1949 09/08/22 0353 09/08/22 0903  BP: 100/68 (!) 97/56 (!) 91/54 (!) 108/57  Pulse: 83 74 63 72  Resp: '20 17  15  '$ Temp:  97.8 F (36.6 C) 98.2 F (36.8 C) 97.8 F (36.6 C)  TempSrc:  Oral Oral Oral  SpO2: 100%   98%  Weight:      Height:       No data found.   Intake/Output Summary (Last 24 hours) at 09/08/2022 1148 Last data filed at 09/07/2022 1800 Gross per 24 hour  Intake 340 ml  Output 3900 ml  Net -3560 ml   Filed Weights   09/05/22 1250 09/05/22 1305 09/07/22 0809  Weight: 100.4 kg 100.4 kg 104.4 kg    Exam:  GEN: NAD SKIN: Warm and dry EYES: No ENT: MMM CV: Regular rate and rhythm PULM: CTA B ABD: soft, obese, NT, +BS CNS: AAO x 3, non focal EXT: Mild bilateral leg and pedal edema, no tenderness.  Dressing on left hand surgical wound is intact but stained with blood.          Data Reviewed:   I have personally reviewed following  labs and imaging studies:  Labs: Labs show the following:   Basic Metabolic Panel: Recent Labs  Lab 09/02/22 0702 09/02/22 0834 09/04/22 1024 09/05/22 0814 09/08/22 0308  NA 127* 127* 129* 129* 131*  K 4.6 4.3 4.2 4.8 4.0  CL 89* 88* 91* 88* 93*  CO2 '23 25 24 27 24  '$ GLUCOSE 117* 112* 158* 256* 196*  BUN 76* 75* 72* 82* 40*  CREATININE 5.92* 5.95* 6.18* 6.76* 4.83*  CALCIUM 8.6* 8.6* 8.4* 9.2 8.9  PHOS 5.9* 5.8*  --  5.4*  --    GFR Estimated  Creatinine Clearance: 15 mL/min (A) (by C-G formula based on SCr of 4.83 mg/dL (H)). Liver Function Tests: Recent Labs  Lab 09/02/22 0702 09/02/22 0834 09/05/22 0814  ALBUMIN 2.0* 1.9* 2.0*   No results for input(s): "LIPASE", "AMYLASE" in the last 168 hours. No results for input(s): "AMMONIA" in the last 168 hours. Coagulation profile No results for input(s): "INR", "PROTIME" in the last 168 hours.  CBC: Recent Labs  Lab 09/04/22 1024 09/05/22 0814 09/06/22 0312 09/07/22 0829 09/08/22 0308  WBC 14.3* 15.6* 16.9* 15.2* 15.7*  NEUTROABS  --   --  14.2* 12.4* 12.4*  HGB 10.3* 9.8* 9.5* 8.3* 8.2*  HCT 33.6* 30.8* 30.8* 25.6* 26.1*  MCV 93.9 93.6 94.2 94.8 94.6  PLT 210 212 222 204 201   Cardiac Enzymes: Recent Labs  Lab 09/07/22 0437  CKTOTAL 58   BNP (last 3 results) No results for input(s): "PROBNP" in the last 8760 hours. CBG: Recent Labs  Lab 09/07/22 0741 09/07/22 1658 09/07/22 2045 09/08/22 0903 09/08/22 1123  GLUCAP 284* 184* 220* 236* 172*   D-Dimer: No results for input(s): "DDIMER" in the last 72 hours. Hgb A1c: No results for input(s): "HGBA1C" in the last 72 hours. Lipid Profile: No results for input(s): "CHOL", "HDL", "LDLCALC", "TRIG", "CHOLHDL", "LDLDIRECT" in the last 72 hours. Thyroid function studies: No results for input(s): "TSH", "T4TOTAL", "T3FREE", "THYROIDAB" in the last 72 hours.  Invalid input(s): "FREET3" Anemia work up: No results for input(s): "VITAMINB12", "FOLATE", "FERRITIN", "TIBC", "IRON", "RETICCTPCT" in the last 72 hours. Sepsis Labs: Recent Labs  Lab 09/05/22 0814 09/06/22 0312 09/07/22 0829 09/08/22 0308  WBC 15.6* 16.9* 15.2* 15.7*    Microbiology Recent Results (from the past 240 hour(s))  MRSA Next Gen by PCR, Nasal     Status: Abnormal   Collection Time: 08/30/22  3:41 AM   Specimen: Nasal Mucosa; Nasal Swab  Result Value Ref Range Status   MRSA by PCR Next Gen DETECTED (A) NOT DETECTED Final     Comment: CRITICAL RESULT CALLED TO, READ BACK BY AND VERIFIED WITH:  C/ ERIC N., RN 08/30/22 0548 A. LAFRANCE (NOTE) The GeneXpert MRSA Assay (FDA approved for NASAL specimens only), is one component of a comprehensive MRSA colonization surveillance program. It is not intended to diagnose MRSA infection nor to guide or monitor treatment for MRSA infections. Test performance is not FDA approved in patients less than 94 years old. Performed at [facility], [place] [address], [place], [place] [address]   Aerobic/Anaerobic Culture w Gram Stain (surgical/deep wound)     Status: None   Collection Time: 08/30/22  1:43 PM   Specimen: PATH Other; Body Fluid  Result Value Ref Range Status   Specimen Description WOUND  Final   Special Requests LEFT THUMB ABSCESS SPEC A  Final   Gram Stain   Final    RARE WBC PRESENT, PREDOMINANTLY MONONUCLEAR FEW GRAM POSITIVE  COCCI IN PAIRS IN CLUSTERS RARE GRAM NEGATIVE RODS    Culture   Final    MODERATE METHICILLIN RESISTANT STAPHYLOCOCCUS AUREUS MODERATE HAEMOPHILUS PARAINFLUENZAE BETA LACTAMASE NEGATIVE NO ANAEROBES ISOLATED Performed at [facility], [place] [address], [place], [place] [address]    Report Status 09/04/2022 FINAL  Final   Organism ID, Bacteria METHICILLIN RESISTANT STAPHYLOCOCCUS AUREUS  Final      Susceptibility   Methicillin resistant staphylococcus aureus - MIC*    CIPROFLOXACIN 4 RESISTANT Resistant     ERYTHROMYCIN >=8 RESISTANT Resistant     GENTAMICIN <=0.5 SENSITIVE Sensitive     OXACILLIN >=4 RESISTANT Resistant     TETRACYCLINE <=1 SENSITIVE Sensitive     VANCOMYCIN 1 SENSITIVE Sensitive     TRIMETH/SULFA <=10 SENSITIVE Sensitive     CLINDAMYCIN <=0.25 SENSITIVE Sensitive     RIFAMPIN <=0.5 SENSITIVE Sensitive     Inducible Clindamycin NEGATIVE Sensitive     * MODERATE METHICILLIN RESISTANT STAPHYLOCOCCUS AUREUS  Aerobic/Anaerobic Culture w Gram Stain (surgical/deep wound)     Status: None   Collection Time:  08/30/22  1:51 PM   Specimen: PATH Other; Tissue  Result Value Ref Range Status   Specimen Description TISSUE  Final   Special Requests LEFT THUMB BONE  Final   Gram Stain   Final    NO WBC SEEN RARE GRAM POSITIVE COCCI IN CLUSTERS    Culture   Final    MODERATE METHICILLIN RESISTANT STAPHYLOCOCCUS AUREUS FEW HAEMOPHILUS PARAINFLUENZAE BETA LACTAMASE NEGATIVE NO ANAEROBES ISOLATED Performed at [facility], [address], [place], [place] [address]    Report Status 09/04/2022 FINAL  Final   Organism ID, Bacteria METHICILLIN RESISTANT STAPHYLOCOCCUS AUREUS  Final      Susceptibility   Methicillin resistant staphylococcus aureus - MIC*    CIPROFLOXACIN 4 RESISTANT Resistant     ERYTHROMYCIN >=8 RESISTANT Resistant     GENTAMICIN <=0.5 SENSITIVE Sensitive     OXACILLIN >=4 RESISTANT Resistant     TETRACYCLINE <=1 SENSITIVE Sensitive     VANCOMYCIN <=0.5 SENSITIVE Sensitive     TRIMETH/SULFA <=10 SENSITIVE Sensitive     CLINDAMYCIN <=0.25 SENSITIVE Sensitive     RIFAMPIN <=0.5 SENSITIVE Sensitive     Inducible Clindamycin NEGATIVE Sensitive     * MODERATE METHICILLIN RESISTANT STAPHYLOCOCCUS AUREUS  Aerobic/Anaerobic Culture w Gram Stain (surgical/deep wound)     Status: None (Preliminary result)   Collection Time: 09/04/22  1:51 PM   Specimen: Soft Tissue, Other  Result Value Ref Range Status   Specimen Description TISSUE LEFT THUMB  Final   Special Requests REVISION AMPUTATION  Final   Gram Stain   Final    FEW WBC PRESENT,BOTH PMN AND MONONUCLEAR FEW GRAM POSITIVE COCCI IN CLUSTERS RARE GRAM NEGATIVE RODS Performed at [facility], [address] [place], [place] [address]    Culture   Final    MODERATE METHICILLIN RESISTANT STAPHYLOCOCCUS AUREUS NO ANAEROBES ISOLATED; CULTURE IN PROGRESS FOR 5 DAYS    Report Status PENDING  Incomplete   Organism ID, Bacteria METHICILLIN RESISTANT STAPHYLOCOCCUS AUREUS  Final      Susceptibility   Methicillin resistant  staphylococcus aureus - MIC*    CIPROFLOXACIN >=8 RESISTANT Resistant     ERYTHROMYCIN >=8 RESISTANT Resistant     GENTAMICIN <=0.5 SENSITIVE Sensitive     OXACILLIN >=4 RESISTANT Resistant     TETRACYCLINE <=1 SENSITIVE Sensitive     VANCOMYCIN 1 SENSITIVE Sensitive  TRIMETH/SULFA <=10 SENSITIVE Sensitive     CLINDAMYCIN <=0.25 SENSITIVE Sensitive     RIFAMPIN <=0.5 SENSITIVE Sensitive     Inducible Clindamycin NEGATIVE Sensitive     * MODERATE METHICILLIN RESISTANT STAPHYLOCOCCUS AUREUS    Procedures and diagnostic studies:  No results found.             LOS: 9 days   Dakwon Wenberg  [employer], advertising on [email or web address]. If 7PM-7AM, please contact night-coverage at [email or web address]     09/08/2022, 11:48 AM

## 2022-09-08 NOTE — TOC Progression Note (Signed)
Transition of Care [facility]) - Progression Note    Patient Details  Name: Louis [name] MRN: [id number] Date of Birth: 09-Apr-1946  [facility] CM/SW Contact  Joanne Chars, LCSW Phone Number: 09/08/2022, 11:01 AM  Clinical Narrative:   CSW spoke with pt wife with updates.  [facility] does offer bed, she would like to accept this offer.    CSW spoke with Kitty/[facility], they cannot do HD at [facility], can do [facility], [place], or [facility].  They can do a TTS schedule, but would prefer MWF if available.  CSW spoke with Tracy/[facility] and she will see what HD options are available.      Expected Discharge Plan: [place] (vs SNF) Barriers to Discharge: Continued Medical Work up  Expected Discharge Plan and Services Expected Discharge Plan: [place] (vs SNF)   Discharge Planning Services: CM Consult   Living arrangements for the past 2 months: Single Family Home                                       Social Determinants of Health (SDOH) Interventions    Readmission Risk Interventions     No data to display

## 2022-09-09 DIAGNOSIS — M86042 Acute hematogenous osteomyelitis, left hand: Secondary | ICD-10-CM | POA: Diagnosis not present

## 2022-09-09 DIAGNOSIS — I10 Essential (primary) hypertension: Secondary | ICD-10-CM

## 2022-09-09 DIAGNOSIS — N179 Acute kidney failure, unspecified: Secondary | ICD-10-CM | POA: Diagnosis not present

## 2022-09-09 DIAGNOSIS — A480 Gas gangrene: Secondary | ICD-10-CM | POA: Diagnosis not present

## 2022-09-09 DIAGNOSIS — I482 Chronic atrial fibrillation, unspecified: Secondary | ICD-10-CM | POA: Diagnosis not present

## 2022-09-09 DIAGNOSIS — E11610 Type 2 diabetes mellitus with diabetic neuropathic arthropathy: Secondary | ICD-10-CM

## 2022-09-09 LAB — CBC WITH DIFFERENTIAL/PLATELET
Abs Immature Granulocytes: 0.34 10*3/uL — ABNORMAL HIGH (ref 0.00–0.07)
Basophils Absolute: 0 10*3/uL (ref 0.0–0.1)
Basophils Relative: 0 %
Eosinophils Absolute: 0.1 10*3/uL (ref 0.0–0.5)
Eosinophils Relative: 1 %
HCT: 26.2 % — ABNORMAL LOW (ref 39.0–52.0)
Hemoglobin: 8.4 g/dL — ABNORMAL LOW (ref 13.0–17.0)
Immature Granulocytes: 2 %
Lymphocytes Relative: 5 %
Lymphs Abs: 0.9 10*3/uL (ref 0.7–4.0)
MCH: 30.5 pg (ref 26.0–34.0)
MCHC: 32.1 g/dL (ref 30.0–36.0)
MCV: 95.3 fL (ref 80.0–100.0)
Monocytes Absolute: 1.3 10*3/uL — ABNORMAL HIGH (ref 0.1–1.0)
Monocytes Relative: 8 %
Neutro Abs: 14.9 10*3/uL — ABNORMAL HIGH (ref 1.7–7.7)
Neutrophils Relative %: 84 %
Platelets: 210 10*3/uL (ref 150–400)
RBC: 2.75 MIL/uL — ABNORMAL LOW (ref 4.22–5.81)
RDW: 20.3 % — ABNORMAL HIGH (ref 11.5–15.5)
WBC: 17.5 10*3/uL — ABNORMAL HIGH (ref 4.0–10.5)
nRBC: 0 % (ref 0.0–0.2)

## 2022-09-09 LAB — GLUCOSE, CAPILLARY
Glucose-Capillary: 152 mg/dL — ABNORMAL HIGH (ref 70–99)
Glucose-Capillary: 115 mg/dL — ABNORMAL HIGH (ref 70–99)
Glucose-Capillary: 135 mg/dL — ABNORMAL HIGH (ref 70–99)
Glucose-Capillary: 146 mg/dL — ABNORMAL HIGH (ref 70–99)

## 2022-09-09 LAB — AEROBIC/ANAEROBIC CULTURE W GRAM STAIN (SURGICAL/DEEP WOUND)

## 2022-09-09 MED ORDER — HEPARIN SODIUM (PORCINE) 1000 UNIT/ML IJ SOLN
INTRAMUSCULAR | Status: AC
  Administered 2022-09-09: 4200 [IU]
  Filled 2022-09-09: qty 5

## 2022-09-09 NOTE — Plan of Care (Signed)

## 2022-09-09 NOTE — Progress Notes (Signed)
[facility] PROGRESS NOTE    Progress Note  Louis [name]  DGU:[id number] DOB: 08-04-1946 DOA: 08/28/2022 PCP: [name], MD     Brief Narrative:   Louis [name] is an 76 y.o. 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 ID was consulted they recommended IV daptomycin and Tressie Ellis for 6 weeks and follow-up with them in the clinic on 10/04/2022 they have signed off. He underwent debridement with left thumb excisional debridement and left thumb amputation on 09/04/2022. Reportedly profuse bleeding of the left hand surgical site on 09/07/2022.  End-stage renal disease on hemodialysis: Nephrology on board for dialysis today.  Hyperlipidemia: Continue Crestor.  Diabetes mellitus type 2 with hyperglycemia: She was started on long-acting insulin sliding scale his blood glucose fairly controlled today.  Chronic atrial fibrillation: Currently on a rate controlling medication. Anticoagulation had to be held due to profuse bleeding from the left hand at the surgical site. Orthopedic surgery will evaluate today and recommend when to start Eliquis.  Acute blood loss anemia: Hemoglobin dropped from 10 on admission to 8.4 this morning.  Dysphagia due to esophageal  stricture: She was noted to have an aspiration event on 08/31/2022 Esophagogram was done that showed distal stenosis GI was consulted underwent EGD with esophageal dilation on 09/05/2022.  He was also found to have nonbleeding gastric ulcer with a clean base started on Protonix p.o. twice daily.  Chronic hyponatremia Asymptomatic.  Obesity (BMI 30-39.9) Noted  DVT prophylaxis: lovenox Family Communication:  Status is: Inpatient Remains inpatient appropriate because: Osteomyelitis and cellulitis    Code Status:     Code Status Orders  (From admission, onward)           Start     Ordered   08/29/22 0550  Full code  Continuous        08/29/22 0549           Code Status History     Date Active Date Inactive Code Status Order ID Comments User Context   12/14/2018 1635 12/20/2018 1337 Full Code [id number]  Lady Deutscher, MD Inpatient   02/02/2017 1415 02/03/2017 1828 Full Code [id number]  Radene Gunning, NP ED      Advance Directive Documentation    Flowsheet Row Most Recent Value  Type of Advance Directive Healthcare Power of Attorney, Living will  Pre-existing out of facility DNR order (yellow form or pink MOST form) --  "MOST" Form in Place? --         IV Access:   Peripheral IV   Procedures and diagnostic studies:   No results found.   Medical Consultants:   None.   Subjective:    [name] relates his pain is controlled.  Objective:    Vitals:   09/08/22 0353 09/08/22 0903 09/08/22 1354 09/08/22 1919  BP: (!) 91/54 Marland Kitchen)  108/57 111/66 (!) 93/55  Pulse: 63 72 (!) 44 77  Resp:  '15 14 18  '$ Temp: 98.2 F (36.8 C) 97.8 F (36.6 C) 98 F (36.7 C) 98.2 F (36.8 C)  TempSrc: Oral Oral Oral   SpO2:  98% 92% 100%  Weight:      Height:       SpO2: 100 % O2 Flow Rate (L/min): 2 L/min   Intake/Output Summary (Last 24 hours) at 09/09/2022 0736 Last data filed at 09/08/2022 1300 Gross per 24 hour  Intake 480 ml  Output --  Net  480 ml   Filed Weights   09/05/22 1250 09/05/22 1305 09/07/22 0809  Weight: 100.4 kg 100.4 kg 104.4 kg    Exam: General exam: In no acute distress. Respiratory system: Good air movement and clear to auscultation. Cardiovascular system: S1 & S2 heard, RRR. No JVD. Gastrointestinal system: Abdomen is nondistended, soft and nontender.  Left extremities: No pedal edema. Skin: Left hand is wrapped Psychiatry: Judgement and insight appear normal. Mood & affect appropriate.    Data Reviewed:    Labs: Basic Metabolic Panel: Recent Labs  Lab 09/02/22 0834 09/04/22 1024 09/05/22 0814 09/08/22 0308  NA 127* 129* 129* 131*  K 4.3 4.2 4.8 4.0  CL 88* 91* 88* 93*  CO2 '25 24 27 24  '$ GLUCOSE 112* 158* 256* 196*  BUN 75* 72* 82* 40*  CREATININE 5.95* 6.18* 6.76* 4.83*  CALCIUM 8.6* 8.4* 9.2 8.9  PHOS 5.8*  --  5.4*  --    GFR Estimated Creatinine Clearance: 15 mL/min (A) (by C-G formula based on SCr of 4.83 mg/dL (H)). Liver Function Tests: Recent Labs  Lab 09/02/22 0834 09/05/22 0814  ALBUMIN 1.9* 2.0*   No results for input(s): "LIPASE", "AMYLASE" in the last 168 hours. No results for input(s): "AMMONIA" in the last 168 hours. Coagulation profile No results for input(s): "INR", "PROTIME" in the last 168 hours. COVID-19 Labs  No results for input(s): "DDIMER", "FERRITIN", "LDH", "CRP" in the last 72 hours.  Lab Results  Component Value Date   SARSCOV2NAA NEGATIVE 10/25/2020   [place] NEGATIVE 08/30/2020   [place] Not Detected 11/17/2019   SARSCOV2NAA POSITIVE (A) 10/27/2019    CBC: Recent Labs  Lab 09/05/22 0814 09/06/22 0312 09/07/22 0829 09/08/22 0308 09/09/22 0304  WBC 15.6* 16.9* 15.2* 15.7* 17.5*  NEUTROABS  --  14.2* 12.4* 12.4* 14.9*  HGB 9.8* 9.5* 8.3* 8.2* 8.4*  HCT 30.8* 30.8* 25.6* 26.1* 26.2*  MCV 93.6 94.2 94.8 94.6 95.3  PLT 212 222 204 201 210   Cardiac Enzymes: Recent Labs  Lab 09/07/22 0437  CKTOTAL 58   BNP (last 3  results) No results for input(s): "PROBNP" in the last 8760 hours. CBG: Recent Labs  Lab 09/07/22 2045 09/08/22 0903 09/08/22 1123 09/08/22 1619 09/08/22 1920  GLUCAP 220* 236* 172* 125* 124*   D-Dimer: No results for input(s): "DDIMER" in the last 72 hours. Hgb A1c: No results for input(s): "HGBA1C" in the last 72 hours. Lipid Profile: No results for input(s): "CHOL", "HDL", "LDLCALC", "TRIG", "CHOLHDL", "LDLDIRECT" in the last 72 hours. Thyroid function studies: No results for input(s): "TSH", "T4TOTAL", "T3FREE", "THYROIDAB" in the last 72 hours.  Invalid input(s): "FREET3" Anemia work up: No results for input(s): "VITAMINB12", "FOLATE", "FERRITIN", "TIBC", "IRON", "RETICCTPCT" in the last 72 hours. Sepsis Labs: Recent Labs  Lab 09/06/22 0312 09/07/22 0829 09/08/22 0308 09/09/22 0304  WBC 16.9* 15.2* 15.7* 17.5*   Microbiology Recent Results (from the past 240 hour(s))  Aerobic/Anaerobic Culture w Gram Stain (surgical/deep wound)     Status: None   Collection Time: 08/30/22  1:43 PM   Specimen: PATH Other; Body Fluid  Result Value Ref Range Status   Specimen Description WOUND  Final   Special Requests LEFT THUMB ABSCESS SPEC A  Final   Gram Stain   Final    RARE WBC PRESENT, PREDOMINANTLY MONONUCLEAR FEW GRAM POSITIVE COCCI IN PAIRS IN CLUSTERS RARE GRAM NEGATIVE RODS    Culture   Final    MODERATE METHICILLIN RESISTANT STAPHYLOCOCCUS AUREUS MODERATE HAEMOPHILUS PARAINFLUENZAE BETA LACTAMASE NEGATIVE NO ANAEROBES ISOLATED Performed at [facility], [place] [address] [place], [place] [address]    Report Status 09/04/2022 FINAL  Final   Organism ID, Bacteria METHICILLIN RESISTANT STAPHYLOCOCCUS AUREUS  Final      Susceptibility   Methicillin resistant staphylococcus aureus - MIC*    CIPROFLOXACIN 4 RESISTANT Resistant     ERYTHROMYCIN >=8 RESISTANT Resistant     GENTAMICIN <=0.5 SENSITIVE Sensitive     OXACILLIN >=4 RESISTANT Resistant      TETRACYCLINE <=1 SENSITIVE Sensitive     VANCOMYCIN 1 SENSITIVE Sensitive     TRIMETH/SULFA <=10 SENSITIVE Sensitive     CLINDAMYCIN <=0.25 SENSITIVE Sensitive     RIFAMPIN <=0.5 SENSITIVE Sensitive     Inducible Clindamycin NEGATIVE Sensitive     * MODERATE METHICILLIN RESISTANT STAPHYLOCOCCUS AUREUS  Aerobic/Anaerobic Culture w Gram Stain (surgical/deep wound)     Status: None   Collection Time: 08/30/22  1:51 PM   Specimen: PATH Other; Tissue  Result Value Ref Range Status   Specimen Description TISSUE  Final   Special Requests LEFT THUMB BONE  Final   Gram Stain   Final    NO WBC SEEN RARE GRAM POSITIVE COCCI IN CLUSTERS    Culture   Final    MODERATE METHICILLIN RESISTANT STAPHYLOCOCCUS AUREUS FEW HAEMOPHILUS PARAINFLUENZAE BETA LACTAMASE NEGATIVE NO ANAEROBES ISOLATED Performed at [facility], [address], [place], [place] [address]    Report Status 09/04/2022 FINAL  Final   Organism ID, Bacteria METHICILLIN RESISTANT STAPHYLOCOCCUS AUREUS  Final      Susceptibility   Methicillin resistant staphylococcus aureus - MIC*    CIPROFLOXACIN 4 RESISTANT Resistant     ERYTHROMYCIN >=8 RESISTANT Resistant     GENTAMICIN <=0.5 SENSITIVE Sensitive     OXACILLIN >=4 RESISTANT Resistant     TETRACYCLINE <=1 SENSITIVE Sensitive     VANCOMYCIN <=0.5 SENSITIVE Sensitive     TRIMETH/SULFA <=10 SENSITIVE Sensitive     CLINDAMYCIN <=0.25 SENSITIVE Sensitive     RIFAMPIN <=0.5 SENSITIVE Sensitive     Inducible Clindamycin NEGATIVE Sensitive     * MODERATE METHICILLIN RESISTANT STAPHYLOCOCCUS AUREUS  Aerobic/Anaerobic Culture w Gram Stain (surgical/deep wound)     Status: None (Preliminary result)   Collection Time: 09/04/22  1:51 PM   Specimen: Soft Tissue, Other  Result Value Ref Range Status   Specimen Description TISSUE LEFT THUMB  Final   Special Requests REVISION AMPUTATION  Final   Gram Stain   Final    FEW WBC PRESENT,BOTH PMN AND MONONUCLEAR FEW GRAM POSITIVE COCCI  IN CLUSTERS RARE GRAM NEGATIVE RODS Performed at [facility], [address], [place], [place] [address]    Culture   Final    MODERATE METHICILLIN RESISTANT STAPHYLOCOCCUS AUREUS NO ANAEROBES ISOLATED; CULTURE IN PROGRESS FOR 5 DAYS    Report Status PENDING  Incomplete   Organism ID, Bacteria METHICILLIN  RESISTANT STAPHYLOCOCCUS AUREUS  Final      Susceptibility   Methicillin resistant staphylococcus aureus - MIC*    CIPROFLOXACIN >=8 RESISTANT Resistant     ERYTHROMYCIN >=8 RESISTANT Resistant     GENTAMICIN <=0.5 SENSITIVE Sensitive     OXACILLIN >=4 RESISTANT Resistant     TETRACYCLINE <=1 SENSITIVE Sensitive     VANCOMYCIN 1 SENSITIVE Sensitive     TRIMETH/SULFA <=10 SENSITIVE Sensitive     CLINDAMYCIN <=0.25 SENSITIVE Sensitive     RIFAMPIN <=0.5 SENSITIVE Sensitive     Inducible Clindamycin NEGATIVE Sensitive     * MODERATE METHICILLIN RESISTANT STAPHYLOCOCCUS AUREUS     Medications:    Chlorhexidine Gluconate Cloth  6 each Topical Q0600   [START ON 09/14/2022] darbepoetin (ARANESP) injection - DIALYSIS  150 mcg Intravenous Q Thu-HD   darbepoetin (ARANESP) injection - DIALYSIS  90 mcg Intravenous Once   furosemide  80 mg Oral BID   insulin aspart  0-24 Units Subcutaneous TID WC   insulin aspart  4 Units Subcutaneous TID WC   insulin detemir  30 Units Subcutaneous Daily   midodrine  10 mg Oral Q T,Th,Sa-HD   pantoprazole  40 mg Oral BID   rosuvastatin  10 mg Oral Daily   sevelamer carbonate  1,600 mg Oral TID WC   Continuous Infusions:  cefTAZidime (FORTAZ)  IV 2 g (09/07/22 1604)   DAPTOmycin (CUBICIN) 650 mg in sodium chloride 0.9 % IVPB 650 mg (09/07/22 2009)   DAPTOmycin (CUBICIN) 950 mg in sodium chloride 0.9 % IVPB        LOS: 10 days   Charlynne Cousins  [facility]  09/09/2022, 7:36 AM

## 2022-09-09 NOTE — Progress Notes (Signed)
[facility] Progress Note   Subjective:    Seen in dialysis-  no new issues-  dispo is pending - SNF search   Objective Vitals:   09/09/22 0813 09/09/22 0846 09/09/22 0952 09/09/22 0959  BP: (!) 110/52 101/69 (!) 110/53 100/65  Pulse: 70 73 74 71  Resp: 14 18 (!) 25 17  Temp: 97.6 F (36.4 C) 98 F (36.7 C) 98.5 F (36.9 C)   TempSrc: Oral Tympanic Axillary   SpO2:   95% 97%  Weight:   104.1 kg   Height:       Physical Exam General:chronically ill appearing male in NAD Heart:RRR, no mrg Lungs:CTAB, nml WOB on RA Abdomen:soft, NTND Extremities:1-2+ LE edema R>L and BL pedal edema. R hand/forearm dressed, loose ACE wrap covering with some blood present on underside Dialysis Access: [facility]   Filed Weights   09/05/22 1305 09/07/22 0809 09/09/22 0952  Weight: 100.4 kg 104.4 kg 104.1 kg    Intake/Output Summary (Last 24 hours) at 09/09/2022 1009 Last data filed at 09/08/2022 1300 Gross per 24 hour  Intake 240 ml  Output --  Net 240 ml    Additional Objective Labs: Basic Metabolic Panel: Recent Labs  Lab 09/04/22 1024 09/05/22 0814 09/08/22 0308  NA 129* 129* 131*  K 4.2 4.8 4.0  CL 91* 88* 93*  CO2 '24 27 24  '$ GLUCOSE 158* 256* 196*  BUN 72* 82* 40*  CREATININE 6.18* 6.76* 4.83*  CALCIUM 8.4* 9.2 8.9  PHOS  --  5.4*  --    Liver Function Tests: Recent Labs  Lab 09/05/22 0814  ALBUMIN 2.0*   No results for input(s): "LIPASE", "AMYLASE" in the last 168 hours. CBC: Recent Labs  Lab 09/05/22 0814 09/05/22 0814 09/06/22 0312 09/07/22 0829 09/08/22 0308 09/09/22 0304  WBC 15.6*  --  16.9* 15.2* 15.7* 17.5*  NEUTROABS  --    < > 14.2* 12.4* 12.4* 14.9*  HGB 9.8*  --  9.5* 8.3* 8.2* 8.4*  HCT 30.8*  --  30.8* 25.6* 26.1* 26.2*  MCV 93.6  --  94.2 94.8 94.6 95.3  PLT 212  --  222 204 201 210   < > = values in this interval not displayed.   Blood Culture    Component Value Date/Time   SDES TISSUE LEFT THUMB 09/04/2022 1351    SPECREQUEST REVISION AMPUTATION 09/04/2022 1351   CULT  09/04/2022 1351    MODERATE METHICILLIN RESISTANT STAPHYLOCOCCUS AUREUS NO ANAEROBES ISOLATED; CULTURE IN PROGRESS FOR 5 DAYS    REPTSTATUS PENDING 09/04/2022 1351    Cardiac Enzymes: Recent Labs  Lab 09/07/22 0437  CKTOTAL 58   CBG: Recent Labs  Lab 09/08/22 0903 09/08/22 1123 09/08/22 1619 09/08/22 1920 09/09/22 0809  GLUCAP 236* 172* 125* 124* 152*   Iron Studies: No results for input(s): "IRON", "TIBC", "TRANSFERRIN", "FERRITIN" in the last 72 hours. Lab Results  Component Value Date   INR 2.5 06/27/2021   INR 2.1 06/08/2021   INR 2.3 05/18/2021   Studies/Results: No results found.  Medications:  cefTAZidime (FORTAZ)  IV 2 g (09/07/22 1604)   DAPTOmycin (CUBICIN) 650 mg in sodium chloride 0.9 % IVPB 650 mg (09/07/22 2009)   DAPTOmycin (CUBICIN) 950 mg in sodium chloride 0.9 % IVPB      Chlorhexidine Gluconate Cloth  6 each Topical Q0600   [START ON 09/14/2022] darbepoetin (ARANESP) injection - DIALYSIS  150 mcg Intravenous Q Thu-HD   furosemide  80 mg Oral BID   insulin aspart  0-24 Units Subcutaneous TID WC   insulin aspart  4 Units Subcutaneous TID WC   insulin detemir  30 Units Subcutaneous Daily   midodrine  10 mg Oral Q T,Th,Sa-HD   pantoprazole  40 mg Oral BID   rosuvastatin  10 mg Oral Daily   sevelamer carbonate  1,600 mg Oral TID WC    Dialysis Orders: TTS SW 4h 450/ 500  101 kg  3K/2.5 bath  Hep none LUA AVF - last HD post HD 101.6kg on 10/7 - last Hb 10.0 10/5 - mircera 50 q 2, last 9/26 - venofer 100 mg tiw IV thru 10/19 - doxercalciferol 5 ug ttw IV  Home meds include - eliquis, furosemide 40 qd, isordil 20, midodrine 10 prn, novolin insulin, rosuvastatin, vits/ supps / prns  Assessment/Plan: ESRD- on HD TTS.  today on schedule- have been UFing quite a bit Volume overload/BP- midodrine 10 mg TTS predialysis ordered recently. Noted IV Albumin given yesterday. Tolerating UF 4L each  HD here.  +hyponatremia, improved with HD.  Remains volume overloaded, continue max UF as tolerated. Will need dry weight lowered on d/c. wife wants to continue diuretic-  does not rally make a lot of urine so I dont think its doing much but she is really wanting to continue  Osteomyelitis left hand/thumb status post left arm AV fistula ligation/ left thumb flexor sheath I&D , bone curettage and debridement of osteomyelitis of the distal phalanx on 10/11 and returned to OR on 10/16 for open debridement w/L thumb amputation thru proximal phalanx by Dr. Apolonio Schneiders (Hand ortho)- Had some bleeding yesterday - Hand surgeon notified.  ID antibiotic plan now daptomycin and Fortaz  6wk total follow-up in ID clinic on 10/04/2022 and per [facility] has adequate blood flow no angiogram needed. Dysphagia/distal esophageal narrowing (on barium swallow study )- EGD w/esophageal dilation yesterday.  Symptoms improved, now on regular diet - change to renal diet.   DM2- plan per admit team Anemia of ESRD -No iron with infection-  now dropping-  ESA recently raised. Secondary hyperparathyroidism - phos and Ca in goal. Renvela 800 increased 1600 mg.   Nutrition - Renal diet w/fluid restrictions once advanced.  Permanent A Fib - Eliquis on hold.   [facility] 09/09/2022,10:09 AM  LOS: 10 days

## 2022-09-09 NOTE — Progress Notes (Addendum)
Received patient in bed to unit.  Alert and oriented.  Informed consent signed and in chart.   Treatment initiated: 0959 Treatment completed: 0211  Patient tolerated well.  Transported back to the room  Alert, without acute distress.  Hand-off given to patient's nurse.   Access used: catheter Access issues: none  Total UF removed: 4000 Medication(s) given: heparin bolus Post HD VS: 98.2, 112/60(72), HR-72, RR-20, SP02-100 Post HD weight: 99.4kg   [facility]

## 2022-09-09 NOTE — Procedures (Signed)
Patient was seen on dialysis and the procedure was supervised.  BFR 400  Via AVF BP is  100/65.   Patient appears to be tolerating treatment well  Louis [name] 09/09/2022

## 2022-09-09 NOTE — Progress Notes (Signed)
Pt was reported coughing up thick mucus.This was not witnessed by Probation officer.  Pt did not have adventitious lung sounds.   Writer gave pt incentive spirometer and instructed on use and purpose.   Pt and wife verbalized understanding and patient performed teach-back and demonstration proper technique.

## 2022-09-10 ENCOUNTER — Encounter (HOSPITAL_COMMUNITY): Payer: Self-pay | Admitting: Gastroenterology

## 2022-09-10 DIAGNOSIS — M86042 Acute hematogenous osteomyelitis, left hand: Secondary | ICD-10-CM | POA: Diagnosis not present

## 2022-09-10 DIAGNOSIS — I482 Chronic atrial fibrillation, unspecified: Secondary | ICD-10-CM | POA: Diagnosis not present

## 2022-09-10 DIAGNOSIS — N179 Acute kidney failure, unspecified: Secondary | ICD-10-CM | POA: Diagnosis not present

## 2022-09-10 DIAGNOSIS — A480 Gas gangrene: Secondary | ICD-10-CM | POA: Diagnosis not present

## 2022-09-10 LAB — GLUCOSE, CAPILLARY
Glucose-Capillary: 143 mg/dL — ABNORMAL HIGH (ref 70–99)
Glucose-Capillary: 178 mg/dL — ABNORMAL HIGH (ref 70–99)
Glucose-Capillary: 182 mg/dL — ABNORMAL HIGH (ref 70–99)
Glucose-Capillary: 196 mg/dL — ABNORMAL HIGH (ref 70–99)

## 2022-09-10 NOTE — Progress Notes (Signed)
[facility] PROGRESS NOTE    Progress Note  LABARRON DURNIN  EGB:[id number] DOB: 1946/06/21 DOA: 08/28/2022 PCP: [name], MD     Brief Narrative:   Louis [name] is an 76 y.o. 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 ID was consulted they recommended IV daptomycin and Tressie Ellis for 6 weeks and follow-up with them in the clinic on 10/04/2022 they have signed off. He underwent debridement with left thumb excisional debridement and left thumb amputation on 09/04/2022. Reportedly profuse bleeding of the left hand surgical site on 09/07/2022.  Which has subsequently subsided. Physical therapy evaluated the patient recommended short-term rehab, patient is medically stable to discharge to skilled.  End-stage renal disease on hemodialysis: Nephrology on board for dialysis today.  Hyperlipidemia: Continue Crestor.  Diabetes mellitus type 2 with hyperglycemia: She was started on long-acting insulin sliding scale his blood glucose fairly controlled today.  Chronic atrial fibrillation: Currently on a rate controlling medication. Anticoagulation had to be held due to profuse bleeding from the left hand at the surgical site. Orthopedic surgery to dictate when to restart  anticoagulation.  Acute blood loss anemia: Hemoglobin dropped from 10 on admission to 8.4 this morning.  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  He was also found to have nonbleeding gastric ulcer with a clean base started on Protonix p.o. twice daily.  Chronic hyponatremia Asymptomatic.  Obesity (BMI 30-39.9) Noted  DVT prophylaxis: lovenox Family Communication:  Status is: Inpatient Remains inpatient appropriate because: Osteomyelitis and cellulitis    Code Status:     Code Status Orders  (From admission, onward)           Start     Ordered   08/29/22 0550  Full code  Continuous        08/29/22 0549           Code Status History     Date Active Date Inactive Code Status Order ID Comments User Context   12/14/2018 1635 12/20/2018 1337 Full Code [id number]  Lady Deutscher, MD Inpatient   02/02/2017 1415 02/03/2017 1828 Full Code [id number]  Radene Gunning, NP ED      Advance Directive Documentation    Flowsheet Row Most Recent Value  Type of Advance Directive Healthcare Power of Attorney, Living will  Pre-existing out of facility DNR order (yellow form or pink MOST form) --  "MOST" Form in Place? --         IV Access:   Peripheral IV   Procedures and diagnostic studies:   No results found.   Medical Consultants:   None.   Subjective:    [name] relates his pain is controlled.  Objective:  Vitals:   09/09/22 1433 09/09/22 1500 09/10/22 0400 09/10/22 0741  BP:  (!) 106/58 (!) 154/99 (!) 99/53  Pulse:  67  83  Resp:  '20 19 19  '$ Temp:  98 F (36.7 C) 98 F (36.7 C) 98 F (36.7 C)  TempSrc:  Oral Oral Oral  SpO2:  100% 99% 98%  Weight: 99.4 kg     Height:       SpO2: 98 % O2 Flow Rate (L/min): 2 L/min   Intake/Output Summary (Last 24 hours)  at 09/10/2022 1024 Last data filed at 09/09/2022 2000 Gross per 24 hour  Intake 240 ml  Output 4000 ml  Net -3760 ml    Filed Weights   09/07/22 0809 09/09/22 0952 09/09/22 1433  Weight: 104.4 kg 104.1 kg 99.4 kg    Exam: General exam: In no acute distress. Respiratory system: Good air movement and clear to auscultation. Cardiovascular system: S1 & S2 heard, RRR. No JVD. Gastrointestinal system: Abdomen is nondistended, soft and nontender.  Extremities: No pedal edema. Skin: No rashes, lesions or ulcers Psychiatry: Judgement and insight appear normal. Mood & affect appropriate.   Data Reviewed:    Labs: Basic Metabolic Panel: Recent Labs  Lab 09/04/22 1024 09/05/22 0814 09/08/22 0308  NA 129* 129* 131*  K 4.2 4.8 4.0  CL 91* 88* 93*  CO2 '24 27 24  '$ GLUCOSE 158* 256* 196*  BUN 72* 82* 40*  CREATININE 6.18* 6.76* 4.83*  CALCIUM 8.4* 9.2 8.9  PHOS  --  5.4*  --     GFR Estimated Creatinine Clearance: 14.6 mL/min (A) (by C-G formula based on SCr of 4.83 mg/dL (H)). Liver Function Tests: Recent Labs  Lab 09/05/22 0814  ALBUMIN 2.0*    No results for input(s): "LIPASE", "AMYLASE" in the last 168 hours. No results for input(s): "AMMONIA" in the last 168 hours. Coagulation profile No results for input(s): "INR", "PROTIME" in the last 168 hours. COVID-19 Labs  No results for input(s): "DDIMER", "FERRITIN", "LDH", "CRP" in the last 72 hours.  Lab Results  Component Value Date   SARSCOV2NAA NEGATIVE 10/25/2020   [place] NEGATIVE 08/30/2020   [place] Not Detected 11/17/2019   SARSCOV2NAA POSITIVE (A) 10/27/2019    CBC: Recent Labs  Lab 09/05/22 0814 09/06/22 0312 09/07/22 0829 09/08/22 0308 09/09/22 0304  WBC 15.6* 16.9* 15.2* 15.7* 17.5*  NEUTROABS  --  14.2* 12.4* 12.4* 14.9*  HGB 9.8* 9.5* 8.3* 8.2* 8.4*  HCT 30.8* 30.8* 25.6* 26.1* 26.2*  MCV 93.6 94.2 94.8 94.6 95.3  PLT 212 222 204 201 210    Cardiac Enzymes: Recent Labs  Lab  09/07/22 0437  CKTOTAL 58    BNP (last 3 results) No results for input(s): "PROBNP" in the last 8760 hours. CBG: Recent Labs  Lab 09/09/22 0809 09/09/22 1507 09/09/22 1828 09/09/22 1944 09/10/22 0739  GLUCAP 152* 115* 135* 146* 143*    D-Dimer: No results for input(s): "DDIMER" in the last 72 hours. Hgb A1c: No results for input(s): "HGBA1C" in the last 72 hours. Lipid Profile: No results for input(s): "CHOL", "HDL", "LDLCALC", "TRIG", "CHOLHDL", "LDLDIRECT" in the last 72 hours. Thyroid function studies: No results for input(s): "TSH", "T4TOTAL", "T3FREE", "THYROIDAB" in the last 72 hours.  Invalid input(s): "FREET3" Anemia work up: No results for input(s): "VITAMINB12", "FOLATE", "FERRITIN", "TIBC", "IRON", "RETICCTPCT" in the last 72 hours. Sepsis Labs: Recent Labs  Lab 09/06/22 0312 09/07/22 0829 09/08/22 0308 09/09/22 0304  WBC 16.9* 15.2* 15.7* 17.5*    Microbiology Recent  Results (from the past 240 hour(s))  Aerobic/Anaerobic Culture w Gram Stain (surgical/deep wound)     Status: None   Collection Time: 09/04/22  1:51 PM   Specimen: Soft Tissue, Other  Result Value Ref Range Status   Specimen Description TISSUE LEFT THUMB  Final   Special Requests REVISION AMPUTATION  Final   Gram Stain   Final    FEW WBC PRESENT,BOTH PMN AND MONONUCLEAR FEW GRAM POSITIVE COCCI IN CLUSTERS RARE GRAM NEGATIVE RODS    Culture   Final    MODERATE METHICILLIN RESISTANT STAPHYLOCOCCUS AUREUS NO ANAEROBES ISOLATED Performed at [facility], [place] [address] [place], [place] [address]    Report Status 09/09/2022 FINAL  Final   Organism ID, Bacteria METHICILLIN RESISTANT STAPHYLOCOCCUS AUREUS  Final      Susceptibility   Methicillin resistant staphylococcus aureus - MIC*    CIPROFLOXACIN >=8 RESISTANT Resistant     ERYTHROMYCIN >=8 RESISTANT Resistant     GENTAMICIN <=0.5 SENSITIVE Sensitive     OXACILLIN >=4 RESISTANT Resistant     TETRACYCLINE <=1 SENSITIVE  Sensitive     VANCOMYCIN 1 SENSITIVE Sensitive     TRIMETH/SULFA <=10 SENSITIVE Sensitive     CLINDAMYCIN <=0.25 SENSITIVE Sensitive     RIFAMPIN <=0.5 SENSITIVE Sensitive     Inducible Clindamycin NEGATIVE Sensitive     * MODERATE METHICILLIN RESISTANT STAPHYLOCOCCUS AUREUS     Medications:    Chlorhexidine Gluconate Cloth  6 each Topical Q0600   [START ON 09/14/2022] darbepoetin (ARANESP) injection - DIALYSIS  150 mcg Intravenous Q Thu-HD   furosemide  80 mg Oral BID   insulin aspart  0-24 Units Subcutaneous TID WC   insulin aspart  4 Units Subcutaneous TID WC   insulin detemir  30 Units Subcutaneous Daily   midodrine  10 mg Oral Q T,Th,Sa-HD   pantoprazole  40 mg Oral BID   rosuvastatin  10 mg Oral Daily   sevelamer carbonate  1,600 mg Oral TID WC   Continuous Infusions:  cefTAZidime (FORTAZ)  IV 2 g (09/09/22 1557)   DAPTOmycin (CUBICIN) 650 mg in sodium chloride 0.9 % IVPB 650 mg (09/07/22 2009)   DAPTOmycin (CUBICIN) 950 mg in sodium chloride 0.9 % IVPB 950 mg (09/09/22 2007)      LOS: 11 days   Charlynne Cousins  [facility]  09/10/2022, 10:24 AM

## 2022-09-10 NOTE — Progress Notes (Addendum)
[facility] Progress Note   Subjective:    Seen and examined patient at bedside. Tolerated yesterday's HD with net UF 4L. No acute complaints. Next HD 10/24. Awaiting SNF placement.  Wife says they are ready to go.  Possibilities include [facility] where his dialysis days would have changed to Monday Wednesday Friday versus [facility] where he would have to go to a different clinic  Objective Vitals:   09/09/22 1433 09/09/22 1500 09/10/22 0400 09/10/22 0741  BP:  (!) 106/58 (!) 154/99 (!) 99/53  Pulse:  67  83  Resp:  '20 19 19  '$ Temp:  98 F (36.7 C) 98 F (36.7 C) 98 F (36.7 C)  TempSrc:  Oral Oral Oral  SpO2:  100% 99% 98%  Weight: 99.4 kg     Height:       Physical Exam General:chronically ill appearing male in NAD Heart:RRR, no mrg Lungs:CTAB, nml WOB on RA Abdomen:soft, NTND Extremities:1-2+ LE edema R>L and BL pedal edema. R hand/forearm dressed, loose ACE wrap covering with some blood present on underside Dialysis Access: [facility]   Filed Weights   09/07/22 0809 09/09/22 0952 09/09/22 1433  Weight: 104.4 kg 104.1 kg 99.4 kg    Intake/Output Summary (Last 24 hours) at 09/10/2022 0919 Last data filed at 09/09/2022 2000 Gross per 24 hour  Intake 240 ml  Output 4000 ml  Net -3760 ml    Additional Objective Labs: Basic Metabolic Panel: Recent Labs  Lab 09/04/22 1024 09/05/22 0814 09/08/22 0308  NA 129* 129* 131*  K 4.2 4.8 4.0  CL 91* 88* 93*  CO2 '24 27 24  '$ GLUCOSE 158* 256* 196*  BUN 72* 82* 40*  CREATININE 6.18* 6.76* 4.83*  CALCIUM 8.4* 9.2 8.9  PHOS  --  5.4*  --    Liver Function Tests: Recent Labs  Lab 09/05/22 0814  ALBUMIN 2.0*   No results for input(s): "LIPASE", "AMYLASE" in the last 168 hours. CBC: Recent Labs  Lab 09/05/22 0814 09/05/22 0814 09/06/22 0312 09/07/22 0829 09/08/22 0308 09/09/22 0304  WBC 15.6*  --  16.9* 15.2* 15.7* 17.5*  NEUTROABS  --    < > 14.2* 12.4* 12.4* 14.9*  HGB 9.8*  --  9.5* 8.3*  8.2* 8.4*  HCT 30.8*  --  30.8* 25.6* 26.1* 26.2*  MCV 93.6  --  94.2 94.8 94.6 95.3  PLT 212  --  222 204 201 210   < > = values in this interval not displayed.   Blood Culture    Component Value Date/Time   SDES TISSUE LEFT THUMB 09/04/2022 1351   SPECREQUEST REVISION AMPUTATION 09/04/2022 1351   CULT  09/04/2022 1351    MODERATE METHICILLIN RESISTANT STAPHYLOCOCCUS AUREUS NO ANAEROBES ISOLATED Performed at [facility], [place] [address], [place], [place] [address]    REPTSTATUS 09/09/2022 FINAL 09/04/2022 1351    Cardiac Enzymes: Recent Labs  Lab 09/07/22 0437  CKTOTAL 58   CBG: Recent Labs  Lab 09/09/22 0809 09/09/22 1507 09/09/22 1828 09/09/22 1944 09/10/22 0739  GLUCAP 152* 115* 135* 146* 143*   Iron Studies: No results for input(s): "IRON", "TIBC", "TRANSFERRIN", "FERRITIN" in the last 72 hours. Lab Results  Component Value Date   INR 2.5 06/27/2021   INR 2.1 06/08/2021   INR 2.3 05/18/2021   Studies/Results: No results found.  Medications:  cefTAZidime (FORTAZ)  IV 2 g (09/09/22 1557)   DAPTOmycin (CUBICIN) 650 mg in sodium chloride 0.9 % IVPB 650 mg (09/07/22 2009)  DAPTOmycin (CUBICIN) 950 mg in sodium chloride 0.9 % IVPB 950 mg (09/09/22 2007)    Chlorhexidine Gluconate Cloth  6 each Topical Q0600   [START ON 09/14/2022] darbepoetin (ARANESP) injection - DIALYSIS  150 mcg Intravenous Q Thu-HD   furosemide  80 mg Oral BID   insulin aspart  0-24 Units Subcutaneous TID WC   insulin aspart  4 Units Subcutaneous TID WC   insulin detemir  30 Units Subcutaneous Daily   midodrine  10 mg Oral Q T,Th,Sa-HD   pantoprazole  40 mg Oral BID   rosuvastatin  10 mg Oral Daily   sevelamer carbonate  1,600 mg Oral TID WC    Dialysis Orders: TTS SW 4h 450/ 500  101 kg  3K/2.5 bath  Hep none LUA AVF - last HD post HD 101.6kg on 10/7 - last Hb 10.0 10/5 - mircera 50 q 2, last 9/26 - venofer 100 mg tiw IV thru 10/19 - doxercalciferol 5 ug ttw IV   Home  meds include - eliquis, furosemide 40 qd, isordil 20, midodrine 10 prn, novolin insulin, rosuvastatin, vits/ supps / prns   Assessment/Plan: ESRD- on HD TTS. Have been UFing quite a bit-removed 4L yesterday. Next HD 10/24.  Now, there is a possibility that his dialysis days will need to change to Monday Wednesday Friday-we will just keep that in mind Volume overload/BP- midodrine 10 mg TTS predialysis ordered recently. Noted IV Albumin given. Tolerating UF 4L each HD here.  +hyponatremia, improved with HD.  Remains volume overloaded, continue max UF as tolerated. Will need dry weight lowered on d/c. wife wants to continue diuretic-  does not rally make a lot of urine so I dont think its doing much but she is really wanting to continue  Osteomyelitis left hand/thumb status post left arm AV fistula ligation/ left thumb flexor sheath I&D , bone curettage and debridement of osteomyelitis of the distal phalanx on 10/11 and returned to OR on 10/16 for open debridement w/L thumb amputation thru proximal phalanx by Dr. Apolonio Schneiders (Hand ortho)- Had some bleeding yesterday - Hand surgeon notified.  ID antibiotic plan now daptomycin and Fortaz  6wk total follow-up in ID clinic on 10/04/2022 and per [facility] has adequate blood flow no angiogram needed. Dysphagia/distal esophageal narrowing (on barium swallow study )- EGD w/esophageal dilation yesterday.  Symptoms improved, now on regular diet - change to renal diet.   DM2- plan per admit team Anemia of ESRD -No iron with infection-  now dropping-  Hgb 8.4 today. ESA recently raised.  Secondary hyperparathyroidism - phos and Ca in goal. Renvela 800 increased 1600 mg.   Nutrition - Renal diet w/fluid restrictions once advanced.  Permanent A Fib - Eliquis on hold.  Dispo: Awaiting SNF placement. Reviewed renal navigator's note from 10/20: appears HD schedule at [facility] may need to be changed to MWF 2nd shift. Family is disappointed about this. Still awaiting the final plan. Ongoing  discussion with renal navigator next week.  Louis Poet, NP [facility] 09/10/2022,9:19 AM  LOS: 11 days   Patient seen and examined, agree with above note with above modifications.  Patient fairly stable and medically ready for discharge.  As above, the options include going to [facility] where he would stay at [place] but his days would be to change to Monday Wednesday Friday.  He also may go to [facility] where he would need to go to a different dialysis unit.  Disposition plan is pending at this point.  I  imagine we will know more tomorrow [name], MD 09/10/2022

## 2022-09-11 DIAGNOSIS — A480 Gas gangrene: Secondary | ICD-10-CM | POA: Diagnosis not present

## 2022-09-11 DIAGNOSIS — M86042 Acute hematogenous osteomyelitis, left hand: Secondary | ICD-10-CM | POA: Diagnosis not present

## 2022-09-11 DIAGNOSIS — N179 Acute kidney failure, unspecified: Secondary | ICD-10-CM | POA: Diagnosis not present

## 2022-09-11 DIAGNOSIS — I482 Chronic atrial fibrillation, unspecified: Secondary | ICD-10-CM | POA: Diagnosis not present

## 2022-09-11 LAB — GLUCOSE, CAPILLARY
Glucose-Capillary: 130 mg/dL — ABNORMAL HIGH (ref 70–99)
Glucose-Capillary: 156 mg/dL — ABNORMAL HIGH (ref 70–99)
Glucose-Capillary: 150 mg/dL — ABNORMAL HIGH (ref 70–99)
Glucose-Capillary: 148 mg/dL — ABNORMAL HIGH (ref 70–99)

## 2022-09-11 MED ORDER — HYDROCODONE-ACETAMINOPHEN 7.5-325 MG PO TABS
1.0000 | ORAL_TABLET | Freq: Four times a day (QID) | ORAL | Status: DC | PRN
  Administered 2022-09-11 – 2022-09-13 (×5): 1 via ORAL
  Filled 2022-09-11 (×7): qty 1

## 2022-09-11 MED ORDER — CHLORHEXIDINE GLUCONATE CLOTH 2 % EX PADS
6.0000 | MEDICATED_PAD | Freq: Every day | CUTANEOUS | Status: DC
  Administered 2022-09-12: 6 via TOPICAL

## 2022-09-11 NOTE — Plan of Care (Signed)
  Problem: Health Behavior/Discharge Planning: Goal: Ability to manage health-related needs will improve Outcome: Progressing   Problem: Clinical Measurements: Goal: Will remain free from infection Outcome: Progressing   Problem: Activity: Goal: Risk for activity intolerance will decrease Outcome: Progressing   Problem: Nutrition: Goal: Adequate nutrition will be maintained Outcome: Progressing   

## 2022-09-11 NOTE — Progress Notes (Addendum)
[facility] Progress Note   Subjective:   Patient seen and examined at bedside.  Reports 5/10 pain in R hand.  Waiting to talk to surgeon about care after discharge.  Reports dialysis is going well. Denies CP, SOB, abdominal pain and n/v/d.  They have decided they would rather go to [facility] and change dialysis center temporarily to help reserve their current day/time at [facility].   Objective Vitals:   09/09/22 1500 09/10/22 0400 09/10/22 0741 09/11/22 0824  BP: (!) 106/58 (!) 154/99 (!) 99/53 110/69  Pulse: 67  83 73  Resp: '20 19 19 20  '$ Temp: 98 F (36.7 C) 98 F (36.7 C) 98 F (36.7 C) 98 F (36.7 C)  TempSrc: Oral Oral Oral Oral  SpO2: 100% 99% 98% 97%  Weight:      Height:       Physical Exam General:WDWN male in NAD Heart:IRIR, no mrg Lungs:+rhonchi, nml WOB on RA Abdomen:soft, NTND Extremities:L hand dressed, 1-2+ edema b/l LE R>L Dialysis Access: [facility]   Filed Weights   09/07/22 0809 09/09/22 0952 09/09/22 1433  Weight: 104.4 kg 104.1 kg 99.4 kg    Intake/Output Summary (Last 24 hours) at 09/11/2022 1258 Last data filed at 09/11/2022 0824 Gross per 24 hour  Intake 480 ml  Output --  Net 480 ml    Additional Objective Labs: Basic Metabolic Panel: Recent Labs  Lab 09/05/22 0814 09/08/22 0308  NA 129* 131*  K 4.8 4.0  CL 88* 93*  CO2 27 24  GLUCOSE 256* 196*  BUN 82* 40*  CREATININE 6.76* 4.83*  CALCIUM 9.2 8.9  PHOS 5.4*  --    Liver Function Tests: Recent Labs  Lab 09/05/22 0814  ALBUMIN 2.0*   CBC: Recent Labs  Lab 09/05/22 0814 09/05/22 0814 09/06/22 0312 09/07/22 0829 09/08/22 0308 09/09/22 0304  WBC 15.6*  --  16.9* 15.2* 15.7* 17.5*  NEUTROABS  --    < > 14.2* 12.4* 12.4* 14.9*  HGB 9.8*  --  9.5* 8.3* 8.2* 8.4*  HCT 30.8*  --  30.8* 25.6* 26.1* 26.2*  MCV 93.6  --  94.2 94.8 94.6 95.3  PLT 212  --  222 204 201 210   < > = values in this interval not displayed.   Blood Culture    Component Value Date/Time   SDES  TISSUE LEFT THUMB 09/04/2022 1351   SPECREQUEST REVISION AMPUTATION 09/04/2022 1351   CULT  09/04/2022 1351    MODERATE METHICILLIN RESISTANT STAPHYLOCOCCUS AUREUS NO ANAEROBES ISOLATED Performed at [facility], [place] [address] [place], [place] [address]    REPTSTATUS 09/09/2022 FINAL 09/04/2022 1351    Cardiac Enzymes: Recent Labs  Lab 09/07/22 0437  CKTOTAL 58   CBG: Recent Labs  Lab 09/10/22 1227 09/10/22 1635 09/10/22 1918 09/11/22 0802 09/11/22 1240  GLUCAP 178* 182* 196* 130* 156*   Medications:  cefTAZidime (FORTAZ)  IV 2 g (09/09/22 1557)   DAPTOmycin (CUBICIN) 650 mg in sodium chloride 0.9 % IVPB 650 mg (09/07/22 2009)   DAPTOmycin (CUBICIN) 950 mg in sodium chloride 0.9 % IVPB 950 mg (09/09/22 2007)    Chlorhexidine Gluconate Cloth  6 each Topical Q0600   [START ON 09/14/2022] darbepoetin (ARANESP) injection - DIALYSIS  150 mcg Intravenous Q Thu-HD   furosemide  80 mg Oral BID   insulin aspart  0-24 Units Subcutaneous TID WC   insulin aspart  4 Units Subcutaneous TID WC   insulin detemir  30 Units Subcutaneous Daily  midodrine  10 mg Oral Q T,Th,Sa-HD   pantoprazole  40 mg Oral BID   rosuvastatin  10 mg Oral Daily   sevelamer carbonate  1,600 mg Oral TID WC    Dialysis Orders: TTS SW 4h 450/ 500  101 kg  3K/2.5 bath  Hep none LUA AVF - last HD post HD 101.6kg on 10/7 - last Hb 10.0 10/5 - mircera 50 q 2, last 9/26 - venofer 100 mg tiw IV thru 10/19 - doxercalciferol 5 ug ttw IV   Home meds include - eliquis, furosemide 40 qd, isordil 20, midodrine 10 prn, novolin insulin, rosuvastatin, vits/ supps / prns   Assessment/Plan: ESRD- on HD TTS. Have been UFing quite a bit-removed 4L yesterday. Next HD 10/24.  Looks like they will change HD centers.  Still waiting for new HD center.   Volume overload/BP- midodrine 10 mg TTS predialysis ordered recently. . Tolerating UF 4L each HD here.  +hyponatremia, improved with HD.  Remains volume overloaded,  continue max UF as tolerated. Will need dry weight lowered on d/c. wife wants to continue diuretic-  does not really make a lot of urine so I dont think its doing much but she is really wanting to continue  Osteomyelitis left hand/thumb status post left arm AV fistula ligation/ left thumb flexor sheath I&D , bone curettage and debridement of osteomyelitis of the distal phalanx on 10/11 and returned to OR on 10/16 for open debridement w/L thumb amputation thru proximal phalanx by Dr. Apolonio Schneiders (Hand ortho). Hand surgeon notified.  ID antibiotic plan now daptomycin and Fortaz  6wk total follow-up in ID clinic on 10/04/2022 and per [facility]VS has adequate blood flow no angiogram needed.  Need to ensure temp HD clinic has ABX available prior to discharge.  Dysphagia/distal esophageal narrowing (on barium swallow study )- EGD w/esophageal dilation yesterday.  Symptoms improved, now on renal diet.  DM2- plan per admit team Anemia of ESRD -No iron with infection- last Hgb 8.4 today. ESA recently raised to 182mg qwk.  Secondary hyperparathyroidism - phos and Ca in goal. Renvela 800 increased 1600 mg.   Nutrition - Renal/carb modified diet w/fluid restrictions once advanced.  Permanent A Fib - Eliquis on hold.  Dispo: Awaiting SNF placement. [facility] at [facility]. Renal navigator waiting to see where patient will be accepted.  Need to ensure HD center has daptomycin available prior to discharge.   LJen Mow PA-C [facility] 09/11/2022,12:58 PM  LOS: 12 days   Seen and examined independently.  Agree with note and exam as documented above by physician extender and as noted here.  General adult male in bed in no acute distress HEENT normocephalic atraumatic extraocular movements intact sclera anicteric Neck supple trachea midline Lungs clear to auscultation bilaterally  normal work of breathing at rest  Heart S1S2 no rub Abdomen soft nontender nondistended Extremities no edema  Psych normal mood and affect Access left IJ tunn catheter  ESRD - awaiting final plans for HD - temporarily getting a new HD center while at rehab   Osteomyelitis - abx per primary team and ID.  Will need dapto.  s/p ligation of AVF  Dysphagia - improved and on renal diet  Dispo - needs an outpatient HD unit.  Appreciate HD SW.  is not able to be discharged without same   LClaudia Desanctis MD 09/11/2022  3:55 PM

## 2022-09-11 NOTE — Progress Notes (Signed)
Case discussed with CSW this morning. Pt may d/c tomorrow. Awaiting response from [facility] admissions regarding new clinic placement due to snf placement. Pt's case currently under review. Will await final determination.   Melven Sartorius Renal Navigator [phone]

## 2022-09-11 NOTE — Progress Notes (Signed)
[facility] PROGRESS NOTE    Progress Note  Louis [name]  NAT:[id number] DOB: 22-May-1946 DOA: 08/28/2022 PCP: [name], MD     Brief Narrative:   Louis [name] is an 76 y.o. 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 ID was consulted they recommended IV daptomycin and Tressie Ellis for 6 weeks and follow-up with them in the clinic on 10/04/2022 they have signed off. He underwent debridement with left thumb excisional debridement and left thumb amputation on 09/04/2022. Reportedly profuse bleeding of the left hand surgical site on 09/07/2022.  Which has subsequently subsided. Physical therapy evaluated the patient recommended short-term rehab. Patient is medically stable to discharge to skilled. Orthopedic surgery to dictate when to restart anticoagulation  End-stage renal disease on hemodialysis: Nephrology on board for dialysis today.  Hyperlipidemia: Continue Crestor.  Diabetes mellitus type 2 with hyperglycemia: She was started on long-acting insulin sliding scale his blood glucose fairly controlled today.  Chronic atrial fibrillation: Currently on a rate controlling medication. Anticoagulation had to be held due to profuse bleeding from the left hand at the  surgical site. Orthopedic surgery to dictate when to restart anticoagulation.  Acute blood loss anemia: Hemoglobin dropped from 10 on admission to 8.4 this morning.  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  He was also found to have nonbleeding gastric ulcer with a clean base started on Protonix p.o. twice daily.  Chronic hyponatremia Asymptomatic.  Obesity (BMI 30-39.9) Noted  DVT prophylaxis: lovenox Family Communication:  Status is: Inpatient Remains inpatient appropriate because: Osteomyelitis and cellulitis    Code Status:     Code Status Orders  (From admission, onward)           Start     Ordered   08/29/22 0550  Full code  Continuous        08/29/22 0549           Code Status History     Date Active Date Inactive Code Status Order ID Comments User Context   12/14/2018 1635 12/20/2018 1337 Full Code [id number]  Lady Deutscher, MD Inpatient   02/02/2017 1415 02/03/2017 1828 Full Code [id number]  Radene Gunning, NP ED      Advance Directive Documentation    Flowsheet Row Most Recent Value  Type of Advance Directive Healthcare Power of Attorney, Living will  Pre-existing out of facility DNR order (yellow form or pink MOST form) --  "MOST" Form in Place? --         IV Access:   Peripheral IV   Procedures and diagnostic studies:   No results found.   Medical Consultants:   None.   Subjective:    Louis [name]  chest pain is controlled.  Objective:    Vitals:   09/09/22 1433 09/09/22 1500 09/10/22 0400 09/10/22 0741  BP:  (!) 106/58 (!) 154/99 (!) 99/53  Pulse:  67  83  Resp:  '20 19 19  '$ Temp:  98 F (36.7 C) 98 F (36.7 C) 98 F (36.7 C)  TempSrc:  Oral Oral Oral  SpO2:  100% 99% 98%  Weight: 99.4 kg     Height:       SpO2: 98 % O2 Flow Rate  (L/min): 2 L/min   Intake/Output Summary (Last 24 hours) at 09/11/2022 0733 Last data filed at 09/10/2022 1500 Gross per 24 hour  Intake 480 ml  Output --  Net 480 ml    Filed Weights   09/07/22 0809 09/09/22 0952 09/09/22 1433  Weight: 104.4 kg 104.1 kg 99.4 kg    Exam: General exam: In no acute distress. Respiratory system: Good air movement and clear to auscultation. Cardiovascular system: S1 & S2 heard, RRR. No JVD. Gastrointestinal system: Abdomen is nondistended, soft and nontender.  Extremities: No pedal edema. Skin: No rashes, lesions or ulcers Psychiatry: Judgement and insight appear normal. Mood & affect appropriate.  Data Reviewed:    Labs: Basic Metabolic Panel: Recent Labs  Lab 09/04/22 1024 09/05/22 0814 09/08/22 0308  NA 129* 129* 131*  K 4.2 4.8 4.0  CL 91* 88* 93*  CO2 '24 27 24  '$ GLUCOSE 158* 256* 196*  BUN 72* 82* 40*  CREATININE 6.18* 6.76* 4.83*  CALCIUM 8.4* 9.2 8.9  PHOS  --  5.4*  --     GFR Estimated Creatinine Clearance: 14.6 mL/min (A) (by C-G formula based on SCr of 4.83 mg/dL (H)). Liver Function Tests: Recent Labs  Lab 09/05/22 0814  ALBUMIN 2.0*    No results for input(s): "LIPASE", "AMYLASE" in the last 168 hours. No results for input(s): "AMMONIA" in the last 168 hours. Coagulation profile No results for input(s): "INR", "PROTIME" in the last 168 hours. COVID-19 Labs  No results for input(s): "DDIMER", "FERRITIN", "LDH", "CRP" in the last 72 hours.  Lab Results  Component Value Date   SARSCOV2NAA NEGATIVE 10/25/2020   [place] NEGATIVE 08/30/2020   [place] Not Detected 11/17/2019   SARSCOV2NAA POSITIVE (A) 10/27/2019    CBC: Recent Labs  Lab 09/05/22 0814 09/06/22 0312 09/07/22 0829 09/08/22 0308 09/09/22 0304  WBC 15.6* 16.9* 15.2* 15.7* 17.5*  NEUTROABS  --  14.2* 12.4* 12.4* 14.9*  HGB 9.8* 9.5* 8.3* 8.2* 8.4*  HCT 30.8* 30.8* 25.6* 26.1* 26.2*  MCV 93.6 94.2 94.8 94.6 95.3  PLT 212 222 204  201 210    Cardiac Enzymes: Recent Labs  Lab 09/07/22 0437  CKTOTAL 58    BNP (last 3 results) No results for input(s): "PROBNP" in the last 8760 hours. CBG: Recent Labs  Lab 09/09/22 1944 09/10/22 0739 09/10/22 1227 09/10/22 1635 09/10/22 1918  GLUCAP 146* 143* 178* 182* 196*    D-Dimer: No results for input(s): "DDIMER" in the last 72 hours. Hgb A1c: No results for input(s): "HGBA1C" in the last 72 hours. Lipid Profile: No results for input(s): "CHOL", "HDL", "LDLCALC", "TRIG", "CHOLHDL", "LDLDIRECT" in the last 72 hours. Thyroid function studies: No results for input(s): "TSH", "T4TOTAL", "T3FREE", "THYROIDAB" in the last 72 hours.  Invalid input(s): "FREET3" Anemia work up: No results for input(s): "VITAMINB12", "FOLATE", "FERRITIN", "TIBC", "IRON", "RETICCTPCT" in the last 72 hours. Sepsis Labs: Recent Labs  Lab 09/06/22 0312 09/07/22 0829 09/08/22 0308 09/09/22 0304  WBC 16.9* 15.2*  15.7* 17.5*    Microbiology Recent Results (from the past 240 hour(s))  Aerobic/Anaerobic Culture w Gram Stain (surgical/deep wound)     Status: None   Collection Time: 09/04/22  1:51 PM   Specimen: Soft Tissue, Other  Result Value Ref Range Status   Specimen Description TISSUE LEFT THUMB  Final   Special Requests REVISION AMPUTATION  Final   Gram Stain   Final    FEW WBC PRESENT,BOTH PMN AND MONONUCLEAR FEW GRAM POSITIVE COCCI IN CLUSTERS RARE GRAM NEGATIVE RODS    Culture   Final    MODERATE METHICILLIN RESISTANT STAPHYLOCOCCUS AUREUS NO ANAEROBES ISOLATED Performed at [facility], [place] [address], [place], [place] [address]    Report Status 09/09/2022 FINAL  Final   Organism ID, Bacteria METHICILLIN RESISTANT STAPHYLOCOCCUS AUREUS  Final      Susceptibility   Methicillin resistant staphylococcus aureus - MIC*    CIPROFLOXACIN >=8 RESISTANT Resistant     ERYTHROMYCIN >=8 RESISTANT Resistant     GENTAMICIN <=0.5 SENSITIVE Sensitive     OXACILLIN >=4  RESISTANT Resistant     TETRACYCLINE <=1 SENSITIVE Sensitive     VANCOMYCIN 1 SENSITIVE Sensitive     TRIMETH/SULFA <=10 SENSITIVE Sensitive     CLINDAMYCIN <=0.25 SENSITIVE Sensitive     RIFAMPIN <=0.5 SENSITIVE Sensitive     Inducible Clindamycin NEGATIVE Sensitive     * MODERATE METHICILLIN RESISTANT STAPHYLOCOCCUS AUREUS     Medications:    Chlorhexidine Gluconate Cloth  6 each Topical Q0600   [START ON 09/14/2022] darbepoetin (ARANESP) injection - DIALYSIS  150 mcg Intravenous Q Thu-HD   furosemide  80 mg Oral BID   insulin aspart  0-24 Units Subcutaneous TID WC   insulin aspart  4 Units Subcutaneous TID WC   insulin detemir  30 Units Subcutaneous Daily   midodrine  10 mg Oral Q T,Th,Sa-HD   pantoprazole  40 mg Oral BID   rosuvastatin  10 mg Oral Daily   sevelamer carbonate  1,600 mg Oral TID WC   Continuous Infusions:  cefTAZidime (FORTAZ)  IV 2 g (09/09/22 1557)   DAPTOmycin (CUBICIN) 650 mg in sodium chloride 0.9 % IVPB 650 mg (09/07/22 2009)   DAPTOmycin (CUBICIN) 950 mg in sodium chloride 0.9 % IVPB 950 mg (09/09/22 2007)      LOS: 12 days   Charlynne Cousins  [facility]  09/11/2022, 7:33 AM

## 2022-09-11 NOTE — Progress Notes (Signed)
Physical Therapy Treatment Patient Details Name: Louis [name] MRN: [id number] DOB: 03/08/1946 Today's Date: 09/11/2022   History of Present Illness Patient is a 76 y/o male who presents on 10/9 with drainage, pain and swelling of left hand/thumb. 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 PMH includes CAD,. CHF, DM, ESRD on HD, HTN, paroxysmal A-fib, macular degeneration.    PT Comments    Pt progressing towards his physical therapy goals and is agreeable to participate. Donned R AFO and left shoe prior to mobilization attempt. Pt ambulating a total of ~20 ft with left platform RW and seated rest break in between bouts. Requiring up to two person moderate assist for functional mobility. Demonstrates generalized weakness, impaired standing balance, decreased functional use of L hand, and decreased endurance. Continue to recommend SNF for ongoing Physical Therapy.      Recommendations for follow up therapy are one component of a multi-disciplinary discharge planning process, led by the attending physician.  Recommendations may be updated based on patient status, additional functional criteria and insurance authorization.  Follow Up Recommendations  Skilled nursing-short term rehab (<3 hours/day) Can patient physically be transported by private vehicle: No   Assistance Recommended at Discharge Frequent or constant Supervision/Assistance  Patient can return home with the following Two people to help with walking and/or transfers;A lot of help with bathing/dressing/bathroom;Assistance with cooking/housework;Assist for transportation   Equipment Recommendations  Other (comment) (Left platform RW)    Recommendations for Other Services       Precautions / Restrictions Precautions Precautions: Fall Required Braces or Orthoses: Other Brace Other Brace: R  AFO Restrictions Weight Bearing Restrictions: Yes Other Position/Activity Restrictions: assume NWB on L hand     Mobility  Bed Mobility Overal bed mobility: Needs Assistance Bed Mobility: Supine to Sit     Supine to sit: Mod assist     General bed mobility comments: Use of bed pad to scoot hips out to edge of bed    Transfers Overall transfer level: Needs assistance Equipment used: Left platform walker Transfers: Sit to/from Stand Sit to Stand: Mod assist, +2 physical assistance, From elevated surface           General transfer comment: ModA + 2 to power up to stand from edge of bed, recliner and BSC. Cues for "nose over toes," rocking to gain momentum, and anterior weight shift, hand placement.    Ambulation/Gait Ambulation/Gait assistance: Min assist, +2 safety/equipment Gait Distance (Feet): 20 Feet (5", 5", 10") Assistive device: Left platform walker Gait Pattern/deviations: Step-to pattern, Step-through pattern, Decreased step length - right, Decreased step length - left, Narrow base of support Gait velocity: decreased Gait velocity interpretation: <1.8 ft/sec, indicate of risk for recurrent falls   General Gait Details: Cues for wider BOS, stepping initiation, sequencing/technique. chair follow for safety as pt fatigues quickly   Stairs             Wheelchair Mobility    Modified Rankin (Stroke Patients Only)       Balance Overall balance assessment: Needs assistance Sitting-balance support: Feet supported, No upper extremity supported Sitting balance-Leahy Scale: Fair     Standing balance support: During functional activity, Reliant on assistive device for balance Standing balance-Leahy Scale: Poor Standing balance comment: reliant on LPFRW  Cognition Arousal/Alertness: Awake/alert Behavior During Therapy: WFL for tasks assessed/performed Overall Cognitive Status: Impaired/Different from baseline Area  of Impairment: Problem solving                             Problem Solving: Slow processing, Decreased initiation, Difficulty sequencing, Requires verbal cues, Requires tactile cues General Comments: flat affect, seems to require increased time to process tasks        Exercises      General Comments        Pertinent Vitals/Pain Pain Assessment Pain Assessment: Faces Faces Pain Scale: Hurts a little bit Pain Location: L hand Pain Descriptors / Indicators: Sore, Discomfort, Operative site guarding Pain Intervention(s): Monitored during session    Home Living                          Prior Function            PT Goals (current goals can now be found in the care plan section) Acute Rehab PT Goals Patient Stated Goal: to be able to go home after this Potential to Achieve Goals: Good Progress towards PT goals: Progressing toward goals    Frequency    Min 3X/week      PT Plan Current plan remains appropriate    Co-evaluation              AM-PAC PT "6 Clicks" Mobility   Outcome Measure  Help needed turning from your back to your side while in a flat bed without using bedrails?: A Little Help needed moving from lying on your back to sitting on the side of a flat bed without using bedrails?: A Lot Help needed moving to and from a bed to a chair (including a wheelchair)?: Total Help needed standing up from a chair using your arms (e.g., wheelchair or bedside chair)?: Total Help needed to walk in hospital room?: Total Help needed climbing 3-5 steps with a railing? : Total 6 Click Score: 9    End of Session Equipment Utilized During Treatment: Gait belt;Other (comment) (R AFO) Activity Tolerance: Patient tolerated treatment well Patient left: in chair;with call bell/phone within reach;with chair alarm set;with family/visitor present Nurse Communication: Mobility status PT Visit Diagnosis: Muscle weakness (generalized) (M62.81);Difficulty  in walking, not elsewhere classified (R26.2);Unsteadiness on feet (R26.81)     Time: 7622-6333 PT Time Calculation (min) (ACUTE ONLY): 38 min  Charges:  $Gait Training: 8-22 mins $Therapeutic Activity: 23-37 mins                     Wyona Almas, PT, DPT Acute Rehabilitation Services Office [phone]    Deno Etienne 09/11/2022, 1:32 PM

## 2022-09-11 NOTE — Care Management Important Message (Signed)
Important Message  Patient Details  Name: Louis [name] MRN: [id number] Date of Birth: 21-Jan-1946   Medicare Important Message Given:  Yes     Hannah Beat 09/11/2022, 2:18 PM

## 2022-09-11 NOTE — Progress Notes (Signed)
Subjective: 6 Days Post-Op Procedure(s) (LRB): ESOPHAGOGASTRODUODENOSCOPY (EGD) WITH PROPOFOL (N/A) BIOPSY SAVORY DILATION (N/A) Patient reports pain as 5 on 0-10 scale.   He is comfortable in bed and has been tolerating the pain in the left thumb. He does note numbness, swelling, and stiffness.  He has been compliant with the bandage.   Objective: Vital signs in last 24 hours: Temp:  [98 F (36.7 C)] 98 F (36.7 C) (10/23 0824) Pulse Rate:  [73] 73 (10/23 0824) Resp:  [20] 20 (10/23 0824) BP: (110)/(69) 110/69 (10/23 0824) SpO2:  [97 %] 97 % (10/23 0824)  Intake/Output from previous day: 10/22 0701 - 10/23 0700 In: 480 [P.O.:480] Out: -  Intake/Output this shift: Total I/O In: 240 [P.O.:240] Out: -   Recent Labs    09/09/22 0304  HGB 8.4*   Recent Labs    09/09/22 0304  WBC 17.5*  RBC 2.75*  HCT 26.2*  PLT 210   No results for input(s): "NA", "K", "CL", "CO2", "BUN", "CREATININE", "GLUCOSE", "CALCIUM" in the last 72 hours. No results for input(s): "LABPT", "INR" in the last 72 hours.  AO x 3  Capillary refill less than 2 seconds.  Able to gently wiggle digits. Incision: Dried blood on inside of bandage without purulent drainage.  No erythema present.    Assessment/Plan: 6 Days Post-Op Procedure(s) (LRB): ESOPHAGOGASTRODUODENOSCOPY (EGD) WITH PROPOFOL (N/A) BIOPSY SAVORY DILATION (N/A)  The bandage was changed and he tolerated this well. He will continue to keep the bandage clean and dry.  Encouraged him to work on gentle motion of the free digits.  Continue with pain management as needed.  Follow up on an outpatient basis. He will contact our office at [phone] to schedule the follow up.  The plan was discussed with the patient and his wife who voiced understanding and agreement.      Brynda Peon 09/11/2022, 5:13 PM

## 2022-09-12 ENCOUNTER — Encounter: Payer: Self-pay | Admitting: Gastroenterology

## 2022-09-12 DIAGNOSIS — K257 Chronic gastric ulcer without hemorrhage or perforation: Secondary | ICD-10-CM

## 2022-09-12 LAB — CBC
HCT: 26.5 % — ABNORMAL LOW (ref 39.0–52.0)
Hemoglobin: 8.1 g/dL — ABNORMAL LOW (ref 13.0–17.0)
MCH: 29.8 pg (ref 26.0–34.0)
MCHC: 30.6 g/dL (ref 30.0–36.0)
MCV: 97.4 fL (ref 80.0–100.0)
Platelets: 210 10*3/uL (ref 150–400)
RBC: 2.72 MIL/uL — ABNORMAL LOW (ref 4.22–5.81)
RDW: 20.8 % — ABNORMAL HIGH (ref 11.5–15.5)
WBC: 13.9 10*3/uL — ABNORMAL HIGH (ref 4.0–10.5)
nRBC: 0 % (ref 0.0–0.2)

## 2022-09-12 LAB — RENAL FUNCTION PANEL
Albumin: 2.3 g/dL — ABNORMAL LOW (ref 3.5–5.0)
Anion gap: 14 (ref 5–15)
BUN: 65 mg/dL — ABNORMAL HIGH (ref 8–23)
CO2: 25 mmol/L (ref 22–32)
Calcium: 9.4 mg/dL (ref 8.9–10.3)
Chloride: 88 mmol/L — ABNORMAL LOW (ref 98–111)
Creatinine, Ser: 7.68 mg/dL — ABNORMAL HIGH (ref 0.61–1.24)
GFR, Estimated: 7 mL/min — ABNORMAL LOW (ref >60)
Glucose, Bld: 123 mg/dL — ABNORMAL HIGH (ref 70–99)
Phosphorus: 5.1 mg/dL — ABNORMAL HIGH (ref 2.5–4.6)
Potassium: 5.3 mmol/L — ABNORMAL HIGH (ref 3.5–5.1)
Sodium: 127 mmol/L — ABNORMAL LOW (ref 135–145)

## 2022-09-12 LAB — GLUCOSE, CAPILLARY
Glucose-Capillary: 110 mg/dL — ABNORMAL HIGH (ref 70–99)
Glucose-Capillary: 114 mg/dL — ABNORMAL HIGH (ref 70–99)
Glucose-Capillary: 150 mg/dL — ABNORMAL HIGH (ref 70–99)
Glucose-Capillary: 162 mg/dL — ABNORMAL HIGH (ref 70–99)

## 2022-09-12 MED ORDER — ALBUMIN HUMAN 25 % IV SOLN
12.5000 g | Freq: Once | INTRAVENOUS | Status: AC
  Administered 2022-09-12: 12.5 g via INTRAVENOUS

## 2022-09-12 MED ORDER — SODIUM CHLORIDE 0.9 % IV SOLN: 12.0000 mg/kg | INTRAVENOUS | Status: DC

## 2022-09-12 MED ORDER — ALBUMIN HUMAN 25 % IV SOLN
INTRAVENOUS | Status: AC
  Filled 2022-09-12: qty 50

## 2022-09-12 MED ORDER — HYDROCODONE-ACETAMINOPHEN 7.5-325 MG PO TABS: 1.0000 | ORAL_TABLET | Freq: Four times a day (QID) | ORAL | 0 refills | Status: DC | PRN

## 2022-09-12 MED ORDER — HYDROCODONE-ACETAMINOPHEN 7.5-325 MG PO TABS: 1.0000 | ORAL_TABLET | Freq: Four times a day (QID) | ORAL | 0 refills | Status: AC | PRN

## 2022-09-12 MED ORDER — HEPARIN SODIUM (PORCINE) 1000 UNIT/ML IJ SOLN
INTRAMUSCULAR | Status: AC
  Administered 2022-09-12: 4200 [IU]
  Filled 2022-09-12: qty 4

## 2022-09-12 MED ORDER — SODIUM CHLORIDE 0.9 % IV SOLN: 2.0000 g | INTRAVENOUS | Status: DC

## 2022-09-12 MED ORDER — CHLORHEXIDINE GLUCONATE CLOTH 2 % EX PADS
6.0000 | MEDICATED_PAD | Freq: Every day | CUTANEOUS | Status: DC
  Administered 2022-09-13: 6 via TOPICAL

## 2022-09-12 MED ORDER — SODIUM CHLORIDE 0.9 % IV SOLN: 8.0000 mg/kg | INTRAVENOUS | Status: DC

## 2022-09-12 NOTE — Discharge Summary (Signed)
Physician Discharge Summary  Louis [name] QPY:[id number] DOB: 1946-06-24 DOA: 08/28/2022  PCP: [name], MD  Admit date: 08/28/2022 Discharge date: 09/12/2022  Admitted From: Home Disposition:  SNF  Recommendations for Outpatient Follow-up:  Follow up with orthopedic hand surgery in 1-2 weeks Please obtain BMP/CBC in one week Follow-up with ID in 6 weeks.  Home Health:No  Equipment/Devices:None  Discharge Condition:Stable CODE STATUS:Full Diet recommendation: Heart Healthy  Brief/Interim Summary: 76 y.o. male past medical history of end-stage renal disease on hemodialysis, diabetes mellitus type 2 essential hypertension, with a recent left upper arm AV fistula ligation and placement of left IJ catheter by vascular on 08/29/2032 comes into the ED per family physician recommendation as he has had left hand swelling from 2 days prior to admission, bloody drainage was noted in the ED x-ray of the hand showed extensive soft tissue swelling of the left thumb concerning for osteomyelitis and surgery was consulted patient was started on IV antibiotics  Discharge Diagnoses:  Principal Problem:   Osteomyelitis ([place]) Active Problems:   Essential hypertension   ESRD (end stage renal disease) (HCC)   Hyperlipidemia   CAD (coronary artery disease)   Obesity (BMI 30-39.9)   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 He underwent repeated surgical excision and debridement with amputation of the left thumb on 09/04/2022 ID was consulted recommended IV daptomycin and Fortaz for 6 weeks. They relate their follow-up with him in the clinic on 10/04/2022. He is started having profuse bleeding at the surgical site on 09/07/2022 which is subsided with holding of the Eliquis. Physical therapy evaluated the patient recommended short-term rehab. Orthopedic surgery evaluated the patient recommended to restart Eliquis.  End stage renal disease: Nephrology was consulted he was continued on his regular dialysis day. He will continue midodrine as an outpatient.  Hyperlipidemia: Continue Crestor.  Diabetes mellitus type 2 with hyperglycemia: No changes made to his insulin regimen he will continue current regimen.  Chronic atrial fibrillation: Rate control the changes made to his medication.  Acute blood loss anemia: His hemoglobin dropped from 10-8.4 close likely due to surgery and bleeding from the site bleeding subsided his hemoglobin remained stable after this bleeding was controlled.  Dysphagia: Due to esophageal stricture, there was an aspiration event on 08/31/2022 esophagogram showed distal stenosis of the GI tract. GI was consulted perform an EGD with dilation on 09/05/2022. We will start on Protonix p.o. twice daily and he has had no further episodes.  Chronic hyponatremia: Asymptomatic.  Obesity: Noted. Acute blood loss anemia   Discharge Instructions  Discharge Instructions     Diet - low sodium heart healthy   Complete by: As directed    Increase activity slowly   Complete by: As directed    No wound care   Complete by: As directed       Allergies as of 09/12/2022       Reactions   Hydrocodone Nausea And Vomiting  Other reaction(s): GI Upset (intolerance) Projectile vomiting   Oxycodone Nausea And Vomiting   Other reaction(s): GI Upset (intolerance), Vomiting  (intolerance) Projectile vomiting   Vancomycin Anaphylaxis   ORAL VANCOMYCIN for C diff.   Dacarbazine Other (See Comments)   Unknown reaction   Latex Itching, Rash   Tape Dermatitis, Itching, Rash   Patch used at dialysis        Medication List     STOP taking these medications    AMBULATORY NON FORMULARY MEDICATION   AMBULATORY NON FORMULARY MEDICATION   iron sucrose in sodium chloride 0.9 % 100 mL       TAKE these medications    B-D INS SYR ULTRAFINE 1CC/31G 31G X 5/16" 1 ML Misc Generic drug: Insulin Syringe-Needle U-100   blood glucose meter kit and supplies Use to check blood sugars daily E11.65   cefTAZidime 2 g in sodium chloride 0.9 % 100 mL Inject 2 g into the vein Every Tuesday,Thursday,and Saturday with dialysis.   CoQ10 200 MG Caps Take 200 mg by mouth daily.   DAPTOmycin 650 mg in sodium chloride 0.9 % 50 mL Inject 650 mg into the vein every other day.   DAPTOmycin 950 mg in sodium chloride 0.9 % 50 mL Inject 950 mg into the vein every Saturday at 6 PM. Start taking on: September 16, 2022   Eliquis 5 MG Tabs tablet Generic drug: apixaban Take 1 tablet (5 mg total) by mouth 2 (two) times daily.   FreeStyle Libre 14 Day Reader Kerrin Mo Dx DM E11.22 Check blood sugar 4 times daily.   FreeStyle Libre 14 Day Sensor Misc Dx DM E11.22 Check blood sugar 4 times daily.   furosemide 40 MG tablet Commonly known as: LASIX Take 80 mg by mouth 2 (two) times daily.   HECTOROL IV Doxercalciferol (Hectorol)   HYDROcodone-acetaminophen 7.5-325 MG tablet Commonly known as: NORCO Take 1 tablet by mouth every 6 (six) hours as needed for up to 8 days for moderate pain.   lidocaine-prilocaine cream Commonly known as: EMLA SMARTSIG:sparingly Topical As Directed   midodrine 10 MG tablet Commonly known as: PROAMATINE Take 10 mg by mouth as directed. Take 30 min. Before dialysis treatment   MIRCERA IJ Mircera   nitroGLYCERIN 0.4 MG SL tablet Commonly  known as: NITROSTAT Place 1 tablet (0.4 mg total) under the tongue every 5 (five) minutes as needed for chest pain.   NovoLIN N 100 UNIT/ML injection Generic drug: insulin NPH Human Inject 10 Units into the skin 2 (two) times daily before a meal.   NovoLIN R 100 units/mL injection Generic drug: insulin regular Inject 1-15 Units into the skin 3 (three) times daily with meals. Sliding Scale   omega-3 acid ethyl esters 1 g capsule Commonly known as: LOVAZA Take 1 g by mouth daily.   OneTouch Ultra test strip Generic drug: glucose blood USE 1 STRIP TO CHECK GLUCOSE THREE TIMES DAILY   PROBIOTIC DAILY PO Take 420 mg by mouth daily.   rosuvastatin 40 MG tablet Commonly known as: CRESTOR Take 1 tablet by mouth once daily   silver sulfADIAZINE 1 % cream Commonly known as: SILVADENE Apply 1 Application topically daily as needed (wound care, itching).   Vitamin D3 50 MCG (2000 UT) Tabs Take 2,000 Units by mouth daily.   VITAMIN E COMPLETE PO Take 1 capsule by mouth daily. Vitamin E complex   ZINC SULFATE PO Take 1 tablet by mouth daily.        Allergies  Allergen  Reactions   Hydrocodone Nausea And Vomiting    Other reaction(s): GI Upset (intolerance) Projectile vomiting    Oxycodone Nausea And Vomiting    Other reaction(s): GI Upset (intolerance), Vomiting (intolerance) Projectile vomiting    Vancomycin Anaphylaxis    ORAL VANCOMYCIN for C diff.   Dacarbazine Other (See Comments)    Unknown reaction   Latex Itching and Rash   Tape Dermatitis, Itching and Rash    Patch used at dialysis    Consultations: Orthopedic surgery Gastroenterology   Procedures/Studies: DG ESOPHAGUS W SINGLE CM (SOL OR THIN BA)  Result Date: 09/01/2022 CLINICAL DATA:  Provided history: Dysphagia. Additional history provided: Patient reports feeling as though foods are becoming stuck at the level of the mid chest. Gastroesophageal reflux. History of esophagitis and esophageal  erosions. EXAM: ESOPHAGUS/BARIUM SWALLOW/TABLET STUDY TECHNIQUE: A combined double and single contrast examination was performed using effervescent crystals, high-density barium and thin liquid barium. The exam was performed by Brynda Greathouse PA-C, and was supervised and interpreted by Dr. Kellie Simmering. FLUOROSCOPY TIME:  3 minutes, 30 seconds (35.0 mGy). COMPARISON:  None. FINDINGS: Somewhat limited examination due to the patient's inability to stand and limited ability to reposition on the fluoroscopy table. Apparent focal narrowing within the distal esophagus. A swallowed 13 mm barium tablet did not pass beyond this point despite a prolonged period of observation. Elsewhere, the esophagus appears normal in caliber and smooth in contour. Moderate-to-prominent intermittent esophageal dysmotility with tertiary contraction. Small-volume gastroesophageal reflux observed to the level of the lower esophagus. No appreciable hiatal hernia. IMPRESSION: Apparent focal narrowing within the distal esophagus. A swallowed 13 mm barium tablet did not pass beyond this point despite a prolonged period of observation. 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 Moderate-to-prominent intermittent esophageal dysmotility. Small-volume gastroesophageal reflux to the level of the lower esophagus. Electronically Signed   By: Kellie Simmering D.O.   On: 09/01/2022 14:54   VAS [place] UPPER EXTREMITY ARTERIAL DUPLEX  Result Date: 08/31/2022  UPPER EXTREMITY DUPLEX STUDY Patient Name:  Louis [name]  Date of Exam:   08/31/2022 Medical Rec #: [id number]        Accession #:    [id number] Date of Birth: 06-01-1946        Patient Gender: M Patient Age:   26 years Exam Location:  [facility] Procedure:      VAS [place] UPPER EXTREMITY ARTERIAL DUPLEX Referring Phys: Harrell Gave DICKSON --------------------------------------------------------------------------------   Indications: Bilateral pain, discoloration LT hand > RT. History:     Patient has a history of post left AVF ligation.  Limitations: Bandaging bilaterally Comparison Study: 08-13-2020 Prior upper extremity arterial limited LEFT                   pre-access Performing Technologist: Darlin Coco RDMS, RVT  Examination Guidelines: A complete evaluation includes B-mode imaging, spectral Doppler, color Doppler, and power Doppler as needed of all accessible portions of each vessel. Bilateral testing is considered an integral part of a complete examination. Limited examinations for reoccurring indications may be performed as noted.  Right Doppler Findings: +---------------+----------+-----------+--------+-------------+ Site           PSV (cm/s)Waveform   StenosisComments      +---------------+----------+-----------+--------+-------------+ Subclavian Dist          triphasic                        +---------------+----------+-----------+--------+-------------+ Brachial  Prox  68        triphasic          0.59 diameter +---------------+----------+-----------+--------+-------------+ Brachial Mid   90        triphasic          0.44 diameter +---------------+----------+-----------+--------+-------------+ Brachial Dist  75        triphasic          0.50 diameter +---------------+----------+-----------+--------+-------------+ Radial Prox    61        triphasic          0.20 diameter +---------------+----------+-----------+--------+-------------+ Radial Mid     64        triphasic          0.19 diameter +---------------+----------+-----------+--------+-------------+ Radial Dist    67        triphasic          0.20 diameter +---------------+----------+-----------+--------+-------------+ Ulnar Prox     62        triphasic          0.28 diameter +---------------+----------+-----------+--------+-------------+ Ulnar Mid      80        triphasic          0.20 diameter  +---------------+----------+-----------+--------+-------------+ Ulnar Dist     105       triphasic          0.22 diameter +---------------+----------+-----------+--------+-------------+ Palmar Arch    41        multiphasic                      +---------------+----------+-----------+--------+-------------+   Left Doppler Findings: +---------------+----------+-----------+--------+--------+ Site           PSV (cm/s)Waveform   StenosisComments +---------------+----------+-----------+--------+--------+ Subclavian Prox68        triphasic                   +---------------+----------+-----------+--------+--------+ Subclavian Mid 71        triphasic                   +---------------+----------+-----------+--------+--------+ Subclavian Dist65        triphasic                   +---------------+----------+-----------+--------+--------+ Axillary       89        triphasic                   +---------------+----------+-----------+--------+--------+ Brachial Prox  67        triphasic                   +---------------+----------+-----------+--------+--------+ Brachial Mid   91        triphasic                   +---------------+----------+-----------+--------+--------+ Brachial Dist  71        triphasic                   +---------------+----------+-----------+--------+--------+ Radial Prox    113       triphasic                   +---------------+----------+-----------+--------+--------+ Radial Mid     95        triphasic                   +---------------+----------+-----------+--------+--------+ Radial Dist    84        triphasic                   +---------------+----------+-----------+--------+--------+  Ulnar Prox     69        triphasic                   +---------------+----------+-----------+--------+--------+ Ulnar Mid      85        triphasic                   +---------------+----------+-----------+--------+--------+ Ulnar Dist      92        triphasic                   +---------------+----------+-----------+--------+--------+ Palmar Arch    37        multiphasic                 +---------------+----------+-----------+--------+--------+   Summary:  Right: No obstruction visualized in the right upper extremity. Left: No obstruction visualized in the left upper extremity. *See table(s) above for measurements and observations. Electronically signed by Orlie Pollen on 08/31/2022 at 8:12:23 PM.    Final    DG CHEST PORT 1 VIEW  Result Date: 08/31/2022 CLINICAL DATA:  Aspiration pneumonia. EXAM: PORTABLE CHEST 1 VIEW COMPARISON:  08/29/2022 FINDINGS: The left IJ dialysis catheter is stable. Stable borderline cardiac enlargement. Persistent left lower lobe infiltrate and left pleural effusion. IMPRESSION: Persistent left lower lobe infiltrate and left pleural effusion. Electronically Signed   By: Marijo Sanes M.D.   On: 08/31/2022 15:32   DG Chest Port 1 View  Result Date: 08/29/2022 CLINICAL DATA:  Placement of dialysis catheter, difficulty breathing EXAM: PORTABLE CHEST 1 VIEW COMPARISON:  10/27/2019 FINDINGS: There is interval placement of left IJ dialysis catheter with its tip in right atrium. Transverse diameter of heart is increased. There is increased density in left lower lung fields suggesting possible moderate sized left pleural effusion and underlying atelectasis/pneumonia. Central pulmonary vessels are prominent. Right lung is clear of any focal infiltrates. There is no pneumothorax. IMPRESSION: Tip of dialysis catheter is seen in the region of right atrium. There is no pneumothorax. Cardiomegaly. Moderate left pleural effusion. Possible atelectasis/pneumonia in left mid and left lower lung fields. Central pulmonary vessels are prominent without signs of alveolar pulmonary edema. Electronically Signed   By: Elmer Picker M.D.   On: 08/29/2022 16:44   DG C-Arm 1-60 Min-No Report  Result Date:  08/29/2022 Fluoroscopy was utilized by the requesting physician.  No radiographic interpretation.   DG Hand Complete Right  Result Date: 08/29/2022 CLINICAL DATA:  Osteomyelitis EXAM: RIGHT HAND - COMPLETE 3+ VIEW COMPARISON:  None Available. FINDINGS: Advanced vascular calcifications are seen within the right hand. There is a superficial wound involving the distal aspect of the ring finger. There are erosive changes involving the subjacent distal tuft of the fourth distal phalanx suggesting osteomyelitis in this location. There are, additionally, probable chronic erosive changes involving the distal tuft of the distal phalanges of the third digit and thumb. This may reflect the sequela of remote or chronic osteomyelitis. Alternatively, vasculopathy such is Raynaud's phenomena or thermal injuries such as frostbite could result in a similar appearance. Superimposed mild degenerative changes are noted. No acute fracture or dislocation. IMPRESSION: Superficial wound involving the distal aspect of the right ring finger with associated erosive changes involving the subjacent distal tuft of the fourth distal phalanx suggesting osteomyelitis. Additional erosive changes involving the distal tuft of the distal phalanx of the thumb and long finger. See differential considerations above. Peripheral vascular disease. Electronically Signed   By: Cassandria Anger  Christa See M.D.   On: 08/29/2022 00:09   DG Finger Thumb Left  Result Date: 08/29/2022 CLINICAL DATA:  Left thumb pain, swelling, throbbing EXAM: LEFT THUMB 2+V COMPARISON:  None Available. FINDINGS: There is extensive soft tissue swelling of the left thumb with foci of subcutaneous gas noted. Superficial ulcer noted involving the distal aspect of the left thumb. There is subjacent erosive changes involving the distal tuft of the distal phalanx in keeping with osteomyelitis in this location. Advanced vascular calcifications are noted. Degenerative changes are noted within  the interphalangeal joint and first metacarpophalangeal joint of the thumb. IMPRESSION: Extensive soft tissue swelling of the left thumb with subcutaneous gas and subjacent erosive changes involving the distal tuft of the distal phalanx in keeping with osteomyelitis. Electronically Signed   By: Fidela Salisbury M.D.   On: 08/29/2022 00:04   DG Hand Complete Left  Result Date: 08/29/2022 CLINICAL DATA:  Osteomyelitis EXAM: LEFT HAND - COMPLETE 3+ VIEW COMPARISON:  None Available. FINDINGS: Advanced vascular calcifications are seen within the left hand. There is diffuse soft tissue swelling of the left hand, most severely involving the thumb, the inner eminence, and dorsum of the left hand. There is subcutaneous gas identified within the soft tissues of the thumb. There is a superficial wound involving the distal aspect of the thumb. There is subjacent erosive changes involving the distal tuft of the distal phalanx in keeping with osteomyelitis in this location. Erosive changes are noted involving the distal tuft of the index finger with surrounding soft tissue swelling. No acute fracture or dislocation. Remaining joint spaces appear preserved. IMPRESSION: 1. Superficial wound involving the distal aspect of the thumb with subjacent erosive changes involving the distal tuft of the distal phalanx in keeping with osteomyelitis. 2. Subcutaneous gas within the soft tissues of the thumb. 3. Erosive changes involving the distal tuft of the index finger with associated soft tissue swelling in keeping with probable osteomyelitis in this location as well. Electronically Signed   By: Fidela Salisbury M.D.   On: 08/29/2022 00:02   (Echo, Carotid, EGD, Colonoscopy, ERCP)    Subjective: No complaints.  Discharge Exam: Vitals:   09/12/22 1100 09/12/22 1130  BP: 102/63 93/60  Pulse: 64 60  Resp: 15 14  Temp:    SpO2:     Vitals:   09/12/22 1000 09/12/22 1030 09/12/22 1100 09/12/22 1130  BP: 98/70 111/64 102/63  93/60  Pulse: 64  64 60  Resp: _0 Temp:      TempSrc:      SpO2:      Weight:      Height:        General: Pt is alert, awake, not in acute distress Cardiovascular: RRR, S1/S2 +, no rubs, no gallops Respiratory: CTA bilaterally, no wheezing, no rhonchi Abdominal: Soft, NT, ND, bowel sounds + Extremities: no edema, no cyanosis    The results of significant diagnostics from this hospitalization (including imaging, microbiology, ancillary and laboratory) are listed below for reference.     Microbiology: Recent Results (from the past 240 hour(s))  Aerobic/Anaerobic Culture w Gram Stain (surgical/deep wound)     Status: None   Collection Time: 09/04/22  1:51 PM   Specimen: Soft Tissue, Other  Result Value Ref Range Status   Specimen Description TISSUE LEFT THUMB  Final   Special Requests REVISION AMPUTATION  Final   Gram Stain   Final    FEW WBC PRESENT,BOTH PMN AND MONONUCLEAR FEW GRAM POSITIVE COCCI  IN CLUSTERS RARE GRAM NEGATIVE RODS    Culture   Final    MODERATE METHICILLIN RESISTANT STAPHYLOCOCCUS AUREUS NO ANAEROBES ISOLATED Performed at [facility], [place] [address] [place], [place] [address]    Report Status 09/09/2022 FINAL  Final   Organism ID, Bacteria METHICILLIN RESISTANT STAPHYLOCOCCUS AUREUS  Final      Susceptibility   Methicillin resistant staphylococcus aureus - MIC*    CIPROFLOXACIN >=8 RESISTANT Resistant     ERYTHROMYCIN >=8 RESISTANT Resistant     GENTAMICIN <=0.5 SENSITIVE Sensitive     OXACILLIN >=4 RESISTANT Resistant     TETRACYCLINE <=1 SENSITIVE Sensitive     VANCOMYCIN 1 SENSITIVE Sensitive     TRIMETH/SULFA <=10 SENSITIVE Sensitive     CLINDAMYCIN <=0.25 SENSITIVE Sensitive     RIFAMPIN <=0.5 SENSITIVE Sensitive     Inducible Clindamycin NEGATIVE Sensitive     * MODERATE METHICILLIN RESISTANT STAPHYLOCOCCUS AUREUS     Labs: BNP (last 3 results) No results for input(s): "BNP" in the last 8760 hours. Basic Metabolic  Panel: Recent Labs  Lab 09/08/22 0308 09/12/22 0335  NA 131* 127*  K 4.0 5.3*  CL 93* 88*  CO2 24 25  GLUCOSE 196* 123*  BUN 40* 65*  CREATININE 4.83* 7.68*  CALCIUM 8.9 9.4  PHOS  --  5.1*   Liver Function Tests: Recent Labs  Lab 09/12/22 0335  ALBUMIN 2.3*   No results for input(s): "LIPASE", "AMYLASE" in the last 168 hours. No results for input(s): "AMMONIA" in the last 168 hours. CBC: Recent Labs  Lab 09/06/22 0312 09/07/22 0829 09/08/22 0308 09/09/22 0304 09/12/22 0335  WBC 16.9* 15.2* 15.7* 17.5* 13.9*  NEUTROABS 14.2* 12.4* 12.4* 14.9*  --   HGB 9.5* 8.3* 8.2* 8.4* 8.1*  HCT 30.8* 25.6* 26.1* 26.2* 26.5*  MCV 94.2 94.8 94.6 95.3 97.4  PLT 222 204 201 210 210   Cardiac Enzymes: Recent Labs  Lab 09/07/22 0437  CKTOTAL 58   BNP: Invalid input(s): "POCBNP" CBG: Recent Labs  Lab 09/11/22 0802 09/11/22 1240 09/11/22 1629 09/11/22 2027 09/12/22 0727  GLUCAP 130* 156* 150* 148* 110*   D-Dimer No results for input(s): "DDIMER" in the last 72 hours. Hgb A1c No results for input(s): "HGBA1C" in the last 72 hours. Lipid Profile No results for input(s): "CHOL", "HDL", "LDLCALC", "TRIG", "CHOLHDL", "LDLDIRECT" in the last 72 hours. Thyroid function studies No results for input(s): "TSH", "T4TOTAL", "T3FREE", "THYROIDAB" in the last 72 hours.  Invalid input(s): "FREET3" Anemia work up No results for input(s): "VITAMINB12", "FOLATE", "FERRITIN", "TIBC", "IRON", "RETICCTPCT" in the last 72 hours. Urinalysis    Component Value Date/Time   COLORURINE YELLOW 02/02/2017 1146   APPEARANCEUR CLEAR 02/02/2017 1146   LABSPEC 1.011 02/02/2017 1146   PHURINE 5.0 02/02/2017 1146   GLUCOSEU NEGATIVE 02/02/2017 1146   HGBUR NEGATIVE 02/02/2017 1146   BILIRUBINUR negative 12/16/2021 1507   BILIRUBINUR neg 01/28/2016 1159   KETONESUR negative 12/16/2021 1507   KETONESUR NEGATIVE 02/02/2017 1146   PROTEINUR 100 (A) 02/02/2017 1146   UROBILINOGEN 0.2 12/16/2021  1507   NITRITE Negative 12/16/2021 1507   NITRITE NEGATIVE 02/02/2017 1146   LEUKOCYTESUR Small (1+) (A) 12/16/2021 1507   Sepsis Labs Recent Labs  Lab 09/07/22 0829 09/08/22 0308 09/09/22 0304 09/12/22 0335  WBC 15.2* 15.7* 17.5* 13.9*   Microbiology Recent Results (from the past 240 hour(s))  Aerobic/Anaerobic Culture w Gram Stain (surgical/deep wound)     Status: None   Collection Time: 09/04/22  1:51  PM   Specimen: Soft Tissue, Other  Result Value Ref Range Status   Specimen Description TISSUE LEFT THUMB  Final   Special Requests REVISION AMPUTATION  Final   Gram Stain   Final    FEW WBC PRESENT,BOTH PMN AND MONONUCLEAR FEW GRAM POSITIVE COCCI IN CLUSTERS RARE GRAM NEGATIVE RODS    Culture   Final    MODERATE METHICILLIN RESISTANT STAPHYLOCOCCUS AUREUS NO ANAEROBES ISOLATED Performed at [facility], [place] [address] [place], [place] [address]    Report Status 09/09/2022 FINAL  Final   Organism ID, Bacteria METHICILLIN RESISTANT STAPHYLOCOCCUS AUREUS  Final      Susceptibility   Methicillin resistant staphylococcus aureus - MIC*    CIPROFLOXACIN >=8 RESISTANT Resistant     ERYTHROMYCIN >=8 RESISTANT Resistant     GENTAMICIN <=0.5 SENSITIVE Sensitive     OXACILLIN >=4 RESISTANT Resistant     TETRACYCLINE <=1 SENSITIVE Sensitive     VANCOMYCIN 1 SENSITIVE Sensitive     TRIMETH/SULFA <=10 SENSITIVE Sensitive     CLINDAMYCIN <=0.25 SENSITIVE Sensitive     RIFAMPIN <=0.5 SENSITIVE Sensitive     Inducible Clindamycin NEGATIVE Sensitive     * MODERATE METHICILLIN RESISTANT STAPHYLOCOCCUS AUREUS    SIGNED:   Charlynne Cousins, MD  [facility] 09/12/2022, 11:55 AM Pager   If 7PM-7AM, please contact night-coverage [email or web address] Password [email or web address]

## 2022-09-12 NOTE — Progress Notes (Signed)
OT Cancellation Note  Patient Details Name: DRAVON NOTT MRN: [id number] DOB: 09/16/1946   Cancelled Treatment:    Reason Eval/Treat Not Completed: Patient at procedure or test/ unavailable (Patient off unit for HD.  Will attempt later today as schedule permits) Lodema Hong, [place]  Office [place] 09/12/2022, 9:12 AM

## 2022-09-12 NOTE — Progress Notes (Signed)
   09/12/22 1330  Vitals  Temp (!) 97.4 F (36.3 C)  Pulse Rate 64  Resp 17  BP 110/70  SpO2 96 %  O2 Device Room Air  Oxygen Therapy  Patient Activity (if Appropriate) In bed  Post Treatment  Dialyzer Clearance Clear  Duration of HD Treatment -hour(s) 4 hour(s)  Hemodialysis Intake (mL) 0 mL  Liters Processed 96  Fluid Removed 2500 mL  Tolerated HD Treatment Yes   Received patient in bed to unit.  Alert and oriented.  Informed consent signed and in chart.    Patient tolerated well.  Transported back to the room  Alert, without acute distress.  Hand-off given to patient's nurse.   Access used: [facility]  Access issues: none   Medication(s) given: midodrine and albumin    [facility]

## 2022-09-12 NOTE — Procedures (Signed)
Seen and examined on dialysis.  Blood pressure 106/69 and HR and HR 72.  Tolerating goal.  Procedure supervised.  Left IJ tunn catheter in use.   Change to 2K bath. Spoke with tech  Claudia Desanctis, MD 09/12/2022  9:49 AM

## 2022-09-12 NOTE — Plan of Care (Signed)

## 2022-09-12 NOTE — Progress Notes (Signed)
Pt has been accepted as a transient at [facility] on MWF 11:10 chair time. Pt can start tomorrow and will need to arrive at 10:30 to complete paperwork prior to treatment. Clinic advised that pt will need iv daptomycin and fortaz with HD treatments. Clinic has fortaz but will have to order daptomycin. Clinic may receive med as early as tomorrow or as late as Thursday. Advised clinic to please contact navigator once med received so pt's d/c can be coordinated. Update provided to attending, nephrologist, renal PA, CSW and pt's RN via secure chat. Pt cannot be d/c until clinic receives needed abx. Spoke to pt's wife via phone to discuss above details. Wife and pt agreeable to out-pt arrangements at [place] until pt able to d/c from rehab and return to [facility] on TTS. Will add out-pt HD arrangements to AVS.   Melven Sartorius Renal Navigator [phone]

## 2022-09-12 NOTE — Progress Notes (Addendum)
[facility] Progress Note   Subjective:   Patient seen and examine at bedside during dialysis.  Tolerating treatment well.  Denies CP, SOB, abdominal pain and n/v/d.  Waiting for SNF placement.   Objective Vitals:   09/12/22 0900 09/12/22 0915 09/12/22 0922 09/12/22 0930  BP: 100/74 (!) 89/64 107/63 106/69  Pulse: 83 68 77 81  Resp: '17 13  19  '$ Temp: 97.7 F (36.5 C)     TempSrc:      SpO2: 99%     Weight: 101.1 kg     Height:       Physical Exam General:WDWN male in NAD Heart:IRIR, no mrg Lungs:CTAB, nml WOB on RA Abdomen:soft, NTND Extremities:1+ LE edema R>L Dialysis Access: [facility] in place   [facility] Weights   09/09/22 0952 09/09/22 1433 09/12/22 0900  Weight: 104.1 kg 99.4 kg 101.1 kg   No intake or output data in the 24 hours ending 09/12/22 0948  Additional Objective Labs: Basic Metabolic Panel: Recent Labs  Lab 09/08/22 0308 09/12/22 0335  NA 131* 127*  K 4.0 5.3*  CL 93* 88*  CO2 24 25  GLUCOSE 196* 123*  BUN 40* 65*  CREATININE 4.83* 7.68*  CALCIUM 8.9 9.4  PHOS  --  5.1*   Liver Function Tests: Recent Labs  Lab 09/12/22 0335  ALBUMIN 2.3*   CBC: Recent Labs  Lab 09/06/22 0312 09/07/22 0829 09/08/22 0308 09/09/22 0304 09/12/22 0335  WBC 16.9* 15.2* 15.7* 17.5* 13.9*  NEUTROABS 14.2* 12.4* 12.4* 14.9*  --   HGB 9.5* 8.3* 8.2* 8.4* 8.1*  HCT 30.8* 25.6* 26.1* 26.2* 26.5*  MCV 94.2 94.8 94.6 95.3 97.4  PLT 222 204 201 210 210     Cardiac Enzymes: Recent Labs  Lab 09/07/22 0437  CKTOTAL 58   CBG: Recent Labs  Lab 09/11/22 0802 09/11/22 1240 09/11/22 1629 09/11/22 2027 09/12/22 0727  GLUCAP 130* 156* 150* 148* 110*    Medications:  albumin human     cefTAZidime (FORTAZ)  IV 2 g (09/09/22 1557)   DAPTOmycin (CUBICIN) 650 mg in sodium chloride 0.9 % IVPB 650 mg (09/07/22 2009)   DAPTOmycin (CUBICIN) 950 mg in sodium chloride 0.9 % IVPB 950 mg (09/09/22 2007)    Chlorhexidine Gluconate Cloth  6 each Topical  Q0600   [START ON 09/14/2022] darbepoetin (ARANESP) injection - DIALYSIS  150 mcg Intravenous Q Thu-HD   furosemide  80 mg Oral BID   heparin sodium (porcine)       insulin aspart  0-24 Units Subcutaneous TID WC   insulin aspart  4 Units Subcutaneous TID WC   insulin detemir  30 Units Subcutaneous Daily   midodrine  10 mg Oral Q T,Th,Sa-HD   pantoprazole  40 mg Oral BID   rosuvastatin  10 mg Oral Daily   sevelamer carbonate  1,600 mg Oral TID WC    Dialysis Orders: TTS SW 4h 450/ 500  101 kg  3K/2.5 bath  Hep none LUA AVF - last HD post HD 101.6kg on 10/7 - last Hb 10.0 10/5 - mircera 50 q 2, last 9/26 - venofer 100 mg tiw IV thru 10/19 - doxercalciferol 5 ug ttw IV   Home meds include - eliquis, furosemide 40 qd, isordil 20, midodrine 10 prn, novolin insulin, rosuvastatin, vits/ supps / prns   Assessment/Plan: ESRD- on HD TTS.  Next HD today.  Looks like they will change HD centers.  Still waiting for new HD center.   Volume overload/BP- midodrine 10 mg  TTS predialysis ordered recently. Tolerating UF 4L each HD here.  +hyponatremia, improved with HD.  Remains volume overloaded, continue max UF as tolerated. Will need dry weight lowered on d/c. wife wants to continue diuretic-  does not really make a lot of urine so I dont think its doing much but she is really wanting to continue  Osteomyelitis left hand/thumb status post left arm AV fistula ligation/ left thumb flexor sheath I&D , bone curettage and debridement of osteomyelitis of the distal phalanx on 10/11 and returned to OR on 10/16 for open debridement w/L thumb amputation thru proximal phalanx by Dr. Apolonio Schneiders (Hand ortho). Hand surgeon notified.  ID antibiotic plan now daptomycin and Fortaz  6wk total follow-up in ID clinic on 10/04/2022 and per VVS has adequate blood flow no angiogram needed.  Need to ensure temp HD clinic has ABX available prior to discharge.  Dysphagia/distal esophageal narrowing (on barium swallow study )- EGD  w/esophageal dilation yesterday.  Symptoms improved, now on renal diet.  DM2- plan per admit team Anemia of ESRD -No iron with infection- Hgb 8.1 today. ESA recently raised to 179mg qwk.  Secondary hyperparathyroidism - phos and Ca in goal. Renvela 800 increased 1600 mg.   Nutrition - Renal/carb modified diet w/fluid restrictions once advanced.  Permanent A Fib - Eliquis on hold.  Dispo: Awaiting SNF placement. [facility]. Renal navigator waiting to see where patient will be accepted.  Need to ensure HD center has daptomycin available prior to discharge.   LJen Mow PA-C [facility] 09/12/2022,9:48 AM  LOS: 13 days   Seen and examined independently.  Agree with note and exam as documented above by physician extender and as noted here.  See my procedure note from today  LClaudia Desanctis MD 09/12/2022  12:12 PM

## 2022-09-12 NOTE — Progress Notes (Signed)
Pt. Arrived to unit from HD, alert and oriented

## 2022-09-12 NOTE — Plan of Care (Addendum)
Patient is changing schedules due to switching to temporary clinic.  Orders written for HD tomorrow per new schedule - MWF.  Can discharge once clinic receives daptomycin.   Jen Mow, PA-C [employer]

## 2022-09-13 LAB — GLUCOSE, CAPILLARY
Glucose-Capillary: 157 mg/dL — ABNORMAL HIGH (ref 70–99)
Glucose-Capillary: 186 mg/dL — ABNORMAL HIGH (ref 70–99)

## 2022-09-13 MED ORDER — HEPARIN SODIUM (PORCINE) 1000 UNIT/ML IJ SOLN
INTRAMUSCULAR | Status: AC
  Administered 2022-09-13: 4200 [IU]
  Filled 2022-09-13: qty 5

## 2022-09-13 MED ORDER — MIDODRINE HCL 5 MG PO TABS
10.0000 mg | ORAL_TABLET | ORAL | Status: DC
  Administered 2022-09-13: 10 mg via ORAL
  Filled 2022-09-13 (×2): qty 2

## 2022-09-13 MED ORDER — SODIUM CHLORIDE 0.9 % IV SOLN: 12.0000 mg/kg | INTRAVENOUS | Status: AC

## 2022-09-13 MED ORDER — SODIUM CHLORIDE 0.9 % IV SOLN: 8.0000 mg/kg | INTRAVENOUS | Status: AC

## 2022-09-13 MED ORDER — SODIUM CHLORIDE 0.9 % IV SOLN: 12.0000 mg/kg | INTRAVENOUS | Status: DC

## 2022-09-13 MED ORDER — HEPARIN SODIUM (PORCINE) 1000 UNIT/ML IJ SOLN
INTRAMUSCULAR | Status: AC
  Filled 2022-09-13: qty 5

## 2022-09-13 MED ORDER — SODIUM CHLORIDE 0.9 % IV SOLN: 2.0000 g | INTRAVENOUS | Status: AC

## 2022-09-13 MED ORDER — APIXABAN 5 MG PO TABS
5.0000 mg | ORAL_TABLET | Freq: Two times a day (BID) | ORAL | Status: DC
  Administered 2022-09-13 (×2): 5 mg via ORAL
  Filled 2022-09-13 (×3): qty 1

## 2022-09-13 MED ORDER — SODIUM CHLORIDE 0.9 % IV SOLN
8.0000 mg/kg | INTRAVENOUS | Status: DC
  Administered 2022-09-13: 650 mg via INTRAVENOUS
  Filled 2022-09-13 (×2): qty 13

## 2022-09-13 MED ORDER — SODIUM CHLORIDE 0.9 % IV SOLN
2.0000 g | INTRAVENOUS | Status: DC
  Filled 2022-09-13: qty 2

## 2022-09-13 NOTE — Procedures (Signed)
Seen and examined on dialysis.  Blood pressure 106/75 and HR 108. Procedure supervised.  Tolerating goal.  Left IJ tunn catheter in use.  Spoke with SW and she is reaching out to HD clinic to confirm antibiotics rec'd.   Claudia Desanctis, MD 09/13/2022 10:59 AM

## 2022-09-13 NOTE — Progress Notes (Addendum)
[facility] Progress Note   Subjective:   Patient seen and examined at bedside in dialysis.  Tolerating treatment well.  No specific complaints.  Denies CP, SOB, abdominal pain and n/v/d.  Dialysis center now has ABX needed for patient to d/c.    Objective Vitals:   09/13/22 1006 09/13/22 1030 09/13/22 1100 09/13/22 1130  BP: 109/74 101/73 109/69 106/74  Pulse: 99 (!) 116 (!) 104 87  Resp: (!) '21 19 16 19  '$ Temp:      TempSrc:      SpO2: 99% 97% 97% 96%  Weight:      Height:       Physical Exam General:WDWN male in NAD Heart:RRR, no mrg Lungs:CTAB, nml WOB on RA Abdomen:soft, NTND Extremities:1+ edema b/l R>L Dialysis Access: [facility] in use   Filed Weights   09/09/22 1433 09/12/22 0900 09/13/22 0759  Weight: 99.4 kg 101.1 kg 99 kg    Intake/Output Summary (Last 24 hours) at 09/13/2022 1141 Last data filed at 09/12/2022 1330 Gross per 24 hour  Intake --  Output 2500 ml  Net -2500 ml    Additional Objective Labs: Basic Metabolic Panel: Recent Labs  Lab 09/08/22 0308 09/12/22 0335  NA 131* 127*  K 4.0 5.3*  CL 93* 88*  CO2 24 25  GLUCOSE 196* 123*  BUN 40* 65*  CREATININE 4.83* 7.68*  CALCIUM 8.9 9.4  PHOS  --  5.1*   Liver Function Tests: Recent Labs  Lab 09/12/22 0335  ALBUMIN 2.3*   CBC: Recent Labs  Lab 09/07/22 0829 09/08/22 0308 09/09/22 0304 09/12/22 0335  WBC 15.2* 15.7* 17.5* 13.9*  NEUTROABS 12.4* 12.4* 14.9*  --   HGB 8.3* 8.2* 8.4* 8.1*  HCT 25.6* 26.1* 26.2* 26.5*  MCV 94.8 94.6 95.3 97.4  PLT 204 201 210 210   Cardiac Enzymes: Recent Labs  Lab 09/07/22 0437  CKTOTAL 58   CBG: Recent Labs  Lab 09/11/22 2027 09/12/22 0727 09/12/22 1434 09/12/22 1557 09/12/22 2233  GLUCAP 148* 110* 114* 150* 162*   Medications:  cefTAZidime (FORTAZ)  IV     DAPTOmycin (CUBICIN) 650 mg in sodium chloride 0.9 % IVPB     [START ON 09/15/2022] DAPTOmycin (CUBICIN) 950 mg in sodium chloride 0.9 % IVPB      apixaban  5 mg  Oral BID   Chlorhexidine Gluconate Cloth  6 each Topical Q0600   Chlorhexidine Gluconate Cloth  6 each Topical Q0600   [START ON 09/14/2022] darbepoetin (ARANESP) injection - DIALYSIS  150 mcg Intravenous Q Thu-HD   furosemide  80 mg Oral BID   insulin aspart  0-24 Units Subcutaneous TID WC   insulin aspart  4 Units Subcutaneous TID WC   insulin detemir  30 Units Subcutaneous Daily   midodrine  10 mg Oral Q M,W,F-HD   pantoprazole  40 mg Oral BID   rosuvastatin  10 mg Oral Daily   sevelamer carbonate  1,600 mg Oral TID WC    Dialysis Orders: TTS SW 4h 450/ 500  101 kg  3K/2.5 bath  Hep none LUA AVF - last HD post HD 101.6kg on 10/7 - last Hb 10.0 10/5 - mircera 50 q 2, last 9/26 - venofer 100 mg tiw IV thru 10/19 - doxercalciferol 5 ug ttw IV   Home meds include - eliquis, furosemide 40 qd, isordil 20, midodrine 10 prn, novolin insulin, rosuvastatin, vits/ supps / prns   Assessment/Plan: ESRD- on HD TTS.  HD today on new schedule.  New  clinic now has ABX and patient can start on Friday.  Volume overload/BP- midodrine 10 mg TTS predialysis ordered recently. Tolerating UF 4L each HD here.  +hyponatremia, improved with HD.  Remains volume overloaded, continue max UF as tolerated. Will need dry weight lowered on d/c. wife wants to continue diuretic-  does not really make a lot of urine so I dont think its doing much but she is really wanting to continue  Osteomyelitis left hand/thumb status post left arm AV fistula ligation/ left thumb flexor sheath I&D , bone curettage and debridement of osteomyelitis of the distal phalanx on 10/11 and returned to OR on 10/16 for open debridement w/L thumb amputation thru proximal phalanx by Dr. Apolonio Schneiders (Hand ortho). Hand surgeon notified.  ID antibiotic plan now daptomycin and Fortaz  6wk total follow-up in ID clinic on 10/04/2022 and per VVS has adequate blood flow no angiogram needed.  Dysphagia/distal esophageal narrowing (on barium swallow study )- EGD  w/esophageal dilation yesterday.  Symptoms improved, now on renal diet.  DM2- plan per admit team Anemia of ESRD -No iron with infection- Hgb 8.1 today. ESA recently raised to 158mg qwk.  Secondary hyperparathyroidism - phos and Ca in goal. Renvela 800 increased 1600 mg.   Nutrition - Renal/carb modified diet w/fluid restrictions once advanced.  Permanent A Fib - Eliquis on hold.  [facility]   LJen Mow PA-C [facility] 09/13/2022,11:41 AM  LOS: 14 days   Seen and examined independently.  Agree with note and exam as documented above by physician extender and as noted here.  See procedure note from today  LClaudia Desanctis MD 09/13/2022  6:13 PM

## 2022-09-13 NOTE — Progress Notes (Addendum)
Contacted by [place] that clinic has received iv daptomycin today and will have available for pt's treatment on Friday. Update provided to the team and pt will d/c to snf today. Message left for pt's wife that clinic received iv abx today and ready to treat pt on Friday. Pt will need to arrive on Friday at 10:30 at [facility] to complete paperwork prior to 11:10 chair time. This info was provided to [place] and pt's wife yesterday. Renal PA aware of pt's d/c and to send orders to clinic. Clinic advised of pt's d/c today and that pt will start on Friday.   Melven Sartorius Renal Navigator [phone]

## 2022-09-13 NOTE — Progress Notes (Signed)
[facility] PROGRESS NOTE    Progress Note  [name]  QQV:[id number] DOB: 08-14-1946 DOA: 08/28/2022 PCP: [name], MD     Brief Narrative:   Louis [name] is an 76 y.o. 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 ID was consulted they recommended IV daptomycin and Tressie Ellis for 6 weeks and follow-up with them in the clinic on 10/04/2022. Physical therapy evaluated the patient recommended short-term rehab. Patient is medically stable to discharge to skilled. Orthopedic surgery agree with restarting Eliquis. Awaiting to confirm that antibiotics have been received at dialysis center in order to proceed with discharge.  End-stage renal disease on hemodialysis: Nephrology on board for dialysis today.  Hyperlipidemia: Continue Crestor.  Diabetes mellitus type 2 with hyperglycemia: She was started on long-acting insulin sliding scale his blood glucose fairly controlled today.  Chronic atrial fibrillation: Currently on a rate controlling medication. Anticoagulation had to be held due to profuse bleeding from the left hand at the surgical site. Orthopedic surgery relates okay to start anticoagulation.  Acute blood loss anemia: Hemoglobin dropped from 10 on  admission to 8.4 this morning.  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  He was also found to have nonbleeding gastric ulcer with a clean base started on Protonix p.o. twice daily.  Chronic hyponatremia Asymptomatic.  Obesity (BMI 30-39.9) Noted  DVT prophylaxis: lovenox Family Communication:  Status is: Inpatient Remains inpatient appropriate because: Osteomyelitis and cellulitis    Code Status:     Code Status Orders  (From admission, onward)           Start     Ordered   08/29/22 0550  Full code  Continuous        08/29/22 0549           Code Status History     Date Active Date Inactive Code Status Order ID Comments User Context   12/14/2018 1635 12/20/2018 1337 Full Code [id number]  Lady Deutscher, MD Inpatient   02/02/2017 1415 02/03/2017 1828 Full Code [id number]  Radene Gunning, NP ED      Advance Directive Documentation    Flowsheet Row Most Recent Value  Type of Advance Directive Healthcare Power of Attorney, Living will  Pre-existing out of facility DNR order (yellow form or pink MOST form) --  "MOST" Form in Place? --         IV Access:   Peripheral IV   Procedures and diagnostic studies:   No results found.   Medical Consultants:   None.   Subjective:    Louis [name] chest pain is controlled.  Objective:    Vitals:   09/13/22 0930 09/13/22 1000 09/13/22 1006 09/13/22 1030  BP: 107/73 109/74 109/74 101/73  Pulse: 82  99 (!) 116  Resp: 16 16 (!) 21 19  Temp:      TempSrc:      SpO2: 98% 99% 99% 97%  Weight:      Height:       SpO2: 97 % O2 Flow Rate (L/min): 2 L/min   Intake/Output Summary (Last 24 hours) at 09/13/2022 1102 Last data filed at 09/12/2022 1330 Gross per 24 hour  Intake --  Output 2500 ml  Net -2500 ml    Filed  Weights   09/09/22 1433 09/12/22 0900 09/13/22 0759  Weight: 99.4 kg 101.1 kg 99 kg    Exam: General exam: In no acute distress. Respiratory system: Good air movement and clear to auscultation. Cardiovascular system: S1 & S2 heard, RRR. No JVD. Gastrointestinal system: Abdomen is nondistended, soft and nontender.  Extremities: No pedal edema. Skin: No rashes, lesions or ulcers Psychiatry: Judgement and insight appear normal. Mood & affect appropriate.  Data Reviewed:    Labs: Basic Metabolic Panel: Recent Labs  Lab 09/08/22 0308 09/12/22 0335  NA 131* 127*  K 4.0 5.3*  CL 93* 88*  CO2 24 25  GLUCOSE 196* 123*  BUN 40* 65*  CREATININE 4.83* 7.68*  CALCIUM 8.9 9.4  PHOS  --  5.1*    GFR Estimated Creatinine Clearance: 9.2 mL/min (A) (by C-G formula based on SCr of 7.68 mg/dL (H)). Liver Function Tests: Recent Labs  Lab 09/12/22 0335  ALBUMIN 2.3*    No results for input(s): "LIPASE", "AMYLASE" in the last 168 hours. No results for input(s): "AMMONIA" in the last 168 hours. Coagulation profile No results for input(s): "INR", "PROTIME" in the last 168 hours. COVID-19 Labs  No results for input(s): "DDIMER", "FERRITIN", "LDH", "CRP" in the last 72 hours.  Lab Results  Component Value Date   SARSCOV2NAA NEGATIVE 10/25/2020   [place] NEGATIVE 08/30/2020   [place] Not Detected 11/17/2019   SARSCOV2NAA POSITIVE (A) 10/27/2019    CBC: Recent Labs  Lab 09/07/22 0829 09/08/22 0308 09/09/22 0304 09/12/22 0335  WBC 15.2* 15.7* 17.5* 13.9*  NEUTROABS 12.4* 12.4* 14.9*  --   HGB 8.3* 8.2* 8.4* 8.1*  HCT 25.6* 26.1* 26.2* 26.5*  MCV 94.8 94.6 95.3 97.4  PLT 204 201 210 210    Cardiac Enzymes: Recent Labs  Lab 09/07/22 0437  CKTOTAL 58    BNP (last 3 results) No results for input(s): "PROBNP" in the last 8760 hours. CBG: Recent Labs  Lab 09/11/22 2027 09/12/22 0727 09/12/22 1434 09/12/22 1557 09/12/22 2233  GLUCAP 148* 110* 114* 150*  162*    D-Dimer: No results for input(s): "DDIMER" in the last 72 hours. Hgb A1c: No results for input(s): "HGBA1C" in the last 72 hours. Lipid Profile: No results for input(s): "CHOL", "HDL", "LDLCALC", "TRIG", "CHOLHDL", "LDLDIRECT" in the last 72 hours. Thyroid function studies: No results for input(s): "TSH", "T4TOTAL", "T3FREE", "THYROIDAB" in the last 72 hours.  Invalid input(s): "FREET3" Anemia work up: No results for input(s): "VITAMINB12", "FOLATE", "FERRITIN", "TIBC", "IRON", "RETICCTPCT" in the last 72 hours. Sepsis Labs: Recent Labs  Lab 09/07/22 0829 09/08/22 0308 09/09/22 0304 09/12/22 0335  WBC 15.2* 15.7* 17.5* 13.9*    Microbiology Recent Results (from the past 240 hour(s))  Aerobic/Anaerobic Culture w Gram Stain (surgical/deep wound)     Status: None   Collection Time: 09/04/22  1:51 PM   Specimen: Soft Tissue, Other  Result Value Ref Range Status   Specimen Description TISSUE LEFT  THUMB  Final   Special Requests REVISION AMPUTATION  Final   Gram Stain   Final    FEW WBC PRESENT,BOTH PMN AND MONONUCLEAR FEW GRAM POSITIVE COCCI IN CLUSTERS RARE GRAM NEGATIVE RODS    Culture   Final    MODERATE METHICILLIN RESISTANT STAPHYLOCOCCUS AUREUS NO ANAEROBES ISOLATED Performed at [facility], [place] [address] [place], [place] [address]    Report Status 09/09/2022 FINAL  Final   Organism ID, Bacteria METHICILLIN RESISTANT STAPHYLOCOCCUS AUREUS  Final      Susceptibility   Methicillin resistant staphylococcus aureus - MIC*    CIPROFLOXACIN >=8 RESISTANT Resistant     ERYTHROMYCIN >=8 RESISTANT Resistant     GENTAMICIN <=0.5 SENSITIVE Sensitive     OXACILLIN >=4 RESISTANT Resistant     TETRACYCLINE <=1 SENSITIVE Sensitive     VANCOMYCIN 1 SENSITIVE Sensitive     TRIMETH/SULFA <=10 SENSITIVE Sensitive     CLINDAMYCIN <=0.25 SENSITIVE Sensitive     RIFAMPIN <=0.5 SENSITIVE Sensitive     Inducible Clindamycin NEGATIVE Sensitive     * MODERATE  METHICILLIN RESISTANT STAPHYLOCOCCUS AUREUS     Medications:    apixaban  5 mg Oral BID   Chlorhexidine Gluconate Cloth  6 each Topical Q0600   Chlorhexidine Gluconate Cloth  6 each Topical Q0600   [START ON 09/14/2022] darbepoetin (ARANESP) injection - DIALYSIS  150 mcg Intravenous Q Thu-HD   furosemide  80 mg Oral BID   insulin aspart  0-24 Units Subcutaneous TID WC   insulin aspart  4 Units Subcutaneous TID WC   insulin detemir  30 Units Subcutaneous Daily   midodrine  10 mg Oral Q M,W,F-HD   pantoprazole  40 mg Oral BID   rosuvastatin  10 mg Oral Daily   sevelamer carbonate  1,600 mg Oral TID WC   Continuous Infusions:  cefTAZidime (FORTAZ)  IV     DAPTOmycin (CUBICIN) 650 mg in sodium chloride 0.9 % IVPB     [START ON 09/15/2022] DAPTOmycin (CUBICIN) 950 mg in sodium chloride 0.9 % IVPB        LOS: 14 days   Charlynne Cousins  [facility]  09/13/2022, 11:02 AM

## 2022-09-13 NOTE — Progress Notes (Signed)
   09/13/22 1208  Pain Assessment  Pain Scale 0-10  Pain Score 0  Hemodialysis Catheter Left Internal jugular Double lumen Permanent (Tunneled)  Placement Date/Time: 08/29/22 1523   Placed prior to admission: No  Serial / Lot #: [id number]  Expiration Date: 03/03/27  Time Out: Correct patient;Correct site;Correct procedure  Maximum sterile barrier precautions: Hand hygiene;Sterile gloves;Cap;L...  Blue Lumen Status Heparin locked;Flushed  Red Lumen Status Heparin locked  Catheter fill volume (Arterial) 2.1 cc  Catheter fill volume (Venous) 2.1  Dressing Type Transparent  Dressing Status Antimicrobial disc in place  Drainage Description None  Post treatment catheter status Capped and Clamped  Neurological  Level of Consciousness Alert  Orientation Level Oriented X4  RUE Motor Strength 2  LUE Motor Strength 2  RLE Motor Strength 3  LLE Motor Strength 3  Respiratory  Respiratory Pattern Regular;Unlabored  Chest Assessment Chest expansion symmetrical  Bilateral Breath Sounds Clear;Diminished  Cardiac  Pulse Regular  Jugular Venous Distention (JVD) No  Vascular  R Radial Pulse +1  L Radial Pulse +1  R Dorsalis Pedis Pulse +1  L Dorsalis Pedis Pulse +1  R Posterior Tibial Pulse +1  L Posterior Tibial Pulse +1  Generalized Edema +1

## 2022-09-13 NOTE — Progress Notes (Signed)
PT Cancellation Note  Patient Details Name: Louis [name] MRN: [id number] DOB: 1946/02/10   Cancelled Treatment:    Reason Eval/Treat Not Completed: Patient at procedure or test/unavailable.  Retry as time and pt allow.   Ramond Dial 09/13/2022, 10:47 AM  Mee Hives, PT PhD Acute Rehab Dept. Number: [place] and [place]

## 2022-09-13 NOTE — Discharge Summary (Signed)
Physician Discharge Summary  Louis [name]:[id number] DOB: 07/10/1946 DOA: 08/28/2022  PCP: Louis Marry, MD  Admit date: 08/28/2022 Discharge date: 09/13/2022  Admitted From: Home Disposition:  SNF  Recommendations for Outpatient Follow-up:  Follow up with orthopedic hand surgery in 1-2 weeks Please obtain BMP/CBC in one week Follow-up with ID in 6 weeks.  Home Health:No  Equipment/Devices:None  Discharge Condition:Stable CODE STATUS:Full Diet recommendation: Heart Healthy  Brief/Interim Summary: 76 y.o. male past medical history of end-stage renal disease on hemodialysis, diabetes mellitus type 2 essential hypertension, with a recent left upper arm AV fistula ligation and placement of left IJ catheter by vascular on 08/29/2032 comes into the ED per family physician recommendation as he has had left hand swelling from 2 days prior to admission, bloody drainage was noted in the ED x-ray of the hand showed extensive soft tissue swelling of the left thumb concerning for osteomyelitis and surgery was consulted patient was started on IV antibiotics  Discharge Diagnoses:  Principal Problem:   Osteomyelitis ([place]) Active Problems:   Essential hypertension   ESRD (end stage renal disease) (HCC)   Hyperlipidemia   CAD (coronary artery disease)   Obesity (BMI 30-39.9)   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 He underwent repeated surgical excision and debridement with amputation of the left thumb on 09/04/2022 ID was consulted recommended IV daptomycin and Fortaz for 6 weeks. They relate their follow-up with him in the clinic on 10/04/2022. He is started having profuse bleeding at the surgical site on 09/07/2022 which is subsided with holding of the Eliquis. Physical therapy evaluated the patient recommended short-term rehab. Orthopedic surgery evaluated the patient recommended to restart Eliquis.  End stage renal disease: Nephrology was consulted he was continued on his regular dialysis day. He will continue midodrine as an outpatient.  Hyperlipidemia: Continue Crestor.  Diabetes mellitus type 2 with hyperglycemia: No changes made to his insulin regimen he will continue current regimen.  Chronic atrial fibrillation: Rate control the changes made to his medication.  Acute blood loss anemia: His hemoglobin dropped from 10-8.4 close likely due to surgery and bleeding from the site bleeding subsided his hemoglobin remained stable after this bleeding was controlled.  Dysphagia: Due to esophageal stricture, there was an aspiration event on 08/31/2022 esophagogram showed distal stenosis of the GI tract. GI was consulted perform an EGD with dilation on 09/05/2022. We will start on Protonix p.o. twice daily and he has had no further episodes.  Chronic hyponatremia: Asymptomatic.  Obesity: Noted.   Discharge Instructions  Discharge Instructions     Diet - low sodium heart healthy   Complete by: As directed    Diet - low sodium heart healthy   Complete by: As directed    Increase activity slowly   Complete by: As directed    Increase activity slowly   Complete by: As directed    No wound care   Complete by: As directed  No wound care   Complete by: As directed       Allergies as of 09/13/2022       Reactions   Hydrocodone Nausea And Vomiting   Other  reaction(s): GI Upset (intolerance) Projectile vomiting   Oxycodone Nausea And Vomiting   Other reaction(s): GI Upset (intolerance), Vomiting (intolerance) Projectile vomiting   Vancomycin Anaphylaxis   ORAL VANCOMYCIN for C diff.   Dacarbazine Other (See Comments)   Unknown reaction   Latex Itching, Rash   Tape Dermatitis, Itching, Rash   Patch used at dialysis        Medication List     STOP taking these medications    AMBULATORY NON FORMULARY MEDICATION   AMBULATORY NON FORMULARY MEDICATION   iron sucrose in sodium chloride 0.9 % 100 mL       TAKE these medications    B-D INS SYR ULTRAFINE 1CC/31G 31G X 5/16" 1 ML Misc Generic drug: Insulin Syringe-Needle U-100   blood glucose meter kit and supplies Use to check blood sugars daily E11.65   cefTAZidime 2 g in sodium chloride 0.9 % 100 mL Inject 2 g into the vein every Monday, Wednesday, and Friday with hemodialysis.   CoQ10 200 MG Caps Take 200 mg by mouth daily.   DAPTOmycin 650 mg in sodium chloride 0.9 % 50 mL Inject 650 mg into the vein as directed. After dialysis on Mondays and Wednesdays.   DAPTOmycin 950 mg in sodium chloride 0.9 % 50 mL Inject 950 mg into the vein every Friday at 6 PM. After dialysis on Fridays. Start taking on: September 15, 2022   Eliquis 5 MG Tabs tablet Generic drug: apixaban Take 1 tablet (5 mg total) by mouth 2 (two) times daily.   FreeStyle Libre 14 Day Reader Kerrin Mo Dx DM E11.22 Check blood sugar 4 times daily.   FreeStyle Libre 14 Day Sensor Misc Dx DM E11.22 Check blood sugar 4 times daily.   furosemide 40 MG tablet Commonly known as: LASIX Take 80 mg by mouth 2 (two) times daily.   HECTOROL IV Doxercalciferol (Hectorol)   HYDROcodone-acetaminophen 7.5-325 MG tablet Commonly known as: NORCO Take 1 tablet by mouth every 6 (six) hours as needed for up to 8 days for moderate pain.   lidocaine-prilocaine cream Commonly known as: EMLA SMARTSIG:sparingly Topical As  Directed   midodrine 10 MG tablet Commonly known as: PROAMATINE Take 10 mg by mouth as directed. Take 30 min. Before dialysis treatment   MIRCERA IJ Mircera   nitroGLYCERIN 0.4 MG SL tablet Commonly known as: NITROSTAT Place 1 tablet (0.4 mg total) under the tongue every 5 (five) minutes as needed for chest pain.   NovoLIN N 100 UNIT/ML injection Generic drug: insulin NPH Human Inject 10 Units into the skin 2 (two) times daily before a meal.   NovoLIN R 100 units/mL injection Generic drug: insulin regular Inject 1-15 Units into the skin 3 (three) times daily with meals. Sliding Scale   omega-3 acid ethyl esters 1 g capsule Commonly known as: LOVAZA Take 1 g by mouth daily.   OneTouch Ultra test strip Generic drug: glucose blood USE 1 STRIP TO CHECK GLUCOSE THREE TIMES DAILY   PROBIOTIC DAILY PO Take 420 mg by mouth daily.   rosuvastatin 40 MG tablet Commonly known as: CRESTOR Take 1 tablet by mouth once daily   silver sulfADIAZINE 1 % cream Commonly known as: SILVADENE Apply 1 Application topically daily as needed (wound care, itching).   Vitamin D3 50 MCG (  2000 UT) Tabs Take 2,000 Units by mouth daily.   VITAMIN E COMPLETE PO Take 1 capsule by mouth daily. Vitamin E complex   ZINC SULFATE PO Take 1 tablet by mouth daily.        Contact information for follow-up providers     Center, [facility].   Why: Schedule is Monday,Wednesday,Friday with with 11:10 chair time.  For first appointment, please arrive at 10:30 to complete paperwork prior to treatment. Contact information: [place] [address] [phone]              Contact information for after-discharge care     Destination     [facility] .   Service: Skilled Nursing Contact information: [address] [address] [phone]                    Allergies  Allergen  Reactions   Hydrocodone Nausea And Vomiting    Other reaction(s): GI Upset (intolerance) Projectile vomiting    Oxycodone Nausea And Vomiting    Other reaction(s): GI Upset (intolerance), Vomiting (intolerance) Projectile vomiting    Vancomycin Anaphylaxis    ORAL VANCOMYCIN for C diff.   Dacarbazine Other (See Comments)    Unknown reaction   Latex Itching and Rash   Tape Dermatitis, Itching and Rash    Patch used at dialysis    Consultations: Orthopedic surgery Gastroenterology   Procedures/Studies: DG ESOPHAGUS W SINGLE CM (SOL OR THIN BA)  Result Date: 09/01/2022 CLINICAL DATA:  Provided history: Dysphagia. Additional history provided: Patient reports feeling as though foods are becoming stuck at the level of the mid chest. Gastroesophageal reflux. History of esophagitis and esophageal erosions. EXAM: ESOPHAGUS/BARIUM SWALLOW/TABLET STUDY TECHNIQUE: A combined double and single contrast examination was performed using effervescent crystals, high-density barium and thin liquid barium. The exam was performed by Brynda Greathouse PA-C, and was supervised and interpreted by Dr. Kellie Simmering. FLUOROSCOPY TIME:  3 minutes, 30 seconds (35.0 mGy). COMPARISON:  None. FINDINGS: Somewhat limited examination due to the patient's inability to stand and limited ability to reposition on the fluoroscopy table. Apparent focal narrowing within the distal esophagus. A swallowed 13 mm barium tablet did not pass beyond this point despite a prolonged period of observation. Elsewhere, the esophagus appears normal in caliber and smooth in contour. Moderate-to-prominent intermittent esophageal dysmotility with tertiary contraction. Small-volume gastroesophageal reflux observed to the level of the lower esophagus. No appreciable hiatal hernia. IMPRESSION: Apparent focal narrowing within the distal esophagus. A swallowed 13 mm barium tablet did not pass beyond this point despite a prolonged period of observation.  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 Moderate-to-prominent intermittent esophageal dysmotility. Small-volume gastroesophageal reflux to the level of the lower esophagus. Electronically Signed   By: Kellie Simmering D.O.   On: 09/01/2022 14:54   VAS [place] UPPER EXTREMITY ARTERIAL DUPLEX  Result Date: 08/31/2022  UPPER EXTREMITY DUPLEX STUDY Patient Name:  Louis [name] Stroble  Date of Exam:   08/31/2022 Medical Rec #: [id number]        Accession #:    [id number] Date of Birth: February 19, 1946        Patient Gender: M Patient Age:   76 years Exam Location:  [facility] Procedure:      VAS [place] UPPER EXTREMITY ARTERIAL DUPLEX Referring Phys: Harrell Gave DICKSON --------------------------------------------------------------------------------  Indications: Bilateral pain, discoloration LT hand >  RT. History:     Patient has a history of post left AVF ligation.  Limitations: Bandaging bilaterally Comparison Study: 08-13-2020 Prior upper extremity arterial limited LEFT                   pre-access Performing Technologist: Darlin Coco RDMS, RVT  Examination Guidelines: A complete evaluation includes B-mode imaging, spectral Doppler, color Doppler, and power Doppler as needed of all accessible portions of each vessel. Bilateral testing is considered an integral part of a complete examination. Limited examinations for reoccurring indications may be performed as noted.  Right Doppler Findings: +---------------+----------+-----------+--------+-------------+ Site           PSV (cm/s)Waveform   StenosisComments      +---------------+----------+-----------+--------+-------------+ Subclavian Dist          triphasic                        +---------------+----------+-----------+--------+-------------+ Brachial Prox  68        triphasic          0.59 diameter +---------------+----------+-----------+--------+-------------+ Brachial Mid    90        triphasic          0.44 diameter +---------------+----------+-----------+--------+-------------+ Brachial Dist  75        triphasic          0.50 diameter +---------------+----------+-----------+--------+-------------+ Radial Prox    61        triphasic          0.20 diameter +---------------+----------+-----------+--------+-------------+ Radial Mid     64        triphasic          0.19 diameter +---------------+----------+-----------+--------+-------------+ Radial Dist    67        triphasic          0.20 diameter +---------------+----------+-----------+--------+-------------+ Ulnar Prox     62        triphasic          0.28 diameter +---------------+----------+-----------+--------+-------------+ Ulnar Mid      80        triphasic          0.20 diameter +---------------+----------+-----------+--------+-------------+ Ulnar Dist     105       triphasic          0.22 diameter +---------------+----------+-----------+--------+-------------+ Palmar Arch    41        multiphasic                      +---------------+----------+-----------+--------+-------------+   Left Doppler Findings: +---------------+----------+-----------+--------+--------+ Site           PSV (cm/s)Waveform   StenosisComments +---------------+----------+-----------+--------+--------+ Subclavian Prox68        triphasic                   +---------------+----------+-----------+--------+--------+ Subclavian Mid 71        triphasic                   +---------------+----------+-----------+--------+--------+ Subclavian Dist65        triphasic                   +---------------+----------+-----------+--------+--------+ Axillary       89        triphasic                   +---------------+----------+-----------+--------+--------+ Brachial Prox  67        triphasic                   +---------------+----------+-----------+--------+--------+  Brachial Mid   91         triphasic                   +---------------+----------+-----------+--------+--------+ Brachial Dist  71        triphasic                   +---------------+----------+-----------+--------+--------+ Radial Prox    113       triphasic                   +---------------+----------+-----------+--------+--------+ Radial Mid     95        triphasic                   +---------------+----------+-----------+--------+--------+ Radial Dist    84        triphasic                   +---------------+----------+-----------+--------+--------+ Ulnar Prox     69        triphasic                   +---------------+----------+-----------+--------+--------+ Ulnar Mid      85        triphasic                   +---------------+----------+-----------+--------+--------+ Ulnar Dist     92        triphasic                   +---------------+----------+-----------+--------+--------+ Palmar Arch    37        multiphasic                 +---------------+----------+-----------+--------+--------+   Summary:  Right: No obstruction visualized in the right upper extremity. Left: No obstruction visualized in the left upper extremity. *See table(s) above for measurements and observations. Electronically signed by Orlie Pollen on 08/31/2022 at 8:12:23 PM.    Final    DG CHEST PORT 1 VIEW  Result Date: 08/31/2022 CLINICAL DATA:  Aspiration pneumonia. EXAM: PORTABLE CHEST 1 VIEW COMPARISON:  08/29/2022 FINDINGS: The left IJ dialysis catheter is stable. Stable borderline cardiac enlargement. Persistent left lower lobe infiltrate and left pleural effusion. IMPRESSION: Persistent left lower lobe infiltrate and left pleural effusion. Electronically Signed   By: Marijo Sanes M.D.   On: 08/31/2022 15:32   DG Chest Port 1 View  Result Date: 08/29/2022 CLINICAL DATA:  Placement of dialysis catheter, difficulty breathing EXAM: PORTABLE CHEST 1 VIEW COMPARISON:  10/27/2019 FINDINGS: There is  interval placement of left IJ dialysis catheter with its tip in right atrium. Transverse diameter of heart is increased. There is increased density in left lower lung fields suggesting possible moderate sized left pleural effusion and underlying atelectasis/pneumonia. Central pulmonary vessels are prominent. Right lung is clear of any focal infiltrates. There is no pneumothorax. IMPRESSION: Tip of dialysis catheter is seen in the region of right atrium. There is no pneumothorax. Cardiomegaly. Moderate left pleural effusion. Possible atelectasis/pneumonia in left mid and left lower lung fields. Central pulmonary vessels are prominent without signs of alveolar pulmonary edema. Electronically Signed   By: Elmer Picker M.D.   On: 08/29/2022 16:44   DG C-Arm 1-60 Min-No Report  Result Date: 08/29/2022 Fluoroscopy was utilized by the requesting physician.  No radiographic interpretation.   DG Hand Complete Right  Result Date: 08/29/2022 CLINICAL DATA:  Osteomyelitis EXAM: RIGHT HAND - COMPLETE 3+ VIEW COMPARISON:  None  Available. FINDINGS: Advanced vascular calcifications are seen within the right hand. There is a superficial wound involving the distal aspect of the ring finger. There are erosive changes involving the subjacent distal tuft of the fourth distal phalanx suggesting osteomyelitis in this location. There are, additionally, probable chronic erosive changes involving the distal tuft of the distal phalanges of the third digit and thumb. This may reflect the sequela of remote or chronic osteomyelitis. Alternatively, vasculopathy such is Raynaud's phenomena or thermal injuries such as frostbite could result in a similar appearance. Superimposed mild degenerative changes are noted. No acute fracture or dislocation. IMPRESSION: Superficial wound involving the distal aspect of the right ring finger with associated erosive changes involving the subjacent distal tuft of the fourth distal phalanx  suggesting osteomyelitis. Additional erosive changes involving the distal tuft of the distal phalanx of the thumb and long finger. See differential considerations above. Peripheral vascular disease. Electronically Signed   By: Fidela Salisbury M.D.   On: 08/29/2022 00:09   DG Finger Thumb Left  Result Date: 08/29/2022 CLINICAL DATA:  Left thumb pain, swelling, throbbing EXAM: LEFT THUMB 2+V COMPARISON:  None Available. FINDINGS: There is extensive soft tissue swelling of the left thumb with foci of subcutaneous gas noted. Superficial ulcer noted involving the distal aspect of the left thumb. There is subjacent erosive changes involving the distal tuft of the distal phalanx in keeping with osteomyelitis in this location. Advanced vascular calcifications are noted. Degenerative changes are noted within the interphalangeal joint and first metacarpophalangeal joint of the thumb. IMPRESSION: Extensive soft tissue swelling of the left thumb with subcutaneous gas and subjacent erosive changes involving the distal tuft of the distal phalanx in keeping with osteomyelitis. Electronically Signed   By: Fidela Salisbury M.D.   On: 08/29/2022 00:04   DG Hand Complete Left  Result Date: 08/29/2022 CLINICAL DATA:  Osteomyelitis EXAM: LEFT HAND - COMPLETE 3+ VIEW COMPARISON:  None Available. FINDINGS: Advanced vascular calcifications are seen within the left hand. There is diffuse soft tissue swelling of the left hand, most severely involving the thumb, the inner eminence, and dorsum of the left hand. There is subcutaneous gas identified within the soft tissues of the thumb. There is a superficial wound involving the distal aspect of the thumb. There is subjacent erosive changes involving the distal tuft of the distal phalanx in keeping with osteomyelitis in this location. Erosive changes are noted involving the distal tuft of the index finger with surrounding soft tissue swelling. No acute fracture or dislocation. Remaining  joint spaces appear preserved. IMPRESSION: 1. Superficial wound involving the distal aspect of the thumb with subjacent erosive changes involving the distal tuft of the distal phalanx in keeping with osteomyelitis. 2. Subcutaneous gas within the soft tissues of the thumb. 3. Erosive changes involving the distal tuft of the index finger with associated soft tissue swelling in keeping with probable osteomyelitis in this location as well. Electronically Signed   By: Fidela Salisbury M.D.   On: 08/29/2022 00:02   (Echo, Carotid, EGD, Colonoscopy, ERCP)    Subjective: No complaints.  Discharge Exam: Vitals:   09/13/22 1100 09/13/22 1130  BP: 109/69 106/74  Pulse: (!) 104 87  Resp: 16 19  Temp:    SpO2: 97% 96%   Vitals:   09/13/22 1006 09/13/22 1030 09/13/22 1100 09/13/22 1130  BP: 109/74 101/73 109/69 106/74  Pulse: 99 (!) 116 (!) 104 87  Resp: (!) _0 Temp:      TempSrc:  SpO2: 99% 97% 97% 96%  Weight:      Height:        General: Pt is alert, awake, not in acute distress Cardiovascular: RRR, S1/S2 +, no rubs, no gallops Respiratory: CTA bilaterally, no wheezing, no rhonchi Abdominal: Soft, NT, ND, bowel sounds + Extremities: no edema, no cyanosis    The results of significant diagnostics from this hospitalization (including imaging, microbiology, ancillary and laboratory) are listed below for reference.     Microbiology: Recent Results (from the past 240 hour(s))  Aerobic/Anaerobic Culture w Gram Stain (surgical/deep wound)     Status: None   Collection Time: 09/04/22  1:51 PM   Specimen: Soft Tissue, Other  Result Value Ref Range Status   Specimen Description TISSUE LEFT THUMB  Final   Special Requests REVISION AMPUTATION  Final   Gram Stain   Final    FEW WBC PRESENT,BOTH PMN AND MONONUCLEAR FEW GRAM POSITIVE COCCI IN CLUSTERS RARE GRAM NEGATIVE RODS    Culture   Final    MODERATE METHICILLIN RESISTANT STAPHYLOCOCCUS AUREUS NO ANAEROBES  ISOLATED Performed at [facility], [place] [address] [place], [place] [address]    Report Status 09/09/2022 FINAL  Final   Organism ID, Bacteria METHICILLIN RESISTANT STAPHYLOCOCCUS AUREUS  Final      Susceptibility   Methicillin resistant staphylococcus aureus - MIC*    CIPROFLOXACIN >=8 RESISTANT Resistant     ERYTHROMYCIN >=8 RESISTANT Resistant     GENTAMICIN <=0.5 SENSITIVE Sensitive     OXACILLIN >=4 RESISTANT Resistant     TETRACYCLINE <=1 SENSITIVE Sensitive     VANCOMYCIN 1 SENSITIVE Sensitive     TRIMETH/SULFA <=10 SENSITIVE Sensitive     CLINDAMYCIN <=0.25 SENSITIVE Sensitive     RIFAMPIN <=0.5 SENSITIVE Sensitive     Inducible Clindamycin NEGATIVE Sensitive     * MODERATE METHICILLIN RESISTANT STAPHYLOCOCCUS AUREUS     Labs: BNP (last 3 results) No results for input(s): "BNP" in the last 8760 hours. Basic Metabolic Panel: Recent Labs  Lab 09/08/22 0308 09/12/22 0335  NA 131* 127*  K 4.0 5.3*  CL 93* 88*  CO2 24 25  GLUCOSE 196* 123*  BUN 40* 65*  CREATININE 4.83* 7.68*  CALCIUM 8.9 9.4  PHOS  --  5.1*   Liver Function Tests: Recent Labs  Lab 09/12/22 0335  ALBUMIN 2.3*   No results for input(s): "LIPASE", "AMYLASE" in the last 168 hours. No results for input(s): "AMMONIA" in the last 168 hours. CBC: Recent Labs  Lab 09/07/22 0829 09/08/22 0308 09/09/22 0304 09/12/22 0335  WBC 15.2* 15.7* 17.5* 13.9*  NEUTROABS 12.4* 12.4* 14.9*  --   HGB 8.3* 8.2* 8.4* 8.1*  HCT 25.6* 26.1* 26.2* 26.5*  MCV 94.8 94.6 95.3 97.4  PLT 204 201 210 210   Cardiac Enzymes: Recent Labs  Lab 09/07/22 0437  CKTOTAL 58   BNP: Invalid input(s): "POCBNP" CBG: Recent Labs  Lab 09/11/22 2027 09/12/22 0727 09/12/22 1434 09/12/22 1557 09/12/22 2233  GLUCAP 148* 110* 114* 150* 162*   D-Dimer No results for input(s): "DDIMER" in the last 72 hours. Hgb A1c No results for input(s): "HGBA1C" in the last 72 hours. Lipid Profile No results for input(s):  "CHOL", "HDL", "LDLCALC", "TRIG", "CHOLHDL", "LDLDIRECT" in the last 72 hours. Thyroid function studies No results for input(s): "TSH", "T4TOTAL", "T3FREE", "THYROIDAB" in the last 72 hours.  Invalid input(s): "FREET3" Anemia work up No results for input(s): "VITAMINB12", "FOLATE", "FERRITIN", "TIBC", "IRON", "RETICCTPCT" in the last 72 hours.  Urinalysis    Component Value Date/Time   COLORURINE YELLOW 02/02/2017 1146   APPEARANCEUR CLEAR 02/02/2017 1146   LABSPEC 1.011 02/02/2017 1146   PHURINE 5.0 02/02/2017 1146   GLUCOSEU NEGATIVE 02/02/2017 1146   HGBUR NEGATIVE 02/02/2017 1146   BILIRUBINUR negative 12/16/2021 1507   BILIRUBINUR neg 01/28/2016 1159   KETONESUR negative 12/16/2021 1507   KETONESUR NEGATIVE 02/02/2017 1146   PROTEINUR 100 (A) 02/02/2017 1146   UROBILINOGEN 0.2 12/16/2021 1507   NITRITE Negative 12/16/2021 1507   NITRITE NEGATIVE 02/02/2017 1146   LEUKOCYTESUR Small (1+) (A) 12/16/2021 1507   Sepsis Labs Recent Labs  Lab 09/07/22 0829 09/08/22 0308 09/09/22 0304 09/12/22 0335  WBC 15.2* 15.7* 17.5* 13.9*   Microbiology Recent Results (from the past 240 hour(s))  Aerobic/Anaerobic Culture w Gram Stain (surgical/deep wound)     Status: None   Collection Time: 09/04/22  1:51 PM   Specimen: Soft Tissue, Other  Result Value Ref Range Status   Specimen Description TISSUE LEFT THUMB  Final   Special Requests REVISION AMPUTATION  Final   Gram Stain   Final    FEW WBC PRESENT,BOTH PMN AND MONONUCLEAR FEW GRAM POSITIVE COCCI IN CLUSTERS RARE GRAM NEGATIVE RODS    Culture   Final    MODERATE METHICILLIN RESISTANT STAPHYLOCOCCUS AUREUS NO ANAEROBES ISOLATED Performed at [facility], [place] [address], [place],  [address]    Report Status 09/09/2022 FINAL  Final   Organism ID, Bacteria METHICILLIN RESISTANT STAPHYLOCOCCUS AUREUS  Final      Susceptibility   Methicillin resistant staphylococcus aureus - MIC*    CIPROFLOXACIN >=8 RESISTANT  Resistant     ERYTHROMYCIN >=8 RESISTANT Resistant     GENTAMICIN <=0.5 SENSITIVE Sensitive     OXACILLIN >=4 RESISTANT Resistant     TETRACYCLINE <=1 SENSITIVE Sensitive     VANCOMYCIN 1 SENSITIVE Sensitive     TRIMETH/SULFA <=10 SENSITIVE Sensitive     CLINDAMYCIN <=0.25 SENSITIVE Sensitive     RIFAMPIN <=0.5 SENSITIVE Sensitive     Inducible Clindamycin NEGATIVE Sensitive     * MODERATE METHICILLIN RESISTANT STAPHYLOCOCCUS AUREUS    SIGNED:   Charlynne Cousins, MD  [facility] 09/13/2022, 11:36 AM Pager   If 7PM-7AM, please contact night-coverage [email or web address] Password [email or web address]

## 2022-09-13 NOTE — Progress Notes (Signed)
Received patient in bed to unit.  Alert and oriented.  Informed consent signed and in chart.   Treatment initiated: 809a Treatment completed: 1208  Patient tolerated well.  Transported back to the room  Alert, without acute distress.  Hand-off given to patient's nurse.   Access used: Yes Access issues: No  Total UF removed:     4L Medication(s) given: Midodrine  Post HD VS: 98.0, 107/74, 89, R/A 100 Post HD weight: 95.2kg   [facility]

## 2022-09-13 NOTE — TOC Transition Note (Signed)
Transition of Care [facility]) - CM/SW Discharge Note   Patient Details  Name: Louis [name] MRN: [id number] Date of Birth: 08/12/1946  [facility]) CM/SW Contact:  Joanne Chars, LCSW Phone Number: 09/13/2022, 2:46 PM   Clinical Narrative:   Pt discharging to [facility] RN call report to [phone].     Final next level of care: Skilled Nursing Facility Barriers to Discharge: Barriers Resolved   Patient Goals and CMS Choice     Choice offered to / list presented to : Patient  Discharge Placement              Patient chooses bed at:  [facility]) Patient to be transferred to facility by: [facility] Name of family member notified: wife [name] Patient and family notified of of transfer: 09/13/22  Discharge Plan and Services   Discharge Planning Services: CM Consult                                 Social Determinants of Health (SDOH) Interventions     Readmission Risk Interventions     No data to display

## 2022-09-13 NOTE — Progress Notes (Signed)
Mobility Specialist Progress Note   09/13/22 1530  Mobility  Activity Stood at bedside  Level of Assistance Maximum assist, patient does 25-49%  Assistive Device Front wheel walker;Other (Comment) Chief Operating Officer)  Activity Response Tolerated well  $Mobility charge 1 Mobility   Received pt in bed c/o fatigue but otherwise agreeable. ModA to get pt EOB d/t limited core strength and constant posterior lean. Pad used to navigate majority of pt's hip movements and maxA use to stand pt from an elevated surface. Pt unable to fully lock knees out but able to stand for ~30 seconds w/o physical assistance. Returned back supine w/ assistance of LE's, placed call bell in reach and made sure all needs met.    Holland Falling Mobility Specialist Acute Rehab Office:  [phone]

## 2022-09-13 NOTE — Progress Notes (Signed)
OT Cancellation Note  Patient Details Name: KAHRON KAUTH MRN: [id number] DOB: 1946/10/06   Cancelled Treatment:    Reason Eval/Treat Not Completed: Patient at procedure or test/ unavailable (Patient off unit for HD.  Will attempt later today as schedule permits.) Lodema Hong, [place]  Office [phone]  Trixie Dredge 09/13/2022, 8:25 AM

## 2022-09-14 ENCOUNTER — Telehealth: Payer: Self-pay

## 2022-09-14 DIAGNOSIS — I480 Paroxysmal atrial fibrillation: Secondary | ICD-10-CM | POA: Diagnosis not present

## 2022-09-14 DIAGNOSIS — N186 End stage renal disease: Secondary | ICD-10-CM | POA: Diagnosis not present

## 2022-09-14 DIAGNOSIS — E785 Hyperlipidemia, unspecified: Secondary | ICD-10-CM | POA: Diagnosis not present

## 2022-09-14 DIAGNOSIS — R1314 Dysphagia, pharyngoesophageal phase: Secondary | ICD-10-CM | POA: Diagnosis not present

## 2022-09-14 DIAGNOSIS — A480 Gas gangrene: Secondary | ICD-10-CM | POA: Diagnosis not present

## 2022-09-14 DIAGNOSIS — I959 Hypotension, unspecified: Secondary | ICD-10-CM | POA: Diagnosis not present

## 2022-09-14 DIAGNOSIS — M549 Dorsalgia, unspecified: Secondary | ICD-10-CM | POA: Diagnosis not present

## 2022-09-14 DIAGNOSIS — Z23 Encounter for immunization: Secondary | ICD-10-CM | POA: Diagnosis not present

## 2022-09-14 DIAGNOSIS — E1122 Type 2 diabetes mellitus with diabetic chronic kidney disease: Secondary | ICD-10-CM | POA: Diagnosis not present

## 2022-09-14 DIAGNOSIS — M86142 Other acute osteomyelitis, left hand: Secondary | ICD-10-CM | POA: Diagnosis not present

## 2022-09-14 DIAGNOSIS — R41841 Cognitive communication deficit: Secondary | ICD-10-CM | POA: Diagnosis not present

## 2022-09-14 DIAGNOSIS — N2581 Secondary hyperparathyroidism of renal origin: Secondary | ICD-10-CM | POA: Diagnosis not present

## 2022-09-14 DIAGNOSIS — R2681 Unsteadiness on feet: Secondary | ICD-10-CM | POA: Diagnosis not present

## 2022-09-14 DIAGNOSIS — I4891 Unspecified atrial fibrillation: Secondary | ICD-10-CM | POA: Diagnosis not present

## 2022-09-14 DIAGNOSIS — D631 Anemia in chronic kidney disease: Secondary | ICD-10-CM | POA: Diagnosis not present

## 2022-09-14 DIAGNOSIS — Z719 Counseling, unspecified: Secondary | ICD-10-CM | POA: Diagnosis not present

## 2022-09-14 DIAGNOSIS — E876 Hypokalemia: Secondary | ICD-10-CM | POA: Diagnosis not present

## 2022-09-14 DIAGNOSIS — M86342 Chronic multifocal osteomyelitis, left hand: Secondary | ICD-10-CM | POA: Diagnosis not present

## 2022-09-14 DIAGNOSIS — E1142 Type 2 diabetes mellitus with diabetic polyneuropathy: Secondary | ICD-10-CM | POA: Diagnosis not present

## 2022-09-14 DIAGNOSIS — M6281 Muscle weakness (generalized): Secondary | ICD-10-CM | POA: Diagnosis not present

## 2022-09-14 DIAGNOSIS — E669 Obesity, unspecified: Secondary | ICD-10-CM | POA: Diagnosis not present

## 2022-09-14 DIAGNOSIS — Z7401 Bed confinement status: Secondary | ICD-10-CM | POA: Diagnosis not present

## 2022-09-14 DIAGNOSIS — Z89012 Acquired absence of left thumb: Secondary | ICD-10-CM | POA: Diagnosis not present

## 2022-09-14 DIAGNOSIS — I10 Essential (primary) hypertension: Secondary | ICD-10-CM | POA: Diagnosis not present

## 2022-09-14 DIAGNOSIS — I2510 Atherosclerotic heart disease of native coronary artery without angina pectoris: Secondary | ICD-10-CM | POA: Diagnosis not present

## 2022-09-14 DIAGNOSIS — D509 Iron deficiency anemia, unspecified: Secondary | ICD-10-CM | POA: Diagnosis not present

## 2022-09-14 DIAGNOSIS — Z992 Dependence on renal dialysis: Secondary | ICD-10-CM | POA: Diagnosis not present

## 2022-09-14 DIAGNOSIS — Z4781 Encounter for orthopedic aftercare following surgical amputation: Secondary | ICD-10-CM | POA: Diagnosis not present

## 2022-09-14 NOTE — Telephone Encounter (Signed)
Patric Dykes at [facility] called asking when the pt needs a f/u appt.  Reviewed pt's chart, returned call for clarification, two identifiers used. Informed her that he was only supposed to f/u with Orthopedics and ID. Confirmed understanding.

## 2022-09-15 ENCOUNTER — Other Ambulatory Visit: Payer: Self-pay | Admitting: *Deleted

## 2022-09-15 DIAGNOSIS — D631 Anemia in chronic kidney disease: Secondary | ICD-10-CM | POA: Diagnosis not present

## 2022-09-15 DIAGNOSIS — Z992 Dependence on renal dialysis: Secondary | ICD-10-CM | POA: Diagnosis not present

## 2022-09-15 DIAGNOSIS — E876 Hypokalemia: Secondary | ICD-10-CM | POA: Diagnosis not present

## 2022-09-15 DIAGNOSIS — N2581 Secondary hyperparathyroidism of renal origin: Secondary | ICD-10-CM | POA: Diagnosis not present

## 2022-09-15 DIAGNOSIS — N186 End stage renal disease: Secondary | ICD-10-CM | POA: Diagnosis not present

## 2022-09-15 DIAGNOSIS — D509 Iron deficiency anemia, unspecified: Secondary | ICD-10-CM | POA: Diagnosis not present

## 2022-09-15 DIAGNOSIS — M86142 Other acute osteomyelitis, left hand: Secondary | ICD-10-CM | POA: Diagnosis not present

## 2022-09-15 NOTE — Patient Outreach (Signed)
Louis [name] recently admitted to [facility]. Screening for potential [facility] care coordination services as benefit of insurance plan and PCP.   Facility site visit to [facility] on 09/14/22. Met with SNF social workers to make aware writer is following for potential [facility] needs. Louis [name] is from home with spouse.   Will continue to follow.   [name], MSN, RN,BSN [place] Acute Care Coordinator [phone] (Direct dial)

## 2022-09-18 DIAGNOSIS — Z992 Dependence on renal dialysis: Secondary | ICD-10-CM | POA: Diagnosis not present

## 2022-09-18 DIAGNOSIS — D631 Anemia in chronic kidney disease: Secondary | ICD-10-CM | POA: Diagnosis not present

## 2022-09-18 DIAGNOSIS — N186 End stage renal disease: Secondary | ICD-10-CM | POA: Diagnosis not present

## 2022-09-18 DIAGNOSIS — M86142 Other acute osteomyelitis, left hand: Secondary | ICD-10-CM | POA: Diagnosis not present

## 2022-09-18 DIAGNOSIS — E876 Hypokalemia: Secondary | ICD-10-CM | POA: Diagnosis not present

## 2022-09-18 DIAGNOSIS — N2581 Secondary hyperparathyroidism of renal origin: Secondary | ICD-10-CM | POA: Diagnosis not present

## 2022-09-19 DIAGNOSIS — N186 End stage renal disease: Secondary | ICD-10-CM | POA: Diagnosis not present

## 2022-09-19 DIAGNOSIS — E1122 Type 2 diabetes mellitus with diabetic chronic kidney disease: Secondary | ICD-10-CM | POA: Diagnosis not present

## 2022-09-19 DIAGNOSIS — Z992 Dependence on renal dialysis: Secondary | ICD-10-CM | POA: Diagnosis not present

## 2022-09-20 DIAGNOSIS — D631 Anemia in chronic kidney disease: Secondary | ICD-10-CM | POA: Diagnosis not present

## 2022-09-20 DIAGNOSIS — E1122 Type 2 diabetes mellitus with diabetic chronic kidney disease: Secondary | ICD-10-CM | POA: Diagnosis not present

## 2022-09-20 DIAGNOSIS — N186 End stage renal disease: Secondary | ICD-10-CM | POA: Diagnosis not present

## 2022-09-20 DIAGNOSIS — N2581 Secondary hyperparathyroidism of renal origin: Secondary | ICD-10-CM | POA: Diagnosis not present

## 2022-09-20 DIAGNOSIS — Z992 Dependence on renal dialysis: Secondary | ICD-10-CM | POA: Diagnosis not present

## 2022-09-20 DIAGNOSIS — M86142 Other acute osteomyelitis, left hand: Secondary | ICD-10-CM | POA: Diagnosis not present

## 2022-09-22 DIAGNOSIS — Z992 Dependence on renal dialysis: Secondary | ICD-10-CM | POA: Diagnosis not present

## 2022-09-22 DIAGNOSIS — D631 Anemia in chronic kidney disease: Secondary | ICD-10-CM | POA: Diagnosis not present

## 2022-09-22 DIAGNOSIS — N2581 Secondary hyperparathyroidism of renal origin: Secondary | ICD-10-CM | POA: Diagnosis not present

## 2022-09-22 DIAGNOSIS — N186 End stage renal disease: Secondary | ICD-10-CM | POA: Diagnosis not present

## 2022-09-22 DIAGNOSIS — M86142 Other acute osteomyelitis, left hand: Secondary | ICD-10-CM | POA: Diagnosis not present

## 2022-09-23 IMAGING — DX DG SHOULDER 2+V*L*
2 series · 2 of 2 positions shown · non-contrast
Comparison: MRI left shoulder this same day.

CLINICAL DATA: Left shoulder pain since a fall 2 weeks ago. Initial
encounter.

EXAM:
LEFT SHOULDER - 2+ VIEW

[shoulder grashey]
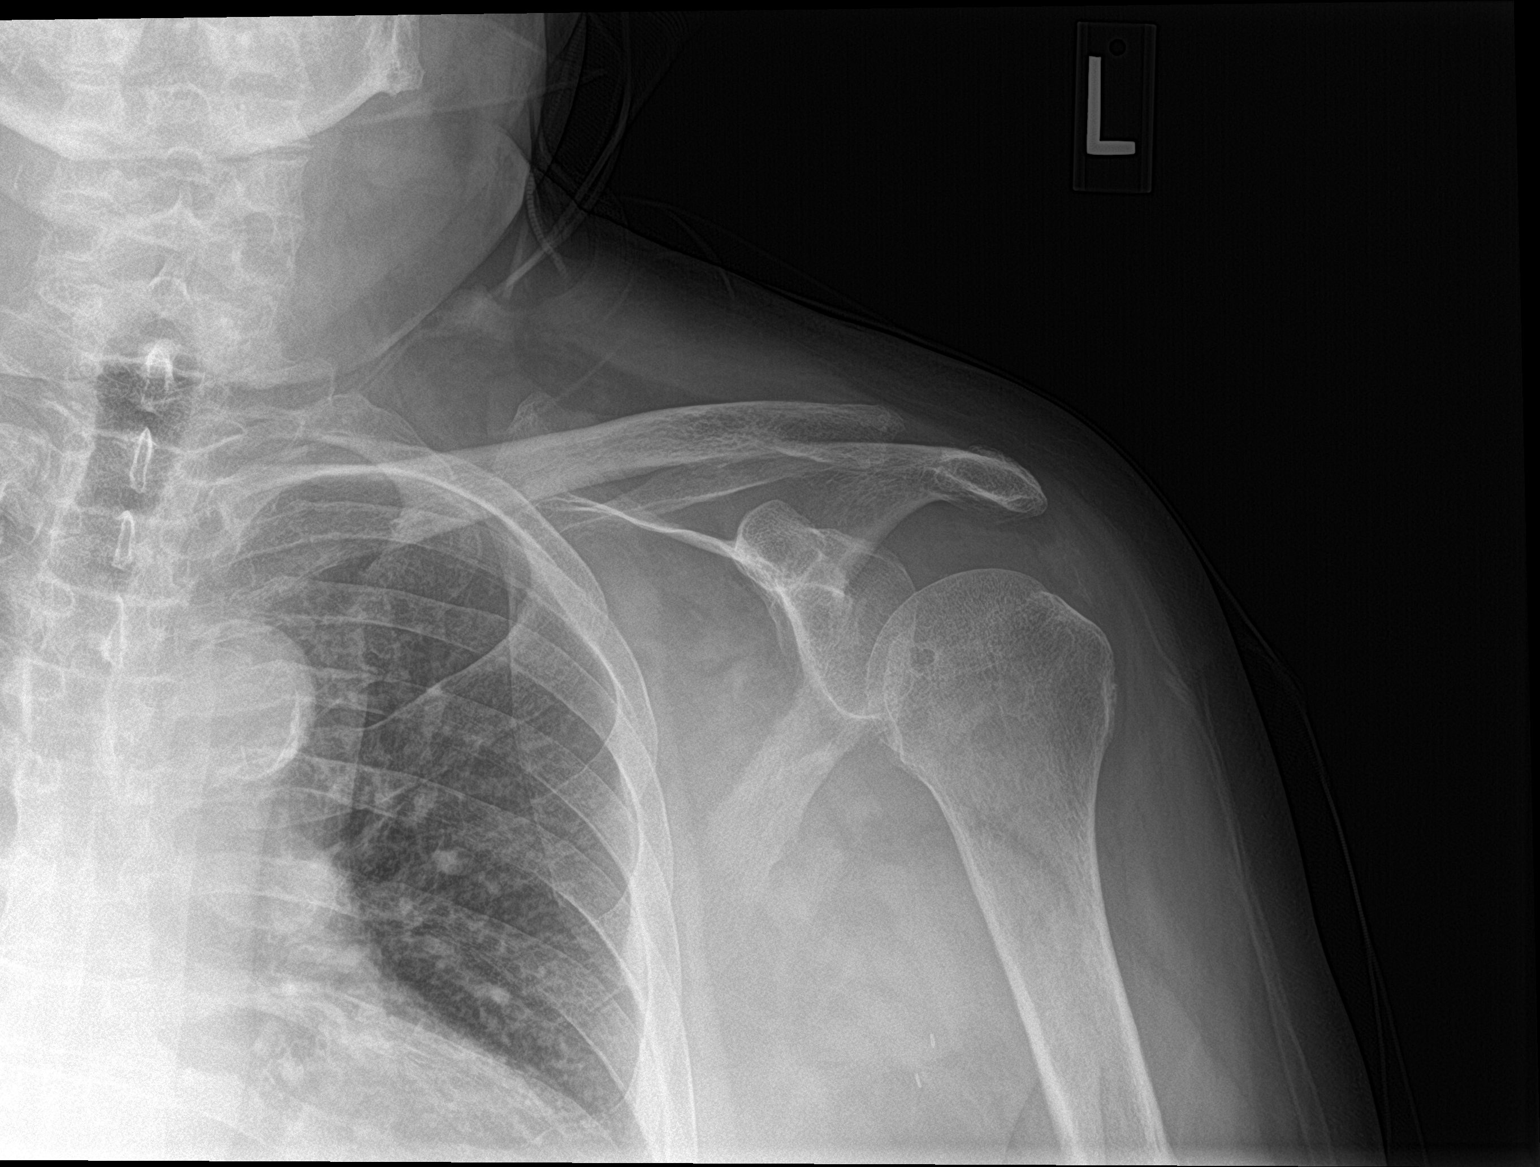

[shoulder y view]
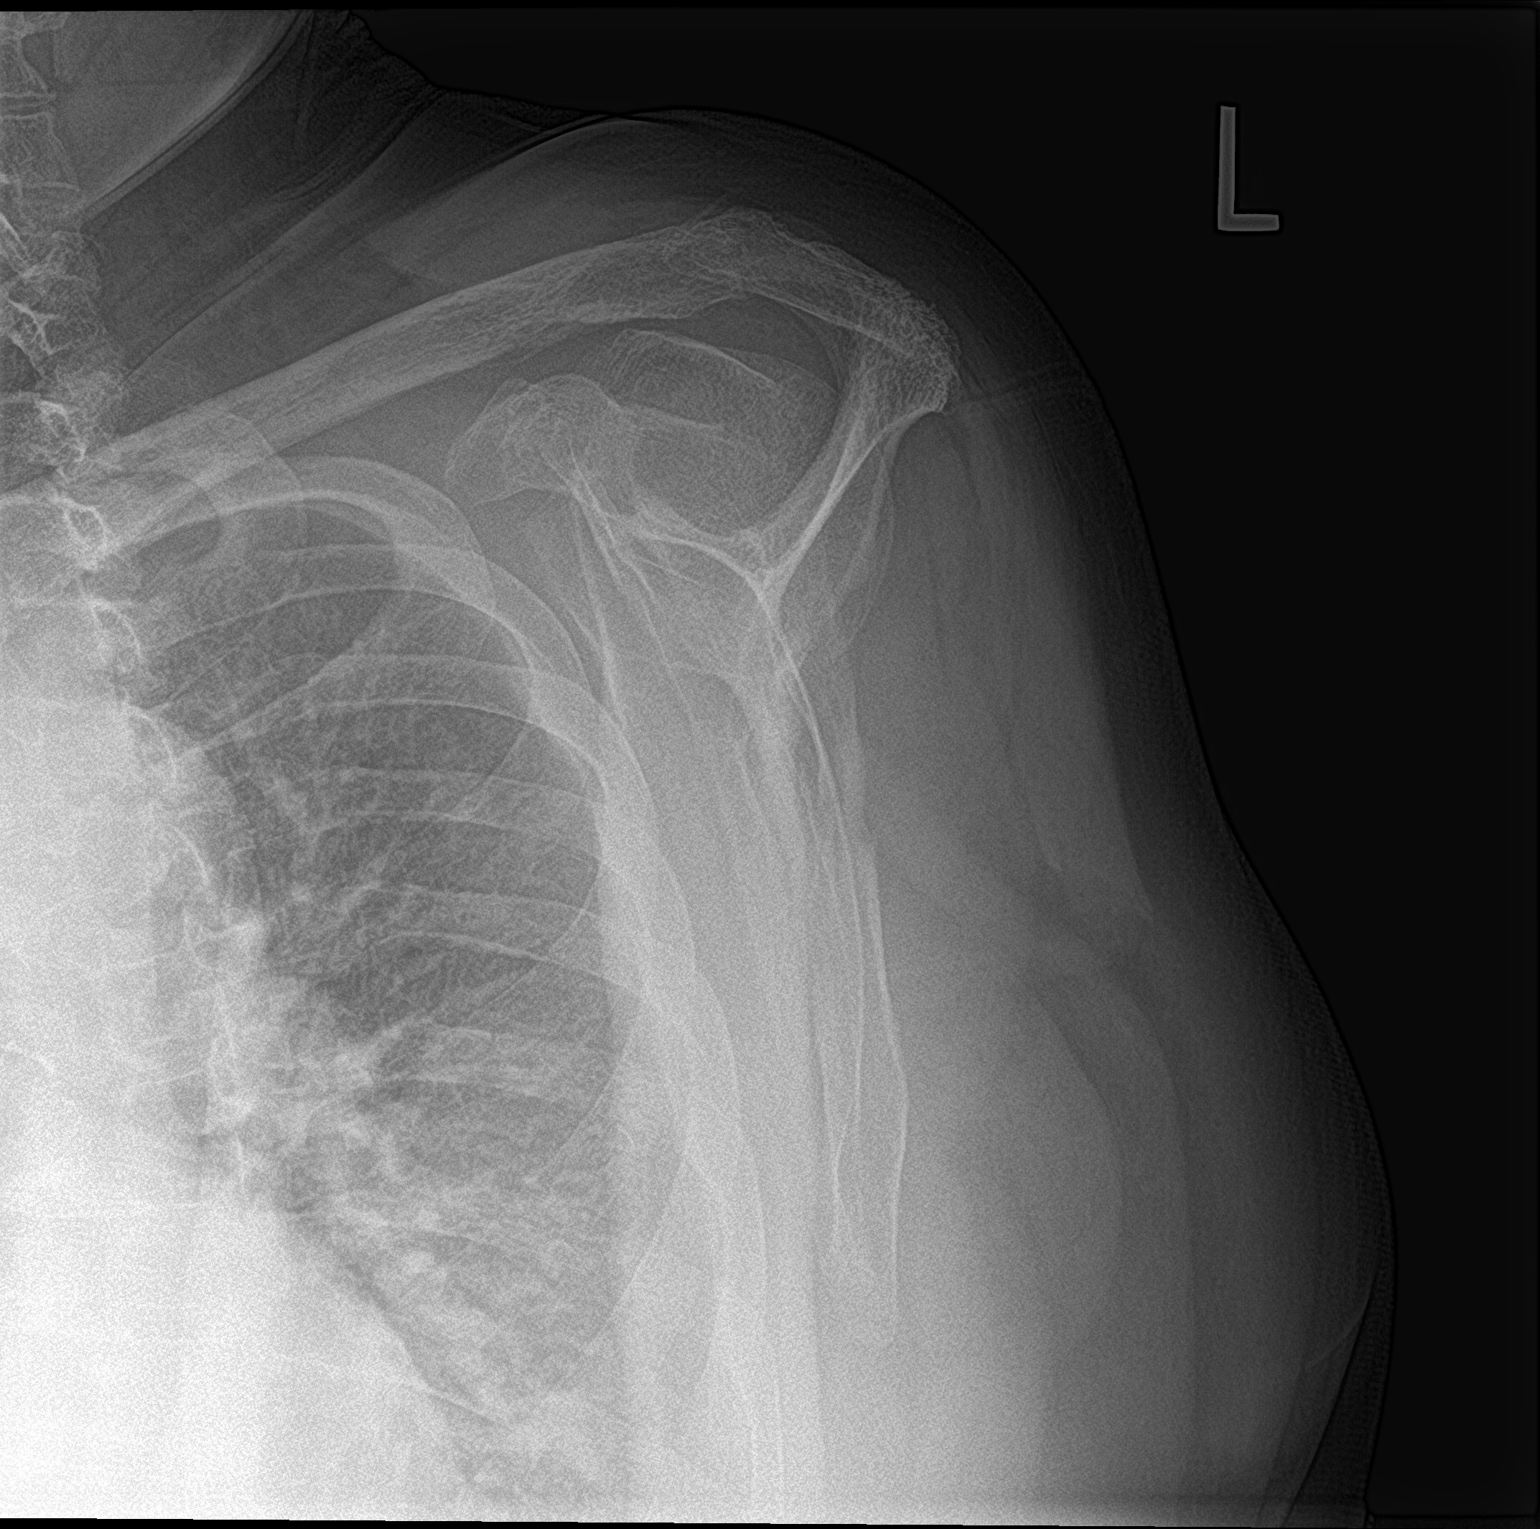

[2 of 2 positions shown; findings below may reference images not displayed]

FINDINGS: Mildly impacted fracture of the head neck junction of the posterior
aspect of the proximal humerus is identified. No other fracture is
seen. The humerus is located. The AC joint has been resected. No
focal bony lesion.
IMPRESSION: Mildly impacted fracture head neck junction of the posterior
humerus. No other acute abnormality.

## 2022-09-23 IMAGING — MR MR SHOULDER*L* W/O CM
6 series · 40 of 40 positions shown · non-contrast
Comparison: MRI left shoulder 08/27/2014. Plain films left shoulder
today.

CLINICAL DATA: Left shoulder pain since a fall 2 weeks ago.

EXAM:
MRI OF THE LEFT SHOULDER WITHOUT CONTRAST
TECHNIQUE: Multiplanar, multisequence MR imaging of the shoulder was performed.
No intravenous contrast was administered.

[Series 3: T2 fat-sat · axial · 4.0mm · 0.59mm/px · z∈[-57,+25]mm · 6 of 21 slices shown (1 of 4)]
[im 1/21]
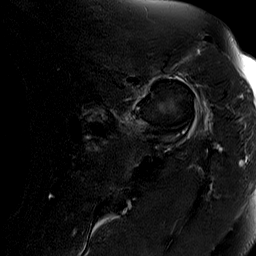
[im 5/21]
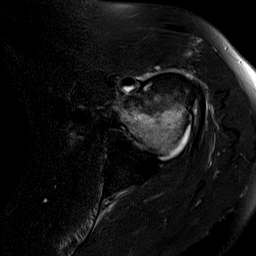
[im 9/21]
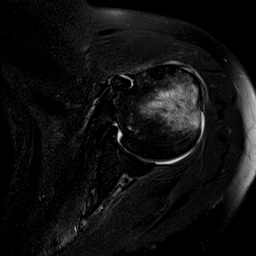
[im 13/21]
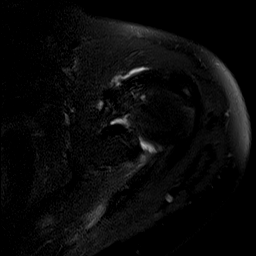
[im 17/21]
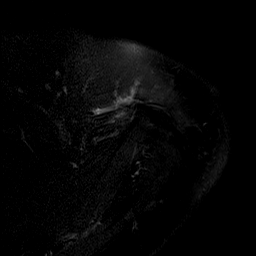
[im 21/21]
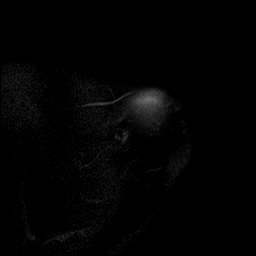

[Series 4: T2 fat-sat · axial · 4.0mm · 0.59mm/px · z∈[-57,+25]mm · 6 of 21 slices shown (2 of 4)]
[im 1/21]
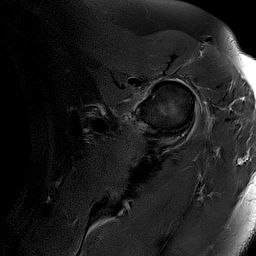
[im 5/21]
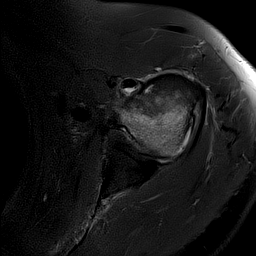
[im 9/21]
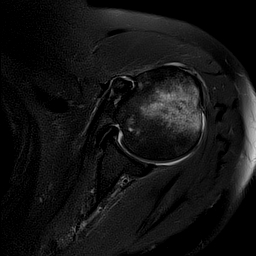
[im 13/21]
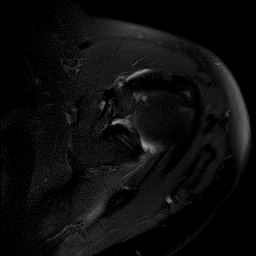
[im 17/21]
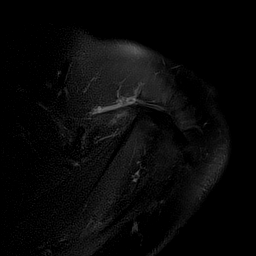
[im 21/21]
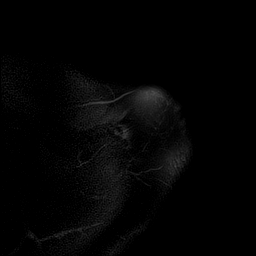

[Series 5: T2 fat-sat · oblique · 4.0mm · 0.62mm/px · 7 of 22 slices shown (3 of 4)]
[im 1/22]
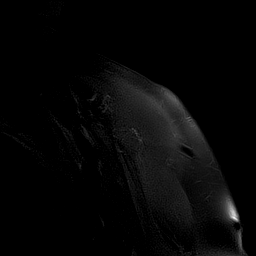
[im 4/22]
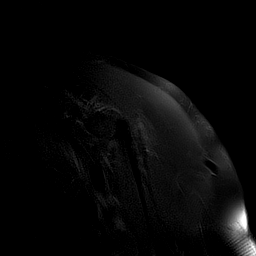
[im 8/22]
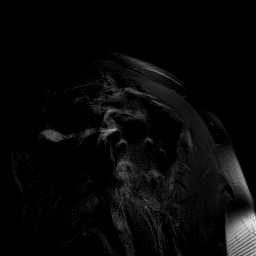
[im 11/22]
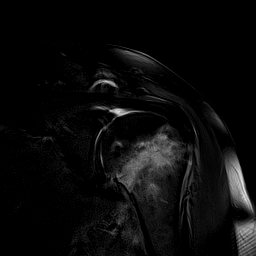
[im 15/22]
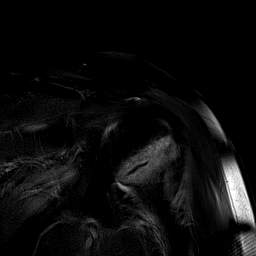
[im 18/22]
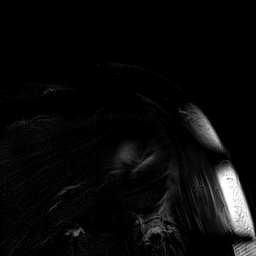
[im 22/22]
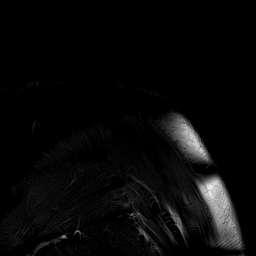

[Series 6: PD fat-sat · oblique · 4.0mm · 0.62mm/px · 7 of 22 slices shown]
[im 1/22]
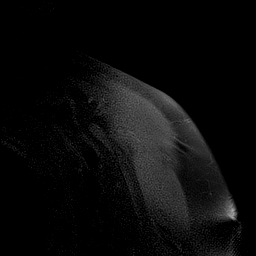
[im 4/22]
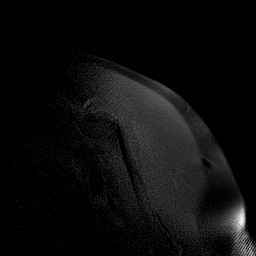
[im 8/22]
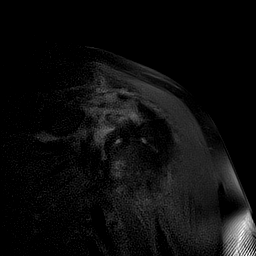
[im 11/22]
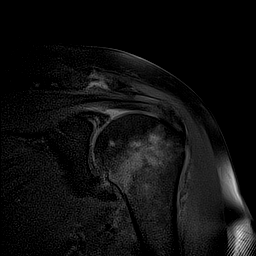
[im 15/22]
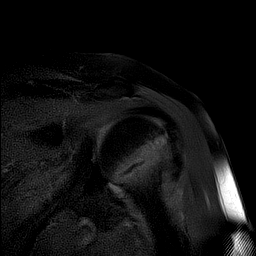
[im 18/22]
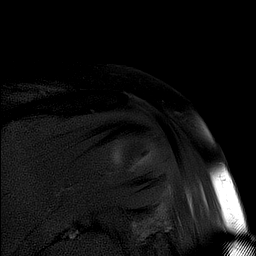
[im 22/22]
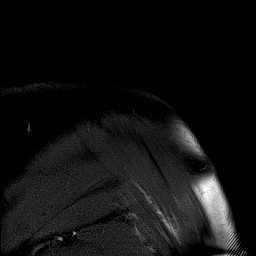

[Series 7: T1 · oblique · 4.0mm · 0.55mm/px · 7 of 22 slices shown]
[im 1/22]
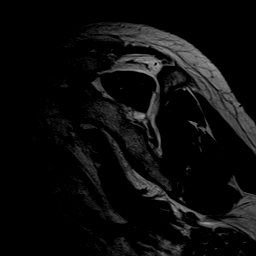
[im 4/22]
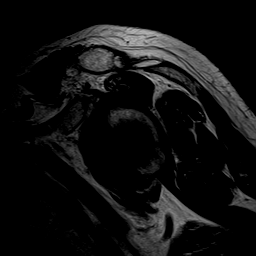
[im 8/22]
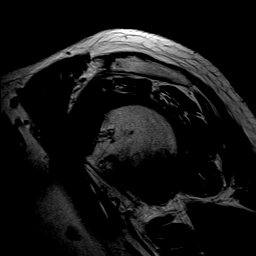
[im 11/22]
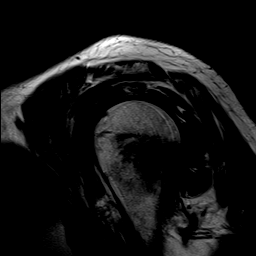
[im 15/22]
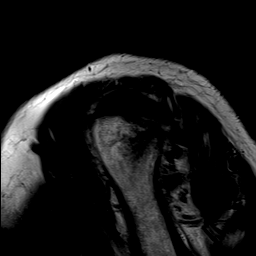
[im 18/22]
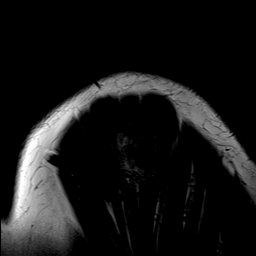
[im 22/22]
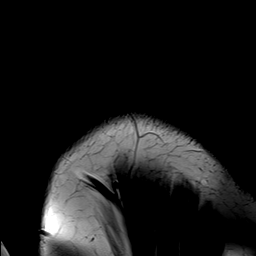

[Series 8: T2 fat-sat · oblique · 4.0mm · 0.55mm/px · 7 of 22 slices shown (4 of 4)]
[im 1/22]
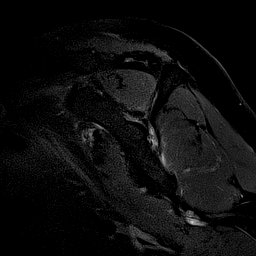
[im 4/22]
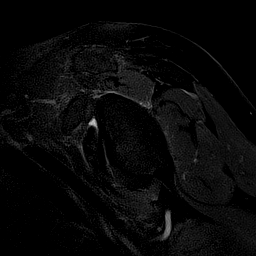
[im 8/22]
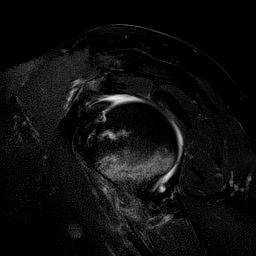
[im 11/22]
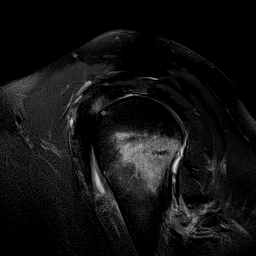
[im 15/22]
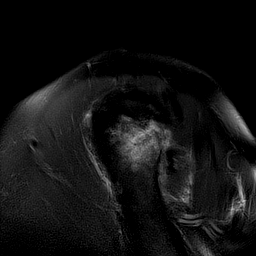
[im 18/22]
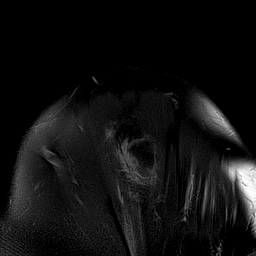
[im 22/22]
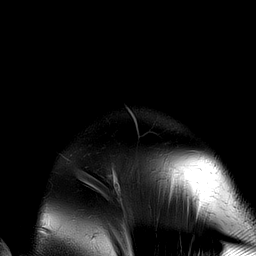

[40 of 40 positions shown; findings below may reference images not displayed]

FINDINGS: Rotator cuff: Intact. There is thickening and heterogeneously
increased T2 signal in the cuff tendons consistent with
tendinopathy, worst in the supraspinatus.

Muscles:  Normal without atrophy or focal lesion.

Biceps long head: Intact. Mild intrasubstance increased T2 signal in
the intra-articular segment is consistent with tendinosis.

Acromioclavicular Joint: The joint has been resected. Type 2
acromion. The patient is status post acromioplasty. No evidence of
bursitis.

Glenohumeral Joint: Negative.

Labrum:  Appears intact.

Bones: The patient has a mildly impacted fracture of the lateral
periphery of the humerus at the head neck junction with associated
marrow edema. No other fracture is identified. No worrisome marrow
lesion.

Other: None.
IMPRESSION: The examination is positive for an acute or early subacute mildly
impacted fracture at the head neck junction of the posterior
humerus.

Rotator cuff and intra-articular long head of biceps tendinosis
without tear.

Status post resection of the AC joint and acromioplasty without
evidence of complication.

## 2022-09-25 DIAGNOSIS — N186 End stage renal disease: Secondary | ICD-10-CM | POA: Diagnosis not present

## 2022-09-25 DIAGNOSIS — N2581 Secondary hyperparathyroidism of renal origin: Secondary | ICD-10-CM | POA: Diagnosis not present

## 2022-09-25 DIAGNOSIS — Z992 Dependence on renal dialysis: Secondary | ICD-10-CM | POA: Diagnosis not present

## 2022-09-25 DIAGNOSIS — M86142 Other acute osteomyelitis, left hand: Secondary | ICD-10-CM | POA: Diagnosis not present

## 2022-09-25 DIAGNOSIS — D631 Anemia in chronic kidney disease: Secondary | ICD-10-CM | POA: Diagnosis not present

## 2022-09-27 DIAGNOSIS — I480 Paroxysmal atrial fibrillation: Secondary | ICD-10-CM | POA: Diagnosis not present

## 2022-09-27 DIAGNOSIS — Z992 Dependence on renal dialysis: Secondary | ICD-10-CM | POA: Diagnosis not present

## 2022-09-27 DIAGNOSIS — M86342 Chronic multifocal osteomyelitis, left hand: Secondary | ICD-10-CM | POA: Diagnosis not present

## 2022-09-27 DIAGNOSIS — D631 Anemia in chronic kidney disease: Secondary | ICD-10-CM | POA: Diagnosis not present

## 2022-09-27 DIAGNOSIS — E1142 Type 2 diabetes mellitus with diabetic polyneuropathy: Secondary | ICD-10-CM | POA: Diagnosis not present

## 2022-09-27 DIAGNOSIS — E785 Hyperlipidemia, unspecified: Secondary | ICD-10-CM | POA: Diagnosis not present

## 2022-09-27 DIAGNOSIS — N2581 Secondary hyperparathyroidism of renal origin: Secondary | ICD-10-CM | POA: Diagnosis not present

## 2022-09-27 DIAGNOSIS — Z89012 Acquired absence of left thumb: Secondary | ICD-10-CM | POA: Diagnosis not present

## 2022-09-27 DIAGNOSIS — M549 Dorsalgia, unspecified: Secondary | ICD-10-CM | POA: Diagnosis not present

## 2022-09-27 DIAGNOSIS — M86142 Other acute osteomyelitis, left hand: Secondary | ICD-10-CM | POA: Diagnosis not present

## 2022-09-27 DIAGNOSIS — A480 Gas gangrene: Secondary | ICD-10-CM | POA: Diagnosis not present

## 2022-09-27 DIAGNOSIS — Z4781 Encounter for orthopedic aftercare following surgical amputation: Secondary | ICD-10-CM | POA: Diagnosis not present

## 2022-09-27 DIAGNOSIS — N186 End stage renal disease: Secondary | ICD-10-CM | POA: Diagnosis not present

## 2022-09-27 DIAGNOSIS — E669 Obesity, unspecified: Secondary | ICD-10-CM | POA: Diagnosis not present

## 2022-09-27 DIAGNOSIS — D509 Iron deficiency anemia, unspecified: Secondary | ICD-10-CM | POA: Diagnosis not present

## 2022-09-27 DIAGNOSIS — I2510 Atherosclerotic heart disease of native coronary artery without angina pectoris: Secondary | ICD-10-CM | POA: Diagnosis not present

## 2022-09-27 DIAGNOSIS — R1314 Dysphagia, pharyngoesophageal phase: Secondary | ICD-10-CM | POA: Diagnosis not present

## 2022-09-28 DIAGNOSIS — I10 Essential (primary) hypertension: Secondary | ICD-10-CM | POA: Diagnosis not present

## 2022-09-28 DIAGNOSIS — M549 Dorsalgia, unspecified: Secondary | ICD-10-CM | POA: Diagnosis not present

## 2022-09-28 DIAGNOSIS — E1142 Type 2 diabetes mellitus with diabetic polyneuropathy: Secondary | ICD-10-CM | POA: Diagnosis not present

## 2022-09-29 DIAGNOSIS — M86142 Other acute osteomyelitis, left hand: Secondary | ICD-10-CM | POA: Diagnosis not present

## 2022-09-29 DIAGNOSIS — D631 Anemia in chronic kidney disease: Secondary | ICD-10-CM | POA: Diagnosis not present

## 2022-09-29 DIAGNOSIS — N186 End stage renal disease: Secondary | ICD-10-CM | POA: Diagnosis not present

## 2022-09-29 DIAGNOSIS — Z992 Dependence on renal dialysis: Secondary | ICD-10-CM | POA: Diagnosis not present

## 2022-09-29 DIAGNOSIS — N2581 Secondary hyperparathyroidism of renal origin: Secondary | ICD-10-CM | POA: Diagnosis not present

## 2022-10-01 ENCOUNTER — Other Ambulatory Visit: Payer: Self-pay | Admitting: Family Medicine

## 2022-10-02 ENCOUNTER — Telehealth: Payer: Self-pay

## 2022-10-02 ENCOUNTER — Telehealth: Payer: Self-pay | Admitting: Infectious Disease

## 2022-10-02 DIAGNOSIS — N186 End stage renal disease: Secondary | ICD-10-CM

## 2022-10-02 DIAGNOSIS — E11610 Type 2 diabetes mellitus with diabetic neuropathic arthropathy: Secondary | ICD-10-CM

## 2022-10-02 NOTE — Telephone Encounter (Signed)
Okay to place order and fax to the home health company that they are using.

## 2022-10-02 NOTE — Telephone Encounter (Signed)
Needing diagnosis to attach  to the hospital bed order then will fax to [facility].

## 2022-10-02 NOTE — Addendum Note (Signed)
Addended by: Rae Lips on: 10/02/2022 05:53 PM   Modules accepted: Orders

## 2022-10-02 NOTE — Telephone Encounter (Signed)
[name], [name], left a VM requesting a call regarding her husband's appt. She stated she was unsure why this appt was made because pt does not have an infectious disease. She would like to know if this appt is needed and why. Pt and wife can be reached at [phone].

## 2022-10-02 NOTE — Telephone Encounter (Signed)
Spoke to patient's wife to let her know patient was seen by ID provider in hospital and our clinic manages his antibiotics. Wife stated that she needs to reschedule appointment and by telephone since patient has a hard time getting in and out of car and she has treatment on the day he is scheduled. Reschedule patient for telephone visit on 11/20.

## 2022-10-02 NOTE — Telephone Encounter (Signed)
Patient wife  states patient got his wheelchair and walker from [facility].  Her home health ( [facility]) does not  do any equipment.

## 2022-10-02 NOTE — Telephone Encounter (Signed)
Order signed. Pls make sure it printed so we can fax so [facility]

## 2022-10-02 NOTE — Addendum Note (Signed)
Addended by: [name] D on: 10/02/2022 05:58 PM   Modules accepted: Orders

## 2022-10-02 NOTE — Telephone Encounter (Signed)
Patient wife calling for patient . He is out of rehab  and states he is in desperate need of a hospital bed. Requesting an order for this from PCP.

## 2022-10-03 ENCOUNTER — Ambulatory Visit: Payer: Medicare Other | Admitting: Orthopedic Surgery

## 2022-10-03 ENCOUNTER — Other Ambulatory Visit: Payer: Self-pay

## 2022-10-03 DIAGNOSIS — N2581 Secondary hyperparathyroidism of renal origin: Secondary | ICD-10-CM | POA: Diagnosis not present

## 2022-10-03 DIAGNOSIS — D631 Anemia in chronic kidney disease: Secondary | ICD-10-CM | POA: Diagnosis not present

## 2022-10-03 DIAGNOSIS — A480 Gas gangrene: Secondary | ICD-10-CM | POA: Diagnosis not present

## 2022-10-03 DIAGNOSIS — D509 Iron deficiency anemia, unspecified: Secondary | ICD-10-CM | POA: Diagnosis not present

## 2022-10-03 DIAGNOSIS — E1122 Type 2 diabetes mellitus with diabetic chronic kidney disease: Secondary | ICD-10-CM | POA: Diagnosis not present

## 2022-10-03 DIAGNOSIS — Z992 Dependence on renal dialysis: Secondary | ICD-10-CM | POA: Diagnosis not present

## 2022-10-03 DIAGNOSIS — N186 End stage renal disease: Secondary | ICD-10-CM | POA: Diagnosis not present

## 2022-10-03 DIAGNOSIS — M869 Osteomyelitis, unspecified: Secondary | ICD-10-CM | POA: Diagnosis not present

## 2022-10-03 NOTE — Telephone Encounter (Signed)
Hospital bed order faxed  to [facility]  at [phone].

## 2022-10-04 ENCOUNTER — Ambulatory Visit: Payer: Medicare Other | Admitting: Infectious Disease

## 2022-10-04 NOTE — Telephone Encounter (Signed)
Wife called stating that [employer] is requiring a complete narrative as to why pt needs the bed. This needs to be faxed to [phone].

## 2022-10-05 ENCOUNTER — Other Ambulatory Visit: Payer: Self-pay | Admitting: *Deleted

## 2022-10-05 ENCOUNTER — Telehealth: Payer: Self-pay | Admitting: *Deleted

## 2022-10-05 DIAGNOSIS — N2581 Secondary hyperparathyroidism of renal origin: Secondary | ICD-10-CM | POA: Diagnosis not present

## 2022-10-05 DIAGNOSIS — E1122 Type 2 diabetes mellitus with diabetic chronic kidney disease: Secondary | ICD-10-CM | POA: Diagnosis not present

## 2022-10-05 DIAGNOSIS — N186 End stage renal disease: Secondary | ICD-10-CM | POA: Diagnosis not present

## 2022-10-05 DIAGNOSIS — Z992 Dependence on renal dialysis: Secondary | ICD-10-CM | POA: Diagnosis not present

## 2022-10-05 DIAGNOSIS — M869 Osteomyelitis, unspecified: Secondary | ICD-10-CM | POA: Diagnosis not present

## 2022-10-05 DIAGNOSIS — A480 Gas gangrene: Secondary | ICD-10-CM | POA: Diagnosis not present

## 2022-10-05 DIAGNOSIS — D509 Iron deficiency anemia, unspecified: Secondary | ICD-10-CM | POA: Diagnosis not present

## 2022-10-05 NOTE — Telephone Encounter (Signed)
Deneau from [facility] called stating that the note for pt's order for the hospital bed will need to be addend to stated that pt requires frequent changes in body re-positioning in ways that cannot be achieved with a bed pillow or wedge to reduce pressure or treat existence of pressure sores. Pt cannot independently move on his own due to being weak and physically unable to move in bed.   Pt requires  the head of the bed to be elevated more than 30 degrees most of the time due to  issues with swallowing.   Please add to the note from when he was seen in July 31,2023

## 2022-10-05 NOTE — Patient Outreach (Signed)
Louis [name] follow up. Louis [name] discharged from [facility] on 09/30/22.  Verified with [facility] SNF social worker, [facility] was arranged for PT/OT/RN services. Louis [name] returned home with spouse.   Telephone call made to Louis [name] (spouse/DPR) [phone]. Patient identifiers confirmed. Louis [name] reports follow up appointment with PCP is 10/11/22 at surgeon appointment on 10/10/22. [facility] was arranged. However, they have not started yet. States she spoke with [employer] clerk and was assured the nurse will contact her tonight to schedule home visit. Louis [name] endorses hospital bed is on the way thru [facility]. States she paid for bariatric transport chair out of pocket. Has other necessary DME in the home. States Louis [name] could benefit from a bath aide. Advised Louis [name] to request bath aide be added when nursing comes for assessment.   Discussed [facility] care coordination follow up. Louis [name] declines [facility] care coordination follow up at this time. She is effectively managing appointments and coordination of care for Louis [name]. Expressed appreciation of the call.    [name], MSN, RN,BSN [name] [phone] (Direct dial)

## 2022-10-06 NOTE — Telephone Encounter (Signed)
I see if Caryl Asp can add to her note from July where he was seen for decubitus ulcers.  That would make the best sense.  And I will have to be addended by her since I did not actually see him that day.

## 2022-10-07 DIAGNOSIS — N186 End stage renal disease: Secondary | ICD-10-CM | POA: Diagnosis not present

## 2022-10-07 DIAGNOSIS — D509 Iron deficiency anemia, unspecified: Secondary | ICD-10-CM | POA: Diagnosis not present

## 2022-10-07 DIAGNOSIS — N2581 Secondary hyperparathyroidism of renal origin: Secondary | ICD-10-CM | POA: Diagnosis not present

## 2022-10-07 DIAGNOSIS — M869 Osteomyelitis, unspecified: Secondary | ICD-10-CM | POA: Diagnosis not present

## 2022-10-07 DIAGNOSIS — A480 Gas gangrene: Secondary | ICD-10-CM | POA: Diagnosis not present

## 2022-10-07 DIAGNOSIS — Z992 Dependence on renal dialysis: Secondary | ICD-10-CM | POA: Diagnosis not present

## 2022-10-09 ENCOUNTER — Ambulatory Visit (INDEPENDENT_AMBULATORY_CARE_PROVIDER_SITE_OTHER): Payer: Medicare Other | Admitting: Infectious Disease

## 2022-10-09 ENCOUNTER — Other Ambulatory Visit: Payer: Self-pay

## 2022-10-09 ENCOUNTER — Encounter: Payer: Self-pay | Admitting: Infectious Disease

## 2022-10-09 DIAGNOSIS — A4902 Methicillin resistant Staphylococcus aureus infection, unspecified site: Secondary | ICD-10-CM | POA: Diagnosis not present

## 2022-10-09 DIAGNOSIS — N2581 Secondary hyperparathyroidism of renal origin: Secondary | ICD-10-CM | POA: Diagnosis not present

## 2022-10-09 DIAGNOSIS — M869 Osteomyelitis, unspecified: Secondary | ICD-10-CM | POA: Diagnosis not present

## 2022-10-09 DIAGNOSIS — A480 Gas gangrene: Secondary | ICD-10-CM | POA: Diagnosis not present

## 2022-10-09 DIAGNOSIS — A0472 Enterocolitis due to Clostridium difficile, not specified as recurrent: Secondary | ICD-10-CM

## 2022-10-09 DIAGNOSIS — M86042 Acute hematogenous osteomyelitis, left hand: Secondary | ICD-10-CM

## 2022-10-09 DIAGNOSIS — M86641 Other chronic osteomyelitis, right hand: Secondary | ICD-10-CM

## 2022-10-09 DIAGNOSIS — D509 Iron deficiency anemia, unspecified: Secondary | ICD-10-CM | POA: Diagnosis not present

## 2022-10-09 DIAGNOSIS — Z992 Dependence on renal dialysis: Secondary | ICD-10-CM

## 2022-10-09 DIAGNOSIS — N186 End stage renal disease: Secondary | ICD-10-CM | POA: Diagnosis not present

## 2022-10-09 HISTORY — DX: Enterocolitis due to Clostridium difficile, not specified as recurrent: A04.72

## 2022-10-09 NOTE — Telephone Encounter (Signed)
Faxed note to [employer].

## 2022-10-09 NOTE — Telephone Encounter (Signed)
Can you add this information to your note?

## 2022-10-09 NOTE — Progress Notes (Signed)
Virtual Visit via Telephone Note  I connected with Louis [name] on 10/09/22 at  3:30 PM EST by telephone and verified that I am speaking with the correct person using two identifiers.  Location: Patient: Home Provider: [facility]   I discussed the limitations, risks, security and privacy concerns of performing an evaluation and management service by telephone and the availability of in person appointments. I also discussed with the patient that there may be a patient responsible charge related to this service. The patient expressed understanding and agreed to proceed.   History of Present Illness:     Louis [name] is a 76 y.o. male with end-stage renal disease on hemodialysis who has had tingling and waning ischemic digits of both hands now with osteomyelitis involving the left great t distal phalanx x humb as well as possible osteomyelitis in the right hand involving of the right thumb as well as the fourth distal phalanx    He is underground ligation of left upper extremity AV fistula to improve blood flow in his hand and now undergone I&D of the left thumb flexor sheath incision and drainage of the bone curettage and debridement of the distal phalanx.   Grown MRSA as well as a nonbeta-lactamase producing haemophilus parainfluenza from cultures  We gave him a 6-week course of vancomycin and ceftazidime.  Pleat antibiotics and is Closely with Dr. Fredna Dow.  His sutures been removed from his finger.  He has wife say his symptoms have improved and his finger is doing well both the one that was operated on the one on the opposite side.  He did have some upset stomach that was going on he was getting antibiotics and still persist now.  He and his wife are very apprehensive about him being on further antibiotics.  He also has a history of C. difficile colitis that they very much want to avoid.  Past Medical History:  Diagnosis Date   Arthritis    Brittle bones    per pt, has soft bones in  right foot/wears boot cast!   CAD (coronary artery disease)    Cancer ([place])    skin cancer on arm   Cataract    Bil/ surg scheduled for right eye 01/18/17/ left eye 02/08/17   Charcot ankle, right 2019   CHF (congestive heart failure) ([place]) 2015   Diabetes mellitus    Type 2   ESRD (end stage renal disease) on dialysis ([place])    [facility]   Heart failure, diastolic ([facility])    History of kidney stones    Hyperlipidemia    Hypertension    Macular degeneration disease    Macular edema 2014   OSA on CPAP    Paroxysmal atrial fibrillation ([place])    Personal history of colonic polyps - adenomas 01/28/2014   Shortness of breath dyspnea    Syncope and collapse     Past Surgical History:  Procedure Laterality Date   AV FISTULA PLACEMENT Left 09/01/2020   Procedure: LEFT ARM ARTERIOVENOUS (AV) FISTULA;  Surgeon: Waynetta Sandy, MD;  Location: [place];  Service: Vascular;  Laterality: Left;   [place] Left 10/27/2020   Procedure: LEFT SECOND STAGE BASILIC VEIN FISTULA TRANSPOSITION;  Surgeon: Waynetta Sandy, MD;  Location: [place];  Service: Vascular;  Laterality: Left;   BIOPSY  09/05/2022   Procedure: BIOPSY;  Surgeon: Ladene Artist, MD;  Location: [facility] ENDOSCOPY;  Service: Gastroenterology;;   CARPAL TUNNEL RELEASE     left  hand   COLONOSCOPY     ESOPHAGOGASTRODUODENOSCOPY (EGD) WITH PROPOFOL N/A 09/05/2022   Procedure: ESOPHAGOGASTRODUODENOSCOPY (EGD) WITH PROPOFOL;  Surgeon: Ladene Artist, MD;  Location: [place];  Service: Gastroenterology;  Laterality: N/A;   I & D EXTREMITY Left 08/30/2022   Procedure: IRRIGATION AND DEBRIDEMENT LEFT THUMB;  Surgeon: Iran Planas, MD;  Location: [place];  Service: Orthopedics;  Laterality: Left;   I & D EXTREMITY Left 09/04/2022   Procedure: IRRIGATION AND DEBRIDEMENT LEFT THUMB POSSIBLE AMPUTATION;  Surgeon: Iran Planas, MD;  Location: [place];  Service: Orthopedics;  Laterality: Left;   INSERTION  OF DIALYSIS CATHETER Left 08/29/2022   Procedure: INSERTION OF DIALYSIS CATHETER;  Surgeon: Angelia Mould, MD;  Location: [place];  Service: Vascular;  Laterality: Left;   INTRAVASCULAR PRESSURE WIRE/FFR STUDY N/A 12/18/2018   Procedure: INTRAVASCULAR PRESSURE WIRE/FFR STUDY;  Surgeon: Wellington Hampshire, MD;  Location: [place] [facility];  Service: Cardiovascular;  Laterality: N/A;   IR FLUORO GUIDE CV LINE RIGHT  12/16/2018   IR US GUIDE VASC ACCESS RIGHT  12/16/2018   KNEE ARTHROSCOPY Right 09/13/2016   [place], Dr. Dorna Leitz   LIGATION OF ARTERIOVENOUS  FISTULA Left 08/29/2022   Procedure: LIGATION OF ARTERIOVENOUS  FISTULA LEFT ARM;  Surgeon: Angelia Mould, MD;  Location: [place];  Service: Vascular;  Laterality: Left;   PILONIDAL CYST EXCISION     RIGHT/LEFT HEART CATH AND CORONARY ANGIOGRAPHY N/A 12/18/2018   Procedure: RIGHT/LEFT HEART CATH AND CORONARY ANGIOGRAPHY;  Surgeon: Wellington Hampshire, MD;  Location: [place] [facility];  Service: Cardiovascular;  Laterality: N/A;   SAVORY DILATION N/A 09/05/2022   Procedure: SAVORY DILATION;  Surgeon: Ladene Artist, MD;  Location: [place];  Service: Gastroenterology;  Laterality: N/A;    Family History  Problem Relation Age of Onset   Diabetes Mother    Kidney disease Mother    Diabetes Father    Coronary artery disease Father    Diabetes Brother    Diabetes Sister    Prostate cancer Maternal Uncle    Diabetes Maternal Grandmother    Cancer Paternal Grandmother        unknown   Heart attack Paternal Grandfather    Colon cancer Neg Hx       Social History   Socioeconomic History   Marital status: Married    Spouse name: Louis [name]   Number of children: 5   Years of education: 12   Highest education level: 12th grade  Occupational History   Occupation: Warehouse    Comment: retired  Tobacco Use   Smoking status: Former    Packs/day: 0.50    Years: 20.00    Total pack years: 10.00    Types:  Cigarettes    Quit date: 03/06/1977    Years since quitting: 45.6   Smokeless tobacco: Never  Vaping Use   Vaping Use: Never used  Substance and Sexual Activity   Alcohol use: No   Drug use: No   Sexual activity: Not Currently  Other Topics Concern   Not on file  Social History Narrative   Lives with his wife. She is his caretaker. They have 18 grandchildren. He enjoys watching sports as he is not able to do much due to neuropathy.   Social Determinants of Health   Financial Resource Strain: Low Risk  (03/27/2022)   Overall Financial Resource Strain (CARDIA)    Difficulty of Paying Living Expenses: Not hard at all  Food Insecurity:  No Food Insecurity (08/29/2022)   Hunger Vital Sign    Worried About Running Out of Food in the Last Year: Never true    Ran Out of Food in the Last Year: Never true  Transportation Needs: No Transportation Needs (08/29/2022)   PRAPARE - Hydrologist (Medical): No    Lack of Transportation (Non-Medical): No  Physical Activity: Inactive (03/27/2022)   Exercise Vital Sign    Days of Exercise per Week: 0 days    Minutes of Exercise per Session: 0 min  Stress: No Stress Concern Present (03/27/2022)   [place]    Feeling of Stress : Not at all  Social Connections: Moderately Integrated (03/27/2022)   Social Connection and Isolation Panel [NHANES]    Frequency of Communication with Friends and Family: Twice a week    Frequency of Social Gatherings with Friends and Family: Once a week    Attends Religious Services: More than 4 times per year    Active Member of [employer] or Organizations: No    Attends Archivist Meetings: Never    Marital Status: Married    Allergies  Allergen Reactions   Hydrocodone Nausea And Vomiting    Other reaction(s): GI Upset (intolerance) Projectile vomiting    Oxycodone Nausea And Vomiting    Other reaction(s): GI Upset  (intolerance), Vomiting (intolerance) Projectile vomiting    Vancomycin Anaphylaxis    ORAL VANCOMYCIN for C diff.   Dacarbazine Other (See Comments)    Unknown reaction   Latex Itching and Rash   Tape Dermatitis, Itching and Rash    Patch used at dialysis     Current Outpatient Medications:    B-D INS SYR ULTRAFINE 1CC/31G 31G X 5/16" 1 ML MISC, , Disp: , Rfl:    blood glucose meter kit and supplies, Use to check blood sugars daily E11.65, Disp: , Rfl:    Cholecalciferol (VITAMIN D3) 50 MCG (2000 UT) TABS, Take 2,000 Units by mouth daily. , Disp: , Rfl:    Coenzyme Q10 (COQ10) 200 MG CAPS, Take 200 mg by mouth daily., Disp: , Rfl:    Continuous Blood Gluc Receiver (FREESTYLE LIBRE 14 DAY READER) DEVI, Dx DM E11.22 Check blood sugar 4 times daily., Disp: 1 each, Rfl: prn   Continuous Blood Gluc Sensor (FREESTYLE LIBRE 14 DAY SENSOR) MISC, Dx DM E11.22 Check blood sugar 4 times daily., Disp: 6 each, Rfl: prn   Doxercalciferol (HECTOROL IV), Doxercalciferol (Hectorol), Disp: , Rfl:    ELIQUIS 5 MG TABS tablet, Take 1 tablet (5 mg total) by mouth 2 (two) times daily., Disp: 180 tablet, Rfl: 1   furosemide (LASIX) 40 MG tablet, TAKE 1 TABLET BY MOUTH ONCE DAILY AS NEEDED, Disp: 30 tablet, Rfl: 0   midodrine (PROAMATINE) 10 MG tablet, Take 10 mg by mouth as directed. Take 30 min. Before dialysis treatment, Disp: , Rfl:    nitroGLYCERIN (NITROSTAT) 0.4 MG SL tablet, Place 1 tablet (0.4 mg total) under the tongue every 5 (five) minutes as needed for chest pain., Disp: 25 tablet, Rfl: 11   NOVOLIN N 100 UNIT/ML injection, Inject 10 Units into the skin 2 (two) times daily before a meal., Disp: , Rfl:    NOVOLIN R 100 UNIT/ML injection, Inject 1-15 Units into the skin 3 (three) times daily with meals. Sliding Scale, Disp: , Rfl: 5   omega-3 acid ethyl esters (LOVAZA) 1 g capsule, Take 1 g by mouth daily.,  Disp: , Rfl:    ONETOUCH ULTRA test strip, USE 1 STRIP TO CHECK GLUCOSE THREE TIMES DAILY,  Disp: 100 each, Rfl: 6   Probiotic Product (PROBIOTIC DAILY PO), Take 420 mg by mouth daily., Disp: , Rfl:    rosuvastatin (CRESTOR) 40 MG tablet, Take 1 tablet by mouth once daily (Patient taking differently: Take 40 mg by mouth daily.), Disp: 90 tablet, Rfl: 3   silver sulfADIAZINE (SILVADENE) 1 % cream, Apply 1 Application topically daily as needed (wound care, itching)., Disp: , Rfl:    Vitamin Mixture (VITAMIN E COMPLETE PO), Take 1 capsule by mouth daily. Vitamin E complex, Disp: , Rfl:    ZINC SULFATE PO, Take 1 tablet by mouth daily. , Disp: , Rfl:    cefTAZidime 2 g in sodium chloride 0.9 % 100 mL, Inject 2 g into the vein every Monday, Wednesday, and Friday with hemodialysis. (Patient not taking: Reported on 10/09/2022), Disp: , Rfl:    DAPTOmycin 650 mg in sodium chloride 0.9 % 50 mL, Inject 650 mg into the vein as directed. After dialysis on Mondays and Wednesdays. (Patient not taking: Reported on 10/09/2022), Disp: , Rfl:    DAPTOmycin 950 mg in sodium chloride 0.9 % 50 mL, Inject 950 mg into the vein every Friday at 6 PM. After dialysis on Fridays. 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  He and his wife want to have him observed off antibiotics and this is reasonable they will follow with Dr. Fredna Dow and if he has any worsening they will let me know.  History of C. difficile colitis: Fortunately this is not occurred now.  5 end-stage renal is on hemodialysis.    Assessment and Plan:   Follow Up Instructions:    I discussed the assessment and treatment plan with the patient. The patient was provided an opportunity to ask questions and all were answered. The patient agreed with the plan and  demonstrated an understanding of the instructions.   The patient was advised to call back or seek an in-person evaluation if the symptoms worsen or if the condition fails to improve as anticipated.  I provided 22  minutes of non-face-to-face time during this encounter.   Alcide Evener, MD

## 2022-10-10 ENCOUNTER — Telehealth: Payer: Self-pay | Admitting: Internal Medicine

## 2022-10-10 ENCOUNTER — Ambulatory Visit: Payer: Medicare Other | Admitting: Family Medicine

## 2022-10-10 NOTE — Telephone Encounter (Signed)
Inbound call for patient spouse wanting to speak with someone about patient being taken off of eliquist. Please advise.

## 2022-10-11 ENCOUNTER — Ambulatory Visit: Payer: Medicare Other | Admitting: Family Medicine

## 2022-10-11 ENCOUNTER — Telehealth: Payer: Self-pay | Admitting: Cardiology

## 2022-10-11 DIAGNOSIS — Z992 Dependence on renal dialysis: Secondary | ICD-10-CM | POA: Diagnosis not present

## 2022-10-11 DIAGNOSIS — M869 Osteomyelitis, unspecified: Secondary | ICD-10-CM | POA: Diagnosis not present

## 2022-10-11 DIAGNOSIS — N186 End stage renal disease: Secondary | ICD-10-CM | POA: Diagnosis not present

## 2022-10-11 DIAGNOSIS — A480 Gas gangrene: Secondary | ICD-10-CM | POA: Diagnosis not present

## 2022-10-11 DIAGNOSIS — N2581 Secondary hyperparathyroidism of renal origin: Secondary | ICD-10-CM | POA: Diagnosis not present

## 2022-10-11 DIAGNOSIS — D509 Iron deficiency anemia, unspecified: Secondary | ICD-10-CM | POA: Diagnosis not present

## 2022-10-11 NOTE — Telephone Encounter (Signed)
Spoke with pt wife, aware if he is bleeding high up in the digestive tract she will not see red blood. The dark stools maybe from a bleed in the top pf the digestive tract and will make the stool dark and tarry. She voiced understanding and wait for the lab results.

## 2022-10-11 NOTE — Telephone Encounter (Signed)
  [name] calling back and would like to speak with Hilda Blades again. She said, she feels like pt needs to start eliquis and if she notice active bleeding she will call 911

## 2022-10-11 NOTE — Telephone Encounter (Signed)
The key is to see where the hemoglobin is - if stable would not do anything different than already recommended and stay off Pepto Bismol and see if stools clear up as they should  If unable to get Hgb at dialysis would need to get checked at ED or an urgent care

## 2022-10-11 NOTE — Telephone Encounter (Signed)
Spoke to pt wife Louis [name] stated that pt  recently had to have a fistula repair and and then admitted to the Hospital shortly thereafter due to his hand in which part of his big finger was amputated: Pt was in the ED from 08/28/2022 till 09/13/2022  then discharged to Rehab for several weeks: Pt is at home now: Pt wife stated that she noticed that he had dark stools in the rehab and her concern is that they have gotten darker in the last several days: Pt was given Pepto Bismol and probiotics  in these last several days due to family members trying to assist with abdominal pain: [name] notified that Pepto bismol does make the stool dark:  Pt was on IV antibiotics and then on oral antibiotics and family thought the abdominal pain may have been coming from all the antibiotics:  [name] stated that dialysis has checked his labs and recent Hgb 10/05/2022 9.6 10/03/2022 9.6 09/18/2022 9.1 [name] stated that she contacted her PCP who stated that the pt should stop the Eliquis and contact his GI doctor: Pt is on Eliquis for Afibb: [name] was notified that my recommendation was  to contact the pt cardiologist before stopping the Eliquis:  Pt has no symptoms from dark stools, no SOB, dizzyness, lightheadedness, weakness. [name] stated that pt is getting stronger:  [name] stated that pt is at dialysis was currently at Dialysis: [name] was encouraged to call dialysis and request that they get an Hgb on Pt.  Please advise

## 2022-10-11 NOTE — Telephone Encounter (Addendum)
Spoke with pt wife, hgb 9.6 10/03/22, 09/18/22 9.1. per dialysis. I spoke with nurse at the dialysis center and they are not able to run stat labs and do not expect the labs drawn today to be resulted until Monday. Advised patient and wife that he would need to go to the ER to get labs drawn for [place] to determine if he can safely. The patient refused to go to the ER and he voiced understanding of the greater chance of stroke being off the eliquis. They are going to call the dialysis center on Friday to see if the HGB is resulted. They are aware or office is closed and will reach out to the person on call if needed. Dr Stanford Breed is aware of above.

## 2022-10-11 NOTE — Telephone Encounter (Signed)
Called patient, spoke with wife- she states that her husband has been having black stools for the last few days- he does have abdominal pain, they have reached out to PCP (who advised to stop Eliquis) and then they contacted GI (note in epic, which is what patient explained to me on the phone as well) they were concerned with the stopping of Eliquis and asked that they contact us.   Patient did not take dose of eliquis last night or this morning.   They assumed his GI issues were related to all the strong abx he has been on recently as well for other health concerns.   I advised I would reach out to MD to review.  Thanks!

## 2022-10-11 NOTE — Telephone Encounter (Signed)
Pt spouse states that his stool was black and he consulted with his PCP. They advised him to hold Eliquis and they also spoke to his gastro DR and they were concerned about him holding his Eliqiuis. Please advise

## 2022-10-11 NOTE — Telephone Encounter (Signed)
Pt wife [name] made aware of Dr. Carlean Purl recommendations: [name] verbalized understanding with all questions answered.

## 2022-10-14 ENCOUNTER — Inpatient Hospital Stay (HOSPITAL_COMMUNITY)
Admission: EM | Admit: 2022-10-14 | Discharge: 2022-10-20 | DRG: 871 | Disposition: E | Payer: Medicare Other | Attending: Critical Care Medicine | Admitting: Critical Care Medicine

## 2022-10-14 ENCOUNTER — Other Ambulatory Visit: Payer: Self-pay

## 2022-10-14 ENCOUNTER — Encounter (HOSPITAL_COMMUNITY): Payer: Self-pay | Admitting: Emergency Medicine

## 2022-10-14 ENCOUNTER — Emergency Department (HOSPITAL_COMMUNITY): Payer: Medicare Other

## 2022-10-14 DIAGNOSIS — E872 Acidosis, unspecified: Secondary | ICD-10-CM | POA: Diagnosis not present

## 2022-10-14 DIAGNOSIS — E11610 Type 2 diabetes mellitus with diabetic neuropathic arthropathy: Secondary | ICD-10-CM | POA: Diagnosis present

## 2022-10-14 DIAGNOSIS — Z515 Encounter for palliative care: Secondary | ICD-10-CM | POA: Diagnosis not present

## 2022-10-14 DIAGNOSIS — L8990 Pressure ulcer of unspecified site, unspecified stage: Secondary | ICD-10-CM | POA: Diagnosis present

## 2022-10-14 DIAGNOSIS — I2510 Atherosclerotic heart disease of native coronary artery without angina pectoris: Secondary | ICD-10-CM | POA: Diagnosis present

## 2022-10-14 DIAGNOSIS — I6503 Occlusion and stenosis of bilateral vertebral arteries: Secondary | ICD-10-CM | POA: Diagnosis not present

## 2022-10-14 DIAGNOSIS — E11311 Type 2 diabetes mellitus with unspecified diabetic retinopathy with macular edema: Secondary | ICD-10-CM | POA: Diagnosis present

## 2022-10-14 DIAGNOSIS — E871 Hypo-osmolality and hyponatremia: Secondary | ICD-10-CM | POA: Diagnosis present

## 2022-10-14 DIAGNOSIS — M869 Osteomyelitis, unspecified: Secondary | ICD-10-CM | POA: Diagnosis not present

## 2022-10-14 DIAGNOSIS — I3139 Other pericardial effusion (noninflammatory): Secondary | ICD-10-CM | POA: Diagnosis not present

## 2022-10-14 DIAGNOSIS — R531 Weakness: Secondary | ICD-10-CM

## 2022-10-14 DIAGNOSIS — R579 Shock, unspecified: Secondary | ICD-10-CM | POA: Diagnosis not present

## 2022-10-14 DIAGNOSIS — Z8249 Family history of ischemic heart disease and other diseases of the circulatory system: Secondary | ICD-10-CM

## 2022-10-14 DIAGNOSIS — G9341 Metabolic encephalopathy: Secondary | ICD-10-CM | POA: Diagnosis not present

## 2022-10-14 DIAGNOSIS — Z885 Allergy status to narcotic agent status: Secondary | ICD-10-CM

## 2022-10-14 DIAGNOSIS — K257 Chronic gastric ulcer without hemorrhage or perforation: Secondary | ICD-10-CM | POA: Diagnosis present

## 2022-10-14 DIAGNOSIS — Z87891 Personal history of nicotine dependence: Secondary | ICD-10-CM

## 2022-10-14 DIAGNOSIS — Z7901 Long term (current) use of anticoagulants: Secondary | ICD-10-CM

## 2022-10-14 DIAGNOSIS — Z1152 Encounter for screening for COVID-19: Secondary | ICD-10-CM

## 2022-10-14 DIAGNOSIS — Q780 Osteogenesis imperfecta: Secondary | ICD-10-CM | POA: Diagnosis not present

## 2022-10-14 DIAGNOSIS — N2581 Secondary hyperparathyroidism of renal origin: Secondary | ICD-10-CM | POA: Diagnosis not present

## 2022-10-14 DIAGNOSIS — Z87442 Personal history of urinary calculi: Secondary | ICD-10-CM

## 2022-10-14 DIAGNOSIS — I959 Hypotension, unspecified: Secondary | ICD-10-CM | POA: Diagnosis not present

## 2022-10-14 DIAGNOSIS — Z89012 Acquired absence of left thumb: Secondary | ICD-10-CM

## 2022-10-14 DIAGNOSIS — I700 Atherosclerosis of aorta: Secondary | ICD-10-CM | POA: Diagnosis not present

## 2022-10-14 DIAGNOSIS — E8779 Other fluid overload: Secondary | ICD-10-CM | POA: Diagnosis not present

## 2022-10-14 DIAGNOSIS — I132 Hypertensive heart and chronic kidney disease with heart failure and with stage 5 chronic kidney disease, or end stage renal disease: Secondary | ICD-10-CM | POA: Diagnosis present

## 2022-10-14 DIAGNOSIS — I2699 Other pulmonary embolism without acute cor pulmonale: Secondary | ICD-10-CM | POA: Diagnosis present

## 2022-10-14 DIAGNOSIS — A480 Gas gangrene: Secondary | ICD-10-CM | POA: Diagnosis not present

## 2022-10-14 DIAGNOSIS — N281 Cyst of kidney, acquired: Secondary | ICD-10-CM | POA: Diagnosis not present

## 2022-10-14 DIAGNOSIS — Z91040 Latex allergy status: Secondary | ICD-10-CM

## 2022-10-14 DIAGNOSIS — Z9842 Cataract extraction status, left eye: Secondary | ICD-10-CM

## 2022-10-14 DIAGNOSIS — J918 Pleural effusion in other conditions classified elsewhere: Secondary | ICD-10-CM | POA: Diagnosis present

## 2022-10-14 DIAGNOSIS — R4701 Aphasia: Secondary | ICD-10-CM | POA: Diagnosis not present

## 2022-10-14 DIAGNOSIS — Z6835 Body mass index (BMI) 35.0-35.9, adult: Secondary | ICD-10-CM

## 2022-10-14 DIAGNOSIS — R195 Other fecal abnormalities: Secondary | ICD-10-CM | POA: Diagnosis not present

## 2022-10-14 DIAGNOSIS — E669 Obesity, unspecified: Secondary | ICD-10-CM | POA: Diagnosis not present

## 2022-10-14 DIAGNOSIS — Z833 Family history of diabetes mellitus: Secondary | ICD-10-CM

## 2022-10-14 DIAGNOSIS — I4891 Unspecified atrial fibrillation: Secondary | ICD-10-CM | POA: Diagnosis not present

## 2022-10-14 DIAGNOSIS — J690 Pneumonitis due to inhalation of food and vomit: Secondary | ICD-10-CM | POA: Diagnosis present

## 2022-10-14 DIAGNOSIS — K5900 Constipation, unspecified: Secondary | ICD-10-CM | POA: Diagnosis present

## 2022-10-14 DIAGNOSIS — Z881 Allergy status to other antibiotic agents status: Secondary | ICD-10-CM

## 2022-10-14 DIAGNOSIS — R109 Unspecified abdominal pain: Secondary | ICD-10-CM | POA: Diagnosis not present

## 2022-10-14 DIAGNOSIS — I6523 Occlusion and stenosis of bilateral carotid arteries: Secondary | ICD-10-CM | POA: Diagnosis not present

## 2022-10-14 DIAGNOSIS — I5032 Chronic diastolic (congestive) heart failure: Secondary | ICD-10-CM | POA: Diagnosis not present

## 2022-10-14 DIAGNOSIS — Z992 Dependence on renal dialysis: Secondary | ICD-10-CM | POA: Diagnosis not present

## 2022-10-14 DIAGNOSIS — E1151 Type 2 diabetes mellitus with diabetic peripheral angiopathy without gangrene: Secondary | ICD-10-CM | POA: Diagnosis present

## 2022-10-14 DIAGNOSIS — L89152 Pressure ulcer of sacral region, stage 2: Secondary | ICD-10-CM | POA: Diagnosis present

## 2022-10-14 DIAGNOSIS — R4182 Altered mental status, unspecified: Secondary | ICD-10-CM | POA: Diagnosis not present

## 2022-10-14 DIAGNOSIS — N186 End stage renal disease: Secondary | ICD-10-CM | POA: Diagnosis present

## 2022-10-14 DIAGNOSIS — Z79899 Other long term (current) drug therapy: Secondary | ICD-10-CM

## 2022-10-14 DIAGNOSIS — K219 Gastro-esophageal reflux disease without esophagitis: Secondary | ICD-10-CM | POA: Diagnosis present

## 2022-10-14 DIAGNOSIS — R6521 Severe sepsis with septic shock: Secondary | ICD-10-CM | POA: Diagnosis not present

## 2022-10-14 DIAGNOSIS — R000 Tachycardia, unspecified: Secondary | ICD-10-CM | POA: Diagnosis not present

## 2022-10-14 DIAGNOSIS — E1165 Type 2 diabetes mellitus with hyperglycemia: Secondary | ICD-10-CM | POA: Diagnosis present

## 2022-10-14 DIAGNOSIS — I9589 Other hypotension: Secondary | ICD-10-CM | POA: Diagnosis not present

## 2022-10-14 DIAGNOSIS — D631 Anemia in chronic kidney disease: Secondary | ICD-10-CM | POA: Diagnosis not present

## 2022-10-14 DIAGNOSIS — J90 Pleural effusion, not elsewhere classified: Secondary | ICD-10-CM | POA: Diagnosis not present

## 2022-10-14 DIAGNOSIS — Z66 Do not resuscitate: Secondary | ICD-10-CM | POA: Diagnosis not present

## 2022-10-14 DIAGNOSIS — Z9841 Cataract extraction status, right eye: Secondary | ICD-10-CM

## 2022-10-14 DIAGNOSIS — R110 Nausea: Secondary | ICD-10-CM | POA: Diagnosis not present

## 2022-10-14 DIAGNOSIS — E8770 Fluid overload, unspecified: Secondary | ICD-10-CM

## 2022-10-14 DIAGNOSIS — Z91048 Other nonmedicinal substance allergy status: Secondary | ICD-10-CM

## 2022-10-14 DIAGNOSIS — Z8042 Family history of malignant neoplasm of prostate: Secondary | ICD-10-CM

## 2022-10-14 DIAGNOSIS — E1122 Type 2 diabetes mellitus with diabetic chronic kidney disease: Secondary | ICD-10-CM | POA: Diagnosis present

## 2022-10-14 DIAGNOSIS — R748 Abnormal levels of other serum enzymes: Secondary | ICD-10-CM | POA: Diagnosis present

## 2022-10-14 DIAGNOSIS — A419 Sepsis, unspecified organism: Principal | ICD-10-CM | POA: Diagnosis present

## 2022-10-14 DIAGNOSIS — E785 Hyperlipidemia, unspecified: Secondary | ICD-10-CM | POA: Diagnosis present

## 2022-10-14 DIAGNOSIS — Z841 Family history of disorders of kidney and ureter: Secondary | ICD-10-CM

## 2022-10-14 DIAGNOSIS — R079 Chest pain, unspecified: Secondary | ICD-10-CM | POA: Diagnosis not present

## 2022-10-14 DIAGNOSIS — G4733 Obstructive sleep apnea (adult) (pediatric): Secondary | ICD-10-CM | POA: Diagnosis present

## 2022-10-14 DIAGNOSIS — R112 Nausea with vomiting, unspecified: Secondary | ICD-10-CM | POA: Diagnosis not present

## 2022-10-14 DIAGNOSIS — I4821 Permanent atrial fibrillation: Secondary | ICD-10-CM | POA: Diagnosis present

## 2022-10-14 DIAGNOSIS — I120 Hypertensive chronic kidney disease with stage 5 chronic kidney disease or end stage renal disease: Secondary | ICD-10-CM | POA: Diagnosis not present

## 2022-10-14 DIAGNOSIS — M546 Pain in thoracic spine: Secondary | ICD-10-CM | POA: Diagnosis not present

## 2022-10-14 DIAGNOSIS — R0689 Other abnormalities of breathing: Secondary | ICD-10-CM | POA: Diagnosis not present

## 2022-10-14 DIAGNOSIS — Z85828 Personal history of other malignant neoplasm of skin: Secondary | ICD-10-CM

## 2022-10-14 DIAGNOSIS — D509 Iron deficiency anemia, unspecified: Secondary | ICD-10-CM | POA: Diagnosis not present

## 2022-10-14 DIAGNOSIS — Z86010 Personal history of colonic polyps: Secondary | ICD-10-CM

## 2022-10-14 LAB — CBC
HCT: 31.4 % — ABNORMAL LOW (ref 39.0–52.0)
Hemoglobin: 9.4 g/dL — ABNORMAL LOW (ref 13.0–17.0)
MCH: 32.1 pg (ref 26.0–34.0)
MCHC: 29.9 g/dL — ABNORMAL LOW (ref 30.0–36.0)
MCV: 107.2 fL — ABNORMAL HIGH (ref 80.0–100.0)
Platelets: 168 10*3/uL (ref 150–400)
RBC: 2.93 MIL/uL — ABNORMAL LOW (ref 4.22–5.81)
RDW: 24.3 % — ABNORMAL HIGH (ref 11.5–15.5)
WBC: 8.8 10*3/uL (ref 4.0–10.5)
nRBC: 0 % (ref 0.0–0.2)

## 2022-10-14 LAB — CBC WITH DIFFERENTIAL/PLATELET
Abs Immature Granulocytes: 0.06 10*3/uL (ref 0.00–0.07)
Basophils Absolute: 0 10*3/uL (ref 0.0–0.1)
Basophils Relative: 0 %
Eosinophils Absolute: 0.1 10*3/uL (ref 0.0–0.5)
Eosinophils Relative: 1 %
HCT: 34.7 % — ABNORMAL LOW (ref 39.0–52.0)
Hemoglobin: 10.5 g/dL — ABNORMAL LOW (ref 13.0–17.0)
Immature Granulocytes: 1 %
Lymphocytes Relative: 7 %
Lymphs Abs: 0.6 10*3/uL — ABNORMAL LOW (ref 0.7–4.0)
MCH: 32.2 pg (ref 26.0–34.0)
MCHC: 30.3 g/dL (ref 30.0–36.0)
MCV: 106.4 fL — ABNORMAL HIGH (ref 80.0–100.0)
Monocytes Absolute: 0.7 10*3/uL (ref 0.1–1.0)
Monocytes Relative: 7 %
Neutro Abs: 7.6 10*3/uL (ref 1.7–7.7)
Neutrophils Relative %: 84 %
Platelets: 159 10*3/uL (ref 150–400)
RBC: 3.26 MIL/uL — ABNORMAL LOW (ref 4.22–5.81)
RDW: 24 % — ABNORMAL HIGH (ref 11.5–15.5)
WBC: 9 10*3/uL (ref 4.0–10.5)
nRBC: 0.2 % (ref 0.0–0.2)

## 2022-10-14 LAB — COMPREHENSIVE METABOLIC PANEL
ALT: 35 U/L (ref 0–44)
AST: 72 U/L — ABNORMAL HIGH (ref 15–41)
Albumin: 2.2 g/dL — ABNORMAL LOW (ref 3.5–5.0)
Alkaline Phosphatase: 220 U/L — ABNORMAL HIGH (ref 38–126)
Anion gap: 15 (ref 5–15)
BUN: 15 mg/dL (ref 8–23)
CO2: 26 mmol/L (ref 22–32)
Calcium: 8.9 mg/dL (ref 8.9–10.3)
Chloride: 92 mmol/L — ABNORMAL LOW (ref 98–111)
Creatinine, Ser: 3.24 mg/dL — ABNORMAL HIGH (ref 0.61–1.24)
GFR, Estimated: 19 mL/min — ABNORMAL LOW (ref >60)
Glucose, Bld: 108 mg/dL — ABNORMAL HIGH (ref 70–99)
Potassium: 4.4 mmol/L (ref 3.5–5.1)
Sodium: 133 mmol/L — ABNORMAL LOW (ref 135–145)
Total Bilirubin: 1.3 mg/dL — ABNORMAL HIGH (ref 0.3–1.2)
Total Protein: 6.4 g/dL — ABNORMAL LOW (ref 6.5–8.1)

## 2022-10-14 LAB — RENAL FUNCTION PANEL
Albumin: 2 g/dL — ABNORMAL LOW (ref 3.5–5.0)
Anion gap: 15 (ref 5–15)
BUN: 18 mg/dL (ref 8–23)
CO2: 23 mmol/L (ref 22–32)
Calcium: 8.6 mg/dL — ABNORMAL LOW (ref 8.9–10.3)
Chloride: 93 mmol/L — ABNORMAL LOW (ref 98–111)
Creatinine, Ser: 3.8 mg/dL — ABNORMAL HIGH (ref 0.61–1.24)
GFR, Estimated: 16 mL/min — ABNORMAL LOW (ref >60)
Glucose, Bld: 180 mg/dL — ABNORMAL HIGH (ref 70–99)
Phosphorus: 3.9 mg/dL (ref 2.5–4.6)
Potassium: 4.6 mmol/L (ref 3.5–5.1)
Sodium: 131 mmol/L — ABNORMAL LOW (ref 135–145)

## 2022-10-14 LAB — LACTIC ACID, PLASMA
Lactic Acid, Venous: 2.4 mmol/L (ref 0.5–1.9)
Lactic Acid, Venous: 2.6 mmol/L (ref 0.5–1.9)
Lactic Acid, Venous: 2.7 mmol/L (ref 0.5–1.9)

## 2022-10-14 LAB — TYPE AND SCREEN
ABO/RH(D): O POS
Antibody Screen: NEGATIVE

## 2022-10-14 LAB — BRAIN NATRIURETIC PEPTIDE: B Natriuretic Peptide: 813.7 pg/mL — ABNORMAL HIGH (ref 0.0–100.0)

## 2022-10-14 LAB — LIPASE, BLOOD: Lipase: 39 U/L (ref 11–51)

## 2022-10-14 LAB — RESP PANEL BY RT-PCR (FLU A&B, COVID) ARPGX2
Influenza A by PCR: NEGATIVE
Influenza B by PCR: NEGATIVE
SARS Coronavirus 2 by RT PCR: NEGATIVE

## 2022-10-14 LAB — PROTIME-INR
INR: 1.6 — ABNORMAL HIGH (ref 0.8–1.2)
Prothrombin Time: 18.7 seconds — ABNORMAL HIGH (ref 11.4–15.2)

## 2022-10-14 LAB — GLUCOSE, CAPILLARY: Glucose-Capillary: 183 mg/dL — ABNORMAL HIGH (ref 70–99)

## 2022-10-14 LAB — MRSA NEXT GEN BY PCR, NASAL: MRSA by PCR Next Gen: NOT DETECTED

## 2022-10-14 LAB — PROCALCITONIN: Procalcitonin: 0.95 ng/mL

## 2022-10-14 MED ORDER — POLYETHYLENE GLYCOL 3350 17 G PO PACK: 17.0000 g | PACK | Freq: Every day | ORAL | Status: DC | PRN

## 2022-10-14 MED ORDER — LIDOCAINE HCL (PF) 1 % IJ SOLN: 5.0000 mL | INTRAMUSCULAR | Status: DC | PRN

## 2022-10-14 MED ORDER — LINEZOLID 600 MG/300ML IV SOLN
600.0000 mg | Freq: Once | INTRAVENOUS | Status: DC
  Filled 2022-10-14: qty 300

## 2022-10-14 MED ORDER — SODIUM CHLORIDE 0.9 % IV SOLN: 250.0000 mL | INTRAVENOUS | Status: DC

## 2022-10-14 MED ORDER — LACTULOSE 10 GM/15ML PO SOLN
20.0000 g | Freq: Two times a day (BID) | ORAL | Status: DC | PRN
  Administered 2022-10-15 (×2): 20 g via ORAL
  Filled 2022-10-14 (×2): qty 30

## 2022-10-14 MED ORDER — APIXABAN 5 MG PO TABS: 5.0000 mg | ORAL_TABLET | Freq: Two times a day (BID) | ORAL | Status: DC

## 2022-10-14 MED ORDER — ANTICOAGULANT SODIUM CITRATE 4% (200MG/5ML) IV SOLN: 5.0000 mL | Status: DC | PRN

## 2022-10-14 MED ORDER — VITAMIN D 25 MCG (1000 UNIT) PO TABS
2000.0000 [IU] | ORAL_TABLET | Freq: Every day | ORAL | Status: DC
  Administered 2022-10-14 – 2022-10-16 (×3): 2000 [IU] via ORAL
  Filled 2022-10-14 (×3): qty 2

## 2022-10-14 MED ORDER — DOCUSATE SODIUM 100 MG PO CAPS
100.0000 mg | ORAL_CAPSULE | Freq: Two times a day (BID) | ORAL | Status: DC | PRN
  Administered 2022-10-15: 100 mg via ORAL
  Filled 2022-10-14: qty 1

## 2022-10-14 MED ORDER — HEPARIN (PORCINE) 25000 UT/250ML-% IV SOLN
1500.0000 [IU]/h | INTRAVENOUS | Status: DC
  Administered 2022-10-15: 1500 [IU]/h via INTRAVENOUS
  Filled 2022-10-14: qty 250

## 2022-10-14 MED ORDER — LACTATED RINGERS IV SOLN: INTRAVENOUS | Status: DC

## 2022-10-14 MED ORDER — NOREPINEPHRINE 4 MG/250ML-% IV SOLN
2.0000 ug/min | INTRAVENOUS | Status: DC
  Administered 2022-10-14: 2 ug/min via INTRAVENOUS
  Filled 2022-10-14: qty 250

## 2022-10-14 MED ORDER — METRONIDAZOLE 500 MG/100ML IV SOLN
500.0000 mg | Freq: Once | INTRAVENOUS | Status: AC
  Administered 2022-10-14: 500 mg via INTRAVENOUS
  Filled 2022-10-14: qty 100

## 2022-10-14 MED ORDER — ALTEPLASE 2 MG IJ SOLR: 2.0000 mg | Freq: Once | INTRAMUSCULAR | Status: DC | PRN

## 2022-10-14 MED ORDER — LIDOCAINE-PRILOCAINE 2.5-2.5 % EX CREA: 1.0000 | TOPICAL_CREAM | CUTANEOUS | Status: DC | PRN

## 2022-10-14 MED ORDER — MIDODRINE HCL 5 MG PO TABS: 10.0000 mg | ORAL_TABLET | Freq: Three times a day (TID) | ORAL | Status: DC

## 2022-10-14 MED ORDER — HEPARIN SODIUM (PORCINE) 1000 UNIT/ML DIALYSIS: 1000.0000 [IU] | INTRAMUSCULAR | Status: DC | PRN

## 2022-10-14 MED ORDER — TRAMADOL HCL 50 MG PO TABS
50.0000 mg | ORAL_TABLET | Freq: Two times a day (BID) | ORAL | Status: DC | PRN
  Administered 2022-10-14 – 2022-10-15 (×2): 50 mg via ORAL
  Filled 2022-10-14 (×2): qty 1

## 2022-10-14 MED ORDER — PANTOPRAZOLE SODIUM 40 MG PO TBEC
40.0000 mg | DELAYED_RELEASE_TABLET | Freq: Every day | ORAL | Status: DC
  Administered 2022-10-14 – 2022-10-16 (×3): 40 mg via ORAL
  Filled 2022-10-14 (×3): qty 1

## 2022-10-14 MED ORDER — PENTAFLUOROPROP-TETRAFLUOROETH EX AERO: 1.0000 | INHALATION_SPRAY | CUTANEOUS | Status: DC | PRN

## 2022-10-14 MED ORDER — ROSUVASTATIN CALCIUM 20 MG PO TABS
40.0000 mg | ORAL_TABLET | Freq: Every day | ORAL | Status: DC
  Administered 2022-10-14: 40 mg via ORAL
  Filled 2022-10-14: qty 2

## 2022-10-14 MED ORDER — IOHEXOL 350 MG/ML SOLN
75.0000 mL | Freq: Once | INTRAVENOUS | Status: AC | PRN
  Administered 2022-10-14: 75 mL via INTRAVENOUS

## 2022-10-14 MED ORDER — LINEZOLID 600 MG PO TABS
600.0000 mg | ORAL_TABLET | Freq: Once | ORAL | Status: AC
  Administered 2022-10-14: 600 mg via ORAL
  Filled 2022-10-14: qty 1

## 2022-10-14 MED ORDER — SODIUM CHLORIDE 0.9 % IV BOLUS (SEPSIS)
1000.0000 mL | Freq: Once | INTRAVENOUS | Status: AC
  Administered 2022-10-14: 1000 mL via INTRAVENOUS

## 2022-10-14 MED ORDER — CHLORHEXIDINE GLUCONATE CLOTH 2 % EX PADS
6.0000 | MEDICATED_PAD | Freq: Every day | CUTANEOUS | Status: DC
  Administered 2022-10-14 – 2022-10-16 (×3): 6 via TOPICAL

## 2022-10-14 MED ORDER — ONDANSETRON HCL 4 MG/2ML IJ SOLN: 4.0000 mg | Freq: Once | INTRAMUSCULAR | Status: DC

## 2022-10-14 MED ORDER — SODIUM CHLORIDE 0.9 % IV SOLN
1.0000 g | INTRAVENOUS | Status: DC
  Administered 2022-10-14 – 2022-10-15 (×2): 1 g via INTRAVENOUS
  Filled 2022-10-14 (×2): qty 10

## 2022-10-14 MED ORDER — SODIUM CHLORIDE 0.9 % IV SOLN: 2.0000 g | Freq: Once | INTRAVENOUS | Status: DC

## 2022-10-14 NOTE — Progress Notes (Signed)
Pharmacy Antibiotic Note  Louis [name] is a 76 y.o. male admitted on 10/13/2022 with sepsis.  Pharmacy has been consulted for cefepime dosing. Patient is ESRD on iHD. 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  Plan: Cefepime 1g IV q24h - give after HD on HD days Flagyl per MD Primary MD to speak with ID service re: gram+ coverage as dapto is restricted     Temp (24hrs), Avg:97.4 F (36.3 C), Min:97.2 F (36.2 C), Max:97.6 F (36.4 C)  Recent Labs  Lab 09/27/2022 1345  WBC 9.0  CREATININE 3.24*    CrCl cannot be calculated (Unknown ideal weight.).    Allergies  Allergen Reactions   Hydrocodone Nausea And Vomiting    Other reaction(s): GI Upset (intolerance) Projectile vomiting    Oxycodone Nausea And Vomiting    Other reaction(s): GI Upset (intolerance), Vomiting (intolerance) Projectile vomiting    Vancomycin Anaphylaxis    ORAL VANCOMYCIN for C diff.   Dacarbazine Other (See Comments)    Unknown reaction   Latex Itching and Rash   Tape Dermatitis, Itching and Rash    Patch used at dialysis    Antimicrobials this admission: Cefepime 11/25> Flagyl 11/25>  Dose adjustments this admission:   Microbiology results: 11/25 Bcx: 11/25 Resp Panel:  Thank you for allowing pharmacy to be a part of this patient's care. Wilson Singer, PharmD Clinical Pharmacist 09/27/2022 3:27 PM

## 2022-10-14 NOTE — H&P (Signed)
NAME:  Louis [name], MRN:  [id number], DOB:  15-Mar-1946, LOS: 0 ADMISSION DATE:  09/25/2022, CONSULTATION DATE:  11/25 REFERRING MD:  Kathrynn Humble, CHIEF COMPLAINT:  hypotension   History of Present Illness:  76 year old male patient with history of end-stage renal disease receives dialysis Tuesday Thursday Saturday, recently hospitalized for osteomyelitis involving the left hand, this was following recent left upper arm AV fistula ligation.  Ultimately required surgical intervention including extensive debridement, and amputation of the left thumb.  He was ultimately discharged on 10/25, with plans to complete a total of 6 weeks of intravenous Fortaz and daptomycin which she has since done.  He has been followed in the orthopedic clinic, his sutures have since been removed, follow-up exams surgical wise have been encouraging without evidence of infection. He presented to the emergency room on 11/25 with family reporting approximately 2 to 2-1/2-day history of progressive weakness, increased fatigue, and poor p.o. intake.  He has been weak since his hospital/rehab discharge, however at baseline had gotten to a point where he could stand and pivot to a chair with a two-person assist.  His weakness had gotten so bad on day of admission he could not not stand with full assist of 2 people.  He was able to eventually get to dialysis on 11/25 at which time he was noted to be fairly hypotensive during the case with systolic blood pressure in the 80s.  He has been on midodrine since the early part of October, instructed to take prior to dialysis.  Because of his weakness, and hypotension, he presented to the emergency room.  Pertinent  Medical History  End-stage renal disease TTS, peripheral vascular disease, recent osteomyelitis requiring amputation of left thumb, hyperlipidemia, obesity with BMI 30-39.9, hypotension, requiring midodrine prior to dialysis, type 2 diabetes atrial fibrillation on DOAC, GERD,  esophageal stricture with esophageal dysphagia Chronic gastric ulcer  Significant Hospital Events: Including procedures, antibiotic start and stop dates in addition to other pertinent events   11/25 admitted with hypotension and weakness  Interim History / Subjective:    Objective   Blood pressure (Abnormal) 83/57, pulse 66, temperature (Abnormal) 97.2 F (36.2 C), resp. rate 17, SpO2 96 %.       No intake or output data in the 24 hours ending 09/26/2022 1618 There were no vitals filed for this visit.  Examination: General: 76 year old male patient chronically ill-appearing but in no acute distress HENT: Normocephalic atraumatic no jugular venous distention mucous membranes are moist Lungs: Diminished bases no accessory use Cardiovascular: Regular rate and rhythm Abdomen: Soft not tender Extremities: Left thumb surgical site clean dry and intact, chronic lower extremity swelling he has braces on both extremities Neuro: Awake oriented no focal deficits GU: Due to void  Resolved Hospital Problem list     Assessment & Plan:  Hypotension.  Etiology not clear. Lactate mildly elevated, normal white blood cell count.  Given history of infection certainly concerned about recurrent sepsis.  He does have a tunneled catheter, I also worry about aspiration given history of esophageal stricture, however this could just be ESRD related vasoplegia, also need to rule out cardiac etiology plan Fluid challenge echo Agree with blood cultures Think we should go ahead and empirically cover for for infection, will start him on cefepime and zyvox Continue with midodrine, he may need higher dosing Low-dose norepinephrine for mean arterial pressure greater than 65 Nephrology consult on 11/26  History of esophageal stricture with severe esophageal dysmotility. He had his most recent endoscopy  by Dr. Fuller Plan on 10/17 where he underwent esophageal dilation.  Family is reporting coughing again "choking on  p.o.'s" Plan PPI Will needed repeat esophagram  Pulm infiltrates bilateral w/ bilateral effusion: ddx PNA vs vol overload w/ atx Plan IV abx Pulm hygiene   End-stage renal disease TTS Last dialysis 11/25 Plan Nephrology consultation Serial chemistries, no current indication for emergent dialysis Strict intake output.  Generalized weakness Plan Checking TSH Checking B vitamins as and folate level  History of atrial fibrillation Plan Continuing DOAC Rate control  History of diabetes type 2 Plan Sliding scale insulin  Best Practice (right click and "Reselect all SmartList Selections" daily)   Diet/type: Regular consistency (see orders) DVT prophylaxis: DOAC GI prophylaxis: N/A Lines: Dialysis Catheter Foley:  N/A Code Status:  full code Last date of multidisciplinary goals of care discussion [pending]  Labs   CBC: Recent Labs  Lab 10/11/2022 1345  WBC 9.0  NEUTROABS 7.6  HGB 10.5*  HCT 34.7*  MCV 106.4*  PLT 662    Basic Metabolic Panel: Recent Labs  Lab 10/11/2022 1345  NA 133*  K 4.4  CL 92*  CO2 26  GLUCOSE 108*  BUN 15  CREATININE 3.24*  CALCIUM 8.9   GFR: CrCl cannot be calculated (Unknown ideal weight.). Recent Labs  Lab 09/20/2022 1345  WBC 9.0    Liver Function Tests: Recent Labs  Lab 10/02/2022 1345  AST 72*  ALT 35  ALKPHOS 220*  BILITOT 1.3*  PROT 6.4*  ALBUMIN 2.2*   No results for input(s): "LIPASE", "AMYLASE" in the last 168 hours. No results for input(s): "AMMONIA" in the last 168 hours.  ABG    Component Value Date/Time   PHART 7.463 (H) 12/18/2018 1233   PCO2ART 44.4 12/18/2018 1233   PO2ART 82.0 (L) 12/18/2018 1233   HCO3 32.8 (H) 12/18/2018 1233   HCO3 31.7 (H) 12/18/2018 1233   TCO2 29 08/29/2022 1420   O2SAT 76.0 12/18/2018 1233   O2SAT 97.0 12/18/2018 1233     Coagulation Profile: Recent Labs  Lab 10/09/2022 1345  INR 1.6*    Cardiac Enzymes: No results for input(s): "CKTOTAL", "CKMB", "CKMBINDEX",  "TROPONINI" in the last 168 hours.  HbA1C: Hemoglobin A1C  Date/Time Value Ref Range Status  03/12/2022 12:00 AM 6.2  Final  03/18/2021 01:31 PM 6.6 (A) 4.0 - 5.6 % Final  12/17/2020 01:38 PM 5.8 (A) 4.0 - 5.6 % Final  09/09/2020 12:00 AM 6.5  Final    CBG: No results for input(s): "GLUCAP" in the last 168 hours.  Review of Systems:   Review of Systems  Constitutional:  Positive for malaise/fatigue and weight loss.  HENT: Negative.    Eyes: Negative.   Respiratory:  Positive for cough.   Cardiovascular: Negative.   Gastrointestinal:  Positive for heartburn.  Genitourinary: Negative.   Musculoskeletal: Negative.   Skin: Negative.   Neurological:  Positive for weakness.  Endo/Heme/Allergies: Negative.      Past Medical History:  He,  has a past medical history of Arthritis, Brittle bones, CAD (coronary artery disease), Cancer ([place]), Cataract, Charcot ankle, right (2019), CHF (congestive heart failure) ([place]) (2015), Clostridium difficile colitis (10/09/2022), Diabetes mellitus, ESRD (end stage renal disease) on dialysis ([place]), Heart failure, diastolic ([place]), History of kidney stones, Hyperlipidemia, Hypertension, Macular degeneration disease, Macular edema (2014), OSA on CPAP, Paroxysmal atrial fibrillation ([place]), Personal history of colonic polyps - adenomas (01/28/2014), Shortness of breath dyspnea, and Syncope and collapse.   Surgical History:   Past Surgical History:  Procedure Laterality Date   AV FISTULA PLACEMENT Left 09/01/2020   Procedure: LEFT ARM ARTERIOVENOUS (AV) FISTULA;  Surgeon: Waynetta Sandy, MD;  Location: [facility];  Service: Vascular;  Laterality: Left;   [place] Left 10/27/2020   Procedure: LEFT SECOND STAGE BASILIC VEIN FISTULA TRANSPOSITION;  Surgeon: Waynetta Sandy, MD;  Location: [facility];  Service: Vascular;  Laterality: Left;   BIOPSY  09/05/2022   Procedure: BIOPSY;  Surgeon: Ladene Artist, MD;  Location: [facility]  ENDOSCOPY;  Service: Gastroenterology;;   CARPAL TUNNEL RELEASE     left hand   COLONOSCOPY     ESOPHAGOGASTRODUODENOSCOPY (EGD) WITH PROPOFOL N/A 09/05/2022   Procedure: ESOPHAGOGASTRODUODENOSCOPY (EGD) WITH PROPOFOL;  Surgeon: Ladene Artist, MD;  Location: [facility];  Service: Gastroenterology;  Laterality: N/A;   I & D EXTREMITY Left 08/30/2022   Procedure: IRRIGATION AND DEBRIDEMENT LEFT THUMB;  Surgeon: Iran Planas, MD;  Location: [facility];  Service: Orthopedics;  Laterality: Left;   I & D EXTREMITY Left 09/04/2022   Procedure: IRRIGATION AND DEBRIDEMENT LEFT THUMB POSSIBLE AMPUTATION;  Surgeon: Iran Planas, MD;  Location: [facility];  Service: Orthopedics;  Laterality: Left;   INSERTION OF DIALYSIS CATHETER Left 08/29/2022   Procedure: INSERTION OF DIALYSIS CATHETER;  Surgeon: Angelia Mould, MD;  Location: [facility];  Service: Vascular;  Laterality: Left;   INTRAVASCULAR PRESSURE WIRE/FFR STUDY N/A 12/18/2018   Procedure: INTRAVASCULAR PRESSURE WIRE/FFR STUDY;  Surgeon: Wellington Hampshire, MD;  Location: [facility] CV LAB;  Service: Cardiovascular;  Laterality: N/A;   IR FLUORO GUIDE CV LINE RIGHT  12/16/2018   IR US GUIDE VASC ACCESS RIGHT  12/16/2018   KNEE ARTHROSCOPY Right 09/13/2016   [facility], Dr. Dorna Leitz   LIGATION OF ARTERIOVENOUS  FISTULA Left 08/29/2022   Procedure: LIGATION OF ARTERIOVENOUS  FISTULA LEFT ARM;  Surgeon: Angelia Mould, MD;  Location: [facility];  Service: Vascular;  Laterality: Left;   PILONIDAL CYST EXCISION     RIGHT/LEFT HEART CATH AND CORONARY ANGIOGRAPHY N/A 12/18/2018   Procedure: RIGHT/LEFT HEART CATH AND CORONARY ANGIOGRAPHY;  Surgeon: Wellington Hampshire, MD;  Location: [facility] CV LAB;  Service: Cardiovascular;  Laterality: N/A;   SAVORY DILATION N/A 09/05/2022   Procedure: SAVORY DILATION;  Surgeon: Ladene Artist, MD;  Location: [facility];  Service: Gastroenterology;  Laterality: N/A;     Social History:   reports  that he quit smoking about 45 years ago. His smoking use included cigarettes. He has a 10.00 pack-year smoking history. He has never used smokeless tobacco. He reports that he does not drink alcohol and does not use drugs.   Family History:  His family history includes Cancer in his paternal grandmother; Coronary artery disease in his father; Diabetes in his brother, father, maternal grandmother, mother, and sister; Heart attack in his paternal grandfather; Kidney disease in his mother; Prostate cancer in his maternal uncle. There is no history of Colon cancer.   Allergies Allergies  Allergen Reactions   Hydrocodone Nausea And Vomiting    Other reaction(s): GI Upset (intolerance) Projectile vomiting    Oxycodone Nausea And Vomiting    Other reaction(s): GI Upset (intolerance), Vomiting (intolerance) Projectile vomiting    Vancomycin Anaphylaxis    ORAL VANCOMYCIN for C diff.   Dacarbazine Other (See Comments)    Unknown reaction   Latex Itching and Rash   Tape Dermatitis, Itching and Rash    Patch used at dialysis     Home Medications  Prior to Admission medications   Medication Sig Start Date End Date Taking? Authorizing Provider  B-D INS SYR ULTRAFINE 1CC/31G 31G X 5/16" 1 ML MISC  02/06/17   [provider]  blood glucose meter kit and supplies Use to check blood sugars daily E11.65 05/22/16   [provider]  cefTAZidime 2 g in sodium chloride 0.9 % 100 mL Inject 2 g into the vein every Monday, Wednesday, and Friday with hemodialysis. Patient not taking: Reported on 10/09/2022 09/13/22 October 18, 2022  Charlynne Cousins, MD  Cholecalciferol (VITAMIN D3) 50 MCG (2000 UT) TABS Take 2,000 Units by mouth daily.     [provider]  Coenzyme Q10 (COQ10) 200 MG CAPS Take 200 mg by mouth daily.    [provider]  Continuous Blood Gluc Receiver (FREESTYLE LIBRE 14 DAY READER) DEVI Dx DM E11.22 Check blood sugar 4 times daily. 06/14/20   [name], MD  Continuous Blood Gluc Sensor (FREESTYLE LIBRE 14 DAY SENSOR) MISC Dx DM E11.22 Check blood sugar 4 times daily. 05/15/22   [name], MD  DAPTOmycin 650 mg in sodium chloride 0.9 % 50 mL Inject 650 mg into the vein as directed. After dialysis on Mondays and Wednesdays. Patient not taking: Reported on 10/09/2022 09/13/22 Oct 18, 2022  Charlynne Cousins, MD  DAPTOmycin 950 mg in sodium chloride 0.9 % 50 mL Inject 950 mg into the vein every Friday at 6 PM. After dialysis on Fridays. Patient not taking: Reported on 10/09/2022 09/15/22 2022-10-18  Charlynne Cousins, MD  Doxercalciferol (HECTOROL IV) Doxercalciferol (Hectorol) 08/15/22 08/14/23  [provider]  ELIQUIS 5 MG TABS tablet Take 1 tablet (5 mg total) by mouth 2 (two) times daily. 07/07/22   Lelon Perla, MD  furosemide (LASIX) 40 MG tablet TAKE 1 TABLET BY MOUTH ONCE DAILY AS NEEDED 10/02/22   [name], MD  lidocaine-prilocaine (EMLA) cream SMARTSIG:sparingly Topical As Directed 04/10/22   [provider]  Methoxy PEG-Epoetin Beta (MIRCERA IJ) Mircera 08/01/22 07/31/23  [provider]  midodrine (PROAMATINE) 10 MG tablet Take 10 mg by mouth as directed. Take 30 min. Before dialysis treatment 08/24/22   [provider]  nitroGLYCERIN (NITROSTAT) 0.4 MG SL tablet Place 1 tablet (0.4 mg total) under the tongue every 5 (five) minutes as needed for chest pain. 12/29/20   Lelon Perla, MD  NOVOLIN N 100 UNIT/ML injection Inject 10 Units into the skin 2 (two) times daily before a meal. 12/05/16   [provider]  NOVOLIN R 100 UNIT/ML injection Inject 1-15 Units into the skin 3 (three) times daily with meals. Sliding Scale 02/10/16   [provider]  omega-3 acid ethyl esters (LOVAZA) 1 g capsule Take 1 g by mouth daily.    [provider]  [facility] ULTRA test strip USE 1 STRIP TO CHECK GLUCOSE THREE TIMES DAILY 05/15/22   [name], MD   Probiotic Product (PROBIOTIC DAILY PO) Take 420 mg by mouth daily.    [provider]  rosuvastatin (CRESTOR) 40 MG tablet Take 1 tablet by mouth once daily Patient taking differently: Take 40 mg by mouth daily. 05/02/22   Lelon Perla, MD  silver sulfADIAZINE (SILVADENE) 1 % cream Apply 1 Application topically daily as needed (wound care, itching). 02/28/22   [provider]  Vitamin Mixture (VITAMIN E COMPLETE PO) Take 1 capsule by mouth daily. Vitamin E complex    [provider]  ZINC SULFATE PO Take 1 tablet  by mouth daily.     [provider]     Critical care time: 3 min      \

## 2022-10-14 NOTE — ED Notes (Signed)
Pt's BP dropped down to 88/57. EDP Nanavati made aware.

## 2022-10-14 NOTE — ED Provider Notes (Signed)
[place] [facility] EMERGENCY DEPARTMENT Provider Note   CSN: [id number] Arrival date & time: 09/21/2022  1226     History {Add pertinent medical, surgical, social history, OB history to HPI:1} No chief complaint on file.  HPI Louis [name] is a 76 y.o. male with ESRD, CHF, A-fib and CAD and recent history of osteomyelitis of the left thumb presenting for hypotension and weakness.  Dialysis occurred at 7-11 30 this morning.  Wife states that he has been very weak the last couple days and she had to assist him to his out of the car and states he has no strength in his legs.  After dialysis, clinic staff was concerned for hypotension which prompted call to EMS.  Lowest blood pressure was 77/44 and highest was 118/90 per EMS.  Wife also mentioned to 3-day history of black tarry stools earlier this week.  Eliquis was discontinued.  Stools normalized and were more brown in color.  Eliquis restarted yesterday.  Wife states that hemoglobin checked on 2 weeks ago was 9.6 and 2 days ago was 9.4.  Patient is nauseous but denies abdominal and chest pain.  Denies shortness of breath.  Wife states he looks more pale.  HPI     Home Medications Prior to Admission medications   Medication Sig Start Date End Date Taking? Authorizing Provider  B-D INS SYR ULTRAFINE 1CC/31G 31G X 5/16" 1 ML MISC  02/06/17   [provider]  blood glucose meter kit and supplies Use to check blood sugars daily E11.65 05/22/16   [provider]  cefTAZidime 2 g in sodium chloride 0.9 % 100 mL Inject 2 g into the vein every Monday, Wednesday, and Friday with hemodialysis. Patient not taking: Reported on 10/09/2022 09/13/22 October 18, 2022  Charlynne Cousins, MD  Cholecalciferol (VITAMIN D3) 50 MCG (2000 UT) TABS Take 2,000 Units by mouth daily.     [provider]  Coenzyme Q10 (COQ10) 200 MG CAPS Take 200 mg by mouth daily.    [provider]  Continuous Blood Gluc Receiver (FREESTYLE  LIBRE 14 DAY READER) DEVI Dx DM E11.22 Check blood sugar 4 times daily. 06/14/20   [name], MD  Continuous Blood Gluc Sensor (FREESTYLE LIBRE 14 DAY SENSOR) MISC Dx DM E11.22 Check blood sugar 4 times daily. 05/15/22   [name], MD  DAPTOmycin 650 mg in sodium chloride 0.9 % 50 mL Inject 650 mg into the vein as directed. After dialysis on Mondays and Wednesdays. Patient not taking: Reported on 10/09/2022 09/13/22 10-18-2022  Charlynne Cousins, MD  DAPTOmycin 950 mg in sodium chloride 0.9 % 50 mL Inject 950 mg into the vein every Friday at 6 PM. After dialysis on Fridays. Patient not taking: Reported on 10/09/2022 09/15/22 10/18/2022  Charlynne Cousins, MD  Doxercalciferol (HECTOROL IV) Doxercalciferol (Hectorol) 08/15/22 08/14/23  [provider]  ELIQUIS 5 MG TABS tablet Take 1 tablet (5 mg total) by mouth 2 (two) times daily. 07/07/22   Lelon Perla, MD  furosemide (LASIX) 40 MG tablet TAKE 1 TABLET BY MOUTH ONCE DAILY AS NEEDED 10/02/22   [name], MD  lidocaine-prilocaine (EMLA) cream SMARTSIG:sparingly Topical As Directed 04/10/22   [provider]  Methoxy PEG-Epoetin Beta (MIRCERA IJ) Mircera 08/01/22 07/31/23  [provider]  midodrine (PROAMATINE) 10 MG tablet Take 10 mg by mouth as directed. Take 30 min. Before dialysis treatment 08/24/22   [provider]  nitroGLYCERIN (NITROSTAT) 0.4 MG SL tablet Place 1  tablet (0.4 mg total) under the tongue every 5 (five) minutes as needed for chest pain. 12/29/20   Lelon Perla, MD  NOVOLIN N 100 UNIT/ML injection Inject 10 Units into the skin 2 (two) times daily before a meal. 12/05/16   [provider]  NOVOLIN R 100 UNIT/ML injection Inject 1-15 Units into the skin 3 (three) times daily with meals. Sliding Scale 02/10/16   [provider]  omega-3 acid ethyl esters (LOVAZA) 1 g capsule Take 1 g by mouth daily.    [provider]  [facility] ULTRA  test strip USE 1 STRIP TO CHECK GLUCOSE THREE TIMES DAILY 05/15/22   [name], MD  Probiotic Product (PROBIOTIC DAILY PO) Take 420 mg by mouth daily.    [provider]  rosuvastatin (CRESTOR) 40 MG tablet Take 1 tablet by mouth once daily Patient taking differently: Take 40 mg by mouth daily. 05/02/22   Lelon Perla, MD  silver sulfADIAZINE (SILVADENE) 1 % cream Apply 1 Application topically daily as needed (wound care, itching). 02/28/22   [provider]  Vitamin Mixture (VITAMIN E COMPLETE PO) Take 1 capsule by mouth daily. Vitamin E complex    [provider]  ZINC SULFATE PO Take 1 tablet by mouth daily.     [provider]      Allergies    Hydrocodone, Oxycodone, Vancomycin, Dacarbazine, Latex, and Tape    Review of Systems   Review of Systems  Gastrointestinal:  Positive for nausea.  Neurological:  Positive for weakness.    Physical Exam Updated Vital Signs BP (!) 83/57   Pulse 66   Temp (!) 97.2 F (36.2 C)   Resp 17   SpO2 96%  Physical Exam Vitals and nursing note reviewed.  HENT:     Head: Normocephalic and atraumatic.     Mouth/Throat:     Mouth: Mucous membranes are moist.  Eyes:     General:        Right eye: No discharge.        Left eye: No discharge.     Conjunctiva/sclera: Conjunctivae normal.  Cardiovascular:     Rate and Rhythm: Normal rate. Rhythm irregular.     Pulses: Normal pulses.          Radial pulses are 2+ on the right side and 2+ on the left side.     Heart sounds: Normal heart sounds.  Pulmonary:     Effort: Pulmonary effort is normal.     Breath sounds: Normal breath sounds.  Abdominal:     General: Abdomen is flat.     Palpations: Abdomen is soft.     Tenderness: There is no abdominal tenderness.  Musculoskeletal:     Comments: Bandage covering the left hand.  Lower left arm without swelling or erythema or warm.   Skin:    General: Skin is warm and dry.     Coloration: Skin is  pale.  Neurological:     General: No focal deficit present.  Psychiatric:        Mood and Affect: Mood normal.     ED Results / Procedures / Treatments   Labs (all labs ordered are listed, but only abnormal results are displayed) Labs Reviewed  COMPREHENSIVE METABOLIC PANEL - Abnormal; Notable for the following components:      Result Value   Sodium 133 (*)    Chloride 92 (*)    Glucose, Bld 108 (*)    Creatinine, Ser 3.24 (*)  Total Protein 6.4 (*)    Albumin 2.2 (*)    AST 72 (*)    Alkaline Phosphatase 220 (*)    Total Bilirubin 1.3 (*)    GFR, Estimated 19 (*)    All other components within normal limits  PROTIME-INR - Abnormal; Notable for the following components:   Prothrombin Time 18.7 (*)    INR 1.6 (*)    All other components within normal limits  CBC WITH DIFFERENTIAL/PLATELET - Abnormal; Notable for the following components:   RBC 3.26 (*)    Hemoglobin 10.5 (*)    HCT 34.7 (*)    MCV 106.4 (*)    RDW 24.0 (*)    Lymphs Abs 0.6 (*)    All other components within normal limits  RESP PANEL BY RT-PCR (FLU A&B, COVID) ARPGX2  CULTURE, BLOOD (ROUTINE X 2)  CULTURE, BLOOD (ROUTINE X 2)  LACTIC ACID, PLASMA  LACTIC ACID, PLASMA  TYPE AND SCREEN    EKG None  Radiology No results found.  Procedures Procedures  {Document cardiac monitor, telemetry assessment procedure when appropriate:1}  Medications Ordered in ED Medications  ondansetron (ZOFRAN) injection 4 mg (has no administration in time range)  lactated ringers infusion (has no administration in time range)  sodium chloride 0.9 % bolus 1,000 mL (1,000 mLs Intravenous New Bag/Given 09/29/2022 1558)  metroNIDAZOLE (FLAGYL) IVPB 500 mg (has no administration in time range)  ceFEPIme (MAXIPIME) 1 g in sodium chloride 0.9 % 100 mL IVPB (1 g Intravenous New Bag/Given 10/04/2022 1558)    ED Course/ Medical Decision Making/ A&P                           Medical Decision Making Amount and/or Complexity  of Data Reviewed Labs: ordered. Radiology: ordered. ECG/medicine tests: ordered.  Risk Prescription drug management.   Initial Impression and Ddx 76 year old male who nontoxic, well-appearing and hemodynamically stable presenting for generalized weakness and hypotension.  Physical exam notable for irregular heart rate, pale, and normotensive.  Frontal diagnosis for this complaint includes symptomatic anemia secondary to GI bleed, bacteremia or infection, hypovolemia secondary to recent dialysis. Patient PMH that increases complexity of ED encounter: ESRD, CHF, A-fib on blood thinner, and CAD and recent history of osteomyelitis  Interpretation of Diagnostics I independent reviewed and interpreted the labs as followed: anemia, elevated creatinine, elevated alkaline phosphatase, hyponatremia, BUN WNL  - No images ordered at this time  Patient Reassessment and Ultimate Disposition/Management Treated nausea with Zofran.  Patient stated that nausea did improve. Initially concern for symptomatic anemia but reassured that CBC revealed hemoglobin of 10.5 which is an improvement from prior.  Given that he had recent history of osteomyelitis and recent new fistula placement with new symptoms of nausea and generalized weakness prompted concern for infection, primarily bacteremia.  Ordered lactic acid and blood cultures.  Signed out patient to Dr. Kathrynn Humble.   Patient management required discussion with the following services or consulting groups:  None at this time  Complexity of Problems Addressed Chronic illness with exacerbation  Additional Data Reviewed and Analyzed Further history obtained from: {BERODATA:26834}  Patient Encounter Risk Assessment {BERORISK:26838}   {Document critical care time when appropriate:1} {Document review of labs and clinical decision tools ie heart score, Chads2Vasc2 etc:1}  {Document your independent review of radiology images, and any outside  records:1} {Document your discussion with family members, caretakers, and with consultants:1} {Document social determinants of health affecting pt's care:1} {Document your decision making  why or why not admission, treatments were needed:1} Final Clinical Impression(s) / ED Diagnoses Final diagnoses:  None    Rx / DC Orders ED Discharge Orders     None

## 2022-10-14 NOTE — Progress Notes (Signed)
eLink Physician-Brief Progress Note Patient Name: Louis [name] DOB: 11-19-1946 MRN: [id number]   Date of Service  09/22/2022  HPI/Events of Note  75/M with ESRD, admitted for septic shock, from aspiration pneumonia vs. osteomyelitis  eICU Interventions  -Admitted to ICU -Started on appropriate empiric antibiotics (cefepime, linezolid) - Will follow cultures and deescalate as warranted.  - Will hold off aggressive fluid boluses in light of underlying ESRD. Monitor I/Os closely - Titrate levophed to maintain MAP >65 - Trend WBC, lactate, temperature curve.          [place] 09/27/2022, 9:14 PM

## 2022-10-14 NOTE — Progress Notes (Signed)
ANTICOAGULATION CONSULT NOTE - Initial Consult  Pharmacy Consult for heparin gtt Indication: pulmonary embolus  Allergies  Allergen Reactions   Hydrocodone Nausea And Vomiting    Other reaction(s): GI Upset (intolerance) Projectile vomiting    Oxycodone Nausea And Vomiting    Other reaction(s): GI Upset (intolerance), Vomiting (intolerance) Projectile vomiting    Vancomycin Anaphylaxis    ORAL VANCOMYCIN for C diff.   Dacarbazine Other (See Comments)    Unknown reaction   Latex Itching and Rash   Tape Dermatitis, Itching and Rash    Patch used at dialysis    Patient Measurements:   Heparin Dosing Weight: 88.5 kg Historical weight 101 kg   Vital Signs: Temp: 97.2 F (36.2 C) (11/25 1447) Temp Source: Oral (11/25 1304) BP: 82/61 (11/25 1700) Pulse Rate: 87 (11/25 1700)  Labs: Recent Labs    10/09/2022 1345  HGB 10.5*  HCT 34.7*  PLT 159  LABPROT 18.7*  INR 1.6*  CREATININE 3.24*    CrCl cannot be calculated (Unknown ideal weight.).   Medical History: Past Medical History:  Diagnosis Date   Arthritis    Brittle bones    per pt, has soft bones in right foot/wears boot cast!   CAD (coronary artery disease)    Cancer ([place])    skin cancer on arm   Cataract    Bil/ surg scheduled for right eye 01/18/17/ left eye 02/08/17   Charcot ankle, right 2019   CHF (congestive heart failure) ([place]) 2015   Clostridium difficile colitis 10/09/2022   Diabetes mellitus    Type 2   ESRD (end stage renal disease) on dialysis ([place])    Tu/Th/Sa Dialysis   Heart failure, diastolic (HCC)    History of kidney stones    Hyperlipidemia    Hypertension    Macular degeneration disease    Macular edema 2014   OSA on CPAP    Paroxysmal atrial fibrillation (HCC)    Personal history of colonic polyps - adenomas 01/28/2014   Shortness of breath dyspnea    Syncope and collapse    Assessment: 76 yo M with PMH afib on Eliquis 5 BID at home. According to patient family member at  bedside patient received his dose of Eliquis '5mg'$  this morning (11/25). Recent history of dark stools, but per family member this is resolved and pt has had 2 brown bowel movements. Will target low goal per MD. Pharmacy consulted to dose/manage heparin gtt in setting of likely VTE.   CBC: Hgb 10.5/Plt 159  Goal of Therapy:  Heparin level 0.3-0.5 units/ml - per MD aPTT 66-85 s - per MD Monitor platelets by anticoagulation protocol: Yes   Plan:  Target low goal per MD No bolus given recent Eliquis use (11/25 AM) Begin heparin infusion at 1500 units/hr 8hr HL/aPTT at 0500 Daily HL/aPTT, CBC -will trend both aPTT/HL until correlating F/u transition back to enteral therapy   Louis [name], PharmD Clinical Pharmacist 10/17/2022 5:43 PM

## 2022-10-14 NOTE — ED Triage Notes (Signed)
Pt BIB [facility] from dialysis, received full treatment. Had an infection on HD site 4 weeks ago with surgery and amputation of thumb. Pt endorses black tarry stools and generalized abd pain since. Afib rate of 70s-80s. Last BP 77/44, highest was 118/90. Hx diabetes, CBG 148, SPO2 97%. Pt also has a sore on tailbone. Denies CP, SOB. Endorses generalized weakness.

## 2022-10-14 NOTE — Progress Notes (Incomplete)
Attending note: I have seen and examined the patient. History, labs and imaging reviewed.  76 year old with end-stage renal disease, diabetes mellitus type 2 essential hypertension  recent admission for left thumb and ring finger osteo with amputation treatment with abx for 4 weeks, esophageal strictures s/p EGD admitted with admit dark stools, shock, hypotension He has been off eliquis for couple of days  Blood pressure (!) 83/57, pulse 66, temperature (!) 97.2 F (36.2 C), resp. rate 17, SpO2 96 %. Gen:      No acute distress, obese HEENT:  EOMI, sclera anicteric Neck:     No masses; no thyromegaly Lungs:    Clear to auscultation bilaterally; normal respiratory effort CV:         Regular rate and rhythm; no murmurs Abd:      + bowel sounds; soft, non-tender; no palpable masses, no distension Ext:    No edema; adequate peripheral perfusion Skin:      Warm and dry; no rash Neuro: alert and oriented x 3 Psych: normal mood and affect   Labs/Imaging personally reviewed, significant for sodium 133, creatinine 3.24 AST 72 Hb 10.5, lactic acid 2.4 CT abd and chest is pending.   Assessment/plan: Shock Likely has sepsis, asp PNA Other etiology includes ongoing osteomyelitis, GI bleed given dark stools IV fluids Blood cultures Start antibiotic coverage with cefepime and Zyvox Continue midodrine Started on pressors Follow CTA and abd scan  History of strictures with recent dilatation PPI Repeat esophagram when stable as he may be aspirating again  End-stage renal disease Nephrology consult in a.m.   The patient is critically ill with multiple organ systems failure and requires high complexity decision making for assessment and support, frequent evaluation and titration of therapies, application of advanced monitoring technologies and extensive interpretation of multiple databases.  Critical care time - 35 mins. This represents my time independent of the NPs time taking care of the  pt.  Marshell Garfinkel MD [facility] 10/02/2022, 4:07 PM

## 2022-10-14 NOTE — Progress Notes (Signed)
ED calling phone report raising concern for probable PE. Official read not yet dictated. I do no see any large vessel PE. No evidence of RV strain by CT so doubt this accounts for his hypotension. At any rate will change him to heparin from his DOAC (not sure we can call this a failure as he was off his DOAC a few days for dark colored stool).   Erick Colace ACNP-BC [place] Pager # [phone] OR # [phone] if no answer

## 2022-10-14 NOTE — Progress Notes (Signed)
eLink Physician-Brief Progress Note Patient Name: Louis [name] DOB: Jun 24, 1946 MRN: [id number]   Date of Service  10/09/2022  HPI/Events of Note  Pt on sepsis protocol. Remains on norepinephrine infusion.   eICU Interventions  Will titrate NE to maintain MAP >65. Marland Kitchen  Pt not given aggressive fluid boluses in light of underlying ESRD and elevated BNP.         [place] 10/03/2022, 8:22 PM

## 2022-10-14 NOTE — Progress Notes (Signed)
Nephrology aware They will see 11/26. Although tunneled IV access is not preferred both our services (CCM and nephro) will accept short term access of HD cath for pressors given patient request.   We will place new CVL if 1) pressors required > 24 hrs 2) blood cultures are positive 3) needs iHD before he is off pressors.   Erick Colace ACNP-BC [place] Pager # [phone] OR # [phone] if no answer

## 2022-10-14 NOTE — Progress Notes (Signed)
Elink is folloeing code sepsis.

## 2022-10-15 ENCOUNTER — Inpatient Hospital Stay (HOSPITAL_COMMUNITY): Payer: Medicare Other

## 2022-10-15 ENCOUNTER — Encounter (HOSPITAL_COMMUNITY): Payer: Self-pay | Admitting: Pulmonary Disease

## 2022-10-15 DIAGNOSIS — I9589 Other hypotension: Secondary | ICD-10-CM

## 2022-10-15 DIAGNOSIS — R6521 Severe sepsis with septic shock: Secondary | ICD-10-CM | POA: Diagnosis not present

## 2022-10-15 DIAGNOSIS — R4182 Altered mental status, unspecified: Secondary | ICD-10-CM | POA: Diagnosis not present

## 2022-10-15 DIAGNOSIS — G9341 Metabolic encephalopathy: Secondary | ICD-10-CM

## 2022-10-15 DIAGNOSIS — I4891 Unspecified atrial fibrillation: Secondary | ICD-10-CM

## 2022-10-15 DIAGNOSIS — J90 Pleural effusion, not elsewhere classified: Secondary | ICD-10-CM | POA: Diagnosis not present

## 2022-10-15 DIAGNOSIS — E872 Acidosis, unspecified: Secondary | ICD-10-CM

## 2022-10-15 DIAGNOSIS — L8990 Pressure ulcer of unspecified site, unspecified stage: Secondary | ICD-10-CM | POA: Insufficient documentation

## 2022-10-15 DIAGNOSIS — R579 Shock, unspecified: Secondary | ICD-10-CM

## 2022-10-15 DIAGNOSIS — A419 Sepsis, unspecified organism: Secondary | ICD-10-CM | POA: Diagnosis not present

## 2022-10-15 LAB — BASIC METABOLIC PANEL
Anion gap: 17 — ABNORMAL HIGH (ref 5–15)
Anion gap: 30 — ABNORMAL HIGH (ref 5–15)
BUN: 20 mg/dL (ref 8–23)
BUN: 25 mg/dL — ABNORMAL HIGH (ref 8–23)
CO2: 22 mmol/L (ref 22–32)
CO2: 15 mmol/L — ABNORMAL LOW (ref 22–32)
Calcium: 8.6 mg/dL — ABNORMAL LOW (ref 8.9–10.3)
Calcium: 8.9 mg/dL (ref 8.9–10.3)
Chloride: 93 mmol/L — ABNORMAL LOW (ref 98–111)
Chloride: 90 mmol/L — ABNORMAL LOW (ref 98–111)
Creatinine, Ser: 4.18 mg/dL — ABNORMAL HIGH (ref 0.61–1.24)
Creatinine, Ser: 4.79 mg/dL — ABNORMAL HIGH (ref 0.61–1.24)
GFR, Estimated: 14 mL/min — ABNORMAL LOW (ref >60)
GFR, Estimated: 12 mL/min — ABNORMAL LOW (ref >60)
Glucose, Bld: 189 mg/dL — ABNORMAL HIGH (ref 70–99)
Glucose, Bld: 154 mg/dL — ABNORMAL HIGH (ref 70–99)
Potassium: 4.5 mmol/L (ref 3.5–5.1)
Potassium: 5.3 mmol/L — ABNORMAL HIGH (ref 3.5–5.1)
Sodium: 132 mmol/L — ABNORMAL LOW (ref 135–145)
Sodium: 135 mmol/L (ref 135–145)

## 2022-10-15 LAB — CBC
HCT: 31.3 % — ABNORMAL LOW (ref 39.0–52.0)
HCT: 37.4 % — ABNORMAL LOW (ref 39.0–52.0)
Hemoglobin: 9 g/dL — ABNORMAL LOW (ref 13.0–17.0)
Hemoglobin: 10.5 g/dL — ABNORMAL LOW (ref 13.0–17.0)
MCH: 31.4 pg (ref 26.0–34.0)
MCH: 32.6 pg (ref 26.0–34.0)
MCHC: 28.8 g/dL — ABNORMAL LOW (ref 30.0–36.0)
MCHC: 28.1 g/dL — ABNORMAL LOW (ref 30.0–36.0)
MCV: 109.1 fL — ABNORMAL HIGH (ref 80.0–100.0)
MCV: 116.1 fL — ABNORMAL HIGH (ref 80.0–100.0)
Platelets: 148 10*3/uL — ABNORMAL LOW (ref 150–400)
Platelets: 149 10*3/uL — ABNORMAL LOW (ref 150–400)
RBC: 2.87 MIL/uL — ABNORMAL LOW (ref 4.22–5.81)
RBC: 3.22 MIL/uL — ABNORMAL LOW (ref 4.22–5.81)
RDW: 24.4 % — ABNORMAL HIGH (ref 11.5–15.5)
RDW: 24.7 % — ABNORMAL HIGH (ref 11.5–15.5)
WBC: 8.2 10*3/uL (ref 4.0–10.5)
WBC: 16.1 10*3/uL — ABNORMAL HIGH (ref 4.0–10.5)
nRBC: 0 % (ref 0.0–0.2)
nRBC: 0.9 % — ABNORMAL HIGH (ref 0.0–0.2)

## 2022-10-15 LAB — MAGNESIUM: Magnesium: 2.2 mg/dL (ref 1.7–2.4)

## 2022-10-15 LAB — BLOOD CULTURE ID PANEL (REFLEXED) - BCID2

## 2022-10-15 LAB — POCT I-STAT 7, (LYTES, BLD GAS, ICA,H+H)
Acid-base deficit: 7 mmol/L — ABNORMAL HIGH (ref 0.0–2.0)
Bicarbonate: 17 mmol/L — ABNORMAL LOW (ref 20.0–28.0)
Calcium, Ion: 1.09 mmol/L — ABNORMAL LOW (ref 1.15–1.40)
HCT: 32 % — ABNORMAL LOW (ref 39.0–52.0)
Hemoglobin: 10.9 g/dL — ABNORMAL LOW (ref 13.0–17.0)
O2 Saturation: 94 %
Patient temperature: 37.9
Potassium: 4.5 mmol/L (ref 3.5–5.1)
Sodium: 128 mmol/L — ABNORMAL LOW (ref 135–145)
TCO2: 18 mmol/L — ABNORMAL LOW (ref 22–32)
pCO2 arterial: 28.7 mmHg — ABNORMAL LOW (ref 32–48)
pH, Arterial: 7.385 (ref 7.35–7.45)
pO2, Arterial: 74 mmHg — ABNORMAL LOW (ref 83–108)

## 2022-10-15 LAB — ECHOCARDIOGRAM COMPLETE
AR max vel: 2.06 cm2
AV Area VTI: 2.48 cm2
AV Area mean vel: 1.83 cm2
AV Mean grad: 2.7 mmHg
AV Peak grad: 4.2 mmHg
Ao pk vel: 1.02 m/s
S' Lateral: 4.1 cm
Weight: 3671.98 oz

## 2022-10-15 LAB — AMMONIA: Ammonia: 35 umol/L (ref 9–35)

## 2022-10-15 LAB — POCT CBG MONITORING
Glucose, fingerstick: 133 — AB (ref 70–99)
Glucose, fingerstick: 135 — AB (ref 70–99)

## 2022-10-15 LAB — APTT
aPTT: >200 seconds (ref 24–36)
aPTT: 45 seconds — ABNORMAL HIGH (ref 24–36)
aPTT: 42 seconds — ABNORMAL HIGH (ref 24–36)

## 2022-10-15 LAB — GLUCOSE, PLASMA: Glucose, Plasma: 177

## 2022-10-15 LAB — LACTIC ACID, PLASMA
Lactic Acid, Venous: >9 mmol/L (ref 0.5–1.9)
Lactic Acid, Venous: >9 mmol/L (ref 0.5–1.9)
Lactic Acid, Venous: 6 mmol/L (ref 0.5–1.9)

## 2022-10-15 LAB — PROTIME-INR
INR: 1.8 — ABNORMAL HIGH (ref 0.8–1.2)
Prothrombin Time: 20.3 seconds — ABNORMAL HIGH (ref 11.4–15.2)

## 2022-10-15 LAB — GLUCOSE, CAPILLARY: Glucose-Capillary: 192 mg/dL — ABNORMAL HIGH (ref 70–99)

## 2022-10-15 LAB — HEPARIN LEVEL (UNFRACTIONATED): Heparin Unfractionated: >1.1 IU/mL — ABNORMAL HIGH (ref 0.30–0.70)

## 2022-10-15 MED ORDER — SENNA 8.6 MG PO TABS
2.0000 | ORAL_TABLET | Freq: Every day | ORAL | Status: DC
  Administered 2022-10-15 – 2022-10-16 (×2): 17.2 mg via ORAL
  Filled 2022-10-15 (×2): qty 2

## 2022-10-15 MED ORDER — ATROPINE SULFATE 1 MG/10ML IJ SOSY
PREFILLED_SYRINGE | INTRAMUSCULAR | Status: AC
  Filled 2022-10-15: qty 10

## 2022-10-15 MED ORDER — ORAL CARE MOUTH RINSE: 15.0000 mL | OROMUCOSAL | Status: DC | PRN

## 2022-10-15 MED ORDER — PIPERACILLIN-TAZOBACTAM IN DEX 2-0.25 GM/50ML IV SOLN
2.2500 g | Freq: Three times a day (TID) | INTRAVENOUS | Status: DC
  Administered 2022-10-15 – 2022-10-16 (×3): 2.25 g via INTRAVENOUS
  Filled 2022-10-15 (×4): qty 50

## 2022-10-15 MED ORDER — MIDODRINE HCL 5 MG PO TABS
10.0000 mg | ORAL_TABLET | Freq: Three times a day (TID) | ORAL | Status: DC
  Administered 2022-10-15 – 2022-10-16 (×3): 10 mg via ORAL
  Filled 2022-10-15 (×3): qty 2

## 2022-10-15 MED ORDER — LINEZOLID 600 MG/300ML IV SOLN
600.0000 mg | Freq: Once | INTRAVENOUS | Status: AC
  Administered 2022-10-15: 600 mg via INTRAVENOUS
  Filled 2022-10-15: qty 300

## 2022-10-15 MED ORDER — EPINEPHRINE 1 MG/10ML IJ SOSY
PREFILLED_SYRINGE | INTRAMUSCULAR | Status: AC
  Filled 2022-10-15: qty 10

## 2022-10-15 MED ORDER — INSULIN ASPART 100 UNIT/ML IJ SOLN
0.0000 [IU] | Freq: Three times a day (TID) | INTRAMUSCULAR | Status: DC
  Administered 2022-10-15 – 2022-10-16 (×2): 2 [IU] via SUBCUTANEOUS

## 2022-10-15 MED ORDER — DOXERCALCIFEROL 4 MCG/2ML IV SOLN: 6.0000 ug | INTRAVENOUS | Status: DC

## 2022-10-15 MED ORDER — IOHEXOL 350 MG/ML SOLN
75.0000 mL | Freq: Once | INTRAVENOUS | Status: AC | PRN
  Administered 2022-10-15: 75 mL via INTRAVENOUS

## 2022-10-15 MED ORDER — NOREPINEPHRINE 16 MG/250ML-% IV SOLN
0.0000 ug/min | INTRAVENOUS | Status: DC
  Administered 2022-10-15: 10 ug/min via INTRAVENOUS
  Filled 2022-10-15 (×2): qty 250

## 2022-10-15 MED ORDER — SODIUM CHLORIDE 0.9 % IV SOLN: INTRAVENOUS | Status: DC

## 2022-10-15 MED ORDER — PERFLUTREN LIPID MICROSPHERE
1.0000 mL | INTRAVENOUS | Status: AC | PRN
  Administered 2022-10-15: 2 mL via INTRAVENOUS

## 2022-10-15 MED ORDER — POLYETHYLENE GLYCOL 3350 17 G PO PACK
17.0000 g | PACK | Freq: Every day | ORAL | Status: DC
  Administered 2022-10-16: 17 g via ORAL
  Filled 2022-10-15: qty 1

## 2022-10-15 MED ORDER — HEPARIN (PORCINE) 25000 UT/250ML-% IV SOLN: 1300.0000 [IU]/h | INTRAVENOUS | Status: DC

## 2022-10-15 MED ORDER — FAMOTIDINE IN NACL 20-0.9 MG/50ML-% IV SOLN
20.0000 mg | Freq: Once | INTRAVENOUS | Status: AC
  Administered 2022-10-15: 20 mg via INTRAVENOUS
  Filled 2022-10-15: qty 50

## 2022-10-15 MED ORDER — NOREPINEPHRINE 4 MG/250ML-% IV SOLN
2.0000 ug/min | INTRAVENOUS | Status: DC
  Administered 2022-10-15: 10 ug/min via INTRAVENOUS
  Filled 2022-10-15: qty 250

## 2022-10-15 MED ORDER — SODIUM BICARBONATE 8.4 % IV SOLN
100.0000 meq | Freq: Once | INTRAVENOUS | Status: AC
  Administered 2022-10-15: 100 meq via INTRAVENOUS
  Filled 2022-10-15: qty 100

## 2022-10-15 MED ORDER — ORAL CARE MOUTH RINSE
15.0000 mL | OROMUCOSAL | Status: DC
  Administered 2022-10-15 – 2022-10-16 (×5): 15 mL via OROMUCOSAL

## 2022-10-15 MED ORDER — INSULIN ASPART 100 UNIT/ML IJ SOLN: 0.0000 [IU] | Freq: Every day | INTRAMUSCULAR | Status: DC

## 2022-10-15 MED ORDER — ROSUVASTATIN CALCIUM 5 MG PO TABS
10.0000 mg | ORAL_TABLET | Freq: Every day | ORAL | Status: DC
  Administered 2022-10-15 – 2022-10-16 (×2): 10 mg via ORAL
  Filled 2022-10-15 (×2): qty 2

## 2022-10-15 NOTE — Procedures (Signed)
Routine EEG Report  Louis [name] is a 76 y.o. male with a history of altered mental status who is undergoing an EEG to evaluate for seizures.  Report: This EEG was acquired with electrodes placed according to the International 10-20 electrode system (including Fp1, Fp2, F3, F4, C3, C4, P3, P4, O1, O2, T3, T4, T5, T6, A1, A2, Fz, Cz, Pz). The following electrodes were missing or displaced: none.  The occipital dominant rhythm was 5-6 Hz. This activity is reactive to stimulation. Drowsiness was manifested by background fragmentation; deeper stages of sleep were identified by K complexes and sleep spindles. There was no focal slowing. There were no interictal epileptiform discharges. There were no electrographic seizures identified. Photic stimulation and hyperventilation were not performed.  Impression and clinical correlation: This EEG was obtained while awake and asleep and is abnormal due to mild diffuse slowing indicative of global cerebral dysfunction. Epileptiform abnormalities were not seen during this recording.  Su Monks, MD [facility] [phone]  If 7pm- 7am, please page neurology on call as listed in [place].

## 2022-10-15 NOTE — Progress Notes (Signed)
Unable to obtain CBG from pt's fingertips after multiple attempts by multiple staff.  Pt has a dexcom in place (right upper arm).  Discussed situation with Dr. Tacy Learn, orders received to utilize the dexcom for readings.

## 2022-10-15 NOTE — Progress Notes (Signed)
EEG complete - results pending 

## 2022-10-15 NOTE — Progress Notes (Signed)
NAME:  Louis [name], MRN:  [id number], DOB:  04/22/1946, LOS: 1 ADMISSION DATE:  10/08/2022, CONSULTATION DATE:  11/25 REFERRING MD:  Kathrynn Humble, CHIEF COMPLAINT:  hypotension   History of Present Illness:  76 year old male patient with history of end-stage renal disease receives dialysis Tuesday Thursday Saturday, recently hospitalized for osteomyelitis involving the left hand, this was following recent left upper arm AV fistula ligation.  Ultimately required surgical intervention including extensive debridement, and amputation of the left thumb.  He was ultimately discharged on 10/25, with plans to complete a total of 6 weeks of intravenous Fortaz and daptomycin which she has since done.  He has been followed in the orthopedic clinic, his sutures have since been removed, follow-up exams surgical wise have been encouraging without evidence of infection. He presented to the emergency room on 11/25 with family reporting approximately 2 to 2-1/2-day history of progressive weakness, increased fatigue, and poor p.o. intake.  He has been weak since his hospital/rehab discharge, however at baseline had gotten to a point where he could stand and pivot to a chair with a two-person assist.  His weakness had gotten so bad on day of admission he could not not stand with full assist of 2 people.  He was able to eventually get to dialysis on 11/25 at which time he was noted to be fairly hypotensive during the case with systolic blood pressure in the 80s.  He has been on midodrine since the early part of October, instructed to take prior to dialysis.  Because of his weakness, and hypotension, he presented to the emergency room.  Pertinent  Medical History  End-stage renal disease TTS, peripheral vascular disease, recent osteomyelitis requiring amputation of left thumb, hyperlipidemia, obesity with BMI 30-39.9, hypotension, requiring midodrine prior to dialysis, type 2 diabetes atrial fibrillation on DOAC, GERD,  esophageal stricture with esophageal dysphagia Chronic gastric ulcer  Significant Hospital Events: Including procedures, antibiotic start and stop dates in addition to other pertinent events   11/25 admitted with hypotension and weakness  Interim History / Subjective:  Patient remains on low-dose vasopressor support CT chest PE protocol came back positive for bilateral PEs and moderate to large bilateral pleural effusion left more than right Remain afebrile Complaining of constipation, last BM was 3 days ago  Objective   Blood pressure 92/62, pulse 92, temperature 98 F (36.7 C), temperature source Oral, resp. rate (!) 23, weight 104.1 kg, SpO2 95 %.        Intake/Output Summary (Last 24 hours) at 10/15/2022 0745 Last data filed at 10/15/2022 0500 Gross per 24 hour  Intake 275.11 ml  Output 0 ml  Net 275.11 ml   Filed Weights   10/15/22 0500  Weight: 104.1 kg    Examination: Physical exam: General: Acute on chronically ill-appearing obese male, lying on the bed HEENT: [place]/AT, eyes anicteric.  moist mucus membranes Neuro: Alert, awake following commands Chest: Coarse breath sounds, no wheezes or rhonchi Heart: Irregularly irregular, no murmurs or gallops Abdomen: Soft, nontender, nondistended, bowel sounds present Skin: No rash   Resolved Hospital Problem list     Assessment & Plan:  Septic shock likely related to aspiration pneumonia  Patient remains on low-dose vasopressor support Follow-up cultures Continue IV antibiotic with cefepime He completed antibiotic therapy for osteomyelitis of left thumb and index finger for 6 weeks Titrate vasopressor support with map goal 65 Continue midodrine  End-stage renal disease on hemodialysis Hyponatremia Nephrology is consulted Hemodialysis per schedule  Dark stools Patient's hemoglobin is  stable Patient wife stated that he took Pepto-Bismol for few days that could be reason his stools were dark Continue  Protonix Watch for signs of bleeding  Esophageal stricture with severe esophageal dysmotility s/p dilatation. He underwent endoscopy with esophageal dilatation by Dr. Fuller Plan on 10/17  Family is reporting coughing again "choking on p.o.'s" Continue with PPI Speech and swallow evaluation  Bilateral pulmonary emboli CT angiogram chest equivocal for bilateral PE Patient was supposed to be on DOAC which he held because of dark stools Continue heparin infusion for now, resume Eliquis prior to discharge  Bilateral moderate to large pleural effusion Patient has large left-sided pleural effusion and moderate right-sided pleural effusion, has been persistent for quite some time Will perform thoracentesis on left and send fluid for studies  Permanent atrial fibrillation Heart rate is well-controlled Continue heparin infusion for stroke prophylaxis  Diabetes type 2 Last hemoglobin A1c was 6.2 Continue sliding scale insulin with CBG goal 140-180  Best Practice (right click and "Reselect all SmartList Selections" daily)   Diet/type: Swallow evaluation DVT prophylaxis: Systemic heparin GI prophylaxis: N/A Lines: Dialysis Catheter Foley:  N/A Code Status:  full code Last date of multidisciplinary goals of care discussion [11/26: Patient wife was updated at bedside, recommend continue full scope of care]  Labs   CBC: Recent Labs  Lab 09/25/2022 1345 10/10/2022 2200 10/15/22 0543  WBC 9.0 8.8 8.2  NEUTROABS 7.6  --   --   HGB 10.5* 9.4* 9.0*  HCT 34.7* 31.4* 31.3*  MCV 106.4* 107.2* 109.1*  PLT 159 168 148*     Basic Metabolic Panel: Recent Labs  Lab 10/13/2022 1345 10/01/2022 2200 10/15/22 0543  NA 133* 131* 132*  K 4.4 4.6 4.5  CL 92* 93* 93*  CO2 '26 23 22  '$ GLUCOSE 108* 180* 189*  BUN '15 18 20  '$ CREATININE 3.24* 3.80* 4.18*  CALCIUM 8.9 8.6* 8.6*  MG  --   --  2.2  PHOS  --  3.9  --     GFR: Estimated Creatinine Clearance: 17.3 mL/min (A) (by C-G formula based on SCr of  4.18 mg/dL (H)). Recent Labs  Lab 10/19/2022 1345 10/03/2022 1401 10/06/2022 1801 10/13/2022 2140 10/03/2022 2200 10/15/22 0543  PROCALCITON  --   --  0.95  --   --   --   WBC 9.0  --   --   --  8.8 8.2  LATICACIDVEN  --  2.4* 2.6* 2.7*  --   --      Liver Function Tests: Recent Labs  Lab 09/26/2022 1345 10/07/2022 2200  AST 72*  --   ALT 35  --   ALKPHOS 220*  --   BILITOT 1.3*  --   PROT 6.4*  --   ALBUMIN 2.2* 2.0*    Recent Labs  Lab 10/11/2022 1801  LIPASE 39   No results for input(s): "AMMONIA" in the last 168 hours.  ABG    Component Value Date/Time   PHART 7.463 (H) 12/18/2018 1233   PCO2ART 44.4 12/18/2018 1233   PO2ART 82.0 (L) 12/18/2018 1233   HCO3 32.8 (H) 12/18/2018 1233   HCO3 31.7 (H) 12/18/2018 1233   TCO2 29 08/29/2022 1420   O2SAT 76.0 12/18/2018 1233   O2SAT 97.0 12/18/2018 1233     Coagulation Profile: Recent Labs  Lab 10/03/2022 1345  INR 1.6*     Cardiac Enzymes: No results for input(s): "CKTOTAL", "CKMB", "CKMBINDEX", "TROPONINI" in the last 168 hours.  HbA1C: Hemoglobin A1C  Date/Time Value  Ref Range Status  03/12/2022 12:00 AM 6.2  Final  03/18/2021 01:31 PM 6.6 (A) 4.0 - 5.6 % Final  12/17/2020 01:38 PM 5.8 (A) 4.0 - 5.6 % Final  09/09/2020 12:00 AM 6.5  Final    CBG: Recent Labs  Lab 09/23/2022 2050 10/15/22 0704  GLUCAP 183* 192*    Total critical care time: 38 minutes  Performed by: Elim care time was exclusive of separately billable procedures and treating other patients.   Critical care was necessary to treat or prevent imminent or life-threatening deterioration.   Critical care was time spent personally by me on the following activities: development of treatment plan with patient and/or surrogate as well as nursing, discussions with consultants, evaluation of patient's response to treatment, examination of patient, obtaining history from patient or surrogate, ordering and performing treatments and  interventions, ordering and review of laboratory studies, ordering and review of radiographic studies, pulse oximetry and re-evaluation of patient's condition.   Jacky Kindle, MD [facility] See Amion for pager If no response to pager, please call [phone] until 7pm After 7pm, Please call E-link [phone]

## 2022-10-15 NOTE — Progress Notes (Signed)
ANTICOAGULATION CONSULT NOTE - Follow Up  Pharmacy Consult for heparin gtt Indication: pulmonary embolus  Allergies  Allergen Reactions   Hydrocodone Nausea And Vomiting    Other reaction(s): GI Upset (intolerance) Projectile vomiting    Oxycodone Nausea And Vomiting    Other reaction(s): GI Upset (intolerance), Vomiting (intolerance) Projectile vomiting    Vancomycin Anaphylaxis    ORAL VANCOMYCIN for C diff.   Dacarbazine Other (See Comments)    Unknown reaction   Latex Itching and Rash   Tape Dermatitis, Itching and Rash    Patch used at dialysis    Patient Measurements: Weight: 104.1 kg (229 lb 8 oz) Heparin Dosing Weight: 88.5 kg Historical weight 101 kg   Vital Signs: Temp: 98 F (36.7 C) (11/26 0400) Temp Source: Oral (11/26 0400) BP: 83/69 (11/26 0515) Pulse Rate: 88 (11/26 0515)  Labs: Recent Labs    10/19/2022 1345 09/21/2022 2200 10/15/22 0543  HGB 10.5* 9.4* 9.0*  HCT 34.7* 31.4* 31.3*  PLT 159 168 148*  LABPROT 18.7*  --   --   INR 1.6*  --   --   HEPARINUNFRC  --   --  >1.10*  CREATININE 3.24* 3.80* 4.18*     Estimated Creatinine Clearance: 17.3 mL/min (A) (by C-G formula based on SCr of 4.18 mg/dL (H)).   Medical History: Past Medical History:  Diagnosis Date   Arthritis    Brittle bones    per pt, has soft bones in right foot/wears boot cast!   CAD (coronary artery disease)    Cancer ([place])    skin cancer on arm   Cataract    Bil/ surg scheduled for right eye 01/18/17/ left eye 02/08/17   Charcot ankle, right 2019   CHF (congestive heart failure) ([place]) 2015   Clostridium difficile colitis 10/09/2022   Diabetes mellitus    Type 2   ESRD (end stage renal disease) on dialysis ([place])    Tu/Th/Sa Dialysis   Heart failure, diastolic (HCC)    History of kidney stones    Hyperlipidemia    Hypertension    Macular degeneration disease    Macular edema 2014   OSA on CPAP    Paroxysmal atrial fibrillation (HCC)    Personal history of  colonic polyps - adenomas 01/28/2014   Shortness of breath dyspnea    Syncope and collapse    Assessment: 76 yo M with PMH afib on Eliquis 5 BID at home. According to patient family member at bedside patient received his dose of Eliquis '5mg'$  this morning (11/25). Recent history of dark stools, but per family member this is resolved and pt has had 2 brown bowel movements. Will target low goal per MD. CT on 11/25 showed equivocal PE with RV/LV ratio of 1 which would signify right heart strain if PE is present. Pharmacy consulted to dose/manage heparin gtt in setting of likely VTE.   Heparin level >1.1 as expected with recent DOAC. aPTT is supratherapeutic at >200. Patient was a lab draw that was taken from right wrist. Heparin infusing in upper right arm. Hgb stable at 9 and platelets trending slightly down to 148. CCM planning thoracentesis today.   Goal of Therapy:  Heparin level 0.3-0.5 units/ml - per MD aPTT 66-85 s - per MD Monitor platelets by anticoagulation protocol: Yes   Plan:  Hold heparin for 1 hour Decrease heparin infusion to 1300 units/hr 8hr aPTT at 0500 Daily HL, aPTT, CBC -will trend both aPTT/HL until correlating F/u transition back to enteral  therapy  Thank you for allowing pharmacy to participate in this patient's care.  Reatha Harps, PharmD PGY2 Pharmacy Resident 10/15/2022 7:00 AM Check [email or web address] for unit specific pharmacy number

## 2022-10-15 NOTE — Progress Notes (Addendum)
[facility] Progress Note Patient Name: Louis [name] DOB: [birth year] MRN: [id number]   Date of Service  10/15/2022  HPI/Events of Note  Notified of some belly pain. Seen on camera. Is on 11 mic/min levophed , fairly stable. Had abdominal imaging done yesterday. Started on Abx today and patient's wife notes he is very sensitive to those and has belly discomfort from antibiotics . Has not eaten all day so could have a component of reflux as well. Belly is soft and non tender per RN, not distended. LA was high earlier as well.   eICU Interventions  Pepcid x 1 Check LA now      Intervention Category Major Interventions: Sepsis - evaluation and management  Arshawn Valdez G [facility] Billing 10/15/2022, 9:28 PM  Addendum at 2:40 am Repeat abdominal pain  Prior checked LA was better with no intervention  MRI did not anything acute even though it was limited Am told he is NPO for possible thora in AM , ultram could not be used Low dose fentanyl x 1  Abdominal x ray ordered

## 2022-10-15 NOTE — Progress Notes (Signed)
PHARMACY - PHYSICIAN COMMUNICATION CRITICAL VALUE ALERT - BLOOD CULTURE IDENTIFICATION (BCID)  Louis [name] is an 76 y.o. male who presented to [facility] on 10/07/2022 with a chief complaint of shortness of breath  Assessment:  1/4 GPC in clusters, staph epi, meca resistance detected   Name of physician (or Provider) Contacted: Dr. Tacy Learn  Current antibiotics: Cefepime   Changes to prescribed antibiotics recommended:  None, likely contaminant   Results for orders placed or performed during the hospital encounter of 10/04/2022  Blood Culture ID Panel (Reflexed) (Collected: 09/28/2022  3:17 PM)  Result Value Ref Range   Enterococcus faecalis NOT DETECTED NOT DETECTED   Enterococcus Faecium NOT DETECTED NOT DETECTED   Listeria monocytogenes NOT DETECTED NOT DETECTED   Staphylococcus species DETECTED (A) NOT DETECTED   Staphylococcus aureus (BCID) NOT DETECTED NOT DETECTED   Staphylococcus epidermidis DETECTED (A) NOT DETECTED   Staphylococcus lugdunensis NOT DETECTED NOT DETECTED   Streptococcus species NOT DETECTED NOT DETECTED   Streptococcus agalactiae NOT DETECTED NOT DETECTED   Streptococcus pneumoniae NOT DETECTED NOT DETECTED   Streptococcus pyogenes NOT DETECTED NOT DETECTED   A.calcoaceticus-baumannii NOT DETECTED NOT DETECTED   Bacteroides fragilis NOT DETECTED NOT DETECTED   Enterobacterales NOT DETECTED NOT DETECTED   Enterobacter cloacae complex NOT DETECTED NOT DETECTED   Escherichia coli NOT DETECTED NOT DETECTED   Klebsiella aerogenes NOT DETECTED NOT DETECTED   Klebsiella oxytoca NOT DETECTED NOT DETECTED   Klebsiella pneumoniae NOT DETECTED NOT DETECTED   Proteus species NOT DETECTED NOT DETECTED   Salmonella species NOT DETECTED NOT DETECTED   Serratia marcescens NOT DETECTED NOT DETECTED   Haemophilus influenzae NOT DETECTED NOT DETECTED   Neisseria meningitidis NOT DETECTED NOT DETECTED   Pseudomonas aeruginosa NOT DETECTED NOT DETECTED   Stenotrophomonas  maltophilia NOT DETECTED NOT DETECTED   Candida albicans NOT DETECTED NOT DETECTED   Candida auris NOT DETECTED NOT DETECTED   Candida glabrata NOT DETECTED NOT DETECTED   Candida krusei NOT DETECTED NOT DETECTED   Candida parapsilosis NOT DETECTED NOT DETECTED   Candida tropicalis NOT DETECTED NOT DETECTED   Cryptococcus neoformans/gattii NOT DETECTED NOT DETECTED   Methicillin resistance mecA/C DETECTED (A) NOT DETECTED    Thank you for allowing pharmacy to participate in this patient's care.  Reatha Harps, PharmD PGY2 Pharmacy Resident 10/15/2022 10:46 AM Check [email or web address] for unit specific pharmacy number

## 2022-10-15 NOTE — Progress Notes (Signed)
  Echocardiogram 2D Echocardiogram has been performed.  Louis [name] 10/15/2022, 12:22 PM

## 2022-10-15 NOTE — Progress Notes (Addendum)
I was called into the patient room as he was found encephalopathic   Upon my evaluation patient opens his eyes with vocal stimuli, remain aphasic, moving bilateral upper extremities, tremulous not following commands  Stat ABG was done: Showing pH 7.38, pCO2 28 and PaO2 74 with 94% oxygen saturation on room air  Blood sugar was checked it was 123  As it was sudden onset, stroke code was called Patient had CT head and CT angiogram of head and neck which was negative for acute finding and LVO.  Discussed with  Lactate was done which came back >9, BMP is pending Patient's vasopressor requirement went up from 2 mics of levo to 10 mics of Levophed that could be due to acute metabolic acidosis  This lactic acidosis could be resulting from seizures versus worsening septic shock Patient is afebrile  LTM is ordered  He is on antibiotics with cefepime, which has known side effect of neurotoxicity especially in patient with renal failure, will switch it to Zosyn  One of the 4 blood culture bottles came back positive for MRSE, it is likely contaminant but will give him 1 dose of IV Zyvox (allergic to vancomycin) considering sudden change in his condition  Patient's wife was updated at bedside, please see Ipal note   Additional critical care time: 49 minutes  Performed by: Jacky Kindle   Critical care time was exclusive of separately billable procedures and treating other patients.   Critical care was necessary to treat or prevent imminent or life-threatening deterioration.   Critical care was time spent personally by me on the following activities: development of treatment plan with patient and/or surrogate as well as nursing, discussions with consultants, evaluation of patient's response to treatment, examination of patient, obtaining history from patient or surrogate, ordering and performing treatments and interventions, ordering and review of laboratory studies, ordering and review of  radiographic studies, pulse oximetry and re-evaluation of patient's condition.   Jacky Kindle, MD  Pulmonary Critical Care See Amion for pager If no response to pager, please call [phone] until 7pm After 7pm, Please call E-link [phone]

## 2022-10-15 NOTE — Consult Note (Signed)
Stroke Neurology Consultation Note  Consult Requested by: Dr. Tacy Learn  Reason for Consult: code stroke  Consult Date: 10/15/22   The history was obtained from the Dr. Tacy Learn and RN.  During history and examination, all items were able to obtain unless otherwise noted.  History of Present Illness:  Louis [name] is a 76 y.o. Caucasian male has PMH of ESRD on HD, hypotension on midodrine prior to HD, HLD, obesity, DM, afib on eliquis, esophageal stricture, gastric ulcer and recent admission for osteomyelitis involving the left hand and required surgical intervention including extensive debridement, and amputation of the left thumb. He was admitted 11/25 for progressive generalized weakness, decreased appetite and lethargy more prominent for 2-3 days. He was also found to have significant hypotension during dialysis and he was sent to ER for evaluation. He was put on pressors, concerning for septic shock and put on Abx. CTA chest found b/l PE and b/l pleural effusion. He was put on heparin IV after admission and plan for thoracentesis. His last eliquis dose was 11/25 am at home and heparin IV was stopped today 1330 in preparation for thoracentesis. Heparin level this am was high > 1.10 and APTT > 200. Na this am 132 but on repeat in pm it was 128.   Per RN, pt was lethargic but still able to answer questions and carry conversation around 2:30pm. However, around 3 pm, pt was found to be less responsive, altered mental status, able to open eyes on voice but not answer questions, not following commands. Code stroke activated.   On my exam, pt open eyes on voice, able to tell me first name and said "no" when I asked him about month or year. Otherwise, not following commands or verbal. Weak b/l UE but able to against gravity, BLEs in boots not moving much which was his baseline. Code stroke CT head no acute finding and CTA head and neck no LVO.   LSN: 1430pm tPA Given: No: within 48 h of eliquis last dose  and within 3 hours of heparin IV with high APTT this am and low suspicious for stroke IR: no, no LVO  Past Medical History:  Diagnosis Date   Arthritis    Brittle bones    per pt, has soft bones in right foot/wears boot cast!   CAD (coronary artery disease)    Cancer ([place])    skin cancer on arm   Cataract    Bil/ surg scheduled for right eye 01/18/17/ left eye 02/08/17   Charcot ankle, right 2019   CHF (congestive heart failure) ([place]) 2015   Clostridium difficile colitis 10/09/2022   Diabetes mellitus    Type 2   ESRD (end stage renal disease) on dialysis ([place])    [facility]   Heart failure, diastolic ([place])    History of kidney stones    Hyperlipidemia    Hypertension    Macular degeneration disease    Macular edema 2014   OSA on CPAP    Paroxysmal atrial fibrillation ([place])    Personal history of colonic polyps - adenomas 01/28/2014   Shortness of breath dyspnea    Syncope and collapse     Past Surgical History:  Procedure Laterality Date   AV FISTULA PLACEMENT Left 09/01/2020   Procedure: LEFT ARM ARTERIOVENOUS (AV) FISTULA;  Surgeon: Waynetta Sandy, MD;  Location: [facility];  Service: Vascular;  Laterality: Left;   [place] Left 10/27/2020   Procedure: LEFT SECOND STAGE BASILIC VEIN FISTULA TRANSPOSITION;  Surgeon: Waynetta Sandy, MD;  Location: [place];  Service: Vascular;  Laterality: Left;   BIOPSY  09/05/2022   Procedure: BIOPSY;  Surgeon: Ladene Artist, MD;  Location: [facility] ENDOSCOPY;  Service: Gastroenterology;;   CARPAL TUNNEL RELEASE     left hand   COLONOSCOPY     ESOPHAGOGASTRODUODENOSCOPY (EGD) WITH PROPOFOL N/A 09/05/2022   Procedure: ESOPHAGOGASTRODUODENOSCOPY (EGD) WITH PROPOFOL;  Surgeon: Ladene Artist, MD;  Location: [place];  Service: Gastroenterology;  Laterality: N/A;   I & D EXTREMITY Left 08/30/2022   Procedure: IRRIGATION AND DEBRIDEMENT LEFT THUMB;  Surgeon: Iran Planas, MD;  Location: [place];   Service: Orthopedics;  Laterality: Left;   I & D EXTREMITY Left 09/04/2022   Procedure: IRRIGATION AND DEBRIDEMENT LEFT THUMB POSSIBLE AMPUTATION;  Surgeon: Iran Planas, MD;  Location: [place];  Service: Orthopedics;  Laterality: Left;   INSERTION OF DIALYSIS CATHETER Left 08/29/2022   Procedure: INSERTION OF DIALYSIS CATHETER;  Surgeon: Angelia Mould, MD;  Location: [place];  Service: Vascular;  Laterality: Left;   INTRAVASCULAR PRESSURE WIRE/FFR STUDY N/A 12/18/2018   Procedure: INTRAVASCULAR PRESSURE WIRE/FFR STUDY;  Surgeon: Wellington Hampshire, MD;  Location: [facility] CV [facility];  Service: Cardiovascular;  Laterality: N/A;   IR FLUORO GUIDE CV LINE RIGHT  12/16/2018   IR US GUIDE VASC ACCESS RIGHT  12/16/2018   KNEE ARTHROSCOPY Right 09/13/2016   [facility], Dr. Dorna Leitz   LIGATION OF ARTERIOVENOUS  FISTULA Left 08/29/2022   Procedure: LIGATION OF ARTERIOVENOUS  FISTULA LEFT ARM;  Surgeon: Angelia Mould, MD;  Location: [name];  Service: Vascular;  Laterality: Left;   PILONIDAL CYST EXCISION     RIGHT/LEFT HEART CATH AND CORONARY ANGIOGRAPHY N/A 12/18/2018   Procedure: RIGHT/LEFT HEART CATH AND CORONARY ANGIOGRAPHY;  Surgeon: Wellington Hampshire, MD;  Location: [place] [facility];  Service: Cardiovascular;  Laterality: N/A;   SAVORY DILATION N/A 09/05/2022   Procedure: SAVORY DILATION;  Surgeon: Ladene Artist, MD;  Location: [facility];  Service: Gastroenterology;  Laterality: N/A;    Family History  Problem Relation Age of Onset   Diabetes Mother    Kidney disease Mother    Diabetes Father    Coronary artery disease Father    Diabetes Brother    Diabetes Sister    Prostate cancer Maternal Uncle    Diabetes Maternal Grandmother    Cancer Paternal Grandmother        unknown   Heart attack Paternal Grandfather    Colon cancer Neg Hx     Social History:  reports that he quit smoking about 45 years ago. His smoking use included cigarettes. He has a 10.00  pack-year smoking history. He has never used smokeless tobacco. He reports that he does not drink alcohol and does not use drugs.  Allergies:  Allergies  Allergen Reactions   Hydrocodone Nausea And Vomiting    Other reaction(s): GI Upset (intolerance) Projectile vomiting    Oxycodone Nausea And Vomiting    Other reaction(s): GI Upset (intolerance), Vomiting (intolerance) Projectile vomiting    Vancomycin Anaphylaxis    ORAL VANCOMYCIN for C diff.   Dacarbazine Other (See Comments)    Unknown reaction   Latex Itching and Rash   Tape Dermatitis, Itching and Rash    Patch used at dialysis    No current facility-administered medications on file prior to encounter.   Current Outpatient Medications on File Prior to Encounter  Medication Sig Dispense Refill   Cholecalciferol (VITAMIN  D3) 50 MCG (2000 UT) TABS Take 2,000 Units by mouth daily.      Coenzyme Q10 (COQ10) 200 MG CAPS Take 200 mg by mouth daily.     Doxercalciferol (HECTOROL IV) Doxercalciferol (Hectorol)     ELIQUIS 5 MG TABS tablet Take 1 tablet (5 mg total) by mouth 2 (two) times daily. 180 tablet 1   lidocaine-prilocaine (EMLA) cream SMARTSIG:sparingly Topical As Directed     midodrine (PROAMATINE) 10 MG tablet Take 10 mg by mouth as directed. Take 30 minutes prior to dialysis treatment on Tuesday, Thursday and Saturdays     nitroGLYCERIN (NITROSTAT) 0.4 MG SL tablet Place 1 tablet (0.4 mg total) under the tongue every 5 (five) minutes as needed for chest pain. 25 tablet 11   NOVOLIN N 100 UNIT/ML injection Inject 10 Units into the skin 2 (two) times daily before a meal.     NOVOLIN R 100 UNIT/ML injection Inject 1-15 Units into the skin 3 (three) times daily with meals. Sliding Scale  5   Probiotic Product (PROBIOTIC DAILY PO) Take 420 mg by mouth daily.     rosuvastatin (CRESTOR) 40 MG tablet Take 1 tablet by mouth once daily (Patient taking differently: Take 40 mg by mouth daily.) 90 tablet 3   silver sulfADIAZINE  (SILVADENE) 1 % cream Apply 1 Application topically daily as needed (wound care, itching).     torsemide (DEMADEX) 100 MG tablet See admin instructions. Take one tablet (100 mg) by mouth once daily on non dialysis days.     Ubiquinol 200 MG CAPS Take 1 capsule by mouth daily.     B-D INS SYR ULTRAFINE 1CC/31G 31G X 5/16" 1 ML MISC      blood glucose meter kit and supplies Use to check blood sugars daily E11.65     cefTAZidime 2 g in sodium chloride 0.9 % 100 mL Inject 2 g into the vein every Monday, Wednesday, and Friday with hemodialysis.     Continuous Blood Gluc Receiver (FREESTYLE LIBRE 14 DAY READER) DEVI Dx DM E11.22 Check blood sugar 4 times daily. 1 each prn   Continuous Blood Gluc Sensor (FREESTYLE LIBRE 14 DAY SENSOR) MISC Dx DM E11.22 Check blood sugar 4 times daily. 6 each prn   DAPTOmycin 650 mg in sodium chloride 0.9 % 50 mL Inject 650 mg into the vein as directed. After dialysis on Mondays and Wednesdays.     DAPTOmycin 950 mg in sodium chloride 0.9 % 50 mL Inject 950 mg into the vein every Friday at 6 PM. After dialysis on Fridays.     furosemide (LASIX) 40 MG tablet TAKE 1 TABLET BY MOUTH ONCE DAILY AS NEEDED (Patient not taking: Reported on 09/27/2022) 30 tablet 0   Methoxy PEG-Epoetin Beta (MIRCERA IJ) Mircera     ONETOUCH ULTRA test strip USE 1 STRIP TO CHECK GLUCOSE THREE TIMES DAILY 100 each 6   traMADol (ULTRAM) 50 MG tablet  (Patient not taking: Reported on 10/09/2022)      Review of Systems: A full ROS was attempted today and was not able to be performed due to AMS.  Physical Examination: Temp:  [97.7 F (36.5 C)-98 F (36.7 C)] 98 F (36.7 C) (11/26 0400) Pulse Rate:  [31-112] 76 (11/26 1400) Resp:  [1-53] 16 (11/26 1400) BP: (40-123)/(17-99) 72/55 (11/26 1400) SpO2:  [88 %-100 %] 92 % (11/26 1400) Weight:  [104.1 kg] 104.1 kg (11/26 0500)  General - obesity, well developed, lethargic.    Ophthalmologic - fundi not visualized  due to noncooperation.     Cardiovascular - irregularly irregular heart rate and rhythm.  Neuro - lethargic, eyes close but able to open briefly on voice, able to tell me his first name but did not answer other questions except said "no" when I asked him the year or month. Did not follow simple commands. Eyes midline, not blinking to visual threat bilaterally, PERRL. No significant facial asymmetry. Tongue protrusion not cooperative. Bilateral UEs drift to bed within 10 secs, but seems equal strength. B/l in boots flicker movement with pain, chronic per RN. Sensation, coordination and gait not tested.  NIH Stroke Scale  Level Of Consciousness 0=Alert; keenly responsive 1=Arouse to minor stimulation 2=Requires repeated stimulation to arouse or movements to pain 3=postures or unresponsive 2  LOC Questions to Month and Age 69=Answers both questions correctly 1=Answers one question correctly or dysarthria/intubated/trauma/language barrier 2=Answers neither question correctly or aphasia 2  LOC Commands      -Open/Close eyes     -Open/close grip     -Pantomime commands if communication barrier 0=Performs both tasks correctly 1=Performs one task correctly 2=Performs neighter task correctly 2  Best Gaze     -Only assess horizontal gaze 0=Normal 1=Partial gaze palsy 2=Forced deviation, or total gaze paresis 0  Visual 0=No visual loss 1=Partial hemianopia 2=Complete hemianopia 3=Bilateral hemianopia (blind including cortical blindness) 3  Facial Palsy     -Use grimace if obtunded 0=Normal symmetrical movement 1=Minor paralysis (asymmetry) 2=Partial paralysis (lower face) 3=Complete paralysis (upper and lower face) 0  Motor  0=No drift for 10/5 seconds 1=Drift, but does not hit bed 2=Some antigravity effort, hits  bed 3=No effort against gravity, limb falls 4=No movement 0=Amputation/joint fusion Right Arm 2     Leg 2    Left Arm 3     Leg 3  Limb Ataxia     - FNT/HTS 0=Absent or does not understand or  paralyzed or amputation/joint fusion 1=Present in one limb 2=Present in two limbs 0  Sensory 0=Normal 1=Mild to moderate sensory loss 2=Severe to total sensory loss or coma/unresponsive 2  Best Language 0=No aphasia, normal 1=Mild to moderate aphasia 2=Severe aphasia 3=Mute, global aphasia, or coma/unresponsive 2  Dysarthria 0=Normal 1=Mild to moderate 2=Severe, unintelligible or mute/anarthric 0=intubated/unable to test 2  Extinction/Neglect 0=No abnormality 1=visual/tactile/auditory/spatia/personal inattention/Extinction to bilateral simultaneous stimulation 2=Profound neglect/extinction more than 1 modality  2  Total   27     Data Reviewed: ECHOCARDIOGRAM COMPLETE  Result Date: 10/15/2022    ECHOCARDIOGRAM REPORT   Patient Name:   [name] Date of Exam: 10/15/2022 Medical Rec #:  [id number]       Height:       67.0 in Accession #:    [id number]      Weight:       229.5 lb Date of Birth:  Apr 20, 1946       BSA:          2.144 m Patient Age:    86 years        BP:           83/57 mmHg Patient Gender: M               HR:           87 bpm. Exam Location:  Inpatient Procedure: 2D Echo, Cardiac Doppler, Color Doppler and Intracardiac            Opacification Agent Indications:    Atrial fibrillation  History:  Patient has prior history of Echocardiogram examinations, most                 recent 12/15/2018. CAD, Arrythmias:RBBB and Atrial Fibrillation;                 Risk Factors:Diabetes, Sleep Apnea, Dyslipidemia and Former                 Smoker. CKD.  Sonographer:    Clayton Lefort RDCS (AE) Referring Phys: [place]  1. Left ventricular ejection fraction, by estimation, is 45 to 50%. The left ventricle has mildly decreased function. The left ventricle demonstrates regional wall motion abnormalities (see scoring diagram/findings for description). Left ventricular diastolic function could not be evaluated. There is mild hypokinesis of the left ventricular, entire  apical segment. Abnormal septal motion suggests increased work of breathing (e.g. COPD exacerbation).  2. Right ventricular systolic function is moderately reduced. The right ventricular size is moderately enlarged. Tricuspid regurgitation signal is inadequate for assessing PA pressure.  3. Left atrial size was mildly dilated.  4. Right atrial size was mildly dilated.  5. The mitral valve is normal in structure. Mild to moderate mitral valve regurgitation. No evidence of mitral stenosis.  6. The aortic valve is tricuspid. There is mild calcification of the aortic valve. There is moderate thickening of the aortic valve. Aortic valve regurgitation is not visualized. Aortic valve sclerosis/calcification is present, without any evidence of aortic stenosis.  7. The inferior vena cava is dilated in size with >50% respiratory variability, suggesting right atrial pressure of 8 mmHg. Comparison(s): Prior images reviewed side by side. The left ventricular function is worsened. The right ventricular systolic function is significantly worse. The left ventricular wall motion abnormality is new. Known occluded mid LAD since 2012. FINDINGS  Left Ventricle: Left ventricular ejection fraction, by estimation, is 45 to 50%. The left ventricle has mildly decreased function. The left ventricle demonstrates regional wall motion abnormalities. Mild hypokinesis of the left ventricular, entire apical segment. Definity contrast agent was given IV to delineate the left ventricular endocardial borders. The left ventricular internal cavity size was normal in size. There is no left ventricular hypertrophy. Left ventricular diastolic function could not be evaluated due to atrial fibrillation. Left ventricular diastolic function could not be evaluated.  LV Wall Scoring: The entire apex is hypokinetic. Abnormal septal motion suggests increased work of breathing (e.g. COPD exacerbation). Right Ventricle: The right ventricular size is moderately  enlarged. No increase in right ventricular wall thickness. Right ventricular systolic function is moderately reduced. Tricuspid regurgitation signal is inadequate for assessing PA pressure. Left Atrium: Left atrial size was mildly dilated. Right Atrium: Right atrial size was mildly dilated. Pericardium: There is no evidence of pericardial effusion. Mitral Valve: The mitral valve is normal in structure. Mild mitral annular calcification. Mild to moderate mitral valve regurgitation, with eccentric laterally directed jet. No evidence of mitral valve stenosis. Tricuspid Valve: The tricuspid valve is grossly normal. Tricuspid valve regurgitation is not demonstrated. Aortic Valve: The aortic valve is tricuspid. There is mild calcification of the aortic valve. There is moderate thickening of the aortic valve. Aortic valve regurgitation is not visualized. Aortic valve sclerosis/calcification is present, without any evidence of aortic stenosis. Aortic valve mean gradient measures 2.7 mmHg. Aortic valve peak gradient measures 4.2 mmHg. Aortic valve area, by VTI measures 2.48 cm. Pulmonic Valve: The pulmonic valve was grossly normal. Pulmonic valve regurgitation is not visualized. Aorta: The aortic root and ascending aorta are structurally  normal, with no evidence of dilitation. Venous: The inferior vena cava is dilated in size with greater than 50% respiratory variability, suggesting right atrial pressure of 8 mmHg. IAS/Shunts: No atrial level shunt detected by color flow Doppler.  LEFT VENTRICLE PLAX 2D LVIDd:         4.80 cm LVIDs:         4.10 cm LV PW:         1.40 cm LV IVS:        1.50 cm LVOT diam:     1.90 cm LV SV:         35 LV SV Index:   16 LVOT Area:     2.84 cm  RIGHT VENTRICLE            IVC RV Basal diam:  3.10 cm    IVC diam: 2.00 cm RV S prime:     5.63 cm/s TAPSE (M-mode): 0.5 cm LEFT ATRIUM             Index        RIGHT ATRIUM           Index LA diam:        4.40 cm 2.05 cm/m   RA Area:     20.51 cm  LA Vol (A2C):   55.4 ml 25.84 ml/m  RA Volume:   37.70 ml  17.58 ml/m LA Vol (A4C):   67.9 ml 31.67 ml/m LA Biplane Vol: 66.2 ml 30.88 ml/m  AORTIC VALVE AV Area (Vmax):    2.06 cm AV Area (Vmean):   1.83 cm AV Area (VTI):     2.48 cm AV Vmax:           101.93 cm/s AV Vmean:          79.000 cm/s AV VTI:            0.142 m AV Peak Grad:      4.2 mmHg AV Mean Grad:      2.7 mmHg LVOT Vmax:         74.23 cm/s LVOT Vmean:        50.967 cm/s LVOT VTI:          0.124 m LVOT/AV VTI ratio: 0.87  AORTA Ao Root diam: 3.20 cm Ao Asc diam:  3.40 cm  SHUNTS Systemic VTI:  0.12 m Systemic Diam: 1.90 cm Dani Gobble Croitoru MD Electronically signed by Sanda Klein MD Signature Date/Time: 10/15/2022/12:43:15 PM    Final    CT Angio Chest PE W and/or Wo Contrast  Result Date: 10/02/2022 CLINICAL DATA:  76 year old male with chest and abdominal pain. End-stage renal disease on dialysis. EXAM: CT ANGIOGRAPHY CHEST CT ABDOMEN AND PELVIS WITH CONTRAST TECHNIQUE: Multidetector CT imaging of the chest was performed using the standard protocol during bolus administration of intravenous contrast. Multiplanar CT image reconstructions and MIPs were obtained to evaluate the vascular anatomy. Multidetector CT imaging of the abdomen and pelvis was performed using the standard protocol during bolus administration of intravenous contrast. RADIATION DOSE REDUCTION: This exam was performed according to the departmental dose-optimization program which includes automated exposure control, adjustment of the mA and/or kV according to patient size and/or use of iterative reconstruction technique. CONTRAST:  80m OMNIPAQUE IOHEXOL 350 MG/ML SOLN COMPARISON:  None Available. 08/31/2022 chest radiograph. FINDINGS: CTA CHEST FINDINGS Cardiovascular: This is a technically adequate study. There is non opacification of lobar and segmental arteries of the lingula and LEFT LOWER lobe and to a lesser extent the  RIGHT LOWER lobe. On the 1st scan, there is  decreased attenuation of contrast into the areas of non opacified arteries which could represent flow/pressure related non opacification. However, on repeat imaging with delayed triggering, there is a similar appearance with contrast noted in the pulmonary veins and LEFT atrium. On the CT of the abdomen study, there appears to be opacification of LOWER lobe pulmonary arteries. RV/LV ratio measures 1.0. UPPER limits normal heart size noted. Small pericardial effusion is noted. Coronary artery and aortic atherosclerotic calcifications are present. There is no evidence of thoracic aortic aneurysm. Mediastinum/Nodes: No enlarged mediastinal, hilar, or axillary lymph nodes. Thyroid gland, trachea, and esophagus demonstrate no significant findings. Lungs/Pleura: Moderate to large bilateral pleural effusions are noted with associated atelectasis, LEFT greater than RIGHT. LEFT basilar opacities/atelectasis noted. There is no evidence of pneumothorax. Musculoskeletal: No acute or suspicious bony abnormalities are noted. Diffuse subcutaneous edema noted. Review of the MIP images confirms the above findings. CT ABDOMEN and PELVIS FINDINGS Hepatobiliary: No definite liver or hepatic abnormality noted. There is no evidence of intrahepatic or extrahepatic biliary dilatation. Pancreas: Unremarkable Spleen: Unremarkable Adrenals/Urinary Tract: Bilateral renal atrophy noted. Renal cysts are noted and no imaging follow-up recommended. No significant adrenal or bladder abnormalities noted. Stomach/Bowel: Stomach is within normal limits. Appendix appears normal. No evidence of bowel wall thickening, distention, or inflammatory changes. Vascular/Lymphatic: Aortic atherosclerosis. No enlarged abdominal or pelvic lymph nodes. Reproductive: Prostate is unremarkable. Other: No ascites, focal collection or pneumoperitoneum. Subcutaneous edema is identified. Musculoskeletal: No acute or suspicious bony abnormalities are noted. Review of the  MIP images confirms the above findings. IMPRESSION: 1. Non opacification of lobar and segmental arteries of the lingula and LEFT LOWER lobe and to a lesser extent the RIGHT LOWER lobe, persisting on a repeat scan with delayed triggering - equivocal for pulmonary emboli. This appearance is more suggestive of flow/pressure related non opacification, and there appears to be opacification of the LEFT LOWER lobe pulmonary arteries on the CT abdomen study. RV/LV ratio measures 1 which would signify RIGHT heart strain if pulmonary emboli are present. 2. Moderate to large bilateral pleural effusions with associated atelectasis, LEFT greater than RIGHT. LEFT basilar opacities/atelectasis and pneumonia or aspiration is not excluded. 3. UPPER limits normal heart size and small pericardial effusion. 4. Diffuse subcutaneous edema. 5. No evidence of acute abnormality within the abdomen or pelvis. Critical Value/emergent results were called by telephone at the time of interpretation on 09/28/2022 at 5:44 pm to provider [facility] , who verbally acknowledged these results. Electronically Signed   By: Margarette Canada M.D.   On: 10/17/2022 17:44   CT ABDOMEN PELVIS W CONTRAST  Result Date: 09/24/2022 CLINICAL DATA:  76 year old male with chest and abdominal pain. End-stage renal disease on dialysis. EXAM: CT ANGIOGRAPHY CHEST CT ABDOMEN AND PELVIS WITH CONTRAST TECHNIQUE: Multidetector CT imaging of the chest was performed using the standard protocol during bolus administration of intravenous contrast. Multiplanar CT image reconstructions and MIPs were obtained to evaluate the vascular anatomy. Multidetector CT imaging of the abdomen and pelvis was performed using the standard protocol during bolus administration of intravenous contrast. RADIATION DOSE REDUCTION: This exam was performed according to the departmental dose-optimization program which includes automated exposure control, adjustment of the mA and/or kV according to  patient size and/or use of iterative reconstruction technique. CONTRAST:  8m OMNIPAQUE IOHEXOL 350 MG/ML SOLN COMPARISON:  None Available. 08/31/2022 chest radiograph. FINDINGS: CTA CHEST FINDINGS Cardiovascular: This is a technically adequate study. There is non opacification  of lobar and segmental arteries of the lingula and LEFT LOWER lobe and to a lesser extent the RIGHT LOWER lobe. On the 1st scan, there is decreased attenuation of contrast into the areas of non opacified arteries which could represent flow/pressure related non opacification. However, on repeat imaging with delayed triggering, there is a similar appearance with contrast noted in the pulmonary veins and LEFT atrium. On the CT of the abdomen study, there appears to be opacification of LOWER lobe pulmonary arteries. RV/LV ratio measures 1.0. UPPER limits normal heart size noted. Small pericardial effusion is noted. Coronary artery and aortic atherosclerotic calcifications are present. There is no evidence of thoracic aortic aneurysm. Mediastinum/Nodes: No enlarged mediastinal, hilar, or axillary lymph nodes. Thyroid gland, trachea, and esophagus demonstrate no significant findings. Lungs/Pleura: Moderate to large bilateral pleural effusions are noted with associated atelectasis, LEFT greater than RIGHT. LEFT basilar opacities/atelectasis noted. There is no evidence of pneumothorax. Musculoskeletal: No acute or suspicious bony abnormalities are noted. Diffuse subcutaneous edema noted. Review of the MIP images confirms the above findings. CT ABDOMEN and PELVIS FINDINGS Hepatobiliary: No definite liver or hepatic abnormality noted. There is no evidence of intrahepatic or extrahepatic biliary dilatation. Pancreas: Unremarkable Spleen: Unremarkable Adrenals/Urinary Tract: Bilateral renal atrophy noted. Renal cysts are noted and no imaging follow-up recommended. No significant adrenal or bladder abnormalities noted. Stomach/Bowel: Stomach is within  normal limits. Appendix appears normal. No evidence of bowel wall thickening, distention, or inflammatory changes. Vascular/Lymphatic: Aortic atherosclerosis. No enlarged abdominal or pelvic lymph nodes. Reproductive: Prostate is unremarkable. Other: No ascites, focal collection or pneumoperitoneum. Subcutaneous edema is identified. Musculoskeletal: No acute or suspicious bony abnormalities are noted. Review of the MIP images confirms the above findings. IMPRESSION: 1. Non opacification of lobar and segmental arteries of the lingula and LEFT LOWER lobe and to a lesser extent the RIGHT LOWER lobe, persisting on a repeat scan with delayed triggering - equivocal for pulmonary emboli. This appearance is more suggestive of flow/pressure related non opacification, and there appears to be opacification of the LEFT LOWER lobe pulmonary arteries on the CT abdomen study. RV/LV ratio measures 1 which would signify RIGHT heart strain if pulmonary emboli are present. 2. Moderate to large bilateral pleural effusions with associated atelectasis, LEFT greater than RIGHT. LEFT basilar opacities/atelectasis and pneumonia or aspiration is not excluded. 3. UPPER limits normal heart size and small pericardial effusion. 4. Diffuse subcutaneous edema. 5. No evidence of acute abnormality within the abdomen or pelvis. Critical Value/emergent results were called by telephone at the time of interpretation on 10/09/2022 at 5:44 pm to provider [facility] , who verbally acknowledged these results. Electronically Signed   By: Margarette Canada M.D.   On: 09/30/2022 17:44    Assessment: 76 y.o. male with PMH of ESRD on HD, hypotension on midodrine prior to HD, HLD, obesity, DM, afib on eliquis, esophageal stricture, gastric ulcer and recent admission for osteomyelitis involving the left hand and required surgical intervention including extensive debridement, and amputation of the left thumb. He was admitted 11/25 for possible septic shock and put  on Abx and pressor. Also found to have b/l PE and large pleural effusion. His last eliquis dose was 11/25 am at home and heparin IV was stopped today 1330 in preparation for thoracentesis. Heparin level this am was high > 1.10 and APTT > 200. Na this am 132 but on repeat in pm it was 128.   Code stroke activated for acute change of mental status, less responsive, able to open  eyes on voice but not answer questions, not following commands. LSW 2:30pm. However, on exam, no obvious focal deficit, but NIHSS = 27. CT head no acute finding and CTA head and neck no LVO. Pt not TNK candidate due to last eliquis dose within 48 hours, heparin IV only stopped 2-3 hours ago, last APTT high and low suspicion for stroke at this time. Etiology more likely encephalopathy, but he dose have multiple risk for stroke such as afib, b/l PE and hypotension. will do MRI brain and EEG (given Na 128). Recommend to avoid low BP. OK to continue heparin IV at this time.   Stroke Risk Factors - atrial fibrillation, diabetes mellitus, hyperlipidemia, and b/l PE and profound hypotension  Plan: Continue further neuro work up  Frequent neuro checks Telemetry monitoring PT/INR, APTT and CBC pending MRI brain  Stat EEG Echocardiogram  UDS, fasting lipid panel and HgbA1C PT/OT/speech consult BP gaol < 100-180, avoid low BP, continue pressor OK to continue heparin IV GI and DVT prophylaxis  Stroke risk factor modification Discussed with Dr. Tacy Learn CCM Will follow    Thank you for this consultation and allowing [place] to participate in the care of this patient.  Rosalin Hawking, MD PhD Stroke Neurology 10/15/2022 4:37 PM  This patient is critically ill due to stroke like symptoms, ESRD, b/l PE, permeant afib, septic shock and at significant risk of neurological worsening, death form stroke, kidney failure, heart failure, severe sepsis. This patient's care requires constant monitoring of vital signs, hemodynamics, respiratory and  cardiac monitoring, review of multiple databases, neurological assessment, discussion with family, other specialists and medical decision making of high complexity. I spent 60 minutes of neurocritical care time in the care of this patient.

## 2022-10-15 NOTE — Progress Notes (Signed)
  Transition of Care [facility]) Screening Note   Patient Details  Name: Louis [name] Date of Birth: October 25, 1946   Transition of Care [facility]) CM/SW Contact:    Dawayne Patricia, RN Phone Number: 10/15/2022, 11:24 AM    Transition of Care Department [facility]) has reviewed patient and note that pt currently on low-dose vasopressor support  From home, ESRD- HD TTS.. We will continue to monitor patient advancement through interdisciplinary progression rounds. If new patient transition needs arise, please place a TOC consult.

## 2022-10-15 NOTE — Code Documentation (Addendum)
Stroke Response Nurse Documentation Code Documentation  Louis [name] is a 76 y.o. male admitted to [facility]  on 10/19/2022 for sepsis with past medical hx of ESRD on hemodialysis, PVD, recent osteomyelitis, type 2 diabetes, atrial fibrillation. On heparin IV. Code stroke was activated by Dr. Tacy Learn and primary RN Threasa Beards.   Patient on 2H where he was LKW at 1430 and now presenting with altered mental status. RN came into patient room and noted his overall demeanor to be less responsive than earlier today, pt to have minimal speech and not following commands. Primary MD was notified and a code stroke was activated.   Stroke team at the bedside after patient activation. Patient to CT with team.   NIHSS 27, see documentation for details and code stroke times. Patient with decreased LOC, disoriented, not following commands, right gaze preference, bilateral hemianopia, bilateral arm weakness, bilateral leg weakness, bilateral decreased sensation, global aphasia, dysarthria, and neglect on exam.  The following imaging was completed:  CT Head and CTA.   Patient is not a candidate for IV Thrombolytic due to low suspicion for stroke. Patient is not a candidate for IR due to CTA negative for LVO, per Dr. Erlinda Hong.   Care/Plan: q2h NIHSS, q1h VS. EEG, MRI.   Bedside handoff with RN Threasa Beards.    Arlisha Patalano L Pedrohenrique [place]  Rapid Response RN

## 2022-10-15 NOTE — Consult Note (Signed)
ESRD Consult Note  Requesting provider: Jacky [name] Service requesting consult: CCM Reason for consult: ESRD, provision of dialysis Indication for acute dialysis?: End Stage Renal Disease  Outpatient dialysis unit: [facility] Outpatient dialysis prescription: F180, BFR 450, EDW 101kg, 3K, 2.5K, 4hr, Mircer 165mg q2 weeks last givn on 11/16, hectorol 634m  Assessment/Recommendations: Louis [name] a/an [age].o. male with a past medical history notable for ESRD on HD admitted with septic shock.   # ESRD: No urgent needs for HD. Plan for HD on Tuesday unless urgent needs arise. Hopefully will be off pressors and can do HD  # Volume/ hypertension: EDW 101kg. Attempt to achieve EDW as tolerated. Slightly volume overloaded today  # Anemia of Chronic Kidney Disease: Hemoglobin 9. CTM. ESA due on 11/30  # Secondary Hyperparathyroidism/Hyperphosphatemia: Ca corrects to normal. Phos normal. Hold binders. Continue home hectorol   # Vascular access: TDC with no issues  # Septic Shock/PNA: antibiotics and pressors per primary team   # Additional recommendations: - Dose all meds for creatinine clearance < 10 ml/min  - Unless absolutely necessary, no MRIs with gadolinium.  - Implement save arm precautions.  Prefer needle sticks in the dorsum of the hands or wrists.  No blood pressure measurements in arm. - If blood transfusion is requested during hemodialysis sessions, please alert [place] to the session.  - Use synthetic opioids (Fentanyl/Dilaudid) if needed  Recommendations were discussed with the primary team.   History of Present Illness: Louis [name] a/an [age].o. male with a past medical history of ESRD who presents with hypotension.  Patient states that when he left rehabd his strength had improved and he was able to transfer on his own.  Then a couple days ago he started feeling extreme weakness and was unable to transfer himself on his own.  Did notice some edema.  Some  shortness of breath but largely unchanged.  No fevers that he was aware of.  Appetite had been poor.  Went to dialysis yesterday and was noted to have significant hypotension but did complete his treatment.  In the emergency department he was noted to have systolic blood pressures in the 80s.  He has been on midodrine in the outpatient setting prior to dialysis but this was worse than usual.  In the emergency department workup was concerning for pneumonia.  He was started on norepinephrine and admitted to the ICU.  His family wanted to not undergo central line placement so his dialysis catheter was used for norepinephrine.  He required small amounts of this medication overnight.  This morning feels about the same.  Continues to have some weakness and poor appetite.  Of note, he has had recent hospitalizations regarding osteomyelitis of the left hand and required ligation of his AV fistula on that side.  Has required surgical debridement and was discharged in late October.   Medications:  Current Facility-Administered Medications  Medication Dose Route Frequency Provider Last Rate Last Admin   alteplase (CATHFLO ACTIVASE) injection 2 mg  2 mg Intracatheter Once PRN Louis [name], Louis CommentPA       anticoagulant sodium citrate solution 5 mL  5 mL Intracatheter PRN Louis [name], Louis CommentPA       ceFEPIme (MAXIPIME) 1 g in sodium chloride 0.9 % 100 mL IVPB  1 g Intravenous Q24H Louis [name]   Stopped at 09/25/2022 1636   Chlorhexidine Gluconate Cloth 2 % PADS 6 each  6 each Topical Daily MaMarshell GarfinkelMD  6 each at 09/27/2022 2030   cholecalciferol (VITAMIN D3) 25 MCG (1000 UNIT) tablet 2,000 Units  2,000 Units Oral Daily Louis [name]   2,000 Units at 09/27/2022 1745   docusate sodium ([name]) capsule 100 mg  100 mg Oral BID PRN Louis [name]       heparin ADULT infusion 100 units/mL (25000 units/271m)  1,500 Units/hr Intravenous Continuous Louis [name] 15 mL/hr at 10/15/22  0500 1,500 Units/hr at 10/15/22 0500   heparin injection 1,000 Units  1,000 Units Intracatheter PRN Louis [name]       lactulose (CHRONULAC) 10 GM/15ML solution 20 g  20 g Oral BID PRN Louis [name]   20 g at 10/15/22 0024   lidocaine (PF) (XYLOCAINE) 1 % injection 5 mL  5 mL Intradermal PRN Louis [name]       lidocaine-prilocaine (EMLA) cream 1 Application  1 Application Topical PRN Louis [name]       midodrine (PROAMATINE) tablet 10 mg  10 mg Oral Q8H Louis [name]       ondansetron (ZOFRAN) injection 4 mg  4 mg Intravenous Once BErick Colace [name]       pantoprazole (PROTONIX) EC tablet 40 mg  40 mg Oral Daily BErick Colace [name]   40 mg at 10/10/2022 1745   pentafluoroprop-tetrafluoroeth (GEBAUERS) aerosol 1 Application  1 Application Topical PRN Louis [name]       polyethylene glycol (MIRALAX / GLYCOLAX) packet 17 g  17 g Oral Daily Louis [name]       rosuvastatin (CRESTOR) tablet 40 mg  40 mg Oral Daily BErick Colace [name]   40 mg at 10/18/2022 1746   senna (SENOKOT) tablet 17.2 mg  2 tablet Oral Daily Louis [name]       traMADol (Veatrice Bourbon tablet 50 mg  50 mg Oral Q12H PRN Louis [name]   50 mg at 09/27/2022 2352     ALLERGIES Hydrocodone, Oxycodone, Vancomycin, Dacarbazine, Latex, and Tape  MEDICAL HISTORY Past Medical History:  Diagnosis Date   Arthritis    Brittle bones    per pt, has soft bones in right foot/wears boot cast!   CAD (coronary artery disease)    Cancer ([facility]    skin cancer on arm   Cataract    Bil/ surg scheduled for right eye 01/18/17/ left eye 02/08/17   Charcot ankle, right 2019   CHF (congestive heart failure) ([place] 2015   Clostridium difficile colitis 10/09/2022   Diabetes mellitus    Type 2   ESRD (end stage renal disease) on dialysis ([facility]    [facility]   Heart failure, diastolic (HCC)    History of kidney stones    Hyperlipidemia    Hypertension    Macular degeneration  disease    Macular edema 2014   OSA on CPAP    Paroxysmal atrial fibrillation (HCC)    Personal history of colonic polyps - adenomas 01/28/2014   Shortness of breath dyspnea    Syncope and collapse      SOCIAL HISTORY Social History   Socioeconomic History   Marital status: Married    Spouse name: GBaker Janus  Number of children: 5   Years of education: 12   Highest education level: 12th grade  Occupational History   Occupation: Warehouse    [name]: retired  Tobacco Use   Smoking status: Former    Packs/day: 0.50  Years: 20.00    Total pack years: 10.00    Types: Cigarettes    Quit date: 03/06/1977    Years since quitting: 45.6   Smokeless tobacco: Never  Vaping Use   Vaping Use: Never used  Substance and Sexual Activity   Alcohol use: No   Drug use: No   Sexual activity: Not Currently  Other Topics Concern   Not on file  Social History Narrative   Lives with his wife. She is his caretaker. They have 18 grandchildren. He enjoys watching sports as he is not able to do much due to neuropathy.   Social Determinants of Health   Financial Resource Strain: Low Risk  (03/27/2022)   Overall Financial Resource Strain (CARDIA)    Difficulty of Paying Living Expenses: Not hard at all  Food Insecurity: No Food Insecurity (08/29/2022)   Hunger Vital Sign    Worried About Running Out of Food in the Last Year: Never true    Ran Out of Food in the Last Year: Never true  Transportation Needs: No Transportation Needs (08/29/2022)   PRAPARE - Hydrologist (Medical): No    Lack of Transportation (Non-Medical): No  Physical Activity: Inactive (03/27/2022)   Exercise Vital Sign    Days of Exercise per Week: 0 days    Minutes of Exercise per Session: 0 min  Stress: No Stress Concern Present (03/27/2022)   [place]    Feeling of Stress : Not at all  Social Connections: Moderately Integrated  (03/27/2022)   Social Connection and Isolation Panel [NHANES]    Frequency of Communication with Friends and Family: Twice a week    Frequency of Social Gatherings with Friends and Family: Once a week    Attends Religious Services: More than 4 times per year    Active Member of [employer] or Organizations: No    Attends Archivist Meetings: Never    Marital Status: Married  Human resources officer Violence: Not At Risk (08/29/2022)   Humiliation, Afraid, Rape, and Kick questionnaire    Fear of Current or Ex-Partner: No    Emotionally Abused: No    Physically Abused: No    Sexually Abused: No     FAMILY HISTORY Family History  Problem Relation Age of Onset   Diabetes Mother    Kidney disease Mother    Diabetes Father    Coronary artery disease Father    Diabetes Brother    Diabetes Sister    Prostate cancer Maternal Uncle    Diabetes Maternal Grandmother    Cancer Paternal Grandmother        unknown   Heart attack Paternal Grandfather    Colon cancer Neg Hx      Review of Systems: 12 systems were reviewed and negative except per HPI  Physical Exam: Vitals:   10/15/22 0700 10/15/22 0715  BP: (!) 88/68 92/62  Pulse: (!) 102 92  Resp: 20 (!) 23  Temp:    SpO2: 94% 95%   No intake/output data recorded.  Intake/Output Summary (Last 24 hours) at 10/15/2022 0816 Last data filed at 10/15/2022 0500 Gross per 24 hour  Intake 275.11 ml  Output 0 ml  Net 275.11 ml   General: Ill-appearing, lying in bed, no distress HEENT: anicteric sclera, MMM CV: normal rate, no murmurs, trace-1+ edema in all 4 extremeties Lungs: bilateral chest rise, normal wob Abd: soft, non-tender, non-distended Skin: no visible lesions or rashes Psych:  alert, engaged, appropriate mood and affect Neuro: normal speech, no gross focal deficits   Test Results Reviewed Lab Results  Component Value Date   NA 132 (L) 10/15/2022   K 4.5 10/15/2022   CL 93 (L) 10/15/2022   CO2 22 10/15/2022   BUN 20  10/15/2022   CREATININE 4.18 (H) 10/15/2022   GFR 38.90 (L) 03/10/2011   GLU 145 03/12/2022   CALCIUM 8.6 (L) 10/15/2022   ALBUMIN 2.0 (L) 09/27/2022   PHOS 3.9 10/19/2022    I have reviewed relevant outside healthcare records

## 2022-10-15 NOTE — Progress Notes (Signed)
Pharmacy Antibiotic Note  Louis [name] is a 76 y.o. male admitted on 10/07/2022 with sepsis.  Pharmacy has been consulted for cefepime dosing. Patient is ESRD on iHD. 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  WBC up to 16. LA now >9 - has increasing pressor requirements. Code stroke was called but CT head showing no intracranial abnormalities or LVO. Talked with CCM - would like to change from cefepime to zosyn given possible side effect of neurotoxicity. Will also order one time dose of linezolid - did have 1/4 bottles grow MRSE which was likely contaminant but given clinical pic will order one dose to cover for now and await further work up.    Plan: Zosyn 2.25g IV every 8 hours Linezolid IV 600 mg once  Will monitor cx reuslts, clinical pic, and RRT plans  Height: '5\' 7"'$  (170.2 cm) Weight: 104.1 kg (229 lb 8 oz) IBW/kg (Calculated) : 66.1  Temp (24hrs), Avg:98.6 F (37 C), Min:97.7 F (36.5 C), Max:100.2 F (37.9 C)  Recent Labs  Lab 10/04/2022 1345 09/23/2022 1401 10/17/2022 1801 09/24/2022 2140 09/29/2022 2200 10/15/22 0543 10/15/22 1555  WBC 9.0  --   --   --  8.8 8.2 16.1*  CREATININE 3.24*  --   --   --  3.80* 4.18*  --   LATICACIDVEN  --  2.4* 2.6* 2.7*  --   --  >9.0*     Estimated Creatinine Clearance: 17.3 mL/min (A) (by C-G formula based on SCr of 4.18 mg/dL (H)).    Allergies  Allergen Reactions   Hydrocodone Nausea And Vomiting    Other reaction(s): GI Upset (intolerance) Projectile vomiting    Oxycodone Nausea And Vomiting    Other reaction(s): GI Upset (intolerance), Vomiting (intolerance) Projectile vomiting    Vancomycin Anaphylaxis    ORAL VANCOMYCIN for C diff.   Dacarbazine Other (See Comments)    Unknown reaction   Latex Itching and Rash   Tape Dermatitis, Itching and Rash    Patch used at dialysis    Antimicrobials this  admission: Cefepime 11/25> 11/26 Flagyl 11/25>11/26 Zosyn 11/26 >> Linezolid 11/25, 11/26  Dose adjustments this admission: N/A  Microbiology results: 11/25 Bcx: 1/4 MRSE 11/25 Resp Panel: neg 11/25 MRSA PCR: neg  Thank you for allowing pharmacy to be a part of this patient's care.  Antonietta Jewel, PharmD, [place] Clinical Pharmacist  Phone: [phone] 10/15/2022 5:19 PM  Please check AMION for all [place] phone numbers After 10:00 PM, call [place] [phone]

## 2022-10-15 NOTE — IPAL (Signed)
  Interdisciplinary Goals of Care Family Meeting   Date carried out: 10/15/2022  Location of the meeting: Bedside  Member's involved: Physician, Bedside Registered Nurse, and Family Member or next of kin  Durable Power of Attorney or acting medical decision maker: Louis [name]  Discussion: We discussed goals of care for Louis [name] .   We discussed that he had sudden change in mental status and overall condition considering he is requiring more blood pressure medication support, his change in condition could be related to worsening septic shock versus seizure, considering he is at high risk of going into multiorgan failure  Patient's wife is stated that we would like to continue current treatment and if needed short course of mechanical ventilation. She would not want him to have CPR or defibrillation but medications are okay CODE STATUS was updated with partial   Code status: Limited Code or DNR with short term  Disposition: Continue current acute care    Louis Kindle, MD  10/15/2022, 5:03 PM

## 2022-10-16 ENCOUNTER — Inpatient Hospital Stay (HOSPITAL_COMMUNITY): Payer: Medicare Other

## 2022-10-16 ENCOUNTER — Ambulatory Visit: Payer: Medicare Other | Admitting: Cardiology

## 2022-10-16 DIAGNOSIS — A419 Sepsis, unspecified organism: Secondary | ICD-10-CM | POA: Diagnosis not present

## 2022-10-16 DIAGNOSIS — J90 Pleural effusion, not elsewhere classified: Secondary | ICD-10-CM

## 2022-10-16 DIAGNOSIS — E8770 Fluid overload, unspecified: Secondary | ICD-10-CM

## 2022-10-16 DIAGNOSIS — E8779 Other fluid overload: Secondary | ICD-10-CM

## 2022-10-16 DIAGNOSIS — N186 End stage renal disease: Secondary | ICD-10-CM

## 2022-10-16 LAB — CBC WITH DIFFERENTIAL/PLATELET
Abs Immature Granulocytes: 0.04 10*3/uL (ref 0.00–0.07)
Basophils Absolute: 0 10*3/uL (ref 0.0–0.1)
Basophils Relative: 0 %
Eosinophils Absolute: 0 10*3/uL (ref 0.0–0.5)
Eosinophils Relative: 0 %
HCT: 35.7 % — ABNORMAL LOW (ref 39.0–52.0)
Hemoglobin: 10.5 g/dL — ABNORMAL LOW (ref 13.0–17.0)
Immature Granulocytes: 0 %
Lymphocytes Relative: 4 %
Lymphs Abs: 0.3 10*3/uL — ABNORMAL LOW (ref 0.7–4.0)
MCH: 31.6 pg (ref 26.0–34.0)
MCHC: 29.4 g/dL — ABNORMAL LOW (ref 30.0–36.0)
MCV: 107.5 fL — ABNORMAL HIGH (ref 80.0–100.0)
Monocytes Absolute: 0.7 10*3/uL (ref 0.1–1.0)
Monocytes Relative: 7 %
Neutro Abs: 8.5 10*3/uL — ABNORMAL HIGH (ref 1.7–7.7)
Neutrophils Relative %: 89 %
Platelets: 169 10*3/uL (ref 150–400)
RBC: 3.32 MIL/uL — ABNORMAL LOW (ref 4.22–5.81)
RDW: 24.6 % — ABNORMAL HIGH (ref 11.5–15.5)
Smear Review: NORMAL
WBC: 9.6 10*3/uL (ref 4.0–10.5)
nRBC: 0.9 % — ABNORMAL HIGH (ref 0.0–0.2)

## 2022-10-16 LAB — RENAL FUNCTION PANEL
Albumin: 1.7 g/dL — ABNORMAL LOW (ref 3.5–5.0)
Anion gap: 19 — ABNORMAL HIGH (ref 5–15)
BUN: 28 mg/dL — ABNORMAL HIGH (ref 8–23)
CO2: 21 mmol/L — ABNORMAL LOW (ref 22–32)
Calcium: 8.8 mg/dL — ABNORMAL LOW (ref 8.9–10.3)
Chloride: 91 mmol/L — ABNORMAL LOW (ref 98–111)
Creatinine, Ser: 5.01 mg/dL — ABNORMAL HIGH (ref 0.61–1.24)
GFR, Estimated: 11 mL/min — ABNORMAL LOW (ref >60)
Glucose, Bld: 156 mg/dL — ABNORMAL HIGH (ref 70–99)
Phosphorus: 5.9 mg/dL — ABNORMAL HIGH (ref 2.5–4.6)
Potassium: 5.1 mmol/L (ref 3.5–5.1)
Sodium: 131 mmol/L — ABNORMAL LOW (ref 135–145)

## 2022-10-16 LAB — LACTIC ACID, PLASMA: Lactic Acid, Venous: 7.1 mmol/L (ref 0.5–1.9)

## 2022-10-16 LAB — HEMOGLOBIN A1C
Hgb A1c MFr Bld: 6.1 % — ABNORMAL HIGH (ref 4.8–5.6)
Mean Plasma Glucose: 128 mg/dL

## 2022-10-16 LAB — GLUCOSE, CAPILLARY
Glucose-Capillary: 131 mg/dL — ABNORMAL HIGH (ref 70–99)
Glucose-Capillary: 106 mg/dL — ABNORMAL HIGH (ref 70–99)

## 2022-10-16 MED ORDER — GLYCOPYRROLATE 0.2 MG/ML IJ SOLN
0.2000 mg | INTRAMUSCULAR | Status: DC | PRN
  Administered 2022-10-16: 0.2 mg via INTRAVENOUS
  Filled 2022-10-16: qty 1

## 2022-10-16 MED ORDER — ALBUMIN HUMAN 25 % IV SOLN
25.0000 g | Freq: Once | INTRAVENOUS | Status: AC
  Administered 2022-10-16: 25 g via INTRAVENOUS
  Filled 2022-10-16: qty 100

## 2022-10-16 MED ORDER — FENTANYL CITRATE PF 50 MCG/ML IJ SOSY
12.5000 ug | PREFILLED_SYRINGE | Freq: Once | INTRAMUSCULAR | Status: AC
  Administered 2022-10-16: 12.5 ug via INTRAVENOUS
  Filled 2022-10-16: qty 1

## 2022-10-16 MED ORDER — ACETAMINOPHEN 650 MG RE SUPP: 650.0000 mg | Freq: Four times a day (QID) | RECTAL | Status: DC | PRN

## 2022-10-16 MED ORDER — GLYCOPYRROLATE 1 MG PO TABS: 1.0000 mg | ORAL_TABLET | ORAL | Status: DC | PRN

## 2022-10-16 MED ORDER — BIOTENE DRY MOUTH MT LIQD: 15.0000 mL | OROMUCOSAL | Status: DC | PRN

## 2022-10-16 MED ORDER — MORPHINE SULFATE (PF) 2 MG/ML IV SOLN
1.0000 mg | INTRAVENOUS | Status: DC | PRN
  Administered 2022-10-16 (×2): 1 mg via INTRAVENOUS
  Filled 2022-10-16 (×2): qty 1

## 2022-10-16 MED ORDER — ONDANSETRON HCL 4 MG/2ML IJ SOLN: 4.0000 mg | Freq: Four times a day (QID) | INTRAMUSCULAR | Status: DC | PRN

## 2022-10-16 MED ORDER — FENTANYL CITRATE PF 50 MCG/ML IJ SOSY
25.0000 ug | PREFILLED_SYRINGE | INTRAMUSCULAR | Status: DC | PRN
  Administered 2022-10-16 (×2): 50 ug via INTRAVENOUS
  Filled 2022-10-16 (×2): qty 1

## 2022-10-16 MED ORDER — LORAZEPAM 2 MG/ML PO CONC: 1.0000 mg | ORAL | Status: DC | PRN

## 2022-10-16 MED ORDER — POLYVINYL ALCOHOL 1.4 % OP SOLN: 1.0000 [drp] | Freq: Four times a day (QID) | OPHTHALMIC | Status: DC | PRN

## 2022-10-16 MED ORDER — GLYCOPYRROLATE 0.2 MG/ML IJ SOLN: 0.2000 mg | INTRAMUSCULAR | Status: DC | PRN

## 2022-10-16 MED ORDER — LORAZEPAM 1 MG PO TABS: 1.0000 mg | ORAL_TABLET | ORAL | Status: DC | PRN

## 2022-10-16 MED ORDER — ONDANSETRON 4 MG PO TBDP: 4.0000 mg | ORAL_TABLET | Freq: Four times a day (QID) | ORAL | Status: DC | PRN

## 2022-10-16 MED ORDER — ACETAMINOPHEN 325 MG PO TABS: 650.0000 mg | ORAL_TABLET | Freq: Four times a day (QID) | ORAL | Status: DC | PRN

## 2022-10-16 MED ORDER — LORAZEPAM 2 MG/ML IJ SOLN
1.0000 mg | INTRAMUSCULAR | Status: DC | PRN
  Administered 2022-10-16: 1 mg via INTRAVENOUS
  Filled 2022-10-16: qty 1

## 2022-10-17 LAB — CULTURE, BLOOD (ROUTINE X 2)

## 2022-10-19 LAB — CULTURE, BLOOD (ROUTINE X 2)
Culture: NO GROWTH
Special Requests: ADEQUATE

## 2022-10-20 NOTE — Progress Notes (Signed)
   2022/11/07 1246  Clinical Encounter Type  Visited With Patient and family together;Health care provider  Visit Type Spiritual support;Patient actively dying;Initial  Referral From Nurse [facility] M. Enid Derry, RN)  Consult/Referral To Chaplain Melvenia Beam)  Recommendations Paged for [facility] Support EOL  Spiritual Encounters  Spiritual Needs Emotional;Grief support;Prayer   Chaplain paged to provide anticipatory grief support and prayer to family of Mr. YOVAN LEEMAN "Eduard Clos" as he nears end-of-life. Chaplain provided meaningful presence and reflective listening as family talked about his [place] and his loving family that have all been able to visit during the past few days. Family is providing reassuring comfort to one another as they share in their anticipatory grief.  Chaplain provided anointing of the sick and prayer consistent with the families [place] tradition. [address] [place], [place] [phone]

## 2022-10-20 NOTE — Consult Note (Signed)
WOC Nurse Consult Note: Patient receiving care in [place]. Reason for Consult:"Patient seen by RN biting at amputated thumb and educated on why not to do so. Wife stated that patient picks at fingers as well." Wound type: NO wounds on heels, only purple/blanchable discoloration. He also has purple/cyanotic discoloration to all of his toes. He has on heel foam dressings and nursing is using Prevalon heel lift boots.  This is all that is needed at this time. 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 There is a sacral foam dressing in place. This may also represent a stage 2 PI to the area in addition to the MASD. He has scabs on many places on his hands and the stump of the partially amputated left thumb. Pressure Injury POA: Yes Measurement: Wound bed: Drainage (amount, consistency, odor) none Periwound: intact Dressing procedure/placement/frequency: Aquacel advantage and foam dressing to the coccyx wound. Iodine to the scabbed areas of the hands. Continued use of the foam dressings and heel lift boots.  Monitor the wound area(s) for worsening of condition such as: Signs/symptoms of infection,  Increase in size,  Development of or worsening of odor, Development of pain, or increased pain at the affected locations.  Notify the medical team if any of these develop.  Thank you for the consult.  Discussed plan of care with the patient and bedside nurse.  [place] nurse will not follow at this time.  Please re-consult the [place] team if needed.  Val Riles, RN, MSN, CWOCN, CNS-BC, pager [phone]

## 2022-10-20 NOTE — Progress Notes (Addendum)
NAME:  Louis [name], MRN:  [id number], DOB:  June 10, 1946, LOS: 2 ADMISSION DATE:  09/23/2022, CONSULTATION DATE:  11/25 REFERRING MD:  Kathrynn Humble, CHIEF COMPLAINT:  hypotension   History of Present Illness:  76 year old male patient with history of end-stage renal disease receives dialysis Tuesday Thursday Saturday, recently hospitalized for osteomyelitis involving the left hand, this was following recent left upper arm AV fistula ligation.  Ultimately required surgical intervention including extensive debridement, and amputation of the left thumb.  He was ultimately discharged on 10/25, with plans to complete a total of 6 weeks of intravenous Fortaz and daptomycin which he has since done.  He has been followed in the orthopedic clinic, his sutures have since been removed, follow-up exams surgical wise have been encouraging without evidence of infection. He presented to the emergency room on 11/25 with family reporting approximately 2 to 2-1/2-day history of progressive weakness, increased fatigue, and poor p.o. intake.  He has been weak since his hospital/rehab discharge, however at baseline had gotten to a point where he could stand and pivot to a chair with a two-person assist.  His weakness had gotten so bad on day of admission he could not not stand with full assist of 2 people.  He was able to eventually get to dialysis on 11/25 at which time he was noted to be fairly hypotensive during the case with systolic blood pressure in the 80s.  He has been on midodrine since the early part of October, instructed to take prior to dialysis.  Because of his weakness, and hypotension, he presented to the emergency room.  Pertinent  Medical History  End-stage renal disease TTS, peripheral vascular disease, recent osteomyelitis requiring amputation of left thumb, hyperlipidemia, obesity with BMI 30-39.9, hypotension, requiring midodrine prior to dialysis, type 2 diabetes atrial fibrillation on DOAC, GERD,  esophageal stricture with esophageal dysphagia Chronic gastric ulcer  Significant Hospital Events: Including procedures, antibiotic start and stop dates in addition to other pertinent events   11/25 admitted with hypotension and weakness. CTA chest negative for PE. 11/26 change in mental status, code stroke called. CT/CTA head negative. EEG negative for epileptiform activity. MRI brain negative. 11/26 Echo EF 54-62%, RV systolic function moderately reduced  Interim History / Subjective:  Stroke workup negative 11/26. Awake, alert this AM. No deficits but is complaining of epigastrium and mid back pain, per RN, constant throughout the night.  1/4 BC with MRSE, presumed contaminant  Objective   Blood pressure (!) 96/59, pulse 84, temperature 97.6 F (36.4 C), temperature source Oral, resp. rate (!) 29, height '5\' 7"'$  (1.702 m), weight 103.1 kg, SpO2 95 %.        Intake/Output Summary (Last 24 hours) at 11-10-22 0741 Last data filed at 2022-11-10 0700 Gross per 24 hour  Intake 1061.55 ml  Output 0 ml  Net 1061.55 ml    Filed Weights   10/15/22 0500 2022/11/10 0500  Weight: 104.1 kg 103.1 kg    Examination: General: Adult male, resting in bed, in NAD. Neuro: A&O x 3, no deficits. HEENT: [place]/AT. Sclerae anicteric. EOMI. Cardiovascular: IRIR, no M/R/G.  Lungs: Respirations even and unlabored.  Diminished bilaterally L > R. Abdomen: Obese. BS x 4, soft, NT/ND.  Musculoskeletal: No gross deformities, 3+ edema.  Skin: Intact, warm, no rashes.   Assessment & Plan:   Septic shock likely related to aspiration pneumonia.  - Continue Zosyn (changed from Cefepime 11/26 due to AMS and risk of neurotoxicity). - Continue Levophed as needed for goal  MAP > 65. - Continue Midodrine. - Albumin x 2 doses today.  End-stage renal disease on hemodialysis. Hyponatremia - presumed volume overload. - HD per nephro.  Dark stools - Patient wife stated that he took Pepto-Bismol for a few days  PTA. Hgb stable. 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 Fuller Plan on 10/17. - Continue with PPI. - Speech and swallow evaluation.  ? Bilateral pulmonary emboli - CTA chest 11/25 equivocal for bilateral PE but non-opacification of pulmonary arteries felt more likely to be flow/pressure related. Echo with moderately reduced systolic function. Of note, pt chronically on DOAC but did hold for several days 2/2 dark stools. - Continue heparin infusion after thoracentesis, unsure why not on MAR currently. (also has A.fib). - Resume Eliquis prior to discharge.   Bilateral moderate to large pleural effusions L > R. - Will assess pleural space under [place] today and plan for thoracentesis with usual studies.  Permanent atrial fibrillation. - Continue heparin infusion after thoracentesis, unsure why not on MAR currently. - Resume Eliquis prior to discharge.  Diabetes type 2. - Continue SSI.  Best Practice (right click and "Reselect all SmartList Selections" daily)   Diet/type: Swallow evaluation pending. DVT prophylaxis: Systemic heparin, resume after thora. GI prophylaxis: N/A Lines: Dialysis Catheter Foley:  N/A Code Status:  full code Last date of multidisciplinary goals of care discussion [11/26: Patient wife was updated at bedside, recommend continue full scope of care]   CC time: 35 min.   Montey Hora, [facility] For pager details, please see AMION or use Epic chat  After 1900, please call [facility] for cross coverage needs 10-22-22, 8:02 AM

## 2022-10-20 NOTE — Progress Notes (Signed)
PCCM Interval Progress Note  Pt very uncomfortable and vitals worse. Discussed with nephrology and family regarding the current circumstances, poor prognosis, and likely poor quality of life for Mr. Arts.  We also discussed the patient's prior wishes under circumstances such as this.  The family has decided to offer full comfort care for him. They have been fully updated on the process and expectations and all questions have been answered.   Orders placed and emotional support offered.   Montey Hora, [facility] For pager details, please see AMION or use Epic chat  After 1900, please call [facility] for cross coverage needs 11/15/22, 12:32 PM

## 2022-10-20 NOTE — Death Summary Note (Signed)
DEATH SUMMARY   Patient Details  Name: Louis [name] MRN: [id number] DOB: 1946-07-24  Admission/Discharge Information   Admit Date:  11/12/2022  Date of Death: Date of Death: 2022-11-14  Time of Death: Time of Death: Feb 29, 1540 (called to hb)  Length of Stay: 2  Referring Physician: [name], MD   Reason(s) for Hospitalization  Septic shock due to pneumonia  Diagnoses  Preliminary cause of death:  Secondary Diagnoses (including complications and co-morbidities):  Principal Problem:   Hypotension Active Problems:   Septic shock ([place])   Lactic acidosis   Pressure injury of skin   Hypervolemia   Pleural effusion ESRD on HD Hyper phosphatemia Hyponatremia Chronic anemia Lactic acidosis Hand osteomyelitis  Acute RV failure  Brief Hospital Course (including significant findings, care, treatment, and services provided and events leading to death)  Louis [name] is a 76 y.o. year old male  with history of end-stage renal disease receives dialysis Tuesday Thursday Saturday, recently hospitalized for osteomyelitis involving the left hand, this was following recent left upper arm AV fistula ligation.  Ultimately required surgical intervention including extensive debridement, and amputation of the left thumb.  He was ultimately discharged on 10/25, with plans to complete a total of 6 weeks of intravenous Fortaz and daptomycin which he has since done.  He has been followed in the orthopedic clinic, his sutures have since been removed, follow-up exams surgical wise have been encouraging without evidence of infection. He presented to the emergency room on 11-13-2023 with family reporting approximately 2 to 2-1/2-day history of progressive weakness, increased fatigue, and poor p.o. intake.  He has been weak since his hospital/rehab discharge, however at baseline had gotten to a point where he could stand and pivot to a chair with a two-person assist.  His weakness had gotten so bad on day  of admission he could not not stand with full assist of 2 people.  He was able to eventually get to dialysis on 2023-11-13 at which time he was noted to be fairly hypotensive during the case with systolic blood pressure in the 80s.  He has been on midodrine since the early part of October, instructed to take prior to dialysis.  Because of his weakness, and hypotension, he presented to the emergency room. He developed stroke like symptoms during his admission without MRI demonstration of a stroke.  He declined while he was in the ICU. Family elected to withdraw aggressive care and focus on his comfort. He passed away with family at bedside on 11-15-23.  Pertinent Labs and Studies  Significant Diagnostic Studies DG Abd 1 View  Result Date: 11/14/2022 CLINICAL DATA:  Abdominal distention and pain. EXAM: ABDOMEN - 1 VIEW COMPARISON:  None Available. FINDINGS: The bowel gas pattern is normal. No radio-opaque calculi or other significant radiographic abnormality are seen. IMPRESSION: Negative. Electronically Signed   By: Brett Fairy M.D.   On: November 14, 2022 03:17   MR BRAIN WO CONTRAST  Result Date: 2022-11-14 CLINICAL DATA:  Weakness, hypotension EXAM: MRI HEAD WITHOUT CONTRAST TECHNIQUE: Multiplanar, multiecho pulse sequences of the brain and surrounding structures were obtained without intravenous contrast. COMPARISON:  No prior MRI available, correlation is made with CT head 10/15/2022 FINDINGS: Evaluation is significantly limited by motion artifact, even when using less motion sensitive sequences. Brain: No definite restricted diffusion to suggest acute or subacute infarct. No acute hemorrhage, mass, mass effect, or midline shift. No hemosiderin deposition to suggest remote hemorrhage. No hydrocephalus or extra-axial collection. Advanced cerebral volume loss for  age. Confluent T2 hyperintense signal in the periventricular white matter, likely the sequela of moderate chronic small vessel ischemic disease. Remote  posterior left frontal lobe infarct. Vascular: Normal arterial flow voids. Skull and upper cervical spine: Grossly normal marrow signal, although motion limited. Sinuses/Orbits: Chronic left maxillary sinusitis. Otherwise clear paranasal sinuses. Status post bilateral lens replacements. Other: The mastoids are well aerated. IMPRESSION: Evaluation is significantly limited by motion artifact, even when using less motion sensitive sequences. Within this limitation, no acute intracranial process. No evidence of acute or subacute infarct. Electronically Signed   By: Merilyn Baba M.D.   On: 11-11-2022 00:07   EEG adult  Result Date: 10/15/2022 Derek Jack, MD     10/15/2022  6:14 PM Routine EEG Report Louis [name] is a 76 y.o. male with a history of altered mental status who is undergoing an EEG to evaluate for seizures. Report: This EEG was acquired with electrodes placed according to the International 10-20 electrode system (including Fp1, Fp2, F3, F4, C3, C4, P3, P4, O1, O2, T3, T4, T5, T6, A1, A2, Fz, Cz, Pz). The following electrodes were missing or displaced: none. The occipital dominant rhythm was 5-6 Hz. This activity is reactive to stimulation. Drowsiness was manifested by background fragmentation; deeper stages of sleep were identified by K complexes and sleep spindles. There was no focal slowing. There were no interictal epileptiform discharges. There were no electrographic seizures identified. Photic stimulation and hyperventilation were not performed. Impression and clinical correlation: This EEG was obtained while awake and asleep and is abnormal due to mild diffuse slowing indicative of global cerebral dysfunction. Epileptiform abnormalities were not seen during this recording. Su Monks, MD [facility] [phone] If 7pm- 7am, please page neurology on call as listed in [place].   CT HEAD CODE STROKE WO CONTRAST`  Result Date: 10/15/2022 CLINICAL DATA:  Code stroke.   Altered mental status. EXAM: CT ANGIOGRAPHY HEAD AND NECK TECHNIQUE: Multidetector CT imaging of the head and neck was performed using the standard protocol during bolus administration of intravenous contrast. Multiplanar CT image reconstructions and MIPs were obtained to evaluate the vascular anatomy. Carotid stenosis measurements (when applicable) are obtained utilizing NASCET criteria, using the distal internal carotid diameter as the denominator. RADIATION DOSE REDUCTION: This exam was performed according to the departmental dose-optimization program which includes automated exposure control, adjustment of the mA and/or kV according to patient size and/or use of iterative reconstruction technique. CONTRAST:  61m OMNIPAQUE IOHEXOL 350 MG/ML SOLN COMPARISON:  CT head 02/02/2017 FINDINGS: CT HEAD FINDINGS Brain: There is no acute intracranial hemorrhage, extra-axial fluid collection, or acute infarct There is moderate parenchymal volume loss with prominence of the ventricular system and extra-axial CSF spaces. Gray-white differentiation is preserved. There is patchy and confluent hypodensity in the supratentorial white matter likely reflecting sequela of background chronic small-vessel ischemic change. There is no mass lesion. There is no mass effect or midline shift. See below. Without Vascular: See below. Skull: Normal. Negative for fracture or focal lesion. Sinuses/Orbits: There is complete opacification of the left maxillary sinus with high density material and calcifications as well as surrounding hyperostosis. Bilateral lens implants are in place. The globes and orbits are otherwise unremarkable. Other: None. Review of the MIP images confirms the above findings CTA NECK FINDINGS Aortic arch: There is calcified plaque in the aortic arch. The origins of the major branch vessels are patent. The subclavian arteries are patent to the level imaged. Right carotid system: The right common, internal, and external  carotid arteries are patent with mild plaque of the bifurcation and distal internal carotid artery but no hemodynamically significant stenosis or occlusion. There is no dissection or aneurysm. Left carotid system: The left common, internal, and external carotid arteries are patent with mild plaque of the bifurcation and distal internal carotid artery but no hemodynamically significant stenosis or occlusion. There is no dissection or aneurysm. Vertebral arteries: There is calcified plaque resulting in mild stenosis of the bilateral V1 segments. The vertebral arteries are otherwise patent, without hemodynamically significant stenosis or occlusion. There is no dissection or aneurysm. Skeleton: There is no acute osseous abnormality or suspicious osseous lesion. There is no visible canal hematoma. Other neck: The soft tissues of the neck are unremarkable. Upper chest: A left-sided dialysis catheter is in place. There are bilateral pleural effusions, as seen on CT chest from 1 day prior. Review of the MIP images confirms the above findings CTA HEAD FINDINGS Anterior circulation: There is calcified plaque in the carotid siphons resulting in up to mild stenosis bilaterally. The bilateral MCAs are patent, without proximal stenosis or occlusion. The bilateral ACAs are patent, without proximal stenosis or occlusion. There is no aneurysm or AVM. Posterior circulation: There is calcified plaque in the bilateral V4 segments resulting in mild-to-moderate stenosis on the right and no greater than mild stenosis on the left. The basilar artery is patent. The major cerebellar arteries appear patent. There is focal severe stenosis of the right P1 segment (9-126, 7-103). There is distal reconstitution without other high-grade stenosis or occlusion. The left PCA is patent, without proximal stenosis or occlusion. Posterior communicating arteries are not definitely seen. There is no aneurysm or AVM. Venous sinuses: Patent. Anatomic  variants: None. Review of the MIP images confirms the above findings IMPRESSION: 1. No acute intracranial pathology. Background parenchymal volume loss and chronic small-vessel ischemic change. 2. No emergent large vessel occlusion. 3. Mild calcified plaque in the bilateral carotid systems and bilateral V1 segments without hemodynamically significant stenosis or occlusion. 4. Mild calcified plaque in the carotid siphons, mild-to-moderate stenosis of the right V4 segment, and focal severe stenosis of the right P1 segment. Otherwise patent intracranial vasculature. 5. Chronic left maxillary sinusitis with complete opacification and hyperdense material which may reflect inspissated secretions and/or fungal colonization. Findings of the initial noncontrast head CT were paged via AMION to Dr Erlinda Hong at 3:40pm. The CTA results were paged at 3:48 pm. Electronically Signed   By: Valetta Mole M.D.   On: 10/15/2022 15:56   CT ANGIO HEAD NECK W WO CM (CODE STROKE)  Result Date: 10/15/2022 CLINICAL DATA:  Code stroke.  Altered mental status. EXAM: CT ANGIOGRAPHY HEAD AND NECK TECHNIQUE: Multidetector CT imaging of the head and neck was performed using the standard protocol during bolus administration of intravenous contrast. Multiplanar CT image reconstructions and MIPs were obtained to evaluate the vascular anatomy. Carotid stenosis measurements (when applicable) are obtained utilizing NASCET criteria, using the distal internal carotid diameter as the denominator. RADIATION DOSE REDUCTION: This exam was performed according to the departmental dose-optimization program which includes automated exposure control, adjustment of the mA and/or kV according to patient size and/or use of iterative reconstruction technique. CONTRAST:  44m OMNIPAQUE IOHEXOL 350 MG/ML SOLN COMPARISON:  CT head 02/02/2017 FINDINGS: CT HEAD FINDINGS Brain: There is no acute intracranial hemorrhage, extra-axial fluid collection, or acute infarct There is  moderate parenchymal volume loss with prominence of the ventricular system and extra-axial CSF spaces. Gray-white differentiation is preserved. There is patchy and confluent  hypodensity in the supratentorial white matter likely reflecting sequela of background chronic small-vessel ischemic change. There is no mass lesion. There is no mass effect or midline shift. See below. Without Vascular: See below. Skull: Normal. Negative for fracture or focal lesion. Sinuses/Orbits: There is complete opacification of the left maxillary sinus with high density material and calcifications as well as surrounding hyperostosis. Bilateral lens implants are in place. The globes and orbits are otherwise unremarkable. Other: None. Review of the MIP images confirms the above findings CTA NECK FINDINGS Aortic arch: There is calcified plaque in the aortic arch. The origins of the major branch vessels are patent. The subclavian arteries are patent to the level imaged. Right carotid system: The right common, internal, and external carotid arteries are patent with mild plaque of the bifurcation and distal internal carotid artery but no hemodynamically significant stenosis or occlusion. There is no dissection or aneurysm. Left carotid system: The left common, internal, and external carotid arteries are patent with mild plaque of the bifurcation and distal internal carotid artery but no hemodynamically significant stenosis or occlusion. There is no dissection or aneurysm. Vertebral arteries: There is calcified plaque resulting in mild stenosis of the bilateral V1 segments. The vertebral arteries are otherwise patent, without hemodynamically significant stenosis or occlusion. There is no dissection or aneurysm. Skeleton: There is no acute osseous abnormality or suspicious osseous lesion. There is no visible canal hematoma. Other neck: The soft tissues of the neck are unremarkable. Upper chest: A left-sided dialysis catheter is in place. There  are bilateral pleural effusions, as seen on CT chest from 1 day prior. Review of the MIP images confirms the above findings CTA HEAD FINDINGS Anterior circulation: There is calcified plaque in the carotid siphons resulting in up to mild stenosis bilaterally. The bilateral MCAs are patent, without proximal stenosis or occlusion. The bilateral ACAs are patent, without proximal stenosis or occlusion. There is no aneurysm or AVM. Posterior circulation: There is calcified plaque in the bilateral V4 segments resulting in mild-to-moderate stenosis on the right and no greater than mild stenosis on the left. The basilar artery is patent. The major cerebellar arteries appear patent. There is focal severe stenosis of the right P1 segment (9-126, 7-103). There is distal reconstitution without other high-grade stenosis or occlusion. The left PCA is patent, without proximal stenosis or occlusion. Posterior communicating arteries are not definitely seen. There is no aneurysm or AVM. Venous sinuses: Patent. Anatomic variants: None. Review of the MIP images confirms the above findings IMPRESSION: 1. No acute intracranial pathology. Background parenchymal volume loss and chronic small-vessel ischemic change. 2. No emergent large vessel occlusion. 3. Mild calcified plaque in the bilateral carotid systems and bilateral V1 segments without hemodynamically significant stenosis or occlusion. 4. Mild calcified plaque in the carotid siphons, mild-to-moderate stenosis of the right V4 segment, and focal severe stenosis of the right P1 segment. Otherwise patent intracranial vasculature. 5. Chronic left maxillary sinusitis with complete opacification and hyperdense material which may reflect inspissated secretions and/or fungal colonization. Findings of the initial noncontrast head CT were paged via AMION to Dr Erlinda Hong at 3:40pm. The CTA results were paged at 3:48 pm. Electronically Signed   By: Valetta Mole M.D.   On: 10/15/2022 15:56    ECHOCARDIOGRAM COMPLETE  Result Date: 10/15/2022    ECHOCARDIOGRAM REPORT   Patient Name:   Louis [name] Date of Exam: 10/15/2022 Medical Rec #:  [id number]       Height:       30.0  in Accession #:    [id number]      Weight:       229.5 lb Date of Birth:  1946-03-25       BSA:          2.144 m Patient Age:    75 years        BP:           83/57 mmHg Patient Gender: M               HR:           87 bpm. Exam Location:  Inpatient Procedure: 2D Echo, Cardiac Doppler, Color Doppler and Intracardiac            Opacification Agent Indications:    Atrial fibrillation  History:        Patient has prior history of Echocardiogram examinations, most                 recent 12/15/2018. CAD, Arrythmias:RBBB and Atrial Fibrillation;                 Risk Factors:Diabetes, Sleep Apnea, Dyslipidemia and Former                 Smoker. CKD.  Sonographer:    Clayton Lefort RDCS (AE) Referring Phys: [place]  1. Left ventricular ejection fraction, by estimation, is 45 to 50%. The left ventricle has mildly decreased function. The left ventricle demonstrates regional wall motion abnormalities (see scoring diagram/findings for description). Left ventricular diastolic function could not be evaluated. There is mild hypokinesis of the left ventricular, entire apical segment. Abnormal septal motion suggests increased work of breathing (e.g. COPD exacerbation).  2. Right ventricular systolic function is moderately reduced. The right ventricular size is moderately enlarged. Tricuspid regurgitation signal is inadequate for assessing PA pressure.  3. Left atrial size was mildly dilated.  4. Right atrial size was mildly dilated.  5. The mitral valve is normal in structure. Mild to moderate mitral valve regurgitation. No evidence of mitral stenosis.  6. The aortic valve is tricuspid. There is mild calcification of the aortic valve. There is moderate thickening of the aortic valve. Aortic valve regurgitation is not visualized.  Aortic valve sclerosis/calcification is present, without any evidence of aortic stenosis.  7. The inferior vena cava is dilated in size with >50% respiratory variability, suggesting right atrial pressure of 8 mmHg. Comparison(s): Prior images reviewed side by side. The left ventricular function is worsened. The right ventricular systolic function is significantly worse. The left ventricular wall motion abnormality is new. Known occluded mid LAD since 2012. FINDINGS  Left Ventricle: Left ventricular ejection fraction, by estimation, is 45 to 50%. The left ventricle has mildly decreased function. The left ventricle demonstrates regional wall motion abnormalities. Mild hypokinesis of the left ventricular, entire apical segment. Definity contrast agent was given IV to delineate the left ventricular endocardial borders. The left ventricular internal cavity size was normal in size. There is no left ventricular hypertrophy. Left ventricular diastolic function could not be evaluated due to atrial fibrillation. Left ventricular diastolic function could not be evaluated.  LV Wall Scoring: The entire apex is hypokinetic. Abnormal septal motion suggests increased work of breathing (e.g. COPD exacerbation). Right Ventricle: The right ventricular size is moderately enlarged. No increase in right ventricular wall thickness. Right ventricular systolic function is moderately reduced. Tricuspid regurgitation signal is inadequate for assessing PA pressure. Left Atrium: Left atrial size was mildly dilated. Right Atrium: Right atrial  size was mildly dilated. Pericardium: There is no evidence of pericardial effusion. Mitral Valve: The mitral valve is normal in structure. Mild mitral annular calcification. Mild to moderate mitral valve regurgitation, with eccentric laterally directed jet. No evidence of mitral valve stenosis. Tricuspid Valve: The tricuspid valve is grossly normal. Tricuspid valve regurgitation is not demonstrated. Aortic  Valve: The aortic valve is tricuspid. There is mild calcification of the aortic valve. There is moderate thickening of the aortic valve. Aortic valve regurgitation is not visualized. Aortic valve sclerosis/calcification is present, without any evidence of aortic stenosis. Aortic valve mean gradient measures 2.7 mmHg. Aortic valve peak gradient measures 4.2 mmHg. Aortic valve area, by VTI measures 2.48 cm. Pulmonic Valve: The pulmonic valve was grossly normal. Pulmonic valve regurgitation is not visualized. Aorta: The aortic root and ascending aorta are structurally normal, with no evidence of dilitation. Venous: The inferior vena cava is dilated in size with greater than 50% respiratory variability, suggesting right atrial pressure of 8 mmHg. IAS/Shunts: No atrial level shunt detected by color flow Doppler.  LEFT VENTRICLE PLAX 2D LVIDd:         4.80 cm LVIDs:         4.10 cm LV PW:         1.40 cm LV IVS:        1.50 cm LVOT diam:     1.90 cm LV SV:         35 LV SV Index:   16 LVOT Area:     2.84 cm  RIGHT VENTRICLE            IVC RV Basal diam:  3.10 cm    IVC diam: 2.00 cm RV S prime:     5.63 cm/s TAPSE (M-mode): 0.5 cm LEFT ATRIUM             Index        RIGHT ATRIUM           Index LA diam:        4.40 cm 2.05 cm/m   RA Area:     20.51 cm LA Vol (A2C):   55.4 ml 25.84 ml/m  RA Volume:   37.70 ml  17.58 ml/m LA Vol (A4C):   67.9 ml 31.67 ml/m LA Biplane Vol: 66.2 ml 30.88 ml/m  AORTIC VALVE AV Area (Vmax):    2.06 cm AV Area (Vmean):   1.83 cm AV Area (VTI):     2.48 cm AV Vmax:           101.93 cm/s AV Vmean:          79.000 cm/s AV VTI:            0.142 m AV Peak Grad:      4.2 mmHg AV Mean Grad:      2.7 mmHg LVOT Vmax:         74.23 cm/s LVOT Vmean:        50.967 cm/s LVOT VTI:          0.124 m LVOT/AV VTI ratio: 0.87  AORTA Ao Root diam: 3.20 cm Ao Asc diam:  3.40 cm  SHUNTS Systemic VTI:  0.12 m Systemic Diam: 1.90 cm Dani Gobble Croitoru MD Electronically signed by Sanda Klein MD Signature  Date/Time: 10/15/2022/12:43:15 PM    Final    CT Angio Chest PE W and/or Wo Contrast  Result Date: 09/20/2022 CLINICAL DATA:  76 year old male with chest and abdominal pain. End-stage renal disease on dialysis. EXAM: CT  ANGIOGRAPHY CHEST CT ABDOMEN AND PELVIS WITH CONTRAST TECHNIQUE: Multidetector CT imaging of the chest was performed using the standard protocol during bolus administration of intravenous contrast. Multiplanar CT image reconstructions and MIPs were obtained to evaluate the vascular anatomy. Multidetector CT imaging of the abdomen and pelvis was performed using the standard protocol during bolus administration of intravenous contrast. RADIATION DOSE REDUCTION: This exam was performed according to the departmental dose-optimization program which includes automated exposure control, adjustment of the mA and/or kV according to patient size and/or use of iterative reconstruction technique. CONTRAST:  85m OMNIPAQUE IOHEXOL 350 MG/ML SOLN COMPARISON:  None Available. 08/31/2022 chest radiograph. FINDINGS: CTA CHEST FINDINGS Cardiovascular: This is a technically adequate study. There is non opacification of lobar and segmental arteries of the lingula and LEFT LOWER lobe and to a lesser extent the RIGHT LOWER lobe. On the 1st scan, there is decreased attenuation of contrast into the areas of non opacified arteries which could represent flow/pressure related non opacification. However, on repeat imaging with delayed triggering, there is a similar appearance with contrast noted in the pulmonary veins and LEFT atrium. On the CT of the abdomen study, there appears to be opacification of LOWER lobe pulmonary arteries. RV/LV ratio measures 1.0. UPPER limits normal heart size noted. Small pericardial effusion is noted. Coronary artery and aortic atherosclerotic calcifications are present. There is no evidence of thoracic aortic aneurysm. Mediastinum/Nodes: No enlarged mediastinal, hilar, or axillary lymph  nodes. Thyroid gland, trachea, and esophagus demonstrate no significant findings. Lungs/Pleura: Moderate to large bilateral pleural effusions are noted with associated atelectasis, LEFT greater than RIGHT. LEFT basilar opacities/atelectasis noted. There is no evidence of pneumothorax. Musculoskeletal: No acute or suspicious bony abnormalities are noted. Diffuse subcutaneous edema noted. Review of the MIP images confirms the above findings. CT ABDOMEN and PELVIS FINDINGS Hepatobiliary: No definite liver or hepatic abnormality noted. There is no evidence of intrahepatic or extrahepatic biliary dilatation. Pancreas: Unremarkable Spleen: Unremarkable Adrenals/Urinary Tract: Bilateral renal atrophy noted. Renal cysts are noted and no imaging follow-up recommended. No significant adrenal or bladder abnormalities noted. Stomach/Bowel: Stomach is within normal limits. Appendix appears normal. No evidence of bowel wall thickening, distention, or inflammatory changes. Vascular/Lymphatic: Aortic atherosclerosis. No enlarged abdominal or pelvic lymph nodes. Reproductive: Prostate is unremarkable. Other: No ascites, focal collection or pneumoperitoneum. Subcutaneous edema is identified. Musculoskeletal: No acute or suspicious bony abnormalities are noted. Review of the MIP images confirms the above findings. IMPRESSION: 1. Non opacification of lobar and segmental arteries of the lingula and LEFT LOWER lobe and to a lesser extent the RIGHT LOWER lobe, persisting on a repeat scan with delayed triggering - equivocal for pulmonary emboli. This appearance is more suggestive of flow/pressure related non opacification, and there appears to be opacification of the LEFT LOWER lobe pulmonary arteries on the CT abdomen study. RV/LV ratio measures 1 which would signify RIGHT heart strain if pulmonary emboli are present. 2. Moderate to large bilateral pleural effusions with associated atelectasis, LEFT greater than RIGHT. LEFT basilar  opacities/atelectasis and pneumonia or aspiration is not excluded. 3. UPPER limits normal heart size and small pericardial effusion. 4. Diffuse subcutaneous edema. 5. No evidence of acute abnormality within the abdomen or pelvis. Critical Value/emergent results were called by telephone at the time of interpretation on 10/07/2022 at 5:44 pm [facility] who verbally acknowledged these results. Electronically Signed   By: JMargarette CanadaM.D.   On: 09/22/2022 17:44   CT ABDOMEN PELVIS W CONTRAST  Result Date: 10/08/2022  CLINICAL DATA:  76 year old male with chest and abdominal pain. End-stage renal disease on dialysis. EXAM: CT ANGIOGRAPHY CHEST CT ABDOMEN AND PELVIS WITH CONTRAST TECHNIQUE: Multidetector CT imaging of the chest was performed using the standard protocol during bolus administration of intravenous contrast. Multiplanar CT image reconstructions and MIPs were obtained to evaluate the vascular anatomy. Multidetector CT imaging of the abdomen and pelvis was performed using the standard protocol during bolus administration of intravenous contrast. RADIATION DOSE REDUCTION: This exam was performed according to the departmental dose-optimization program which includes automated exposure control, adjustment of the mA and/or kV according to patient size and/or use of iterative reconstruction technique. CONTRAST:  30m OMNIPAQUE IOHEXOL 350 MG/ML SOLN COMPARISON:  None Available. 08/31/2022 chest radiograph. FINDINGS: CTA CHEST FINDINGS Cardiovascular: This is a technically adequate study. There is non opacification of lobar and segmental arteries of the lingula and LEFT LOWER lobe and to a lesser extent the RIGHT LOWER lobe. On the 1st scan, there is decreased attenuation of contrast into the areas of non opacified arteries which could represent flow/pressure related non opacification. However, on repeat imaging with delayed triggering, there is a similar appearance with contrast noted in the  pulmonary veins and LEFT atrium. On the CT of the abdomen study, there appears to be opacification of LOWER lobe pulmonary arteries. RV/LV ratio measures 1.0. UPPER limits normal heart size noted. Small pericardial effusion is noted. Coronary artery and aortic atherosclerotic calcifications are present. There is no evidence of thoracic aortic aneurysm. Mediastinum/Nodes: No enlarged mediastinal, hilar, or axillary lymph nodes. Thyroid gland, trachea, and esophagus demonstrate no significant findings. Lungs/Pleura: Moderate to large bilateral pleural effusions are noted with associated atelectasis, LEFT greater than RIGHT. LEFT basilar opacities/atelectasis noted. There is no evidence of pneumothorax. Musculoskeletal: No acute or suspicious bony abnormalities are noted. Diffuse subcutaneous edema noted. Review of the MIP images confirms the above findings. CT ABDOMEN and PELVIS FINDINGS Hepatobiliary: No definite liver or hepatic abnormality noted. There is no evidence of intrahepatic or extrahepatic biliary dilatation. Pancreas: Unremarkable Spleen: Unremarkable Adrenals/Urinary Tract: Bilateral renal atrophy noted. Renal cysts are noted and no imaging follow-up recommended. No significant adrenal or bladder abnormalities noted. Stomach/Bowel: Stomach is within normal limits. Appendix appears normal. No evidence of bowel wall thickening, distention, or inflammatory changes. Vascular/Lymphatic: Aortic atherosclerosis. No enlarged abdominal or pelvic lymph nodes. Reproductive: Prostate is unremarkable. Other: No ascites, focal collection or pneumoperitoneum. Subcutaneous edema is identified. Musculoskeletal: No acute or suspicious bony abnormalities are noted. Review of the MIP images confirms the above findings. IMPRESSION: 1. Non opacification of lobar and segmental arteries of the lingula and LEFT LOWER lobe and to a lesser extent the RIGHT LOWER lobe, persisting on a repeat scan with delayed triggering - equivocal  for pulmonary emboli. This appearance is more suggestive of flow/pressure related non opacification, and there appears to be opacification of the LEFT LOWER lobe pulmonary arteries on the CT abdomen study. RV/LV ratio measures 1 which would signify RIGHT heart strain if pulmonary emboli are present. 2. Moderate to large bilateral pleural effusions with associated atelectasis, LEFT greater than RIGHT. LEFT basilar opacities/atelectasis and pneumonia or aspiration is not excluded. 3. UPPER limits normal heart size and small pericardial effusion. 4. Diffuse subcutaneous edema. 5. No evidence of acute abnormality within the abdomen or pelvis. Critical Value/emergent results were called by telephone at the time of interpretation on 10/02/2022 at 5:44 pm [facility] who verbally acknowledged these results. Electronically Signed   By: JMargarette Canada  M.D.   On: 09/26/2022 17:44    Microbiology Recent Results (from the past 240 hour(s))  Resp Panel by RT-PCR (Flu A&B, Covid) Anterior Nasal Swab     Status: None   Collection Time: 09/27/2022  1:05 PM   Specimen: Anterior Nasal Swab  Result Value Ref Range Status   SARS Coronavirus 2 by RT PCR NEGATIVE NEGATIVE Final    Comment: (NOTE) SARS-CoV-2 target nucleic acids are NOT DETECTED.  The SARS-CoV-2 RNA is generally detectable in upper respiratory specimens during the acute phase of infection. The lowest concentration of SARS-CoV-2 viral copies this assay can detect is 138 copies/mL. A negative result does not preclude SARS-Cov-2 infection and should not be used as the sole basis for treatment or other patient management decisions. A negative result may occur with  improper specimen collection/handling, submission of specimen other than nasopharyngeal swab, presence of viral mutation(s) within the areas targeted by this assay, and inadequate number of viral copies(<138 copies/mL). A negative result must be combined with clinical observations,  patient history, and epidemiological information. The expected result is Negative.  Fact Sheet for Patients:  EntrepreneurPulse.com.au  Fact Sheet for Healthcare Providers:  IncredibleEmployment.be  This test is no t yet approved or cleared by the [place] FDA and  has been authorized for detection and/or diagnosis of SARS-CoV-2 by FDA under an Emergency Use Authorization (EUA). This EUA will remain  in effect (meaning this test can be used) for the duration of the COVID-19 declaration under Section 564(b)(1) of the Act, 21 U.S.C.section 360bbb-3(b)(1), unless the authorization is terminated  or revoked sooner.       Influenza A by PCR NEGATIVE NEGATIVE Final   Influenza B by PCR NEGATIVE NEGATIVE Final    Comment: (NOTE) The Xpert Xpress SARS-CoV-2/FLU/RSV plus assay is intended as an aid in the diagnosis of influenza from Nasopharyngeal swab specimens and should not be used as a sole basis for treatment. Nasal washings and aspirates are unacceptable for Xpert Xpress SARS-CoV-2/FLU/RSV testing.  Fact Sheet for Patients: EntrepreneurPulse.com.au  Fact Sheet for Healthcare Providers: IncredibleEmployment.be  This test is not yet approved or cleared by the [place] FDA and has been authorized for detection and/or diagnosis of SARS-CoV-2 by FDA under an Emergency Use Authorization (EUA). This EUA will remain in effect (meaning this test can be used) for the duration of the COVID-19 declaration under Section 564(b)(1) of the Act, 21 U.S.C. section 360bbb-3(b)(1), unless the authorization is terminated or revoked.  Performed at [facility], [place] [address] [place], [place] [address]   Blood culture (routine x 2)     Status: Abnormal (Preliminary result)   Collection Time: 10/18/2022  3:17 PM   Specimen: BLOOD RIGHT FOREARM  Result Value Ref Range Status   Specimen Description BLOOD RIGHT  FOREARM  Final   Special Requests   Final    BOTTLES DRAWN AEROBIC AND ANAEROBIC Blood Culture results may not be optimal due to an inadequate volume of blood received in culture bottles   Culture  Setup Time   Final    GRAM POSITIVE COCCI IN CLUSTERS CRITICAL RESULT CALLED TO, READ BACK BY AND VERIFIED WITH: PHARMD GRACE BARR [id number] AT 65 BY EC IN BOTH AEROBIC AND ANAEROBIC BOTTLES    Culture (A)  Final    STAPHYLOCOCCUS EPIDERMIDIS THE SIGNIFICANCE OF ISOLATING THIS ORGANISM FROM A SINGLE SET OF BLOOD CULTURES WHEN MULTIPLE SETS ARE DRAWN IS UNCERTAIN. PLEASE NOTIFY THE MICROBIOLOGY DEPARTMENT WITHIN ONE WEEK IF SPECIATION AND  SENSITIVITIES ARE REQUIRED. Performed at [facility], [place] [address], [place], [place] [address]    Report Status PENDING  Incomplete  Blood Culture ID Panel (Reflexed)     Status: Abnormal   Collection Time: 10/03/2022  3:17 PM  Result Value Ref Range Status   Enterococcus faecalis NOT DETECTED NOT DETECTED Final   Enterococcus Faecium NOT DETECTED NOT DETECTED Final   Listeria monocytogenes NOT DETECTED NOT DETECTED Final   Staphylococcus species DETECTED (A) NOT DETECTED Final    Comment: CRITICAL RESULT CALLED TO, READ BACK BY AND VERIFIED WITH: PHARMD GRACE BARR [id number] AT 1036 BY EC    Staphylococcus aureus (BCID) NOT DETECTED NOT DETECTED Final   Staphylococcus epidermidis DETECTED (A) NOT DETECTED Final    Comment: Methicillin (oxacillin) resistant coagulase negative staphylococcus. Possible blood culture contaminant (unless isolated from more than one blood culture draw or clinical case suggests pathogenicity). No antibiotic treatment is indicated for blood  culture contaminants. CRITICAL RESULT CALLED TO, READ BACK BY AND VERIFIED WITH: PHARMD GRACE Aris Lot [id number] AT 39 BY EC    Staphylococcus lugdunensis NOT DETECTED NOT DETECTED Final   Streptococcus species NOT DETECTED NOT DETECTED Final   Streptococcus agalactiae NOT DETECTED NOT  DETECTED Final   Streptococcus pneumoniae NOT DETECTED NOT DETECTED Final   Streptococcus pyogenes NOT DETECTED NOT DETECTED Final   A.calcoaceticus-baumannii NOT DETECTED NOT DETECTED Final   Bacteroides fragilis NOT DETECTED NOT DETECTED Final   Enterobacterales NOT DETECTED NOT DETECTED Final   Enterobacter cloacae complex NOT DETECTED NOT DETECTED Final   Escherichia coli NOT DETECTED NOT DETECTED Final   Klebsiella aerogenes NOT DETECTED NOT DETECTED Final   Klebsiella oxytoca NOT DETECTED NOT DETECTED Final   Klebsiella pneumoniae NOT DETECTED NOT DETECTED Final   Proteus species NOT DETECTED NOT DETECTED Final   Salmonella species NOT DETECTED NOT DETECTED Final   Serratia marcescens NOT DETECTED NOT DETECTED Final   Haemophilus influenzae NOT DETECTED NOT DETECTED Final   Neisseria meningitidis NOT DETECTED NOT DETECTED Final   Pseudomonas aeruginosa NOT DETECTED NOT DETECTED Final   Stenotrophomonas maltophilia NOT DETECTED NOT DETECTED Final   Candida albicans NOT DETECTED NOT DETECTED Final   Candida auris NOT DETECTED NOT DETECTED Final   Candida glabrata NOT DETECTED NOT DETECTED Final   Candida krusei NOT DETECTED NOT DETECTED Final   Candida parapsilosis NOT DETECTED NOT DETECTED Final   Candida tropicalis NOT DETECTED NOT DETECTED Final   Cryptococcus neoformans/gattii NOT DETECTED NOT DETECTED Final   Methicillin resistance mecA/C DETECTED (A) NOT DETECTED Final    Comment: CRITICAL RESULT CALLED TO, READ BACK BY AND VERIFIED WITH: PHARMD GRACE Aris Lot [id number] AT 1036 BY EC Performed at [facility], [address], [place], [place] [address]   MRSA Next Gen by PCR, Nasal     Status: None   Collection Time: 10/15/2022  8:44 PM   Specimen: Nasal Mucosa; Nasal Swab  Result Value Ref Range Status   MRSA by PCR Next Gen NOT DETECTED NOT DETECTED Final    Comment: (NOTE) The GeneXpert MRSA Assay (FDA approved for NASAL specimens only), is one component of a  comprehensive MRSA colonization surveillance program. It is not intended to diagnose MRSA infection nor to guide or monitor treatment for MRSA infections. Test performance is not FDA approved in patients less than 91 years old. Performed at [facility], [place] [address] [place], [place] [address]   Blood culture (routine x 2)  Status: None (Preliminary result)   Collection Time: 10/09/2022  9:38 PM   Specimen: BLOOD LEFT HAND  Result Value Ref Range Status   Specimen Description BLOOD LEFT HAND  Final   Special Requests   Final    BOTTLES DRAWN AEROBIC AND ANAEROBIC Blood Culture adequate volume   Culture   Final    NO GROWTH 2 DAYS Performed at [facility], [address], [place], [place] [address]    Report Status PENDING  Incomplete    Lab Basic Metabolic Panel: Recent Labs  Lab 10/12/2022 1345 09/22/2022 2200 10/15/22 0543 10/15/22 1339 10/15/22 1501 10/15/22 1700 10/15/22 1750 10-20-22 0537  NA 133* 131* 132*  --  128*  --  135 131*  K 4.4 4.6 4.5  --  4.5  --  5.3* 5.1  CL 92* 93* 93*  --   --   --  90* 91*  CO2 '26 23 22  '$ --   --   --  15* 21*  GLUCOSE 108* 180* 189* 133*  --  135* 154* 156*  BUN '15 18 20  '$ --   --   --  25* 28*  CREATININE 3.24* 3.80* 4.18*  --   --   --  4.79* 5.01*  CALCIUM 8.9 8.6* 8.6*  --   --   --  8.9 8.8*  MG  --   --  2.2  --   --   --   --   --   PHOS  --  3.9  --   --   --   --   --  5.9*   Liver Function Tests: Recent Labs  Lab 09/28/2022 1345 10/03/2022 2200 10-20-22 0537  AST 72*  --   --   ALT 35  --   --   ALKPHOS 220*  --   --   BILITOT 1.3*  --   --   PROT 6.4*  --   --   ALBUMIN 2.2* 2.0* 1.7*   Recent Labs  Lab 10/13/2022 1801  LIPASE 39   Recent Labs  Lab 10/15/22 1555  AMMONIA 35   CBC: Recent Labs  Lab 09/20/2022 1345 10/01/2022 2200 10/15/22 0543 10/15/22 1501 10/15/22 1555 Oct 20, 2022 0537  WBC 9.0 8.8 8.2  --  16.1* 9.6  NEUTROABS 7.6  --   --   --   --  8.5*  HGB 10.5* 9.4* 9.0* 10.9* 10.5*  10.5*  HCT 34.7* 31.4* 31.3* 32.0* 37.4* 35.7*  MCV 106.4* 107.2* 109.1*  --  116.1* 107.5*  PLT 159 168 148*  --  149* 169   Cardiac Enzymes: No results for input(s): "CKTOTAL", "CKMB", "CKMBINDEX", "TROPONINI" in the last 168 hours. Sepsis Labs: Recent Labs  Lab 09/27/2022 1801 09/24/2022 2140 09/29/2022 2200 10/15/22 0543 10/15/22 1555 10/15/22 1750 10/15/22 2144 2022/10/20 0537  PROCALCITON 0.95  --   --   --   --   --   --   --   WBC  --   --  8.8 8.2 16.1*  --   --  9.6  LATICACIDVEN 2.6*   < >  --   --  >9.0* >9.0* 6.0* 7.1*   < > = values in this interval not displayed.    Procedures/Operations  EEG   Julian Hy 10-20-2022, 3:40 PM

## 2022-10-20 NOTE — Progress Notes (Signed)
SLP Cancellation Note  Patient Details Name: Louis [name] MRN: [id number] DOB: Apr 28, 1946   Cancelled treatment:       Reason Eval/Treat Not Completed: Patient at procedure or test/unavailable. Pt NPO for thoracentesis. Recommend resuming Dys 2/nectar. Sips of water permitted. Will f/u tomorrow to assess   Nickia Boesen, Katherene Ponto 11/15/22, 11:44 AM

## 2022-10-20 NOTE — Progress Notes (Deleted)
HPI: FU CAD, diastolic CHF and PAF. 2/63 ABIs were normal. Carotid dopplers 4/18 showed less than 50% bilaterally. Monitor 4/19 showed recurrent atrial fibrillation. Cardiac catheterization January 2020 showed 60% left main, 50% circumflex, occluded LAD, 50% first diagonal, 60% mid and distal right coronary artery and 60% posterior lateral branch. FFR of left main 0.95.  Pulmonary capillary wedge pressure 24 with PA pressure 68/23.  Medical therapy recommended. Patient is now on dialysis. Echocardiogram June 2020 performed at [facility] showed normal LV function, moderate left ventricular hypertrophy, moderate left atrial enlargement and mild mitral regurgitation. Right heart catheterization [facility] July 2020 showed right atrial pressure of 11, pulmonary artery pressure of 58/20, pulmonary capillary wedge pressure of 24. Patient turned down for transplant and now on dialysis.  Since last seen   Current Outpatient Medications  Medication Sig Dispense Refill   B-D INS SYR ULTRAFINE 1CC/31G 31G X 5/16" 1 ML MISC      blood glucose meter kit and supplies Use to check blood sugars daily E11.65     cefTAZidime 2 g in sodium chloride 0.9 % 100 mL Inject 2 g into the vein every Monday, Wednesday, and Friday with hemodialysis.     Cholecalciferol (VITAMIN D3) 50 MCG (2000 UT) TABS Take 2,000 Units by mouth daily.      Coenzyme Q10 (COQ10) 200 MG CAPS Take 200 mg by mouth daily.     Continuous Blood Gluc Receiver (FREESTYLE LIBRE 14 DAY READER) DEVI Dx DM E11.22 Check blood sugar 4 times daily. 1 each prn   Continuous Blood Gluc Sensor (FREESTYLE LIBRE 14 DAY SENSOR) MISC Dx DM E11.22 Check blood sugar 4 times daily. 6 each prn   DAPTOmycin 650 mg in sodium chloride 0.9 % 50 mL Inject 650 mg into the vein as directed. After dialysis on Mondays and Wednesdays.     DAPTOmycin 950 mg in sodium chloride 0.9 % 50 mL Inject 950 mg into the vein every Friday at 6 PM. After dialysis on Fridays.      Doxercalciferol (HECTOROL IV) Doxercalciferol (Hectorol)     ELIQUIS 5 MG TABS tablet Take 1 tablet (5 mg total) by mouth 2 (two) times daily. 180 tablet 1   furosemide (LASIX) 40 MG tablet TAKE 1 TABLET BY MOUTH ONCE DAILY AS NEEDED 30 tablet 0   lidocaine-prilocaine (EMLA) cream SMARTSIG:sparingly Topical As Directed     Methoxy PEG-Epoetin Beta (MIRCERA IJ) Mircera     midodrine (PROAMATINE) 10 MG tablet Take 10 mg by mouth as directed. Take 30 min. Before dialysis treatment     nitroGLYCERIN (NITROSTAT) 0.4 MG SL tablet Place 1 tablet (0.4 mg total) under the tongue every 5 (five) minutes as needed for chest pain. 25 tablet 11   NOVOLIN N 100 UNIT/ML injection Inject 10 Units into the skin 2 (two) times daily before a meal.     NOVOLIN R 100 UNIT/ML injection Inject 1-15 Units into the skin 3 (three) times daily with meals. Sliding Scale  5   omega-3 acid ethyl esters (LOVAZA) 1 g capsule Take 1 g by mouth daily.     ONETOUCH ULTRA test strip USE 1 STRIP TO CHECK GLUCOSE THREE TIMES DAILY 100 each 6   Probiotic Product (PROBIOTIC DAILY PO) Take 420 mg by mouth daily.     rosuvastatin (CRESTOR) 40 MG tablet Take 1 tablet by mouth once daily (Patient taking differently: Take 40 mg by mouth daily.) 90 tablet 3   silver sulfADIAZINE (SILVADENE) 1 %  cream Apply 1 Application topically daily as needed (wound care, itching).     Vitamin Mixture (VITAMIN E COMPLETE PO) Take 1 capsule by mouth daily. Vitamin E complex     ZINC SULFATE PO Take 1 tablet by mouth daily.      No current facility-administered medications for this visit.     Past Medical History:  Diagnosis Date   Arthritis    Brittle bones    per pt, has soft bones in right foot/wears boot cast!   CAD (coronary artery disease)    Cancer ([place])    skin cancer on arm   Cataract    Bil/ surg scheduled for right eye 01/18/17/ left eye 02/08/17   Charcot ankle, right 2019   CHF (congestive heart failure) ([place]) 2015   Diabetes  mellitus    Type 2   ESRD (end stage renal disease) on dialysis ([place])    [facility]   Heart failure, diastolic ([place])    History of kidney stones    Hyperlipidemia    Hypertension    Macular degeneration disease    Macular edema 2014   OSA on CPAP    Paroxysmal atrial fibrillation ([place])    Personal history of colonic polyps - adenomas 01/28/2014   Shortness of breath dyspnea    Syncope and collapse     Past Surgical History:  Procedure Laterality Date   AV FISTULA PLACEMENT Left 09/01/2020   Procedure: LEFT ARM ARTERIOVENOUS (AV) FISTULA;  Surgeon: Waynetta Sandy, MD;  Location: [place];  Service: Vascular;  Laterality: Left;   [place] Left 10/27/2020   Procedure: LEFT SECOND STAGE BASILIC VEIN FISTULA TRANSPOSITION;  Surgeon: Waynetta Sandy, MD;  Location: [place];  Service: Vascular;  Laterality: Left;   BIOPSY  09/05/2022   Procedure: BIOPSY;  Surgeon: Ladene Artist, MD;  Location: [place];  Service: Gastroenterology;;   CARPAL TUNNEL RELEASE     left hand   COLONOSCOPY     ESOPHAGOGASTRODUODENOSCOPY (EGD) WITH PROPOFOL N/A 09/05/2022   Procedure: ESOPHAGOGASTRODUODENOSCOPY (EGD) WITH PROPOFOL;  Surgeon: Ladene Artist, MD;  Location: [place];  Service: Gastroenterology;  Laterality: N/A;   I & D EXTREMITY Left 08/30/2022   Procedure: IRRIGATION AND DEBRIDEMENT LEFT THUMB;  Surgeon: Iran Planas, MD;  Location: [place];  Service: Orthopedics;  Laterality: Left;   I & D EXTREMITY Left 09/04/2022   Procedure: IRRIGATION AND DEBRIDEMENT LEFT THUMB POSSIBLE AMPUTATION;  Surgeon: Iran Planas, MD;  Location: [place];  Service: Orthopedics;  Laterality: Left;   INSERTION OF DIALYSIS CATHETER Left 08/29/2022   Procedure: INSERTION OF DIALYSIS CATHETER;  Surgeon: Angelia Mould, MD;  Location: [place];  Service: Vascular;  Laterality: Left;   INTRAVASCULAR PRESSURE WIRE/FFR STUDY N/A 12/18/2018   Procedure: INTRAVASCULAR  PRESSURE WIRE/FFR STUDY;  Surgeon: Wellington Hampshire, MD;  Location: [place] [facility];  Service: Cardiovascular;  Laterality: N/A;   IR FLUORO GUIDE CV LINE RIGHT  12/16/2018   IR US GUIDE VASC ACCESS RIGHT  12/16/2018   KNEE ARTHROSCOPY Right 09/13/2016   [facility], Dr. Dorna Leitz   LIGATION OF ARTERIOVENOUS  FISTULA Left 08/29/2022   Procedure: LIGATION OF ARTERIOVENOUS  FISTULA LEFT ARM;  Surgeon: Angelia Mould, MD;  Location: [facility];  Service: Vascular;  Laterality: Left;   PILONIDAL CYST EXCISION     RIGHT/LEFT HEART CATH AND CORONARY ANGIOGRAPHY N/A 12/18/2018   Procedure: RIGHT/LEFT HEART CATH AND CORONARY ANGIOGRAPHY;  Surgeon: Wellington Hampshire, MD;  Location: [place] [facility];  Service: Cardiovascular;  Laterality: N/A;   SAVORY DILATION N/A 09/05/2022   Procedure: SAVORY DILATION;  Surgeon: Ladene Artist, MD;  Location: [place];  Service: Gastroenterology;  Laterality: N/A;    Social History   Socioeconomic History   Marital status: Married    Spouse name: [name]   Number of children: 5   Years of education: 12   Highest education level: 12th grade  Occupational History   Occupation: Warehouse    Comment: retired  Tobacco Use   Smoking status: Former    Packs/day: 0.50    Years: 20.00    Total pack years: 10.00    Types: Cigarettes    Quit date: 03/06/1977    Years since quitting: 45.6   Smokeless tobacco: Never  Vaping Use   Vaping Use: Never used  Substance and Sexual Activity   Alcohol use: No   Drug use: No   Sexual activity: Not Currently  Other Topics Concern   Not on file  Social History Narrative   Lives with his wife. She is his caretaker. They have 18 grandchildren. He enjoys watching sports as he is not able to do much due to neuropathy.   Social Determinants of Health   Financial Resource Strain: Low Risk  (03/27/2022)   Overall Financial Resource Strain (CARDIA)    Difficulty of Paying Living Expenses: Not hard at all   Food Insecurity: No Food Insecurity (08/29/2022)   Hunger Vital Sign    Worried About Running Out of Food in the Last Year: Never true    Ran Out of Food in the Last Year: Never true  Transportation Needs: No Transportation Needs (08/29/2022)   PRAPARE - Hydrologist (Medical): No    Lack of Transportation (Non-Medical): No  Physical Activity: Inactive (03/27/2022)   Exercise Vital Sign    Days of Exercise per Week: 0 days    Minutes of Exercise per Session: 0 min  Stress: No Stress Concern Present (03/27/2022)   [place]    Feeling of Stress : Not at all  Social Connections: Moderately Integrated (03/27/2022)   Social Connection and Isolation Panel [NHANES]    Frequency of Communication with Friends and Family: Twice a week    Frequency of Social Gatherings with Friends and Family: Once a week    Attends Religious Services: More than 4 times per year    Active Member of [employer] or Organizations: No    Attends Archivist Meetings: Never    Marital Status: Married  Human resources officer Violence: Not At Risk (08/29/2022)   Humiliation, Afraid, Rape, and Kick questionnaire    Fear of Current or Ex-Partner: No    Emotionally Abused: No    Physically Abused: No    Sexually Abused: No    Family History  Problem Relation Age of Onset   Diabetes Mother    Kidney disease Mother    Diabetes Father    Coronary artery disease Father    Diabetes Brother    Diabetes Sister    Prostate cancer Maternal Uncle    Diabetes Maternal Grandmother    Cancer Paternal Grandmother        unknown   Heart attack Paternal Grandfather    Colon cancer Neg Hx     ROS: no fevers or chills, productive cough, hemoptysis, dysphasia, odynophagia, melena, hematochezia, dysuria, hematuria, rash, seizure activity, orthopnea, PND, pedal edema, claudication.  Remaining systems are negative.  Physical  Exam: Well-developed well-nourished in no acute distress.  Skin is warm and dry.  HEENT is normal.  Neck is supple.  Chest is clear to auscultation with normal expansion.  Cardiovascular exam is regular rate and rhythm.  Abdominal exam nontender or distended. No masses palpated. Extremities show no edema. neuro grossly intact  ECG- personally reviewed  A/P  1 permanent atrial fibrillation-patient remains in atrial fibrillation.  Continue low-dose beta-blocker for rate control.  Continue apixaban.  2 coronary artery disease-patient doing well with no chest pain.  Continue Crestor.  He is not on aspirin given need for apixaban.  3 hypertension-blood pressure controlled.  Continue present medical regimen.  4 hyperlipidemia-continue Crestor.  5 chronic diastolic congestive heart failure-volume is managed with dialysis.  6 end-stage renal disease-dialysis per nephrology.  [name], MD

## 2022-10-20 NOTE — Progress Notes (Addendum)
[facility] Progress Note  Subjective: pt seen in room, pt is obtunded and minimally responsive  Vitals:   2022/10/17 0500 10-17-2022 0600 10/17/22 0700 10/17/22 0730  BP:  95/74 (!) 96/59 111/64  Pulse: 83 84 84 82  Resp: (!) 33 (!) 28 (!) 29 (!) 32  Temp:    97.6 F (36.4 C)  TempSrc:    Oral  SpO2: 96% 95% 95% 95%  Weight: 103.1 kg     Height:        Exam: General: obtunded, lying in bed, ^wob w/ poor resp effort  HEENT: anicteric sclera, MMM CV: normal rate, no murmurs, trace-1+ edema x 4 ext Lungs: bilateral chest rise, normal wob Abd: soft, non-tender, non-distended Skin: diffuse livedo appearance of bilat LE's Neuro: normal speech, no gross focal deficits     OP HD: AF TTS  4h  450/1.5   101kg  3K/2.5 bath   - mircera 120 q2 last 11/16 due 11/30 - hectorol 6 ug IV tiw    Assessment/ Plan: Septic Shock/PNA - on antibiotics and pressors per primary team. ESRD- usual HD TTS.   Anemia of Chronic Kidney Disease: Hemoglobin 9-11 here Vascular access - TDC in place EOL - I know the family and have d/w pt's family at bedside today, I recommended changing to a comfort-oriented approach as he looks very ill and is in a lot of pain. They are agreeable to transitioning to comfort care. Have d/w CCM as well.   Louis [name] 10-17-22, 12:30 PM   Recent Labs  Lab 10/08/2022 2200 10/15/22 0543 10/15/22 1555 10/15/22 1750 2022/10/17 0537  HGB 9.4*   < > 10.5*  --  10.5*  ALBUMIN 2.0*  --   --   --  1.7*  CALCIUM 8.6*   < >  --  8.9 8.8*  PHOS 3.9  --   --   --  5.9*  CREATININE 3.80*   < >  --  4.79* 5.01*  K 4.6   < >  --  5.3* 5.1   < > = values in this interval not displayed.   No results for input(s): "IRON", "TIBC", "FERRITIN" in the last 168 hours. Inpatient medications:  Chlorhexidine Gluconate Cloth  6 each Topical Daily   cholecalciferol  2,000 Units Oral Daily   [START ON 10/17/2022] doxercalciferol  6 mcg Intravenous Q T,Th,Sa-HD   insulin  aspart  0-15 Units Subcutaneous TID WC   insulin aspart  0-5 Units Subcutaneous QHS   midodrine  10 mg Oral Q8H   ondansetron (ZOFRAN) IV  4 mg Intravenous Once   mouth rinse  15 mL Mouth Rinse 4 times per day   pantoprazole  40 mg Oral Daily   polyethylene glycol  17 g Oral Daily   rosuvastatin  10 mg Oral Daily   senna  2 tablet Oral Daily    albumin human     anticoagulant sodium citrate     norepinephrine (LEVOPHED) Adult infusion 11 mcg/min (17-Oct-2022 0700)   piperacillin-tazobactam (ZOSYN)  IV Stopped (10/17/22 0201)   alteplase, anticoagulant sodium citrate, docusate sodium, fentaNYL (SUBLIMAZE) injection, heparin, lactulose, lidocaine (PF), lidocaine-prilocaine, mouth rinse, pentafluoroprop-tetrafluoroeth, traMADol

## 2022-10-20 DEATH — deceased

## 2022-10-25 ENCOUNTER — Ambulatory Visit: Payer: Medicare Other | Admitting: Family Medicine
